# Patient Record
Sex: Male | Born: 1945
Health system: Southern US, Community
[De-identification: ages and names within clinical notes are randomized; demographics above are authoritative.]

## PROBLEM LIST (undated history)

## (undated) DIAGNOSIS — D696 Thrombocytopenia, unspecified: Secondary | ICD-10-CM

## (undated) DIAGNOSIS — G5601 Carpal tunnel syndrome, right upper limb: Secondary | ICD-10-CM

## (undated) DIAGNOSIS — I4891 Unspecified atrial fibrillation: Secondary | ICD-10-CM

## (undated) DIAGNOSIS — I9789 Other postprocedural complications and disorders of the circulatory system, not elsewhere classified: Secondary | ICD-10-CM

## (undated) DIAGNOSIS — I25110 Atherosclerotic heart disease of native coronary artery with unstable angina pectoris: Secondary | ICD-10-CM

## (undated) DIAGNOSIS — Z8679 Personal history of other diseases of the circulatory system: Secondary | ICD-10-CM

## (undated) DIAGNOSIS — R0683 Snoring: Secondary | ICD-10-CM

## (undated) DIAGNOSIS — Q231 Congenital insufficiency of aortic valve: Secondary | ICD-10-CM

## (undated) DIAGNOSIS — M1990 Unspecified osteoarthritis, unspecified site: Secondary | ICD-10-CM

## (undated) DIAGNOSIS — Z951 Presence of aortocoronary bypass graft: Secondary | ICD-10-CM

## (undated) DIAGNOSIS — K5792 Diverticulitis of intestine, part unspecified, without perforation or abscess without bleeding: Secondary | ICD-10-CM

## (undated) DIAGNOSIS — E119 Type 2 diabetes mellitus without complications: Secondary | ICD-10-CM

## (undated) DIAGNOSIS — K219 Gastro-esophageal reflux disease without esophagitis: Secondary | ICD-10-CM

## (undated) DIAGNOSIS — I10 Essential (primary) hypertension: Secondary | ICD-10-CM

## (undated) DIAGNOSIS — Z9889 Other specified postprocedural states: Secondary | ICD-10-CM

## (undated) DIAGNOSIS — N400 Enlarged prostate without lower urinary tract symptoms: Secondary | ICD-10-CM

## (undated) DIAGNOSIS — M109 Gout, unspecified: Secondary | ICD-10-CM

## (undated) DIAGNOSIS — G2581 Restless legs syndrome: Secondary | ICD-10-CM

## (undated) DIAGNOSIS — E785 Hyperlipidemia, unspecified: Secondary | ICD-10-CM

## (undated) DIAGNOSIS — I350 Nonrheumatic aortic (valve) stenosis: Secondary | ICD-10-CM

## (undated) DIAGNOSIS — Q2381 Bicuspid aortic valve: Secondary | ICD-10-CM

## (undated) DIAGNOSIS — Z953 Presence of xenogenic heart valve: Secondary | ICD-10-CM

## (undated) DIAGNOSIS — R011 Cardiac murmur, unspecified: Secondary | ICD-10-CM

## (undated) DIAGNOSIS — I712 Thoracic aortic aneurysm, without rupture: Secondary | ICD-10-CM

## (undated) HISTORY — PX: TONSILLECTOMY: SUR1361

## (undated) HISTORY — DX: Carpal tunnel syndrome, right upper limb: G56.01

## (undated) HISTORY — PX: TRIGGER FINGER RELEASE: SHX641

## (undated) HISTORY — PX: WRIST FRACTURE SURGERY: SHX121

## (undated) HISTORY — PX: COLONOSCOPY: SHX174

## (undated) HISTORY — DX: Diverticulitis of intestine, part unspecified, without perforation or abscess without bleeding: K57.92

## (undated) HISTORY — PX: FRACTURE SURGERY: SHX138

---

## 1976-11-23 HISTORY — PX: CATARACT EXTRACTION W/ INTRAOCULAR LENS  IMPLANT, BILATERAL: SHX1307

## 1996-11-23 HISTORY — PX: INGUINAL HERNIA REPAIR: SUR1180

## 2003-11-24 HISTORY — PX: TRANSURETHRAL RESECTION OF PROSTATE: SHX73

## 2004-11-23 HISTORY — PX: SHOULDER OPEN ROTATOR CUFF REPAIR: SHX2407

## 2005-11-23 HISTORY — PX: PENECTOMY: SHX741

## 2006-10-08 ENCOUNTER — Ambulatory Visit (HOSPITAL_BASED_OUTPATIENT_CLINIC_OR_DEPARTMENT_OTHER): Admission: RE | Admit: 2006-10-08 | Discharge: 2006-10-08 | Payer: Self-pay | Admitting: Urology

## 2006-11-23 HISTORY — PX: LIPOMA EXCISION: SHX5283

## 2007-11-24 HISTORY — PX: PENILE PROSTHESIS IMPLANT: SHX240

## 2008-02-22 ENCOUNTER — Ambulatory Visit (HOSPITAL_BASED_OUTPATIENT_CLINIC_OR_DEPARTMENT_OTHER): Admission: RE | Admit: 2008-02-22 | Discharge: 2008-02-22 | Payer: Self-pay | Admitting: Urology

## 2011-04-07 NOTE — Op Note (Signed)
NAME:  Scott [name], Scott [name]              ACCOUNT NO.:  [id number]   MEDICAL RECORD NO.:  [id number]          PATIENT TYPE:  AMB   LOCATION:  [facility]                         FACILITY:  [facility]   PHYSICIAN:  Mark C. Vernie Ammons, M.D.  DATE OF BIRTH:  [birth year]   DATE OF PROCEDURE:  02/22/2008  DATE OF DISCHARGE:                               OPERATIVE REPORT   PREOPERATIVE DIAGNOSES:  1. Organic erectile dysfunction.  2. Peyronie's disease.  3. Right hydrocele.   POSTOPERATIVE DIAGNOSES:  1. Organic erectile dysfunction.  2. Peyronie's disease.  3. Right hydrocele.   PROCEDURE:  1. Right hydrocelectomy.  2. Insertion of three-piece inflatable penile prosthesis.   SURGEON:  Mark C. Vernie Ammons, M.D.   ANESTHESIA:  General.   DRAINS:  A 16-French Foley catheter.   BLOOD LOSS:  Approximately 100 mL.   DEVICE:  Titan three-piece inflatable penile prosthesis with 16 cm  cylinders and 3 cm rear tip extenders on each side and a 75 mL  reservoir.   COMPLICATIONS:  None.   INDICATIONS:  The patient is a 65 year old white male who has organic  erectile dysfunction.  He has tried various nonsurgical methods without  success in achieving a satisfactory erection.  He has undergone prior  surgery for Peyronie's disease with a plaque incision and patch graft  and desires a penile prosthesis at this time.  He understands the risks,  complications and alternatives and elected to proceed.   DESCRIPTION OF OPERATION:  After informed consent, the patient was  brought to the major OR, placed on the table, administered general  anesthesia, and then the genitalia was sterilely prepped and draped.  He  received intravenous antibiotics preoperatively.  An official time-out  was then performed.   A 16-French Foley catheter was placed in the bladder and midline median  raphe scrotal incision was then made and carried down to expose the  hydrocele sac on the right-hand side.  This was dissected  circumferentially and delivered through the scrotal incision and opened.  Clear amber fluid was drained and the excess parietal tunica vaginalis  was excised.  I then fulgurated the edges and oversewed the edges with  running 3-0 chromic suture in a locking fashion.  This controlled any  bleeding.  I then replaced the right testicle in its normal anatomic  position.   I then dissected the tissue from the each side of the corpora, both on  right and left sides and placed stay sutures in the corpora.  Corporotomies were then made in the corpora and lengthened with the  curved Mayo scissors.  Sissy Hoff sounds were then used to dilate the  corpora both proximally and distally from 9 up to 14 cm without  difficulty.  This was performed on first the right and then the left  side.  Both sides dilated easily despite the fact that he has a history  of Peyronie's disease.  I then measured each corpus cavernosum and both  were found to be 11 cm proximally and 8 cm distally.  Both corpora were  then irrigated copiously with antibiotic solution.   A  75 mL reservoir was then selected and I dissected up to the area of  the external inguinal ring on the right-hand side.  The cord was  palpated and noted to be lateral to the opening of the medial aspect of  the ring which was pierced with a hemostat and developed behind the  symphysis pubis in the space of Retzius using blunt technique after  completely emptying the bladder through the Foley catheter.  I then  placed the 75 mL reservoir within this space after irrigating it with  antibiotic solution and filled the reservoir was 75 mL of sterile saline  and placed a shod stat on the tubing.   Attention was then redirected to the corporotomies and the [facility]  inserter was then used to place the suture through the distal aspect of  the penis through the corpora on each side and 3 cm rear tip extenders  were then attached to the cylinders, and these were  placed proximally on  each side and then the cylinders were directed distally using the  affixed string into the distal aspect of the penis.  I then filled the  device completely to its maximum and noted a good straight erection with  no kinking of the cylinders and both cylinders being noted to be located  in an equidistant position just beneath the glans.  I irrigated again  with antibiotic solution and closed the corporotomies with running 3-0  PDS suture.  I then attached the black tubing from the pump to the black  tubing of the reservoir in the standard fashion using the supplied  coupling device and excising the excess tubing in doing this.  I then  cycled the device a second time with the pump and found it to work  properly.  I then irrigated again with antibiotic solution.   A scrotal pocket was then developed using blunt technique in the right  hemiscrotum and the pump was placed in this location.  I then irrigated  one final time with antibiotic solution and closed the incision by first  reapproximating the deep scrotal tissue with running 3-0 chromic suture.  The superficial scrotal tissue was then reapproximated for a second  layer using running 3-0 chromic and the skin reapproximated with running  3-0 chromic as well.  The skin was sealed with Dermabond and fluffed  Kerlix and a scrotal support were applied.  The patient was awakened and  taken to recovery room in stable satisfactory condition.  He tolerated  the procedure well.  There were no intraoperative complications.   He will be given a prescription for 40 Tylox upon discharge, as well as  maintained on doxycycline 100 mg b.i.d. for 5 days.  He will be observed  overnight and discharged in the morning, and his Foley catheter will be  removed at that time.      Mark C. Vernie Ammons, M.D.  Electronically Signed     MCO/MEDQ  D:  02/22/2008  T:  02/23/2008  Job:  [id number]

## 2011-04-10 NOTE — Op Note (Signed)
NAMEDILLIAN, Scott [name]              ACCOUNT NO.:  [id number]   MEDICAL RECORD NO.:  [id number]          PATIENT TYPE:  AMB   LOCATION:  [facility]                         FACILITY:  [facility]   PHYSICIAN:  Mark C. Vernie Ammons, M.D.  DATE OF BIRTH:  Apr 18, 1946   DATE OF PROCEDURE:  10/08/2006  DATE OF DISCHARGE:                                 OPERATIVE REPORT   PREOPERATIVE DIAGNOSIS:  Penile curvature secondary to Peyronie's plaque.   POSTOPERATIVE DIAGNOSIS:  Penile curvature secondary to Peyronie's plaque.   PROCEDURE:  Peyronie's plaque incision and patch grafting.   SURGEON:  Dr. Vernie Ammons.   ASSISTANTS:  Dr. Sherron Monday and Dr. Wilson Singer   ANESTHESIA:  General with local supplement.   SPECIMENS:  None.   DRAINS:  None.   BLOOD LOSS:  Minimal.   COMPLICATIONS:  None.   INDICATIONS:  The patient is a 65 year old white male who has had dorsal  penile curvature secondary Peyronie's disease for approximately 1 year.  He  has no pain with erections.  He is still able to achieve a good erection but  requires Viagra.  He states the curvature has remained stable.  He is unable  to engage in intercourse with his wife due the degree of curvature which he  describes as nearly bending back to touch the abdomen.  He has also noted  preoperatively slight decrease in sensation in the penis which has resulted  in decreased ability achieving a climax.  The risks, complications, and  alternatives have been fully discussed with the patient.  He understands and  has elected to proceed with surgery.   DESCRIPTION OF OPERATION:  After informed consent, the patient brought to  the major OR, placed on the table, administered general anesthesia, after  which the genitalia was sterilely prepped and draped.  A 14-French Foley  catheter was placed in the urethra, and a circumcising incision was then  made circumferentially approximately 5 mm proximal to the corona circ, and  the penile shaft skin was then  dissected off of the penis back down to the  base of the penis.  I then placed a 1/2 inch Penrose drain around the base  of the penis as a penile tourniquet and performed an artificial erection by  using a 21 gauge butterfly needle, placed it in the corpora, and injected  sterile injectable saline into the penis.  In doing this, I noted the severe  curvature which was noted to result in dorsal curvature of the penis in the  midline, and the maximum point of curvature was then noted several  centimeters back from the glans.  This was marked with a pen.   I then incised the overlying fascia down to and incised the overlying Buck's  fascia down to the corpora at about the 3 and 9 o'clock positions several  centimeters back and extended this on up to where the circumcising incision  was then made.  Maintaining this tissue intact, I then dissected along the  corporal bodies and freed up the neurovascular bundle without difficulty,  maintaining this intact and cauterizing small emissary vessels as they  were  encountered.   I then performed an artificial erection in an identical fashion, as noted  previously.  In doing this, I noted again the area of greatest curvature.  This corresponded to the area where the Peyronie's plaque was easily  palpable.  This was marked with a marker.   An incision was then made in the plaque transversely at the location of its  greatest curvature.  This was extended laterally on either side of the  midline.  The total length of the transverse incision was approximately 4  cm.  An H incision was then made by making incisions parallel to the shaft  at each end of the incision approximately 2 cm in length on each side.  The  tips of the H then were identified and excised.  I then dissected between  the spongy tissue of the corporal bodies about 2 mm in on each side to free  up the incision.  In doing this, this opened up a wide 2 cm width defect  that was 2 cm  distal to proximal width.  Laterally it measured 4 cm.  I then  chose a portion of pericardium and cut a portion just a little bit larger  than 2 x 4 cm.  This was laid in the defect.  It was then secured at all 4  corners with interrupted 4-0 PDS suture.  I then used 4-0 PDS in a running  fashion to approximate the edge of the corporal bodies to the graft  material.  After this was completely sewn in, I then performed a final  artificial erection and noted the penis to be straight.   The penile tourniquet which had been in place during the operation was  removed and good blood flow return to the penis.  Bleeding points were  cauterized.  I then reapproximated Buck's fascia on each side using running  4-0 chromic suture.  A dorsal penile block was then performed using 0.25%  plain Marcaine, and the circumferential incision was then reapproximated  with running 4-0 chromic suture.  A Telfa dressing was placed  circumferentially around the incision, and Kerlix was loosely wrapped around  the penis and secured.  The patient was awakened and taken to the recovery  room in stable satisfactory condition.  He tolerated the procedure well.  There were no intraoperative complications.  He will be given a prescription  for Tylox #40 and take Cipro 500 mg b.i.d. x5 days.  He will return for  recheck to my office in 10 days.      Mark C. Vernie Ammons, M.D.  Electronically Signed     MCO/MEDQ  D:  10/08/2006  T:  10/08/2006  Job:  [id number]

## 2011-08-18 LAB — POCT I-STAT 4, (NA,K, GLUC, HGB,HCT)
Glucose, Bld: 110 — ABNORMAL HIGH
HCT: 45
Hemoglobin: 15.3
Operator id: 114531
Potassium: 4.2
Sodium: 141

## 2011-11-26 DIAGNOSIS — M6530 Trigger finger, unspecified finger: Secondary | ICD-10-CM | POA: Diagnosis not present

## 2011-12-29 DIAGNOSIS — I889 Nonspecific lymphadenitis, unspecified: Secondary | ICD-10-CM | POA: Diagnosis not present

## 2011-12-30 DIAGNOSIS — I10 Essential (primary) hypertension: Secondary | ICD-10-CM | POA: Diagnosis not present

## 2011-12-30 DIAGNOSIS — E7800 Pure hypercholesterolemia, unspecified: Secondary | ICD-10-CM | POA: Diagnosis not present

## 2011-12-30 DIAGNOSIS — E559 Vitamin D deficiency, unspecified: Secondary | ICD-10-CM | POA: Diagnosis not present

## 2011-12-30 DIAGNOSIS — E119 Type 2 diabetes mellitus without complications: Secondary | ICD-10-CM | POA: Diagnosis not present

## 2011-12-30 DIAGNOSIS — E538 Deficiency of other specified B group vitamins: Secondary | ICD-10-CM | POA: Diagnosis not present

## 2011-12-30 DIAGNOSIS — M109 Gout, unspecified: Secondary | ICD-10-CM | POA: Diagnosis not present

## 2012-01-04 DIAGNOSIS — I10 Essential (primary) hypertension: Secondary | ICD-10-CM | POA: Diagnosis not present

## 2012-01-04 DIAGNOSIS — S139XXA Sprain of joints and ligaments of unspecified parts of neck, initial encounter: Secondary | ICD-10-CM | POA: Diagnosis not present

## 2012-01-04 DIAGNOSIS — M109 Gout, unspecified: Secondary | ICD-10-CM | POA: Diagnosis not present

## 2012-01-04 DIAGNOSIS — G2581 Restless legs syndrome: Secondary | ICD-10-CM | POA: Diagnosis not present

## 2012-01-04 DIAGNOSIS — E7800 Pure hypercholesterolemia, unspecified: Secondary | ICD-10-CM | POA: Diagnosis not present

## 2012-01-04 DIAGNOSIS — G619 Inflammatory polyneuropathy, unspecified: Secondary | ICD-10-CM | POA: Diagnosis not present

## 2012-01-04 DIAGNOSIS — E119 Type 2 diabetes mellitus without complications: Secondary | ICD-10-CM | POA: Diagnosis not present

## 2012-01-04 DIAGNOSIS — G622 Polyneuropathy due to other toxic agents: Secondary | ICD-10-CM | POA: Diagnosis not present

## 2012-01-04 DIAGNOSIS — E781 Pure hyperglyceridemia: Secondary | ICD-10-CM | POA: Diagnosis not present

## 2012-07-04 DIAGNOSIS — N400 Enlarged prostate without lower urinary tract symptoms: Secondary | ICD-10-CM | POA: Diagnosis not present

## 2012-07-04 DIAGNOSIS — E7800 Pure hypercholesterolemia, unspecified: Secondary | ICD-10-CM | POA: Diagnosis not present

## 2012-07-04 DIAGNOSIS — E119 Type 2 diabetes mellitus without complications: Secondary | ICD-10-CM | POA: Diagnosis not present

## 2012-07-12 DIAGNOSIS — M109 Gout, unspecified: Secondary | ICD-10-CM | POA: Diagnosis not present

## 2012-07-12 DIAGNOSIS — G2581 Restless legs syndrome: Secondary | ICD-10-CM | POA: Diagnosis not present

## 2012-07-12 DIAGNOSIS — E781 Pure hyperglyceridemia: Secondary | ICD-10-CM | POA: Diagnosis not present

## 2012-07-12 DIAGNOSIS — E119 Type 2 diabetes mellitus without complications: Secondary | ICD-10-CM | POA: Diagnosis not present

## 2012-07-12 DIAGNOSIS — E7800 Pure hypercholesterolemia, unspecified: Secondary | ICD-10-CM | POA: Diagnosis not present

## 2012-07-12 DIAGNOSIS — I10 Essential (primary) hypertension: Secondary | ICD-10-CM | POA: Diagnosis not present

## 2012-09-06 DIAGNOSIS — Z23 Encounter for immunization: Secondary | ICD-10-CM | POA: Diagnosis not present

## 2012-09-27 DIAGNOSIS — K5732 Diverticulitis of large intestine without perforation or abscess without bleeding: Secondary | ICD-10-CM | POA: Diagnosis not present

## 2012-10-25 DIAGNOSIS — M76899 Other specified enthesopathies of unspecified lower limb, excluding foot: Secondary | ICD-10-CM | POA: Diagnosis not present

## 2012-10-25 DIAGNOSIS — S83289A Other tear of lateral meniscus, current injury, unspecified knee, initial encounter: Secondary | ICD-10-CM | POA: Diagnosis not present

## 2012-11-01 DIAGNOSIS — R197 Diarrhea, unspecified: Secondary | ICD-10-CM | POA: Diagnosis not present

## 2012-11-01 DIAGNOSIS — K5289 Other specified noninfective gastroenteritis and colitis: Secondary | ICD-10-CM | POA: Diagnosis not present

## 2012-12-06 DIAGNOSIS — M76899 Other specified enthesopathies of unspecified lower limb, excluding foot: Secondary | ICD-10-CM | POA: Diagnosis not present

## 2012-12-20 DIAGNOSIS — D235 Other benign neoplasm of skin of trunk: Secondary | ICD-10-CM | POA: Diagnosis not present

## 2012-12-20 DIAGNOSIS — L570 Actinic keratosis: Secondary | ICD-10-CM | POA: Diagnosis not present

## 2012-12-20 DIAGNOSIS — L219 Seborrheic dermatitis, unspecified: Secondary | ICD-10-CM | POA: Diagnosis not present

## 2012-12-20 DIAGNOSIS — L819 Disorder of pigmentation, unspecified: Secondary | ICD-10-CM | POA: Diagnosis not present

## 2012-12-20 DIAGNOSIS — D485 Neoplasm of uncertain behavior of skin: Secondary | ICD-10-CM | POA: Diagnosis not present

## 2012-12-28 DIAGNOSIS — E559 Vitamin D deficiency, unspecified: Secondary | ICD-10-CM | POA: Diagnosis not present

## 2012-12-28 DIAGNOSIS — I10 Essential (primary) hypertension: Secondary | ICD-10-CM | POA: Diagnosis not present

## 2012-12-28 DIAGNOSIS — E538 Deficiency of other specified B group vitamins: Secondary | ICD-10-CM | POA: Diagnosis not present

## 2012-12-28 DIAGNOSIS — E7800 Pure hypercholesterolemia, unspecified: Secondary | ICD-10-CM | POA: Diagnosis not present

## 2012-12-28 DIAGNOSIS — E119 Type 2 diabetes mellitus without complications: Secondary | ICD-10-CM | POA: Diagnosis not present

## 2013-01-03 DIAGNOSIS — M76899 Other specified enthesopathies of unspecified lower limb, excluding foot: Secondary | ICD-10-CM | POA: Diagnosis not present

## 2013-01-10 DIAGNOSIS — M109 Gout, unspecified: Secondary | ICD-10-CM | POA: Diagnosis not present

## 2013-01-10 DIAGNOSIS — N400 Enlarged prostate without lower urinary tract symptoms: Secondary | ICD-10-CM | POA: Diagnosis not present

## 2013-01-10 DIAGNOSIS — G2581 Restless legs syndrome: Secondary | ICD-10-CM | POA: Diagnosis not present

## 2013-01-10 DIAGNOSIS — I10 Essential (primary) hypertension: Secondary | ICD-10-CM | POA: Diagnosis not present

## 2013-01-10 DIAGNOSIS — E119 Type 2 diabetes mellitus without complications: Secondary | ICD-10-CM | POA: Diagnosis not present

## 2013-01-10 DIAGNOSIS — E7800 Pure hypercholesterolemia, unspecified: Secondary | ICD-10-CM | POA: Diagnosis not present

## 2013-01-10 DIAGNOSIS — E781 Pure hyperglyceridemia: Secondary | ICD-10-CM | POA: Diagnosis not present

## 2013-01-17 DIAGNOSIS — H2510 Age-related nuclear cataract, unspecified eye: Secondary | ICD-10-CM | POA: Diagnosis not present

## 2013-04-24 DIAGNOSIS — N400 Enlarged prostate without lower urinary tract symptoms: Secondary | ICD-10-CM | POA: Diagnosis not present

## 2013-04-24 DIAGNOSIS — M545 Low back pain: Secondary | ICD-10-CM | POA: Diagnosis not present

## 2013-05-02 DIAGNOSIS — N400 Enlarged prostate without lower urinary tract symptoms: Secondary | ICD-10-CM | POA: Diagnosis not present

## 2013-07-11 DIAGNOSIS — I10 Essential (primary) hypertension: Secondary | ICD-10-CM | POA: Diagnosis not present

## 2013-07-11 DIAGNOSIS — E119 Type 2 diabetes mellitus without complications: Secondary | ICD-10-CM | POA: Diagnosis not present

## 2013-07-11 DIAGNOSIS — E7800 Pure hypercholesterolemia, unspecified: Secondary | ICD-10-CM | POA: Diagnosis not present

## 2013-07-11 DIAGNOSIS — N400 Enlarged prostate without lower urinary tract symptoms: Secondary | ICD-10-CM | POA: Diagnosis not present

## 2013-07-18 DIAGNOSIS — N400 Enlarged prostate without lower urinary tract symptoms: Secondary | ICD-10-CM | POA: Diagnosis not present

## 2013-07-18 DIAGNOSIS — E119 Type 2 diabetes mellitus without complications: Secondary | ICD-10-CM | POA: Diagnosis not present

## 2013-07-18 DIAGNOSIS — E7800 Pure hypercholesterolemia, unspecified: Secondary | ICD-10-CM | POA: Diagnosis not present

## 2013-07-18 DIAGNOSIS — M109 Gout, unspecified: Secondary | ICD-10-CM | POA: Diagnosis not present

## 2013-07-18 DIAGNOSIS — I10 Essential (primary) hypertension: Secondary | ICD-10-CM | POA: Diagnosis not present

## 2013-07-18 DIAGNOSIS — E781 Pure hyperglyceridemia: Secondary | ICD-10-CM | POA: Diagnosis not present

## 2013-07-18 DIAGNOSIS — G2581 Restless legs syndrome: Secondary | ICD-10-CM | POA: Diagnosis not present

## 2013-09-01 DIAGNOSIS — Z23 Encounter for immunization: Secondary | ICD-10-CM | POA: Diagnosis not present

## 2013-09-05 DIAGNOSIS — G5600 Carpal tunnel syndrome, unspecified upper limb: Secondary | ICD-10-CM | POA: Diagnosis not present

## 2013-09-19 DIAGNOSIS — G5600 Carpal tunnel syndrome, unspecified upper limb: Secondary | ICD-10-CM | POA: Diagnosis not present

## 2013-09-26 DIAGNOSIS — G5600 Carpal tunnel syndrome, unspecified upper limb: Secondary | ICD-10-CM | POA: Diagnosis not present

## 2013-11-18 DIAGNOSIS — J111 Influenza due to unidentified influenza virus with other respiratory manifestations: Secondary | ICD-10-CM | POA: Diagnosis not present

## 2013-11-21 ENCOUNTER — Encounter (HOSPITAL_BASED_OUTPATIENT_CLINIC_OR_DEPARTMENT_OTHER): Payer: Self-pay | Admitting: *Deleted

## 2013-11-21 NOTE — Progress Notes (Signed)
11/21/13 1633  OBSTRUCTIVE SLEEP APNEA  Have you ever been diagnosed with sleep apnea through a sleep study? No  Do you snore loudly (loud enough to be heard through closed doors)?  1  Do you often feel tired, fatigued, or sleepy during the daytime? 1  Has anyone observed you stop breathing during your sleep? 1  Do you have, or are you being treated for high blood pressure? 1  BMI more than 35 kg/m2? 0  Age over 67 years old? 1  Gender: 1  Obstructive Sleep Apnea Score 6  Score 4 or greater  Results sent to PCP

## 2013-11-21 NOTE — Progress Notes (Signed)
Pt snores and may have apnea-he has never had to see cardiology but was sent for echo several yr ago for a soft murmur- Denies any resp problems Will need ekg istat

## 2013-11-28 ENCOUNTER — Encounter (HOSPITAL_BASED_OUTPATIENT_CLINIC_OR_DEPARTMENT_OTHER): Payer: Medicare Other | Admitting: Anesthesiology

## 2013-11-28 ENCOUNTER — Encounter (HOSPITAL_BASED_OUTPATIENT_CLINIC_OR_DEPARTMENT_OTHER)
Admission: RE | Disposition: A | Discharge status: Home / Self Care | Payer: Self-pay | Source: Ambulatory Visit | Attending: Orthopedic Surgery

## 2013-11-28 ENCOUNTER — Other Ambulatory Visit: Payer: Self-pay

## 2013-11-28 ENCOUNTER — Encounter (HOSPITAL_BASED_OUTPATIENT_CLINIC_OR_DEPARTMENT_OTHER): Payer: Self-pay | Admitting: Anesthesiology

## 2013-11-28 ENCOUNTER — Ambulatory Visit (HOSPITAL_BASED_OUTPATIENT_CLINIC_OR_DEPARTMENT_OTHER): Payer: Medicare Other | Admitting: Anesthesiology

## 2013-11-28 ENCOUNTER — Encounter (HOSPITAL_BASED_OUTPATIENT_CLINIC_OR_DEPARTMENT_OTHER): Payer: Self-pay | Admitting: *Deleted

## 2013-11-28 ENCOUNTER — Other Ambulatory Visit: Payer: Self-pay | Admitting: Physician Assistant

## 2013-11-28 ENCOUNTER — Encounter: Payer: Self-pay | Admitting: Physician Assistant

## 2013-11-28 ENCOUNTER — Ambulatory Visit (HOSPITAL_BASED_OUTPATIENT_CLINIC_OR_DEPARTMENT_OTHER)
Admission: RE | Admit: 2013-11-28 | Discharge: 2013-11-28 | Disposition: A | Discharge status: Home / Self Care | Payer: Medicare Other | Source: Ambulatory Visit | Attending: Orthopedic Surgery | Admitting: Orthopedic Surgery

## 2013-11-28 DIAGNOSIS — N401 Enlarged prostate with lower urinary tract symptoms: Secondary | ICD-10-CM | POA: Diagnosis not present

## 2013-11-28 DIAGNOSIS — I10 Essential (primary) hypertension: Secondary | ICD-10-CM | POA: Diagnosis not present

## 2013-11-28 DIAGNOSIS — M109 Gout, unspecified: Secondary | ICD-10-CM | POA: Insufficient documentation

## 2013-11-28 DIAGNOSIS — R351 Nocturia: Secondary | ICD-10-CM | POA: Insufficient documentation

## 2013-11-28 DIAGNOSIS — G5600 Carpal tunnel syndrome, unspecified upper limb: Secondary | ICD-10-CM | POA: Insufficient documentation

## 2013-11-28 DIAGNOSIS — M65341 Trigger finger, right ring finger: Secondary | ICD-10-CM | POA: Insufficient documentation

## 2013-11-28 DIAGNOSIS — G2581 Restless legs syndrome: Secondary | ICD-10-CM | POA: Diagnosis not present

## 2013-11-28 DIAGNOSIS — R0683 Snoring: Secondary | ICD-10-CM | POA: Insufficient documentation

## 2013-11-28 DIAGNOSIS — M6530 Trigger finger, unspecified finger: Secondary | ICD-10-CM | POA: Diagnosis not present

## 2013-11-28 DIAGNOSIS — G5601 Carpal tunnel syndrome, right upper limb: Secondary | ICD-10-CM

## 2013-11-28 DIAGNOSIS — N138 Other obstructive and reflux uropathy: Secondary | ICD-10-CM | POA: Insufficient documentation

## 2013-11-28 DIAGNOSIS — E785 Hyperlipidemia, unspecified: Secondary | ICD-10-CM | POA: Diagnosis not present

## 2013-11-28 DIAGNOSIS — M1990 Unspecified osteoarthritis, unspecified site: Secondary | ICD-10-CM | POA: Insufficient documentation

## 2013-11-28 DIAGNOSIS — Z973 Presence of spectacles and contact lenses: Secondary | ICD-10-CM | POA: Insufficient documentation

## 2013-11-28 HISTORY — PX: TRIGGER FINGER RELEASE: SHX641

## 2013-11-28 HISTORY — DX: Unspecified osteoarthritis, unspecified site: M19.90

## 2013-11-28 HISTORY — DX: Essential (primary) hypertension: I10

## 2013-11-28 HISTORY — DX: Restless legs syndrome: G25.81

## 2013-11-28 HISTORY — DX: Snoring: R06.83

## 2013-11-28 HISTORY — DX: Gout, unspecified: M10.9

## 2013-11-28 HISTORY — DX: Benign prostatic hyperplasia without lower urinary tract symptoms: N40.0

## 2013-11-28 HISTORY — DX: Hyperlipidemia, unspecified: E78.5

## 2013-11-28 HISTORY — PX: CARPAL TUNNEL RELEASE: SHX101

## 2013-11-28 LAB — POCT I-STAT, CHEM 8
BUN: 16 mg/dL (ref 6–23)
CHLORIDE: 105 meq/L (ref 96–112)
Calcium, Ion: 1.22 mmol/L (ref 1.13–1.30)
Creatinine, Ser: 0.8 mg/dL (ref 0.50–1.35)
Glucose, Bld: 121 mg/dL — ABNORMAL HIGH (ref 70–99)
HEMATOCRIT: 46 % (ref 39.0–52.0)
Hemoglobin: 15.6 g/dL (ref 13.0–17.0)
POTASSIUM: 3.7 meq/L (ref 3.7–5.3)
Sodium: 140 mEq/L (ref 137–147)
TCO2: 24 mmol/L (ref 0–100)

## 2013-11-28 SURGERY — CARPAL TUNNEL RELEASE
Anesthesia: General | Site: Wrist | Laterality: Right

## 2013-11-28 MED ORDER — CHLORHEXIDINE GLUCONATE 4 % EX LIQD: 60.0000 mL | Freq: Once | CUTANEOUS | Status: DC

## 2013-11-28 MED ORDER — MIDAZOLAM HCL 2 MG/2ML IJ SOLN
INTRAMUSCULAR | Status: AC
  Filled 2013-11-28: qty 2

## 2013-11-28 MED ORDER — FENTANYL CITRATE 0.05 MG/ML IJ SOLN: 50.0000 ug | Freq: Once | INTRAMUSCULAR | Status: DC

## 2013-11-28 MED ORDER — 0.9 % SODIUM CHLORIDE (POUR BTL) OPTIME
TOPICAL | Status: DC | PRN
  Administered 2013-11-28: 250 mL

## 2013-11-28 MED ORDER — HYDROCODONE-ACETAMINOPHEN 5-325 MG PO TABS: ORAL_TABLET | ORAL | Status: DC

## 2013-11-28 MED ORDER — PROPOFOL 10 MG/ML IV BOLUS
INTRAVENOUS | Status: AC
  Filled 2013-11-28: qty 20

## 2013-11-28 MED ORDER — OXYCODONE HCL 5 MG PO TABS: 5.0000 mg | ORAL_TABLET | Freq: Once | ORAL | Status: DC | PRN

## 2013-11-28 MED ORDER — MIDAZOLAM HCL 5 MG/5ML IJ SOLN
INTRAMUSCULAR | Status: DC | PRN
  Administered 2013-11-28: 2 mg via INTRAVENOUS

## 2013-11-28 MED ORDER — HYDROMORPHONE HCL PF 1 MG/ML IJ SOLN: 0.2500 mg | INTRAMUSCULAR | Status: DC | PRN

## 2013-11-28 MED ORDER — LIDOCAINE HCL (CARDIAC) 20 MG/ML IV SOLN
INTRAVENOUS | Status: DC | PRN
  Administered 2013-11-28: 80 mg via INTRAVENOUS

## 2013-11-28 MED ORDER — OXYCODONE HCL 5 MG/5ML PO SOLN: 5.0000 mg | Freq: Once | ORAL | Status: DC | PRN

## 2013-11-28 MED ORDER — FENTANYL CITRATE 0.05 MG/ML IJ SOLN
INTRAMUSCULAR | Status: AC
  Filled 2013-11-28: qty 4

## 2013-11-28 MED ORDER — ONDANSETRON HCL 4 MG/2ML IJ SOLN
INTRAMUSCULAR | Status: DC | PRN
  Administered 2013-11-28: 4 mg via INTRAVENOUS

## 2013-11-28 MED ORDER — LACTATED RINGERS IV SOLN: INTRAVENOUS | Status: DC

## 2013-11-28 MED ORDER — BUPIVACAINE HCL (PF) 0.25 % IJ SOLN
INTRAMUSCULAR | Status: DC | PRN
  Administered 2013-11-28: 10 mL

## 2013-11-28 MED ORDER — LACTATED RINGERS IV SOLN
INTRAVENOUS | Status: DC
  Administered 2013-11-28 (×2): via INTRAVENOUS

## 2013-11-28 MED ORDER — FENTANYL CITRATE 0.05 MG/ML IJ SOLN
INTRAMUSCULAR | Status: DC | PRN
  Administered 2013-11-28 (×2): 50 ug via INTRAVENOUS

## 2013-11-28 MED ORDER — VANCOMYCIN HCL IN DEXTROSE 1-5 GM/200ML-% IV SOLN
INTRAVENOUS | Status: AC
  Filled 2013-11-28: qty 200

## 2013-11-28 MED ORDER — PROPOFOL 10 MG/ML IV BOLUS
INTRAVENOUS | Status: DC | PRN
  Administered 2013-11-28: 150 mg via INTRAVENOUS

## 2013-11-28 MED ORDER — VANCOMYCIN HCL IN DEXTROSE 1-5 GM/200ML-% IV SOLN
1000.0000 mg | INTRAVENOUS | Status: AC
  Administered 2013-11-28: 1000 mg via INTRAVENOUS

## 2013-11-28 MED ORDER — DEXAMETHASONE SODIUM PHOSPHATE 4 MG/ML IJ SOLN
INTRAMUSCULAR | Status: DC | PRN
  Administered 2013-11-28: 10 mg via INTRAVENOUS

## 2013-11-28 MED ORDER — MIDAZOLAM HCL 2 MG/2ML IJ SOLN: 1.0000 mg | INTRAMUSCULAR | Status: DC | PRN

## 2013-11-28 MED ORDER — FENTANYL CITRATE 0.05 MG/ML IJ SOLN: 50.0000 ug | INTRAMUSCULAR | Status: DC | PRN

## 2013-11-28 MED ORDER — PROMETHAZINE HCL 25 MG/ML IJ SOLN: 6.2500 mg | INTRAMUSCULAR | Status: DC | PRN

## 2013-11-28 SURGICAL SUPPLY — 51 items
BANDAGE ELASTIC 3 VELCRO ST LF (GAUZE/BANDAGES/DRESSINGS) ×4 IMPLANT
BLADE MINI RND TIP GREEN BEAV (BLADE) IMPLANT
BLADE SURG 15 STRL LF DISP TIS (BLADE) ×4 IMPLANT
BLADE SURG 15 STRL SS (BLADE) ×8
BNDG CMPR 9X4 STRL LF SNTH (GAUZE/BANDAGES/DRESSINGS) ×2
BNDG COHESIVE 3X5 TAN STRL LF (GAUZE/BANDAGES/DRESSINGS) ×4 IMPLANT
BNDG COHESIVE 4X5 TAN STRL (GAUZE/BANDAGES/DRESSINGS) ×4 IMPLANT
BNDG ESMARK 4X9 LF (GAUZE/BANDAGES/DRESSINGS) ×4 IMPLANT
COVER TABLE BACK 60X90 (DRAPES) ×4 IMPLANT
CUFF TOURNIQUET SINGLE 18IN (TOURNIQUET CUFF) ×4 IMPLANT
DRAPE EXTREMITY T 121X128X90 (DRAPE) ×4 IMPLANT
DRAPE SURG 17X23 STRL (DRAPES) ×4 IMPLANT
DRAPE U 20/CS (DRAPES) ×4 IMPLANT
DRAPE U-SHAPE 47X51 STRL (DRAPES) ×4 IMPLANT
DRSG EMULSION OIL 3X3 NADH (GAUZE/BANDAGES/DRESSINGS) IMPLANT
DURAPREP 26ML APPLICATOR (WOUND CARE) ×4 IMPLANT
ELECT NDL TIP 2.8 STRL (NEEDLE) IMPLANT
ELECT NEEDLE TIP 2.8 STRL (NEEDLE) IMPLANT
ELECT REM PT RETURN 9FT ADLT (ELECTROSURGICAL) ×4
ELECTRODE REM PT RTRN 9FT ADLT (ELECTROSURGICAL) ×2 IMPLANT
GAUZE XEROFORM 1X8 LF (GAUZE/BANDAGES/DRESSINGS) ×4 IMPLANT
GLOVE BIO SURGEON STRL SZ7 (GLOVE) ×4 IMPLANT
GLOVE BIOGEL PI IND STRL 7.0 (GLOVE) ×2 IMPLANT
GLOVE BIOGEL PI IND STRL 7.5 (GLOVE) ×2 IMPLANT
GLOVE BIOGEL PI INDICATOR 7.0 (GLOVE) ×6
GLOVE BIOGEL PI INDICATOR 7.5 (GLOVE) ×2
GLOVE SS BIOGEL STRL SZ 7.5 (GLOVE) ×2 IMPLANT
GLOVE SUPERSENSE BIOGEL SZ 7.5 (GLOVE) ×2
GLOVE SURG SS PI 7.0 STRL IVOR (GLOVE) ×8 IMPLANT
GOWN STRL REIN XL XLG (GOWN DISPOSABLE) ×4 IMPLANT
GOWN STRL REUS W/ TWL LRG LVL3 (GOWN DISPOSABLE) ×2 IMPLANT
GOWN STRL REUS W/TWL LRG LVL3 (GOWN DISPOSABLE) ×4
NDL HYPO 25X1 1.5 SAFETY (NEEDLE) ×2 IMPLANT
NEEDLE HYPO 25X1 1.5 SAFETY (NEEDLE) ×4 IMPLANT
NS IRRIG 1000ML POUR BTL (IV SOLUTION) ×4 IMPLANT
PACK BASIN DAY SURGERY FS (CUSTOM PROCEDURE TRAY) ×4 IMPLANT
PAD CAST 3X4 CTTN HI CHSV (CAST SUPPLIES) ×2 IMPLANT
PADDING CAST ABS 4INX4YD NS (CAST SUPPLIES) ×2
PADDING CAST ABS COTTON 4X4 ST (CAST SUPPLIES) ×2 IMPLANT
PADDING CAST COTTON 3X4 STRL (CAST SUPPLIES) ×4
PENCIL BUTTON HOLSTER BLD 10FT (ELECTRODE) ×4 IMPLANT
SPONGE GAUZE 4X4 12PLY (GAUZE/BANDAGES/DRESSINGS) ×4 IMPLANT
SPONGE GAUZE 4X4 12PLY STER LF (GAUZE/BANDAGES/DRESSINGS) ×6 IMPLANT
STOCKINETTE 4X48 STRL (DRAPES) ×4 IMPLANT
SUT ETHILON 4 0 PS 2 18 (SUTURE) ×4 IMPLANT
SUT ETHILON 5 0 P 3 18 (SUTURE)
SUT NYLON ETHILON 5-0 P-3 1X18 (SUTURE) IMPLANT
SYR BULB 3OZ (MISCELLANEOUS) ×4 IMPLANT
SYR CONTROL 10ML LL (SYRINGE) ×4 IMPLANT
TOWEL OR 17X24 6PK STRL BLUE (TOWEL DISPOSABLE) ×8 IMPLANT
UNDERPAD 30X30 INCONTINENT (UNDERPADS AND DIAPERS) ×4 IMPLANT

## 2013-11-28 NOTE — H&P (View-Only) (Signed)
Scott [name] is an 68 y.o. male.   Chief Complaint: right hand pain, numbness, tingling with fourth finger catching HPI: Scott [name] is a 68 year old seen for followup evaluation from his right carpal tunnel syndrome.  He underwent EMG, PNCV's  on 09/19/2013 that revealed moderate to severe right carpal tunnel syndrome.  He continues to have pain and numbness.  He is also having triggering of his right fourth finger.  He has previously undergone trigger finger releases in his right second and third fingers a number of years ago.  Past Medical History  Diagnosis Date  . Hypertension   . BPH (benign prostatic hypertrophy)   . Nocturia   . Gout   . Arthritis   . RLS (restless legs syndrome)   . Snores   . Hyperlipemia   . Wears glasses   . Carpal tunnel syndrome of right wrist   . Trigger ring finger of right hand     Past Surgical History  Procedure Laterality Date  . Tonsillectomy    . Eye surgery  1978    both cataracts  . Hernia repair  1998    rt ing  . Transurethral resection of prostate  2005  . Shoulder arthroscopy w/ rotator cuff repair  2006    right  . Lipoma excision  2008    rt head  . Colonoscopy    . Trigger finger release      several lt and rt hands  . Penile prosthesis implant  2009  . Penectomy  2007    for pernones    Family History  Problem Relation Age of Onset  .      Social History:  reports that he has never smoked. He does not have any smokeless tobacco history on file. He reports that he drinks alcohol. He reports that he does not use illicit drugs.  Allergies:  Allergies  Allergen Reactions  . Doxycycline Hives  . Penicillins Hives  . Sulfa Antibiotics Hives   Current Facility-Administered Medications on File Prior to Visit  Medication Dose Route Frequency Provider Last Rate Last Dose  . fentaNYL (SUBLIMAZE) injection 50-100 mcg  50-100 mcg Intravenous PRN Napoleon Form, MD      . lactated ringers infusion   Intravenous Continuous  Napoleon Form, MD      . midazolam (VERSED) injection 1-2 mg  1-2 mg Intravenous PRN Napoleon Form, MD       Current Outpatient Prescriptions on File Prior to Visit  Medication Sig Dispense Refill  . allopurinol (ZYLOPRIM) 300 MG tablet Take 300 mg by mouth daily.      Marland Kitchen aspirin 81 MG tablet Take 81 mg by mouth daily.      . enalapril (VASOTEC) 20 MG tablet Take 20 mg by mouth 2 (two) times daily.      . finasteride (PROSCAR) 5 MG tablet Take 5 mg by mouth daily.      Marland Kitchen gemfibrozil (LOPID) 600 MG tablet Take 600 mg by mouth 2 (two) times daily before a meal.      . rOPINIRole (REQUIP) 2 MG tablet Take 2 mg by mouth at bedtime. Takes 2      . solifenacin (VESICARE) 10 MG tablet Take by mouth at bedtime.         (Not in a hospital admission)  No results found for this or any previous visit (from the past 48 hour(s)). No results found.  Review of Systems  Constitutional: Negative.   HENT: Negative.  Eyes: Negative.   Cardiovascular: Negative.   Gastrointestinal: Negative.   Genitourinary: Negative.   Skin: Negative.   Neurological: Negative.   Endo/Heme/Allergies: Negative.   Psychiatric/Behavioral: Negative.     Blood pressure 136/86, height 5\' 9"  (1.753 m), weight 95.255 kg (210 lb). Physical Exam  Constitutional: He is oriented to person, place, and time. He appears well-developed and well-nourished.  HENT:  Head: Normocephalic and atraumatic.  Mouth/Throat: Oropharynx is clear and moist.  Eyes: Conjunctivae are normal. Pupils are equal, round, and reactive to light.  Neck: Neck supple.  Cardiovascular: Normal rate.   Murmur heard. Respiratory: Effort normal.  GI: Soft.  Genitourinary:  Not pertinent to current symptomatology therefore not examined.  Musculoskeletal:  Examination of his right hand reveals positive Tinel's, decreased sensation in the median nerve distribution.  Examination of his right fourth finger reveals triggering with full range of motion.  He has  no triggering in his other fingers.    Neurological: He is alert and oriented to person, place, and time.  Skin: Skin is warm and dry.  Psychiatric: He has a normal mood and affect.     Assessment Patient Active Problem List   Diagnosis Date Noted  . Trigger ring finger of right hand   . Wears glasses   . Gout   . Hypertension    Plan I have talked to him about this in detail.  Would recommend with these findings and his persistent pain and disability that we proceed with right carpal tunnel release as well as right fourth trigger finger release.  Risks, complications and benefits of his surgery have been described to him in detail and he understands this completely.   Sharlett Lienemann J 11/28/2013, 10:35 AM

## 2013-11-28 NOTE — Anesthesia Postprocedure Evaluation (Signed)
  Anesthesia Post-op Note  Patient: Scott [name]  Procedure(s) Performed: Procedure(s): RIGHT WRIST CARPAL TUNNEL RELEASE (Right) RIGHT TRIGGER FINGER  RELEASE (TENDON SHEATH INCISION) (Right)  Patient Location: PACU  Anesthesia Type:General  Level of Consciousness: awake  Airway and Oxygen Therapy: Patient Spontanous Breathing  Post-op Pain: mild  Post-op Assessment: Post-op Vital signs reviewed, Patient's Cardiovascular Status Stable, Respiratory Function Stable, Patent Airway, No signs of Nausea or vomiting and Pain level controlled  Post-op Vital Signs: Reviewed and stable  Complications: No apparent anesthesia complications

## 2013-11-28 NOTE — Transfer of Care (Signed)
Immediate Anesthesia Transfer of Care Note  Patient: Scott [name]  Procedure(s) Performed: Procedure(s): RIGHT WRIST CARPAL TUNNEL RELEASE (Right) RIGHT TRIGGER FINGER  RELEASE (TENDON SHEATH INCISION) (Right)  Patient Location: PACU  Anesthesia Type:General  Level of Consciousness: awake, alert  and oriented  Airway & Oxygen Therapy: Patient Spontanous Breathing  Post-op Assessment: Report given to PACU RN and Post -op Vital signs reviewed and stable  Post vital signs: Reviewed and stable  Complications: No apparent anesthesia complications

## 2013-11-28 NOTE — Interval H&P Note (Signed)
History and Physical Interval Note:  11/28/2013 12:05 PM  Scott [name]  has presented today for surgery, with the diagnosis of Right Wrist Carpal Tunnel Syndrome, Right Trfigger Finger  The various methods of treatment have been discussed with the patient and family. After consideration of risks, benefits and other options for treatment, the patient has consented to  Procedure(s): RIGHT WRIST CARPAL TUNNEL RELEASE (Right) RIGHT TRIGGER FINGER  RELEASE (TENDON SHEATH INCISION) (Right) as a surgical intervention .  The patient's history has been reviewed, patient examined, no change in status, stable for surgery.  I have reviewed the patient's chart and labs.  Questions were answered to the patient's satisfaction.     Elsie Saas A

## 2013-11-28 NOTE — Discharge Instructions (Signed)

## 2013-11-28 NOTE — Anesthesia Preprocedure Evaluation (Addendum)
Anesthesia Evaluation  Patient identified by MRN, date of birth, ID band Patient awake    Reviewed: Allergy & Precautions, H&P , NPO status , Patient's Chart, lab work & pertinent test results  Airway Mallampati: II TM Distance: >3 FB Neck ROM: Full    Dental   Pulmonary  snores breath sounds clear to auscultation        Cardiovascular hypertension, Rhythm:Regular Rate:Normal     Neuro/Psych    GI/Hepatic   Endo/Other    Renal/GU      Musculoskeletal   Abdominal (+) + obese,   Peds  Hematology   Anesthesia Other Findings Flu last week Afebrile today w/minor residual cough  Reproductive/Obstetrics                          Anesthesia Physical Anesthesia Plan  ASA: II  Anesthesia Plan: General   Post-op Pain Management:    Induction: Intravenous  Airway Management Planned: LMA  Additional Equipment:   Intra-op Plan:   Post-operative Plan: Extubation in OR  Informed Consent: I have reviewed the patients History and Physical, chart, labs and discussed the procedure including the risks, benefits and alternatives for the proposed anesthesia with the patient or authorized representative who has indicated his/her understanding and acceptance.     Plan Discussed with: CRNA and Surgeon  Anesthesia Plan Comments:         Anesthesia Quick Evaluation

## 2013-11-28 NOTE — Anesthesia Procedure Notes (Signed)
Procedure Name: LMA Insertion Date/Time: 11/28/2013 12:15 PM Performed by: Lyndee Leo Pre-anesthesia Checklist: Patient identified, Emergency Drugs available, Suction available and Patient being monitored Patient Re-evaluated:Patient Re-evaluated prior to inductionOxygen Delivery Method: Circle System Utilized Preoxygenation: Pre-oxygenation with 100% oxygen Intubation Type: IV induction Ventilation: Mask ventilation without difficulty LMA: LMA inserted LMA Size: 4.0 Number of attempts: 1 Airway Equipment and Method: bite block Placement Confirmation: positive ETCO2 Tube secured with: Tape Dental Injury: Teeth and Oropharynx as per pre-operative assessment

## 2013-11-28 NOTE — H&P (Signed)
Scott [name] is an 68 y.o. male.   Chief Complaint: right hand pain, numbness, tingling with fourth finger catching HPI: Scott [name] is a 68 year old seen for followup evaluation from his right carpal tunnel syndrome.  Scott [name] underwent EMG, PNCV's  on 09/19/2013 that revealed moderate to severe right carpal tunnel syndrome.  Scott [name] continues to have pain and numbness.  Scott [name] is also having triggering of his right fourth finger.  Scott [name] has previously undergone trigger finger releases in his right second and third fingers a number of years ago.  Past Medical History  Diagnosis Date  . Hypertension   . BPH (benign prostatic hypertrophy)   . Nocturia   . Gout   . Arthritis   . RLS (restless legs syndrome)   . Snores   . Hyperlipemia   . Wears glasses   . Carpal tunnel syndrome of right wrist   . Trigger ring finger of right hand     Past Surgical History  Procedure Laterality Date  . Tonsillectomy    . Eye surgery  1978    both cataracts  . Hernia repair  1998    rt ing  . Transurethral resection of prostate  2005  . Shoulder arthroscopy w/ rotator cuff repair  2006    right  . Lipoma excision  2008    rt head  . Colonoscopy    . Trigger finger release      several lt and rt hands  . Penile prosthesis implant  2009  . Penectomy  2007    for pernones    Family History  Problem Relation Age of Onset  .      Social History:  reports that Scott [name] has never smoked. Scott [name] does not have any smokeless tobacco history on file. Scott [name] reports that Scott [name] drinks alcohol. Scott [name] reports that Scott [name] does not use illicit drugs.  Allergies:  Allergies  Allergen Reactions  . Doxycycline Hives  . Penicillins Hives  . Sulfa Antibiotics Hives   Current Facility-Administered Medications on File Prior to Visit  Medication Dose Route Frequency Provider Last Rate Last Dose  . fentaNYL (SUBLIMAZE) injection 50-100 mcg  50-100 mcg Intravenous PRN Napoleon Form, MD      . lactated ringers infusion   Intravenous Continuous  Napoleon Form, MD      . midazolam (VERSED) injection 1-2 mg  1-2 mg Intravenous PRN Napoleon Form, MD       Current Outpatient Prescriptions on File Prior to Visit  Medication Sig Dispense Refill  . allopurinol (ZYLOPRIM) 300 MG tablet Take 300 mg by mouth daily.      Marland Kitchen aspirin 81 MG tablet Take 81 mg by mouth daily.      . enalapril (VASOTEC) 20 MG tablet Take 20 mg by mouth 2 (two) times daily.      . finasteride (PROSCAR) 5 MG tablet Take 5 mg by mouth daily.      Marland Kitchen gemfibrozil (LOPID) 600 MG tablet Take 600 mg by mouth 2 (two) times daily before a meal.      . rOPINIRole (REQUIP) 2 MG tablet Take 2 mg by mouth at bedtime. Takes 2      . solifenacin (VESICARE) 10 MG tablet Take by mouth at bedtime.         (Not in a hospital admission)  No results found for this or any previous visit (from the past 48 hour(s)). No results found.  Review of Systems  Constitutional: Negative.   HENT: Negative.  Eyes: Negative.   Cardiovascular: Negative.   Gastrointestinal: Negative.   Genitourinary: Negative.   Skin: Negative.   Neurological: Negative.   Endo/Heme/Allergies: Negative.   Psychiatric/Behavioral: Negative.     Blood pressure 136/86, height 5' 9" (1.753 m), weight 95.255 kg (210 lb). Physical Exam  Constitutional: Scott [name] is oriented to person, place, and time. Scott [name] appears well-developed and well-nourished.  HENT:  Head: Normocephalic and atraumatic.  Mouth/Throat: Oropharynx is clear and moist.  Eyes: Conjunctivae are normal. Pupils are equal, round, and reactive to light.  Neck: Neck supple.  Cardiovascular: Normal rate.   Murmur heard. Respiratory: Effort normal.  GI: Soft.  Genitourinary:  Not pertinent to current symptomatology therefore not examined.  Musculoskeletal:  Examination of his right hand reveals positive Tinel's, decreased sensation in the median nerve distribution.  Examination of his right fourth finger reveals triggering with full range of motion.  Scott [name] has  no triggering in his other fingers.    Neurological: Scott [name] is alert and oriented to person, place, and time.  Skin: Skin is warm and dry.  Psychiatric: Scott [name] has a normal mood and affect.     Assessment Patient Active Problem List   Diagnosis Date Noted  . Trigger ring finger of right hand   . Wears glasses   . Gout   . Hypertension    Plan I have talked to him about this in detail.  Would recommend with these findings and his persistent pain and disability that we proceed with right carpal tunnel release as well as right fourth trigger finger release.  Risks, complications and benefits of his surgery have been described to him in detail and Scott [name] understands this completely.   Braylon Grenda J 11/28/2013, 10:35 AM    

## 2013-11-29 ENCOUNTER — Encounter (HOSPITAL_BASED_OUTPATIENT_CLINIC_OR_DEPARTMENT_OTHER): Payer: Self-pay | Admitting: Orthopedic Surgery

## 2013-11-29 NOTE — Op Note (Signed)
Scott [name]              ACCOUNT NO.:  [id number]  MEDICAL RECORD NO.:  [id number]  LOCATION:                               FACILITY:  [facility]  PHYSICIAN:  Ardit Danh A. Noemi Chapel, M.D. DATE OF BIRTH:  11/28/2013  DATE OF PROCEDURE:  11/28/2013 DATE OF DISCHARGE:  11/28/2013                              OPERATIVE REPORT   PREOPERATIVE DIAGNOSES: 1. Right carpal tunnel syndrome. 2. Right fourth trigger finger.  POSTOPERATIVE DIAGNOSES: 1. Right carpal tunnel syndrome. 2. Right fourth trigger finger.  PROCEDURE: 1. Right carpal tunnel release. 2. Right fourth trigger finger release.  SURGEON:  Audree Camel. Noemi Chapel, M.D.  ASSISTANT:  Matthew Saras, PA-C  ANESTHESIA:  General.  OPERATIVE TIME:  30 minutes.  COMPLICATIONS:  None.  INDICATION FOR PROCEDURE:  Scott [name] is a 68 year old gentleman who has had a painful right carpal tunnel syndrome and right fourth trigger finger that has failed multiple conservative modalities and is now to undergo right carpal tunnel release and right fourth trigger finger release.  DESCRIPTION:  Scott [name] was brought to the operating room on November 28, 2013, and placed on the operative table in a supine position.  After being placed under general anesthesia, his right hand and arm was prepped using sterile DuraPrep and draped using sterile technique.  Time- out procedure was called and the correct right hand and right fourth finger was identified.  He received antibiotics preoperatively for prophylaxis.  The right hand and arm were exsanguinated and the tourniquet elevated 250 mmHg.  Initially the carpal tunnel release was carried out through a 3-4 cm longitudinal incision based along the carpal canal.  The underlying subcutaneous tissues were incised along with skin incision.  Transverse carpal ligament was exposed to the level of the wrist flexion crease and carefully incised and then a hemostat was used to protect the median nerve,  although the entire transverse carpal ligament was released distally to the level of the superficial palmar arch carefully protecting this and released proximally approximately 3 inches proximal to the wrist flexion crease carefully protecting the palmar cutaneous branch of the median nerve.  A complete release was carried out.  There was found to be significant constriction of the median nerve, but no other pathology was noted.  At this point, the right fourth trigger finger was addressed.  A 1.5 cm longitudinal incision was made over the A1 pulley.  The underlying subcutaneous tissues were incised along with skin incision.  The palmar cutaneous nerves were carefully protected while the flexor tendon sheath was exposed and incised longitudinally.  A complete release was carried out. The underlying flexor tendon was found to be intact.  After this was done, no further triggering was noted and a complete excursion and release was achieved.  At this point, it was felt that all pathology had been satisfactorily addressed.  Wounds were irrigated and then injected with 0.25% Marcaine.  The wounds were closed with interrupted 4-0 nylon suture.  Sterile dressings were applied and a bulky hand dressing. Tourniquet was released and the patient awakened and taken to the recovery room in a stable condition.  Needle and sponge counts were correct x2 at the end of  the case.  FOLLOWUP CARE:  Scott [name] will be followed as an outpatient on Norco for pain.  Seen back in the office in a week for sutures out and followup.     Keldan Eplin A. Noemi Chapel, M.D.     RAW/MEDQ  D:  11/28/2013  T:  11/29/2013  Job:  [id number]

## 2013-12-20 DIAGNOSIS — L570 Actinic keratosis: Secondary | ICD-10-CM | POA: Diagnosis not present

## 2013-12-20 DIAGNOSIS — D485 Neoplasm of uncertain behavior of skin: Secondary | ICD-10-CM | POA: Diagnosis not present

## 2013-12-20 DIAGNOSIS — L219 Seborrheic dermatitis, unspecified: Secondary | ICD-10-CM | POA: Diagnosis not present

## 2014-01-08 DIAGNOSIS — E119 Type 2 diabetes mellitus without complications: Secondary | ICD-10-CM | POA: Diagnosis not present

## 2014-01-08 DIAGNOSIS — E7800 Pure hypercholesterolemia, unspecified: Secondary | ICD-10-CM | POA: Diagnosis not present

## 2014-01-08 DIAGNOSIS — K5732 Diverticulitis of large intestine without perforation or abscess without bleeding: Secondary | ICD-10-CM | POA: Diagnosis not present

## 2014-01-08 DIAGNOSIS — I10 Essential (primary) hypertension: Secondary | ICD-10-CM | POA: Diagnosis not present

## 2014-01-15 DIAGNOSIS — M545 Low back pain, unspecified: Secondary | ICD-10-CM | POA: Diagnosis not present

## 2014-01-15 DIAGNOSIS — E781 Pure hyperglyceridemia: Secondary | ICD-10-CM | POA: Diagnosis not present

## 2014-01-15 DIAGNOSIS — I10 Essential (primary) hypertension: Secondary | ICD-10-CM | POA: Diagnosis not present

## 2014-01-15 DIAGNOSIS — N400 Enlarged prostate without lower urinary tract symptoms: Secondary | ICD-10-CM | POA: Diagnosis not present

## 2014-01-15 DIAGNOSIS — E7800 Pure hypercholesterolemia, unspecified: Secondary | ICD-10-CM | POA: Diagnosis not present

## 2014-01-15 DIAGNOSIS — M109 Gout, unspecified: Secondary | ICD-10-CM | POA: Diagnosis not present

## 2014-01-15 DIAGNOSIS — E119 Type 2 diabetes mellitus without complications: Secondary | ICD-10-CM | POA: Diagnosis not present

## 2014-01-15 DIAGNOSIS — G2581 Restless legs syndrome: Secondary | ICD-10-CM | POA: Diagnosis not present

## 2014-02-28 DIAGNOSIS — M47817 Spondylosis without myelopathy or radiculopathy, lumbosacral region: Secondary | ICD-10-CM | POA: Diagnosis not present

## 2014-02-28 DIAGNOSIS — M5430 Sciatica, unspecified side: Secondary | ICD-10-CM | POA: Diagnosis not present

## 2014-02-28 DIAGNOSIS — M999 Biomechanical lesion, unspecified: Secondary | ICD-10-CM | POA: Diagnosis not present

## 2014-02-28 DIAGNOSIS — E119 Type 2 diabetes mellitus without complications: Secondary | ICD-10-CM | POA: Diagnosis not present

## 2014-03-02 DIAGNOSIS — M999 Biomechanical lesion, unspecified: Secondary | ICD-10-CM | POA: Diagnosis not present

## 2014-03-02 DIAGNOSIS — M5430 Sciatica, unspecified side: Secondary | ICD-10-CM | POA: Diagnosis not present

## 2014-03-02 DIAGNOSIS — M47817 Spondylosis without myelopathy or radiculopathy, lumbosacral region: Secondary | ICD-10-CM | POA: Diagnosis not present

## 2014-03-07 DIAGNOSIS — M5430 Sciatica, unspecified side: Secondary | ICD-10-CM | POA: Diagnosis not present

## 2014-03-07 DIAGNOSIS — M999 Biomechanical lesion, unspecified: Secondary | ICD-10-CM | POA: Diagnosis not present

## 2014-03-07 DIAGNOSIS — M47817 Spondylosis without myelopathy or radiculopathy, lumbosacral region: Secondary | ICD-10-CM | POA: Diagnosis not present

## 2014-03-09 DIAGNOSIS — M47817 Spondylosis without myelopathy or radiculopathy, lumbosacral region: Secondary | ICD-10-CM | POA: Diagnosis not present

## 2014-03-09 DIAGNOSIS — M999 Biomechanical lesion, unspecified: Secondary | ICD-10-CM | POA: Diagnosis not present

## 2014-03-09 DIAGNOSIS — M5430 Sciatica, unspecified side: Secondary | ICD-10-CM | POA: Diagnosis not present

## 2014-03-12 DIAGNOSIS — M5430 Sciatica, unspecified side: Secondary | ICD-10-CM | POA: Diagnosis not present

## 2014-03-12 DIAGNOSIS — M47817 Spondylosis without myelopathy or radiculopathy, lumbosacral region: Secondary | ICD-10-CM | POA: Diagnosis not present

## 2014-03-12 DIAGNOSIS — M999 Biomechanical lesion, unspecified: Secondary | ICD-10-CM | POA: Diagnosis not present

## 2014-03-15 DIAGNOSIS — M47817 Spondylosis without myelopathy or radiculopathy, lumbosacral region: Secondary | ICD-10-CM | POA: Diagnosis not present

## 2014-03-15 DIAGNOSIS — M5430 Sciatica, unspecified side: Secondary | ICD-10-CM | POA: Diagnosis not present

## 2014-03-15 DIAGNOSIS — M999 Biomechanical lesion, unspecified: Secondary | ICD-10-CM | POA: Diagnosis not present

## 2014-03-21 DIAGNOSIS — M999 Biomechanical lesion, unspecified: Secondary | ICD-10-CM | POA: Diagnosis not present

## 2014-03-21 DIAGNOSIS — M47817 Spondylosis without myelopathy or radiculopathy, lumbosacral region: Secondary | ICD-10-CM | POA: Diagnosis not present

## 2014-03-21 DIAGNOSIS — M5430 Sciatica, unspecified side: Secondary | ICD-10-CM | POA: Diagnosis not present

## 2014-03-28 DIAGNOSIS — M5430 Sciatica, unspecified side: Secondary | ICD-10-CM | POA: Diagnosis not present

## 2014-03-28 DIAGNOSIS — M999 Biomechanical lesion, unspecified: Secondary | ICD-10-CM | POA: Diagnosis not present

## 2014-03-28 DIAGNOSIS — M47817 Spondylosis without myelopathy or radiculopathy, lumbosacral region: Secondary | ICD-10-CM | POA: Diagnosis not present

## 2014-04-04 DIAGNOSIS — M47817 Spondylosis without myelopathy or radiculopathy, lumbosacral region: Secondary | ICD-10-CM | POA: Diagnosis not present

## 2014-04-04 DIAGNOSIS — M999 Biomechanical lesion, unspecified: Secondary | ICD-10-CM | POA: Diagnosis not present

## 2014-04-04 DIAGNOSIS — M5430 Sciatica, unspecified side: Secondary | ICD-10-CM | POA: Diagnosis not present

## 2014-04-11 DIAGNOSIS — M47817 Spondylosis without myelopathy or radiculopathy, lumbosacral region: Secondary | ICD-10-CM | POA: Diagnosis not present

## 2014-04-11 DIAGNOSIS — M999 Biomechanical lesion, unspecified: Secondary | ICD-10-CM | POA: Diagnosis not present

## 2014-04-11 DIAGNOSIS — M5430 Sciatica, unspecified side: Secondary | ICD-10-CM | POA: Diagnosis not present

## 2014-04-25 DIAGNOSIS — M5430 Sciatica, unspecified side: Secondary | ICD-10-CM | POA: Diagnosis not present

## 2014-04-25 DIAGNOSIS — M999 Biomechanical lesion, unspecified: Secondary | ICD-10-CM | POA: Diagnosis not present

## 2014-04-25 DIAGNOSIS — M47817 Spondylosis without myelopathy or radiculopathy, lumbosacral region: Secondary | ICD-10-CM | POA: Diagnosis not present

## 2014-05-09 DIAGNOSIS — M999 Biomechanical lesion, unspecified: Secondary | ICD-10-CM | POA: Diagnosis not present

## 2014-05-09 DIAGNOSIS — M5430 Sciatica, unspecified side: Secondary | ICD-10-CM | POA: Diagnosis not present

## 2014-05-09 DIAGNOSIS — M47817 Spondylosis without myelopathy or radiculopathy, lumbosacral region: Secondary | ICD-10-CM | POA: Diagnosis not present

## 2014-05-28 DIAGNOSIS — M47817 Spondylosis without myelopathy or radiculopathy, lumbosacral region: Secondary | ICD-10-CM | POA: Diagnosis not present

## 2014-05-28 DIAGNOSIS — M5430 Sciatica, unspecified side: Secondary | ICD-10-CM | POA: Diagnosis not present

## 2014-05-28 DIAGNOSIS — M999 Biomechanical lesion, unspecified: Secondary | ICD-10-CM | POA: Diagnosis not present

## 2014-06-11 DIAGNOSIS — M47817 Spondylosis without myelopathy or radiculopathy, lumbosacral region: Secondary | ICD-10-CM | POA: Diagnosis not present

## 2014-06-11 DIAGNOSIS — M5430 Sciatica, unspecified side: Secondary | ICD-10-CM | POA: Diagnosis not present

## 2014-06-11 DIAGNOSIS — M999 Biomechanical lesion, unspecified: Secondary | ICD-10-CM | POA: Diagnosis not present

## 2014-06-25 DIAGNOSIS — M47817 Spondylosis without myelopathy or radiculopathy, lumbosacral region: Secondary | ICD-10-CM | POA: Diagnosis not present

## 2014-06-25 DIAGNOSIS — M5430 Sciatica, unspecified side: Secondary | ICD-10-CM | POA: Diagnosis not present

## 2014-06-25 DIAGNOSIS — M999 Biomechanical lesion, unspecified: Secondary | ICD-10-CM | POA: Diagnosis not present

## 2014-07-03 DIAGNOSIS — E119 Type 2 diabetes mellitus without complications: Secondary | ICD-10-CM | POA: Diagnosis not present

## 2014-07-03 DIAGNOSIS — E7800 Pure hypercholesterolemia, unspecified: Secondary | ICD-10-CM | POA: Diagnosis not present

## 2014-07-03 DIAGNOSIS — I10 Essential (primary) hypertension: Secondary | ICD-10-CM | POA: Diagnosis not present

## 2014-07-10 DIAGNOSIS — G2581 Restless legs syndrome: Secondary | ICD-10-CM | POA: Diagnosis not present

## 2014-07-10 DIAGNOSIS — Z23 Encounter for immunization: Secondary | ICD-10-CM | POA: Diagnosis not present

## 2014-07-10 DIAGNOSIS — E781 Pure hyperglyceridemia: Secondary | ICD-10-CM | POA: Diagnosis not present

## 2014-07-10 DIAGNOSIS — E7800 Pure hypercholesterolemia, unspecified: Secondary | ICD-10-CM | POA: Diagnosis not present

## 2014-07-10 DIAGNOSIS — N400 Enlarged prostate without lower urinary tract symptoms: Secondary | ICD-10-CM | POA: Diagnosis not present

## 2014-07-10 DIAGNOSIS — I10 Essential (primary) hypertension: Secondary | ICD-10-CM | POA: Diagnosis not present

## 2014-07-10 DIAGNOSIS — E119 Type 2 diabetes mellitus without complications: Secondary | ICD-10-CM | POA: Diagnosis not present

## 2014-07-10 DIAGNOSIS — M109 Gout, unspecified: Secondary | ICD-10-CM | POA: Diagnosis not present

## 2014-08-09 DIAGNOSIS — J0100 Acute maxillary sinusitis, unspecified: Secondary | ICD-10-CM | POA: Diagnosis not present

## 2014-09-28 DIAGNOSIS — Z23 Encounter for immunization: Secondary | ICD-10-CM | POA: Diagnosis not present

## 2014-12-19 DIAGNOSIS — L570 Actinic keratosis: Secondary | ICD-10-CM | POA: Diagnosis not present

## 2014-12-19 DIAGNOSIS — D485 Neoplasm of uncertain behavior of skin: Secondary | ICD-10-CM | POA: Diagnosis not present

## 2014-12-19 DIAGNOSIS — L219 Seborrheic dermatitis, unspecified: Secondary | ICD-10-CM | POA: Diagnosis not present

## 2014-12-19 DIAGNOSIS — L905 Scar conditions and fibrosis of skin: Secondary | ICD-10-CM | POA: Diagnosis not present

## 2015-01-02 DIAGNOSIS — N400 Enlarged prostate without lower urinary tract symptoms: Secondary | ICD-10-CM | POA: Diagnosis not present

## 2015-01-02 DIAGNOSIS — I10 Essential (primary) hypertension: Secondary | ICD-10-CM | POA: Diagnosis not present

## 2015-01-02 DIAGNOSIS — E780 Pure hypercholesterolemia: Secondary | ICD-10-CM | POA: Diagnosis not present

## 2015-01-02 DIAGNOSIS — E119 Type 2 diabetes mellitus without complications: Secondary | ICD-10-CM | POA: Diagnosis not present

## 2015-01-09 DIAGNOSIS — I10 Essential (primary) hypertension: Secondary | ICD-10-CM | POA: Diagnosis not present

## 2015-01-09 DIAGNOSIS — E119 Type 2 diabetes mellitus without complications: Secondary | ICD-10-CM | POA: Diagnosis not present

## 2015-01-09 DIAGNOSIS — N401 Enlarged prostate with lower urinary tract symptoms: Secondary | ICD-10-CM | POA: Diagnosis not present

## 2015-01-09 DIAGNOSIS — E782 Mixed hyperlipidemia: Secondary | ICD-10-CM | POA: Diagnosis not present

## 2015-01-09 DIAGNOSIS — Z1389 Encounter for screening for other disorder: Secondary | ICD-10-CM | POA: Diagnosis not present

## 2015-01-09 DIAGNOSIS — E781 Pure hyperglyceridemia: Secondary | ICD-10-CM | POA: Diagnosis not present

## 2015-01-09 DIAGNOSIS — G2581 Restless legs syndrome: Secondary | ICD-10-CM | POA: Diagnosis not present

## 2015-01-29 DIAGNOSIS — M19012 Primary osteoarthritis, left shoulder: Secondary | ICD-10-CM | POA: Diagnosis not present

## 2015-02-18 DIAGNOSIS — K5732 Diverticulitis of large intestine without perforation or abscess without bleeding: Secondary | ICD-10-CM | POA: Diagnosis not present

## 2015-03-19 DIAGNOSIS — E119 Type 2 diabetes mellitus without complications: Secondary | ICD-10-CM | POA: Diagnosis not present

## 2015-03-19 DIAGNOSIS — K5732 Diverticulitis of large intestine without perforation or abscess without bleeding: Secondary | ICD-10-CM | POA: Diagnosis not present

## 2015-03-19 DIAGNOSIS — N400 Enlarged prostate without lower urinary tract symptoms: Secondary | ICD-10-CM | POA: Diagnosis not present

## 2015-04-17 DIAGNOSIS — M9903 Segmental and somatic dysfunction of lumbar region: Secondary | ICD-10-CM | POA: Diagnosis not present

## 2015-04-17 DIAGNOSIS — M5441 Lumbago with sciatica, right side: Secondary | ICD-10-CM | POA: Diagnosis not present

## 2015-04-17 DIAGNOSIS — M47816 Spondylosis without myelopathy or radiculopathy, lumbar region: Secondary | ICD-10-CM | POA: Diagnosis not present

## 2015-04-24 DIAGNOSIS — M9903 Segmental and somatic dysfunction of lumbar region: Secondary | ICD-10-CM | POA: Diagnosis not present

## 2015-04-24 DIAGNOSIS — M5441 Lumbago with sciatica, right side: Secondary | ICD-10-CM | POA: Diagnosis not present

## 2015-04-24 DIAGNOSIS — M47816 Spondylosis without myelopathy or radiculopathy, lumbar region: Secondary | ICD-10-CM | POA: Diagnosis not present

## 2015-04-26 DIAGNOSIS — M5441 Lumbago with sciatica, right side: Secondary | ICD-10-CM | POA: Diagnosis not present

## 2015-04-26 DIAGNOSIS — M9902 Segmental and somatic dysfunction of thoracic region: Secondary | ICD-10-CM | POA: Diagnosis not present

## 2015-04-26 DIAGNOSIS — M47816 Spondylosis without myelopathy or radiculopathy, lumbar region: Secondary | ICD-10-CM | POA: Diagnosis not present

## 2015-04-26 DIAGNOSIS — M546 Pain in thoracic spine: Secondary | ICD-10-CM | POA: Diagnosis not present

## 2015-04-26 DIAGNOSIS — M9901 Segmental and somatic dysfunction of cervical region: Secondary | ICD-10-CM | POA: Diagnosis not present

## 2015-04-26 DIAGNOSIS — M531 Cervicobrachial syndrome: Secondary | ICD-10-CM | POA: Diagnosis not present

## 2015-04-26 DIAGNOSIS — M9903 Segmental and somatic dysfunction of lumbar region: Secondary | ICD-10-CM | POA: Diagnosis not present

## 2015-04-29 DIAGNOSIS — M9903 Segmental and somatic dysfunction of lumbar region: Secondary | ICD-10-CM | POA: Diagnosis not present

## 2015-04-29 DIAGNOSIS — M9902 Segmental and somatic dysfunction of thoracic region: Secondary | ICD-10-CM | POA: Diagnosis not present

## 2015-04-29 DIAGNOSIS — M47816 Spondylosis without myelopathy or radiculopathy, lumbar region: Secondary | ICD-10-CM | POA: Diagnosis not present

## 2015-04-29 DIAGNOSIS — M5441 Lumbago with sciatica, right side: Secondary | ICD-10-CM | POA: Diagnosis not present

## 2015-04-29 DIAGNOSIS — M546 Pain in thoracic spine: Secondary | ICD-10-CM | POA: Diagnosis not present

## 2015-04-29 DIAGNOSIS — M9901 Segmental and somatic dysfunction of cervical region: Secondary | ICD-10-CM | POA: Diagnosis not present

## 2015-04-29 DIAGNOSIS — M531 Cervicobrachial syndrome: Secondary | ICD-10-CM | POA: Diagnosis not present

## 2015-05-01 DIAGNOSIS — M9901 Segmental and somatic dysfunction of cervical region: Secondary | ICD-10-CM | POA: Diagnosis not present

## 2015-05-01 DIAGNOSIS — M531 Cervicobrachial syndrome: Secondary | ICD-10-CM | POA: Diagnosis not present

## 2015-05-01 DIAGNOSIS — M9902 Segmental and somatic dysfunction of thoracic region: Secondary | ICD-10-CM | POA: Diagnosis not present

## 2015-05-01 DIAGNOSIS — M5441 Lumbago with sciatica, right side: Secondary | ICD-10-CM | POA: Diagnosis not present

## 2015-05-01 DIAGNOSIS — M47816 Spondylosis without myelopathy or radiculopathy, lumbar region: Secondary | ICD-10-CM | POA: Diagnosis not present

## 2015-05-01 DIAGNOSIS — M9903 Segmental and somatic dysfunction of lumbar region: Secondary | ICD-10-CM | POA: Diagnosis not present

## 2015-05-01 DIAGNOSIS — M5431 Sciatica, right side: Secondary | ICD-10-CM | POA: Diagnosis not present

## 2015-05-01 DIAGNOSIS — M546 Pain in thoracic spine: Secondary | ICD-10-CM | POA: Diagnosis not present

## 2015-05-06 DIAGNOSIS — M9902 Segmental and somatic dysfunction of thoracic region: Secondary | ICD-10-CM | POA: Diagnosis not present

## 2015-05-06 DIAGNOSIS — M546 Pain in thoracic spine: Secondary | ICD-10-CM | POA: Diagnosis not present

## 2015-05-06 DIAGNOSIS — M5441 Lumbago with sciatica, right side: Secondary | ICD-10-CM | POA: Diagnosis not present

## 2015-05-06 DIAGNOSIS — M5431 Sciatica, right side: Secondary | ICD-10-CM | POA: Diagnosis not present

## 2015-05-06 DIAGNOSIS — M9903 Segmental and somatic dysfunction of lumbar region: Secondary | ICD-10-CM | POA: Diagnosis not present

## 2015-05-06 DIAGNOSIS — M47816 Spondylosis without myelopathy or radiculopathy, lumbar region: Secondary | ICD-10-CM | POA: Diagnosis not present

## 2015-05-06 DIAGNOSIS — M9901 Segmental and somatic dysfunction of cervical region: Secondary | ICD-10-CM | POA: Diagnosis not present

## 2015-05-06 DIAGNOSIS — M531 Cervicobrachial syndrome: Secondary | ICD-10-CM | POA: Diagnosis not present

## 2015-05-09 DIAGNOSIS — M9902 Segmental and somatic dysfunction of thoracic region: Secondary | ICD-10-CM | POA: Diagnosis not present

## 2015-05-09 DIAGNOSIS — M531 Cervicobrachial syndrome: Secondary | ICD-10-CM | POA: Diagnosis not present

## 2015-05-09 DIAGNOSIS — M9901 Segmental and somatic dysfunction of cervical region: Secondary | ICD-10-CM | POA: Diagnosis not present

## 2015-05-09 DIAGNOSIS — M5431 Sciatica, right side: Secondary | ICD-10-CM | POA: Diagnosis not present

## 2015-05-09 DIAGNOSIS — M9903 Segmental and somatic dysfunction of lumbar region: Secondary | ICD-10-CM | POA: Diagnosis not present

## 2015-05-09 DIAGNOSIS — M5441 Lumbago with sciatica, right side: Secondary | ICD-10-CM | POA: Diagnosis not present

## 2015-05-09 DIAGNOSIS — M545 Low back pain: Secondary | ICD-10-CM | POA: Diagnosis not present

## 2015-05-09 DIAGNOSIS — M546 Pain in thoracic spine: Secondary | ICD-10-CM | POA: Diagnosis not present

## 2015-05-09 DIAGNOSIS — M47816 Spondylosis without myelopathy or radiculopathy, lumbar region: Secondary | ICD-10-CM | POA: Diagnosis not present

## 2015-05-21 DIAGNOSIS — E11319 Type 2 diabetes mellitus with unspecified diabetic retinopathy without macular edema: Secondary | ICD-10-CM | POA: Diagnosis not present

## 2015-05-21 DIAGNOSIS — K1121 Acute sialoadenitis: Secondary | ICD-10-CM | POA: Diagnosis not present

## 2015-06-17 DIAGNOSIS — R300 Dysuria: Secondary | ICD-10-CM | POA: Diagnosis not present

## 2015-06-25 DIAGNOSIS — R300 Dysuria: Secondary | ICD-10-CM | POA: Diagnosis not present

## 2015-06-27 DIAGNOSIS — I10 Essential (primary) hypertension: Secondary | ICD-10-CM | POA: Diagnosis not present

## 2015-06-27 DIAGNOSIS — E782 Mixed hyperlipidemia: Secondary | ICD-10-CM | POA: Diagnosis not present

## 2015-06-27 DIAGNOSIS — E119 Type 2 diabetes mellitus without complications: Secondary | ICD-10-CM | POA: Diagnosis not present

## 2015-06-27 DIAGNOSIS — E780 Pure hypercholesterolemia: Secondary | ICD-10-CM | POA: Diagnosis not present

## 2015-06-27 DIAGNOSIS — E781 Pure hyperglyceridemia: Secondary | ICD-10-CM | POA: Diagnosis not present

## 2015-07-04 DIAGNOSIS — E782 Mixed hyperlipidemia: Secondary | ICD-10-CM | POA: Diagnosis not present

## 2015-07-04 DIAGNOSIS — I10 Essential (primary) hypertension: Secondary | ICD-10-CM | POA: Diagnosis not present

## 2015-07-04 DIAGNOSIS — E119 Type 2 diabetes mellitus without complications: Secondary | ICD-10-CM | POA: Diagnosis not present

## 2015-07-04 DIAGNOSIS — N401 Enlarged prostate with lower urinary tract symptoms: Secondary | ICD-10-CM | POA: Diagnosis not present

## 2015-07-04 DIAGNOSIS — E781 Pure hyperglyceridemia: Secondary | ICD-10-CM | POA: Diagnosis not present

## 2015-07-04 DIAGNOSIS — G2581 Restless legs syndrome: Secondary | ICD-10-CM | POA: Diagnosis not present

## 2015-09-23 DIAGNOSIS — Z23 Encounter for immunization: Secondary | ICD-10-CM | POA: Diagnosis not present

## 2015-11-04 DIAGNOSIS — M1000 Idiopathic gout, unspecified site: Secondary | ICD-10-CM | POA: Diagnosis not present

## 2015-11-04 DIAGNOSIS — E119 Type 2 diabetes mellitus without complications: Secondary | ICD-10-CM | POA: Diagnosis not present

## 2015-11-04 DIAGNOSIS — N401 Enlarged prostate with lower urinary tract symptoms: Secondary | ICD-10-CM | POA: Diagnosis not present

## 2015-11-04 DIAGNOSIS — E7800 Pure hypercholesterolemia, unspecified: Secondary | ICD-10-CM | POA: Diagnosis not present

## 2015-11-04 DIAGNOSIS — I10 Essential (primary) hypertension: Secondary | ICD-10-CM | POA: Diagnosis not present

## 2015-11-11 DIAGNOSIS — I10 Essential (primary) hypertension: Secondary | ICD-10-CM | POA: Diagnosis not present

## 2015-11-11 DIAGNOSIS — E119 Type 2 diabetes mellitus without complications: Secondary | ICD-10-CM | POA: Diagnosis not present

## 2015-11-11 DIAGNOSIS — N401 Enlarged prostate with lower urinary tract symptoms: Secondary | ICD-10-CM | POA: Diagnosis not present

## 2015-11-11 DIAGNOSIS — G2581 Restless legs syndrome: Secondary | ICD-10-CM | POA: Diagnosis not present

## 2015-11-11 DIAGNOSIS — M1A9XX0 Chronic gout, unspecified, without tophus (tophi): Secondary | ICD-10-CM | POA: Diagnosis not present

## 2015-11-11 DIAGNOSIS — E782 Mixed hyperlipidemia: Secondary | ICD-10-CM | POA: Diagnosis not present

## 2015-12-18 DIAGNOSIS — L570 Actinic keratosis: Secondary | ICD-10-CM | POA: Diagnosis not present

## 2015-12-18 DIAGNOSIS — L859 Epidermal thickening, unspecified: Secondary | ICD-10-CM | POA: Diagnosis not present

## 2015-12-18 DIAGNOSIS — L219 Seborrheic dermatitis, unspecified: Secondary | ICD-10-CM | POA: Diagnosis not present

## 2015-12-18 DIAGNOSIS — D485 Neoplasm of uncertain behavior of skin: Secondary | ICD-10-CM | POA: Diagnosis not present

## 2016-02-20 DIAGNOSIS — N400 Enlarged prostate without lower urinary tract symptoms: Secondary | ICD-10-CM | POA: Diagnosis not present

## 2016-02-20 DIAGNOSIS — M1000 Idiopathic gout, unspecified site: Secondary | ICD-10-CM | POA: Diagnosis not present

## 2016-02-20 DIAGNOSIS — I10 Essential (primary) hypertension: Secondary | ICD-10-CM | POA: Diagnosis not present

## 2016-02-20 DIAGNOSIS — E119 Type 2 diabetes mellitus without complications: Secondary | ICD-10-CM | POA: Diagnosis not present

## 2016-02-20 DIAGNOSIS — E782 Mixed hyperlipidemia: Secondary | ICD-10-CM | POA: Diagnosis not present

## 2016-02-27 DIAGNOSIS — M1A9XX0 Chronic gout, unspecified, without tophus (tophi): Secondary | ICD-10-CM | POA: Diagnosis not present

## 2016-02-27 DIAGNOSIS — N401 Enlarged prostate with lower urinary tract symptoms: Secondary | ICD-10-CM | POA: Diagnosis not present

## 2016-02-27 DIAGNOSIS — E782 Mixed hyperlipidemia: Secondary | ICD-10-CM | POA: Diagnosis not present

## 2016-02-27 DIAGNOSIS — Z1389 Encounter for screening for other disorder: Secondary | ICD-10-CM | POA: Diagnosis not present

## 2016-02-27 DIAGNOSIS — E1140 Type 2 diabetes mellitus with diabetic neuropathy, unspecified: Secondary | ICD-10-CM | POA: Diagnosis not present

## 2016-02-27 DIAGNOSIS — I10 Essential (primary) hypertension: Secondary | ICD-10-CM | POA: Diagnosis not present

## 2016-02-27 DIAGNOSIS — G2581 Restless legs syndrome: Secondary | ICD-10-CM | POA: Diagnosis not present

## 2016-03-03 DIAGNOSIS — K5792 Diverticulitis of intestine, part unspecified, without perforation or abscess without bleeding: Secondary | ICD-10-CM | POA: Diagnosis not present

## 2016-05-05 DIAGNOSIS — E11319 Type 2 diabetes mellitus with unspecified diabetic retinopathy without macular edema: Secondary | ICD-10-CM | POA: Diagnosis not present

## 2016-07-10 DIAGNOSIS — E7800 Pure hypercholesterolemia, unspecified: Secondary | ICD-10-CM | POA: Diagnosis not present

## 2016-07-10 DIAGNOSIS — E781 Pure hyperglyceridemia: Secondary | ICD-10-CM | POA: Diagnosis not present

## 2016-07-10 DIAGNOSIS — I10 Essential (primary) hypertension: Secondary | ICD-10-CM | POA: Diagnosis not present

## 2016-07-10 DIAGNOSIS — E782 Mixed hyperlipidemia: Secondary | ICD-10-CM | POA: Diagnosis not present

## 2016-07-10 DIAGNOSIS — E1140 Type 2 diabetes mellitus with diabetic neuropathy, unspecified: Secondary | ICD-10-CM | POA: Diagnosis not present

## 2016-07-15 DIAGNOSIS — E782 Mixed hyperlipidemia: Secondary | ICD-10-CM | POA: Diagnosis not present

## 2016-07-15 DIAGNOSIS — G2581 Restless legs syndrome: Secondary | ICD-10-CM | POA: Diagnosis not present

## 2016-07-15 DIAGNOSIS — I10 Essential (primary) hypertension: Secondary | ICD-10-CM | POA: Diagnosis not present

## 2016-07-15 DIAGNOSIS — M1A9XX0 Chronic gout, unspecified, without tophus (tophi): Secondary | ICD-10-CM | POA: Diagnosis not present

## 2016-07-15 DIAGNOSIS — Z6833 Body mass index (BMI) 33.0-33.9, adult: Secondary | ICD-10-CM | POA: Diagnosis not present

## 2016-07-15 DIAGNOSIS — E1140 Type 2 diabetes mellitus with diabetic neuropathy, unspecified: Secondary | ICD-10-CM | POA: Diagnosis not present

## 2016-07-15 DIAGNOSIS — N401 Enlarged prostate with lower urinary tract symptoms: Secondary | ICD-10-CM | POA: Diagnosis not present

## 2016-09-10 DIAGNOSIS — Z23 Encounter for immunization: Secondary | ICD-10-CM | POA: Diagnosis not present

## 2016-10-16 DIAGNOSIS — K210 Gastro-esophageal reflux disease with esophagitis: Secondary | ICD-10-CM | POA: Diagnosis not present

## 2016-10-16 DIAGNOSIS — Z6833 Body mass index (BMI) 33.0-33.9, adult: Secondary | ICD-10-CM | POA: Diagnosis not present

## 2016-10-16 DIAGNOSIS — I200 Unstable angina: Secondary | ICD-10-CM | POA: Diagnosis not present

## 2016-10-19 ENCOUNTER — Encounter: Payer: Self-pay | Admitting: Cardiology

## 2016-10-19 ENCOUNTER — Ambulatory Visit (HOSPITAL_BASED_OUTPATIENT_CLINIC_OR_DEPARTMENT_OTHER)
Admission: RE | Admit: 2016-10-19 | Discharge: 2016-10-19 | Disposition: A | Discharge status: Home / Self Care | Payer: Medicare Other | Source: Ambulatory Visit | Attending: Cardiology | Admitting: Cardiology

## 2016-10-19 ENCOUNTER — Other Ambulatory Visit: Payer: Self-pay | Admitting: Cardiology

## 2016-10-19 ENCOUNTER — Ambulatory Visit (INDEPENDENT_AMBULATORY_CARE_PROVIDER_SITE_OTHER): Payer: Medicare Other | Admitting: Cardiology

## 2016-10-19 VITALS — BP 118/78 | HR 67 | Ht 69.0 in | Wt 217.0 lb

## 2016-10-19 DIAGNOSIS — I350 Nonrheumatic aortic (valve) stenosis: Secondary | ICD-10-CM

## 2016-10-19 DIAGNOSIS — I200 Unstable angina: Secondary | ICD-10-CM | POA: Diagnosis not present

## 2016-10-19 DIAGNOSIS — D689 Coagulation defect, unspecified: Secondary | ICD-10-CM | POA: Diagnosis not present

## 2016-10-19 DIAGNOSIS — Z01818 Encounter for other preprocedural examination: Secondary | ICD-10-CM

## 2016-10-19 DIAGNOSIS — R5383 Other fatigue: Secondary | ICD-10-CM

## 2016-10-19 DIAGNOSIS — Q231 Congenital insufficiency of aortic valve: Secondary | ICD-10-CM

## 2016-10-19 LAB — ECHOCARDIOGRAM COMPLETE
HEIGHTINCHES: 69 in
WEIGHTICAEL: 3472 [oz_av]

## 2016-10-19 LAB — CBC
HEMATOCRIT: 43.1 % (ref 38.5–50.0)
Hemoglobin: 15.1 g/dL (ref 13.2–17.1)
MCH: 30.1 pg (ref 27.0–33.0)
MCHC: 35 g/dL (ref 32.0–36.0)
MCV: 85.9 fL (ref 80.0–100.0)
MPV: 10.6 fL (ref 7.5–12.5)
Platelets: 153 10*3/uL (ref 140–400)
RBC: 5.02 MIL/uL (ref 4.20–5.80)
RDW: 14.5 % (ref 11.0–15.0)
WBC: 4.7 10*3/uL (ref 3.8–10.8)

## 2016-10-19 LAB — BASIC METABOLIC PANEL
BUN: 14 mg/dL (ref 7–25)
CALCIUM: 9.8 mg/dL (ref 8.6–10.3)
CO2: 25 mmol/L (ref 20–31)
Chloride: 105 mmol/L (ref 98–110)
Creat: 0.87 mg/dL (ref 0.70–1.18)
GLUCOSE: 120 mg/dL — AB (ref 65–99)
Potassium: 4.8 mmol/L (ref 3.5–5.3)
SODIUM: 140 mmol/L (ref 135–146)

## 2016-10-19 MED ORDER — METOPROLOL SUCCINATE ER 25 MG PO TB24: 25.0000 mg | ORAL_TABLET | Freq: Every day | ORAL | 11 refills | Status: DC

## 2016-10-19 NOTE — Patient Instructions (Addendum)
Medication Instructions:  START- Toprol XL 25 mg daily  Labwork: Pre-Op Labs Today  Testing/Procedures: Your physician has requested that you have an echocardiogram. Echocardiography is a painless test that uses sound waves to create images of your heart. It provides your doctor with information about the size and shape of your heart and how well your heart's chambers and valves are working. This procedure takes approximately one hour. There are no restrictions for this procedure.  Your physician has requested that you have a Left Heat cardiac catheterization. Cardiac catheterization is used to diagnose and/or treat various heart conditions. Doctors may recommend this procedure for a number of different reasons. The most common reason is to evaluate chest pain. Chest pain can be a symptom of coronary artery disease (CAD), and cardiac catheterization can show whether plaque is narrowing or blocking your heart's arteries. This procedure is also used to evaluate the valves, as well as measure the blood flow and oxygen levels in different parts of your heart. For further information please visit [email or web address]. Please follow instruction sheet, as given.   Follow-Up: Your physician recommends that you schedule a follow-up appointment in: After Cath   Any Other Special Instructions Will Be Listed Below (If Applicable).        Happy Holidays   If you need a refill on your cardiac medications before your next appointment, please call your pharmacy.

## 2016-10-19 NOTE — Progress Notes (Signed)
Cardiology Office Note   Date:  10/19/2016   ID:  Scott [name], DOB December 17, 1945, MRN [id number]  PCP:  Manon Hilding, MD  Cardiologist:   Minus Breeding, MD  Referring:  Manon Hilding, MD  Chief Complaint  Patient presents with  . Chest Pain     History of Present Illness: Scott [name] is a 70 y.o. male who presents for evaluation of chest pain. He was an add-on this morning. He has no prior cardiac history other than it turns out a murmur and he thinks he might have had an ultrasound years ago. He started noticing chest pain about 2 months ago. It happened first when he would eat or bend over. However, it started happening with exertion. It has progressed to happen with less and less exertion. It is a slightly more intense discomfort. It is 4 out of 10 and midsternal. It feels like an intense indigestion. He gets somewhat short of breath with it. There is no radiation to his discharge to his arms. He does not have nausea vomiting or diaphoresis. Doesn't have palpitations, presyncope or syncope. He's had no weight gain or edema.  Past Medical History:  Diagnosis Date  . Arthritis   . BPH (benign prostatic hypertrophy)   . Carpal tunnel syndrome of right wrist   . Diabetes ([place])   . Diverticulitis   . Gout   . Hyperlipemia   . Hypertension   . RLS (restless legs syndrome)   . Snores    Never been tested for sleep apnea  . Trigger ring finger of right hand     Past Surgical History:  Procedure Laterality Date  . CARPAL TUNNEL RELEASE Right 11/28/2013   Procedure: RIGHT WRIST CARPAL TUNNEL RELEASE;  Surgeon: Lorn Junes, MD;  Location: [facility];  Service: Orthopedics;  Laterality: Right;  . COLONOSCOPY    . EYE SURGERY  1978   both cataracts  . HERNIA REPAIR  1998   rt ing  . LIPOMA EXCISION  2008   rt head  . PENECTOMY  2007   for pernones  . PENILE PROSTHESIS IMPLANT  2009  . SHOULDER ARTHROSCOPY W/ ROTATOR CUFF REPAIR  2006   right  .  TONSILLECTOMY    . TRANSURETHRAL RESECTION OF PROSTATE  2005  . TRIGGER FINGER RELEASE     several lt and rt hands  . TRIGGER FINGER RELEASE Right 11/28/2013   Procedure: RIGHT TRIGGER FINGER  RELEASE (TENDON SHEATH INCISION);  Surgeon: Lorn Junes, MD;  Location: [facility];  Service: Orthopedics;  Laterality: Right;     Current Outpatient Prescriptions  Medication Sig Dispense Refill  . allopurinol (ZYLOPRIM) 300 MG tablet Take 300 mg by mouth daily.    Marland Kitchen aspirin 81 MG tablet Take 81 mg by mouth 2 (two) times daily.     . Calcium-Magnesium-Zinc (CAL-MAG-ZINC PO) Take 1-2 tablets by mouth 2 (two) times daily. 1 tablet in the AM and 2 tablets in the PM.    . Cholecalciferol (VITAMIN D3) 1000 units CAPS Take 1,000 Units by mouth at bedtime.     . enalapril (VASOTEC) 20 MG tablet Take 20 mg by mouth 2 (two) times daily.    . finasteride (PROSCAR) 5 MG tablet Take 5 mg by mouth daily.     Marland Kitchen gemfibrozil (LOPID) 600 MG tablet Take 600 mg by mouth 2 (two) times daily before a meal.    . Melatonin 10 MG CAPS Take 10 mg  by mouth at bedtime.     . metFORMIN (GLUCOPHAGE-XR) 500 MG 24 hr tablet Take 500 mg by mouth at bedtime.     . nitroGLYCERIN (NITROSTAT) 0.4 MG SL tablet Place 0.4 mg under the tongue every 5 (five) minutes as needed.     . Omega-3 Fatty Acids (FISH OIL) 1000 MG CPDR Take 2,000 mg by mouth 2 (two) times daily.     Marland Kitchen rOPINIRole (REQUIP) 2 MG tablet Take 2 mg by mouth See admin instructions. Takes 2mg  at 4:30pm and 2mg  at 9:30pm.    . Turmeric 500 MG CAPS Take 500 mg by mouth 2 (two) times daily.     . vitamin E 400 UNIT capsule Take 400 Units by mouth daily.    . metoprolol succinate (TOPROL XL) 25 MG 24 hr tablet Take 1 tablet (25 mg total) by mouth daily. 30 tablet 11   No current facility-administered medications for this visit.     Allergies:   Doxycycline; Penicillins; and Sulfa antibiotics    Social History:  The patient  reports that he has never  smoked. He does not have any smokeless tobacco history on file. He reports that he drinks alcohol. He reports that he does not use drugs.   Family History:  The patient's family history includes Cancer in his sister; Clotting disorder in his father; Heart disease (age of onset: 16) in his sister; Lung cancer in his mother.    ROS:  Please see the history of present illness.   Otherwise, review of systems are positive for none.   All other systems are reviewed and negative.    PHYSICAL EXAM: VS:  BP 118/78   Pulse 67   Ht 5\' 9"  (1.753 m)   Wt 217 lb (98.4 kg)   BMI 32.05 kg/m  , BMI Body mass index is 32.05 kg/m. GENERAL:  Well appearing HEENT:  Pupils equal round and reactive, fundi not visualized, oral mucosa unremarkable NECK:  No jugular venous distention, waveform within normal limits, carotid upstroke brisk and symmetric, no bruits, no thyromegaly LYMPHATICS:  No cervical, inguinal adenopathy LUNGS:  Clear to auscultation bilaterally BACK:  No CVA tenderness CHEST:  Unremarkable HEART:  PMI not displaced or sustained,S1 and S2 within normal limits, no S3, no S4, no clicks, no rubs, 3 out of 6 apical systolic murmur mid peaking and radiating out the aortic outflow tract, no diastolic murmurs ABD:  Flat, positive bowel sounds normal in frequency in pitch, no bruits, no rebound, no guarding, no midline pulsatile mass, no hepatomegaly, no splenomegaly EXT:  2 plus pulses throughout, no edema, no cyanosis no clubbing SKIN:  No rashes no nodules NEURO:  Cranial nerves II through XII grossly intact, motor grossly intact throughout PSYCH:  Cognitively intact, oriented to person place and time    EKG:  EKG is not ordered today. The ekg ordered 11/23/17demonstrates sinus rhythm, 76, axis within normal limits, intervals within normal limits, nonspecific inferolateral T-wave flattening, poor anterior R-wave progression   Recent Labs: No results found for requested labs within last 8760  hours.    Lipid Panel No results found for: CHOL, TRIG, HDL, CHOLHDL, VLDL, LDLCALC, LDLDIRECT    Wt Readings from Last 3 Encounters:  10/19/16 217 lb (98.4 kg)  11/28/13 220 lb (99.8 kg)  11/28/13 210 lb (95.3 kg)      Other studies Reviewed: Additional studies/ records that were reviewed today include: EKG. Review of the above records demonstrates:  Please see elsewhere in the note.  ASSESSMENT AND PLAN:  CHEST PAIN:  Consistent with new onset exertional angina. Given this cardiac cath is indicated.  The patient understands that risks included but are not limited to stroke (1 in 1000), death (1 in 3), kidney failure [usually temporary] (1 in 500), bleeding (1 in 200), allergic reaction [possibly serious] (1 in 200).  The patient understands and agrees to proceed.   The patient will be taking ASA.  I will start a low dose of beta blocker.  He will be given SLNTG.   We will plan a cardiac cath later this week.  MURMUR:  Before completing the catheterization he will need an echocardiogram as he has aortic stenosis I suspect is at least moderate and could be the culprit for his pain.  HTN:  His BP is OK.  However, I would like to add metoprolol low dose.  HYPERLIPIDEMIA:   Goals of therapy will be based on the above studies.    DM:  This is new onset last year.    Current medicines are reviewed at length with the patient today.  The patient does not have concerns regarding medicines.  The following changes have been made:  As above  Labs/ tests ordered today include:   Orders Placed This Encounter  Procedures  . CBC  . Basic Metabolic Panel (BMET)  . TSH  . INR/PT  . APTT  . ECHOCARDIOGRAM COMPLETE  . LEFT HEART CATHETERIZATION WITH CORONARY ANGIOGRAM     Disposition:   FU with me after the cath.     Signed, Minus Breeding, MD  10/19/2016 4:24 PM    [place]

## 2016-10-19 NOTE — Progress Notes (Signed)
*  PRELIMINARY RESULTS* Echocardiogram 2D Echocardiogram has been performed.  Leavy Cella 10/19/2016, 11:14 AM

## 2016-10-20 ENCOUNTER — Inpatient Hospital Stay (HOSPITAL_COMMUNITY)
Admission: RE | Admit: 2016-10-20 | Discharge: 2016-11-18 | DRG: 003 | Disposition: A | Discharge status: Other Acute Inpatient Hospital | Payer: Medicare Other | Source: Ambulatory Visit | Attending: Thoracic Surgery (Cardiothoracic Vascular Surgery) | Admitting: Thoracic Surgery (Cardiothoracic Vascular Surgery)

## 2016-10-20 ENCOUNTER — Encounter (HOSPITAL_COMMUNITY): Payer: Self-pay | Admitting: Cardiology

## 2016-10-20 ENCOUNTER — Other Ambulatory Visit: Payer: Self-pay | Admitting: *Deleted

## 2016-10-20 ENCOUNTER — Encounter (HOSPITAL_COMMUNITY)
Admission: RE | Disposition: A | Discharge status: Nursing Home (Includes ICF) | Payer: Self-pay | Source: Ambulatory Visit | Attending: Thoracic Surgery (Cardiothoracic Vascular Surgery)

## 2016-10-20 DIAGNOSIS — I350 Nonrheumatic aortic (valve) stenosis: Secondary | ICD-10-CM

## 2016-10-20 DIAGNOSIS — R601 Generalized edema: Secondary | ICD-10-CM | POA: Diagnosis not present

## 2016-10-20 DIAGNOSIS — E1152 Type 2 diabetes mellitus with diabetic peripheral angiopathy with gangrene: Secondary | ICD-10-CM | POA: Diagnosis not present

## 2016-10-20 DIAGNOSIS — D696 Thrombocytopenia, unspecified: Secondary | ICD-10-CM | POA: Diagnosis not present

## 2016-10-20 DIAGNOSIS — S21109A Unspecified open wound of unspecified front wall of thorax without penetration into thoracic cavity, initial encounter: Secondary | ICD-10-CM

## 2016-10-20 DIAGNOSIS — R188 Other ascites: Secondary | ICD-10-CM | POA: Diagnosis not present

## 2016-10-20 DIAGNOSIS — E1151 Type 2 diabetes mellitus with diabetic peripheral angiopathy without gangrene: Secondary | ICD-10-CM | POA: Diagnosis not present

## 2016-10-20 DIAGNOSIS — Z952 Presence of prosthetic heart valve: Secondary | ICD-10-CM

## 2016-10-20 DIAGNOSIS — I5043 Acute on chronic combined systolic (congestive) and diastolic (congestive) heart failure: Secondary | ICD-10-CM | POA: Diagnosis not present

## 2016-10-20 DIAGNOSIS — Q231 Congenital insufficiency of aortic valve: Secondary | ICD-10-CM | POA: Diagnosis not present

## 2016-10-20 DIAGNOSIS — E876 Hypokalemia: Secondary | ICD-10-CM | POA: Diagnosis not present

## 2016-10-20 DIAGNOSIS — R570 Cardiogenic shock: Secondary | ICD-10-CM | POA: Diagnosis not present

## 2016-10-20 DIAGNOSIS — I080 Rheumatic disorders of both mitral and aortic valves: Principal | ICD-10-CM | POA: Diagnosis present

## 2016-10-20 DIAGNOSIS — Z9889 Other specified postprocedural states: Secondary | ICD-10-CM

## 2016-10-20 DIAGNOSIS — Z0389 Encounter for observation for other suspected diseases and conditions ruled out: Secondary | ICD-10-CM | POA: Diagnosis not present

## 2016-10-20 DIAGNOSIS — E873 Alkalosis: Secondary | ICD-10-CM | POA: Diagnosis not present

## 2016-10-20 DIAGNOSIS — I511 Rupture of chordae tendineae, not elsewhere classified: Secondary | ICD-10-CM | POA: Diagnosis present

## 2016-10-20 DIAGNOSIS — Z79899 Other long term (current) drug therapy: Secondary | ICD-10-CM

## 2016-10-20 DIAGNOSIS — R0902 Hypoxemia: Secondary | ICD-10-CM

## 2016-10-20 DIAGNOSIS — K7200 Acute and subacute hepatic failure without coma: Secondary | ICD-10-CM | POA: Diagnosis not present

## 2016-10-20 DIAGNOSIS — R945 Abnormal results of liver function studies: Secondary | ICD-10-CM

## 2016-10-20 DIAGNOSIS — Z953 Presence of xenogenic heart valve: Secondary | ICD-10-CM | POA: Diagnosis not present

## 2016-10-20 DIAGNOSIS — Z7982 Long term (current) use of aspirin: Secondary | ICD-10-CM

## 2016-10-20 DIAGNOSIS — I5041 Acute combined systolic (congestive) and diastolic (congestive) heart failure: Secondary | ICD-10-CM | POA: Diagnosis not present

## 2016-10-20 DIAGNOSIS — J9801 Acute bronchospasm: Secondary | ICD-10-CM | POA: Diagnosis not present

## 2016-10-20 DIAGNOSIS — N400 Enlarged prostate without lower urinary tract symptoms: Secondary | ICD-10-CM | POA: Diagnosis present

## 2016-10-20 DIAGNOSIS — I200 Unstable angina: Secondary | ICD-10-CM | POA: Diagnosis present

## 2016-10-20 DIAGNOSIS — J9600 Acute respiratory failure, unspecified whether with hypoxia or hypercapnia: Secondary | ICD-10-CM | POA: Diagnosis not present

## 2016-10-20 DIAGNOSIS — I25110 Atherosclerotic heart disease of native coronary artery with unstable angina pectoris: Secondary | ICD-10-CM

## 2016-10-20 DIAGNOSIS — G9340 Encephalopathy, unspecified: Secondary | ICD-10-CM

## 2016-10-20 DIAGNOSIS — I2510 Atherosclerotic heart disease of native coronary artery without angina pectoris: Secondary | ICD-10-CM

## 2016-10-20 DIAGNOSIS — R079 Chest pain, unspecified: Secondary | ICD-10-CM | POA: Diagnosis not present

## 2016-10-20 DIAGNOSIS — I38 Endocarditis, valve unspecified: Secondary | ICD-10-CM | POA: Diagnosis not present

## 2016-10-20 DIAGNOSIS — Z9689 Presence of other specified functional implants: Secondary | ICD-10-CM

## 2016-10-20 DIAGNOSIS — Z89511 Acquired absence of right leg below knee: Secondary | ICD-10-CM | POA: Diagnosis not present

## 2016-10-20 DIAGNOSIS — I5033 Acute on chronic diastolic (congestive) heart failure: Secondary | ICD-10-CM | POA: Diagnosis not present

## 2016-10-20 DIAGNOSIS — I509 Heart failure, unspecified: Secondary | ICD-10-CM

## 2016-10-20 DIAGNOSIS — J9690 Respiratory failure, unspecified, unspecified whether with hypoxia or hypercapnia: Secondary | ICD-10-CM

## 2016-10-20 DIAGNOSIS — J811 Chronic pulmonary edema: Secondary | ICD-10-CM | POA: Diagnosis not present

## 2016-10-20 DIAGNOSIS — Z8679 Personal history of other diseases of the circulatory system: Secondary | ICD-10-CM

## 2016-10-20 DIAGNOSIS — M109 Gout, unspecified: Secondary | ICD-10-CM | POA: Diagnosis present

## 2016-10-20 DIAGNOSIS — T8111XA Postprocedural  cardiogenic shock, initial encounter: Secondary | ICD-10-CM | POA: Diagnosis not present

## 2016-10-20 DIAGNOSIS — J9601 Acute respiratory failure with hypoxia: Secondary | ICD-10-CM | POA: Diagnosis not present

## 2016-10-20 DIAGNOSIS — M79609 Pain in unspecified limb: Secondary | ICD-10-CM | POA: Diagnosis not present

## 2016-10-20 DIAGNOSIS — G2581 Restless legs syndrome: Secondary | ICD-10-CM | POA: Diagnosis present

## 2016-10-20 DIAGNOSIS — J984 Other disorders of lung: Secondary | ICD-10-CM | POA: Diagnosis not present

## 2016-10-20 DIAGNOSIS — M1990 Unspecified osteoarthritis, unspecified site: Secondary | ICD-10-CM | POA: Diagnosis present

## 2016-10-20 DIAGNOSIS — A0472 Enterocolitis due to Clostridium difficile, not specified as recurrent: Secondary | ICD-10-CM | POA: Diagnosis not present

## 2016-10-20 DIAGNOSIS — K219 Gastro-esophageal reflux disease without esophagitis: Secondary | ICD-10-CM | POA: Diagnosis present

## 2016-10-20 DIAGNOSIS — I70263 Atherosclerosis of native arteries of extremities with gangrene, bilateral legs: Secondary | ICD-10-CM | POA: Diagnosis not present

## 2016-10-20 DIAGNOSIS — I341 Nonrheumatic mitral (valve) prolapse: Secondary | ICD-10-CM | POA: Diagnosis not present

## 2016-10-20 DIAGNOSIS — D65 Disseminated intravascular coagulation [defibrination syndrome]: Secondary | ICD-10-CM | POA: Diagnosis not present

## 2016-10-20 DIAGNOSIS — Z951 Presence of aortocoronary bypass graft: Secondary | ICD-10-CM | POA: Diagnosis not present

## 2016-10-20 DIAGNOSIS — G9341 Metabolic encephalopathy: Secondary | ICD-10-CM | POA: Diagnosis not present

## 2016-10-20 DIAGNOSIS — N170 Acute kidney failure with tubular necrosis: Secondary | ICD-10-CM | POA: Diagnosis not present

## 2016-10-20 DIAGNOSIS — I4891 Unspecified atrial fibrillation: Secondary | ICD-10-CM | POA: Diagnosis not present

## 2016-10-20 DIAGNOSIS — I7121 Aneurysm of the ascending aorta, without rupture: Secondary | ICD-10-CM | POA: Diagnosis present

## 2016-10-20 DIAGNOSIS — E870 Hyperosmolality and hypernatremia: Secondary | ICD-10-CM | POA: Diagnosis not present

## 2016-10-20 DIAGNOSIS — J810 Acute pulmonary edema: Secondary | ICD-10-CM | POA: Diagnosis not present

## 2016-10-20 DIAGNOSIS — I96 Gangrene, not elsewhere classified: Secondary | ICD-10-CM | POA: Diagnosis not present

## 2016-10-20 DIAGNOSIS — D6959 Other secondary thrombocytopenia: Secondary | ICD-10-CM | POA: Diagnosis not present

## 2016-10-20 DIAGNOSIS — I110 Hypertensive heart disease with heart failure: Secondary | ICD-10-CM | POA: Diagnosis present

## 2016-10-20 DIAGNOSIS — E861 Hypovolemia: Secondary | ICD-10-CM | POA: Diagnosis not present

## 2016-10-20 DIAGNOSIS — Z4682 Encounter for fitting and adjustment of non-vascular catheter: Secondary | ICD-10-CM | POA: Diagnosis not present

## 2016-10-20 DIAGNOSIS — D6489 Other specified anemias: Secondary | ICD-10-CM | POA: Diagnosis not present

## 2016-10-20 DIAGNOSIS — J9811 Atelectasis: Secondary | ICD-10-CM | POA: Diagnosis not present

## 2016-10-20 DIAGNOSIS — K8020 Calculus of gallbladder without cholecystitis without obstruction: Secondary | ICD-10-CM | POA: Diagnosis not present

## 2016-10-20 DIAGNOSIS — I514 Myocarditis, unspecified: Secondary | ICD-10-CM | POA: Diagnosis not present

## 2016-10-20 DIAGNOSIS — D62 Acute posthemorrhagic anemia: Secondary | ICD-10-CM | POA: Diagnosis not present

## 2016-10-20 DIAGNOSIS — A419 Sepsis, unspecified organism: Secondary | ICD-10-CM | POA: Diagnosis not present

## 2016-10-20 DIAGNOSIS — I10 Essential (primary) hypertension: Secondary | ICD-10-CM | POA: Diagnosis not present

## 2016-10-20 DIAGNOSIS — I272 Pulmonary hypertension, unspecified: Secondary | ICD-10-CM | POA: Diagnosis present

## 2016-10-20 DIAGNOSIS — I998 Other disorder of circulatory system: Secondary | ICD-10-CM | POA: Diagnosis not present

## 2016-10-20 DIAGNOSIS — I712 Thoracic aortic aneurysm, without rupture: Secondary | ICD-10-CM | POA: Diagnosis present

## 2016-10-20 DIAGNOSIS — K761 Chronic passive congestion of liver: Secondary | ICD-10-CM | POA: Diagnosis not present

## 2016-10-20 DIAGNOSIS — I70262 Atherosclerosis of native arteries of extremities with gangrene, left leg: Secondary | ICD-10-CM | POA: Diagnosis not present

## 2016-10-20 DIAGNOSIS — T8131XS Disruption of external operation (surgical) wound, not elsewhere classified, sequela: Secondary | ICD-10-CM | POA: Diagnosis not present

## 2016-10-20 DIAGNOSIS — R0602 Shortness of breath: Secondary | ICD-10-CM | POA: Diagnosis not present

## 2016-10-20 DIAGNOSIS — Z89512 Acquired absence of left leg below knee: Secondary | ICD-10-CM | POA: Diagnosis not present

## 2016-10-20 DIAGNOSIS — I70261 Atherosclerosis of native arteries of extremities with gangrene, right leg: Secondary | ICD-10-CM

## 2016-10-20 DIAGNOSIS — K56609 Unspecified intestinal obstruction, unspecified as to partial versus complete obstruction: Secondary | ICD-10-CM

## 2016-10-20 DIAGNOSIS — Z452 Encounter for adjustment and management of vascular access device: Secondary | ICD-10-CM | POA: Diagnosis not present

## 2016-10-20 DIAGNOSIS — Z01810 Encounter for preprocedural cardiovascular examination: Secondary | ICD-10-CM | POA: Diagnosis not present

## 2016-10-20 DIAGNOSIS — R918 Other nonspecific abnormal finding of lung field: Secondary | ICD-10-CM | POA: Diagnosis not present

## 2016-10-20 DIAGNOSIS — J95821 Acute postprocedural respiratory failure: Secondary | ICD-10-CM | POA: Diagnosis not present

## 2016-10-20 DIAGNOSIS — Z87891 Personal history of nicotine dependence: Secondary | ICD-10-CM

## 2016-10-20 DIAGNOSIS — R740 Nonspecific elevation of levels of transaminase and lactic acid dehydrogenase [LDH]: Secondary | ICD-10-CM | POA: Diagnosis not present

## 2016-10-20 DIAGNOSIS — F05 Delirium due to known physiological condition: Secondary | ICD-10-CM | POA: Diagnosis not present

## 2016-10-20 DIAGNOSIS — I340 Nonrheumatic mitral (valve) insufficiency: Secondary | ICD-10-CM | POA: Diagnosis present

## 2016-10-20 DIAGNOSIS — I700 Atherosclerosis of aorta: Secondary | ICD-10-CM | POA: Diagnosis not present

## 2016-10-20 DIAGNOSIS — J90 Pleural effusion, not elsewhere classified: Secondary | ICD-10-CM | POA: Diagnosis not present

## 2016-10-20 DIAGNOSIS — Z931 Gastrostomy status: Secondary | ICD-10-CM | POA: Diagnosis not present

## 2016-10-20 DIAGNOSIS — E785 Hyperlipidemia, unspecified: Secondary | ICD-10-CM | POA: Diagnosis present

## 2016-10-20 DIAGNOSIS — R509 Fever, unspecified: Secondary | ICD-10-CM

## 2016-10-20 DIAGNOSIS — I480 Paroxysmal atrial fibrillation: Secondary | ICD-10-CM | POA: Diagnosis not present

## 2016-10-20 DIAGNOSIS — Z9289 Personal history of other medical treatment: Secondary | ICD-10-CM

## 2016-10-20 DIAGNOSIS — Z930 Tracheostomy status: Secondary | ICD-10-CM

## 2016-10-20 DIAGNOSIS — I4892 Unspecified atrial flutter: Secondary | ICD-10-CM | POA: Diagnosis not present

## 2016-10-20 DIAGNOSIS — Z7984 Long term (current) use of oral hypoglycemic drugs: Secondary | ICD-10-CM

## 2016-10-20 DIAGNOSIS — Z9911 Dependence on respirator [ventilator] status: Secondary | ICD-10-CM | POA: Diagnosis not present

## 2016-10-20 DIAGNOSIS — R7989 Other specified abnormal findings of blood chemistry: Secondary | ICD-10-CM

## 2016-10-20 DIAGNOSIS — Y838 Other surgical procedures as the cause of abnormal reaction of the patient, or of later complication, without mention of misadventure at the time of the procedure: Secondary | ICD-10-CM | POA: Diagnosis not present

## 2016-10-20 DIAGNOSIS — R5381 Other malaise: Secondary | ICD-10-CM | POA: Diagnosis not present

## 2016-10-20 DIAGNOSIS — I9789 Other postprocedural complications and disorders of the circulatory system, not elsewhere classified: Secondary | ICD-10-CM

## 2016-10-20 DIAGNOSIS — R5082 Postprocedural fever: Secondary | ICD-10-CM | POA: Diagnosis not present

## 2016-10-20 DIAGNOSIS — Z4659 Encounter for fitting and adjustment of other gastrointestinal appliance and device: Secondary | ICD-10-CM

## 2016-10-20 HISTORY — DX: Gastro-esophageal reflux disease without esophagitis: K21.9

## 2016-10-20 HISTORY — DX: Congenital insufficiency of aortic valve: Q23.1

## 2016-10-20 HISTORY — DX: Bicuspid aortic valve: Q23.81

## 2016-10-20 HISTORY — DX: Presence of xenogenic heart valve: Z95.3

## 2016-10-20 HISTORY — DX: Atherosclerotic heart disease of native coronary artery with unstable angina pectoris: I25.110

## 2016-10-20 HISTORY — DX: Presence of aortocoronary bypass graft: Z95.1

## 2016-10-20 HISTORY — DX: Thrombocytopenia, unspecified: D69.6

## 2016-10-20 HISTORY — DX: Other postprocedural complications and disorders of the circulatory system, not elsewhere classified: I97.89

## 2016-10-20 HISTORY — DX: Type 2 diabetes mellitus without complications: E11.9

## 2016-10-20 HISTORY — DX: Thoracic aortic aneurysm, without rupture: I71.2

## 2016-10-20 HISTORY — DX: Personal history of other diseases of the circulatory system: Z86.79

## 2016-10-20 HISTORY — DX: Cardiac murmur, unspecified: R01.1

## 2016-10-20 HISTORY — DX: Unspecified atrial fibrillation: I48.91

## 2016-10-20 HISTORY — DX: Other specified postprocedural states: Z98.890

## 2016-10-20 HISTORY — PX: CARDIAC CATHETERIZATION: SHX172

## 2016-10-20 HISTORY — DX: Nonrheumatic aortic (valve) stenosis: I35.0

## 2016-10-20 LAB — URINALYSIS, ROUTINE W REFLEX MICROSCOPIC
BILIRUBIN URINE: NEGATIVE
Glucose, UA: NEGATIVE mg/dL
Hgb urine dipstick: NEGATIVE
KETONES UR: NEGATIVE mg/dL
LEUKOCYTES UA: NEGATIVE
NITRITE: NEGATIVE
PH: 5 (ref 5.0–8.0)
PROTEIN: NEGATIVE mg/dL
Specific Gravity, Urine: 1.009 (ref 1.005–1.030)

## 2016-10-20 LAB — POCT I-STAT 3, ART BLOOD GAS (G3+)
Acid-base deficit: 2 mmol/L (ref 0.0–2.0)
Bicarbonate: 22.5 mmol/L (ref 20.0–28.0)
O2 Saturation: 98 %
PCO2 ART: 38.6 mmHg (ref 32.0–48.0)
PH ART: 7.373 (ref 7.350–7.450)
PO2 ART: 117 mmHg — AB (ref 83.0–108.0)
TCO2: 24 mmol/L (ref 0–100)

## 2016-10-20 LAB — GLUCOSE, CAPILLARY
GLUCOSE-CAPILLARY: 147 mg/dL — AB (ref 65–99)
Glucose-Capillary: 117 mg/dL — ABNORMAL HIGH (ref 65–99)
Glucose-Capillary: 151 mg/dL — ABNORMAL HIGH (ref 65–99)

## 2016-10-20 LAB — POCT I-STAT 3, VENOUS BLOOD GAS (G3P V)
Acid-base deficit: 1 mmol/L (ref 0.0–2.0)
Acid-base deficit: 4 mmol/L — ABNORMAL HIGH (ref 0.0–2.0)
BICARBONATE: 22.1 mmol/L (ref 20.0–28.0)
BICARBONATE: 24.6 mmol/L (ref 20.0–28.0)
O2 Saturation: 63 %
O2 Saturation: 62 %
PCO2 VEN: 42.4 mmHg — AB (ref 44.0–60.0)
PCO2 VEN: 44.5 mmHg (ref 44.0–60.0)
PH VEN: 7.326 (ref 7.250–7.430)
PH VEN: 7.35 (ref 7.250–7.430)
PO2 VEN: 34 mmHg (ref 32.0–45.0)
TCO2: 23 mmol/L (ref 0–100)
TCO2: 26 mmol/L (ref 0–100)
pO2, Ven: 35 mmHg (ref 32.0–45.0)

## 2016-10-20 LAB — PROTIME-INR
INR: 1.1
Prothrombin Time: 11.2 s (ref 9.0–11.5)

## 2016-10-20 LAB — TSH: TSH: 1.93 m[IU]/L (ref 0.40–4.50)

## 2016-10-20 LAB — APTT: aPTT: 34 s (ref 22–34)

## 2016-10-20 SURGERY — RIGHT/LEFT HEART CATH AND CORONARY ANGIOGRAPHY

## 2016-10-20 MED ORDER — ENALAPRIL MALEATE 20 MG PO TABS
20.0000 mg | ORAL_TABLET | Freq: Two times a day (BID) | ORAL | Status: DC
  Administered 2016-10-20 – 2016-10-21 (×4): 20 mg via ORAL
  Filled 2016-10-20 (×5): qty 1

## 2016-10-20 MED ORDER — SODIUM CHLORIDE 0.9% FLUSH: 3.0000 mL | INTRAVENOUS | Status: DC | PRN

## 2016-10-20 MED ORDER — HEPARIN (PORCINE) IN NACL 100-0.45 UNIT/ML-% IJ SOLN
1500.0000 [IU]/h | INTRAMUSCULAR | Status: DC
  Administered 2016-10-20: 1300 [IU]/h via INTRAVENOUS
  Administered 2016-10-21: 1500 [IU]/h via INTRAVENOUS
  Filled 2016-10-20 (×2): qty 250

## 2016-10-20 MED ORDER — SODIUM CHLORIDE 0.9 % IV SOLN: 250.0000 mL | INTRAVENOUS | Status: DC | PRN

## 2016-10-20 MED ORDER — SODIUM CHLORIDE 0.9 % WEIGHT BASED INFUSION
3.0000 mL/kg/h | INTRAVENOUS | Status: AC
Start: 2016-10-20 — End: 2016-10-20
  Administered 2016-10-20: 3 mL/kg/h via INTRAVENOUS

## 2016-10-20 MED ORDER — METOPROLOL SUCCINATE ER 25 MG PO TB24
25.0000 mg | ORAL_TABLET | Freq: Every day | ORAL | Status: DC
  Administered 2016-10-20 – 2016-10-21 (×2): 25 mg via ORAL
  Filled 2016-10-20 (×2): qty 1

## 2016-10-20 MED ORDER — ALLOPURINOL 300 MG PO TABS
300.0000 mg | ORAL_TABLET | Freq: Every day | ORAL | Status: DC
Start: 2016-10-20 — End: 2016-10-22
  Administered 2016-10-20 – 2016-10-21 (×2): 300 mg via ORAL
  Filled 2016-10-20 (×2): qty 1

## 2016-10-20 MED ORDER — ACETAMINOPHEN 325 MG PO TABS: 650.0000 mg | ORAL_TABLET | ORAL | Status: DC | PRN

## 2016-10-20 MED ORDER — HEPARIN (PORCINE) IN NACL 2-0.9 UNIT/ML-% IJ SOLN
INTRAMUSCULAR | Status: DC | PRN
  Administered 2016-10-20: 1500 mL

## 2016-10-20 MED ORDER — FENTANYL CITRATE (PF) 100 MCG/2ML IJ SOLN
INTRAMUSCULAR | Status: AC
  Filled 2016-10-20: qty 2

## 2016-10-20 MED ORDER — HEPARIN (PORCINE) IN NACL 2-0.9 UNIT/ML-% IJ SOLN
INTRAMUSCULAR | Status: AC
  Filled 2016-10-20: qty 1000

## 2016-10-20 MED ORDER — IOPAMIDOL (ISOVUE-370) INJECTION 76%
INTRAVENOUS | Status: DC | PRN
  Administered 2016-10-20: 40 mL via INTRAVENOUS

## 2016-10-20 MED ORDER — HEPARIN SODIUM (PORCINE) 1000 UNIT/ML IJ SOLN
INTRAMUSCULAR | Status: DC | PRN
  Administered 2016-10-20: 5000 [IU] via INTRAVENOUS

## 2016-10-20 MED ORDER — MIDAZOLAM HCL 2 MG/2ML IJ SOLN
INTRAMUSCULAR | Status: DC | PRN
  Administered 2016-10-20: 2 mg via INTRAVENOUS

## 2016-10-20 MED ORDER — GEMFIBROZIL 600 MG PO TABS
600.0000 mg | ORAL_TABLET | Freq: Two times a day (BID) | ORAL | Status: DC
Start: 2016-10-20 — End: 2016-10-22
  Administered 2016-10-20 – 2016-10-21 (×3): 600 mg via ORAL
  Filled 2016-10-20 (×4): qty 1

## 2016-10-20 MED ORDER — ASPIRIN EC 81 MG PO TBEC
81.0000 mg | DELAYED_RELEASE_TABLET | Freq: Every day | ORAL | Status: DC
Start: 2016-10-21 — End: 2016-10-22
  Administered 2016-10-21: 10:00:00 81 mg via ORAL
  Filled 2016-10-20: qty 1

## 2016-10-20 MED ORDER — HEPARIN SODIUM (PORCINE) 1000 UNIT/ML IJ SOLN
INTRAMUSCULAR | Status: AC
  Filled 2016-10-20: qty 1

## 2016-10-20 MED ORDER — FINASTERIDE 5 MG PO TABS
5.0000 mg | ORAL_TABLET | Freq: Every day | ORAL | Status: DC
  Administered 2016-10-20 – 2016-10-21 (×2): 5 mg via ORAL
  Filled 2016-10-20 (×2): qty 1

## 2016-10-20 MED ORDER — SODIUM CHLORIDE 0.9% FLUSH
3.0000 mL | Freq: Two times a day (BID) | INTRAVENOUS | Status: DC
  Administered 2016-10-20 – 2016-10-21 (×3): 3 mL via INTRAVENOUS

## 2016-10-20 MED ORDER — SODIUM CHLORIDE 0.9% FLUSH: 3.0000 mL | Freq: Two times a day (BID) | INTRAVENOUS | Status: DC

## 2016-10-20 MED ORDER — MELATONIN 3 MG PO TABS
9.0000 mg | ORAL_TABLET | Freq: Every day | ORAL | Status: DC
Start: 2016-10-20 — End: 2016-10-22
  Administered 2016-10-20 – 2016-10-21 (×2): 9 mg via ORAL
  Filled 2016-10-20 (×2): qty 3

## 2016-10-20 MED ORDER — LIDOCAINE HCL (PF) 1 % IJ SOLN
INTRAMUSCULAR | Status: DC | PRN
  Administered 2016-10-20: 3 mL
  Administered 2016-10-20: 1 mL via SUBCUTANEOUS

## 2016-10-20 MED ORDER — HEPARIN (PORCINE) IN NACL 2-0.9 UNIT/ML-% IJ SOLN
INTRAMUSCULAR | Status: DC | PRN
  Administered 2016-10-20: 10 mL via INTRA_ARTERIAL

## 2016-10-20 MED ORDER — SODIUM CHLORIDE 0.9 % WEIGHT BASED INFUSION: 1.0000 mL/kg/h | INTRAVENOUS | Status: DC

## 2016-10-20 MED ORDER — SODIUM CHLORIDE 0.9 % IV SOLN: INTRAVENOUS | Status: AC

## 2016-10-20 MED ORDER — IOPAMIDOL (ISOVUE-370) INJECTION 76%
INTRAVENOUS | Status: AC
  Filled 2016-10-20: qty 100

## 2016-10-20 MED ORDER — ASPIRIN EC 81 MG PO TBEC: 81.0000 mg | DELAYED_RELEASE_TABLET | Freq: Two times a day (BID) | ORAL | Status: DC

## 2016-10-20 MED ORDER — ASPIRIN 81 MG PO CHEW
CHEWABLE_TABLET | ORAL | Status: AC
  Administered 2016-10-20: 81 mg
  Filled 2016-10-20: qty 1

## 2016-10-20 MED ORDER — ROPINIROLE HCL 1 MG PO TABS
2.0000 mg | ORAL_TABLET | Freq: Two times a day (BID) | ORAL | Status: DC
  Administered 2016-10-20 – 2016-10-21 (×4): 2 mg via ORAL
  Filled 2016-10-20 (×4): qty 2

## 2016-10-20 MED ORDER — ROPINIROLE HCL 1 MG PO TABS
2.0000 mg | ORAL_TABLET | ORAL | Status: DC
  Filled 2016-10-20: qty 2

## 2016-10-20 MED ORDER — MIDAZOLAM HCL 2 MG/2ML IJ SOLN
INTRAMUSCULAR | Status: AC
  Filled 2016-10-20: qty 2

## 2016-10-20 MED ORDER — VERAPAMIL HCL 2.5 MG/ML IV SOLN
INTRAVENOUS | Status: AC
  Filled 2016-10-20: qty 2

## 2016-10-20 MED ORDER — FENTANYL CITRATE (PF) 100 MCG/2ML IJ SOLN
INTRAMUSCULAR | Status: DC | PRN
  Administered 2016-10-20: 25 ug via INTRAVENOUS

## 2016-10-20 MED ORDER — LIDOCAINE HCL (PF) 1 % IJ SOLN
INTRAMUSCULAR | Status: AC
  Filled 2016-10-20: qty 30

## 2016-10-20 MED ORDER — ONDANSETRON HCL 4 MG/2ML IJ SOLN
4.0000 mg | Freq: Four times a day (QID) | INTRAMUSCULAR | Status: DC | PRN
Start: 2016-10-20 — End: 2016-10-22

## 2016-10-20 MED ORDER — NITROGLYCERIN 0.4 MG SL SUBL
0.4000 mg | SUBLINGUAL_TABLET | SUBLINGUAL | Status: DC | PRN
  Filled 2016-10-20: qty 1

## 2016-10-20 SURGICAL SUPPLY — 13 items
CATH BALLN WEDGE 5F 110CM (CATHETERS) ×2 IMPLANT
CATH INFINITI 5FR AL1 (CATHETERS) ×2 IMPLANT
CATH OPTITORQUE TIG 4.0 5F (CATHETERS) ×2 IMPLANT
DEVICE RAD COMP TR BAND LRG (VASCULAR PRODUCTS) ×2 IMPLANT
GLIDESHEATH SLEND A-KIT 6F 22G (SHEATH) ×2 IMPLANT
GUIDEWIRE INQWIRE 1.5J.035X260 (WIRE) IMPLANT
INQWIRE 1.5J .035X260CM (WIRE) ×3
KIT HEART LEFT (KITS) ×3 IMPLANT
PACK CARDIAC CATHETERIZATION (CUSTOM PROCEDURE TRAY) ×3 IMPLANT
SHEATH FAST CATH BRACH 5F 5CM (SHEATH) ×2 IMPLANT
TRANSDUCER W/STOPCOCK (MISCELLANEOUS) ×4 IMPLANT
TUBING CIL FLEX 10 FLL-RA (TUBING) ×3 IMPLANT
WIRE EMERALD ST .035X260CM (WIRE) ×2 IMPLANT

## 2016-10-20 NOTE — Research (Signed)
CADLAD Informed Consent   Subject Name: Scott [name]  Subject met inclusion and exclusion criteria.  The informed consent form, study requirements and expectations were reviewed with the subject and questions and concerns were addressed prior to the signing of the consent form.  The subject verbalized understanding of the trail requirements.  The subject agreed to participate in the CADLAD trial and signed the informed consent.  The informed consent was obtained prior to performance of any protocol-specific procedures for the subject.  A copy of the signed informed consent was given to the subject and a copy was placed in the subject's medical record.  Mable Fill, Marissa Nestle 10/20/2016, 08:44am

## 2016-10-20 NOTE — Progress Notes (Signed)
ANTICOAGULATION CONSULT NOTE - Initial Consult  Pharmacy Consult for Heparin Indication: chest pain/ACS, awaiting CABG/AVR evaluation  Allergies  Allergen Reactions  . Doxycycline Hives  . Penicillins Hives    Has patient had a PCN reaction causing immediate rash, facial/tongue/throat swelling, SOB or lightheadedness with hypotension: No Has patient had a PCN reaction causing severe rash involving mucus membranes or skin necrosis: No Has patient had a PCN reaction that required hospitalization: No Has patient had a PCN reaction occurring within the last 10 years: Yes If all of the above answers are "NO", then may proceed with Cephalosporin use.   . Sulfa Antibiotics Hives    Patient Measurements: Height: 5\' 9"  (175.3 cm) Weight: 215 lb (97.5 kg) IBW/kg (Calculated) : 70.7  Vital Signs: Temp: 97.1 F (36.2 C) (11/28 1057) Temp Source: Axillary (11/28 1057) BP: 122/75 (11/28 1057) Pulse Rate: 62 (11/28 1057)  Labs:  Recent Labs  10/19/16 1123  HGB 15.1  HCT 43.1  PLT 153  APTT 34  LABPROT 11.2  INR 1.1  CREATININE 0.87    Estimated Creatinine Clearance: 91 mL/min (by C-G formula based on SCr of 0.87 mg/dL).   Medical History: Past Medical History:  Diagnosis Date  . Arthritis   . BPH (benign prostatic hypertrophy)   . Carpal tunnel syndrome of right wrist   . Diabetes ([place])   . Diverticulitis   . Gout   . Hyperlipemia   . Hypertension   . RLS (restless legs syndrome)   . Snores    Never been tested for sleep apnea  . Trigger ring finger of right hand     Assessment: 70 year old male s/p cath to begin heparin this evening awaiting evaluation for CABG / AVR surgery.  Goal of Therapy:  Heparin level 0.3-0.7 units/ml Monitor platelets by anticoagulation protocol: Yes   Plan:  Heparin at 1300 units / hr starting at 1830 pm Daily heparin level, CBC  Thank you Anette Guarneri, PharmD [phone]  Tad Moore 10/20/2016,11:24 AM

## 2016-10-20 NOTE — H&P (Signed)
Cardiology Office Note   Date:  10/19/2016   ID:  Scott [name], DOB [birth year], MRN [id number]  PCP:  Manon Hilding, MD             Cardiologist:   Minus Breeding, MD  Referring:  Manon Hilding, MD     Chief Complaint  Patient presents with  . Chest Pain     History of Present Illness: Scott [name] is a 70 y.o. male who presents for evaluation of chest pain. He was an add-on this morning. He has no prior cardiac history other than it turns out a murmur and he thinks he might have had an ultrasound years ago. He started noticing chest pain about 2 months ago. It happened first when he would eat or bend over. However, it started happening with exertion. It has progressed to happen with less and less exertion. It is a slightly more intense discomfort. It is 4 out of 10 and midsternal. It feels like an intense indigestion. He gets somewhat short of breath with it. There is no radiation to his discharge to his arms. He does not have nausea vomiting or diaphoresis. Doesn't have palpitations, presyncope or syncope. He's had no weight gain or edema.  Past Medical History:  Diagnosis Date  . Arthritis   . BPH (benign prostatic hypertrophy)   . Carpal tunnel syndrome of right wrist   . Diabetes ([place])   . Diverticulitis   . Gout   . Hyperlipemia   . Hypertension   . RLS (restless legs syndrome)   . Snores    Never been tested for sleep apnea  . Trigger ring finger of right hand          Past Surgical History:  Procedure Laterality Date  . CARPAL TUNNEL RELEASE Right 11/28/2013   Procedure: RIGHT WRIST CARPAL TUNNEL RELEASE;  Surgeon: Lorn Junes, MD;  Location: [facility];  Service: Orthopedics;  Laterality: Right;  . COLONOSCOPY    . EYE SURGERY  1978   both cataracts  . HERNIA REPAIR  1998   rt ing  . LIPOMA EXCISION  2008   rt head  . PENECTOMY  2007   for pernones  . PENILE PROSTHESIS IMPLANT  2009  . SHOULDER  ARTHROSCOPY W/ ROTATOR CUFF REPAIR  2006   right  . TONSILLECTOMY    . TRANSURETHRAL RESECTION OF PROSTATE  2005  . TRIGGER FINGER RELEASE     several lt and rt hands  . TRIGGER FINGER RELEASE Right 11/28/2013   Procedure: RIGHT TRIGGER FINGER  RELEASE (TENDON SHEATH INCISION);  Surgeon: Lorn Junes, MD;  Location: [facility];  Service: Orthopedics;  Laterality: Right;           Current Outpatient Prescriptions  Medication Sig Dispense Refill  . allopurinol (ZYLOPRIM) 300 MG tablet Take 300 mg by mouth daily.    Marland Kitchen aspirin 81 MG tablet Take 81 mg by mouth 2 (two) times daily.     . Calcium-Magnesium-Zinc (CAL-MAG-ZINC PO) Take 1-2 tablets by mouth 2 (two) times daily. 1 tablet in the AM and 2 tablets in the PM.    . Cholecalciferol (VITAMIN D3) 1000 units CAPS Take 1,000 Units by mouth at bedtime.     . enalapril (VASOTEC) 20 MG tablet Take 20 mg by mouth 2 (two) times daily.    . finasteride (PROSCAR) 5 MG tablet Take 5 mg by mouth daily.     Marland Kitchen gemfibrozil (LOPID) 600  MG tablet Take 600 mg by mouth 2 (two) times daily before a meal.    . Melatonin 10 MG CAPS Take 10 mg by mouth at bedtime.     . metFORMIN (GLUCOPHAGE-XR) 500 MG 24 hr tablet Take 500 mg by mouth at bedtime.     . nitroGLYCERIN (NITROSTAT) 0.4 MG SL tablet Place 0.4 mg under the tongue every 5 (five) minutes as needed.     . Omega-3 Fatty Acids (FISH OIL) 1000 MG CPDR Take 2,000 mg by mouth 2 (two) times daily.     Marland Kitchen rOPINIRole (REQUIP) 2 MG tablet Take 2 mg by mouth See admin instructions. Takes 2mg  at 4:30pm and 2mg  at 9:30pm.    . Turmeric 500 MG CAPS Take 500 mg by mouth 2 (two) times daily.     . vitamin E 400 UNIT capsule Take 400 Units by mouth daily.    . metoprolol succinate (TOPROL XL) 25 MG 24 hr tablet Take 1 tablet (25 mg total) by mouth daily. 30 tablet 11   No current facility-administered medications for this visit.     Allergies:    Doxycycline; Penicillins; and Sulfa antibiotics    Social History:  The patient  reports that he has never smoked. He does not have any smokeless tobacco history on file. He reports that he drinks alcohol. He reports that he does not use drugs.   Family History:  The patient's family history includes Cancer in his sister; Clotting disorder in his father; Heart disease (age of onset: 33) in his sister; Lung cancer in his mother.    ROS:  Please see the history of present illness.   Otherwise, review of systems are positive for none.   All other systems are reviewed and negative.    PHYSICAL EXAM: VS:  BP 118/78   Pulse 67   Ht 5\' 9"  (1.753 m)   Wt 217 lb (98.4 kg)   BMI 32.05 kg/m  , BMI Body mass index is 32.05 kg/m. GENERAL:  Well appearing HEENT:  Pupils equal round and reactive, fundi not visualized, oral mucosa unremarkable NECK:  No jugular venous distention, waveform within normal limits, carotid upstroke brisk and symmetric, no bruits, no thyromegaly LYMPHATICS:  No cervical, inguinal adenopathy LUNGS:  Clear to auscultation bilaterally BACK:  No CVA tenderness CHEST:  Unremarkable HEART:  PMI not displaced or sustained,S1 and S2 within normal limits, no S3, no S4, no clicks, no rubs, 3 out of 6 apical systolic murmur mid peaking and radiating out the aortic outflow tract, no diastolic murmurs ABD:  Flat, positive bowel sounds normal in frequency in pitch, no bruits, no rebound, no guarding, no midline pulsatile mass, no hepatomegaly, no splenomegaly EXT:  2 plus pulses throughout, no edema, no cyanosis no clubbing SKIN:  No rashes no nodules NEURO:  Cranial nerves II through XII grossly intact, motor grossly intact throughout PSYCH:  Cognitively intact, oriented to person place and time    EKG:  EKG is not ordered today. The ekg ordered 11/23/17demonstrates sinus rhythm, 76, axis within normal limits, intervals within normal limits, nonspecific inferolateral  T-wave flattening, poor anterior R-wave progression   Recent Labs: No results found for requested labs within last 8760 hours.    Lipid Panel Labs (Brief)  No results found for: CHOL, TRIG, HDL, CHOLHDL, VLDL, LDLCALC, LDLDIRECT         Wt Readings from Last 3 Encounters:  10/19/16 217 lb (98.4 kg)  11/28/13 220 lb (99.8 kg)  11/28/13 210  lb (95.3 kg)      Other studies Reviewed: Additional studies/ records that were reviewed today include: EKG. Review of the above records demonstrates:  Please see elsewhere in the note.     ASSESSMENT AND PLAN:  CHEST PAIN:  Consistent with new onset exertional angina. Given this cardiac cath is indicated.  The patient understands that risks included but are not limited to stroke (1 in 1000), death (1 in 78), kidney failure [usually temporary] (1 in 500), bleeding (1 in 200), allergic reaction [possibly serious] (1 in 200).  The patient understands and agrees to proceed.   The patient will be taking ASA.  I will start a low dose of beta blocker.  He will be given SLNTG.   We will plan a cardiac cath later this week.  MURMUR:  Before completing the catheterization he will need an echocardiogram as he has aortic stenosis I suspect is at least moderate and could be the culprit for his pain.  HTN:  His BP is OK.  However, I would like to add metoprolol low dose.  HYPERLIPIDEMIA:   Goals of therapy will be based on the above studies.    DM:  This is new onset last year.    Current medicines are reviewed at length with the patient today.  The patient does not have concerns regarding medicines.  The following changes have been made:  As above  Labs/ tests ordered today include:      Orders Placed This Encounter  Procedures  . CBC  . Basic Metabolic Panel (BMET)  . TSH  . INR/PT  . APTT  . ECHOCARDIOGRAM COMPLETE  . LEFT HEART CATHETERIZATION WITH CORONARY ANGIOGRAM     Disposition:   FU with me after the cath.       Signed, Minus Breeding, MD  10/19/2016 4:24 PM    [place]

## 2016-10-20 NOTE — Interval H&P Note (Signed)
History and Physical Interval Note:  10/20/2016 9:45 AM  Scott [name]  has presented today for surgery, with the diagnosis of arotic stenosis and possible stable angina  -- changed from LCP to RLCP. The various methods of treatment have been discussed with the patient and family. After consideration of risks, benefits and other options for treatment, the patient has consented to  Procedure(s): Right/Left Heart Cath and Coronary Angiography (N/A) with possible PCI as a surgical intervention .  The patient's history has been reviewed, patient examined, no change in status, stable for surgery.  I have reviewed the patient's chart and labs.  Questions were answered to the patient's satisfaction.    Cath Lab Visit (complete for each Cath Lab visit)  Clinical Evaluation Leading to the Procedure:   ACS: Yes.   - Unstable Angina  Non-ACS:    Anginal Classification: CCS IV  Anti-ischemic medical therapy: No Therapy  Non-Invasive Test Results: Equivocal test results -- Echo with Severe AS  Prior CABG: No previous CABG   Glenetta Hew

## 2016-10-20 NOTE — Brief Op Note (Signed)
   NAME:  Scott [name]   MRN: [id number] DOB:  1945-12-26   ADMIT DATE: 10/20/2016  Brief Cardiac Catheterization Note:  Indication: 1. Unstable Angina 2. Severe Aortic Stenosis  Procedures: 1. Right & Left Catheterization with Native Coroanry Angiography via Right Radial Artery & R Brachial Vein access  Right brachial vein access: Exchanged existing brachial 5 French short sheath over wire using Seldinger technique  5 French right heart catheter advanced under fluoroscopy into the RA, RV, PA into the PCWP position for measurement of pressures  PA sat check x 2 for FICK CO/CI  Right radial access using Seldinger technique and micropuncture Kit. - Radial cocktail administered along with thousand units of heparin  TIG 4.0 Catheter advanced over long exchange wire into the ascending aorta and used to engage the left and right coronary artery for cineangiography. Selective shots of the LAD and circumflex were performed.  Unable to cross with take 4.0 catheter, exchanged for a AL-1 catheter:  After several attempts (ensuring no more than 2 minutes with catheter and wire in position), we were able to cross the aortic valve and advance the AL-1 catheter into the LV for measurement of hemodynamics and aortic valve pullback.  Medications:  2 mg Versed IV; 25 mcg Fentanyl IV  Radial cocktail: 3 mg IV verapamil in 10 mL normal saline  Heparin 5000 units IV  Impression:  Severe bifurcation [place] 1,1,1 LAD-D3 lesion (99%pre, 90% post, 80% ostD1)  Left Dominant System with normal Cx - Large Lateral OM with LP1&2 & LPDA.  Severe Aortic Stenosis:   Peak-peak gradient 57 mmHg. Mean gradient 53 mmHg.   Estimated Aortic Valve Area  0.52 cm.  Hemodynamics:   Central AoP/MAP: 127/66/90 mmHg mmHg  LVP/EDP: 184/9/38 mmHg: Severe diastolic dysfunction from aortic stenosis  PCWP: 30/30 mmHg   PAP/mean: 45/18/29 mmHg  RVP/EDP: 49/33/9 mmHg  RAP/mean: 10/7/6 mmHg  Cardiac  output/index by Fick: 3.94, 1.5  Recommendations:  With severe bifurcation LAD disease and severe/critical aortic stenosis, best option is CABG/AVR -> will consult CT yes.  I don't feel couple discharge the patient 90-99% lesion and active unstable angina -> will admit to 6 central postprocedure unit for standard TR band removal.  Initiate IV heparin therapy tonight.  Full note to follow  Glenetta Hew, M.D., M.S. Interventional Cardiologist   Pager # [phone] Phone # [phone] [address] [place], [place] [address]  10/20/2016 10:42 AM

## 2016-10-20 NOTE — Consult Note (Addendum)
[address]       [place] [address]             [phone]          CARDIOTHORACIC SURGERY CONSULTATION REPORT  PCP is Manon Hilding, MD Referring Provider is Ellyn Hack, Leonie Green, MD Primary Cardiologist is Minus Breeding, MD  Reason for consultation:  Severe aortic stenosis and coronary artery disease  HPI:  Patient is a 70 year old male with no previous history of coronary artery disease but known history of heart murmur and risk factors notable for history of type 2 diabetes mellitus, hyperlipidemia, and remote history of tobacco use who has been referred for surgical consultation to discuss management of severe symptomatic aortic stenosis and coronary artery disease with unstable angina pectoris. The patient states he was in his usual state of health until approximately 2 months ago when he first began to experience symptoms of exertional chest pain and shortness of breath. Symptoms initially seemed to happen when he bent over at the waist, but he rapidly noticed increasing symptoms of substernal chest pain and shortness of breath that were brought on with physical exertion and relieved by rest. Symptoms progressed in frequency and severity over the last 2 months. Symptoms are described as midsternal pain which the patient initially attributed to intense indigestion. He denies any prolonged episodes of chest pain and reports that symptoms are usually relieved within a few minutes of rest. He denies any associated symptoms of shortness of breath in the absence of ongoing chest discomfort. He denies any symptoms of chest pain occurring at rest. He denies any nocturnal angina. He denies any history of palpitations, dizzy spells, or syncope. He was referred for cardiology consultation and evaluated in the office yesterday by Dr. Percival Spanish. Transthoracic echocardiogram revealed functionally bicuspid aortic valve with severe aortic stenosis. Left ventricular systolic function  remained preserved. The patient was scheduled for diagnostic cardiac catheterization performed earlier today by Dr. Ellyn Hack. The patient was found to have severe aortic stenosis with severe multivessel coronary artery disease including critical 99% stenosis of the mid left anterior descending coronary artery. The patient was admitted to the hospital and urgent or thoracic surgical consultation requested.  The patient is married and lives with his wife in [place], [place].  They have 3 grown children and numerous grandchildren. 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  The patient has remained physically active during retirement and reports no significant physical limitations up until recently.  Past Medical History:  Diagnosis Date  . Arthritis   . Bicuspid aortic valve   . BPH (benign prostatic hypertrophy)   . Carpal tunnel syndrome of right wrist   . Coronary artery disease involving native coronary artery of native heart with unstable angina pectoris ([place]) 10/20/2016  . Diverticulitis   . Gout   . Hyperlipemia   . Hypertension   . RLS (restless legs syndrome)   . Severe aortic stenosis   . Snores    Never been tested for sleep apnea  . Trigger ring finger of right hand   . Type II diabetes mellitus ([place])     Past Surgical History:  Procedure Laterality Date  . CARDIAC CATHETERIZATION N/A 10/20/2016   Procedure: Right/Left Heart Cath and Coronary Angiography;  Surgeon: Leonie Man, MD;  Location: [facility];  Service: Cardiovascular;  Laterality: N/A;  . CARPAL TUNNEL RELEASE Right  11/28/2013   Procedure: RIGHT WRIST CARPAL TUNNEL RELEASE;  Surgeon: Lorn Junes, MD;  Location: [facility];  Service: Orthopedics;  Laterality: Right;  . CATARACT EXTRACTION, BILATERAL Bilateral 1978  . COLONOSCOPY    . INGUINAL HERNIA REPAIR Right 1998  . LIPOMA EXCISION   2008   rt head  . PENECTOMY  2007   for pernones  . PENILE PROSTHESIS IMPLANT  2009  . SHOULDER ARTHROSCOPY W/ ROTATOR CUFF REPAIR  2006   right  . TONSILLECTOMY    . TRANSURETHRAL RESECTION OF PROSTATE  2005  . TRIGGER FINGER RELEASE Bilateral    several lt and rt hands  . TRIGGER FINGER RELEASE Right 11/28/2013   Procedure: RIGHT TRIGGER FINGER  RELEASE (TENDON SHEATH INCISION);  Surgeon: Lorn Junes, MD;  Location: [facility];  Service: Orthopedics;  Laterality: Right;    Family History  Problem Relation Age of Onset  . Lung cancer Mother   . Clotting disorder Father     No details  . Heart disease Sister 52    Stents  . Cancer Sister     Throat    Social History   Social History  . Marital status: Married    Spouse name: N/A  . Number of children: N/A  . Years of education: N/A   Occupational History  . [employer]     Social History Main Topics  . Smoking status: Never Smoker  . Smokeless tobacco: Not on file  . Alcohol use Yes     Comment: daily beer-3??more  . Drug use: No  . Sexual activity: Not on file   Other Topics Concern  . Not on file   Social History Narrative  . No narrative on file    Prior to Admission medications   Medication Sig Start Date End Date Taking? Authorizing Provider  allopurinol (ZYLOPRIM) 300 MG tablet Take 300 mg by mouth daily.   Yes Historical Provider, MD  aspirin 81 MG tablet Take 81 mg by mouth 2 (two) times daily.    Yes Historical Provider, MD  Calcium-Magnesium-Zinc (CAL-MAG-ZINC PO) Take 1-2 tablets by mouth 2 (two) times daily. 1 tablet in the AM and 2 tablets in the PM.   Yes Historical Provider, MD  Cholecalciferol (VITAMIN D3) 1000 units CAPS Take 1,000 Units by mouth at bedtime.    Yes Historical Provider, MD  enalapril (VASOTEC) 20 MG tablet Take 20 mg by mouth 2 (two) times daily.   Yes Historical Provider, MD  finasteride (PROSCAR) 5 MG tablet Take 5 mg by mouth daily.    Yes Historical  Provider, MD  gemfibrozil (LOPID) 600 MG tablet Take 600 mg by mouth 2 (two) times daily before a meal.   Yes Historical Provider, MD  Melatonin 10 MG CAPS Take 10 mg by mouth at bedtime.    Yes Historical Provider, MD  metFORMIN (GLUCOPHAGE-XR) 500 MG 24 hr tablet Take 500 mg by mouth at bedtime.  08/21/16  Yes Historical Provider, MD  metoprolol succinate (TOPROL XL) 25 MG 24 hr tablet Take 1 tablet (25 mg total) by mouth daily. 10/19/16  Yes Minus Breeding, MD  Omega-3 Fatty Acids (FISH OIL) 1000 MG CPDR Take 2,000 mg by mouth 2 (two) times daily.    Yes Historical Provider, MD  rOPINIRole (REQUIP) 2 MG tablet Take 2 mg by mouth See admin instructions. Takes 40m at 4:30pm and 274mat 9:30pm.   Yes Historical Provider, MD  Turmeric 500 MG CAPS Take 500  mg by mouth 2 (two) times daily.    Yes Historical Provider, MD  vitamin E 400 UNIT capsule Take 400 Units by mouth daily.   Yes Historical Provider, MD  nitroGLYCERIN (NITROSTAT) 0.4 MG SL tablet Place 0.4 mg under the tongue every 5 (five) minutes as needed.  10/16/16   Historical Provider, MD    Current Facility-Administered Medications  Medication Dose Route Frequency Provider Last Rate Last Dose  . 0.9 %  sodium chloride infusion  250 mL Intravenous PRN Leonie Man, MD      . 0.9 %  sodium chloride infusion   Intravenous Continuous Leonie Man, MD 75 mL/hr at 10/20/16 1115    . acetaminophen (TYLENOL) tablet 650 mg  650 mg Oral Q4H PRN Leonie Man, MD      . allopurinol (ZYLOPRIM) tablet 300 mg  300 mg Oral Daily Leonie Man, MD   300 mg at 10/20/16 1216  . [START ON 10/21/2016] aspirin EC tablet 81 mg  81 mg Oral Daily Leonie Man, MD      . enalapril (VASOTEC) tablet 20 mg  20 mg Oral BID Leonie Man, MD   20 mg at 10/20/16 1215  . finasteride (PROSCAR) tablet 5 mg  5 mg Oral Daily Leonie Man, MD   5 mg at 10/20/16 1216  . gemfibrozil (LOPID) tablet 600 mg  600 mg Oral BID AC Leonie Man, MD      . heparin  ADULT infusion 100 units/mL (25000 units/228m sodium chloride 0.45%)  1,300 Units/hr Intravenous Continuous JMinus Breeding MD      . Melatonin TABS 9 mg  9 mg Oral QHS DLeonie Man MD      . metoprolol succinate (TOPROL-XL) 24 hr tablet 25 mg  25 mg Oral Daily DLeonie Man MD   25 mg at 10/20/16 1216  . nitroGLYCERIN (NITROSTAT) SL tablet 0.4 mg  0.4 mg Sublingual Q5 min PRN DLeonie Man MD      . ondansetron ([facility] injection 4 mg  4 mg Intravenous Q6H PRN DLeonie Man MD      . sodium chloride flush (NS) 0.9 % injection 3 mL  3 mL Intravenous Q12H DLeonie Man MD   3 mL at 10/20/16 1217  . sodium chloride flush (NS) 0.9 % injection 3 mL  3 mL Intravenous PRN DLeonie Man MD        Allergies  Allergen Reactions  . Doxycycline Hives  . Penicillins Hives    Has patient had a PCN reaction causing immediate rash, facial/tongue/throat swelling, SOB or lightheadedness with hypotension: No Has patient had a PCN reaction causing severe rash involving mucus membranes or skin necrosis: No Has patient had a PCN reaction that required hospitalization: No Has patient had a PCN reaction occurring within the last 10 years: Yes If all of the above answers are "NO", then may proceed with Cephalosporin use.   . Sulfa Antibiotics Hives      Review of Systems:   General:  normal appetite, normal energy, no weight gain, no weight loss, no fever  Cardiac:  + chest pain with exertion, no chest pain at rest, + SOB with exertion, no resting SOB, no PND, no orthopnea, no palpitations, no arrhythmia, no atrial fibrillation, no LE edema, no dizzy spells, no syncope  Respiratory:  + shortness of breath, no home oxygen, no productive cough, no dry cough, no bronchitis, no wheezing, no hemoptysis, no asthma, no pain  with inspiration or cough, no sleep apnea, no CPAP at night  GI:   no difficulty swallowing, no reflux, no frequent heartburn, no hiatal hernia, no abdominal pain, no  constipation, no diarrhea, no hematochezia, no hematemesis, no melena  GU:   no dysuria,  + frequency, no urinary tract infection, no hematuria, + enlarged prostate, no kidney stones, no kidney disease  Vascular:  no pain suggestive of claudication, no pain in feet, + frequent nocturnal leg cramps, no varicose veins, no DVT, no non-healing foot ulcer  Neuro:   no stroke, no TIA's, no seizures, no headaches, no temporary blindness one eye,  no slurred speech, no peripheral neuropathy, no chronic pain, no instability of gait, no memory/cognitive dysfunction  Musculoskeletal: + arthritis - primarily involving the hips, no joint swelling, no myalgias, no difficulty walking, normal mobility   Skin:   no rash, no itching, no skin infections, no pressure sores or ulcerations  Psych:   no anxiety, no depression, no nervousness, no unusual recent stress  Eyes:   no blurry vision, no floaters, no recent vision changes, + wears glasses or contacts  ENT:   no hearing loss, no loose or painful teeth, no dentures, last saw dentist within 6 months  Hematologic:  no easy bruising, no abnormal bleeding, no clotting disorder, no frequent epistaxis  Endocrine:  + diabetes, does not check CBG's at home     Physical Exam:   BP 122/75 (BP Location: Left Arm)   Pulse 62   Temp 97.1 F (36.2 C) (Axillary)   Resp 15   Ht _0  (1.753 m)   Wt 215 lb (97.5 kg)   SpO2 97%   BMI 31.75 kg/m   General:  Moderately obese,  well-appearing  HEENT:  Unremarkable   Neck:   no JVD, no bruits, no adenopathy   Chest:   clear to auscultation, symmetrical breath sounds, no wheezes, no rhonchi   CV:   RRR, grade III/VI crescendo/decrescendo systolic murmur best RUSB  Abdomen:  soft, non-tender, no masses   Extremities:  warm, well-perfused, pulses palpable, no lower extremity edema  Rectal/GU  Deferred  Neuro:   Grossly non-focal and symmetrical throughout  Skin:   Clean and dry, no rashes, no breakdown  Diagnostic  Tests:  Transthoracic Echocardiography  Patient:    Scott [name], Scott [name] MR #:       [id number] Study Date: 10/19/2016 Gender:     M Age:        70 Height:     175.3 cm Weight:     98.4 kg BSA:        2.22 m^2 Pt. Status: Room:   ATTENDING    Minus Breeding, MD  ORDERING     Minus Breeding, MD  REFERRING    Minus Breeding, MD  REFERRING    Manon Hilding  SONOGRAPHER  Leavy Cella  PERFORMING   [facility], [facility] Na  cc:  ------------------------------------------------------------------- LV EF: 60% -   65%  ------------------------------------------------------------------- Indications:      Aortic stenosis 424.1.  ------------------------------------------------------------------- History:   PMH:   Angina pectoris.  Angina pectoris.  Risk factors:  Hypertension.  ------------------------------------------------------------------- Study Conclusions  - Left ventricle: Wall thickness was increased in a pattern of   severe LVH. Systolic function was normal. The estimated ejection   fraction was in the range of 60% to 65%. Wall motion was normal;   there were no regional wall motion abnormalities. Features are   consistent with a pseudonormal left  ventricular filling pattern,   with concomitant abnormal relaxation and increased filling   pressure (grade 2 diastolic dysfunction). - Aortic valve: Mildly calcified annulus. Functionally bicuspid;   moderately calcified leaflets. There was severe stenosis. There   was trivial regurgitation. Mean gradient (S): 44 mm Hg. Peak   gradient (S): 79 mm Hg. VTI ratio of LVOT to aortic valve: 0.25.   Valve area (VTI): 0.79 cm^2. - Mitral valve: Moderately calcified annulus. Mildly calcified   leaflets . There was mild regurgitation. Valve area by pressure   half-time: 1.65 cm^2. Valve area by continuity equation (using   LVOT flow): 1.44 cm^2. - Left atrium: The atrium was mildly dilated. - Right atrium: Central venous pressure  (est): 3 mm Hg. - Tricuspid valve: There was trivial regurgitation. - Pulmonary arteries: Systolic pressure could not be accurately   estimated. - Pericardium, extracardiac: There was no pericardial effusion.  Impressions:  - Severe LVH with LVEF 60-65%. Grade 2 diastolic dysfunction. Mild   left atrial enlargement. Moderately calcified mitral annulus with   calcified leaflets and mild mitral regurgitation. Aortic valve is   moderately calcified and functionally bicuspid. There is severe   aortic stenosis as outlined above. Trivial tricuspid   regurgitation.  ------------------------------------------------------------------- Study data:  No prior study was available for comparison.  Study status:  Routine.  Procedure:  Transthoracic echocardiography. Image quality was adequate.  Study completion:  There were no complications.          Transthoracic echocardiography.  M-mode, complete 2D, spectral Doppler, and color Doppler.  Birthdate: Patient birthdate: 1946/06/02.  Age:  Patient is 70 yr old.  Sex: Gender: male.    BMI: 32 kg/m^2.  Patient status:  Outpatient. Study date:  Study date: 10/19/2016. Study time: 02:32 PM. Location:  Echo laboratory.  -------------------------------------------------------------------  ------------------------------------------------------------------- Left ventricle:   Wall thickness was increased in a pattern of severe LVH.   Systolic function was normal. The estimated ejection fraction was in the range of 60% to 65%. Wall motion was normal; there were no regional wall motion abnormalities. Features are consistent with a pseudonormal left ventricular filling pattern, with concomitant abnormal relaxation and increased filling pressure (grade 2 diastolic dysfunction).  ------------------------------------------------------------------- Aortic valve:   Mildly calcified annulus. Functionally bicuspid; moderately calcified leaflets.  Doppler:    There was severe stenosis.   There was trivial regurgitation.    VTI ratio of LVOT to aortic valve: 0.25. Valve area (VTI): 0.79 cm^2. Indexed valve area (VTI): 0.36 cm^2/m^2. Peak velocity ratio of LVOT to aortic valve: 0.21. Valve area (Vmax): 0.65 cm^2. Indexed valve area (Vmax): 0.29 cm^2/m^2. Mean velocity ratio of LVOT to aortic valve: 0.22. Valve area (Vmean): 0.68 cm^2. Indexed valve area (Vmean): 0.31 cm^2/m^2.    Mean gradient (S): 44 mm Hg. Peak gradient (S): 79 mm Hg.  ------------------------------------------------------------------- Aorta:  Aortic root: The aortic root was normal in size.  ------------------------------------------------------------------- Mitral valve:  Moderately calcified annulus. Mildly calcified leaflets .  Doppler:  There was mild regurgitation.    Valve area by pressure half-time: 1.65 cm^2. Indexed valve area by pressure half-time: 0.74 cm^2/m^2. Valve area by continuity equation (using LVOT flow): 1.44 cm^2. Indexed valve area by continuity equation (using LVOT flow): 0.65 cm^2/m^2.    Mean gradient (D): 3 mm Hg. Peak gradient (D): 9 mm Hg.  ------------------------------------------------------------------- Left atrium:  The atrium was mildly dilated.  ------------------------------------------------------------------- Right ventricle:  The cavity size was normal. Systolic function was normal.  ------------------------------------------------------------------- Pulmonic valve:    The  valve appears to be grossly normal. Doppler:  There was trivial regurgitation.  ------------------------------------------------------------------- Tricuspid valve:   The valve appears to be grossly normal. Doppler:  There was trivial regurgitation.  ------------------------------------------------------------------- Pulmonary artery:    Systolic pressure could not be  accurately estimated.  ------------------------------------------------------------------- Right atrium:  The atrium was normal in size.  ------------------------------------------------------------------- Pericardium:  There was no pericardial effusion.  ------------------------------------------------------------------- Systemic veins: Inferior vena cava: The vessel was normal in size. The respirophasic diameter changes were in the normal range (>= 50%), consistent with normal central venous pressure.  ------------------------------------------------------------------- Measurements   Left ventricle                            Value          Reference  LV ID, ED, PLAX chordal           (L)     37.1  mm       43 - 52  LV ID, ES, PLAX chordal           (L)     22    mm       23 - 38  LV fx shortening, PLAX chordal            41    %        >=29  LV PW thickness, ED                       18.9  mm       ---------  IVS/LV PW ratio, ED                       0.97           <=1.3  Stroke volume, 2D                         83    ml       ---------  Stroke volume/bsa, 2D                     37    ml/m^2   ---------  LV e&', lateral                            6.92  cm/s     ---------  LV E/e&', lateral                          21.1           ---------  LV e&', medial                             6.22  cm/s     ---------  LV E/e&', medial                           23.47          ---------  LV e&', average                            6.57  cm/s     ---------  LV E/e&', average  22.22          ---------    Ventricular septum                        Value          Reference  IVS thickness, ED                         18.4  mm       ---------    LVOT                                      Value          Reference  LVOT ID, S                                20    mm       ---------  LVOT area                                 3.14  cm^2     ---------  LVOT peak velocity, S                      91.7  cm/s     ---------  LVOT mean velocity, S                     67    cm/s     ---------  LVOT VTI, S                               26.3  cm       ---------  LVOT peak gradient, S                     3     mm Hg    ---------    Aortic valve                              Value          Reference  Aortic valve peak velocity, S             444   cm/s     ---------  Aortic valve mean velocity, S             308   cm/s     ---------  Aortic valve VTI, S                       104   cm       ---------  Aortic mean gradient, S                   44    mm Hg    ---------  Aortic peak gradient, S                   79    mm Hg    ---------  VTI ratio, LVOT/AV  0.25           ---------  Aortic valve area, VTI                    0.79  cm^2     ---------  Aortic valve area/bsa, VTI                0.36  cm^2/m^2 ---------  Velocity ratio, peak, LVOT/AV             0.21           ---------  Aortic valve area, peak velocity          0.65  cm^2     ---------  Aortic valve area/bsa, peak               0.29  cm^2/m^2 ---------  velocity  Velocity ratio, mean, LVOT/AV             0.22           ---------  Aortic valve area, mean velocity          0.68  cm^2     ---------  Aortic valve area/bsa, mean               0.31  cm^2/m^2 ---------  velocity    Aorta                                     Value          Reference  Aortic root ID, ED                        33    mm       ---------    Left atrium                               Value          Reference  LA ID, A-P, ES                            55    mm       ---------  LA ID/bsa, A-P                    (H)     2.48  cm/m^2   <=2.2  LA volume, S                              82.1  ml       ---------  LA volume/bsa, S                          37    ml/m^2   ---------  LA volume, ES, 1-p A4C                    76.8  ml       ---------  LA volume/bsa, ES, 1-p A4C                34.6  ml/m^2   ---------  LA volume, ES,  1-p A2C  83.9  ml       ---------  LA volume/bsa, ES, 1-p A2C                37.8  ml/m^2   ---------    Mitral valve                              Value          Reference  Mitral E-wave peak velocity               146   cm/s     ---------  Mitral A-wave peak velocity               100   cm/s     ---------  Mitral mean velocity, D                   78.3  cm/s     ---------  Mitral deceleration time          (H)     310   ms       150 - 230  Mitral pressure half-time                 133   ms       ---------  Mitral mean gradient, D                   3     mm Hg    ---------  Mitral peak gradient, D                   9     mm Hg    ---------  Mitral E/A ratio, peak                    1.5            ---------  Mitral valve area, PHT, DP                1.65  cm^2     ---------  Mitral valve area/bsa, PHT, DP            0.74  cm^2/m^2 ---------  Mitral valve area, LVOT                   1.44  cm^2     ---------  continuity  Mitral valve area/bsa, LVOT               0.65  cm^2/m^2 ---------  continuity  Mitral annulus VTI, D                     57.5  cm       ---------    Systemic veins                            Value          Reference  Estimated CVP                             3     mm Hg    ---------    Right ventricle                           Value  Reference  TAPSE                                     21.4  mm       ---------  Legend: (L)  and  (H)  mark values outside specified reference range.  ------------------------------------------------------------------- Prepared and Electronically Authenticated by  Rozann Lesches, M.D. 2017-11-27T11:50:08   Right/Left Heart Cath and Coronary Angiography  Conclusion     LV end diastolic pressure is severely elevated.  There is severe aortic valve stenosis: Peak-peak gradient 57 mmHg. Mean gradient 53 mmHg. Estimated Aortic Valve Area 0.52 cm.  Hemodynamic findings consistent with mild pulmonary  hypertension - secondary to elevated PCWP/LVEDP  Severe bifurcation [place] 1,1,1 LAD-D3 lesion (99%pre, 90% post, 80% ostD1):  Mid LAD to Dist LAD lesion, 99 %stenosed @ D3 followed by Dist LAD lesion, 90 %stenosed after D3.  Ost 3rd Diag lesion, 80 %stenosed.  Ost 1st Diag to 1st Diag lesion, 70 %stenosed.  Ost 2nd Diag lesion, 60 %stenosed. Small caliber vessel  Prox RCA lesion, 60 %stenosed. Small caliber nondominant vessel    Patient has severe bifurcation LAD-D3 disease along with some moderate ostial D1 disease in the setting of severe, borderline critical aortic stenosis. After reviewing the images and in light of the significantly elevated LVEDP, I think the safest option for this gentleman is AVR/CABG.  I am very concerned about the severity of the LAD lesion being bifurcation in nature, and would prefer to admit the patient based on his unstable angina presentation. He will be started on IV heparin. CT surgical consulted for hopeful AVR/CABG in the near future  I reviewed the images with Dr. Percival Spanish. Will forward note to PCP.  Plan:  Admit to [facility] post procedure unit with TR band removal per protocol.  Initiate IV heparin  Continue home medications -he is on gemfibrozil, would potentially recommend switching to statin if possible. He is already on Toprol and enalapril.  He has significant Restless Leg Syndrome - continue Requip    Glenetta Hew, M.D., M.S. Interventional Cardiologist   Pager # [phone] Phone # [phone] [address], [place] [address]      Indications   Severe aortic stenosis [I35.0 (ICD-10-CM)]  Unstable angina (HCC) [I20.0 (ICD-10-CM)]  Procedural Details/Technique   Technical Details PCP: Manon Hilding MD CARDIOLOGIST: Minus Breeding, M.D.  SHADI SESSLER is a 70 y.o. male who presents for evaluation of chest pain. He was an add-on this morning. He has no prior cardiac history other than  it turns out a murmur and he thinks he might have had an ultrasound years ago. He started noticing chest pain about 2 months ago. He was noted to have a significant aortic murmur on exam. Dr. Percival Spanish was concerned for unstable angina and possible aortic stenosis. He was first sent for an echocardiogram which suggested severe aortic stenosis with mean gradient of 44 mmHg. The original plan for left heart catheterization was then converted to right and left heart catheterization as possible preop evaluation.  Time Out: Verified patient identification, verified procedure, site/side was marked, verified correct patient position, special equipment/implants available, medications/allergies/relevent history reviewed, required imaging and test results available. Performed.   Access:  RIGHT Radial Artery: 6 Fr sheath -- Seldinger technique using Angiocath Micropuncture Kit * 10 mL radial cocktail IA; 5000 Units IV Heparin Right Brachial/Antecubital Vein: The existing 18-gauge IV was exchanged over a wire  for a 5Fr short sheath  Right Heart Catheterization: 5 Fr Gordy Councilman catheter advanced under fluoroscopy with balloon inflated to the RA, RV, then PCWP-PA for hemodynamic measurement.  Simultaneous FA & PA blood gases checked for SaO2% to calculate FICK CO/CI  Catheter removed completely out of the body with balloon deflated.  Left Heart Catheterization: 5 Fr Catheters advanced or exchanged over a J-wire under direct fluoroscopic guidance into the ascending aorta; TIG 4.0 catheter advanced first.  LV Hemodynamics: AL-1 catheter Left Coronary Artery Cineangiography: TIG 4.0 Catheter  Right Coronary Artery Cineangiography: TIG 4.0 Catheter   Femoral / Brachial Sheath(s) removed in the cardiac Cath Lab with manual pressure for hemostasis.   Radial sheath removed in the Cardiac Catheterization lab with TR Band placed for hemostasis.  TR Band: 10:32 Hours; 11 mL air  MEDICATIONS * 25 g IV fentanyl, 2  mgIV Versed * SQ Lidocaine 70m * Radial Cocktail: 3 mg Verapamil in 10 mL NS * Isovue Contrast: 40 mL * Heparin: 5000 Units  Fluoro Time: 11.5 min. DAP 20284. 395 mGry   Estimated blood loss <50 mL.  During this procedure the patient was administered the following to achieve and maintain moderate conscious sedation: Versed 2 mg, Fentanyl 25 mcg, while the patient's heart rate, blood pressure, and oxygen saturation were continuously monitored. The period of conscious sedation was 45 minutes, of which I was present face-to-face 100% of this time.    Complications   Complications documented before study signed (10/20/2016 1:20 PM EST)    No complications were associated with this study.  Documented by DLeonie Man MD - 10/20/2016 1:11 PM EST    Coronary Findings   Dominance: Left  Left Main  Vessel is large.  Left Anterior Descending  Mid LAD to Dist LAD lesion, 99% stenosed. The lesion is tubular, eccentric and irregular.  Dist LAD lesion, 90% stenosed. The lesion is tubular, eccentric and irregular.  First Diagonal Branch  Vessel is moderate in size.  Ost 1st Diag to 1st Diag lesion, 70% stenosed. The lesion is discrete.  First Septal Branch  Vessel is small in size.  Second Diagonal Branch  Vessel is small in size.  Ost 2nd Diag lesion, 60% stenosed.  Second Septal Branch  Vessel is small in size.  Third Diagonal [employer]3rd Diag lesion, 80% stenosed. The lesion is located at the major branch and discrete.  Lateral Third Diagonal Branch  Vessel is moderate in size.  Third Septal Branch  Vessel is small in size.  Left Circumflex  Vessel is large. Vessel is angiographically normal.  First Obtuse Marginal Branch  Vessel is large in size. Vessel is angiographically normal.  Lateral First Obtuse Marginal Branch  Vessel is small in size.  Fourth Obtuse Marginal Branch  Vessel is moderate in size. Vessel is angiographically normal.  Left Posterior Descending  Artery  Vessel is large in size. Vessel is angiographically normal.  First Left Posterolateral Branch  Vessel is moderate in size. Vessel is angiographically normal.  Second Left Posterolateral Branch  Vessel is moderate in size. Vessel is angiographically normal.  Right Coronary Artery  Prox RCA lesion, 60% stenosed. The lesion is irregular.  Right Heart   Right Heart Pressures Hemodynamic findings consistent with mild pulmonary hypertension. Elevated LV EDP consistent with volume overload. * Central AoP/MAP: 127/66/90 mmHg mmHg * LVP/EDP: 184/9/38 mmHg: Severe diastolic dysfunction from aortic stenosis * PCWP: 30/30 mmHg  * PAP/mean: 45/18/29 mmHg * Cardiac output/index by Fick: 3.94,  1.5    Right Atrium * RAP/mean: 10/7/6 mmHg    Right Ventricle * RVP/EDP: 49/33/9 mmHg    Wall Motion   No LV Gram        Left Heart   Left Ventricle Normal by Echocardiogram LV end diastolic pressure is severely elevated. EF 60 - 65% by echo CO/CI by FICK: 3.94 / 1.85 - Moderately reduced    Aortic Valve There is severe aortic valve stenosis. The aortic valve is calcified. Peak-peak gradient 57 mmHg. Mean gradient 53 mmHg.  Estimated Aortic Valve Area 0.52 cm.    Coronary Diagrams   Diagnostic Diagram     Implants     No implant documentation for this case.  PACS Images   Show images for Cardiac catheterization   Link to Procedure Log   Procedure Log    Hemo Data   Flowsheet Row Most Recent Value  Fick Cardiac Output 3.94 L/min  Fick Cardiac Output Index 1.85 (L/min)/BSA  Aortic Mean Gradient 53.7 mmHg  Aortic Peak Gradient 57 mmHg  Aortic Valve Area 0.52  Aortic Value Area Index 0.24 cm2/BSA  RA A Wave 10 mmHg  RA V Wave 7 mmHg  RA Mean 6 mmHg  RV Systolic Pressure 49 mmHg  RV Diastolic Pressure 3 mmHg  RV EDP 9 mmHg  PA Systolic Pressure 45 mmHg  PA Diastolic Pressure 18 mmHg  PA Mean 29 mmHg  PW A Wave 30 mmHg  PW V Wave 30 mmHg  PW Mean 21 mmHg  AO Systolic  Pressure 211 mmHg  AO Diastolic Pressure 64 mmHg  AO Mean 86 mmHg  LV Systolic Pressure 155 mmHg  LV Diastolic Pressure 10 mmHg  LV EDP 37 mmHg  Arterial Occlusion Pressure Extended Systolic Pressure 208 mmHg  Arterial Occlusion Pressure Extended Diastolic Pressure 66 mmHg  Arterial Occlusion Pressure Extended Mean Pressure 90 mmHg  Left Ventricular Apex Extended Systolic Pressure 022 mmHg  Left Ventricular Apex Extended Diastolic Pressure 9 mmHg  Left Ventricular Apex Extended EDP Pressure 38 mmHg  QP/QS 1  TPVR Index 15.68 HRUI  TSVR Index 46.48 HRUI  PVR SVR Ratio 0.1  TPVR/TSVR Ratio 0.34      Impression:  I have personally reviewed the patient's transthoracic echocardiogram and diagnostic cardiac catheterization. He has a functionally bicuspid aortic valve with severe aortic stenosis and multivessel coronary artery disease with critical high-grade stenosis of the left anterior descending coronary artery.  He presents with a 2 month history of progressive symptoms of exertional chest pain consistent with unstable angina pectoris. Left ventricular systolic function remains normal. I agree he would best be treated with aortic valve replacement and coronary artery bypass grafting.  Plan:  The patient and his wife were counseled at length regarding treatment alternatives for management of severe aortic stenosis and coronary artery disease.  Options were discussed at length including a comparison between conventional surgical aortic valve replacement with coronary artery bypass grafting versus PCI and stenting with transcatheter aortic valve replacement versus continued medical therapy without intervention.  Discussion was held comparing the relative risks of mechanical valve replacement with need for lifelong anticoagulation versus use of a bioprosthetic tissue valve and the associated potential for late structural valve deterioration and failure.  This discussion was placed in the context  of the patient's particular circumstances, and as a result the patient specifically requests that their valve be replaced using a bioprosthetic tissue valve.    The patient understands and accepts all potential associated risks of surgery including  but not limited to risk of death, stroke, myocardial infarction, congestive heart failure, respiratory failure, renal failure, pneumonia, bleeding requiring blood transfusion and or reexploration, arrhythmia, heart block or bradycardia requiring permanent pacemaker, aortic dissection or other major vascular complication, pleural effusions or other delayed complications related to continued congestive heart failure, late recurrence of symptomatic ischemic heart disease, and other late complications related to valve replacement including structural valve deterioration and failure, thrombosis, endocarditis, or paravalvular leak.  We tentatively plan to proceed with surgery on Thursday, 10/22/2016.  Because of the presence of a bicuspid aortic valve and the relatively generous size of the aortic root noted on transthoracic echocardiogram, we will obtain CT angiogram of the chest prior to surgery.   I spent in excess of 120 minutes during the conduct of this hospital consultation and >50% of this time involved direct face-to-face encounter for counseling and/or coordination of the patient's care.    Valentina Gu. Roxy Manns, MD 10/20/2016 2:24 PM

## 2016-10-20 NOTE — Progress Notes (Signed)
Inpatient Diabetes Program Recommendations  AACE/ADA: New Consensus Statement on Inpatient Glycemic Control (2015)  Target Ranges:  Prepandial:   less than 140 mg/dL      Peak postprandial:   less than 180 mg/dL (1-2 hours)      Critically ill patients:  140 - 180 mg/dL   Lab Results  Component Value Date   GLUCAP 147 (H) 10/20/2016    Review of Glycemic Control  Diabetes history: DM2 Outpatient Diabetes medications: Metformin 500 mg qd Current orders for Inpatient glycemic control: None  Inpatient Diabetes Program Recommendations:  Noted patient scheduled for surgery 10/22/16.Received consult and spoke with patient @ bedside about A1C results with them and explained what an A1C is, basic pathophysiology of DM Type 2, basic home care, basic diabetes diet nutrition principles, importance of checking CBGs and maintaining good CBG control to prevent long-term and short-term complications. Reviewed signs and symptoms of hyperglycemia and hypoglycemia and how to treat hypoglycemia at home. Also reviewed blood sugar goals at home.  RNs to provide ongoing basic DM education at bedside with this patient.  Patient and wife have respond appropriately to questions asked concerning diabetes and patient desires to closely monitor weight and decrease weight as appropriate. Will follow.  Thank you, Nani Gasser. Srah Ake, RN, MSN, CDE Inpatient Glycemic Control Team Team Pager [phone] (8am-5pm) 10/20/2016 3:43 PM

## 2016-10-20 NOTE — Care Management Note (Signed)
Case Management Note  Patient Details  Name: Scott [name] MRN: [id number] Date of Birth: August 13, 1946  Subjective/Objective:   S/p heart cath, pta indep, lives with wife.  NCM will cont to follow for dc needs.                  Action/Plan:   Expected Discharge Date:                  Expected Discharge Plan:  Home/Self Care  In-House Referral:     Discharge planning Services  CM Consult  Post Acute Care Choice:    Choice offered to:     DME Arranged:    DME Agency:     HH Arranged:    HH Agency:     Status of Service:  Completed, signed off  If discussed at [employer] of Stay Meetings, dates discussed:    Additional Comments:  Zenon Mayo, RN 10/20/2016, 11:42 AM

## 2016-10-20 NOTE — Progress Notes (Signed)
TR BAND REMOVAL  LOCATION:    right radial  DEFLATED PER PROTOCOL:    Yes.    TIME BAND OFF / DRESSING APPLIED:    1300   SITE UPON ARRIVAL:    Level 0  SITE AFTER BAND REMOVAL:    Level 0  CIRCULATION SENSATION AND MOVEMENT:    Within Normal Limits   Yes.    COMMENTS:   Tolerated procedure well

## 2016-10-21 ENCOUNTER — Inpatient Hospital Stay (HOSPITAL_COMMUNITY): Payer: Medicare Other

## 2016-10-21 ENCOUNTER — Encounter (HOSPITAL_COMMUNITY): Payer: Self-pay | Admitting: Cardiology

## 2016-10-21 DIAGNOSIS — I350 Nonrheumatic aortic (valve) stenosis: Secondary | ICD-10-CM

## 2016-10-21 DIAGNOSIS — Z01810 Encounter for preprocedural cardiovascular examination: Secondary | ICD-10-CM

## 2016-10-21 DIAGNOSIS — I200 Unstable angina: Secondary | ICD-10-CM

## 2016-10-21 DIAGNOSIS — I7121 Aneurysm of the ascending aorta, without rupture: Secondary | ICD-10-CM

## 2016-10-21 DIAGNOSIS — I712 Thoracic aortic aneurysm, without rupture: Secondary | ICD-10-CM

## 2016-10-21 DIAGNOSIS — I2510 Atherosclerotic heart disease of native coronary artery without angina pectoris: Secondary | ICD-10-CM

## 2016-10-21 HISTORY — DX: Aneurysm of the ascending aorta, without rupture: I71.21

## 2016-10-21 HISTORY — DX: Thoracic aortic aneurysm, without rupture: I71.2

## 2016-10-21 LAB — PULMONARY FUNCTION TEST
DL/VA % PRED: 80 %
DL/VA: 3.56 ml/min/mmHg/L
DLCO COR: 20.73 ml/min/mmHg
DLCO UNC % PRED: 71 %
DLCO cor % pred: 73 %
DLCO unc: 20.31 ml/min/mmHg
FEF 25-75 PRE: 2.78 L/s
FEF 25-75 Post: 2.85 L/sec
FEF2575-%CHANGE-POST: 2 %
FEF2575-%PRED-POST: 130 %
FEF2575-%Pred-Pre: 126 %
FEV1-%Change-Post: 1 %
FEV1-%PRED-PRE: 100 %
FEV1-%Pred-Post: 101 %
FEV1-PRE: 2.87 L
FEV1-Post: 2.91 L
FEV1FVC-%CHANGE-POST: 5 %
FEV1FVC-%Pred-Pre: 106 %
FEV6-%Change-Post: -5 %
FEV6-%Pred-Post: 93 %
FEV6-%Pred-Pre: 98 %
FEV6-POST: 3.43 L
FEV6-Pre: 3.64 L
FEV6FVC-%Change-Post: 0 %
FEV6FVC-%PRED-PRE: 106 %
FEV6FVC-%Pred-Post: 106 %
FVC-%Change-Post: -3 %
FVC-%PRED-POST: 89 %
FVC-%PRED-PRE: 93 %
FVC-POST: 3.51 L
FVC-Pre: 3.65 L
POST FEV1/FVC RATIO: 83 %
POST FEV6/FVC RATIO: 100 %
Pre FEV1/FVC ratio: 79 %
Pre FEV6/FVC Ratio: 100 %
RV % PRED: 133 %
RV: 3.06 L
TLC % pred: 109 %
TLC: 7.04 L

## 2016-10-21 LAB — BLOOD GAS, ARTERIAL
Acid-base deficit: 1.3 mmol/L (ref 0.0–2.0)
Bicarbonate: 22.4 mmol/L (ref 20.0–28.0)
DRAWN BY: 225631
FIO2: 21
O2 Saturation: 95.9 %
PCO2 ART: 33.9 mmHg (ref 32.0–48.0)
PH ART: 7.434 (ref 7.350–7.450)
Patient temperature: 98.6
pO2, Arterial: 84 mmHg (ref 83.0–108.0)

## 2016-10-21 LAB — SURGICAL PCR SCREEN
MRSA, PCR: NEGATIVE
Staphylococcus aureus: NEGATIVE

## 2016-10-21 LAB — COMPREHENSIVE METABOLIC PANEL
ALBUMIN: 3.6 g/dL (ref 3.5–5.0)
ALK PHOS: 62 U/L (ref 38–126)
ALT: 22 U/L (ref 17–63)
ANION GAP: 8 (ref 5–15)
AST: 28 U/L (ref 15–41)
BUN: 14 mg/dL (ref 6–20)
CALCIUM: 9 mg/dL (ref 8.9–10.3)
CO2: 22 mmol/L (ref 22–32)
Chloride: 108 mmol/L (ref 101–111)
Creatinine, Ser: 0.9 mg/dL (ref 0.61–1.24)
GFR calc Af Amer: >60 mL/min (ref >60)
GFR calc non Af Amer: >60 mL/min (ref >60)
GLUCOSE: 131 mg/dL — AB (ref 65–99)
Potassium: 4.5 mmol/L (ref 3.5–5.1)
SODIUM: 138 mmol/L (ref 135–145)
Total Bilirubin: 0.6 mg/dL (ref 0.3–1.2)
Total Protein: 5.6 g/dL — ABNORMAL LOW (ref 6.5–8.1)

## 2016-10-21 LAB — GLUCOSE, CAPILLARY
GLUCOSE-CAPILLARY: 127 mg/dL — AB (ref 65–99)
GLUCOSE-CAPILLARY: 128 mg/dL — AB (ref 65–99)
GLUCOSE-CAPILLARY: 102 mg/dL — AB (ref 65–99)
GLUCOSE-CAPILLARY: 155 mg/dL — AB (ref 65–99)
Glucose-Capillary: 119 mg/dL — ABNORMAL HIGH (ref 65–99)

## 2016-10-21 LAB — CBC
HEMATOCRIT: 40.5 % (ref 39.0–52.0)
HEMOGLOBIN: 13.9 g/dL (ref 13.0–17.0)
MCH: 29.6 pg (ref 26.0–34.0)
MCHC: 34.3 g/dL (ref 30.0–36.0)
MCV: 86.2 fL (ref 78.0–100.0)
Platelets: 151 10*3/uL (ref 150–400)
RBC: 4.7 MIL/uL (ref 4.22–5.81)
RDW: 13.7 % (ref 11.5–15.5)
WBC: 5.4 10*3/uL (ref 4.0–10.5)

## 2016-10-21 LAB — ABO/RH: ABO/RH(D): O POS

## 2016-10-21 LAB — VAS US DOPPLER PRE CABG
LCCAPDIAS: 19 cm/s
LEFT ECA DIAS: -13 cm/s
LEFT VERTEBRAL DIAS: -16 cm/s
LICAPDIAS: -20 cm/s
Left CCA dist dias: -19 cm/s
Left CCA dist sys: -73 cm/s
Left CCA prox sys: 72 cm/s
Left ICA dist dias: -21 cm/s
Left ICA dist sys: -52 cm/s
Left ICA prox sys: -60 cm/s
RCCADSYS: -61 cm/s
RCCAPDIAS: -5 cm/s
RCCAPSYS: -144 cm/s
RIGHT ECA DIAS: -9 cm/s
RIGHT VERTEBRAL DIAS: -5 cm/s

## 2016-10-21 LAB — LIPID PANEL
CHOL/HDL RATIO: 4.8 ratio
Cholesterol: 134 mg/dL (ref 0–200)
HDL: 28 mg/dL — ABNORMAL LOW (ref >40)
LDL CALC: 65 mg/dL (ref 0–99)
TRIGLYCERIDES: 203 mg/dL — AB (ref <150)
VLDL: 41 mg/dL — AB (ref 0–40)

## 2016-10-21 LAB — PREALBUMIN: Prealbumin: 19.7 mg/dL (ref 18–38)

## 2016-10-21 LAB — HEPARIN LEVEL (UNFRACTIONATED): HEPARIN UNFRACTIONATED: 0.29 [IU]/mL — AB (ref 0.30–0.70)

## 2016-10-21 MED ORDER — VANCOMYCIN HCL 1000 MG IV SOLR
INTRAVENOUS | Status: AC
  Administered 2016-10-22: 08:00:00 1000 mL
  Filled 2016-10-21: qty 1000

## 2016-10-21 MED ORDER — DOPAMINE-DEXTROSE 3.2-5 MG/ML-% IV SOLN
0.0000 ug/kg/min | INTRAVENOUS | Status: DC
  Filled 2016-10-21: qty 250

## 2016-10-21 MED ORDER — SODIUM CHLORIDE 0.9 % IV SOLN
INTRAVENOUS | Status: AC
  Administered 2016-10-22: 1.4 [IU]/h via INTRAVENOUS
  Filled 2016-10-21: qty 2.5

## 2016-10-21 MED ORDER — TRANEXAMIC ACID 1000 MG/10ML IV SOLN
1.5000 mg/kg/h | INTRAVENOUS | Status: AC
  Administered 2016-10-22: 15:00:00 via INTRAVENOUS
  Administered 2016-10-22: 1.5 mg/kg/h via INTRAVENOUS
  Filled 2016-10-21: qty 25

## 2016-10-21 MED ORDER — CHLORHEXIDINE GLUCONATE 4 % EX LIQD
60.0000 mL | Freq: Once | CUTANEOUS | Status: AC
  Administered 2016-10-21: 4 via TOPICAL
  Filled 2016-10-21: qty 60

## 2016-10-21 MED ORDER — ALBUTEROL SULFATE (2.5 MG/3ML) 0.083% IN NEBU
2.5000 mg | INHALATION_SOLUTION | Freq: Once | RESPIRATORY_TRACT | Status: AC
  Administered 2016-10-21: 08:00:00 2.5 mg via RESPIRATORY_TRACT

## 2016-10-21 MED ORDER — SODIUM CHLORIDE 0.9 % IV SOLN
INTRAVENOUS | Status: DC
  Filled 2016-10-21: qty 30

## 2016-10-21 MED ORDER — POTASSIUM CHLORIDE 2 MEQ/ML IV SOLN
80.0000 meq | INTRAVENOUS | Status: DC
  Filled 2016-10-21: qty 40

## 2016-10-21 MED ORDER — MAGNESIUM SULFATE 50 % IJ SOLN
40.0000 meq | INTRAMUSCULAR | Status: DC
  Filled 2016-10-21: qty 10

## 2016-10-21 MED ORDER — BISACODYL 5 MG PO TBEC
5.0000 mg | DELAYED_RELEASE_TABLET | Freq: Once | ORAL | Status: AC
  Administered 2016-10-21: 20:00:00 5 mg via ORAL
  Filled 2016-10-21: qty 1

## 2016-10-21 MED ORDER — PAPAVERINE HCL 30 MG/ML IJ SOLN
INTRAMUSCULAR | Status: AC
  Administered 2016-10-22: 07:00:00 500 mL
  Filled 2016-10-21: qty 2.5

## 2016-10-21 MED ORDER — TEMAZEPAM 15 MG PO CAPS: 15.0000 mg | ORAL_CAPSULE | Freq: Once | ORAL | Status: DC | PRN

## 2016-10-21 MED ORDER — PHENYLEPHRINE HCL 10 MG/ML IJ SOLN
30.0000 ug/min | INTRAMUSCULAR | Status: AC
  Administered 2016-10-22: 10 ug/min via INTRAVENOUS
  Filled 2016-10-21: qty 2

## 2016-10-21 MED ORDER — CHLORHEXIDINE GLUCONATE 4 % EX LIQD
60.0000 mL | Freq: Once | CUTANEOUS | Status: AC
  Administered 2016-10-22: 4 via TOPICAL
  Filled 2016-10-21: qty 60

## 2016-10-21 MED ORDER — EPINEPHRINE PF 1 MG/ML IJ SOLN
0.0000 ug/min | INTRAVENOUS | Status: DC
  Filled 2016-10-21: qty 4

## 2016-10-21 MED ORDER — NITROGLYCERIN IN D5W 200-5 MCG/ML-% IV SOLN
2.0000 ug/min | INTRAVENOUS | Status: AC
  Administered 2016-10-22: 17 ug/min via INTRAVENOUS
  Filled 2016-10-21: qty 250

## 2016-10-21 MED ORDER — CHLORHEXIDINE GLUCONATE 0.12 % MT SOLN
15.0000 mL | Freq: Once | OROMUCOSAL | Status: AC
  Administered 2016-10-22: 15 mL via OROMUCOSAL
  Filled 2016-10-21: qty 15

## 2016-10-21 MED ORDER — LEVOFLOXACIN IN D5W 500 MG/100ML IV SOLN
500.0000 mg | INTRAVENOUS | Status: AC
  Administered 2016-10-22 (×2): 500 mg via INTRAVENOUS
  Filled 2016-10-21 (×2): qty 100

## 2016-10-21 MED ORDER — METOPROLOL TARTRATE 12.5 MG HALF TABLET
12.5000 mg | ORAL_TABLET | Freq: Once | ORAL | Status: AC
  Administered 2016-10-22: 06:00:00 12.5 mg via ORAL
  Filled 2016-10-21: qty 1

## 2016-10-21 MED ORDER — DEXMEDETOMIDINE HCL IN NACL 400 MCG/100ML IV SOLN
0.1000 ug/kg/h | INTRAVENOUS | Status: AC
  Administered 2016-10-22: .3 ug/kg/h via INTRAVENOUS
  Administered 2016-10-22: 15:00:00 via INTRAVENOUS
  Filled 2016-10-21: qty 100

## 2016-10-21 MED ORDER — TRANEXAMIC ACID (OHS) PUMP PRIME SOLUTION
2.0000 mg/kg | INTRAVENOUS | Status: DC
  Filled 2016-10-21: qty 1.96

## 2016-10-21 MED ORDER — VANCOMYCIN HCL 10 G IV SOLR
1250.0000 mg | INTRAVENOUS | Status: AC
  Administered 2016-10-22 (×2): 1250 mg via INTRAVENOUS
  Filled 2016-10-21: qty 1250

## 2016-10-21 MED ORDER — TRANEXAMIC ACID (OHS) BOLUS VIA INFUSION
15.0000 mg/kg | INTRAVENOUS | Status: AC
  Administered 2016-10-22: 1470 mg via INTRAVENOUS
  Filled 2016-10-21: qty 1470

## 2016-10-21 MED ORDER — IOPAMIDOL (ISOVUE-370) INJECTION 76%
INTRAVENOUS | Status: AC
  Administered 2016-10-21: 100 mL
  Filled 2016-10-21: qty 100

## 2016-10-21 NOTE — Progress Notes (Signed)
CARDIAC REHAB PHASE I   PRE:  Rate/Rhythm: 53 SB at rest, 80 SR up in room    BP: sitting 146/66    SaO2:   MODE:  Ambulation: 1000 ft   POST:  Rate/Rhythm: 95 SR    BP: sitting 174/79     SaO2:   Tolerated well, no c/o. Pre-op education done. Pt is motivated, likes to walk. Doing 1750 mL on IS. Already watched preop video. I gave him [facility] booklet and careguide to read.  [place], ACSM 10/21/2016 9:43 AM

## 2016-10-21 NOTE — Progress Notes (Signed)
Patient Name: Scott [name] Date of Encounter: 10/21/2016  Primary Cardiologist: Dr. [facility] Problem List     Principal Problem:   Unstable angina [facility]) Active Problems:   Severe aortic stenosis by prior echocardiography   Severe aortic stenosis   Coronary artery disease involving native coronary artery of native heart with unstable angina pectoris (HCC)   Bicuspid aortic valve     Subjective   Feeling well this morning. No complaints.   Inpatient Medications    Scheduled Meds: . allopurinol  300 mg Oral Daily  . aspirin EC  81 mg Oral Daily  . enalapril  20 mg Oral BID  . finasteride  5 mg Oral Daily  . gemfibrozil  600 mg Oral BID AC  . Melatonin  9 mg Oral QHS  . metoprolol succinate  25 mg Oral Daily  . rOPINIRole  2 mg Oral q12n4p  . sodium chloride flush  3 mL Intravenous Q12H   Continuous Infusions: . heparin 1,500 Units/hr (10/21/16 0400)   PRN Meds: sodium chloride, acetaminophen, nitroGLYCERIN, ondansetron (ZOFRAN) IV, sodium chloride flush   Vital Signs    Vitals:   10/20/16 1455 10/20/16 2000 10/20/16 2019 10/21/16 0557  BP: 127/72  125/82 130/80  Pulse: (!) 58 68 69 65  Resp: 17 (!) 28 (!) 25 (!) 21  Temp: 97 F (36.1 C)  97.9 F (36.6 C) 97.6 F (36.4 C)  TempSrc: Axillary  Oral Oral  SpO2: 100% 98% 99% 97%  Weight:    216 lb 0.8 oz (98 kg)  Height:        Intake/Output Summary (Last 24 hours) at 10/21/16 0700 Last data filed at 10/21/16 0400  Gross per 24 hour  Intake            364.5 ml  Output             2060 ml  Net          -1695.5 ml   Filed Weights   10/20/16 0754 10/21/16 0557  Weight: 215 lb (97.5 kg) 216 lb 0.8 oz (98 kg)    Physical Exam    GEN: Well nourished, well developed, in no acute distress.  HEENT: Grossly normal.  Neck: Supple, no JVD, carotid bruits, or masses. Cardiac: RRR, 3/6 systolic murmur , rubs, or gallops. No clubbing, cyanosis, edema.  Radials/DP/PT 2+ and equal bilaterally.    Respiratory:  Respirations regular and unlabored, clear to auscultation bilaterally. GI: Soft, nontender, nondistended, BS + x 4. MS: no deformity or atrophy. Skin: warm and dry, no rash. Right radial site stable without bruising or hematoma.  Neuro:  Strength and sensation are intact. Psych: AAOx3.  Normal affect.  Labs    CBC  Recent Labs  10/19/16 1123 10/21/16 0217  WBC 4.7 5.4  HGB 15.1 13.9  HCT 43.1 40.5  MCV 85.9 86.2  PLT 153 [id number]   Basic Metabolic Panel  Recent Labs  10/19/16 1123 10/21/16 0217  NA 140 138  K 4.8 4.5  CL 105 108  CO2 25 22  GLUCOSE 120* 131*  BUN 14 14  CREATININE 0.87 0.90  CALCIUM 9.8 9.0   Liver Function Tests  Recent Labs  10/21/16 0217  AST 28  ALT 22  ALKPHOS 62  BILITOT 0.6  PROT 5.6*  ALBUMIN 3.6   No results for input(s): LIPASE, AMYLASE in the last 72 hours. Cardiac Enzymes No results for input(s): CKTOTAL, CKMB, CKMBINDEX, TROPONINI in the last 72 hours. BNP  Invalid input(s): POCBNP D-Dimer No results for input(s): DDIMER in the last 72 hours. Hemoglobin A1C No results for input(s): HGBA1C in the last 72 hours. Fasting Lipid Panel  Recent Labs  10/21/16 0217  CHOL 134  HDL 28*  LDLCALC 65  TRIG 203*  CHOLHDL 4.8   Thyroid Function Tests  Recent Labs  10/19/16 1123  TSH 1.93    Telemetry    SR -Personally Reviewed  ECG    SR with 1st degree AVB - Personally Reviewed  Radiology    No results found.  Cardiac Studies   LHC: 10/20/16  Conclusion     LV end diastolic pressure is severely elevated.  There is severe aortic valve stenosis: Peak-peak gradient 57 mmHg. Mean gradient 53 mmHg. Estimated Aortic Valve Area 0.52 cm.  Hemodynamic findings consistent with mild pulmonary hypertension - secondary to elevated PCWP/LVEDP  Severe bifurcation [place] 1,1,1 LAD-D3 lesion (99%pre, 90% post, 80% ostD1):  Mid LAD to Dist LAD lesion, 99 %stenosed @ D3 followed by Dist LAD lesion, 90  %stenosed after D3.  Ost 3rd Diag lesion, 80 %stenosed.  Ost 1st Diag to 1st Diag lesion, 70 %stenosed.  Ost 2nd Diag lesion, 60 %stenosed. Small caliber vessel  Prox RCA lesion, 60 %stenosed. Small caliber nondominant vessel    Patient has severe bifurcation LAD-D3 disease along with some moderate ostial D1 disease in the setting of severe, borderline critical aortic stenosis. After reviewing the images and in light of the significantly elevated LVEDP, I think the safest option for this gentleman is AVR/CABG.  I am very concerned about the severity of the LAD lesion being bifurcation in nature, and would prefer to admit the patient based on his unstable angina presentation. He will be started on IV heparin. CT surgical consulted for hopeful AVR/CABG in the near future  I reviewed the images with Dr. Percival Spanish. Will forward note to PCP.  Plan:  Admit to [facility] post procedure unit with TR band removal per protocol.  Initiate IV heparin  Continue home medications -he is on gemfibrozil, would potentially recommend switching to statin if possible. He is already on Toprol and enalapril.  He has significant Restless Leg Syndrome - continue Requip   Diagnostic Diagram        Patient Profile     70 yo male with PMH of DM, HLD and tobacco use who presented with unstable angina to the office. He was referred by Dr. Percival Spanish for [facility], with Dr. Ellyn Hack.   Assessment & Plan    1. Unstable Angina: Had LHC yesterday showing severe bifurcation LAD-D3 disease with moderate ostial D1 disease with noted critical AS. He was referred to CVTS for consideration of CABG/AVR. Seen by Dr. Roxy Manns who agreed with plans. Planned for CT chest today, and tentatively placed for surgery tomorrow. Labs stable post cath.   -- Remains on IV heparin.  2. NIDDM: On metformin at home. Hgb A1c pending.   Signed, Reino Bellis, NP  10/21/2016, 7:00 AM   I have examined the patient and reviewed assessment  and plan and discussed with patient.  Agree with above as stated.  He is Prepared for surgery tomorrow.  Normal EF.  Feeling well.  Continue heparin until OR.   Larae Grooms

## 2016-10-21 NOTE — Progress Notes (Signed)
[address]       [place] [address]             [phone]     CARDIOTHORACIC SURGERY PROGRESS NOTE  1 Day Post-Op  S/P Procedure(s) (LRB): Right/Left Heart Cath and Coronary Angiography (N/A)  Subjective: No complaints.  No chest pain  Objective: Vital signs in last 24 hours: Temp:  [97.6 F (36.4 C)-97.9 F (36.6 C)] 97.7 F (36.5 C) (11/29 0849) Pulse Rate:  [65-69] 66 (11/29 0849) Cardiac Rhythm: Heart block (11/29 0800) Resp:  [21-28] 21 (11/29 0849) BP: (125-146)/(66-82) 146/66 (11/29 0849) SpO2:  [97 %-99 %] 97 % (11/29 0849) Weight:  [216 lb 0.8 oz (98 kg)] 216 lb 0.8 oz (98 kg) (11/29 0557)  Physical Exam:  Rhythm:   sinus  Breath sounds: clear  Heart sounds:  RRR w/ systolic murmur  Incisions:  n/a  Abdomen:  soft  Extremities:  warm   Intake/Output from previous day: 11/28 0701 - 11/29 0700 In: 409.5 [P.O.:240; I.V.:169.5] Out: 2060 [Urine:2060] Intake/Output this shift: Total I/O In: 245 [P.O.:120; I.V.:125] Out: -   Lab Results:  Recent Labs  10/19/16 1123 10/21/16 0217  WBC 4.7 5.4  HGB 15.1 13.9  HCT 43.1 40.5  PLT 153 151   BMET:  Recent Labs  10/19/16 1123 10/21/16 0217  NA 140 138  K 4.8 4.5  CL 105 108  CO2 25 22  GLUCOSE 120* 131*  BUN 14 14  CREATININE 0.87 0.90  CALCIUM 9.8 9.0    CBG (last 3)   Recent Labs  10/20/16 2140 10/21/16 0600 10/21/16 1212  GLUCAP 151* 128* 102*   PT/INR:   Recent Labs  10/19/16 1123  LABPROT 11.2  INR 1.1    CT ANGIOGRAPHY CHEST WITH CONTRAST  TECHNIQUE: Multidetector CT imaging of the chest was performed using the standard protocol during bolus administration of intravenous contrast. Multiplanar CT image reconstructions and MIPs were obtained to evaluate the vascular anatomy.  CONTRAST:  100 mL of Isovue 370 intravenously.  COMPARISON:  None.  FINDINGS: Cardiovascular: Atherosclerosis of thoracic aorta is noted without dissection. 4.2 cm  ascending thoracic aortic aneurysm is noted. Transverse aortic arch measures 2.9 cm. Descending thoracic aorta measures 2.4 cm. Pulmonary vessels are unremarkable. Great vessels are widely patent without stenosis. Coronary artery calcifications are noted. Aortic valve calcifications are noted.  Mediastinum/Nodes: No enlarged mediastinal, hilar, or axillary lymph nodes. Thyroid gland, trachea, and esophagus demonstrate no significant findings.  Lungs/Pleura: Lungs are clear. No pleural effusion or pneumothorax.  Upper Abdomen: Solitary gallstone is noted.  Musculoskeletal: No chest wall abnormality. No acute or significant osseous findings.  Review of the MIP images confirms the above findings.  IMPRESSION: Atherosclerosis of thoracic aorta without dissection.  Coronary artery calcifications are noted suggesting coronary artery disease.  Aortic valve calcifications are noted.  4.2 cm ascending thoracic aortic aneurysm. Recommend annual imaging followup by CTA or MRA. This recommendation follows 2010 ACCF/AHA/AATS/ACR/ASA/SCA/SCAI/SIR/STS/SVM Guidelines for the Diagnosis and Management of Patients with Thoracic Aortic Disease. Circulation. 2010; [id number].  Solitary gallstone is noted.   Electronically Signed   By: Marijo Conception, M.D.   On: 10/21/2016 13:41  Assessment/Plan: S/P Procedure(s) (LRB): Right/Left Heart Cath and Coronary Angiography (N/A)  I have personally reviewed the patient's CTA chest and discussed results w/ patient.  Will plan resection and grafting of ascending thoracic aortic aneurysm at the time of surgery, likely supracoronary straight graft.  I have again  reviewed the indications, risks and potential benefits of surgery with the patient.  All questions answered.  I spent in excess of 15 minutes during the conduct of this hospital encounter and >50% of this time involved direct face-to-face encounter with the patient for counseling  and/or coordination of their care.    Rexene Alberts, MD 10/21/2016 4:10 PM

## 2016-10-21 NOTE — Progress Notes (Signed)
Pre-op Cardiac Surgery  Carotid Findings:  Bilateral:  1-39% ICA stenosis.  Vertebral artery flow is antegrade.     Upper Extremity Right Left  Brachial Pressures 131 Triphasic 117 Triphasic  Radial Waveforms Triphasic Triphasic  Ulnar Waveforms Triphasic Triphasic  Palmar Arch (Allen's Test) Normal Normal   Findings:  Doppler waveforms remained normal with both radial and ulnar compressions.    Lower  Extremity Right Left  Dorsalis Pedis 133 Triphasic 166 Triphasic  Posterior Tibial >255 Triphasic 177 Triphasic  Ankle/Brachial Indices N/A 1.03    Findings:  Right ABI could not be ascertained. Non compressible vessel . Probably due to calcification. Left ABI indicates normal arterial flow at rest.

## 2016-10-21 NOTE — Progress Notes (Signed)
ANTICOAGULATION CONSULT NOTE - Follow-up Consult  Pharmacy Consult for Heparin Indication: chest pain/ACS, awaiting CABG/AVR (planned 11/30)  Allergies  Allergen Reactions  . Doxycycline Hives  . Penicillins Hives    Has patient had a PCN reaction causing immediate rash, facial/tongue/throat swelling, SOB or lightheadedness with hypotension: No Has patient had a PCN reaction causing severe rash involving mucus membranes or skin necrosis: No Has patient had a PCN reaction that required hospitalization: No Has patient had a PCN reaction occurring within the last 10 years: Yes If all of the above answers are "NO", then may proceed with Cephalosporin use.   . Sulfa Antibiotics Hives    Patient Measurements: Height: 5\' 9"  (175.3 cm) Weight: 215 lb (97.5 kg) IBW/kg (Calculated) : 70.7  Heparin dosing wt: 91 kg  Vital Signs: Temp: 97.9 F (36.6 C) (11/28 2019) Temp Source: Oral (11/28 2019) BP: 125/82 (11/28 2019) Pulse Rate: 69 (11/28 2019)  Labs:  Recent Labs  10/19/16 1123 10/21/16 0217  HGB 15.1 13.9  HCT 43.1 40.5  PLT 153 151  APTT 34  --   LABPROT 11.2  --   INR 1.1  --   HEPARINUNFRC  --  0.29*  CREATININE 0.87  --     Estimated Creatinine Clearance: 91 mL/min (by C-G formula based on SCr of 0.87 mg/dL).   Assessment: 70 year old male s/p cath which showed severe LAD disease and AS. Plan for AVR/CABG 11/30. Pt on heparin with slightly subtherapeutic level (0.29) on gtt at 1300 units/hr. Hgb down a bit, plt stable. No issues with line or bleeding reported per RN.  Goal of Therapy:  Heparin level 0.3-0.7 units/ml Monitor platelets by anticoagulation protocol: Yes   Plan:  Increase heparin to 1500 units / hr  F/u 8 hr heparin level  Sherlon Handing, PharmD, BCPS Clinical pharmacist, pager [phone] 10/21/2016,3:13 AM

## 2016-10-22 ENCOUNTER — Inpatient Hospital Stay (HOSPITAL_COMMUNITY): Payer: Medicare Other

## 2016-10-22 ENCOUNTER — Inpatient Hospital Stay (HOSPITAL_COMMUNITY): Payer: Medicare Other | Admitting: Certified Registered Nurse Anesthetist

## 2016-10-22 ENCOUNTER — Encounter (HOSPITAL_COMMUNITY)
Admission: RE | Disposition: A | Discharge status: Nursing Home (Includes ICF) | Payer: Self-pay | Source: Ambulatory Visit | Attending: Thoracic Surgery (Cardiothoracic Vascular Surgery)

## 2016-10-22 ENCOUNTER — Encounter (HOSPITAL_COMMUNITY): Payer: Self-pay | Admitting: Thoracic Surgery (Cardiothoracic Vascular Surgery)

## 2016-10-22 DIAGNOSIS — I712 Thoracic aortic aneurysm, without rupture: Secondary | ICD-10-CM

## 2016-10-22 DIAGNOSIS — I5041 Acute combined systolic (congestive) and diastolic (congestive) heart failure: Secondary | ICD-10-CM | POA: Diagnosis not present

## 2016-10-22 DIAGNOSIS — Z9889 Other specified postprocedural states: Secondary | ICD-10-CM

## 2016-10-22 DIAGNOSIS — J9600 Acute respiratory failure, unspecified whether with hypoxia or hypercapnia: Secondary | ICD-10-CM | POA: Diagnosis not present

## 2016-10-22 DIAGNOSIS — D696 Thrombocytopenia, unspecified: Secondary | ICD-10-CM | POA: Diagnosis not present

## 2016-10-22 DIAGNOSIS — I340 Nonrheumatic mitral (valve) insufficiency: Secondary | ICD-10-CM | POA: Diagnosis present

## 2016-10-22 DIAGNOSIS — Z951 Presence of aortocoronary bypass graft: Secondary | ICD-10-CM

## 2016-10-22 DIAGNOSIS — I341 Nonrheumatic mitral (valve) prolapse: Secondary | ICD-10-CM

## 2016-10-22 DIAGNOSIS — I350 Nonrheumatic aortic (valve) stenosis: Secondary | ICD-10-CM

## 2016-10-22 DIAGNOSIS — Z8679 Personal history of other diseases of the circulatory system: Secondary | ICD-10-CM

## 2016-10-22 DIAGNOSIS — Z953 Presence of xenogenic heart valve: Secondary | ICD-10-CM

## 2016-10-22 DIAGNOSIS — I25110 Atherosclerotic heart disease of native coronary artery with unstable angina pectoris: Secondary | ICD-10-CM

## 2016-10-22 HISTORY — DX: Presence of aortocoronary bypass graft: Z95.1

## 2016-10-22 HISTORY — PX: AORTIC VALVE REPLACEMENT: SHX41

## 2016-10-22 HISTORY — DX: Other specified postprocedural states: Z98.890

## 2016-10-22 HISTORY — PX: THORACIC AORTIC ANEURYSM REPAIR: SHX799

## 2016-10-22 HISTORY — PX: CORONARY ARTERY BYPASS GRAFT: SHX141

## 2016-10-22 HISTORY — PX: MITRAL VALVE REPAIR: SHX2039

## 2016-10-22 HISTORY — PX: TEE WITHOUT CARDIOVERSION: SHX5443

## 2016-10-22 HISTORY — DX: Presence of xenogenic heart valve: Z95.3

## 2016-10-22 HISTORY — DX: Thrombocytopenia, unspecified: D69.6

## 2016-10-22 HISTORY — DX: Personal history of other diseases of the circulatory system: Z86.79

## 2016-10-22 LAB — POCT I-STAT, CHEM 8
BUN: 14 mg/dL (ref 6–20)
BUN: 11 mg/dL (ref 6–20)
BUN: 14 mg/dL (ref 6–20)
BUN: 15 mg/dL (ref 6–20)
BUN: 14 mg/dL (ref 6–20)
BUN: 15 mg/dL (ref 6–20)
BUN: 12 mg/dL (ref 6–20)
BUN: 13 mg/dL (ref 6–20)
BUN: 12 mg/dL (ref 6–20)
BUN: 10 mg/dL (ref 6–20)
BUN: 10 mg/dL (ref 6–20)
CALCIUM ION: 0.88 mmol/L — AB (ref 1.15–1.40)
CALCIUM ION: 1.12 mmol/L — AB (ref 1.15–1.40)
CALCIUM ION: 1.02 mmol/L — AB (ref 1.15–1.40)
CALCIUM ION: 1 mmol/L — AB (ref 1.15–1.40)
CALCIUM ION: 0.89 mmol/L — AB (ref 1.15–1.40)
CALCIUM ION: 1.08 mmol/L — AB (ref 1.15–1.40)
CALCIUM ION: 0.92 mmol/L — AB (ref 1.15–1.40)
CALCIUM ION: 1.45 mmol/L — AB (ref 1.15–1.40)
CALCIUM ION: 1.01 mmol/L — AB (ref 1.15–1.40)
CHLORIDE: 105 mmol/L (ref 101–111)
CHLORIDE: 104 mmol/L (ref 101–111)
CHLORIDE: 97 mmol/L — AB (ref 101–111)
CHLORIDE: 104 mmol/L (ref 101–111)
CHLORIDE: 110 mmol/L (ref 101–111)
CHLORIDE: 114 mmol/L — AB (ref 101–111)
CREATININE: 0.4 mg/dL — AB (ref 0.61–1.24)
CREATININE: 0.6 mg/dL — AB (ref 0.61–1.24)
CREATININE: 0.5 mg/dL — AB (ref 0.61–1.24)
CREATININE: 0.6 mg/dL — AB (ref 0.61–1.24)
CREATININE: 0.6 mg/dL — AB (ref 0.61–1.24)
Calcium, Ion: 1.22 mmol/L (ref 1.15–1.40)
Calcium, Ion: 1.26 mmol/L (ref 1.15–1.40)
Chloride: 102 mmol/L (ref 101–111)
Chloride: 107 mmol/L (ref 101–111)
Chloride: 107 mmol/L (ref 101–111)
Chloride: 114 mmol/L — ABNORMAL HIGH (ref 101–111)
Chloride: 111 mmol/L (ref 101–111)
Creatinine, Ser: 0.6 mg/dL — ABNORMAL LOW (ref 0.61–1.24)
Creatinine, Ser: 0.6 mg/dL — ABNORMAL LOW (ref 0.61–1.24)
Creatinine, Ser: 0.4 mg/dL — ABNORMAL LOW (ref 0.61–1.24)
Creatinine, Ser: 0.5 mg/dL — ABNORMAL LOW (ref 0.61–1.24)
Creatinine, Ser: 0.6 mg/dL — ABNORMAL LOW (ref 0.61–1.24)
Creatinine, Ser: 0.6 mg/dL — ABNORMAL LOW (ref 0.61–1.24)
GLUCOSE: 128 mg/dL — AB (ref 65–99)
GLUCOSE: 102 mg/dL — AB (ref 65–99)
GLUCOSE: 129 mg/dL — AB (ref 65–99)
GLUCOSE: 167 mg/dL — AB (ref 65–99)
GLUCOSE: 189 mg/dL — AB (ref 65–99)
GLUCOSE: 127 mg/dL — AB (ref 65–99)
GLUCOSE: 115 mg/dL — AB (ref 65–99)
Glucose, Bld: 145 mg/dL — ABNORMAL HIGH (ref 65–99)
Glucose, Bld: 123 mg/dL — ABNORMAL HIGH (ref 65–99)
Glucose, Bld: 98 mg/dL (ref 65–99)
Glucose, Bld: 106 mg/dL — ABNORMAL HIGH (ref 65–99)
HCT: 27 % — ABNORMAL LOW (ref 39.0–52.0)
HCT: 31 % — ABNORMAL LOW (ref 39.0–52.0)
HCT: 27 % — ABNORMAL LOW (ref 39.0–52.0)
HCT: 27 % — ABNORMAL LOW (ref 39.0–52.0)
HCT: 26 % — ABNORMAL LOW (ref 39.0–52.0)
HCT: 27 % — ABNORMAL LOW (ref 39.0–52.0)
HCT: 30 % — ABNORMAL LOW (ref 39.0–52.0)
HEMATOCRIT: 35 % — AB (ref 39.0–52.0)
HEMATOCRIT: 36 % — AB (ref 39.0–52.0)
HEMATOCRIT: 27 % — AB (ref 39.0–52.0)
HEMATOCRIT: 31 % — AB (ref 39.0–52.0)
HEMOGLOBIN: 11.9 g/dL — AB (ref 13.0–17.0)
HEMOGLOBIN: 12.2 g/dL — AB (ref 13.0–17.0)
HEMOGLOBIN: 9.2 g/dL — AB (ref 13.0–17.0)
HEMOGLOBIN: 9.2 g/dL — AB (ref 13.0–17.0)
HEMOGLOBIN: 9.2 g/dL — AB (ref 13.0–17.0)
HEMOGLOBIN: 8.8 g/dL — AB (ref 13.0–17.0)
HEMOGLOBIN: 9.2 g/dL — AB (ref 13.0–17.0)
HEMOGLOBIN: 10.2 g/dL — AB (ref 13.0–17.0)
HEMOGLOBIN: 10.5 g/dL — AB (ref 13.0–17.0)
Hemoglobin: 10.5 g/dL — ABNORMAL LOW (ref 13.0–17.0)
Hemoglobin: 9.2 g/dL — ABNORMAL LOW (ref 13.0–17.0)
POTASSIUM: 3.9 mmol/L (ref 3.5–5.1)
POTASSIUM: 4.4 mmol/L (ref 3.5–5.1)
POTASSIUM: 4.6 mmol/L (ref 3.5–5.1)
POTASSIUM: 4.7 mmol/L (ref 3.5–5.1)
Potassium: 6 mmol/L — ABNORMAL HIGH (ref 3.5–5.1)
Potassium: 5.8 mmol/L — ABNORMAL HIGH (ref 3.5–5.1)
Potassium: 5.4 mmol/L — ABNORMAL HIGH (ref 3.5–5.1)
Potassium: 4.4 mmol/L (ref 3.5–5.1)
Potassium: 4.6 mmol/L (ref 3.5–5.1)
Potassium: 4.9 mmol/L (ref 3.5–5.1)
Potassium: 4 mmol/L (ref 3.5–5.1)
SODIUM: 140 mmol/L (ref 135–145)
SODIUM: 135 mmol/L (ref 135–145)
SODIUM: 142 mmol/L (ref 135–145)
SODIUM: 143 mmol/L (ref 135–145)
SODIUM: 143 mmol/L (ref 135–145)
SODIUM: 144 mmol/L (ref 135–145)
SODIUM: 147 mmol/L — AB (ref 135–145)
Sodium: 136 mmol/L (ref 135–145)
Sodium: 139 mmol/L (ref 135–145)
Sodium: 134 mmol/L — ABNORMAL LOW (ref 135–145)
Sodium: 137 mmol/L (ref 135–145)
TCO2: 25 mmol/L (ref 0–100)
TCO2: 23 mmol/L (ref 0–100)
TCO2: 24 mmol/L (ref 0–100)
TCO2: 27 mmol/L (ref 0–100)
TCO2: 24 mmol/L (ref 0–100)
TCO2: 23 mmol/L (ref 0–100)
TCO2: 21 mmol/L (ref 0–100)
TCO2: 22 mmol/L (ref 0–100)
TCO2: 21 mmol/L (ref 0–100)
TCO2: 19 mmol/L (ref 0–100)
TCO2: 20 mmol/L (ref 0–100)

## 2016-10-22 LAB — CBC
HCT: 41.4 % (ref 39.0–52.0)
HCT: 34.5 % — ABNORMAL LOW (ref 39.0–52.0)
HCT: 33.5 % — ABNORMAL LOW (ref 39.0–52.0)
HEMOGLOBIN: 11.9 g/dL — AB (ref 13.0–17.0)
Hemoglobin: 14.4 g/dL (ref 13.0–17.0)
Hemoglobin: 11.8 g/dL — ABNORMAL LOW (ref 13.0–17.0)
MCH: 30 pg (ref 26.0–34.0)
MCH: 29.5 pg (ref 26.0–34.0)
MCH: 29.9 pg (ref 26.0–34.0)
MCHC: 34.8 g/dL (ref 30.0–36.0)
MCHC: 34.5 g/dL (ref 30.0–36.0)
MCHC: 35.2 g/dL (ref 30.0–36.0)
MCV: 86.3 fL (ref 78.0–100.0)
MCV: 85.6 fL (ref 78.0–100.0)
MCV: 84.8 fL (ref 78.0–100.0)
PLATELETS: 147 10*3/uL — AB (ref 150–400)
PLATELETS: 84 10*3/uL — AB (ref 150–400)
PLATELETS: 70 10*3/uL — AB (ref 150–400)
RBC: 4.8 MIL/uL (ref 4.22–5.81)
RBC: 4.03 MIL/uL — ABNORMAL LOW (ref 4.22–5.81)
RBC: 3.95 MIL/uL — ABNORMAL LOW (ref 4.22–5.81)
RDW: 14 % (ref 11.5–15.5)
RDW: 13.9 % (ref 11.5–15.5)
RDW: 13.9 % (ref 11.5–15.5)
WBC: 4 10*3/uL (ref 4.0–10.5)
WBC: 13.2 10*3/uL — ABNORMAL HIGH (ref 4.0–10.5)
WBC: 11.1 10*3/uL — ABNORMAL HIGH (ref 4.0–10.5)

## 2016-10-22 LAB — PREPARE RBC (CROSSMATCH)

## 2016-10-22 LAB — PROTIME-INR
INR: 1.85
INR: 1.74
PROTHROMBIN TIME: 20.6 s — AB (ref 11.4–15.2)
Prothrombin Time: 21.6 seconds — ABNORMAL HIGH (ref 11.4–15.2)

## 2016-10-22 LAB — POCT I-STAT 3, ART BLOOD GAS (G3+)
ACID-BASE DEFICIT: 9 mmol/L — AB (ref 0.0–2.0)
Acid-base deficit: 2 mmol/L (ref 0.0–2.0)
Acid-base deficit: 6 mmol/L — ABNORMAL HIGH (ref 0.0–2.0)
Acid-base deficit: 11 mmol/L — ABNORMAL HIGH (ref 0.0–2.0)
Acid-base deficit: 6 mmol/L — ABNORMAL HIGH (ref 0.0–2.0)
Acid-base deficit: 7 mmol/L — ABNORMAL HIGH (ref 0.0–2.0)
BICARBONATE: 23.3 mmol/L (ref 20.0–28.0)
BICARBONATE: 24.9 mmol/L (ref 20.0–28.0)
BICARBONATE: 18.6 mmol/L — AB (ref 20.0–28.0)
Bicarbonate: 21.1 mmol/L (ref 20.0–28.0)
Bicarbonate: 18.1 mmol/L — ABNORMAL LOW (ref 20.0–28.0)
Bicarbonate: 18.5 mmol/L — ABNORMAL LOW (ref 20.0–28.0)
Bicarbonate: 19.6 mmol/L — ABNORMAL LOW (ref 20.0–28.0)
O2 SAT: 100 %
O2 SAT: 94 %
O2 SAT: 96 %
O2 Saturation: 100 %
O2 Saturation: 100 %
O2 Saturation: 80 %
O2 Saturation: 100 %
PCO2 ART: 38.7 mmHg (ref 32.0–48.0)
PCO2 ART: 47.2 mmHg (ref 32.0–48.0)
PCO2 ART: 59.5 mmHg — AB (ref 32.0–48.0)
PCO2 ART: 40.1 mmHg (ref 32.0–48.0)
PCO2 ART: 34 mmHg (ref 32.0–48.0)
PH ART: 7.388 (ref 7.350–7.450)
PH ART: 7.259 — AB (ref 7.350–7.450)
PH ART: 7.101 — AB (ref 7.350–7.450)
PH ART: 7.297 — AB (ref 7.350–7.450)
PH ART: 7.342 — AB (ref 7.350–7.450)
PO2 ART: 378 mmHg — AB (ref 83.0–108.0)
PO2 ART: 454 mmHg — AB (ref 83.0–108.0)
PO2 ART: 85 mmHg (ref 83.0–108.0)
PO2 ART: 92 mmHg (ref 83.0–108.0)
Patient temperature: 36.3
TCO2: 24 mmol/L (ref 0–100)
TCO2: 26 mmol/L (ref 0–100)
TCO2: 23 mmol/L (ref 0–100)
TCO2: 19 mmol/L (ref 0–100)
TCO2: 20 mmol/L (ref 0–100)
TCO2: 21 mmol/L (ref 0–100)
TCO2: 20 mmol/L (ref 0–100)
pCO2 arterial: 40.9 mmHg (ref 32.0–48.0)
pCO2 arterial: 44.2 mmHg (ref 32.0–48.0)
pH, Arterial: 7.393 (ref 7.350–7.450)
pH, Arterial: 7.22 — ABNORMAL LOW (ref 7.350–7.450)
pO2, Arterial: 345 mmHg — ABNORMAL HIGH (ref 83.0–108.0)
pO2, Arterial: 60 mmHg — ABNORMAL LOW (ref 83.0–108.0)
pO2, Arterial: 254 mmHg — ABNORMAL HIGH (ref 83.0–108.0)

## 2016-10-22 LAB — HEMOGLOBIN AND HEMATOCRIT, BLOOD
HEMATOCRIT: 29 % — AB (ref 39.0–52.0)
HEMATOCRIT: 31 % — AB (ref 39.0–52.0)
HEMOGLOBIN: 10.1 g/dL — AB (ref 13.0–17.0)
Hemoglobin: 10.8 g/dL — ABNORMAL LOW (ref 13.0–17.0)

## 2016-10-22 LAB — HEMOGLOBIN A1C
HEMOGLOBIN A1C: 6.6 % — AB (ref 4.8–5.6)
Mean Plasma Glucose: 143 mg/dL

## 2016-10-22 LAB — BASIC METABOLIC PANEL
Anion gap: 11 (ref 5–15)
BUN: 14 mg/dL (ref 6–20)
CALCIUM: 9.4 mg/dL (ref 8.9–10.3)
CO2: 19 mmol/L — ABNORMAL LOW (ref 22–32)
CREATININE: 0.79 mg/dL (ref 0.61–1.24)
Chloride: 107 mmol/L (ref 101–111)
GFR calc Af Amer: >60 mL/min (ref >60)
Glucose, Bld: 138 mg/dL — ABNORMAL HIGH (ref 65–99)
POTASSIUM: 4.1 mmol/L (ref 3.5–5.1)
SODIUM: 137 mmol/L (ref 135–145)

## 2016-10-22 LAB — POCT I-STAT 4, (NA,K, GLUC, HGB,HCT)
Glucose, Bld: 134 mg/dL — ABNORMAL HIGH (ref 65–99)
HCT: 31 % — ABNORMAL LOW (ref 39.0–52.0)
Hemoglobin: 10.5 g/dL — ABNORMAL LOW (ref 13.0–17.0)
Potassium: 4.1 mmol/L (ref 3.5–5.1)
Sodium: 146 mmol/L — ABNORMAL HIGH (ref 135–145)

## 2016-10-22 LAB — GLUCOSE, CAPILLARY: GLUCOSE-CAPILLARY: 136 mg/dL — AB (ref 65–99)

## 2016-10-22 LAB — PLATELET COUNT
PLATELETS: 63 10*3/uL — AB (ref 150–400)
Platelets: 29 10*3/uL — CL (ref 150–400)

## 2016-10-22 LAB — APTT
aPTT: 56 seconds — ABNORMAL HIGH (ref 24–36)
aPTT: 47 seconds — ABNORMAL HIGH (ref 24–36)

## 2016-10-22 LAB — FIBRINOGEN
FIBRINOGEN: 149 mg/dL — AB (ref 210–475)
Fibrinogen: 67 mg/dL — CL (ref 210–475)

## 2016-10-22 SURGERY — CORONARY ARTERY BYPASS GRAFTING (CABG)
Anesthesia: General | Site: Chest

## 2016-10-22 MED ORDER — MIDAZOLAM HCL 5 MG/5ML IJ SOLN
INTRAMUSCULAR | Status: DC | PRN
  Administered 2016-10-22: 2 mg via INTRAVENOUS
  Administered 2016-10-22: 1 mg via INTRAVENOUS
  Administered 2016-10-22: 2 mg via INTRAVENOUS
  Administered 2016-10-22: 1 mg via INTRAVENOUS

## 2016-10-22 MED ORDER — SODIUM CHLORIDE 0.9 % IV SOLN
INTRAVENOUS | Status: DC | PRN
  Administered 2016-10-22: 20:00:00 via INTRAVENOUS

## 2016-10-22 MED ORDER — ROCURONIUM BROMIDE 10 MG/ML (PF) SYRINGE
PREFILLED_SYRINGE | INTRAVENOUS | Status: AC
  Filled 2016-10-22: qty 10

## 2016-10-22 MED ORDER — DEXMEDETOMIDINE HCL IN NACL 400 MCG/100ML IV SOLN
0.1000 ug/kg/h | INTRAVENOUS | Status: DC
  Filled 2016-10-22: qty 100

## 2016-10-22 MED ORDER — BISACODYL 10 MG RE SUPP
10.0000 mg | Freq: Every day | RECTAL | Status: DC
  Administered 2016-10-24 – 2016-10-25 (×2): 10 mg via RECTAL
  Filled 2016-10-22 (×3): qty 1

## 2016-10-22 MED ORDER — ALBUMIN HUMAN 5 % IV SOLN
250.0000 mL | INTRAVENOUS | Status: AC | PRN
  Administered 2016-10-23 (×2): 250 mL via INTRAVENOUS

## 2016-10-22 MED ORDER — OXYCODONE HCL 5 MG PO TABS: 5.0000 mg | ORAL_TABLET | ORAL | Status: DC | PRN

## 2016-10-22 MED ORDER — CALCIUM CHLORIDE 10 % IV SOLN
INTRAVENOUS | Status: DC | PRN
Start: 2016-10-22 — End: 2016-10-22
  Administered 2016-10-22: 1 g via INTRAVENOUS
  Administered 2016-10-22: .3 g via INTRAVENOUS
  Administered 2016-10-22 (×5): .2 g via INTRAVENOUS
  Administered 2016-10-22: .3 g via INTRAVENOUS

## 2016-10-22 MED ORDER — MORPHINE SULFATE (PF) 2 MG/ML IV SOLN
1.0000 mg | INTRAVENOUS | Status: AC | PRN
  Administered 2016-10-23 (×3): 4 mg via INTRAVENOUS
  Filled 2016-10-22 (×3): qty 2
  Filled 2016-10-22: qty 1

## 2016-10-22 MED ORDER — ACETAMINOPHEN 160 MG/5ML PO SOLN
1000.0000 mg | Freq: Four times a day (QID) | ORAL | Status: DC
  Administered 2016-10-23 – 2016-10-26 (×13): 1000 mg
  Filled 2016-10-22 (×13): qty 40.6

## 2016-10-22 MED ORDER — SODIUM CHLORIDE 0.9 % IV SOLN: INTRAVENOUS | Status: DC

## 2016-10-22 MED ORDER — FAMOTIDINE IN NACL 20-0.9 MG/50ML-% IV SOLN
20.0000 mg | Freq: Two times a day (BID) | INTRAVENOUS | Status: DC
  Administered 2016-10-22 – 2016-10-25 (×7): 20 mg via INTRAVENOUS
  Filled 2016-10-22 (×7): qty 50

## 2016-10-22 MED ORDER — HEMOSTATIC AGENTS (NO CHARGE) OPTIME
TOPICAL | Status: DC | PRN
  Administered 2016-10-22 (×4): 1 via TOPICAL

## 2016-10-22 MED ORDER — SODIUM CHLORIDE 0.9 % IJ SOLN
OROMUCOSAL | Status: DC | PRN
  Administered 2016-10-22 (×5): 4 mL via TOPICAL

## 2016-10-22 MED ORDER — CHLORHEXIDINE GLUCONATE 0.12 % MT SOLN: 15.0000 mL | OROMUCOSAL | Status: AC

## 2016-10-22 MED ORDER — METOPROLOL TARTRATE 5 MG/5ML IV SOLN: 2.5000 mg | INTRAVENOUS | Status: DC | PRN

## 2016-10-22 MED ORDER — HEPARIN SODIUM (PORCINE) 1000 UNIT/ML IJ SOLN
INTRAMUSCULAR | Status: AC
  Filled 2016-10-22: qty 1

## 2016-10-22 MED ORDER — VANCOMYCIN HCL IN DEXTROSE 1-5 GM/200ML-% IV SOLN
1000.0000 mg | Freq: Once | INTRAVENOUS | Status: AC
  Administered 2016-10-23: 1000 mg via INTRAVENOUS
  Filled 2016-10-22: qty 200

## 2016-10-22 MED ORDER — FENTANYL CITRATE (PF) 250 MCG/5ML IJ SOLN
INTRAMUSCULAR | Status: AC
  Filled 2016-10-22: qty 25

## 2016-10-22 MED ORDER — ARTIFICIAL TEARS OP OINT
TOPICAL_OINTMENT | OPHTHALMIC | Status: DC | PRN
  Administered 2016-10-22: 1 via OPHTHALMIC

## 2016-10-22 MED ORDER — PHENYLEPHRINE HCL 10 MG/ML IJ SOLN
INTRAMUSCULAR | Status: DC | PRN
  Administered 2016-10-22 (×2): 120 ug via INTRAVENOUS
  Administered 2016-10-22: 80 ug via INTRAVENOUS

## 2016-10-22 MED ORDER — EPHEDRINE SULFATE-NACL 50-0.9 MG/10ML-% IV SOSY
PREFILLED_SYRINGE | INTRAVENOUS | Status: DC | PRN
  Administered 2016-10-22: 10 mg via INTRAVENOUS

## 2016-10-22 MED ORDER — DOCUSATE SODIUM 100 MG PO CAPS: 200.0000 mg | ORAL_CAPSULE | Freq: Every day | ORAL | Status: DC

## 2016-10-22 MED ORDER — ARTIFICIAL TEARS OP OINT
TOPICAL_OINTMENT | OPHTHALMIC | Status: AC
  Filled 2016-10-22: qty 3.5

## 2016-10-22 MED ORDER — SODIUM CHLORIDE 0.9 % IV SOLN
INTRAVENOUS | Status: DC
  Administered 2016-10-22: 23:00:00 via INTRAVENOUS
  Administered 2016-10-23: 3.6 [IU]/h via INTRAVENOUS
  Administered 2016-10-24: 2.4 [IU]/h via INTRAVENOUS
  Administered 2016-10-25: 13 [IU]/h via INTRAVENOUS
  Administered 2016-10-25: 9.1 [IU]/h via INTRAVENOUS
  Filled 2016-10-22 (×5): qty 2.5

## 2016-10-22 MED ORDER — LACTATED RINGERS IV SOLN: 500.0000 mL | Freq: Once | INTRAVENOUS | Status: DC | PRN

## 2016-10-22 MED ORDER — MIDAZOLAM HCL 2 MG/2ML IJ SOLN
2.0000 mg | INTRAMUSCULAR | Status: DC | PRN
  Administered 2016-10-23 – 2016-10-26 (×22): 2 mg via INTRAVENOUS
  Filled 2016-10-22 (×22): qty 2

## 2016-10-22 MED ORDER — NOREPINEPHRINE BITARTRATE 1 MG/ML IV SOLN
0.0000 ug/min | INTRAVENOUS | Status: DC
  Administered 2016-10-22: 12 ug/min via INTRAVENOUS
  Filled 2016-10-22 (×2): qty 4

## 2016-10-22 MED ORDER — PHENYLEPHRINE HCL 10 MG/ML IJ SOLN
INTRAVENOUS | Status: DC | PRN
  Administered 2016-10-22: 20 ug/min via INTRAVENOUS

## 2016-10-22 MED ORDER — ALBUMIN HUMAN 5 % IV SOLN
INTRAVENOUS | Status: DC | PRN
  Administered 2016-10-22 (×4): via INTRAVENOUS

## 2016-10-22 MED ORDER — ONDANSETRON HCL 4 MG/2ML IJ SOLN: 4.0000 mg | Freq: Four times a day (QID) | INTRAMUSCULAR | Status: DC | PRN

## 2016-10-22 MED ORDER — TRAMADOL HCL 50 MG PO TABS: 50.0000 mg | ORAL_TABLET | ORAL | Status: DC | PRN

## 2016-10-22 MED ORDER — MAGNESIUM SULFATE 4 GM/100ML IV SOLN
4.0000 g | Freq: Once | INTRAVENOUS | Status: AC
  Administered 2016-10-22: 4 g via INTRAVENOUS
  Filled 2016-10-22: qty 100

## 2016-10-22 MED ORDER — PHENYLEPHRINE HCL 10 MG/ML IJ SOLN
0.0000 ug/min | INTRAVENOUS | Status: DC
  Administered 2016-10-23: 50 ug/min via INTRAVENOUS
  Filled 2016-10-22 (×2): qty 2

## 2016-10-22 MED ORDER — LEVOFLOXACIN IN D5W 500 MG/100ML IV SOLN
500.0000 mg | INTRAVENOUS | Status: DC
  Filled 2016-10-22: qty 100

## 2016-10-22 MED ORDER — SODIUM CHLORIDE 0.9 % IV SOLN: INTRAVENOUS | Status: AC

## 2016-10-22 MED ORDER — PROPOFOL 10 MG/ML IV BOLUS
INTRAVENOUS | Status: DC | PRN
  Administered 2016-10-22: 60 mg via INTRAVENOUS

## 2016-10-22 MED ORDER — PROTAMINE SULFATE 10 MG/ML IV SOLN
INTRAVENOUS | Status: AC
  Filled 2016-10-22: qty 25

## 2016-10-22 MED ORDER — INSULIN REGULAR BOLUS VIA INFUSION
0.0000 [IU] | Freq: Three times a day (TID) | INTRAVENOUS | Status: DC
  Filled 2016-10-22: qty 10

## 2016-10-22 MED ORDER — LACTATED RINGERS IV SOLN
INTRAVENOUS | Status: DC
  Administered 2016-10-22: 20 mL via INTRAVENOUS

## 2016-10-22 MED ORDER — EPHEDRINE SULFATE 50 MG/ML IJ SOLN
INTRAMUSCULAR | Status: DC | PRN
  Administered 2016-10-22: 5 mg via INTRAVENOUS
  Administered 2016-10-22: 10 mg via INTRAVENOUS
  Administered 2016-10-22 (×3): 5 mg via INTRAVENOUS

## 2016-10-22 MED ORDER — SODIUM CHLORIDE 0.9 % IV SOLN: Freq: Once | INTRAVENOUS | Status: DC

## 2016-10-22 MED ORDER — SODIUM CHLORIDE 0.45 % IV SOLN
INTRAVENOUS | Status: DC | PRN
  Administered 2016-10-22: 20 mL via INTRAVENOUS

## 2016-10-22 MED ORDER — SODIUM CHLORIDE 0.9 % IV SOLN
0.0300 [IU]/min | INTRAVENOUS | Status: DC
  Administered 2016-10-23 – 2016-10-25 (×4): 0.03 [IU]/min via INTRAVENOUS
  Filled 2016-10-22 (×5): qty 2

## 2016-10-22 MED ORDER — DEXMEDETOMIDINE HCL IN NACL 200 MCG/50ML IV SOLN
INTRAVENOUS | Status: AC
  Filled 2016-10-22: qty 50

## 2016-10-22 MED ORDER — PROPOFOL 10 MG/ML IV BOLUS
INTRAVENOUS | Status: AC
  Filled 2016-10-22: qty 20

## 2016-10-22 MED ORDER — LACTATED RINGERS IV SOLN
INTRAVENOUS | Status: DC
  Administered 2016-10-22: 20 mL/h via INTRAVENOUS

## 2016-10-22 MED ORDER — ROCURONIUM BROMIDE 100 MG/10ML IV SOLN
INTRAVENOUS | Status: DC | PRN
  Administered 2016-10-22 (×4): 50 mg via INTRAVENOUS
  Administered 2016-10-22: 100 mg via INTRAVENOUS
  Administered 2016-10-22 (×2): 50 mg via INTRAVENOUS

## 2016-10-22 MED ORDER — PROTAMINE SULFATE 10 MG/ML IV SOLN
INTRAVENOUS | Status: AC
  Filled 2016-10-22: qty 5

## 2016-10-22 MED ORDER — ACETAMINOPHEN 650 MG RE SUPP
650.0000 mg | Freq: Once | RECTAL | Status: AC
  Administered 2016-10-22: 650 mg via RECTAL

## 2016-10-22 MED ORDER — HEPARIN SODIUM (PORCINE) 1000 UNIT/ML IJ SOLN
INTRAMUSCULAR | Status: DC | PRN
  Administered 2016-10-22: 27000 [IU] via INTRAVENOUS
  Administered 2016-10-22: 3000 [IU] via INTRAVENOUS

## 2016-10-22 MED ORDER — PROTAMINE SULFATE 10 MG/ML IV SOLN
INTRAVENOUS | Status: DC | PRN
  Administered 2016-10-22: 30 mg via INTRAVENOUS

## 2016-10-22 MED ORDER — DEXMEDETOMIDINE HCL IN NACL 200 MCG/50ML IV SOLN
0.0000 ug/kg/h | INTRAVENOUS | Status: DC
  Administered 2016-10-22: 0.7 ug/h via INTRAVENOUS
  Administered 2016-10-22: 0.7 ug/kg/h via INTRAVENOUS
  Filled 2016-10-22: qty 50

## 2016-10-22 MED ORDER — SODIUM CHLORIDE 0.9% FLUSH
3.0000 mL | Freq: Two times a day (BID) | INTRAVENOUS | Status: DC
  Administered 2016-10-23 – 2016-10-25 (×6): 3 mL via INTRAVENOUS

## 2016-10-22 MED ORDER — VASOPRESSIN 20 UNIT/ML IV SOLN
0.0300 [IU]/min | INTRAVENOUS | Status: DC
  Administered 2016-10-22: .03 [IU]/min via INTRAVENOUS
  Filled 2016-10-22: qty 2

## 2016-10-22 MED ORDER — VASOPRESSIN 20 UNIT/ML IV SOLN
INTRAVENOUS | Status: AC
  Filled 2016-10-22: qty 1

## 2016-10-22 MED ORDER — ALBUMIN HUMAN 5 % IV SOLN: 25.0000 g | INTRAVENOUS | Status: DC

## 2016-10-22 MED ORDER — MILRINONE LACTATE IN DEXTROSE 20-5 MG/100ML-% IV SOLN
0.3000 ug/kg/min | INTRAVENOUS | Status: DC
  Administered 2016-10-22: 0.3 ug/kg/min via INTRAVENOUS
  Administered 2016-10-22: 0.375 ug/kg/min via INTRAVENOUS
  Administered 2016-10-23: 0.3 ug/kg/min via INTRAVENOUS
  Filled 2016-10-22: qty 100

## 2016-10-22 MED ORDER — BISACODYL 5 MG PO TBEC: 10.0000 mg | DELAYED_RELEASE_TABLET | Freq: Every day | ORAL | Status: DC

## 2016-10-22 MED ORDER — SODIUM CHLORIDE 0.9% FLUSH: 3.0000 mL | INTRAVENOUS | Status: DC | PRN

## 2016-10-22 MED ORDER — LACTATED RINGERS IV SOLN
INTRAVENOUS | Status: DC | PRN
  Administered 2016-10-22 (×6): via INTRAVENOUS

## 2016-10-22 MED ORDER — SODIUM CHLORIDE 0.9 % IV SOLN
30.0000 meq | Freq: Once | INTRAVENOUS | Status: DC
  Filled 2016-10-22: qty 15

## 2016-10-22 MED ORDER — FENTANYL CITRATE (PF) 250 MCG/5ML IJ SOLN
INTRAMUSCULAR | Status: DC | PRN
  Administered 2016-10-22: 300 ug via INTRAVENOUS
  Administered 2016-10-22: 100 ug via INTRAVENOUS
  Administered 2016-10-22: 50 ug via INTRAVENOUS
  Administered 2016-10-22: 400 ug via INTRAVENOUS
  Administered 2016-10-22: 150 ug via INTRAVENOUS

## 2016-10-22 MED ORDER — ASPIRIN 81 MG PO CHEW
324.0000 mg | CHEWABLE_TABLET | Freq: Every day | ORAL | Status: DC
  Administered 2016-10-23 – 2016-10-25 (×3): 324 mg
  Filled 2016-10-22 (×4): qty 4

## 2016-10-22 MED ORDER — MIDAZOLAM HCL 10 MG/2ML IJ SOLN
INTRAMUSCULAR | Status: AC
  Filled 2016-10-22: qty 2

## 2016-10-22 MED ORDER — 0.9 % SODIUM CHLORIDE (POUR BTL) OPTIME
TOPICAL | Status: DC | PRN
  Administered 2016-10-22: 1000 mL
  Administered 2016-10-22: 6000 mL
  Administered 2016-10-22: 1000 mL

## 2016-10-22 MED ORDER — TRANEXAMIC ACID 1000 MG/10ML IV SOLN
1000.0000 mg | INTRAVENOUS | Status: DC
  Filled 2016-10-22: qty 10

## 2016-10-22 MED ORDER — SODIUM CHLORIDE 0.9 % IV SOLN: 250.0000 mL | INTRAVENOUS | Status: DC

## 2016-10-22 MED ORDER — NOREPINEPHRINE BITARTRATE 1 MG/ML IV SOLN
0.0000 ug/min | INTRAVENOUS | Status: DC
  Filled 2016-10-22: qty 4

## 2016-10-22 MED ORDER — ACETAMINOPHEN 500 MG PO TABS: 1000.0000 mg | ORAL_TABLET | Freq: Four times a day (QID) | ORAL | Status: DC

## 2016-10-22 MED ORDER — SODIUM CHLORIDE 0.9 % IJ SOLN
INTRAMUSCULAR | Status: AC
Start: 2016-10-22 — End: 2016-10-22
  Filled 2016-10-22: qty 10

## 2016-10-22 MED ORDER — MILRINONE LACTATE IN DEXTROSE 20-5 MG/100ML-% IV SOLN
0.3750 ug/kg/min | INTRAVENOUS | Status: AC
  Administered 2016-10-22: .375 mg/kg/min via INTRAVENOUS
  Filled 2016-10-22: qty 100

## 2016-10-22 MED ORDER — ACETAMINOPHEN 160 MG/5ML PO SOLN: 650.0000 mg | Freq: Once | ORAL | Status: AC

## 2016-10-22 MED ORDER — ATROPINE SULFATE 1 MG/ML IJ SOLN
INTRAMUSCULAR | Status: AC
  Filled 2016-10-22: qty 1

## 2016-10-22 MED ORDER — ASPIRIN EC 325 MG PO TBEC: 325.0000 mg | DELAYED_RELEASE_TABLET | Freq: Every day | ORAL | Status: DC

## 2016-10-22 MED ORDER — NOREPINEPHRINE BITARTRATE 1 MG/ML IV SOLN
INTRAVENOUS | Status: DC | PRN
  Administered 2016-10-22: 4 ug/min via INTRAVENOUS

## 2016-10-22 MED ORDER — LEVOFLOXACIN IN D5W 750 MG/150ML IV SOLN
750.0000 mg | INTRAVENOUS | Status: AC
  Administered 2016-10-23: 750 mg via INTRAVENOUS
  Filled 2016-10-22: qty 150

## 2016-10-22 MED ORDER — MORPHINE SULFATE (PF) 2 MG/ML IV SOLN
1.0000 mg | INTRAVENOUS | Status: DC | PRN
  Administered 2016-10-23 (×5): 2 mg via INTRAVENOUS
  Filled 2016-10-22 (×4): qty 1

## 2016-10-22 MED ORDER — VASOPRESSIN 20 UNIT/ML IV SOLN
INTRAVENOUS | Status: DC | PRN
  Administered 2016-10-22: 1 [IU] via INTRAVENOUS
  Administered 2016-10-22: 2 [IU] via INTRAVENOUS
  Administered 2016-10-22 (×8): 1 [IU] via INTRAVENOUS
  Administered 2016-10-22: 2 [IU] via INTRAVENOUS
  Administered 2016-10-22: 1 [IU] via INTRAVENOUS
  Administered 2016-10-22: 2 [IU] via INTRAVENOUS
  Administered 2016-10-22: 1 [IU] via INTRAVENOUS

## 2016-10-22 MED ORDER — SODIUM CHLORIDE 0.9 % IR SOLN
Status: DC | PRN
  Administered 2016-10-22: 3000 mL

## 2016-10-22 MED ORDER — VANCOMYCIN HCL 10 G IV SOLR
1250.0000 mg | INTRAVENOUS | Status: DC
  Filled 2016-10-22: qty 1250

## 2016-10-22 MED ORDER — NITROGLYCERIN IN D5W 200-5 MCG/ML-% IV SOLN: 0.0000 ug/min | INTRAVENOUS | Status: DC

## 2016-10-22 MED FILL — Potassium Chloride Inj 2 mEq/ML: INTRAVENOUS | Qty: 40 | Status: AC

## 2016-10-22 MED FILL — Magnesium Sulfate Inj 50%: INTRAMUSCULAR | Qty: 10 | Status: AC

## 2016-10-22 MED FILL — Heparin Sodium (Porcine) Inj 1000 Unit/ML: INTRAMUSCULAR | Qty: 30 | Status: AC

## 2016-10-22 SURGICAL SUPPLY — 177 items
ADAPTER CARDIO PERF ANTE/RETRO (ADAPTER) ×5 IMPLANT
ADH SKN CLS APL DERMABOND .7 (GAUZE/BANDAGES/DRESSINGS) ×3
ADPR PRFSN 84XANTGRD RTRGD (ADAPTER) ×6
APPLIER CLIP 9.375 SM OPEN (CLIP) ×4
APR CLP SM 9.3 20 MLT OPN (CLIP) ×3
BAG DECANTER FOR FLEXI CONT (MISCELLANEOUS) ×8 IMPLANT
BANDAGE ACE 4X5 VEL STRL LF (GAUZE/BANDAGES/DRESSINGS) ×3 IMPLANT
BANDAGE ACE 6X5 VEL STRL LF (GAUZE/BANDAGES/DRESSINGS) ×3 IMPLANT
BANDAGE ELASTIC 4 VELCRO ST LF (GAUZE/BANDAGES/DRESSINGS) ×1 IMPLANT
BANDAGE ELASTIC 6 VELCRO ST LF (GAUZE/BANDAGES/DRESSINGS) ×1 IMPLANT
BANDAGE ESMARK 6X9 LF (GAUZE/BANDAGES/DRESSINGS) IMPLANT
BASKET HEART (ORDER IN 25'S) (MISCELLANEOUS) ×1
BASKET HEART (ORDER IN 25S) (MISCELLANEOUS) ×3 IMPLANT
BLADE STERNUM SYSTEM 6 (BLADE) ×4 IMPLANT
BLADE SURG 11 STRL SS (BLADE) ×6 IMPLANT
BLADE SURG ROTATE 9660 (MISCELLANEOUS) IMPLANT
BNDG CMPR 9X6 STRL LF SNTH (GAUZE/BANDAGES/DRESSINGS) ×3
BNDG ESMARK 6X9 LF (GAUZE/BANDAGES/DRESSINGS) ×4
BNDG GAUZE ELAST 4 BULKY (GAUZE/BANDAGES/DRESSINGS) ×4 IMPLANT
CANISTER SUCTION 2500CC (MISCELLANEOUS) ×5 IMPLANT
CANISTER WOUND CARE 500ML ATS (WOUND CARE) ×1 IMPLANT
CANNULA AORTIC ROOT 9FR (CANNULA) ×2 IMPLANT
CANNULA ARTERIAL VENT 3/8 20FR (CANNULA) ×1 IMPLANT
CANNULA EZ GLIDE AORTIC 21FR (CANNULA) ×8 IMPLANT
CANNULA FEM VENOUS REMOTE 22FR (CANNULA) ×1 IMPLANT
CANNULA GUNDRY RCSP 15FR (MISCELLANEOUS) ×6 IMPLANT
CANNULA SOFTFLOW AORTIC 7M21FR (CANNULA) ×4 IMPLANT
CANNULA SUMP PERICARDIAL (CANNULA) ×1 IMPLANT
CANNULA VENNOUS METAL TIP 20FR (CANNULA) ×1 IMPLANT
CATH CPB KIT OWEN (MISCELLANEOUS) ×4 IMPLANT
CATH HEART VENT LEFT (CATHETERS) ×3 IMPLANT
CATH THORACIC 36FR (CATHETERS) ×4 IMPLANT
CATH THORACIC 36FR RT ANG (CATHETERS) ×4 IMPLANT
CAUTERY SURG HI TEMP FINE TIP (MISCELLANEOUS) ×1 IMPLANT
CHORD X CHORDAL SIZER ×1 IMPLANT
CLIP APPLIE 9.375 SM OPEN (CLIP) IMPLANT
CLIP RETRACTION 3.0MM CORONARY (MISCELLANEOUS) ×1 IMPLANT
CLIP TI MEDIUM 24 (CLIP) IMPLANT
CLIP TI WIDE RED SMALL 24 (CLIP) IMPLANT
CONN 3/8X1/2 ST GISH (MISCELLANEOUS) ×2 IMPLANT
CONN ST 1/4X3/8  BEN (MISCELLANEOUS) ×3
CONN ST 1/4X3/8 BEN (MISCELLANEOUS) IMPLANT
CONT SPEC 4OZ CLIKSEAL STRL BL (MISCELLANEOUS) ×5 IMPLANT
COUNTER NEEDLE 20 DBL MAG RED (NEEDLE) ×3 IMPLANT
COVER SURGICAL LIGHT HANDLE (MISCELLANEOUS) ×3 IMPLANT
CRADLE DONUT ADULT HEAD (MISCELLANEOUS) ×4 IMPLANT
DERMABOND ADVANCED (GAUZE/BANDAGES/DRESSINGS) ×1
DERMABOND ADVANCED .7 DNX12 (GAUZE/BANDAGES/DRESSINGS) IMPLANT
DEVICE SUT CK QUICK LOAD INDV (Prosthesis & Implant Heart) ×3 IMPLANT
DEVICE SUT CK QUICK LOAD MINI (Prosthesis & Implant Heart) ×5 IMPLANT
DRAIN CHANNEL 32F RND 10.7 FF (WOUND CARE) ×9 IMPLANT
DRAPE BILATERAL SPLIT (DRAPES) IMPLANT
DRAPE CARDIOVASCULAR INCISE (DRAPES) ×4
DRAPE CV SPLIT W-CLR ANES SCRN (DRAPES) IMPLANT
DRAPE INCISE IOBAN 66X45 STRL (DRAPES) ×8 IMPLANT
DRAPE SLUSH/WARMER DISC (DRAPES) ×4 IMPLANT
DRAPE SRG 135X102X78XABS (DRAPES) ×3 IMPLANT
DRSG COVADERM 4X14 (GAUZE/BANDAGES/DRESSINGS) ×4 IMPLANT
ELECT BLADE 4.0 EZ CLEAN MEGAD (MISCELLANEOUS) ×4
ELECT REM PT RETURN 9FT ADLT (ELECTROSURGICAL) ×8
ELECTRODE BLDE 4.0 EZ CLN MEGD (MISCELLANEOUS) ×3 IMPLANT
ELECTRODE REM PT RTRN 9FT ADLT (ELECTROSURGICAL) ×6 IMPLANT
FELT TEFLON 1X6 (MISCELLANEOUS) ×8 IMPLANT
GAUZE SPONGE 4X4 12PLY STRL (GAUZE/BANDAGES/DRESSINGS) ×6 IMPLANT
GLOVE BIO SURGEON STRL SZ 6 (GLOVE) ×1 IMPLANT
GLOVE BIO SURGEON STRL SZ 6.5 (GLOVE) ×11 IMPLANT
GLOVE BIO SURGEON STRL SZ7 (GLOVE) ×4 IMPLANT
GLOVE BIO SURGEON STRL SZ7.5 (GLOVE) IMPLANT
GLOVE BIOGEL M 6.5 STRL (GLOVE) ×3 IMPLANT
GLOVE BIOGEL M 7.0 STRL (GLOVE) ×3 IMPLANT
GLOVE BIOGEL PI IND STRL 6 (GLOVE) IMPLANT
GLOVE BIOGEL PI IND STRL 6.5 (GLOVE) IMPLANT
GLOVE BIOGEL PI INDICATOR 6 (GLOVE) ×1
GLOVE BIOGEL PI INDICATOR 6.5 (GLOVE) ×3
GLOVE ORTHO TXT STRL SZ7.5 (GLOVE) ×12 IMPLANT
GOWN STRL REUS W/ TWL LRG LVL3 (GOWN DISPOSABLE) ×12 IMPLANT
GOWN STRL REUS W/TWL LRG LVL3 (GOWN DISPOSABLE) ×60
GRAFT WOVEN D/V 28DX30L (Vascular Products) ×1 IMPLANT
HEMOSTAT POWDER SURGIFOAM 1G (HEMOSTASIS) ×14 IMPLANT
HEMOSTAT SNOW SURGICEL 2X4 (HEMOSTASIS) ×1 IMPLANT
INSERT FOGARTY XLG (MISCELLANEOUS) ×4 IMPLANT
IV NS IRRIG 3000ML ARTHROMATIC (IV SOLUTION) ×1 IMPLANT
KIT BASIN OR (CUSTOM PROCEDURE TRAY) ×4 IMPLANT
KIT DILATOR VASC 18G NDL (KITS) ×1 IMPLANT
KIT DRAINAGE VACCUM ASSIST (KITS) ×1 IMPLANT
KIT ROOM TURNOVER OR (KITS) ×4 IMPLANT
KIT SUCTION CATH 14FR (SUCTIONS) ×12 IMPLANT
KIT SUT CK MINI COMBO 4X17 (Prosthesis & Implant Heart) ×1 IMPLANT
KIT VASOVIEW HEMOPRO VH 3000 (KITS) ×4 IMPLANT
LEAD PACING MYOCARDI (MISCELLANEOUS) ×4 IMPLANT
LINE VENT (MISCELLANEOUS) ×1 IMPLANT
LOOP VESSEL SUPERMAXI WHITE (MISCELLANEOUS) ×1 IMPLANT
MARKER GRAFT CORONARY BYPASS (MISCELLANEOUS) ×12 IMPLANT
NS IRRIG 1000ML POUR BTL (IV SOLUTION) ×23 IMPLANT
PACK OPEN HEART (CUSTOM PROCEDURE TRAY) ×4 IMPLANT
PAD ARMBOARD 7.5X6 YLW CONV (MISCELLANEOUS) ×8 IMPLANT
PAD CARDIAC INSULATION (MISCELLANEOUS) ×1 IMPLANT
PAD ELECT DEFIB RADIOL ZOLL (MISCELLANEOUS) ×4 IMPLANT
PENCIL BUTTON HOLSTER BLD 10FT (ELECTRODE) ×4 IMPLANT
PUNCH AORTIC ROTATE 4.0MM (MISCELLANEOUS) IMPLANT
PUNCH AORTIC ROTATE 4.5MM 8IN (MISCELLANEOUS) IMPLANT
PUNCH AORTIC ROTATE 5MM 8IN (MISCELLANEOUS) IMPLANT
RING ANNULOFLEX SZ 30 (Ring) ×1 IMPLANT
SEALANT PATCH FIBRIN 2X4IN (MISCELLANEOUS) ×1 IMPLANT
SEALANT SURG COSEAL 8ML (VASCULAR PRODUCTS) ×1 IMPLANT
SET CARDIOPLEGIA MPS 5001102 (MISCELLANEOUS) ×1 IMPLANT
SET IRRIG TUBING LAPAROSCOPIC (IRRIGATION / IRRIGATOR) ×4 IMPLANT
SOLUTION ANTI FOG 6CC (MISCELLANEOUS) ×1 IMPLANT
SPONGE ABDOMINAL VAC ABTHERA (MISCELLANEOUS) ×1 IMPLANT
SPONGE GAUZE 4X4 12PLY STER LF (GAUZE/BANDAGES/DRESSINGS) ×2 IMPLANT
SPONGE LAP 18X18 X RAY DECT (DISPOSABLE) ×3 IMPLANT
SPONGE LAP 4X18 X RAY DECT (DISPOSABLE) ×2 IMPLANT
SURGIFLO W/THROMBIN 8M KIT (HEMOSTASIS) ×1 IMPLANT
SUT BONE WAX W31G (SUTURE) ×5 IMPLANT
SUT ETHIBON 2 0 V 52N 30 (SUTURE) ×12 IMPLANT
SUT ETHIBON EXCEL 2-0 V-5 (SUTURE) IMPLANT
SUT ETHIBOND (SUTURE) ×2 IMPLANT
SUT ETHIBOND 2 0 SH (SUTURE) ×2 IMPLANT
SUT ETHIBOND 2 0 SH 36X2 (SUTURE) IMPLANT
SUT ETHIBOND 2 0 V4 (SUTURE) IMPLANT
SUT ETHIBOND 2 0V4 GREEN (SUTURE) IMPLANT
SUT ETHIBOND 2-0 RB-1 WHT (SUTURE) ×2 IMPLANT
SUT ETHIBOND 4 0 RB 1 (SUTURE) IMPLANT
SUT ETHIBOND NAB MH 2-0 36IN (SUTURE) ×3 IMPLANT
SUT ETHIBOND V-5 VALVE (SUTURE) IMPLANT
SUT ETHIBOND X763 2 0 SH 1 (SUTURE) ×15 IMPLANT
SUT MNCRL AB 3-0 PS2 18 (SUTURE) ×8 IMPLANT
SUT MNCRL AB 4-0 PS2 18 (SUTURE) ×1 IMPLANT
SUT PDS AB 1 CTX 36 (SUTURE) ×8 IMPLANT
SUT PROLENE 2 0 MH 48 (SUTURE) ×3 IMPLANT
SUT PROLENE 2 0 SH DA (SUTURE) IMPLANT
SUT PROLENE 3 0 SH DA (SUTURE) ×13 IMPLANT
SUT PROLENE 3 0 SH1 36 (SUTURE) ×13 IMPLANT
SUT PROLENE 4 0 RB 1 (SUTURE) ×36
SUT PROLENE 4 0 SH DA (SUTURE) ×10 IMPLANT
SUT PROLENE 4-0 RB1 .5 CRCL 36 (SUTURE) ×6 IMPLANT
SUT PROLENE 5 0 C 1 36 (SUTURE) IMPLANT
SUT PROLENE 6 0 C 1 30 (SUTURE) ×6 IMPLANT
SUT PROLENE 7.0 RB 3 (SUTURE) ×12 IMPLANT
SUT PROLENE 8 0 BV175 6 (SUTURE) ×8 IMPLANT
SUT PROLENE BLUE 7 0 (SUTURE) ×4 IMPLANT
SUT PROLENE POLY MONO (SUTURE) IMPLANT
SUT PTFE CHORD X 24MM (SUTURE) ×1 IMPLANT
SUT PTFE CHORD X SYSTEM (SUTURE) ×1 IMPLANT
SUT SILK  1 MH (SUTURE) ×5
SUT SILK 1 MH (SUTURE) ×3 IMPLANT
SUT SILK 2 0 SH CR/8 (SUTURE) IMPLANT
SUT SILK 3 0 SH CR/8 (SUTURE) IMPLANT
SUT STEEL 6MS V (SUTURE) ×1 IMPLANT
SUT STEEL STERNAL CCS#1 18IN (SUTURE) IMPLANT
SUT STEEL SZ 6 DBL 3X14 BALL (SUTURE) ×2 IMPLANT
SUT VIC AB 1 CTX 36 (SUTURE)
SUT VIC AB 1 CTX36XBRD ANBCTR (SUTURE) IMPLANT
SUT VIC AB 2-0 CT1 27 (SUTURE) ×4
SUT VIC AB 2-0 CT1 TAPERPNT 27 (SUTURE) IMPLANT
SUT VIC AB 2-0 CTX 27 (SUTURE) IMPLANT
SUT VIC AB 3-0 SH 27 (SUTURE) ×4
SUT VIC AB 3-0 SH 27X BRD (SUTURE) IMPLANT
SUT VIC AB 3-0 X1 27 (SUTURE) IMPLANT
SUT VICRYL 4-0 PS2 18IN ABS (SUTURE) IMPLANT
SUTURE E-PAK OPEN HEART (SUTURE) ×4 IMPLANT
SYRINGE 10CC LL (SYRINGE) ×1 IMPLANT
SYSTEM SAHARA CHEST DRAIN ATS (WOUND CARE) ×5 IMPLANT
TAPE CLOTH SURG 4X10 WHT LF (GAUZE/BANDAGES/DRESSINGS) ×2 IMPLANT
TAPE PAPER 2X10 WHT MICROPORE (GAUZE/BANDAGES/DRESSINGS) ×1 IMPLANT
TOWEL OR 17X24 6PK STRL BLUE (TOWEL DISPOSABLE) ×8 IMPLANT
TOWEL OR 17X26 10 PK STRL BLUE (TOWEL DISPOSABLE) ×8 IMPLANT
TRAY FOLEY IC TEMP SENS 16FR (CATHETERS) ×4 IMPLANT
TUBE CONNECTING 12X1/4 (SUCTIONS) ×1 IMPLANT
TUBE CONNECTING 20X1/4 (TUBING) ×1 IMPLANT
TUBING INSUFFLATION (TUBING) ×4 IMPLANT
UNDERPAD 30X30 (UNDERPADS AND DIAPERS) ×4 IMPLANT
VALVE MAGNA EASE AORTIC 25MM (Prosthesis & Implant Heart) ×1 IMPLANT
VENT LEFT HEART 12002 (CATHETERS) ×8
WATER STERILE IRR 1000ML POUR (IV SOLUTION) ×8 IMPLANT
WIRE J 3MM .035X145CM (WIRE) ×1 IMPLANT
YANKAUER SUCT BULB TIP NO VENT (SUCTIONS) ×2 IMPLANT

## 2016-10-22 NOTE — OR Nursing (Signed)
2011 First call made to SICU.

## 2016-10-22 NOTE — Op Note (Addendum)
CARDIOTHORACIC SURGERY OPERATIVE NOTE  Date of Procedure:  10/22/2016  Preoperative Diagnosis:   Bicuspid Aortic Valve with Severe Aortic Stenosis  Multivessel Coronary Artery Disease  Unstable Angina Pectoris  Ascending Thoracic Aortic Aneurysm  Postoperative Diagnosis:   Bicuspid Aortic Valve with Severe Aortic Stenosis  Multivessel Coronary Artery Disease  Unstable Angina Pectoris  Ascending Thoracic Aortic Aneurysm  Mitral Valve Prolapse with Moderate-Severe Mitral Regurgitation  Severe left ventricular hypertrophy with asymmetric septal hypertrophy   Procedure:    Aortic Valve Replacement  Edwards Magna Ease Pericardial Tissue Valve (size 79mm, model # 3300TFX, serial # [id number])  Septal myomectomy   Mitral Valve Repair  Complex valvuloplasty including artificial Gore-tex neochord placement x6  Sorin Carbomedics Annuloflex posterior annuloplasty band placed but subsequently removed due to systolic anterior motion   Repair of Ascending Thoracic Aortic Aneurysm  Supracoronary straight graft repair using 28 mm Hemashield Platinum woven double velour vascular graft   Coronary Artery Bypass Grafting x 3  Sequential Left Internal Mammary Artery to Diagonal Branch Coronary Artery and Distal Left Anterior Descending Coronary Artery  Saphenous Vein Graft to Distal Left Anterior Descending Coronary Artery  Open Vein Harvest from Right Thigh     Surgeon: Valentina Gu. Roxy Manns, MD  Assistant: Nani Skillern, PA-C  Anesthesia: Laurie Panda, MD and Suzette Battiest, MD  Operative Findings:  Bicuspid aortic valve with severe aortic stenosis and moderate aortic insufficiency  Fibroelastic deficiency type degenerative disease of mitral valve with ruptured chordae tendineae causing flail segment of posterior leaflet  Type II mitral valve dysfunction with moderate (3+) mitral regurgitation  Fusiform aneurysm of ascending thoracic aorta  Normal LV systolic  function  Severe LV hypertrophy with diastolic dysfunction  Asymmetric septal hypertrophy  Good quality LIMA conduit for grafting  Good quality SVG conduit for grafting  Severe systolic anterior motion (SAM) of mitral valve after initial valve repair that failed to resolve despite extensive maneuvers  Resolution of SAM but moderate (3+) residual mitral regurgitation after removal of posterior annuloplasty band                      BRIEF CLINICAL NOTE AND INDICATIONS FOR SURGERY  Patient is a 70 year old male with no previous history of coronary artery disease but known history of heart murmur and risk factors notable for history of type 2 diabetes mellitus, hyperlipidemia, and remote history of tobacco use who has been referred for surgical consultation to discuss management of severe symptomatic aortic stenosis and coronary artery disease with unstable angina pectoris. The patient states he was in his usual state of health until approximately 2 months ago when he first began to experience symptoms of exertional chest pain and shortness of breath. Symptoms initially seemed to happen when he bent over at the waist, but he rapidly noticed increasing symptoms of substernal chest pain and shortness of breath that were brought on with physical exertion and relieved by rest. Symptoms progressed in frequency and severity over the last 2 months. Symptoms are described as midsternal pain which the patient initially attributed to intense indigestion. He denies any prolonged episodes of chest pain and reports that symptoms are usually relieved within a few minutes of rest. He denies any associated symptoms of shortness of breath in the absence of ongoing chest discomfort. He denies any symptoms of chest pain occurring at rest. He denies any nocturnal angina. He denies any history of palpitations, dizzy spells, or syncope. He was referred for cardiology consultation and evaluated in the  office yesterday by Dr. Percival Spanish. Transthoracic echocardiogram revealed functionally bicuspid aortic valve with severe aortic stenosis. Left ventricular systolic function remained preserved. The patient was scheduled for diagnostic cardiac catheterization performed earlier today by Dr. Ellyn Hack. The patient was found to have severe aortic stenosis with severe multivessel coronary artery disease including critical 99% stenosis of the mid left anterior descending coronary artery. The patient was admitted to the hospital and urgent or thoracic surgical consultation requested.  The patient has been seen in consultation and counseled at length regarding the indications, risks and potential benefits of surgery.  All questions have been answered, and the patient provides full informed consent for the operation as described.  Subsequent CT angiogram of the chest reveals fusiform ascending thoracic aortic aneurysm w/out evidence of aortic dissection.  The indications for repair of the thoracic aortic aneurysm at the time of surgery are discussed.    DETAILS OF THE OPERATIVE PROCEDURE  Preparation:  The patient is brought to the operating room on the above mentioned date and central monitoring was established by the anesthesia team including placement of Swan-Ganz catheter and radial arterial line. The patient has moderate pulmonary hypertension at baseline with PA systolic pressures measured in the 60s. The patient is placed in the supine position on the operating table.  Intravenous antibiotics are administered. General endotracheal anesthesia is induced uneventfully. A Foley catheter is placed.  Baseline transesophageal echocardiogram was performed.  Findings were notable for there is severe aortic stenosis with moderate aortic insufficiency. There is severe left ventricular hypertrophy with significant diastolic dysfunction. There is normal left ventricular systolic function. There appears to be a single ruptured  chordae tendineae involving middle scallop of the posterior leaflet causing moderate to severe mitral regurgitation. There is no flow reversal in the pulmonary veins.  The patient's chest, abdomen, both groins, and both lower extremities are prepared and draped in a sterile manner. A time out procedure is performed.   Surgical Approach and Conduit Harvest:  A median sternotomy incision was performed and the left internal mammary artery is dissected from the chest wall and prepared for bypass grafting. The left internal mammary artery is notably good quality conduit.  The pericardium is opened. The ascending aorta is dilated in appearance.   The entire ascending aorta is dilated, measuring approximately 4.5 cm diameter in the midportion. It tapers down towards normal at the level of the takeoff of the innominate artery.   Extracorporeal Cardiopulmonary Bypass and Myocardial Protection:  The right common femoral vein is cannulated using the Seldinger technique and a guidewire advanced into the right atrium using TEE guidance.  The patient is heparinized systemically and the right common femoral vein cannulated using a 22 Fr long femoral venous cannula.  The innominate artery is cannulated for cardiopulmonary bypass.  Adequate heparinization is verified.   A retrograde cardioplegia cannula is placed through the right atrium into the coronary sinus.   The entire pre-bypass portion of the operation was notable for stable hemodynamics.  Cardiopulmonary bypass was begun and the surface of the heart is inspected.  A second venous cannula is placed directly into the superior vena cava.   A cardioplegia cannula is placed in the ascending aorta.  A temperature probe was placed in the interventricular septum.  The operative field is continuously flooded with carbon dioxide.  The patient is cooled to 32C systemic temperature.  The aortic cross clamp is applied and cold blood cardioplegia is delivered initially  in an antegrade fashion through the aortic root.  Supplemental cardioplegia is given retrograde through the coronary sinus catheter.  Iced saline slush is applied for topical hypothermia.  The initial cardioplegic arrest is rapid with early diastolic arrest.  Repeat doses of cardioplegia are administered intermittently throughout the entire cross clamp portion of the operation through the aortic root and through the coronary sinus catheter in order to maintain completely flat electrocardiogram and septal myocardial temperature below 15C.  Myocardial protection was felt to be excellent.   Mitral Valve Repair:  A left atriotomy incision was performed through the interatrial groove and extended partially across the back wall of the left atrium after opening the oblique sinus inferiorly.  The mitral valve is exposed using a self-retaining retractor.  Attempts to expose the mitral valve are poor because of the severely enlarged, dilated, and calcified aortic valve and aortic root with surrounding severe left ventricular hypertrophy. A decision is made to proceed with excision of the native aortic valve prior to mitral valve repair in an effort to improve exposure.  A transverse aortotomy incision was performed approximately 1 cm below the aortic cross-clamp. The entire ascending thoracic aortic aneurysm is excised sharply to the level of the sinotubular junction.  The sinotubular junction measures approximately 2.8 cm.  For this reason supra-coronary straight graft replacement of the ascending thoracic aorta is chosen rather than Bentall aortic root replacement. The aortic valve was inspected and notable for congenitally bicuspid valve with fusion of the left and right cusps. There was severe aortic stenosis.  The aortic valve leaflets were excised sharply and the aortic annulus decalcified.  Decalcification was notably tedious due to the presence of extensive calcification which was very thick in the annulus  beneath the noncoronary leaflet, extending into the inter-valvular fibrosa.  Once the entire aortic root was debrided, the root was irrigated with saline solution and sized to accept a 25 mm stented bioprosthetic tissue valve.  Attention was now redirected to the mitral valve which is again exposed using a self-retaining retractor. At this point exposure is much improved due to previous excision of the aortic valve and debridement of the aortic annulus. The mitral valve was inspected and notable for fibroelastic deficiency type degenerative disease with single ruptured primary chordae tendoneae to the P2 segment of the posterior leaflet.  The single ruptured cord was from the anterior papillary muscle. The overall size of the valve appeared normal and there was only mild calcification in the posterior annulus. There was calcification near the commissures.  Interrupted 2-0 Ethibond horizontal mattress sutures were placed circumferentially around the mitral annulus.  These sutures will ultimately be utilized for ring annuloplasty, and at this juncture they are utilized to suspend the valve symmetrically.  A decision is made to use a posterior annuloplasty band rather than a complete ring because of the need for concomitant aortic valve replacement and concerns regarding the possibility of creating systolic anterior motion given the presence of severe left ventricular hypertrophy and asymmetric septal hypertrophy. Therefore, annuloplasty sutures were not placed across the base of the anterior leaflet.  Artificial neochord placement was performed using Chord-X multi-strand CV-4 Goretex pre-measured loops.  The appropriate cord length was measured from corresponding normal length primary cords from the P3 segment of the posterior leaflet. The papillary muscle suture of the Chord-X multi-strand suture was placed through the head of the anterior papillary muscle in a horizontal mattress fashion and tied over Teflon  felt pledgets. Each of the three pre-measured loops were then reimplanted into the free margin of the  P2 segment of the posterior leaflet.    The valve was tested with saline and appeared competent even without ring annuloplasty complete. The valve was sized to a 30 mm annuloplasty ring, based upon the transverse distance between the left and right commissures and the height of the anterior leaflet, corresponding to a size just slightly larger than the overall surface area of the anterior leaflet.  A Sorin Carbomedics Annuloflex annuloplasty ring (size 36mm, catalog #[id number], serial [id number]) was secured in place uneventfully. The anterior portion of the ring was excised to leave a posterior annuloplasty band. The valve was tested with saline and appeared competent.   The valve is again tested with saline and appears to be perfectly competent with a broad symmetrical line of coaptation of the anterior and posterior leaflet. There is no residual leak.  The atriotomy was closed using a 2-layer closure of running 3-0 Prolene suture after placing a sump drain across the mitral valve to serve as a left ventricular vent.     Aortic Valve Replacement:  Attention was now redirected to the aortic valve. The aortic annulus was again sized to accept a 25 mm prosthesis.  The aortic root and left ventricle were irrigated with copious cold saline solution.  Because of the severe asymmetric septal hypertrophy noted in the left ventricular outflow tract, a small septal myectomy is performed using an 11 blade knife.  Aortic valve replacement was performed using interrupted horizontal mattress 2-0 Ethibond pledgeted sutures with pledgets in the subannular position.  An [facility] Ease pericardial tissue valve (size 25 mm, model # 3300TFX, serial # [id number]) was implanted uneventfully. The valve seated appropriately with adequate space beneath the left main and right coronary artery.   Repair of Ascending Thoracic  Aortic Aneurysm:  The ascending thoracic aortic aneurysm was repaired using supra-coronary straight graft replacement of the entire ascending thoracic aorta with a 28 mm Hemashield platinum woven double-velour vascular graft. The proximal anastomosis is performed and to and to the sinotubular junction using interrupted 2-0 Ethibond horizontal mattress pledgeted sutures. The distal end of the graft is trimmed and beveled to an appropriate length and the distal anastomosis is performed using running 3-0 Prolene suture.   Coronary Artery Bypass Grafting:   The diagonal branch of the left anterior descending coronary artery was grafted using the left internal mammary artery in side-to-side fashion.  At the site of distal anastomosis the target vessel was good quality and measured approximately 2.0 mm in diameter.  The distal left anterior coronary artery was grafted with the left internal mammary artery as a sequential graft in an end-to-side fashion.  At the site of distal anastomosis the target vessel was fair quality and measured approximately 1.5 mm in diameter.  The left ventricular septal myocardial temperature rose rapidly with reperfusion of the left internal mammary artery graft. One final dose of warm retrograde "hot shot" cardioplegia was administered retrograde through the coronary sinus catheter while all air was evacuated through the aortic root.  The aortic cross clamp was removed after a cross clamp time of 223 minutes.  The aortic graft is carefully inspected for meticulous hemostasis. The left internal mammary artery graft inspected for appropriate graft orientation and hemostasis. Severe bleeding develops from large epicardial veins between the 2 distal anastomosis. This requires placement of several pledgeted sutures, and further tearing of the myocardial veins ensues. Further pledgeted sutures are placed, but concern arises regarding the likelihood to compromise the orientation of the  distal segment of the  left internal mammary artery graft to the distal left anterior descending coronary artery.  A segment of greater saphenous vein is obtained from the patient's right thigh using open vein harvest technique. The saphenous vein is notably good quality. A cardioplegia cannula is placed in the ascending aortic graft. The aortic cross clamp is replaced and cold blood cardioplegia delivered antegrade through the aortic root. A bulldog clamp was reapplied to the left internal mammary artery pedicle.  With the heart completely arrested several additional pledgeted sutures are placed to control the bleeding from the epicardial veins.    The distal left anterior descending coronary artery is grafted again further towards the apex using a reversed greater saphenous vein graft in end to side fashion. The proximal end of the vein graft is sewn directly to the ascending aortic graft.   The aortic cross clamp is removed after an additional cross-clamp time of 47 minutes.  Epicardial pacing wires are fixed to the right ventricular outflow tract and to the right atrial appendage. The patient is rewarmed to 37C temperature. The left ventricular vent is removed.  The patient is weaned and disconnected from cardiopulmonary bypass.  The patient's rhythm at separation from bypass was AV paced.  The patient was weaned from cardiopulmonary bypass on milrinone and low dose levophed infusions   Revision of Mitral Valve Repair:  Immediately after separation from bypass the patient developed elevated pulmonary artery pressures and hypotension. Transesophageal echocardiogram demonstrates hyperdynamic left ventricular function with underfilling of the left ventricle and systolic anterior motion of the mitral valve with severe mitral regurgitation. Bypass is resumed. Pacemaker heart rate is decreased to 60 bpm. Levophed infusion is stopped and Neo-Synephrine is used for afterload.  A second attempt to wean from  bypass is performed with care to try to keep the patient's left ventricle is full as possible. The patient again develops systolic anterior motion of the mitral valve associated with severe hypotension and elevated pulmonary artery pressures. The patient is rested for several minutes in a third attempt is performed. Despite utilization of all known measures including extreme volume loading, the patient develops systolic anterior motion with severe mitral regurgitation.  A retrograde cardioplegia cannula is replaced through the right atrium into the coronary sinus. An antegrade cardioplegia cannula is placed directly into the ascending aortic graft. A bulldog clamp is applied to the left internal mammary artery pedicle. The aortic cross clamp is replaced and cold blood cardioplegia is administered to the aortic root.  Supplemental cardioplegia is administered retrograde to the coronary sinus catheter.  The left atriotomy incision is reopened.  Attempts are made to expose the mitral valve using a self-retaining retractor. Because of the patient's severe left ventricular hypertrophy and aortic valve prosthesis with ascending aortic graft, exposure is nearly impossible. The valve is inspected. It is felt that because exposure so suboptimal, attempted mitral valve replacement would be fraught with extreme danger due to inability to see adequately to place sutures circumferentially. Other options are contemplated including placement of Alfieri stitch with or without removal of the posterior annuloplasty band.  A decision is made to remove the majority of the annuloplasty band. Once this is been done the valve is inspected and appears competent on saline injection because of the presence of the Gore-Tex neo-cords. A decision is made to leave the repair without annuloplasty band. A left ventricular vent is placed across the valve and the atriotomy incision closed. The aortic cross clamp is removed after a cross-clamp  time of 45  minutes.  The patient is rewarmed to 37C temperature. The left ventricular vent is removed.  The patient is weaned and disconnected from cardiopulmonary bypass.  The patient's rhythm at separation from bypass was AV paced.  The patient was weaned from cardiopulmonary bypass on milrinone and low dose levophed infusions.  Total cardiopulmonary bypass time for the operation was 477 minutes.   Procedure Completion:  Followup transesophageal echocardiogram performed after separation from bypass revealed normal left ventricular systolic function. There was no longer any residual systolic anterior motion of the mitral valve. The mitral valve was moving normally. There was moderate to severe residual mitral regurgitation with a jet directed eccentrically in anterior direction. There was no flow reversal in the pulmonary veins. The aortic valve was functioning normally. No other abnormalities were noted.  Pulmonary artery pressures were notably lower than they were preoperatively.  At this juncture a decision is made not to attempt any further repairs to the mitral valve because of the extreme long duration of the operation and inability to expose the valve satisfactorily. The patient is monitored closely for an extended period of time in the operating room and remains relatively stable from hemodynamic standpoint.  The lungs and the heart are notably severely swollen. Inhaled nitric oxide is begun.  Low-dose vasopressin infusion is added.  The innominate artery and superior vena cava cannula were removed uneventfully. Protamine was administered to reverse the anticoagulation. The femoral venous cannula was removed and manual pressure held on the groin for 30 minutes.    There is severe coagulopathy.  The patient received a total of 4 packs adult platelets, 6 units fresh frozen plasma, and 2 units cryoprecipitate due to coagulopathy and thrombocytopenia after separation from cardiopulmonary bypass  and reversal of heparin with protamine.  Immediately after separation from cardiopulmonary bypass the oxygenator on the heart lung machine was changed due to the presence of extensive clot within it.  An exhaustive search for sites of mechanical bleeding is performed. Some additional venous bleeding is noted from the anterior surface of the left ventricle. This is corrected with additional pledgeted sutures. Bleeding from the cannulation site in the innominate artery is also corrected using horizontal mattress pledgeted sutures. Ultimately hemostasis appears satisfactory.   The mediastinum and pleural space were inspected for hemostasis and irrigated with saline solution. The mediastinum and both pleural spaces were drained using 4 chest tubes placed through separate stab incisions inferiorly.   Because of severe swelling and elevated airway pressures, attempts to close the sternum were felt unwise.  The sternotomy is left open and covered using a sterile Esmark dressing sewn circumferentially around the skin edge.  A total of 2 laparotomy pads are left inside the wound as packing. A wound VAC is applied to cover the Esmark.   Disposition:  The patient was transported to the surgical intensive care unit in stable but critical condition. All sponge instrument and needle counts are verified correct at completion of the operation.    Valentina Gu. Roxy Manns MD 10/22/2016 9:24 PM

## 2016-10-22 NOTE — Anesthesia Procedure Notes (Signed)
Procedure Name: Intubation Date/Time: 10/22/2016 7:54 AM Performed by: Salli Quarry Coye Dawood Pre-anesthesia Checklist: Patient identified, Emergency Drugs available, Suction available and Patient being monitored Patient Re-evaluated:Patient Re-evaluated prior to inductionOxygen Delivery Method: Circle System Utilized Preoxygenation: Pre-oxygenation with 100% oxygen Intubation Type: IV induction Ventilation: Mask ventilation without difficulty Laryngoscope Size: Mac and 3 Grade View: Grade III Tube type: Oral Number of attempts: 1 Airway Equipment and Method: Stylet and Oral airway Placement Confirmation: ETT inserted through vocal cords under direct vision,  positive ETCO2 and breath sounds checked- equal and bilateral Secured at: 23 cm Tube secured with: Tape Dental Injury: Teeth and Oropharynx as per pre-operative assessment  Comments: Intubation performed by Lars Mage

## 2016-10-22 NOTE — Anesthesia Procedure Notes (Addendum)
Central Venous Catheter Insertion Performed by: anesthesiologist 10/22/2016 6:45 AM Preanesthetic checklist: patient identified, IV checked, risks and benefits discussed, surgical consent, monitors and equipment checked, pre-op evaluation, timeout performed and anesthesia consent Position: Trendelenburg Lidocaine 1% used for infiltration Landmarks identified and Seldinger technique used Catheter size: 8.5 Fr Central line and PA cath was placed.MAC introducer Swan type and PA catheter depth:thermodilutionProcedure performed using ultrasound guided technique. Attempts: 1 Following insertion, line sutured and dressing applied. Post procedure assessment: blood return through all ports, free fluid flow and no air. Patient tolerated the procedure well with no immediate complications.

## 2016-10-22 NOTE — Brief Op Note (Addendum)
10/20/2016 - 10/22/2016  4:37 PM  PATIENT:  Scott [name]  70 y.o. male  PRE-OPERATIVE DIAGNOSIS:  1.Coronary artery disease 2.SEVERE AS secondary to bicuspid valve  3. THORACIC AORTA ANEURYSM  POST-OPERATIVE DIAGNOSIS:  1.Coronary artery disease 2.SEVERE AS secondary to bicuspid valve  3. THORACIC AORTA ANEURYSM 4. MODERATE MR   PROCEDURE:  TRANSESOPHAGEAL ECHOCARDIOGRAM (TEE),MEDIAN STERNOTOMY for CORONARY ARTERY BYPASS GRAFTING (CABG)x 2 WITH LIMA TO DIAGONAL, SVG to LAD using OPEN  HARVESTING OF RIGHT GREATER SAPHENOUS THIGH VEIN FOR VEIN, AORTIC VALVE REPLACEMENT (AVR) using a SIZE 25 MM MAGNA EASE PERICARDIAL BIOPROSTHESIS,  MITRAL VALVE REPAIR (MVR) WITH SIZE 30 SORIN ANNULOFLEX ANNULOPLASTY RING, ASCENDING AORTIC  ANEURYSM REPAIR  WITH 28 MM HEMASHIELD PLATINUM WOVEN DOUBLE VELOUR VASCULAR GRAFT  SURGEON:    Rexene Alberts, MD  ASSISTANTS:  Nani Skillern, PA-C  ANESTHESIA:   Laurie Panda, MD and Suzette Battiest, MD  CROSSCLAMP TIME:   40'  CARDIOPULMONARY BYPASS TIME: 477'  FINDINGS:  Bicuspid aortic valve with severe aortic stenosis and moderate aortic insufficiency  Fibroelastic deficiency type degenerative disease of mitral valve with ruptured chordae tendineae causing flail segment of posterior leaflet  Type II mitral valve dysfunction with moderate (3+) mitral regurgitation  Fusiform aneurysm of ascending thoracic aorta  Normal LV systolic function  Severe LV hypertrophy with diastolic dysfunction  Asymmetric septal hypertrophy  Good quality LIMA conduit for grafting  Good quality SVG conduit for grafting  Severe systolic anterior motion (SAM) of mitral valve after initial valve repair that failed to resolve despite extensive maneuvers  Resolution of SAM but moderate-severe (3+) residual mitral regurgitation after removal of posterior annuloplasty band  COMPLICATIONS: None  BASELINE WEIGHT: 95 kg  PATIENT DISPOSITION:   TO SICU IN  STABLE CONDITION  Rexene Alberts, MD 10/22/2016 9:17 PM

## 2016-10-22 NOTE — Progress Notes (Signed)
[facility] BRIEF SICU PROGRESS NOTE  Day of Surgery  S/P Procedure(s) (LRB): CORONARY ARTERY BYPASS GRAFTING (CABG)x 2 WITH LIMA TO DIAGONAL, OPEN  HARVESTING OF RIGHT SAPHENOUS VEIN FOR VEIN GRAFT TO LAD (N/A) AORTIC VALVE REPLACEMENT (AVR) WITH SIZE 25 MM MAGNA EASE PERICARDIAL BIOPROSTHESIS - AORTIC (N/A) TRANSESOPHAGEAL ECHOCARDIOGRAM (TEE) (N/A) MITRAL VALVE REPAIR (MVR) WITH SIZE 30 SORIN ANNULOFLEX ANNULOPLASTY RING WITH SUBSEQUENT REMOVAL OF RING (N/A) ASCENDING AORTIC  ANEURYSM REPAIR (AAA) WITH 28 MM HEMASHIELD PLATINUM WOVEN DOUBLE VELOUR VASCULAR GRAFT   Sedated on vent NSR w/ stable hemodynamics, PA pressures relatively low O2 sats 100% and CXR looks clear Chest tube output low Adequate UOP Labs okay  Plan: Wean nitric oxide to 30 ppm and wean FiO2 as tolerated.  Keep sedated on vent for now.  Rexene Alberts, MD 10/22/2016 11:26 PM

## 2016-10-22 NOTE — Anesthesia Postprocedure Evaluation (Signed)
Anesthesia Post Note  Patient: Scott [name]  Procedure(s) Performed: Procedure(s) (LRB): CORONARY ARTERY BYPASS GRAFTING (CABG)x 2 WITH LIMA TO DIAGONAL, OPEN  HARVESTING OF RIGHT SAPHENOUS VEIN FOR VEIN GRAFT TO LAD (N/A) AORTIC VALVE REPLACEMENT (AVR) WITH SIZE 25 MM MAGNA EASE PERICARDIAL BIOPROSTHESIS - AORTIC (N/A) TRANSESOPHAGEAL ECHOCARDIOGRAM (TEE) (N/A) MITRAL VALVE REPAIR (MVR) WITH SIZE 30 SORIN ANNULOFLEX ANNULOPLASTY RING WITH SUBSEQUENT REMOVAL OF RING (N/A) ASCENDING AORTIC  ANEURYSM REPAIR (AAA) WITH 28 MM HEMASHIELD PLATINUM WOVEN DOUBLE VELOUR VASCULAR GRAFT  Patient location during evaluation: SICU Anesthesia Type: General Level of consciousness: sedated Pain management: pain level controlled Vital Signs Assessment: post-procedure vital signs reviewed and stable Respiratory status: patient remains intubated per anesthesia plan Cardiovascular status: stable Anesthetic complications: no    Last Vitals:  Vitals:   10/22/16 0518 10/22/16 2159  BP: 117/65   Pulse: (!) 59 77  Resp: 15 15  Temp: 36.6 C 36.5 C    Last Pain:  Vitals:   10/22/16 0518  TempSrc: Oral                 Tiajuana Amass

## 2016-10-22 NOTE — OR Nursing (Signed)
2034 Second call made to SICU.

## 2016-10-22 NOTE — Progress Notes (Signed)
  Echocardiogram 2D Echocardiogram has been performed.  Scott [name] 10/22/2016, 8:57 AM

## 2016-10-22 NOTE — OR Nursing (Signed)
Two 18 x 18 laps left in chest.

## 2016-10-22 NOTE — Transfer of Care (Signed)
Immediate Anesthesia Transfer of Care Note  Patient: Scott [name]  Procedure(s) Performed: Procedure(s): CORONARY ARTERY BYPASS GRAFTING (CABG)x 2 WITH LIMA TO DIAGONAL, OPEN  HARVESTING OF RIGHT SAPHENOUS VEIN FOR VEIN GRAFT TO LAD (N/A) AORTIC VALVE REPLACEMENT (AVR) WITH SIZE 25 MM MAGNA EASE PERICARDIAL BIOPROSTHESIS - AORTIC (N/A) TRANSESOPHAGEAL ECHOCARDIOGRAM (TEE) (N/A) MITRAL VALVE REPAIR (MVR) WITH SIZE 30 SORIN ANNULOFLEX ANNULOPLASTY RING WITH SUBSEQUENT REMOVAL OF RING (N/A) ASCENDING AORTIC  ANEURYSM REPAIR (AAA) WITH 28 MM HEMASHIELD PLATINUM WOVEN DOUBLE VELOUR VASCULAR GRAFT  Patient Location: PACU  Anesthesia Type:General  Level of Consciousness: Patient remains intubated per anesthesia plan  Airway & Oxygen Therapy: Patient remains intubated per anesthesia plan and Patient placed on Ventilator (see vital sign flow sheet for setting)  Post-op Assessment: Report given to RN and Post -op Vital signs reviewed and stable  Post vital signs: Reviewed and stable  Last Vitals:  Vitals:   10/21/16 2021 10/22/16 0518  BP: (!) 132/57 117/65  Pulse:  (!) 59  Resp: 15 15  Temp:  36.6 C    Last Pain:  Vitals:   10/22/16 0518  TempSrc: Oral         Complications: No apparent anesthesia complications

## 2016-10-22 NOTE — OR Nursing (Signed)
2125 Rolling call made to SICU, RN.

## 2016-10-22 NOTE — Anesthesia Preprocedure Evaluation (Signed)
Anesthesia Evaluation  Patient identified by MRN, date of birth, ID band Patient awake    Reviewed: Allergy & Precautions, NPO status , Patient's Chart, lab work & pertinent test results, reviewed documented beta blocker date and time   Airway Mallampati: II  TM Distance: >3 FB Neck ROM: Full    Dental  (+) Dental Advisory Given   Pulmonary neg shortness of breath, neg sleep apnea, neg COPD, neg recent URI, former smoker,    breath sounds clear to auscultation       Cardiovascular hypertension, Pt. on medications and Pt. on home beta blockers + angina + CAD and + Peripheral Vascular Disease  + Valvular Problems/Murmurs AS, AI and MR  Rhythm:Regular     Neuro/Psych  Neuromuscular disease negative psych ROS   GI/Hepatic Neg liver ROS, GERD  Medicated,  Endo/Other  diabetes, Type 2, Insulin Dependent  Renal/GU negative Renal ROS     Musculoskeletal  (+) Arthritis ,   Abdominal   Peds  Hematology negative hematology ROS (+)   Anesthesia Other Findings   Reproductive/Obstetrics                             Anesthesia Physical Anesthesia Plan  ASA: IV  Anesthesia Plan: General   Post-op Pain Management:    Induction: Intravenous  Airway Management Planned:   Additional Equipment: CVP, TEE, Arterial line, PA Cath and Ultrasound Guidance Line Placement  Intra-op Plan:   Post-operative Plan: Post-operative intubation/ventilation  Informed Consent: I have reviewed the patients History and Physical, chart, labs and discussed the procedure including the risks, benefits and alternatives for the proposed anesthesia with the patient or authorized representative who has indicated his/her understanding and acceptance.   Dental advisory given  Plan Discussed with: CRNA and Surgeon  Anesthesia Plan Comments:         Anesthesia Quick Evaluation

## 2016-10-23 ENCOUNTER — Inpatient Hospital Stay (HOSPITAL_COMMUNITY): Payer: Medicare Other

## 2016-10-23 DIAGNOSIS — J9601 Acute respiratory failure with hypoxia: Secondary | ICD-10-CM

## 2016-10-23 DIAGNOSIS — Z953 Presence of xenogenic heart valve: Secondary | ICD-10-CM

## 2016-10-23 DIAGNOSIS — J810 Acute pulmonary edema: Secondary | ICD-10-CM

## 2016-10-23 DIAGNOSIS — R570 Cardiogenic shock: Secondary | ICD-10-CM | POA: Diagnosis not present

## 2016-10-23 DIAGNOSIS — I340 Nonrheumatic mitral (valve) insufficiency: Secondary | ICD-10-CM

## 2016-10-23 LAB — PREPARE FRESH FROZEN PLASMA
UNIT DIVISION: 0
UNIT DIVISION: 0
UNIT DIVISION: 0
Unit division: 0
Unit division: 0
Unit division: 0
Unit division: 0
Unit division: 0

## 2016-10-23 LAB — POCT I-STAT 3, ART BLOOD GAS (G3+)
Acid-base deficit: 4 mmol/L — ABNORMAL HIGH (ref 0.0–2.0)
Acid-base deficit: 6 mmol/L — ABNORMAL HIGH (ref 0.0–2.0)
BICARBONATE: 18.9 mmol/L — AB (ref 20.0–28.0)
Bicarbonate: 18.8 mmol/L — ABNORMAL LOW (ref 20.0–28.0)
O2 SAT: 98 %
O2 Saturation: 100 %
PCO2 ART: 27.2 mmHg — AB (ref 32.0–48.0)
PCO2 ART: 36.3 mmHg (ref 32.0–48.0)
PH ART: 7.328 — AB (ref 7.350–7.450)
Patient temperature: 100.2
TCO2: 20 mmol/L (ref 0–100)
TCO2: 20 mmol/L (ref 0–100)
pH, Arterial: 7.453 — ABNORMAL HIGH (ref 7.350–7.450)
pO2, Arterial: 173 mmHg — ABNORMAL HIGH (ref 83.0–108.0)
pO2, Arterial: 124 mmHg — ABNORMAL HIGH (ref 83.0–108.0)

## 2016-10-23 LAB — GLUCOSE, CAPILLARY
GLUCOSE-CAPILLARY: 101 mg/dL — AB (ref 65–99)
GLUCOSE-CAPILLARY: 103 mg/dL — AB (ref 65–99)
GLUCOSE-CAPILLARY: 104 mg/dL — AB (ref 65–99)
GLUCOSE-CAPILLARY: 110 mg/dL — AB (ref 65–99)
GLUCOSE-CAPILLARY: 114 mg/dL — AB (ref 65–99)
GLUCOSE-CAPILLARY: 130 mg/dL — AB (ref 65–99)
GLUCOSE-CAPILLARY: 132 mg/dL — AB (ref 65–99)
GLUCOSE-CAPILLARY: 135 mg/dL — AB (ref 65–99)
GLUCOSE-CAPILLARY: 136 mg/dL — AB (ref 65–99)
GLUCOSE-CAPILLARY: 149 mg/dL — AB (ref 65–99)
GLUCOSE-CAPILLARY: 151 mg/dL — AB (ref 65–99)
GLUCOSE-CAPILLARY: 85 mg/dL (ref 65–99)
Glucose-Capillary: 106 mg/dL — ABNORMAL HIGH (ref 65–99)
Glucose-Capillary: 106 mg/dL — ABNORMAL HIGH (ref 65–99)
Glucose-Capillary: 113 mg/dL — ABNORMAL HIGH (ref 65–99)
Glucose-Capillary: 114 mg/dL — ABNORMAL HIGH (ref 65–99)
Glucose-Capillary: 119 mg/dL — ABNORMAL HIGH (ref 65–99)
Glucose-Capillary: 128 mg/dL — ABNORMAL HIGH (ref 65–99)
Glucose-Capillary: 129 mg/dL — ABNORMAL HIGH (ref 65–99)
Glucose-Capillary: 130 mg/dL — ABNORMAL HIGH (ref 65–99)
Glucose-Capillary: 147 mg/dL — ABNORMAL HIGH (ref 65–99)
Glucose-Capillary: 149 mg/dL — ABNORMAL HIGH (ref 65–99)
Glucose-Capillary: 90 mg/dL (ref 65–99)

## 2016-10-23 LAB — CBC
HCT: 32 % — ABNORMAL LOW (ref 39.0–52.0)
HEMATOCRIT: 29.6 % — AB (ref 39.0–52.0)
HEMOGLOBIN: 10.3 g/dL — AB (ref 13.0–17.0)
Hemoglobin: 11.2 g/dL — ABNORMAL LOW (ref 13.0–17.0)
MCH: 29.2 pg (ref 26.0–34.0)
MCH: 29.2 pg (ref 26.0–34.0)
MCHC: 35 g/dL (ref 30.0–36.0)
MCHC: 34.8 g/dL (ref 30.0–36.0)
MCV: 83.3 fL (ref 78.0–100.0)
MCV: 83.9 fL (ref 78.0–100.0)
PLATELETS: 119 10*3/uL — AB (ref 150–400)
Platelets: 87 10*3/uL — ABNORMAL LOW (ref 150–400)
RBC: 3.84 MIL/uL — ABNORMAL LOW (ref 4.22–5.81)
RBC: 3.53 MIL/uL — ABNORMAL LOW (ref 4.22–5.81)
RDW: 14.7 % (ref 11.5–15.5)
RDW: 14.7 % (ref 11.5–15.5)
WBC: 9.6 10*3/uL (ref 4.0–10.5)
WBC: 8.1 10*3/uL (ref 4.0–10.5)

## 2016-10-23 LAB — PREPARE PLATELET PHERESIS
UNIT DIVISION: 0
UNIT DIVISION: 0
Unit division: 0
Unit division: 0
Unit division: 0
Unit division: 0

## 2016-10-23 LAB — PREPARE CRYOPRECIPITATE
UNIT DIVISION: 0
Unit division: 0

## 2016-10-23 LAB — BASIC METABOLIC PANEL
Anion gap: 9 (ref 5–15)
BUN: 12 mg/dL (ref 6–20)
CALCIUM: 8.5 mg/dL — AB (ref 8.9–10.3)
CO2: 18 mmol/L — AB (ref 22–32)
CREATININE: 1.01 mg/dL (ref 0.61–1.24)
Chloride: 114 mmol/L — ABNORMAL HIGH (ref 101–111)
GFR calc Af Amer: >60 mL/min (ref >60)
GLUCOSE: 142 mg/dL — AB (ref 65–99)
Potassium: 4.2 mmol/L (ref 3.5–5.1)
Sodium: 141 mmol/L (ref 135–145)

## 2016-10-23 LAB — POCT I-STAT, CHEM 8
BUN: 14 mg/dL (ref 6–20)
CHLORIDE: 110 mmol/L (ref 101–111)
CREATININE: 0.9 mg/dL (ref 0.61–1.24)
Calcium, Ion: 1.28 mmol/L (ref 1.15–1.40)
GLUCOSE: 97 mg/dL (ref 65–99)
HEMATOCRIT: 26 % — AB (ref 39.0–52.0)
Hemoglobin: 8.8 g/dL — ABNORMAL LOW (ref 13.0–17.0)
POTASSIUM: 4.2 mmol/L (ref 3.5–5.1)
Sodium: 144 mmol/L (ref 135–145)
TCO2: 22 mmol/L (ref 0–100)

## 2016-10-23 LAB — MAGNESIUM
MAGNESIUM: 2 mg/dL (ref 1.7–2.4)
Magnesium: 2.5 mg/dL — ABNORMAL HIGH (ref 1.7–2.4)

## 2016-10-23 LAB — COOXEMETRY PANEL
Carboxyhemoglobin: 0.9 % (ref 0.5–1.5)
Methemoglobin: 1.7 % — ABNORMAL HIGH (ref 0.0–1.5)
O2 SAT: 53.3 %
TOTAL HEMOGLOBIN: 9.6 g/dL — AB (ref 12.0–16.0)

## 2016-10-23 LAB — CREATININE, SERUM
Creatinine, Ser: 1.02 mg/dL (ref 0.61–1.24)
GFR calc Af Amer: >60 mL/min (ref >60)
GFR calc non Af Amer: >60 mL/min (ref >60)

## 2016-10-23 MED ORDER — FENTANYL 2500MCG IN NS 250ML (10MCG/ML) PREMIX INFUSION
25.0000 ug/h | INTRAVENOUS | Status: DC
  Administered 2016-10-23: 100 ug/h via INTRAVENOUS
  Filled 2016-10-23: qty 250

## 2016-10-23 MED ORDER — MILRINONE LACTATE IN DEXTROSE 20-5 MG/100ML-% IV SOLN
0.3000 ug/kg/min | INTRAVENOUS | Status: DC
  Administered 2016-10-23 – 2016-10-24 (×2): 0.25 ug/kg/min via INTRAVENOUS
  Administered 2016-10-25 – 2016-10-26 (×3): 0.3 ug/kg/min via INTRAVENOUS
  Filled 2016-10-23 (×6): qty 100

## 2016-10-23 MED ORDER — INSULIN DETEMIR 100 UNIT/ML ~~LOC~~ SOLN
20.0000 [IU] | Freq: Two times a day (BID) | SUBCUTANEOUS | Status: DC
  Administered 2016-10-23 – 2016-10-25 (×5): 20 [IU] via SUBCUTANEOUS
  Filled 2016-10-23 (×7): qty 0.2

## 2016-10-23 MED ORDER — DEXTROSE 5 % IV SOLN
0.0000 ug/min | INTRAVENOUS | Status: DC
  Administered 2016-10-23: 6 ug/min via INTRAVENOUS
  Filled 2016-10-23: qty 4

## 2016-10-23 MED ORDER — ORAL CARE MOUTH RINSE
15.0000 mL | OROMUCOSAL | Status: DC
  Administered 2016-10-23 (×5): 15 mL via OROMUCOSAL

## 2016-10-23 MED ORDER — DEXMEDETOMIDINE HCL IN NACL 400 MCG/100ML IV SOLN
0.4000 ug/kg/h | INTRAVENOUS | Status: DC
  Administered 2016-10-23 – 2016-10-26 (×15): 1.2 ug/kg/h via INTRAVENOUS
  Filled 2016-10-23 (×14): qty 100
  Filled 2016-10-23: qty 200
  Filled 2016-10-23 (×3): qty 100

## 2016-10-23 MED ORDER — FUROSEMIDE 10 MG/ML IJ SOLN
4.0000 mg/h | INTRAVENOUS | Status: DC
  Administered 2016-10-23: 4 mg/h via INTRAVENOUS
  Filled 2016-10-23: qty 25

## 2016-10-23 MED ORDER — INSULIN DETEMIR 100 UNIT/ML ~~LOC~~ SOLN
20.0000 [IU] | Freq: Two times a day (BID) | SUBCUTANEOUS | Status: DC
  Administered 2016-10-23: 20 [IU] via SUBCUTANEOUS
  Filled 2016-10-23 (×2): qty 0.2

## 2016-10-23 MED ORDER — DEXMEDETOMIDINE HCL IN NACL 400 MCG/100ML IV SOLN
0.4000 ug/kg/h | INTRAVENOUS | Status: DC
  Administered 2016-10-23: 1.2 ug/kg/h via INTRAVENOUS
  Administered 2016-10-23 (×2): 0.7 ug/kg/h via INTRAVENOUS
  Filled 2016-10-23 (×3): qty 100

## 2016-10-23 MED ORDER — ORAL CARE MOUTH RINSE: 15.0000 mL | OROMUCOSAL | Status: DC

## 2016-10-23 MED ORDER — CHLORHEXIDINE GLUCONATE 0.12% ORAL RINSE (MEDLINE KIT): 15.0000 mL | Freq: Two times a day (BID) | OROMUCOSAL | Status: DC

## 2016-10-23 MED ORDER — NOREPINEPHRINE BITARTRATE 1 MG/ML IV SOLN
0.0000 ug/min | INTRAVENOUS | Status: DC
  Administered 2016-10-23: 8 ug/min via INTRAVENOUS
  Administered 2016-10-25: 6 ug/min via INTRAVENOUS
  Filled 2016-10-23 (×2): qty 16

## 2016-10-23 MED ORDER — FENTANYL BOLUS VIA INFUSION
25.0000 ug | INTRAVENOUS | Status: DC | PRN
  Filled 2016-10-23: qty 25

## 2016-10-23 MED ORDER — NOREPINEPHRINE BITARTRATE 1 MG/ML IV SOLN
0.0000 ug/min | INTRAVENOUS | Status: DC
  Administered 2016-10-23: 11 ug/min via INTRAVENOUS
  Filled 2016-10-23: qty 16

## 2016-10-23 MED ORDER — DEXMEDETOMIDINE HCL IN NACL 200 MCG/50ML IV SOLN
0.0000 ug/kg/h | INTRAVENOUS | Status: DC
  Administered 2016-10-23: 1.2 ug/kg/h via INTRAVENOUS

## 2016-10-23 MED ORDER — CALCIUM CHLORIDE 10 % IV SOLN
1.0000 g | Freq: Once | INTRAVENOUS | Status: AC
  Administered 2016-10-23: 1 g via INTRAVENOUS

## 2016-10-23 MED ORDER — DEXMEDETOMIDINE HCL IN NACL 200 MCG/50ML IV SOLN
0.4000 ug/kg/h | INTRAVENOUS | Status: DC
  Administered 2016-10-23: 1.2 ug/kg/h via INTRAVENOUS
  Filled 2016-10-23: qty 50

## 2016-10-23 MED ORDER — CHLORHEXIDINE GLUCONATE 0.12% ORAL RINSE (MEDLINE KIT)
15.0000 mL | Freq: Two times a day (BID) | OROMUCOSAL | Status: DC
  Administered 2016-10-23 – 2016-10-26 (×6): 15 mL via OROMUCOSAL

## 2016-10-23 MED ORDER — ORAL CARE MOUTH RINSE
15.0000 mL | Freq: Four times a day (QID) | OROMUCOSAL | Status: DC
  Administered 2016-10-24 – 2016-10-26 (×10): 15 mL via OROMUCOSAL

## 2016-10-23 MED ORDER — CHLORHEXIDINE GLUCONATE 0.12% ORAL RINSE (MEDLINE KIT)
15.0000 mL | Freq: Two times a day (BID) | OROMUCOSAL | Status: DC
  Administered 2016-10-23 (×2): 15 mL via OROMUCOSAL

## 2016-10-23 MED ORDER — FUROSEMIDE 10 MG/ML IJ SOLN
8.0000 mg/h | INTRAVENOUS | Status: DC
  Administered 2016-10-24: 4 mg/h via INTRAVENOUS
  Administered 2016-10-25: 8 mg/h via INTRAVENOUS
  Administered 2016-10-25: 4 mg/h via INTRAVENOUS
  Filled 2016-10-23 (×5): qty 25

## 2016-10-23 MED ORDER — FENTANYL CITRATE (PF) 100 MCG/2ML IJ SOLN: 50.0000 ug | Freq: Once | INTRAMUSCULAR | Status: DC

## 2016-10-23 MED ORDER — FENTANYL 2500MCG IN NS 250ML (10MCG/ML) PREMIX INFUSION
25.0000 ug/h | INTRAVENOUS | Status: DC
  Administered 2016-10-24: 100 ug/h via INTRAVENOUS
  Administered 2016-10-24 – 2016-10-25 (×3): 150 ug/h via INTRAVENOUS
  Filled 2016-10-23 (×4): qty 250

## 2016-10-23 MED FILL — Lidocaine HCl IV Inj 20 MG/ML: INTRAVENOUS | Qty: 15 | Status: AC

## 2016-10-23 MED FILL — Sodium Chloride IV Soln 0.9%: INTRAVENOUS | Qty: 6000 | Status: AC

## 2016-10-23 MED FILL — Sodium Chloride IV Soln 0.9%: INTRAVENOUS | Qty: 7000 | Status: AC

## 2016-10-23 MED FILL — Electrolyte-R (PH 7.4) Solution: INTRAVENOUS | Qty: 6000 | Status: AC

## 2016-10-23 MED FILL — Albumin, Human Inj 5%: INTRAVENOUS | Qty: 500 | Status: AC

## 2016-10-23 MED FILL — Sodium Bicarbonate IV Soln 8.4%: INTRAVENOUS | Qty: 50 | Status: AC

## 2016-10-23 MED FILL — Calcium Chloride Inj 10%: INTRAVENOUS | Qty: 10 | Status: AC

## 2016-10-23 MED FILL — Heparin Sodium (Porcine) Inj 1000 Unit/ML: INTRAMUSCULAR | Qty: 90 | Status: AC

## 2016-10-23 MED FILL — Mannitol IV Soln 20%: INTRAVENOUS | Qty: 500 | Status: AC

## 2016-10-23 NOTE — Consult Note (Addendum)
Advanced Heart Failure Team Consult Note  Referring Physician: Dr Roxy Manns Primary Physician: Dr Quintin Alto Primary Cardiologist:  Dr Percival Spanish   Reason for Consultation: Heart Failure   HPI:   Scott [name] is a 70 year old with a PMH of Arthritis; Bicuspid aortic valve; BPH (benign prostatic hypertrophy); Carpal tunnel syndrome of right wrist; Coronary artery disease involving native coronary artery of native heart with unstable angina pectoris ([place]) (10/20/2016); Diverticulitis; GERD (gastroesophageal reflux disease); Gout; Heart murmur; Hyperlipemia; Hypertension; RLS (restless legs syndrome); S/P aortic valve replacement with bioprosthetic valve (10/22/2016); S/P ascending aortic aneurysm repair (10/22/2016); S/P CABG x 3 (10/22/2016); S/P mitral valve repair (10/22/2016); Severe aortic stenosis; Snores; Thoracic ascending aortic aneurysm ([place]) (10/21/2016); and Type II diabetes mellitus ([place]).  Admitted with chest pain. RHC//LHC with 3 vessel disease. Taken to the OR 11/30 for cabg x 3, repair of thoracic ascending aneurysm, AVR bioprosthetic,  MVR, and open chest with VAC placement.  Remains intubated.   Currently on lasix drip at 4 mg per hour, milrinone 0.3, norepi 9 mcg, neo at 30 mcg. Also on nitric 30 ppm.     Review of Systems: [y] = yes, '[ ]'  = no - Obtained from chart. Currently intubated.   General: Weight gain '[ ]' ; Weight loss '[ ]' ; Anorexia '[ ]' ; Fatigue '[ ]' ; Fever '[ ]' ; Chills '[ ]' ; Weakness '[ ]'   Cardiac: Chest pain/pressure '[ ]' ; Resting SOB '[ ]' ; Exertional SOB '[ ]' ; Orthopnea '[ ]' ; Pedal Edema '[ ]' ; Palpitations '[ ]' ; Syncope '[ ]' ; Presyncope '[ ]' ; Paroxysmal nocturnal dyspnea'[ ]'   Pulmonary: Cough '[ ]' ; Wheezing'[ ]' ; Hemoptysis'[ ]' ; Sputum '[ ]' ; Snoring '[ ]'   GI: Vomiting'[ ]' ; Dysphagia'[ ]' ; Melena'[ ]' ; Hematochezia '[ ]' ; Heartburn'[ ]' ; Abdominal pain '[ ]' ; Constipation '[ ]' ; Diarrhea '[ ]' ; BRBPR '[ ]'   GU: Hematuria'[ ]' ; Dysuria '[ ]' ; Nocturia'[ ]'   Vascular: Pain in legs with walking '[ ]' ; Pain in feet with lying  flat '[ ]' ; Non-healing sores '[ ]' ; Stroke '[ ]' ; TIA '[ ]' ; Slurred speech '[ ]' ;  Neuro: Headaches'[ ]' ; Vertigo'[ ]' ; Seizures'[ ]' ; Paresthesias'[ ]' ;Blurred vision '[ ]' ; Diplopia '[ ]' ; Vision changes '[ ]'   Ortho/Skin: Arthritis '[ ]' ; Joint pain '[ ]' ; Muscle pain '[ ]' ; Joint swelling '[ ]' ; Back Pain '[ ]' ; Rash '[ ]'   Psych: Depression'[ ]' ; Anxiety'[ ]'   Heme: Bleeding problems '[ ]' ; Clotting disorders '[ ]' ; Anemia '[ ]'   Endocrine: Diabetes [Y ]; Thyroid dysfunction'[ ]'   Home Medications Prior to Admission medications   Medication Sig Start Date End Date Taking? Authorizing Provider  allopurinol (ZYLOPRIM) 300 MG tablet Take 300 mg by mouth daily.   Yes Historical Provider, MD  aspirin 81 MG tablet Take 81 mg by mouth 2 (two) times daily.    Yes Historical Provider, MD  Calcium-Magnesium-Zinc (CAL-MAG-ZINC PO) Take 1-2 tablets by mouth 2 (two) times daily. 1 tablet in the AM and 2 tablets in the PM.   Yes Historical Provider, MD  Cholecalciferol (VITAMIN D3) 1000 units CAPS Take 1,000 Units by mouth at bedtime.    Yes Historical Provider, MD  enalapril (VASOTEC) 20 MG tablet Take 20 mg by mouth 2 (two) times daily.   Yes Historical Provider, MD  finasteride (PROSCAR) 5 MG tablet Take 5 mg by mouth daily.    Yes Historical Provider, MD  gemfibrozil (LOPID) 600 MG tablet Take 600 mg by mouth 2 (two) times daily before a meal.   Yes Historical Provider, MD  Melatonin 10 MG CAPS Take  10 mg by mouth at bedtime.    Yes Historical Provider, MD  metFORMIN (GLUCOPHAGE-XR) 500 MG 24 hr tablet Take 500 mg by mouth at bedtime.  08/21/16  Yes Historical Provider, MD  metoprolol succinate (TOPROL XL) 25 MG 24 hr tablet Take 1 tablet (25 mg total) by mouth daily. 10/19/16  Yes Minus Breeding, MD  Omega-3 Fatty Acids (FISH OIL) 1000 MG CPDR Take 2,000 mg by mouth 2 (two) times daily.    Yes Historical Provider, MD  rOPINIRole (REQUIP) 2 MG tablet Take 2 mg by mouth See admin instructions. Takes 53m at 4:30pm and 262mat 9:30pm.   Yes  Historical Provider, MD  Turmeric 500 MG CAPS Take 500 mg by mouth 2 (two) times daily.    Yes Historical Provider, MD  vitamin E 400 UNIT capsule Take 400 Units by mouth daily.   Yes Historical Provider, MD  nitroGLYCERIN (NITROSTAT) 0.4 MG SL tablet Place 0.4 mg under the tongue every 5 (five) minutes as needed.  10/16/16   Historical Provider, MD    Past Medical History: Past Medical History:  Diagnosis Date  . Arthritis    "hips, shoulders; knees; back" (10/20/2016)  . Bicuspid aortic valve   . BPH (benign prostatic hypertrophy)   . Carpal tunnel syndrome of right wrist   . Coronary artery disease involving native coronary artery of native heart with unstable angina pectoris [place]  . Diverticulitis   . GERD (gastroesophageal reflux disease)   . Gout   . Heart murmur   . Hyperlipemia   . Hypertension   . RLS (restless legs syndrome)   . S/P aortic valve replacement with bioprosthetic valve 10/22/2016   [facility] valve  . S/P ascending aortic aneurysm repair 10/22/2016   28 mm supracoronary straight graft replacement of ascending thoracic aortic aneurysm  . S/P CABG x 3 10/22/2016   Sequential LIMA to Diag and LAD, SVG to distal LAD, open vein harvest right thigh  . S/P mitral valve repair 10/22/2016   Artificial Gore-tex neochord placement x6 - posterior annuloplasty band placed but removed due to systolic anterior motion of mitral valve  . Severe aortic stenosis   . Snores    Never been tested for sleep apnea  . Thoracic ascending aortic aneurysm ([place]/2017  . Type II diabetes mellitus ([place]    Past Surgical History: Past Surgical History:  Procedure Laterality Date  . CARDIAC CATHETERIZATION N/A 10/20/2016   Procedure: Right/Left Heart Cath and Coronary Angiography;  Surgeon: DaLeonie ManMD;  [facility];  Service: Cardiovascular;  Laterality: N/A;  . CARPAL TUNNEL RELEASE  Right 11/28/2013   Procedure: RIGHT WRIST CARPAL TUNNEL RELEASE;  Surgeon: RoLorn JunesMD;  [place] [facility] Service: Orthopedics;  Laterality: Right;  . CATARACT EXTRACTION W/ INTRAOCULAR LENS  IMPLANT, BILATERAL Bilateral 1978  . COLONOSCOPY    . FRACTURE SURGERY    . INGUINAL HERNIA REPAIR Right 1998  . LIPOMA EXCISION Right 2008   "side of my head"  . PENECTOMY  2007   Peyronie's disease   . PENILE PROSTHESIS IMPLANT  2009  . SHOULDER OPEN ROTATOR CUFF REPAIR Right 2006  . TONSILLECTOMY  ~ 1955  . TRANSURETHRAL RESECTION OF PROSTATE  2005  . TRIGGER FINGER RELEASE Bilateral    several lt and rt hands  . TRIGGER FINGER RELEASE Right 11/28/2013   Procedure: RIGHT TRIGGER FINGER  RELEASE (TENDON SHEATH  INCISION);  Surgeon: Lorn Junes, MD;  Location: [place];  Service: Orthopedics;  Laterality: Right;  . WRIST FRACTURE SURGERY Right ~ 102    Family History: Family History  Problem Relation Age of Onset  . Lung cancer Mother   . Clotting disorder Father     No details  . Heart disease Sister 88    Stents  . Cancer Sister     Throat    Social History: Social History   Social History  . Marital status: Married    Spouse name: N/A  . Number of children: N/A  . Years of education: N/A   Occupational History  . [employer]    retired   Social History Main Topics  . Smoking status: Former Smoker    Years: 3.00    Types: Pipe, Cigars    Quit date: 1984  . Smokeless tobacco: Never Used  . Alcohol use 6.0 oz/week    10 Cans of beer per week  . Drug use: No  . Sexual activity: Not Currently   Other Topics Concern  . None   Social History Narrative  . None    Allergies:  Allergies  Allergen Reactions  . Doxycycline Hives  . Penicillins Hives    Has patient had a PCN reaction causing immediate rash, facial/tongue/throat swelling, SOB or lightheadedness with hypotension: No Has patient had a PCN  reaction causing severe rash involving mucus membranes or skin necrosis: No Has patient had a PCN reaction that required hospitalization: No Has patient had a PCN reaction occurring within the last 10 years: Yes If all of the above answers are "NO", then may proceed with Cephalosporin use.   . Sulfa Antibiotics Hives    Objective:    Vital Signs:   Temp:  [97.3 F (36.3 C)-101.1 F (38.4 C)] 101.1 F (38.4 C) (12/01 0900) Pulse Rate:  [70-89] 87 (12/01 0900) Resp:  [11-17] 15 (12/01 0900) BP: (100-133)/(64-92) 121/86 (12/01 0900) SpO2:  [100 %] 100 % (12/01 0925) Arterial Line BP: (87-131)/(63-81) 122/73 (12/01 0900) FiO2 (%):  [50 %-80 %] 50 % (12/01 0925) Weight:  [250 lb (113.4 kg)] 250 lb (113.4 kg) (12/01 0500) Last BM Date: 10/20/16  Weight change: Filed Weights   10/21/16 0557 10/22/16 0518 10/23/16 0500  Weight: 216 lb 0.8 oz (98 kg) 208 lb 12.4 oz (94.7 kg) 250 lb (113.4 kg)    Intake/Output:   Intake/Output Summary (Last 24 hours) at 10/23/16 0951 Last data filed at 10/23/16 0900  Gross per 24 hour  Intake         13452.28 ml  Output            17730 ml  Net         -4277.72 ml     Physical Exam: CVP 14-16 General:  Sedated/Intubated.  HEENT: normal Neck: supple. JVP to jaw  . Carotids 2+ bilat; no bruits. No lymphadenopathy or thryomegaly appreciated. RIJ swan Cor: PMI nondisplaced. Regular rate & rhythm. No rubs, gallops or murmurs. VAC in place Lungs: decreased.  Abdomen: soft, nontender, nondistended. No hepatosplenomegaly. No bruits or masses. Good bowel sounds. Extremities: no cyanosis, clubbing, rash, edema. L radial aline Neuro: sedated/intubated.  GU: foley.  Telemetry: A sensed V paced 90  Labs: Basic Metabolic Panel:  Recent Labs Lab 10/19/16 1123  10/21/16 0217 10/22/16 0552  10/22/16 1704 10/22/16 1740 10/22/16 1900 10/22/16 1955 10/22/16 2153 10/23/16 0400  NA 140  --  138  137  < > 142 143 144 147* 146* 141  K 4.8  --  4.5  4.1  < > 4.6 4.9 4.7 4.0 4.1 4.2  CL 105  --  108 107  < > 107 110 114* 111  --  114*  CO2 25  --  22 19*  --   --   --   --   --   --  18*  GLUCOSE 120*  --  131* 138*  < > 123* 98 115* 106* 134* 142*  BUN 14  --  14 14  < > '12 12 10 10  ' --  12  CREATININE 0.87  < > 0.90 0.79  < > 0.50* 0.60* 0.60* 0.60*  --  1.01  CALCIUM 9.8  --  9.0 9.4  --   --   --   --   --   --  8.5*  MG  --   --   --   --   --   --   --   --   --   --  2.5*  < > = values in this interval not displayed.  Liver Function Tests:  Recent Labs Lab 10/21/16 0217  AST 28  ALT 22  ALKPHOS 62  BILITOT 0.6  PROT 5.6*  ALBUMIN 3.6   No results for input(s): LIPASE, AMYLASE in the last 168 hours. No results for input(s): AMMONIA in the last 168 hours.  CBC:  Recent Labs Lab 10/21/16 0217 10/22/16 0552  10/22/16 1345  10/22/16 1733  10/22/16 1951 10/22/16 1955 10/22/16 2153 10/22/16 2155 10/23/16 0400  WBC 5.4 4.0  --   --   --   --   --  13.2*  --   --  11.1* 9.6  HGB 13.9 14.4  < > 10.1*  < > 10.8*  < > 11.9* 10.5* 10.5* 11.8* 11.2*  HCT 40.5 41.4  < > 29.0*  < > 31.0*  < > 34.5* 31.0* 31.0* 33.5* 32.0*  MCV 86.2 86.3  --   --   --   --   --  85.6  --   --  84.8 83.3  PLT 151 147*  --  63*  --  29*  --  84*  --   --  70* 119*  < > = values in this interval not displayed.  Cardiac Enzymes: No results for input(s): CKTOTAL, CKMB, CKMBINDEX, TROPONINI in the last 168 hours.  BNP: BNP (last 3 results) No results for input(s): BNP in the last 8760 hours.  ProBNP (last 3 results) No results for input(s): PROBNP in the last 8760 hours.   CBG:  Recent Labs Lab 10/23/16 0408 10/23/16 0508 10/23/16 0607 10/23/16 0704 10/23/16 0750  GLUCAP 128* 149* 149* 151* 130*    Coagulation Studies:  Recent Labs  10/22/16 2104 10/22/16 2155  LABPROT 21.6* 20.6*  INR 1.85 1.74    Other results: EKG:  Imaging: Dg Chest Port 1 View  Result Date: 10/23/2016 CLINICAL DATA:  Continued surveillance  post CABG. EXAM: PORTABLE CHEST 1 VIEW COMPARISON:  10/22/2016. FINDINGS: ET tube remains 6 cm above carina. Swan-Ganz catheter, BILATERAL chest tubes, mediastinal tube also stable. BILATERAL lower lobe opacities are increased representing either pneumonia, atelectasis, or edema. No pneumothorax. IMPRESSION: Worsening aeration. Electronically Signed   By: Staci Righter M.D.   On: 10/23/2016 08:10   Dg Chest Port 1 View  Result Date: 10/22/2016 CLINICAL DATA:  Endotracheal tube and chest tube placement.  Central line placement. Postoperative radiograph. Initial encounter. EXAM: PORTABLE CHEST 1 VIEW COMPARISON:  CT of the chest performed 10/21/2016 FINDINGS: The patient's endotracheal tube is seen ending 7-8 cm above the carina. An enteric tube is noted extending below the diaphragm. A right-sided Swan-Ganz catheter is noted ending about the pulmonary outflow tract. Mediastinal drains are seen. Bilateral chest tubes are noted. Vascular congestion is noted. Mild bilateral atelectasis is seen. No pleural effusion or pneumothorax is seen. The right costophrenic angle is incompletely imaged on this study. The cardiomediastinal silhouette remains normal in size. No acute osseous abnormalities are identified. IMPRESSION: 1. Endotracheal tube seen ending 7-8 cm above the carina. Remaining tubes and lines as described above. 2. Vascular congestion noted.  Mild bilateral atelectasis seen. Electronically Signed   By: Garald Balding M.D.   On: 10/22/2016 22:17   Ct Angio Chest Aorta W/cm &/or Wo/cm  Result Date: 10/21/2016 CLINICAL DATA:  Chest pain. EXAM: CT ANGIOGRAPHY CHEST WITH CONTRAST TECHNIQUE: Multidetector CT imaging of the chest was performed using the standard protocol during bolus administration of intravenous contrast. Multiplanar CT image reconstructions and MIPs were obtained to evaluate the vascular anatomy. CONTRAST:  100 mL of Isovue 370 intravenously. COMPARISON:  None. FINDINGS: Cardiovascular:  Atherosclerosis of thoracic aorta is noted without dissection. 4.2 cm ascending thoracic aortic aneurysm is noted. Transverse aortic arch measures 2.9 cm. Descending thoracic aorta measures 2.4 cm. Pulmonary vessels are unremarkable. Great vessels are widely patent without stenosis. Coronary artery calcifications are noted. Aortic valve calcifications are noted. Mediastinum/Nodes: No enlarged mediastinal, hilar, or axillary lymph nodes. Thyroid gland, trachea, and esophagus demonstrate no significant findings. Lungs/Pleura: Lungs are clear. No pleural effusion or pneumothorax. Upper Abdomen: Solitary gallstone is noted. Musculoskeletal: No chest wall abnormality. No acute or significant osseous findings. Review of the MIP images confirms the above findings. IMPRESSION: Atherosclerosis of thoracic aorta without dissection. Coronary artery calcifications are noted suggesting coronary artery disease. Aortic valve calcifications are noted. 4.2 cm ascending thoracic aortic aneurysm. Recommend annual imaging followup by CTA or MRA. This recommendation follows 2010 ACCF/AHA/AATS/ACR/ASA/SCA/SCAI/SIR/STS/SVM Guidelines for the Diagnosis and Management of Patients with Thoracic Aortic Disease. Circulation. 2010; 121: J194-R740. Solitary gallstone is noted. Electronically Signed   By: Marijo Conception, M.D.   On: 10/21/2016 13:41      Medications:     Current Medications: . sodium chloride   Intravenous Once  . acetaminophen  1,000 mg Oral Q6H   Or  . acetaminophen (TYLENOL) oral liquid 160 mg/5 mL  1,000 mg Per Tube Q6H  . aspirin EC  325 mg Oral Daily   Or  . aspirin  324 mg Per Tube Daily  . bisacodyl  10 mg Oral Daily   Or  . bisacodyl  10 mg Rectal Daily  . chlorhexidine  15 mL Mouth/Throat NOW  . chlorhexidine gluconate (MEDLINE KIT)  15 mL Mouth Rinse BID  . docusate sodium  200 mg Oral Daily  . famotidine (PEPCID) IV  20 mg Intravenous Q12H  . insulin detemir  20 Units Subcutaneous BID  .  insulin regular  0-10 Units Intravenous TID WC  . levofloxacin (LEVAQUIN) IV  750 mg Intravenous Q24H  . mouth rinse  15 mL Mouth Rinse 10 times per day  . potassium chloride (KCL MULTIRUN) 30 mEq in 265 mL IVPB  30 mEq Intravenous Once  . sodium chloride flush  3 mL Intravenous Q12H     Infusions: . sodium chloride 20 mL/hr at 10/23/16 0900  . sodium  chloride    . sodium chloride    . dexmedetomidine 1.2 mcg/kg/hr (10/23/16 0900)  . furosemide (LASIX) infusion 4 mg/hr (10/23/16 0942)  . insulin (NOVOLIN-R) infusion 5.6 Units/hr (10/23/16 0900)  . lactated ringers 20 mL/hr at 10/23/16 0900  . lactated ringers 20 mL/hr at 10/23/16 0900  . milrinone 0.3 mcg/kg/min (10/23/16 0900)  . nitroGLYCERIN    . norepinephrine (LEVOPHED) Adult infusion 8.96 mcg/min (10/23/16 0900)  . phenylephrine (NEO-SYNEPHRINE) Adult infusion 30 mcg/min (10/23/16 0900)  . vasopressin (PITRESSIN) infusion - *FOR SHOCK* 0.03 Units/min (10/23/16 0900)      Assessment/Plan/Discussion   Scott Sidor is 70 year old admitted with CP. Had [place] with 3 vessel disease. He is S/P CABG x3, MVR, AVR, AAA and open chest with VAC.  1. CAD- 10/22/16 S/P CABG x3 with open chest. VAC in place.  CVP 14-16. Started on lasix drip earlier today 4 mg per hour. Remains on Norepi 8 mcg, Milrinone 0.375 mcg, Neo 30 mcg. Renal function ok.  2. Severe Aortic Stenosis - 11/302017 S/P AVR bioprostheic  3. Flail Posterior Leaflet - mitral valve- 10/22/2016 S/P  MVR bioprostheic 4. VDRF- CCM following for vent management.  6. Former Smoker  Length of Stay: [facility] NP-C  10/23/2016, 9:51 AM  Advanced Heart Failure Team Pager [phone] (M-F; [place])  Please contact [facility] for night-coverage after hours (4p -7a ) and weekends on [email or web address]  Patient seen with NP, agree with the above note.   Complex operation yesterday.  CABG x 3 + bioprosthetic AVR for severe bicuspid AS + ascending aorta replacement + MV repair for  flail P2.  MV repair complicated by severe SAM, ended up having to remove posterior annuloplasty band.  Chest remains open.   Of note, LV EF 60-65% with severe LVH and normal RV function on echo pre-op, LV function normal on intra-op TEE.   Currently, BP stable on norepinephrine + vasopressin.  Cardiac index remains marginal, 1.9 most recently.  Based on his physiology, norepinephrine and vasopressin should be helpful to increase SVR and decrease risk of SAM.  It is possible that we are not helping things with the inotropy and vasodilation from milrinone.   - I will ask the nurse to decrease milrinone to 0.25.  Recheck cardiac index after that, leave at lower level if stable, raise back up if CI drops.  - Would repeat echo when chest closed, etc.   He remains on NO, PA pressure is well-controlled.  Could start weaning if Dr Roxy Manns agrees.   CVP 19-20 earlier, low dose Lasix gtt started.  Good UOP.  Continue but would be careful to avoid dropping preload too much given his physiology.  Would aim to keep CVP no lower than 10.   Fever this afternoon, he is on vancomycin and levofloxacin.  Possible pulmonary source.  Send blood cultures.   Loralie Champagne 10/23/2016 2:39 PM

## 2016-10-23 NOTE — Progress Notes (Signed)
Initial Nutrition Assessment  DOCUMENTATION CODES:   Obesity unspecified  INTERVENTION:    If TF started, recommend initiating Vital High Protein at goal rate of 55 ml/h (1320 ml per day) and Prostat 30 ml BID to provide 1520 kcals, 145 gm protein, 1103 ml free water daily  NUTRITION DIAGNOSIS:   Inadequate oral intake related to inability to eat as evidenced by NPO status  GOAL:   Provide needs based on ASPEN/SCCM guidelines  MONITOR:   Vent status, Labs, Weight trends, Skin, I & O's  REASON FOR ASSESSMENT:   Ventilator  ASSESSMENT:   70 yo former smoker with known CAD and ascending aorta aneurysm, who was taken to OR 11/30 for cabg x 3, aortic root replacement, MVR replacement, thoracic cavity left open and had extended pump time in OR. On APRV ventilation post op, CI 1.6 and multiple pressors. PCCM asked to manage vent.   Pt s/p procedures 11/30: AORTIC VALVE REPLACEMENT MITRAL VALVE REPAIR REPAIR OF ASCENDING THORACIC AORTIC ANEURYSM CORONARY ARTERY BYPASS GRAFTING x 3  Patient is currently intubated on ventilator support >> OGT in place Temp (24hrs), Avg:99 F (37.2 C), Min:97.3 F (36.3 C), Max:101.3 F (38.5 C)  Pt was on a Heart Healthy/Carbohydrate Modified diet prior to surgery. PO intake was good at 100% per flowsheet records. No muscle or subcutaneous fat depletion noticed. Labs and medications reviewed. CBG's [id number].  Diet Order:  Diet NPO time specified  Skin:  Wound (see comment) (sternal wound VAC)  Last BM:  11/28  Height:   Ht Readings from Last 1 Encounters:  10/23/16 5\' 9"  (1.753 m)    Weight:   Wt Readings from Last 1 Encounters:  10/23/16 250 lb (113.4 kg)    Ideal Body Weight:  72.7 kg  BMI:  Body mass index is 36.92 kg/m.  Estimated Nutritional Needs:   Kcal:  [id number]  Protein:  145-155 gm  Fluid:  per MD  EDUCATION NEEDS:   No education needs identified at this time  Arthur Holms, RD, LDN Pager #:  [phone] After-Hours Pager #: [phone]

## 2016-10-23 NOTE — Progress Notes (Signed)
      [address]       [place] [address]             [phone]      Intubated sedated  BP 91/66   Pulse 89   Temp (!) 101.3 F (38.5 C)   Resp 16   Ht 5\' 9"  (1.753 m)   Wt 250 lb (113.4 kg)   SpO2 100%   BMI 36.92 kg/m    Intake/Output Summary (Last 24 hours) at 10/23/16 1707 Last data filed at 10/23/16 1605  Gross per 24 hour  Intake         13338.62 ml  Output            16155 ml  Net         -2816.38 ml   PA 29/20 CI= 1.9 on milrinone, norepi, vasopressin and nitric oxide  Creatinine 1.02 Hct= 29, PLT 87 K  Febrile- blood cultures sent on levaquin and vancomycin  Good UO on lasix drip  Scott Lipps C. Roxan Hockey, MD [facility] [phone]

## 2016-10-23 NOTE — Consult Note (Signed)
PULMONARY / CRITICAL CARE MEDICINE   Name: Scott [name] MRN: [id number] DOB: 1946-08-06    ADMISSION DATE:  10/20/2016 CONSULTATION DATE: 12/1  REFERRING MD: Scott [name]  CHIEF COMPLAINT:  CAD  HISTORY OF PRESENT ILLNESS:   70 yo former smoker with known CAD and ascending aorta aneurysm, who was taken to OR 11/30 for cabg x 3, aortic root replacement, MVR replacement, thoracic cavity left open and had extended pump time in OR. On APRV ventilation post op, CI 1.6 and multiple pressors. PCCM asked to manage vent.  PAST MEDICAL HISTORY :  He  has a past medical history of Arthritis; Bicuspid aortic valve; BPH (benign prostatic hypertrophy); Carpal tunnel syndrome of right wrist; Coronary artery disease involving native coronary artery of native heart with unstable angina pectoris (Scott [name]) (10/20/2016); Diverticulitis; GERD (gastroesophageal reflux disease); Gout; Heart murmur; Hyperlipemia; Hypertension; RLS (restless legs syndrome); S/P aortic valve replacement with bioprosthetic valve (10/22/2016); S/P ascending aortic aneurysm repair (10/22/2016); S/P CABG x 3 (10/22/2016); S/P mitral valve repair (10/22/2016); Severe aortic stenosis; Snores; Thoracic ascending aortic aneurysm (Scott [name]) (10/21/2016); and Type II diabetes mellitus (Scott [name]).  PAST SURGICAL HISTORY: He  has a past surgical history that includes Transurethral resection of prostate (2005); Lipoma excision (Right, 2008); Colonoscopy; Trigger finger release (Bilateral); Penile prosthesis implant (2009); Penectomy (2007); Carpal tunnel release (Right, 11/28/2013); Trigger finger release (Right, 11/28/2013); Tonsillectomy (~ 1955); Inguinal hernia repair (Right, 1998); Fracture surgery; Wrist fracture surgery (Right, ~ 1959); Shoulder open rotator cuff repair (Right, 2006); Cataract extraction w/ intraocular lens  implant, bilateral (Bilateral, 1978); and Cardiac catheterization (N/A, 10/20/2016).  Allergies  Allergen Reactions  . Doxycycline Hives   . Penicillins Hives    Has patient had a PCN reaction causing immediate rash, facial/tongue/throat swelling, SOB or lightheadedness with hypotension: No Has patient had a PCN reaction causing severe rash involving mucus membranes or skin necrosis: No Has patient had a PCN reaction that required hospitalization: No Has patient had a PCN reaction occurring within the last 10 years: Yes If all of the above answers are "NO", then may proceed with Cephalosporin use.   . Sulfa Antibiotics Hives    No current facility-administered medications on file prior to encounter.    Current Outpatient Prescriptions on File Prior to Encounter  Medication Sig  . allopurinol (ZYLOPRIM) 300 MG tablet Take 300 mg by mouth daily.  Marland Kitchen aspirin 81 MG tablet Take 81 mg by mouth 2 (two) times daily.   . Calcium-Magnesium-Zinc (CAL-MAG-ZINC PO) Take 1-2 tablets by mouth 2 (two) times daily. 1 tablet in the AM and 2 tablets in the PM.  . Cholecalciferol (VITAMIN D3) 1000 units CAPS Take 1,000 Units by mouth at bedtime.   . enalapril (VASOTEC) 20 MG tablet Take 20 mg by mouth 2 (two) times daily.  . finasteride (PROSCAR) 5 MG tablet Take 5 mg by mouth daily.   Marland Kitchen gemfibrozil (LOPID) 600 MG tablet Take 600 mg by mouth 2 (two) times daily before a meal.  . Melatonin 10 MG CAPS Take 10 mg by mouth at bedtime.   . metFORMIN (GLUCOPHAGE-XR) 500 MG 24 hr tablet Take 500 mg by mouth at bedtime.   . metoprolol succinate (TOPROL XL) 25 MG 24 hr tablet Take 1 tablet (25 mg total) by mouth daily.  . Omega-3 Fatty Acids (FISH OIL) 1000 MG CPDR Take 2,000 mg by mouth 2 (two) times daily.   Marland Kitchen rOPINIRole (REQUIP) 2 MG tablet Take 2 mg by mouth See admin instructions. Takes 2mg  at 4:30pm  and 2mg  at 9:30pm.  . Turmeric 500 MG CAPS Take 500 mg by mouth 2 (two) times daily.   . vitamin E 400 UNIT capsule Take 400 Units by mouth daily.  . nitroGLYCERIN (NITROSTAT) 0.4 MG SL tablet Place 0.4 mg under the tongue every 5 (five) minutes as  needed.     FAMILY HISTORY:  His indicated that his mother is deceased. He indicated that his father is deceased. He indicated that both of his sisters are alive.    SOCIAL HISTORY: He  reports that he quit smoking about 33 years ago. His smoking use included Pipe and Cigars. He quit after 3.00 years of use. He has never used smokeless tobacco. He reports that he drinks about 6.0 oz of alcohol per week . He reports that he does not use drugs.  REVIEW OF SYSTEMS:   na  SUBJECTIVE:  Sedated on vent  VITAL SIGNS: BP (!) 105/91 (BP Location: Left Arm)   Pulse 89   Temp (!) 101.1 F (38.4 C)   Resp 15   Ht 5\' 9"  (1.753 m)   Wt 250 lb (113.4 kg)   SpO2 100%   BMI 36.92 kg/m   HEMODYNAMICS: PAP: (34-38)/(28-32) 35/28 CO:  [2.8 L/min-3.5 L/min] 2.8 L/min CI:  [1.3 L/min/m2-1.7 L/min/m2] 1.3 L/min/m2  VENTILATOR SETTINGS: Vent Mode: Bi-Vent FiO2 (%):  [50 %-80 %] 50 % Set Rate:  [15 bmp] 15 bmp  INTAKE / OUTPUT: I/O last 3 completed shifts: In: 13466.4 [P.O.:150; I.V.:6702.4; YP:7842919; NG/GT:90; IV [id number] Out: [id number] [Urine:6680; Emesis/NG output:100; Drains:350; Blood:10500; Chest Tube:280]  PHYSICAL EXAMINATION: General:  WNWDWM, sedated on vent Neuro: Arouses and follows comands HEENT: Scleral edema , PERL 3 mm Cardiovascular:  HSR M Lungs:  Decreased bs thru out Abdomen:  Soft  Musculoskeletal:  intact Skin: cool dry  LABS:  BMET  Recent Labs Lab 10/21/16 0217 10/22/16 0552  10/22/16 1900 10/22/16 1955 10/22/16 2153 10/23/16 0400  NA 138 137  < > 144 147* 146* 141  K 4.5 4.1  < > 4.7 4.0 4.1 4.2  CL 108 107  < > 114* 111  --  114*  CO2 22 19*  --   --   --   --  18*  BUN 14 14  < > 10 10  --  12  CREATININE 0.90 0.79  < > 0.60* 0.60*  --  1.01  GLUCOSE 131* 138*  < > 115* 106* 134* 142*  < > = values in this interval not displayed.  Electrolytes  Recent Labs Lab 10/21/16 0217 10/22/16 0552 10/23/16 0400  CALCIUM 9.0 9.4 8.5*  MG   --   --  2.5*    CBC  Recent Labs Lab 10/22/16 1951  10/22/16 2153 10/22/16 2155 10/23/16 0400  WBC 13.2*  --   --  11.1* 9.6  HGB 11.9*  < > 10.5* 11.8* 11.2*  HCT 34.5*  < > 31.0* 33.5* 32.0*  PLT 84*  --   --  70* 119*  < > = values in this interval not displayed.  Coag's  Recent Labs Lab 10/19/16 1123 10/22/16 2104 10/22/16 2155  APTT 34 56* 47*  INR 1.1 1.85 1.74    Sepsis Markers No results for input(s): LATICACIDVEN, PROCALCITON, O2SATVEN in the last 168 hours.  ABG  Recent Labs Lab 10/22/16 2001 10/22/16 2153 10/23/16 0508  PHART 7.297* 7.342* 7.453*  PCO2ART 40.1 34.0 27.2*  PO2ART 92.0 254.0* 173.0*    Liver Enzymes  Recent Labs Lab  10/21/16 0217  AST 28  ALT 22  ALKPHOS 62  BILITOT 0.6  ALBUMIN 3.6    Cardiac Enzymes No results for input(s): TROPONINI, PROBNP in the last 168 hours.  Glucose  Recent Labs Lab 10/23/16 0301 10/23/16 0408 10/23/16 0508 10/23/16 0607 10/23/16 0704 10/23/16 0750  GLUCAP 132* 128* 149* 149* 151* 130*    Imaging Dg Chest Port 1 View  Result Date: 10/23/2016 CLINICAL DATA:  Continued surveillance post CABG. EXAM: PORTABLE CHEST 1 VIEW COMPARISON:  10/22/2016. FINDINGS: ET tube remains 6 cm above carina. Swan-Ganz catheter, BILATERAL chest tubes, mediastinal tube also stable. BILATERAL lower lobe opacities are increased representing either pneumonia, atelectasis, or edema. No pneumothorax. IMPRESSION: Worsening aeration. Electronically Signed   By: Staci Righter M.D.   On: 10/23/2016 08:10   Dg Chest Port 1 View  Result Date: 10/22/2016 CLINICAL DATA:  Endotracheal tube and chest tube placement. Central line placement. Postoperative radiograph. Initial encounter. EXAM: PORTABLE CHEST 1 VIEW COMPARISON:  CT of the chest performed 10/21/2016 FINDINGS: The patient's endotracheal tube is seen ending 7-8 cm above the carina. An enteric tube is noted extending below the diaphragm. A right-sided Swan-Ganz  catheter is noted ending about the pulmonary outflow tract. Mediastinal drains are seen. Bilateral chest tubes are noted. Vascular congestion is noted. Mild bilateral atelectasis is seen. No pleural effusion or pneumothorax is seen. The right costophrenic angle is incompletely imaged on this study. The cardiomediastinal silhouette remains normal in size. No acute osseous abnormalities are identified. IMPRESSION: 1. Endotracheal tube seen ending 7-8 cm above the carina. Remaining tubes and lines as described above. 2. Vascular congestion noted.  Mild bilateral atelectasis seen. Electronically Signed   By: Garald Balding M.D.   On: 10/22/2016 22:17     STUDIES:    CULTURES:   ANTIBIOTICS: 11/30 levaquin>>  SIGNIFICANT EVENTS: 11/30 OHS>>  LINES/TUBES: 11.30 OTT>> 11/30 rt I j PA cath>> 11/30 l rad aline>> 11/30 CT x 4>> 11/30 wound vac>>  DISCUSSION: 70 yo former smoker with known CAD and ascending aorta aneurysm, who was taken to OR 11/30 for cabg x 3, aortic root replacement, MVR replacement, thoracic cavity left open and had extended pump time in OR. On APRV ventilation post op, CI 1.6 and multiple pressors. PCCM asked to manage vent.  ASSESSMENT / PLAN:  PULMONARY A: VDRF secondary to extensive OHS 11/30,  prolonged pump time Former smoker P:   Transition from APRV ventilation to PRVC Vent bundle Follow c x r Keep sedated till sternum closed  CARDIOVASCULAR A:  Post cabg x 3, MVR, ascending aortic root repalcement Open chest wound Hypotension CAD CI 1.6 P:  Wean pressor as possible May need IABP if CI remains low  RENAL Lab Results  Component Value Date   CREATININE 1.01 10/23/2016   CREATININE 0.60 (L) 10/22/2016   CREATININE 0.60 (L) 10/22/2016   CREATININE 0.87 10/19/2016    A:   NO acute issue P:   Rack renal function and lytes  GASTROINTESTINAL A:   Hx of Gerd  P:   PPI  HEMATOLOGIC A:   No acute issues Mild thrombocytopenia  P:   Trend  hematology labs   INFECTIOUS A:   Open chest cavity  P:   Levaquin per cvts Day #1/x  ENDOCRINE A:   DM P:   SSI Insulin drip  NEUROLOGIC A:   Post OHS with prolonged pump time 11/30 Awake and follows commands 12/1 P:   RASS goal: -1 Sedate for tube tolerance  and with open chest cavitary.   FAMILY  - Updates: None at bedside  - Inter-disciplinary family meet or Palliative Care meeting due by: 12/8  Attending Note:  70 year old male, s/p MVR, CABG, aortic root replacement and now with an open left cavity and long pump time.  The patient is currently on APRV and nitric with an improving PaO2.  PCCM consulted for vent and hemodynamic monitoring.  On exam, coarse BS diffusely and patient is heavily sedated.  I reviewed CXR myself, ETT in good position, right sided chest tube and swan in place.  Plan to switch patient from APRV to [facility].  F/U ABG with high PEEP.  Continue nitric.  Continue precedex for sedation.  Titrate milrinone, norepi, vaso and neo for hemodynamic support.  Hold off any weaning until chest is closed for now.  Hold off TF for now as well.  PCCM will continue to follow.  The patient is critically ill with multiple organ systems failure and requires high complexity decision making for assessment and support, frequent evaluation and titration of therapies, application of advanced monitoring technologies and extensive interpretation of multiple databases.   Critical Care Time devoted to patient care services described in this note is  35  Minutes. This time reflects time of care of this signee Dr Jennet Maduro. This critical care time does not reflect procedure time, or teaching time or supervisory time of PA/NP/Med student/Med Resident etc but could involve care discussion time.  Rush Farmer, M.D. [facility] Pulmonary/Critical Care Medicine. Pager: [phone]. After hours pager: [phone].  10/23/2016, 9:11 AM

## 2016-10-23 NOTE — Care Management Important Message (Signed)
Important Message  Patient Details  Name: Scott [name] MRN: [id number] Date of Birth: 25-Aug-1946   Medicare Important Message Given:  Yes    Nathen May 10/23/2016, 12:16 PM

## 2016-10-23 NOTE — Progress Notes (Signed)
[address]       [place] [address]             [phone]        CARDIOTHORACIC SURGERY PROGRESS NOTE   R1 Day Post-Op Procedure(s) (LRB): CORONARY ARTERY BYPASS GRAFTING (CABG)x 2 WITH LIMA TO DIAGONAL, OPEN  HARVESTING OF RIGHT SAPHENOUS VEIN FOR VEIN GRAFT TO LAD (N/A) AORTIC VALVE REPLACEMENT (AVR) WITH SIZE 25 MM MAGNA EASE PERICARDIAL BIOPROSTHESIS - AORTIC (N/A) TRANSESOPHAGEAL ECHOCARDIOGRAM (TEE) (N/A) MITRAL VALVE REPAIR (MVR) WITH SIZE 30 SORIN ANNULOFLEX ANNULOPLASTY RING WITH SUBSEQUENT REMOVAL OF RING (N/A) ASCENDING AORTIC  ANEURYSM REPAIR (AAA) WITH 28 MM HEMASHIELD PLATINUM WOVEN DOUBLE VELOUR VASCULAR GRAFT  Subjective: Sedated on vent.  Has woken up and follows commands  Objective: Vital signs: BP Readings from Last 1 Encounters:  10/23/16 116/83   Pulse Readings from Last 1 Encounters:  10/23/16 89   Resp Readings from Last 1 Encounters:  10/23/16 15   Temp Readings from Last 1 Encounters:  10/23/16 (!) 100.8 F (38.2 C)    Hemodynamics: PAP: (34-38)/(28-32) 35/30 CO:  [2.9 L/min-3.5 L/min] 3.1 L/min CI:  [1.4 L/min/m2-1.7 L/min/m2] 1.5 L/min/m2  Physical Exam:  Rhythm:   AV paced  Breath sounds: Coarse but clear  Heart sounds:  RRR  Incisions:  Wound VAC intact and flat  Abdomen:  Soft, non-distended, quiet  Extremities:  Warm, well-perfused  Chest tubes:  low volume thin serosanguinous output, no air leak    Intake/Output from previous day: 11/30 0701 - 12/01 0700 In: 13161.4 [I.V.:6547.4; YP:7842919; NG/GT:90; IV [id number] Out: [id number] [Urine:6680; Emesis/NG output:100; Drains:350; Blood:10500; Chest Tube:280] Intake/Output this shift: No intake/output data recorded.  Lab Results:  CBC: Recent Labs  10/22/16 2155 10/23/16 0400  WBC 11.1* 9.6  HGB 11.8* 11.2*  HCT 33.5* 32.0*  PLT 70* 119*    BMET:  Recent Labs  10/22/16 0552  10/22/16 1955 10/22/16 2153 10/23/16 0400  NA 137  < > 147*  146* 141  K 4.1  < > 4.0 4.1 4.2  CL 107  < > 111  --  114*  CO2 19*  --   --   --  18*  GLUCOSE 138*  < > 106* 134* 142*  BUN 14  < > 10  --  12  CREATININE 0.79  < > 0.60*  --  1.01  CALCIUM 9.4  --   --   --  8.5*  < > = values in this interval not displayed.   PT/INR:   Recent Labs  10/22/16 2155  LABPROT 20.6*  INR 1.74    CBG (last 3)   Recent Labs  10/23/16 0607 10/23/16 0704 10/23/16 0750  GLUCAP 149* 151* 130*    ABG    Component Value Date/Time   PHART 7.453 (H) 10/23/2016 0508   PCO2ART 27.2 (L) 10/23/2016 0508   PO2ART 173.0 (H) 10/23/2016 0508   HCO3 18.9 (L) 10/23/2016 0508   TCO2 20 10/23/2016 0508   ACIDBASEDEF 4.0 (H) 10/23/2016 0508   O2SAT 100.0 10/23/2016 0508    CXR: PORTABLE CHEST 1 VIEW  COMPARISON:  10/22/2016.  FINDINGS: ET tube remains 6 cm above carina. Swan-Ganz catheter, BILATERAL chest tubes, mediastinal tube also stable. BILATERAL lower lobe opacities are increased representing either pneumonia, atelectasis, or edema. No pneumothorax.  IMPRESSION: Worsening aeration.   Electronically Signed   By: Staci Righter M.D.   On: 10/23/2016 08:10  Assessment/Plan: S/P Procedure(s) (LRB): CORONARY ARTERY  BYPASS GRAFTING (CABG)x 2 WITH LIMA TO DIAGONAL, OPEN  HARVESTING OF RIGHT SAPHENOUS VEIN FOR VEIN GRAFT TO LAD (N/A) AORTIC VALVE REPLACEMENT (AVR) WITH SIZE 25 MM MAGNA EASE PERICARDIAL BIOPROSTHESIS - AORTIC (N/A) TRANSESOPHAGEAL ECHOCARDIOGRAM (TEE) (N/A) MITRAL VALVE REPAIR (MVR) WITH SIZE 30 SORIN ANNULOFLEX ANNULOPLASTY RING WITH SUBSEQUENT REMOVAL OF RING (N/A) ASCENDING AORTIC  ANEURYSM REPAIR (AAA) WITH 28 MM HEMASHIELD PLATINUM WOVEN DOUBLE VELOUR VASCULAR GRAFT  Overall stable overnight Maintaining stable rhythm and hemodynamics w/ relatively low PA pressures on milrinone @ 0.375, levophed @ 9, Neo @ 30, vasopressin @ 0.3 and nitric oxide @ 30 ppm Oxygenation remarkably good w/ PaO2 174 and O2 sats 100% on 50%  FiO2 but still on APRV mode ventilation  Chest tube output has remained very low, coagulopathy resolved Expected post op acute blood loss anemia, mild, Hgb 11.2 this morning Post op thrombocytopenia, mild, platelet count 119k this morning Low grade fever w/ normal WBC - likely reactive   Will try to switch vent mode to [facility] and wean continuous airway pressures - consider return to OR for sternal washout and closure once vent status improved  Start lasix drip to stimulate diuresis, but patient has severe diastolic dysfunction and remains preload dependent - hopefully cardiac output and diuresis will improve if he remains stable w/ lower vent support  Continue nitric oxide 30 ppm for now - consider weaning to 20 ppm if he does well and PA pressures remain relatively low w/ decreased vent support  Continue milrinone  Wean levophed and neo based on MAP  Watch Hgb, platelet count, renal function and electrolytes closely  SCD boots for DVT prophylaxis   Rexene Alberts, MD 10/23/2016 8:36 AM

## 2016-10-24 ENCOUNTER — Inpatient Hospital Stay (HOSPITAL_COMMUNITY): Payer: Medicare Other

## 2016-10-24 DIAGNOSIS — I9789 Other postprocedural complications and disorders of the circulatory system, not elsewhere classified: Secondary | ICD-10-CM

## 2016-10-24 DIAGNOSIS — I4891 Unspecified atrial fibrillation: Secondary | ICD-10-CM

## 2016-10-24 HISTORY — DX: Other postprocedural complications and disorders of the circulatory system, not elsewhere classified: I48.91

## 2016-10-24 LAB — POCT I-STAT 3, ART BLOOD GAS (G3+)
Bicarbonate: 24.4 mmol/L (ref 20.0–28.0)
O2 SAT: 99 %
PCO2 ART: 39.9 mmHg (ref 32.0–48.0)
Patient temperature: 38.6
TCO2: 26 mmol/L (ref 0–100)
pH, Arterial: 7.401 (ref 7.350–7.450)
pO2, Arterial: 155 mmHg — ABNORMAL HIGH (ref 83.0–108.0)

## 2016-10-24 LAB — CBC
HEMATOCRIT: 30.2 % — AB (ref 39.0–52.0)
HEMOGLOBIN: 10.5 g/dL — AB (ref 13.0–17.0)
MCH: 29.4 pg (ref 26.0–34.0)
MCHC: 34.8 g/dL (ref 30.0–36.0)
MCV: 84.6 fL (ref 78.0–100.0)
Platelets: 89 10*3/uL — ABNORMAL LOW (ref 150–400)
RBC: 3.57 MIL/uL — ABNORMAL LOW (ref 4.22–5.81)
RDW: 14.8 % (ref 11.5–15.5)
WBC: 10.3 10*3/uL (ref 4.0–10.5)

## 2016-10-24 LAB — GLUCOSE, CAPILLARY
GLUCOSE-CAPILLARY: 107 mg/dL — AB (ref 65–99)
GLUCOSE-CAPILLARY: 112 mg/dL — AB (ref 65–99)
GLUCOSE-CAPILLARY: 115 mg/dL — AB (ref 65–99)
GLUCOSE-CAPILLARY: 120 mg/dL — AB (ref 65–99)
GLUCOSE-CAPILLARY: 111 mg/dL — AB (ref 65–99)
GLUCOSE-CAPILLARY: 154 mg/dL — AB (ref 65–99)
GLUCOSE-CAPILLARY: 169 mg/dL — AB (ref 65–99)
GLUCOSE-CAPILLARY: 154 mg/dL — AB (ref 65–99)
GLUCOSE-CAPILLARY: 139 mg/dL — AB (ref 65–99)
GLUCOSE-CAPILLARY: 122 mg/dL — AB (ref 65–99)
Glucose-Capillary: 120 mg/dL — ABNORMAL HIGH (ref 65–99)
Glucose-Capillary: 105 mg/dL — ABNORMAL HIGH (ref 65–99)
Glucose-Capillary: 114 mg/dL — ABNORMAL HIGH (ref 65–99)
Glucose-Capillary: 115 mg/dL — ABNORMAL HIGH (ref 65–99)
Glucose-Capillary: 124 mg/dL — ABNORMAL HIGH (ref 65–99)
Glucose-Capillary: 123 mg/dL — ABNORMAL HIGH (ref 65–99)
Glucose-Capillary: 124 mg/dL — ABNORMAL HIGH (ref 65–99)
Glucose-Capillary: 146 mg/dL — ABNORMAL HIGH (ref 65–99)
Glucose-Capillary: 138 mg/dL — ABNORMAL HIGH (ref 65–99)

## 2016-10-24 LAB — COMPREHENSIVE METABOLIC PANEL
ALBUMIN: 2.8 g/dL — AB (ref 3.5–5.0)
ALK PHOS: 39 U/L (ref 38–126)
ALT: 1072 U/L — ABNORMAL HIGH (ref 17–63)
AST: 1215 U/L — AB (ref 15–41)
Anion gap: 8 (ref 5–15)
BILIRUBIN TOTAL: 0.9 mg/dL (ref 0.3–1.2)
BUN: 16 mg/dL (ref 6–20)
CO2: 23 mmol/L (ref 22–32)
Calcium: 8.1 mg/dL — ABNORMAL LOW (ref 8.9–10.3)
Chloride: 110 mmol/L (ref 101–111)
Creatinine, Ser: 0.97 mg/dL (ref 0.61–1.24)
GFR calc Af Amer: >60 mL/min (ref >60)
GFR calc non Af Amer: >60 mL/min (ref >60)
GLUCOSE: 105 mg/dL — AB (ref 65–99)
POTASSIUM: 3.7 mmol/L (ref 3.5–5.1)
SODIUM: 141 mmol/L (ref 135–145)
TOTAL PROTEIN: 4.5 g/dL — AB (ref 6.5–8.1)

## 2016-10-24 LAB — CARBOXYHEMOGLOBIN - COOX: CARBOXYHEMOGLOBIN: 1.2 % (ref 0.5–1.5)

## 2016-10-24 MED ORDER — SODIUM CHLORIDE 0.9 % IV SOLN
30.0000 meq | Freq: Once | INTRAVENOUS | Status: AC
  Administered 2016-10-24: 30 meq via INTRAVENOUS
  Filled 2016-10-24: qty 15

## 2016-10-24 MED ORDER — PRO-STAT SUGAR FREE PO LIQD
30.0000 mL | Freq: Two times a day (BID) | ORAL | Status: DC
  Administered 2016-10-24 – 2016-10-25 (×4): 30 mL
  Filled 2016-10-24 (×3): qty 30

## 2016-10-24 MED ORDER — AMIODARONE HCL IN DEXTROSE 360-4.14 MG/200ML-% IV SOLN
60.0000 mg/h | INTRAVENOUS | Status: AC
  Administered 2016-10-24: 60 mg/h via INTRAVENOUS

## 2016-10-24 MED ORDER — AMIODARONE HCL IN DEXTROSE 360-4.14 MG/200ML-% IV SOLN
30.0000 mg/h | INTRAVENOUS | Status: DC
  Administered 2016-10-24 – 2016-10-26 (×4): 30 mg/h via INTRAVENOUS
  Filled 2016-10-24 (×2): qty 200

## 2016-10-24 MED ORDER — AMIODARONE HCL IN DEXTROSE 360-4.14 MG/200ML-% IV SOLN
INTRAVENOUS | Status: AC
  Filled 2016-10-24: qty 200

## 2016-10-24 MED ORDER — VITAL HIGH PROTEIN PO LIQD
1000.0000 mL | ORAL | Status: DC
  Administered 2016-10-24: 1000 mL
  Administered 2016-10-24 – 2016-10-25 (×2)
  Administered 2016-10-25: 1000 mL

## 2016-10-24 MED ORDER — ALBUMIN HUMAN 5 % IV SOLN
12.5000 g | Freq: Once | INTRAVENOUS | Status: AC
  Administered 2016-10-24: 12.5 g via INTRAVENOUS

## 2016-10-24 NOTE — Progress Notes (Signed)
Nutrition Consult/Follow Up  DOCUMENTATION CODES:   Obesity unspecified  INTERVENTION:    Initiate Vital High Protein at goal rate of 55 ml/h (1320 ml per day) and Prostat 30 ml BID to provide 1520 kcals, 145 gm protein, 1103 ml free water daily  NUTRITION DIAGNOSIS:   Inadequate oral intake related to inability to eat as evidenced by NPO status, ongoing  GOAL:    Provide needs based on ASPEN/SCCM guidelines, progressing  MONITOR:   TF tolerance, Vent status, Labs, Weight trends, Skin, I & O's  ASSESSMENT:   70 yo former smoker with known CAD and ascending aorta aneurysm, who was taken to OR 11/30 for cabg x 3, aortic root replacement, MVR replacement, thoracic cavity left open and had extended pump time in OR. On APRV ventilation post op, CI 1.6 and multiple pressors. PCCM asked to manage vent.   Pt s/p procedures 11/30: AORTIC VALVE REPLACEMENT MITRAL VALVE REPAIR REPAIR OF ASCENDING THORACIC AORTIC ANEURYSM CORONARY ARTERY BYPASS GRAFTING x 3  Patient is currently intubated on ventilator support Temp (24hrs), Avg:101.5 F (38.6 C), Min:100 F (37.8 C), Max:102 F (38.9 C)  Pt was on a Heart Healthy/Carbohydrate Modified diet prior to surgery. PO intake was good at 100% per flowsheet records. Labs and medications reviewed. CBG's [id number]. RD consulted for TF initiation & management.  Diet Order:  Diet NPO time specified  Skin:  Wound (see comment) (sternal wound VAC)  Last BM:  12/2  Height:   Ht Readings from Last 1 Encounters:  10/23/16 5\' 9"  (1.753 m)    Weight:   Wt Readings from Last 1 Encounters:  10/24/16 242 lb 15.2 oz (110.2 kg)    Ideal Body Weight:  72.7 kg  BMI:  Body mass index is 35.88 kg/m.  Estimated Nutritional Needs:   Kcal:  [id number]  Protein:  145-155 gm  Fluid:  per MD  EDUCATION NEEDS:   No education needs identified at this time  Arthur Holms, RD, LDN Pager #: [phone] After-Hours Pager #: [phone]

## 2016-10-24 NOTE — Progress Notes (Signed)
      [address]       [place] [address]             [phone]      Intubated, sedated  BP 116/76   Pulse 89   Temp 99.9 F (37.7 C)   Resp 16   Ht 5\' 9"  (1.753 m)   Wt 242 lb 15.2 oz (110.2 kg)   SpO2 99%   BMI 35.88 kg/m   PA= 36/23, CI= 1.76   Intake/Output Summary (Last 24 hours) at 10/24/16 1733 Last data filed at 10/24/16 1700  Gross per 24 hour  Intake          4040.55 ml  Output             7070 ml  Net         -3029.45 ml   Index is down a little and his ext are cool- PA pressures about the same but more respiratory variation- may be intravascularly dry despite total body volume overload.  Give albumin x 1 to increase intravascular volume Increase milrinone to 0.3  [name], MD [facility] [phone]

## 2016-10-24 NOTE — Progress Notes (Signed)
2 Days Post-Op Procedure(s) (LRB): CORONARY ARTERY BYPASS GRAFTING (CABG)x 2 WITH LIMA TO DIAGONAL, OPEN  HARVESTING OF RIGHT SAPHENOUS VEIN FOR VEIN GRAFT TO LAD (N/A) AORTIC VALVE REPLACEMENT (AVR) WITH SIZE 25 MM MAGNA EASE PERICARDIAL BIOPROSTHESIS - AORTIC (N/A) TRANSESOPHAGEAL ECHOCARDIOGRAM (TEE) (N/A) MITRAL VALVE REPAIR (MVR) WITH SIZE 30 SORIN ANNULOFLEX ANNULOPLASTY RING WITH SUBSEQUENT REMOVAL OF RING (N/A) ASCENDING AORTIC  ANEURYSM REPAIR (AAA) WITH 28 MM HEMASHIELD PLATINUM WOVEN DOUBLE VELOUR VASCULAR GRAFT Subjective: Intubated, sedated Per RN moves all 4 and follows commands when sedation lightened  Objective: Vital signs in last 24 hours: Temp:  [100 F (37.8 C)-102 F (38.9 C)] 102 F (38.9 C) (12/02 1000) Pulse Rate:  [88-91] 91 (12/02 1000) Cardiac Rhythm: Atrial paced (12/02 0800) Resp:  [9-25] 16 (12/02 1000) BP: (80-126)/(54-82) 112/71 (12/02 1000) SpO2:  [98 %-100 %] 100 % (12/02 1000) Arterial Line BP: (88-155)/(52-106) 114/59 (12/02 1000) FiO2 (%):  [50 %] 50 % (12/02 0805) Weight:  [242 lb 15.2 oz (110.2 kg)] 242 lb 15.2 oz (110.2 kg) (12/02 0500)  Hemodynamic parameters for last 24 hours: PAP: (25-49)/(16-23) 28/18 CO:  [3.4 L/min-4.4 L/min] 4.2 L/min CI:  [1.7 L/min/m2-2.1 L/min/m2] 2 L/min/m2  Intake/Output from previous day: 12/01 0701 - 12/02 0700 In: 3282 [I.V.:3162; NG/GT:120] Out: 6950 [Urine:5550; Emesis/NG output:600; Drains:70; Chest Tube:730] Intake/Output this shift: Total I/O In: 637.9 [I.V.:337.9; NG/GT:30; IV Piggyback:270] Out: 1105 [Urine:755; Emesis/NG output:200; Chest Tube:150]  General appearance: sedated Heart: irregularly irregular rhythm and under pacemaker Lungs: clear to auscultation bilaterally Abdomen: soft, minimal BS Extremities: well perfused Wound: open with VAC in place  Lab Results:  Recent Labs  10/23/16 1615 10/24/16 0510  WBC 8.1 10.3  HGB 10.3* 10.5*  HCT 29.6* 30.2*  PLT 87* 89*   BMET:   Recent Labs  10/23/16 0400 10/23/16 1558 10/23/16 1615 10/24/16 0510  NA 141 144  --  141  K 4.2 4.2  --  3.7  CL 114* 110  --  110  CO2 18*  --   --  23  GLUCOSE 142* 97  --  105*  BUN 12 14  --  16  CREATININE 1.01 0.90 1.02 0.97  CALCIUM 8.5*  --   --  8.1*    PT/INR:  Recent Labs  10/22/16 2155  LABPROT 20.6*  INR 1.74   ABG    Component Value Date/Time   PHART 7.401 10/24/2016 0514   HCO3 24.4 10/24/2016 0514   TCO2 26 10/24/2016 0514   ACIDBASEDEF 6.0 (H) 10/23/2016 1001   O2SAT 99.0 10/24/2016 0514   CBG (last 3)   Recent Labs  10/24/16 0805 10/24/16 0912 10/24/16 1005  GLUCAP 114* 112* 115*    Assessment/Plan: S/P Procedure(s) (LRB): CORONARY ARTERY BYPASS GRAFTING (CABG)x 2 WITH LIMA TO DIAGONAL, OPEN  HARVESTING OF RIGHT SAPHENOUS VEIN FOR VEIN GRAFT TO LAD (N/A) AORTIC VALVE REPLACEMENT (AVR) WITH SIZE 25 MM MAGNA EASE PERICARDIAL BIOPROSTHESIS - AORTIC (N/A) TRANSESOPHAGEAL ECHOCARDIOGRAM (TEE) (N/A) MITRAL VALVE REPAIR (MVR) WITH SIZE 30 SORIN ANNULOFLEX ANNULOPLASTY RING WITH SUBSEQUENT REMOVAL OF RING (N/A) ASCENDING AORTIC  ANEURYSM REPAIR (AAA) WITH 28 MM HEMASHIELD PLATINUM WOVEN DOUBLE VELOUR VASCULAR GRAFT -Remains critically ill but relatively stable  CV- good cardiac output with current drips, titrating levophed to BP  Filling pressures OK  Atrial fib under pacer- will start amiodarone   RESP- VDRf on PRVC, will try decreasing PEEP to 5 this AM as there is no ongoing bleeding  RENAL- continuing to diurese with low  dose lasix drip  Lytes OK  ENDO- CBG well controlled  GI transaminases markedly elevated c/w shock liver- follow  ID- febrile to 102  Blood cultures sent yesterday   LOS: 4 days    Melrose Nakayama 10/24/2016

## 2016-10-24 NOTE — Progress Notes (Signed)
PULMONARY / CRITICAL CARE MEDICINE   Name: Scott [name] MRN: [id number] DOB: 20-Jan-1946    ADMISSION DATE:  10/20/2016 CONSULTATION DATE: 12/1  REFERRING MD: Ricard Dillon  CHIEF COMPLAINT:  CAD  HISTORY OF PRESENT ILLNESS:   70 yo former smoker with known CAD and ascending aorta aneurysm, who was taken to OR 11/30 for cabg x 3, aortic root replacement, MVR replacement, thoracic cavity left open and had extended pump time in OR. On APRV ventilation post op, CI 1.6 and multiple pressors. PCCM asked to manage vent.  SUBJECTIVE:  Sedated on vent  VITAL SIGNS: BP 113/77   Pulse 89   Temp (!) 101.7 F (38.7 C)   Resp 16   Ht 5\' 9"  (1.753 m)   Wt 242 lb 15.2 oz (110.2 kg)   SpO2 100%   BMI 35.88 kg/m   HEMODYNAMICS: PAP: (25-49)/(17-28) 32/18 CO:  [2.8 L/min-4.4 L/min] 3.9 L/min CI:  [1.3 L/min/m2-2.1 L/min/m2] 1.8 L/min/m2  VENTILATOR SETTINGS: Vent Mode: PRVC FiO2 (%):  [50 %] 50 % Set Rate:  [15 bmp-16 bmp] 16 bmp Vt Set:  [570 mL] 570 mL PEEP:  [8 cmH20-10 cmH20] 8 cmH20 Plateau Pressure:  [18 cmH20-22 cmH20] 18 cmH20  INTAKE / OUTPUT: I/O last 3 completed shifts: In: 13081.6 [I.V.:7709.6; BT:8761234; NG/GT:210; IV Piggyback:1100] Out: [id number] Cyriacus.Fent; Emesis/NG output:700; Drains:420; Blood:10500; Chest Tube:1010]  PHYSICAL EXAMINATION: General:  WNWDWM, sedated on vent Neuro: Arouses and follows commands at times HEENT: Scleral edema , PERL 3 mm Cardiovascular:  HSR M, open chest wound with wound vac Lungs:  Decreased bs thru out Abdomen:  Soft  Musculoskeletal:  intact Skin: cool dry  LABS:  BMET  Recent Labs Lab 10/22/16 0552  10/23/16 0400 10/23/16 1558 10/23/16 1615 10/24/16 0510  NA 137  < > 141 144  --  141  K 4.1  < > 4.2 4.2  --  3.7  CL 107  < > 114* 110  --  110  CO2 19*  --  18*  --   --  23  BUN 14  < > 12 14  --  16  CREATININE 0.79  < > 1.01 0.90 1.02 0.97  GLUCOSE 138*  < > 142* 97  --  105*  < > = values in this interval not  displayed.  Electrolytes  Recent Labs Lab 10/22/16 0552 10/23/16 0400 10/23/16 1615 10/24/16 0510  CALCIUM 9.4 8.5*  --  8.1*  MG  --  2.5* 2.0  --     CBC  Recent Labs Lab 10/23/16 0400 10/23/16 1558 10/23/16 1615 10/24/16 0510  WBC 9.6  --  8.1 10.3  HGB 11.2* 8.8* 10.3* 10.5*  HCT 32.0* 26.0* 29.6* 30.2*  PLT 119*  --  87* 89*    Coag's  Recent Labs Lab 10/19/16 1123 10/22/16 2104 10/22/16 2155  APTT 34 56* 47*  INR 1.1 1.85 1.74    Sepsis Markers No results for input(s): LATICACIDVEN, PROCALCITON, O2SATVEN in the last 168 hours.  ABG  Recent Labs Lab 10/23/16 0508 10/23/16 1001 10/24/16 0514  PHART 7.453* 7.328* 7.401  PCO2ART 27.2* 36.3 39.9  PO2ART 173.0* 124.0* 155.0*    Liver Enzymes  Recent Labs Lab 10/21/16 0217 10/24/16 0510  AST 28 1,215*  ALT 22 1,072*  ALKPHOS 62 39  BILITOT 0.6 0.9  ALBUMIN 3.6 2.8*    Cardiac Enzymes No results for input(s): TROPONINI, PROBNP in the last 168 hours.  Glucose  Recent Labs Lab 10/23/16 2206 10/23/16 2254 10/23/16  2359 10/24/16 0154 10/24/16 0400 10/24/16 0603  GLUCAP 101* 85 106* 120* 107* 105*    Imaging No results found.   STUDIES:    CULTURES:   ANTIBIOTICS: 11/30 levaquin>>  SIGNIFICANT EVENTS: 11/30 OHS>>  LINES/TUBES: 11.30 OTT>> 11/30 rt I j PA cath>> 11/30 l rad aline>> 11/30 CT x 4>> 11/30 wound vac>>  DISCUSSION: 70 yo former smoker with known CAD and ascending aorta aneurysm, who was taken to OR 11/30 for cabg x 3, aortic root replacement, MVR replacement, thoracic cavity left open and had extended pump time in OR. On APRV ventilation post op, CI 1.6 and multiple pressors. PCCM asked to manage vent.  ASSESSMENT / PLAN:  PULMONARY A: VDRF secondary to extensive OHS 11/30,  prolonged pump time Former smoker P:   Transition from APRV ventilation to [facility] and tolerating well Vent bundle Follow c x r Keep sedated till sternum  closed  CARDIOVASCULAR A:  Post cabg x 3, MVR, ascending aortic root repalcement Open chest wound Hypotension CAD CI 1.6->2.1 P:  Wean pressor as possible   RENAL Lab Results  Component Value Date   CREATININE 0.97 10/24/2016   CREATININE 1.02 10/23/2016   CREATININE 0.90 10/23/2016   CREATININE 0.87 10/19/2016    A:   NO acute issue P:   Rack renal function and lytes  GASTROINTESTINAL A:   Hx of Gerd  P:   PPI  HEMATOLOGIC  Recent Labs  10/23/16 1615 10/24/16 0510  HGB 10.3* 10.5*    A:   No acute issues Mild thrombocytopenia  P:  Trend  hematology labs   INFECTIOUS A:   Open chest cavity  P:   Levaquin per cvts Day #2/x  ENDOCRINE CBG (last 3)   Recent Labs  10/24/16 0154 10/24/16 0400 10/24/16 0603  GLUCAP 120* 107* 105*     A:   DM P:   SSI Insulin drip  NEUROLOGIC A:   Post OHS with prolonged pump time 11/30 Awake and follows commands 12/1 P:   RASS goal: -1 Sedate for tube tolerance and with open chest cavitary.   FAMILY  - Updates: None at bedside  - Inter-disciplinary family meet or Palliative Care meeting due by: 12/8   CCT=45 min   [name] ACNP Maryanna Shape PCCM Pager [phone] till 3 pm If no answer page [phone] 10/24/2016, 7:16 AM  Attending Note:  70 year old male, s/p MVR, CABG, aortic root replacement and now with an open left cavity and long pump time.  The patient is currently on PRVC and nitric with an improving PaO2, decrease PEEP to 5.  On exam, coarse BS diffusely and patient is arousable.  I reviewed CXR myself, ETT in good position, right sided chest tube and swan in place.  F/U ABG with high PEEP.  Continue nitric.  Continue precedex for sedation.  Titrate milrinone, norepi, vaso and neo for hemodynamic support.  Hold off any weaning until chest is closed for now.  Consult nutrition for TF as per nutrition.  Lasix drip at 4 mg/hr.  AM labs ordered.  The patient is critically ill with multiple  organ systems failure and requires high complexity decision making for assessment and support, frequent evaluation and titration of therapies, application of advanced monitoring technologies and extensive interpretation of multiple databases.   Critical Care Time devoted to patient care services described in this note is  35  Minutes. This time reflects time of care of this signee Dr Jennet Maduro. This critical care  time does not reflect procedure time, or teaching time or supervisory time of PA/NP/Med student/Med Resident etc but could involve care discussion time.  Rush Farmer, M.D. [facility] Pulmonary/Critical Care Medicine. Pager: [phone]. After hours pager: [phone].

## 2016-10-24 NOTE — Progress Notes (Signed)
Amiodarone Drug - Drug Interaction Consult Note  Recommendations: Monitor for increased risk of cardiotoxicity if hypokalemia occurs on amio + lasix drip. Also noted on beta blocker, metformin outpatient (not currently ordered inpatient.)  Amiodarone is metabolized by the cytochrome P450 system and therefore has the potential to cause many drug interactions. Amiodarone has an average plasma half-life of 50 days (range 20 to 100 days).   There is potential for drug interactions to occur several weeks or months after stopping treatment and the onset of drug interactions may be slow after initiating amiodarone.   []  Statins: Increased risk of myopathy. Simvastatin- restrict dose to 20mg   daily. Other statins: counsel patients to report any muscle pain or weakness immediately.  []  Anticoagulants: Amiodarone can increase anticoagulant effect. Consider warfarin dose reduction. Patients should be monitored closely and the dose of anticoagulant altered accordingly, remembering that amiodarone levels take several weeks to stabilize.  []  Antiepileptics: Amiodarone can increase plasma concentration of phenytoin, the dose should be reduced. Note that small changes in phenytoin dose can result in large changes in levels. Monitor patient and counsel on signs of toxicity.  []  Beta blockers: increased risk of bradycardia, AV block and myocardial depression. Sotalol - avoid concomitant use.  []   Calcium channel blockers (diltiazem and verapamil): increased risk of bradycardia, AV block and myocardial depression.  []   Cyclosporine: Amiodarone increases levels of cyclosporine. Reduced dose of cyclosporine is recommended.  []  Digoxin dose should be halved when amiodarone is started.  [x]  Diuretics: increased risk of cardiotoxicity if hypokalemia occurs.  Lasix Drip   []  Oral hypoglycemic agents (glyburide, glipizide, glimepiride): increased risk of hypoglycemia. Patient's glucose levels should be monitored  closely when initiating amiodarone therapy.   []  Drugs that prolong the QT interval:  Torsades de pointes risk may be increased with concurrent use - avoid if possible.  Monitor QTc, also keep magnesium/potassium WNL if concurrent therapy can't be avoided. Marland Kitchen Antibiotics: e.g. fluoroquinolones, erythromycin. . Antiarrhythmics: e.g. quinidine, procainamide, disopyramide, sotalol. . Antipsychotics: e.g. phenothiazines, haloperidol.  . Lithium, tricyclic antidepressants, and methadone. Thank You,    Elicia Lamp, PharmD, BCPS Clinical Pharmacist 10/24/2016 10:32 AM

## 2016-10-25 ENCOUNTER — Inpatient Hospital Stay (HOSPITAL_COMMUNITY): Payer: Medicare Other

## 2016-10-25 DIAGNOSIS — E876 Hypokalemia: Secondary | ICD-10-CM

## 2016-10-25 LAB — TYPE AND SCREEN
BLOOD PRODUCT EXPIRATION DATE: 201712182359
BLOOD PRODUCT EXPIRATION DATE: 201712182359
BLOOD PRODUCT EXPIRATION DATE: 201712182359
BLOOD PRODUCT EXPIRATION DATE: 201712182359
Blood Product Expiration Date: 201712182359
Blood Product Expiration Date: 201712182359
Blood Product Expiration Date: 201712182359
Blood Product Expiration Date: 201712182359
Blood Product Expiration Date: 201712192359
Blood Product Expiration Date: 201712192359
ISSUE DATE / TIME: 201711301520
ISSUE DATE / TIME: 201711301520
ISSUE DATE / TIME: 201711301637
ISSUE DATE / TIME: 201711301637
ISSUE DATE / TIME: 201711301637
ISSUE DATE / TIME: 201711301637
ISSUE DATE / TIME: 201711302128
ISSUE DATE / TIME: 201711302128
UNIT TYPE AND RH: 5100
UNIT TYPE AND RH: 5100
UNIT TYPE AND RH: 5100
UNIT TYPE AND RH: 5100
Unit Type and Rh: 5100
Unit Type and Rh: 5100
Unit Type and Rh: 5100
Unit Type and Rh: 5100
Unit Type and Rh: 5100
Unit Type and Rh: 5100

## 2016-10-25 LAB — POCT I-STAT 3, ART BLOOD GAS (G3+)
ACID-BASE EXCESS: 3 mmol/L — AB (ref 0.0–2.0)
Bicarbonate: 28 mmol/L (ref 20.0–28.0)
O2 SAT: 98 %
PH ART: 7.412 (ref 7.350–7.450)
TCO2: 29 mmol/L (ref 0–100)
pCO2 arterial: 44.5 mmHg (ref 32.0–48.0)
pO2, Arterial: 112 mmHg — ABNORMAL HIGH (ref 83.0–108.0)

## 2016-10-25 LAB — GLUCOSE, CAPILLARY
GLUCOSE-CAPILLARY: 103 mg/dL — AB (ref 65–99)
GLUCOSE-CAPILLARY: 109 mg/dL — AB (ref 65–99)
GLUCOSE-CAPILLARY: 116 mg/dL — AB (ref 65–99)
GLUCOSE-CAPILLARY: 116 mg/dL — AB (ref 65–99)
GLUCOSE-CAPILLARY: 124 mg/dL — AB (ref 65–99)
GLUCOSE-CAPILLARY: 124 mg/dL — AB (ref 65–99)
GLUCOSE-CAPILLARY: 130 mg/dL — AB (ref 65–99)
GLUCOSE-CAPILLARY: 142 mg/dL — AB (ref 65–99)
GLUCOSE-CAPILLARY: 145 mg/dL — AB (ref 65–99)
GLUCOSE-CAPILLARY: 160 mg/dL — AB (ref 65–99)
GLUCOSE-CAPILLARY: 184 mg/dL — AB (ref 65–99)
Glucose-Capillary: 142 mg/dL — ABNORMAL HIGH (ref 65–99)
Glucose-Capillary: 98 mg/dL (ref 65–99)
Glucose-Capillary: 156 mg/dL — ABNORMAL HIGH (ref 65–99)
Glucose-Capillary: 124 mg/dL — ABNORMAL HIGH (ref 65–99)
Glucose-Capillary: 177 mg/dL — ABNORMAL HIGH (ref 65–99)
Glucose-Capillary: 192 mg/dL — ABNORMAL HIGH (ref 65–99)
Glucose-Capillary: 147 mg/dL — ABNORMAL HIGH (ref 65–99)
Glucose-Capillary: 132 mg/dL — ABNORMAL HIGH (ref 65–99)
Glucose-Capillary: 165 mg/dL — ABNORMAL HIGH (ref 65–99)
Glucose-Capillary: 116 mg/dL — ABNORMAL HIGH (ref 65–99)
Glucose-Capillary: 113 mg/dL — ABNORMAL HIGH (ref 65–99)

## 2016-10-25 LAB — CBC
HCT: 27.5 % — ABNORMAL LOW (ref 39.0–52.0)
HEMATOCRIT: 25.7 % — AB (ref 39.0–52.0)
HEMOGLOBIN: 9.5 g/dL — AB (ref 13.0–17.0)
Hemoglobin: 8.6 g/dL — ABNORMAL LOW (ref 13.0–17.0)
MCH: 29.7 pg (ref 26.0–34.0)
MCH: 29.2 pg (ref 26.0–34.0)
MCHC: 34.5 g/dL (ref 30.0–36.0)
MCHC: 33.5 g/dL (ref 30.0–36.0)
MCV: 85.9 fL (ref 78.0–100.0)
MCV: 87.1 fL (ref 78.0–100.0)
PLATELETS: 54 10*3/uL — AB (ref 150–400)
Platelets: 50 10*3/uL — ABNORMAL LOW (ref 150–400)
RBC: 3.2 MIL/uL — ABNORMAL LOW (ref 4.22–5.81)
RBC: 2.95 MIL/uL — ABNORMAL LOW (ref 4.22–5.81)
RDW: 14.6 % (ref 11.5–15.5)
RDW: 14.9 % (ref 11.5–15.5)
WBC: 5.3 10*3/uL (ref 4.0–10.5)
WBC: 4.5 10*3/uL (ref 4.0–10.5)

## 2016-10-25 LAB — PROTIME-INR
INR: 2.15
INR: 1.88
Prothrombin Time: 24.3 seconds — ABNORMAL HIGH (ref 11.4–15.2)
Prothrombin Time: 21.9 seconds — ABNORMAL HIGH (ref 11.4–15.2)

## 2016-10-25 LAB — COMPREHENSIVE METABOLIC PANEL
ALBUMIN: 2.4 g/dL — AB (ref 3.5–5.0)
ALK PHOS: 49 U/L (ref 38–126)
ALT: 913 U/L — AB (ref 17–63)
ANION GAP: 7 (ref 5–15)
AST: 643 U/L — AB (ref 15–41)
BUN: 20 mg/dL (ref 6–20)
CALCIUM: 7.8 mg/dL — AB (ref 8.9–10.3)
CO2: 27 mmol/L (ref 22–32)
Chloride: 107 mmol/L (ref 101–111)
Creatinine, Ser: 0.8 mg/dL (ref 0.61–1.24)
GFR calc Af Amer: >60 mL/min (ref >60)
GFR calc non Af Amer: >60 mL/min (ref >60)
GLUCOSE: 137 mg/dL — AB (ref 65–99)
Potassium: 3.2 mmol/L — ABNORMAL LOW (ref 3.5–5.1)
SODIUM: 141 mmol/L (ref 135–145)
Total Bilirubin: 0.9 mg/dL (ref 0.3–1.2)
Total Protein: 4.4 g/dL — ABNORMAL LOW (ref 6.5–8.1)

## 2016-10-25 LAB — PHOSPHORUS: Phosphorus: 1.9 mg/dL — ABNORMAL LOW (ref 2.5–4.6)

## 2016-10-25 LAB — CARBOXYHEMOGLOBIN - COOX: CARBOXYHEMOGLOBIN: 1.3 % (ref 0.5–1.5)

## 2016-10-25 LAB — APTT: aPTT: 43 seconds — ABNORMAL HIGH (ref 24–36)

## 2016-10-25 LAB — MAGNESIUM: Magnesium: 1.7 mg/dL (ref 1.7–2.4)

## 2016-10-25 MED ORDER — SODIUM CHLORIDE 0.9 % IV SOLN
INTRAVENOUS | Status: DC
  Filled 2016-10-25: qty 2.5

## 2016-10-25 MED ORDER — DEXTROSE 5 % IV SOLN
0.0000 ug/min | INTRAVENOUS | Status: DC
  Filled 2016-10-25: qty 4

## 2016-10-25 MED ORDER — IPRATROPIUM-ALBUTEROL 0.5-2.5 (3) MG/3ML IN SOLN: 3.0000 mL | RESPIRATORY_TRACT | Status: DC | PRN

## 2016-10-25 MED ORDER — POTASSIUM CHLORIDE 2 MEQ/ML IV SOLN
80.0000 meq | INTRAVENOUS | Status: DC
  Filled 2016-10-25: qty 40

## 2016-10-25 MED ORDER — TRANEXAMIC ACID (OHS) PUMP PRIME SOLUTION
2.0000 mg/kg | INTRAVENOUS | Status: DC
  Filled 2016-10-25: qty 2.16

## 2016-10-25 MED ORDER — POTASSIUM CHLORIDE 20 MEQ/15ML (10%) PO SOLN
ORAL | Status: AC
  Filled 2016-10-25: qty 30

## 2016-10-25 MED ORDER — DOPAMINE-DEXTROSE 3.2-5 MG/ML-% IV SOLN
0.0000 ug/kg/min | INTRAVENOUS | Status: DC
  Filled 2016-10-25: qty 250

## 2016-10-25 MED ORDER — AMIODARONE HCL IN DEXTROSE 360-4.14 MG/200ML-% IV SOLN
INTRAVENOUS | Status: AC
  Filled 2016-10-25: qty 200

## 2016-10-25 MED ORDER — CHLORHEXIDINE GLUCONATE CLOTH 2 % EX PADS
6.0000 | MEDICATED_PAD | Freq: Once | CUTANEOUS | Status: AC
  Administered 2016-10-26: 6 via TOPICAL

## 2016-10-25 MED ORDER — LEVOFLOXACIN IN D5W 500 MG/100ML IV SOLN
500.0000 mg | INTRAVENOUS | Status: AC
  Administered 2016-10-26: 500 mg via INTRAVENOUS
  Filled 2016-10-25 (×2): qty 100

## 2016-10-25 MED ORDER — DEXMEDETOMIDINE HCL IN NACL 400 MCG/100ML IV SOLN
0.1000 ug/kg/h | INTRAVENOUS | Status: DC
  Filled 2016-10-25 (×2): qty 100

## 2016-10-25 MED ORDER — MAGNESIUM SULFATE 50 % IJ SOLN
40.0000 meq | INTRAMUSCULAR | Status: DC
  Filled 2016-10-25: qty 10

## 2016-10-25 MED ORDER — CHLORHEXIDINE GLUCONATE CLOTH 2 % EX PADS
6.0000 | MEDICATED_PAD | Freq: Once | CUTANEOUS | Status: AC
  Administered 2016-10-25: 6 via TOPICAL

## 2016-10-25 MED ORDER — CHLORHEXIDINE GLUCONATE 0.12 % MT SOLN
15.0000 mL | Freq: Once | OROMUCOSAL | Status: AC
  Administered 2016-10-26: 15 mL via OROMUCOSAL

## 2016-10-25 MED ORDER — NITROGLYCERIN IN D5W 200-5 MCG/ML-% IV SOLN
2.0000 ug/min | INTRAVENOUS | Status: DC
  Filled 2016-10-25: qty 250

## 2016-10-25 MED ORDER — SODIUM CHLORIDE 0.9 % IV SOLN
INTRAVENOUS | Status: AC
  Administered 2016-10-26: 1000 mL
  Filled 2016-10-25: qty 1000

## 2016-10-25 MED ORDER — SODIUM CHLORIDE 0.9 % IV SOLN
Freq: Once | INTRAVENOUS | Status: AC
Start: 2016-10-25 — End: 2016-10-25

## 2016-10-25 MED ORDER — PHENYLEPHRINE HCL 10 MG/ML IJ SOLN
30.0000 ug/min | INTRAMUSCULAR | Status: DC
  Filled 2016-10-25: qty 2

## 2016-10-25 MED ORDER — TRANEXAMIC ACID (OHS) BOLUS VIA INFUSION
15.0000 mg/kg | INTRAVENOUS | Status: DC
  Filled 2016-10-25: qty 1623

## 2016-10-25 MED ORDER — TRANEXAMIC ACID 1000 MG/10ML IV SOLN
1.5000 mg/kg/h | INTRAVENOUS | Status: DC
  Filled 2016-10-25: qty 25

## 2016-10-25 MED ORDER — VANCOMYCIN HCL 10 G IV SOLR
1500.0000 mg | INTRAVENOUS | Status: AC
  Administered 2016-10-26: 1500 mg via INTRAVENOUS
  Filled 2016-10-25: qty 1500

## 2016-10-25 MED ORDER — SODIUM CHLORIDE 0.9 % IV SOLN
30.0000 meq | Freq: Once | INTRAVENOUS | Status: AC
  Administered 2016-10-25: 30 meq via INTRAVENOUS
  Filled 2016-10-25: qty 15

## 2016-10-25 MED ORDER — HEPARIN SODIUM (PORCINE) 1000 UNIT/ML IJ SOLN
INTRAMUSCULAR | Status: DC
  Filled 2016-10-25: qty 30

## 2016-10-25 MED ORDER — SODIUM CHLORIDE 0.9 % IV SOLN: Freq: Once | INTRAVENOUS | Status: DC

## 2016-10-25 MED ORDER — PLASMA-LYTE 148 IV SOLN
INTRAVENOUS | Status: DC
  Filled 2016-10-25: qty 2.5

## 2016-10-25 MED ORDER — POTASSIUM CHLORIDE 20 MEQ/15ML (10%) PO SOLN
40.0000 meq | Freq: Once | ORAL | Status: AC
  Administered 2016-10-25: 40 meq

## 2016-10-25 NOTE — Progress Notes (Signed)
3 Days Post-Op Procedure(s) (LRB): CORONARY ARTERY BYPASS GRAFTING (CABG)x 2 WITH LIMA TO DIAGONAL, OPEN  HARVESTING OF RIGHT SAPHENOUS VEIN FOR VEIN GRAFT TO LAD (N/A) AORTIC VALVE REPLACEMENT (AVR) WITH SIZE 25 MM MAGNA EASE PERICARDIAL BIOPROSTHESIS - AORTIC (N/A) TRANSESOPHAGEAL ECHOCARDIOGRAM (TEE) (N/A) MITRAL VALVE REPAIR (MVR) WITH SIZE 30 SORIN ANNULOFLEX ANNULOPLASTY RING WITH SUBSEQUENT REMOVAL OF RING (N/A) ASCENDING AORTIC  ANEURYSM REPAIR (AAA) WITH 28 MM HEMASHIELD PLATINUM WOVEN DOUBLE VELOUR VASCULAR GRAFT Subjective: Intubated, sedated  Objective: Vital signs in last 24 hours: Temp:  [99.7 F (37.6 C)-102 F (38.9 C)] 100.4 F (38 C) (12/03 0900) Pulse Rate:  [88-104] 89 (12/03 0900) Cardiac Rhythm: A-V Sequential paced (12/03 0800) Resp:  [14-19] 16 (12/03 0900) BP: (88-140)/(59-80) 98/66 (12/03 0900) SpO2:  [91 %-100 %] 100 % (12/03 0900) Arterial Line BP: (89-186)/(55-79) 111/56 (12/03 0900) FiO2 (%):  [40 %-50 %] 40 % (12/03 0800) Weight:  [238 lb 8.6 oz (108.2 kg)] 238 lb 8.6 oz (108.2 kg) (12/03 0500)  Hemodynamic parameters for last 24 hours: PAP: (27-49)/(17-26) 43/22 CO:  [3.4 L/min-4.7 L/min] 4.7 L/min CI:  [1.6 L/min/m2-2.2 L/min/m2] 2.2 L/min/m2  Intake/Output from previous day: 12/02 0701 - 12/03 0700 In: 5328.2 [I.V.:3383.2; NG/GT:1375; IV Piggyback:570] Out: [id number] [Urine:3710; Emesis/NG output:200; Drains:125; Chest Tube:550] Intake/Output this shift: Total I/O In: 686.3 [I.V.:281.3; NG/GT:140; IV Piggyback:265] Out: 350 [Urine:250; Chest Tube:100]  Neurologic: sedated Heart: regular rate and rhythm Lungs: clear to auscultation bilaterally and anteriroly Extremities: warmer, brisk cap refill  Lab Results:  Recent Labs  10/24/16 0510 10/25/16 0500  WBC 10.3 5.3  HGB 10.5* 9.5*  HCT 30.2* 27.5*  PLT 89* 50*   BMET:  Recent Labs  10/24/16 0510 10/25/16 0500  NA 141 141  K 3.7 3.2*  CL 110 107  CO2 23 27  GLUCOSE 105* 137*   BUN 16 20  CREATININE 0.97 0.80  CALCIUM 8.1* 7.8*    PT/INR:  Recent Labs  10/25/16 0500  LABPROT 24.3*  INR 2.15   ABG    Component Value Date/Time   PHART 7.412 10/25/2016 0508   HCO3 28.0 10/25/2016 0508   TCO2 29 10/25/2016 0508   ACIDBASEDEF 6.0 (H) 10/23/2016 1001   O2SAT 98.0 10/25/2016 0508   CBG (last 3)   Recent Labs  10/25/16 0507 10/25/16 0607 10/25/16 0815  GLUCAP 130* 116* 156*    Assessment/Plan: S/P Procedure(s) (LRB): CORONARY ARTERY BYPASS GRAFTING (CABG)x 2 WITH LIMA TO DIAGONAL, OPEN  HARVESTING OF RIGHT SAPHENOUS VEIN FOR VEIN GRAFT TO LAD (N/A) AORTIC VALVE REPLACEMENT (AVR) WITH SIZE 25 MM MAGNA EASE PERICARDIAL BIOPROSTHESIS - AORTIC (N/A) TRANSESOPHAGEAL ECHOCARDIOGRAM (TEE) (N/A) MITRAL VALVE REPAIR (MVR) WITH SIZE 30 SORIN ANNULOFLEX ANNULOPLASTY RING WITH SUBSEQUENT REMOVAL OF RING (N/A) ASCENDING AORTIC  ANEURYSM REPAIR (AAA) WITH 28 MM HEMASHIELD PLATINUM WOVEN DOUBLE VELOUR VASCULAR GRAFT -CV- no longer in atrial fib, in junctional rhythm under DDD pacing  Index better this AM, filling pressures stable   -RESP- VDRF, no attempt to wean until chest closed  RENAL- creatinine is normal. Diuresis slowed over night  Will increase lasix drip to 8 mg/hr   ENDO- CBG reasonably well controlled  Coagulopathy- PT= 24. Will give 2 U FFP this AM in anticipation of surgery tomorrow  Will also help expand intravascular volume  GI- ALT and AST down significantly  ID- fevers persist but not as high as yesterday. WBC normal. No indication of infection currently   LOS: 5 days    Scott [name]  10/25/2016  

## 2016-10-25 NOTE — Progress Notes (Signed)
PULMONARY / CRITICAL CARE MEDICINE   Name: Scott [name] MRN: [id number] DOB: 1945/12/20    ADMISSION DATE:  10/20/2016 CONSULTATION DATE: 12/1  REFERRING MD: Ricard Dillon  CHIEF COMPLAINT:  CAD  HISTORY OF PRESENT ILLNESS:   70 yo former smoker with known CAD and ascending aorta aneurysm, who was taken to OR 11/30 for cabg x 3, aortic root replacement, MVR replacement, thoracic cavity left open and had extended pump time in OR. On APRV ventilation post op, CI 1.6 and multiple pressors. PCCM asked to manage vent.  SUBJECTIVE:  Sedated on vent  VITAL SIGNS: BP 140/70   Pulse 89   Temp (!) 100.6 F (38.1 C)   Resp 15   Ht 5\' 9"  (1.753 m)   Wt 238 lb 8.6 oz (108.2 kg)   SpO2 91%   BMI 35.23 kg/m   HEMODYNAMICS: PAP: (25-49)/(17-26) 45/22 CO:  [3.4 L/min-4.7 L/min] 4.7 L/min CI:  [1.6 L/min/m2-2.2 L/min/m2] 2.2 L/min/m2  VENTILATOR SETTINGS: Vent Mode: PRVC FiO2 (%):  [40 %-50 %] 40 % Set Rate:  [16 bmp] 16 bmp Vt Set:  [570 mL] 570 mL PEEP:  [5 cmH20-8 cmH20] 5 cmH20 Plateau Pressure:  [17 cmH20-21 cmH20] 20 cmH20  INTAKE / OUTPUT: I/O last 3 completed shifts: In: 6966.2 [I.V.:4901.2; NG/GT:1495; IV Piggyback:570] Out: [id number] [Urine:6810; Emesis/NG output:800; Drains:125; Chest Tube:940]  PHYSICAL EXAMINATION: General:  WNWDWM, sedated on vent Neuro: Arouses and follows commands at times HEENT: Scleral edema , PERL 3 mm Cardiovascular:  HSR M, open chest wound with wound vac Lungs:  Decreased bs thru out Abdomen:  Soft  Musculoskeletal:  intact Skin: cool dry  LABS:  BMET  Recent Labs Lab 10/23/16 0400 10/23/16 1558 10/23/16 1615 10/24/16 0510 10/25/16 0500  NA 141 144  --  141 141  K 4.2 4.2  --  3.7 3.2*  CL 114* 110  --  110 107  CO2 18*  --   --  23 27  BUN 12 14  --  16 20  CREATININE 1.01 0.90 1.02 0.97 0.80  GLUCOSE 142* 97  --  105* 137*    Electrolytes  Recent Labs Lab 10/23/16 0400 10/23/16 1615 10/24/16 0510 10/25/16 0500   CALCIUM 8.5*  --  8.1* 7.8*  MG 2.5* 2.0  --  1.7  PHOS  --   --   --  1.9*    CBC  Recent Labs Lab 10/23/16 1615 10/24/16 0510 10/25/16 0500  WBC 8.1 10.3 5.3  HGB 10.3* 10.5* 9.5*  HCT 29.6* 30.2* 27.5*  PLT 87* 89* 50*    Coag's  Recent Labs Lab 10/22/16 2104 10/22/16 2155 10/25/16 0500  APTT 56* 47* 43*  INR 1.85 1.74 2.15    Sepsis Markers No results for input(s): LATICACIDVEN, PROCALCITON, O2SATVEN in the last 168 hours.  ABG  Recent Labs Lab 10/23/16 1001 10/24/16 0514 10/25/16 0508  PHART 7.328* 7.401 7.412  PCO2ART 36.3 39.9 44.5  PO2ART 124.0* 155.0* 112.0*    Liver Enzymes  Recent Labs Lab 10/21/16 0217 10/24/16 0510 10/25/16 0500  AST 28 1,215* 643*  ALT 22 1,072* 913*  ALKPHOS 62 39 49  BILITOT 0.6 0.9 0.9  ALBUMIN 3.6 2.8* 2.4*    Cardiac Enzymes No results for input(s): TROPONINI, PROBNP in the last 168 hours.  Glucose  Recent Labs Lab 10/25/16 0107 10/25/16 0159 10/25/16 0259 10/25/16 0408 10/25/16 0507 10/25/16 0607  GLUCAP 184* 142* 98 145* 130* 116*    Imaging No results found.   STUDIES:  CULTURES: 12-1 bc >>  ANTIBIOTICS: 11/30 levaquin>>  SIGNIFICANT EVENTS: 11/30 OHS>>  LINES/TUBES: 11.30 OTT>> 11/30 rt I j PA cath>> 11/30 l rad aline>> 11/30 CT x 4>> 11/30 wound vac>>  DISCUSSION: 70 yo former smoker with known CAD and ascending aorta aneurysm, who was taken to OR 11/30 for cabg x 3, aortic root replacement, MVR replacement, thoracic cavity left open and had extended pump time in OR. On APRV ventilation post op, CI 1.6 and multiple pressors. PCCM asked to manage vent.  ASSESSMENT / PLAN:  PULMONARY A: VDRF secondary to extensive OHS 11/30,  prolonged pump time Former smoker P:   Transition from APRV ventilation to [facility] and tolerating well Vent bundle Follow c x r Keep sedated till sternum closed  CARDIOVASCULAR A:  Post cabg x 3, MVR, ascending aortic root repalcement Open  chest wound Hypotension CAD CI 1.6->2.2 P:  Wean pressor as possible   RENAL Lab Results  Component Value Date   CREATININE 0.80 10/25/2016   CREATININE 0.97 10/24/2016   CREATININE 1.02 10/23/2016   CREATININE 0.87 10/19/2016    A:   NO acute issue P:   Track renal function and lytes  GASTROINTESTINAL A:   Hx of Gerd  P:   PPI  HEMATOLOGIC  Recent Labs  10/24/16 0510 10/25/16 0500  HGB 10.5* 9.5*    A:   No acute issues Mild thrombocytopenia 89->50 P:  Trend  hematology labs   INFECTIOUS A:   Open chest cavity  P:   Levaquin per cvts Day #3/x  ENDOCRINE CBG (last 3)   Recent Labs  10/25/16 0408 10/25/16 0507 10/25/16 0607  GLUCAP 145* 130* 116*     A:   DM P:   SSI Insulin drip  NEUROLOGIC A:   Post OHS with prolonged pump time 11/30 Awake and follows commands 12/1 P:   RASS goal: -1 Sedate for tube tolerance and with open chest cavitary.   FAMILY  - Updates: None at bedside  - Inter-disciplinary family meet or Palliative Care meeting due by: 12/8   CCT=45 min   [name] ACNP Maryanna Shape PCCM Pager [phone] till 3 pm If no answer page [phone] 10/25/2016, 8:02 AM  Attending Note:  70 year old male, s/p MVR, CABG, aortic root replacement and now with an open left cavity and long pump time. The patient is currently on PRVC and nitric with an improving PaO2, decreased PEEP to 5 and FiO2 of 40%. On exam, coarse BS diffusely and patient is arousable. I reviewed CXR myself, ETT in good position, right sided chest tube and swan in place. Continue nitric but decrease today. Continue precedex for sedation. Titrate milrinone, norepi, vaso and neo for hemodynamic support. Hold off any weaning until chest is closed for now, plan on an OR visit in AM. TF as per nutrition.  Lasix drip at 8 mg/hr.  AM labs ordered.  Replace K.  PCCM will f/u.  The patient is critically ill with multiple organ systems failure and requires high  complexity decision making for assessment and support, frequent evaluation and titration of therapies, application of advanced monitoring technologies and extensive interpretation of multiple databases.   Critical Care Time devoted to patient care services described in this note is 35 Minutes. This time reflects time of care of this signee Dr Jennet Maduro. This critical care time does not reflect procedure time, or teaching time or supervisory time of PA/NP/Med student/Med Resident etc but could involve care discussion time.  Rush Farmer, M.D. [facility] Pulmonary/Critical Care Medicine. Pager: [phone]. After hours pager: [phone].

## 2016-10-25 NOTE — Anesthesia Preprocedure Evaluation (Addendum)
Anesthesia Evaluation  Patient identified by MRN, date of birth, ID band Patient unresponsive    Reviewed: Allergy & Precautions, NPO status , Patient's Chart, lab work & pertinent test results, reviewed documented beta blocker date and time   Airway Mallampati: Intubated  TM Distance: >3 FB Neck ROM: Full    Dental  (+) Dental Advisory Given   Pulmonary neg shortness of breath, neg sleep apnea, neg COPD, neg recent URI, former smoker,    breath sounds clear to auscultation       Cardiovascular hypertension, Pt. on medications and Pt. on home beta blockers + angina + CAD and + Peripheral Vascular Disease  + Valvular Problems/Murmurs AS, AI and MR  Rhythm:Regular     Neuro/Psych  Neuromuscular disease negative psych ROS   GI/Hepatic Neg liver ROS, GERD  Medicated,  Endo/Other  diabetes, Type 2, Insulin Dependent  Renal/GU negative Renal ROS     Musculoskeletal  (+) Arthritis ,   Abdominal   Peds  Hematology negative hematology ROS (+)   Anesthesia Other Findings   Reproductive/Obstetrics                            Anesthesia Physical  Anesthesia Plan  ASA: IV  Anesthesia Plan: General   Post-op Pain Management:    Induction: Intravenous  Airway Management Planned:   Additional Equipment: TEE, Arterial line, CVP and PA Cath  Intra-op Plan:   Post-operative Plan: Post-operative intubation/ventilation  Informed Consent: I have reviewed the patients History and Physical, chart, labs and discussed the procedure including the risks, benefits and alternatives for the proposed anesthesia with the patient or authorized representative who has indicated his/her understanding and acceptance.   Dental advisory given  Plan Discussed with: CRNA, Surgeon and Anesthesiologist  Anesthesia Plan Comments:        Anesthesia Quick Evaluation

## 2016-10-26 ENCOUNTER — Inpatient Hospital Stay (HOSPITAL_COMMUNITY): Payer: Medicare Other

## 2016-10-26 ENCOUNTER — Encounter (HOSPITAL_COMMUNITY): Payer: Self-pay | Admitting: Certified Registered"

## 2016-10-26 ENCOUNTER — Encounter (HOSPITAL_COMMUNITY)
Admission: RE | Disposition: A | Discharge status: Nursing Home (Includes ICF) | Payer: Self-pay | Source: Ambulatory Visit | Attending: Thoracic Surgery (Cardiothoracic Vascular Surgery)

## 2016-10-26 ENCOUNTER — Inpatient Hospital Stay (HOSPITAL_COMMUNITY): Payer: Medicare Other | Admitting: Certified Registered"

## 2016-10-26 DIAGNOSIS — Q231 Congenital insufficiency of aortic valve: Secondary | ICD-10-CM

## 2016-10-26 DIAGNOSIS — I5041 Acute combined systolic (congestive) and diastolic (congestive) heart failure: Secondary | ICD-10-CM

## 2016-10-26 DIAGNOSIS — I350 Nonrheumatic aortic (valve) stenosis: Secondary | ICD-10-CM

## 2016-10-26 HISTORY — PX: TEE WITHOUT CARDIOVERSION: SHX5443

## 2016-10-26 HISTORY — PX: STERNAL CLOSURE: SHX6203

## 2016-10-26 LAB — BLOOD GAS, ARTERIAL
Acid-Base Excess: 5.7 mmol/L — ABNORMAL HIGH (ref 0.0–2.0)
Bicarbonate: 29.5 mmol/L — ABNORMAL HIGH (ref 20.0–28.0)
FIO2: 40
MECHVT: 570 mL
NITRIC OXIDE: 20
O2 SAT: 98.3 %
PATIENT TEMPERATURE: 98.6
PCO2 ART: 41.9 mmHg (ref 32.0–48.0)
PEEP/CPAP: 5 cmH2O
PH ART: 7.462 — AB (ref 7.350–7.450)
RATE: 16 resp/min
pO2, Arterial: 103 mmHg (ref 83.0–108.0)

## 2016-10-26 LAB — POCT I-STAT 3, ART BLOOD GAS (G3+)
ACID-BASE EXCESS: 2 mmol/L (ref 0.0–2.0)
BICARBONATE: 26.5 mmol/L (ref 20.0–28.0)
O2 Saturation: 95 %
PO2 ART: 80 mmHg — AB (ref 83.0–108.0)
Patient temperature: 38.3
TCO2: 28 mmol/L (ref 0–100)
pCO2 arterial: 44.7 mmHg (ref 32.0–48.0)
pH, Arterial: 7.387 (ref 7.350–7.450)

## 2016-10-26 LAB — COOXEMETRY PANEL
CARBOXYHEMOGLOBIN: 1.5 % (ref 0.5–1.5)
CARBOXYHEMOGLOBIN: 1.1 % (ref 0.5–1.5)
Carboxyhemoglobin: 1 % (ref 0.5–1.5)
Carboxyhemoglobin: 0.8 % (ref 0.5–1.5)
Carboxyhemoglobin: 1.3 % (ref 0.5–1.5)
METHEMOGLOBIN: 1 % (ref 0.0–1.5)
METHEMOGLOBIN: 1 % (ref 0.0–1.5)
METHEMOGLOBIN: 0.9 % (ref 0.0–1.5)
METHEMOGLOBIN: 1 % (ref 0.0–1.5)
METHEMOGLOBIN: 1.2 % (ref 0.0–1.5)
O2 SAT: 62 %
O2 SAT: 36.5 %
O2 Saturation: 36.5 %
O2 Saturation: 40.7 %
O2 Saturation: 56.3 %
TOTAL HEMOGLOBIN: 9.2 g/dL — AB (ref 12.0–16.0)
TOTAL HEMOGLOBIN: 10.7 g/dL — AB (ref 12.0–16.0)
Total hemoglobin: 9.4 g/dL — ABNORMAL LOW (ref 12.0–16.0)
Total hemoglobin: 13.7 g/dL (ref 12.0–16.0)
Total hemoglobin: 11 g/dL — ABNORMAL LOW (ref 12.0–16.0)

## 2016-10-26 LAB — CBC
HCT: 27 % — ABNORMAL LOW (ref 39.0–52.0)
HEMATOCRIT: 27.4 % — AB (ref 39.0–52.0)
HEMATOCRIT: 26.2 % — AB (ref 39.0–52.0)
HEMOGLOBIN: 9.1 g/dL — AB (ref 13.0–17.0)
HEMOGLOBIN: 9.1 g/dL — AB (ref 13.0–17.0)
Hemoglobin: 8.7 g/dL — ABNORMAL LOW (ref 13.0–17.0)
MCH: 29.6 pg (ref 26.0–34.0)
MCH: 29.4 pg (ref 26.0–34.0)
MCH: 29.5 pg (ref 26.0–34.0)
MCHC: 33.7 g/dL (ref 30.0–36.0)
MCHC: 33.2 g/dL (ref 30.0–36.0)
MCHC: 33.2 g/dL (ref 30.0–36.0)
MCV: 87.9 fL (ref 78.0–100.0)
MCV: 88.4 fL (ref 78.0–100.0)
MCV: 88.8 fL (ref 78.0–100.0)
Platelets: 84 10*3/uL — ABNORMAL LOW (ref 150–400)
Platelets: 98 10*3/uL — ABNORMAL LOW (ref 150–400)
Platelets: 118 10*3/uL — ABNORMAL LOW (ref 150–400)
RBC: 3.07 MIL/uL — AB (ref 4.22–5.81)
RBC: 3.1 MIL/uL — ABNORMAL LOW (ref 4.22–5.81)
RBC: 2.95 MIL/uL — AB (ref 4.22–5.81)
RDW: 15.1 % (ref 11.5–15.5)
RDW: 15.4 % (ref 11.5–15.5)
RDW: 15.2 % (ref 11.5–15.5)
WBC: 6.5 10*3/uL (ref 4.0–10.5)
WBC: 7.7 10*3/uL (ref 4.0–10.5)
WBC: 8.5 10*3/uL (ref 4.0–10.5)

## 2016-10-26 LAB — MAGNESIUM
MAGNESIUM: 1.8 mg/dL (ref 1.7–2.4)
Magnesium: 2.6 mg/dL — ABNORMAL HIGH (ref 1.7–2.4)

## 2016-10-26 LAB — GLUCOSE, CAPILLARY
GLUCOSE-CAPILLARY: 100 mg/dL — AB (ref 65–99)
GLUCOSE-CAPILLARY: 102 mg/dL — AB (ref 65–99)
GLUCOSE-CAPILLARY: 104 mg/dL — AB (ref 65–99)
GLUCOSE-CAPILLARY: 108 mg/dL — AB (ref 65–99)
GLUCOSE-CAPILLARY: 116 mg/dL — AB (ref 65–99)
GLUCOSE-CAPILLARY: 116 mg/dL — AB (ref 65–99)
GLUCOSE-CAPILLARY: 89 mg/dL (ref 65–99)
GLUCOSE-CAPILLARY: 95 mg/dL (ref 65–99)
GLUCOSE-CAPILLARY: 99 mg/dL (ref 65–99)
Glucose-Capillary: 92 mg/dL (ref 65–99)
Glucose-Capillary: 101 mg/dL — ABNORMAL HIGH (ref 65–99)
Glucose-Capillary: 80 mg/dL (ref 65–99)
Glucose-Capillary: 89 mg/dL (ref 65–99)
Glucose-Capillary: 90 mg/dL (ref 65–99)
Glucose-Capillary: 92 mg/dL (ref 65–99)
Glucose-Capillary: 106 mg/dL — ABNORMAL HIGH (ref 65–99)
Glucose-Capillary: 110 mg/dL — ABNORMAL HIGH (ref 65–99)
Glucose-Capillary: 144 mg/dL — ABNORMAL HIGH (ref 65–99)
Glucose-Capillary: 125 mg/dL — ABNORMAL HIGH (ref 65–99)
Glucose-Capillary: 99 mg/dL (ref 65–99)

## 2016-10-26 LAB — PROTIME-INR
INR: 1.96
INR: 1.91
Prothrombin Time: 22.6 seconds — ABNORMAL HIGH (ref 11.4–15.2)
Prothrombin Time: 22.2 seconds — ABNORMAL HIGH (ref 11.4–15.2)

## 2016-10-26 LAB — BASIC METABOLIC PANEL
ANION GAP: 10 (ref 5–15)
Anion gap: 9 (ref 5–15)
BUN: 27 mg/dL — ABNORMAL HIGH (ref 6–20)
BUN: 30 mg/dL — ABNORMAL HIGH (ref 6–20)
CALCIUM: 7.8 mg/dL — AB (ref 8.9–10.3)
CALCIUM: 7.6 mg/dL — AB (ref 8.9–10.3)
CO2: 28 mmol/L (ref 22–32)
CO2: 25 mmol/L (ref 22–32)
Chloride: 107 mmol/L (ref 101–111)
Chloride: 109 mmol/L (ref 101–111)
Creatinine, Ser: 0.86 mg/dL (ref 0.61–1.24)
Creatinine, Ser: 1.09 mg/dL (ref 0.61–1.24)
GFR calc Af Amer: >60 mL/min (ref >60)
GFR calc Af Amer: >60 mL/min (ref >60)
GFR calc non Af Amer: >60 mL/min (ref >60)
GLUCOSE: 109 mg/dL — AB (ref 65–99)
Glucose, Bld: 88 mg/dL (ref 65–99)
Potassium: 3.2 mmol/L — ABNORMAL LOW (ref 3.5–5.1)
Potassium: 3.9 mmol/L (ref 3.5–5.1)
SODIUM: 144 mmol/L (ref 135–145)
Sodium: 144 mmol/L (ref 135–145)

## 2016-10-26 LAB — PREPARE FRESH FROZEN PLASMA
BLOOD PRODUCT EXPIRATION DATE: 201712082359
Blood Product Expiration Date: 201712082359
ISSUE DATE / TIME: 201712031009
ISSUE DATE / TIME: 201712031009
UNIT TYPE AND RH: 5100
UNIT TYPE AND RH: 9500

## 2016-10-26 LAB — CARBOXYHEMOGLOBIN - COOX

## 2016-10-26 LAB — HEPATIC FUNCTION PANEL
ALBUMIN: 2.3 g/dL — AB (ref 3.5–5.0)
ALK PHOS: 62 U/L (ref 38–126)
ALT: 540 U/L — ABNORMAL HIGH (ref 17–63)
AST: 229 U/L — ABNORMAL HIGH (ref 15–41)
Bilirubin, Direct: 0.3 mg/dL (ref 0.1–0.5)
Indirect Bilirubin: 0.4 mg/dL (ref 0.3–0.9)
TOTAL PROTEIN: 4.6 g/dL — AB (ref 6.5–8.1)
Total Bilirubin: 0.7 mg/dL (ref 0.3–1.2)

## 2016-10-26 LAB — POCT I-STAT, CHEM 8
BUN: 32 mg/dL — AB (ref 6–20)
CALCIUM ION: 1.03 mmol/L — AB (ref 1.15–1.40)
CREATININE: 1 mg/dL (ref 0.61–1.24)
Chloride: 105 mmol/L (ref 101–111)
Glucose, Bld: 105 mg/dL — ABNORMAL HIGH (ref 65–99)
HCT: 24 % — ABNORMAL LOW (ref 39.0–52.0)
Hemoglobin: 8.2 g/dL — ABNORMAL LOW (ref 13.0–17.0)
Potassium: 3.8 mmol/L (ref 3.5–5.1)
Sodium: 144 mmol/L (ref 135–145)
TCO2: 25 mmol/L (ref 0–100)

## 2016-10-26 LAB — PHOSPHORUS: PHOSPHORUS: 1.7 mg/dL — AB (ref 2.5–4.6)

## 2016-10-26 LAB — APTT
APTT: 41 s — AB (ref 24–36)
APTT: 42 s — AB (ref 24–36)
aPTT: 77 seconds — ABNORMAL HIGH (ref 24–36)
aPTT: 82 seconds — ABNORMAL HIGH (ref 24–36)

## 2016-10-26 LAB — POCT I-STAT 4, (NA,K, GLUC, HGB,HCT)
GLUCOSE: 109 mg/dL — AB (ref 65–99)
HCT: 26 % — ABNORMAL LOW (ref 39.0–52.0)
Hemoglobin: 8.8 g/dL — ABNORMAL LOW (ref 13.0–17.0)
POTASSIUM: 3.7 mmol/L (ref 3.5–5.1)
Sodium: 145 mmol/L (ref 135–145)

## 2016-10-26 LAB — ECHO INTRAOPERATIVE TEE
Height: 69 in
Weight: 3943.59 oz

## 2016-10-26 SURGERY — CLOSURE, STERNUM
Anesthesia: General

## 2016-10-26 MED ORDER — PHENYLEPHRINE HCL 10 MG/ML IJ SOLN
0.0000 ug/min | INTRAVENOUS | Status: DC
  Filled 2016-10-26: qty 2

## 2016-10-26 MED ORDER — SUFENTANIL CITRATE 50 MCG/ML IV SOLN
INTRAVENOUS | Status: DC | PRN
  Administered 2016-10-26: 20 ug via INTRAVENOUS

## 2016-10-26 MED ORDER — SODIUM CHLORIDE 0.9% FLUSH
10.0000 mL | Freq: Two times a day (BID) | INTRAVENOUS | Status: DC
  Administered 2016-10-26 (×2): 10 mL

## 2016-10-26 MED ORDER — MIDAZOLAM HCL 2 MG/2ML IJ SOLN
INTRAMUSCULAR | Status: AC
  Filled 2016-10-26: qty 4

## 2016-10-26 MED ORDER — SODIUM CHLORIDE 0.9 % IV SOLN: 250.0000 mL | INTRAVENOUS | Status: DC

## 2016-10-26 MED ORDER — SODIUM CHLORIDE 0.9 % IJ SOLN
INTRAMUSCULAR | Status: DC | PRN
  Administered 2016-10-26: 4 mL via TOPICAL

## 2016-10-26 MED ORDER — CHLORHEXIDINE GLUCONATE 0.12 % MT SOLN
15.0000 mL | OROMUCOSAL | Status: AC
  Administered 2016-10-26: 15 mL via OROMUCOSAL

## 2016-10-26 MED ORDER — SODIUM CHLORIDE 0.9% FLUSH
3.0000 mL | Freq: Two times a day (BID) | INTRAVENOUS | Status: DC
  Administered 2016-10-27 – 2016-11-16 (×26): 3 mL via INTRAVENOUS
  Administered 2016-11-17: 10 mL via INTRAVENOUS

## 2016-10-26 MED ORDER — OXYCODONE HCL 5 MG PO TABS: 5.0000 mg | ORAL_TABLET | ORAL | Status: DC | PRN

## 2016-10-26 MED ORDER — MIDAZOLAM HCL 5 MG/5ML IJ SOLN
INTRAMUSCULAR | Status: DC | PRN
  Administered 2016-10-26: 2 mg via INTRAVENOUS

## 2016-10-26 MED ORDER — SODIUM CHLORIDE 0.9 % IV SOLN
30.0000 meq | Freq: Once | INTRAVENOUS | Status: AC
  Administered 2016-10-26: 30 meq via INTRAVENOUS
  Filled 2016-10-26: qty 15

## 2016-10-26 MED ORDER — VECURONIUM BROMIDE 10 MG IV SOLR
INTRAVENOUS | Status: AC
  Filled 2016-10-26: qty 10

## 2016-10-26 MED ORDER — FUROSEMIDE 10 MG/ML IJ SOLN
15.0000 mg/h | INTRAVENOUS | Status: DC
  Administered 2016-10-26: 8 mg/h via INTRAVENOUS
  Administered 2016-10-27: 12 mg/h via INTRAVENOUS
  Administered 2016-10-28 – 2016-10-30 (×2): 15 mg/h via INTRAVENOUS
  Filled 2016-10-26 (×10): qty 25

## 2016-10-26 MED ORDER — PROPOFOL 10 MG/ML IV BOLUS
INTRAVENOUS | Status: AC
  Filled 2016-10-26: qty 20

## 2016-10-26 MED ORDER — BISACODYL 5 MG PO TBEC: 10.0000 mg | DELAYED_RELEASE_TABLET | Freq: Every day | ORAL | Status: DC

## 2016-10-26 MED ORDER — HEPARIN SODIUM (PORCINE) 1000 UNIT/ML IJ SOLN
INTRAMUSCULAR | Status: AC
  Filled 2016-10-26: qty 1

## 2016-10-26 MED ORDER — SUFENTANIL CITRATE 50 MCG/ML IV SOLN
INTRAVENOUS | Status: AC
  Filled 2016-10-26: qty 1

## 2016-10-26 MED ORDER — TRAMADOL HCL 50 MG PO TABS: 50.0000 mg | ORAL_TABLET | ORAL | Status: DC | PRN

## 2016-10-26 MED ORDER — PANTOPRAZOLE SODIUM 40 MG PO TBEC: 40.0000 mg | DELAYED_RELEASE_TABLET | Freq: Every day | ORAL | Status: DC

## 2016-10-26 MED ORDER — ACETAMINOPHEN 160 MG/5ML PO SOLN
650.0000 mg | Freq: Once | ORAL | Status: AC
  Administered 2016-10-26: 650 mg
  Filled 2016-10-26: qty 20.3

## 2016-10-26 MED ORDER — DOCUSATE SODIUM 100 MG PO CAPS: 200.0000 mg | ORAL_CAPSULE | Freq: Every day | ORAL | Status: DC

## 2016-10-26 MED ORDER — LEVOFLOXACIN IN D5W 750 MG/150ML IV SOLN
750.0000 mg | INTRAVENOUS | Status: DC
  Filled 2016-10-26 (×2): qty 150

## 2016-10-26 MED ORDER — AMIODARONE HCL IN DEXTROSE 360-4.14 MG/200ML-% IV SOLN
30.0000 mg/h | INTRAVENOUS | Status: DC
  Administered 2016-10-26 – 2016-10-29 (×5): 30 mg/h via INTRAVENOUS
  Filled 2016-10-26 (×7): qty 200

## 2016-10-26 MED ORDER — CHLORHEXIDINE GLUCONATE 0.12% ORAL RINSE (MEDLINE KIT)
15.0000 mL | Freq: Two times a day (BID) | OROMUCOSAL | Status: DC
  Administered 2016-10-26 – 2016-11-18 (×44): 15 mL via OROMUCOSAL

## 2016-10-26 MED ORDER — SODIUM CHLORIDE 0.9 % IV SOLN: Freq: Once | INTRAVENOUS | Status: DC

## 2016-10-26 MED ORDER — METOPROLOL TARTRATE 5 MG/5ML IV SOLN
2.5000 mg | INTRAVENOUS | Status: DC | PRN
  Administered 2016-10-27 – 2016-11-14 (×6): 5 mg via INTRAVENOUS
  Filled 2016-10-26 (×5): qty 5

## 2016-10-26 MED ORDER — NITROGLYCERIN IN D5W 200-5 MCG/ML-% IV SOLN: 0.0000 ug/min | INTRAVENOUS | Status: DC

## 2016-10-26 MED ORDER — FENTANYL 2500MCG IN NS 250ML (10MCG/ML) PREMIX INFUSION
25.0000 ug/h | INTRAVENOUS | Status: DC
  Administered 2016-10-26: 150 ug/h via INTRAVENOUS
  Administered 2016-10-26: 200 ug/h via INTRAVENOUS
  Administered 2016-10-27 (×2): 100 ug/h via INTRAVENOUS
  Administered 2016-10-28: 25 ug/h via INTRAVENOUS
  Administered 2016-10-29: 100 ug/h via INTRAVENOUS
  Administered 2016-10-29: 200 ug/h via INTRAVENOUS
  Administered 2016-10-30: 300 ug/h via INTRAVENOUS
  Administered 2016-10-30 (×2): 200 ug/h via INTRAVENOUS
  Administered 2016-10-31: 400 ug/h via INTRAVENOUS
  Administered 2016-10-31: 300 ug/h via INTRAVENOUS
  Administered 2016-11-01 (×2): 200 ug/h via INTRAVENOUS
  Filled 2016-10-26 (×15): qty 250

## 2016-10-26 MED ORDER — LACTATED RINGERS IV SOLN: INTRAVENOUS | Status: DC

## 2016-10-26 MED ORDER — ACETAMINOPHEN 160 MG/5ML PO SOLN
1000.0000 mg | Freq: Four times a day (QID) | ORAL | Status: DC
  Administered 2016-10-27 (×2): 1000 mg
  Filled 2016-10-26 (×2): qty 40.6

## 2016-10-26 MED ORDER — 0.9 % SODIUM CHLORIDE (POUR BTL) OPTIME
TOPICAL | Status: DC | PRN
  Administered 2016-10-26: 4000 mL
  Administered 2016-10-26: 2000 mL

## 2016-10-26 MED ORDER — PROTAMINE SULFATE 10 MG/ML IV SOLN
INTRAVENOUS | Status: AC
  Filled 2016-10-26: qty 5

## 2016-10-26 MED ORDER — ACETAMINOPHEN 650 MG RE SUPP: 650.0000 mg | Freq: Once | RECTAL | Status: AC

## 2016-10-26 MED ORDER — MIDAZOLAM HCL 2 MG/2ML IJ SOLN
2.0000 mg | INTRAMUSCULAR | Status: DC | PRN
  Administered 2016-10-26 – 2016-11-14 (×39): 2 mg via INTRAVENOUS
  Filled 2016-10-26 (×40): qty 2

## 2016-10-26 MED ORDER — ASPIRIN 81 MG PO CHEW
324.0000 mg | CHEWABLE_TABLET | Freq: Every day | ORAL | Status: DC
  Administered 2016-10-26 – 2016-11-15 (×18): 324 mg
  Filled 2016-10-26 (×19): qty 4

## 2016-10-26 MED ORDER — ASPIRIN EC 325 MG PO TBEC
325.0000 mg | DELAYED_RELEASE_TABLET | Freq: Every day | ORAL | Status: DC
  Administered 2016-10-31 – 2016-11-13 (×3): 325 mg via ORAL
  Filled 2016-10-26 (×2): qty 1

## 2016-10-26 MED ORDER — ONDANSETRON HCL 4 MG/2ML IJ SOLN: 4.0000 mg | Freq: Four times a day (QID) | INTRAMUSCULAR | Status: DC | PRN

## 2016-10-26 MED ORDER — BISACODYL 10 MG RE SUPP
10.0000 mg | Freq: Every day | RECTAL | Status: DC
  Administered 2016-10-26 – 2016-10-27 (×2): 10 mg via RECTAL
  Filled 2016-10-26 (×3): qty 1

## 2016-10-26 MED ORDER — FUROSEMIDE 10 MG/ML IJ SOLN
12.0000 mg/h | INTRAVENOUS | Status: DC
  Filled 2016-10-26: qty 25

## 2016-10-26 MED ORDER — SODIUM CHLORIDE 0.9 % IV SOLN
INTRAVENOUS | Status: DC
  Administered 2016-10-26: 17:00:00 via INTRAVENOUS

## 2016-10-26 MED ORDER — MORPHINE SULFATE (PF) 2 MG/ML IV SOLN: 1.0000 mg | INTRAVENOUS | Status: AC | PRN

## 2016-10-26 MED ORDER — SODIUM CHLORIDE 0.45 % IV SOLN: INTRAVENOUS | Status: DC | PRN

## 2016-10-26 MED ORDER — SODIUM CHLORIDE 0.9% FLUSH: 10.0000 mL | INTRAVENOUS | Status: DC | PRN

## 2016-10-26 MED ORDER — ALBUMIN HUMAN 5 % IV SOLN: 250.0000 mL | INTRAVENOUS | Status: AC | PRN

## 2016-10-26 MED ORDER — SODIUM CHLORIDE 0.9% FLUSH
3.0000 mL | INTRAVENOUS | Status: DC | PRN
  Administered 2016-11-05: 3 mL via INTRAVENOUS
  Filled 2016-10-26: qty 3

## 2016-10-26 MED ORDER — SODIUM CHLORIDE 0.9 % IV SOLN
0.0800 mg/kg/h | INTRAVENOUS | Status: DC
  Administered 2016-10-26: 0.1 mg/kg/h via INTRAVENOUS
  Administered 2016-10-27 (×2): 0.08 mg/kg/h via INTRAVENOUS
  Administered 2016-10-28 – 2016-10-31 (×4): 0.07 mg/kg/h via INTRAVENOUS
  Administered 2016-11-01 – 2016-11-03 (×3): 0.08 mg/kg/h via INTRAVENOUS
  Filled 2016-10-26 (×10): qty 250

## 2016-10-26 MED ORDER — VECURONIUM BROMIDE 10 MG IV SOLR
INTRAVENOUS | Status: DC | PRN
  Administered 2016-10-26: 2 mg via INTRAVENOUS
  Administered 2016-10-26: 6 mg via INTRAVENOUS

## 2016-10-26 MED ORDER — NOREPINEPHRINE BITARTRATE 1 MG/ML IV SOLN
0.0000 ug/min | INTRAVENOUS | Status: DC
  Administered 2016-10-26: 4 ug/min via INTRAVENOUS
  Filled 2016-10-26: qty 16

## 2016-10-26 MED ORDER — ORAL CARE MOUTH RINSE
15.0000 mL | Freq: Four times a day (QID) | OROMUCOSAL | Status: DC
  Administered 2016-10-27 – 2016-11-18 (×89): 15 mL via OROMUCOSAL

## 2016-10-26 MED ORDER — SODIUM CHLORIDE 0.9 % IV SOLN
INTRAVENOUS | Status: DC
  Administered 2016-10-26: 1.5 [IU]/h via INTRAVENOUS
  Filled 2016-10-26 (×2): qty 2.5

## 2016-10-26 MED ORDER — DEXMEDETOMIDINE HCL IN NACL 400 MCG/100ML IV SOLN
0.4000 ug/kg/h | INTRAVENOUS | Status: DC
  Administered 2016-10-26 – 2016-10-27 (×4): 1.2 ug/kg/h via INTRAVENOUS
  Administered 2016-10-27: 0.9 ug/kg/h via INTRAVENOUS
  Administered 2016-10-27 – 2016-11-03 (×39): 1.2 ug/kg/h via INTRAVENOUS
  Administered 2016-11-03: 0.9 ug/kg/h via INTRAVENOUS
  Administered 2016-11-03 – 2016-11-04 (×4): 1.2 ug/kg/h via INTRAVENOUS
  Administered 2016-11-04: 0.9 ug/kg/h via INTRAVENOUS
  Administered 2016-11-04 – 2016-11-05 (×5): 1.2 ug/kg/h via INTRAVENOUS
  Administered 2016-11-05: 1 ug/kg/h via INTRAVENOUS
  Administered 2016-11-05 – 2016-11-07 (×14): 1.2 ug/kg/h via INTRAVENOUS
  Administered 2016-11-07: 1 ug/kg/h via INTRAVENOUS
  Administered 2016-11-08 – 2016-11-09 (×16): 1.2 ug/kg/h via INTRAVENOUS
  Administered 2016-11-10: 0.7 ug/kg/h via INTRAVENOUS
  Administered 2016-11-10 (×5): 1.2 ug/kg/h via INTRAVENOUS
  Administered 2016-11-11: 1 ug/kg/h via INTRAVENOUS
  Administered 2016-11-11: 1.2 ug/kg/h via INTRAVENOUS
  Administered 2016-11-11: 1 ug/kg/h via INTRAVENOUS
  Administered 2016-11-11: 0.5 ug/kg/h via INTRAVENOUS
  Administered 2016-11-11: 1 ug/kg/h via INTRAVENOUS
  Administered 2016-11-12: 0.5 ug/kg/h via INTRAVENOUS
  Administered 2016-11-12: 1 ug/kg/h via INTRAVENOUS
  Administered 2016-11-12: 0.6 ug/kg/h via INTRAVENOUS
  Administered 2016-11-12: 0.4 ug/kg/h via INTRAVENOUS
  Administered 2016-11-12: 1 ug/kg/h via INTRAVENOUS
  Administered 2016-11-13 – 2016-11-14 (×4): 0.5 ug/kg/h via INTRAVENOUS
  Filled 2016-10-26 (×16): qty 100
  Filled 2016-10-26: qty 200
  Filled 2016-10-26 (×4): qty 100
  Filled 2016-10-26: qty 50
  Filled 2016-10-26 (×11): qty 100
  Filled 2016-10-26: qty 200
  Filled 2016-10-26 (×30): qty 100
  Filled 2016-10-26: qty 200
  Filled 2016-10-26 (×38): qty 100
  Filled 2016-10-26: qty 200
  Filled 2016-10-26 (×10): qty 100

## 2016-10-26 MED ORDER — SODIUM CHLORIDE 0.9 % IV SOLN
INTRAVENOUS | Status: DC
  Administered 2016-10-26: 12:00:00 via INTRAVENOUS

## 2016-10-26 MED ORDER — LACTATED RINGERS IV SOLN
INTRAVENOUS | Status: DC | PRN
  Administered 2016-10-26: 09:00:00 via INTRAVENOUS

## 2016-10-26 MED ORDER — MORPHINE SULFATE (PF) 2 MG/ML IV SOLN
1.0000 mg | INTRAVENOUS | Status: DC | PRN
  Administered 2016-10-31 – 2016-11-14 (×3): 2 mg via INTRAVENOUS
  Filled 2016-10-26 (×4): qty 1

## 2016-10-26 MED ORDER — FAMOTIDINE IN NACL 20-0.9 MG/50ML-% IV SOLN
20.0000 mg | Freq: Two times a day (BID) | INTRAVENOUS | Status: AC
  Administered 2016-10-26 (×2): 20 mg via INTRAVENOUS
  Filled 2016-10-26: qty 50

## 2016-10-26 MED ORDER — PROTAMINE SULFATE 10 MG/ML IV SOLN
INTRAVENOUS | Status: AC
  Filled 2016-10-26: qty 25

## 2016-10-26 MED ORDER — MILRINONE LACTATE IN DEXTROSE 20-5 MG/100ML-% IV SOLN
0.1000 ug/kg/min | INTRAVENOUS | Status: DC
  Administered 2016-10-26 – 2016-10-29 (×7): 0.3 ug/kg/min via INTRAVENOUS
  Administered 2016-10-30 – 2016-11-01 (×4): 0.2 ug/kg/min via INTRAVENOUS
  Administered 2016-11-02 – 2016-11-03 (×2): 0.1 ug/kg/min via INTRAVENOUS
  Filled 2016-10-26 (×13): qty 100

## 2016-10-26 MED ORDER — SODIUM CHLORIDE 0.9 % IR SOLN
Status: DC | PRN
  Administered 2016-10-26: 500 mL

## 2016-10-26 MED ORDER — ACETAMINOPHEN 500 MG PO TABS: 1000.0000 mg | ORAL_TABLET | Freq: Four times a day (QID) | ORAL | Status: DC

## 2016-10-26 MED ORDER — MAGNESIUM SULFATE 4 GM/100ML IV SOLN
4.0000 g | Freq: Once | INTRAVENOUS | Status: AC
  Administered 2016-10-26: 4 g via INTRAVENOUS
  Filled 2016-10-26: qty 100

## 2016-10-26 MED ORDER — LACTATED RINGERS IV SOLN: 500.0000 mL | Freq: Once | INTRAVENOUS | Status: DC | PRN

## 2016-10-26 MED ORDER — POTASSIUM CHLORIDE 2 MEQ/ML IV SOLN
30.0000 meq | Freq: Once | INTRAVENOUS | Status: DC
  Filled 2016-10-26: qty 15

## 2016-10-26 MED ORDER — VANCOMYCIN HCL IN DEXTROSE 1-5 GM/200ML-% IV SOLN
1000.0000 mg | Freq: Once | INTRAVENOUS | Status: AC
  Administered 2016-10-26: 1000 mg via INTRAVENOUS
  Filled 2016-10-26: qty 200

## 2016-10-26 MED ORDER — INSULIN REGULAR BOLUS VIA INFUSION
0.0000 [IU] | Freq: Three times a day (TID) | INTRAVENOUS | Status: DC
  Filled 2016-10-26: qty 10

## 2016-10-26 MED ORDER — SODIUM CHLORIDE 0.9 % IV SOLN: INTRAVENOUS | Status: DC

## 2016-10-26 MED ORDER — SODIUM CHLORIDE 0.9 % IJ SOLN
INTRAMUSCULAR | Status: AC
  Filled 2016-10-26: qty 10

## 2016-10-26 MED ORDER — NOREPINEPHRINE BITARTRATE 1 MG/ML IV SOLN
0.0000 ug/min | INTRAVENOUS | Status: DC
  Filled 2016-10-26: qty 4

## 2016-10-26 MED ORDER — SODIUM PHOSPHATES 45 MMOLE/15ML IV SOLN
20.0000 mmol | Freq: Once | INTRAVENOUS | Status: AC
  Administered 2016-10-26: 20 mmol via INTRAVENOUS
  Filled 2016-10-26: qty 6.67

## 2016-10-26 MED FILL — Potassium Chloride Inj 2 mEq/ML: INTRAVENOUS | Qty: 40 | Status: AC

## 2016-10-26 MED FILL — Magnesium Sulfate Inj 50%: INTRAMUSCULAR | Qty: 10 | Status: AC

## 2016-10-26 MED FILL — Heparin Sodium (Porcine) Inj 1000 Unit/ML: INTRAMUSCULAR | Qty: 30 | Status: AC

## 2016-10-26 SURGICAL SUPPLY — 68 items
APL SKNCLS STERI-STRIP NONHPOA (GAUZE/BANDAGES/DRESSINGS)
ATTRACTOMAT 16X20 MAGNETIC DRP (DRAPES) ×2 IMPLANT
BAG DECANTER FOR FLEXI CONT (MISCELLANEOUS) ×2 IMPLANT
BENZOIN TINCTURE PRP APPL 2/3 (GAUZE/BANDAGES/DRESSINGS) IMPLANT
BLADE SURG 10 STRL SS (BLADE) ×2 IMPLANT
BLADE SURG 11 STRL SS (BLADE) ×1 IMPLANT
BNDG GAUZE ELAST 4 BULKY (GAUZE/BANDAGES/DRESSINGS) IMPLANT
CANISTER SUCTION 2500CC (MISCELLANEOUS) ×2 IMPLANT
CATH THORACIC 28FR RT ANG (CATHETERS) IMPLANT
CATH THORACIC 36FR (CATHETERS) IMPLANT
CATH THORACIC 36FR RT ANG (CATHETERS) IMPLANT
CLIP TI WIDE RED SMALL 24 (CLIP) IMPLANT
CONN Y 3/8X3/8X3/8  BEN (MISCELLANEOUS)
CONN Y 3/8X3/8X3/8 BEN (MISCELLANEOUS) ×1 IMPLANT
CONT SPEC 4OZ CLIKSEAL STRL BL (MISCELLANEOUS) IMPLANT
DRAPE CARDIOVASCULAR INCISE (DRAPES) ×2
DRAPE LAPAROSCOPIC ABDOMINAL (DRAPES) ×2 IMPLANT
DRAPE SRG 135X102X78XABS (DRAPES) IMPLANT
DRSG AQUACEL AG ADV 3.5X14 (GAUZE/BANDAGES/DRESSINGS) ×1 IMPLANT
DRSG PAD ABDOMINAL 8X10 ST (GAUZE/BANDAGES/DRESSINGS) ×3 IMPLANT
ELECT REM PT RETURN 9FT ADLT (ELECTROSURGICAL) ×2
ELECTRODE REM PT RTRN 9FT ADLT (ELECTROSURGICAL) ×1 IMPLANT
GAUZE SPONGE 4X4 12PLY STRL (GAUZE/BANDAGES/DRESSINGS) ×2 IMPLANT
GAUZE XEROFORM 5X9 LF (GAUZE/BANDAGES/DRESSINGS) IMPLANT
GLOVE BIO SURGEON STRL SZ 6.5 (GLOVE) ×1 IMPLANT
GLOVE BIOGEL PI IND STRL 6 (GLOVE) IMPLANT
GLOVE BIOGEL PI INDICATOR 6 (GLOVE) ×2
GLOVE ORTHO TXT STRL SZ7.5 (GLOVE) ×1 IMPLANT
GOWN STRL REUS W/ TWL LRG LVL3 (GOWN DISPOSABLE) ×4 IMPLANT
GOWN STRL REUS W/TWL LRG LVL3 (GOWN DISPOSABLE) ×8
HANDPIECE INTERPULSE COAX TIP (DISPOSABLE) ×2
HEMOSTAT POWDER SURGIFOAM 1G (HEMOSTASIS) IMPLANT
HEMOSTAT SURGICEL 2X14 (HEMOSTASIS) ×1 IMPLANT
KIT BASIN OR (CUSTOM PROCEDURE TRAY) ×2 IMPLANT
KIT ROOM TURNOVER OR (KITS) ×2 IMPLANT
KIT SUCTION CATH 14FR (SUCTIONS) ×1 IMPLANT
MARKER GRAFT CORONARY BYPASS (MISCELLANEOUS) ×1 IMPLANT
NS IRRIG 1000ML POUR BTL (IV SOLUTION) ×5 IMPLANT
PACK CHEST (CUSTOM PROCEDURE TRAY) ×2 IMPLANT
PAD ARMBOARD 7.5X6 YLW CONV (MISCELLANEOUS) ×4 IMPLANT
PIN SAFETY STERILE (MISCELLANEOUS) IMPLANT
RUBBERBAND STERILE (MISCELLANEOUS) IMPLANT
SET HNDPC FAN SPRY TIP SCT (DISPOSABLE) IMPLANT
SOLUTION BETADINE 4OZ (MISCELLANEOUS) IMPLANT
SPONGE GAUZE 4X4 12PLY STER LF (GAUZE/BANDAGES/DRESSINGS) ×1 IMPLANT
SPONGE LAP 18X18 X RAY DECT (DISPOSABLE) ×1 IMPLANT
STAPLER VISISTAT 35W (STAPLE) IMPLANT
STRAP MONTGOMERY 1.25X11-1/8 (MISCELLANEOUS) IMPLANT
SUT ETHIBOND X763 2 0 SH 1 (SUTURE) ×1 IMPLANT
SUT ETHILON 3 0 FSL (SUTURE) IMPLANT
SUT MNCRL AB 3-0 PS2 18 (SUTURE) ×2 IMPLANT
SUT MNCRL AB 4-0 PS2 18 (SUTURE) IMPLANT
SUT PDS AB 1 CTX 36 (SUTURE) ×4 IMPLANT
SUT SILK 3 0 SH CR/8 (SUTURE) ×1 IMPLANT
SUT STEEL 6MS V (SUTURE) IMPLANT
SUT STEEL STERNAL CCS#1 18IN (SUTURE) ×1 IMPLANT
SUT STEEL SZ 6 DBL 3X14 BALL (SUTURE) ×2 IMPLANT
SUT VIC AB 1 CTX 36 (SUTURE)
SUT VIC AB 1 CTX36XBRD ANBCTR (SUTURE) IMPLANT
SUT VIC AB 2-0 CTX 27 (SUTURE) ×2 IMPLANT
SUT VIC AB 3-0 X1 27 (SUTURE) IMPLANT
SWAB COLLECTION DEVICE MRSA (MISCELLANEOUS) IMPLANT
SYSTEM SAHARA CHEST DRAIN ATS (WOUND CARE) ×2 IMPLANT
TAPE CLOTH SURG 4X10 WHT LF (GAUZE/BANDAGES/DRESSINGS) ×1 IMPLANT
TOWEL OR 17X24 6PK STRL BLUE (TOWEL DISPOSABLE) ×2 IMPLANT
TOWEL OR 17X26 10 PK STRL BLUE (TOWEL DISPOSABLE) ×2 IMPLANT
TUBE ANAEROBIC SPECIMEN COL (MISCELLANEOUS) IMPLANT
WATER STERILE IRR 1000ML POUR (IV SOLUTION) ×2 IMPLANT

## 2016-10-26 NOTE — Progress Notes (Signed)
Patient taken to and from OR with Nitric and vent with 2 RT's. Stable throughout. When we returned, nitric weaned to 20 PPM. RT to monitor as needed.

## 2016-10-26 NOTE — Progress Notes (Signed)
Peripherally Inserted Central Catheter/Midline Placement  The IV Nurse has discussed with the patient and/or persons authorized to consent for the patient, the purpose of this procedure and the potential benefits and risks involved with this procedure.  The benefits include less needle sticks, lab draws from the catheter, and the patient may be discharged home with the catheter. Risks include, but not limited to, infection, bleeding, blood clot (thrombus formation), and puncture of an artery; nerve damage and irregular heartbeat and possibility to perform a PICC exchange if needed/ordered by physician.  Alternatives to this procedure were also discussed.  Bard Power PICC patient education guide, fact sheet on infection prevention and patient information card has been provided to patient /or left at bedside.    PICC/Midline Placement Documentation        Scott [name] 10/26/2016, 1:12 PM Consent obtained by Claretha Cooper, RN from family at bedside.

## 2016-10-26 NOTE — Transfer of Care (Signed)
Immediate Anesthesia Transfer of Care Note  Patient: Scott [name]  Procedure(s) Performed: Procedure(s): STERNAL WASHOUT AND DELAYED PRIMARY CLOSURE (N/A) TRANSESOPHAGEAL ECHOCARDIOGRAM (TEE) (N/A)  Patient Location: ICU  Anesthesia Type:General  Level of Consciousness: unresponsive and Patient remains intubated per anesthesia plan  Airway & Oxygen Therapy: Patient remains intubated per anesthesia plan and Patient placed on Ventilator (see vital sign flow sheet for setting)  Post-op Assessment: Report given to RN and Post -op Vital signs reviewed and stable  Post vital signs: Reviewed and stable  Last Vitals:  Vitals:   10/26/16 0800 10/26/16 0812  BP: 104/63 117/63  Pulse: 88   Resp: 19   Temp: (!) 38.4 C     Last Pain:  Vitals:   10/26/16 0400  TempSrc: Core (Comment)         Complications: No apparent anesthesia complications

## 2016-10-26 NOTE — Progress Notes (Addendum)
[place] for Bivalirudin Indication: R/O clot in R-atrium and R/O HIT  Allergies  Allergen Reactions  . Doxycycline Hives  . Penicillins Hives     Has patient had a PCN reaction causing immediate rash, facial/tongue/throat swelling, SOB or lightheadedness with hypotension: No Has patient had a PCN reaction causing severe rash involving mucus membranes or skin necrosis: No Has patient had a PCN reaction that required hospitalization: No Has patient had a PCN reaction occurring within the last 10 years: Yes If all of the above answers are "NO", then may proceed with Cephalosporin use.   . Sulfa Antibiotics Hives    Patient Measurements: Height: _0  (175.3 cm) Weight: 246 lb 7.6 oz (111.8 kg) IBW/kg (Calculated) : 70.7  Vital Signs: Temp: 99.7 F (37.6 C) (12/04 1921) Temp Source: Axillary (12/04 1921) BP: 100/55 (12/04 1953) Pulse Rate: 80 (12/04 1953)  Labs:  Recent Labs  10/25/16 1600 10/26/16 0400  10/26/16 1124 10/26/16 1635 10/26/16 1636 10/26/16 1837  HGB 8.6* 9.1*  < > 9.1* 8.7* 8.2*  --   HCT 25.7* 27.0*  < > 27.4* 26.2* 24.0*  --   PLT 54* 84*  --  98* 118*  --   --   APTT  --  41*  --  42*  --   --  77*  LABPROT 21.9* 22.6*  --  22.2*  --   --   --   INR 1.88 1.96  --  1.91  --   --   --   CREATININE  --  0.86  --   --  1.09 1.00  --   < > = values in this interval not displayed.  Estimated Creatinine Clearance: 84.7 mL/min (by C-G formula based on SCr of 1 mg/dL).    Medications:  Scheduled:  . sodium chloride   Intravenous Once  . [START ON 10/27/2016] acetaminophen  1,000 mg Oral Q6H   Or  . [START ON 10/27/2016] acetaminophen (TYLENOL) oral liquid 160 mg/5 mL  1,000 mg Per Tube Q6H  . [START ON 10/27/2016] aspirin EC  325 mg Oral Daily   Or  . [START ON 10/27/2016] aspirin  324 mg Per Tube Daily  . [START ON 10/27/2016] bisacodyl  10 mg Oral Daily   Or  . [START ON 10/27/2016] bisacodyl  10 mg Rectal Daily   . chlorhexidine gluconate (MEDLINE KIT)  15 mL Mouth Rinse BID  . [START ON 10/27/2016] docusate sodium  200 mg Oral Daily  . famotidine (PEPCID) IV  20 mg Intravenous Q12H  . insulin regular  0-10 Units Intravenous TID WC  . [START ON 10/27/2016] levofloxacin (LEVAQUIN) IV  750 mg Intravenous Q24H  . [START ON 10/27/2016] mouth rinse  15 mL Mouth Rinse QID  . [START ON 10/28/2016] pantoprazole  40 mg Oral Daily  . sodium chloride flush  10-40 mL Intracatheter Q12H  . [START ON 10/27/2016] sodium chloride flush  3 mL Intravenous Q12H    Assessment: 70yo male s/p sternal washout this AM with possible R-atrial clot and HIT, on bivalirudin. -Initial aPTT= 77 and at goal on 0.35m/kg/hr -HIT antibody pending   Goal of Therapy:  APTT 50-85sec Monitor platelets by anticoagulation protocol: Yes   Plan:  -No bivalirudin changes needed -Recheck an aPTT in 2 hours  AHildred Laser Pharm D 10/26/2016 8:25 PM  Addendum -aPTT= 82  Plan -Decrease bivalirudin slightly to 0.09 mg/kg/hr to avoid going over the goal -aPTT daily  AHildred Laser Pharm  D 10/26/2016 10:12 PM

## 2016-10-26 NOTE — OR Nursing (Signed)
0944 First call made to SICU.

## 2016-10-26 NOTE — Progress Notes (Signed)
PULMONARY / CRITICAL CARE MEDICINE   Name: Scott [name] MRN: [id number] DOB: 09-27-1946    ADMISSION DATE:  10/20/2016 CONSULTATION DATE: 12/1  REFERRING MD: Ricard Dillon  CHIEF COMPLAINT:  CAD  HISTORY OF PRESENT ILLNESS:   70 yo former smoker with known CAD and ascending aorta aneurysm, who was taken to OR 11/30 for cabg x 3, aortic root replacement, MVR replacement, thoracic cavity left open and had extended pump time in OR. On APRV ventilation post op, CI 1.6 and multiple pressors. PCCM asked to manage vent.  SUBJECTIVE:  Just back from OR Chest successfully closed On PRVC now 100%, peep 5  VITAL SIGNS: BP 117/63   Pulse 88   Temp (!) 101.1 F (38.4 C)   Resp 19   Ht 5\' 9"  (1.753 m)   Wt 111.8 kg (246 lb 7.6 oz)   SpO2 100%   BMI 36.40 kg/m   HEMODYNAMICS: PAP: (43-60)/(22-31) 45/25 CO:  [4.3 L/min-5.6 L/min] 4.3 L/min CI:  [2 L/min/m2-2.6 L/min/m2] 2 L/min/m2  VENTILATOR SETTINGS: Vent Mode: PRVC FiO2 (%):  [40 %] 40 % Set Rate:  [15 bmp-18 bmp] 16 bmp Vt Set:  [570 mL] 570 mL PEEP:  [5 cmH20] 5 cmH20 Plateau Pressure:  [18 cmH20-23 cmH20] 19 cmH20  INTAKE / OUTPUT: I/O last 3 completed shifts: In: 8405.2 [I.V.:5148.8; Blood:568.3; NG/GT:2058; IV Piggyback:630] Out: [id number] [Urine:5135; Drains:225; Chest Tube:900]  PHYSICAL EXAMINATION: General:  WNWDWM, sedated on vent Neuro: deep post op HEENT: Scleral edema , PERL 3 mm Cardiovascular: s1 s2 RRR distant, no vac, closed Lungs:  Decreased, coarse Abdomen:  Soft , BS low, no r Musculoskeletal:  intact Skin: cool dry  LABS:  BMET  Recent Labs Lab 10/24/16 0510 10/25/16 0500 10/26/16 0400  NA 141 141 144  K 3.7 3.2* 3.2*  CL 110 107 107  CO2 23 27 28   BUN 16 20 27*  CREATININE 0.97 0.80 0.86  GLUCOSE 105* 137* 88    Electrolytes  Recent Labs Lab 10/23/16 1615 10/24/16 0510 10/25/16 0500 10/26/16 0400  CALCIUM  --  8.1* 7.8* 7.8*  MG 2.0  --  1.7 1.8  PHOS  --   --  1.9* 1.7*     CBC  Recent Labs Lab 10/25/16 0500 10/25/16 1600 10/26/16 0400  WBC 5.3 4.5 6.5  HGB 9.5* 8.6* 9.1*  HCT 27.5* 25.7* 27.0*  PLT 50* 54* 84*    Coag's  Recent Labs Lab 10/22/16 2155 10/25/16 0500 10/25/16 1600 10/26/16 0400  APTT 47* 43*  --  41*  INR 1.74 2.15 1.88 1.96    Sepsis Markers No results for input(s): LATICACIDVEN, PROCALCITON, O2SATVEN in the last 168 hours.  ABG  Recent Labs Lab 10/25/16 0508 10/26/16 0420 10/26/16 0510  PHART 7.412 PENDING 7.462*  PCO2ART 44.5 PENDING 41.9  PO2ART 112.0* PENDING 103    Liver Enzymes  Recent Labs Lab 10/21/16 0217 10/24/16 0510 10/25/16 0500  AST 28 1,215* 643*  ALT 22 1,072* 913*  ALKPHOS 62 39 49  BILITOT 0.6 0.9 0.9  ALBUMIN 3.6 2.8* 2.4*    Cardiac Enzymes No results for input(s): TROPONINI, PROBNP in the last 168 hours.  Glucose  Recent Labs Lab 10/26/16 0312 10/26/16 0412 10/26/16 0456 10/26/16 0608 10/26/16 0654 10/26/16 0803  GLUCAP 80 89 90 104* 95 92    Imaging Dg Chest Port 1 View  Result Date: 10/26/2016 CLINICAL DATA:  Respiratory failure, recent cardiac surgery. EXAM: PORTABLE CHEST 1 VIEW COMPARISON:  Portable chest x-ray of October 25, 2016 FINDINGS: The lungs are reasonably well inflated. There is no pneumothorax. The cardiac silhouette is enlarged. The pulmonary vascularity is engorged. The interstitial markings remain moderately increased bilaterally. Pleural fluid is likely present at the lung bases. Bilateral chest tubes remain in place as does a mediastinal drain. The endotracheal tube tip lies approximately 6.5 cm above the carina. A Swan-Ganz catheter has its tip in the proximal main pulmonary artery trunk. A prosthetic aortic valve is visible. Presumed packing material is noted in the left paratracheal region and appears unchanged. The esophagogastric tube tip projects below the inferior margin of the image. IMPRESSION: Slight improved aeration today. Persistent  interstitial edema bilaterally with bibasilar atelectasis and probable layering of pleural fluid on the right. Correlation as to the adequacy of the positioning of the Swan-Ganz catheter is needed. The other support tubes are in reasonable position. Electronically Signed   By: David  Martinique M.D.   On: 10/26/2016 07:33     STUDIES:    CULTURES: 12-1 bc >>  ANTIBIOTICS: 11/30 levaquin>>  SIGNIFICANT EVENTS: 11/30 OHS>>  LINES/TUBES: 11.30 OTT>> 11/30 rt I j PA cath>> 11/30 l rad aline>> 11/30 CT x 4>> 11/30 wound vac>>  DISCUSSION: 70 yo former smoker with known CAD and ascending aorta aneurysm, who was taken to OR 11/30 for cabg x 3, aortic root replacement, MVR replacement, thoracic cavity left open and had extended pump time in OR. On APRV ventilation post op, CI 1.6 and multiple pressors. PCCM asked to manage vent.  ASSESSMENT / PLAN:  PULMONARY A: VDRF secondary to extensive OHS 11/30,  prolonged pump time Former smoker Chest closed 12/4 ALI P:   Stat pcxr now post op ABG now on 100% peep 5 Keep same MV on vent for now Keep plat less 30, given bilateral infiltrates Would favor even to neg balance, pending pao2 reserve on ABG ABg this am reviewed also, likely will need TV/ MV reduction likely to dc nitric in pm, pending progress with O2 needs today  CARDIOVASCULAR A:  Post cabg x 3, MVR, ascending aortic root repalcement Open chest wound Hypotension CAD CI 1.6->2.2 P:  Wean pressor as possible- levo per CVTS Pa d on low side, re evaluate lasix needs  RENAL Lab Results  Component Value Date   CREATININE 0.86 10/26/2016   CREATININE 0.80 10/25/2016   CREATININE 0.97 10/24/2016   CREATININE 0.87 10/19/2016    A:   HypoK hypophos P:   supp phos k given bemt in pm Lasix per cvts, re assess vent needs prior to this  GASTROINTESTINAL A:   Hx of Gerd  P:   PPI NPO  HEMATOLOGIC  Recent Labs  10/25/16 1600 10/26/16 0400  HGB 8.6* 9.1*     A:   No acute issues Mild thrombocytopenia 89->50 P:  Trend  hematology labs  scd  INFECTIOUS A:   Open chest cavity  P:   Levaquin per cvts, pcxr follow up  ENDOCRINE CBG (last 3)   Recent Labs  10/26/16 0608 10/26/16 0654 10/26/16 0803  GLUCAP 104* 95 92     A:   DM P:   SSI Insulin drip  NEUROLOGIC A:   Post OHS with prolonged pump time 11/30 Awake and follows commands 12/1 P:   RASS goal: -2 Sedated post op WUA , slow reduction Would favor reduction and dc precedex  FAMILY  - Updates: None at bedside  - Inter-disciplinary family meet or Palliative Care meeting due by: 12/8   CCT=35 min  Lavon Paganini. Titus Mould, MD, [place] Pgr: [facility]

## 2016-10-26 NOTE — Progress Notes (Addendum)
ANTICOAGULATION CONSULT NOTE - Initial Consult  Pharmacy Consult for Bivalirudin Indication: R/O clot in R-atrium and R/O HIT  Allergies  Allergen Reactions  . Doxycycline Hives  . Penicillins Hives     Has patient had a PCN reaction causing immediate rash, facial/tongue/throat swelling, SOB or lightheadedness with hypotension: No Has patient had a PCN reaction causing severe rash involving mucus membranes or skin necrosis: No Has patient had a PCN reaction that required hospitalization: No Has patient had a PCN reaction occurring within the last 10 years: Yes If all of the above answers are "NO", then may proceed with Cephalosporin use.   . Sulfa Antibiotics Hives    Patient Measurements: Height: 5\' 9"  (175.3 cm) Weight: 246 lb 7.6 oz (111.8 kg) IBW/kg (Calculated) : 70.7  Vital Signs: Temp: 101.1 F (38.4 C) (12/04 0800) Temp Source: Core (Comment) (12/04 0400) BP: 110/58 (12/04 1112) Pulse Rate: 88 (12/04 0800)  Labs:  Recent Labs  10/24/16 0510  10/25/16 0500 10/25/16 1600 10/26/16 0400 10/26/16 1116 10/26/16 1124  HGB 10.5*  --  9.5* 8.6* 9.1* 8.8* 9.1*  HCT 30.2*  --  27.5* 25.7* 27.0* 26.0* 27.4*  PLT 89*  --  50* 54* 84*  --  98*  APTT  --   --  43*  --  41*  --  42*  LABPROT  --   < > 24.3* 21.9* 22.6*  --  22.2*  INR  --   < > 2.15 1.88 1.96  --  1.91  CREATININE 0.97  --  0.80  --  0.86  --   --   < > = values in this interval not displayed.  Estimated Creatinine Clearance: 98.5 mL/min (by C-G formula based on SCr of 0.86 mg/dL).   Medical History: Past Medical History:  Diagnosis Date  . Arthritis    "hips, shoulders; knees; back" (10/20/2016)  . Bicuspid aortic valve   . BPH (benign prostatic hypertrophy)   . Carpal tunnel syndrome of right wrist   . Coronary artery disease involving native coronary artery of native heart with unstable angina pectoris ([place]) 10/20/2016  . Diverticulitis   . GERD (gastroesophageal reflux disease)   . Gout    . Heart murmur   . Hyperlipemia   . Hypertension   . Postoperative atrial fibrillation ([place]) 10/24/2016  . RLS (restless legs syndrome)   . S/P aortic valve replacement with bioprosthetic valve 10/22/2016   25 mm [facility] Ease bovine pericardial bioprosthetic tissue valve  . S/P ascending aortic aneurysm repair 10/22/2016   28 mm supracoronary straight graft replacement of ascending thoracic aortic aneurysm  . S/P CABG x 3 10/22/2016   Sequential LIMA to Diag and LAD, SVG to distal LAD, open vein harvest right thigh  . S/P mitral valve repair 10/22/2016   Artificial Gore-tex neochord placement x6 - posterior annuloplasty band placed but removed due to systolic anterior motion of mitral valve  . Severe aortic stenosis   . Snores    Never been tested for sleep apnea  . Thoracic ascending aortic aneurysm ([place]) 10/21/2016  . Thrombocytopenia ([place]) 10/22/2016  . Type II diabetes mellitus ([facility])     Medications:  Scheduled:  . [START ON 10/27/2016] acetaminophen  1,000 mg Oral Q6H   Or  . [START ON 10/27/2016] acetaminophen (TYLENOL) oral liquid 160 mg/5 mL  1,000 mg Per Tube Q6H  . acetaminophen (TYLENOL) oral liquid 160 mg/5 mL  650 mg Per Tube Once   Or  . acetaminophen  650 mg Rectal Once  . [START ON 10/27/2016] aspirin EC  325 mg Oral Daily   Or  . [START ON 10/27/2016] aspirin  324 mg Per Tube Daily  . [START ON 10/27/2016] bisacodyl  10 mg Oral Daily   Or  . [START ON 10/27/2016] bisacodyl  10 mg Rectal Daily  . [START ON 10/27/2016] docusate sodium  200 mg Oral Daily  . famotidine (PEPCID) IV  20 mg Intravenous Q12H  . insulin regular  0-10 Units Intravenous TID WC  . [START ON 10/27/2016] levofloxacin (LEVAQUIN) IV  750 mg Intravenous Q24H  . magnesium sulfate  4 g Intravenous Once  . [START ON 10/28/2016] pantoprazole  40 mg Oral Daily  . potassium chloride (KCL MULTIRUN) 30 mEq in 265 mL IVPB  30 mEq Intravenous Once  . [START ON 10/27/2016] sodium chloride flush  3 mL  Intravenous Q12H  . sodium phosphate  Dextrose 5% IVPB  20 mmol Intravenous Once  . vancomycin  1,000 mg Intravenous Once    Assessment: 70yo male s/p sternal washout this AM with possible R-atrial clot and HIT, to start Argatroban this evening.  4Ts score 6- possible clot, pltc nadir 50 (after initially improving s/p open heart surgery on 11/30) & > 50% drop from baseline.    Pt with shock liver this admit, and no liver enzymes this AM- added on to this AMs BMet, improving but remain elevated.  INR is essentially not changing.  Per d/w DrOwen, will change to Bivalirudin.  Due to minimal drainage, ok to start at 4PM instead of 6PM.  Goal of Therapy:  APTT 50-85sec Monitor platelets by anticoagulation protocol: Yes   Plan:  Bivalirudin 0.1mg /kg/hr Check aPTT 2hr after started, then q4h until therapeutic x 2 Daily aPTT, CBC Aim for lower end of goal PTT range per DrOwen Watch for s/s of bleeding.   Gracy Bruins, PharmD Clinical Pharmacist [facility]

## 2016-10-26 NOTE — Brief Op Note (Signed)
10/20/2016 - 10/26/2016  10:10 AM  PATIENT:  Scott [name]  70 y.o. male  PRE-OPERATIVE DIAGNOSIS:  OPEN CHEST  POST-OPERATIVE DIAGNOSIS:  OPEN CHEST  PROCEDURE:  Procedure(s): STERNAL WASHOUT AND DELAYED PRIMARY CLOSURE (N/A) TRANSESOPHAGEAL ECHOCARDIOGRAM (TEE) (N/A)  SURGEON:  Surgeon(s) and Role:    * Rexene Alberts, MD - Primary  ASSISTANTS: Dineen Kid, CRNFA  ANESTHESIA:   Rica Koyanagi, MD  EBL:  Total I/O In: 121.9 [I.V.:121.9] Out: 175 [Urine:175]  BLOOD ADMINISTERED:none  DRAINS: 4 Chest Tube(s) in the mediastinum and both pleural spaces   LOCAL MEDICATIONS USED:  NONE  SPECIMEN:  No Specimen  DISPOSITION OF SPECIMEN:  N/A  COUNTS:  YES  TOURNIQUET:  * No tourniquets in log *  DICTATION: .Note written in EPIC  PLAN OF CARE: Admit to inpatient   PATIENT DISPOSITION:  ICU - intubated and hemodynamically stable.   Rexene Alberts, MD 10/26/2016 10:12 AM

## 2016-10-26 NOTE — Progress Notes (Signed)
Patient ID: Scott [name], male   DOB: 1946/06/19, 70 y.o.   MRN: [id number] EVENING ROUNDS NOTE :     [address]       [place] [address]             [phone]                 Day of Surgery Procedure(s) (LRB): STERNAL WASHOUT AND DELAYED PRIMARY CLOSURE (N/A) TRANSESOPHAGEAL ECHOCARDIOGRAM (TEE) (N/A)  Total Length of Stay:  LOS: 6 days  BP (!) 101/52   Pulse 80   Temp 99.7 F (37.6 C) (Axillary)   Resp 14   Ht 5\' 9"  (1.753 m)   Wt 246 lb 7.6 oz (111.8 kg)   SpO2 99%   BMI 36.40 kg/m   .Intake/Output      12/04 0701 - 12/05 0700   I.V. (mL/kg) 2780.6 (24.9)   Blood 551   NG/GT    IV Piggyback 250   Total Intake(mL/kg) 3581.6 (32)   Urine (mL/kg/hr) 845 (0.5)   Emesis/NG output    Drains    Blood 50 (0)   Chest Tube 560 (0.3)   Total Output 1455   Net +2126.6         . sodium chloride 20 mL/hr at 10/26/16 2000  . amiodarone 30 mg/hr (10/26/16 2000)  . bivalirudin (ANGIOMAX) infusion 0.5 mg/mL (Non-ACS indications) 0.1 mg/kg/hr (10/26/16 2000)  . dexmedetomidine 1.2 mcg/kg/hr (10/26/16 2100)  . fentaNYL infusion INTRAVENOUS 200 mcg/hr (10/26/16 2000)  . furosemide (LASIX) infusion 8 mg/hr (10/26/16 2000)  . insulin (NOVOLIN-R) infusion 1.3 Units/hr (10/26/16 2100)  . milrinone 0.3 mcg/kg/min (10/26/16 2000)  . nitroGLYCERIN    . norepinephrine (LEVOPHED) Adult infusion 3 mcg/min (10/26/16 2000)  . phenylephrine (NEO-SYNEPHRINE) Adult infusion       Lab Results  Component Value Date   WBC 8.5 10/26/2016   HGB 8.2 (L) 10/26/2016   HCT 24.0 (L) 10/26/2016   PLT 118 (L) 10/26/2016   GLUCOSE 105 (H) 10/26/2016   CHOL 134 10/21/2016   TRIG 203 (H) 10/21/2016   HDL 28 (L) 10/21/2016   LDLCALC 65 10/21/2016   ALT 540 (H) 10/26/2016   AST 229 (H) 10/26/2016   NA 144 10/26/2016   K 3.8 10/26/2016   CL 105 10/26/2016   CREATININE 1.00 10/26/2016   BUN 32 (H) 10/26/2016   CO2 25 10/26/2016   TSH 1.93 10/19/2016   INR 1.91  10/26/2016   HGBA1C 6.6 (H) 10/21/2016   Sternum closed today, stable remains on vent with NO Remains on drips including angio max   [name] MD  Beeper [phone] Office [phone] 10/26/2016 9:44 PM

## 2016-10-26 NOTE — Progress Notes (Signed)
Advanced Heart Failure Rounding Note  Referring Physician: Dr Roxy Manns Primary Physician: Dr Quintin Alto Primary Cardiologist:  Dr Percival Spanish   Reason for Consultation: Heart Failure   Subjective:    Admitted with chest pain. RHC//LHC with 3 vessel disease. Taken to the OR 11/30 for cabg x 3, repair of thoracic ascending aneurysm, AVR bioprosthetic,  MVR, and open chest with VAC placement.    Chest successfully closed this am.   Coox 62%. Repeat pending.   Remains on amio drip 30 mg/hr, nor-epi 4 mcg and milrinone 0.3 mcg/kg/min currently.  Pt remains intubated and sedated.    Has been on lasix 8 mg/hr.  Held for surgery this am. Overall + 1.3 L in past 24 hrs. Weight remains well above pre-op weight.  CVP 22  Objective:   Weight Range: 246 lb 7.6 oz (111.8 kg) Body mass index is 36.4 kg/m.   Vital Signs:   Temp:  [100.2 F (37.9 C)-101.8 F (38.8 C)] 101.1 F (38.4 C) (12/04 0800) Pulse Rate:  [88-90] 88 (12/04 0800) Resp:  [10-22] 19 (12/04 0800) BP: (92-133)/(54-76) 117/63 (12/04 0812) SpO2:  [91 %-100 %] 100 % (12/04 0800) Arterial Line BP: (98-157)/(51-73) 107/59 (12/04 0800) FiO2 (%):  [40 %] 40 % (12/04 0812) Weight:  [246 lb 7.6 oz (111.8 kg)] 246 lb 7.6 oz (111.8 kg) (12/04 0400) Last BM Date: 10/19/16  Weight change: Filed Weights   10/24/16 0500 10/25/16 0500 10/26/16 0400  Weight: 242 lb 15.2 oz (110.2 kg) 238 lb 8.6 oz (108.2 kg) 246 lb 7.6 oz (111.8 kg)    Intake/Output:   Intake/Output Summary (Last 24 hours) at 10/26/16 1052 Last data filed at 10/26/16 1045  Gross per 24 hour  Intake          6301.75 ml  Output             4135 ml  Net          2166.75 ml     Physical Exam: CVP 17 General:  Intubated and sedated.  HEENT: Normal Neck: supple. JVP to jaw. Carotids 2+ bilat; no bruits. No thyromegaly or nodule noted.  RIJ swan Cor: PMI nondisplaced. RRR. No M/G/R noted. VAC in place and Chest tube x 4.  Pacing wires remain in place.  Lungs:  Mechanical breath sounds present. + Rhonchi Abdomen: soft, NT, ND, no HSM. No bruits or masses. +BS  Extremities: no cyanosis, clubbing, rash, edema. L radial aline Neuro: Intubated, sedated.  GU: Foley in place  Telemetry: Reviewed, A pacing in 80s  Labs: CBC  Recent Labs  10/25/16 1600 10/26/16 0400  WBC 4.5 6.5  HGB 8.6* 9.1*  HCT 25.7* 27.0*  MCV 87.1 87.9  PLT 54* 84*   Basic Metabolic Panel  Recent Labs  10/25/16 0500 10/26/16 0400  NA 141 144  K 3.2* 3.2*  CL 107 107  CO2 27 28  GLUCOSE 137* 88  BUN 20 27*  CREATININE 0.80 0.86  CALCIUM 7.8* 7.8*  MG 1.7 1.8  PHOS 1.9* 1.7*   Liver Function Tests  Recent Labs  10/24/16 0510 10/25/16 0500  AST 1,215* 643*  ALT 1,072* 913*  ALKPHOS 39 49  BILITOT 0.9 0.9  PROT 4.5* 4.4*  ALBUMIN 2.8* 2.4*   No results for input(s): LIPASE, AMYLASE in the last 72 hours. Cardiac Enzymes No results for input(s): CKTOTAL, CKMB, CKMBINDEX, TROPONINI in the last 72 hours.  BNP: BNP (last 3 results) No results for input(s): BNP in the last  8760 hours.  ProBNP (last 3 results) No results for input(s): PROBNP in the last 8760 hours.   D-Dimer No results for input(s): DDIMER in the last 72 hours. Hemoglobin A1C No results for input(s): HGBA1C in the last 72 hours. Fasting Lipid Panel No results for input(s): CHOL, HDL, LDLCALC, TRIG, CHOLHDL, LDLDIRECT in the last 72 hours. Thyroid Function Tests No results for input(s): TSH, T4TOTAL, T3FREE, THYROIDAB in the last 72 hours.  Invalid input(s): FREET3  Other results:     Imaging/Studies:  Dg Chest Port 1 View  Result Date: 10/26/2016 CLINICAL DATA:  Respiratory failure, recent cardiac surgery. EXAM: PORTABLE CHEST 1 VIEW COMPARISON:  Portable chest x-ray of October 25, 2016 FINDINGS: The lungs are reasonably well inflated. There is no pneumothorax. The cardiac silhouette is enlarged. The pulmonary vascularity is engorged. The interstitial markings remain  moderately increased bilaterally. Pleural fluid is likely present at the lung bases. Bilateral chest tubes remain in place as does a mediastinal drain. The endotracheal tube tip lies approximately 6.5 cm above the carina. A Swan-Ganz catheter has its tip in the proximal main pulmonary artery trunk. A prosthetic aortic valve is visible. Presumed packing material is noted in the left paratracheal region and appears unchanged. The esophagogastric tube tip projects below the inferior margin of the image. IMPRESSION: Slight improved aeration today. Persistent interstitial edema bilaterally with bibasilar atelectasis and probable layering of pleural fluid on the right. Correlation as to the adequacy of the positioning of the Swan-Ganz catheter is needed. The other support tubes are in reasonable position. Electronically Signed   By: David  Martinique M.D.   On: 10/26/2016 07:33   Dg Chest Port 1 View  Result Date: 10/25/2016 CLINICAL DATA:  Respiratory failure. EXAM: PORTABLE CHEST 1 VIEW COMPARISON:  10/24/2016. FINDINGS: Endotracheal tube in satisfactory position. Nasogastric tube extending into the stomach. Mediastinal and left chest tubes remain in place. Right jugular Swan-Ganz catheter tip in the proximal right main pulmonary artery. Stable curvilinear density projected at the medial aspect of the left upper lung zone and adjacent mediastinum. A prosthetic heart valve is again demonstrated. Mildly progressive enlargement of the cardiac silhouette and diffuse prominence of the pulmonary vasculature and interstitial markings. Interval mild right basilar atelectasis and stable mild left basilar atelectasis. Unremarkable bones. IMPRESSION: 1. Stable curvilinear density to the left of the upper mediastinum. This could represent packing material. Clinical correlation is necessary to exclude a retained foreign body. 2. Stable mediastinal and left chest tubes without pneumothorax. 3. Interval mild right basilar atelectasis  and stable mild left basilar atelectasis. 4. Mildly progressive cardiomegaly and mild changes of congestive heart failure. Electronically Signed   By: Claudie Revering M.D.   On: 10/25/2016 09:23     Latest Echo  Latest Cath   Medications:     Scheduled Medications: . sodium chloride   Intravenous Once  . acetaminophen  1,000 mg Oral Q6H   Or  . acetaminophen (TYLENOL) oral liquid 160 mg/5 mL  1,000 mg Per Tube Q6H  . aspirin EC  325 mg Oral Daily   Or  . aspirin  324 mg Per Tube Daily  . bisacodyl  10 mg Oral Daily   Or  . bisacodyl  10 mg Rectal Daily  . chlorhexidine gluconate (MEDLINE KIT)  15 mL Mouth Rinse BID  . docusate sodium  200 mg Oral Daily  . famotidine (PEPCID) IV  20 mg Intravenous Q12H  . feeding supplement (VITAL HIGH PROTEIN)  1,000 mL Per Tube Q24H  .  insulin regular  0-10 Units Intravenous TID WC  . mouth rinse  15 mL Mouth Rinse QID  . sodium chloride flush  3 mL Intravenous Q12H  . sodium phosphate  Dextrose 5% IVPB  20 mmol Intravenous Once     Infusions: . sodium chloride 20 mL/hr at 10/26/16 0800  . sodium chloride    . sodium chloride    . amiodarone 30 mg/hr (10/26/16 0851)  . insulin (NOVOLIN-R) infusion 1.3 Units/hr (10/26/16 0851)  . lactated ringers 20 mL/hr at 10/26/16 0800  . lactated ringers Stopped (10/23/16 2000)  . milrinone 0.3 mcg/kg/min (10/26/16 0851)  . nitroGLYCERIN    . phenylephrine (NEO-SYNEPHRINE) Adult infusion Stopped (10/23/16 1000)     PRN Medications:  sodium chloride, ipratropium-albuterol, lactated ringers, metoprolol, midazolam, morphine injection, ondansetron (ZOFRAN) IV, sodium chloride flush   Assessment/Plan   Scott [name] is 70 year old admitted with CP. Complex operation this admission.  CABG x 3 + bioprosthetic AVR for severe bicuspid AS + ascending aorta replacement + MV repair for flail P2.  MV repair complicated by severe SAM, ended up having to remove posterior annuloplasty band.  Chest closed on 12/3.   1. CAD- 10/22/16 S/P CABG x3 with chest left open.  Chest successfully closed am of 10/26/16. - LV EF 60-65% with severe LVH and normal RV function on echo pre-op, LV function normal on intra-op TEE.  - CVP 22.   - On lasix gtt at 8 mg/hr, held for OR this am, not yet resumed.  Will resume at 12 mg/hr for now.  -  Remains on Norepi 4 mcg, Milrinone 0.3 mcg. Neo off currently. (was at 11 ml this am.)   2. Severe Aortic Stenosis - 11/302017 S/P AVR bioprostheic  3. Flail Posterior Leaflet - s/p MV repair.  4. VDRF- CCM following for vent management.  5. Former Smoker 6. Atrial fibrillation: Persistent, on amiodarone.   Pt remains critically ill.  Volume overloaded with weight well above pre-op weight. Continue to diurese as tolerated.   Length of Stay: [id number]  [place], [name] 10/26/2016, 10:52 AM  Advanced Heart Failure Team Pager [phone] (M-F; 7a - 4p)  Please contact [facility] for night-coverage after hours (4p -7a ) and weekends on [email or web address]  Patient seen with PA, agree with the above note.    TEE from today reviewed: EF 40-45%, small cavity size, flattened/hypokinetic septum, s/p MV repair with mild Scott, mild to moderately decreased RV systolic function.   He remains on milrinone 0.3 + amiodarone + norepinephrine 4.  BP stable.  Good co-ox pre-op today (chest closed), post-op co-ox lower => 56% from PICC line.  Continue current meds for now.  Removing Swan given questions about accuracy.   He is in atrial fibrillation persistently.   CVP measured at bedside, was 17.  Would continue Lasix 8 mg/hr for now, good UOP with this but actually not net negative yesterday.   Loralie Champagne 10/26/2016 3:05 PM

## 2016-10-26 NOTE — OR Nursing (Signed)
1003 Second call made to SICU.

## 2016-10-26 NOTE — Anesthesia Postprocedure Evaluation (Signed)
Anesthesia Post Note  Patient: Scott [name]  Procedure(s) Performed: Procedure(s) (LRB): STERNAL WASHOUT AND DELAYED PRIMARY CLOSURE (N/A) TRANSESOPHAGEAL ECHOCARDIOGRAM (TEE) (N/A)  Patient location during evaluation: SICU Anesthesia Type: General Level of consciousness: sedated and patient remains intubated per anesthesia plan Pain management: pain level controlled Vital Signs Assessment: post-procedure vital signs reviewed and stable Respiratory status: patient remains intubated per anesthesia plan Cardiovascular status: stable Anesthetic complications: no    Last Vitals:  Vitals:   10/26/16 0812 10/26/16 1112  BP: 117/63 (!) 110/58  Pulse:    Resp:    Temp:      Last Pain:  Vitals:   10/26/16 0400  TempSrc: Core (Comment)                 Ophelia Sipe,JAMES TERRILL

## 2016-10-26 NOTE — Op Note (Addendum)
CARDIOTHORACIC SURGERY OPERATIVE NOTE  Date of Procedure:   10/26/2016  Preoperative Diagnosis:  Open chest, s/p AVR, mitral valve repair, CABG x3 and repair of ascending thoracic aortic aneurysm  Postoperative Diagnosis:  same  Procedure:    Sternal washout and delayed primary closure  Surgeon:    Valentina Gu. Roxy Manns, MD  Assistant:    Dineen Kid, CRNFA  Anesthesia:    Rica Koyanagi, MD  Operative Findings:   Low normal LV systolic function with mild hypokinesis of the anterior septum  Mild RV chamber enlargement and systolic dysfunction  Normal functioning bioprosthetic tissue valve in aortic position  Mild-moderate (1+/2+) mitral regurgitation  Mobile densities in right atrium suspicious for blood clot     DETAILS OF THE OPERATIVE PROCEDURE  The patient is brought to the operating room on the above mentioned date and placed in the supine position on the operating table.  General endotracheal anesthesia is monitored throughout the procedure under the care and direction of Dr. Orene Desanctis.  Baseline transesophageal echocardiogram is performed by Dr. Orene Desanctis.  There is low normal left ventricular systolic function with hypokinesis of the anterior interventricular septum.  There are no other wall motion abnormalities noted and overall ventricular contractility appears vigorous. There is mild right ventricular chamber enlargement and systolic dysfunction. There is a bioprosthetic tissue valve in the aortic position that is functioning normally. There is mild mitral regurgitation. There are several large mobile echogenic densities within the right atrium suspicious for blood clot versus vegetation. There are no similar findings in the left heart circulation.  The patient's anterior chest is prepared and draped in a sterile manner. The Esmarch dressing is removed. The mediastinum was explored. There is trivial clot present. Previous laparotomy pads left in place at the time of the  patient's original surgery are removed. The mediastinum is irrigated using a pulse lavage irrigation device with warm antibiotic-containing solution. The sternum was closed using double strength sternal wire. Soft tissues anterior to the sternum were closed in multiple layers and the skin is closed with a subcuticular skin closure.  The patient tolerated sternal closure uneventfully with no significant change in airway pressures, hemodynamics, or oxygenation. The patient is transported back to the surgical intensive care unit in stable condition. Estimated blood loss was trivial. All sponge, instrument and needle counts were verified correct. There were no complications.  Due to the findings of large mobile densities in right atrium seen on TEE and the associated history of profound thrombocytopenia with extensive clot formation in the oxygenator during cardiopulmonary bypass at the time of the patient's original surgery, an HIT panel will be sent and the patient will be treated using argatroban for presumed HITT.  In addition, empiric antibiotics will be ordered to cover the possibility of bacterial endocarditis due to line sepsis, although this seems much less likely.  A PICC line will be placed so that the existing central lines can be removed as soon as possible.   Valentina Gu. Roxy Manns MD 10/26/2016 10:21 AM

## 2016-10-26 NOTE — Progress Notes (Signed)
[address]       [place] [address]             [phone]        CARDIOTHORACIC SURGERY PROGRESS NOTE   R4 Days Post-Op Procedure(s) (LRB): CORONARY ARTERY BYPASS GRAFTING (CABG)x 2 WITH LIMA TO DIAGONAL, OPEN  HARVESTING OF RIGHT SAPHENOUS VEIN FOR VEIN GRAFT TO LAD (N/A) AORTIC VALVE REPLACEMENT (AVR) WITH SIZE 25 MM MAGNA EASE PERICARDIAL BIOPROSTHESIS - AORTIC (N/A) TRANSESOPHAGEAL ECHOCARDIOGRAM (TEE) (N/A) MITRAL VALVE REPAIR (MVR) WITH SIZE 30 SORIN ANNULOFLEX ANNULOPLASTY RING WITH SUBSEQUENT REMOVAL OF RING (N/A) ASCENDING AORTIC  ANEURYSM REPAIR (AAA) WITH 28 MM HEMASHIELD PLATINUM WOVEN DOUBLE VELOUR VASCULAR GRAFT  Subjective: Sedated on vent  Objective: Vital signs: BP Readings from Last 1 Encounters:  10/26/16 117/63   Pulse Readings from Last 1 Encounters:  10/26/16 88   Resp Readings from Last 1 Encounters:  10/26/16 19   Temp Readings from Last 1 Encounters:  10/26/16 (!) 101.1 F (38.4 C)    Hemodynamics: PAP: (41-60)/(21-31) 45/25 CO:  [4.3 L/min-5.6 L/min] 4.3 L/min CI:  [2 L/min/m2-2.6 L/min/m2] 2 L/min/m2  Mixed venous co-ox 62%   Physical Exam:  Rhythm:   Afib - VVI pacing  Breath sounds: Coarse but clear, minimal airway secretions  Heart sounds:  RRR  Incisions:  Wound VAC intact  Abdomen:  Soft, non-distended, non-tender  Extremities:  Warm, well-perfused  Chest tubes:  low volume thin serosanguinous output, no air leak    Intake/Output from previous day: 12/03 0701 - 12/04 0700 In: 5809.9 [I.V.:3453.5; Blood:568.3; NG/GT:1158; IV [id number] Out: [id number] [Urine:3585; Drains:150; Chest Tube:690] Intake/Output this shift: Total I/O In: 121.9 [I.V.:121.9] Out: 175 [Urine:175]  Lab Results:  CBC: Recent Labs  10/25/16 1600 10/26/16 0400  WBC 4.5 6.5  HGB 8.6* 9.1*  HCT 25.7* 27.0*  PLT 54* 84*    BMET:  Recent Labs  10/25/16 0500 10/26/16 0400  NA 141 144  K 3.2* 3.2*  CL 107 107  CO2  27 28  GLUCOSE 137* 88  BUN 20 27*  CREATININE 0.80 0.86  CALCIUM 7.8* 7.8*     PT/INR:   Recent Labs  10/26/16 0400  LABPROT 22.6*  INR 1.96    CBG (last 3)   Recent Labs  10/26/16 0456 10/26/16 0608 10/26/16 0654  GLUCAP 90 104* 95    ABG    Component Value Date/Time   PHART 7.462 (H) 10/26/2016 0510   PCO2ART 41.9 10/26/2016 0510   PO2ART 103 10/26/2016 0510   HCO3 29.5 (H) 10/26/2016 0510   TCO2 29 10/25/2016 0508   ACIDBASEDEF PENDING 10/26/2016 0420   O2SAT 98.3 10/26/2016 0510    CXR: PORTABLE CHEST 1 VIEW  COMPARISON:  Portable chest x-ray of October 25, 2016  FINDINGS: The lungs are reasonably well inflated. There is no pneumothorax. The cardiac silhouette is enlarged. The pulmonary vascularity is engorged. The interstitial markings remain moderately increased bilaterally. Pleural fluid is likely present at the lung bases. Bilateral chest tubes remain in place as does a mediastinal drain. The endotracheal tube tip lies approximately 6.5 cm above the carina. A Swan-Ganz catheter has its tip in the proximal main pulmonary artery trunk. A prosthetic aortic valve is visible. Presumed packing material is noted in the left paratracheal region and appears unchanged. The esophagogastric tube tip projects below the inferior margin of the image.  IMPRESSION: Slight improved aeration today. Persistent interstitial edema bilaterally with bibasilar atelectasis and probable layering  of pleural fluid on the right. Correlation as to the adequacy of the positioning of the Swan-Ganz catheter is needed. The other support tubes are in reasonable position.   Electronically Signed   By: David  Martinique M.D.   On: 10/26/2016 07:33  Assessment/Plan: S/P Procedure(s) (LRB): CORONARY ARTERY BYPASS GRAFTING (CABG)x 2 WITH LIMA TO DIAGONAL, OPEN  HARVESTING OF RIGHT SAPHENOUS VEIN FOR VEIN GRAFT TO LAD (N/A) AORTIC VALVE REPLACEMENT (AVR) WITH SIZE 25 MM MAGNA  EASE PERICARDIAL BIOPROSTHESIS - AORTIC (N/A) TRANSESOPHAGEAL ECHOCARDIOGRAM (TEE) (N/A) MITRAL VALVE REPAIR (MVR) WITH SIZE 30 SORIN ANNULOFLEX ANNULOPLASTY RING WITH SUBSEQUENT REMOVAL OF RING (N/A) ASCENDING AORTIC  ANEURYSM REPAIR (AAA) WITH 28 MM HEMASHIELD PLATINUM WOVEN DOUBLE VELOUR VASCULAR GRAFT  Clinically stable Post-cardiotomy shock - resolved - currently in Afib under pacer w/ stable hemodynamics, PA pressures relatively low, stable cardiac index and co-ox 62% VDFR - much improved oxygenation w/ O2 sats 100% on 40% FiO2 and PEEP=5, CXR looks good Acute combined systolic and diastolic CHF with expected post-op volume excess, I/O's positive yesterday but down 8 liters since original surgery, weight still > preop Post op atrial fibrillation on IV amiodarone Expected post op acute blood loss anemia, Hgb up 9.1 Post op thrombocytopenia, platelet count up 84k Elevated transaminases and prothrombin time w/ shock liver - resolving Low grade fevers w/out leukocytosis, likely reactive -  No signs of ongoing infection   Return to OR today for sternal washout and possible closing  Wean nitric oxide and vent once sternum closed    Rexene Alberts, MD 10/26/2016 8:35 AM

## 2016-10-26 NOTE — Progress Notes (Signed)
  Echocardiogram Echocardiogram Transesophageal has been performed.  Scott [name] 10/26/2016, 9:38 AM

## 2016-10-27 ENCOUNTER — Encounter (HOSPITAL_COMMUNITY): Payer: Self-pay | Admitting: Thoracic Surgery (Cardiothoracic Vascular Surgery)

## 2016-10-27 ENCOUNTER — Inpatient Hospital Stay (HOSPITAL_COMMUNITY): Payer: Medicare Other

## 2016-10-27 DIAGNOSIS — T8111XA Postprocedural  cardiogenic shock, initial encounter: Secondary | ICD-10-CM

## 2016-10-27 DIAGNOSIS — R740 Nonspecific elevation of levels of transaminase and lactic acid dehydrogenase [LDH]: Secondary | ICD-10-CM

## 2016-10-27 DIAGNOSIS — I70263 Atherosclerosis of native arteries of extremities with gangrene, bilateral legs: Secondary | ICD-10-CM

## 2016-10-27 DIAGNOSIS — Y838 Other surgical procedures as the cause of abnormal reaction of the patient, or of later complication, without mention of misadventure at the time of the procedure: Secondary | ICD-10-CM

## 2016-10-27 DIAGNOSIS — Q2543 Congenital aneurysm of aorta: Secondary | ICD-10-CM

## 2016-10-27 DIAGNOSIS — D62 Acute posthemorrhagic anemia: Secondary | ICD-10-CM

## 2016-10-27 DIAGNOSIS — Z882 Allergy status to sulfonamides status: Secondary | ICD-10-CM

## 2016-10-27 LAB — CBC
HCT: 25.4 % — ABNORMAL LOW (ref 39.0–52.0)
HEMATOCRIT: 26.7 % — AB (ref 39.0–52.0)
HEMOGLOBIN: 8.7 g/dL — AB (ref 13.0–17.0)
Hemoglobin: 8.3 g/dL — ABNORMAL LOW (ref 13.0–17.0)
MCH: 29.1 pg (ref 26.0–34.0)
MCH: 29.3 pg (ref 26.0–34.0)
MCHC: 32.7 g/dL (ref 30.0–36.0)
MCHC: 32.6 g/dL (ref 30.0–36.0)
MCV: 89.1 fL (ref 78.0–100.0)
MCV: 89.9 fL (ref 78.0–100.0)
Platelets: 128 10*3/uL — ABNORMAL LOW (ref 150–400)
Platelets: 172 10*3/uL (ref 150–400)
RBC: 2.85 MIL/uL — ABNORMAL LOW (ref 4.22–5.81)
RBC: 2.97 MIL/uL — ABNORMAL LOW (ref 4.22–5.81)
RDW: 15.2 % (ref 11.5–15.5)
RDW: 15.4 % (ref 11.5–15.5)
WBC: 8.6 10*3/uL (ref 4.0–10.5)
WBC: 11.6 10*3/uL — ABNORMAL HIGH (ref 4.0–10.5)

## 2016-10-27 LAB — PREPARE FRESH FROZEN PLASMA
UNIT DIVISION: 0
Unit division: 0

## 2016-10-27 LAB — GLUCOSE, CAPILLARY
GLUCOSE-CAPILLARY: 108 mg/dL — AB (ref 65–99)
GLUCOSE-CAPILLARY: 109 mg/dL — AB (ref 65–99)
GLUCOSE-CAPILLARY: 110 mg/dL — AB (ref 65–99)
GLUCOSE-CAPILLARY: 114 mg/dL — AB (ref 65–99)
GLUCOSE-CAPILLARY: 123 mg/dL — AB (ref 65–99)
GLUCOSE-CAPILLARY: 124 mg/dL — AB (ref 65–99)
GLUCOSE-CAPILLARY: 137 mg/dL — AB (ref 65–99)
GLUCOSE-CAPILLARY: 138 mg/dL — AB (ref 65–99)
Glucose-Capillary: 106 mg/dL — ABNORMAL HIGH (ref 65–99)
Glucose-Capillary: 130 mg/dL — ABNORMAL HIGH (ref 65–99)
Glucose-Capillary: 16 mg/dL — CL (ref 65–99)
Glucose-Capillary: 96 mg/dL (ref 65–99)

## 2016-10-27 LAB — POCT I-STAT 3, ART BLOOD GAS (G3+)
Acid-Base Excess: 3 mmol/L — ABNORMAL HIGH (ref 0.0–2.0)
Acid-base deficit: 1 mmol/L (ref 0.0–2.0)
Bicarbonate: 27.1 mmol/L (ref 20.0–28.0)
Bicarbonate: 23.5 mmol/L (ref 20.0–28.0)
O2 SAT: 93 %
O2 SAT: 89 %
PCO2 ART: 41.9 mmHg (ref 32.0–48.0)
PCO2 ART: 37.8 mmHg (ref 32.0–48.0)
PH ART: 7.424 (ref 7.350–7.450)
PH ART: 7.406 (ref 7.350–7.450)
TCO2: 28 mmol/L (ref 0–100)
TCO2: 25 mmol/L (ref 0–100)
pO2, Arterial: 71 mmHg — ABNORMAL LOW (ref 83.0–108.0)
pO2, Arterial: 60 mmHg — ABNORMAL LOW (ref 83.0–108.0)

## 2016-10-27 LAB — BASIC METABOLIC PANEL
ANION GAP: 10 (ref 5–15)
Anion gap: 14 (ref 5–15)
BUN: 38 mg/dL — ABNORMAL HIGH (ref 6–20)
BUN: 52 mg/dL — ABNORMAL HIGH (ref 6–20)
CALCIUM: 7.5 mg/dL — AB (ref 8.9–10.3)
CHLORIDE: 111 mmol/L (ref 101–111)
CO2: 26 mmol/L (ref 22–32)
CO2: 21 mmol/L — ABNORMAL LOW (ref 22–32)
Calcium: 7.2 mg/dL — ABNORMAL LOW (ref 8.9–10.3)
Chloride: 106 mmol/L (ref 101–111)
Creatinine, Ser: 1.22 mg/dL (ref 0.61–1.24)
Creatinine, Ser: 1.58 mg/dL — ABNORMAL HIGH (ref 0.61–1.24)
GFR, EST AFRICAN AMERICAN: 49 mL/min — AB (ref >60)
GFR, EST NON AFRICAN AMERICAN: 58 mL/min — AB (ref >60)
GFR, EST NON AFRICAN AMERICAN: 43 mL/min — AB (ref >60)
Glucose, Bld: 119 mg/dL — ABNORMAL HIGH (ref 65–99)
Glucose, Bld: 133 mg/dL — ABNORMAL HIGH (ref 65–99)
POTASSIUM: 3.9 mmol/L (ref 3.5–5.1)
Potassium: 3.9 mmol/L (ref 3.5–5.1)
SODIUM: 142 mmol/L (ref 135–145)
SODIUM: 146 mmol/L — AB (ref 135–145)

## 2016-10-27 LAB — POCT I-STAT, CHEM 8
BUN: 49 mg/dL — ABNORMAL HIGH (ref 6–20)
CREATININE: 1.7 mg/dL — AB (ref 0.61–1.24)
Calcium, Ion: 1 mmol/L — ABNORMAL LOW (ref 1.15–1.40)
Chloride: 108 mmol/L (ref 101–111)
Glucose, Bld: 124 mg/dL — ABNORMAL HIGH (ref 65–99)
HEMATOCRIT: 22 % — AB (ref 39.0–52.0)
HEMOGLOBIN: 7.5 g/dL — AB (ref 13.0–17.0)
POTASSIUM: 4.1 mmol/L (ref 3.5–5.1)
SODIUM: 145 mmol/L (ref 135–145)
TCO2: 24 mmol/L (ref 0–100)

## 2016-10-27 LAB — CREATININE, SERUM
Creatinine, Ser: 1.61 mg/dL — ABNORMAL HIGH (ref 0.61–1.24)
GFR calc Af Amer: 48 mL/min — ABNORMAL LOW (ref >60)
GFR, EST NON AFRICAN AMERICAN: 42 mL/min — AB (ref >60)

## 2016-10-27 LAB — MAGNESIUM
MAGNESIUM: 2.5 mg/dL — AB (ref 1.7–2.4)
MAGNESIUM: 2.6 mg/dL — AB (ref 1.7–2.4)

## 2016-10-27 LAB — HEPARIN INDUCED PLATELET AB (HIT ANTIBODY): HEPARIN INDUCED PLT AB: 0.157 {OD_unit} (ref 0.000–0.400)

## 2016-10-27 LAB — COOXEMETRY PANEL
CARBOXYHEMOGLOBIN: 1.3 % (ref 0.5–1.5)
Methemoglobin: 1 % (ref 0.0–1.5)
O2 Saturation: 59 %
Total hemoglobin: 10.1 g/dL — ABNORMAL LOW (ref 12.0–16.0)

## 2016-10-27 LAB — HEPATIC FUNCTION PANEL
ALBUMIN: 2.1 g/dL — AB (ref 3.5–5.0)
ALK PHOS: 155 U/L — AB (ref 38–126)
ALT: 451 U/L — AB (ref 17–63)
AST: 319 U/L — AB (ref 15–41)
BILIRUBIN DIRECT: 0.7 mg/dL — AB (ref 0.1–0.5)
BILIRUBIN TOTAL: 1.6 mg/dL — AB (ref 0.3–1.2)
Indirect Bilirubin: 0.9 mg/dL (ref 0.3–0.9)
Total Protein: 4.8 g/dL — ABNORMAL LOW (ref 6.5–8.1)

## 2016-10-27 LAB — APTT: aPTT: 86 seconds — ABNORMAL HIGH (ref 24–36)

## 2016-10-27 LAB — PROCALCITONIN: PROCALCITONIN: 4.56 ng/mL

## 2016-10-27 MED ORDER — CHLORHEXIDINE GLUCONATE 0.12 % MT SOLN
OROMUCOSAL | Status: AC
  Administered 2016-10-27: 09:00:00
  Filled 2016-10-27: qty 15

## 2016-10-27 MED ORDER — VANCOMYCIN HCL 10 G IV SOLR
2000.0000 mg | Freq: Once | INTRAVENOUS | Status: AC
  Administered 2016-10-27: 2000 mg via INTRAVENOUS
  Filled 2016-10-27: qty 2000

## 2016-10-27 MED ORDER — SODIUM CHLORIDE 0.9 % IV SOLN
300.0000 mg | Freq: Three times a day (TID) | INTRAVENOUS | Status: DC
  Filled 2016-10-27 (×3): qty 300

## 2016-10-27 MED ORDER — INSULIN ASPART 100 UNIT/ML ~~LOC~~ SOLN
0.0000 [IU] | SUBCUTANEOUS | Status: DC
  Administered 2016-10-27: 2 [IU] via SUBCUTANEOUS
  Administered 2016-10-28 (×2): 4 [IU] via SUBCUTANEOUS
  Administered 2016-10-28 (×4): 2 [IU] via SUBCUTANEOUS
  Administered 2016-10-28: 4 [IU] via SUBCUTANEOUS
  Administered 2016-10-29: 2 [IU] via SUBCUTANEOUS
  Administered 2016-10-29: 8 [IU] via SUBCUTANEOUS
  Administered 2016-10-29: 4 [IU] via SUBCUTANEOUS
  Administered 2016-10-29: 8 [IU] via SUBCUTANEOUS
  Administered 2016-10-29 – 2016-10-30 (×2): 2 [IU] via SUBCUTANEOUS
  Administered 2016-10-30 (×4): 4 [IU] via SUBCUTANEOUS
  Administered 2016-10-30: 12 [IU] via SUBCUTANEOUS
  Administered 2016-10-31: 4 [IU] via SUBCUTANEOUS
  Administered 2016-10-31 (×2): 2 [IU] via SUBCUTANEOUS
  Administered 2016-10-31: 4 [IU] via SUBCUTANEOUS
  Administered 2016-10-31 (×2): 12 [IU] via SUBCUTANEOUS
  Administered 2016-11-01: 2 [IU] via SUBCUTANEOUS
  Administered 2016-11-01 (×3): 4 [IU] via SUBCUTANEOUS
  Administered 2016-11-01 (×2): 8 [IU] via SUBCUTANEOUS
  Administered 2016-11-02 (×3): 4 [IU] via SUBCUTANEOUS
  Administered 2016-11-02: 2 [IU] via SUBCUTANEOUS
  Administered 2016-11-02: 4 [IU] via SUBCUTANEOUS
  Administered 2016-11-02: 8 [IU] via SUBCUTANEOUS
  Administered 2016-11-03 (×2): 4 [IU] via SUBCUTANEOUS
  Administered 2016-11-03: 2 [IU] via SUBCUTANEOUS
  Administered 2016-11-03 – 2016-11-04 (×3): 4 [IU] via SUBCUTANEOUS
  Administered 2016-11-04: 2 [IU] via SUBCUTANEOUS
  Administered 2016-11-04: 4 [IU] via SUBCUTANEOUS
  Administered 2016-11-04: 2 [IU] via SUBCUTANEOUS
  Administered 2016-11-04: 12 [IU] via SUBCUTANEOUS
  Administered 2016-11-05: 4 [IU] via SUBCUTANEOUS
  Administered 2016-11-05 (×4): 2 [IU] via SUBCUTANEOUS
  Administered 2016-11-05: 4 [IU] via SUBCUTANEOUS
  Administered 2016-11-05 – 2016-11-07 (×6): 2 [IU] via SUBCUTANEOUS
  Administered 2016-11-08 (×2): 4 [IU] via SUBCUTANEOUS
  Administered 2016-11-08: 2 [IU] via SUBCUTANEOUS
  Administered 2016-11-08: 4 [IU] via SUBCUTANEOUS
  Administered 2016-11-08 – 2016-11-09 (×2): 2 [IU] via SUBCUTANEOUS
  Administered 2016-11-09 – 2016-11-10 (×2): 4 [IU] via SUBCUTANEOUS
  Administered 2016-11-10 (×2): 2 [IU] via SUBCUTANEOUS
  Administered 2016-11-10 (×2): 4 [IU] via SUBCUTANEOUS
  Administered 2016-11-11: 8 [IU] via SUBCUTANEOUS
  Administered 2016-11-11: 4 [IU] via SUBCUTANEOUS
  Administered 2016-11-11: 2 [IU] via SUBCUTANEOUS
  Administered 2016-11-11: 4 [IU] via SUBCUTANEOUS
  Administered 2016-11-11: 8 [IU] via SUBCUTANEOUS
  Administered 2016-11-11: 4 [IU] via SUBCUTANEOUS
  Administered 2016-11-12 (×3): 2 [IU] via SUBCUTANEOUS
  Administered 2016-11-12: 4 [IU] via SUBCUTANEOUS
  Administered 2016-11-12: 2 [IU] via SUBCUTANEOUS
  Administered 2016-11-13: 20 [IU] via SUBCUTANEOUS
  Administered 2016-11-13: 2 [IU] via SUBCUTANEOUS
  Administered 2016-11-13: 8 [IU] via SUBCUTANEOUS

## 2016-10-27 MED ORDER — FAMOTIDINE IN NACL 20-0.9 MG/50ML-% IV SOLN
20.0000 mg | Freq: Two times a day (BID) | INTRAVENOUS | Status: DC
  Administered 2016-10-27 – 2016-11-01 (×12): 20 mg via INTRAVENOUS
  Filled 2016-10-27 (×12): qty 50

## 2016-10-27 MED ORDER — VANCOMYCIN HCL IN DEXTROSE 750-5 MG/150ML-% IV SOLN
750.0000 mg | Freq: Two times a day (BID) | INTRAVENOUS | Status: DC
  Administered 2016-10-28: 750 mg via INTRAVENOUS
  Filled 2016-10-27 (×2): qty 150

## 2016-10-27 MED ORDER — DEXTROSE 5 % IV SOLN
2.0000 g | INTRAVENOUS | Status: DC
  Administered 2016-10-27 – 2016-10-28 (×2): 2 g via INTRAVENOUS
  Filled 2016-10-27 (×3): qty 2

## 2016-10-27 NOTE — Care Management Important Message (Signed)
Important Message  Patient Details  Name: Scott [name] MRN: [id number] Date of Birth: 1946/08/22   Medicare Important Message Given:  Yes    Nathen May 10/27/2016, 9:37 AM

## 2016-10-27 NOTE — Progress Notes (Signed)
[place] for Bivalirudin Indication: R/O clot in R-atrium and R/O HIT  Allergies  Allergen Reactions  . Doxycycline Hives  . Penicillins Hives     Has patient had a PCN reaction causing immediate rash, facial/tongue/throat swelling, SOB or lightheadedness with hypotension: No Has patient had a PCN reaction causing severe rash involving mucus membranes or skin necrosis: No Has patient had a PCN reaction that required hospitalization: No Has patient had a PCN reaction occurring within the last 10 years: Yes If all of the above answers are "NO", then may proceed with Cephalosporin use.   . Sulfa Antibiotics Hives    Patient Measurements: Height: 5\' 9"  (175.3 cm) Weight: 249 lb 9 oz (113.2 kg) IBW/kg (Calculated) : 70.7  Vital Signs: Temp: 101.3 F (38.5 C) (12/05 1100) Temp Source: Axillary (12/05 1100) BP: 133/66 (12/05 1100) Pulse Rate: 81 (12/05 1100)  Labs:  Recent Labs  10/25/16 1600 10/26/16 0400  10/26/16 1124 10/26/16 1635 10/26/16 1636 10/26/16 1837 10/26/16 2100 10/27/16 0500  HGB 8.6* 9.1*  < > 9.1* 8.7* 8.2*  --   --  8.3*  HCT 25.7* 27.0*  < > 27.4* 26.2* 24.0*  --   --  25.4*  PLT 54* 84*  --  98* 118*  --   --   --  128*  APTT  --  41*  --  42*  --   --  77* 82* 86*  LABPROT 21.9* 22.6*  --  22.2*  --   --   --   --   --   INR 1.88 1.96  --  1.91  --   --   --   --   --   CREATININE  --  0.86  --   --  1.09 1.00  --   --  1.22  < > = values in this interval not displayed.  Estimated Creatinine Clearance: 69.9 mL/min (by C-G formula based on SCr of 1.22 mg/dL).    Assessment: 70yo male s/p sternal washout POD#1 with possible R-atrial clot HIT, on bivalirudin. -aPTT= 86, slightly above goal on 0.09 mg/kg/hr, Hg 8.3, pltc improving at 128. No active bleeding reported. -HIT antibody pending  Goal of Therapy:  APTT 50-85sec Monitor platelets by anticoagulation protocol: Yes    Plan -Decrease bivalirudin  slightly to 0.08 mg/kg/hr to avoid going over the goal -aPTT daily  Eudelia Bunch, Pharm.D. BP:[id number] 10/27/2016 12:31 PM

## 2016-10-27 NOTE — Progress Notes (Signed)
[address]       [place] [address]             [phone]        CARDIOTHORACIC SURGERY PROGRESS NOTE   R1 Day Post-Op Procedure(s) (LRB): STERNAL WASHOUT AND DELAYED PRIMARY CLOSURE (N/A) TRANSESOPHAGEAL ECHOCARDIOGRAM (TEE) (N/A)  Subjective: Heavily sedated on vent  Objective: Vital signs: BP Readings from Last 1 Encounters:  10/27/16 104/63   Pulse Readings from Last 1 Encounters:  10/27/16 80   Resp Readings from Last 1 Encounters:  10/27/16 16   Temp Readings from Last 1 Encounters:  10/27/16 (!) 100.5 F (38.1 C) (Oral)    Hemodynamics: PAP: (41-54)/(17-29) 53/21 CVP:  [14 mmHg-20 mmHg] 17 mmHg CO:  [4.3 L/min] 4.3 L/min CI:  [2 L/min/m2] 2 L/min/m2  Mixed venous co-ox 59%  Physical Exam:  Rhythm:   sinus  Breath sounds: Coarse but symmetrical, minimal airway secretions  Heart sounds:  RRR  Incisions:  Dressing dry, intact  Abdomen:  Soft, non-distended, quiet  Extremities:  Warm, adequately-perfused, dusky fingers both hands and sole of right foot  Chest tubes:  low volume thin serosanguinous output, no air leak    Intake/Output from previous day: 12/04 0701 - 12/05 0700 In: 5097.5 [I.V.:4246.5; Blood:551; IV [id number] Out: 2720 [Urine:1720; Blood:50; Chest Tube:950] Intake/Output this shift: No intake/output data recorded.  Lab Results:  CBC: Recent Labs  10/26/16 1635 10/26/16 1636 10/27/16 0500  WBC 8.5  --  8.6  HGB 8.7* 8.2* 8.3*  HCT 26.2* 24.0* 25.4*  PLT 118*  --  128*    BMET:  Recent Labs  10/26/16 1635 10/26/16 1636 10/27/16 0500  NA 144 144 142  K 3.9 3.8 3.9  CL 109 105 106  CO2 25  --  26  GLUCOSE 109* 105* 119*  BUN 30* 32* 38*  CREATININE 1.09 1.00 1.22  CALCIUM 7.6*  --  7.5*     PT/INR:   Recent Labs  10/26/16 1124  LABPROT 22.2*  INR 1.91    CBG (last 3)   Recent Labs  10/27/16 0404 10/27/16 0508 10/27/16 0653  GLUCAP 110* 109* 130*    ABG    Component  Value Date/Time   PHART 7.387 10/26/2016 1121   PCO2ART 44.7 10/26/2016 1121   PO2ART 80.0 (L) 10/26/2016 1121   HCO3 26.5 10/26/2016 1121   TCO2 25 10/26/2016 1636   ACIDBASEDEF TEST WILL BE CREDITED 10/26/2016 0420   O2SAT 59.0 10/27/2016 0510    CXR: Stable, mild diffuse airspace opacity c/w pulmonary edema  Assessment/Plan: S/P Procedure(s) (LRB): STERNAL WASHOUT AND DELAYED PRIMARY CLOSURE (N/A) TRANSESOPHAGEAL ECHOCARDIOGRAM (TEE) (N/A)  Clinically stable since return to OR for sternal washout and delayed primary closure Maintaining NSR - AAI paced rhythm w/ stable hemodynamics on milrinone 0.3 and low dose levophed, co-ox 59% and CVP 16, nitric oxide barely on at 2ppm O2 sats 100% on 40% FiO2 and PEEP=5, CXR looks reasonably good Platelet count increasing, peripheral stigmata c/w possible HITT, now on bivalirudin drip Multiple large mobile masses seen in RA on TEE most c/w clot - vegetations much less likely Low grade fevers w/out leukocytosis, likely reactive - all old central lines have been removed and PICC line placed 12/4 Acute combined systolic and diastolic CHF with expected post-op volume excess, I/O's positive yesterday  Post op atrial fibrillation on IV amiodarone Expected post op acute blood loss anemia, Hgb down to 8.3 Elevated transaminases and prothrombin time likely  due to shock liver - resolving   Continue bivalirudin for presumed HITT - await serologic confirmation  Consult Infectious Disease team for recommendations to possibly cover patient for endocarditis, although this seems much less likely  Consider acute vent wean and possible extubation once off nitric oxide - hold fentanyl drip for now  Restart tube feeds later today or tomorrow if unable to wean vent soon  Watch anemia for now  Continue lasix drip  Continue milrinone for now  Wean levophed as tolerated   Rexene Alberts, MD 10/27/2016 7:45 AM

## 2016-10-27 NOTE — Consult Note (Signed)
  [facility]  Date of Admission:  10/20/2016  Date of Consult:  10/27/2016  Reason for Consult: fever, ?endocarditis Referring Physician: Dr. Owen  Impression/Recommendation Post cardiotomy shock - remains on pressors  Fever post cardiac surgery  Questionable vegetation vs clot on RA  Thrombocytopenia most likely post op, less likely HIT, HIT panel pending. On angiomax.   Anemia post op, stable.   Elevated LFT likely 2/2 to post cardiotomy shock, improving  VDRF  Cultures NGTD. PCT 4.56. DVT study neg for DVT but shows small segment non occlusive superficial vein thrombosis in Right prox GSV above harvest site.   Fever is multifactorial, endocarditis, could be post op due to cytokine release and stress, could be 2/2 to atelectasis, could be due to peripheral ischemia. Will treat for possible prosthetic valve related endocarditis for now with vanc + ceftriaxone, hold rifampin due to drug interactions and his elevated LFTs. Reconsider as his LFTs improve. Will ask pharmacy to assist us.  HIs Cx will be the most important determinant in long term anbx for him.   Thank you so much for this interesting consult,   [name], [name] (pager) [phone] [email or web address]  Scott [name] is an 70 y.o. male.  HPI: with extensive PMH including DM II, HTN, HLD, who presented on 11/28 for on going chest pain for 2 months that was progressively getting worse. Had LHC on 11/28, he was found to have multivessel CAD, severe AS, severe MR, and aortic root aneurysm. Went to OR with Dr. Owen CT surgery on 11/30 and had open AV replacement, MV repair, ascending throaic aortic aneurysm repaid, and CABGx3. thoraic cavity was left open and had extended pump time in OR. Had clot formation in the oxygenation during the surgery.   Has remained on vent since the surgery. Had post cardiotomy shock requiring pressors with resolved. Had post op Afib. Had low grade  fevers. Went back to OR  Yesterday for sternal washout and delayed primary closure. Intraoperative TEE showed several large mobile echogenic densities within the right atrium suspicious for blood clot vs vegetations.  His highest WBC was 13.2 on 11/30, has been normal since then.    His plt count has been low, as low as 50 on 12/3 post operatively, and today 128. Heparin as held and HIT panel was ordered 12/4, patient was started on argatroban for presumed HIT.   Patient has low grade fevers since 11/30, tmax 102 on 12/02.  bcx from 12/1 and 12/4 NGTD.   Past Medical History:  Diagnosis Date  . Arthritis    "hips, shoulders; knees; back" (10/20/2016)  . Bicuspid aortic valve   . BPH (benign prostatic hypertrophy)   . Carpal tunnel syndrome of right wrist   . Coronary artery disease involving native coronary artery of native heart with unstable angina pectoris (HCC) 10/20/2016  . Diverticulitis   . GERD (gastroesophageal reflux disease)   . Gout   . Heart murmur   . Hyperlipemia   . Hypertension   . Postoperative atrial fibrillation (HCC) 10/24/2016  . RLS (restless legs syndrome)   . S/P aortic valve replacement with bioprosthetic valve 10/22/2016   25 mm Edwards Magna Ease bovine pericardial bioprosthetic tissue valve  . S/P ascending aortic aneurysm repair 10/22/2016   28 mm supracoronary straight graft replacement of ascending thoracic aortic aneurysm  . S/P CABG x 3 10/22/2016   Sequential LIMA to Diag and LAD, SVG to distal LAD, open vein harvest right thigh  .   S/P mitral valve repair 10/22/2016   Artificial Gore-tex neochord placement x6 - posterior annuloplasty band placed but removed due to systolic anterior motion of mitral valve  . Severe aortic stenosis   . Snores    Never been tested for sleep apnea  . Thoracic ascending aortic aneurysm (HCC) 10/21/2016  . Thrombocytopenia (HCC) 10/22/2016  . Type II diabetes mellitus (HCC)     Past Surgical History:  Procedure  Laterality Date  . CARDIAC CATHETERIZATION N/A 10/20/2016   Procedure: Right/Left Heart Cath and Coronary Angiography;  Surgeon: David W Harding, MD;  Location: [facility];  Service: Cardiovascular;  Laterality: N/A;  . CARPAL TUNNEL RELEASE Right 11/28/2013   Procedure: RIGHT WRIST CARPAL TUNNEL RELEASE;  Surgeon: Robert A Wainer, MD;  Location: [facility];  Service: Orthopedics;  Laterality: Right;  . CATARACT EXTRACTION W/ INTRAOCULAR LENS  IMPLANT, BILATERAL Bilateral 1978  . COLONOSCOPY    . FRACTURE SURGERY    . INGUINAL HERNIA REPAIR Right 1998  . LIPOMA EXCISION Right 2008   "side of my head"  . PENECTOMY  2007   Peyronie's disease   . PENILE PROSTHESIS IMPLANT  2009  . SHOULDER OPEN ROTATOR CUFF REPAIR Right 2006  . TONSILLECTOMY  ~ 1955  . TRANSURETHRAL RESECTION OF PROSTATE  2005  . TRIGGER FINGER RELEASE Bilateral    several lt and rt hands  . TRIGGER FINGER RELEASE Right 11/28/2013   Procedure: RIGHT TRIGGER FINGER  RELEASE (TENDON SHEATH INCISION);  Surgeon: Robert A Wainer, MD;  Location: [facility];  Service: Orthopedics;  Laterality: Right;  . WRIST FRACTURE SURGERY Right ~ 1959     Allergies  Allergen Reactions  . Doxycycline Hives  . Penicillins Hives     Has patient had a PCN reaction causing immediate rash, facial/tongue/throat swelling, SOB or lightheadedness with hypotension: No Has patient had a PCN reaction causing severe rash involving mucus membranes or skin necrosis: No Has patient had a PCN reaction that required hospitalization: No Has patient had a PCN reaction occurring within the last 10 years: Yes If all of the above answers are "NO", then may proceed with Cephalosporin use.   . Sulfa Antibiotics Hives    Medications: I have reviewed the patient's current medications.  Abtx:  Anti-infectives    Start     Dose/Rate Route Frequency Ordered Stop   10/27/16 1000  levofloxacin (LEVAQUIN) IVPB 750 mg     750  mg 100 mL/hr over 90 Minutes Intravenous Every 24 hours 10/26/16 1054 10/28/16 0959   10/26/16 1815  vancomycin (VANCOCIN) IVPB 1000 mg/200 mL premix     1,000 mg 200 mL/hr over 60 Minutes Intravenous  Once 10/26/16 1054 10/26/16 1752   10/26/16 0954  polymyxin B 500,000 Units, bacitracin 50,000 Units in sodium chloride irrigation 0.9 % 500 mL irrigation  Status:  Discontinued       As needed 10/26/16 0954 10/26/16 1030   10/26/16 0400  vancomycin (VANCOCIN) 1,500 mg in sodium chloride 0.9 % 250 mL IVPB     1,500 mg 125 mL/hr over 120 Minutes Intravenous To Surgery 10/25/16 1145 10/26/16 0925   10/26/16 0400  vancomycin (VANCOCIN) 1,000 mg in sodium chloride 0.9 % 1,000 mL irrigation      Irrigation To Surgery 10/25/16 1145 10/26/16 0953   10/26/16 0400  levofloxacin (LEVAQUIN) IVPB 500 mg     500 mg 100 mL/hr over 60 Minutes Intravenous To Surgery 10/25/16 1145 10/26/16 0925   10/23/16 1000    levofloxacin (LEVAQUIN) IVPB 750 mg     750 mg 100 mL/hr over 90 Minutes Intravenous Every 24 hours 10/22/16 2130 10/23/16 1047   10/23/16 0515  vancomycin (VANCOCIN) IVPB 1000 mg/200 mL premix     1,000 mg 200 mL/hr over 60 Minutes Intravenous  Once 10/22/16 2130 10/23/16 0600   10/22/16 2045  vancomycin (VANCOCIN) 1,250 mg in sodium chloride 0.9 % 250 mL IVPB  Status:  Discontinued     1,250 mg 166.7 mL/hr over 90 Minutes Intravenous To Surgery 10/22/16 2043 10/22/16 2130   10/22/16 2045  levofloxacin (LEVAQUIN) IVPB 500 mg  Status:  Discontinued     500 mg 100 mL/hr over 60 Minutes Intravenous To Surgery 10/22/16 2043 10/22/16 2130   10/22/16 0400  vancomycin (VANCOCIN) 1,250 mg in sodium chloride 0.9 % 250 mL IVPB     1,250 mg 166.7 mL/hr over 90 Minutes Intravenous To Surgery 10/21/16 1305 10/22/16 2247   10/22/16 0400  vancomycin (VANCOCIN) 1,000 mg in sodium chloride 0.9 % 1,000 mL irrigation      Irrigation To Surgery 10/21/16 1305 10/22/16 0819   10/22/16 0400  levofloxacin (LEVAQUIN)  IVPB 500 mg     500 mg 100 mL/hr over 60 Minutes Intravenous To Surgery 10/21/16 1305 10/22/16 2114      Total days of antibiotics: Levaquin 2 days total.          Social History:  reports that he quit smoking about 33 years ago. His smoking use included Pipe and Cigars. He quit after 3.00 years of use. He has never used smokeless tobacco. He reports that he drinks about 6.0 oz of alcohol per week . He reports that he does not use drugs.  Family History  Problem Relation Age of Onset  . Lung cancer Mother   . Clotting disorder Father     No details  . Heart disease Sister 24    Stents  . Cancer Sister     Throat    ROS unable to obtain due to patient's mental status.   Blood pressure (!) 112/57, pulse 79, temperature (!) 100.7 F (38.2 C), temperature source Oral, resp. rate 20, height 5' 9" (1.753 m), weight 249 lb 9 oz (113.2 kg), SpO2 95 %.  Physical Exam  Constitutional: He appears well-developed and well-nourished.  Intubated, sedated.   HENT:  Head: Normocephalic and atraumatic.  Eyes:  Unable to assess EOM.   Cardiovascular: Normal rate and regular rhythm.   RRR, murmurs present.   Respiratory:  Course breathe sounds. Intubated.  Sternotomy site covered with dressing. Has chest tubes.   GI:  mildy distended.   Musculoskeletal:  Cyanotic fingers on both hands. All exts are cold to touch.  Neurological:  Sedated. Does not follow any commands. Does not open eyes or move any exts.       Results for orders placed or performed during the hospital encounter of 10/20/16 (from the past 48 hour(s))  Glucose, capillary     Status: Abnormal   Collection Time: 10/25/16 11:08 AM  Result Value Ref Range   Glucose-Capillary 192 (H) 65 - 99 mg/dL  Glucose, capillary     Status: Abnormal   Collection Time: 10/25/16 12:38 PM  Result Value Ref Range   Glucose-Capillary 124 (H) 65 - 99 mg/dL  Glucose, capillary     Status: Abnormal   Collection Time: 10/25/16  1:03 PM   Result Value Ref Range   Glucose-Capillary 103 (H) 65 - 99 mg/dL  Glucose, capillary  Status: Abnormal   Collection Time: 10/25/16  2:17 PM  Result Value Ref Range   Glucose-Capillary 147 (H) 65 - 99 mg/dL  Glucose, capillary     Status: Abnormal   Collection Time: 10/25/16  3:10 PM  Result Value Ref Range   Glucose-Capillary 132 (H) 65 - 99 mg/dL  Glucose, capillary     Status: Abnormal   Collection Time: 10/25/16  3:59 PM  Result Value Ref Range   Glucose-Capillary 116 (H) 65 - 99 mg/dL  Protime-INR     Status: Abnormal   Collection Time: 10/25/16  4:00 PM  Result Value Ref Range   Prothrombin Time 21.9 (H) 11.4 - 15.2 seconds   INR 1.88   CBC     Status: Abnormal   Collection Time: 10/25/16  4:00 PM  Result Value Ref Range   WBC 4.5 4.0 - 10.5 K/uL   RBC 2.95 (L) 4.22 - 5.81 MIL/uL   Hemoglobin 8.6 (L) 13.0 - 17.0 g/dL   HCT 25.7 (L) 39.0 - 52.0 %   MCV 87.1 78.0 - 100.0 fL   MCH 29.2 26.0 - 34.0 pg   MCHC 33.5 30.0 - 36.0 g/dL   RDW 14.9 11.5 - 15.5 %   Platelets 54 (L) 150 - 400 K/uL    Comment: CONSISTENT WITH PREVIOUS RESULT  Type and screen [facility]     Status: None   Collection Time: 10/25/16  5:00 PM  Result Value Ref Range   ABO/RH(D) O POS    Antibody Screen NEG    Sample Expiration 10/28/2016   Glucose, capillary     Status: Abnormal   Collection Time: 10/25/16  5:13 PM  Result Value Ref Range   Glucose-Capillary 165 (H) 65 - 99 mg/dL  Glucose, capillary     Status: Abnormal   Collection Time: 10/25/16  6:43 PM  Result Value Ref Range   Glucose-Capillary 116 (H) 65 - 99 mg/dL   Comment 1 Arterial Specimen   Glucose, capillary     Status: Abnormal   Collection Time: 10/25/16  7:42 PM  Result Value Ref Range   Glucose-Capillary 109 (H) 65 - 99 mg/dL  Glucose, capillary     Status: Abnormal   Collection Time: 10/25/16  9:06 PM  Result Value Ref Range   Glucose-Capillary 124 (H) 65 - 99 mg/dL  Glucose, capillary     Status:  Abnormal   Collection Time: 10/25/16 10:00 PM  Result Value Ref Range   Glucose-Capillary 113 (H) 65 - 99 mg/dL   Comment 1 Arterial Specimen   Glucose, capillary     Status: Abnormal   Collection Time: 10/25/16 10:59 PM  Result Value Ref Range   Glucose-Capillary 142 (H) 65 - 99 mg/dL   Comment 1 Arterial Specimen   Glucose, capillary     Status: Abnormal   Collection Time: 10/25/16 11:47 PM  Result Value Ref Range   Glucose-Capillary 116 (H) 65 - 99 mg/dL  Glucose, capillary     Status: None   Collection Time: 10/26/16  1:10 AM  Result Value Ref Range   Glucose-Capillary 92 65 - 99 mg/dL  Glucose, capillary     Status: Abnormal   Collection Time: 10/26/16  2:07 AM  Result Value Ref Range   Glucose-Capillary 101 (H) 65 - 99 mg/dL   Comment 1 Arterial Specimen   Glucose, capillary     Status: None   Collection Time: 10/26/16  3:12 AM  Result Value Ref Range     Glucose-Capillary 80 65 - 99 mg/dL   Comment 1 Arterial Specimen   Basic metabolic panel     Status: Abnormal   Collection Time: 10/26/16  4:00 AM  Result Value Ref Range   Sodium 144 135 - 145 mmol/L   Potassium 3.2 (L) 3.5 - 5.1 mmol/L   Chloride 107 101 - 111 mmol/L   CO2 28 22 - 32 mmol/L   Glucose, Bld 88 65 - 99 mg/dL   BUN 27 (H) 6 - 20 mg/dL   Creatinine, Ser 0.86 0.61 - 1.24 mg/dL   Calcium 7.8 (L) 8.9 - 10.3 mg/dL   GFR calc non Af Amer >60 >60 mL/min   GFR calc Af Amer >60 >60 mL/min    Comment: (NOTE) The eGFR has been calculated using the CKD EPI equation. This calculation has not been validated in all clinical situations. eGFR's persistently <60 mL/min signify possible Chronic Kidney Disease.    Anion gap 9 5 - 15  CBC     Status: Abnormal   Collection Time: 10/26/16  4:00 AM  Result Value Ref Range   WBC 6.5 4.0 - 10.5 K/uL   RBC 3.07 (L) 4.22 - 5.81 MIL/uL   Hemoglobin 9.1 (L) 13.0 - 17.0 g/dL   HCT 27.0 (L) 39.0 - 52.0 %   MCV 87.9 78.0 - 100.0 fL   MCH 29.6 26.0 - 34.0 pg   MCHC 33.7  30.0 - 36.0 g/dL   RDW 15.1 11.5 - 15.5 %   Platelets 84 (L) 150 - 400 K/uL    Comment: CONSISTENT WITH PREVIOUS RESULT  Phosphorus     Status: Abnormal   Collection Time: 10/26/16  4:00 AM  Result Value Ref Range   Phosphorus 1.7 (L) 2.5 - 4.6 mg/dL  Magnesium     Status: None   Collection Time: 10/26/16  4:00 AM  Result Value Ref Range   Magnesium 1.8 1.7 - 2.4 mg/dL  Protime-INR     Status: Abnormal   Collection Time: 10/26/16  4:00 AM  Result Value Ref Range   Prothrombin Time 22.6 (H) 11.4 - 15.2 seconds   INR 1.96   APTT     Status: Abnormal   Collection Time: 10/26/16  4:00 AM  Result Value Ref Range   aPTT 41 (H) 24 - 36 seconds    Comment:        IF BASELINE aPTT IS ELEVATED, SUGGEST PATIENT RISK ASSESSMENT BE USED TO DETERMINE APPROPRIATE ANTICOAGULANT THERAPY.   Glucose, capillary     Status: None   Collection Time: 10/26/16  4:12 AM  Result Value Ref Range   Glucose-Capillary 89 65 - 99 mg/dL   Comment 1 Arterial Specimen   Carboxyhemoglobin (single result)     Status: None   Collection Time: 10/26/16  4:20 AM  Result Value Ref Range   Carboxyhemoglobin TEST WILL BE CREDITED 0.5 - 1.5 %    Comment: Performed at [place] 12/04 AT 1422: PREVIOUSLY REPORTED AS 1.5   Blood gas, arterial     Status: Abnormal   Collection Time: 10/26/16  4:20 AM  Result Value Ref Range   FIO2 TEST WILL BE CREDITED    O2 Content TEST WILL BE CREDITED L/min   Delivery systems TEST WILL BE CREDITED    Mode TEST WILL BE CREDITED    VT TEST WILL BE CREDITED mL   LHR TEST WILL BE CREDITED resp/min   Hi Frequency JET Vent Rate TEST WILL BE  CREDITED    Peep/cpap TEST WILL BE CREDITED cm H20   PIP TEST WILL BE CREDITED cm H2O   Hi Frequency JET Vent PIP TEST WILL BE CREDITED    Inspiratory PAP TEST WILL BE CREDITED    Expiratory PAP TEST WILL BE CREDITED    Pressure support TEST WILL BE CREDITED cm H20   Pressure control TEST WILL BE CREDITED cm  H20   pH, Arterial TEST WILL BE CREDITED 7.350 - 7.450   pCO2 arterial TEST WILL BE CREDITED 32.0 - 48.0 mmHg   pO2, Arterial TEST WILL BE CREDITED 83.0 - 108.0 mmHg   Bicarbonate TEST WILL BE CREDITED 20.0 - 28.0 mmol/L   Acid-Base Excess TEST WILL BE CREDITED 0.0 - 2.0 mmol/L   Acid-base deficit TEST WILL BE CREDITED 0.0 - 2.0 mmol/L   O2 Saturation TEST WILL BE CREDITED %    Comment: CORRECTED ON 12/04 AT 1422: PREVIOUSLY REPORTED AS 62.0   Patient temperature TEST WILL BE CREDITED    Amplitude TEST WILL BE CREDITED    Map TEST WILL BE CREDITED cmH20   Hertz TEST WILL BE CREDITED    Oxygen index TEST WILL BE CREDITED    Nitric Oxide TEST WILL BE CREDITED    Collection site TEST WILL BE CREDITED    Drawn by TEST WILL BE CREDITED    Sample type TEST WILL BE CREDITED    Allens test (pass/fail) TEST WILL BE CREDITED (A) PASS   Mechanical Rate TEST WILL BE CREDITED     Comment: Performed at  [facility]  .Cooxemetry Panel (carboxy, met, total hgb, O2 sat)     Status: Abnormal   Collection Time: 10/26/16  4:20 AM  Result Value Ref Range   Total hemoglobin 9.2 (L) 12.0 - 16.0 g/dL   O2 Saturation 62.0 %   Carboxyhemoglobin 1.5 0.5 - 1.5 %   Methemoglobin 1.0 0.0 - 1.5 %  Glucose, capillary     Status: None   Collection Time: 10/26/16  4:56 AM  Result Value Ref Range   Glucose-Capillary 90 65 - 99 mg/dL   Comment 1 Arterial Specimen   Blood gas, arterial     Status: Abnormal   Collection Time: 10/26/16  5:10 AM  Result Value Ref Range   FIO2 40.00    Delivery systems VENTILATOR    Mode PRESSURE REGULATED VOLUME CONTROL    VT 570 mL   LHR 16 resp/min   Peep/cpap 5.0 cm H20   pH, Arterial 7.462 (H) 7.350 - 7.450   pCO2 arterial 41.9 32.0 - 48.0 mmHg   pO2, Arterial 103 83.0 - 108.0 mmHg   Bicarbonate 29.5 (H) 20.0 - 28.0 mmol/L   Acid-Base Excess 5.7 (H) 0.0 - 2.0 mmol/L   O2 Saturation 98.3 %   Patient temperature 98.6    Nitric Oxide 20    Collection  site A-LINE    Drawn by COLLECTED BY NURSE    Sample type ARTERIAL DRAW    Allens test (pass/fail) PASS PASS  Glucose, capillary     Status: Abnormal   Collection Time: 10/26/16  6:08 AM  Result Value Ref Range   Glucose-Capillary 104 (H) 65 - 99 mg/dL  Glucose, capillary     Status: None   Collection Time: 10/26/16  6:54 AM  Result Value Ref Range   Glucose-Capillary 95 65 - 99 mg/dL   Comment 1 Arterial Specimen   Glucose, capillary     Status: None   Collection Time:   10/26/16  8:03 AM  Result Value Ref Range   Glucose-Capillary 92 65 - 99 mg/dL  .Cooxemetry Panel (carboxy, met, total hgb, O2 sat)     Status: Abnormal   Collection Time: 10/26/16 11:15 AM  Result Value Ref Range   Total hemoglobin 9.4 (L) 12.0 - 16.0 g/dL   O2 Saturation 36.5 %   Carboxyhemoglobin 1.1 0.5 - 1.5 %   Methemoglobin 1.0 0.0 - 1.5 %  I-STAT 4, (NA,K, GLUC, HGB,HCT)     Status: Abnormal   Collection Time: 10/26/16 11:16 AM  Result Value Ref Range   Sodium 145 135 - 145 mmol/L   Potassium 3.7 3.5 - 5.1 mmol/L   Glucose, Bld 109 (H) 65 - 99 mg/dL   HCT 26.0 (L) 39.0 - 52.0 %   Hemoglobin 8.8 (L) 13.0 - 17.0 g/dL  I-STAT 3, arterial blood gas (G3+)     Status: Abnormal   Collection Time: 10/26/16 11:21 AM  Result Value Ref Range   pH, Arterial 7.387 7.350 - 7.450   pCO2 arterial 44.7 32.0 - 48.0 mmHg   pO2, Arterial 80.0 (L) 83.0 - 108.0 mmHg   Bicarbonate 26.5 20.0 - 28.0 mmol/L   TCO2 28 0 - 100 mmol/L   O2 Saturation 95.0 %   Acid-Base Excess 2.0 0.0 - 2.0 mmol/L   Patient temperature 38.3 C    Collection site ARTERIAL LINE    Drawn by Operator    Sample type ARTERIAL   Glucose, capillary     Status: Abnormal   Collection Time: 10/26/16 11:21 AM  Result Value Ref Range   Glucose-Capillary 116 (H) 65 - 99 mg/dL  CBC     Status: Abnormal   Collection Time: 10/26/16 11:24 AM  Result Value Ref Range   WBC 7.7 4.0 - 10.5 K/uL   RBC 3.10 (L) 4.22 - 5.81 MIL/uL   Hemoglobin 9.1 (L) 13.0 -  17.0 g/dL   HCT 27.4 (L) 39.0 - 52.0 %   MCV 88.4 78.0 - 100.0 fL   MCH 29.4 26.0 - 34.0 pg   MCHC 33.2 30.0 - 36.0 g/dL   RDW 15.4 11.5 - 15.5 %   Platelets 98 (L) 150 - 400 K/uL    Comment: CONSISTENT WITH PREVIOUS RESULT  Protime-INR     Status: Abnormal   Collection Time: 10/26/16 11:24 AM  Result Value Ref Range   Prothrombin Time 22.2 (H) 11.4 - 15.2 seconds   INR 1.91   APTT     Status: Abnormal   Collection Time: 10/26/16 11:24 AM  Result Value Ref Range   aPTT 42 (H) 24 - 36 seconds    Comment:        IF BASELINE aPTT IS ELEVATED, SUGGEST PATIENT RISK ASSESSMENT BE USED TO DETERMINE APPROPRIATE ANTICOAGULANT THERAPY.   Hepatic function panel     Status: Abnormal   Collection Time: 10/26/16 12:14 PM  Result Value Ref Range   Total Protein 4.6 (L) 6.5 - 8.1 g/dL   Albumin 2.3 (L) 3.5 - 5.0 g/dL   AST 229 (H) 15 - 41 U/L   ALT 540 (H) 17 - 63 U/L   Alkaline Phosphatase 62 38 - 126 U/L   Total Bilirubin 0.7 0.3 - 1.2 mg/dL   Bilirubin, Direct 0.3 0.1 - 0.5 mg/dL   Indirect Bilirubin 0.4 0.3 - 0.9 mg/dL  Glucose, capillary     Status: Abnormal   Collection Time: 10/26/16 12:27 PM  Result Value Ref Range   Glucose-Capillary 108 (  H) 65 - 99 mg/dL  Glucose, capillary     Status: None   Collection Time: 10/26/16 12:53 PM  Result Value Ref Range   Glucose-Capillary 99 65 - 99 mg/dL   Comment 1 Arterial Specimen   .Cooxemetry Panel (carboxy, met, total hgb, O2 sat)     Status: None   Collection Time: 10/26/16  1:00 PM  Result Value Ref Range   Total hemoglobin 13.7 12.0 - 16.0 g/dL   O2 Saturation 36.5 %   Carboxyhemoglobin 1.0 0.5 - 1.5 %   Methemoglobin 0.9 0.0 - 1.5 %  .Cooxemetry Panel (carboxy, met, total hgb, O2 sat)     Status: Abnormal   Collection Time: 10/26/16  1:55 PM  Result Value Ref Range   Total hemoglobin 10.7 (L) 12.0 - 16.0 g/dL   O2 Saturation 56.3 %   Carboxyhemoglobin 1.3 0.5 - 1.5 %   Methemoglobin 1.0 0.0 - 1.5 %  .Cooxemetry Panel  (carboxy, met, total hgb, O2 sat)     Status: Abnormal   Collection Time: 10/26/16  1:55 PM  Result Value Ref Range   Total hemoglobin 11.0 (L) 12.0 - 16.0 g/dL   O2 Saturation 40.7 %   Carboxyhemoglobin 0.8 0.5 - 1.5 %   Methemoglobin 1.2 0.0 - 1.5 %  Glucose, capillary     Status: Abnormal   Collection Time: 10/26/16  1:58 PM  Result Value Ref Range   Glucose-Capillary 106 (H) 65 - 99 mg/dL  Prepare fresh frozen plasma     Status: None   Collection Time: 10/26/16  2:28 PM  Result Value Ref Range   Unit Number [id number]    Blood Component Type THAWED PLASMA    Unit division 00    Status of Unit ISSUED,FINAL    Transfusion Status OK TO TRANSFUSE    Unit Number [id number]    Blood Component Type THAWED PLASMA    Unit division 00    Status of Unit ISSUED,FINAL    Transfusion Status OK TO TRANSFUSE   Glucose, capillary     Status: Abnormal   Collection Time: 10/26/16  3:03 PM  Result Value Ref Range   Glucose-Capillary 102 (H) 65 - 99 mg/dL  Glucose, capillary     Status: None   Collection Time: 10/26/16  3:53 PM  Result Value Ref Range   Glucose-Capillary 89 65 - 99 mg/dL  Basic metabolic panel     Status: Abnormal   Collection Time: 10/26/16  4:35 PM  Result Value Ref Range   Sodium 144 135 - 145 mmol/L   Potassium 3.9 3.5 - 5.1 mmol/L   Chloride 109 101 - 111 mmol/L   CO2 25 22 - 32 mmol/L   Glucose, Bld 109 (H) 65 - 99 mg/dL   BUN 30 (H) 6 - 20 mg/dL   Creatinine, Ser 1.09 0.61 - 1.24 mg/dL   Calcium 7.6 (L) 8.9 - 10.3 mg/dL   GFR calc non Af Amer >60 >60 mL/min   GFR calc Af Amer >60 >60 mL/min    Comment: (NOTE) The eGFR has been calculated using the CKD EPI equation. This calculation has not been validated in all clinical situations. eGFR's persistently <60 mL/min signify possible Chronic Kidney Disease.    Anion gap 10 5 - 15  CBC     Status: Abnormal   Collection Time: 10/26/16  4:35 PM  Result Value Ref Range   WBC 8.5 4.0 - 10.5 K/uL   RBC 2.95  (L) 4.22 - 5.81 MIL/uL     Hemoglobin 8.7 (L) 13.0 - 17.0 g/dL   HCT 26.2 (L) 39.0 - 52.0 %   MCV 88.8 78.0 - 100.0 fL   MCH 29.5 26.0 - 34.0 pg   MCHC 33.2 30.0 - 36.0 g/dL   RDW 15.2 11.5 - 15.5 %   Platelets 118 (L) 150 - 400 K/uL    Comment: REPEATED TO VERIFY SPECIMEN CHECKED FOR CLOTS CONSISTENT WITH PREVIOUS RESULT   Magnesium     Status: Abnormal   Collection Time: 10/26/16  4:35 PM  Result Value Ref Range   Magnesium 2.6 (H) 1.7 - 2.4 mg/dL  I-STAT, chem 8     Status: Abnormal   Collection Time: 10/26/16  4:36 PM  Result Value Ref Range   Sodium 144 135 - 145 mmol/L   Potassium 3.8 3.5 - 5.1 mmol/L   Chloride 105 101 - 111 mmol/L   BUN 32 (H) 6 - 20 mg/dL   Creatinine, Ser 1.00 0.61 - 1.24 mg/dL   Glucose, Bld 105 (H) 65 - 99 mg/dL   Calcium, Ion 1.03 (L) 1.15 - 1.40 mmol/L   TCO2 25 0 - 100 mmol/L   Hemoglobin 8.2 (L) 13.0 - 17.0 g/dL   HCT 24.0 (L) 39.0 - 52.0 %  Glucose, capillary     Status: Abnormal   Collection Time: 10/26/16  5:01 PM  Result Value Ref Range   Glucose-Capillary 110 (H) 65 - 99 mg/dL  Glucose, capillary     Status: Abnormal   Collection Time: 10/26/16  6:11 PM  Result Value Ref Range   Glucose-Capillary 144 (H) 65 - 99 mg/dL  APTT     Status: Abnormal   Collection Time: 10/26/16  6:37 PM  Result Value Ref Range   aPTT 77 (H) 24 - 36 seconds    Comment:        IF BASELINE aPTT IS ELEVATED, SUGGEST PATIENT RISK ASSESSMENT BE USED TO DETERMINE APPROPRIATE ANTICOAGULANT THERAPY.   Glucose, capillary     Status: Abnormal   Collection Time: 10/26/16  7:01 PM  Result Value Ref Range   Glucose-Capillary 125 (H) 65 - 99 mg/dL  APTT     Status: Abnormal   Collection Time: 10/26/16  9:00 PM  Result Value Ref Range   aPTT 82 (H) 24 - 36 seconds    Comment:        IF BASELINE aPTT IS ELEVATED, SUGGEST PATIENT RISK ASSESSMENT BE USED TO DETERMINE APPROPRIATE ANTICOAGULANT THERAPY.   Glucose, capillary     Status: Abnormal   Collection Time:  10/26/16  9:03 PM  Result Value Ref Range   Glucose-Capillary 100 (H) 65 - 99 mg/dL   Comment 1 Arterial Specimen   Glucose, capillary     Status: None   Collection Time: 10/26/16 10:11 PM  Result Value Ref Range   Glucose-Capillary 99 65 - 99 mg/dL   Comment 1 Arterial Specimen   Glucose, capillary     Status: None   Collection Time: 10/27/16 12:09 AM  Result Value Ref Range   Glucose-Capillary 96 65 - 99 mg/dL   Comment 1 Arterial Specimen   Glucose, capillary     Status: Abnormal   Collection Time: 10/27/16  1:58 AM  Result Value Ref Range   Glucose-Capillary 124 (H) 65 - 99 mg/dL   Comment 1 Arterial Specimen   Glucose, capillary     Status: Abnormal   Collection Time: 10/27/16  3:01 AM  Result Value Ref Range   Glucose-Capillary 106 (H) 65 -  99 mg/dL   Comment 1 Arterial Specimen   Glucose, capillary     Status: Abnormal   Collection Time: 10/27/16  4:04 AM  Result Value Ref Range   Glucose-Capillary 110 (H) 65 - 99 mg/dL   Comment 1 Arterial Specimen   CBC     Status: Abnormal   Collection Time: 10/27/16  5:00 AM  Result Value Ref Range   WBC 8.6 4.0 - 10.5 K/uL   RBC 2.85 (L) 4.22 - 5.81 MIL/uL   Hemoglobin 8.3 (L) 13.0 - 17.0 g/dL   HCT 25.4 (L) 39.0 - 52.0 %   MCV 89.1 78.0 - 100.0 fL   MCH 29.1 26.0 - 34.0 pg   MCHC 32.7 30.0 - 36.0 g/dL   RDW 15.2 11.5 - 15.5 %   Platelets 128 (L) 150 - 400 K/uL  Basic metabolic panel     Status: Abnormal   Collection Time: 10/27/16  5:00 AM  Result Value Ref Range   Sodium 142 135 - 145 mmol/L   Potassium 3.9 3.5 - 5.1 mmol/L   Chloride 106 101 - 111 mmol/L   CO2 26 22 - 32 mmol/L   Glucose, Bld 119 (H) 65 - 99 mg/dL   BUN 38 (H) 6 - 20 mg/dL   Creatinine, Ser 1.22 0.61 - 1.24 mg/dL   Calcium 7.5 (L) 8.9 - 10.3 mg/dL   GFR calc non Af Amer 58 (L) >60 mL/min   GFR calc Af Amer >60 >60 mL/min    Comment: (NOTE) The eGFR has been calculated using the CKD EPI equation. This calculation has not been validated in all  clinical situations. eGFR's persistently <60 mL/min signify possible Chronic Kidney Disease.    Anion gap 10 5 - 15  Magnesium     Status: Abnormal   Collection Time: 10/27/16  5:00 AM  Result Value Ref Range   Magnesium 2.5 (H) 1.7 - 2.4 mg/dL  Hepatic function panel     Status: Abnormal   Collection Time: 10/27/16  5:00 AM  Result Value Ref Range   Total Protein 4.8 (L) 6.5 - 8.1 g/dL   Albumin 2.1 (L) 3.5 - 5.0 g/dL   AST 319 (H) 15 - 41 U/L   ALT 451 (H) 17 - 63 U/L   Alkaline Phosphatase 155 (H) 38 - 126 U/L   Total Bilirubin 1.6 (H) 0.3 - 1.2 mg/dL   Bilirubin, Direct 0.7 (H) 0.1 - 0.5 mg/dL   Indirect Bilirubin 0.9 0.3 - 0.9 mg/dL  APTT     Status: Abnormal   Collection Time: 10/27/16  5:00 AM  Result Value Ref Range   aPTT 86 (H) 24 - 36 seconds    Comment:        IF BASELINE aPTT IS ELEVATED, SUGGEST PATIENT RISK ASSESSMENT BE USED TO DETERMINE APPROPRIATE ANTICOAGULANT THERAPY.   Glucose, capillary     Status: Abnormal   Collection Time: 10/27/16  5:08 AM  Result Value Ref Range   Glucose-Capillary 109 (H) 65 - 99 mg/dL   Comment 1 Arterial Specimen   .Cooxemetry Panel (carboxy, met, total hgb, O2 sat)     Status: Abnormal   Collection Time: 10/27/16  5:10 AM  Result Value Ref Range   Total hemoglobin 10.1 (L) 12.0 - 16.0 g/dL   O2 Saturation 59.0 %   Carboxyhemoglobin 1.3 0.5 - 1.5 %   Methemoglobin 1.0 0.0 - 1.5 %  Glucose, capillary     Status: Abnormal   Collection Time: 10/27/16    6:53 AM  Result Value Ref Range   Glucose-Capillary 130 (H) 65 - 99 mg/dL   Comment 1 Arterial Specimen   Glucose, capillary     Status: Abnormal   Collection Time: 10/27/16  7:49 AM  Result Value Ref Range   Glucose-Capillary 16 (LL) 65 - 99 mg/dL   Comment 1 Notify RN   Glucose, capillary     Status: Abnormal   Collection Time: 10/27/16  7:53 AM  Result Value Ref Range   Glucose-Capillary 138 (H) 65 - 99 mg/dL  I-STAT 3, arterial blood gas (G3+)     Status: Abnormal    Collection Time: 10/27/16  8:46 AM  Result Value Ref Range   pH, Arterial 7.424 7.350 - 7.450   pCO2 arterial 41.9 32.0 - 48.0 mmHg   pO2, Arterial 71.0 (L) 83.0 - 108.0 mmHg   Bicarbonate 27.1 20.0 - 28.0 mmol/L   TCO2 28 0 - 100 mmol/L   O2 Saturation 93.0 %   Acid-Base Excess 3.0 (H) 0.0 - 2.0 mmol/L   Patient temperature 100.7 F    Collection site ARTERIAL LINE    Drawn by Nurse    Sample type ARTERIAL       Component Value Date/Time   SDES BLOOD RIGHT ANTECUBITAL 10/23/2016 1523   SPECREQUEST IN PEDIATRIC BOTTLE 3CC 10/23/2016 1523   CULT NO GROWTH 3 DAYS 10/23/2016 1523   REPTSTATUS PENDING 10/23/2016 1523   Dg Chest Port 1 View  Result Date: 10/27/2016 CLINICAL DATA:  Ventilator, chest tube EXAM: PORTABLE CHEST 1 VIEW COMPARISON:  10/26/2016 FINDINGS: Support devices including endotracheal tube and left chest tube remain in place, unchanged. No pneumothorax. Prior CABG. Cardiomegaly with vascular congestion and bilateral perihilar and lower lobe opacities, likely edema and atelectasis. Slight improved aeration since prior study. Right basilar chest tube also remains in place without pneumothorax. IMPRESSION: Continued bilateral perihilar and lower lobe opacities, likely a combination of edema and atelectasis, slightly improved since prior study. Bilateral chest tubes remain in place.  No visible pneumothorax. Electronically Signed   By: Kevin  Dover M.D.   On: 10/27/2016 08:20   Dg Chest Port 1 View  Result Date: 10/26/2016 CLINICAL DATA:  Confirm placement, post insertion. EXAM: PORTABLE CHEST 1 VIEW COMPARISON:  10/26/2016 FINDINGS: Postsurgical changes from CABG are stable. Stable appearance of the supporting lines and tubes, including endotracheal line terminating at the level of the clavicular heads, Swan-Ganz catheter terminating at the level of the expected main pulmonary artery, bilateral chest tubes, mediastinal drains, enteric catheter. Cardiomediastinal silhouette is  enlarged. There is no evidence of pneumothorax. There is persistent mixed pattern pulmonary edema with probably bilateral pleural effusions. Osseous structures are without acute abnormality. Soft tissues are grossly normal. IMPRESSION: Support lines and tubes as described. Pulmonary edema with bilateral pleural effusions. Electronically Signed   By: Dobrinka  Dimitrova M.D.   On: 10/26/2016 14:03   Dg Chest Port 1 View  Result Date: 10/26/2016 CLINICAL DATA:  Chest surgery.  Postoperative exam . EXAM: PORTABLE CHEST 1 VIEW COMPARISON:  10/26/2016 . FINDINGS: Endotracheal tube, NG tube, mediastinal drainage catheter, Swan-Ganz catheter, bilateral chest tubes in stable position. Interim placement of sternal wires. No pneumothorax. Stable cardiomegaly. Low lung volumes. IMPRESSION: 1. Lines and tubes including bilateral chest tubes in stable position. Interim placement of sternal wires. No pneumothorax. 2. Prior CABG and cardiac valve replacement. Stable cardiomegaly. Left stable low lung volumes. Electronically Signed   By: Thomas  Register   On: 10/26/2016 12:12   Dg   Chest Port 1 View  Result Date: 10/26/2016 CLINICAL DATA:  Respiratory failure, recent cardiac surgery. EXAM: PORTABLE CHEST 1 VIEW COMPARISON:  Portable chest x-ray of October 25, 2016 FINDINGS: The lungs are reasonably well inflated. There is no pneumothorax. The cardiac silhouette is enlarged. The pulmonary vascularity is engorged. The interstitial markings remain moderately increased bilaterally. Pleural fluid is likely present at the lung bases. Bilateral chest tubes remain in place as does a mediastinal drain. The endotracheal tube tip lies approximately 6.5 cm above the carina. A Swan-Ganz catheter has its tip in the proximal main pulmonary artery trunk. A prosthetic aortic valve is visible. Presumed packing material is noted in the left paratracheal region and appears unchanged. The esophagogastric tube tip projects below the inferior  margin of the image. IMPRESSION: Slight improved aeration today. Persistent interstitial edema bilaterally with bibasilar atelectasis and probable layering of pleural fluid on the right. Correlation as to the adequacy of the positioning of the Swan-Ganz catheter is needed. The other support tubes are in reasonable position. Electronically Signed   By: David  Martinique M.D.   On: 10/26/2016 07:33   Recent Results (from the past 240 hour(s))  Surgical pcr screen     Status: None   Collection Time: 10/21/16  4:18 AM  Result Value Ref Range Status   MRSA, PCR NEGATIVE NEGATIVE Final   Staphylococcus aureus NEGATIVE NEGATIVE Final    Comment:        The Xpert SA Assay (FDA approved for NASAL specimens in patients over 26 years of age), is one component of a comprehensive surveillance program.  Test performance has been validated by [facility] for patients greater than or equal to 63 year old. It is not intended to diagnose infection nor to guide or monitor treatment.   Culture, blood (Routine X 2) w Reflex to ID Panel     Status: None (Preliminary result)   Collection Time: 10/23/16  3:18 PM  Result Value Ref Range Status   Specimen Description BLOOD RIGHT HAND  Final   Special Requests IN PEDIATRIC BOTTLE 1CC  Final   Culture NO GROWTH 3 DAYS  Final   Report Status PENDING  Incomplete  Culture, blood (Routine X 2) w Reflex to ID Panel     Status: None (Preliminary result)   Collection Time: 10/23/16  3:23 PM  Result Value Ref Range Status   Specimen Description BLOOD RIGHT ANTECUBITAL  Final   Special Requests IN PEDIATRIC BOTTLE 3CC  Final   Culture NO GROWTH 3 DAYS  Final   Report Status PENDING  Incomplete      10/27/2016, 11:04 AM     LOS: 7 days    Records and images were personally reviewed where available.

## 2016-10-27 NOTE — Progress Notes (Signed)
*  PRELIMINARY RESULTS* Vascular Ultrasound Bilateral lower extremity venous duplex has been completed.  Preliminary findings: Difficult exam due to body habitus and edema. The visualized veins of the lower extremities are negative for deep vein thrombosis. There appears to be a small segment of non occlusive superficial vein thrombosis in the right proximal GSV above the site of harvest. Calf veins are difficult to visualize bilaterally. Negative for bakers cysts bilaterally.   Scott [name] 10/27/2016, 10:54 AM

## 2016-10-27 NOTE — Progress Notes (Signed)
CT surgery p.m. Rounds  Patient had pressure support weaning performed during the day which he tolerated Still with volume overload and coarse breath sounds-back on rest mode this p.m. Diuresing well greater than 100 cc/h P.m. labs reviewed and are satisfactory, creatinine 1.6, hct  27

## 2016-10-27 NOTE — Progress Notes (Signed)
PULMONARY / CRITICAL CARE MEDICINE   Name: Scott [name] MRN: [id number] DOB: Jul 08, 1946    ADMISSION DATE:  10/20/2016 CONSULTATION DATE: 12/1  REFERRING MD: Ricard Dillon  CHIEF COMPLAINT:  CAD  HISTORY OF PRESENT ILLNESS:   71 yo former smoker with known CAD and ascending aorta aneurysm, who was taken to OR 11/30 for cabg x 3, aortic root replacement, MVR replacement, thoracic cavity left open and had extended pump time in OR. On APRV ventilation post op, CI 1.6 and multiple pressors. PCCM asked to manage vent.  SUBJECTIVE:  Min O2 needs on vent Nitric at 2 PPM Swan out HITT sent Argatroban started Lasix drip this am   VITAL SIGNS: BP (!) 148/65 (BP Location: Right Arm)   Pulse 79   Temp (!) 100.7 F (38.2 C) (Oral)   Resp 18   Ht 5\' 9"  (1.753 m)   Wt 113.2 kg (249 lb 9 oz)   SpO2 100%   BMI 36.85 kg/m   HEMODYNAMICS: PAP: (41-54)/(17-29) 53/21 CVP:  [14 mmHg-20 mmHg] 16 mmHg CO:  [4.3 L/min] 4.3 L/min CI:  [2 L/min/m2] 2 L/min/m2  VENTILATOR SETTINGS: Vent Mode: PRVC FiO2 (%):  [40 %] 40 % Set Rate:  [16 bmp] 16 bmp Vt Set:  [570 mL] 570 mL PEEP:  [5 cmH20] 5 cmH20 Plateau Pressure:  [14 cmH20-17 cmH20] 15 cmH20  INTAKE / OUTPUT: I/O last 3 completed shifts: In: 7431.1 [I.V.:5957.1; Blood:551; NG/GT:308; IV [id number] Out: [id number] [Urine:3370; Drains:100; Blood:50; Chest Tube:1260]  PHYSICAL EXAMINATION: General: sedated on vent Neuro: rass -2, no FC, moves ect equally HEENT: Scleral edema  Unchanged, PERL  Cardiovascular: s1 s2 RRR distant, a paced Lungs:  Mild ronchi bilateral Abdomen:  Soft , BS low, no r Musculoskeletal:  intact Skin: cool dry  LABS:  BMET  Recent Labs Lab 10/26/16 0400  10/26/16 1635 10/26/16 1636 10/27/16 0500  NA 144  < > 144 144 142  K 3.2*  < > 3.9 3.8 3.9  CL 107  --  109 105 106  CO2 28  --  25  --  26  BUN 27*  --  30* 32* 38*  CREATININE 0.86  --  1.09 1.00 1.22  GLUCOSE 88  < > 109* 105* 119*  < > = values  in this interval not displayed.  Electrolytes  Recent Labs Lab 10/25/16 0500 10/26/16 0400 10/26/16 1635 10/27/16 0500  CALCIUM 7.8* 7.8* 7.6* 7.5*  MG 1.7 1.8 2.6* 2.5*  PHOS 1.9* 1.7*  --   --     CBC  Recent Labs Lab 10/26/16 1124 10/26/16 1635 10/26/16 1636 10/27/16 0500  WBC 7.7 8.5  --  8.6  HGB 9.1* 8.7* 8.2* 8.3*  HCT 27.4* 26.2* 24.0* 25.4*  PLT 98* 118*  --  128*    Coag's  Recent Labs Lab 10/25/16 1600 10/26/16 0400 10/26/16 1124 10/26/16 1837 10/26/16 2100 10/27/16 0500  APTT  --  41* 42* 77* 82* 86*  INR 1.88 1.96 1.91  --   --   --     Sepsis Markers No results for input(s): LATICACIDVEN, PROCALCITON, O2SATVEN in the last 168 hours.  ABG  Recent Labs Lab 10/26/16 0420 10/26/16 0510 10/26/16 1121  PHART TEST WILL BE CREDITED 7.462* 7.387  PCO2ART TEST WILL BE CREDITED 41.9 44.7  PO2ART TEST WILL BE CREDITED 103 80.0*    Liver Enzymes  Recent Labs Lab 10/25/16 0500 10/26/16 1214 10/27/16 0500  AST 643* 229* 319*  ALT 913* 540* 451*  ALKPHOS 49 62 155*  BILITOT 0.9 0.7 1.6*  ALBUMIN 2.4* 2.3* 2.1*    Cardiac Enzymes No results for input(s): TROPONINI, PROBNP in the last 168 hours.  Glucose  Recent Labs Lab 10/27/16 0301 10/27/16 0404 10/27/16 0508 10/27/16 0653 10/27/16 0749 10/27/16 0753  GLUCAP 106* 110* 109* 130* 16* 138*    Imaging Dg Chest Port 1 View  Result Date: 10/27/2016 CLINICAL DATA:  Ventilator, chest tube EXAM: PORTABLE CHEST 1 VIEW COMPARISON:  10/26/2016 FINDINGS: Support devices including endotracheal tube and left chest tube remain in place, unchanged. No pneumothorax. Prior CABG. Cardiomegaly with vascular congestion and bilateral perihilar and lower lobe opacities, likely edema and atelectasis. Slight improved aeration since prior study. Right basilar chest tube also remains in place without pneumothorax. IMPRESSION: Continued bilateral perihilar and lower lobe opacities, likely a combination of  edema and atelectasis, slightly improved since prior study. Bilateral chest tubes remain in place.  No visible pneumothorax. Electronically Signed   By: Rolm Baptise M.D.   On: 10/27/2016 08:20   Dg Chest Port 1 View  Result Date: 10/26/2016 CLINICAL DATA:  Confirm placement, post insertion. EXAM: PORTABLE CHEST 1 VIEW COMPARISON:  10/26/2016 FINDINGS: Postsurgical changes from CABG are stable. Stable appearance of the supporting lines and tubes, including endotracheal line terminating at the level of the clavicular heads, Swan-Ganz catheter terminating at the level of the expected main pulmonary artery, bilateral chest tubes, mediastinal drains, enteric catheter. Cardiomediastinal silhouette is enlarged. There is no evidence of pneumothorax. There is persistent mixed pattern pulmonary edema with probably bilateral pleural effusions. Osseous structures are without acute abnormality. Soft tissues are grossly normal. IMPRESSION: Support lines and tubes as described. Pulmonary edema with bilateral pleural effusions. Electronically Signed   By: Fidela Salisbury M.D.   On: 10/26/2016 14:03   Dg Chest Port 1 View  Result Date: 10/26/2016 CLINICAL DATA:  Chest surgery.  Postoperative exam . EXAM: PORTABLE CHEST 1 VIEW COMPARISON:  10/26/2016 . FINDINGS: Endotracheal tube, NG tube, mediastinal drainage catheter, Swan-Ganz catheter, bilateral chest tubes in stable position. Interim placement of sternal wires. No pneumothorax. Stable cardiomegaly. Low lung volumes. IMPRESSION: 1. Lines and tubes including bilateral chest tubes in stable position. Interim placement of sternal wires. No pneumothorax. 2. Prior CABG and cardiac valve replacement. Stable cardiomegaly. Left stable low lung volumes. Electronically Signed   By: Marcello Moores  Register   On: 10/26/2016 12:12     STUDIES:    CULTURES: 12-1 bc >>> 12/4 BC x 4>>>  ANTIBIOTICS: 11/30 levaquin>>  SIGNIFICANT EVENTS: 11/30 OHS>>  LINES/TUBES: 11.30  OTT>> 11/30 rt I j PA cath>>12/4 picc left 12/4>>> 11/30 l rad aline>> 11/30 CT x 4>> 11/30 wound vac>>12/4  DISCUSSION: 70 yo former smoker with known CAD and ascending aorta aneurysm, who was taken to OR 11/30 for cabg x 3, aortic root replacement, MVR replacement, thoracic cavity left open and had extended pump time in OR. On APRV ventilation post op, CI 1.6 and multiple pressors. PCCM asked to manage vent.  ASSESSMENT / PLAN:  PULMONARY A: Acute hypoxic resp failure ALI? OHS prolonged pump time Former smoker Chest closed 12/4 Presumed PE ( Rt atrial clot) ALI P:   Was pos 2.4 liters, agree lasix restart important and concentrate volume in  pcxr in am  His peaks are low, unlikely sig ALI / ARDS Nitric to off this am then ABG Last abg reviewed, keep same MV After ABG likley will wean cpap 5 ps5, with WUA- goal 2 hours assess RSBI,  ABG then Goal net neg 1 liter, may need bolus lasix Anticoagulation  CARDIOVASCULAR A:  Post cabg x 3, MVR, ascending aortic root repalcement Hypotension CAD CI 1.6->2.2, Svo2 rising P:  Wean levophed to off as goal May need to abort precedex if remains in on pressors Lasix May need to re assess RV fxn Nitric to off today in am   RENAL Lab Results  Component Value Date   CREATININE 1.22 10/27/2016   CREATININE 1.00 10/26/2016   CREATININE 1.09 10/26/2016   CREATININE 0.87 10/19/2016    A:   HypoK resolved P:   Chem in am Lasix drip cvp 16, may have PE and some contribution to baseline cvp Would favor bmet in pm on lasix drip  GASTROINTESTINAL A:   Hx of Gerd Shock liver resolving P:   PPI Feed in evening, pending if extubation takes place  HEMATOLOGIC  Recent Labs  10/26/16 1636 10/27/16 0500  HGB 8.2* 8.3*    A:   Thrombocytopenia (looking at course of plat and timing , unlikely HITT) RA clot P:  Amgiomax, per pharmacy HITT pending, SRA to follow,likey to have pos ELISA scd Duplex  legs  INFECTIOUS A:   Open chest cavity  P:   Levaquin per cvts Consider PCT assessments to limit exposure ABX, may have some slight plat flat rise with open chest prior  ENDOCRINE CBG (last 3)   Recent Labs  10/27/16 0653 10/27/16 0749 10/27/16 0753  GLUCAP 130* 16* 138*     A:   DM-controlled P:   SSI Insulin   NEUROLOGIC A:   Post OHS with prolonged pump time 11/30 Awake and follows commands 12/1 enceph P:   RASS goal: -1 WUA fent Precedex With prior lFT, may need lactulose, follow clinical status  FAMILY  - Updates: None at bedside  - Inter-disciplinary family meet or Palliative Care meeting due by: 12/8   Critical care time 35 min  Lavon Paganini. Titus Mould, MD, [place] Pgr: [facility]

## 2016-10-27 NOTE — Progress Notes (Signed)
Advanced Heart Failure Rounding Note  Referring Physician: Dr Roxy Manns Primary Physician: Dr Quintin Alto Primary Cardiologist:  Dr Percival Spanish   Reason for Consultation: Heart Failure   Subjective:    Admitted with chest pain. RHC//LHC with 3 vessel disease. Taken to the OR 11/30 for cabg x 3, repair of thoracic ascending aneurysm, AVR bioprosthetic,  MVR, and open chest with VAC placement.    Chest successfully closed 10/26/16.    Coox 59.0% this am with stable MAP.    Remains on amio drip 30 mg/hr, nor-epi 4 mcg and milrinone 0.3 mcg/kg/min currently.  Suspected HITT, he is on bivalirudin gtt.   Remains on lasix 8 mg/hr.  Positive > 2 L yesterday with chest closure. Weight up 3 more lbs. CVP 17  Pt remains intubated and sedated.  Low grade fevers, currently on levofloxacin. CXR with edema, no definite PNA.   TEE 10/26/16 EF 40-45%, small cavity size, flattened/hypokinetic septum, s/p MV repair with mild MR, mild to moderately decreased RV systolic function.   Objective:   Weight Range: 249 lb 9 oz (113.2 kg) Body mass index is 36.85 kg/m.   Vital Signs:   Temp:  [99.7 F (37.6 C)-101.3 F (38.5 C)] 100.7 F (38.2 C) (12/05 0700) Pulse Rate:  [79-89] 80 (12/05 0826) Resp:  [12-24] 16 (12/05 0826) BP: (90-168)/(51-67) 148/65 (12/05 0826) SpO2:  [91 %-100 %] 100 % (12/05 0826) Arterial Line BP: (89-178)/(36-71) 165/68 (12/05 0800) FiO2 (%):  [40 %] 40 % (12/05 0826) Weight:  [249 lb 9 oz (113.2 kg)] 249 lb 9 oz (113.2 kg) (12/05 0500) Last BM Date: 10/19/16  Weight change: Filed Weights   10/25/16 0500 10/26/16 0400 10/27/16 0500  Weight: 238 lb 8.6 oz (108.2 kg) 246 lb 7.6 oz (111.8 kg) 249 lb 9 oz (113.2 kg)    Intake/Output:   Intake/Output Summary (Last 24 hours) at 10/27/16 1021 Last data filed at 10/27/16 0900  Gross per 24 hour  Intake          5135.88 ml  Output             2745 ml  Net          2390.88 ml     Physical Exam: CVP 17-18 General:   Remains intubated and sedated.  HEENT: ETT in place. Neck: supple. JVP elevated to jaw. Carotids 2+ bilat; no bruits. No thyromegaly or nodule noted.  RIJ swan Cor: PMI nondisplaced. RRR. No M/G/R noted. Chest tubes in place. Pacing wires remain in place as well. Lungs: Mechanical breath sounds.  Rhonchorous.  Abdomen: soft, ND, no HSM. No bruits or masses. +BS  Extremities: no cyanosis, clubbing, rash, edema. L radial aline Neuro: Intubated and sedated. GU: Foley in place  Telemetry: Reviewed, A pacing in 80s  Labs: CBC  Recent Labs  10/26/16 1635 10/26/16 1636 10/27/16 0500  WBC 8.5  --  8.6  HGB 8.7* 8.2* 8.3*  HCT 26.2* 24.0* 25.4*  MCV 88.8  --  89.1  PLT 118*  --  400*   Basic Metabolic Panel  Recent Labs  10/25/16 0500 10/26/16 0400  10/26/16 1635 10/26/16 1636 10/27/16 0500  NA 141 144  < > 144 144 142  K 3.2* 3.2*  < > 3.9 3.8 3.9  CL 107 107  --  109 105 106  CO2 27 28  --  25  --  26  GLUCOSE 137* 88  < > 109* 105* 119*  BUN 20 27*  --  30* 32* 38*  CREATININE 0.80 0.86  --  1.09 1.00 1.22  CALCIUM 7.8* 7.8*  --  7.6*  --  7.5*  MG 1.7 1.8  --  2.6*  --  2.5*  PHOS 1.9* 1.7*  --   --   --   --   < > = values in this interval not displayed. Liver Function Tests  Recent Labs  10/26/16 1214 10/27/16 0500  AST 229* 319*  ALT 540* 451*  ALKPHOS 62 155*  BILITOT 0.7 1.6*  PROT 4.6* 4.8*  ALBUMIN 2.3* 2.1*   No results for input(s): LIPASE, AMYLASE in the last 72 hours. Cardiac Enzymes No results for input(s): CKTOTAL, CKMB, CKMBINDEX, TROPONINI in the last 72 hours.  BNP: BNP (last 3 results) No results for input(s): BNP in the last 8760 hours.  ProBNP (last 3 results) No results for input(s): PROBNP in the last 8760 hours.   D-Dimer No results for input(s): DDIMER in the last 72 hours. Hemoglobin A1C No results for input(s): HGBA1C in the last 72 hours. Fasting Lipid Panel No results for input(s): CHOL, HDL, LDLCALC, TRIG, CHOLHDL,  LDLDIRECT in the last 72 hours. Thyroid Function Tests No results for input(s): TSH, T4TOTAL, T3FREE, THYROIDAB in the last 72 hours.  Invalid input(s): FREET3  Other results:     Imaging/Studies:  Dg Chest Port 1 View  Result Date: 10/27/2016 CLINICAL DATA:  Ventilator, chest tube EXAM: PORTABLE CHEST 1 VIEW COMPARISON:  10/26/2016 FINDINGS: Support devices including endotracheal tube and left chest tube remain in place, unchanged. No pneumothorax. Prior CABG. Cardiomegaly with vascular congestion and bilateral perihilar and lower lobe opacities, likely edema and atelectasis. Slight improved aeration since prior study. Right basilar chest tube also remains in place without pneumothorax. IMPRESSION: Continued bilateral perihilar and lower lobe opacities, likely a combination of edema and atelectasis, slightly improved since prior study. Bilateral chest tubes remain in place.  No visible pneumothorax. Electronically Signed   By: Rolm Baptise M.D.   On: 10/27/2016 08:20   Dg Chest Port 1 View  Result Date: 10/26/2016 CLINICAL DATA:  Confirm placement, post insertion. EXAM: PORTABLE CHEST 1 VIEW COMPARISON:  10/26/2016 FINDINGS: Postsurgical changes from CABG are stable. Stable appearance of the supporting lines and tubes, including endotracheal line terminating at the level of the clavicular heads, Swan-Ganz catheter terminating at the level of the expected main pulmonary artery, bilateral chest tubes, mediastinal drains, enteric catheter. Cardiomediastinal silhouette is enlarged. There is no evidence of pneumothorax. There is persistent mixed pattern pulmonary edema with probably bilateral pleural effusions. Osseous structures are without acute abnormality. Soft tissues are grossly normal. IMPRESSION: Support lines and tubes as described. Pulmonary edema with bilateral pleural effusions. Electronically Signed   By: Fidela Salisbury M.D.   On: 10/26/2016 14:03   Dg Chest Port 1 View  Result  Date: 10/26/2016 CLINICAL DATA:  Chest surgery.  Postoperative exam . EXAM: PORTABLE CHEST 1 VIEW COMPARISON:  10/26/2016 . FINDINGS: Endotracheal tube, NG tube, mediastinal drainage catheter, Swan-Ganz catheter, bilateral chest tubes in stable position. Interim placement of sternal wires. No pneumothorax. Stable cardiomegaly. Low lung volumes. IMPRESSION: 1. Lines and tubes including bilateral chest tubes in stable position. Interim placement of sternal wires. No pneumothorax. 2. Prior CABG and cardiac valve replacement. Stable cardiomegaly. Left stable low lung volumes. Electronically Signed   By: Marcello Moores  Register   On: 10/26/2016 12:12   Dg Chest Port 1 View  Result Date: 10/26/2016 CLINICAL DATA:  Respiratory failure, recent cardiac surgery.  EXAM: PORTABLE CHEST 1 VIEW COMPARISON:  Portable chest x-ray of October 25, 2016 FINDINGS: The lungs are reasonably well inflated. There is no pneumothorax. The cardiac silhouette is enlarged. The pulmonary vascularity is engorged. The interstitial markings remain moderately increased bilaterally. Pleural fluid is likely present at the lung bases. Bilateral chest tubes remain in place as does a mediastinal drain. The endotracheal tube tip lies approximately 6.5 cm above the carina. A Swan-Ganz catheter has its tip in the proximal main pulmonary artery trunk. A prosthetic aortic valve is visible. Presumed packing material is noted in the left paratracheal region and appears unchanged. The esophagogastric tube tip projects below the inferior margin of the image. IMPRESSION: Slight improved aeration today. Persistent interstitial edema bilaterally with bibasilar atelectasis and probable layering of pleural fluid on the right. Correlation as to the adequacy of the positioning of the Swan-Ganz catheter is needed. The other support tubes are in reasonable position. Electronically Signed   By: David  Martinique M.D.   On: 10/26/2016 07:33    Latest Echo  Latest  Cath   Medications:     Scheduled Medications: . sodium chloride   Intravenous Once  . aspirin EC  325 mg Oral Daily   Or  . aspirin  324 mg Per Tube Daily  . bisacodyl  10 mg Oral Daily   Or  . bisacodyl  10 mg Rectal Daily  . chlorhexidine gluconate (MEDLINE KIT)  15 mL Mouth Rinse BID  . docusate sodium  200 mg Oral Daily  . famotidine (PEPCID) IV  20 mg Intravenous Q12H  . insulin aspart  0-24 Units Subcutaneous Q4H  . levofloxacin (LEVAQUIN) IV  750 mg Intravenous Q24H  . mouth rinse  15 mL Mouth Rinse QID  . sodium chloride flush  3 mL Intravenous Q12H    Infusions: . sodium chloride 20 mL/hr at 10/27/16 0400  . amiodarone 30 mg/hr (10/27/16 0800)  . bivalirudin (ANGIOMAX) infusion 0.5 mg/mL (Non-ACS indications) 0.09 mg/kg/hr (10/27/16 0800)  . dexmedetomidine 1.2 mcg/kg/hr (10/27/16 7322)  . fentaNYL infusion INTRAVENOUS 100 mcg/hr (10/27/16 0839)  . furosemide (LASIX) infusion 8 mg/hr (10/27/16 0800)  . milrinone 0.3 mcg/kg/min (10/27/16 0800)  . norepinephrine (LEVOPHED) Adult infusion 2 mcg/min (10/27/16 0800)    PRN Medications: albumin human, ipratropium-albuterol, lactated ringers, metoprolol, midazolam, morphine injection, ondansetron (ZOFRAN) IV, oxyCODONE, sodium chloride flush, traMADol   Assessment/Plan   Mr Dunshee is 70 year old admitted with CP. Complex operation this admission.  CABG x 3 + bioprosthetic AVR for severe bicuspid AS + ascending aorta replacement + MV repair for flail P2.  MV repair complicated by severe SAM, ended up having to remove posterior annuloplasty band.  Chest closed on 12/3.  1. CAD:  10/22/16 S/P CABG x3 with chest left open.  Chest successfully closed am of 10/26/16. - Continue ASA, start statin when LFTs come down.  2. Severe Aortic Stenosis: 11/302017 S/P AVR bioprostheic  3. Mitral regurgitation: Partial flail leaflet with MV repair.  Developed SAM initially after repair and posterior annuloplasty ring had to be removed.   SAM resolved. 4. Acute on chronic systolic CHF:  TEE at time of chest closure showed EF 40-45%, small cavity size, flattened/hypokinetic septum, s/p MV repair with mild MR, mild to moderately decreased RV systolic function, thrombus in RA.  He is volume overloaded with wet CXR and CVP 17.  Adequate co-ox today, 59%. - Increase diuresis, Lasix to 12 mg/hr.  - Continue current milrinone.  - Wean norepinephrine carefully, now  down to 2.  5. ID: Low grade fever, Tm 100.7.  ?Lung source.  Doubt endocarditis.  - Cultures sent 12/4.   - On levofloxacin, would broaden => note plan for ID to see.  6. Elevated LFTs: Follow, likely component of shock liver as well as CHF.  - No statin until LFTs come down.  7. HITT: Suspected HITT with thrombus in right atrium on last TEE.   - HIT Ab sent, pending.  - Now on bivalirudin.  Platelets increasing.  - Will need transition to warfarin eventually.  8. VDRF: CCM following for vent management. Pulmonary edema present, ?PNA.  9. Atrial fibrillation: Persistent post-op.  He is currently v-paced (still has pacing wires) with underlying atrial fibrillation.  - Continue anticoagulation.   - He is on amiodarone gtt.  - Eventually, would like cardioversion if he remains in atrial fibrillation but concerned about clotting risk in heart (possible HITT, known RA thrombus).   35 minutes critical care time.   Length of Stay: 7  Loralie Champagne 10/27/2016 11:31 AM  Advanced Heart Failure Team Pager [phone] (M-F; 7a - 4p)  Please contact [facility] for night-coverage after hours (4p -7a ) and weekends on [email or web address]

## 2016-10-27 NOTE — Progress Notes (Signed)
Pharmacy Antibiotic Note  Scott [name] is a 70 y.o. male admitted on 10/20/2016.  Pharmacy has been consulted for vancomycin and rifampin dosing for endocarditis.  Wt 113 kg, creat 1>1.22 corrected creat cl 57 ml/min. WBC WNL, temp 101.3.   Plan: vancomycin 2 gm loading dose per obesity dosing nomogram, then  Vancomycin 750 IV every 12 hours.  Goal trough 15-20 mcg/mL. Rifampin 300 mg IV q8h for endocarditis Ceftriaxone 2 gm IV q24 -rifampin may decrease plasma conc of amio  Height: 5\' 9"  (175.3 cm) Weight: 249 lb 9 oz (113.2 kg) IBW/kg (Calculated) : 70.7  Temp (24hrs), Avg:100.6 F (38.1 C), Min:99.7 F (37.6 C), Max:101.3 F (38.5 C)   Recent Labs Lab 10/25/16 0500 10/25/16 1600 10/26/16 0400 10/26/16 1124 10/26/16 1635 10/26/16 1636 10/27/16 0500  WBC 5.3 4.5 6.5 7.7 8.5  --  8.6  CREATININE 0.80  --  0.86  --  1.09 1.00 1.22    Estimated Creatinine Clearance: 69.9 mL/min (by C-G formula based on SCr of 1.22 mg/dL).    Allergies  Allergen Reactions  . Doxycycline Hives  . Penicillins Hives     Has patient had a PCN reaction causing immediate rash, facial/tongue/throat swelling, SOB or lightheadedness with hypotension: No Has patient had a PCN reaction causing severe rash involving mucus membranes or skin necrosis: No Has patient had a PCN reaction that required hospitalization: No Has patient had a PCN reaction occurring within the last 10 years: Yes If all of the above answers are "NO", then may proceed with Cephalosporin use.   . Sulfa Antibiotics Hives   Antimicrobials this admission:  Vanc 12/5>> Rifampin 12/5>> Lvq 11/30>>12/4 Dose adjustments this admission:    Microbiology results:  12/1 BCx: ngtd 11/29 MRSA PCR: neg 12/4 BC x2 perip 12/4 BC x2   Eudelia Bunch, Pharm.D. [id number] 10/27/2016 12:43 PM

## 2016-10-28 ENCOUNTER — Inpatient Hospital Stay (HOSPITAL_COMMUNITY): Payer: Medicare Other

## 2016-10-28 ENCOUNTER — Ambulatory Visit (HOSPITAL_COMMUNITY): Payer: Medicare Other

## 2016-10-28 DIAGNOSIS — I2510 Atherosclerotic heart disease of native coronary artery without angina pectoris: Secondary | ICD-10-CM

## 2016-10-28 DIAGNOSIS — R4182 Altered mental status, unspecified: Secondary | ICD-10-CM

## 2016-10-28 DIAGNOSIS — Z960 Presence of urogenital implants: Secondary | ICD-10-CM

## 2016-10-28 DIAGNOSIS — D65 Disseminated intravascular coagulation [defibrination syndrome]: Secondary | ICD-10-CM | POA: Diagnosis not present

## 2016-10-28 DIAGNOSIS — Z95828 Presence of other vascular implants and grafts: Secondary | ICD-10-CM

## 2016-10-28 DIAGNOSIS — I712 Thoracic aortic aneurysm, without rupture: Secondary | ICD-10-CM

## 2016-10-28 DIAGNOSIS — Z951 Presence of aortocoronary bypass graft: Secondary | ICD-10-CM

## 2016-10-28 DIAGNOSIS — I96 Gangrene, not elsewhere classified: Secondary | ICD-10-CM

## 2016-10-28 LAB — POCT I-STAT 3, ART BLOOD GAS (G3+)
ACID-BASE EXCESS: 4 mmol/L — AB (ref 0.0–2.0)
Acid-Base Excess: 4 mmol/L — ABNORMAL HIGH (ref 0.0–2.0)
BICARBONATE: 26.6 mmol/L (ref 20.0–28.0)
Bicarbonate: 28 mmol/L (ref 20.0–28.0)
O2 SAT: 97 %
O2 Saturation: 98 %
PCO2 ART: 39 mmHg (ref 32.0–48.0)
PO2 ART: 97 mmHg (ref 83.0–108.0)
PO2 ART: 90 mmHg (ref 83.0–108.0)
Patient temperature: 100.9
Patient temperature: 101.6
TCO2: 29 mmol/L (ref 0–100)
TCO2: 27 mmol/L (ref 0–100)
pCO2 arterial: 33.4 mmHg (ref 32.0–48.0)
pH, Arterial: 7.468 — ABNORMAL HIGH (ref 7.350–7.450)
pH, Arterial: 7.514 — ABNORMAL HIGH (ref 7.350–7.450)

## 2016-10-28 LAB — CBC
HEMATOCRIT: 25.1 % — AB (ref 39.0–52.0)
HEMOGLOBIN: 8.1 g/dL — AB (ref 13.0–17.0)
MCH: 29.6 pg (ref 26.0–34.0)
MCHC: 32.3 g/dL (ref 30.0–36.0)
MCV: 91.6 fL (ref 78.0–100.0)
Platelets: 155 10*3/uL (ref 150–400)
RBC: 2.74 MIL/uL — AB (ref 4.22–5.81)
RDW: 15.6 % — ABNORMAL HIGH (ref 11.5–15.5)
WBC: 8.3 10*3/uL (ref 4.0–10.5)

## 2016-10-28 LAB — CBC WITH DIFFERENTIAL/PLATELET
BASOS ABS: 0 10*3/uL (ref 0.0–0.1)
BLASTS: 0 %
Band Neutrophils: 0 %
Basophils Relative: 0 %
EOS PCT: 0 %
Eosinophils Absolute: 0 10*3/uL (ref 0.0–0.7)
HEMATOCRIT: 28 % — AB (ref 39.0–52.0)
HEMOGLOBIN: 9.2 g/dL — AB (ref 13.0–17.0)
LYMPHS PCT: 10 %
Lymphs Abs: 0.8 10*3/uL (ref 0.7–4.0)
MCH: 29.5 pg (ref 26.0–34.0)
MCHC: 32.9 g/dL (ref 30.0–36.0)
MCV: 89.7 fL (ref 78.0–100.0)
MONOS PCT: 6 %
Metamyelocytes Relative: 0 %
Monocytes Absolute: 0.5 10*3/uL (ref 0.1–1.0)
Myelocytes: 0 %
NEUTROS ABS: 6.3 10*3/uL (ref 1.7–7.7)
NEUTROS PCT: 84 %
NRBC: 0 /100{WBCs}
OTHER: 0 %
Platelets: 155 10*3/uL (ref 150–400)
Promyelocytes Absolute: 0 %
RBC: 3.12 MIL/uL — AB (ref 4.22–5.81)
RDW: 15.3 % (ref 11.5–15.5)
WBC: 7.6 10*3/uL (ref 4.0–10.5)

## 2016-10-28 LAB — GLUCOSE, CAPILLARY
GLUCOSE-CAPILLARY: 148 mg/dL — AB (ref 65–99)
GLUCOSE-CAPILLARY: 180 mg/dL — AB (ref 65–99)
GLUCOSE-CAPILLARY: 180 mg/dL — AB (ref 65–99)
GLUCOSE-CAPILLARY: 197 mg/dL — AB (ref 65–99)
Glucose-Capillary: 138 mg/dL — ABNORMAL HIGH (ref 65–99)
Glucose-Capillary: 148 mg/dL — ABNORMAL HIGH (ref 65–99)

## 2016-10-28 LAB — COOXEMETRY PANEL
Carboxyhemoglobin: 0.9 % (ref 0.5–1.5)
Methemoglobin: 1.1 % (ref 0.0–1.5)
O2 Saturation: 56.3 %
TOTAL HEMOGLOBIN: 7.9 g/dL — AB (ref 12.0–16.0)

## 2016-10-28 LAB — COMPREHENSIVE METABOLIC PANEL
ALBUMIN: 2 g/dL — AB (ref 3.5–5.0)
ALK PHOS: 163 U/L — AB (ref 38–126)
ALT: 528 U/L — ABNORMAL HIGH (ref 17–63)
AST: 599 U/L — AB (ref 15–41)
Anion gap: 8 (ref 5–15)
BILIRUBIN TOTAL: 1.3 mg/dL — AB (ref 0.3–1.2)
BUN: 55 mg/dL — AB (ref 6–20)
CALCIUM: 7.1 mg/dL — AB (ref 8.9–10.3)
CO2: 28 mmol/L (ref 22–32)
Chloride: 111 mmol/L (ref 101–111)
Creatinine, Ser: 1.42 mg/dL — ABNORMAL HIGH (ref 0.61–1.24)
GFR calc Af Amer: 56 mL/min — ABNORMAL LOW (ref >60)
GFR calc non Af Amer: 49 mL/min — ABNORMAL LOW (ref >60)
GLUCOSE: 149 mg/dL — AB (ref 65–99)
Potassium: 3.5 mmol/L (ref 3.5–5.1)
Sodium: 147 mmol/L — ABNORMAL HIGH (ref 135–145)
TOTAL PROTEIN: 4.6 g/dL — AB (ref 6.5–8.1)

## 2016-10-28 LAB — APTT: aPTT: 80 seconds — ABNORMAL HIGH (ref 24–36)

## 2016-10-28 LAB — PROCALCITONIN: Procalcitonin: 4.33 ng/mL

## 2016-10-28 LAB — POTASSIUM: Potassium: 3.2 mmol/L — ABNORMAL LOW (ref 3.5–5.1)

## 2016-10-28 LAB — PROTIME-INR
INR: 2.73
Prothrombin Time: 29.5 seconds — ABNORMAL HIGH (ref 11.4–15.2)

## 2016-10-28 LAB — SAVE SMEAR

## 2016-10-28 LAB — PREPARE RBC (CROSSMATCH)

## 2016-10-28 MED ORDER — VITAL HIGH PROTEIN PO LIQD
1000.0000 mL | ORAL | Status: DC
  Administered 2016-10-28: 1000 mL

## 2016-10-28 MED ORDER — SODIUM CHLORIDE 0.9 % IV SOLN
Freq: Once | INTRAVENOUS | Status: AC
  Administered 2016-10-28: 11:00:00 via INTRAVENOUS

## 2016-10-28 MED ORDER — SODIUM CHLORIDE 0.9 % IV SOLN
30.0000 meq | Freq: Once | INTRAVENOUS | Status: AC
  Administered 2016-10-28: 30 meq via INTRAVENOUS
  Filled 2016-10-28: qty 15

## 2016-10-28 MED ORDER — PRO-STAT SUGAR FREE PO LIQD
30.0000 mL | Freq: Two times a day (BID) | ORAL | Status: DC
  Administered 2016-10-28 – 2016-11-18 (×42): 30 mL
  Filled 2016-10-28 (×42): qty 30

## 2016-10-28 MED ORDER — DEXTROSE 5 % IV SOLN
INTRAVENOUS | Status: DC
  Administered 2016-10-28 – 2016-11-01 (×5): via INTRAVENOUS

## 2016-10-28 MED ORDER — VITAL HIGH PROTEIN PO LIQD
1000.0000 mL | ORAL | Status: AC
  Administered 2016-10-28 – 2016-11-08 (×10): 1000 mL

## 2016-10-28 MED ORDER — VANCOMYCIN HCL 10 G IV SOLR
1250.0000 mg | INTRAVENOUS | Status: DC
  Administered 2016-10-29 – 2016-11-02 (×5): 1250 mg via INTRAVENOUS
  Filled 2016-10-28 (×5): qty 1250

## 2016-10-28 NOTE — Progress Notes (Signed)
[facility] BRIEF SICU PROGRESS NOTE  2 Days Post-Op  S/P Procedure(s) (LRB): STERNAL WASHOUT AND DELAYED PRIMARY CLOSURE (N/A) TRANSESOPHAGEAL ECHOCARDIOGRAM (TEE) (N/A)   Stable day but remains encephalopathic.  Reportedly didn't do well w/ SBT - developed hypertension, agitation In and out of Afib - currently NSR BP stable, CVP 19 O2 sats 96-97% on 40% FiO2 Tube feeds started Excellent UOP  Plan: Continue current plan.  Recheck serum K+ and supplement as needed  Rexene Alberts, MD 10/28/2016 6:59 PM

## 2016-10-28 NOTE — Progress Notes (Addendum)
Advanced Heart Failure Rounding Note  Referring Physician: Dr Roxy Manns Primary Physician: Dr Quintin Alto Primary Cardiologist:  Dr Percival Spanish   Reason for Consultation: Heart Failure   Subjective:    Admitted with chest pain. RHC//LHC with 3 vessel disease. Taken to the OR 11/30 for cabg x 3, repair of thoracic ascending aneurysm, AVR bioprosthetic,  MVR, and open chest with VAC placement.    Chest successfully closed 10/26/16.    Coox 56% this am with stable MAP.    Remains on amio drip 30 mg/hr and milrinone 0.3 mcg/kg/min currently. Nor-epi off.  Suspected HITT, he is on bivalirudin gtt => HIT ab returned negative.  Seen by hematology, suspect he had DIC.   Tmax 102.9. ID following. Now getting vanco and rocephin => covering for prosthetic valve endocarditis.   Pt remains intubated. So far not waking up from sedation. Extremities cold and becoming cyanotic.   TEE 10/26/16 EF 40-45%, small cavity size, flattened/hypokinetic septum, s/p MV repair with mild MR, mild to moderately decreased RV systolic function. + RA thrombus vs vegetation  Objective:   Weight Range: 248 lb 3.8 oz (112.6 kg) Body mass index is 36.66 kg/m.   Vital Signs:   Temp:  [99.2 F (37.3 C)-102.9 F (39.4 C)] 99.2 F (37.3 C) (12/06 0755) Pulse Rate:  [25-124] 93 (12/06 0930) Resp:  [19-34] 31 (12/06 0930) BP: (89-180)/(53-87) 132/85 (12/06 0900) SpO2:  [83 %-100 %] 96 % (12/06 0930) Arterial Line BP: (85-225)/(45-84) 200/84 (12/06 0930) FiO2 (%):  [40 %-50 %] 40 % (12/06 0400) Weight:  [248 lb 3.8 oz (112.6 kg)] 248 lb 3.8 oz (112.6 kg) (12/06 0458) Last BM Date: 10/19/16  Weight change: Filed Weights   10/26/16 0400 10/27/16 0500 10/28/16 0458  Weight: 246 lb 7.6 oz (111.8 kg) 249 lb 9 oz (113.2 kg) 248 lb 3.8 oz (112.6 kg)    Intake/Output:   Intake/Output Summary (Last 24 hours) at 10/28/16 1017 Last data filed at 10/28/16 0900  Gross per 24 hour  Intake          3424.44 ml  Output              4090 ml  Net          -665.56 ml     Physical Exam: CVP 22 General: Remains intubated. Off sedation but remains unresponsive.  HEENT: ETT in place. Neck: supple. JVP elevated to jaw. Carotids 2+ bilat; no bruits. No thyromegaly or nodule noted.  RIJ swan Cor: PMI nondisplaced. RRR. No M/G/R noted. Chest tubes in place. Pacing wires remain in place as well. Lungs: Mechanical breath sounds.  Rhonchorous.  Abdomen: soft, ND, no HSM. No bruits or masses. +BS  Extremities: no clubbing, rash. L radial aline. 2+ edema. Hands and Feet COLD with cyanotic changes. Fingers cyanotic. Feet mottled.  Neuro: Intubated. Off sedation but un-responsive so far.  GU: Foley in place  Telemetry: Reviewed, Sinus Tach 100-110s  Labs: CBC  Recent Labs  10/27/16 1701 10/28/16 0500  WBC 11.6* 8.3  HGB 8.7* 8.1*  HCT 26.7* 25.1*  MCV 89.9 91.6  PLT 172 502   Basic Metabolic Panel  Recent Labs  10/26/16 0400  10/27/16 0500  10/27/16 1701 10/27/16 1940 10/28/16 0500  NA 144  < > 142  < >  --  146* 147*  K 3.2*  < > 3.9  < >  --  3.9 3.5  CL 107  < > 106  < >  --  111  111  CO2 28  < > 26  --   --  21* 28  GLUCOSE 88  < > 119*  < >  --  133* 149*  BUN 27*  < > 38*  < >  --  52* 55*  CREATININE 0.86  < > 1.22  < > 1.61* 1.58* 1.42*  CALCIUM 7.8*  < > 7.5*  --   --  7.2* 7.1*  MG 1.8  < > 2.5*  --  2.6*  --   --   PHOS 1.7*  --   --   --   --   --   --   < > = values in this interval not displayed. Liver Function Tests  Recent Labs  10/27/16 0500 10/28/16 0500  AST 319* 599*  ALT 451* 528*  ALKPHOS 155* 163*  BILITOT 1.6* 1.3*  PROT 4.8* 4.6*  ALBUMIN 2.1* 2.0*   No results for input(s): LIPASE, AMYLASE in the last 72 hours. Cardiac Enzymes No results for input(s): CKTOTAL, CKMB, CKMBINDEX, TROPONINI in the last 72 hours.  BNP: BNP (last 3 results) No results for input(s): BNP in the last 8760 hours.  ProBNP (last 3 results) No results for input(s): PROBNP in the last 8760  hours.   D-Dimer No results for input(s): DDIMER in the last 72 hours. Hemoglobin A1C No results for input(s): HGBA1C in the last 72 hours. Fasting Lipid Panel No results for input(s): CHOL, HDL, LDLCALC, TRIG, CHOLHDL, LDLDIRECT in the last 72 hours. Thyroid Function Tests No results for input(s): TSH, T4TOTAL, T3FREE, THYROIDAB in the last 72 hours.  Invalid input(s): FREET3  Other results:  Imaging/Studies:  Dg Chest Port 1 View  Result Date: 10/28/2016 CLINICAL DATA:  Intubated EXAM: PORTABLE CHEST 1 VIEW COMPARISON:  10/27/2016 FINDINGS: Cardiomediastinal silhouette is stable. Central vascular congestion and mild perihilar interstitial edema again noted. Persistent streaky bilateral basilar atelectasis or infiltrate. Endotracheal tube and NG tube is unchanged in position. Status post CABG. Bilateral chest tubes are again noted. Left subclavian PICC line is unchanged in position. There is no pneumothorax. IMPRESSION: Status post CABG. Stable support apparatus. Persistent mild congestion/pulmonary edema. Streaky bilateral basilar atelectasis or infiltrate. No pneumothorax. Electronically Signed   By: Lahoma Crocker M.D.   On: 10/28/2016 08:28   Dg Chest Port 1 View  Result Date: 10/27/2016 CLINICAL DATA:  Ventilator, chest tube EXAM: PORTABLE CHEST 1 VIEW COMPARISON:  10/26/2016 FINDINGS: Support devices including endotracheal tube and left chest tube remain in place, unchanged. No pneumothorax. Prior CABG. Cardiomegaly with vascular congestion and bilateral perihilar and lower lobe opacities, likely edema and atelectasis. Slight improved aeration since prior study. Right basilar chest tube also remains in place without pneumothorax. IMPRESSION: Continued bilateral perihilar and lower lobe opacities, likely a combination of edema and atelectasis, slightly improved since prior study. Bilateral chest tubes remain in place.  No visible pneumothorax. Electronically Signed   By: Rolm Baptise M.D.    On: 10/27/2016 08:20   Dg Chest Port 1 View  Result Date: 10/26/2016 CLINICAL DATA:  Confirm placement, post insertion. EXAM: PORTABLE CHEST 1 VIEW COMPARISON:  10/26/2016 FINDINGS: Postsurgical changes from CABG are stable. Stable appearance of the supporting lines and tubes, including endotracheal line terminating at the level of the clavicular heads, Swan-Ganz catheter terminating at the level of the expected main pulmonary artery, bilateral chest tubes, mediastinal drains, enteric catheter. Cardiomediastinal silhouette is enlarged. There is no evidence of pneumothorax. There is persistent mixed pattern pulmonary edema with  probably bilateral pleural effusions. Osseous structures are without acute abnormality. Soft tissues are grossly normal. IMPRESSION: Support lines and tubes as described. Pulmonary edema with bilateral pleural effusions. Electronically Signed   By: Fidela Salisbury M.D.   On: 10/26/2016 14:03   Dg Chest Port 1 View  Result Date: 10/26/2016 CLINICAL DATA:  Chest surgery.  Postoperative exam . EXAM: PORTABLE CHEST 1 VIEW COMPARISON:  10/26/2016 . FINDINGS: Endotracheal tube, NG tube, mediastinal drainage catheter, Swan-Ganz catheter, bilateral chest tubes in stable position. Interim placement of sternal wires. No pneumothorax. Stable cardiomegaly. Low lung volumes. IMPRESSION: 1. Lines and tubes including bilateral chest tubes in stable position. Interim placement of sternal wires. No pneumothorax. 2. Prior CABG and cardiac valve replacement. Stable cardiomegaly. Left stable low lung volumes. Electronically Signed   By: [place]   On: 10/26/2016 12:12    Medications:     Scheduled Medications: . sodium chloride   Intravenous Once  . aspirin EC  325 mg Oral Daily   Or  . aspirin  324 mg Per Tube Daily  . bisacodyl  10 mg Oral Daily   Or  . bisacodyl  10 mg Rectal Daily  . cefTRIAXone (ROCEPHIN)  IV  2 g Intravenous Q24H  . chlorhexidine gluconate (MEDLINE KIT)   15 mL Mouth Rinse BID  . docusate sodium  200 mg Oral Daily  . famotidine (PEPCID) IV  20 mg Intravenous Q12H  . insulin aspart  0-24 Units Subcutaneous Q4H  . mouth rinse  15 mL Mouth Rinse QID  . sodium chloride flush  3 mL Intravenous Q12H  . [START ON 10/29/2016] vancomycin  1,250 mg Intravenous Q24H    Infusions: . sodium chloride 20 mL/hr at 10/28/16 0400  . amiodarone 30 mg/hr (10/28/16 0400)  . bivalirudin (ANGIOMAX) infusion 0.5 mg/mL (Non-ACS indications) 0.07 mg/kg/hr (10/28/16 0843)  . dexmedetomidine 1.2 mcg/kg/hr (10/28/16 0500)  . dextrose 40 mL/hr at 10/28/16 1009  . fentaNYL infusion INTRAVENOUS 25 mcg/hr (10/28/16 0828)  . furosemide (LASIX) infusion 15 mg/hr (10/28/16 0839)  . milrinone 0.3 mcg/kg/min (10/28/16 0400)  . norepinephrine (LEVOPHED) Adult infusion Stopped (10/28/16 0800)    PRN Medications: ipratropium-albuterol, lactated ringers, metoprolol, midazolam, morphine injection, ondansetron (ZOFRAN) IV, oxyCODONE, sodium chloride flush, traMADol   Assessment/Plan   Mr Benally is 70 year old admitted with CP. Complex operation this admission.  CABG x 3 + bioprosthetic AVR for severe bicuspid AS + ascending aorta replacement + MV repair for flail P2.  MV repair complicated by severe SAM, ended up having to remove posterior annuloplasty band.  Chest closed on 12/3.   1. CAD:  10/22/16 S/P CABG x3 with chest left open.  Chest successfully closed am of 10/26/16. - Continue ASA, start statin when LFTs come down.  2. Severe Aortic Stenosis: 11/302017 S/P AVR bioprostheic  3. Mitral regurgitation: Partial flail leaflet with MV repair.  Developed SAM initially after repair and posterior annuloplasty ring had to be removed.  SAM resolved. 4. Acute on chronic systolic CHF:  TEE at time of chest closure showed EF 40-45%, small cavity size, flattened/hypokinetic septum, s/p MV repair with mild MR, mild to moderately decreased RV systolic function, thrombus in RA.  - Coox  56.3% this am. CVP up to 22 - Agree with increase lasix to 15 mg/hr. Can consider intermittent metolazone if poor diuresis.  - Continue milrinone 0.3 mcg/kg/min.  - Off norepi.  Pressures stable currently.  5. ID:  - Tmax 102.9 .  - ? Source.   ?  Lung source.  Doubt endocarditis (but covering for possible prosthetic valve endocarditis).  - Cultures sent 12/4.  NGTD - Now on ceftriaxone and Vanc. ID following.  6. Elevated LFTs:  - LFTs remains significantly elevated. Likely component of shock liver as well as CHF.  - No statin until LFTs come down.  7. HITT: Suspected HITT with thrombus in right atrium on last TEE.   - HIT Ab sent, negative.  Hematology saw, suspect DIC rather than HIT.  Remains on bivalirudin.  - Will need transition to warfarin eventually.  8. VDRF: CCM following for vent management. Pulmonary edema present, ?PNA.  9. Atrial fibrillation: Paroxysmal post-op. He is in and out of atrial fibrillation today on amiodarone.  - Continue anticoagulation => right now on bivalirudin.    - Eventually, would like cardioversion if he remains in atrial fibrillation but concerned about clotting risk in heart (possible HITT, known RA thrombus).   Prognosis guarded at this time.   Length of Stay: [place], [place] 10/28/2016 10:17 AM  Advanced Heart Failure Team Pager [phone] (M-F; 7a - 4p)  Please contact [facility] for night-coverage after hours (4p -7a ) and weekends on [email or web address]  Patient seen with PA, agree with the above note.  Off norepinephrine, remains on milrinone.  Very volume overloaded, increased Lasix to 15 mg/hr.  Would give metolazone in am if not significantly negative.   Covering with vanco/ceftriaxone, no definite source.   In and out of atrial fibrillation on amiodarone.    Suspect he had DIC rather than HIT.  Hematology following.  Still on bivalirudin, will eventually need transition to coumadin.   Loralie Champagne 10/28/2016 2:45  PM

## 2016-10-28 NOTE — Progress Notes (Signed)
ANTICOAGULATION and ANTIBIOTIC CONSULT NOTE  Pharmacy Consult for Bivalirudin and Vancomycin Indication: R/O clot in R-atrium and R/O HIT, endocarditis  Allergies  Allergen Reactions  . Doxycycline Hives  . Penicillins Hives     Has patient had a PCN reaction causing immediate rash, facial/tongue/throat swelling, SOB or lightheadedness with hypotension: No Has patient had a PCN reaction causing severe rash involving mucus membranes or skin necrosis: No Has patient had a PCN reaction that required hospitalization: No Has patient had a PCN reaction occurring within the last 10 years: Yes If all of the above answers are "NO", then may proceed with Cephalosporin use.   . Sulfa Antibiotics Hives    Patient Measurements: Height: 5\' 9"  (175.3 cm) Weight: 248 lb 3.8 oz (112.6 kg) IBW/kg (Calculated) : 70.7  Vital Signs: Temp: 99.2 F (37.3 C) (12/06 0755) Temp Source: Axillary (12/06 0755) BP: 114/68 (12/06 0800) Pulse Rate: 25 (12/06 0755)  Labs:  Recent Labs  10/26/16 0400  10/26/16 1124  10/26/16 2100 10/27/16 0500 10/27/16 1656 10/27/16 1701 10/27/16 1940 10/28/16 0500  HGB 9.1*  < > 9.1*  < >  --  8.3* 7.5* 8.7*  --  8.1*  HCT 27.0*  < > 27.4*  < >  --  25.4* 22.0* 26.7*  --  25.1*  PLT 84*  --  98*  < >  --  128*  --  172  --  155  APTT 41*  --  42*  < > 82* 86*  --   --   --  80*  LABPROT 22.6*  --  22.2*  --   --   --   --   --   --  29.5*  INR 1.96  --  1.91  --   --   --   --   --   --  2.73  CREATININE 0.86  --   --   < >  --  1.22 1.70* 1.61* 1.58* 1.42*  < > = values in this interval not displayed.  Estimated Creatinine Clearance: 59.9 mL/min (by C-G formula based on SCr of 1.42 mg/dL (H)).    Assessment: 70yo male s/p sternal washout POD#2 with possible R-atrial clot HIT, on bivalirudin. -aPTT= 80. within goal on 0.08 mg/kg/hr, Hg 8.1, pltc improving at 155 (172 last PM). No active bleeding reported. HIT antibody 0.157- negative.  ID: vanc/CTX  for  endocarditis; Wt 113 kg, creat 1>1.22 > 1.7>1.61>1.58>1.42 norm creat cl  44-49 ml/min. UOP 1.1 ml/kg/hr yest, 0.8 over night WBC WNL, temp 102.9, PCT 4.33 Antimicrobials this admission:  Vanc 12/5>> Rifampin 12/5 x 1 dose - [place] dc'd 2nd incr LFTs Lvq 11/30>>12/4 CTX 12/4>> Dose adjustments this admission:  12/6 Vanc dose changed 12/6 due to renal fxn  Microbiology results:  12/1 BCx: ngtd 11/29 MRSA PCR: neg 12/4 BC x2 perip 12/4 BC x2   Goal of Therapy:  APTT 50-85sec Monitor platelets by anticoagulation protocol: Yes    Plan -Decrease bivalirudin slightly to 0.07 mg/kg/hr to avoid going over the goal -aPTT and CBC daily - change vancomycin from 750 mg IV q12 to 1250 mg IV q24  Eudelia Bunch, Pharm.D. [id number] 10/28/2016 8:32 AM

## 2016-10-28 NOTE — Progress Notes (Addendum)
INFECTIOUS DISEASE PROGRESS NOTE  ID: Scott [name] is a 70 y.o. male with  Principal Problem:   S/P aortic valve replacement + mitral valve repair + CABG x3 + repair of ascending thoracic aortic aneurysm Active Problems:   Severe aortic stenosis by prior echocardiography   Unstable angina (HCC)   Aortic valve stenosis   Coronary artery disease involving native coronary artery of native heart with unstable angina pectoris (HCC)   Bicuspid aortic valve   Thoracic ascending aortic aneurysm (HCC)   Mitral regurgitation due to cusp prolapse   S/P ascending aortic aneurysm repair   S/P mitral valve repair   S/P CABG x 3   Cardiogenic shock (HCC)   Postoperative atrial fibrillation (HCC)   Thrombocytopenia (HCC)   Acute combined systolic and diastolic congestive heart failure ([place])   Acute respiratory failure (HCC)  Subjective: Sedated, does not wake up to answer question.  Abtx:  Anti-infectives    Start     Dose/Rate Route Frequency Ordered Stop   10/29/16 0600  vancomycin (VANCOCIN) 1,250 mg in sodium chloride 0.9 % 250 mL IVPB     1,250 mg 166.7 mL/hr over 90 Minutes Intravenous Every 24 hours 10/28/16 0900     10/28/16 0600  vancomycin (VANCOCIN) IVPB 750 mg/150 ml premix  Status:  Discontinued     750 mg 150 mL/hr over 60 Minutes Intravenous Every 12 hours 10/27/16 1226 10/28/16 0900   10/27/16 1400  cefTRIAXone (ROCEPHIN) 2 g in dextrose 5 % 50 mL IVPB     2 g 100 mL/hr over 30 Minutes Intravenous Every 24 hours 10/27/16 1203     10/27/16 1400  rifampin (RIFADIN) 300 mg in sodium chloride 0.9 % 100 mL IVPB  Status:  Discontinued     300 mg 200 mL/hr over 30 Minutes Intravenous Every 8 hours 10/27/16 1226 10/27/16 1426   10/27/16 1330  vancomycin (VANCOCIN) 2,000 mg in sodium chloride 0.9 % 500 mL IVPB     2,000 mg 250 mL/hr over 120 Minutes Intravenous  Once 10/27/16 1226 10/27/16 1451   10/27/16 1000  levofloxacin (LEVAQUIN) IVPB 750 mg  Status:  Discontinued     750 mg 100 mL/hr over 90 Minutes Intravenous Every 24 hours 10/26/16 1054 10/27/16 1203   10/26/16 1815  vancomycin (VANCOCIN) IVPB 1000 mg/200 mL premix     1,000 mg 200 mL/hr over 60 Minutes Intravenous  Once 10/26/16 1054 10/26/16 1752   10/26/16 0954  polymyxin B 500,000 Units, bacitracin 50,000 Units in sodium chloride irrigation 0.9 % 500 mL irrigation  Status:  Discontinued       As needed 10/26/16 0954 10/26/16 1030   10/26/16 0400  vancomycin (VANCOCIN) 1,500 mg in sodium chloride 0.9 % 250 mL IVPB     1,500 mg 125 mL/hr over 120 Minutes Intravenous To Surgery 10/25/16 1145 10/26/16 0925   10/26/16 0400  vancomycin (VANCOCIN) 1,000 mg in sodium chloride 0.9 % 1,000 mL irrigation      Irrigation To Surgery 10/25/16 1145 10/26/16 0953   10/26/16 0400  levofloxacin (LEVAQUIN) IVPB 500 mg     500 mg 100 mL/hr over 60 Minutes Intravenous To Surgery 10/25/16 1145 10/26/16 0925   10/23/16 1000  levofloxacin (LEVAQUIN) IVPB 750 mg     750 mg 100 mL/hr over 90 Minutes Intravenous Every 24 hours 10/22/16 2130 10/23/16 1047   10/23/16 0515  vancomycin (VANCOCIN) IVPB 1000 mg/200 mL premix     1,000 mg 200 mL/hr over 60 Minutes Intravenous  Once 10/22/16 2130 10/23/16 0600   10/22/16 2045  vancomycin (VANCOCIN) 1,250 mg in sodium chloride 0.9 % 250 mL IVPB  Status:  Discontinued     1,250 mg 166.7 mL/hr over 90 Minutes Intravenous To Surgery 10/22/16 2043 10/22/16 2130   10/22/16 2045  levofloxacin (LEVAQUIN) IVPB 500 mg  Status:  Discontinued     500 mg 100 mL/hr over 60 Minutes Intravenous To Surgery 10/22/16 2043 10/22/16 2130   10/22/16 0400  vancomycin (VANCOCIN) 1,250 mg in sodium chloride 0.9 % 250 mL IVPB     1,250 mg 166.7 mL/hr over 90 Minutes Intravenous To Surgery 10/21/16 1305 10/22/16 2247   10/22/16 0400  vancomycin (VANCOCIN) 1,000 mg in sodium chloride 0.9 % 1,000 mL irrigation      Irrigation To Surgery 10/21/16 1305 10/22/16 0819   10/22/16 0400  levofloxacin  (LEVAQUIN) IVPB 500 mg     500 mg 100 mL/hr over 60 Minutes Intravenous To Surgery 10/21/16 1305 10/22/16 2114      Medications:  I have reviewed the patient's current medications. Prior to Admission:  Prescriptions Prior to Admission  Medication Sig Dispense Refill Last Dose  . allopurinol (ZYLOPRIM) 300 MG tablet Take 300 mg by mouth daily.   10/19/2016 at Unknown time  . aspirin 81 MG tablet Take 81 mg by mouth 2 (two) times daily.    10/19/2016 at 2130  . Calcium-Magnesium-Zinc (CAL-MAG-ZINC PO) Take 1-2 tablets by mouth 2 (two) times daily. 1 tablet in the AM and 2 tablets in the PM.   10/19/2016 at Unknown time  . Cholecalciferol (VITAMIN D3) 1000 units CAPS Take 1,000 Units by mouth at bedtime.    10/19/2016 at Unknown time  . enalapril (VASOTEC) 20 MG tablet Take 20 mg by mouth 2 (two) times daily.   10/19/2016 at Unknown time  . finasteride (PROSCAR) 5 MG tablet Take 5 mg by mouth daily.    10/19/2016 at Unknown time  . gemfibrozil (LOPID) 600 MG tablet Take 600 mg by mouth 2 (two) times daily before a meal.   10/19/2016 at Unknown time  . Melatonin 10 MG CAPS Take 10 mg by mouth at bedtime.    10/19/2016 at Unknown time  . metFORMIN (GLUCOPHAGE-XR) 500 MG 24 hr tablet Take 500 mg by mouth at bedtime.    Past Week at Unknown time  . metoprolol succinate (TOPROL XL) 25 MG 24 hr tablet Take 1 tablet (25 mg total) by mouth daily. 30 tablet 11 10/19/2016 at Unknown time  . Omega-3 Fatty Acids (FISH OIL) 1000 MG CPDR Take 2,000 mg by mouth 2 (two) times daily.    10/19/2016 at Unknown time  . rOPINIRole (REQUIP) 2 MG tablet Take 2 mg by mouth See admin instructions. Takes 2mg  at 4:30pm and 2mg  at 9:30pm.   10/19/2016 at Unknown time  . Turmeric 500 MG CAPS Take 500 mg by mouth 2 (two) times daily.    10/19/2016 at Unknown time  . vitamin E 400 UNIT capsule Take 400 Units by mouth daily.   10/19/2016 at Unknown time  . nitroGLYCERIN (NITROSTAT) 0.4 MG SL tablet Place 0.4 mg under the  tongue every 5 (five) minutes as needed.    Unknown at Unknown time    Objective: Vital signs in last 24 hours: Temp:  [99.2 F (37.3 C)-102.9 F (39.4 C)] 99.2 F (37.3 C) (12/06 0755) Pulse Rate:  [25-99] 25 (12/06 0755) Resp:  [19-34] 21 (12/06 0800) BP: (89-180)/(53-87) 114/68 (12/06 0800) SpO2:  [91 %-100 %]  100 % (12/06 0800) Arterial Line BP: (85-225)/(45-77) 148/70 (12/06 0755) FiO2 (%):  [40 %-50 %] 40 % (12/06 0400) Weight:  [248 lb 3.8 oz (112.6 kg)] 248 lb 3.8 oz (112.6 kg) (12/06 0458)   Physical Exam  Constitutional: He appears well-developed and well-nourished.  Intubated, sedated.   HENT:  Head: Normocephalic and atraumatic.  Eyes:  Unable to assess EOM.   Cardiovascular: Normal rate and regular rhythm.   RRR, murmurs present.   Respiratory:  Course breathe sounds. Intubated.  Sternotomy site covered with dressing. Has chest tubes.   GI:  mildy distended.   Musculoskeletal:  Cyanotic fingers on both hands. All exts are cold to touch.  Neurological:  Sedated. Does not follow any commands. Does not open eyes or move any exts.    Lab Results  Recent Labs  10/27/16 1701 10/27/16 1940 10/28/16 0500  WBC 11.6*  --  8.3  HGB 8.7*  --  8.1*  HCT 26.7*  --  25.1*  NA  --  146* 147*  K  --  3.9 3.5  CL  --  111 111  CO2  --  21* 28  BUN  --  52* 55*  CREATININE 1.61* 1.58* 1.42*   Liver Panel  Recent Labs  10/26/16 1214 10/27/16 0500 10/28/16 0500  PROT 4.6* 4.8* 4.6*  ALBUMIN 2.3* 2.1* 2.0*  AST 229* 319* 599*  ALT 540* 451* 528*  ALKPHOS 62 155* 163*  BILITOT 0.7 1.6* 1.3*  BILIDIR 0.3 0.7*  --   IBILI 0.4 0.9  --    Sedimentation Rate No results for input(s): ESRSEDRATE in the last 72 hours. C-Reactive Protein No results for input(s): CRP in the last 72 hours.  Microbiology: Recent Results (from the past 240 hour(s))  Surgical pcr screen     Status: None   Collection Time: 10/21/16  4:18 AM  Result Value Ref Range Status   MRSA,  PCR NEGATIVE NEGATIVE Final   Staphylococcus aureus NEGATIVE NEGATIVE Final    Comment:        The Xpert SA Assay (FDA approved for NASAL specimens in patients over 68 years of age), is one component of a comprehensive surveillance program.  Test performance has been validated by [facility] for patients greater than or equal to 67 year old. It is not intended to diagnose infection nor to guide or monitor treatment.   Culture, blood (Routine X 2) w Reflex to ID Panel     Status: None (Preliminary result)   Collection Time: 10/23/16  3:18 PM  Result Value Ref Range Status   Specimen Description BLOOD RIGHT HAND  Final   Special Requests IN PEDIATRIC BOTTLE 1CC  Final   Culture NO GROWTH 4 DAYS  Final   Report Status PENDING  Incomplete  Culture, blood (Routine X 2) w Reflex to ID Panel     Status: None (Preliminary result)   Collection Time: 10/23/16  3:23 PM  Result Value Ref Range Status   Specimen Description BLOOD RIGHT ANTECUBITAL  Final   Special Requests IN PEDIATRIC BOTTLE 3CC  Final   Culture NO GROWTH 4 DAYS  Final   Report Status PENDING  Incomplete  Culture, blood (routine x 2)     Status: None (Preliminary result)   Collection Time: 10/26/16 12:45 PM  Result Value Ref Range Status   Specimen Description BLOOD RIGHT ANTECUBITAL  Final   Special Requests IN PEDIATRIC BOTTLE 1.5CC  Final   Culture NO GROWTH 1 DAY  Final   Report Status PENDING  Incomplete  Culture, blood (routine x 2)     Status: None (Preliminary result)   Collection Time: 10/26/16 12:50 PM  Result Value Ref Range Status   Specimen Description BLOOD RIGHT HAND  Final   Special Requests IN PEDIATRIC BOTTLE 3CC  Final   Culture NO GROWTH < 24 HOURS  Final   Report Status PENDING  Incomplete    Studies/Results: Dg Chest Port 1 View  Result Date: 10/28/2016 CLINICAL DATA:  Intubated EXAM: PORTABLE CHEST 1 VIEW COMPARISON:  10/27/2016 FINDINGS: Cardiomediastinal silhouette is stable. Central  vascular congestion and mild perihilar interstitial edema again noted. Persistent streaky bilateral basilar atelectasis or infiltrate. Endotracheal tube and NG tube is unchanged in position. Status post CABG. Bilateral chest tubes are again noted. Left subclavian PICC line is unchanged in position. There is no pneumothorax. IMPRESSION: Status post CABG. Stable support apparatus. Persistent mild congestion/pulmonary edema. Streaky bilateral basilar atelectasis or infiltrate. No pneumothorax. Electronically Signed   By: Lahoma Crocker M.D.   On: 10/28/2016 08:28   Dg Chest Port 1 View  Result Date: 10/27/2016 CLINICAL DATA:  Ventilator, chest tube EXAM: PORTABLE CHEST 1 VIEW COMPARISON:  10/26/2016 FINDINGS: Support devices including endotracheal tube and left chest tube remain in place, unchanged. No pneumothorax. Prior CABG. Cardiomegaly with vascular congestion and bilateral perihilar and lower lobe opacities, likely edema and atelectasis. Slight improved aeration since prior study. Right basilar chest tube also remains in place without pneumothorax. IMPRESSION: Continued bilateral perihilar and lower lobe opacities, likely a combination of edema and atelectasis, slightly improved since prior study. Bilateral chest tubes remain in place.  No visible pneumothorax. Electronically Signed   By: Rolm Baptise M.D.   On: 10/27/2016 08:20   Dg Chest Port 1 View  Result Date: 10/26/2016 CLINICAL DATA:  Confirm placement, post insertion. EXAM: PORTABLE CHEST 1 VIEW COMPARISON:  10/26/2016 FINDINGS: Postsurgical changes from CABG are stable. Stable appearance of the supporting lines and tubes, including endotracheal line terminating at the level of the clavicular heads, Swan-Ganz catheter terminating at the level of the expected main pulmonary artery, bilateral chest tubes, mediastinal drains, enteric catheter. Cardiomediastinal silhouette is enlarged. There is no evidence of pneumothorax. There is persistent mixed  pattern pulmonary edema with probably bilateral pleural effusions. Osseous structures are without acute abnormality. Soft tissues are grossly normal. IMPRESSION: Support lines and tubes as described. Pulmonary edema with bilateral pleural effusions. Electronically Signed   By: Fidela Salisbury M.D.   On: 10/26/2016 14:03   Dg Chest Port 1 View  Result Date: 10/26/2016 CLINICAL DATA:  Chest surgery.  Postoperative exam . EXAM: PORTABLE CHEST 1 VIEW COMPARISON:  10/26/2016 . FINDINGS: Endotracheal tube, NG tube, mediastinal drainage catheter, Swan-Ganz catheter, bilateral chest tubes in stable position. Interim placement of sternal wires. No pneumothorax. Stable cardiomegaly. Low lung volumes. IMPRESSION: 1. Lines and tubes including bilateral chest tubes in stable position. Interim placement of sternal wires. No pneumothorax. 2. Prior CABG and cardiac valve replacement. Stable cardiomegaly. Left stable low lung volumes. Electronically Signed   By: Marcello Moores  Register   On: 10/26/2016 12:12     Assessment/Plan:  Post cardiotomy shock -improving, off pressors today  Fever post cardiac surgery  Questionable vegetation vs clot on RA  Thrombocytopenia resolved - was most likely post op, less likely HIT, HIT panel pending. On angiomax.   Anemia post op, stable.   Persistently elevated LFT likely 2/2 to post cardiotomy shock  VDRF, CXR showing b/l atelectasis  and interstitial edema.   AMS - not waking up but was getting sedatives until yesterday. If continues to be sedated despite sedative meds being off could consider treating for hepatic encephalopathy with lactulose.  C   Fever is multifactorial, endocarditis, could be post op due to cytokine release and stress, could be 2/2 to atelectasis vs PNA, could be due to peripheral ischemia. Will treat for possible prosthetic valve related endocarditis for now with vanc + ceftriaxone, hold rifampin due to drug interactions and his elevated LFTs.  Reconsider as his LFTs improve. Will broaden coverage if further fevers- ceftaz to cover more gnr/pseudo His BCx will be the most important determinant in long term anbx for him.  levaquin 11/30, 12/01, 12/04 Vanc 12/6 >> Ceftriaxone 12/6>>         [name], [name] Infectious Diseases (pager) [phone] [email or web address] 10/28/2016, 9:14 AM  LOS: 8 days

## 2016-10-28 NOTE — Progress Notes (Signed)
*  PRELIMINARY RESULTS* Vascular Ultrasound Bilateral upper extremity venous duplex has been completed.  Preliminary findings: No evidence of deep or superficial vein thrombosis involving the visualized veins of the upper extremities.  Limited compression images due to poor patient position and immobility, also limited due to numerous bandages and dressings.  Scott [name] 10/28/2016, 3:35 PM

## 2016-10-28 NOTE — Progress Notes (Addendum)
PULMONARY / CRITICAL CARE MEDICINE   Name: KAPIL SARRA MRN: [id number] DOB: Nov 20, 1946    ADMISSION DATE:  10/20/2016 CONSULTATION DATE: 12/1  REFERRING MD: Ricard Dillon  CHIEF COMPLAINT:  CAD  HISTORY OF PRESENT ILLNESS:   70 yo former smoker with known CAD and ascending aorta aneurysm, who was taken to OR 11/30 for cabg x 3, aortic root replacement, MVR replacement, thoracic cavity left open and had extended pump time in OR. On APRV ventilation post op, CI 1.6 and multiple pressors. PCCM asked to manage vent.  SUBJECTIVE:  Sedated but on wean, looks good, good volumes, rate Finger tips ischemic Levophed off Neg 300 cc Fever, ID consult, ceftiaxone  VITAL SIGNS: BP 114/68   Pulse (!) 25   Temp 99.2 F (37.3 C) (Axillary)   Resp (!) 21   Ht 5\' 9"  (1.753 m)   Wt 112.6 kg (248 lb 3.8 oz)   SpO2 100%   BMI 36.66 kg/m   HEMODYNAMICS: CVP:  [14 mmHg-22 mmHg] 22 mmHg  VENTILATOR SETTINGS: Vent Mode: PRVC FiO2 (%):  [40 %-50 %] 40 % Set Rate:  [12 bmp] 12 bmp Vt Set:  [570 mL] 570 mL PEEP:  [5 cmH20] 5 cmH20 Plateau Pressure:  [12 cmH20] 12 cmH20  INTAKE / OUTPUT: I/O last 3 completed shifts: In: 5542.5 [I.V.:4587.5; NG/GT:90; IV Piggyback:865] Out: [id number] [Urine:4175; Emesis/NG output:300; Chest Tube:1060]  PHYSICAL EXAMINATION: General: sedated on vent, calm Neuro: rass -3 deep this am  HEENT: jvd remains, ett  Cardiovascular: s1 s2 irt mild Lungs:  Mild ronchi bilateral, about unchanged Abdomen:  Soft , BS low, no r Musculoskeletal:  intact Skin: cool dry  LABS:  BMET  Recent Labs Lab 10/27/16 0500 10/27/16 1656 10/27/16 1701 10/27/16 1940 10/28/16 0500  NA 142 145  --  146* 147*  K 3.9 4.1  --  3.9 3.5  CL 106 108  --  111 111  CO2 26  --   --  21* 28  BUN 38* 49*  --  52* 55*  CREATININE 1.22 1.70* 1.61* 1.58* 1.42*  GLUCOSE 119* 124*  --  133* 149*    Electrolytes  Recent Labs Lab 10/25/16 0500 10/26/16 0400 10/26/16 1635 10/27/16 0500  10/27/16 1701 10/27/16 1940 10/28/16 0500  CALCIUM 7.8* 7.8* 7.6* 7.5*  --  7.2* 7.1*  MG 1.7 1.8 2.6* 2.5* 2.6*  --   --   PHOS 1.9* 1.7*  --   --   --   --   --     CBC  Recent Labs Lab 10/27/16 0500 10/27/16 1656 10/27/16 1701 10/28/16 0500  WBC 8.6  --  11.6* 8.3  HGB 8.3* 7.5* 8.7* 8.1*  HCT 25.4* 22.0* 26.7* 25.1*  PLT 128*  --  172 155    Coag's  Recent Labs Lab 10/26/16 0400 10/26/16 1124  10/26/16 2100 10/27/16 0500 10/28/16 0500  APTT 41* 42*  < > 82* 86* 80*  INR 1.96 1.91  --   --   --  2.73  < > = values in this interval not displayed.  Sepsis Markers  Recent Labs Lab 10/27/16 0900 10/28/16 0500  PROCALCITON 4.56 4.33    ABG  Recent Labs Lab 10/27/16 0846 10/27/16 1125 10/28/16 0500  PHART 7.424 7.406 7.468*  PCO2ART 41.9 37.8 39.0  PO2ART 71.0* 60.0* 97.0    Liver Enzymes  Recent Labs Lab 10/26/16 1214 10/27/16 0500 10/28/16 0500  AST 229* 319* 599*  ALT 540* 451* 528*  ALKPHOS 62  155* 163*  BILITOT 0.7 1.6* 1.3*  ALBUMIN 2.3* 2.1* 2.0*    Cardiac Enzymes No results for input(s): TROPONINI, PROBNP in the last 168 hours.  Glucose  Recent Labs Lab 10/27/16 1128 10/27/16 1520 10/27/16 1947 10/27/16 2343 10/28/16 0402 10/28/16 0755  GLUCAP 108* 114* 123* 137* 138* 148*    Imaging No results found.   STUDIES:    CULTURES: 12-1 bc >>> 12/4 BC x 4>>>  ANTIBIOTICS: 11/30 levaquin>>  SIGNIFICANT EVENTS: 11/30 surgery, hypoxia 12/4 closed chest  LINES/TUBES: 11.30 OTT>> 11/30 rt I j PA cath>>12/4 picc left 12/4>>> 11/30 l rad aline>> 11/30 CT x 4>> 11/30 wound vac>>12/4   ASSESSMENT / PLAN:  PULMONARY A: Acute hypoxic resp failure ALI? OHS Former smoker Chest closed 12/4 Presumed PE ( Rt atrial clot) ALI Pulm edema P:   pcxr improved to some degree, still remains with pulm edema, cvp 22- escalate lasix given only neg 300 cc last 24 hours Anticoagulation ABG reviewed on rest mode-  reduce MV further Weaning now cpap 5 ps 5, goal 1 hr, assess ABG, rsbi, need total WUA, all sedation off  CARDIOVASCULAR A:  Post cabg x 3, MVR, ascending aortic root repalcement Hypotension resolved Fib CAD Rt atrial clot P:  Amio, may need bolus Anticoagulation Lasix, consider increase with free water for na  RENAL Lab Results  Component Value Date   CREATININE 1.42 (H) 10/28/2016   CREATININE 1.58 (H) 10/27/2016   CREATININE 1.61 (H) 10/27/2016   CREATININE 0.87 10/19/2016    A:   HypoK mild Hypernatremia P:   Chem in am Lasix drip, consider change to intermittent, would increase for now to 15 Consider add metalazone  GASTROINTESTINAL A:   Hx of Gerd Shock liver, some rise in LFt today NPO status P:   PPI Feed if not extubated by afternoon lft with bili in am, bili re assuring, LFt also likely from congestive hepatopathy, lasix  HEMATOLOGIC  Recent Labs  10/27/16 1701 10/28/16 0500  HGB 8.7* 8.1*    A:   Thrombocytopenia (looking at course of plat and timing , unlikely HITT) ELISA neg 12/4 RA clot P:  Amgiomax, per pharmacy- elisa neg, await SRA, also plat rise not indicative of HITT, could await sra and likely also neg and we can change to heparin  HITT pending, SRA to follow scd Duplex legs done with RA clot , doppler arms   INFECTIOUS A:   R/o endocarditis P:   Ceftriaxone, low threshold to change to nosocomial ceftaz PCT flat overall in setting open chest, NOT helpful   ENDOCRINE CBG (last 3)   Recent Labs  10/27/16 2343 10/28/16 0402 10/28/16 0755  GLUCAP 137* 138* 148*     A:   DM-controlled P:   SSI Insulin drip off, to SSI  NEUROLOGIC A:   Post OHS with prolonged pump time 11/30 Awake and follows commands 12/1 Encephalopathy  / oversedation P:   RASS goal: 0 WUA fent to off Precedex- to off With prior lFT, may need lactulose, bili currently wnl, re assess in am   FAMILY  - Updates: I updated daughter at  bedside 12/5  - Inter-disciplinary family meet or Palliative Care meeting due by: 12/8   Critical care time 35 min  Lavon Paganini. Titus Mould, MD, [place] Pgr: [facility]

## 2016-10-28 NOTE — Progress Notes (Addendum)
Nutrition Consult/Follow Up  DOCUMENTATION CODES:   Obesity unspecified  INTERVENTION:    Initiate Vital High Protein at goal rate of 55 ml/h (1320 ml per day) and Prostat 30 ml BID to provide 1520 kcals, 145 gm protein, 1103 ml free water daily  NUTRITION DIAGNOSIS:   Inadequate oral intake related to inability to eat as evidenced by NPO status, ongoing  GOAL:    Provide needs based on ASPEN/SCCM guidelines, progressing   MONITOR:   TF tolerance, Vent status, Labs, Weight trends, Skin, I & O's  ASSESSMENT:   70 yo former smoker with known CAD and ascending aorta aneurysm, who was taken to OR 11/30 for cabg x 3, aortic root replacement, MVR replacement, thoracic cavity left open and had extended pump time in OR. On APRV ventilation post op, CI 1.6 and multiple pressors. PCCM asked to manage vent.   Pt s/p procedures 11/30: AORTIC VALVE REPLACEMENT MITRAL VALVE REPAIR REPAIR OF ASCENDING THORACIC AORTIC ANEURYSM CORONARY ARTERY BYPASS GRAFTING x 3  Patient is currently intubated on ventilator support Temp (24hrs), Avg:100 F (37.8 C), Min:98.9 F (37.2 C), Max:102.9 F (39.4 C)  Vital High Protein formula initiated 12/2 >> D/C'd 12/3 PM. Pt went back to OR 12/4 for sternal washout and delayed primary closure. Per RN, plan is to try and extubate pt today. If unsuccessful to restart TF via OGT. CBG's [phone].  RD consulted for TF re-initiation & management today 1518.  Diet Order:  Diet NPO time specified  Skin:  Wound (see comment) (sternal wound VAC)  Last BM:  12/2  Height:   Ht Readings from Last 1 Encounters:  10/23/16 5\' 9"  (1.753 m)    Weight:   Wt Readings from Last 1 Encounters:  10/28/16 248 lb 3.8 oz (112.6 kg)    Ideal Body Weight:  72.7 kg  BMI:  Body mass index is 36.66 kg/m.  Estimated Nutritional Needs:   Kcal:  [id number]  Protein:  145-155 gm  Fluid:  per MD  EDUCATION NEEDS:   No education needs identified at this  time  Arthur Holms, RD, LDN Pager #: [phone] After-Hours Pager #: [phone]

## 2016-10-28 NOTE — Progress Notes (Addendum)
Pharmacy Heparin Induced Thrombocytopenia (HIT) Note:  Scott [name] is a 70 y.o. male  S/p AVR and CABG x3 on 11/30 (also had a sternal washout on 12/4) and being evaluated for HIT. Heparin started 11/28 and baseline platelets were 153 on 11/27. Platelets dropped to 63 on the day of surgery (bypass time was 477 minutes). No heparin has been documented since the surgery. Bivalirudin was started on 12/4 due to the suspicion of HIT (patient noted with a R atrial clot). Platelet rebound noted from 12/4 (118>>128>>172>>155). The heparin antibody is a low and negative.   Heparin Induced Plt Ab  Date/Time Value Ref Range Status  10/26/2016 11:24 AM 0.157 0.000 - 0.400 OD Final    Comment:    (NOTE) Performed At: [facility] [place], [place] [id number] Lindon Romp MD [id number]      Score = 0 Score = 1 Score = 2 Calculated Score  Thrombocytopenia -Platelet fall < 30% -Any platelet fall with nadir < 10 -Platelet fall >50% BUT surgery within 3 days  -Any combination of platelet fall/nadir that does not fit score 0 or 2 -Platelet fall >50% AND nadir ?20 AND no surgery within 3 days 1  Timing -Platelets fall on days ? 4  AND no prior heparin exposure in previous 100 days -Consistent w/ platelet fall on days 5-10 but missing counts -Platelet fall within 1 day of start AND prior exposure to heparin within previous 31-100 days -Platelet fall after day 10 -Platelet fall 5-10 days after starting heparin -Platelet fall within 1 day of heparin start AND prior exposure within previous 5-30 days 0  Thrombosis -Thrombosis suspected -Recurrent thrombosis in patient receiving therapeutic anticoagulants -Suspected thrombosis (awaiting confirmation via imaging) -Erythematous skin lesions at heparin injection sites -Confirmed new thrombosis -Skin necrosis at injection site -Anaphylactoid reaction to heparin IV bolus -Adrenal hemorrhage 2  oTher Causes -Probable other cause (see  protocol for full list) -Possible other cause evident (see protocol for full list) -No alternative explanation 1  Total Calculated 4T Score: 4   Name of MD Contacted: Dr. Roxy Manns  Recommendation: with a low heparin antibody and 4Ts score of 4. Could consider HIT ruled out. Discussed with Dr. Roxy Manns and plan is to continue bivalirudin and to confirm testing with an SRA  Allergy Documentation: possible HIT  Plan -Continue bivalirudin -SRA ordered on 12/6 -Will update allergies based on SRA results  Hildred Laser, Pharm D 10/28/2016 1:52 PM

## 2016-10-28 NOTE — Progress Notes (Signed)
[address]       [place] [address]             [phone]        CARDIOTHORACIC SURGERY PROGRESS NOTE   R2 Days Post-Op Procedure(s) (LRB): STERNAL WASHOUT AND DELAYED PRIMARY CLOSURE (N/A) TRANSESOPHAGEAL ECHOCARDIOGRAM (TEE) (N/A)  Subjective: Sedated on vent  Objective: Vital signs: BP Readings from Last 1 Encounters:  10/28/16 114/68   Pulse Readings from Last 1 Encounters:  10/28/16 (!) 25   Resp Readings from Last 1 Encounters:  10/28/16 (!) 21   Temp Readings from Last 1 Encounters:  10/28/16 99.2 F (37.3 C) (Axillary)    Hemodynamics: CVP:  [14 mmHg-22 mmHg] 22 mmHg  Mixed venous co-ox 56.3%   Physical Exam:  Rhythm:   Afib  Breath sounds: Coarse, no wheezing  Heart sounds:  irregular  Incisions:  Dressing dry, intact  Abdomen:  Soft, non-distended, scant BS's  Extremities:  Warm, adequately-perfused, dusky fingers both hands and soles of feet, unchanged  Chest tubes:  low volume thin serosanguinous output, no air leak    Intake/Output from previous day: 12/05 0701 - 12/06 0700 In: 3755.2 [I.V.:2850.2; NG/GT:90; IV Piggyback:815] Out: 3985 [Urine:3075; Emesis/NG output:300; Chest Tube:610] Intake/Output this shift: Total I/O In: 89.1 [I.V.:89.1] Out: 80 [Urine:60; Chest Tube:20]  Lab Results:  CBC: Recent Labs  10/27/16 1701 10/28/16 0500  WBC 11.6* 8.3  HGB 8.7* 8.1*  HCT 26.7* 25.1*  PLT 172 155    BMET:  Recent Labs  10/27/16 1940 10/28/16 0500  NA 146* 147*  K 3.9 3.5  CL 111 111  CO2 21* 28  GLUCOSE 133* 149*  BUN 52* 55*  CREATININE 1.58* 1.42*  CALCIUM 7.2* 7.1*     PT/INR:   Recent Labs  10/28/16 0500  LABPROT 29.5*  INR 2.73    CBG (last 3)   Recent Labs  10/27/16 2343 10/28/16 0402 10/28/16 0755  GLUCAP 137* 138* 148*    ABG    Component Value Date/Time   PHART 7.468 (H) 10/28/2016 0500   PCO2ART 39.0 10/28/2016 0500   PO2ART 97.0 10/28/2016 0500   HCO3 28.0  10/28/2016 0500   TCO2 29 10/28/2016 0500   ACIDBASEDEF 1.0 10/27/2016 1125   O2SAT 56.3 10/28/2016 0500   O2SAT 98.0 10/28/2016 0500    CXR: PORTABLE CHEST 1 VIEW  COMPARISON:  10/27/2016  FINDINGS: Cardiomediastinal silhouette is stable. Central vascular congestion and mild perihilar interstitial edema again noted. Persistent streaky bilateral basilar atelectasis or infiltrate. Endotracheal tube and NG tube is unchanged in position. Status post CABG. Bilateral chest tubes are again noted. Left subclavian PICC line is unchanged in position. There is no pneumothorax.  IMPRESSION: Status post CABG. Stable support apparatus. Persistent mild congestion/pulmonary edema. Streaky bilateral basilar atelectasis or infiltrate. No pneumothorax.   Electronically Signed   By: Lahoma Crocker M.D.   On: 10/28/2016 08:28   Assessment/Plan: S/P Procedure(s) (LRB): STERNAL WASHOUT AND DELAYED PRIMARY CLOSURE (N/A) TRANSESOPHAGEAL ECHOCARDIOGRAM (TEE) (N/A)  Clinically stable  Currently in Afib w/ stable hemodynamics on milrinone 0.3 and off levophed, co-ox 56% and CVP up 22 O2 sats 100% on 40% FiO2 and PEEP=5, CXR looks reasonably good Platelet count increasing, peripheral stigmata c/w possible HITT but HIT Ab negative, now on bivalirudin drip Multiple large mobile masses seen in RA on TEE most c/w clot - vegetations much less likely Low grade fevers w/out leukocytosis, likely reactive - all old central lines have been  removed and PICC line placed 12/4 Acutecombined systolic and diastolic CHF with expected post-op volume excess, I/O's even - slightly negative yesterday  Post op atrial fibrillation on IV amiodarone Expected post op acute blood loss anemia, Hgb down to 8.1 Elevated transaminases and prothrombin time likely due to shock liver - resolving   Rebolus amiodarone and try to get out of Afib   Consider repeat ECHO to f/u RV function and watch CVP closely  Continue  bivalirudin for presumed HITT - await serologic confirmation - will ultimately start warfarin  Continue vanc + Rocephin per I.D. team  Consider acute vent wean - hold fentanyl drip for now  Restart tube feeds later today or tomorrow if unable to wean vent soon  Transfuse 2 units PRBC's for increased O2 carrying capacity  Increase lasix drip  Continue milrinone for now  Rexene Alberts, MD 10/28/2016 9:08 AM

## 2016-10-28 NOTE — Consult Note (Signed)
Referring MD: Dr. Darylene Price  PCP:  Manon Hilding, MD   Reason for Referral:  Complex coagulopathy suspected DIC related to complicated, prolonged, vascular surgery with associated consumptive coagulopathy and thrombocytopenia      HPI:  70 year old man with no cardiac history prior to recent evaluation. He had treated hypertension, hyperlipidemia, type 2 diabetes on oral agent, and gout. He presented to cardiology on November 28 with a two-month history of progressive ischemic quality chest pain initially with exertion then at rest. Physical exam remarkable for a 3/6 apical systolic murmur. Echocardiogram showed severe aortic stenosis. Cardiac catheterization done November 28 confirmed severe aortic stenosis, advanced coronary disease with severe stenosis of the LAD at the bifurcation and a 60% right coronary stenosis. CT scan of the chest  showed a 4.2 cm aneurysm of the ascending aorta.  He was taken to the operating room initially on November 30. He underwent a bio prosthetic aortic valve replacement, mitral valve annuloplasty, (he was found to have severe mitral regurgitation and a flail mitral leaflet not appreciated on the echocardiogram preop), and resection of the ascending aortic aneurysm with placement of a vascular graft. Preoperatively platelet count 153,000. Prothrombin time 11.2 seconds. No prior history of any bleeding or clotting disorder. Intraoperatively, he developed a coagulopathy with diffuse oozing and subsequent clotting on the membrane oxygenator despite full dose heparin anticoagulation. Platelet count fell to 29,000. Fibrinogen fell to 67. He required poly-transfusion with 4 units of platelets, 6 units of FFP, 2 units of cryoprecipitate. He developed signs of diffuse microvascular ischemia, shock liver, and shock kidney. Baseline creatinine 0.8, rose to 1.7. Baseline transaminases normal rose to peak values of 1115 SGOT, and 1072, SGPT. Bilirubin rose from 0.92 peak  value of 1.6. Platelet count stabilized perioperatively and was 119,000 on December 1 then drifted down to 50,000 by December 3 which may represent the expected fall following platelet transfusions given intraoperatively. Subsequent steady rise up to 172,000 today. He was taken back to surgery for a sternal washout and primary wound closure on December 4. Intraoperative TEE suggestive of clots in the right atrium. Heparin antibody panel was sent on December 4. ELISA result not suggestive of heparin-induced thrombocytopenia with optical density 0.157. Confirmatory tests with serotonin release assay is pending.  At the present time, platelet count has normalized, liver and renal functions are improving. He has persistent, significant, ischemic changes of his digits upper and lower extremities.  Per his wife's history given to me today at the bedside, the patient and his family have no prior history of any bleeding or clotting disorders. Initial cardiology note says that the patient's father had a clotting problem. He has a sister age 40 currently being treated for various lung cancer. Another younger sister with no health problems. He has 3 healthy children.    Past Medical History:  Diagnosis Date  . Arthritis    "hips, shoulders; knees; back" (10/20/2016)  . Bicuspid aortic valve   . BPH (benign prostatic hypertrophy)   . Carpal tunnel syndrome of right wrist   . Coronary artery disease involving native coronary artery of native heart with unstable angina pectoris ([place]) 10/20/2016  . Diverticulitis   . GERD (gastroesophageal reflux disease)   . Gout   . Heart murmur   . Hyperlipemia   . Hypertension   . Postoperative atrial fibrillation ([place]) 10/24/2016  . RLS (restless legs syndrome)   . S/P aortic valve replacement with bioprosthetic valve 10/22/2016   25 mm [facility] Ease bovine  pericardial bioprosthetic tissue valve  . S/P ascending aortic aneurysm repair 10/22/2016   28 mm  supracoronary straight graft replacement of ascending thoracic aortic aneurysm  . S/P CABG x 3 10/22/2016   Sequential LIMA to Diag and LAD, SVG to distal LAD, open vein harvest right thigh  . S/P mitral valve repair 10/22/2016   Artificial Gore-tex neochord placement x6 - posterior annuloplasty band placed but removed due to systolic anterior motion of mitral valve  . Severe aortic stenosis   . Snores    Never been tested for sleep apnea  . Thoracic ascending aortic aneurysm (HCC) 10/21/2016  . Thrombocytopenia (HCC) 10/22/2016  . Type II diabetes mellitus (HCC)   :  Past Surgical History:  Procedure Laterality Date  . AORTIC VALVE REPLACEMENT N/A 10/22/2016   Procedure: AORTIC VALVE REPLACEMENT (AVR) WITH SIZE 25 MM MAGNA EASE PERICARDIAL BIOPROSTHESIS - AORTIC;  Surgeon: Clarence H Owen, MD;  Location: [facility] OR;  Service: Open Heart Surgery;  Laterality: N/A;  . CARDIAC CATHETERIZATION N/A 10/20/2016   Procedure: Right/Left Heart Cath and Coronary Angiography;  Surgeon: David W Harding, MD;  Location: [facility] INVASIVE CV LAB;  Service: Cardiovascular;  Laterality: N/A;  . CARPAL TUNNEL RELEASE Right 11/28/2013   Procedure: RIGHT WRIST CARPAL TUNNEL RELEASE;  Surgeon: Robert A Wainer, MD;  Location: [facility];  Service: Orthopedics;  Laterality: Right;  . CATARACT EXTRACTION W/ INTRAOCULAR LENS  IMPLANT, BILATERAL Bilateral 1978  . COLONOSCOPY    . CORONARY ARTERY BYPASS GRAFT N/A 10/22/2016   Procedure: CORONARY ARTERY BYPASS GRAFTING (CABG)x 2 WITH LIMA TO DIAGONAL, OPEN  HARVESTING OF RIGHT SAPHENOUS VEIN FOR VEIN GRAFT TO LAD;  Surgeon: Clarence H Owen, MD;  Location: [facility] OR;  Service: Open Heart Surgery;  Laterality: N/A;  . FRACTURE SURGERY    . INGUINAL HERNIA REPAIR Right 1998  . LIPOMA EXCISION Right 2008   "side of my head"  . MITRAL VALVE REPAIR N/A 10/22/2016   Procedure: MITRAL VALVE REPAIR (MVR) WITH SIZE 30 SORIN ANNULOFLEX ANNULOPLASTY RING WITH SUBSEQUENT REMOVAL  OF RING;  Surgeon: Clarence H Owen, MD;  Location: [facility] OR;  Service: Open Heart Surgery;  Laterality: N/A;  . PENECTOMY  2007   Peyronie's disease   . PENILE PROSTHESIS IMPLANT  2009  . SHOULDER OPEN ROTATOR CUFF REPAIR Right 2006  . STERNAL CLOSURE N/A 10/26/2016   Procedure: STERNAL WASHOUT AND DELAYED PRIMARY CLOSURE;  Surgeon: Clarence H Owen, MD;  Location: [facility] OR;  Service: Thoracic;  Laterality: N/A;  . TEE WITHOUT CARDIOVERSION N/A 10/26/2016   Procedure: TRANSESOPHAGEAL ECHOCARDIOGRAM (TEE);  Surgeon: Clarence H Owen, MD;  Location: [facility];  Service: Thoracic;  Laterality: N/A;  . TEE WITHOUT CARDIOVERSION N/A 10/22/2016   Procedure: TRANSESOPHAGEAL ECHOCARDIOGRAM (TEE);  Surgeon: Clarence H Owen, MD;  Location: [facility];  Service: Open Heart Surgery;  Laterality: N/A;  . THORACIC AORTIC ANEURYSM REPAIR  10/22/2016   Procedure: ASCENDING AORTIC  ANEURYSM REPAIR (AAA) WITH 28 MM HEMASHIELD PLATINUM WOVEN DOUBLE VELOUR VASCULAR GRAFT;  Surgeon: Clarence H Owen, MD;  Location: [facility];  Service: Open Heart Surgery;;  . TONSILLECTOMY  ~ 1955  . TRANSURETHRAL RESECTION OF PROSTATE  2005  . TRIGGER FINGER RELEASE Bilateral    several lt and rt hands  . TRIGGER FINGER RELEASE Right 11/28/2013   Procedure: RIGHT TRIGGER FINGER  RELEASE (TENDON SHEATH INCISION);  Surgeon: Robert A Wainer, MD;  Location: [facility];  Service: Orthopedics;  Laterality: Right;  . WRIST   FRACTURE SURGERY Right ~ 1959  :  . sodium chloride   Intravenous Once  . aspirin EC  325 mg Oral Daily   Or  . aspirin  324 mg Per Tube Daily  . bisacodyl  10 mg Oral Daily   Or  . bisacodyl  10 mg Rectal Daily  . cefTRIAXone (ROCEPHIN)  IV  2 g Intravenous Q24H  . chlorhexidine gluconate (MEDLINE KIT)  15 mL Mouth Rinse BID  . docusate sodium  200 mg Oral Daily  . famotidine (PEPCID) IV  20 mg Intravenous Q12H  . insulin aspart  0-24 Units Subcutaneous Q4H  . mouth rinse  15 mL Mouth Rinse QID  . sodium chloride  flush  3 mL Intravenous Q12H  . [START ON 10/29/2016] vancomycin  1,250 mg Intravenous Q24H  :  Allergies  Allergen Reactions  . Doxycycline Hives  . Penicillins Hives     Has patient had a PCN reaction causing immediate rash, facial/tongue/throat swelling, SOB or lightheadedness with hypotension: No Has patient had a PCN reaction causing severe rash involving mucus membranes or skin necrosis: No Has patient had a PCN reaction that required hospitalization: No Has patient had a PCN reaction occurring within the last 10 years: Yes If all of the above answers are "NO", then may proceed with Cephalosporin use.   . Sulfa Antibiotics Hives  :  Family History  Problem Relation Age of Onset  . Lung cancer Mother   . Clotting disorder Father     No details  . Heart disease Sister 50    Stents  . Cancer Sister     Throat  :  Social History   Social History  . Marital status: Married    Spouse name: N/A  . Number of children: N/A  . Years of education: N/A   Occupational History  . [employer]    retired   Social History Main Topics  . Smoking status: Former Smoker    Years: 3.00    Types: Pipe, Cigars    Quit date: 1984  . Smokeless tobacco: Never Used  . Alcohol use 6.0 oz/week    10 Cans of beer per week  . Drug use: No  . Sexual activity: Not Currently   Other Topics Concern  . Not on file   Social History Narrative  . No narrative on file  :  ROS: As obtained from his wife, unremarkable except as noted in the history of present illness.   Vitals: Vitals:   10/28/16 1245 10/28/16 1300  BP:  137/87  Pulse: 76 (!) 109  Resp: (!) 30 (!) 28  Temp:  99.6 F (37.6 C)    PHYSICAL EXAM: General appearance:Caucasian man who is heavily sedated and intubated. He will arouse to stimulation but cannot communicate verbally.  Multiple intravenous lines: PICC catheter in proximal left arm, peripheral IV mid left arm, arterial line left wrist,  previous puncture site in the right neck from IJ line currently removed, There is a wound medial right thigh from saphenous vein harvest, midsternal wound from bypass and valve surgery with overlying dressing which is clean and dry, Foley catheter draining clear urine. HEENT: Intubated. pupils equal round reactive to light.  Lymph Nodes: Resp: Coarse rhonchi over the anterior lungs.  Cardio: Regular rhythm. Probable pericardial friction rub. Sounds distorted by lung sounds.  Vascular:Unable to palpate the right radial artery. There is a left arterial line. No palpable right dorsalis pedis pulse. 3+ palpable left   dorsalis pedis pulse. No palpable posterior tibial pulse.  Breasts: GI: Abdomen is soft, nontender, no gross mass or organomegaly  GU:Foley catheter in place. Scrotal and penile edema.  Extremities: Advanced cyanotic changes of the fingers and toes both hands and both feet. Neurologic: Cannot assess due to heavy sedation. He is arousable. Pupils are equal round and reactive.  Skin:There is no oozing  blood from any of his wound sites or vascular catheter sites . no ecchymoses. No petechiae.   Labs:   Recent Labs  10/27/16 1701 10/28/16 0500  WBC 11.6* 8.3  HGB 8.7* 8.1*  HCT 26.7* 25.1*  PLT 172 155    Recent Labs  10/27/16 1940 10/28/16 0500  NA 146* 147*  K 3.9 3.5  CL 111 111  CO2 21* 28  GLUCOSE 133* 149*  BUN 52* 55*  CREATININE 1.58* 1.42*  CALCIUM 7.2* 7.1*    Blood smear review: Pending  Images Studies/Results:  Dg Chest Port 1 View  Result Date: 10/28/2016 CLINICAL DATA:  Intubated EXAM: PORTABLE CHEST 1 VIEW COMPARISON:  10/27/2016 FINDINGS: Cardiomediastinal silhouette is stable. Central vascular congestion and mild perihilar interstitial edema again noted. Persistent streaky bilateral basilar atelectasis or infiltrate. Endotracheal tube and NG tube is unchanged in position. Status post CABG. Bilateral chest tubes are again noted. Left subclavian PICC  line is unchanged in position. There is no pneumothorax. IMPRESSION: Status post CABG. Stable support apparatus. Persistent mild congestion/pulmonary edema. Streaky bilateral basilar atelectasis or infiltrate. No pneumothorax. Electronically Signed   By: Liviu  Pop M.D.   On: 10/28/2016 08:28   Dg Chest Port 1 View  Result Date: 10/27/2016 CLINICAL DATA:  Ventilator, chest tube EXAM: PORTABLE CHEST 1 VIEW COMPARISON:  10/26/2016 FINDINGS: Support devices including endotracheal tube and left chest tube remain in place, unchanged. No pneumothorax. Prior CABG. Cardiomegaly with vascular congestion and bilateral perihilar and lower lobe opacities, likely edema and atelectasis. Slight improved aeration since prior study. Right basilar chest tube also remains in place without pneumothorax. IMPRESSION: Continued bilateral perihilar and lower lobe opacities, likely a combination of edema and atelectasis, slightly improved since prior study. Bilateral chest tubes remain in place.  No visible pneumothorax. Electronically Signed   By: Kevin  Dover M.D.   On: 10/27/2016 08:20     Assessment: Principal Problem:   S/P aortic valve replacement + mitral valve repair + CABG x3 + repair of ascending thoracic aortic aneurysm Active Problems:   Severe aortic stenosis by prior echocardiography   Unstable angina (HCC)   Aortic valve stenosis   Coronary artery disease involving native coronary artery of native heart with unstable angina pectoris (HCC)   Bicuspid aortic valve   Thoracic ascending aortic aneurysm (HCC)   Mitral regurgitation due to cusp prolapse   S/P ascending aortic aneurysm repair   S/P mitral valve repair   S/P CABG x 3   Cardiogenic shock (HCC)   Postoperative atrial fibrillation (HCC)   Thrombocytopenia (HCC)   Acute combined systolic and diastolic congestive heart failure (HCC)   Acute respiratory failure (HCC)  Impression: I believe that the microvascular ischemic changes, renal and  hepatic dysfunction, distal cyanosis, thrombocytopenia, and acute clotting are the result of DIC, consumptive coagulopathy, which occurred intraoperatively. Time course and diffuse clotting and microvascular ischemia would not be typical of heparin-induced thrombocytopenia with thrombosis. Fortunately, organ function appears to be stabilizing with normalization of his platelets, and improvement of both renal and liver function.  Recommendation: He is in need of ongoing full dose   anticoagulation. I would continue the bivalirudin until he is more stable, extubated, and able to take by mouth. Fondaparinux (Arixtra) daily subcutaneous injection would be an alternative until further improvement in liver function before starting warfarin. This drug is cleared by the kidney but can be dose adjusted for moderate renal dysfunction. The molecule is too small to be able to induce platelet factor 4-heparin antibody complexes and therefore safe to use in patients with presumptive HIT.  I doubt he has any underlying coagulopathy other than that related to the acute events. I will screen him for antiphospholipid antibodies.   Murriel Hopper, MD, [place]  Hematology-Oncology/Internal Medicine  10/28/2016, 1:15 PM

## 2016-10-29 ENCOUNTER — Inpatient Hospital Stay (HOSPITAL_COMMUNITY): Payer: Medicare Other

## 2016-10-29 ENCOUNTER — Ambulatory Visit (HOSPITAL_COMMUNITY): Payer: Medicare Other

## 2016-10-29 DIAGNOSIS — A419 Sepsis, unspecified organism: Secondary | ICD-10-CM

## 2016-10-29 DIAGNOSIS — Z952 Presence of prosthetic heart valve: Secondary | ICD-10-CM

## 2016-10-29 DIAGNOSIS — J9600 Acute respiratory failure, unspecified whether with hypoxia or hypercapnia: Secondary | ICD-10-CM

## 2016-10-29 DIAGNOSIS — R945 Abnormal results of liver function studies: Secondary | ICD-10-CM

## 2016-10-29 DIAGNOSIS — R7989 Other specified abnormal findings of blood chemistry: Secondary | ICD-10-CM

## 2016-10-29 LAB — CBC WITH DIFFERENTIAL/PLATELET
BASOS ABS: 0.3 10*3/uL — AB (ref 0.0–0.1)
BASOS PCT: 3 %
EOS ABS: 0.1 10*3/uL (ref 0.0–0.7)
Eosinophils Relative: 1 %
HCT: 32 % — ABNORMAL LOW (ref 39.0–52.0)
Hemoglobin: 10.6 g/dL — ABNORMAL LOW (ref 13.0–17.0)
LYMPHS PCT: 18 %
Lymphs Abs: 1.8 10*3/uL (ref 0.7–4.0)
MCH: 30.3 pg (ref 26.0–34.0)
MCHC: 33.1 g/dL (ref 30.0–36.0)
MCV: 91.4 fL (ref 78.0–100.0)
Monocytes Absolute: 1.2 10*3/uL — ABNORMAL HIGH (ref 0.1–1.0)
Monocytes Relative: 12 %
NEUTROS PCT: 66 %
Neutro Abs: 6.6 10*3/uL (ref 1.7–7.7)
PLATELETS: 180 10*3/uL (ref 150–400)
RBC: 3.5 MIL/uL — ABNORMAL LOW (ref 4.22–5.81)
RDW: 16.3 % — ABNORMAL HIGH (ref 11.5–15.5)
WBC: 10 10*3/uL (ref 4.0–10.5)

## 2016-10-29 LAB — TYPE AND SCREEN
BLOOD PRODUCT EXPIRATION DATE: 201712202359
Blood Product Expiration Date: 201712202359
ISSUE DATE / TIME: 201712061411
ISSUE DATE / TIME: 201712061100
UNIT TYPE AND RH: 5100
Unit Type and Rh: 5100

## 2016-10-29 LAB — URINALYSIS, ROUTINE W REFLEX MICROSCOPIC
BILIRUBIN URINE: NEGATIVE
GLUCOSE, UA: NEGATIVE mg/dL
HGB URINE DIPSTICK: NEGATIVE
KETONES UR: NEGATIVE mg/dL
Leukocytes, UA: NEGATIVE
NITRITE: NEGATIVE
PH: 5 (ref 5.0–8.0)
Protein, ur: NEGATIVE mg/dL
SPECIFIC GRAVITY, URINE: 1.01 (ref 1.005–1.030)

## 2016-10-29 LAB — COMPREHENSIVE METABOLIC PANEL
ALK PHOS: 160 U/L — AB (ref 38–126)
ALT: 582 U/L — ABNORMAL HIGH (ref 17–63)
ANION GAP: 8 (ref 5–15)
AST: 875 U/L — ABNORMAL HIGH (ref 15–41)
Albumin: 2.1 g/dL — ABNORMAL LOW (ref 3.5–5.0)
BUN: 59 mg/dL — ABNORMAL HIGH (ref 6–20)
CALCIUM: 6.9 mg/dL — AB (ref 8.9–10.3)
CO2: 26 mmol/L (ref 22–32)
Chloride: 115 mmol/L — ABNORMAL HIGH (ref 101–111)
Creatinine, Ser: 1.34 mg/dL — ABNORMAL HIGH (ref 0.61–1.24)
GFR calc non Af Amer: 52 mL/min — ABNORMAL LOW (ref >60)
Glucose, Bld: 192 mg/dL — ABNORMAL HIGH (ref 65–99)
Potassium: 3.1 mmol/L — ABNORMAL LOW (ref 3.5–5.1)
SODIUM: 149 mmol/L — AB (ref 135–145)
Total Bilirubin: 1.6 mg/dL — ABNORMAL HIGH (ref 0.3–1.2)
Total Protein: 4.9 g/dL — ABNORMAL LOW (ref 6.5–8.1)

## 2016-10-29 LAB — COOXEMETRY PANEL
Carboxyhemoglobin: 1.4 % (ref 0.5–1.5)
METHEMOGLOBIN: 0.9 % (ref 0.0–1.5)
O2 Saturation: 56.4 %
TOTAL HEMOGLOBIN: 10.8 g/dL — AB (ref 12.0–16.0)

## 2016-10-29 LAB — GLUCOSE, CAPILLARY
GLUCOSE-CAPILLARY: 175 mg/dL — AB (ref 65–99)
GLUCOSE-CAPILLARY: 214 mg/dL — AB (ref 65–99)
GLUCOSE-CAPILLARY: 156 mg/dL — AB (ref 65–99)
Glucose-Capillary: 206 mg/dL — ABNORMAL HIGH (ref 65–99)
Glucose-Capillary: 150 mg/dL — ABNORMAL HIGH (ref 65–99)

## 2016-10-29 LAB — POCT I-STAT 3, ART BLOOD GAS (G3+)
ACID-BASE EXCESS: 3 mmol/L — AB (ref 0.0–2.0)
BICARBONATE: 26.6 mmol/L (ref 20.0–28.0)
O2 SAT: 97 %
PO2 ART: 90 mmHg (ref 83.0–108.0)
TCO2: 28 mmol/L (ref 0–100)
pCO2 arterial: 36.8 mmHg (ref 32.0–48.0)
pH, Arterial: 7.475 — ABNORMAL HIGH (ref 7.350–7.450)

## 2016-10-29 LAB — CULTURE, BLOOD (ROUTINE X 2)
CULTURE: NO GROWTH
CULTURE: NO GROWTH

## 2016-10-29 LAB — LACTATE DEHYDROGENASE: LDH: 1197 U/L — ABNORMAL HIGH (ref 98–192)

## 2016-10-29 LAB — PROTIME-INR
INR: 2.63
PROTHROMBIN TIME: 28.6 s — AB (ref 11.4–15.2)

## 2016-10-29 LAB — BASIC METABOLIC PANEL
ANION GAP: 7 (ref 5–15)
BUN: 55 mg/dL — ABNORMAL HIGH (ref 6–20)
CALCIUM: 6.4 mg/dL — AB (ref 8.9–10.3)
CHLORIDE: 113 mmol/L — AB (ref 101–111)
CO2: 28 mmol/L (ref 22–32)
Creatinine, Ser: 1.01 mg/dL (ref 0.61–1.24)
GFR calc Af Amer: >60 mL/min (ref >60)
GFR calc non Af Amer: >60 mL/min (ref >60)
GLUCOSE: 165 mg/dL — AB (ref 65–99)
POTASSIUM: 2.9 mmol/L — AB (ref 3.5–5.1)
Sodium: 148 mmol/L — ABNORMAL HIGH (ref 135–145)

## 2016-10-29 LAB — ECHOCARDIOGRAM LIMITED
HEIGHTINCHES: 69 in
Weight: 4127.01 oz

## 2016-10-29 LAB — CK: Total CK: 212 U/L (ref 49–397)

## 2016-10-29 LAB — APTT: APTT: 65 s — AB (ref 24–36)

## 2016-10-29 LAB — LACTIC ACID, PLASMA
LACTIC ACID, VENOUS: 2 mmol/L — AB (ref 0.5–1.9)
Lactic Acid, Venous: 2 mmol/L (ref 0.5–1.9)

## 2016-10-29 MED ORDER — SODIUM CHLORIDE 0.9 % IV SOLN
1.0000 g | Freq: Three times a day (TID) | INTRAVENOUS | Status: DC
  Administered 2016-10-29 – 2016-10-31 (×6): 1 g via INTRAVENOUS
  Filled 2016-10-29 (×9): qty 1

## 2016-10-29 MED ORDER — SODIUM CHLORIDE 0.9 % IV SOLN
30.0000 meq | Freq: Once | INTRAVENOUS | Status: AC
  Administered 2016-10-29: 30 meq via INTRAVENOUS
  Filled 2016-10-29: qty 15

## 2016-10-29 MED ORDER — METOLAZONE 5 MG PO TABS
2.5000 mg | ORAL_TABLET | Freq: Once | ORAL | Status: AC
  Administered 2016-10-29: 2.5 mg via ORAL
  Filled 2016-10-29: qty 1

## 2016-10-29 MED ORDER — POTASSIUM CHLORIDE 20 MEQ PO PACK
40.0000 meq | PACK | Freq: Three times a day (TID) | ORAL | Status: DC
  Administered 2016-10-29: 40 meq via ORAL
  Filled 2016-10-29 (×2): qty 2

## 2016-10-29 MED ORDER — POTASSIUM CHLORIDE 20 MEQ PO PACK
40.0000 meq | PACK | Freq: Once | ORAL | Status: AC
  Administered 2016-10-29: 40 meq
  Filled 2016-10-29: qty 2

## 2016-10-29 MED ORDER — SODIUM CHLORIDE 0.9 % IV SOLN
200.0000 mg | Freq: Once | INTRAVENOUS | Status: AC
  Administered 2016-10-29: 200 mg via INTRAVENOUS
  Filled 2016-10-29: qty 200

## 2016-10-29 MED ORDER — SODIUM CHLORIDE 0.9 % IV SOLN
100.0000 mg | INTRAVENOUS | Status: DC
  Administered 2016-10-30 – 2016-10-31 (×2): 100 mg via INTRAVENOUS
  Filled 2016-10-29 (×2): qty 100

## 2016-10-29 MED ORDER — DEXTROSE 5 % IV SOLN
0.0000 ug/min | INTRAVENOUS | Status: DC
  Administered 2016-10-30: 40 ug/min via INTRAVENOUS
  Administered 2016-10-30: 25 ug/min via INTRAVENOUS
  Administered 2016-10-30: 20 ug/min via INTRAVENOUS
  Administered 2016-10-31: 45 ug/min via INTRAVENOUS
  Administered 2016-11-01: 30 ug/min via INTRAVENOUS
  Administered 2016-11-02: 2 ug/min via INTRAVENOUS
  Filled 2016-10-29 (×10): qty 2

## 2016-10-29 NOTE — Progress Notes (Addendum)
CRITICAL VALUE ALERT  Critical value received:  Calcium 6.4, Lactic Acid 2.0  Date of notification:  10/29/16  Time of notification:  [id number]  Critical value read back:Yes.    Nurse who received alert:  Jake Bathe, RN  MD notified (1st page): [phone] called the black box  Time of first page:     MD notified (2nd page):  Time of second page:  Responding MD:  Melvyn Novas, CCM MD  Time MD responded:  1730, no new orders.  Rowe Pavy, RN

## 2016-10-29 NOTE — Progress Notes (Signed)
  Echocardiogram 2D Echocardiogram has been performed.  Donata Clay 10/29/2016, 11:59 AM

## 2016-10-29 NOTE — Progress Notes (Signed)
[facility] Progress Note Patient Name: Scott [name] DOB: 1946-07-21 MRN: [id number]   Date of Service  10/29/2016  HPI/Events of Note  Multiple issues: 1. Fever to 103.0 F - LFT's elevated and on Lasix IV infusion for renal insufficiency. Will avoid both Tylenol and NSAIDs and 2. Increased cyanosis for feet and hands. Patient has intact DP/PT and Radial and Ulnar pulses according to nurse. Already on Angiomax d/t prosthetic heart valves. I have little to offer from a medical point of view. Question if increased cyanosis is related to fever.  Defer further w/u and management of cyanosis to surgery. However, I am not sure that they will have much to add to management at present.   eICU Interventions  Will order: 1. Cooling blanket.      Intervention Category Intermediate Interventions: Infection - evaluation and management  Vear Staton Eugene 10/29/2016, 12:28 AM

## 2016-10-29 NOTE — Progress Notes (Signed)
Patient ID: DESHONE LYSSY, male   DOB: Nov 26, 1945, 70 y.o.   MRN: [id number]     Advanced Heart Failure Rounding Note  Referring Physician: Dr Roxy Manns Primary Physician: Dr Quintin Alto Primary Cardiologist:  Dr Percival Spanish   Reason for Consultation: Heart Failure   Subjective:    Admitted with chest pain. RHC//LHC with 3 vessel disease. Taken to the OR 11/30 for cabg x 3, repair of thoracic ascending aneurysm, AVR bioprosthetic,  MVR, and open chest with VAC placement.    Chest successfully closed 10/26/16.    Coox 56% again this am with stable MAP.    Remains on amio drip 30 mg/hr and milrinone 0.3 mcg/kg/min currently. Norepinephrine off.  He is in rate-controlled atrial fibrillation this morning.   CVP 18.  Good UOP with Lasix gtt but I still = O.   Suspect DIC and not HIT.  He remains on bivalirudin with RA clot and atrial fibrillation.   Tmax 104. ID following.  Coverage changed to vancomycin/meroponem.   LFTs rising.    Unable to doppler pedal pulses this morning, feet cold.   TEE 10/26/16 EF 40-45%, small cavity size, flattened/hypokinetic septum, s/p MV repair with mild MR, mild to moderately decreased RV systolic function. + RA thrombus.   Upper and lower extremity doppler studies: No DVTs  Objective:   Weight Range: 257 lb 15 oz (117 kg) Body mass index is 38.09 kg/m.   Vital Signs:   Temp:  [97.9 F (36.6 C)-104 F (40 C)] 97.9 F (36.6 C) (12/07 0915) Pulse Rate:  [33-132] 93 (12/07 0915) Resp:  [15-40] 23 (12/07 0915) BP: (96-175)/(60-108) 113/91 (12/07 0915) SpO2:  [81 %-100 %] 94 % (12/07 0915) Arterial Line BP: (80-240)/(46-89) 134/69 (12/07 0915) FiO2 (%):  [40 %] 40 % (12/07 0858) Weight:  [257 lb 15 oz (117 kg)] 257 lb 15 oz (117 kg) (12/07 0422) Last BM Date: 10/19/16  Weight change: Filed Weights   10/27/16 0500 10/28/16 0458 10/29/16 0422  Weight: 249 lb 9 oz (113.2 kg) 248 lb 3.8 oz (112.6 kg) 257 lb 15 oz (117 kg)     Intake/Output:   Intake/Output Summary (Last 24 hours) at 10/29/16 1000 Last data filed at 10/29/16 0918  Gross per 24 hour  Intake          6031.57 ml  Output             5405 ml  Net           626.57 ml     Physical Exam: CVP 18 General: Remains intubated. Off sedation but remains unresponsive.  HEENT: ETT in place. Neck: supple. JVP elevated to jaw. Carotids 2+ bilat; no bruits. No thyromegaly or nodule noted.  RIJ swan Cor: PMI nondisplaced. Irregular rhythm.  No M/G/R noted. Chest tubes in place.  Lungs: Mechanical breath sounds.  Rhonchorous.  Abdomen: soft, ND, no HSM. No bruits or masses. +BS  Extremities: no clubbing, rash. L radial aline. 2+ edema. Hands and Feet COLD with cyanotic changes. Fingers cyanotic. Feet mottled. Unable to doppler pedal pulses.  Neuro: Intubated. Off sedation but un-responsive so far.  GU: Foley in place  Telemetry: Reviewed, atrial fibrillation in 80s  Labs: CBC  Recent Labs  10/28/16 1458 10/29/16 0300  WBC 7.6 10.0  NEUTROABS 6.3 6.6  HGB 9.2* 10.6*  HCT 28.0* 32.0*  MCV 89.7 91.4  PLT 155 194   Basic Metabolic Panel  Recent Labs  10/27/16 0500  10/27/16 1701  10/28/16 0500 10/28/16  1920 10/29/16 0300  NA 142  < >  --   < > 147*  --  149*  K 3.9  < >  --   < > 3.5 3.2* 3.1*  CL 106  < >  --   < > 111  --  115*  CO2 26  --   --   < > 28  --  26  GLUCOSE 119*  < >  --   < > 149*  --  192*  BUN 38*  < >  --   < > 55*  --  59*  CREATININE 1.22  < > 1.61*  < > 1.42*  --  1.34*  CALCIUM 7.5*  --   --   < > 7.1*  --  6.9*  MG 2.5*  --  2.6*  --   --   --   --   < > = values in this interval not displayed. Liver Function Tests  Recent Labs  10/28/16 0500 10/29/16 0300  AST 599* 875*  ALT 528* 582*  ALKPHOS 163* 160*  BILITOT 1.3* 1.6*  PROT 4.6* 4.9*  ALBUMIN 2.0* 2.1*   No results for input(s): LIPASE, AMYLASE in the last 72 hours. Cardiac Enzymes No results for input(s): CKTOTAL, CKMB, CKMBINDEX, TROPONINI  in the last 72 hours.  BNP: BNP (last 3 results) No results for input(s): BNP in the last 8760 hours.  ProBNP (last 3 results) No results for input(s): PROBNP in the last 8760 hours.   D-Dimer No results for input(s): DDIMER in the last 72 hours. Hemoglobin A1C No results for input(s): HGBA1C in the last 72 hours. Fasting Lipid Panel No results for input(s): CHOL, HDL, LDLCALC, TRIG, CHOLHDL, LDLDIRECT in the last 72 hours. Thyroid Function Tests No results for input(s): TSH, T4TOTAL, T3FREE, THYROIDAB in the last 72 hours.  Invalid input(s): FREET3  Other results:  Imaging/Studies:  Dg Chest Port 1 View  Result Date: 10/29/2016 CLINICAL DATA:  Ileus.  Acute respiratory failure EXAM: PORTABLE CHEST 1 VIEW COMPARISON:  10/28/2016 FINDINGS: Support devices including bilateral chest tubes remain in place, unchanged. No pneumothorax. Cardiomegaly. Improving aeration and bibasilar atelectasis. IMPRESSION: Stable support devices.  No pneumothorax. Some improvement in aeration and decreasing bibasilar atelectasis. Electronically Signed   By: Rolm Baptise M.D.   On: 10/29/2016 08:01   Dg Chest Port 1 View  Result Date: 10/28/2016 CLINICAL DATA:  Intubated EXAM: PORTABLE CHEST 1 VIEW COMPARISON:  10/27/2016 FINDINGS: Cardiomediastinal silhouette is stable. Central vascular congestion and mild perihilar interstitial edema again noted. Persistent streaky bilateral basilar atelectasis or infiltrate. Endotracheal tube and NG tube is unchanged in position. Status post CABG. Bilateral chest tubes are again noted. Left subclavian PICC line is unchanged in position. There is no pneumothorax. IMPRESSION: Status post CABG. Stable support apparatus. Persistent mild congestion/pulmonary edema. Streaky bilateral basilar atelectasis or infiltrate. No pneumothorax. Electronically Signed   By: Lahoma Crocker M.D.   On: 10/28/2016 08:28   Dg Abd Portable 1v  Result Date: 10/29/2016 CLINICAL DATA:  Possible  ileus EXAM: PORTABLE ABDOMEN - 1 VIEW COMPARISON:  None. FINDINGS: Scattered large and small bowel gas is noted. No obstructive changes are seen. No free air is noted. No abnormal mass or abnormal calcifications are noted. No bony abnormality is seen. IMPRESSION: No acute abnormality noted. Electronically Signed   By: Inez Catalina M.D.   On: 10/29/2016 08:02    Medications:     Scheduled Medications: . sodium chloride  Intravenous Once  . anidulafungin  200 mg Intravenous Once   Followed by  . [START ON 10/30/2016] anidulafungin  100 mg Intravenous Q24H  . aspirin EC  325 mg Oral Daily   Or  . aspirin  324 mg Per Tube Daily  . bisacodyl  10 mg Oral Daily   Or  . bisacodyl  10 mg Rectal Daily  . chlorhexidine gluconate (MEDLINE KIT)  15 mL Mouth Rinse BID  . famotidine (PEPCID) IV  20 mg Intravenous Q12H  . feeding supplement (PRO-STAT SUGAR FREE 64)  30 mL Per Tube BID  . insulin aspart  0-24 Units Subcutaneous Q4H  . mouth rinse  15 mL Mouth Rinse QID  . meropenem (MERREM) IV  1 g Intravenous Q8H  . metolazone  2.5 mg Oral Once  . potassium chloride  40 mEq Per Tube Once  . potassium chloride (KCL MULTIRUN) 30 mEq in 265 mL IVPB  30 mEq Intravenous Once  . sodium chloride flush  3 mL Intravenous Q12H  . vancomycin  1,250 mg Intravenous Q24H    Infusions: . sodium chloride 20 mL/hr at 10/28/16 0400  . bivalirudin (ANGIOMAX) infusion 0.5 mg/mL (Non-ACS indications) 0.07 mg/kg/hr (10/29/16 0900)  . dexmedetomidine 0.801 mcg/kg/hr (10/29/16 0900)  . dextrose 75 mL/hr at 10/29/16 0919  . feeding supplement (VITAL HIGH PROTEIN) 1,000 mL (10/29/16 0900)  . fentaNYL infusion INTRAVENOUS 250 mcg/hr (10/29/16 0900)  . furosemide (LASIX) infusion 15 mg/hr (10/29/16 0900)  . milrinone 0.3 mcg/kg/min (10/29/16 0900)    PRN Medications: ipratropium-albuterol, metoprolol, midazolam, morphine injection, ondansetron (ZOFRAN) IV, sodium chloride flush   Assessment/Plan   Mr Karbowski is 69  year old admitted with CP. Complex operation this admission.  CABG x 3 + bioprosthetic AVR for severe bicuspid AS + ascending aorta replacement + MV repair for flail P2.  MV repair complicated by severe SAM, ended up having to remove posterior annuloplasty band.  Chest closed on 12/3.   1. CAD:  10/22/16 S/P CABG x3 with chest left open.  Chest successfully closed am of 10/26/16. - Continue ASA, start statin when LFTs come down.  2. Severe Aortic Stenosis: 11/302017 S/P AVR bioprostheic  3. Mitral regurgitation: Partial flail leaflet with MV repair.  Developed SAM initially after repair and posterior annuloplasty ring had to be removed.  SAM resolved. 4. Acute on chronic systolic CHF:  TEE at time of chest closure showed EF 40-45%, small cavity size, flattened/hypokinetic septum, s/p MV repair with mild MR, mild to moderately decreased RV systolic function, thrombus in RA. Co-ox 56% today with CVP 18.  Good UOP but still not net negative significantly due to amount of fluid in.  - Continue Lasix 15 mg/hr, will give dose of metolazone 2.5 x 1.  - Replace K, recheck BMET in pm.   - Continue milrinone 0.3 mcg/kg/min for now.  - Off norepinephrine.  Pressures stable currently.  5. ID: ?Source. Tm 104, cultures negative so far.  WBCs 10 today.   - Broad spectrum coverage with meropenem and vancomycin, ?add anti-fungal.  - ID following.  6. Elevated LFTs: Most likely shock liver.  LFTs rising.   - I will stop amiodarone for now, keep off as long as HR does not get out of control.   - No statin until LFTs come down.  - Abdominal US today, would do doppler study to look for portal vein thrombosis.  7. Heme: HIT negative, suspect DIC.  This has recovered.  - On bivalirudin for RA  thrombus and atrial fibrillation.  - Will need transition to warfarin eventually.  8. VDRF: CCM following for vent management. Pulmonary edema present, ?PNA.  9. Atrial fibrillation: Paroxysmal post-op. He is in and out of  atrial fibrillation.  Rate is controlled.   - Continue anticoagulation => right now on bivalirudin.    - As above, stopping amiodarone for now with continued rise in LFTs.  10. PAD: Unable to doppler pulses today, will get peripheral arterial doppler evaluation.   35 minutes critical care time.   Loralie Champagne 10/29/2016 10:00 AM

## 2016-10-29 NOTE — Progress Notes (Signed)
Hematology: Counts holding. Blood film review: no schistocytes so I am confident DIC process has resolved but will still need to watch closely re release of thromboplastic material from ischemic digits. Very toxic appearing smear from 12/6 with signs of intense marrow stress: toxic granulations in neutrophils, polychromasia (increased reticulocytes) & numerous nucleated red cells: findings compatible with sepsis. Now on Vanco & Meropenem.  Impression: #1. Transient DIC - resolved #2. Probable Sepsis- being appropriately covered with broad spectrum antibiotics   Murriel Hopper, MD, [place]  Hematology-Oncology/Internal Medicine [phone]

## 2016-10-29 NOTE — Progress Notes (Addendum)
INFECTIOUS DISEASE PROGRESS NOTE  ID: Scott [name] is a 70 y.o. male with  Principal Problem:   S/P aortic valve replacement + mitral valve repair + CABG x3 + repair of ascending thoracic aortic aneurysm Active Problems:   Severe aortic stenosis by prior echocardiography   Unstable angina (HCC)   Severe aortic stenosis   Coronary artery disease involving native coronary artery of native heart with unstable angina pectoris ([place])   Bicuspid aortic valve   Thoracic ascending aortic aneurysm (HCC)   Mitral regurgitation due to cusp prolapse   S/P ascending aortic aneurysm repair   S/P mitral valve repair   S/P CABG x 3   Cardiogenic shock (HCC)   Postoperative atrial fibrillation (HCC)   Thrombocytopenia ([place])   Acute combined systolic and diastolic congestive heart failure ([place])   Acute respiratory failure ([place])   DIC (disseminated intravascular coagulation) ([place])  Subjective: No response  Abtx:  Anti-infectives    Start     Dose/Rate Route Frequency Ordered Stop   10/30/16 0927  anidulafungin (ERAXIS) 100 mg in sodium chloride 0.9 % 100 mL IVPB     100 mg over 90 Minutes Intravenous Every 24 hours 10/29/16 0927     10/29/16 1000  meropenem (MERREM) 1 g in sodium chloride 0.9 % 100 mL IVPB     1 g 200 mL/hr over 30 Minutes Intravenous Every 8 hours 10/29/16 0931     10/29/16 0930  anidulafungin (ERAXIS) 200 mg in sodium chloride 0.9 % 200 mL IVPB     200 mg over 180 Minutes Intravenous  Once 10/29/16 0927     10/29/16 0600  vancomycin (VANCOCIN) 1,250 mg in sodium chloride 0.9 % 250 mL IVPB     1,250 mg 166.7 mL/hr over 90 Minutes Intravenous Every 24 hours 10/28/16 0900     10/28/16 0600  vancomycin (VANCOCIN) IVPB 750 mg/150 ml premix  Status:  Discontinued     750 mg 150 mL/hr over 60 Minutes Intravenous Every 12 hours 10/27/16 1226 10/28/16 0900   10/27/16 1400  cefTRIAXone (ROCEPHIN) 2 g in dextrose 5 % 50 mL IVPB  Status:  Discontinued     2 g 100 mL/hr over  30 Minutes Intravenous Every 24 hours 10/27/16 1203 10/29/16 0907   10/27/16 1400  rifampin (RIFADIN) 300 mg in sodium chloride 0.9 % 100 mL IVPB  Status:  Discontinued     300 mg 200 mL/hr over 30 Minutes Intravenous Every 8 hours 10/27/16 1226 10/27/16 1426   10/27/16 1330  vancomycin (VANCOCIN) 2,000 mg in sodium chloride 0.9 % 500 mL IVPB     2,000 mg 250 mL/hr over 120 Minutes Intravenous  Once 10/27/16 1226 10/27/16 1451   10/27/16 1000  levofloxacin (LEVAQUIN) IVPB 750 mg  Status:  Discontinued     750 mg 100 mL/hr over 90 Minutes Intravenous Every 24 hours 10/26/16 1054 10/27/16 1203   10/26/16 1815  vancomycin (VANCOCIN) IVPB 1000 mg/200 mL premix     1,000 mg 200 mL/hr over 60 Minutes Intravenous  Once 10/26/16 1054 10/26/16 1752   10/26/16 0954  polymyxin B 500,000 Units, bacitracin 50,000 Units in sodium chloride irrigation 0.9 % 500 mL irrigation  Status:  Discontinued       As needed 10/26/16 0954 10/26/16 1030   10/26/16 0400  vancomycin (VANCOCIN) 1,500 mg in sodium chloride 0.9 % 250 mL IVPB     1,500 mg 125 mL/hr over 120 Minutes Intravenous To Surgery 10/25/16 1145 10/26/16 0925  10/26/16 0400  vancomycin (VANCOCIN) 1,000 mg in sodium chloride 0.9 % 1,000 mL irrigation      Irrigation To Surgery 10/25/16 1145 10/26/16 0953   10/26/16 0400  levofloxacin (LEVAQUIN) IVPB 500 mg     500 mg 100 mL/hr over 60 Minutes Intravenous To Surgery 10/25/16 1145 10/26/16 0925   10/23/16 1000  levofloxacin (LEVAQUIN) IVPB 750 mg     750 mg 100 mL/hr over 90 Minutes Intravenous Every 24 hours 10/22/16 2130 10/23/16 1047   10/23/16 0515  vancomycin (VANCOCIN) IVPB 1000 mg/200 mL premix     1,000 mg 200 mL/hr over 60 Minutes Intravenous  Once 10/22/16 2130 10/23/16 0600   10/22/16 2045  vancomycin (VANCOCIN) 1,250 mg in sodium chloride 0.9 % 250 mL IVPB  Status:  Discontinued     1,250 mg 166.7 mL/hr over 90 Minutes Intravenous To Surgery 10/22/16 2043 10/22/16 2130   10/22/16 2045   levofloxacin (LEVAQUIN) IVPB 500 mg  Status:  Discontinued     500 mg 100 mL/hr over 60 Minutes Intravenous To Surgery 10/22/16 2043 10/22/16 2130   10/22/16 0400  vancomycin (VANCOCIN) 1,250 mg in sodium chloride 0.9 % 250 mL IVPB     1,250 mg 166.7 mL/hr over 90 Minutes Intravenous To Surgery 10/21/16 1305 10/22/16 2247   10/22/16 0400  vancomycin (VANCOCIN) 1,000 mg in sodium chloride 0.9 % 1,000 mL irrigation      Irrigation To Surgery 10/21/16 1305 10/22/16 0819   10/22/16 0400  levofloxacin (LEVAQUIN) IVPB 500 mg     500 mg 100 mL/hr over 60 Minutes Intravenous To Surgery 10/21/16 1305 10/22/16 2114      Medications:  Scheduled: . sodium chloride   Intravenous Once  . [START ON 10/30/2016] anidulafungin  100 mg Intravenous Q24H  . aspirin EC  325 mg Oral Daily   Or  . aspirin  324 mg Per Tube Daily  . bisacodyl  10 mg Oral Daily   Or  . bisacodyl  10 mg Rectal Daily  . chlorhexidine gluconate (MEDLINE KIT)  15 mL Mouth Rinse BID  . famotidine (PEPCID) IV  20 mg Intravenous Q12H  . feeding supplement (PRO-STAT SUGAR FREE 64)  30 mL Per Tube BID  . insulin aspart  0-24 Units Subcutaneous Q4H  . mouth rinse  15 mL Mouth Rinse QID  . meropenem (MERREM) IV  1 g Intravenous Q8H  . sodium chloride flush  3 mL Intravenous Q12H  . vancomycin  1,250 mg Intravenous Q24H    Objective: Vital signs in last 24 hours: Temp:  [97.5 F (36.4 C)-104 F (40 C)] 98.1 F (36.7 C) (12/07 1045) Pulse Rate:  [34-112] 90 (12/07 1045) Resp:  [15-40] 20 (12/07 1045) BP: (88-159)/(59-108) 88/59 (12/07 1045) SpO2:  [81 %-100 %] 99 % (12/07 1045) Arterial Line BP: (80-240)/(46-90) 168/86 (12/07 1015) FiO2 (%):  [40 %] 40 % (12/07 0858) Weight:  [117 kg (257 lb 15 oz)] 117 kg (257 lb 15 oz) (12/07 0422)   General appearance: no distress Resp: rhonchi anterior - bilateral Chest wall: visible sternotomy wound clean.  Cardio: regular rate and rhythm GI: abnormal findings:  distended and  hypoactive bowel sounds Extremities: toes blue. LUE PIC is clean.   Lab Results  Recent Labs  10/28/16 0500 10/28/16 1458 10/28/16 1920 10/29/16 0300  WBC 8.3 7.6  --  10.0  HGB 8.1* 9.2*  --  10.6*  HCT 25.1* 28.0*  --  32.0*  NA 147*  --   --  149*  K 3.5  --  3.2* 3.1*  CL 111  --   --  115*  CO2 28  --   --  26  BUN 55*  --   --  59*  CREATININE 1.42*  --   --  1.34*   Liver Panel  Recent Labs  10/26/16 1214 10/27/16 0500 10/28/16 0500 10/29/16 0300  PROT 4.6* 4.8* 4.6* 4.9*  ALBUMIN 2.3* 2.1* 2.0* 2.1*  AST 229* 319* 599* 875*  ALT 540* 451* 528* 582*  ALKPHOS 62 155* 163* 160*  BILITOT 0.7 1.6* 1.3* 1.6*  BILIDIR 0.3 0.7*  --   --   IBILI 0.4 0.9  --   --    Sedimentation Rate No results for input(s): ESRSEDRATE in the last 72 hours. C-Reactive Protein No results for input(s): CRP in the last 72 hours.  Microbiology: Recent Results (from the past 240 hour(s))  Surgical pcr screen     Status: None   Collection Time: 10/21/16  4:18 AM  Result Value Ref Range Status   MRSA, PCR NEGATIVE NEGATIVE Final   Staphylococcus aureus NEGATIVE NEGATIVE Final    Comment:        The Xpert SA Assay (FDA approved for NASAL specimens in patients over 49 years of age), is one component of a comprehensive surveillance program.  Test performance has been validated by [facility] for patients greater than or equal to 91 year old. It is not intended to diagnose infection nor to guide or monitor treatment.   Culture, blood (Routine X 2) w Reflex to ID Panel     Status: None   Collection Time: 10/23/16  3:18 PM  Result Value Ref Range Status   Specimen Description BLOOD RIGHT HAND  Final   Special Requests IN PEDIATRIC BOTTLE 1CC  Final   Culture NO GROWTH 6 DAYS  Final   Report Status 10/29/2016 FINAL  Final  Culture, blood (Routine X 2) w Reflex to ID Panel     Status: None   Collection Time: 10/23/16  3:23 PM  Result Value Ref Range Status   Specimen  Description BLOOD RIGHT ANTECUBITAL  Final   Special Requests IN PEDIATRIC BOTTLE 3CC  Final   Culture NO GROWTH 6 DAYS  Final   Report Status 10/29/2016 FINAL  Final  Culture, blood (routine x 2)     Status: None (Preliminary result)   Collection Time: 10/26/16 12:45 PM  Result Value Ref Range Status   Specimen Description BLOOD RIGHT ANTECUBITAL  Final   Special Requests IN PEDIATRIC BOTTLE 1.5CC  Final   Culture NO GROWTH 3 DAYS  Final   Report Status PENDING  Incomplete  Culture, blood (routine x 2)     Status: None (Preliminary result)   Collection Time: 10/26/16 12:50 PM  Result Value Ref Range Status   Specimen Description BLOOD RIGHT HAND  Final   Special Requests IN PEDIATRIC BOTTLE 3CC  Final   Culture NO GROWTH 3 DAYS  Final   Report Status PENDING  Incomplete  Culture, blood (Routine X 2) w Reflex to ID Panel     Status: None (Preliminary result)   Collection Time: 10/29/16  5:51 AM  Result Value Ref Range Status   Specimen Description BLOOD RIGHT ARM  Final   Special Requests BOTTLES DRAWN AEROBIC AND ANAEROBIC 5CC EA  Final   Culture PENDING  Incomplete   Report Status PENDING  Incomplete    Studies/Results: Dg Chest Port 1 View  Result  Date: 10/29/2016 CLINICAL DATA:  Ileus.  Acute respiratory failure EXAM: PORTABLE CHEST 1 VIEW COMPARISON:  10/28/2016 FINDINGS: Support devices including bilateral chest tubes remain in place, unchanged. No pneumothorax. Cardiomegaly. Improving aeration and bibasilar atelectasis. IMPRESSION: Stable support devices.  No pneumothorax. Some improvement in aeration and decreasing bibasilar atelectasis. Electronically Signed   By: Rolm Baptise M.D.   On: 10/29/2016 08:01   Dg Chest Port 1 View  Result Date: 10/28/2016 CLINICAL DATA:  Intubated EXAM: PORTABLE CHEST 1 VIEW COMPARISON:  10/27/2016 FINDINGS: Cardiomediastinal silhouette is stable. Central vascular congestion and mild perihilar interstitial edema again noted. Persistent streaky  bilateral basilar atelectasis or infiltrate. Endotracheal tube and NG tube is unchanged in position. Status post CABG. Bilateral chest tubes are again noted. Left subclavian PICC line is unchanged in position. There is no pneumothorax. IMPRESSION: Status post CABG. Stable support apparatus. Persistent mild congestion/pulmonary edema. Streaky bilateral basilar atelectasis or infiltrate. No pneumothorax. Electronically Signed   By: Lahoma Crocker M.D.   On: 10/28/2016 08:28   Dg Abd Portable 1v  Result Date: 10/29/2016 CLINICAL DATA:  Possible ileus EXAM: PORTABLE ABDOMEN - 1 VIEW COMPARISON:  None. FINDINGS: Scattered large and small bowel gas is noted. No obstructive changes are seen. No free air is noted. No abnormal mass or abnormal calcifications are noted. No bony abnormality is seen. IMPRESSION: No acute abnormality noted. Electronically Signed   By: Inez Catalina M.D.   On: 10/29/2016 08:02     Assessment/Plan: Post cardiotomy shock -improving, off pressors today  CABG, MVR, AVR, aneurysm repair  Fever post cardiac surgery Vegetation vs Clot in RA Thrombocytopenia- resolved. DIC Anemia post op, stable.   Persistently elevated LFT- worse today Continued fever overnight.  Will change ceftriaxone to merrem Will add capso.  CXR better, abd film no ileus, WBC stable/normal. No DVT on doppler in UE.  Suspect ischemic extremities behind fever  Consider imaging liver amio d/c by CV  Total days of antibiotics: 2 vanco/ceftriaxone         Bobby Rumpf Infectious Diseases (pager) [phone] [email or web address] 10/29/2016, 11:11 AM  LOS: 9 days

## 2016-10-29 NOTE — Progress Notes (Signed)
Spoke with Dr. Roxy Manns regarding K of 2.9, gave verbal order for two bags of K-total 60 mEqs and 40 mEqs every 8 hours down tube.  Rowe Pavy, RN

## 2016-10-29 NOTE — Progress Notes (Signed)
[place] for Bivalirudin Indication: R/O clot in R-atrium and R/O HIT  Allergies  Allergen Reactions  . Doxycycline Hives  . Penicillins Hives     Has patient had a PCN reaction causing immediate rash, facial/tongue/throat swelling, SOB or lightheadedness with hypotension: No Has patient had a PCN reaction causing severe rash involving mucus membranes or skin necrosis: No Has patient had a PCN reaction that required hospitalization: No Has patient had a PCN reaction occurring within the last 10 years: Yes If all of the above answers are "NO", then may proceed with Cephalosporin use.   . Sulfa Antibiotics Hives  . Heparin Other (See Comments)    Possible HIT    Patient Measurements: Height: 5\' 9"  (175.3 cm) Weight: 257 lb 15 oz (117 kg) (cooling blanket now on bed) IBW/kg (Calculated) : 70.7  Vital Signs: Temp: 99 F (37.2 C) (12/07 0745) Temp Source: Oral (12/07 0700) BP: 111/70 (12/07 0700) Pulse Rate: 48 (12/07 0745)  Labs:  Recent Labs  10/26/16 1124  10/27/16 0500  10/27/16 1940 10/28/16 0500 10/28/16 1458 10/29/16 0300  HGB 9.1*  < > 8.3*  < >  --  8.1* 9.2* 10.6*  HCT 27.4*  < > 25.4*  < >  --  25.1* 28.0* 32.0*  PLT 98*  < > 128*  < >  --  155 155 180  APTT 42*  < > 86*  --   --  80*  --  65*  LABPROT 22.2*  --   --   --   --  29.5*  --  28.6*  INR 1.91  --   --   --   --  2.73  --  2.63  CREATININE  --   < > 1.22  < > 1.58* 1.42*  --  1.34*  < > = values in this interval not displayed.  Estimated Creatinine Clearance: 64.7 mL/min (by C-G formula based on SCr of 1.34 mg/dL (H)).    Assessment: 70yo male s/p sternal washout POD#3 with possible R-atrial clot HIT, on bivalirudin. -aPTT= 65 = within goal on 0.07 mg/kg/hr of bivalirudin , Hg 10.6 after 2 U blood yesterday, pltc back to WNL at 180. Marland Kitchen No active bleeding reported.   With a low heparin antibody and 4Ts score of 4. Could consider HIT ruled out. Discussed  with Dr. Roxy Manns on 12/6 and plan is to continue bivalirudin and to confirm testing with an SRA  SRA drawn 12/6 is in process.  Goal of Therapy:  APTT 50-85sec Monitor platelets by anticoagulation protocol: Yes    Plan - continue bivalirudin at  0.07 mg/kg/hr -aPTT and CBC daily - f/u pending [place], Pharm.D. BP:[id number] 10/29/2016 8:32 AM

## 2016-10-29 NOTE — Progress Notes (Addendum)
CRITICAL VALUE ALERT  Critical value received:  Lactic of 2.0  Date of notification:  10/29/16  Time of notification:  1110  Critical value read back:Yes.    Nurse who received alert:  Jake Bathe, RN  MD notified (1st page):  Titus Mould  Time of first page:  1110  MD notified (2nd page):  Time of second page:  Responding MD:  Titus Mould  Time MD responded:  [id number], no new orders.

## 2016-10-29 NOTE — Progress Notes (Addendum)
Pharmacy Antibiotic Note  Scott [name] is a 70 y.o. male admitted on 10/20/2016.  Pharmacy  consulted for vancomycin/rifampin/ceftriaxone dosing for endocarditis on 12/5. Rifampin dc'd after 1 dose on 12/5 due to increased LFT. Now to stop Ceftriaxone and start meropenem and anidulafungin for fevers. Fever 104, extremities cyanotic, CXR better per CCM unlikely HCAP.  Creat 1.34 - normalized creat cl 52 ml/min. WBC 10.    Plan:  Vancomycin 1250 mg IV q24 Meropenem 1 gm IV q8h anidulafungin 200 mg IV x 1 then 100 mg IV q24 F/u renal fxn, wbc, temp, culture data Vancomycin level as needed  Height: 5\' 9"  (175.3 cm) Weight: 257 lb 15 oz (117 kg) (cooling blanket now on bed) IBW/kg (Calculated) : 70.7  Temp (24hrs), Avg:101 F (38.3 C), Min:97.9 F (36.6 C), Max:104 F (40 C)   Recent Labs Lab 10/27/16 0500 10/27/16 1656 10/27/16 1701 10/27/16 1940 10/28/16 0500 10/28/16 1458 10/29/16 0300  WBC 8.6  --  11.6*  --  8.3 7.6 10.0  CREATININE 1.22 1.70* 1.61* 1.58* 1.42*  --  1.34*    Estimated Creatinine Clearance: 64.7 mL/min (by C-G formula based on SCr of 1.34 mg/dL (H)).    Allergies  Allergen Reactions  . Doxycycline Hives  . Penicillins Hives     Has patient had a PCN reaction causing immediate rash, facial/tongue/throat swelling, SOB or lightheadedness with hypotension: No Has patient had a PCN reaction causing severe rash involving mucus membranes or skin necrosis: No Has patient had a PCN reaction that required hospitalization: No Has patient had a PCN reaction occurring within the last 10 years: Yes If all of the above answers are "NO", then may proceed with Cephalosporin use.   . Sulfa Antibiotics Hives  . Heparin Other (See Comments)    Possible HIT  Antimicrobials this admission:  Vanc 12/5>> Rifampin 12/5 x 1 dose - [place] dc'd 2nd incr LFTs Lvq 11/30>>12/4 CTX 12/4>>12/7 Merrem 12/7 >> Eraxis 127>>  Dose adjustments this admission:  12/6 Vanc dose  changed 12/6 due to renal fxn  Microbiology results:  12/1 BCx: ngtd 11/29 MRSA PCR: neg 12/4 BC x2 perip 12/4 BC x2 ngtd 12/7 BCx2  Eudelia Bunch, Pharm.D. QP:[id number] 10/29/2016 9:37 AM

## 2016-10-29 NOTE — Progress Notes (Addendum)
PULMONARY / CRITICAL CARE MEDICINE   Name: Scott [name] MRN: [id number] DOB: 12-03-1945    ADMISSION DATE:  10/20/2016 CONSULTATION DATE: 12/1  REFERRING MD: Ricard Dillon  CHIEF COMPLAINT:  CAD  HISTORY OF PRESENT ILLNESS:   70 yo former smoker with known CAD and ascending aorta aneurysm, who was taken to OR 11/30 for cabg x 3, aortic root replacement, MVR replacement, thoracic cavity left open and had extended pump time in OR. On APRV ventilation post op, CI 1.6 and multiple pressors. PCCM asked to manage vent.  SUBJECTIVE:  Fever 104 Weaning this am  Neg 144 cc  VITAL SIGNS: BP (!) 157/76   Pulse (!) 105   Temp 98.2 F (36.8 C)   Resp 15   Ht '5\' 9"'  (1.753 m)   Wt 117 kg (257 lb 15 oz) Comment: cooling blanket now on bed  SpO2 97%   BMI 38.09 kg/m   HEMODYNAMICS: CVP:  [12 mmHg-39 mmHg] 18 mmHg  VENTILATOR SETTINGS: Vent Mode: PRVC FiO2 (%):  [40 %] 40 % Set Rate:  [12 bmp] 12 bmp Vt Set:  [570 mL] 570 mL PEEP:  [5 cmH20] 5 cmH20 Plateau Pressure:  [15 cmH20-20 cmH20] 15 cmH20  INTAKE / OUTPUT: I/O last 3 completed shifts: In: 7133.9 [I.V.:4433; Blood:648; NG/GT:907.9; IV Piggyback:1145] Out: 0867 [YPPJK:9326; Emesis/NG output:200; Chest Tube:1040]  PHYSICAL EXAMINATION: General: sedated on vent, fc Neuro: rass -1 HEENT: jvd remains, ett wnl Cardiovascular: s1 s2 irr, more controlled Lungs:  Mild ronchi bilateral Abdomen:  Soft , BS low, no r Musculoskeletal:  Intact, equal cold mid foot, increased livido anterior rt I did possibly get a signal rt PT Skin: cool dry  LABS:  BMET  Recent Labs Lab 10/27/16 1940 10/28/16 0500 10/28/16 1920 10/29/16 0300  NA 146* 147*  --  149*  K 3.9 3.5 3.2* 3.1*  CL 111 111  --  115*  CO2 21* 28  --  26  BUN 52* 55*  --  59*  CREATININE 1.58* 1.42*  --  1.34*  GLUCOSE 133* 149*  --  192*    Electrolytes  Recent Labs Lab 10/25/16 0500 10/26/16 0400 10/26/16 1635 10/27/16 0500 10/27/16 1701  10/27/16 1940 10/28/16 0500 10/29/16 0300  CALCIUM 7.8* 7.8* 7.6* 7.5*  --  7.2* 7.1* 6.9*  MG 1.7 1.8 2.6* 2.5* 2.6*  --   --   --   PHOS 1.9* 1.7*  --   --   --   --   --   --     CBC  Recent Labs Lab 10/28/16 0500 10/28/16 1458 10/29/16 0300  WBC 8.3 7.6 10.0  HGB 8.1* 9.2* 10.6*  HCT 25.1* 28.0* 32.0*  PLT 155 155 180    Coag's  Recent Labs Lab 10/26/16 1124  10/27/16 0500 10/28/16 0500 10/29/16 0300  APTT 42*  < > 86* 80* 65*  INR 1.91  --   --  2.73 2.63  < > = values in this interval not displayed.  Sepsis Markers  Recent Labs Lab 10/27/16 0900 10/28/16 0500  PROCALCITON 4.56 4.33    ABG  Recent Labs Lab 10/28/16 0500 10/28/16 2305 10/29/16 0417  PHART 7.468* 7.514* 7.475*  PCO2ART 39.0 33.4 36.8  PO2ART 97.0 90.0 90.0    Liver Enzymes  Recent Labs Lab 10/27/16 0500 10/28/16 0500 10/29/16 0300  AST 319* 599* 875*  ALT 451* 528* 582*  ALKPHOS 155* 163* 160*  BILITOT 1.6* 1.3* 1.6*  ALBUMIN 2.1* 2.0* 2.1*  Cardiac Enzymes No results for input(s): TROPONINI, PROBNP in the last 168 hours.  Glucose  Recent Labs Lab 10/28/16 0755 10/28/16 1129 10/28/16 1533 10/28/16 1938 10/28/16 2305 10/29/16 0413  GLUCAP 148* 148* 180* 180* 197* 175*    Imaging Dg Chest Port 1 View  Result Date: 10/29/2016 CLINICAL DATA:  Ileus.  Acute respiratory failure EXAM: PORTABLE CHEST 1 VIEW COMPARISON:  10/28/2016 FINDINGS: Support devices including bilateral chest tubes remain in place, unchanged. No pneumothorax. Cardiomegaly. Improving aeration and bibasilar atelectasis. IMPRESSION: Stable support devices.  No pneumothorax. Some improvement in aeration and decreasing bibasilar atelectasis. Electronically Signed   By: Rolm Baptise M.D.   On: 10/29/2016 08:01   Dg Abd Portable 1v  Result Date: 10/29/2016 CLINICAL DATA:  Possible ileus EXAM: PORTABLE ABDOMEN - 1 VIEW COMPARISON:  None. FINDINGS: Scattered large and small bowel gas is noted. No  obstructive changes are seen. No free air is noted. No abnormal mass or abnormal calcifications are noted. No bony abnormality is seen. IMPRESSION: No acute abnormality noted. Electronically Signed   By: Inez Catalina M.D.   On: 10/29/2016 08:02     STUDIES:    CULTURES: 12-1 bc >>> 12/4 BC x 4>>> 12/6 BC>>>  ANTIBIOTICS: 11/30 levaquin>> 12/3 ceftriaxone>>>12/27 12/3 vanc>>> 12/7 meropenem>>>  SIGNIFICANT EVENTS: 11/30 surgery, hypoxia 12/4 closed chest 12/7-high fevers  LINES/TUBES: 11.30 OTT>> 11/30 rt I j PA cath>>12/4 picc left 12/4>>> 11/30 l rad aline>>12/7 11/30 CT x 4>> 11/30 wound vac>>12/4   ASSESSMENT / PLAN:  PULMONARY A: Acute hypoxic resp failure ALI? OHS Former smoker Chest closed 12/4 Presumed PE ( Rt atrial clot) ALI Pulm edema P:   pcxr improved Weaning cpap 5 ps 5, goal 2 hours, unlikely to extubate with fever 104 and lower ext ischemia concerns pcxr in am  Neg balance goals remain to even abg noted, alk from rate on own, no changes to TV   CARDIOVASCULAR A:  Post cabg x 3, MVR, ascending aortic root repalcement Hypotension resolved Fib CAD Rt atrial clot Rt lower ext ischemia likely ( exam equal however, I think I got a signal doppler myself) P:  Amio, may need to limit if LFt rise - cosnider dc, will d/w cards Anticoagulation maintain Dc aline with fever 104 Echo for RV Get art survey  RENAL Lab Results  Component Value Date   CREATININE 1.34 (H) 10/29/2016   CREATININE 1.42 (H) 10/28/2016   CREATININE 1.58 (H) 10/27/2016   CREATININE 0.87 10/19/2016    A:   HypoK mild Hypernatremia P:   Chem in am Lasix drip, may need metal ozone K supp Increase d5w  GASTROINTESTINAL A:   Hx of Gerd Shock liver R/o contribution LFt from lower ext ischemia LFT rise, r/o portal vein thrombosis P:   PPI LFT in am  Cpk, lactic, ldh for ext ischemia concerns Tolerated TF but would NOT increase  Keep low peptid  formulation likely if portal vein was thrombosed would be higher LFT, will assess [place] abdo with duplex  HEMATOLOGIC  Recent Labs  10/28/16 1458 10/29/16 0300  HGB 9.2* 10.6*    A:   Thrombocytopenia - agree with heme, DIC likely was cause ELISA neg 12/4 RA clot Digital ischemia P:  Amgiomax, per pharmacy- elisa neg, await SRA HITT pending, SRA to follow scd Duplex legs done with RA clot - neg Deep , doppler arms 12/6-  Neg Follow finger and toes for demargination  INFECTIOUS A:   R/o endocarditis Fever 104 12/6 (lower  ext ischemia?) At risk endovascular infection Weaning well, pcxr better- unlikely HCAP P:   Ceftriaxone dc Add meropenem continued vanc Will d/w ID any role antifungal empiric- start eraxis ( no sig dose adjust for lft) BC sent Sputum sent Echo repeat Dc aline ua  ENDOCRINE CBG (last 3)   Recent Labs  10/28/16 1938 10/28/16 2305 10/29/16 0413  GLUCAP 180* 197* 175*     A:   DM-controlled P:   SSI  NEUROLOGIC A:   Post OHS with prolonged pump time 11/30 Awake and follows commands 12/1 Encephalopathy  / oversedation- improved P:   RASS goal: 0 WUA fent to off  FAMILY  - Updates: I updated daughter at bedside 12/5, 12/6, 12/7  - Inter-disciplinary family meet or Palliative Care meeting due by: 12/8   Critical care time 35 min  Lavon Paganini. Titus Mould, MD, [place] Pgr: [facility]

## 2016-10-29 NOTE — Progress Notes (Signed)
Pt placed back on full vent support due to increased WOB, RR, and decreased sat - mid 70's.  Pt's sat recovered to 94%.  RN aware.

## 2016-10-29 NOTE — Progress Notes (Signed)
Patient ID: Scott [name], male   DOB: 1946/07/26, 70 y.o.   MRN: [id number]  SICU Evening Rounds:  BP has trended down today off levophed. Remains on milrinone.   Rhythm is atrial fib 110-130's this evening but having US done now which may be agitating him. Amio stopped due to liver dysfunction.   Diuresing well on lasix drip. Repleting K+.  Chest tube output low.  Temp 101.8 this pm.  BMET    Component Value Date/Time   NA 148 (H) 10/29/2016 1600   K 2.9 (L) 10/29/2016 1600   CL 113 (H) 10/29/2016 1600   CO2 28 10/29/2016 1600   GLUCOSE 165 (H) 10/29/2016 1600   BUN 55 (H) 10/29/2016 1600   CREATININE 1.01 10/29/2016 1600   CREATININE 0.87 10/19/2016 1123   CALCIUM 6.4 (LL) 10/29/2016 1600   GFRNONAA >60 10/29/2016 1600   GFRAA >60 10/29/2016 1600

## 2016-10-29 NOTE — Care Management Note (Signed)
Case Management Note  Patient Details  Name: Scott [name] MRN: [id number] Date of Birth: [birth year]  Subjective/Objective:   S/p heart cath, pta indep, lives with wife.  NCM will cont to follow for dc needs.                  Action/Plan:   Expected Discharge Date:                  Expected Discharge Plan:  Home/Self Care  In-House Referral:     Discharge planning Services  CM Consult  Post Acute Care Choice:    Choice offered to:     DME Arranged:    DME Agency:     HH Arranged:    HH Agency:     Status of Service:  Completed, signed off  If discussed at [employer] of Stay Meetings, dates discussed:    Additional Comments: 10/29/2016 Discussed in LOS 10/29/16:  Pt remains appropriate for continued stay.  3 days s/p sternal washout and closure.  Remains on milrinone, lasix drip Maryclare Labrador, RN 10/29/2016, 11:03 AM

## 2016-10-29 NOTE — Progress Notes (Addendum)
[address]       [place] [address]             [phone]        CARDIOTHORACIC SURGERY PROGRESS NOTE   R3 Days Post-Op Procedure(s) (LRB): STERNAL WASHOUT AND DELAYED PRIMARY CLOSURE (N/A) TRANSESOPHAGEAL ECHOCARDIOGRAM (TEE) (N/A)  Subjective: Sedated on vent.  Has followed some simple commands  Objective: Vital signs: BP Readings from Last 1 Encounters:  10/29/16 (!) 129/104   Pulse Readings from Last 1 Encounters:  10/29/16 82   Resp Readings from Last 1 Encounters:  10/29/16 (!) 22   Temp Readings from Last 1 Encounters:  10/29/16 99.7 F (37.6 C)    Hemodynamics: CVP:  [12 mmHg-39 mmHg] 12 mmHg  Mixed venous co-ox 56.4%   Physical Exam:  Rhythm:   In and out of rate-controlled Afib, currently sinus  Breath sounds: Coarse but clear  Heart sounds:  RRR  Incisions:  Dressings dry, intact  Abdomen:  Soft, moderately-distended, + BS's, haven't been checking G-tube residuals  Extremities:  Dusky fingers and feet, increased mottling right foot, DP pulse difficult to Doppler this morning   Intake/Output from previous day: 12/06 0701 - 12/07 0700 In: 5120.7 [I.V.:2974.8; GM:[id number]; NG/GT:817.9; IV Piggyback:680] Out: BN:[id number]; Emesis/NG output:50; Chest Tube:810] Intake/Output this shift: No intake/output data recorded.  Lab Results:  CBC: Recent Labs  10/28/16 1458 10/29/16 0300  WBC 7.6 10.0  HGB 9.2* 10.6*  HCT 28.0* 32.0*  PLT 155 180    BMET:  Recent Labs  10/28/16 0500 10/28/16 1920 10/29/16 0300  NA 147*  --  149*  K 3.5 3.2* 3.1*  CL 111  --  115*  CO2 28  --  26  GLUCOSE 149*  --  192*  BUN 55*  --  59*  CREATININE 1.42*  --  1.34*  CALCIUM 7.1*  --  6.9*     PT/INR:   Recent Labs  10/29/16 0300  LABPROT 28.6*  INR 2.63    CBG (last 3)   Recent Labs  10/28/16 1938 10/28/16 2305 10/29/16 0413  GLUCAP 180* 197* 175*    ABG    Component Value Date/Time   PHART 7.475 (H)  10/29/2016 0417   PCO2ART 36.8 10/29/2016 0417   PO2ART 90.0 10/29/2016 0417   HCO3 26.6 10/29/2016 0417   TCO2 28 10/29/2016 0417   ACIDBASEDEF 1.0 10/27/2016 1125   O2SAT 97.0 10/29/2016 0417    CXR: Looks remarkably clear  KUB:   benign appearing bowel gas pattern  Assessment/Plan: S/P Procedure(s) (LRB): STERNAL WASHOUT AND DELAYED PRIMARY CLOSURE (N/A) TRANSESOPHAGEAL ECHOCARDIOGRAM (TEE) (N/A)  Remains stable although fever elevated overnight In and out of rate-controlled Afib w/ stable BP off all pressors CVP decreased somewhat and mixed venous co-ox stable 56% on milrinone @ 0.3 Oxygenation good w/ O2 sats 100% on 40% FiO2, CXR looks remarkably clear Acute combined systolic, diastolic and right sided CHF diuresing reasonably well on lasix drip but I/O's only slightly negative last 48 hours - weight reportedly up 9 lbs last 24 hours - accurate? Acute kidney injury likely secondary to ATN +/- prerenal azotemia - BUN/Cr essentially stable last 2-3 days Increased fevers w/out leukocytosis, all cultures negative to date  Thrombocytopenia has resolved, HIT Ab negative, SRA pending - suspect likely DIC which has resolved Encephalopathy persists but has followed some simple commands this morning, likely toxic/metabolic Elevated liver enzymes, likely due to shock-liver, passive congestion - enzymes up further today  Hypokalemia, induced by loop diuretics Hypernatremia, stable   Consider expanding antibiotic coverage - will defer to ID and Pulm/CCM teams  Check ECHO f/u RV function  Continue lasix drip  Consider increasing free water administration  Supplement potassium more aggressively  Check tube feed residuals and watch abdominal exam  Continue amiodarone  Watch liver enzymes   Continue bivalirudin for now  Rexene Alberts, MD 10/29/2016 7:39 AM

## 2016-10-30 ENCOUNTER — Encounter (HOSPITAL_COMMUNITY): Payer: Medicare Other

## 2016-10-30 ENCOUNTER — Inpatient Hospital Stay (HOSPITAL_COMMUNITY): Payer: Medicare Other

## 2016-10-30 DIAGNOSIS — M79609 Pain in unspecified limb: Secondary | ICD-10-CM

## 2016-10-30 DIAGNOSIS — K759 Inflammatory liver disease, unspecified: Secondary | ICD-10-CM

## 2016-10-30 LAB — POTASSIUM: Potassium: 3.9 mmol/L (ref 3.5–5.1)

## 2016-10-30 LAB — BASIC METABOLIC PANEL
Anion gap: 10 (ref 5–15)
Anion gap: 9 (ref 5–15)
BUN: 43 mg/dL — AB (ref 6–20)
BUN: 57 mg/dL — AB (ref 6–20)
CALCIUM: 7.4 mg/dL — AB (ref 8.9–10.3)
CHLORIDE: 110 mmol/L (ref 101–111)
CO2: 27 mmol/L (ref 22–32)
CO2: 27 mmol/L (ref 22–32)
CREATININE: 0.81 mg/dL (ref 0.61–1.24)
Calcium: 7 mg/dL — ABNORMAL LOW (ref 8.9–10.3)
Chloride: 110 mmol/L (ref 101–111)
Creatinine, Ser: 1.1 mg/dL (ref 0.61–1.24)
GFR calc Af Amer: >60 mL/min (ref >60)
GFR calc non Af Amer: >60 mL/min (ref >60)
GFR calc non Af Amer: >60 mL/min (ref >60)
GLUCOSE: 183 mg/dL — AB (ref 65–99)
Glucose, Bld: 218 mg/dL — ABNORMAL HIGH (ref 65–99)
POTASSIUM: 3.9 mmol/L (ref 3.5–5.1)
Potassium: 4.6 mmol/L (ref 3.5–5.1)
SODIUM: 146 mmol/L — AB (ref 135–145)
SODIUM: 147 mmol/L — AB (ref 135–145)

## 2016-10-30 LAB — GLUCOSE, CAPILLARY
GLUCOSE-CAPILLARY: 159 mg/dL — AB (ref 65–99)
GLUCOSE-CAPILLARY: 182 mg/dL — AB (ref 65–99)
GLUCOSE-CAPILLARY: 267 mg/dL — AB (ref 65–99)
Glucose-Capillary: 147 mg/dL — ABNORMAL HIGH (ref 65–99)
Glucose-Capillary: 162 mg/dL — ABNORMAL HIGH (ref 65–99)
Glucose-Capillary: 175 mg/dL — ABNORMAL HIGH (ref 65–99)
Glucose-Capillary: 178 mg/dL — ABNORMAL HIGH (ref 65–99)

## 2016-10-30 LAB — COOXEMETRY PANEL
CARBOXYHEMOGLOBIN: 1.1 % (ref 0.5–1.5)
METHEMOGLOBIN: 1.1 % (ref 0.0–1.5)
O2 SAT: 51.3 %
Total hemoglobin: 11.3 g/dL — ABNORMAL LOW (ref 12.0–16.0)

## 2016-10-30 LAB — SEROTONIN RELEASE ASSAY (SRA)
SRA 100IU/mL UFH Ser-aCnc: 6 % (ref 0–20)
SRA, LOW DOSE HEPARIN: 8 % (ref 0–20)

## 2016-10-30 LAB — CBC
HCT: 33.8 % — ABNORMAL LOW (ref 39.0–52.0)
HEMOGLOBIN: 11.2 g/dL — AB (ref 13.0–17.0)
MCH: 30.9 pg (ref 26.0–34.0)
MCHC: 33.1 g/dL (ref 30.0–36.0)
MCV: 93.1 fL (ref 78.0–100.0)
PLATELETS: 238 10*3/uL (ref 150–400)
RBC: 3.63 MIL/uL — AB (ref 4.22–5.81)
RDW: 16.7 % — ABNORMAL HIGH (ref 11.5–15.5)
WBC: 17.1 10*3/uL — AB (ref 4.0–10.5)

## 2016-10-30 LAB — COMPREHENSIVE METABOLIC PANEL
ALBUMIN: 2.1 g/dL — AB (ref 3.5–5.0)
ALK PHOS: 141 U/L — AB (ref 38–126)
ALT: 478 U/L — AB (ref 17–63)
ANION GAP: 11 (ref 5–15)
AST: 386 U/L — ABNORMAL HIGH (ref 15–41)
BUN: 57 mg/dL — ABNORMAL HIGH (ref 6–20)
CHLORIDE: 106 mmol/L (ref 101–111)
CO2: 31 mmol/L (ref 22–32)
Calcium: 7.5 mg/dL — ABNORMAL LOW (ref 8.9–10.3)
Creatinine, Ser: 1.04 mg/dL (ref 0.61–1.24)
GFR calc non Af Amer: >60 mL/min (ref >60)
GLUCOSE: 176 mg/dL — AB (ref 65–99)
Potassium: 3.1 mmol/L — ABNORMAL LOW (ref 3.5–5.1)
SODIUM: 148 mmol/L — AB (ref 135–145)
Total Bilirubin: 1.5 mg/dL — ABNORMAL HIGH (ref 0.3–1.2)
Total Protein: 5 g/dL — ABNORMAL LOW (ref 6.5–8.1)

## 2016-10-30 LAB — BETA-2-GLYCOPROTEIN I ABS, IGG/M/A
Beta-2-Glycoprotein I IgA: <9 GPI IgA units (ref 0–25)
Beta-2-Glycoprotein I IgM: 9 GPI IgM units (ref 0–32)

## 2016-10-30 LAB — PATHOLOGIST SMEAR REVIEW

## 2016-10-30 LAB — CARDIOLIPIN ANTIBODIES, IGG, IGM, IGA: Anticardiolipin IgM: 41 MPL U/mL — ABNORMAL HIGH (ref 0–12)

## 2016-10-30 LAB — PROTIME-INR
INR: 2.29
Prothrombin Time: 25.6 seconds — ABNORMAL HIGH (ref 11.4–15.2)

## 2016-10-30 LAB — APTT: APTT: 61 s — AB (ref 24–36)

## 2016-10-30 MED ORDER — SODIUM CHLORIDE 0.9 % IV SOLN
30.0000 meq | Freq: Once | INTRAVENOUS | Status: AC
  Administered 2016-10-30: 30 meq via INTRAVENOUS
  Filled 2016-10-30: qty 15

## 2016-10-30 MED ORDER — POTASSIUM CHLORIDE 20 MEQ/15ML (10%) PO SOLN
40.0000 meq | Freq: Four times a day (QID) | ORAL | Status: DC
  Administered 2016-10-30: 40 meq
  Filled 2016-10-30: qty 30

## 2016-10-30 MED ORDER — POTASSIUM CHLORIDE 20 MEQ/15ML (10%) PO SOLN
40.0000 meq | Freq: Three times a day (TID) | ORAL | Status: DC
  Administered 2016-10-30: 40 meq via ORAL
  Filled 2016-10-30: qty 30

## 2016-10-30 MED ORDER — INSULIN GLARGINE 100 UNIT/ML ~~LOC~~ SOLN
5.0000 [IU] | Freq: Every day | SUBCUTANEOUS | Status: DC
  Administered 2016-10-30 – 2016-10-31 (×2): 5 [IU] via SUBCUTANEOUS
  Filled 2016-10-30 (×2): qty 0.05

## 2016-10-30 MED ORDER — POTASSIUM CHLORIDE 20 MEQ/15ML (10%) PO SOLN
40.0000 meq | Freq: Two times a day (BID) | ORAL | Status: DC
  Administered 2016-10-30 – 2016-11-06 (×14): 40 meq
  Filled 2016-10-30 (×14): qty 30

## 2016-10-30 NOTE — Progress Notes (Signed)
Patient ID: Scott [name], male   DOB: 1945-12-02, 70 y.o.   MRN: [id number]     Advanced Heart Failure Rounding Note  Referring Physician: Dr Roxy Manns Primary Physician: Dr Quintin Alto Primary Cardiologist:  Dr Percival Spanish   Reason for Consultation: Heart Failure   Subjective:    Admitted with chest pain. RHC//LHC with 3 vessel disease. Taken to the OR 11/30 for cabg x 3, repair of thoracic ascending aneurysm, AVR bioprosthetic,  MVR, and open chest with VAC placement.    Chest successfully closed 10/26/16.    Coox 51.3% this am on milrinone 0.2 mcg/kg/min.     CVP 12.  Great UO yesterday.  Out 2L net. Weights inaccurate.  Remains intubated and sedated. (On fentanyl and precedex.).  Had to start on phenylephrine due to soft BP.   Suspect DIC and not HIT.  He remains on bivalirudin with RA clot and atrial fibrillation.   Febrile to 102. ID following.  Coverage changed to vancomycin/meroponem, antifungal coverage also added. LFTs now trended down.  Feet remains cold.    TEE 10/26/16 EF 40-45%, small cavity size, flattened/hypokinetic septum, s/p MV repair with mild MR, mild to moderately decreased RV systolic function. + RA thrombus.   Limited echo 10/29/16 LVEF 60-65%, unable to comment on AVR structure or function with poor windows.   Upper and lower extremity doppler studies: No DVTs  Objective:   Weight Range: 257 lb 15 oz (117 kg) Body mass index is 38.09 kg/m.   Vital Signs:   Temp:  [97.3 F (36.3 C)-102.9 F (39.4 C)] 97.3 F (36.3 C) (12/08 1000) Pulse Rate:  [70-131] 97 (12/08 1000) Resp:  [0-38] 14 (12/08 1000) BP: (66-153)/(50-126) 91/59 (12/08 1000) SpO2:  [92 %-100 %] 95 % (12/08 1000) FiO2 (%):  [40 %] 40 % (12/08 0600) Last BM Date: 10/29/16  Weight change: Filed Weights   10/27/16 0500 10/28/16 0458 10/29/16 0422  Weight: 249 lb 9 oz (113.2 kg) 248 lb 3.8 oz (112.6 kg) 257 lb 15 oz (117 kg)    Intake/Output:   Intake/Output Summary (Last 24 hours) at  10/30/16 1105 Last data filed at 10/30/16 1000  Gross per 24 hour  Intake          6849.87 ml  Output             9170 ml  Net         -2320.13 ml     Physical Exam: CVP 12 General: Intubated and sedated. HEENT: ETT in place. Neck: supple. JVP elevated to jaw. Carotids 2+ bilat; no bruits. No thyromegaly or nodule noted.  RIJ swan Cor: PMI nondisplaced. Irregularly irregular. No M/G/R noted. Chest tubes in place.  Lungs: Mechanical breathing sounds. +Rhonchi  Abdomen: soft, ND, no HSM. No bruits or masses. +BS  Extremities: no clubbing, rash. L radial aline. 2+ edema remains. Fingers cyanotic. Feet remain mottled. COLD. Unable to doppler pedal pulses.  Neuro: Intubated and sedated.  GU: Foley in place  Telemetry: Reviewed, showing NSR 90s this am. Further PAF overnight.   Labs: CBC  Recent Labs  10/28/16 1458 10/29/16 0300 10/30/16 0410  WBC 7.6 10.0 17.1*  NEUTROABS 6.3 6.6  --   HGB 9.2* 10.6* 11.2*  HCT 28.0* 32.0* 33.8*  MCV 89.7 91.4 93.1  PLT 155 180 481   Basic Metabolic Panel  Recent Labs  10/27/16 1701  10/30/16 0030 10/30/16 0410  NA  --   < > 147* 148*  K  --   < >  3.9 3.1*  CL  --   < > 110 106  CO2  --   < > 27 31  GLUCOSE  --   < > 183* 176*  BUN  --   < > 57* 57*  CREATININE 1.61*  < > 1.10 1.04  CALCIUM  --   < > 7.0* 7.5*  MG 2.6*  --   --   --   < > = values in this interval not displayed. Liver Function Tests  Recent Labs  10/29/16 0300 10/30/16 0410  AST 875* 386*  ALT 582* 478*  ALKPHOS 160* 141*  BILITOT 1.6* 1.5*  PROT 4.9* 5.0*  ALBUMIN 2.1* 2.1*   No results for input(s): LIPASE, AMYLASE in the last 72 hours. Cardiac Enzymes  Recent Labs  10/29/16 0930  CKTOTAL 212    BNP: BNP (last 3 results) No results for input(s): BNP in the last 8760 hours.  ProBNP (last 3 results) No results for input(s): PROBNP in the last 8760 hours.   D-Dimer No results for input(s): DDIMER in the last 72 hours. Hemoglobin A1C No  results for input(s): HGBA1C in the last 72 hours. Fasting Lipid Panel No results for input(s): CHOL, HDL, LDLCALC, TRIG, CHOLHDL, LDLDIRECT in the last 72 hours. Thyroid Function Tests No results for input(s): TSH, T4TOTAL, T3FREE, THYROIDAB in the last 72 hours.  Invalid input(s): FREET3  Other results:  Imaging/Studies:  US Abdomen Complete  Result Date: 10/29/2016 CLINICAL DATA:  Initial evaluation for elevated LFTs.  Fever. EXAM: ABDOMEN ULTRASOUND COMPLETE COMPARISON:  None available. FINDINGS: Gallbladder: 1.8 cm echogenic stone seen layering within the gallbladder lumen. Possible superimposed sludge. Gallbladder wall measured within normal limits at 2.6 mm. No free pericholecystic fluid. No sonographic Murphy sign elicited on exam. Common bile duct: Diameter: 6 mm. Liver: No focal lesion identified.  Increased hepatic echogenicity. IVC: No abnormality visualized. Pancreas: Not visualized on this exam. Spleen: Size and appearance within normal limits. Right Kidney: Length: 12.2 cm. Echogenicity within normal limits. No mass or hydronephrosis visualized. Left Kidney: Length: 12.1 cm. Echogenicity within normal limits. No mass or hydronephrosis visualized. Abdominal aorta: No aneurysm visualized. Other findings: Ascites was present. IMPRESSION: 1. Cholelithiasis without sonographic evidence for acute cholecystitis. No biliary dilatation. 2. Increased hepatic echogenicity, suggestive of steatosis. 3. Ascites. Electronically Signed   By: Jeannine Boga M.D.   On: 10/29/2016 21:45   [place] Art/ven Flow Abd Pelv Doppler  Result Date: 10/30/2016 CLINICAL DATA:  Evaluation for elevated LFTs.  Ventilated patient. EXAM: DUPLEX ULTRASOUND OF LIVER TECHNIQUE: Color and duplex Doppler ultrasound was performed to evaluate the hepatic in-flow and out-flow vessels. COMPARISON:  Abdominal ultrasound 10/29/2016 FINDINGS: Portal Vein Velocities Main:  22 cm/sec Right:  29 cm/sec Left:  24 cm/sec Hepatic Vein  Velocities Right:  30 cm/sec Middle:  46 cm/sec Left:  41 cm/sec Hepatic Artery Velocity:  25 cm/sec Splenic Vein Velocity:  15 cm/sec Varices: Absent Ascites: Present Normal hepatopetal flow in the portal venous system. Portal vein roughly measures 1.2 cm in diameter. Normal phasicity in IVC. There is to and fro flow at the hepatic veins and limited evaluation. Limited evaluation of hepatic veins is probably related to the patient being on the ventilator and unable to suspend the breathing. There appears to be some ascites in the right lower quadrant. IMPRESSION: Portal venous system is patent with normal direction of flow. Small amount of ascites in the right lower quadrant. Hepatic veins are patent but limited evaluation. Electronically Signed  By: Markus Daft M.D.   On: 10/30/2016 09:59   Dg Chest Port 1 View  Result Date: 10/29/2016 CLINICAL DATA:  Ileus.  Acute respiratory failure EXAM: PORTABLE CHEST 1 VIEW COMPARISON:  10/28/2016 FINDINGS: Support devices including bilateral chest tubes remain in place, unchanged. No pneumothorax. Cardiomegaly. Improving aeration and bibasilar atelectasis. IMPRESSION: Stable support devices.  No pneumothorax. Some improvement in aeration and decreasing bibasilar atelectasis. Electronically Signed   By: Rolm Baptise M.D.   On: 10/29/2016 08:01   Dg Abd Portable 1v  Result Date: 10/29/2016 CLINICAL DATA:  Possible ileus EXAM: PORTABLE ABDOMEN - 1 VIEW COMPARISON:  None. FINDINGS: Scattered large and small bowel gas is noted. No obstructive changes are seen. No free air is noted. No abnormal mass or abnormal calcifications are noted. No bony abnormality is seen. IMPRESSION: No acute abnormality noted. Electronically Signed   By: Inez Catalina M.D.   On: 10/29/2016 08:02    Medications:     Scheduled Medications: . sodium chloride   Intravenous Once  . anidulafungin  100 mg Intravenous Q24H  . aspirin EC  325 mg Oral Daily   Or  . aspirin  324 mg Per Tube Daily   . chlorhexidine gluconate (MEDLINE KIT)  15 mL Mouth Rinse BID  . famotidine (PEPCID) IV  20 mg Intravenous Q12H  . feeding supplement (PRO-STAT SUGAR FREE 64)  30 mL Per Tube BID  . insulin aspart  0-24 Units Subcutaneous Q4H  . insulin glargine  5 Units Subcutaneous Daily  . mouth rinse  15 mL Mouth Rinse QID  . meropenem (MERREM) IV  1 g Intravenous Q8H  . potassium chloride  40 mEq Per Tube Q6H  . potassium chloride (KCL MULTIRUN) 30 mEq in 265 mL IVPB  30 mEq Intravenous Once  . sodium chloride flush  3 mL Intravenous Q12H  . vancomycin  1,250 mg Intravenous Q24H    Infusions: . sodium chloride 20 mL/hr at 10/28/16 0400  . bivalirudin (ANGIOMAX) infusion 0.5 mg/mL (Non-ACS indications) 0.07 mg/kg/hr (10/30/16 1000)  . dexmedetomidine 1.2 mcg/kg/hr (10/30/16 1054)  . dextrose 100 mL/hr at 10/30/16 1000  . feeding supplement (VITAL HIGH PROTEIN) 1,000 mL (10/30/16 1000)  . fentaNYL infusion INTRAVENOUS 200 mcg/hr (10/30/16 1000)  . milrinone 0.2 mcg/kg/min (10/30/16 1000)  . phenylephrine (NEO-SYNEPHRINE) Adult infusion 25 mcg/min (10/30/16 1021)    PRN Medications: ipratropium-albuterol, metoprolol, midazolam, morphine injection, ondansetron (ZOFRAN) IV, sodium chloride flush   Assessment/Plan   Mr Kupper is 70 year old admitted with CP. Complex operation this admission.  CABG x 3 + bioprosthetic AVR for severe bicuspid AS + ascending aorta replacement + MV repair for flail P2.  MV repair complicated by severe SAM, ended up having to remove posterior annuloplasty band.  Chest closed on 12/3.   1. CAD:  10/22/16 S/P CABG x3 with chest left open.  Chest successfully closed am of 10/26/16. - Continue ASA, start statin when LFTs come down.  - Off sedation. Remains unresponsive.  2. Severe Aortic Stenosis: 11/302017 S/P AVR bioprostheic  3. Mitral regurgitation: Partial flail leaflet with MV repair.  Developed SAM initially after repair and posterior annuloplasty ring had to be  removed.  SAM resolved. 4. Acute on chronic systolic CHF:  TEE at time of chest closure showed EF 40-45%, small cavity size, flattened/hypokinetic septum, s/p MV repair with mild MR, mild to moderately decreased RV systolic function, thrombus in RA. Co-ox 51% today with CVP 12.  Very good UOP yesterday but BP  dropped overnight and BUN higher. Phenylephrine started.  - Holding lasix this am to allow equilibration.  Titrate down on phenylephrine as able.  - Replace K, recheck BMET in pm.   - Continue milrinone 0.2 mcg/kg/min for now.  5. ID: ?Source. Tm 102. cultures negative so far.  WBCs 10 -> 17.  - Broad spectrum coverage with meropenem and vancomycin + antifungal.  - ID following.  6. Elevated LFTs: Most likely shock liver.  LFTs now trended down.   - Keep off amio for now as long as rate controlled, can restart if needed.    - No statin until LFTs come down.  - Abdominal US 10/29/16 with Cholelithiasis without sonographic evidence for acute cholecystitis. No biliary dilatation. Increased hepatic echogenicity, suggestive of steatosis. and Ascites.   7. Heme: HIT negative, suspect DIC. Platelet count has recovered.  - On bivalirudin for RA thrombus and atrial fibrillation.  - Will need transition to warfarin eventually.  8. VDRF: CCM following for vent management. Pulmonary edema present, ?PNA.  9. Atrial fibrillation: Paroxysmal post-op. He is in and out of atrial fibrillation.  Rate is controlled.   - Continue anticoagulation => right now on bivalirudin.    - Off amio with LFT elevation. LFTs trending down, may restart if needed for rate control, tolerate HR < 120.  10. PAD: Unable to doppler pulses today, will get peripheral arterial doppler evaluation.   Holding lasix today. He remains critically ill. Sedated on fentanyl and precedex.   Loralie Champagne 10/30/2016 1:04 PM  Advanced Heart Failure Team Pager [phone] (M-F; 7a - 4p)  Please contact [facility] for night-coverage after  hours (4p -7a ) and weekends on [email or web address]

## 2016-10-30 NOTE — Progress Notes (Signed)
PULMONARY / CRITICAL CARE MEDICINE   Name: Scott [name] MRN: [id number] DOB: [birth year]    ADMISSION DATE:  10/20/2016 CONSULTATION DATE: 12/1  REFERRING MD: Ricard Dillon  CHIEF COMPLAINT:  CAD  HISTORY OF PRESENT ILLNESS:   70 yo former smoker with known CAD and ascending aorta aneurysm, who was taken to OR 11/30 for cabg x 3, aortic root replacement, MVR replacement, thoracic cavity left open and had extended pump time in OR. On APRV ventilation post op, CI 1.6 and multiple pressors. PCCM asked to manage vent.  SUBJECTIVE:  Neg 2.2 liters, drop in BP, neo started LFT down  VITAL SIGNS: BP 91/65   Pulse (!) 113   Temp 97.9 F (36.6 C)   Resp 17   Ht 5\' 9"  (1.753 m)   Wt 117 kg (257 lb 15 oz) Comment: cooling blanket now on bed  SpO2 98%   BMI 38.09 kg/m   HEMODYNAMICS: CVP:  [8 mmHg-18 mmHg] 12 mmHg  VENTILATOR SETTINGS: Vent Mode: PRVC FiO2 (%):  [40 %] 40 % Set Rate:  [12 bmp] 12 bmp Vt Set:  [570 mL] 570 mL PEEP:  [5 cmH20] 5 cmH20 Pressure Support:  [5 cmH20] 5 cmH20 Plateau Pressure:  [15 cmH20-18 cmH20] 15 cmH20  INTAKE / OUTPUT: I/O last 3 completed shifts: In: 10307.3 [I.V.:5731; NG/GT:1776.3; IV Piggyback:2800] Out: [id number] [Urine:11500; Chest Tube:1070]  PHYSICAL EXAMINATION: General: sedated on vent, rass deeper -3 Neuro: rass -3 HEENT: jvd remains, ett wnl Cardiovascular: s1 s2 rrr Lungs:  Coarse improved Abdomen:  Soft , BS low, no r Musculoskeletal:  Intact, equal cold mid foot, increased livido anterior rt unchanged Skin: livido unchaged ant foot  LABS:  BMET  Recent Labs Lab 10/29/16 1600 10/30/16 0030 10/30/16 0410  NA 148* 147* 148*  K 2.9* 3.9 3.1*  CL 113* 110 106  CO2 28 27 31   BUN 55* 57* 57*  CREATININE 1.01 1.10 1.04  GLUCOSE 165* 183* 176*    Electrolytes  Recent Labs Lab 10/25/16 0500 10/26/16 0400 10/26/16 1635 10/27/16 0500 10/27/16 1701  10/29/16 1600 10/30/16 0030 10/30/16 0410  CALCIUM 7.8* 7.8* 7.6*  7.5*  --   < > 6.4* 7.0* 7.5*  MG 1.7 1.8 2.6* 2.5* 2.6*  --   --   --   --   PHOS 1.9* 1.7*  --   --   --   --   --   --   --   < > = values in this interval not displayed.  CBC  Recent Labs Lab 10/28/16 1458 10/29/16 0300 10/30/16 0410  WBC 7.6 10.0 17.1*  HGB 9.2* 10.6* 11.2*  HCT 28.0* 32.0* 33.8*  PLT 155 180 238    Coag's  Recent Labs Lab 10/28/16 0500 10/29/16 0300 10/30/16 0410  APTT 80* 65* 61*  INR 2.73 2.63 2.29    Sepsis Markers  Recent Labs Lab 10/27/16 0900 10/28/16 0500 10/29/16 0930 10/29/16 1628  LATICACIDVEN  --   --  2.0* 2.0*  PROCALCITON 4.56 4.33  --   --     ABG  Recent Labs Lab 10/28/16 0500 10/28/16 2305 10/29/16 0417  PHART 7.468* 7.514* 7.475*  PCO2ART 39.0 33.4 36.8  PO2ART 97.0 90.0 90.0    Liver Enzymes  Recent Labs Lab 10/28/16 0500 10/29/16 0300 10/30/16 0410  AST 599* 875* 386*  ALT 528* 582* 478*  ALKPHOS 163* 160* 141*  BILITOT 1.3* 1.6* 1.5*  ALBUMIN 2.0* 2.1* 2.1*    Cardiac Enzymes No results for  input(s): TROPONINI, PROBNP in the last 168 hours.  Glucose  Recent Labs Lab 10/29/16 0744 10/29/16 1305 10/29/16 1559 10/29/16 1942 10/30/16 0031 10/30/16 0410  GLUCAP 206* 214* 156* 150* 178* 182*    Imaging US Abdomen Complete  Result Date: 10/29/2016 CLINICAL DATA:  Initial evaluation for elevated LFTs.  Fever. EXAM: ABDOMEN ULTRASOUND COMPLETE COMPARISON:  None available. FINDINGS: Gallbladder: 1.8 cm echogenic stone seen layering within the gallbladder lumen. Possible superimposed sludge. Gallbladder wall measured within normal limits at 2.6 mm. No free pericholecystic fluid. No sonographic Murphy sign elicited on exam. Common bile duct: Diameter: 6 mm. Liver: No focal lesion identified.  Increased hepatic echogenicity. IVC: No abnormality visualized. Pancreas: Not visualized on this exam. Spleen: Size and appearance within normal limits. Right Kidney: Length: 12.2 cm. Echogenicity within normal  limits. No mass or hydronephrosis visualized. Left Kidney: Length: 12.1 cm. Echogenicity within normal limits. No mass or hydronephrosis visualized. Abdominal aorta: No aneurysm visualized. Other findings: Ascites was present. IMPRESSION: 1. Cholelithiasis without sonographic evidence for acute cholecystitis. No biliary dilatation. 2. Increased hepatic echogenicity, suggestive of steatosis. 3. Ascites. Electronically Signed   By: Jeannine Boga M.D.   On: 10/29/2016 21:45    STUDIES:  12.5 dopplers lowers>>>neg 12/6 dopplers arms >>>neg 12/7 abdo us>>>Cholelithiasis without sonographic evidence for acute cholecystitis. No biliary dilatation.Increased hepatic echogenicity, suggestive of steatosis. Ascites. 12/7 echo>>>60-65%, rv poor images but appeared wnl  CULTURES: 12-1 bc >>> 12/4 BC x 4>>> 12/6 BC>>>  ANTIBIOTICS: 11/30 levaquin>>off 12/3 12/3 ceftriaxone>>>12/7 12/3 vanc>>> 12/7 meropenem>>> 12/7 eraxis>>>  SIGNIFICANT EVENTS: 11/30 surgery, hypoxia 12/4 closed chest 12/7-high fevers, LFt up  LINES/TUBES: 11.30 OTT>> 11/30 rt I j PA cath>>12/4 picc left 12/4>>> 11/30 l rad aline>>12/7 11/30 CT x 4>> 11/30 wound vac>>12/4   ASSESSMENT / PLAN:  PULMONARY A: Acute hypoxic resp failure ALI? OHS Former smoker Chest closed 12/4 Presumed PE ( Rt atrial clot) ALI Pulm edema P:   Neg balance obtained, no longer needed Weaning cpap 5 ps 5, goal 2 hours, assess rsbi, unlikely to extubate until neurostatus improves and resolution Temp curve and etiology pcxr in am   CARDIOVASCULAR A:  Post cabg x 3, MVR, ascending aortic root repalcement Hypotension resolved Fib CAD Rt atrial clot Rt lower ext ischemia likely ( exam equal however, I think I got a signal doppler myself) Hypotension, hypovolemia P:  Amio off, may be able to restart in future with resolving LFT Anticoagulation maintain Echo for RV- reassuring Get art survey-pending, give volume  further  RENAL Lab Results  Component Value Date   CREATININE 1.04 10/30/2016   CREATININE 1.10 10/30/2016   CREATININE 1.01 10/29/2016   CREATININE 0.87 10/19/2016    A:   HypoK mild Hypernatremia P:   Chem in am Lasix to off, agree, appears dry now K supp D5w, consider increase in d5w rate, chem in pm   GASTROINTESTINAL A:   Hx of Gerd Shock liver R/o contribution LFt from lower ext ischemia LFT rise, r/o portal vein thrombosis P:   PPI LFT in am , downward trend noted Cpk neg ldh noted, repeat in am  Keep low peptid formulation, tolerating full rate  US done-   HEMATOLOGIC  Recent Labs  10/29/16 0300 10/30/16 0410  HGB 10.6* 11.2*    A:   Thrombocytopenia - agree with heme, DIC likely was cause ELISA neg 12/4, SRA neg hitt RA clot Digital ischemia hemoconcentration 12/8 wbc P:  Amgiomax, per pharmacy- elisa neg, await SRA HITT pending,  SRA neg scd Follow finger and toes for de margination volume then repeat wbc in am   INFECTIOUS A:   R/o endocarditis Fever 104 12/6 (lower ext ischemia?) At risk endovascular infection Weaning well, pcxr better- unlikely HCAP P:   Would mainatin current regimen, follow fever curve further Echo repeat- no veg apparent, only tte ua - neg 12/7  ENDOCRINE CBG (last 3)   Recent Labs  10/29/16 1942 10/30/16 0031 10/30/16 0410  GLUCAP 150* 178* 182*     A:   DM On d5 Slight rise P:   SSI Add lantus low dose  NEUROLOGIC A:   Post OHS with prolonged pump time 11/30 Awake and follows commands 12/1 Encephalopathy  / oversedation- improved P:   RASS goal: 0 WUA fent Interrupt and dc precedex likely  FAMILY  - Updates: I updated daughter at bedside 12/5, 12/6, 12/7 and wife  - Inter-disciplinary family meet or Palliative Care meeting due by: 12/8   Critical care time 35 min  Lavon Paganini. Titus Mould, MD, [place] Pgr: [facility]

## 2016-10-30 NOTE — Progress Notes (Signed)
CT surgery p.m. Rounds  Patient comfortable sedated on ventilator Urine output and hemodynamic satisfactory Remains on 0.07 mg/kg of bivalirudin for possible HIV T ABIs performed showing values greater than 1 bilaterally Feet appear satisfactory Low-grade fever persists on antibiotics as recommended by ID

## 2016-10-30 NOTE — Progress Notes (Signed)
INFECTIOUS DISEASE PROGRESS NOTE  ID: Scott [name] is a 69 y.o. male with  Principal Problem:   S/P aortic valve replacement + mitral valve repair + CABG x3 + repair of ascending thoracic aortic aneurysm Active Problems:   Severe aortic stenosis by prior echocardiography   Unstable angina (HCC)   Severe aortic stenosis   Coronary artery disease involving native coronary artery of native heart with unstable angina pectoris (HCC)   Bicuspid aortic valve   Thoracic ascending aortic aneurysm (HCC)   Mitral regurgitation due to cusp prolapse   S/P ascending aortic aneurysm repair   S/P mitral valve repair   S/P CABG x 3   Cardiogenic shock (HCC)   Postoperative atrial fibrillation (HCC)   Thrombocytopenia (HCC)   Acute combined systolic and diastolic congestive heart failure ([place])   Acute respiratory failure ([place])   DIC (disseminated intravascular coagulation) ([place])   Elevated liver function tests  Subjective: No response  Abtx:  Anti-infectives    Start     Dose/Rate Route Frequency Ordered Stop   10/30/16 0927  anidulafungin (ERAXIS) 100 mg in sodium chloride 0.9 % 100 mL IVPB     100 mg over 90 Minutes Intravenous Every 24 hours 10/29/16 0927     10/29/16 1000  meropenem (MERREM) 1 g in sodium chloride 0.9 % 100 mL IVPB     1 g 200 mL/hr over 30 Minutes Intravenous Every 8 hours 10/29/16 0931     10/29/16 0930  anidulafungin (ERAXIS) 200 mg in sodium chloride 0.9 % 200 mL IVPB     200 mg over 180 Minutes Intravenous  Once 10/29/16 0927 10/29/16 1349   10/29/16 0600  vancomycin (VANCOCIN) 1,250 mg in sodium chloride 0.9 % 250 mL IVPB     1,250 mg 166.7 mL/hr over 90 Minutes Intravenous Every 24 hours 10/28/16 0900     10/28/16 0600  vancomycin (VANCOCIN) IVPB 750 mg/150 ml premix  Status:  Discontinued     750 mg 150 mL/hr over 60 Minutes Intravenous Every 12 hours 10/27/16 1226 10/28/16 0900   10/27/16 1400  cefTRIAXone (ROCEPHIN) 2 g in dextrose 5 % 50 mL IVPB   Status:  Discontinued     2 g 100 mL/hr over 30 Minutes Intravenous Every 24 hours 10/27/16 1203 10/29/16 0907   10/27/16 1400  rifampin (RIFADIN) 300 mg in sodium chloride 0.9 % 100 mL IVPB  Status:  Discontinued     300 mg 200 mL/hr over 30 Minutes Intravenous Every 8 hours 10/27/16 1226 10/27/16 1426   10/27/16 1330  vancomycin (VANCOCIN) 2,000 mg in sodium chloride 0.9 % 500 mL IVPB     2,000 mg 250 mL/hr over 120 Minutes Intravenous  Once 10/27/16 1226 10/27/16 1451   10/27/16 1000  levofloxacin (LEVAQUIN) IVPB 750 mg  Status:  Discontinued     750 mg 100 mL/hr over 90 Minutes Intravenous Every 24 hours 10/26/16 1054 10/27/16 1203   10/26/16 1815  vancomycin (VANCOCIN) IVPB 1000 mg/200 mL premix     1,000 mg 200 mL/hr over 60 Minutes Intravenous  Once 10/26/16 1054 10/26/16 1752   10/26/16 0954  polymyxin B 500,000 Units, bacitracin 50,000 Units in sodium chloride irrigation 0.9 % 500 mL irrigation  Status:  Discontinued       As needed 10/26/16 0954 10/26/16 1030   10/26/16 0400  vancomycin (VANCOCIN) 1,500 mg in sodium chloride 0.9 % 250 mL IVPB     1,500 mg 125 mL/hr over 120 Minutes Intravenous To  Surgery 10/25/16 1145 10/26/16 0925   10/26/16 0400  vancomycin (VANCOCIN) 1,000 mg in sodium chloride 0.9 % 1,000 mL irrigation      Irrigation To Surgery 10/25/16 1145 10/26/16 0953   10/26/16 0400  levofloxacin (LEVAQUIN) IVPB 500 mg     500 mg 100 mL/hr over 60 Minutes Intravenous To Surgery 10/25/16 1145 10/26/16 0925   10/23/16 1000  levofloxacin (LEVAQUIN) IVPB 750 mg     750 mg 100 mL/hr over 90 Minutes Intravenous Every 24 hours 10/22/16 2130 10/23/16 1047   10/23/16 0515  vancomycin (VANCOCIN) IVPB 1000 mg/200 mL premix     1,000 mg 200 mL/hr over 60 Minutes Intravenous  Once 10/22/16 2130 10/23/16 0600   10/22/16 2045  vancomycin (VANCOCIN) 1,250 mg in sodium chloride 0.9 % 250 mL IVPB  Status:  Discontinued     1,250 mg 166.7 mL/hr over 90 Minutes Intravenous To Surgery  10/22/16 2043 10/22/16 2130   10/22/16 2045  levofloxacin (LEVAQUIN) IVPB 500 mg  Status:  Discontinued     500 mg 100 mL/hr over 60 Minutes Intravenous To Surgery 10/22/16 2043 10/22/16 2130   10/22/16 0400  vancomycin (VANCOCIN) 1,250 mg in sodium chloride 0.9 % 250 mL IVPB     1,250 mg 166.7 mL/hr over 90 Minutes Intravenous To Surgery 10/21/16 1305 10/22/16 2247   10/22/16 0400  vancomycin (VANCOCIN) 1,000 mg in sodium chloride 0.9 % 1,000 mL irrigation      Irrigation To Surgery 10/21/16 1305 10/22/16 0819   10/22/16 0400  levofloxacin (LEVAQUIN) IVPB 500 mg     500 mg 100 mL/hr over 60 Minutes Intravenous To Surgery 10/21/16 1305 10/22/16 2114      Medications:  Scheduled: . sodium chloride   Intravenous Once  . anidulafungin  100 mg Intravenous Q24H  . aspirin EC  325 mg Oral Daily   Or  . aspirin  324 mg Per Tube Daily  . chlorhexidine gluconate (MEDLINE KIT)  15 mL Mouth Rinse BID  . famotidine (PEPCID) IV  20 mg Intravenous Q12H  . feeding supplement (PRO-STAT SUGAR FREE 64)  30 mL Per Tube BID  . insulin aspart  0-24 Units Subcutaneous Q4H  . insulin glargine  5 Units Subcutaneous Daily  . mouth rinse  15 mL Mouth Rinse QID  . meropenem (MERREM) IV  1 g Intravenous Q8H  . potassium chloride  40 mEq Per Tube Q6H  . potassium chloride (KCL MULTIRUN) 30 mEq in 265 mL IVPB  30 mEq Intravenous Once  . sodium chloride flush  3 mL Intravenous Q12H  . vancomycin  1,250 mg Intravenous Q24H    Objective: Vital signs in last 24 hours: Temp:  [97.5 F (36.4 C)-102.9 F (39.4 C)] 97.5 F (36.4 C) (12/08 0900) Pulse Rate:  [70-131] 113 (12/08 0700) Resp:  [0-38] 15 (12/08 0900) BP: (66-158)/(50-126) 101/69 (12/08 0900) SpO2:  [80 %-100 %] 100 % (12/08 0900) Arterial Line BP: (128-175)/(68-90) 168/86 (12/07 1015) FiO2 (%):  [40 %] 40 % (12/08 0600)   General appearance: no distress Resp: good air mvmt. no rhonchi Chest wall: midline wound clean.  Cardio: regular rate  and rhythm GI: normal findings: bowel sounds normal and soft, non-tender Extremities: livido, cool extrem  Lab Results  Recent Labs  10/29/16 0300  10/30/16 0030 10/30/16 0410  WBC 10.0  --   --  17.1*  HGB 10.6*  --   --  11.2*  HCT 32.0*  --   --  33.8*  NA 149*  < >  147* 148*  K 3.1*  < > 3.9 3.1*  CL 115*  < > 110 106  CO2 26  < > 27 31  BUN 59*  < > 57* 57*  CREATININE 1.34*  < > 1.10 1.04  < > = values in this interval not displayed. Liver Panel  Recent Labs  10/29/16 0300 10/30/16 0410  PROT 4.9* 5.0*  ALBUMIN 2.1* 2.1*  AST 875* 386*  ALT 582* 478*  ALKPHOS 160* 141*  BILITOT 1.6* 1.5*   Sedimentation Rate No results for input(s): ESRSEDRATE in the last 72 hours. C-Reactive Protein No results for input(s): CRP in the last 72 hours.  Microbiology: Recent Results (from the past 240 hour(s))  Surgical pcr screen     Status: None   Collection Time: 10/21/16  4:18 AM  Result Value Ref Range Status   MRSA, PCR NEGATIVE NEGATIVE Final   Staphylococcus aureus NEGATIVE NEGATIVE Final    Comment:        The Xpert SA Assay (FDA approved for NASAL specimens in patients over 54 years of age), is one component of a comprehensive surveillance program.  Test performance has been validated by [facility] for patients greater than or equal to 57 year old. It is not intended to diagnose infection nor to guide or monitor treatment.   Culture, blood (Routine X 2) w Reflex to ID Panel     Status: None   Collection Time: 10/23/16  3:18 PM  Result Value Ref Range Status   Specimen Description BLOOD RIGHT HAND  Final   Special Requests IN PEDIATRIC BOTTLE 1CC  Final   Culture NO GROWTH 6 DAYS  Final   Report Status 10/29/2016 FINAL  Final  Culture, blood (Routine X 2) w Reflex to ID Panel     Status: None   Collection Time: 10/23/16  3:23 PM  Result Value Ref Range Status   Specimen Description BLOOD RIGHT ANTECUBITAL  Final   Special Requests IN PEDIATRIC BOTTLE  3CC  Final   Culture NO GROWTH 6 DAYS  Final   Report Status 10/29/2016 FINAL  Final  Culture, blood (routine x 2)     Status: None (Preliminary result)   Collection Time: 10/26/16 12:45 PM  Result Value Ref Range Status   Specimen Description BLOOD RIGHT ANTECUBITAL  Final   Special Requests IN PEDIATRIC BOTTLE 1.5CC  Final   Culture NO GROWTH 3 DAYS  Final   Report Status PENDING  Incomplete  Culture, blood (routine x 2)     Status: None (Preliminary result)   Collection Time: 10/26/16 12:50 PM  Result Value Ref Range Status   Specimen Description BLOOD RIGHT HAND  Final   Special Requests IN PEDIATRIC BOTTLE 3CC  Final   Culture NO GROWTH 3 DAYS  Final   Report Status PENDING  Incomplete  Culture, blood (Routine X 2) w Reflex to ID Panel     Status: None (Preliminary result)   Collection Time: 10/29/16  5:51 AM  Result Value Ref Range Status   Specimen Description BLOOD RIGHT ARM  Final   Special Requests BOTTLES DRAWN AEROBIC AND ANAEROBIC 5CC EA  Final   Culture PENDING  Incomplete   Report Status PENDING  Incomplete  Culture, respiratory (NON-Expectorated)     Status: None (Preliminary result)   Collection Time: 10/29/16 10:21 AM  Result Value Ref Range Status   Specimen Description TRACHEAL ASPIRATE  Final   Special Requests NONE  Final   Gram Stain  Final    FEW WBC PRESENT, PREDOMINANTLY PMN MODERATE GRAM NEGATIVE RODS RARE GRAM POSITIVE COCCI    Culture PENDING  Incomplete   Report Status PENDING  Incomplete    Studies/Results: US Abdomen Complete  Result Date: 10/29/2016 CLINICAL DATA:  Initial evaluation for elevated LFTs.  Fever. EXAM: ABDOMEN ULTRASOUND COMPLETE COMPARISON:  None available. FINDINGS: Gallbladder: 1.8 cm echogenic stone seen layering within the gallbladder lumen. Possible superimposed sludge. Gallbladder wall measured within normal limits at 2.6 mm. No free pericholecystic fluid. No sonographic Murphy sign elicited on exam. Common bile duct:  Diameter: 6 mm. Liver: No focal lesion identified.  Increased hepatic echogenicity. IVC: No abnormality visualized. Pancreas: Not visualized on this exam. Spleen: Size and appearance within normal limits. Right Kidney: Length: 12.2 cm. Echogenicity within normal limits. No mass or hydronephrosis visualized. Left Kidney: Length: 12.1 cm. Echogenicity within normal limits. No mass or hydronephrosis visualized. Abdominal aorta: No aneurysm visualized. Other findings: Ascites was present. IMPRESSION: 1. Cholelithiasis without sonographic evidence for acute cholecystitis. No biliary dilatation. 2. Increased hepatic echogenicity, suggestive of steatosis. 3. Ascites. Electronically Signed   By: Jeannine Boga M.D.   On: 10/29/2016 21:45   Dg Chest Port 1 View  Result Date: 10/29/2016 CLINICAL DATA:  Ileus.  Acute respiratory failure EXAM: PORTABLE CHEST 1 VIEW COMPARISON:  10/28/2016 FINDINGS: Support devices including bilateral chest tubes remain in place, unchanged. No pneumothorax. Cardiomegaly. Improving aeration and bibasilar atelectasis. IMPRESSION: Stable support devices.  No pneumothorax. Some improvement in aeration and decreasing bibasilar atelectasis. Electronically Signed   By: Rolm Baptise M.D.   On: 10/29/2016 08:01   Dg Abd Portable 1v  Result Date: 10/29/2016 CLINICAL DATA:  Possible ileus EXAM: PORTABLE ABDOMEN - 1 VIEW COMPARISON:  None. FINDINGS: Scattered large and small bowel gas is noted. No obstructive changes are seen. No free air is noted. No abnormal mass or abnormal calcifications are noted. No bony abnormality is seen. IMPRESSION: No acute abnormality noted. Electronically Signed   By: Inez Catalina M.D.   On: 10/29/2016 08:02     Assessment/Plan: Post cardiotomy shock             CABG, MVR, AVR, aneurysm repair  Fever post cardiac surgery Vegetation vs Clot in RA Hepatitis  Total days of antibiotics: 3 (vanco/merrem/anidulafungin)  Tmax 102.9 TTE yesterday no comment  on vegetations  LFTs better. U/s does not show cholecystitis.  Suspect "shock liver" If fever persist, could consider CT chest/abd Hopefully taper anbx soon discussed with family          Bobby Rumpf Infectious Diseases (pager) [phone] [email or web address] 10/30/2016, 9:27 AM  LOS: 10 days

## 2016-10-30 NOTE — Progress Notes (Signed)
Pt placed back on full vent support due to increased WOB & RR.

## 2016-10-30 NOTE — Progress Notes (Signed)
VASCULAR LAB PRELIMINARY  ARTERIAL  ABI completed: Sudden declination in ABI's of unknown etiology since the patient's last ABI taken on 10/21/16. Unable to detect bilateral posterior tibial artery waveforms. Unable to obtain bilateral TBI's due to lack of waveforms in the great toes. Right ABI of 1.91 and left ABI of 1.69 are suggestive of calcified arteries.    RIGHT    LEFT    PRESSURE WAVEFORM  PRESSURE WAVEFORM  BRACHIAL 86 Biphasic BRACHIAL IV Biphasic  DP 164 Monophasic DP 145 Monophasic    RIGHT LEFT  ABI 1.91 1.69     Legrand Como, RVT 10/30/2016, 4:39 PM

## 2016-10-30 NOTE — Progress Notes (Signed)
ANTICOAGULATION CONSULT NOTE - Follow Up Consult  Pharmacy Consult for Bivalirudin Indication: possible clot in RA and afib  Allergies  Allergen Reactions  . Doxycycline Hives  . Penicillins Hives     Has patient had a PCN reaction causing immediate rash, facial/tongue/throat swelling, SOB or lightheadedness with hypotension: No Has patient had a PCN reaction causing severe rash involving mucus membranes or skin necrosis: No Has patient had a PCN reaction that required hospitalization: No Has patient had a PCN reaction occurring within the last 10 years: Yes If all of the above answers are "NO", then may proceed with Cephalosporin use.   . Sulfa Antibiotics Hives    Patient Measurements: Height: 5\' 9"  (175.3 cm) Weight: 257 lb 15 oz (117 kg) (cooling blanket now on bed) IBW/kg (Calculated) : 70.7  Vital Signs: Temp: 97.3 F (36.3 C) (12/08 1000) Temp Source: Core (Comment) (12/08 0400) BP: 91/59 (12/08 1000) Pulse Rate: 97 (12/08 1000)  Labs:  Recent Labs  10/28/16 0500 10/28/16 1458 10/29/16 0300 10/29/16 0930 10/29/16 1600 10/30/16 0030 10/30/16 0410  HGB 8.1* 9.2* 10.6*  --   --   --  11.2*  HCT 25.1* 28.0* 32.0*  --   --   --  33.8*  PLT 155 155 180  --   --   --  238  APTT 80*  --  65*  --   --   --  61*  LABPROT 29.5*  --  28.6*  --   --   --  25.6*  INR 2.73  --  2.63  --   --   --  2.29  CREATININE 1.42*  --  1.34*  --  1.01 1.10 1.04  CKTOTAL  --   --   --  212  --   --   --     Estimated Creatinine Clearance: 83.4 mL/min (by C-G formula based on SCr of 1.04 mg/dL).   Medications:  Bivalirudin @ 0.07mg /kg/hr  Assessment: Scott [name] continues on bivalirudin for possible right atrial clot and afib. APTT is therapeutic at 61 seconds. HIT ruled out (low heparin antibody and SRA negative). Heparin allergy removed. Platelets improved to 238.   Goal of Therapy:  aPTT 50-85 seconds Monitor platelets by anticoagulation protocol: Yes   Plan:  1) Continue  bivalirudin @ 0.07mg /kg/hr 2) Daily APTT 3) Follow up oral anticoagulation  Scott [name] 10/30/2016,11:18 AM

## 2016-10-30 NOTE — Progress Notes (Addendum)
[address]       [place] [address]             [phone]        CARDIOTHORACIC SURGERY PROGRESS NOTE   R4 Days Post-Op Procedure(s) (LRB): STERNAL WASHOUT AND DELAYED PRIMARY CLOSURE (N/A) TRANSESOPHAGEAL ECHOCARDIOGRAM (TEE) (N/A)  Subjective: Heavily sedated on vent at present.  Objective: Vital signs: BP Readings from Last 1 Encounters:  10/30/16 91/65   Pulse Readings from Last 1 Encounters:  10/30/16 (!) 113   Resp Readings from Last 1 Encounters:  10/30/16 17   Temp Readings from Last 1 Encounters:  10/30/16 97.9 F (36.6 C)    Hemodynamics: CVP:  [8 mmHg-18 mmHg] 12 mmHg  Mixed venous co-ox 51.3%  Physical Exam:  Rhythm:   Sinus tach 100-110  Breath sounds: clear  Heart sounds:  RRR  Incisions:  Clean and dry  Abdomen:  Soft, non-distended, non-tender  Extremities:  Warm, well-perfused  Chest tubes:  decreasing volume thin serosanguinous output, no air leak   Intake/Output from previous day: 12/07 0701 - 12/08 0700 In: 7582 [I.V.:4190.7; NG/GT:1171.3; IV Piggyback:2220] Out: 9620 [Urine:8950; Chest Tube:670] Intake/Output this shift: Total I/O In: 246.8 [I.V.:191.8; NG/GT:55] Out: 600 [Urine:600]  Lab Results:  CBC: Recent Labs  10/29/16 0300 10/30/16 0410  WBC 10.0 17.1*  HGB 10.6* 11.2*  HCT 32.0* 33.8*  PLT 180 238    BMET:  Recent Labs  10/30/16 0030 10/30/16 0410  NA 147* 148*  K 3.9 3.1*  CL 110 106  CO2 27 31  GLUCOSE 183* 176*  BUN 57* 57*  CREATININE 1.10 1.04  CALCIUM 7.0* 7.5*     PT/INR:   Recent Labs  10/30/16 0410  LABPROT 25.6*  INR 2.29    CBG (last 3)   Recent Labs  10/30/16 0031 10/30/16 0410 10/30/16 0840  GLUCAP 178* 182* 267*    ABG    Component Value Date/Time   PHART 7.475 (H) 10/29/2016 0417   PCO2ART 36.8 10/29/2016 0417   PO2ART 90.0 10/29/2016 0417   HCO3 26.6 10/29/2016 0417   TCO2 28 10/29/2016 0417   ACIDBASEDEF 1.0 10/27/2016 1125   O2SAT 51.3  10/30/2016 0409    CXR: n/a  Assessment/Plan: S/P Procedure(s) (LRB): STERNAL WASHOUT AND DELAYED PRIMARY CLOSURE (N/A) TRANSESOPHAGEAL ECHOCARDIOGRAM (TEE) (N/A)  CV:  Currently looks intravascularly volume-depleted with decreased CVP increased HR and decreased co-ox.  Neo drip started overnight for hypotension.  Follow up ECHO yesterday w/ normal/hyperdynamic LV function and normal RV size and function, normal functioning aortic valve prosthesis and only mild MR.  Will temporarily stop lasix drip and begin weaning milrinone off.  RESP:  Remarkably stable and potentially ready for acute vent wean and extubation soon once fevers and encephalopathy improved.  Oxygenation quite good and CXR done yesterday clear.  Bilateral pleural tubes draining decreasing volume thin serosanguinous fluid - leave in place until output decreases further.  ID:  Continues to spike fevers although improved over last 24 hours.  Now on Vanc, meropenem and anidulafungin.  WBC increased 17k.  Surgical incisions look good.  Extremities w/ improved appearance.  NEURO: Currently heavily sedated but encephalopathy resolving as yesterday patient was following commands.   RENAL: BUN remains elevated, creatinine down to 1.0 today.  Making excellent UOP 2 liters negative yesterday on lasix drip.  Currently looks a bit intravascularly dry although still overall severely volume overloaded  GI:  Tolerating full support tube feeds.  LFT's down  somewhat today.  Abdominal U/S pending  HEME: Hgb/Hct increased.  Platelet count normal. Continue bivalirudin for now.  Ultimately will plan to start coumadin.  Rexene Alberts, MD 10/30/2016 8:46 AM

## 2016-10-30 NOTE — Care Management Important Message (Signed)
Important Message  Patient Details  Name: Scott [name] MRN: [id number] Date of Birth: 06-23-1946   Medicare Important Message Given:  Yes    Richie Bonanno Abena 10/30/2016, 12:12 PM

## 2016-10-31 ENCOUNTER — Inpatient Hospital Stay (HOSPITAL_COMMUNITY): Payer: Medicare Other

## 2016-10-31 DIAGNOSIS — R5082 Postprocedural fever: Secondary | ICD-10-CM

## 2016-10-31 DIAGNOSIS — Z881 Allergy status to other antibiotic agents status: Secondary | ICD-10-CM

## 2016-10-31 DIAGNOSIS — G9340 Encephalopathy, unspecified: Secondary | ICD-10-CM

## 2016-10-31 DIAGNOSIS — Z8249 Family history of ischemic heart disease and other diseases of the circulatory system: Secondary | ICD-10-CM

## 2016-10-31 DIAGNOSIS — Z800 Family history of malignant neoplasm of digestive organs: Secondary | ICD-10-CM

## 2016-10-31 DIAGNOSIS — Z9689 Presence of other specified functional implants: Secondary | ICD-10-CM

## 2016-10-31 DIAGNOSIS — Z9911 Dependence on respirator [ventilator] status: Secondary | ICD-10-CM

## 2016-10-31 DIAGNOSIS — Z87891 Personal history of nicotine dependence: Secondary | ICD-10-CM

## 2016-10-31 DIAGNOSIS — Z9889 Other specified postprocedural states: Secondary | ICD-10-CM

## 2016-10-31 DIAGNOSIS — Z880 Allergy status to penicillin: Secondary | ICD-10-CM

## 2016-10-31 DIAGNOSIS — Z952 Presence of prosthetic heart valve: Secondary | ICD-10-CM

## 2016-10-31 DIAGNOSIS — Z832 Family history of diseases of the blood and blood-forming organs and certain disorders involving the immune mechanism: Secondary | ICD-10-CM

## 2016-10-31 DIAGNOSIS — Z801 Family history of malignant neoplasm of trachea, bronchus and lung: Secondary | ICD-10-CM

## 2016-10-31 LAB — GLUCOSE, CAPILLARY
GLUCOSE-CAPILLARY: 199 mg/dL — AB (ref 65–99)
GLUCOSE-CAPILLARY: 298 mg/dL — AB (ref 65–99)
GLUCOSE-CAPILLARY: 167 mg/dL — AB (ref 65–99)
Glucose-Capillary: 286 mg/dL — ABNORMAL HIGH (ref 65–99)
Glucose-Capillary: 195 mg/dL — ABNORMAL HIGH (ref 65–99)

## 2016-10-31 LAB — CULTURE, RESPIRATORY: CULTURE: NORMAL

## 2016-10-31 LAB — COMPREHENSIVE METABOLIC PANEL
ALT: 294 U/L — ABNORMAL HIGH (ref 17–63)
ANION GAP: 9 (ref 5–15)
AST: 122 U/L — AB (ref 15–41)
Albumin: 1.7 g/dL — ABNORMAL LOW (ref 3.5–5.0)
Alkaline Phosphatase: 127 U/L — ABNORMAL HIGH (ref 38–126)
BUN: 43 mg/dL — AB (ref 6–20)
CHLORIDE: 108 mmol/L (ref 101–111)
CO2: 28 mmol/L (ref 22–32)
Calcium: 7.3 mg/dL — ABNORMAL LOW (ref 8.9–10.3)
Creatinine, Ser: 0.77 mg/dL (ref 0.61–1.24)
Glucose, Bld: 299 mg/dL — ABNORMAL HIGH (ref 65–99)
POTASSIUM: 3.9 mmol/L (ref 3.5–5.1)
Sodium: 145 mmol/L (ref 135–145)
Total Bilirubin: 1.4 mg/dL — ABNORMAL HIGH (ref 0.3–1.2)
Total Protein: 4.8 g/dL — ABNORMAL LOW (ref 6.5–8.1)

## 2016-10-31 LAB — CULTURE, BLOOD (ROUTINE X 2)
CULTURE: NO GROWTH
CULTURE: NO GROWTH

## 2016-10-31 LAB — LACTATE DEHYDROGENASE: LDH: 498 U/L — AB (ref 98–192)

## 2016-10-31 LAB — COOXEMETRY PANEL
Carboxyhemoglobin: 1.7 % — ABNORMAL HIGH (ref 0.5–1.5)
METHEMOGLOBIN: 0.9 % (ref 0.0–1.5)
O2 Saturation: 61.1 %
TOTAL HEMOGLOBIN: 11 g/dL — AB (ref 12.0–16.0)

## 2016-10-31 LAB — PROTIME-INR
INR: 4.34 — AB
PROTHROMBIN TIME: 42.7 s — AB (ref 11.4–15.2)

## 2016-10-31 LAB — CBC
HEMATOCRIT: 34.8 % — AB (ref 39.0–52.0)
Hemoglobin: 10.9 g/dL — ABNORMAL LOW (ref 13.0–17.0)
MCH: 29.8 pg (ref 26.0–34.0)
MCHC: 31.3 g/dL (ref 30.0–36.0)
MCV: 95.1 fL (ref 78.0–100.0)
Platelets: 226 10*3/uL (ref 150–400)
RBC: 3.66 MIL/uL — ABNORMAL LOW (ref 4.22–5.81)
RDW: 17.1 % — AB (ref 11.5–15.5)
WBC: 14.3 10*3/uL — AB (ref 4.0–10.5)

## 2016-10-31 LAB — CULTURE, RESPIRATORY W GRAM STAIN

## 2016-10-31 LAB — APTT: APTT: 85 s — AB (ref 24–36)

## 2016-10-31 MED ORDER — FUROSEMIDE 10 MG/ML IJ SOLN
INTRAMUSCULAR | Status: AC
  Filled 2016-10-31: qty 4

## 2016-10-31 MED ORDER — ACETAMINOPHEN 160 MG/5ML PO SOLN
650.0000 mg | Freq: Four times a day (QID) | ORAL | Status: AC | PRN
  Administered 2016-10-31 – 2016-11-08 (×10): 650 mg
  Filled 2016-10-31 (×10): qty 20.3

## 2016-10-31 MED ORDER — INSULIN GLARGINE 100 UNIT/ML ~~LOC~~ SOLN
10.0000 [IU] | Freq: Every day | SUBCUTANEOUS | Status: DC
  Administered 2016-11-01: 10 [IU] via SUBCUTANEOUS
  Filled 2016-10-31: qty 0.1

## 2016-10-31 MED ORDER — ALBUMIN HUMAN 25 % IV SOLN
12.5000 g | Freq: Four times a day (QID) | INTRAVENOUS | Status: DC
  Administered 2016-10-31 – 2016-11-02 (×6): 12.5 g via INTRAVENOUS
  Filled 2016-10-31: qty 100
  Filled 2016-10-31 (×5): qty 50

## 2016-10-31 MED ORDER — FUROSEMIDE 10 MG/ML IJ SOLN
40.0000 mg | Freq: Every day | INTRAMUSCULAR | Status: DC
  Administered 2016-10-31 – 2016-11-01 (×2): 40 mg via INTRAVENOUS
  Filled 2016-10-31: qty 4

## 2016-10-31 NOTE — Progress Notes (Signed)
[facility] for Infectious Disease  Date of Admission:  10/20/2016   Total days of antibiotics 6        Day 3 vancomycin        Day 3 meropenem        Day 3 anidulafungin  Principal Problem:   S/P aortic valve replacement + mitral valve repair + CABG x3 + repair of ascending thoracic aortic aneurysm Active Problems:   Severe aortic stenosis by prior echocardiography   Unstable angina (HCC)   Severe aortic stenosis   Coronary artery disease involving native coronary artery of native heart with unstable angina pectoris (HCC)   Bicuspid aortic valve   Thoracic ascending aortic aneurysm (HCC)   Mitral regurgitation due to cusp prolapse   S/P ascending aortic aneurysm repair   S/P mitral valve repair   S/P CABG x 3   Cardiogenic shock (HCC)   Postoperative atrial fibrillation (HCC)   Thrombocytopenia (HCC)   Acute combined systolic and diastolic congestive heart failure (HCC)   Acute respiratory failure ([place])   DIC (disseminated intravascular coagulation) (HCC)   Elevated liver function tests   . sodium chloride   Intravenous Once  . anidulafungin  100 mg Intravenous Q24H  . aspirin EC  325 mg Oral Daily   Or  . aspirin  324 mg Per Tube Daily  . chlorhexidine gluconate (MEDLINE KIT)  15 mL Mouth Rinse BID  . famotidine (PEPCID) IV  20 mg Intravenous Q12H  . feeding supplement (PRO-STAT SUGAR FREE 64)  30 mL Per Tube BID  . furosemide  40 mg Intravenous Daily  . insulin aspart  0-24 Units Subcutaneous Q4H  . [START ON 11/01/2016] insulin glargine  10 Units Subcutaneous Daily  . mouth rinse  15 mL Mouth Rinse QID  . meropenem (MERREM) IV  1 g Intravenous Q8H  . potassium chloride  40 mEq Per Tube Q12H  . sodium chloride flush  3 mL Intravenous Q12H  . vancomycin  1,250 mg Intravenous Q24H   Review of Systems: Review of Systems  Unable to perform ROS: Intubated    Past Medical History:  Diagnosis Date  . Arthritis    "hips, shoulders; knees; back"  (10/20/2016)  . Bicuspid aortic valve   . BPH (benign prostatic hypertrophy)   . Carpal tunnel syndrome of right wrist   . Coronary artery disease involving native coronary artery of native heart with unstable angina pectoris ([place]) 10/20/2016  . Diverticulitis   . GERD (gastroesophageal reflux disease)   . Gout   . Heart murmur   . Hyperlipemia   . Hypertension   . Postoperative atrial fibrillation ([place]) 10/24/2016  . RLS (restless legs syndrome)   . S/P aortic valve replacement with bioprosthetic valve 10/22/2016   25 mm [facility] Ease bovine pericardial bioprosthetic tissue valve  . S/P ascending aortic aneurysm repair 10/22/2016   28 mm supracoronary straight graft replacement of ascending thoracic aortic aneurysm  . S/P CABG x 3 10/22/2016   Sequential LIMA to Diag and LAD, SVG to distal LAD, open vein harvest right thigh  . S/P mitral valve repair 10/22/2016   Artificial Gore-tex neochord placement x6 - posterior annuloplasty band placed but removed due to systolic anterior motion of mitral valve  . Severe aortic stenosis   . Snores    Never been tested for sleep apnea  . Thoracic ascending aortic aneurysm ([place]) 10/21/2016  . Thrombocytopenia ([place]) 10/22/2016  . Type II diabetes mellitus (  [facility])     Social History  Substance Use Topics  . Smoking status: Former Smoker    Years: 3.00    Types: Pipe, Cigars    Quit date: 1984  . Smokeless tobacco: Never Used  . Alcohol use 6.0 oz/week    10 Cans of beer per week    Family History  Problem Relation Age of Onset  . Lung cancer Mother   . Clotting disorder Father     No details  . Heart disease Sister 41    Stents  . Cancer Sister     Throat   Allergies  Allergen Reactions  . Doxycycline Hives  . Penicillins Hives     Has patient had a PCN reaction causing immediate rash, facial/tongue/throat swelling, SOB or lightheadedness with hypotension: No Has patient had a PCN reaction causing severe rash involving  mucus membranes or skin necrosis: No Has patient had a PCN reaction that required hospitalization: No Has patient had a PCN reaction occurring within the last 10 years: Yes If all of the above answers are "NO", then may proceed with Cephalosporin use.   . Sulfa Antibiotics Hives    OBJECTIVE: Vitals:   10/31/16 1130 10/31/16 1226 10/31/16 1230 10/31/16 1300  BP: 97/66 (!) 88/71 (!) 137/91 (!) 82/57  Pulse: (!) 118 (!) 129 (!) 131 97  Resp: 11 (!) _0 Temp: (!) 100.4 F (38 C)  99.7 F (37.6 C) 99.3 F (37.4 C)  TempSrc:      SpO2: 97% 96% 97% 96%  Weight:      Height:       Body mass index is 38.35 kg/m.  Physical Exam  Constitutional:  Unresponsive on the ventilator.  Cardiovascular: Normal rate and regular rhythm.   No murmur heard. Distant heart sounds.  Pulmonary/Chest: Breath sounds normal. He has no wheezes. He has no rales.  Bilateral chest tubes. Sternal incision looks good.  Abdominal: Soft. Bowel sounds are normal. He exhibits distension.  Skin: No rash noted.    Lab Results Lab Results  Component Value Date   WBC 14.3 (H) 10/31/2016   HGB 10.9 (L) 10/31/2016   HCT 34.8 (L) 10/31/2016   MCV 95.1 10/31/2016   PLT 226 10/31/2016    Lab Results  Component Value Date   CREATININE 0.77 10/31/2016   BUN 43 (H) 10/31/2016   NA 145 10/31/2016   K 3.9 10/31/2016   CL 108 10/31/2016   CO2 28 10/31/2016    Lab Results  Component Value Date   ALT 294 (H) 10/31/2016   AST 122 (H) 10/31/2016   ALKPHOS 127 (H) 10/31/2016   BILITOT 1.4 (H) 10/31/2016     Microbiology: Recent Results (from the past 240 hour(s))  Culture, blood (Routine X 2) w Reflex to ID Panel     Status: None   Collection Time: 10/23/16  3:18 PM  Result Value Ref Range Status   Specimen Description BLOOD RIGHT HAND  Final   Special Requests IN PEDIATRIC BOTTLE 1CC  Final   Culture NO GROWTH 6 DAYS  Final   Report Status 10/29/2016 FINAL  Final  Culture, blood (Routine X 2) w  Reflex to ID Panel     Status: None   Collection Time: 10/23/16  3:23 PM  Result Value Ref Range Status   Specimen Description BLOOD RIGHT ANTECUBITAL  Final   Special Requests IN PEDIATRIC BOTTLE 3CC  Final   Culture NO GROWTH 6 DAYS  Final   Report  Status 10/29/2016 FINAL  Final  Culture, blood (routine x 2)     Status: None (Preliminary result)   Collection Time: 10/26/16 12:45 PM  Result Value Ref Range Status   Specimen Description BLOOD RIGHT ANTECUBITAL  Final   Special Requests IN PEDIATRIC BOTTLE 1.5CC  Final   Culture NO GROWTH 4 DAYS  Final   Report Status PENDING  Incomplete  Culture, blood (routine x 2)     Status: None (Preliminary result)   Collection Time: 10/26/16 12:50 PM  Result Value Ref Range Status   Specimen Description BLOOD RIGHT HAND  Final   Special Requests IN PEDIATRIC BOTTLE 3CC  Final   Culture NO GROWTH 4 DAYS  Final   Report Status PENDING  Incomplete  Culture, blood (Routine X 2) w Reflex to ID Panel     Status: None (Preliminary result)   Collection Time: 10/29/16  2:47 AM  Result Value Ref Range Status   Specimen Description BLOOD RIGHT HAND  Final   Special Requests IN PEDIATRIC BOTTLE 1CC  Final   Culture NO GROWTH 1 DAY  Final   Report Status PENDING  Incomplete  Culture, blood (Routine X 2) w Reflex to ID Panel     Status: None (Preliminary result)   Collection Time: 10/29/16  5:51 AM  Result Value Ref Range Status   Specimen Description BLOOD RIGHT ARM  Final   Special Requests BOTTLES DRAWN AEROBIC AND ANAEROBIC 5CC   Final   Culture NO GROWTH 1 DAY  Final   Report Status PENDING  Incomplete  Culture, respiratory (NON-Expectorated)     Status: None   Collection Time: 10/29/16 10:21 AM  Result Value Ref Range Status   Specimen Description TRACHEAL ASPIRATE  Final   Special Requests NONE  Final   Gram Stain   Final    FEW WBC PRESENT, PREDOMINANTLY PMN MODERATE GRAM NEGATIVE RODS RARE GRAM POSITIVE COCCI    Culture Consistent with  normal respiratory flora.  Final   Report Status 10/31/2016 FINAL  Final     ASSESSMENT: He began having fever immediately after his open heart surgery on 10/22/2016. Small right atrial clots were noted at the time of his sternal wound closure on 10/26/2016. All blood cultures have been negative. The source for his fever remains unclear. I suspect that his fevers may be noninfectious. His fevers are not responding to broad empiric antimicrobial therapy. I do not feel like he needs continued meropenem and anidulafungin. I will continue vancomycin alone and would consider stopping all antibiotics soon if there is no clear evidence of infection.  PLAN: 1. Continue vancomycin 2. Discontinue meropenem and anidulafungin  Michel Bickers, MD [facility] [phone] pager   [phone] cell 10/31/2016, 2:19 PM

## 2016-10-31 NOTE — Progress Notes (Signed)
Patient placed back on full support at 1328.Marland Kitchen

## 2016-10-31 NOTE — Progress Notes (Signed)
Dr. Darcey Nora updated via telephone that this RN is unable to doppler a pulse in LLE. Pt is currently cold (warming blanket in place) and on 20 mcg/min of phenylephrine. Orders received to put warm packs on foot and re-assess. Pt dose have palpable femoral pulses and doppler right DP. Will continue to assess for return of doppler pulse.   Pt also found to have high tube feed residual of 400cc at 2000. Orders received from Dr. Darcey Nora to hold TF for 4 hours and re-start @ 47mL/hr.

## 2016-10-31 NOTE — Progress Notes (Signed)
PULMONARY / CRITICAL CARE MEDICINE   Name: AMADO RICHCREEK MRN: [id number] DOB: 09-28-1946    ADMISSION DATE:  10/20/2016 CONSULTATION DATE: 12/1  REFERRING MD: Ricard Dillon  CHIEF COMPLAINT:  CAD  HISTORY OF PRESENT ILLNESS:   70 yo former smoker with known CAD and ascending aorta aneurysm, who was taken to OR 11/30 for cabg x 3, aortic root replacement, MVR replacement, thoracic cavity left open and had extended pump time in OR. On APRV ventilation post op, CI 1.6 and multiple pressors. PCCM asked to manage vent.  SUBJECTIVE:  Fevers persist  Pos bal 2L , lasix restarted this am per CTS  Weaned some this am   VITAL SIGNS: BP (!) 82/57   Pulse 97   Temp 99.3 F (37.4 C)   Resp 14   Ht 5\' 9"  (1.753 m)   Wt 117.8 kg (259 lb 11.2 oz)   SpO2 96%   BMI 38.35 kg/m   HEMODYNAMICS: CVP:  [12 mmHg-22 mmHg] 20 mmHg  VENTILATOR SETTINGS: Vent Mode: CPAP;PSV FiO2 (%):  [40 %] 40 % Set Rate:  [12 bmp] 12 bmp Vt Set:  [570 mL] 570 mL PEEP:  [5 cmH20] 5 cmH20 Pressure Support:  [5 cmH20-8 cmH20] 8 cmH20 Plateau Pressure:  [16 cmH20-18 cmH20] 16 cmH20  INTAKE / OUTPUT: I/O last 3 completed shifts: In: 10597.3 [I.V.:7182.3; NG/GT:2120; IV Piggyback:1295] Out: 9760 [Urine:8960; Chest Tube:800]  PHYSICAL EXAMINATION: General: sedated on vent, rass deeper -3 Neuro: rass -3 HEENT: jvd remains, ett wnl Cardiovascular: s1 s2 rrr Lungs:  Coarse improved Abdomen:  Soft , BS low, no r Musculoskeletal:  Intact, equal cold mid foot  Skin: livido unchaged ant foot  LABS:  BMET  Recent Labs Lab 10/30/16 0410 10/30/16 1531 10/30/16 2000 10/31/16 0454  NA 148*  --  146* 145  K 3.1* 3.9 4.6 3.9  CL 106  --  110 108  CO2 31  --  27 28  BUN 57*  --  43* 43*  CREATININE 1.04  --  0.81 0.77  GLUCOSE 176*  --  218* 299*    Electrolytes  Recent Labs Lab 10/25/16 0500 10/26/16 0400 10/26/16 1635 10/27/16 0500 10/27/16 1701  10/30/16 0410 10/30/16 2000 10/31/16 0454   CALCIUM 7.8* 7.8* 7.6* 7.5*  --   < > 7.5* 7.4* 7.3*  MG 1.7 1.8 2.6* 2.5* 2.6*  --   --   --   --   PHOS 1.9* 1.7*  --   --   --   --   --   --   --   < > = values in this interval not displayed.  CBC  Recent Labs Lab 10/29/16 0300 10/30/16 0410 10/31/16 0454  WBC 10.0 17.1* 14.3*  HGB 10.6* 11.2* 10.9*  HCT 32.0* 33.8* 34.8*  PLT 180 238 226    Coag's  Recent Labs Lab 10/29/16 0300 10/30/16 0410 10/31/16 0454  APTT 65* 61* 85*  INR 2.63 2.29 4.34*    Sepsis Markers  Recent Labs Lab 10/27/16 0900 10/28/16 0500 10/29/16 0930 10/29/16 1628  LATICACIDVEN  --   --  2.0* 2.0*  PROCALCITON 4.56 4.33  --   --     ABG  Recent Labs Lab 10/28/16 0500 10/28/16 2305 10/29/16 0417  PHART 7.468* 7.514* 7.475*  PCO2ART 39.0 33.4 36.8  PO2ART 97.0 90.0 90.0    Liver Enzymes  Recent Labs Lab 10/29/16 0300 10/30/16 0410 10/31/16 0454  AST 875* 386* 122*  ALT 582* 478* 294*  ALKPHOS 160* 141* 127*  BILITOT 1.6* 1.5* 1.4*  ALBUMIN 2.1* 2.1* 1.7*    Cardiac Enzymes No results for input(s): TROPONINI, PROBNP in the last 168 hours.  Glucose  Recent Labs Lab 10/30/16 1531 10/30/16 1944 10/30/16 2349 10/31/16 0343 10/31/16 0741 10/31/16 1139  GLUCAP 162* 147* 159* 199* 298* 286*    Imaging Dg Chest Port 1 View  Result Date: 10/31/2016 CLINICAL DATA:  Atelectasis. EXAM: PORTABLE CHEST 1 VIEW COMPARISON:  10/29/2016 FINDINGS: Median sternotomy wires unchanged. Endotracheal tube with tip 5.4 cm above the carina. Nasogastric tube courses into the stomach and off the inferior portion of the film. Left-sided PICC line has tip obliquely oriented over the SVC. Bilateral chest tubes unchanged. Lungs are adequately inflated with minimal stable perihilar bibasilar opacification likely atelectasis. No significant effusion. No pneumothorax. Stable cardiomegaly. Remainder of the exam is unchanged. IMPRESSION: Mild stable opacification in the perihilar bibasilar  regions likely atelectasis. Stable cardiomegaly. Tubes and lines as described. Electronically Signed   By: Marin Olp M.D.   On: 10/31/2016 08:18    STUDIES:  12.5 dopplers lowers>>>neg 12/6 dopplers arms >>>neg 12/7 abdo us>>>Cholelithiasis without sonographic evidence for acute cholecystitis. No biliary dilatation.Increased hepatic echogenicity, suggestive of steatosis. Ascites. 12/7 echo>>>60-65%, rv poor images but appeared wnl  CULTURES: 12-1 bc >>> 12/4 BC x 4>>> 12/6 BC>>>  ANTIBIOTICS: 11/30 levaquin>>off 12/3 12/3 ceftriaxone>>>12/7 12/3 vanc>>> 12/7 meropenem>>> 12/7 eraxis>>>  SIGNIFICANT EVENTS: 11/30 surgery, hypoxia 12/4 closed chest 12/7-high fevers, LFt up  LINES/TUBES: 11.30 OTT>> 11/30 rt I j PA cath>>12/4 picc left 12/4>>> 11/30 l rad aline>>12/7 11/30 CT x 4>> 11/30 wound vac>>12/4   ASSESSMENT / PLAN:  PULMONARY A: Acute hypoxic resp failure ALI? OHS Former smoker Chest closed 12/4 Presumed PE ( Rt atrial clot) ALI Pulm edema P:   Neg balance obtained, no longer needed Weaning cpap 5 ps 5, goal 2 hours, assess rsbi, unlikely to extubate until neurostatus improves and resolution Temp curve and etiology pcxr in am   CARDIOVASCULAR A:  Post cabg x 3, MVR, ascending aortic root repalcement Hypotension resolved Fib CAD Rt atrial clot Rt lower ext ischemia likely ( exam equal however, I think I got a signal doppler myself) Hypotension, hypovolemia P:  Amio off, may be able to restart in future with resolving LFT Anticoagulation maintain Echo for RV- reassuring Get art survey-pending, give volume further  RENAL Lab Results  Component Value Date   CREATININE 0.77 10/31/2016   CREATININE 0.81 10/30/2016   CREATININE 1.04 10/30/2016   CREATININE 0.87 10/19/2016    A:   HypoK mild Hypernatremia-improving  P:   Chem in am Lasix to off, agree, appears dry now K supp Decrease D5w 75cc   GASTROINTESTINAL A:   Hx of  Gerd Shock liver R/o contribution LFt from lower ext ischemia LFT rise, r/o portal vein thrombosis P:   PPI LFT in am , downward trend noted Cpk neg ldh tr down  Keep low peptid formulation, tolerating full rate  US done- no acute chole, prob steatosis , ascites   HEMATOLOGIC  Recent Labs  10/30/16 0410 10/31/16 0454  HGB 11.2* 10.9*    A:   Thrombocytopenia - agree with heme, DIC likely was cause ELISA neg 12/4, SRA neg hitt RA clot Digital ischemia hemoconcentration 12/8 wbc P:  Amgiomax, per pharmacy- elisa neg,  HITT pending, SRA neg scd Follow finger and toes for de margination   INFECTIOUS A:   R/o endocarditis Fever 104 12/6 (lower ext ischemia?)  At risk endovascular infection Weaning some , CXR stable - unlikely HCAP P:   Would mainatin current regimen, follow fever curve further Echo repeat- no veg apparent, only tte ua - neg 12/7  ENDOCRINE CBG (last 3)   Recent Labs  10/31/16 0343 10/31/16 0741 10/31/16 1139  GLUCAP 199* 298* 286*     A:   DM On d5 Slight rise P:   SSI Increase lantus 10   NEUROLOGIC A:   Post OHS with prolonged pump time 11/30 Awake and follows commands 12/1 Encephalopathy  / oversedation- improved P:   RASS goal: 0 WUA fent precedex   FAMILY  - Updates: I updated daughter at bedside 12/5, 12/6, 12/7 and wife  - Inter-disciplinary family meet or Palliative Care meeting due by: 12/8  Arris Meyn NP-C  [facility]  10/31/2016

## 2016-10-31 NOTE — Progress Notes (Signed)
Pt placed back on full vent support due to decreased RR, VT, and MVe

## 2016-10-31 NOTE — Progress Notes (Signed)
ANTICOAGULATION CONSULT NOTE - Follow Up Consult  Pharmacy Consult for Bivalirudin Indication: possible clot in RA and afib  Allergies  Allergen Reactions  . Doxycycline Hives  . Penicillins Hives     Has patient had a PCN reaction causing immediate rash, facial/tongue/throat swelling, SOB or lightheadedness with hypotension: No Has patient had a PCN reaction causing severe rash involving mucus membranes or skin necrosis: No Has patient had a PCN reaction that required hospitalization: No Has patient had a PCN reaction occurring within the last 10 years: Yes If all of the above answers are "NO", then may proceed with Cephalosporin use.   . Sulfa Antibiotics Hives    Patient Measurements: Height: 5\' 9"  (175.3 cm) Weight: 259 lb 11.2 oz (117.8 kg) IBW/kg (Calculated) : 70.7  Vital Signs: Temp: 100.4 F (38 C) (12/09 1130) Temp Source: Core (Comment) (12/09 0340) BP: 88/71 (12/09 1226) Pulse Rate: 129 (12/09 1226)  Labs:  Recent Labs  10/29/16 0300 10/29/16 0930  10/30/16 0410 10/30/16 2000 10/31/16 0454  HGB 10.6*  --   --  11.2*  --  10.9*  HCT 32.0*  --   --  33.8*  --  34.8*  PLT 180  --   --  238  --  226  APTT 65*  --   --  61*  --  85*  LABPROT 28.6*  --   --  25.6*  --  42.7*  INR 2.63  --   --  2.29  --  4.34*  CREATININE 1.34*  --   < > 1.04 0.81 0.77  CKTOTAL  --  212  --   --   --   --   < > = values in this interval not displayed.  Estimated Creatinine Clearance: 108.8 mL/min (by C-G formula based on SCr of 0.77 mg/dL).   Medications:  Bivalirudin @ 0.07mg /kg/hr  Assessment: 70yom continues on bivalirudin for possible right atrial clot and afib. APTT is therapeutic at 85 seconds. HIT ruled out (low heparin antibody and SRA negative). Heparin allergy removed. Platelets stable 226. INR 4.34 but likely falsely elevated from effects of bivalirudin.  Goal of Therapy:  aPTT 50-85 seconds Monitor platelets by anticoagulation protocol: Yes   Plan:  1)  Continue bivalirudin @ 0.07mg /kg/hr 2) Daily APTT 3) Follow up oral anticoagulation  [name] 10/31/2016,12:46 PM

## 2016-10-31 NOTE — Progress Notes (Signed)
5 Days Post-Op Procedure(s) (LRB): STERNAL WASHOUT AND DELAYED PRIMARY CLOSURE (N/A) TRANSESOPHAGEAL ECHOCARDIOGRAM (TEE) (N/A) Subjective: Febrile 102- on CPAP/PS with tachypnea cvp high after lasix stropped - will start 40/day Objective: Vital signs in last 24 hours: Temp:  [97.5 F (36.4 C)-102 F (38.9 C)] 101.8 F (38.8 C) (12/09 0930) Pulse Rate:  [68-140] 115 (12/09 0930) Cardiac Rhythm: Atrial fibrillation (12/09 0756) Resp:  [12-24] 22 (12/09 0930) BP: (81-148)/(52-117) 89/65 (12/09 0930) SpO2:  [92 %-100 %] 96 % (12/09 0930) FiO2 (%):  [40 %] 40 % (12/09 0811) Weight:  [259 lb 11.2 oz (117.8 kg)] 259 lb 11.2 oz (117.8 kg) (12/09 0354)  Hemodynamic parameters for last 24 hours: CVP:  [12 mmHg-22 mmHg] 20 mmHg  Intake/Output from previous day: 12/08 0701 - 12/09 0700 In: 6616.3 [I.V.:5031.3; NG/GT:1320; IV Piggyback:265] Out: [id number] [Urine:4310; Chest Tube:420] Intake/Output this shift: Total I/O In: 530.6 [I.V.:420.6; NG/GT:110] Out: 450 [Urine:250; Chest Tube:200]  Hands mottled Coarse breath sounds abd non tender Lab Results:  Recent Labs  10/30/16 0410 10/31/16 0454  WBC 17.1* 14.3*  HGB 11.2* 10.9*  HCT 33.8* 34.8*  PLT 238 226   BMET:  Recent Labs  10/30/16 2000 10/31/16 0454  NA 146* 145  K 4.6 3.9  CL 110 108  CO2 27 28  GLUCOSE 218* 299*  BUN 43* 43*  CREATININE 0.81 0.77  CALCIUM 7.4* 7.3*    PT/INR:  Recent Labs  10/31/16 0454  LABPROT 42.7*  INR 4.34*   ABG    Component Value Date/Time   PHART 7.475 (H) 10/29/2016 0417   HCO3 26.6 10/29/2016 0417   TCO2 28 10/29/2016 0417   ACIDBASEDEF 1.0 10/27/2016 1125   O2SAT 61.1 10/31/2016 0450   CBG (last 3)   Recent Labs  10/30/16 2349 10/31/16 0343 10/31/16 0741  GLUCAP 159* 199* 298*    Assessment/Plan: S/P Procedure(s) (LRB): STERNAL WASHOUT AND DELAYED PRIMARY CLOSURE (N/A) TRANSESOPHAGEAL ECHOCARDIOGRAM (TEE) (N/A) Not ready for extubation Re-culture INR > 4 -  will repeat in am and cont bival at .07, PTT therapeutic Chest tubes draining - leave in place  LOS: 11 days    Scott [name] 10/31/2016

## 2016-10-31 NOTE — Progress Notes (Signed)
CRITICAL VALUE ALERT  Critical value received:  INR 4.34  Date of notification:  10/31/16  Time of notification: [id number]  Critical value read back:Yes.    Nurse who received alert:  Governor Rooks  MD notified (1st page)E. Deterding  Time of first page: 0540  MD notified (2nd page):  Time of second page:  Responding MD: E. Deterding  Time MD responded: [id number]  No new orders at this time. Will continue to monitor. Lianne Bushy RN BSN

## 2016-11-01 ENCOUNTER — Inpatient Hospital Stay (HOSPITAL_COMMUNITY): Payer: Medicare Other

## 2016-11-01 DIAGNOSIS — D696 Thrombocytopenia, unspecified: Secondary | ICD-10-CM

## 2016-11-01 DIAGNOSIS — R509 Fever, unspecified: Secondary | ICD-10-CM

## 2016-11-01 LAB — COMPREHENSIVE METABOLIC PANEL
ALBUMIN: 2.1 g/dL — AB (ref 3.5–5.0)
ALK PHOS: 119 U/L (ref 38–126)
ALT: 184 U/L — ABNORMAL HIGH (ref 17–63)
ANION GAP: 8 (ref 5–15)
AST: 73 U/L — ABNORMAL HIGH (ref 15–41)
BUN: 38 mg/dL — ABNORMAL HIGH (ref 6–20)
CALCIUM: 7.8 mg/dL — AB (ref 8.9–10.3)
CO2: 29 mmol/L (ref 22–32)
Chloride: 107 mmol/L (ref 101–111)
Creatinine, Ser: 0.79 mg/dL (ref 0.61–1.24)
GFR calc Af Amer: >60 mL/min (ref >60)
GFR calc non Af Amer: >60 mL/min (ref >60)
GLUCOSE: 159 mg/dL — AB (ref 65–99)
Potassium: 4.3 mmol/L (ref 3.5–5.1)
Sodium: 144 mmol/L (ref 135–145)
TOTAL PROTEIN: 5.2 g/dL — AB (ref 6.5–8.1)
Total Bilirubin: 1.5 mg/dL — ABNORMAL HIGH (ref 0.3–1.2)

## 2016-11-01 LAB — GLUCOSE, CAPILLARY
GLUCOSE-CAPILLARY: 232 mg/dL — AB (ref 65–99)
GLUCOSE-CAPILLARY: 232 mg/dL — AB (ref 65–99)
Glucose-Capillary: 151 mg/dL — ABNORMAL HIGH (ref 65–99)
Glucose-Capillary: 171 mg/dL — ABNORMAL HIGH (ref 65–99)
Glucose-Capillary: 176 mg/dL — ABNORMAL HIGH (ref 65–99)
Glucose-Capillary: 188 mg/dL — ABNORMAL HIGH (ref 65–99)

## 2016-11-01 LAB — PROTIME-INR
INR: 2.4
Prothrombin Time: 26.6 seconds — ABNORMAL HIGH (ref 11.4–15.2)

## 2016-11-01 LAB — COOXEMETRY PANEL
CARBOXYHEMOGLOBIN: 1.6 % — AB (ref 0.5–1.5)
Methemoglobin: 0.9 % (ref 0.0–1.5)
O2 SAT: 66.6 %
TOTAL HEMOGLOBIN: 10.2 g/dL — AB (ref 12.0–16.0)

## 2016-11-01 LAB — PHOSPHORUS: PHOSPHORUS: 3.1 mg/dL (ref 2.5–4.6)

## 2016-11-01 LAB — CBC
HCT: 33.4 % — ABNORMAL LOW (ref 39.0–52.0)
HEMOGLOBIN: 10.4 g/dL — AB (ref 13.0–17.0)
MCH: 29.7 pg (ref 26.0–34.0)
MCHC: 31.1 g/dL (ref 30.0–36.0)
MCV: 95.4 fL (ref 78.0–100.0)
PLATELETS: 236 10*3/uL (ref 150–400)
RBC: 3.5 MIL/uL — ABNORMAL LOW (ref 4.22–5.81)
RDW: 17 % — ABNORMAL HIGH (ref 11.5–15.5)
WBC: 13.9 10*3/uL — ABNORMAL HIGH (ref 4.0–10.5)

## 2016-11-01 LAB — CORTISOL-AM, BLOOD: Cortisol - AM: 17.9 ug/dL (ref 6.7–22.6)

## 2016-11-01 LAB — MAGNESIUM: Magnesium: 2.2 mg/dL (ref 1.7–2.4)

## 2016-11-01 LAB — APTT: APTT: 65 s — AB (ref 24–36)

## 2016-11-01 MED ORDER — ALBUMIN HUMAN 5 % IV SOLN
12.5000 g | Freq: Once | INTRAVENOUS | Status: AC
  Administered 2016-11-01: 12.5 g via INTRAVENOUS
  Filled 2016-11-01: qty 250

## 2016-11-01 MED ORDER — ACYCLOVIR 200 MG/5ML PO SUSP
200.0000 mg | Freq: Four times a day (QID) | ORAL | Status: DC
  Administered 2016-11-01 – 2016-11-14 (×53): 200 mg via ORAL
  Filled 2016-11-01 (×55): qty 5

## 2016-11-01 MED ORDER — SODIUM CHLORIDE 0.9 % IV SOLN
25.0000 ug/h | INTRAVENOUS | Status: DC
  Administered 2016-11-02 – 2016-11-03 (×7): 400 ug/h via INTRAVENOUS
  Administered 2016-11-04: 200 ug/h via INTRAVENOUS
  Administered 2016-11-04: 125 ug/h via INTRAVENOUS
  Administered 2016-11-04 – 2016-11-05 (×3): 200 ug/h via INTRAVENOUS
  Administered 2016-11-05: 150 ug/h via INTRAVENOUS
  Administered 2016-11-06: 275 ug/h via INTRAVENOUS
  Administered 2016-11-06: 300 ug/h via INTRAVENOUS
  Administered 2016-11-06 – 2016-11-07 (×2): 200 ug/h via INTRAVENOUS
  Administered 2016-11-07 – 2016-11-08 (×3): 300 ug/h via INTRAVENOUS
  Administered 2016-11-09: 250 ug/h via INTRAVENOUS
  Administered 2016-11-09 – 2016-11-10 (×4): 300 ug/h via INTRAVENOUS
  Administered 2016-11-11: 250 ug/h via INTRAVENOUS
  Administered 2016-11-11: 275 ug/h via INTRAVENOUS
  Administered 2016-11-12: 175 ug/h via INTRAVENOUS
  Filled 2016-11-01 (×31): qty 50

## 2016-11-01 MED ORDER — CALCIUM CHLORIDE 10 % IV SOLN
1.0000 g | Freq: Once | INTRAVENOUS | Status: AC
Start: 2016-11-01 — End: 2016-11-01
  Administered 2016-11-01: 1 g via INTRAVENOUS
  Filled 2016-11-01: qty 10

## 2016-11-01 MED ORDER — SODIUM CHLORIDE 0.9 % IV SOLN
25.0000 ug/h | INTRAVENOUS | Status: DC
  Filled 2016-11-01 (×3): qty 50

## 2016-11-01 MED ORDER — INSULIN GLARGINE 100 UNIT/ML ~~LOC~~ SOLN
15.0000 [IU] | Freq: Two times a day (BID) | SUBCUTANEOUS | Status: DC
  Administered 2016-11-01 – 2016-11-10 (×19): 15 [IU] via SUBCUTANEOUS
  Filled 2016-11-01 (×20): qty 0.15

## 2016-11-01 NOTE — Progress Notes (Signed)
6 Days Post-Op Procedure(s) (LRB): STERNAL WASHOUT AND DELAYED PRIMARY CLOSURE (N/A) TRANSESOPHAGEAL ECHOCARDIOGRAM (TEE) (N/A) Subjective: Received sedation for tachypnea during PS vent wean LFTs cont yo improve of amio ID rec stopping vanc soon with improving temp curve , no evidence endocarditis CCM says no extubation due to neuro status LLE now w/o doppler pulse, + fem pulse -cont bival, asa  Objective: Vital signs in last 24 hours: Temp:  [95 F (35 C)-100.8 F (38.2 C)] 99.3 F (37.4 C) (12/10 1107) Pulse Rate:  [71-135] 108 (12/10 0930) Cardiac Rhythm: Atrial fibrillation (12/10 0800) Resp:  [0-30] 0 (12/10 0930) BP: (79-154)/(55-110) 101/66 (12/10 0930) SpO2:  [86 %-100 %] 99 % (12/10 0930) FiO2 (%):  [40 %] 40 % (12/10 0829)  Hemodynamic parameters for last 24 hours: CVP:  [14 mmHg-19 mmHg] 19 mmHg  Intake/Output from previous day: 12/09 0701 - 12/10 0700 In: 6209.7 [I.V.:4344.7; NG/GT:1015; IV Piggyback:850] Out: [id number] [Urine:2900; Chest Tube:990] Intake/Output this shift: Total I/O In: 408.3 [I.V.:308.3; NG/GT:100] Out: -   Sedated- was previously alert Perineal edema Cyanosis of extremities slowly progressing  Lab Results:  Recent Labs  10/31/16 0454 11/01/16 0415  WBC 14.3* 13.9*  HGB 10.9* 10.4*  HCT 34.8* 33.4*  PLT 226 236   BMET:  Recent Labs  10/31/16 0454 11/01/16 0415  NA 145 144  K 3.9 4.3  CL 108 107  CO2 28 29  GLUCOSE 299* 159*  BUN 43* 38*  CREATININE 0.77 0.79  CALCIUM 7.3* 7.8*    PT/INR:  Recent Labs  11/01/16 0415  LABPROT 26.6*  INR 2.40   ABG    Component Value Date/Time   PHART 7.475 (H) 10/29/2016 0417   HCO3 26.6 10/29/2016 0417   TCO2 28 10/29/2016 0417   ACIDBASEDEF 1.0 10/27/2016 1125   O2SAT 66.6 11/01/2016 0412   CBG (last 3)   Recent Labs  11/01/16 0412 11/01/16 0811 11/01/16 1109  GLUCAP 151* 232* 232*    Assessment/Plan: S/P Procedure(s) (LRB): STERNAL WASHOUT AND DELAYED PRIMARY  CLOSURE (N/A) TRANSESOPHAGEAL ECHOCARDIOGRAM (TEE) (N/A) Cont tube feed, bival. INR 2.4 Chest tube drainage decreasing cortisol level wnl   LOS: 12 days    Tharon Aquas Trigt III 11/01/2016

## 2016-11-01 NOTE — Progress Notes (Signed)
Pt placed on full vent support due to increased RR & WOB.

## 2016-11-01 NOTE — Progress Notes (Signed)
PULMONARY / CRITICAL CARE MEDICINE   Name: Scott [name] MRN: [id number] DOB: 10-Oct-1946    ADMISSION DATE:  10/20/2016 CONSULTATION DATE: 12/1  REFERRING MD: Ricard Dillon  CHIEF COMPLAINT:  CAD  HISTORY OF PRESENT ILLNESS:   70 yo former smoker with known CAD and ascending aorta aneurysm, who was taken to OR 11/30 for cabg x 3, aortic root replacement, MVR replacement, thoracic cavity left open and had extended pump time in OR. On APRV ventilation post op, CI 1.6 and multiple pressors. PCCM asked to manage vent.  SUBJECTIVE:  Tachycardic, quickly tachypneic on SBT this am  Unable to doppler pulses LLE Weaning pressors   VITAL SIGNS: BP 101/66   Pulse (!) 108   Temp 99.3 F (37.4 C)   Resp (!) 0   Ht 5\' 9"  (1.753 m)   Wt 117.8 kg (259 lb 11.2 oz)   SpO2 99%   BMI 38.35 kg/m   HEMODYNAMICS: CVP:  [14 mmHg-20 mmHg] 19 mmHg  VENTILATOR SETTINGS: Vent Mode: CPAP;PSV FiO2 (%):  [40 %] 40 % Set Rate:  [12 bmp] 12 bmp Vt Set:  [570 mL] 570 mL PEEP:  [5 cmH20] 5 cmH20 Pressure Support:  [5 cmH20-8 cmH20] 5 cmH20 Plateau Pressure:  [16 cmH20-19 cmH20] 19 cmH20  INTAKE / OUTPUT: I/O last 3 completed shifts: In: 9498.6 [I.V.:6973.6; NG/GT:1675; IV [id number] Out: [id number] [Urine:5260; Chest Tube:1210]  PHYSICAL EXAMINATION: General: ill appearing, NAD  Neuro: rass -1, follows some commands, intermittent agitation  HEENT:  ett wnl Cardiovascular: s1 s2 rrr Lungs:  resps even, tachypneic on PS 5/5, few scattered rhonchi  Abdomen:  Soft , BS low, no r Musculoskeletal:  Intact, feet cool, dusky    LABS:  BMET  Recent Labs Lab 10/30/16 2000 10/31/16 0454 11/01/16 0415  NA 146* 145 144  K 4.6 3.9 4.3  CL 110 108 107  CO2 27 28 29   BUN 43* 43* 38*  CREATININE 0.81 0.77 0.79  GLUCOSE 218* 299* 159*    Electrolytes  Recent Labs Lab 10/26/16 0400  10/27/16 0500 10/27/16 1701  10/30/16 2000 10/31/16 0454 11/01/16 0415  CALCIUM 7.8*  < > 7.5*  --   < > 7.4*  7.3* 7.8*  MG 1.8  < > 2.5* 2.6*  --   --   --  2.2  PHOS 1.7*  --   --   --   --   --   --  3.1  < > = values in this interval not displayed.  CBC  Recent Labs Lab 10/30/16 0410 10/31/16 0454 11/01/16 0415  WBC 17.1* 14.3* 13.9*  HGB 11.2* 10.9* 10.4*  HCT 33.8* 34.8* 33.4*  PLT 238 226 236    Coag's  Recent Labs Lab 10/30/16 0410 10/31/16 0454 11/01/16 0415  APTT 61* 85* 65*  INR 2.29 4.34* 2.40    Sepsis Markers  Recent Labs Lab 10/27/16 0900 10/28/16 0500 10/29/16 0930 10/29/16 1628  LATICACIDVEN  --   --  2.0* 2.0*  PROCALCITON 4.56 4.33  --   --     ABG  Recent Labs Lab 10/28/16 0500 10/28/16 2305 10/29/16 0417  PHART 7.468* 7.514* 7.475*  PCO2ART 39.0 33.4 36.8  PO2ART 97.0 90.0 90.0    Liver Enzymes  Recent Labs Lab 10/30/16 0410 10/31/16 0454 11/01/16 0415  AST 386* 122* 73*  ALT 478* 294* 184*  ALKPHOS 141* 127* 119  BILITOT 1.5* 1.4* 1.5*  ALBUMIN 2.1* 1.7* 2.1*    Cardiac Enzymes No results for input(s):  TROPONINI, PROBNP in the last 168 hours.  Glucose  Recent Labs Lab 10/31/16 1139 10/31/16 1602 10/31/16 2008 11/01/16 0013 11/01/16 0412 11/01/16 0811  GLUCAP 286* 167* 195* 171* 151* 232*    Imaging Dg Chest Port 1 View  Result Date: 11/01/2016 CLINICAL DATA:  Fever EXAM: PORTABLE CHEST 1 VIEW COMPARISON:  10/31/2016 FINDINGS: Endotracheal tube in good position. Bilateral chest tubes in place. No pneumothorax. Central venous catheter tip in the proximal SVC unchanged. Bibasilar atelectasis unchanged.  No effusion.  Negative for edema. IMPRESSION: Bibasilar airspace disease unchanged. Support lines in good position. No pneumothorax Electronically Signed   By: Franchot Gallo M.D.   On: 11/01/2016 07:31    STUDIES:  12.5 dopplers lowers>>>neg 12/6 dopplers arms >>>neg 12/7 abdo us>>>Cholelithiasis without sonographic evidence for acute cholecystitis. No biliary dilatation.Increased hepatic echogenicity, suggestive  of steatosis. Ascites. 12/7 echo>>>60-65%, rv poor images but appeared wnl  CULTURES: 12-1 bc >>>neg  12/7 sputum>>> normal flora  12/4 BC x 4>>>neg 12/7 BC>>>    ANTIBIOTICS: 11/30 levaquin>>off 12/3 12/3 ceftriaxone>>>12/7 12/3 vanc>>> 12/7 meropenem>>>off 12/7 eraxis>>>off  SIGNIFICANT EVENTS: 11/30 surgery, hypoxia 12/4 closed chest 12/7-high fevers, LFt up  LINES/TUBES: 11.30 OTT>> 11/30 rt I j PA cath>>12/4 picc left 12/4>>> 11/30 l rad aline>>12/7 11/30 CT x 4>> 11/30 wound vac>>12/4   ASSESSMENT / PLAN:  PULMONARY A: Acute hypoxic resp failure ALI? OHS Former smoker Chest closed 12/4 Presumed PE ( Rt atrial clot) ALI Pulm edema P:   Vent support - 8cc/kg  F/u CXR  F/u ABG Daily SBT  unlikely to extubate until neurostatus improves Diuresis as tol - monitor pressor needs   CARDIOVASCULAR A:  Post cabg x 3, MVR, ascending aortic root repalcement Hypotension resolved Fib CAD Rt atrial clot Rt lower ext ischemia likely ( exam equal however, I think I got a signal doppler myself) Hypotension, hypovolemia P:  Per CVTS  Amio off, may be able to restart in future with resolving LFT Anticoagulation - angiomax    RENAL A:   HypoK mild Hypernatremia-improving  P:   Chem in am Monitor I/o with diuresis  K supp Decrease D5w 38ml/hr  GASTROINTESTINAL A:   Hx of Gerd Shock liver - RUQ without biliary obstruction or lesion. R/o contribution LFt from lower ext ischemia P:   PPI Monitor LFT's  TF   HEMATOLOGIC A:   Thrombocytopenia - agree with heme, DIC likely was cause ELISA neg 12/4, SRA neg hitt RA clot Digital ischemia hemoconcentration 12/8 wbc P:  Amgiomax, per pharmacy- elisa neg,  HITT pending, SRA neg scd F/u CBC  Follow finger and toes for de margination   INFECTIOUS A:   R/o endocarditis Fever 104 12/6 (lower ext ischemia?) At risk endovascular infection Weaning some , CXR stable - unlikely HCAP P:   ID  following - meropenem and anidulafungin stopped, remains on vanc  Trend fever, wbc curve   ENDOCRINE   DM On d5 Slight rise P:   SSI lantus 10   NEUROLOGIC A:   Post OHS with prolonged pump time 11/30 Awake and follows commands 12/1 Encephalopathy  / oversedation- improved P:   RASS goal: 0 WUA fent precedex   FAMILY  - Updates: no family available 12/10  - Inter-disciplinary family meet or Palliative Care meeting due by: 12/8   Nickolas Madrid, NP 11/01/2016  10:11 AM Pager: [phone] or [phone]

## 2016-11-01 NOTE — Progress Notes (Signed)
ANTICOAGULATION CONSULT NOTE - Follow Up Consult ANTIBIOTIC CONSULT NOTE - Follow Up Consult  Pharmacy Consult for Bivalirudin and Vancomycin Indication: possible clot in RA and afib and fever  Allergies  Allergen Reactions  . Doxycycline Hives  . Penicillins Hives     Has patient had a PCN reaction causing immediate rash, facial/tongue/throat swelling, SOB or lightheadedness with hypotension: No Has patient had a PCN reaction causing severe rash involving mucus membranes or skin necrosis: No Has patient had a PCN reaction that required hospitalization: No Has patient had a PCN reaction occurring within the last 10 years: Yes If all of the above answers are "NO", then may proceed with Cephalosporin use.   . Sulfa Antibiotics Hives    Patient Measurements: Height: 5\' 9"  (175.3 cm) Weight: 259 lb 11.2 oz (117.8 kg) IBW/kg (Calculated) : 70.7  Vital Signs: Temp: 99.3 F (37.4 C) (12/10 0930) Temp Source: Core (Comment) (12/10 0400) BP: 101/66 (12/10 0930) Pulse Rate: 108 (12/10 0930)  Labs:  Recent Labs  10/30/16 0410 10/30/16 2000 10/31/16 0454 11/01/16 0415  HGB 11.2*  --  10.9* 10.4*  HCT 33.8*  --  34.8* 33.4*  PLT 238  --  226 236  APTT 61*  --  85* 65*  LABPROT 25.6*  --  42.7* 26.6*  INR 2.29  --  4.34* 2.40  CREATININE 1.04 0.81 0.77 0.79    Estimated Creatinine Clearance: 108.8 mL/min (by C-G formula based on SCr of 0.79 mg/dL).   Medications:  Bivalirudin @ 0.07mg /kg/hr  Assessment: 70yom continues on bivalirudin for possible right atrial clot and afib. APTT is therapeutic at 65 seconds. HIT ruled out (low heparin antibody and SRA negative). Heparin allergy removed. Platelets stable 236. INR back down to 2.4 from 4.34 - likely falsely elevated from effects of bivalirudin.  He also continues on day # 4 vancomycin for fever of unknown origin. ID following and discontinued meropenem and anidulafungin. Renal function has remained stable.   Antimicrobials  this admission:  Vancomycin 12/1 x 1 dose, 12/4>> Rifampin 12/5 x 1 dose - [facility] dc'd 2nd incr LFTs Levaquin 11/30>>12/4 CTX 12/4>>12/7 Merrem 12/7 >>12/9 Anidulafungin 12/7>> 12/9  Dose adjustments this admission:  12/6 Vancomycin dose changed 12/6 due to renal fxn  Microbiology results:  12/1 BCx>>negative final 11/29 MRSA PCR: negative 12/4 BC x2>>negative final 12/7 BCx2>>ngtd 12/7 Resp>>normal flora 12/9 Resp>> rare gram negative coccobacilli 12/9 BCx1 >>  Goal of Therapy:  Vancomycin goal trough 15-20 aPTT 50-85 seconds Monitor platelets by anticoagulation protocol: Yes   Plan:  1) Continue bivalirudin @ 0.07mg /kg/hr 2) Daily APTT 3) Follow up oral anticoagulation 4) Continue vancomycin 1250mg  IV q24 - hold off on trough as likely to be d/c'ed soon  [name] 11/01/2016,10:49 AM

## 2016-11-01 NOTE — Progress Notes (Signed)
CT surgery p.m. Rounds  Patient resting on ventilator, controlled atrial fibrillation stable blood pressure Temperature elevated 38.1 Adequate urine output Neo-Synephrine weaned to less than 10 g Cyanosis-gangrene of hands slowly progressing despite therapeutic bivalirudin, aspirin, INR 2.5

## 2016-11-01 NOTE — Progress Notes (Signed)
Patient ID: Scott [name], male   DOB: 04/06/1946, 70 y.o.   MRN: [id number]          [facility] for Infectious Disease    Date of Admission:  10/20/2016   Day 7 vancomycin         All blood and sputum cultures have been negative. I doubt that he has endocarditis complicating his recent valve replacement surgery. Because of his postoperative fevers is unclear but I suspect that is noninfectious. His temperature curve is trending down. I would consider stopping vancomycin soon.       Michel Bickers, MD [facility] [phone] pager   [phone] cell 11/26/2015, 1:32 PM

## 2016-11-02 ENCOUNTER — Inpatient Hospital Stay (HOSPITAL_COMMUNITY): Payer: Medicare Other

## 2016-11-02 DIAGNOSIS — I513 Intracardiac thrombosis, not elsewhere classified: Secondary | ICD-10-CM

## 2016-11-02 DIAGNOSIS — R509 Fever, unspecified: Secondary | ICD-10-CM

## 2016-11-02 DIAGNOSIS — I96 Gangrene, not elsewhere classified: Secondary | ICD-10-CM

## 2016-11-02 LAB — COMPREHENSIVE METABOLIC PANEL
ALK PHOS: 115 U/L (ref 38–126)
ALT: 114 U/L — AB (ref 17–63)
AST: 51 U/L — AB (ref 15–41)
Albumin: 2.3 g/dL — ABNORMAL LOW (ref 3.5–5.0)
Anion gap: 6 (ref 5–15)
BILIRUBIN TOTAL: 1.5 mg/dL — AB (ref 0.3–1.2)
BUN: 37 mg/dL — AB (ref 6–20)
CALCIUM: 8.1 mg/dL — AB (ref 8.9–10.3)
CO2: 30 mmol/L (ref 22–32)
CREATININE: 0.69 mg/dL (ref 0.61–1.24)
Chloride: 107 mmol/L (ref 101–111)
GFR calc Af Amer: >60 mL/min (ref >60)
Glucose, Bld: 179 mg/dL — ABNORMAL HIGH (ref 65–99)
Potassium: 4.5 mmol/L (ref 3.5–5.1)
Sodium: 143 mmol/L (ref 135–145)
TOTAL PROTEIN: 5 g/dL — AB (ref 6.5–8.1)

## 2016-11-02 LAB — CBC
HEMATOCRIT: 31.2 % — AB (ref 39.0–52.0)
Hemoglobin: 9.5 g/dL — ABNORMAL LOW (ref 13.0–17.0)
MCH: 29.2 pg (ref 26.0–34.0)
MCHC: 30.4 g/dL (ref 30.0–36.0)
MCV: 96 fL (ref 78.0–100.0)
PLATELETS: 230 10*3/uL (ref 150–400)
RBC: 3.25 MIL/uL — ABNORMAL LOW (ref 4.22–5.81)
RDW: 17.1 % — AB (ref 11.5–15.5)
WBC: 11.7 10*3/uL — ABNORMAL HIGH (ref 4.0–10.5)

## 2016-11-02 LAB — GLUCOSE, CAPILLARY
GLUCOSE-CAPILLARY: 174 mg/dL — AB (ref 65–99)
GLUCOSE-CAPILLARY: 158 mg/dL — AB (ref 65–99)
GLUCOSE-CAPILLARY: 185 mg/dL — AB (ref 65–99)
Glucose-Capillary: 164 mg/dL — ABNORMAL HIGH (ref 65–99)
Glucose-Capillary: 177 mg/dL — ABNORMAL HIGH (ref 65–99)
Glucose-Capillary: 213 mg/dL — ABNORMAL HIGH (ref 65–99)

## 2016-11-02 LAB — MAGNESIUM: MAGNESIUM: 2.2 mg/dL (ref 1.7–2.4)

## 2016-11-02 LAB — COOXEMETRY PANEL
Carboxyhemoglobin: 1.2 % (ref 0.5–1.5)
Methemoglobin: 1.1 % (ref 0.0–1.5)
O2 SAT: 63.1 %
TOTAL HEMOGLOBIN: 9.3 g/dL — AB (ref 12.0–16.0)

## 2016-11-02 LAB — APTT: aPTT: 78 seconds — ABNORMAL HIGH (ref 24–36)

## 2016-11-02 LAB — PHOSPHORUS: Phosphorus: 3.4 mg/dL (ref 2.5–4.6)

## 2016-11-02 LAB — PROTIME-INR
INR: 2.61
Prothrombin Time: 28.4 seconds — ABNORMAL HIGH (ref 11.4–15.2)

## 2016-11-02 MED ORDER — SODIUM CHLORIDE 0.9 % IV SOLN
INTRAVENOUS | Status: DC
  Administered 2016-11-02 – 2016-11-15 (×3): via INTRAVENOUS

## 2016-11-02 MED ORDER — GERHARDT'S BUTT CREAM
TOPICAL_CREAM | CUTANEOUS | Status: DC | PRN
  Administered 2016-11-02 – 2016-11-03 (×2): via TOPICAL
  Filled 2016-11-02: qty 1

## 2016-11-02 MED ORDER — PANTOPRAZOLE SODIUM 40 MG PO PACK
40.0000 mg | PACK | ORAL | Status: DC
  Administered 2016-11-02 – 2016-11-06 (×5): 40 mg
  Filled 2016-11-02 (×4): qty 20

## 2016-11-02 MED ORDER — FUROSEMIDE 10 MG/ML IJ SOLN
4.0000 mg/h | INTRAVENOUS | Status: DC
  Administered 2016-11-02 – 2016-11-07 (×6): 10 mg/h via INTRAVENOUS
  Filled 2016-11-02 (×14): qty 25

## 2016-11-02 MED ORDER — FUROSEMIDE 10 MG/ML IJ SOLN
40.0000 mg | Freq: Once | INTRAMUSCULAR | Status: AC
  Administered 2016-11-02: 40 mg via INTRAVENOUS
  Filled 2016-11-02: qty 4

## 2016-11-02 NOTE — Progress Notes (Signed)
PULMONARY / CRITICAL CARE MEDICINE   Name: LLIAM [name] MRN: [id number] DOB: 1946-04-29    ADMISSION DATE:  10/20/2016 CONSULTATION DATE: 12/1  REFERRING MD: Ricard Dillon  CHIEF COMPLAINT:  CAD  HISTORY OF PRESENT ILLNESS:   70 yo former smoker with known CAD and ascending aorta aneurysm, who was taken to OR 11/30 for cabg x 3, aortic root replacement, MVR replacement, thoracic cavity left open and had extended pump time in OR.  SUBJECTIVE:  Tolerating some pressure support.  Remains on pressors.  Still requiring high dose sedation.  VITAL SIGNS: BP (!) 87/64   Pulse 99   Temp (!) 96.8 F (36 C)   Resp 12   Ht 5\' 9"  (1.753 m)   Wt 260 lb 5.8 oz (118.1 kg)   SpO2 100%   BMI 38.45 kg/m   HEMODYNAMICS: CVP:  [18 mmHg-21 mmHg] 18 mmHg  VENTILATOR SETTINGS: Vent Mode: PRVC FiO2 (%):  [40 %] 40 % Set Rate:  [12 bmp] 12 bmp Vt Set:  [570 mL] 570 mL PEEP:  [5 cmH20] 5 cmH20 Pressure Support:  [5 cmH20] 5 cmH20 Plateau Pressure:  [13 cmH20-19 cmH20] 13 cmH20  INTAKE / OUTPUT: I/O last 3 completed shifts: In: 7287.2 [I.V.:4927.2; Other:60; NG/GT:1025; IV Piggyback:1275] Out: [id number] [Urine:3740; Stool:100; Chest Tube:1410]  PHYSICAL EXAMINATION: General: sedated Neuro: RASS -3 HEENT:  ETT in place Cardiovascular: irregular Lungs:  No wheeze Abdomen:  Soft, non tender Ext: 1+ edema, extremities cool Skin: Ischemic changes in fingers b/l   LABS:  BMET  Recent Labs Lab 10/31/16 0454 11/01/16 0415 11/02/16 0400  NA 145 144 143  K 3.9 4.3 4.5  CL 108 107 107  CO2 28 29 30   BUN 43* 38* 37*  CREATININE 0.77 0.79 0.69  GLUCOSE 299* 159* 179*    Electrolytes  Recent Labs Lab 10/27/16 1701  10/31/16 0454 11/01/16 0415 11/02/16 0400  CALCIUM  --   < > 7.3* 7.8* 8.1*  MG 2.6*  --   --  2.2 2.2  PHOS  --   --   --  3.1 3.4  < > = values in this interval not displayed.  CBC  Recent Labs Lab 10/31/16 0454 11/01/16 0415 11/02/16 0400  WBC 14.3* 13.9*  11.7*  HGB 10.9* 10.4* 9.5*  HCT 34.8* 33.4* 31.2*  PLT 226 236 230    Coag's  Recent Labs Lab 10/31/16 0454 11/01/16 0415 11/02/16 0400  APTT 85* 65* 78*  INR 4.34* 2.40 2.61    Sepsis Markers  Recent Labs Lab 10/27/16 0900 10/28/16 0500 10/29/16 0930 10/29/16 1628  LATICACIDVEN  --   --  2.0* 2.0*  PROCALCITON 4.56 4.33  --   --     ABG  Recent Labs Lab 10/28/16 0500 10/28/16 2305 10/29/16 0417  PHART 7.468* 7.514* 7.475*  PCO2ART 39.0 33.4 36.8  PO2ART 97.0 90.0 90.0    Liver Enzymes  Recent Labs Lab 10/31/16 0454 11/01/16 0415 11/02/16 0400  AST 122* 73* 51*  ALT 294* 184* 114*  ALKPHOS 127* 119 115  BILITOT 1.4* 1.5* 1.5*  ALBUMIN 1.7* 2.1* 2.3*    Cardiac Enzymes No results for input(s): TROPONINI, PROBNP in the last 168 hours.  Glucose  Recent Labs Lab 11/01/16 0811 11/01/16 1109 11/01/16 1606 11/01/16 2112 11/02/16 0001 11/02/16 0401  GLUCAP 232* 232* 176* 188* 213* 164*    Imaging Dg Chest Port 1 View  Result Date: 11/02/2016 CLINICAL DATA:  Status post CABG, aortic valve replacement and mitral valve repair,  and ascending thoracic aortic aneurysm repair on October 22, 2016. EXAM: PORTABLE CHEST 1 VIEW COMPARISON:  Portable chest x-ray of November 01, 2016 FINDINGS: The lungs are mildly hypoinflated. The interstitial markings are more conspicuous today. No pneumothorax is observed. There is no large pleural effusion. The bilateral chest tubes are in stable position. The endotracheal tube tip projects approximately 4.3 cm above the carina. The esophagogastric tube tip projects below the inferior margin of the image. The sternal wires appear intact. The cardiac silhouette is enlarged. The pulmonary vascularity is engorged and indistinct. IMPRESSION: Slight interval deterioration in the appearance of the pulmonary interstitium bilaterally compatible with increased interstitial edema though certainly interstitial pneumonia is not  excluded. There is no large pleural effusion and no pneumothorax is observed. Cardiomegaly with mild central pulmonary vascular congestion. The support tubes are in reasonable position. Electronically Signed   By: David  Martinique M.D.   On: 11/02/2016 07:31    STUDIES:  12.5 dopplers lowers>>>neg 12/6 dopplers arms >>>neg 12/7 abdo us>>>Cholelithiasis without sonographic evidence for acute cholecystitis. No biliary dilatation.Increased hepatic echogenicity, suggestive of steatosis. Ascites. 12/7 echo>>>60-65%, rv poor images but appeared wnl  CULTURES: 12/1 bc >>>neg  12/7 sputum>>> normal flora  12/4 BC x 4>>>neg 12/7 BC>>>    ANTIBIOTICS: 11/30 levaquin>>off 12/3 12/3 ceftriaxone>>>12/7 12/3 vanc>>> 12/7 meropenem>>>off 12/7 eraxis>>>off  SIGNIFICANT EVENTS: 11/30 surgery, hypoxia 12/4 closed chest 12/7 high fevers, LFt up  LINES/TUBES: 11/30 OTT>> 11/30 rt I j PA cath>>12/4 12/4 picc left >>> 11/30 l rad aline>>12/7 11/30 CT x 4>> 11/30 wound vac>>12/4   ASSESSMENT / PLAN:  PULMONARY A: Acute hypoxic respiratory failure after CABG, MVR. P:   Pressure support wean - not stable for extubation trial Might need trach to assist with vent weaning F/u CXR  CARDIOVASCULAR A:  S/p CABG, MVR, ascending aortic root replacement. Hypotension. A fib. Rt atrial clot. B/l hand ischemia. P:  Wean pressors to keep MAP > 65 Angiomax Lasix, primacor gtt per cardiology  RENAL A:   Hypervolemia. P:   Lasix gtt to restart 12/11  GASTROINTESTINAL A:   Nutrition. P:   Tube feeds while on vent Protonix for SUP  HEMATOLOGIC A:   DIC - resolved. Anemia of critical illness. P:  F/u CBC   INFECTIOUS A:   Fever 12/06 >> Tm 104F >> cultures negative to date P:   Abx per ID  ENDOCRINE A: DM. P:   SSI with lantus  NEUROLOGIC A:   Acute metabolic encephalopathy. P:   RASS goal 0  CC time 32 minutes.  Chesley Mires, MD [facility] Pulmonary/Critical  Care 11/02/2016, 8:20 AM Pager:  [phone] After 3pm call: [phone]

## 2016-11-02 NOTE — Progress Notes (Signed)
INFECTIOUS DISEASE PROGRESS NOTE  ID: Scott [name] is a 70 y.o. male with  Principal Problem:   S/P aortic valve replacement + mitral valve repair + CABG x3 + repair of ascending thoracic aortic aneurysm Active Problems:   Severe aortic stenosis by prior echocardiography   Unstable angina (HCC)   Severe aortic stenosis   Coronary artery disease involving native coronary artery of native heart with unstable angina pectoris (HCC)   Bicuspid aortic valve   Thoracic ascending aortic aneurysm (HCC)   Mitral regurgitation due to cusp prolapse   S/P ascending aortic aneurysm repair   S/P mitral valve repair   S/P CABG x 3   Cardiogenic shock (HCC)   Postoperative atrial fibrillation (HCC)   Thrombocytopenia (HCC)   Acute combined systolic and diastolic congestive heart failure (HCC)   Acute respiratory failure (HCC)   DIC (disseminated intravascular coagulation) (HCC)   Elevated liver function tests   Acute encephalopathy  Subjective: No response  Abtx:  Anti-infectives    Start     Dose/Rate Route Frequency Ordered Stop   11/01/16 1400  acyclovir (ZOVIRAX) 200 MG/5ML suspension SUSP 200 mg     200 mg Oral 4 times daily 11/01/16 1200     10/30/16 0927  anidulafungin (ERAXIS) 100 mg in sodium chloride 0.9 % 100 mL IVPB  Status:  Discontinued     100 mg over 90 Minutes Intravenous Every 24 hours 10/29/16 0927 10/31/16 1430   10/29/16 1000  meropenem (MERREM) 1 g in sodium chloride 0.9 % 100 mL IVPB  Status:  Discontinued     1 g 200 mL/hr over 30 Minutes Intravenous Every 8 hours 10/29/16 0931 10/31/16 1430   10/29/16 0930  anidulafungin (ERAXIS) 200 mg in sodium chloride 0.9 % 200 mL IVPB     200 mg over 180 Minutes Intravenous  Once 10/29/16 0927 10/29/16 1349   10/29/16 0600  vancomycin (VANCOCIN) 1,250 mg in sodium chloride 0.9 % 250 mL IVPB     1,250 mg 166.7 mL/hr over 90 Minutes Intravenous Every 24 hours 10/28/16 0900     10/28/16 0600  vancomycin (VANCOCIN) IVPB  750 mg/150 ml premix  Status:  Discontinued     750 mg 150 mL/hr over 60 Minutes Intravenous Every 12 hours 10/27/16 1226 10/28/16 0900   10/27/16 1400  cefTRIAXone (ROCEPHIN) 2 g in dextrose 5 % 50 mL IVPB  Status:  Discontinued     2 g 100 mL/hr over 30 Minutes Intravenous Every 24 hours 10/27/16 1203 10/29/16 0907   10/27/16 1400  rifampin (RIFADIN) 300 mg in sodium chloride 0.9 % 100 mL IVPB  Status:  Discontinued     300 mg 200 mL/hr over 30 Minutes Intravenous Every 8 hours 10/27/16 1226 10/27/16 1426   10/27/16 1330  vancomycin (VANCOCIN) 2,000 mg in sodium chloride 0.9 % 500 mL IVPB     2,000 mg 250 mL/hr over 120 Minutes Intravenous  Once 10/27/16 1226 10/27/16 1451   10/27/16 1000  levofloxacin (LEVAQUIN) IVPB 750 mg  Status:  Discontinued     750 mg 100 mL/hr over 90 Minutes Intravenous Every 24 hours 10/26/16 1054 10/27/16 1203   10/26/16 1815  vancomycin (VANCOCIN) IVPB 1000 mg/200 mL premix     1,000 mg 200 mL/hr over 60 Minutes Intravenous  Once 10/26/16 1054 10/26/16 1752   10/26/16 0954  polymyxin B 500,000 Units, bacitracin 50,000 Units in sodium chloride irrigation 0.9 % 500 mL irrigation  Status:  Discontinued  As needed 10/26/16 0954 10/26/16 1030   10/26/16 0400  vancomycin (VANCOCIN) 1,500 mg in sodium chloride 0.9 % 250 mL IVPB     1,500 mg 125 mL/hr over 120 Minutes Intravenous To Surgery 10/25/16 1145 10/26/16 0925   10/26/16 0400  vancomycin (VANCOCIN) 1,000 mg in sodium chloride 0.9 % 1,000 mL irrigation      Irrigation To Surgery 10/25/16 1145 10/26/16 0953   10/26/16 0400  levofloxacin (LEVAQUIN) IVPB 500 mg     500 mg 100 mL/hr over 60 Minutes Intravenous To Surgery 10/25/16 1145 10/26/16 0925   10/23/16 1000  levofloxacin (LEVAQUIN) IVPB 750 mg     750 mg 100 mL/hr over 90 Minutes Intravenous Every 24 hours 10/22/16 2130 10/23/16 1047   10/23/16 0515  vancomycin (VANCOCIN) IVPB 1000 mg/200 mL premix     1,000 mg 200 mL/hr over 60 Minutes  Intravenous  Once 10/22/16 2130 10/23/16 0600   10/22/16 2045  vancomycin (VANCOCIN) 1,250 mg in sodium chloride 0.9 % 250 mL IVPB  Status:  Discontinued     1,250 mg 166.7 mL/hr over 90 Minutes Intravenous To Surgery 10/22/16 2043 10/22/16 2130   10/22/16 2045  levofloxacin (LEVAQUIN) IVPB 500 mg  Status:  Discontinued     500 mg 100 mL/hr over 60 Minutes Intravenous To Surgery 10/22/16 2043 10/22/16 2130   10/22/16 0400  vancomycin (VANCOCIN) 1,250 mg in sodium chloride 0.9 % 250 mL IVPB     1,250 mg 166.7 mL/hr over 90 Minutes Intravenous To Surgery 10/21/16 1305 10/22/16 2247   10/22/16 0400  vancomycin (VANCOCIN) 1,000 mg in sodium chloride 0.9 % 1,000 mL irrigation      Irrigation To Surgery 10/21/16 1305 10/22/16 0819   10/22/16 0400  levofloxacin (LEVAQUIN) IVPB 500 mg     500 mg 100 mL/hr over 60 Minutes Intravenous To Surgery 10/21/16 1305 10/22/16 2114      Medications:  Scheduled: . acyclovir  200 mg Oral QID  . aspirin EC  325 mg Oral Daily   Or  . aspirin  324 mg Per Tube Daily  . chlorhexidine gluconate (MEDLINE KIT)  15 mL Mouth Rinse BID  . feeding supplement (PRO-STAT SUGAR FREE 64)  30 mL Per Tube BID  . insulin aspart  0-24 Units Subcutaneous Q4H  . insulin glargine  15 Units Subcutaneous BID  . mouth rinse  15 mL Mouth Rinse QID  . pantoprazole sodium  40 mg Per Tube Q24H  . potassium chloride  40 mEq Per Tube Q12H  . sodium chloride flush  3 mL Intravenous Q12H  . vancomycin  1,250 mg Intravenous Q24H    Objective: Vital signs in last 24 hours: Temp:  [96.8 F (36 C)-100.6 F (38.1 C)] 96.8 F (36 C) (12/11 0700) Pulse Rate:  [85-138] 92 (12/11 0725) Resp:  [0-27] 11 (12/11 0725) BP: (72-181)/(57-111) 87/64 (12/11 0700) SpO2:  [98 %-100 %] 100 % (12/11 0725) FiO2 (%):  [40 %] 40 % (12/11 0725) Weight:  [260 lb 5.8 oz (118.1 kg)] 260 lb 5.8 oz (118.1 kg) (12/11 0500)  Erythema or upper lip General appearance: no distress and sedated,  intubated Resp: good air mvmt. no rhonchi Chest wall: midline wound clean.  Cardio: regular rate and rhythm GI: normal findings: bowel sounds normal and soft, non-tender Extremities: livido, several fingers with dry gangrenous changes. cold feet.   Lab Results  Recent Labs  11/01/16 0415 11/02/16 0400  WBC 13.9* 11.7*  HGB 10.4* 9.5*  HCT 33.4* 31.2*  NA 144 143  K 4.3 4.5  CL 107 107  CO2 29 30  BUN 38* 37*  CREATININE 0.79 0.69   Liver Panel  Recent Labs  11/01/16 0415 11/02/16 0400  PROT 5.2* 5.0*  ALBUMIN 2.1* 2.3*  AST 73* 51*  ALT 184* 114*  ALKPHOS 119 115  BILITOT 1.5* 1.5*   Sedimentation Rate No results for input(s): ESRSEDRATE in the last 72 hours. C-Reactive Protein No results for input(s): CRP in the last 72 hours.  Microbiology: Recent Results (from the past 240 hour(s))  Culture, blood (Routine X 2) w Reflex to ID Panel     Status: None   Collection Time: 10/23/16  3:18 PM  Result Value Ref Range Status   Specimen Description BLOOD RIGHT HAND  Final   Special Requests IN PEDIATRIC BOTTLE [place]  Final   Culture NO GROWTH 6 DAYS  Final   Report Status 10/29/2016 FINAL  Final  Culture, blood (Routine X 2) w Reflex to ID Panel     Status: None   Collection Time: 10/23/16  3:23 PM  Result Value Ref Range Status   Specimen Description BLOOD RIGHT ANTECUBITAL  Final   Special Requests IN PEDIATRIC BOTTLE 3CC  Final   Culture NO GROWTH 6 DAYS  Final   Report Status 10/29/2016 FINAL  Final  Culture, blood (routine x 2)     Status: None   Collection Time: 10/26/16 12:45 PM  Result Value Ref Range Status   Specimen Description BLOOD RIGHT ANTECUBITAL  Final   Special Requests IN PEDIATRIC BOTTLE 1.5CC  Final   Culture NO GROWTH 5 DAYS  Final   Report Status 10/31/2016 FINAL  Final  Culture, blood (routine x 2)     Status: None   Collection Time: 10/26/16 12:50 PM  Result Value Ref Range Status   Specimen Description BLOOD RIGHT HAND  Final    Special Requests IN PEDIATRIC BOTTLE 3CC  Final   Culture NO GROWTH 5 DAYS  Final   Report Status 10/31/2016 FINAL  Final  Culture, blood (Routine X 2) w Reflex to ID Panel     Status: None (Preliminary result)   Collection Time: 10/29/16  2:47 AM  Result Value Ref Range Status   Specimen Description BLOOD RIGHT HAND  Final   Special Requests IN PEDIATRIC BOTTLE 1CC  Final   Culture NO GROWTH 3 DAYS  Final   Report Status PENDING  Incomplete  Culture, blood (Routine X 2) w Reflex to ID Panel     Status: None (Preliminary result)   Collection Time: 10/29/16  5:51 AM  Result Value Ref Range Status   Specimen Description BLOOD RIGHT ARM  Final   Special Requests BOTTLES DRAWN AEROBIC AND ANAEROBIC 5CC   Final   Culture NO GROWTH 3 DAYS  Final   Report Status PENDING  Incomplete  Culture, respiratory (NON-Expectorated)     Status: None   Collection Time: 10/29/16 10:21 AM  Result Value Ref Range Status   Specimen Description TRACHEAL ASPIRATE  Final   Special Requests NONE  Final   Gram Stain   Final    FEW WBC PRESENT, PREDOMINANTLY PMN MODERATE GRAM NEGATIVE RODS RARE GRAM POSITIVE COCCI    Culture Consistent with normal respiratory flora.  Final   Report Status 10/31/2016 FINAL  Final  Culture, respiratory (NON-Expectorated)     Status: None (Preliminary result)   Collection Time: 10/31/16 12:37 PM  Result Value Ref Range Status   Specimen Description TRACHEAL  ASPIRATE  Final   Special Requests Normal  Final   Gram Stain   Final    RARE WBC PRESENT, PREDOMINANTLY MONONUCLEAR RARE GRAM NEGATIVE COCCOBACILLI    Culture NO GROWTH < 24 HOURS  Final   Report Status PENDING  Incomplete  Culture, blood (single) w Reflex to ID Panel     Status: None (Preliminary result)   Collection Time: 10/31/16  2:08 PM  Result Value Ref Range Status   Specimen Description BLOOD RIGHT ANTECUBITAL  Final   Special Requests IN PEDIATRIC BOTTLE 3CC  Final   Culture NO GROWTH < 24 HOURS  Final    Report Status PENDING  Incomplete    Studies/Results: Dg Chest Port 1 View  Result Date: 11/02/2016 CLINICAL DATA:  Status post CABG, aortic valve replacement and mitral valve repair, and ascending thoracic aortic aneurysm repair on October 22, 2016. EXAM: PORTABLE CHEST 1 VIEW COMPARISON:  Portable chest x-ray of November 01, 2016 FINDINGS: The lungs are mildly hypoinflated. The interstitial markings are more conspicuous today. No pneumothorax is observed. There is no large pleural effusion. The bilateral chest tubes are in stable position. The endotracheal tube tip projects approximately 4.3 cm above the carina. The esophagogastric tube tip projects below the inferior margin of the image. The sternal wires appear intact. The cardiac silhouette is enlarged. The pulmonary vascularity is engorged and indistinct. IMPRESSION: Slight interval deterioration in the appearance of the pulmonary interstitium bilaterally compatible with increased interstitial edema though certainly interstitial pneumonia is not excluded. There is no large pleural effusion and no pneumothorax is observed. Cardiomegaly with mild central pulmonary vascular congestion. The support tubes are in reasonable position. Electronically Signed   By: David  Martinique M.D.   On: 11/02/2016 07:31   Dg Chest Port 1 View  Result Date: 11/01/2016 CLINICAL DATA:  Fever EXAM: PORTABLE CHEST 1 VIEW COMPARISON:  10/31/2016 FINDINGS: Endotracheal tube in good position. Bilateral chest tubes in place. No pneumothorax. Central venous catheter tip in the proximal SVC unchanged. Bibasilar atelectasis unchanged.  No effusion.  Negative for edema. IMPRESSION: Bibasilar airspace disease unchanged. Support lines in good position. No pneumothorax Electronically Signed   By: Franchot Gallo M.D.   On: 11/01/2016 07:31     Assessment/Plan: Post cardiotomy shock             CABG, MVR, AVR, aneurysm repair  -remains on pressors. On lasix gtt. Fever post cardiac  surgery - likely noninfectious. Cultures negative.  Vegetation vs Clot in RA Hepatitis -  Likely from shock, improving.   Total days of antibiotics: 7 days of vanc. (Off merrem/anidulafungin)  Tmax coming down, 100.6 yesterday. Cultures remain negative. plt count has normalized. INR is high. LFT has improved significantly. His source of fever is likely noninfectious, could be from the clots/peripheral ischemia (RA clot, has dry gangrene on hands now), hepatitis. Will d/c all abx and watch his course.           [name], [name] Infectious Diseases (pager) [phone] [email or web address] 11/02/2016, 9:21 AM  LOS: 13 days

## 2016-11-02 NOTE — Progress Notes (Signed)
Patient ID: Scott [name], male   DOB: 05-Mar-1946, 70 y.o.   MRN: [id number] EVENING ROUNDS NOTE :     [address]       [place] [address]             [phone]                 7 Days Post-Op Procedure(s) (LRB): STERNAL WASHOUT AND DELAYED PRIMARY CLOSURE (N/A) TRANSESOPHAGEAL ECHOCARDIOGRAM (TEE) (N/A)  11 days postop  CORONARY ARTERY BYPASS GRAFTING (CABG)x 2 WITH LIMA TO DIAGONAL, OPEN  HARVESTING OF RIGHT SAPHENOUS VEIN FOR VEIN GRAFT TO LAD (N/A) AORTIC VALVE REPLACEMENT (AVR) WITH SIZE 25 MM MAGNA EASE PERICARDIAL BIOPROSTHESIS - AORTIC (N/A) TRANSESOPHAGEAL ECHOCARDIOGRAM (TEE) (N/A) MITRAL VALVE REPAIR (MVR) WITH SIZE 30 SORIN ANNULOFLEX ANNULOPLASTY RING WITH SUBSEQUENT REMOVAL OF RING (N/A) ASCENDING AORTIC  ANEURYSM REPAIR (AAA) WITH 28 MM HEMASHIELD PLATINUM WOVEN DOUBLE VELOUR VASCULAR GRAFT    Total Length of Stay:  LOS: 13 days  BP (!) 87/74   Pulse (!) 105   Temp 99.1 F (37.3 C)   Resp 12   Ht 5\' 9"  (1.753 m)   Wt 260 lb 5.8 oz (118.1 kg)   SpO2 100%   BMI 38.45 kg/m   .Intake/Output      12/10 0701 - 12/11 0700 12/11 0701 - 12/12 0700   I.V. (mL/kg) 2942.1 (24.9) 1238.9 (10.5)   Other 60    NG/GT 670 1181.7   IV Piggyback 425    Total Intake(mL/kg) 4097.1 (34.7) 2420.6 (20.5)   Urine (mL/kg/hr) 2640 (0.9) 2475 (2)   Emesis/NG output     Stool 100 (0)    Chest Tube 870 (0.3) 220 (0.2)   Total Output 3610 2695   Net +487.1 -274.4          . sodium chloride 20 mL/hr at 10/28/16 0400  . sodium chloride 30 mL/hr at 11/02/16 1700  . bivalirudin (ANGIOMAX) infusion 0.5 mg/mL (Non-ACS indications) 0.08 mg/kg/hr (11/02/16 1700)  . dexmedetomidine 1.199 mcg/kg/hr (11/02/16 1700)  . feeding supplement (VITAL HIGH PROTEIN) 1,000 mL (11/02/16 1700)  . fentaNYL infusion INTRAVENOUS 400 mcg/hr (11/02/16 1700)  . furosemide (LASIX) infusion 10 mg/hr (11/02/16 1700)  . milrinone 0.1 mcg/kg/min (11/02/16 1700)  . phenylephrine  (NEO-SYNEPHRINE) Adult infusion 1 mcg/min (11/02/16 1700)     Lab Results  Component Value Date   WBC 11.7 (H) 11/02/2016   HGB 9.5 (L) 11/02/2016   HCT 31.2 (L) 11/02/2016   PLT 230 11/02/2016   GLUCOSE 179 (H) 11/02/2016   CHOL 134 10/21/2016   TRIG 203 (H) 10/21/2016   HDL 28 (L) 10/21/2016   LDLCALC 65 10/21/2016   ALT 114 (H) 11/02/2016   AST 51 (H) 11/02/2016   NA 143 11/02/2016   K 4.5 11/02/2016   CL 107 11/02/2016   CREATININE 0.69 11/02/2016   BUN 37 (H) 11/02/2016   CO2 30 11/02/2016   TSH 1.93 10/19/2016   INR 2.61 11/02/2016   HGBA1C 6.6 (H) 10/21/2016   Sedated on vent  On angiomax Neo off Now off antibiotics  [name] MD  Beeper [phone] Office [phone] 11/02/2016 5:40 PM

## 2016-11-02 NOTE — Progress Notes (Signed)
ANTICOAGULATION CONSULT NOTE - Follow Up Consult  Pharmacy Consult for Bivalirudin  Indication: possible clot in RA and afib   Allergies  Allergen Reactions  . Doxycycline Hives  . Penicillins Hives     Has patient had a PCN reaction causing immediate rash, facial/tongue/throat swelling, SOB or lightheadedness with hypotension: No Has patient had a PCN reaction causing severe rash involving mucus membranes or skin necrosis: No Has patient had a PCN reaction that required hospitalization: No Has patient had a PCN reaction occurring within the last 10 years: Yes If all of the above answers are "NO", then may proceed with Cephalosporin use.   . Sulfa Antibiotics Hives    Patient Measurements: Height: 5\' 9"  (175.3 cm) Weight: 260 lb 5.8 oz (118.1 kg) IBW/kg (Calculated) : 70.7  Vital Signs: Temp: 98.8 F (37.1 C) (12/11 1200) BP: 124/88 (12/11 1200) Pulse Rate: 129 (12/11 1200)  Labs:  Recent Labs  10/31/16 0454 11/01/16 0415 11/02/16 0400  HGB 10.9* 10.4* 9.5*  HCT 34.8* 33.4* 31.2*  PLT 226 236 230  APTT 85* 65* 78*  LABPROT 42.7* 26.6* 28.4*  INR 4.34* 2.40 2.61  CREATININE 0.77 0.79 0.69    Estimated Creatinine Clearance: 109 mL/min (by C-G formula based on SCr of 0.69 mg/dL).   Medications:  Bivalirudin @ 0.07mg /kg/hr  Assessment: 70yom continues on bivalirudin for possible right atrial clot and afib. APTT is therapeutic at 65 seconds. HIT ruled out (low heparin antibody and SRA negative). Heparin allergy removed. Platelets stable 236. INR back down to 2.6 from 4.34 - likely falsely elevated from effects of bivalirudin.  Goal of Therapy:  aPTT 50-85 seconds Monitor platelets by anticoagulation protocol: Yes   Plan:  1) Continue bivalirudin @ 0.07mg /kg/hr 2) Daily APTT 3) Follow up oral anticoagulation  Erin Hearing PharmD., BCPS Clinical Pharmacist Pager [phone] 11/02/2016 1:20 PM

## 2016-11-02 NOTE — Progress Notes (Addendum)
Scott [name]       [place] [address]             [phone]        CARDIOTHORACIC SURGERY PROGRESS NOTE   R7 Days Post-Op Procedure(s) (LRB): STERNAL WASHOUT AND DELAYED PRIMARY CLOSURE (N/A) TRANSESOPHAGEAL ECHOCARDIOGRAM (TEE) (N/A)  Subjective: Sedated on vent.  Has intermittently been following commands when sedation decreased.  Reportedly did not do very well w/ vent weaning over weekend due to agitation and hypertension when vent support on weaning mode.    Objective: Vital signs: BP Readings from Last 1 Encounters:  11/02/16 (!) 87/64   Pulse Readings from Last 1 Encounters:  11/02/16 99   Resp Readings from Last 1 Encounters:  11/02/16 12   Temp Readings from Last 1 Encounters:  11/02/16 (!) 96.8 F (36 C)    Hemodynamics: CVP:  [18 mmHg-21 mmHg] 18 mmHg  Mixed venous co-ox 63.1%   Physical Exam:  Rhythm:   Afib w/ controlled rate  Breath sounds: Coarse but fairly clear  Heart sounds:  RRR  Incisions:  Clean and dry  Abdomen:  Soft, non-distended, + very loose stool, tolerating tube feeds  Extremities:  Severe gangrenous changes fingers both hands - much worse than they looked last week.  Appearance of both feet essentially stable   Intake/Output from previous day: 12/10 0701 - 12/11 0700 In: 4097.1 [I.V.:2942.1; NG/GT:670; IV Piggyback:425] Out: [id number] [Urine:2640; Stool:100; Chest Tube:870] Intake/Output this shift: No intake/output data recorded.  Lab Results:  CBC: Recent Labs  11/01/16 0415 11/02/16 0400  WBC 13.9* 11.7*  HGB 10.4* 9.5*  HCT 33.4* 31.2*  PLT 236 230    BMET:  Recent Labs  11/01/16 0415 11/02/16 0400  NA 144 143  K 4.3 4.5  CL 107 107  CO2 29 30  GLUCOSE 159* 179*  BUN 38* 37*  CREATININE 0.79 0.69  CALCIUM 7.8* 8.1*     PT/INR:   Recent Labs  11/02/16 0400  LABPROT 28.4*  INR 2.61    CBG (last 3)   Recent Labs  11/02/16 0001 11/02/16 0401 11/02/16 0845  GLUCAP 213* 164*  174*    ABG    Component Value Date/Time   PHART 7.475 (H) 10/29/2016 0417   PCO2ART 36.8 10/29/2016 0417   PO2ART 90.0 10/29/2016 0417   HCO3 26.6 10/29/2016 0417   TCO2 28 10/29/2016 0417   ACIDBASEDEF 1.0 10/27/2016 1125   O2SAT 63.1 11/02/2016 0400    CXR: PORTABLE CHEST 1 VIEW  COMPARISON:  Portable chest x-ray of November 01, 2016  FINDINGS: The lungs are mildly hypoinflated. The interstitial markings are more conspicuous today. No pneumothorax is observed. There is no large pleural effusion. The bilateral chest tubes are in stable position. The endotracheal tube tip projects approximately 4.3 cm above the carina. The esophagogastric tube tip projects below the inferior margin of the image. The sternal wires appear intact. The cardiac silhouette is enlarged. The pulmonary vascularity is engorged and indistinct.  IMPRESSION: Slight interval deterioration in the appearance of the pulmonary interstitium bilaterally compatible with increased interstitial edema though certainly interstitial pneumonia is not excluded. There is no large pleural effusion and no pneumothorax is observed. Cardiomegaly with mild central pulmonary vascular congestion.  The support tubes are in reasonable position.   Electronically Signed   By: David  Martinique M.D.   On: 11/02/2016 07:31  Assessment/Plan: S/P Procedure(s) (LRB): STERNAL WASHOUT AND DELAYED PRIMARY CLOSURE (N/A) TRANSESOPHAGEAL ECHOCARDIOGRAM (TEE) (N/A)  CV:                 Stable in rate-controlled Afib.  Still on very low dose Neo drip.  CVP 18 and co-ox 63%  Follow up ECHO 12/7 w/ normal/hyperdynamic LV function and normal RV size and function, normal functioning aortic valve prosthesis and only mild MR.  Continue weaning milrinone off.  Restart lasix.  RESP:             Oxygenation quite good and CXR remains clear but hasn't done well w/ weaning trials.  Bilateral pleural tubes draining decreasing volume thin  serosanguinous fluid - leave in place until output decreases further.  Vent weaning and sedation per Pulm/CCM team.  ID:                   Afebrile and WBC down to 11,700.  All cultures negative.  Day 4  Vanc, meropenem and anidulafungin.  Surgical incisions look good.  Extremities look worse, especially both hands.  NEURO:          Currently heavily sedated but encephalopathy resolving as yesterday patient was following commands.   RENAL:          I/O's positive last 48 hours and weight trending up.  Hypernatremia resolved.  BUN/Cr normal.  Probably needs diuresis resumed.  GI:                   Tolerating full support tube feeds.  + liquid stool  LFT's down almost normal  HEME:  Hgb/Hct essentially stable.  Platelet count normal.  Could consider switching bivalirudin back to heparin since HIT Ab and SRA both negative.  Ultimately will plan to start coumadin but may need other procedures  Rexene Alberts, MD 11/02/2016 8:59 AM

## 2016-11-02 NOTE — Progress Notes (Signed)
Patient ID: Scott [name], male   DOB: Mar 04, 1946, 70 y.o.   MRN: [id number]     Advanced Heart Failure Rounding Note  Referring Physician: Dr Roxy Manns Primary Physician: Dr Quintin Alto Primary Cardiologist:  Dr Percival Spanish   Reason for Consultation: Heart Failure   Subjective:    Admitted with chest pain. RHC//LHC with 3 vessel disease. Taken to the OR 11/30 for cabg x 3, repair of thoracic ascending aneurysm, AVR bioprosthetic,  MVR, and open chest with VAC placement.    Chest successfully closed 10/26/16.    Coox 63% this am on milrinone 0.2 mcg/kg/min.     CVP 20, he is off Lasix at this point.  Remains intubated and sedated. (On fentanyl and precedex.).  Had to start on phenylephrine due to soft BP, remains on 5.   Suspect DIC and not HIT.  He remains on bivalirudin with RA clot and atrial fibrillation. HIT Ab and SRA negative.  Fever of uncertain etiology.  Tm 100.6 (yesterday pm).  ID following, suspect not infectious (may be from gangrenous digits). Now off anidulafungin and meropenem, vanco continues.     TEE 10/26/16 EF 40-45%, small cavity size, flattened/hypokinetic septum, s/p MV repair with mild MR, mild to moderately decreased RV systolic function. + RA thrombus.   Limited echo 10/29/16 LVEF 60-65%, unable to comment on AVR structure or function with poor windows.   Upper and lower extremity doppler studies: No DVTs  ABIs (12/8): Unable to doppler PTs, or get TBIs.   Objective:   Weight Range: 260 lb 5.8 oz (118.1 kg) Body mass index is 38.45 kg/m.   Vital Signs:   Temp:  [96.8 F (36 C)-100.6 F (38.1 C)] 96.8 F (36 C) (12/11 0700) Pulse Rate:  [85-138] 99 (12/11 0700) Resp:  [0-30] 12 (12/11 0700) BP: (72-181)/(57-111) 87/64 (12/11 0700) SpO2:  [98 %-100 %] 100 % (12/11 0700) FiO2 (%):  [40 %] 40 % (12/11 0327) Weight:  [260 lb 5.8 oz (118.1 kg)] 260 lb 5.8 oz (118.1 kg) (12/11 0500) Last BM Date: 10/29/16  Weight change: Filed Weights   10/29/16 0422  10/31/16 0354 11/02/16 0500  Weight: 257 lb 15 oz (117 kg) 259 lb 11.2 oz (117.8 kg) 260 lb 5.8 oz (118.1 kg)    Intake/Output:   Intake/Output Summary (Last 24 hours) at 11/02/16 0756 Last data filed at 11/02/16 0700  Gross per 24 hour  Intake          4097.08 ml  Output             3610 ml  Net           487.08 ml     Physical Exam: CVP 20 General: Intubated and sedated. HEENT: ETT in place. Neck: supple. JVP elevated to jaw. Carotids 2+ bilat; no bruits. No thyromegaly or nodule noted.  RIJ swan Cor: PMI nondisplaced. Irregularly irregular. No M/G/R noted. Chest tubes in place.  Lungs: Mechanical breathing sounds. +Rhonchi  Abdomen: soft, ND, no HSM. No bruits or masses. +BS  Extremities: no clubbing, rash. L radial aline. 2+ edema remains. Digital gangrene. Toes remain mottled, some improvement. Unable to doppler pedal pulses.  Neuro: Intubated and sedated.  GU: Foley in place  Telemetry: Reviewed, showing atrial flutter in 80s.   Labs: CBC  Recent Labs  11/01/16 0415 11/02/16 0400  WBC 13.9* 11.7*  HGB 10.4* 9.5*  HCT 33.4* 31.2*  MCV 95.4 96.0  PLT 236 615   Basic Metabolic Panel  Recent Labs  11/01/16 0415 11/02/16 0400  NA 144 143  K 4.3 4.5  CL 107 107  CO2 29 30  GLUCOSE 159* 179*  BUN 38* 37*  CREATININE 0.79 0.69  CALCIUM 7.8* 8.1*  MG 2.2 2.2  PHOS 3.1 3.4   Liver Function Tests  Recent Labs  11/01/16 0415 11/02/16 0400  AST 73* 51*  ALT 184* 114*  ALKPHOS 119 115  BILITOT 1.5* 1.5*  PROT 5.2* 5.0*  ALBUMIN 2.1* 2.3*   No results for input(s): LIPASE, AMYLASE in the last 72 hours. Cardiac Enzymes No results for input(s): CKTOTAL, CKMB, CKMBINDEX, TROPONINI in the last 72 hours.  BNP: BNP (last 3 results) No results for input(s): BNP in the last 8760 hours.  ProBNP (last 3 results) No results for input(s): PROBNP in the last 8760 hours.   D-Dimer No results for input(s): DDIMER in the last 72 hours. Hemoglobin A1C No  results for input(s): HGBA1C in the last 72 hours. Fasting Lipid Panel No results for input(s): CHOL, HDL, LDLCALC, TRIG, CHOLHDL, LDLDIRECT in the last 72 hours. Thyroid Function Tests No results for input(s): TSH, T4TOTAL, T3FREE, THYROIDAB in the last 72 hours.  Invalid input(s): FREET3  Other results:  Imaging/Studies:  Dg Chest Port 1 View  Result Date: 11/02/2016 CLINICAL DATA:  Status post CABG, aortic valve replacement and mitral valve repair, and ascending thoracic aortic aneurysm repair on October 22, 2016. EXAM: PORTABLE CHEST 1 VIEW COMPARISON:  Portable chest x-ray of November 01, 2016 FINDINGS: The lungs are mildly hypoinflated. The interstitial markings are more conspicuous today. No pneumothorax is observed. There is no large pleural effusion. The bilateral chest tubes are in stable position. The endotracheal tube tip projects approximately 4.3 cm above the carina. The esophagogastric tube tip projects below the inferior margin of the image. The sternal wires appear intact. The cardiac silhouette is enlarged. The pulmonary vascularity is engorged and indistinct. IMPRESSION: Slight interval deterioration in the appearance of the pulmonary interstitium bilaterally compatible with increased interstitial edema though certainly interstitial pneumonia is not excluded. There is no large pleural effusion and no pneumothorax is observed. Cardiomegaly with mild central pulmonary vascular congestion. The support tubes are in reasonable position. Electronically Signed   By: David  Martinique M.D.   On: 11/02/2016 07:31   Dg Chest Port 1 View  Result Date: 11/01/2016 CLINICAL DATA:  Fever EXAM: PORTABLE CHEST 1 VIEW COMPARISON:  10/31/2016 FINDINGS: Endotracheal tube in good position. Bilateral chest tubes in place. No pneumothorax. Central venous catheter tip in the proximal SVC unchanged. Bibasilar atelectasis unchanged.  No effusion.  Negative for edema. IMPRESSION: Bibasilar airspace disease  unchanged. Support lines in good position. No pneumothorax Electronically Signed   By: Franchot Gallo M.D.   On: 11/01/2016 07:31   Dg Chest Port 1 View  Result Date: 10/31/2016 CLINICAL DATA:  Atelectasis. EXAM: PORTABLE CHEST 1 VIEW COMPARISON:  10/29/2016 FINDINGS: Median sternotomy wires unchanged. Endotracheal tube with tip 5.4 cm above the carina. Nasogastric tube courses into the stomach and off the inferior portion of the film. Left-sided PICC line has tip obliquely oriented over the SVC. Bilateral chest tubes unchanged. Lungs are adequately inflated with minimal stable perihilar bibasilar opacification likely atelectasis. No significant effusion. No pneumothorax. Stable cardiomegaly. Remainder of the exam is unchanged. IMPRESSION: Mild stable opacification in the perihilar bibasilar regions likely atelectasis. Stable cardiomegaly. Tubes and lines as described. Electronically Signed   By: Marin Olp M.D.   On: 10/31/2016 08:18    Medications:  Scheduled Medications: . sodium chloride   Intravenous Once  . acyclovir  200 mg Oral QID  . albumin human  12.5 g Intravenous Q6H  . aspirin EC  325 mg Oral Daily   Or  . aspirin  324 mg Per Tube Daily  . chlorhexidine gluconate (MEDLINE KIT)  15 mL Mouth Rinse BID  . famotidine (PEPCID) IV  20 mg Intravenous Q12H  . feeding supplement (PRO-STAT SUGAR FREE 64)  30 mL Per Tube BID  . furosemide  40 mg Intravenous Once  . insulin aspart  0-24 Units Subcutaneous Q4H  . insulin glargine  15 Units Subcutaneous BID  . mouth rinse  15 mL Mouth Rinse QID  . potassium chloride  40 mEq Per Tube Q12H  . sodium chloride flush  3 mL Intravenous Q12H  . vancomycin  1,250 mg Intravenous Q24H    Infusions: . sodium chloride 20 mL/hr at 10/28/16 0400  . bivalirudin (ANGIOMAX) infusion 0.5 mg/mL (Non-ACS indications) 0.08 mg/kg/hr (11/01/16 2152)  . dexmedetomidine 1.2 mcg/kg/hr (11/02/16 1245)  . dextrose 50 mL/hr at 11/01/16 1800  . feeding  supplement (VITAL HIGH PROTEIN) 1,000 mL (11/01/16 1800)  . fentaNYL infusion INTRAVENOUS 400 mcg/hr (11/02/16 0659)  . furosemide (LASIX) infusion    . milrinone 0.2 mcg/kg/min (11/01/16 2000)  . phenylephrine (NEO-SYNEPHRINE) Adult infusion 5 mcg/min (11/02/16 0630)    PRN Medications: acetaminophen (TYLENOL) oral liquid 160 mg/5 mL, ipratropium-albuterol, metoprolol, midazolam, morphine injection, ondansetron (ZOFRAN) IV, sodium chloride flush   Assessment/Plan   Mr Dasch is 70 year old admitted with CP. Complex operation this admission.  CABG x 3 + bioprosthetic AVR for severe bicuspid AS + ascending aorta replacement + MV repair for flail P2.  MV repair complicated by severe SAM, ended up having to remove posterior annuloplasty band.  Chest closed on 12/3.   1. CAD:  10/22/16 S/P CABG x3 with chest left open.  Chest successfully closed am of 10/26/16. - Continue ASA, start statin when LFTs come down.  2. Severe Aortic Stenosis: 11/302017 S/P AVR bioprostheic  3. Mitral regurgitation: Partial flail leaflet with MV repair.  Developed SAM initially after repair and posterior annuloplasty ring had to be removed.  SAM resolved. 4. Acute on chronic systolic CHF:  TEE at time of chest closure showed EF 40-45%, small cavity size, flattened/hypokinetic septum, s/p MV repair with mild MR, mild to moderately decreased RV systolic function, thrombus in RA. Repeat echo 12/7 with EF 60-65%.  Co-ox 63% today with CVP 20.  Remains on phenylephrine 5.  - Needs to restart Lasix, give 40 mg IV x 1 then 10 mg/hr.  - Wean phenylephrine slowly.  - Wean milrinone to 0.1, may help with hypotension (EF was normal on most recent echo).  5. ID: ?Source => ID following, do not think endocarditis.  Concerned digital gangrene is driving this. Still with low grade fever as of yesterday evening. cultures negative so far.    - Now off meropenem and anidulafungin, ID suggests stopping vancomycin.   6. Elevated LFTs:  Most likely shock liver.  LFTs now trended down.   - Keep off amio for now as long as rate controlled, can restart if needed.    - No statin until LFTs come down.  - Abdominal US 10/29/16 with Cholelithiasis without sonographic evidence for acute cholecystitis. No biliary dilatation. Increased hepatic echogenicity, suggestive of steatosis. and Ascites.   7. Heme: HIT negative, suspect DIC. Platelet count has recovered.  - On bivalirudin for RA thrombus  and atrial fibrillation.  - Will need transition to warfarin eventually.  8. VDRF: CCM following for vent management. Pulmonary edema present, ?PNA.  9. Atrial fibrillation: Paroxysmal post-op. He is in and out of atrial fibrillation.  Rate is controlled.   - Continue anticoagulation => right now on bivalirudin.    - Off amio with LFT elevation. LFTs trending down, may restart if needed for rate control, tolerate HR < 120.  10. PAD: Finger gangrene, mottled toes.  Toes may be looking a bit better.  Unable to doppler PT pulses or get TBIs.  Continues on bivalirudin. Loralie Champagne 11/02/2016 7:56 AM  Advanced Heart Failure Team Pager [phone] (M-F; 7a - 4p)  Please contact [facility] for night-coverage after hours (4p -7a ) and weekends on [email or web address]

## 2016-11-03 ENCOUNTER — Inpatient Hospital Stay (HOSPITAL_COMMUNITY): Payer: Medicare Other

## 2016-11-03 LAB — CULTURE, RESPIRATORY W GRAM STAIN: Special Requests: NORMAL

## 2016-11-03 LAB — GLUCOSE, CAPILLARY
GLUCOSE-CAPILLARY: 127 mg/dL — AB (ref 65–99)
GLUCOSE-CAPILLARY: 107 mg/dL — AB (ref 65–99)
Glucose-Capillary: 149 mg/dL — ABNORMAL HIGH (ref 65–99)
Glucose-Capillary: 154 mg/dL — ABNORMAL HIGH (ref 65–99)
Glucose-Capillary: 175 mg/dL — ABNORMAL HIGH (ref 65–99)

## 2016-11-03 LAB — COMPREHENSIVE METABOLIC PANEL
ALT: 84 U/L — ABNORMAL HIGH (ref 17–63)
AST: 46 U/L — ABNORMAL HIGH (ref 15–41)
Albumin: 2.1 g/dL — ABNORMAL LOW (ref 3.5–5.0)
Alkaline Phosphatase: 129 U/L — ABNORMAL HIGH (ref 38–126)
Anion gap: 6 (ref 5–15)
BUN: 35 mg/dL — ABNORMAL HIGH (ref 6–20)
CHLORIDE: 105 mmol/L (ref 101–111)
CO2: 34 mmol/L — AB (ref 22–32)
Calcium: 7.9 mg/dL — ABNORMAL LOW (ref 8.9–10.3)
Creatinine, Ser: 0.76 mg/dL (ref 0.61–1.24)
Glucose, Bld: 120 mg/dL — ABNORMAL HIGH (ref 65–99)
POTASSIUM: 4.1 mmol/L (ref 3.5–5.1)
SODIUM: 145 mmol/L (ref 135–145)
Total Bilirubin: 1.4 mg/dL — ABNORMAL HIGH (ref 0.3–1.2)
Total Protein: 4.9 g/dL — ABNORMAL LOW (ref 6.5–8.1)

## 2016-11-03 LAB — CBC
HEMATOCRIT: 33.8 % — AB (ref 39.0–52.0)
HEMOGLOBIN: 10.1 g/dL — AB (ref 13.0–17.0)
MCH: 29 pg (ref 26.0–34.0)
MCHC: 29.9 g/dL — ABNORMAL LOW (ref 30.0–36.0)
MCV: 97.1 fL (ref 78.0–100.0)
Platelets: 244 10*3/uL (ref 150–400)
RBC: 3.48 MIL/uL — AB (ref 4.22–5.81)
RDW: 17.6 % — ABNORMAL HIGH (ref 11.5–15.5)
WBC: 18.1 10*3/uL — AB (ref 4.0–10.5)

## 2016-11-03 LAB — CULTURE, BLOOD (ROUTINE X 2)
CULTURE: NO GROWTH
Culture: NO GROWTH

## 2016-11-03 LAB — C DIFFICILE QUICK SCREEN W PCR REFLEX
C DIFFICILE (CDIFF) INTERP: DETECTED
C DIFFICILE (CDIFF) TOXIN: POSITIVE — AB
C DIFFICLE (CDIFF) ANTIGEN: POSITIVE — AB

## 2016-11-03 LAB — PROTIME-INR
INR: 2.52
PROTHROMBIN TIME: 27.7 s — AB (ref 11.4–15.2)

## 2016-11-03 LAB — COOXEMETRY PANEL
CARBOXYHEMOGLOBIN: 1.8 % — AB (ref 0.5–1.5)
Methemoglobin: 1 % (ref 0.0–1.5)
O2 SAT: 65.3 %
Total hemoglobin: 9.4 g/dL — ABNORMAL LOW (ref 12.0–16.0)

## 2016-11-03 LAB — APTT: aPTT: 75 seconds — ABNORMAL HIGH (ref 24–36)

## 2016-11-03 LAB — MAGNESIUM: MAGNESIUM: 1.9 mg/dL (ref 1.7–2.4)

## 2016-11-03 MED ORDER — CALCIUM CHLORIDE 10 % IV SOLN
1.0000 g | Freq: Once | INTRAVENOUS | Status: AC
  Administered 2016-11-03: 1 g via INTRAVENOUS

## 2016-11-03 MED ORDER — FREE WATER
200.0000 mL | Freq: Three times a day (TID) | Status: DC
  Administered 2016-11-03 – 2016-11-05 (×5): 200 mL

## 2016-11-03 MED ORDER — ALBUMIN HUMAN 5 % IV SOLN
12.5000 g | Freq: Once | INTRAVENOUS | Status: AC
  Administered 2016-11-03: 12.5 g via INTRAVENOUS

## 2016-11-03 MED ORDER — AMIODARONE HCL IN DEXTROSE 360-4.14 MG/200ML-% IV SOLN
30.0000 mg/h | INTRAVENOUS | Status: DC
  Administered 2016-11-03 – 2016-11-05 (×4): 30 mg/h via INTRAVENOUS
  Filled 2016-11-03 (×4): qty 200

## 2016-11-03 MED ORDER — AMIODARONE LOAD VIA INFUSION
150.0000 mg | Freq: Once | INTRAVENOUS | Status: AC
  Administered 2016-11-03: 150 mg via INTRAVENOUS
  Filled 2016-11-03: qty 83.34

## 2016-11-03 MED ORDER — VANCOMYCIN 50 MG/ML ORAL SOLUTION
500.0000 mg | Freq: Four times a day (QID) | ORAL | Status: AC
  Administered 2016-11-03 – 2016-11-17 (×56): 500 mg
  Filled 2016-11-03 (×56): qty 10

## 2016-11-03 MED ORDER — AMIODARONE HCL IN DEXTROSE 360-4.14 MG/200ML-% IV SOLN
60.0000 mg/h | INTRAVENOUS | Status: AC
  Administered 2016-11-03 (×2): 60 mg/h via INTRAVENOUS
  Filled 2016-11-03 (×2): qty 200

## 2016-11-03 NOTE — Progress Notes (Signed)
CRITICAL VALUE ALERT  Critical value received:  C-diff positive  Date of notification:  11/03/16  Time of notification:  2325  Critical value read back: yes  Nurse who received alert:  A. Haggard RN  PO Vancomycin already ordered. Will pass value on to day shift RN to inform Dr. Roxy Manns. Richarda Blade RN

## 2016-11-03 NOTE — Progress Notes (Signed)
Patient ID: Scott [name], male   DOB: 1946-10-07, 70 y.o.   MRN: [id number]     Advanced Heart Failure Rounding Note  Referring Physician: Dr Roxy Manns Primary Physician: Dr Quintin Alto Primary Cardiologist:  Dr Percival Spanish   Reason for Consultation: Heart Failure   Subjective:    Admitted with chest pain. RHC//LHC with 3 vessel disease. Taken to the OR 11/30 for cabg x 3, repair of thoracic ascending aneurysm, AVR bioprosthetic,  MVR, and open chest with VAC placement.    Chest successfully closed 10/26/16.    Coox 65.3% this am on milrinone 0.1 mcg/kg/min.      CVP 15. Currently on lasix 10 mg/hr.  Remains intubated and sedated. Occasionally awakes and responds to command. Had to start on phenylephrine due to soft BP, remains on 2.5  Started back on amio drip at 30 mg/hr with rates up to 150/160s overnight.  Improved to 90-100s currently.   Suspect DIC and not HIT.  He remains on bivalirudin with RA clot and atrial fibrillation. HIT Ab and SRA negative.  Fever of unclear source. Tm back up 102. ID following.  May be from gangrenous digits. Now off anidulafungin and meropenem. Vanco stopped yesterday.      TEE 10/26/16 EF 40-45%, small cavity size, flattened/hypokinetic septum, s/p MV repair with mild MR, mild to moderately decreased RV systolic function. + RA thrombus.   Limited echo 10/29/16 LVEF 60-65%, unable to comment on AVR structure or function with poor windows.   Upper and lower extremity doppler studies: No DVTs  ABIs (12/8): Unable to doppler PTs, or get TBIs.   Objective:   Weight Range: 260 lb 5.8 oz (118.1 kg) Body mass index is 38.45 kg/m.   Vital Signs:   Temp:  [97.3 F (36.3 C)-102 F (38.9 C)] 101.8 F (38.8 C) (12/12 0730) Pulse Rate:  [78-146] 111 (12/12 0730) Resp:  [0-31] 27 (12/12 0730) BP: (81-173)/(52-121) 91/64 (12/12 0730) SpO2:  [97 %-100 %] 98 % (12/12 0800) FiO2 (%):  [40 %] 40 % (12/12 0800) Last BM Date: 11/02/16  Weight change: Filed  Weights   10/29/16 0422 10/31/16 0354 11/02/16 0500  Weight: 257 lb 15 oz (117 kg) 259 lb 11.2 oz (117.8 kg) 260 lb 5.8 oz (118.1 kg)    Intake/Output:   Intake/Output Summary (Last 24 hours) at 11/03/16 0921 Last data filed at 11/03/16 0700  Gross per 24 hour  Intake          4174.97 ml  Output             6060 ml  Net         -1885.03 ml     Physical Exam: CVP 15 General: Remains intubated and sedated.  HEENT: + ETT.  Neck: supple. JVP at least 14-15. Carotids 2+ bilat; no bruits. No thyromegaly or nodule noted.  RIJ swan Cor: PMI nondisplaced. Irregularly, irregular.  No M/G/R noted. Chest tubes in place.  Lungs: Scattered rhonchi, Mechanical breathing sounds present.   Abdomen: soft, ND, no HSM. No bruits or masses. +BS  Extremities: no clubbing, rash. L radial aline. 1-2+ edema persists. Digital gangrene. Toes remain mottled, some improvement. Unable to doppler pedal pulses.  Neuro: Intubated and sedated.  GU: Foley in place  Telemetry: Reviewed, Currently afib flutter 100s  Labs: CBC  Recent Labs  11/02/16 0400 11/03/16 0449  WBC 11.7* 18.1*  HGB 9.5* 10.1*  HCT 31.2* 33.8*  MCV 96.0 97.1  PLT 230 440   Basic Metabolic Panel  Recent Labs  11/01/16 0415 11/02/16 0400 11/03/16 0449  NA 144 143 145  K 4.3 4.5 4.1  CL 107 107 105  CO2 29 30 34*  GLUCOSE 159* 179* 120*  BUN 38* 37* 35*  CREATININE 0.79 0.69 0.76  CALCIUM 7.8* 8.1* 7.9*  MG 2.2 2.2 1.9  PHOS 3.1 3.4  --    Liver Function Tests  Recent Labs  11/02/16 0400 11/03/16 0449  AST 51* 46*  ALT 114* 84*  ALKPHOS 115 129*  BILITOT 1.5* 1.4*  PROT 5.0* 4.9*  ALBUMIN 2.3* 2.1*   No results for input(s): LIPASE, AMYLASE in the last 72 hours. Cardiac Enzymes No results for input(s): CKTOTAL, CKMB, CKMBINDEX, TROPONINI in the last 72 hours.  BNP: BNP (last 3 results) No results for input(s): BNP in the last 8760 hours.  ProBNP (last 3 results) No results for input(s): PROBNP in the  last 8760 hours.   D-Dimer No results for input(s): DDIMER in the last 72 hours. Hemoglobin A1C No results for input(s): HGBA1C in the last 72 hours. Fasting Lipid Panel No results for input(s): CHOL, HDL, LDLCALC, TRIG, CHOLHDL, LDLDIRECT in the last 72 hours. Thyroid Function Tests No results for input(s): TSH, T4TOTAL, T3FREE, THYROIDAB in the last 72 hours.  Invalid input(s): FREET3  Other results:  Imaging/Studies:  Dg Chest Port 1 View  Result Date: 11/03/2016 CLINICAL DATA:  Respiratory failure ; status post CABG, aortic valve replacement, mitral valve repair, ascending thoracic aortic aneurysm repair ; sternal closure on October 26, 2016 EXAM: PORTABLE CHEST 1 VIEW COMPARISON:  Portable chest x-ray of November 02, 2016 FINDINGS: The lungs are adequately inflated. Bilateral chest tubes are present. There is no pneumothorax nor significant pleural effusion. The interstitial markings remain mildly prominent. The cardiac silhouette remains enlarged. The pulmonary vascularity is prominent centrally. The endotracheal tube tip lies approximately 6.1 cm above the carina. The esophagogastric tube tip projects below the inferior margin of the image. The PICC line tip projects over the proximal SVC. There is calcification in the wall of the aortic arch. IMPRESSION: Stable appearance of the chest since yesterday's study. The support tubes are in stable position. Electronically Signed   By: David  Martinique M.D.   On: 11/03/2016 08:31   Dg Chest Port 1 View  Result Date: 11/02/2016 CLINICAL DATA:  Status post CABG, aortic valve replacement and mitral valve repair, and ascending thoracic aortic aneurysm repair on October 22, 2016. EXAM: PORTABLE CHEST 1 VIEW COMPARISON:  Portable chest x-ray of November 01, 2016 FINDINGS: The lungs are mildly hypoinflated. The interstitial markings are more conspicuous today. No pneumothorax is observed. There is no large pleural effusion. The bilateral chest tubes  are in stable position. The endotracheal tube tip projects approximately 4.3 cm above the carina. The esophagogastric tube tip projects below the inferior margin of the image. The sternal wires appear intact. The cardiac silhouette is enlarged. The pulmonary vascularity is engorged and indistinct. IMPRESSION: Slight interval deterioration in the appearance of the pulmonary interstitium bilaterally compatible with increased interstitial edema though certainly interstitial pneumonia is not excluded. There is no large pleural effusion and no pneumothorax is observed. Cardiomegaly with mild central pulmonary vascular congestion. The support tubes are in reasonable position. Electronically Signed   By: David  Martinique M.D.   On: 11/02/2016 07:31    Medications:     Scheduled Medications: . acyclovir  200 mg Oral QID  . aspirin EC  325 mg Oral Daily   Or  . aspirin  324 mg Per Tube Daily  . chlorhexidine gluconate (MEDLINE KIT)  15 mL Mouth Rinse BID  . feeding supplement (PRO-STAT SUGAR FREE 64)  30 mL Per Tube BID  . insulin aspart  0-24 Units Subcutaneous Q4H  . insulin glargine  15 Units Subcutaneous BID  . mouth rinse  15 mL Mouth Rinse QID  . pantoprazole sodium  40 mg Per Tube Q24H  . potassium chloride  40 mEq Per Tube Q12H  . sodium chloride flush  3 mL Intravenous Q12H    Infusions: . sodium chloride 20 mL/hr at 10/28/16 0400  . sodium chloride 30 mL/hr at 11/02/16 1900  . amiodarone 60 mg/hr (11/03/16 0824)   Followed by  . amiodarone    . bivalirudin (ANGIOMAX) infusion 0.5 mg/mL (Non-ACS indications) 0.08 mg/kg/hr (11/03/16 0700)  . dexmedetomidine 1.2 mcg/kg/hr (11/03/16 0700)  . feeding supplement (VITAL HIGH PROTEIN) 1,000 mL (11/03/16 0556)  . fentaNYL infusion INTRAVENOUS 400 mcg/hr (11/03/16 0700)  . furosemide (LASIX) infusion 10 mg/hr (11/03/16 0700)  . milrinone 0.1 mcg/kg/min (11/03/16 0700)  . phenylephrine (NEO-SYNEPHRINE) Adult infusion 2 mcg/min (11/03/16 0700)     PRN Medications: acetaminophen (TYLENOL) oral liquid 160 mg/5 mL, Gerhardt's butt cream, ipratropium-albuterol, metoprolol, midazolam, morphine injection, ondansetron (ZOFRAN) IV, sodium chloride flush   Assessment/Plan   Mr Amsden is 70 year old admitted with CP. Complex operation this admission.  CABG x 3 + bioprosthetic AVR for severe bicuspid AS + ascending aorta replacement + MV repair for flail P2.  MV repair complicated by severe SAM, ended up having to remove posterior annuloplasty band.  Chest closed on 12/3.   1. CAD:  10/22/16 S/P CABG x3 with chest left open.  Chest successfully closed am of 10/26/16. - Continue ASA, start statin when LFTs come down.  2. Severe Aortic Stenosis: 11/302017 S/P AVR bioprostheic  3. Mitral regurgitation: Partial flail leaflet with MV repair.  Developed SAM initially after repair and posterior annuloplasty ring had to be removed.  SAM resolved. 4. Acute on chronic systolic CHF:  TEE at time of chest closure showed EF 40-45%, small cavity size, flattened/hypokinetic septum, s/p MV repair with mild MR, mild to moderately decreased RV systolic function, thrombus in RA. Repeat echo 12/7 with EF 60-65%.  Co-ox 65.3% with CVP 15. Remains on phenylephrine 2.5 .  - Continue lasix gtt 10 mg/hr.  - Wean phenylephrine slowly.  - Continue milrinone 0.1 for now, may help with hypotension (EF was normal on most recent echo).  5. ID: ?Source => ID following, do not think endocarditis.   - Concerned digital gangrene is driving this. Fever spiked again yesterday up to 102.  - Now off meropenem and anidulafungin. Vancomycin stopped 11/02/16 - ABX per ID if think warranted.  6. Elevated LFTs: Most likely shock liver.  LFTs now trended down.   - Agree with amiodarone 30 mg/hr.  Transaminitis likely due to shock liver.  - No statin until LFTs come down.  - Abdominal US 10/29/16 with Cholelithiasis without sonographic evidence for acute cholecystitis. No biliary  dilatation. Increased hepatic echogenicity, suggestive of steatosis. and Ascites.   7. Heme: HIT negative, suspect DIC. Platelet count has recovered.  - On bivalirudin for RA thrombus and atrial fibrillation.  - Will need transition to warfarin eventually.  8. VDRF: CCM following for vent management. Pulmonary edema present, ?PNA.  9. Atrial fibrillation: Paroxysmal post-op. He is in and out of atrial fibrillation.  Rate is controlled.   - Continue anticoagulation => right now  on bivalirudin.    - Off amio with LFT elevation. LFTs trending down, may restart if needed for rate control, tolerate HR < 120.  10. PAD: Finger gangrene, mottled toes.  Toes may be looking a bit better.  Unable to doppler PT pulses or get TBIs.  Continues on bivalirudin. .  - May be source of fever  Scott [name], Scott [name] 11/03/2016 9:21 AM  Advanced Heart Failure Team Pager [phone] (M-F; 7a - 4p)  Please contact [facility] for night-coverage after hours (4p -7a ) and weekends on [email or web address]  Patient seen with PA, agree with the above note.  Good co-ox today.  Now off phenylephrine, SBP 90s-100s.  Stop milrinone today.    Still with fevers, ID following think non-infectious.   CVP remains elevated.  Creatinine stable and has been negative on current diuretic regimen, continue.   Agree with restarting amiodarone infusion for rate control, LFTs back down again, rise was likely from shock liver.   Scott [name] 11/03/2016 1:46 PM

## 2016-11-03 NOTE — Progress Notes (Signed)
PULMONARY / CRITICAL CARE MEDICINE   Name: Scott [name] MRN: [id number] DOB: 06-12-1946    ADMISSION DATE:  10/20/2016 CONSULTATION DATE: 12/1  REFERRING MD: Ricard Dillon  CHIEF COMPLAINT:  CAD  HISTORY OF PRESENT ILLNESS:   70 yo former smoker with known CAD and ascending aorta aneurysm, who was taken to OR 11/30 for cabg x 3, aortic root replacement, MVR replacement, thoracic cavity left open and had extended pump time in OR.  SUBJECTIVE:  Tolerating pressure support.  Remains on pressors.  Still requiring high dose sedation.  Still having fever.  VITAL SIGNS: BP (!) 80/66   Pulse 90   Temp 99.7 F (37.6 C)   Resp 20   Ht 5\' 9"  (1.753 m)   Wt 260 lb 5.8 oz (118.1 kg)   SpO2 99%   BMI 38.45 kg/m   HEMODYNAMICS: CVP:  [11 mmHg-18 mmHg] 13 mmHg  VENTILATOR SETTINGS: Vent Mode: CPAP;PSV FiO2 (%):  [40 %] 40 % Set Rate:  [12 bmp] 12 bmp Vt Set:  [570 mL] 570 mL PEEP:  [5 cmH20] 5 cmH20 Pressure Support:  [5 cmH20] 5 cmH20 Plateau Pressure:  [20 [id number] cmH20] 21 cmH20  INTAKE / OUTPUT: I/O last 3 completed shifts: In: 6948.6 [I.V.:4386.9; Other:60; NG/GT:2101.7; IV Piggyback:400] Out: [id number]; Stool:250; Chest Tube:1260]  PHYSICAL EXAMINATION: General: sedated Neuro: RASS -3 HEENT:  ETT in place Cardiovascular: irregular Lungs:  No wheeze Abdomen:  Soft, non tender Ext: 1+ edema, extremities cool Skin: Ischemic changes in fingers b/l   LABS:  BMET  Recent Labs Lab 11/01/16 0415 11/02/16 0400 11/03/16 0449  NA 144 143 145  K 4.3 4.5 4.1  CL 107 107 105  CO2 29 30 34*  BUN 38* 37* 35*  CREATININE 0.79 0.69 0.76  GLUCOSE 159* 179* 120*    Electrolytes  Recent Labs Lab 11/01/16 0415 11/02/16 0400 11/03/16 0449  CALCIUM 7.8* 8.1* 7.9*  MG 2.2 2.2 1.9  PHOS 3.1 3.4  --     CBC  Recent Labs Lab 11/01/16 0415 11/02/16 0400 11/03/16 0449  WBC 13.9* 11.7* 18.1*  HGB 10.4* 9.5* 10.1*  HCT 33.4* 31.2* 33.8*  PLT 236 230 244     Coag's  Recent Labs Lab 11/01/16 0415 11/02/16 0400 11/03/16 0449  APTT 65* 78* 75*  INR 2.40 2.61 2.52    Sepsis Markers  Recent Labs Lab 10/28/16 0500 10/29/16 0930 10/29/16 1628  LATICACIDVEN  --  2.0* 2.0*  PROCALCITON 4.33  --   --     ABG  Recent Labs Lab 10/28/16 0500 10/28/16 2305 10/29/16 0417  PHART 7.468* 7.514* 7.475*  PCO2ART 39.0 33.4 36.8  PO2ART 97.0 90.0 90.0    Liver Enzymes  Recent Labs Lab 11/01/16 0415 11/02/16 0400 11/03/16 0449  AST 73* 51* 46*  ALT 184* 114* 84*  ALKPHOS 119 115 129*  BILITOT 1.5* 1.5* 1.4*  ALBUMIN 2.1* 2.3* 2.1*    Cardiac Enzymes No results for input(s): TROPONINI, PROBNP in the last 168 hours.  Glucose  Recent Labs Lab 11/02/16 1106 11/02/16 1627 11/02/16 1936 11/03/16 0034 11/03/16 0422 11/03/16 0719  GLUCAP 158* 185* 177* 154* 127* 175*    Imaging Dg Chest Port 1 View  Result Date: 11/03/2016 CLINICAL DATA:  Respiratory failure ; status post CABG, aortic valve replacement, mitral valve repair, ascending thoracic aortic aneurysm repair ; sternal closure on October 26, 2016 EXAM: PORTABLE CHEST 1 VIEW COMPARISON:  Portable chest x-ray of November 02, 2016 FINDINGS: The lungs are adequately  inflated. Bilateral chest tubes are present. There is no pneumothorax nor significant pleural effusion. The interstitial markings remain mildly prominent. The cardiac silhouette remains enlarged. The pulmonary vascularity is prominent centrally. The endotracheal tube tip lies approximately 6.1 cm above the carina. The esophagogastric tube tip projects below the inferior margin of the image. The PICC line tip projects over the proximal SVC. There is calcification in the wall of the aortic arch. IMPRESSION: Stable appearance of the chest since yesterday's study. The support tubes are in stable position. Electronically Signed   By: David  Martinique M.D.   On: 11/03/2016 08:31    STUDIES:  12.5 dopplers  lowers>>>neg 12/6 dopplers arms >>>neg 12/7 abdo us>>>Cholelithiasis without sonographic evidence for acute cholecystitis. No biliary dilatation.Increased hepatic echogenicity, suggestive of steatosis. Ascites. 12/7 echo>>>60-65%, rv poor images but appeared wnl  CULTURES: 12/1 bc >>>neg  12/7 sputum>>> normal flora  12/4 BC x 4>>>neg 12/7 BC>>>   ANTIBIOTICS: 11/30 levaquin>>off 12/3 12/3 ceftriaxone>>>12/7 12/3 vanc>>> off 12/7 meropenem>>>off 12/7 eraxis>>>off  SIGNIFICANT EVENTS: 11/30 surgery, hypoxia 12/4 closed chest 12/7 high fevers, LFt up  LINES/TUBES: 11/30 OTT>> 11/30 rt I j PA cath>>12/4 12/4 picc left >>> 11/30 l rad aline>>12/7 11/30 CT x 4>> 11/30 wound vac>>12/4  ASSESSMENT / PLAN:  PULMONARY A: Acute hypoxic respiratory failure after CABG, MVR. P:   Pressure support wean - not stable for extubation trial Likely will need trach to assist with vent weaning F/u CXR  CARDIOVASCULAR A:  S/p CABG, MVR, ascending aortic root replacement. Hypotension. A fib. Rt atrial clot. B/l hand ischemia. P:  Wean pressors to keep MAP > 65 Angiomax Lasix, primacor gtt per cardiology  RENAL A:   Hypervolemia. P:   Lasix gtt to restart 12/11  GASTROINTESTINAL A:   Nutrition. P:   Tube feeds while on vent Protonix for SUP  HEMATOLOGIC A:   DIC - resolved. Anemia of critical illness. P:  F/u CBC   INFECTIOUS A:   Fever 12/06 >> Tm 104F >> cultures negative to date P:   Monitor off Abx per ID  ENDOCRINE A: DM. P:   SSI with lantus  NEUROLOGIC A:   Acute metabolic encephalopathy. P:   RASS goal 0  CC time 32 minutes.  Chesley Mires, MD [facility] Pulmonary/Critical Care 11/03/2016, 11:11 AM Pager:  [phone] After 3pm call: [phone]

## 2016-11-03 NOTE — Progress Notes (Signed)
[facility] BRIEF SICU PROGRESS NOTE  8 Days Post-Op  S/P Procedure(s) (LRB): STERNAL WASHOUT AND DELAYED PRIMARY CLOSURE (N/A) TRANSESOPHAGEAL ECHOCARDIOGRAM (TEE) (N/A)   This evening patient's core temperature and BP dropped transiently and he had a large, foul-smelling liquid stool BP now stable after 1 bolus albumin.  Abdomen soft. UOP remains brisk  Plan: Will check stool for C.diff and start empiric Vanc per tube  I have discussed the patient's current condition and prognosis at length with the patient's wife and son at bedside.  All questions answered.  Rexene Alberts, MD 11/03/2016 6:31 PM

## 2016-11-03 NOTE — Progress Notes (Signed)
ANTICOAGULATION CONSULT NOTE - Follow Up Consult  Pharmacy Consult for Bivalirudin  Indication: possible clot in RA and afib   Allergies  Allergen Reactions  . Doxycycline Hives  . Penicillins Hives     Has patient had a PCN reaction causing immediate rash, facial/tongue/throat swelling, SOB or lightheadedness with hypotension: No Has patient had a PCN reaction causing severe rash involving mucus membranes or skin necrosis: No Has patient had a PCN reaction that required hospitalization: No Has patient had a PCN reaction occurring within the last 10 years: Yes If all of the above answers are "NO", then may proceed with Cephalosporin use.   . Sulfa Antibiotics Hives    Patient Measurements: Height: 5\' 9"  (175.3 cm) Weight: 266 lb 12.1 oz (121 kg) IBW/kg (Calculated) : 70.7  Vital Signs: Temp: 97 F (36.1 C) (12/12 1300) Temp Source: Core (Comment) (12/12 0800) BP: 77/63 (12/12 1440) Pulse Rate: 77 (12/12 1440)  Labs:  Recent Labs  11/01/16 0415 11/02/16 0400 11/03/16 0449  HGB 10.4* 9.5* 10.1*  HCT 33.4* 31.2* 33.8*  PLT 236 230 244  APTT 65* 78* 75*  LABPROT 26.6* 28.4* 27.7*  INR 2.40 2.61 2.52  CREATININE 0.79 0.69 0.76    Estimated Creatinine Clearance: 110.3 mL/min (by C-G formula based on SCr of 0.76 mg/dL).   Medications:  Bivalirudin @ 0.08mg /kg/hr  Assessment: 70yom continues on bivalirudin for possible right atrial clot and afib. APTT is therapeutic at 65 seconds. HIT ruled out (low heparin antibody and SRA negative). Heparin allergy removed.   INR stable 2.5 Aptt at goal 75s, no rate changes needed today  Goal of Therapy:  aPTT 50-85 seconds Monitor platelets by anticoagulation protocol: Yes   Plan:  1) Continue bivalirudin @ 0.08mg /kg/hr 2) Daily APTT 3) Follow up oral anticoagulation  Erin Hearing PharmD., BCPS Clinical Pharmacist Pager [phone] 11/03/2016 2:48 PM

## 2016-11-03 NOTE — Progress Notes (Signed)
RN called d/t pt w/ low minute volume= 4.0 lpm.  Pt placed back on full vent support.

## 2016-11-03 NOTE — Care Management Important Message (Signed)
Important Message  Patient Details  Name: Scott [name] MRN: [id number] Date of Birth: Sep 09, 1946   Medicare Important Message Given:  Yes    Nathen May 11/03/2016, 10:07 AM

## 2016-11-03 NOTE — Progress Notes (Signed)
Patient placed back on full vent support due to increase HR and increase agitation. RT will continue to monitor.

## 2016-11-03 NOTE — Progress Notes (Addendum)
[facility] DAILY ICU PROGRESS NOTE                   [address]            [place] [address]          [phone]   8 Days Post-Op Procedure(s) (LRB): STERNAL WASHOUT AND DELAYED PRIMARY CLOSURE (N/A) TRANSESOPHAGEAL ECHOCARDIOGRAM (TEE) (N/A)  Total Length of Stay:  LOS: 14 days   Subjective: Moving extremities to command at times , but heavily sedated.  Objective: Vital signs in last 24 hours: Temp:  [97.2 F (36.2 C)-102 F (38.9 C)] 102 F (38.9 C) (12/12 0700) Pulse Rate:  [78-146] 146 (12/12 0700) Cardiac Rhythm: Atrial fibrillation (12/12 0700) Resp:  [0-31] 17 (12/12 0700) BP: (81-173)/(52-121) 104/64 (12/12 0700) SpO2:  [97 %-100 %] 98 % (12/12 0700) FiO2 (%):  [40 %] 40 % (12/12 0700)  Filed Weights   10/29/16 0422 10/31/16 0354 11/02/16 0500  Weight: 257 lb 15 oz (117 kg) 259 lb 11.2 oz (117.8 kg) 260 lb 5.8 oz (118.1 kg)    Weight change:    Vent Mode: PRVC FiO2 (%):  [40 %] 40 % Set Rate:  [12 bmp] 12 bmp Vt Set:  [570 mL] 570 mL PEEP:  [5 cmH20] 5 cmH20 Pressure Support:  [5 cmH20] 5 cmH20 Plateau Pressure:  [20 cmH20-21 cmH20] 21 cmH20   Hemodynamic parameters for last 24 hours: CVP:  [11 mmHg-20 mmHg] 12 mmHg  Intake/Output from previous day: 12/11 0701 - 12/12 0700 In: 5127.3 [I.V.:3145.6; NG/GT:1981.7] Out: 6285 [Urine:5545; Chest Tube:740]  Intake/Output this shift: No intake/output data recorded.  Current Meds: Scheduled Meds: . acyclovir  200 mg Oral QID  . aspirin EC  325 mg Oral Daily   Or  . aspirin  324 mg Per Tube Daily  . chlorhexidine gluconate (MEDLINE KIT)  15 mL Mouth Rinse BID  . feeding supplement (PRO-STAT SUGAR FREE 64)  30 mL Per Tube BID  . insulin aspart  0-24 Units Subcutaneous Q4H  . insulin glargine  15 Units Subcutaneous BID  . mouth rinse  15 mL Mouth Rinse QID  . pantoprazole sodium  40 mg Per Tube Q24H  . potassium chloride  40 mEq Per Tube Q12H  . sodium chloride flush  3 mL Intravenous  Q12H   Continuous Infusions: . sodium chloride 20 mL/hr at 10/28/16 0400  . sodium chloride 30 mL/hr at 11/02/16 1900  . bivalirudin (ANGIOMAX) infusion 0.5 mg/mL (Non-ACS indications) 0.08 mg/kg/hr (11/03/16 0700)  . dexmedetomidine 1.2 mcg/kg/hr (11/03/16 0700)  . feeding supplement (VITAL HIGH PROTEIN) 1,000 mL (11/03/16 0556)  . fentaNYL infusion INTRAVENOUS 400 mcg/hr (11/03/16 0700)  . furosemide (LASIX) infusion 10 mg/hr (11/03/16 0700)  . milrinone 0.1 mcg/kg/min (11/03/16 0700)  . phenylephrine (NEO-SYNEPHRINE) Adult infusion 2 mcg/min (11/03/16 0700)   PRN Meds:.acetaminophen (TYLENOL) oral liquid 160 mg/5 mL, Gerhardt's butt cream, ipratropium-albuterol, metoprolol, midazolam, morphine injection, ondansetron (ZOFRAN) IV, sodium chloride flush  General appearance: no distress and heavily sedated Neurologic: grossly intact, sedated Heart: irregularly irregular rhythm and tachy Lungs: some upper airway congestion, mostly clear anteriorly Abdomen: some distension, no guarding or rebound Extremities: hands and feet with ischemic changes bilat Wound: right leg incis with serosang drainage and blistering of wound  Lab Results: CBC: Recent Labs  11/02/16 0400 11/03/16 0449  WBC 11.7* 18.1*  HGB 9.5* 10.1*  HCT 31.2* 33.8*  PLT 230 244   BMET:  Recent Labs  11/02/16 0400 11/03/16  0449  NA 143 145  K 4.5 4.1  CL 107 105  CO2 30 34*  GLUCOSE 179* 120*  BUN 37* 35*  CREATININE 0.69 0.76  CALCIUM 8.1* 7.9*    CMET: Lab Results  Component Value Date   WBC 18.1 (H) 11/03/2016   HGB 10.1 (L) 11/03/2016   HCT 33.8 (L) 11/03/2016   PLT 244 11/03/2016   GLUCOSE 120 (H) 11/03/2016   CHOL 134 10/21/2016   TRIG 203 (H) 10/21/2016   HDL 28 (L) 10/21/2016   LDLCALC 65 10/21/2016   ALT 84 (H) 11/03/2016   AST 46 (H) 11/03/2016   NA 145 11/03/2016   K 4.1 11/03/2016   CL 105 11/03/2016   CREATININE 0.76 11/03/2016   BUN 35 (H) 11/03/2016   CO2 34 (H) 11/03/2016    TSH 1.93 10/19/2016   INR 2.52 11/03/2016   HGBA1C 6.6 (H) 10/21/2016    PT/INR:  Recent Labs  11/03/16 0449  LABPROT 27.7*  INR 2.52   ABG    Component Value Date/Time   PHART 7.475 (H) 10/29/2016 0417   PCO2ART 36.8 10/29/2016 0417   PO2ART 90.0 10/29/2016 0417   HCO3 26.6 10/29/2016 0417   TCO2 28 10/29/2016 0417   ACIDBASEDEF 1.0 10/27/2016 1125   O2SAT 65.3 11/03/2016 0436    Radiology: No results found.   Assessment/Plan: S/P Procedure(s) (LRB): STERNAL WASHOUT AND DELAYED PRIMARY CLOSURE (N/A) TRANSESOPHAGEAL ECHOCARDIOGRAM (TEE) (N/A)   1 atrial fib with RVR, could possibly benefit form Ca++channel blocker, amio not a good option with hepatic dysfunction. Could consider other type 1 antidysrhythmics but not ideal- INR is thearapeudic.  BP variable range , Co-Ox is 65 on low dose neo and 0.1 milrinone- wean as able 2 large tube feed residuals - d/c for now- multifactorial, may need to consider TNA . Protein malnutrition is an issue. Hepatic congestion/shock liver is improved but some consideration of of GB w/u with elevated alk phos/T. Bili and WBC could indicate sludge. Doubt obstructive process.  3 fevers and leukocytosis- multifactorial , ischemic limbs are worrisome but no definite findings of cellulitis or purulence from wounds. ID following- abx have been D/C'd 4 volume overload responding to lasix gtt- good uo, BUN/Creat 5 VDRF- cont to wean per CCM 6 sugars adeq controlled 7 anemia is stable 8 neuro status generally improved encephalopathy   GOLD,WAYNE E 11/03/2016 7:15 AM   I have seen and examined the patient and agree with the assessment and plan as outlined.  Reportedly did well with vent weaning all day yesterday but developed agitation overnight - sedation increased and back on rest mode.  Will need to consider trial extubation versus proceeding directly to tracheostomy soon.  Still in Afib w/ elevated HR at times, especially when sedation decreased.   Discussed w/ Dr. Aundra Dubin - will restart amiodarone.  Doubt that amiodarone had anything to do with elevated transaminases which was likely due to shock liver and has now resolved.  WBC rising rapidly and fevers up this morning since all antibiotics stopped yesterday by ID team.  May need to resume empiric antibiotics but watch for now and reculture.  Dry gangrenous changes fingers and toes all 4 extremities remain w/out signs of transformation to wet gangrene - no reason to consider amputation at this time.  Right thigh incision w/ skin edge necrosis, serous drainage and mild erythema.  Doesn't appear infected but will culture drainage.  Sternal incision looks good.  CXR remarkably clear.  Rexene Alberts, MD 11/03/2016  8:15 AM

## 2016-11-03 NOTE — Care Management Note (Signed)
Case Management Note  Patient Details  Name: Scott [name] MRN: [id number] Date of Birth: 1945-12-16  Subjective/Objective:   S/p heart cath, pta indep, lives with wife.  NCM will cont to follow for dc needs.                  Action/Plan:   Expected Discharge Date:                  Expected Discharge Plan:  Home/Self Care  In-House Referral:     Discharge planning Services  CM Consult  Post Acute Care Choice:    Choice offered to:     DME Arranged:    DME Agency:     HH Arranged:    HH Agency:     Status of Service:  Completed, signed off  If discussed at [employer] of Stay Meetings, dates discussed:    Additional Comments: 11/03/2016  Discussed in LOS 11/03/16:  Remains appropriate for continued stay.  Pt remains on milrinone, IV lasix, aggristat and IV antibiotics  10/29/16 Discussed in LOS 10/29/16:  Pt remains appropriate for continued stay.  3 days s/p sternal washout and closure.  Remains on milrinone, lasix drip Maryclare Labrador, RN 11/03/2016, 9:49 AM

## 2016-11-03 NOTE — Progress Notes (Signed)
INFECTIOUS DISEASE PROGRESS NOTE  ID: Scott [name] is a 70 y.o. male with  Principal Problem:   S/P aortic valve replacement + mitral valve repair + CABG x3 + repair of ascending thoracic aortic aneurysm Active Problems:   Severe aortic stenosis by prior echocardiography   Unstable angina (HCC)   Severe aortic stenosis   Coronary artery disease involving native coronary artery of native heart with unstable angina pectoris (HCC)   Bicuspid aortic valve   Thoracic ascending aortic aneurysm (HCC)   Mitral regurgitation due to cusp prolapse   S/P ascending aortic aneurysm repair   S/P mitral valve repair   Hx of CABG   Cardiogenic shock (HCC)   Postoperative atrial fibrillation (HCC)   Thrombocytopenia (HCC)   Acute combined systolic and diastolic congestive heart failure (HCC)   Acute respiratory failure (HCC)   DIC (disseminated intravascular coagulation) (HCC)   Elevated LFTs   Acute encephalopathy   Fever  Subjective: No response. Per nursing he follows simple commands (wiggles toes)  Abtx:  Anti-infectives    Start     Dose/Rate Route Frequency Ordered Stop   11/01/16 1400  acyclovir (ZOVIRAX) 200 MG/5ML suspension SUSP 200 mg     200 mg Oral 4 times daily 11/01/16 1200     10/30/16 0927  anidulafungin (ERAXIS) 100 mg in sodium chloride 0.9 % 100 mL IVPB  Status:  Discontinued     100 mg over 90 Minutes Intravenous Every 24 hours 10/29/16 0927 10/31/16 1430   10/29/16 1000  meropenem (MERREM) 1 g in sodium chloride 0.9 % 100 mL IVPB  Status:  Discontinued     1 g 200 mL/hr over 30 Minutes Intravenous Every 8 hours 10/29/16 0931 10/31/16 1430   10/29/16 0930  anidulafungin (ERAXIS) 200 mg in sodium chloride 0.9 % 200 mL IVPB     200 mg over 180 Minutes Intravenous  Once 10/29/16 0927 10/29/16 1349   10/29/16 0600  vancomycin (VANCOCIN) 1,250 mg in sodium chloride 0.9 % 250 mL IVPB  Status:  Discontinued     1,250 mg 166.7 mL/hr over 90 Minutes Intravenous Every  24 hours 10/28/16 0900 11/02/16 0945   10/28/16 0600  vancomycin (VANCOCIN) IVPB 750 mg/150 ml premix  Status:  Discontinued     750 mg 150 mL/hr over 60 Minutes Intravenous Every 12 hours 10/27/16 1226 10/28/16 0900   10/27/16 1400  cefTRIAXone (ROCEPHIN) 2 g in dextrose 5 % 50 mL IVPB  Status:  Discontinued     2 g 100 mL/hr over 30 Minutes Intravenous Every 24 hours 10/27/16 1203 10/29/16 0907   10/27/16 1400  rifampin (RIFADIN) 300 mg in sodium chloride 0.9 % 100 mL IVPB  Status:  Discontinued     300 mg 200 mL/hr over 30 Minutes Intravenous Every 8 hours 10/27/16 1226 10/27/16 1426   10/27/16 1330  vancomycin (VANCOCIN) 2,000 mg in sodium chloride 0.9 % 500 mL IVPB     2,000 mg 250 mL/hr over 120 Minutes Intravenous  Once 10/27/16 1226 10/27/16 1451   10/27/16 1000  levofloxacin (LEVAQUIN) IVPB 750 mg  Status:  Discontinued     750 mg 100 mL/hr over 90 Minutes Intravenous Every 24 hours 10/26/16 1054 10/27/16 1203   10/26/16 1815  vancomycin (VANCOCIN) IVPB 1000 mg/200 mL premix     1,000 mg 200 mL/hr over 60 Minutes Intravenous  Once 10/26/16 1054 10/26/16 1752   10/26/16 0954  polymyxin B 500,000 Units, bacitracin 50,000 Units in sodium chloride irrigation  0.9 % 500 mL irrigation  Status:  Discontinued       As needed 10/26/16 0954 10/26/16 1030   10/26/16 0400  vancomycin (VANCOCIN) 1,500 mg in sodium chloride 0.9 % 250 mL IVPB     1,500 mg 125 mL/hr over 120 Minutes Intravenous To Surgery 10/25/16 1145 10/26/16 0925   10/26/16 0400  vancomycin (VANCOCIN) 1,000 mg in sodium chloride 0.9 % 1,000 mL irrigation      Irrigation To Surgery 10/25/16 1145 10/26/16 0953   10/26/16 0400  levofloxacin (LEVAQUIN) IVPB 500 mg     500 mg 100 mL/hr over 60 Minutes Intravenous To Surgery 10/25/16 1145 10/26/16 0925   10/23/16 1000  levofloxacin (LEVAQUIN) IVPB 750 mg     750 mg 100 mL/hr over 90 Minutes Intravenous Every 24 hours 10/22/16 2130 10/23/16 1047   10/23/16 0515  vancomycin  (VANCOCIN) IVPB 1000 mg/200 mL premix     1,000 mg 200 mL/hr over 60 Minutes Intravenous  Once 10/22/16 2130 10/23/16 0600   10/22/16 2045  vancomycin (VANCOCIN) 1,250 mg in sodium chloride 0.9 % 250 mL IVPB  Status:  Discontinued     1,250 mg 166.7 mL/hr over 90 Minutes Intravenous To Surgery 10/22/16 2043 10/22/16 2130   10/22/16 2045  levofloxacin (LEVAQUIN) IVPB 500 mg  Status:  Discontinued     500 mg 100 mL/hr over 60 Minutes Intravenous To Surgery 10/22/16 2043 10/22/16 2130   10/22/16 0400  vancomycin (VANCOCIN) 1,250 mg in sodium chloride 0.9 % 250 mL IVPB     1,250 mg 166.7 mL/hr over 90 Minutes Intravenous To Surgery 10/21/16 1305 10/22/16 2247   10/22/16 0400  vancomycin (VANCOCIN) 1,000 mg in sodium chloride 0.9 % 1,000 mL irrigation      Irrigation To Surgery 10/21/16 1305 10/22/16 0819   10/22/16 0400  levofloxacin (LEVAQUIN) IVPB 500 mg     500 mg 100 mL/hr over 60 Minutes Intravenous To Surgery 10/21/16 1305 10/22/16 2114      Medications:  Scheduled: . acyclovir  200 mg Oral QID  . aspirin EC  325 mg Oral Daily   Or  . aspirin  324 mg Per Tube Daily  . chlorhexidine gluconate (MEDLINE KIT)  15 mL Mouth Rinse BID  . feeding supplement (PRO-STAT SUGAR FREE 64)  30 mL Per Tube BID  . insulin aspart  0-24 Units Subcutaneous Q4H  . insulin glargine  15 Units Subcutaneous BID  . mouth rinse  15 mL Mouth Rinse QID  . pantoprazole sodium  40 mg Per Tube Q24H  . potassium chloride  40 mEq Per Tube Q12H  . sodium chloride flush  3 mL Intravenous Q12H    Objective: Vital signs in last 24 hours: Temp:  [97.3 F (36.3 C)-102 F (38.9 C)] 101.8 F (38.8 C) (12/12 0730) Pulse Rate:  [78-146] 111 (12/12 0730) Resp:  [0-31] 27 (12/12 0730) BP: (81-173)/(52-121) 91/64 (12/12 0730) SpO2:  [97 %-100 %] 98 % (12/12 0800) FiO2 (%):  [40 %] 40 % (12/12 0800)  Erythema on upper lip General appearance: no distress and sedated, intubated Resp: good air mvmt. no  rhonchi Chest wall: midline wound clean.  Cardio: regular rate and rhythm GI: normal findings: bowel sounds normal and soft, non-tender Extremities: right thigh incision site had some drainage but now appears intact and dry. livido, several fingers with dry gangrenous changes. cold feet.   Lab Results  Recent Labs  11/02/16 0400 11/03/16 0449  WBC 11.7* 18.1*  HGB 9.5* 10.1*  HCT 31.2* 33.8*  NA 143 145  K 4.5 4.1  CL 107 105  CO2 30 34*  BUN 37* 35*  CREATININE 0.69 0.76   Liver Panel  Recent Labs  11/02/16 0400 11/03/16 0449  PROT 5.0* 4.9*  ALBUMIN 2.3* 2.1*  AST 51* 46*  ALT 114* 84*  ALKPHOS 115 129*  BILITOT 1.5* 1.4*   Sedimentation Rate No results for input(s): ESRSEDRATE in the last 72 hours. C-Reactive Protein No results for input(s): CRP in the last 72 hours.  Microbiology: Recent Results (from the past 240 hour(s))  Culture, blood (routine x 2)     Status: None   Collection Time: 10/26/16 12:45 PM  Result Value Ref Range Status   Specimen Description BLOOD RIGHT ANTECUBITAL  Final   Special Requests IN PEDIATRIC BOTTLE 1.5CC  Final   Culture NO GROWTH 5 DAYS  Final   Report Status 10/31/2016 FINAL  Final  Culture, blood (routine x 2)     Status: None   Collection Time: 10/26/16 12:50 PM  Result Value Ref Range Status   Specimen Description BLOOD RIGHT HAND  Final   Special Requests IN PEDIATRIC BOTTLE 3CC  Final   Culture NO GROWTH 5 DAYS  Final   Report Status 10/31/2016 FINAL  Final  Culture, blood (Routine X 2) w Reflex to ID Panel     Status: None (Preliminary result)   Collection Time: 10/29/16  2:47 AM  Result Value Ref Range Status   Specimen Description BLOOD RIGHT HAND  Final   Special Requests IN PEDIATRIC BOTTLE 1CC  Final   Culture NO GROWTH 4 DAYS  Final   Report Status PENDING  Incomplete  Culture, blood (Routine X 2) w Reflex to ID Panel     Status: None (Preliminary result)   Collection Time: 10/29/16  5:51 AM  Result Value  Ref Range Status   Specimen Description BLOOD RIGHT ARM  Final   Special Requests BOTTLES DRAWN AEROBIC AND ANAEROBIC 5CC   Final   Culture NO GROWTH 4 DAYS  Final   Report Status PENDING  Incomplete  Culture, respiratory (NON-Expectorated)     Status: None   Collection Time: 10/29/16 10:21 AM  Result Value Ref Range Status   Specimen Description TRACHEAL ASPIRATE  Final   Special Requests NONE  Final   Gram Stain   Final    FEW WBC PRESENT, PREDOMINANTLY PMN MODERATE GRAM NEGATIVE RODS RARE GRAM POSITIVE COCCI    Culture Consistent with normal respiratory flora.  Final   Report Status 10/31/2016 FINAL  Final  Culture, respiratory (NON-Expectorated)     Status: None (Preliminary result)   Collection Time: 10/31/16 12:37 PM  Result Value Ref Range Status   Specimen Description TRACHEAL ASPIRATE  Final   Special Requests Normal  Final   Gram Stain   Final    RARE WBC PRESENT, PREDOMINANTLY MONONUCLEAR RARE GRAM NEGATIVE COCCOBACILLI    Culture CULTURE REINCUBATED FOR BETTER GROWTH  Final   Report Status PENDING  Incomplete  Culture, blood (single) w Reflex to ID Panel     Status: None (Preliminary result)   Collection Time: 10/31/16  2:08 PM  Result Value Ref Range Status   Specimen Description BLOOD RIGHT ANTECUBITAL  Final   Special Requests IN PEDIATRIC BOTTLE 3CC  Final   Culture NO GROWTH 2 DAYS  Final   Report Status PENDING  Incomplete    Studies/Results: Dg Chest Port 1 View  Result Date: 11/03/2016 CLINICAL DATA:  Respiratory  failure ; status post CABG, aortic valve replacement, mitral valve repair, ascending thoracic aortic aneurysm repair ; sternal closure on October 26, 2016 EXAM: PORTABLE CHEST 1 VIEW COMPARISON:  Portable chest x-ray of November 02, 2016 FINDINGS: The lungs are adequately inflated. Bilateral chest tubes are present. There is no pneumothorax nor significant pleural effusion. The interstitial markings remain mildly prominent. The cardiac silhouette  remains enlarged. The pulmonary vascularity is prominent centrally. The endotracheal tube tip lies approximately 6.1 cm above the carina. The esophagogastric tube tip projects below the inferior margin of the image. The PICC line tip projects over the proximal SVC. There is calcification in the wall of the aortic arch. IMPRESSION: Stable appearance of the chest since yesterday's study. The support tubes are in stable position. Electronically Signed   By: David  Martinique M.D.   On: 11/03/2016 08:31   Dg Chest Port 1 View  Result Date: 11/02/2016 CLINICAL DATA:  Status post CABG, aortic valve replacement and mitral valve repair, and ascending thoracic aortic aneurysm repair on October 22, 2016. EXAM: PORTABLE CHEST 1 VIEW COMPARISON:  Portable chest x-ray of November 01, 2016 FINDINGS: The lungs are mildly hypoinflated. The interstitial markings are more conspicuous today. No pneumothorax is observed. There is no large pleural effusion. The bilateral chest tubes are in stable position. The endotracheal tube tip projects approximately 4.3 cm above the carina. The esophagogastric tube tip projects below the inferior margin of the image. The sternal wires appear intact. The cardiac silhouette is enlarged. The pulmonary vascularity is engorged and indistinct. IMPRESSION: Slight interval deterioration in the appearance of the pulmonary interstitium bilaterally compatible with increased interstitial edema though certainly interstitial pneumonia is not excluded. There is no large pleural effusion and no pneumothorax is observed. Cardiomegaly with mild central pulmonary vascular congestion. The support tubes are in reasonable position. Electronically Signed   By: David  Martinique M.D.   On: 11/02/2016 07:31     Assessment/Plan: Post cardiotomy shock             CABG, MVR, AVR, aneurysm repair   -weaned off levo this AM. On lasix gtt. Fever post cardiac surgery - likely noninfectious. Cultures negative.  Vegetation  vs Clot in RA Hepatitis -  Likely from shock, improving.   Total days of antibiotics: 7 days of vanc off 12/11. (Off merrem/anidulafungin)  Tmax remains elevated 102. Cultures remain negative. His source of fever is likely noninfectious, could be from the clots/peripheral ischemia (RA clot, has dry gangrene on hands now), hepatitis. Less likely from his thigh incision site. Continue to hold abx for now.   Consider imaging for sternotomy infection.  Consider imaging for ileus          [name], [name] Infectious Diseases (pager) [phone] [email or web address] 11/03/2016, 9:17 AM  LOS: 14 days

## 2016-11-03 NOTE — Progress Notes (Signed)
Nutrition Follow Up  DOCUMENTATION CODES:   Obesity unspecified  INTERVENTION:    Continue Vital High Protein at goal rate of 55 ml/h and Prostat 30 ml BID to provide 1520 kcals, 145 gm protein, 1103 ml free water daily  NUTRITION DIAGNOSIS:   Inadequate oral intake related to inability to eat as evidenced by NPO status, ongoing  GOAL:    Provide needs based on ASPEN/SCCM guidelines, met   MONITOR:   TF tolerance, Vent status, Labs, Weight trends, Skin, I & O's  ASSESSMENT:   70 yo former smoker with known CAD and ascending aorta aneurysm, who was taken to OR 11/30 for cabg x 3, aortic root replacement, MVR replacement, thoracic cavity left open and had extended pump time in OR. On APRV ventilation post op, CI 1.6 and multiple pressors. PCCM asked to manage vent.   Pt s/p procedures 11/30: AORTIC VALVE REPLACEMENT MITRAL VALVE REPAIR REPAIR OF ASCENDING THORACIC AORTIC ANEURYSM CORONARY ARTERY BYPASS GRAFTING x 3  Patient is currently intubated on ventilator support Temp (24hrs), Avg:99.5 F (37.5 C), Min:97.3 F (36.3 C), Max:102 F (38.9 C)  Pt went back to OR 12/4 for sternal washout and delayed primary closure. Vital High Protein re-started 12/6 >> infusing at goal rate of 55 ml/hr. Tolerating pressure support >> remains on pressors. Will likely need trach to assist with vent weaning. CBG's [id number].  Diet Order:  Diet NPO time specified  Skin:  Wound (see comment) (sternal wound VAC)  Last BM:  12/11  Height:   Ht Readings from Last 1 Encounters:  10/23/16 '5\' 9"'  (1.753 m)    Weight:   Wt Readings from Last 1 Encounters:  11/02/16 260 lb 5.8 oz (118.1 kg)    Ideal Body Weight:  72.7 kg  BMI:  Body mass index is 38.45 kg/m.  Estimated Nutritional Needs:   Kcal:  8882-8003  Protein:  145-155 gm  Fluid:  per MD  EDUCATION NEEDS:   No education needs identified at this time  Arthur Holms, RD, LDN Pager #: [phone] After-Hours  Pager #: [phone]

## 2016-11-04 ENCOUNTER — Inpatient Hospital Stay (HOSPITAL_COMMUNITY): Payer: Medicare Other

## 2016-11-04 DIAGNOSIS — A0472 Enterocolitis due to Clostridium difficile, not specified as recurrent: Secondary | ICD-10-CM

## 2016-11-04 DIAGNOSIS — R601 Generalized edema: Secondary | ICD-10-CM

## 2016-11-04 LAB — GLUCOSE, CAPILLARY
GLUCOSE-CAPILLARY: 117 mg/dL — AB (ref 65–99)
GLUCOSE-CAPILLARY: 130 mg/dL — AB (ref 65–99)
GLUCOSE-CAPILLARY: 182 mg/dL — AB (ref 65–99)
GLUCOSE-CAPILLARY: 183 mg/dL — AB (ref 65–99)
GLUCOSE-CAPILLARY: 291 mg/dL — AB (ref 65–99)
Glucose-Capillary: 152 mg/dL — ABNORMAL HIGH (ref 65–99)
Glucose-Capillary: 165 mg/dL — ABNORMAL HIGH (ref 65–99)

## 2016-11-04 LAB — URINALYSIS, ROUTINE W REFLEX MICROSCOPIC
Bilirubin Urine: NEGATIVE
Glucose, UA: NEGATIVE mg/dL
Hgb urine dipstick: NEGATIVE
Ketones, ur: NEGATIVE mg/dL
LEUKOCYTES UA: NEGATIVE
NITRITE: NEGATIVE
PH: 5 (ref 5.0–8.0)
Protein, ur: NEGATIVE mg/dL
SPECIFIC GRAVITY, URINE: 1.01 (ref 1.005–1.030)

## 2016-11-04 LAB — POCT I-STAT 3, ART BLOOD GAS (G3+)
ACID-BASE EXCESS: 11 mmol/L — AB (ref 0.0–2.0)
Bicarbonate: 34.4 mmol/L — ABNORMAL HIGH (ref 20.0–28.0)
O2 SAT: 100 %
PH ART: 7.511 — AB (ref 7.350–7.450)
Patient temperature: 100.9
TCO2: 36 mmol/L (ref 0–100)
pCO2 arterial: 43.5 mmHg (ref 32.0–48.0)
pO2, Arterial: 274 mmHg — ABNORMAL HIGH (ref 83.0–108.0)

## 2016-11-04 LAB — COOXEMETRY PANEL
Carboxyhemoglobin: 1 % (ref 0.5–1.5)
Methemoglobin: 1.2 % (ref 0.0–1.5)
O2 Saturation: 57.4 %
Total hemoglobin: 9.6 g/dL — ABNORMAL LOW (ref 12.0–16.0)

## 2016-11-04 LAB — BASIC METABOLIC PANEL
ANION GAP: 7 (ref 5–15)
BUN: 39 mg/dL — AB (ref 6–20)
CHLORIDE: 109 mmol/L (ref 101–111)
CO2: 31 mmol/L (ref 22–32)
Calcium: 8.1 mg/dL — ABNORMAL LOW (ref 8.9–10.3)
Creatinine, Ser: 0.78 mg/dL (ref 0.61–1.24)
GFR calc Af Amer: >60 mL/min (ref >60)
GFR calc non Af Amer: >60 mL/min (ref >60)
GLUCOSE: 160 mg/dL — AB (ref 65–99)
POTASSIUM: 3.7 mmol/L (ref 3.5–5.1)
Sodium: 147 mmol/L — ABNORMAL HIGH (ref 135–145)

## 2016-11-04 LAB — APTT: aPTT: 73 seconds — ABNORMAL HIGH (ref 24–36)

## 2016-11-04 LAB — CBC
HEMATOCRIT: 33.5 % — AB (ref 39.0–52.0)
HEMOGLOBIN: 10.1 g/dL — AB (ref 13.0–17.0)
MCH: 28.5 pg (ref 26.0–34.0)
MCHC: 30.1 g/dL (ref 30.0–36.0)
MCV: 94.6 fL (ref 78.0–100.0)
Platelets: 279 10*3/uL (ref 150–400)
RBC: 3.54 MIL/uL — ABNORMAL LOW (ref 4.22–5.81)
RDW: 17.6 % — AB (ref 11.5–15.5)
WBC: 12.5 10*3/uL — AB (ref 4.0–10.5)

## 2016-11-04 LAB — PROTIME-INR
INR: 1.88
PROTHROMBIN TIME: 21.8 s — AB (ref 11.4–15.2)

## 2016-11-04 LAB — HEPARIN LEVEL (UNFRACTIONATED): Heparin Unfractionated: <0.1 IU/mL — ABNORMAL LOW (ref 0.30–0.70)

## 2016-11-04 MED ORDER — SODIUM CHLORIDE 0.9 % IV SOLN
30.0000 meq | Freq: Once | INTRAVENOUS | Status: AC
  Administered 2016-11-04: 30 meq via INTRAVENOUS
  Filled 2016-11-04: qty 15

## 2016-11-04 MED ORDER — HEPARIN (PORCINE) IN NACL 100-0.45 UNIT/ML-% IJ SOLN
1950.0000 [IU]/h | INTRAMUSCULAR | Status: DC
  Administered 2016-11-04 (×2): 1500 [IU]/h via INTRAVENOUS
  Administered 2016-11-05: 1950 [IU]/h via INTRAVENOUS
  Administered 2016-11-05: 1750 [IU]/h via INTRAVENOUS
  Filled 2016-11-04 (×3): qty 250

## 2016-11-04 MED ORDER — METRONIDAZOLE 50 MG/ML ORAL SUSPENSION
500.0000 mg | Freq: Three times a day (TID) | ORAL | Status: DC
  Administered 2016-11-04 – 2016-11-10 (×19): 500 mg
  Filled 2016-11-04 (×22): qty 10

## 2016-11-04 NOTE — Progress Notes (Signed)
INFECTIOUS DISEASE PROGRESS NOTE  ID: Scott [name] is a 70 y.o. male with  Principal Problem:   S/P aortic valve replacement + mitral valve repair + CABG x3 + repair of ascending thoracic aortic aneurysm Active Problems:   Severe aortic stenosis by prior echocardiography   Unstable angina (HCC)   Severe aortic stenosis   Coronary artery disease involving native coronary artery of native heart with unstable angina pectoris (HCC)   Bicuspid aortic valve   Thoracic ascending aortic aneurysm (HCC)   Mitral regurgitation due to cusp prolapse   S/P ascending aortic aneurysm repair   S/P mitral valve repair   Hx of CABG   Cardiogenic shock (HCC)   Postoperative atrial fibrillation (HCC)   Thrombocytopenia (HCC)   Acute combined systolic and diastolic congestive heart failure (HCC)   Acute respiratory failure (HCC)   DIC (disseminated intravascular coagulation) (HCC)   Elevated LFTs   Acute encephalopathy   Fever  Subjective: Hypothermic, hypotensive overnight. Responds intermittently to the RN per report. Unresponsive to me. cdiff +, started on PO vanc yesterday evening.   Abtx:  Anti-infectives    Start     Dose/Rate Route Frequency Ordered Stop   11/04/16 0845  metroNIDAZOLE (FLAGYL) 50 mg/ml oral suspension 500 mg     500 mg Per Tube 3 times daily 11/04/16 0831     11/03/16 2000  vancomycin (VANCOCIN) 50 mg/mL oral solution 500 mg     500 mg Per Tube Every 6 hours 11/03/16 1812 11/17/16 1759   11/01/16 1400  acyclovir (ZOVIRAX) 200 MG/5ML suspension SUSP 200 mg     200 mg Oral 4 times daily 11/01/16 1200     10/30/16 0927  anidulafungin (ERAXIS) 100 mg in sodium chloride 0.9 % 100 mL IVPB  Status:  Discontinued     100 mg over 90 Minutes Intravenous Every 24 hours 10/29/16 0927 10/31/16 1430   10/29/16 1000  meropenem (MERREM) 1 g in sodium chloride 0.9 % 100 mL IVPB  Status:  Discontinued     1 g 200 mL/hr over 30 Minutes Intravenous Every 8 hours 10/29/16 0931  10/31/16 1430   10/29/16 0930  anidulafungin (ERAXIS) 200 mg in sodium chloride 0.9 % 200 mL IVPB     200 mg over 180 Minutes Intravenous  Once 10/29/16 0927 10/29/16 1349   10/29/16 0600  vancomycin (VANCOCIN) 1,250 mg in sodium chloride 0.9 % 250 mL IVPB  Status:  Discontinued     1,250 mg 166.7 mL/hr over 90 Minutes Intravenous Every 24 hours 10/28/16 0900 11/02/16 0945   10/28/16 0600  vancomycin (VANCOCIN) IVPB 750 mg/150 ml premix  Status:  Discontinued     750 mg 150 mL/hr over 60 Minutes Intravenous Every 12 hours 10/27/16 1226 10/28/16 0900   10/27/16 1400  cefTRIAXone (ROCEPHIN) 2 g in dextrose 5 % 50 mL IVPB  Status:  Discontinued     2 g 100 mL/hr over 30 Minutes Intravenous Every 24 hours 10/27/16 1203 10/29/16 0907   10/27/16 1400  rifampin (RIFADIN) 300 mg in sodium chloride 0.9 % 100 mL IVPB  Status:  Discontinued     300 mg 200 mL/hr over 30 Minutes Intravenous Every 8 hours 10/27/16 1226 10/27/16 1426   10/27/16 1330  vancomycin (VANCOCIN) 2,000 mg in sodium chloride 0.9 % 500 mL IVPB     2,000 mg 250 mL/hr over 120 Minutes Intravenous  Once 10/27/16 1226 10/27/16 1451   10/27/16 1000  levofloxacin (LEVAQUIN) IVPB 750 mg  Status:  Discontinued     750 mg 100 mL/hr over 90 Minutes Intravenous Every 24 hours 10/26/16 1054 10/27/16 1203   10/26/16 1815  vancomycin (VANCOCIN) IVPB 1000 mg/200 mL premix     1,000 mg 200 mL/hr over 60 Minutes Intravenous  Once 10/26/16 1054 10/26/16 1752   10/26/16 0954  polymyxin B 500,000 Units, bacitracin 50,000 Units in sodium chloride irrigation 0.9 % 500 mL irrigation  Status:  Discontinued       As needed 10/26/16 0954 10/26/16 1030   10/26/16 0400  vancomycin (VANCOCIN) 1,500 mg in sodium chloride 0.9 % 250 mL IVPB     1,500 mg 125 mL/hr over 120 Minutes Intravenous To Surgery 10/25/16 1145 10/26/16 0925   10/26/16 0400  vancomycin (VANCOCIN) 1,000 mg in sodium chloride 0.9 % 1,000 mL irrigation      Irrigation To Surgery 10/25/16  1145 10/26/16 0953   10/26/16 0400  levofloxacin (LEVAQUIN) IVPB 500 mg     500 mg 100 mL/hr over 60 Minutes Intravenous To Surgery 10/25/16 1145 10/26/16 0925   10/23/16 1000  levofloxacin (LEVAQUIN) IVPB 750 mg     750 mg 100 mL/hr over 90 Minutes Intravenous Every 24 hours 10/22/16 2130 10/23/16 1047   10/23/16 0515  vancomycin (VANCOCIN) IVPB 1000 mg/200 mL premix     1,000 mg 200 mL/hr over 60 Minutes Intravenous  Once 10/22/16 2130 10/23/16 0600   10/22/16 2045  vancomycin (VANCOCIN) 1,250 mg in sodium chloride 0.9 % 250 mL IVPB  Status:  Discontinued     1,250 mg 166.7 mL/hr over 90 Minutes Intravenous To Surgery 10/22/16 2043 10/22/16 2130   10/22/16 2045  levofloxacin (LEVAQUIN) IVPB 500 mg  Status:  Discontinued     500 mg 100 mL/hr over 60 Minutes Intravenous To Surgery 10/22/16 2043 10/22/16 2130   10/22/16 0400  vancomycin (VANCOCIN) 1,250 mg in sodium chloride 0.9 % 250 mL IVPB     1,250 mg 166.7 mL/hr over 90 Minutes Intravenous To Surgery 10/21/16 1305 10/22/16 2247   10/22/16 0400  vancomycin (VANCOCIN) 1,000 mg in sodium chloride 0.9 % 1,000 mL irrigation      Irrigation To Surgery 10/21/16 1305 10/22/16 0819   10/22/16 0400  levofloxacin (LEVAQUIN) IVPB 500 mg     500 mg 100 mL/hr over 60 Minutes Intravenous To Surgery 10/21/16 1305 10/22/16 2114      Medications:  Scheduled: . acyclovir  200 mg Oral QID  . aspirin EC  325 mg Oral Daily   Or  . aspirin  324 mg Per Tube Daily  . chlorhexidine gluconate (MEDLINE KIT)  15 mL Mouth Rinse BID  . feeding supplement (PRO-STAT SUGAR FREE 64)  30 mL Per Tube BID  . free water  200 mL Per Tube Q8H  . insulin aspart  0-24 Units Subcutaneous Q4H  . insulin glargine  15 Units Subcutaneous BID  . mouth rinse  15 mL Mouth Rinse QID  . metroNIDAZOLE  500 mg Per Tube TID  . pantoprazole sodium  40 mg Per Tube Q24H  . potassium chloride  40 mEq Per Tube Q12H  . potassium chloride (KCL MULTIRUN) 30 mEq in 265 mL IVPB  30 mEq  Intravenous Once  . sodium chloride flush  3 mL Intravenous Q12H  . vancomycin  500 mg Per Tube Q6H    Objective: Vital signs in last 24 hours: Temp:  [95.4 F (35.2 C)-101.1 F (38.4 C)] 97.9 F (36.6 C) (12/13 0715) Pulse Rate:  [69-124] 86 (  12/13 0715) Resp:  [10-32] 18 (12/13 0715) BP: (58-133)/(39-114) 108/81 (12/13 0715) SpO2:  [99 %-100 %] 100 % (12/13 0715) FiO2 (%):  [40 %] 40 % (12/13 0400) Weight:  [255 lb 4.7 oz (115.8 kg)-266 lb 12.1 oz (121 kg)] 255 lb 4.7 oz (115.8 kg) (12/13 0315)  Erythema on upper lip General appearance: no distress and sedated, intubated Resp: good air mvmt. no rhonchi Chest wall: midline wound clean.  Cardio: regular rate and rhythm GI: normal BS. mild distension. soft.  Extremities: right thigh incision site had some drainage but now appears intact and dry. livido, several fingers with dry gangrenous changes. cold feet.   Lab Results  Recent Labs  11/03/16 0449 11/04/16 0400  WBC 18.1* 12.5*  HGB 10.1* 10.1*  HCT 33.8* 33.5*  NA 145 147*  K 4.1 3.7  CL 105 109  CO2 34* 31  BUN 35* 39*  CREATININE 0.76 0.78   Liver Panel  Recent Labs  11/02/16 0400 11/03/16 0449  PROT 5.0* 4.9*  ALBUMIN 2.3* 2.1*  AST 51* 46*  ALT 114* 84*  ALKPHOS 115 129*  BILITOT 1.5* 1.4*   Sedimentation Rate No results for input(s): ESRSEDRATE in the last 72 hours. C-Reactive Protein No results for input(s): CRP in the last 72 hours.  Microbiology: Recent Results (from the past 240 hour(s))  Culture, blood (routine x 2)     Status: None   Collection Time: 10/26/16 12:45 PM  Result Value Ref Range Status   Specimen Description BLOOD RIGHT ANTECUBITAL  Final   Special Requests IN PEDIATRIC BOTTLE 1.5CC  Final   Culture NO GROWTH 5 DAYS  Final   Report Status 10/31/2016 FINAL  Final  Culture, blood (routine x 2)     Status: None   Collection Time: 10/26/16 12:50 PM  Result Value Ref Range Status   Specimen Description BLOOD RIGHT HAND   Final   Special Requests IN PEDIATRIC BOTTLE 3CC  Final   Culture NO GROWTH 5 DAYS  Final   Report Status 10/31/2016 FINAL  Final  Culture, blood (Routine X 2) w Reflex to ID Panel     Status: None   Collection Time: 10/29/16  2:47 AM  Result Value Ref Range Status   Specimen Description BLOOD RIGHT HAND  Final   Special Requests IN PEDIATRIC BOTTLE 1CC  Final   Culture NO GROWTH 5 DAYS  Final   Report Status 11/03/2016 FINAL  Final  Culture, blood (Routine X 2) w Reflex to ID Panel     Status: None   Collection Time: 10/29/16  5:51 AM  Result Value Ref Range Status   Specimen Description BLOOD RIGHT ARM  Final   Special Requests BOTTLES DRAWN AEROBIC AND ANAEROBIC 5CC   Final   Culture NO GROWTH 5 DAYS  Final   Report Status 11/03/2016 FINAL  Final  Culture, respiratory (NON-Expectorated)     Status: None   Collection Time: 10/29/16 10:21 AM  Result Value Ref Range Status   Specimen Description TRACHEAL ASPIRATE  Final   Special Requests NONE  Final   Gram Stain   Final    FEW WBC PRESENT, PREDOMINANTLY PMN MODERATE GRAM NEGATIVE RODS RARE GRAM POSITIVE COCCI    Culture Consistent with normal respiratory flora.  Final   Report Status 10/31/2016 FINAL  Final  Culture, respiratory (NON-Expectorated)     Status: None   Collection Time: 10/31/16 12:37 PM  Result Value Ref Range Status   Specimen Description TRACHEAL ASPIRATE  Final   Special Requests Normal  Final   Gram Stain   Final    RARE WBC PRESENT, PREDOMINANTLY MONONUCLEAR RARE GRAM NEGATIVE COCCOBACILLI    Culture RARE CANDIDA ALBICANS  Final   Report Status 11/03/2016 FINAL  Final  Culture, blood (single) w Reflex to ID Panel     Status: None (Preliminary result)   Collection Time: 10/31/16  2:08 PM  Result Value Ref Range Status   Specimen Description BLOOD RIGHT ANTECUBITAL  Final   Special Requests IN PEDIATRIC BOTTLE 3CC  Final   Culture NO GROWTH 3 DAYS  Final   Report Status PENDING  Incomplete  Aerobic  Culture (superficial specimen)     Status: None (Preliminary result)   Collection Time: 11/03/16  8:46 AM  Result Value Ref Range Status   Specimen Description WOUND RIGHT THIGH  Final   Special Requests NONE  Final   Gram Stain   Final    RARE WBC PRESENT, PREDOMINANTLY PMN NO ORGANISMS SEEN    Culture PENDING  Incomplete   Report Status PENDING  Incomplete  C difficile quick scan w PCR reflex     Status: Abnormal   Collection Time: 11/03/16  6:21 PM  Result Value Ref Range Status   C Diff antigen POSITIVE (A) NEGATIVE Final   C Diff toxin POSITIVE (A) NEGATIVE Final   C Diff interpretation Toxin producing C. difficile detected.  Final    Comment: CRITICAL RESULT CALLED TO, READ BACK BY AND VERIFIED WITH: A.HARGETT,RN AT 2322 BY L.PITT 11/03/16     Studies/Results: Dg Chest Port 1 View  Result Date: 11/04/2016 CLINICAL DATA:  Intubation. EXAM: PORTABLE CHEST 1 VIEW COMPARISON:  11/03/2006. FINDINGS: Endotracheal tube and NG tube noted in good anatomic position. Prior CABG. Stable cardiomegaly. Developing left lower lobe pulmonary infiltrate. Low lung volumes with bibasilar atelectasis. Infiltrates/edema. IMPRESSION: 1. Lines and tubes including left chest tube in stable position. No pneumothorax. 2. Prior CABG.  Stable cardiomegaly. 3.  Developing left lower lobe infiltrate.  Low lung volumes. Electronically Signed   By: Marcello Moores  Register   On: 11/04/2016 08:26   Dg Chest Port 1 View  Result Date: 11/03/2016 CLINICAL DATA:  Respiratory failure ; status post CABG, aortic valve replacement, mitral valve repair, ascending thoracic aortic aneurysm repair ; sternal closure on October 26, 2016 EXAM: PORTABLE CHEST 1 VIEW COMPARISON:  Portable chest x-ray of November 02, 2016 FINDINGS: The lungs are adequately inflated. Bilateral chest tubes are present. There is no pneumothorax nor significant pleural effusion. The interstitial markings remain mildly prominent. The cardiac silhouette  remains enlarged. The pulmonary vascularity is prominent centrally. The endotracheal tube tip lies approximately 6.1 cm above the carina. The esophagogastric tube tip projects below the inferior margin of the image. The PICC line tip projects over the proximal SVC. There is calcification in the wall of the aortic arch. IMPRESSION: Stable appearance of the chest since yesterday's study. The support tubes are in stable position. Electronically Signed   By: David  Martinique M.D.   On: 11/03/2016 08:31     Assessment/Plan: Post cardiotomy shock             CABG, MVR, AVR, aneurysm repair . On lasix gtt. Off pressors again today.   Fever post cardiac surgery - likely multifactorial. Fevers started closely after the surgery, could be from clots vs ischemic digits, but now also has cdiff. Other Cultures negative. -cont PO vanc and po flagyl x14 days. Hold any other abx for now.  Monitor fever curve.  -Consider imaging for sternotomy infection and cdiff complications if he worsens.   Transaminitis-  Likely from shock, improving.   VDRF - no plan for extubation yet. PCCM on board.   Total days of antibiotics: day 2 of PO vanc. 7 days of IV vanc off 12/11. (Off merrem/anidulafungin)          [name], [name] Infectious Diseases (pager) [phone] [email or web address] 11/04/2016, 8:43 AM  LOS: 15 days

## 2016-11-04 NOTE — Progress Notes (Signed)
CT surgery p.m. Rounds Patient atrial flutter now on IV heparin 1500 units Patient being treated for recently diagnosed C. difficile colitis Temperature reached 100.8 this evening with stable blood pressure Tolerated pressure support weaning for approximately 5 hours today Patient has rising oxygenation requirement, breath sounds are coarse urine output has been good Check x-ray, ABG this p.m.

## 2016-11-04 NOTE — Progress Notes (Addendum)
[facility] DAILY ICU PROGRESS NOTE                   [address]            [place] [address]          [phone]   9 Days Post-Op Procedure(s) (LRB): STERNAL WASHOUT AND DELAYED PRIMARY CLOSURE (N/A) TRANSESOPHAGEAL ECHOCARDIOGRAM (TEE) (N/A)  Total Length of Stay:  LOS: 15 days   Subjective: cdiff is positive- on vanco Tolerating TF's currently  Objective: Vital signs in last 24 hours: Temp:  [95.4 F (35.2 C)-101.8 F (38.8 C)] 97.9 F (36.6 C) (12/13 0715) Pulse Rate:  [69-129] 86 (12/13 0715) Cardiac Rhythm: Atrial fibrillation (12/13 0700) Resp:  [10-32] 18 (12/13 0715) BP: (58-133)/(39-114) 108/81 (12/13 0715) SpO2:  [98 %-100 %] 100 % (12/13 0715) FiO2 (%):  [40 %] 40 % (12/13 0400) Weight:  [255 lb 4.7 oz (115.8 kg)-266 lb 12.1 oz (121 kg)] 255 lb 4.7 oz (115.8 kg) (12/13 0315)  Filed Weights   11/02/16 0500 11/03/16 1300 11/04/16 0315  Weight: 260 lb 5.8 oz (118.1 kg) 266 lb 12.1 oz (121 kg) 255 lb 4.7 oz (115.8 kg)   Vent Mode: PRVC FiO2 (%):  [40 %] 40 % Set Rate:  [12 bmp] 12 bmp Vt Set:  [570 mL] 570 mL PEEP:  [5 cmH20] 5 cmH20 Pressure Support:  [5 cmH20] 5 cmH20 Plateau Pressure:  [15 cmH20-25 cmH20] 15 cmH20 Weight change:    Hemodynamic parameters for last 24 hours: CVP:  [11 mmHg-13 mmHg] 12 mmHg  Intake/Output from previous day: 12/12 0701 - 12/13 0700 In: 4404.6 [I.V.:3049.6; NG/GT:1355] Out: 5610 [Urine:4585; Stool:475; Chest Tube:550]  Intake/Output this shift: No intake/output data recorded.  Current Meds: Scheduled Meds: . acyclovir  200 mg Oral QID  . aspirin EC  325 mg Oral Daily   Or  . aspirin  324 mg Per Tube Daily  . chlorhexidine gluconate (MEDLINE KIT)  15 mL Mouth Rinse BID  . feeding supplement (PRO-STAT SUGAR FREE 64)  30 mL Per Tube BID  . free water  200 mL Per Tube Q8H  . insulin aspart  0-24 Units Subcutaneous Q4H  . insulin glargine  15 Units Subcutaneous BID  . mouth rinse  15 mL Mouth Rinse QID   . pantoprazole sodium  40 mg Per Tube Q24H  . potassium chloride  40 mEq Per Tube Q12H  . potassium chloride (KCL MULTIRUN) 30 mEq in 265 mL IVPB  30 mEq Intravenous Once  . sodium chloride flush  3 mL Intravenous Q12H  . vancomycin  500 mg Per Tube Q6H   Continuous Infusions: . sodium chloride 20 mL/hr at 10/28/16 0400  . sodium chloride 30 mL/hr at 11/04/16 0000  . amiodarone 30 mg/hr (11/04/16 9518)  . bivalirudin (ANGIOMAX) infusion 0.5 mg/mL (Non-ACS indications) 0.08 mg/kg/hr (11/04/16 0600)  . dexmedetomidine 1.2 mcg/kg/hr (11/04/16 0600)  . feeding supplement (VITAL HIGH PROTEIN) 1,000 mL (11/04/16 0304)  . fentaNYL infusion INTRAVENOUS 200 mcg/hr (11/04/16 0600)  . furosemide (LASIX) infusion 10 mg/hr (11/04/16 0600)  . phenylephrine (NEO-SYNEPHRINE) Adult infusion Stopped (11/03/16 1800)   PRN Meds:.acetaminophen (TYLENOL) oral liquid 160 mg/5 mL, Gerhardt's butt cream, ipratropium-albuterol, metoprolol, midazolam, morphine injection, ondansetron (ZOFRAN) IV, sodium chloride flush  General appearance: delirious, no distress and sedated Heart: irregularly irregular rhythm Lungs: clear anteriorly Abdomen: soft, + BS, no guarding or rebound, mild distension Extremities: + edema, feet and hand changes relatively stable in appearance, leg  incis draining serosang Wound: as above  Lab Results: CBC: Recent Labs  11/03/16 0449 11/04/16 0400  WBC 18.1* 12.5*  HGB 10.1* 10.1*  HCT 33.8* 33.5*  PLT 244 279   BMET:  Recent Labs  11/03/16 0449 11/04/16 0400  NA 145 147*  K 4.1 3.7  CL 105 109  CO2 34* 31  GLUCOSE 120* 160*  BUN 35* 39*  CREATININE 0.76 0.78  CALCIUM 7.9* 8.1*    CMET: Lab Results  Component Value Date   WBC 12.5 (H) 11/04/2016   HGB 10.1 (L) 11/04/2016   HCT 33.5 (L) 11/04/2016   PLT 279 11/04/2016   GLUCOSE 160 (H) 11/04/2016   CHOL 134 10/21/2016   TRIG 203 (H) 10/21/2016   HDL 28 (L) 10/21/2016   LDLCALC 65 10/21/2016   ALT 84 (H)  11/03/2016   AST 46 (H) 11/03/2016   NA 147 (H) 11/04/2016   K 3.7 11/04/2016   CL 109 11/04/2016   CREATININE 0.78 11/04/2016   BUN 39 (H) 11/04/2016   CO2 31 11/04/2016   TSH 1.93 10/19/2016   INR 2.52 11/03/2016   HGBA1C 6.6 (H) 10/21/2016    PT/INR:  Recent Labs  11/03/16 0449  LABPROT 27.7*  INR 2.52   Radiology: No results found.   Assessment/Plan: S/P Procedure(s) (LRB): STERNAL WASHOUT AND DELAYED PRIMARY CLOSURE (N/A) TRANSESOPHAGEAL ECHOCARDIOGRAM (TEE) (N/A)  1 hemodyn stable in rate controlled afib, off neo and milrinone. Amio gtt at 30 ml /hr. CO-OX 57  2 on vanco for diff 3 improved leukocytosis 4 excellent UO on lasix gtt, BUN rising so may need to back off a little(  Diarrhea is contributing to fluid loss)  , creat normal, getting K+ supplementation 5 anemia is stable, no INR today - remains on angiomax- consider stop, with change to heparin.  DIC resolved, platelets normal 6 VDRF - wean per CCM 7 Limb ischemia- stable findings, monitor closely 8 sugars reasonably controlled 9 F/U LFT's in am    GOLD,WAYNE E 11/04/2016 7:22 AM   I have seen and examined the patient and agree with the assessment and plan as outlined.  Much more stable overnight.  Stool + for C diff on Vanc per tube.  Will stop bivalirudin and start heparin.  I suspect patient will need tracheostomy.  Rexene Alberts, MD 11/04/2016 9:00 AM

## 2016-11-04 NOTE — Progress Notes (Signed)
Pt placed back on full vent support d/t agitation and increased RR even w/ increasing PS.    Also, vent circuit changed out d/t s/p 14 days on vent.  Pt tol change out well, VSS.

## 2016-11-04 NOTE — Progress Notes (Signed)
PULMONARY / CRITICAL CARE MEDICINE   Name: Scott [name] MRN: [id number] DOB: 1946/06/27    ADMISSION DATE:  10/20/2016 CONSULTATION DATE: 12/1  REFERRING MD: Ricard Dillon  CHIEF COMPLAINT:  CAD  HISTORY OF PRESENT ILLNESS:   70 yo former smoker with known CAD and ascending aorta aneurysm, who was taken to OR 11/30 for cabg x 3, aortic root replacement, MVR replacement, thoracic cavity left open and had extended pump time in OR.  SUBJECTIVE:  Tolerating pressure support.  Remains on pressors.  Still requiring high dose sedation.  Still having fever. Not extubatable   VITAL SIGNS: BP 108/81   Pulse 86   Temp 97.9 F (36.6 C)   Resp 18   Ht 5\' 9"  (1.753 m)   Wt 255 lb 4.7 oz (115.8 kg)   SpO2 100%   BMI 37.70 kg/m   HEMODYNAMICS: CVP:  [11 mmHg-12 mmHg] 12 mmHg  VENTILATOR SETTINGS: Vent Mode: PRVC FiO2 (%):  [40 %] 40 % Set Rate:  [12 bmp] 12 bmp Vt Set:  [570 mL] 570 mL PEEP:  [5 cmH20] 5 cmH20 Pressure Support:  [5 cmH20] 5 cmH20 Plateau Pressure:  [15 cmH20-25 cmH20] 15 cmH20  INTAKE / OUTPUT: I/O last 3 completed shifts: In: 7079.8 [I.V.:4779.8; NG/GT:2300] Out: [id number] [Urine:7305; Stool:625; Chest Tube:900]  PHYSICAL EXAMINATION: General: sedated but arouses Neuro: RASS -1 HEENT:  ETT in place Cardiovascular: irregular, chest closed Lungs:  No wheeze Abdomen:  Soft, non tender Ext: 1+ edema, extremities cool Skin: Ischemic changes in fingers b/l   LABS:  BMET  Recent Labs Lab 11/02/16 0400 11/03/16 0449 11/04/16 0400  NA 143 145 147*  K 4.5 4.1 3.7  CL 107 105 109  CO2 30 34* 31  BUN 37* 35* 39*  CREATININE 0.69 0.76 0.78  GLUCOSE 179* 120* 160*    Electrolytes  Recent Labs Lab 11/01/16 0415 11/02/16 0400 11/03/16 0449 11/04/16 0400  CALCIUM 7.8* 8.1* 7.9* 8.1*  MG 2.2 2.2 1.9  --   PHOS 3.1 3.4  --   --     CBC  Recent Labs Lab 11/02/16 0400 11/03/16 0449 11/04/16 0400  WBC 11.7* 18.1* 12.5*  HGB 9.5* 10.1* 10.1*  HCT  31.2* 33.8* 33.5*  PLT 230 244 279    Coag's  Recent Labs Lab 11/01/16 0415 11/02/16 0400 11/03/16 0449 11/04/16 0400  APTT 65* 78* 75* 73*  INR 2.40 2.61 2.52  --     Sepsis Markers  Recent Labs Lab 10/29/16 0930 10/29/16 1628  LATICACIDVEN 2.0* 2.0*    ABG  Recent Labs Lab 10/28/16 2305 10/29/16 0417  PHART 7.514* 7.475*  PCO2ART 33.4 36.8  PO2ART 90.0 90.0    Liver Enzymes  Recent Labs Lab 11/01/16 0415 11/02/16 0400 11/03/16 0449  AST 73* 51* 46*  ALT 184* 114* 84*  ALKPHOS 119 115 129*  BILITOT 1.5* 1.5* 1.4*  ALBUMIN 2.1* 2.3* 2.1*    Cardiac Enzymes No results for input(s): TROPONINI, PROBNP in the last 168 hours.  Glucose  Recent Labs Lab 11/03/16 1136 11/03/16 1543 11/03/16 2040 11/04/16 0014 11/04/16 0359 11/04/16 0810  GLUCAP 107* 149* 183* 182* 152* 165*    Imaging Dg Chest Port 1 View  Result Date: 11/04/2016 CLINICAL DATA:  Intubation. EXAM: PORTABLE CHEST 1 VIEW COMPARISON:  11/03/2006. FINDINGS: Endotracheal tube and NG tube noted in good anatomic position. Prior CABG. Stable cardiomegaly. Developing left lower lobe pulmonary infiltrate. Low lung volumes with bibasilar atelectasis. Infiltrates/edema. IMPRESSION: 1. Lines and tubes including left  chest tube in stable position. No pneumothorax. 2. Prior CABG.  Stable cardiomegaly. 3.  Developing left lower lobe infiltrate.  Low lung volumes. Electronically Signed   By: Marcello Moores  Register   On: 11/04/2016 08:26    STUDIES:  12.5 dopplers lowers>>>neg 12/6 dopplers arms >>>neg 12/7 abdo us>>>Cholelithiasis without sonographic evidence for acute cholecystitis. No biliary dilatation.Increased hepatic echogenicity, suggestive of steatosis. Ascites. 12/7 echo>>>60-65%, rv poor images but appeared wnl  CULTURES: 12/1 bc >>>neg  12/7 sputum>>> normal flora  12/4 BC x 4>>>neg 12/7 BC>>>  12/12 c dif>>++ 12/12 bc>> 12/12 wound>>  ANTIBIOTICS: Per ID 11/30 levaquin>>off  12/3 12/3 ceftriaxone>>>12/7 12/3 vanc>>> off 12/7 meropenem>>>off 12/7 eraxis>>>off Acyclovir>>12/10>> Flagyl 12/13>> Vanco po 12/12>>  SIGNIFICANT EVENTS: 11/30 surgery, hypoxia 12/4 closed chest 12/7 high fevers, LFt up  LINES/TUBES: 11/30 OTT>> 11/30 rt I j PA cath>>12/4 12/4 picc left >>> 11/30 l rad aline>>12/7 11/30 CT x 4>> 11/30 wound vac>>12/4  ASSESSMENT / PLAN:  PULMONARY A: Acute hypoxic respiratory failure after CABG, MVR. P:   Pressure support wean - not stable for extubation trial Likely will need trach to assist with vent weaning F/u CXR  CARDIOVASCULAR A:  S/p CABG, MVR, ascending aortic root replacement. Hypotension. A fib. Rt atrial clot. B/l hand ischemia. P:  Wean pressors to keep MAP > 65 Angiomax Lasix, primacor gtt per cardiology  RENAL A:   Hypervolemia. P:   Lasix gtt to restart 12/11  GASTROINTESTINAL A:   Nutrition. P:   Tube feeds while on vent Protonix for SUP  HEMATOLOGIC A:   DIC - resolved. Anemia of critical illness. P:  F/u CBC   INFECTIOUS A:   Fever  + cdif P:   Abx per ID  ENDOCRINE CBG (last 3)   Recent Labs  11/04/16 0014 11/04/16 0359 11/04/16 0810  GLUCAP 182* 152* 165*     A: DM. P:   SSI with lantus  NEUROLOGIC A:   Acute metabolic encephalopathy. sedated P:   RASS goal 0  CC time 45 minutes.  Scott [name] ACNP Scott [name] PCCM Pager [phone] till 3 pm If no answer page [phone] 11/04/2016, 9:01 AM  C diff positive.   Tolerating pressure support.  Anasarca.  Opens eyes.  B/l crackles.  Abd soft.  HR regular.  WBC 12.5, Hb 10.1, Creatinine 0.78  CXR - LLL ASD  Assessment/plan:  Fever, C diff. - flagyl, enteral vancomycin  Acute respiratory failure. - overall condition likely prohibitive for extubation trial at this time - likely would need trach to assist with vent wean >> timing per TCTS in setting of sternal wound  Hypervolemia. - lasix per TCTS  Updated  pt's daughter on 12/12.  CC time by me independent of APP time 36 minutes.  Chesley Mires, MD [facility] Pulmonary/Critical Care 11/04/2016, 11:01 AM Pager:  [phone] After 3pm call: [phone]

## 2016-11-04 NOTE — Progress Notes (Signed)
ANTICOAGULATION CONSULT NOTE - Follow Up Consult  Pharmacy Consult for Bivalirudin>>heparin Indication: possible clot in RA and afib   Allergies  Allergen Reactions  . Doxycycline Hives  . Penicillins Hives     Has patient had a PCN reaction causing immediate rash, facial/tongue/throat swelling, SOB or lightheadedness with hypotension: No Has patient had a PCN reaction causing severe rash involving mucus membranes or skin necrosis: No Has patient had a PCN reaction that required hospitalization: No Has patient had a PCN reaction occurring within the last 10 years: Yes If all of the above answers are "NO", then may proceed with Cephalosporin use.   . Sulfa Antibiotics Hives    Patient Measurements: Height: 5\' 9"  (175.3 cm) Weight: 255 lb 4.7 oz (115.8 kg) IBW/kg (Calculated) : 70.7  Vital Signs: Temp: 97 F (36.1 C) (12/13 0900) BP: 104/71 (12/13 0900) Pulse Rate: 82 (12/13 0900)  Labs:  Recent Labs  11/02/16 0400 11/03/16 0449 11/04/16 0400  HGB 9.5* 10.1* 10.1*  HCT 31.2* 33.8* 33.5*  PLT 230 244 279  APTT 78* 75* 73*  LABPROT 28.4* 27.7*  --   INR 2.61 2.52  --   CREATININE 0.69 0.76 0.78    Estimated Creatinine Clearance: 107.8 mL/min (by C-G formula based on SCr of 0.78 mg/dL).   Medications:  Bivalirudin @ 0.08mg /kg/hr  Assessment: 70yom continues on bivalirudin for possible right atrial clot and afib. APTT is therapeutic at 73 seconds. HIT ruled out (low heparin antibody and SRA negative).   INR stable 2.5 yesterday   New orders to transition to IV heparin since HIT panel was negative.   Goal of Therapy:  Heparin level goal 0.3-0.7 aPTT 50-85 seconds Monitor platelets by anticoagulation protocol: Yes   Plan:  1) stop bival this am 2) Start heparin at 1500 unit/hr 3) Follow up oral anticoagulation 4) Will check INR tonight with heparin level to evaluate off bival  Erin Hearing PharmD., BCPS Clinical Pharmacist Pager [phone] 11/04/2016  12:18 PM

## 2016-11-04 NOTE — Progress Notes (Signed)
Pt spo2 decreased to 80% with good wave form. Fio2 increased to 100%. RN at bedside. RT will continue to monitor.

## 2016-11-04 NOTE — Progress Notes (Signed)
ANTICOAGULATION CONSULT NOTE - Follow Up Consult  Pharmacy Consult for Bivalirudin>>heparin Indication: possible clot in RA and afib   Allergies  Allergen Reactions  . Doxycycline Hives  . Penicillins Hives     Has patient had a PCN reaction causing immediate rash, facial/tongue/throat swelling, SOB or lightheadedness with hypotension: No Has patient had a PCN reaction causing severe rash involving mucus membranes or skin necrosis: No Has patient had a PCN reaction that required hospitalization: No Has patient had a PCN reaction occurring within the last 10 years: Yes If all of the above answers are "NO", then may proceed with Cephalosporin use.   . Sulfa Antibiotics Hives    Patient Measurements: Height: 5\' 9"  (175.3 cm) Weight: 255 lb 4.7 oz (115.8 kg) IBW/kg (Calculated) : 70.7  Vital Signs: Temp: 100.9 F (38.3 C) (12/13 1938) Temp Source: Core (Comment) (12/13 1200) BP: 130/93 (12/13 1938) Pulse Rate: 117 (12/13 1938)  Labs:  Recent Labs  11/02/16 0400 11/03/16 0449 11/04/16 0400 11/04/16 1955  HGB 9.5* 10.1* 10.1*  --   HCT 31.2* 33.8* 33.5*  --   PLT 230 244 279  --   APTT 78* 75* 73*  --   LABPROT 28.4* 27.7*  --  21.8*  INR 2.61 2.52  --  1.88  HEPARINUNFRC  --   --   --  <0.10*  CREATININE 0.69 0.76 0.78  --     Estimated Creatinine Clearance: 107.8 mL/min (by C-G formula based on SCr of 0.78 mg/dL).   Medications:  Bivalirudin @ 0.08mg /kg/hr  Assessment: 70yom continues on bivalirudin for possible right atrial clot and afib. APTT is therapeutic at 73 seconds. HIT ruled out (low heparin antibody and SRA negative).   INR stable 2.5 yesterday, INR = 1.88 this PM  New orders to transition to IV heparin since HIT panel was negative.   Heparin level this evening < 0.10  Goal of Therapy:  Heparin level goal 0.3-0.7 aPTT 50-85 seconds Monitor platelets by anticoagulation protocol: Yes   Plan:  Increase heparin to 1750 units / hr Follow up AM  heparin level, CBC  Thank you Anette Guarneri, PharmD [phone]  11/04/2016 9:21 PM

## 2016-11-04 NOTE — Progress Notes (Signed)
Patient ID: Scott [name], male   DOB: 29-May-1946, 70 y.o.   MRN: [id number]     Advanced Heart Failure Rounding Note  Referring Physician: Dr Roxy Manns Primary Physician: Dr Quintin Alto Primary Cardiologist:  Dr Percival Spanish   Reason for Consultation: Heart Failure   Subjective:    Admitted with chest pain. RHC//LHC with 3 vessel disease. Taken to the OR 11/30 for cabg x 3, repair of thoracic ascending aneurysm, AVR bioprosthetic,  MVR, and open chest with VAC placement.    Chest successfully closed 10/26/16.    Coox 57% this am, off milrinone and phenylephrine now.      CVP 14. Currently on lasix 10 mg/hr, good UOP, mildly negative.  Remains intubated and sedated. Occasionally awakes and responds to command.   HR in 100s on amiodarone gtt.  Suspect current rhythm is atrial flutter.   Suspect DIC and not HIT.  He remains on bivalirudin with RA clot and atrial fibrillation. HIT Ab and SRA negative.  Fever of unclear source. Tm back up 102. ID following.  May be from gangrenous digits. Now off IV vancomycin, anidulafungin and meropenem. C difficile came back positive today.  This likely plays a role but fever pre-dates.  He is now on po vancomycin and Flagyl.   TEE 10/26/16 EF 40-45%, small cavity size, flattened/hypokinetic septum, s/p MV repair with mild MR, mild to moderately decreased RV systolic function. + RA thrombus.   Limited echo 10/29/16 LVEF 60-65%, unable to comment on AVR structure or function with poor windows.   Upper and lower extremity doppler studies: No DVTs  ABIs (12/8): Unable to doppler PTs, or get TBIs.   Objective:   Weight Range: 255 lb 4.7 oz (115.8 kg) Body mass index is 37.7 kg/m.   Vital Signs:   Temp:  [95.4 F (35.2 C)-101.1 F (38.4 C)] 98.2 F (36.8 C) (12/13 1300) Pulse Rate:  [69-124] 108 (12/13 1300) Resp:  [10-32] 26 (12/13 1300) BP: (58-133)/(39-114) 118/85 (12/13 1300) SpO2:  [98 %-100 %] 98 % (12/13 1300) FiO2 (%):  [40 %] 40 % (12/13  1237) Weight:  [255 lb 4.7 oz (115.8 kg)] 255 lb 4.7 oz (115.8 kg) (12/13 0315) Last BM Date: 11/04/16  Weight change: Filed Weights   11/02/16 0500 11/03/16 1300 11/04/16 0315  Weight: 260 lb 5.8 oz (118.1 kg) 266 lb 12.1 oz (121 kg) 255 lb 4.7 oz (115.8 kg)    Intake/Output:   Intake/Output Summary (Last 24 hours) at 11/04/16 1359 Last data filed at 11/04/16 1300  Gross per 24 hour  Intake          4629.86 ml  Output             5745 ml  Net         -1115.14 ml     Physical Exam: CVP 14 General: Remains intubated and sedated.  HEENT: + ETT.  Neck: supple. JVP 12. Carotids 2+ bilat; no bruits. No thyromegaly or nodule noted.   Cor: PMI nondisplaced. Mildly tachy, regular.  No M/G/R noted. Chest tubes in place.  Lungs: Scattered rhonchi, Mechanical breathing sounds present.   Abdomen: soft, ND, no HSM. No bruits or masses. +BS  Extremities: no clubbing, rash. 1+ ankle edema. Digital gangrene. Toes remain mottled, some improvement. Unable to doppler pedal pulses.  Neuro: Intubated and sedated.  GU: Foley in place  Telemetry: Reviewed, Currently aflutter 100s  Labs: CBC  Recent Labs  11/03/16 0449 11/04/16 0400  WBC 18.1* 12.5*  HGB 10.1*  10.1*  HCT 33.8* 33.5*  MCV 97.1 94.6  PLT 244 177   Basic Metabolic Panel  Recent Labs  11/02/16 0400 11/03/16 0449 11/04/16 0400  NA 143 145 147*  K 4.5 4.1 3.7  CL 107 105 109  CO2 30 34* 31  GLUCOSE 179* 120* 160*  BUN 37* 35* 39*  CREATININE 0.69 0.76 0.78  CALCIUM 8.1* 7.9* 8.1*  MG 2.2 1.9  --   PHOS 3.4  --   --    Liver Function Tests  Recent Labs  11/02/16 0400 11/03/16 0449  AST 51* 46*  ALT 114* 84*  ALKPHOS 115 129*  BILITOT 1.5* 1.4*  PROT 5.0* 4.9*  ALBUMIN 2.3* 2.1*   No results for input(s): LIPASE, AMYLASE in the last 72 hours. Cardiac Enzymes No results for input(s): CKTOTAL, CKMB, CKMBINDEX, TROPONINI in the last 72 hours.  BNP: BNP (last 3 results) No results for input(s): BNP in  the last 8760 hours.  ProBNP (last 3 results) No results for input(s): PROBNP in the last 8760 hours.   D-Dimer No results for input(s): DDIMER in the last 72 hours. Hemoglobin A1C No results for input(s): HGBA1C in the last 72 hours. Fasting Lipid Panel No results for input(s): CHOL, HDL, LDLCALC, TRIG, CHOLHDL, LDLDIRECT in the last 72 hours. Thyroid Function Tests No results for input(s): TSH, T4TOTAL, T3FREE, THYROIDAB in the last 72 hours.  Invalid input(s): FREET3  Other results:  Imaging/Studies:  Dg Chest Port 1 View  Result Date: 11/04/2016 CLINICAL DATA:  Intubation. EXAM: PORTABLE CHEST 1 VIEW COMPARISON:  11/03/2006. FINDINGS: Endotracheal tube and NG tube noted in good anatomic position. Prior CABG. Stable cardiomegaly. Developing left lower lobe pulmonary infiltrate. Low lung volumes with bibasilar atelectasis. Infiltrates/edema. IMPRESSION: 1. Lines and tubes including left chest tube in stable position. No pneumothorax. 2. Prior CABG.  Stable cardiomegaly. 3.  Developing left lower lobe infiltrate.  Low lung volumes. Electronically Signed   By: Marcello Moores  Register   On: 11/04/2016 08:26   Dg Chest Port 1 View  Result Date: 11/03/2016 CLINICAL DATA:  Respiratory failure ; status post CABG, aortic valve replacement, mitral valve repair, ascending thoracic aortic aneurysm repair ; sternal closure on October 26, 2016 EXAM: PORTABLE CHEST 1 VIEW COMPARISON:  Portable chest x-ray of November 02, 2016 FINDINGS: The lungs are adequately inflated. Bilateral chest tubes are present. There is no pneumothorax nor significant pleural effusion. The interstitial markings remain mildly prominent. The cardiac silhouette remains enlarged. The pulmonary vascularity is prominent centrally. The endotracheal tube tip lies approximately 6.1 cm above the carina. The esophagogastric tube tip projects below the inferior margin of the image. The PICC line tip projects over the proximal SVC. There is  calcification in the wall of the aortic arch. IMPRESSION: Stable appearance of the chest since yesterday's study. The support tubes are in stable position. Electronically Signed   By: David  Martinique M.D.   On: 11/03/2016 08:31    Medications:     Scheduled Medications: . acyclovir  200 mg Oral QID  . aspirin EC  325 mg Oral Daily   Or  . aspirin  324 mg Per Tube Daily  . chlorhexidine gluconate (MEDLINE KIT)  15 mL Mouth Rinse BID  . feeding supplement (PRO-STAT SUGAR FREE 64)  30 mL Per Tube BID  . free water  200 mL Per Tube Q8H  . insulin aspart  0-24 Units Subcutaneous Q4H  . insulin glargine  15 Units Subcutaneous BID  . mouth rinse  15 mL Mouth Rinse QID  . metroNIDAZOLE  500 mg Per Tube TID  . pantoprazole sodium  40 mg Per Tube Q24H  . potassium chloride  40 mEq Per Tube Q12H  . sodium chloride flush  3 mL Intravenous Q12H  . vancomycin  500 mg Per Tube Q6H    Infusions: . sodium chloride 20 mL/hr at 10/28/16 0400  . sodium chloride 30 mL/hr at 11/04/16 1300  . amiodarone 30 mg/hr (11/04/16 1300)  . dexmedetomidine 1 mcg/kg/hr (11/04/16 1300)  . feeding supplement (VITAL HIGH PROTEIN) 1,000 mL (11/04/16 1300)  . fentaNYL infusion INTRAVENOUS 150 mcg/hr (11/04/16 1355)  . furosemide (LASIX) infusion 10 mg/hr (11/04/16 1300)  . heparin 1,500 Units/hr (11/04/16 1300)  . phenylephrine (NEO-SYNEPHRINE) Adult infusion Stopped (11/03/16 1800)    PRN Medications: acetaminophen (TYLENOL) oral liquid 160 mg/5 mL, Gerhardt's butt cream, ipratropium-albuterol, metoprolol, midazolam, morphine injection, ondansetron (ZOFRAN) IV, sodium chloride flush   Assessment/Plan   Mr Mcclimans is 70 year old admitted with CP. Complex operation this admission.  CABG x 3 + bioprosthetic AVR for severe bicuspid AS + ascending aorta replacement + MV repair for flail P2.  MV repair complicated by severe SAM, ended up having to remove posterior annuloplasty band.  Chest closed on 12/3.   1. CAD:   10/22/16 S/P CABG x3 with chest left open.  Chest successfully closed am of 10/26/16. - Continue ASA, start statin eventually now that LFTs down.  2. Severe Aortic Stenosis: 11/302017 S/P AVR bioprostheic  3. Mitral regurgitation: Partial flail leaflet with MV repair.  Developed SAM initially after repair and posterior annuloplasty ring had to be removed.  SAM resolved. 4. Acute on chronic systolic CHF:  TEE at time of chest closure showed EF 40-45%, small cavity size, flattened/hypokinetic septum, s/p MV repair with mild MR, mild to moderately decreased RV systolic function, thrombus in RA. Repeat echo 12/7 with EF 60-65%.  Co-ox 57% with CVP 14. He is now off milrinone and phenylephrine.  - Continue lasix gtt 10 mg/hr, aim to keep him gently negative, also has losses from diarrhea with C difficile.  5. ID: ?Source of fever => ID following, do not think endocarditis.  Concerned digital gangrene is a contributor.  Now off meropenem, IV vancomycin, and anidulafungin. C difficile has been identified and is playing a role.  6. Elevated LFTs: Most likely shock liver.  LFTs now trended down.   - Agree with amiodarone 30 mg/hr.  Transaminitis likely due to shock liver.  - No statin until LFTs come down.  - Abdominal US 10/29/16 with Cholelithiasis without sonographic evidence for acute cholecystitis. No biliary dilatation. Increased hepatic echogenicity, suggestive of steatosis. and Ascites.   7. Heme: HIT negative, suspect DIC. Platelet count has recovered.  - On bivalirudin for RA thrombus and atrial fibrillation => transition to heparin gtt.  - Will need transition to warfarin eventually.  8. VDRF: CCM following for vent management. Pulmonary edema present, ?PNA. He will likely need tracheostomy.  9. Atrial fibrillation/flutter: Appears to be in flutter today.  Continue amiodarone gtt for rate control, transition to heparin gtt.  Eventually will go on warfarin.  10. PAD: Finger gangrene, mottled toes.   Toes may be looking a bit better.  Unable to doppler PT pulses or get TBIs.  Continues on bivalirudin. .  - May be source of fever  Loralie Champagne, MD 11/04/2016 1:59 PM  Advanced Heart Failure Team Pager [phone] (M-F; 7a - 4p)  Please contact [facility] for  night-coverage after hours (4p -7a ) and weekends on [email or web address]

## 2016-11-05 ENCOUNTER — Inpatient Hospital Stay (HOSPITAL_COMMUNITY): Payer: Medicare Other

## 2016-11-05 DIAGNOSIS — E872 Acidosis: Secondary | ICD-10-CM

## 2016-11-05 DIAGNOSIS — D65 Disseminated intravascular coagulation [defibrination syndrome]: Secondary | ICD-10-CM

## 2016-11-05 DIAGNOSIS — I70261 Atherosclerosis of native arteries of extremities with gangrene, right leg: Secondary | ICD-10-CM

## 2016-11-05 DIAGNOSIS — I4891 Unspecified atrial fibrillation: Secondary | ICD-10-CM

## 2016-11-05 DIAGNOSIS — I5033 Acute on chronic diastolic (congestive) heart failure: Secondary | ICD-10-CM

## 2016-11-05 DIAGNOSIS — I70262 Atherosclerosis of native arteries of extremities with gangrene, left leg: Secondary | ICD-10-CM

## 2016-11-05 LAB — HEPATIC FUNCTION PANEL
ALT: 55 U/L (ref 17–63)
AST: 46 U/L — AB (ref 15–41)
Albumin: 1.9 g/dL — ABNORMAL LOW (ref 3.5–5.0)
Alkaline Phosphatase: 111 U/L (ref 38–126)
BILIRUBIN DIRECT: 0.4 mg/dL (ref 0.1–0.5)
Indirect Bilirubin: 0.6 mg/dL (ref 0.3–0.9)
TOTAL PROTEIN: 5 g/dL — AB (ref 6.5–8.1)
Total Bilirubin: 1 mg/dL (ref 0.3–1.2)

## 2016-11-05 LAB — CBC
HEMATOCRIT: 32.5 % — AB (ref 39.0–52.0)
HEMOGLOBIN: 9.8 g/dL — AB (ref 13.0–17.0)
MCH: 28.7 pg (ref 26.0–34.0)
MCHC: 30.2 g/dL (ref 30.0–36.0)
MCV: 95.3 fL (ref 78.0–100.0)
Platelets: 321 10*3/uL (ref 150–400)
RBC: 3.41 MIL/uL — ABNORMAL LOW (ref 4.22–5.81)
RDW: 17.9 % — ABNORMAL HIGH (ref 11.5–15.5)
WBC: 9.9 10*3/uL (ref 4.0–10.5)

## 2016-11-05 LAB — GLUCOSE, CAPILLARY
GLUCOSE-CAPILLARY: 154 mg/dL — AB (ref 65–99)
Glucose-Capillary: 126 mg/dL — ABNORMAL HIGH (ref 65–99)
Glucose-Capillary: 145 mg/dL — ABNORMAL HIGH (ref 65–99)
Glucose-Capillary: 153 mg/dL — ABNORMAL HIGH (ref 65–99)
Glucose-Capillary: 153 mg/dL — ABNORMAL HIGH (ref 65–99)
Glucose-Capillary: 161 mg/dL — ABNORMAL HIGH (ref 65–99)
Glucose-Capillary: 162 mg/dL — ABNORMAL HIGH (ref 65–99)

## 2016-11-05 LAB — HEPARIN LEVEL (UNFRACTIONATED)
HEPARIN UNFRACTIONATED: 0.24 [IU]/mL — AB (ref 0.30–0.70)
Heparin Unfractionated: 0.21 IU/mL — ABNORMAL LOW (ref 0.30–0.70)

## 2016-11-05 LAB — COOXEMETRY PANEL
Carboxyhemoglobin: 0.9 % (ref 0.5–1.5)
METHEMOGLOBIN: 1 % (ref 0.0–1.5)
O2 Saturation: 65.4 %
Total hemoglobin: 9 g/dL — ABNORMAL LOW (ref 12.0–16.0)

## 2016-11-05 LAB — BASIC METABOLIC PANEL
ANION GAP: 7 (ref 5–15)
BUN: 44 mg/dL — ABNORMAL HIGH (ref 6–20)
CHLORIDE: 107 mmol/L (ref 101–111)
CO2: 33 mmol/L — AB (ref 22–32)
Calcium: 7.8 mg/dL — ABNORMAL LOW (ref 8.9–10.3)
Creatinine, Ser: 1 mg/dL (ref 0.61–1.24)
GFR calc Af Amer: >60 mL/min (ref >60)
GFR calc non Af Amer: >60 mL/min (ref >60)
GLUCOSE: 150 mg/dL — AB (ref 65–99)
POTASSIUM: 3.5 mmol/L (ref 3.5–5.1)
Sodium: 147 mmol/L — ABNORMAL HIGH (ref 135–145)

## 2016-11-05 LAB — CULTURE, BLOOD (SINGLE): Culture: NO GROWTH

## 2016-11-05 MED ORDER — HEPARIN (PORCINE) IN NACL 100-0.45 UNIT/ML-% IJ SOLN
2100.0000 [IU]/h | INTRAMUSCULAR | Status: DC
  Administered 2016-11-06: 2100 [IU]/h via INTRAVENOUS
  Filled 2016-11-05: qty 250

## 2016-11-05 MED ORDER — FREE WATER
200.0000 mL | Freq: Four times a day (QID) | Status: DC
  Administered 2016-11-05 – 2016-11-06 (×4): 200 mL

## 2016-11-05 MED ORDER — AMIODARONE PEDIATRIC ORAL SUSPENSION 5 MG/ML: 400.0000 mg | Freq: Three times a day (TID) | ORAL | Status: DC

## 2016-11-05 MED ORDER — AMIODARONE HCL 200 MG PO TABS
400.0000 mg | ORAL_TABLET | Freq: Two times a day (BID) | ORAL | Status: DC
  Administered 2016-11-05 – 2016-11-11 (×13): 400 mg
  Filled 2016-11-05 (×13): qty 2

## 2016-11-05 MED ORDER — AMIODARONE HCL 200 MG PO TABS
400.0000 mg | ORAL_TABLET | Freq: Three times a day (TID) | ORAL | Status: DC
  Administered 2016-11-05: 400 mg
  Filled 2016-11-05: qty 2

## 2016-11-05 MED ORDER — POTASSIUM CHLORIDE 2 MEQ/ML IV SOLN
30.0000 meq | Freq: Once | INTRAVENOUS | Status: AC
  Administered 2016-11-05: 30 meq via INTRAVENOUS
  Filled 2016-11-05: qty 15

## 2016-11-05 NOTE — Progress Notes (Signed)
INFECTIOUS DISEASE PROGRESS NOTE  ID: Scott [name] is a 70 y.o. male with  Principal Problem:   S/P aortic valve replacement + mitral valve repair + CABG x3 + repair of ascending thoracic aortic aneurysm Active Problems:   Severe aortic stenosis by prior echocardiography   Unstable angina (HCC)   Severe aortic stenosis   Coronary artery disease involving native coronary artery of native heart with unstable angina pectoris (HCC)   Bicuspid aortic valve   Thoracic ascending aortic aneurysm (HCC)   Mitral regurgitation due to cusp prolapse   S/P ascending aortic aneurysm repair   S/P mitral valve repair   Hx of CABG   Cardiogenic shock (HCC)   Postoperative atrial fibrillation (HCC)   Thrombocytopenia (HCC)   Acute combined systolic and diastolic congestive heart failure (HCC)   Acute respiratory failure (HCC)   DIC (disseminated intravascular coagulation) (HCC)   Elevated LFTs   Acute encephalopathy   Fever   Enteritis due to Clostridium difficile   Anasarca   Atherosclerosis of native arteries of extremities with gangrene, left leg (HCC)   Atherosclerosis of native arteries of extremities with gangrene, right leg (HCC)  Subjective: Continues to have diarrhea.  Continues to have fevers Tmax 101.8 throughout the night, BP remains soft.   Abtx:  Anti-infectives    Start     Dose/Rate Route Frequency Ordered Stop   11/04/16 0845  metroNIDAZOLE (FLAGYL) 50 mg/ml oral suspension 500 mg     500 mg Per Tube 3 times daily 11/04/16 0831     11/03/16 2000  vancomycin (VANCOCIN) 50 mg/mL oral solution 500 mg     500 mg Per Tube Every 6 hours 11/03/16 1812 11/17/16 1759   11/01/16 1400  acyclovir (ZOVIRAX) 200 MG/5ML suspension SUSP 200 mg     200 mg Oral 4 times daily 11/01/16 1200     10/30/16 0927  anidulafungin (ERAXIS) 100 mg in sodium chloride 0.9 % 100 mL IVPB  Status:  Discontinued     100 mg over 90 Minutes Intravenous Every 24 hours 10/29/16 0927 10/31/16 1430   10/29/16 1000  meropenem (MERREM) 1 g in sodium chloride 0.9 % 100 mL IVPB  Status:  Discontinued     1 g 200 mL/hr over 30 Minutes Intravenous Every 8 hours 10/29/16 0931 10/31/16 1430   10/29/16 0930  anidulafungin (ERAXIS) 200 mg in sodium chloride 0.9 % 200 mL IVPB     200 mg over 180 Minutes Intravenous  Once 10/29/16 0927 10/29/16 1349   10/29/16 0600  vancomycin (VANCOCIN) 1,250 mg in sodium chloride 0.9 % 250 mL IVPB  Status:  Discontinued     1,250 mg 166.7 mL/hr over 90 Minutes Intravenous Every 24 hours 10/28/16 0900 11/02/16 0945   10/28/16 0600  vancomycin (VANCOCIN) IVPB 750 mg/150 ml premix  Status:  Discontinued     750 mg 150 mL/hr over 60 Minutes Intravenous Every 12 hours 10/27/16 1226 10/28/16 0900   10/27/16 1400  cefTRIAXone (ROCEPHIN) 2 g in dextrose 5 % 50 mL IVPB  Status:  Discontinued     2 g 100 mL/hr over 30 Minutes Intravenous Every 24 hours 10/27/16 1203 10/29/16 0907   10/27/16 1400  rifampin (RIFADIN) 300 mg in sodium chloride 0.9 % 100 mL IVPB  Status:  Discontinued     300 mg 200 mL/hr over 30 Minutes Intravenous Every 8 hours 10/27/16 1226 10/27/16 1426   10/27/16 1330  vancomycin (VANCOCIN) 2,000 mg in sodium chloride 0.9 % 500  mL IVPB     2,000 mg 250 mL/hr over 120 Minutes Intravenous  Once 10/27/16 1226 10/27/16 1451   10/27/16 1000  levofloxacin (LEVAQUIN) IVPB 750 mg  Status:  Discontinued     750 mg 100 mL/hr over 90 Minutes Intravenous Every 24 hours 10/26/16 1054 10/27/16 1203   10/26/16 1815  vancomycin (VANCOCIN) IVPB 1000 mg/200 mL premix     1,000 mg 200 mL/hr over 60 Minutes Intravenous  Once 10/26/16 1054 10/26/16 1752   10/26/16 0954  polymyxin B 500,000 Units, bacitracin 50,000 Units in sodium chloride irrigation 0.9 % 500 mL irrigation  Status:  Discontinued       As needed 10/26/16 0954 10/26/16 1030   10/26/16 0400  vancomycin (VANCOCIN) 1,500 mg in sodium chloride 0.9 % 250 mL IVPB     1,500 mg 125 mL/hr over 120 Minutes  Intravenous To Surgery 10/25/16 1145 10/26/16 0925   10/26/16 0400  vancomycin (VANCOCIN) 1,000 mg in sodium chloride 0.9 % 1,000 mL irrigation      Irrigation To Surgery 10/25/16 1145 10/26/16 0953   10/26/16 0400  levofloxacin (LEVAQUIN) IVPB 500 mg     500 mg 100 mL/hr over 60 Minutes Intravenous To Surgery 10/25/16 1145 10/26/16 0925   10/23/16 1000  levofloxacin (LEVAQUIN) IVPB 750 mg     750 mg 100 mL/hr over 90 Minutes Intravenous Every 24 hours 10/22/16 2130 10/23/16 1047   10/23/16 0515  vancomycin (VANCOCIN) IVPB 1000 mg/200 mL premix     1,000 mg 200 mL/hr over 60 Minutes Intravenous  Once 10/22/16 2130 10/23/16 0600   10/22/16 2045  vancomycin (VANCOCIN) 1,250 mg in sodium chloride 0.9 % 250 mL IVPB  Status:  Discontinued     1,250 mg 166.7 mL/hr over 90 Minutes Intravenous To Surgery 10/22/16 2043 10/22/16 2130   10/22/16 2045  levofloxacin (LEVAQUIN) IVPB 500 mg  Status:  Discontinued     500 mg 100 mL/hr over 60 Minutes Intravenous To Surgery 10/22/16 2043 10/22/16 2130   10/22/16 0400  vancomycin (VANCOCIN) 1,250 mg in sodium chloride 0.9 % 250 mL IVPB     1,250 mg 166.7 mL/hr over 90 Minutes Intravenous To Surgery 10/21/16 1305 10/22/16 2247   10/22/16 0400  vancomycin (VANCOCIN) 1,000 mg in sodium chloride 0.9 % 1,000 mL irrigation      Irrigation To Surgery 10/21/16 1305 10/22/16 0819   10/22/16 0400  levofloxacin (LEVAQUIN) IVPB 500 mg     500 mg 100 mL/hr over 60 Minutes Intravenous To Surgery 10/21/16 1305 10/22/16 2114      Medications:  Scheduled: . acyclovir  200 mg Oral QID  . amiodarone  400 mg Per Tube Q8H  . aspirin EC  325 mg Oral Daily   Or  . aspirin  324 mg Per Tube Daily  . chlorhexidine gluconate (MEDLINE KIT)  15 mL Mouth Rinse BID  . feeding supplement (PRO-STAT SUGAR FREE 64)  30 mL Per Tube BID  . free water  200 mL Per Tube Q6H  . insulin aspart  0-24 Units Subcutaneous Q4H  . insulin glargine  15 Units Subcutaneous BID  . mouth rinse   15 mL Mouth Rinse QID  . metroNIDAZOLE  500 mg Per Tube TID  . pantoprazole sodium  40 mg Per Tube Q24H  . potassium chloride  40 mEq Per Tube Q12H  . potassium chloride (KCL MULTIRUN) 30 mEq in 265 mL IVPB  30 mEq Intravenous Once  . sodium chloride flush  3 mL Intravenous  Q12H  . vancomycin  500 mg Per Tube Q6H    Objective: Vital signs in last 24 hours: Temp:  [97 F (36.1 C)-102 F (38.9 C)] 101.8 F (38.8 C) (12/14 0700) Pulse Rate:  [82-128] 92 (12/14 0700) Resp:  [14-31] 18 (12/14 0700) BP: (76-161)/(53-108) 97/79 (12/14 0700) SpO2:  [97 %-100 %] 100 % (12/14 0700) FiO2 (%):  [40 %-100 %] 50 % (12/14 0600) Weight:  [255 lb 15.3 oz (116.1 kg)] 255 lb 15.3 oz (116.1 kg) (12/14 0500)  Vitals reviewed. General: resting in bed, intubated, wakes up to answer to simple questions.  HEENT: PERRL, EOMI, no scleral icterus Cardiac: irregular, systolic murmur present  Pulm: coarse breath sounds to anterior field. No crackles.  Abd: soft, mild distension, no tenderness to touch. Chest: sternotomy site looks C/D/I. Has chest tubes. Ext: cold exts, dry gangrenous changes on distal portions of several fingers on both hands. Feet cold to touch. Has some bullous on the shins.  Neuro: wakes up briefly, on sedatives. Moves exts equally when asked.   Lab Results  Recent Labs  11/04/16 0400 11/05/16 0500  WBC 12.5* 9.9  HGB 10.1* 9.8*  HCT 33.5* 32.5*  NA 147* 147*  K 3.7 3.5  CL 109 107  CO2 31 33*  BUN 39* 44*  CREATININE 0.78 1.00   Liver Panel  Recent Labs  11/03/16 0449 11/05/16 0500  PROT 4.9* 5.0*  ALBUMIN 2.1* 1.9*  AST 46* 46*  ALT 84* 55  ALKPHOS 129* 111  BILITOT 1.4* 1.0  BILIDIR  --  0.4  IBILI  --  0.6   Sedimentation Rate No results for input(s): ESRSEDRATE in the last 72 hours. C-Reactive Protein No results for input(s): CRP in the last 72 hours.  Microbiology: Recent Results (from the past 240 hour(s))  Culture, blood (routine x 2)     Status:  None   Collection Time: 10/26/16 12:45 PM  Result Value Ref Range Status   Specimen Description BLOOD RIGHT ANTECUBITAL  Final   Special Requests IN PEDIATRIC BOTTLE 1.5CC  Final   Culture NO GROWTH 5 DAYS  Final   Report Status 10/31/2016 FINAL  Final  Culture, blood (routine x 2)     Status: None   Collection Time: 10/26/16 12:50 PM  Result Value Ref Range Status   Specimen Description BLOOD RIGHT HAND  Final   Special Requests IN PEDIATRIC BOTTLE 3CC  Final   Culture NO GROWTH 5 DAYS  Final   Report Status 10/31/2016 FINAL  Final  Culture, blood (Routine X 2) w Reflex to ID Panel     Status: None   Collection Time: 10/29/16  2:47 AM  Result Value Ref Range Status   Specimen Description BLOOD RIGHT HAND  Final   Special Requests IN PEDIATRIC BOTTLE 1CC  Final   Culture NO GROWTH 5 DAYS  Final   Report Status 11/03/2016 FINAL  Final  Culture, blood (Routine X 2) w Reflex to ID Panel     Status: None   Collection Time: 10/29/16  5:51 AM  Result Value Ref Range Status   Specimen Description BLOOD RIGHT ARM  Final   Special Requests BOTTLES DRAWN AEROBIC AND ANAEROBIC 5CC   Final   Culture NO GROWTH 5 DAYS  Final   Report Status 11/03/2016 FINAL  Final  Culture, respiratory (NON-Expectorated)     Status: None   Collection Time: 10/29/16 10:21 AM  Result Value Ref Range Status   Specimen Description TRACHEAL ASPIRATE  Final   Special Requests NONE  Final   Gram Stain   Final    FEW WBC PRESENT, PREDOMINANTLY PMN MODERATE GRAM NEGATIVE RODS RARE GRAM POSITIVE COCCI    Culture Consistent with normal respiratory flora.  Final   Report Status 10/31/2016 FINAL  Final  Culture, respiratory (NON-Expectorated)     Status: None   Collection Time: 10/31/16 12:37 PM  Result Value Ref Range Status   Specimen Description TRACHEAL ASPIRATE  Final   Special Requests Normal  Final   Gram Stain   Final    RARE WBC PRESENT, PREDOMINANTLY MONONUCLEAR RARE GRAM NEGATIVE COCCOBACILLI     Culture RARE CANDIDA ALBICANS  Final   Report Status 11/03/2016 FINAL  Final  Culture, blood (single) w Reflex to ID Panel     Status: None (Preliminary result)   Collection Time: 10/31/16  2:08 PM  Result Value Ref Range Status   Specimen Description BLOOD RIGHT ANTECUBITAL  Final   Special Requests IN PEDIATRIC BOTTLE 3CC  Final   Culture NO GROWTH 4 DAYS  Final   Report Status PENDING  Incomplete  Aerobic Culture (superficial specimen)     Status: None (Preliminary result)   Collection Time: 11/03/16  8:46 AM  Result Value Ref Range Status   Specimen Description WOUND RIGHT THIGH  Final   Special Requests NONE  Final   Gram Stain   Final    RARE WBC PRESENT, PREDOMINANTLY PMN NO ORGANISMS SEEN    Culture NO GROWTH 1 DAY  Final   Report Status PENDING  Incomplete  Culture, blood (routine x 2)     Status: None (Preliminary result)   Collection Time: 11/03/16  5:50 PM  Result Value Ref Range Status   Specimen Description BLOOD RIGHT ANTECUBITAL  Final   Special Requests BOTTLES DRAWN AEROBIC AND ANAEROBIC 10ML EACH  Final   Culture NO GROWTH < 24 HOURS  Final   Report Status PENDING  Incomplete  Culture, blood (routine x 2)     Status: None (Preliminary result)   Collection Time: 11/03/16  5:58 PM  Result Value Ref Range Status   Specimen Description BLOOD RIGHT ANTECUBITAL  Final   Special Requests BOTTLES DRAWN AEROBIC AND ANAEROBIC 10CC EACH  Final   Culture NO GROWTH < 24 HOURS  Final   Report Status PENDING  Incomplete  C difficile quick scan w PCR reflex     Status: Abnormal   Collection Time: 11/03/16  6:21 PM  Result Value Ref Range Status   C Diff antigen POSITIVE (A) NEGATIVE Final   C Diff toxin POSITIVE (A) NEGATIVE Final   C Diff interpretation Toxin producing C. difficile detected.  Final    Comment: CRITICAL RESULT CALLED TO, READ BACK BY AND VERIFIED WITH: A.HARGETT,RN AT 2322 BY L.PITT 11/03/16     Studies/Results: Dg Chest Port 1 View  Result Date:  11/05/2016 CLINICAL DATA:  Respiratory failure. EXAM: PORTABLE CHEST 1 VIEW COMPARISON:  Radiograph of November 04, 2016. FINDINGS: Stable cardiomegaly. Bilateral chest tubes are unchanged in position without evidence of pneumothorax. Endotracheal and nasogastric tubes are unchanged in position. Stable position of left subclavian catheter with distal tip in expected position of SVC. Status post coronary bypass graft. Stable bibasilar opacities, left greater than right consistent with atelectasis or possibly inflammation. Bony thorax is unremarkable. IMPRESSION: Stable support apparatus. Bilateral chest tubes are again noted without pneumothorax. Stable bibasilar opacities, left greater than right, as described above. Electronically Signed   By: Jeneen Rinks  Murlean Caller, M.D.   On: 11/05/2016 07:44   Dg Chest Port 1 View  Result Date: 11/04/2016 CLINICAL DATA:  70 year old male status post cardiac surgery, with repair of ascending thoracic aneurysm, aortic valve replacement for bicuspid valve, mitral valve annuloplasty, and coronary artery bypass grafting 10/22/2016. Delayed primary closure 10/26/2016 EXAM: PORTABLE CHEST 1 VIEW COMPARISON:  11/04/2016 FINDINGS: Cardiomediastinal silhouette unchanged. Surgical changes of median sternotomy, aortic valve replacement, mitral valve annuloplasty, and CABG. Unchanged position of endotracheal tube, terminating 3.8 cm above the carina. Left upper extremity PICC unchanged, terminating superior vena cava. Unchanged position of left and right large bore thoracostomy tubes. Additional mediastinal/ pleural drains and epicardial pacing leads not visualized, although this may be related to technique. Similar appearance of basilar opacities at the lung bases obscuring the hemidiaphragm and the right heart border. No pneumothorax. Similar appearance of mixed interstitial opacities of the bilateral lungs. No interlobular septal thickening. IMPRESSION: Similar appearance of the lungs,  with persisting basilar opacities, potentially combination of atelectasis/ consolidation. No evidence of significant edema or pneumothorax. Unchanged endotracheal tube, bilateral thoracostomy tubes, and left upper extremity PICC. Surgical changes of median sternotomy, aortic valve repair/ ascending aortic replacement, mitral valve annuloplasty, and CABG. Signed, Dulcy Fanny. Earleen Newport, DO [facility] Radiology Electronically Signed   By: Corrie Mckusick D.O.   On: 11/04/2016 20:36   Dg Chest Port 1 View  Result Date: 11/04/2016 CLINICAL DATA:  Intubation. EXAM: PORTABLE CHEST 1 VIEW COMPARISON:  11/03/2006. FINDINGS: Endotracheal tube and NG tube noted in good anatomic position. Prior CABG. Stable cardiomegaly. Developing left lower lobe pulmonary infiltrate. Low lung volumes with bibasilar atelectasis. Infiltrates/edema. IMPRESSION: 1. Lines and tubes including left chest tube in stable position. No pneumothorax. 2. Prior CABG.  Stable cardiomegaly. 3.  Developing left lower lobe infiltrate.  Low lung volumes. Electronically Signed   By: Marcello Moores  Register   On: 11/04/2016 08:26     Assessment/Plan: Post cardiotomy shock             CABG, MVR, AVR, aneurysm repair . On lasix gtt. Pressure remains soft, off pressors currently.   Fever post cardiac surgery - likely multifactorial. Fevers started closely after the surgery, could be from clots vs ischemic digits, but now also has cdiff. Other Cultures negative. WBC trending down, remains febrile.  -cont PO vanc and po flagyl x14 days. Hold any other abx for now. Monitor fever curve.  - he has a right sided effusion on CXR which is not improving with lasix or chest tube. Consider repositioning? Per Nursing he has erythema around sternal wound. I was not able to express d/c, did not note significant erythema. If CVTS feels he has wound infection, will start vanco and cefepime (may make his C diff worse) IV  Cdiff  colitis - on po vanc as above.  Afib - on amio.  Gangrenous digits on both hands - 2/2 to DIC. ortho on board. Recommended amputation when patient is stable.   Transaminitis-  Likely from shock, improved significantly  VDRF - planning for trach soon. Management per PCCM.  Hypernatremia - likely from free water loss.   Met Alkolosis - likely from contraction alkalosis. May need break from lasix and some IVF.    Total days of antibiotics: day 3 of PO vanc. 7 days of IV vanc off 12/11. (Off merrem/anidulafungin)          [name], [name] Infectious Diseases (pager) [phone] [email or web address] 11/05/2016, 8:43 AM  LOS: 16 days

## 2016-11-05 NOTE — Progress Notes (Signed)
PULMONARY / CRITICAL CARE MEDICINE   Name: Scott [name] MRN: [id number] DOB: 08-Sep-1946    ADMISSION DATE:  10/20/2016 CONSULTATION DATE: 12/1  REFERRING MD: Ricard Dillon  CHIEF COMPLAINT:  CAD  HISTORY OF PRESENT ILLNESS:   70 yo former smoker with known CAD and ascending aorta aneurysm, who was taken to OR 11/30 for cabg x 3, aortic root replacement, MVR replacement, thoracic cavity left open and had extended pump time in OR.  SUBJECTIVE:  Tolerating pressure support.  Off pressors but hypotensive.  Still requiring high dose sedation.  Still having fever. Not extubatable   VITAL SIGNS: BP 97/79   Pulse 92   Temp (!) 101.8 F (38.8 C)   Resp 18   Ht 5\' 9"  (1.753 m)   Wt 255 lb 15.3 oz (116.1 kg)   SpO2 100%   BMI 37.80 kg/m   HEMODYNAMICS: CVP:  [0 mmHg-17 mmHg] 17 mmHg  VENTILATOR SETTINGS: Vent Mode: PRVC FiO2 (%):  [40 %-100 %] 50 % Set Rate:  [12 bmp] 12 bmp Vt Set:  [570 mL] 570 mL PEEP:  [5 cmH20] 5 cmH20 Plateau Pressure:  [16 cmH20-22 cmH20] 16 cmH20  INTAKE / OUTPUT: I/O last 3 completed shifts: In: 7062.1 [I.V.:4267.1; NG/GT:2530; IV Piggyback:265] Out: [id number]; Stool:725; Chest Tube:650]  PHYSICAL EXAMINATION: General: sedated but arouses Neuro: RASS -1 HEENT:  ETT in place Cardiovascular: irregular, chest closed Lungs:  No wheeze Abdomen:  Soft, non tender Ext: 1+ edema, extremities cool Skin: Ischemic changes in fingers b/l   LABS:  BMET  Recent Labs Lab 11/03/16 0449 11/04/16 0400 11/05/16 0500  NA 145 147* 147*  K 4.1 3.7 3.5  CL 105 109 107  CO2 34* 31 33*  BUN 35* 39* 44*  CREATININE 0.76 0.78 1.00  GLUCOSE 120* 160* 150*    Electrolytes  Recent Labs Lab 11/01/16 0415 11/02/16 0400 11/03/16 0449 11/04/16 0400 11/05/16 0500  CALCIUM 7.8* 8.1* 7.9* 8.1* 7.8*  MG 2.2 2.2 1.9  --   --   PHOS 3.1 3.4  --   --   --     CBC  Recent Labs Lab 11/03/16 0449 11/04/16 0400 11/05/16 0500  WBC 18.1* 12.5* 9.9   HGB 10.1* 10.1* 9.8*  HCT 33.8* 33.5* 32.5*  PLT 244 279 321    Coag's  Recent Labs Lab 11/02/16 0400 11/03/16 0449 11/04/16 0400 11/04/16 1955  APTT 78* 75* 73*  --   INR 2.61 2.52  --  1.88    Sepsis Markers  Recent Labs Lab 10/29/16 0930 10/29/16 1628  LATICACIDVEN 2.0* 2.0*    ABG  Recent Labs Lab 11/04/16 1950  PHART 7.511*  PCO2ART 43.5  PO2ART 274.0*    Liver Enzymes  Recent Labs Lab 11/02/16 0400 11/03/16 0449 11/05/16 0500  AST 51* 46* 46*  ALT 114* 84* 55  ALKPHOS 115 129* 111  BILITOT 1.5* 1.4* 1.0  ALBUMIN 2.3* 2.1* 1.9*    Cardiac Enzymes No results for input(s): TROPONINI, PROBNP in the last 168 hours.  Glucose  Recent Labs Lab 11/04/16 1158 11/04/16 1614 11/04/16 1937 11/05/16 0007 11/05/16 0424 11/05/16 0829  GLUCAP 291* 130* 117* 161* 145* 154*    Imaging Dg Chest Port 1 View  Result Date: 11/05/2016 CLINICAL DATA:  Respiratory failure. EXAM: PORTABLE CHEST 1 VIEW COMPARISON:  Radiograph of November 04, 2016. FINDINGS: Stable cardiomegaly. Bilateral chest tubes are unchanged in position without evidence of pneumothorax. Endotracheal and nasogastric tubes are unchanged in position. Stable position of left  subclavian catheter with distal tip in expected position of SVC. Status post coronary bypass graft. Stable bibasilar opacities, left greater than right consistent with atelectasis or possibly inflammation. Bony thorax is unremarkable. IMPRESSION: Stable support apparatus. Bilateral chest tubes are again noted without pneumothorax. Stable bibasilar opacities, left greater than right, as described above. Electronically Signed   By: Marijo Conception, M.D.   On: 11/05/2016 07:44   Dg Chest Port 1 View  Result Date: 11/04/2016 CLINICAL DATA:  70 year old male status post cardiac surgery, with repair of ascending thoracic aneurysm, aortic valve replacement for bicuspid valve, mitral valve annuloplasty, and coronary artery bypass  grafting 10/22/2016. Delayed primary closure 10/26/2016 EXAM: PORTABLE CHEST 1 VIEW COMPARISON:  11/04/2016 FINDINGS: Cardiomediastinal silhouette unchanged. Surgical changes of median sternotomy, aortic valve replacement, mitral valve annuloplasty, and CABG. Unchanged position of endotracheal tube, terminating 3.8 cm above the carina. Left upper extremity PICC unchanged, terminating superior vena cava. Unchanged position of left and right large bore thoracostomy tubes. Additional mediastinal/ pleural drains and epicardial pacing leads not visualized, although this may be related to technique. Similar appearance of basilar opacities at the lung bases obscuring the hemidiaphragm and the right heart border. No pneumothorax. Similar appearance of mixed interstitial opacities of the bilateral lungs. No interlobular septal thickening. IMPRESSION: Similar appearance of the lungs, with persisting basilar opacities, potentially combination of atelectasis/ consolidation. No evidence of significant edema or pneumothorax. Unchanged endotracheal tube, bilateral thoracostomy tubes, and left upper extremity PICC. Surgical changes of median sternotomy, aortic valve repair/ ascending aortic replacement, mitral valve annuloplasty, and CABG. Signed, Dulcy Fanny. Earleen Newport, DO [facility] Radiology Electronically Signed   By: Corrie Mckusick D.O.   On: 11/04/2016 20:36    STUDIES:  12.5 dopplers lowers>>>neg 12/6 dopplers arms >>>neg 12/7 abdo us>>>Cholelithiasis without sonographic evidence for acute cholecystitis. No biliary dilatation.Increased hepatic echogenicity, suggestive of steatosis. Ascites. 12/7 echo>>>60-65%, rv poor images but appeared wnl  CULTURES: 12/1 bc >>>neg  12/7 sputum>>> normal flora  12/4 BC x 4>>>neg 12/7 BC>>>  12/12 c dif>>++ 12/12 bc>> 12/12 wound>>  ANTIBIOTICS: Per ID 11/30 levaquin>>off 12/3 12/3 ceftriaxone>>>12/7 12/3 vanc>>> off 12/7  meropenem>>>off 12/7 eraxis>>>off Acyclovir>>12/10>> Flagyl 12/13>> Vanco po 12/12>>  SIGNIFICANT EVENTS: 11/30 surgery, hypoxia 12/4 closed chest 12/7 high fevers, LFt up  LINES/TUBES: 11/30 OTT>> 11/30 rt I j PA cath>>12/4 12/4 picc left >>> 11/30 l rad aline>>12/7 11/30 CT x 4>> 11/30 wound vac>>12/4  ASSESSMENT / PLAN:  PULMONARY A: Acute hypoxic respiratory failure after CABG, MVR. P:   Pressure support wean - not stable for extubation trial Likely will need trach to assist with vent weaning F/u CXR He will need trach  CARDIOVASCULAR A:  S/p CABG, MVR, ascending aortic root replacement. Hypotension. A fib. Rt atrial clot. B/l hand ischemia. P:  Wean pressors to keep MAP > 65 Angiomax Lasix, primacor gtt per cardiology, note neg 4 litre  RENAL A:   Hypervolemia. P:   Lasix gtt to restart 12/11  GASTROINTESTINAL A:   Nutrition. P:   Tube feeds while on vent Protonix for SUP  HEMATOLOGIC A:   DIC - resolved. Anemia of critical illness. P:  F/u CBC   INFECTIOUS A:   Fever  + cdif P:   Abx per ID  ENDOCRINE CBG (last 3)   Recent Labs  11/05/16 0007 11/05/16 0424 11/05/16 0829  GLUCAP 161* 145* 154*     A: DM. P:   SSI with lantus  NEUROLOGIC A:  Acute metabolic encephalopathy. sedated P:   RASS goal 0  CC time 30 minutes.  [name] ACNP Maryanna Shape PCCM Pager [phone] till 3 pm If no answer page [phone] 11/05/2016, 9:03 AM  Remains on full vent support.  Anasarca.  Opens eyes.  Redness at upper end of chest wound.  B/l crackles.  Abd soft.  HR regular.  WBC 9.9, Hb 9.8, Creatinine 1  CXR - LLL ASD  Assessment/plan:  Fever, C diff. - flagyl, enteral vancomycin  Chest wall redness. - per TCTS  Acute respiratory failure. - overall condition likely prohibitive for extubation trial at this time - likely would need trach to assist with vent wean >> timing per TCTS in setting of sternal  wound  Hypervolemia. - lasix per TCTS  CC time by me independent of APP time 31 minutes.  Chesley Mires, MD [facility] Pulmonary/Critical Care 11/05/2016, 11:47 AM Pager:  [phone] After 3pm call: [phone]

## 2016-11-05 NOTE — Consult Note (Signed)
ORTHOPAEDIC CONSULTATION  REQUESTING PHYSICIAN: Rexene Alberts, MD  Chief Complaint: Gangrene bilateral upper extremities and bilateral lower extremities secondary to DIC  HPI: Scott [name] is a 70 y.o. male who presents with cardiac disease and underwent extensive cardiac surgery. Postoperatively patient developed DIC and developed gangrenous changes to all of his fingers excluding the right thumb and both feet.  Past Medical History:  Diagnosis Date  . Arthritis    "hips, shoulders; knees; back" (10/20/2016)  . Bicuspid aortic valve   . BPH (benign prostatic hypertrophy)   . Carpal tunnel syndrome of right wrist   . Coronary artery disease involving native coronary artery of native heart with unstable angina pectoris ([place]) 10/20/2016  . Diverticulitis   . GERD (gastroesophageal reflux disease)   . Gout   . Heart murmur   . Hyperlipemia   . Hypertension   . Postoperative atrial fibrillation ([place]) 10/24/2016  . RLS (restless legs syndrome)   . S/P aortic valve replacement with bioprosthetic valve 10/22/2016   25 mm [facility] Ease bovine pericardial bioprosthetic tissue valve  . S/P ascending aortic aneurysm repair 10/22/2016   28 mm supracoronary straight graft replacement of ascending thoracic aortic aneurysm  . S/P CABG x 3 10/22/2016   Sequential LIMA to Diag and LAD, SVG to distal LAD, open vein harvest right thigh  . S/P mitral valve repair 10/22/2016   Artificial Gore-tex neochord placement x6 - posterior annuloplasty band placed but removed due to systolic anterior motion of mitral valve  . Severe aortic stenosis   . Snores    Never been tested for sleep apnea  . Thoracic ascending aortic aneurysm ([place]) 10/21/2016  . Thrombocytopenia ([place]) 10/22/2016  . Type II diabetes mellitus ([place])    Past Surgical History:  Procedure Laterality Date  . AORTIC VALVE REPLACEMENT N/A 10/22/2016   Procedure: AORTIC VALVE REPLACEMENT (AVR) WITH SIZE 25 MM MAGNA EASE  PERICARDIAL BIOPROSTHESIS - AORTIC;  Surgeon: Rexene Alberts, MD;  Location: [place];  Service: Open Heart Surgery;  Laterality: N/A;  . CARDIAC CATHETERIZATION N/A 10/20/2016   Procedure: Right/Left Heart Cath and Coronary Angiography;  Surgeon: Leonie Man, MD;  Location: [place] [facility];  Service: Cardiovascular;  Laterality: N/A;  . CARPAL TUNNEL RELEASE Right 11/28/2013   Procedure: RIGHT WRIST CARPAL TUNNEL RELEASE;  Surgeon: Lorn Junes, MD;  Location: [place];  Service: Orthopedics;  Laterality: Right;  . CATARACT EXTRACTION W/ INTRAOCULAR LENS  IMPLANT, BILATERAL Bilateral 1978  . COLONOSCOPY    . CORONARY ARTERY BYPASS GRAFT N/A 10/22/2016   Procedure: CORONARY ARTERY BYPASS GRAFTING (CABG)x 2 WITH LIMA TO DIAGONAL, OPEN  HARVESTING OF RIGHT SAPHENOUS VEIN FOR VEIN GRAFT TO LAD;  Surgeon: Rexene Alberts, MD;  Location: [place];  Service: Open Heart Surgery;  Laterality: N/A;  . FRACTURE SURGERY    . INGUINAL HERNIA REPAIR Right 1998  . LIPOMA EXCISION Right 2008   "side of my head"  . MITRAL VALVE REPAIR N/A 10/22/2016   Procedure: MITRAL VALVE REPAIR (MVR) WITH SIZE 30 SORIN ANNULOFLEX ANNULOPLASTY RING WITH SUBSEQUENT REMOVAL OF RING;  Surgeon: Rexene Alberts, MD;  Location: [place];  Service: Open Heart Surgery;  Laterality: N/A;  . PENECTOMY  2007   Peyronie's disease   . PENILE PROSTHESIS IMPLANT  2009  . SHOULDER OPEN ROTATOR CUFF REPAIR Right 2006  . STERNAL CLOSURE N/A 10/26/2016   Procedure: STERNAL WASHOUT AND DELAYED PRIMARY CLOSURE;  Surgeon: Rexene Alberts,  MD;  Location: [place];  Service: Thoracic;  Laterality: N/A;  . TEE WITHOUT CARDIOVERSION N/A 10/26/2016   Procedure: TRANSESOPHAGEAL ECHOCARDIOGRAM (TEE);  Surgeon: Rexene Alberts, MD;  Location: [place];  Service: Thoracic;  Laterality: N/A;  . TEE WITHOUT CARDIOVERSION N/A 10/22/2016   Procedure: TRANSESOPHAGEAL ECHOCARDIOGRAM (TEE);  Surgeon: Rexene Alberts, MD;  Location: [place];  Service:  Open Heart Surgery;  Laterality: N/A;  . THORACIC AORTIC ANEURYSM REPAIR  10/22/2016   Procedure: ASCENDING AORTIC  ANEURYSM REPAIR (AAA) WITH 28 MM HEMASHIELD PLATINUM WOVEN DOUBLE VELOUR VASCULAR GRAFT;  Surgeon: Rexene Alberts, MD;  Location: [place];  Service: Open Heart Surgery;;  . TONSILLECTOMY  ~ 1955  . TRANSURETHRAL RESECTION OF PROSTATE  2005  . TRIGGER FINGER RELEASE Bilateral    several lt and rt hands  . TRIGGER FINGER RELEASE Right 11/28/2013   Procedure: RIGHT TRIGGER FINGER  RELEASE (TENDON SHEATH INCISION);  Surgeon: Lorn Junes, MD;  Location: [place];  Service: Orthopedics;  Laterality: Right;  . WRIST FRACTURE SURGERY Right ~ 9   Social History   Social History  . Marital status: Married    Spouse name: N/A  . Number of children: N/A  . Years of education: N/A   Occupational History  . [employer]    retired   Social History Main Topics  . Smoking status: Former Smoker    Years: 3.00    Types: Pipe, Cigars    Quit date: 1984  . Smokeless tobacco: Never Used  . Alcohol use 6.0 oz/week    10 Cans of beer per week  . Drug use: No  . Sexual activity: Not Currently   Other Topics Concern  . None   Social History Narrative  . None   Family History  Problem Relation Age of Onset  . Lung cancer Mother   . Clotting disorder Father     No details  . Heart disease Sister 64    Stents  . Cancer Sister     Throat   - negative except otherwise stated in the family history section Allergies  Allergen Reactions  . Doxycycline Hives  . Penicillins Hives     Has patient had a PCN reaction causing immediate rash, facial/tongue/throat swelling, SOB or lightheadedness with hypotension: No Has patient had a PCN reaction causing severe rash involving mucus membranes or skin necrosis: No Has patient had a PCN reaction that required hospitalization: No Has patient had a PCN reaction occurring within the last 10 years:  Yes If all of the above answers are "NO", then may proceed with Cephalosporin use.   . Sulfa Antibiotics Hives   Prior to Admission medications   Medication Sig Start Date End Date Taking? Authorizing Provider  allopurinol (ZYLOPRIM) 300 MG tablet Take 300 mg by mouth daily.   Yes Historical Provider, MD  aspirin 81 MG tablet Take 81 mg by mouth 2 (two) times daily.    Yes Historical Provider, MD  Calcium-Magnesium-Zinc (CAL-MAG-ZINC PO) Take 1-2 tablets by mouth 2 (two) times daily. 1 tablet in the AM and 2 tablets in the PM.   Yes Historical Provider, MD  Cholecalciferol (VITAMIN D3) 1000 units CAPS Take 1,000 Units by mouth at bedtime.    Yes Historical Provider, MD  enalapril (VASOTEC) 20 MG tablet Take 20 mg by mouth 2 (two) times daily.   Yes Historical Provider, MD  finasteride (PROSCAR) 5 MG tablet Take 5 mg by mouth daily.  Yes Historical Provider, MD  gemfibrozil (LOPID) 600 MG tablet Take 600 mg by mouth 2 (two) times daily before a meal.   Yes Historical Provider, MD  Melatonin 10 MG CAPS Take 10 mg by mouth at bedtime.    Yes Historical Provider, MD  metFORMIN (GLUCOPHAGE-XR) 500 MG 24 hr tablet Take 500 mg by mouth at bedtime.  08/21/16  Yes Historical Provider, MD  metoprolol succinate (TOPROL XL) 25 MG 24 hr tablet Take 1 tablet (25 mg total) by mouth daily. 10/19/16  Yes Minus Breeding, MD  Omega-3 Fatty Acids (FISH OIL) 1000 MG CPDR Take 2,000 mg by mouth 2 (two) times daily.    Yes Historical Provider, MD  rOPINIRole (REQUIP) 2 MG tablet Take 2 mg by mouth See admin instructions. Takes 2mg  at 4:30pm and 2mg  at 9:30pm.   Yes Historical Provider, MD  Turmeric 500 MG CAPS Take 500 mg by mouth 2 (two) times daily.    Yes Historical Provider, MD  vitamin E 400 UNIT capsule Take 400 Units by mouth daily.   Yes Historical Provider, MD  nitroGLYCERIN (NITROSTAT) 0.4 MG SL tablet Place 0.4 mg under the tongue every 5 (five) minutes as needed.  10/16/16   Historical Provider, MD   Dg  Chest Port 1 View  Result Date: 11/04/2016 CLINICAL DATA:  70 year old male status post cardiac surgery, with repair of ascending thoracic aneurysm, aortic valve replacement for bicuspid valve, mitral valve annuloplasty, and coronary artery bypass grafting 10/22/2016. Delayed primary closure 10/26/2016 EXAM: PORTABLE CHEST 1 VIEW COMPARISON:  11/04/2016 FINDINGS: Cardiomediastinal silhouette unchanged. Surgical changes of median sternotomy, aortic valve replacement, mitral valve annuloplasty, and CABG. Unchanged position of endotracheal tube, terminating 3.8 cm above the carina. Left upper extremity PICC unchanged, terminating superior vena cava. Unchanged position of left and right large bore thoracostomy tubes. Additional mediastinal/ pleural drains and epicardial pacing leads not visualized, although this may be related to technique. Similar appearance of basilar opacities at the lung bases obscuring the hemidiaphragm and the right heart border. No pneumothorax. Similar appearance of mixed interstitial opacities of the bilateral lungs. No interlobular septal thickening. IMPRESSION: Similar appearance of the lungs, with persisting basilar opacities, potentially combination of atelectasis/ consolidation. No evidence of significant edema or pneumothorax. Unchanged endotracheal tube, bilateral thoracostomy tubes, and left upper extremity PICC. Surgical changes of median sternotomy, aortic valve repair/ ascending aortic replacement, mitral valve annuloplasty, and CABG. Signed, Dulcy Fanny. Earleen [place], DO [facility] Electronically Signed   By: Corrie Mckusick D.O.   On: 11/04/2016 20:36   Dg Chest Port 1 View  Result Date: 11/04/2016 CLINICAL DATA:  Intubation. EXAM: PORTABLE CHEST 1 VIEW COMPARISON:  11/03/2006. FINDINGS: Endotracheal tube and NG tube noted in good anatomic position. Prior CABG. Stable cardiomegaly. Developing left lower lobe pulmonary  infiltrate. Low lung volumes with bibasilar atelectasis. Infiltrates/edema. IMPRESSION: 1. Lines and tubes including left chest tube in stable position. No pneumothorax. 2. Prior CABG.  Stable cardiomegaly. 3.  Developing left lower lobe infiltrate.  Low lung volumes. Electronically Signed   By: Marcello Moores  Register   On: 11/04/2016 08:26   - pertinent xrays, CT, MRI studies were reviewed and independently interpreted  Positive ROS: All other systems have been reviewed and were otherwise negative with the exception of those mentioned in the HPI and as above.  Physical Exam: General: Patient is intubated but does respond to questions. Psychiatric: Patient is competent for consent with normal mood and affect Lymphatic: Patient has lymphedema in  both lower extremities. Cardiovascular: Edema with edema blisters bilateral lower extremities Respiratory: Patient is currently intubated and ventilated GI: No organomegaly, abdomen is soft and non-tender  Skin: Examination patient has dry gangrenous changes of all digits on both hands excluding the right thumb. There is no ascending cellulitis no signs of abscess. There are blisters involving the fingers on the right hand with more dry gangrenous changes on the left. Examination of both feet patient has swelling the feet or cold with superficial blisters without definitive gangrenous changes   Neurologic: Patient does not have protective sensation bilateral lower extremities.   MUSCULOSKELETAL:  Patient's both feet are cold there is dopplerable pulses and no definite dry gangrenous changes. Dry gangrene of all digits of the left hand distal to the PIP joint and ischemic changes up to the PIP joint on the right hand.  Assessment: Assessment: Dry gangrene of all digits of both hands excluding the right thumb up to the PIP joint, with ischemic cold changes of both feet without gangrenous changes.  Plan: Plan: Patient will need amputation of the fingers of  the hands at the level of the PIP joint excluding the right thumb and viability of the feet still to be determined. I will follow the patient once he is stable we will discuss operative treatment at this time the feet should declared themselves.  Thank you for the consult and the opportunity to see Mr. Arthur Mickley, [place] [phone] 7:21 AM

## 2016-11-05 NOTE — Progress Notes (Signed)
      [address]       [place] [address]             [phone]    Intubated  BP (!) 83/65   Pulse 89   Temp (!) 101.5 F (38.6 C)   Resp 16   Ht 5\' 9"  (1.753 m)   Wt 255 lb 15.3 oz (116.1 kg)   SpO2 97%   BMI 37.80 kg/m    Intake/Output Summary (Last 24 hours) at 11/05/16 1733 Last data filed at 11/05/16 1700  Gross per 24 hour  Intake          5457.95 ml  Output             4720 ml  Net           737.95 ml   CBG 150-160  No PM labs  Continue current care  Coreena Rubalcava C. Roxan Hockey, MD [facility] [phone]

## 2016-11-05 NOTE — Progress Notes (Addendum)
[facility] DAILY ICU PROGRESS NOTE                   [address]            [place] [address]          [phone]   10 Days Post-Op Procedure(s) (LRB): STERNAL WASHOUT AND DELAYED PRIMARY CLOSURE (N/A) TRANSESOPHAGEAL ECHOCARDIOGRAM (TEE) (N/A)  Total Length of Stay:  LOS: 16 days   Subjective: Short episode of bradycardia last pm , resolved quickly  Objective: Vital signs in last 24 hours: Temp:  [97 F (36.1 C)-102 F (38.9 C)] 101.8 F (38.8 C) (12/14 0700) Pulse Rate:  [82-128] 92 (12/14 0700) Cardiac Rhythm: Atrial fibrillation (12/14 0600) Resp:  [14-31] 18 (12/14 0700) BP: (76-161)/(53-108) 97/79 (12/14 0700) SpO2:  [97 %-100 %] 100 % (12/14 0700) FiO2 (%):  [40 %-100 %] 50 % (12/14 0600) Weight:  [255 lb 15.3 oz (116.1 kg)] 255 lb 15.3 oz (116.1 kg) (12/14 0500)  Filed Weights   11/03/16 1300 11/04/16 0315 11/05/16 0500  Weight: 266 lb 12.1 oz (121 kg) 255 lb 4.7 oz (115.8 kg) 255 lb 15.3 oz (116.1 kg)    Weight change: -10 lb 12.9 oz (-4.9 kg)    Vent Mode: PRVC FiO2 (%):  [40 %-100 %] 50 % Set Rate:  [12 bmp] 12 bmp Vt Set:  [570 mL] 570 mL PEEP:  [5 cmH20] 5 cmH20 Pressure Support:  [5 cmH20] 5 cmH20 Plateau Pressure:  [16 cmH20-22 cmH20] 16 cmH20    Hemodynamic parameters for last 24 hours: CVP:  [0 mmHg-16 mmHg] 12 mmHg  Intake/Output from previous day: 12/13 0701 - 12/14 0700 In: 4425.2 [I.V.:2690.2; NG/GT:1470; IV Piggyback:265] Out: 4174 [Urine:3735; Stool:250; Chest Tube:380]  Intake/Output this shift: No intake/output data recorded.  Current Meds: Scheduled Meds: . acyclovir  200 mg Oral QID  . aspirin EC  325 mg Oral Daily   Or  . aspirin  324 mg Per Tube Daily  . chlorhexidine gluconate (MEDLINE KIT)  15 mL Mouth Rinse BID  . feeding supplement (PRO-STAT SUGAR FREE 64)  30 mL Per Tube BID  . free water  200 mL Per Tube Q8H  . insulin aspart  0-24 Units Subcutaneous Q4H  . insulin glargine  15 Units Subcutaneous BID   . mouth rinse  15 mL Mouth Rinse QID  . metroNIDAZOLE  500 mg Per Tube TID  . pantoprazole sodium  40 mg Per Tube Q24H  . potassium chloride  40 mEq Per Tube Q12H  . potassium chloride (KCL MULTIRUN) 30 mEq in 265 mL IVPB  30 mEq Intravenous Once  . sodium chloride flush  3 mL Intravenous Q12H  . vancomycin  500 mg Per Tube Q6H   Continuous Infusions: . sodium chloride 20 mL/hr at 10/28/16 0400  . sodium chloride 30 mL/hr at 11/04/16 1300  . amiodarone 30 mg/hr (11/05/16 0700)  . dexmedetomidine 1.2 mcg/kg/hr (11/05/16 0700)  . feeding supplement (VITAL HIGH PROTEIN) 1,000 mL (11/05/16 0014)  . fentaNYL infusion INTRAVENOUS 175 mcg/hr (11/05/16 0700)  . furosemide (LASIX) infusion 10 mg/hr (11/05/16 0700)  . heparin 1,950 Units/hr (11/05/16 0700)  . phenylephrine (NEO-SYNEPHRINE) Adult infusion Stopped (11/03/16 1800)   PRN Meds:.acetaminophen (TYLENOL) oral liquid 160 mg/5 mL, Gerhardt's butt cream, ipratropium-albuterol, metoprolol, midazolam, morphine injection, ondansetron (ZOFRAN) IV, sodium chloride flush  General appearance: delirious and no distress Heart: irregularly irregular rhythm Lungs: clear anteriorly Abdomen: mod dist., + BS, minor ttp without guarding or  rebound Extremities: + edema, necrotic changes to fingers, appears to be demarkating Wound: right leg incis with blistering and drainage- same  Lab Results: CBC: Recent Labs  11/04/16 0400 11/05/16 0500  WBC 12.5* 9.9  HGB 10.1* 9.8*  HCT 33.5* 32.5*  PLT 279 321   BMET:  Recent Labs  11/04/16 0400 11/05/16 0500  NA 147* 147*  K 3.7 3.5  CL 109 107  CO2 31 33*  GLUCOSE 160* 150*  BUN 39* 44*  CREATININE 0.78 1.00  CALCIUM 8.1* 7.8*    CMET: Lab Results  Component Value Date   WBC 9.9 11/05/2016   HGB 9.8 (L) 11/05/2016   HCT 32.5 (L) 11/05/2016   PLT 321 11/05/2016   GLUCOSE 150 (H) 11/05/2016   CHOL 134 10/21/2016   TRIG 203 (H) 10/21/2016   HDL 28 (L) 10/21/2016   LDLCALC 65  10/21/2016   ALT 55 11/05/2016   AST 46 (H) 11/05/2016   NA 147 (H) 11/05/2016   K 3.5 11/05/2016   CL 107 11/05/2016   CREATININE 1.00 11/05/2016   BUN 44 (H) 11/05/2016   CO2 33 (H) 11/05/2016   TSH 1.93 10/19/2016   INR 1.88 11/04/2016   HGBA1C 6.6 (H) 10/21/2016   Results for orders placed or performed during the hospital encounter of 10/20/16  Surgical pcr screen     Status: None   Collection Time: 10/21/16  4:18 AM  Result Value Ref Range Status   MRSA, PCR NEGATIVE NEGATIVE Final   Staphylococcus aureus NEGATIVE NEGATIVE Final    Comment:        The Xpert SA Assay (FDA approved for NASAL specimens in patients over 66 years of age), is one component of a comprehensive surveillance program.  Test performance has been validated by [facility] for patients greater than or equal to 99 year old. It is not intended to diagnose infection nor to guide or monitor treatment.   Culture, blood (Routine X 2) w Reflex to ID Panel     Status: None   Collection Time: 10/23/16  3:18 PM  Result Value Ref Range Status   Specimen Description BLOOD RIGHT HAND  Final   Special Requests IN PEDIATRIC BOTTLE 1CC  Final   Culture NO GROWTH 6 DAYS  Final   Report Status 10/29/2016 FINAL  Final  Culture, blood (Routine X 2) w Reflex to ID Panel     Status: None   Collection Time: 10/23/16  3:23 PM  Result Value Ref Range Status   Specimen Description BLOOD RIGHT ANTECUBITAL  Final   Special Requests IN PEDIATRIC BOTTLE 3CC  Final   Culture NO GROWTH 6 DAYS  Final   Report Status 10/29/2016 FINAL  Final  Culture, blood (routine x 2)     Status: None   Collection Time: 10/26/16 12:45 PM  Result Value Ref Range Status   Specimen Description BLOOD RIGHT ANTECUBITAL  Final   Special Requests IN PEDIATRIC BOTTLE 1.5CC  Final   Culture NO GROWTH 5 DAYS  Final   Report Status 10/31/2016 FINAL  Final  Culture, blood (routine x 2)     Status: None   Collection Time: 10/26/16 12:50 PM  Result  Value Ref Range Status   Specimen Description BLOOD RIGHT HAND  Final   Special Requests IN PEDIATRIC BOTTLE 3CC  Final   Culture NO GROWTH 5 DAYS  Final   Report Status 10/31/2016 FINAL  Final  Culture, blood (Routine X 2) w Reflex to ID Panel  Status: None   Collection Time: 10/29/16  2:47 AM  Result Value Ref Range Status   Specimen Description BLOOD RIGHT HAND  Final   Special Requests IN PEDIATRIC BOTTLE 1CC  Final   Culture NO GROWTH 5 DAYS  Final   Report Status 11/03/2016 FINAL  Final  Culture, blood (Routine X 2) w Reflex to ID Panel     Status: None   Collection Time: 10/29/16  5:51 AM  Result Value Ref Range Status   Specimen Description BLOOD RIGHT ARM  Final   Special Requests BOTTLES DRAWN AEROBIC AND ANAEROBIC 5CC   Final   Culture NO GROWTH 5 DAYS  Final   Report Status 11/03/2016 FINAL  Final  Culture, respiratory (NON-Expectorated)     Status: None   Collection Time: 10/29/16 10:21 AM  Result Value Ref Range Status   Specimen Description TRACHEAL ASPIRATE  Final   Special Requests NONE  Final   Gram Stain   Final    FEW WBC PRESENT, PREDOMINANTLY PMN MODERATE GRAM NEGATIVE RODS RARE GRAM POSITIVE COCCI    Culture Consistent with normal respiratory flora.  Final   Report Status 10/31/2016 FINAL  Final  Culture, respiratory (NON-Expectorated)     Status: None   Collection Time: 10/31/16 12:37 PM  Result Value Ref Range Status   Specimen Description TRACHEAL ASPIRATE  Final   Special Requests Normal  Final   Gram Stain   Final    RARE WBC PRESENT, PREDOMINANTLY MONONUCLEAR RARE GRAM NEGATIVE COCCOBACILLI    Culture RARE CANDIDA ALBICANS  Final   Report Status 11/03/2016 FINAL  Final  Culture, blood (single) w Reflex to ID Panel     Status: None (Preliminary result)   Collection Time: 10/31/16  2:08 PM  Result Value Ref Range Status   Specimen Description BLOOD RIGHT ANTECUBITAL  Final   Special Requests IN PEDIATRIC BOTTLE 3CC  Final   Culture NO  GROWTH 4 DAYS  Final   Report Status PENDING  Incomplete  Aerobic Culture (superficial specimen)     Status: None (Preliminary result)   Collection Time: 11/03/16  8:46 AM  Result Value Ref Range Status   Specimen Description WOUND RIGHT THIGH  Final   Special Requests NONE  Final   Gram Stain   Final    RARE WBC PRESENT, PREDOMINANTLY PMN NO ORGANISMS SEEN    Culture NO GROWTH 1 DAY  Final   Report Status PENDING  Incomplete  Culture, blood (routine x 2)     Status: None (Preliminary result)   Collection Time: 11/03/16  5:50 PM  Result Value Ref Range Status   Specimen Description BLOOD RIGHT ANTECUBITAL  Final   Special Requests BOTTLES DRAWN AEROBIC AND ANAEROBIC 10ML EACH  Final   Culture NO GROWTH < 24 HOURS  Final   Report Status PENDING  Incomplete  Culture, blood (routine x 2)     Status: None (Preliminary result)   Collection Time: 11/03/16  5:58 PM  Result Value Ref Range Status   Specimen Description BLOOD RIGHT ANTECUBITAL  Final   Special Requests BOTTLES DRAWN AEROBIC AND ANAEROBIC 10CC EACH  Final   Culture NO GROWTH < 24 HOURS  Final   Report Status PENDING  Incomplete  C difficile quick scan w PCR reflex     Status: Abnormal   Collection Time: 11/03/16  6:21 PM  Result Value Ref Range Status   C Diff antigen POSITIVE (A) NEGATIVE Final   C Diff toxin POSITIVE (A) NEGATIVE  Final   C Diff interpretation Toxin producing C. difficile detected.  Final    Comment: CRITICAL RESULT CALLED TO, READ BACK BY AND VERIFIED WITH: A.HARGETT,RN AT 2322 BY L.PITT 11/03/16    PT/INR:  Recent Labs  11/04/16 1955  LABPROT 21.8*  INR 1.88   Radiology: Dg Chest Port 1 View  Result Date: 11/04/2016 CLINICAL DATA:  70 year old male status post cardiac surgery, with repair of ascending thoracic aneurysm, aortic valve replacement for bicuspid valve, mitral valve annuloplasty, and coronary artery bypass grafting 10/22/2016. Delayed primary closure 10/26/2016 EXAM: PORTABLE CHEST  1 VIEW COMPARISON:  11/04/2016 FINDINGS: Cardiomediastinal silhouette unchanged. Surgical changes of median sternotomy, aortic valve replacement, mitral valve annuloplasty, and CABG. Unchanged position of endotracheal tube, terminating 3.8 cm above the carina. Left upper extremity PICC unchanged, terminating superior vena cava. Unchanged position of left and right large bore thoracostomy tubes. Additional mediastinal/ pleural drains and epicardial pacing leads not visualized, although this may be related to technique. Similar appearance of basilar opacities at the lung bases obscuring the hemidiaphragm and the right heart border. No pneumothorax. Similar appearance of mixed interstitial opacities of the bilateral lungs. No interlobular septal thickening. IMPRESSION: Similar appearance of the lungs, with persisting basilar opacities, potentially combination of atelectasis/ consolidation. No evidence of significant edema or pneumothorax. Unchanged endotracheal tube, bilateral thoracostomy tubes, and left upper extremity PICC. Surgical changes of median sternotomy, aortic valve repair/ ascending aortic replacement, mitral valve annuloplasty, and CABG. Signed, Dulcy Fanny. Earleen Newport, DO [facility] Radiology Electronically Signed   By: Corrie Mckusick D.O.   On: 11/04/2016 20:36   Dg Chest Port 1 View  Result Date: 11/04/2016 CLINICAL DATA:  Intubation. EXAM: PORTABLE CHEST 1 VIEW COMPARISON:  11/03/2006. FINDINGS: Endotracheal tube and NG tube noted in good anatomic position. Prior CABG. Stable cardiomegaly. Developing left lower lobe pulmonary infiltrate. Low lung volumes with bibasilar atelectasis. Infiltrates/edema. IMPRESSION: 1. Lines and tubes including left chest tube in stable position. No pneumothorax. 2. Prior CABG.  Stable cardiomegaly. 3.  Developing left lower lobe infiltrate.  Low lung volumes. Electronically Signed   By: Marcello Moores  Register   On: 11/04/2016 08:26      Assessment/Plan: S/P Procedure(s) (LRB): STERNAL WASHOUT AND DELAYED PRIMARY CLOSURE (N/A) TRANSESOPHAGEAL ECHOCARDIOGRAM (TEE) (N/A)   1 remains critically ill but improving  2 VDRFplan for tracheostomy in near future 3 ortho seeing for necrotic/ischemic limbs- no current urgency for amputation 4 will change to amio per tube- monitor for bradycardia, rate controlled afib. CO-OX is 65 today on no inotropic support 5 check ekg for QTc 6 LFT's ok on amio  7 cont RX for CDiff- leukocytosis resolved 8 renal fxn remains stable with excellent diuresis 9 off angiomax- on heparin gtt 10 anemia stable 12 sugars reasonably controlled  GOLD,WAYNE E 11/05/2016 7:23 AM   I have seen and examined the patient and agree with the assessment and plan as outlined.  Slowly improving.  Input from Dr. Sharol Given appreciated.  Mild hypernatremia persists - will increase frequency of free H2O and discuss timing of tracheostomy w/ family.  Could consider stopping co-ox measurements and lasix drip and starting intermittent bolus lasix - will defer to advanced heart failure team.     Rexene Alberts, MD 11/05/2016 8:40 AM

## 2016-11-05 NOTE — Progress Notes (Signed)
ANTICOAGULATION CONSULT NOTE - Follow Up Consult  Pharmacy Consult for Heparin Indication: possible clot in RA and afib   Allergies  Allergen Reactions  . Doxycycline Hives  . Penicillins Hives     Has patient had a PCN reaction causing immediate rash, facial/tongue/throat swelling, SOB or lightheadedness with hypotension: No Has patient had a PCN reaction causing severe rash involving mucus membranes or skin necrosis: No Has patient had a PCN reaction that required hospitalization: No Has patient had a PCN reaction occurring within the last 10 years: Yes If all of the above answers are "NO", then may proceed with Cephalosporin use.   . Sulfa Antibiotics Hives    Patient Measurements: Height: 5\' 9"  (175.3 cm) Weight: 255 lb 4.7 oz (115.8 kg) IBW/kg (Calculated) : 70.7  Heparin dosing wt: 97 kg  Vital Signs: Temp: 101.8 F (38.8 C) (12/14 0545) BP: 104/72 (12/14 0530) Pulse Rate: 90 (12/14 0545)  Labs:  Recent Labs  11/03/16 0449 11/04/16 0400 11/04/16 1955 11/05/16 0500  HGB 10.1* 10.1*  --   --   HCT 33.8* 33.5*  --   --   PLT 244 279  --   --   APTT 75* 73*  --   --   LABPROT 27.7*  --  21.8*  --   INR 2.52  --  1.88  --   HEPARINUNFRC  --   --  <0.10* 0.21*  CREATININE 0.76 0.78  --   --     Estimated Creatinine Clearance: 107.8 mL/min (by C-G formula based on SCr of 0.78 mg/dL).  Assessment: Scott [name] on heparin for possible right atrial clot and afib. HIT ruled out (low heparin antibody and SRA negative).  Heparin level remains subtherapeutic (0.21) but trending up with rate increases. No issues with line or bleeding reported per RN.  Goal of Therapy:  Heparin level goal 0.3-0.7 units/ml Monitor platelets by anticoagulation protocol: Yes   Plan:  Increase heparin to 1950 units / hr Will f/u 8hr heparin level  Sherlon Handing, PharmD, BCPS Clinical pharmacist, pager [phone] 11/05/2016 5:47 AM

## 2016-11-05 NOTE — Progress Notes (Signed)
Patient ID: Scott [name], male   DOB: 1946-05-14, 70 y.o.   MRN: [id number]     Advanced Heart Failure Rounding Note  Referring Physician: Dr Roxy Manns Primary Physician: Dr Quintin Alto Primary Cardiologist:  Dr Percival Spanish   Reason for Consultation: Heart Failure   Subjective:    Admitted with chest pain. RHC//LHC with 3 vessel disease. Taken to the OR 11/30 for cabg x 3, repair of thoracic ascending aneurysm, AVR bioprosthetic,  MVR, and open chest with VAC placement.    Chest successfully closed 10/26/16.  Off milrinone and phenylephrine now.      CVP 14-15 this morning. Currently on lasix 10 mg/hr, good UOP, I/Os measured essentially even.  Remains intubated and sedated. Occasionally awakes and responds to commands per nursing.   HR in 70s on amiodarone gtt, in atrial flutter.   Suspect DIC and not HIT.  Now back on heparin gtt with RA clot and atrial fibrillation. HIT Ab and SRA negative.  Ongoing fevers. ID following.  May be from gangrenous digits. Now off IV vancomycin, anidulafungin and meropenem. C difficile came back positive.  This likely plays a role but fever pre-dates.  He is now on po vancomycin and Flagyl.   TEE 10/26/16 EF 40-45%, small cavity size, flattened/hypokinetic septum, s/p MV repair with mild MR, mild to moderately decreased RV systolic function. + RA thrombus.   Limited echo 10/29/16 LVEF 60-65%, unable to comment on AVR structure or function with poor windows.   Upper and lower extremity doppler studies: No DVTs  ABIs (12/8): Unable to doppler PTs, or get TBIs.   Objective:   Weight Range: 255 lb 15.3 oz (116.1 kg) Body mass index is 37.8 kg/m.   Vital Signs:   Temp:  [97.3 F (36.3 C)-102 F (38.9 C)] 101.8 F (38.8 C) (12/14 0700) Pulse Rate:  [85-128] 92 (12/14 0932) Resp:  [14-31] 18 (12/14 0932) BP: (76-161)/(53-108) 91/62 (12/14 0932) SpO2:  [97 %-100 %] 100 % (12/14 0932) FiO2 (%):  [40 %-100 %] 50 % (12/14 0932) Weight:  [255 lb 15.3 oz (116.1  kg)] 255 lb 15.3 oz (116.1 kg) (12/14 0500) Last BM Date: 11/04/16  Weight change: Filed Weights   11/03/16 1300 11/04/16 0315 11/05/16 0500  Weight: 266 lb 12.1 oz (121 kg) 255 lb 4.7 oz (115.8 kg) 255 lb 15.3 oz (116.1 kg)    Intake/Output:   Intake/Output Summary (Last 24 hours) at 11/05/16 0949 Last data filed at 11/05/16 0700  Gross per 24 hour  Intake          4025.46 ml  Output             3815 ml  Net           210.46 ml     Physical Exam: CVP 14-15 General: Remains intubated and sedated.  HEENT: + ETT.  Neck: supple. JVP 12. Carotids 2+ bilat; no bruits. No thyromegaly or nodule noted.   Cor: PMI nondisplaced. Mildly tachy, regular.  No M/G/R noted. Chest tubes in place.  Lungs: Scattered rhonchi, Mechanical breathing sounds present.   Abdomen: soft, ND, no HSM. No bruits or masses. +BS  Extremities: no clubbing, rash. 1+ ankle edema. Digital gangrene. Toes remain mottled, some improvement. Unable to doppler pedal pulses.  Neuro: Intubated and sedated.  GU: Foley in place  Telemetry: Reviewed, Currently aflutter 70s  Labs: CBC  Recent Labs  11/04/16 0400 11/05/16 0500  WBC 12.5* 9.9  HGB 10.1* 9.8*  HCT 33.5* 32.5*  MCV  94.6 95.3  PLT 279 097   Basic Metabolic Panel  Recent Labs  11/03/16 0449 11/04/16 0400 11/05/16 0500  NA 145 147* 147*  K 4.1 3.7 3.5  CL 105 109 107  CO2 34* 31 33*  GLUCOSE 120* 160* 150*  BUN 35* 39* 44*  CREATININE 0.76 0.78 1.00  CALCIUM 7.9* 8.1* 7.8*  MG 1.9  --   --    Liver Function Tests  Recent Labs  11/03/16 0449 11/05/16 0500  AST 46* 46*  ALT 84* 55  ALKPHOS 129* 111  BILITOT 1.4* 1.0  PROT 4.9* 5.0*  ALBUMIN 2.1* 1.9*   No results for input(s): LIPASE, AMYLASE in the last 72 hours. Cardiac Enzymes No results for input(s): CKTOTAL, CKMB, CKMBINDEX, TROPONINI in the last 72 hours.  BNP: BNP (last 3 results) No results for input(s): BNP in the last 8760 hours.  ProBNP (last 3 results) No  results for input(s): PROBNP in the last 8760 hours.   D-Dimer No results for input(s): DDIMER in the last 72 hours. Hemoglobin A1C No results for input(s): HGBA1C in the last 72 hours. Fasting Lipid Panel No results for input(s): CHOL, HDL, LDLCALC, TRIG, CHOLHDL, LDLDIRECT in the last 72 hours. Thyroid Function Tests No results for input(s): TSH, T4TOTAL, T3FREE, THYROIDAB in the last 72 hours.  Invalid input(s): FREET3  Other results:  Imaging/Studies:  Dg Chest Port 1 View  Result Date: 11/05/2016 CLINICAL DATA:  Respiratory failure. EXAM: PORTABLE CHEST 1 VIEW COMPARISON:  Radiograph of November 04, 2016. FINDINGS: Stable cardiomegaly. Bilateral chest tubes are unchanged in position without evidence of pneumothorax. Endotracheal and nasogastric tubes are unchanged in position. Stable position of left subclavian catheter with distal tip in expected position of SVC. Status post coronary bypass graft. Stable bibasilar opacities, left greater than right consistent with atelectasis or possibly inflammation. Bony thorax is unremarkable. IMPRESSION: Stable support apparatus. Bilateral chest tubes are again noted without pneumothorax. Stable bibasilar opacities, left greater than right, as described above. Electronically Signed   By: Marijo Conception, M.D.   On: 11/05/2016 07:44   Dg Chest Port 1 View  Result Date: 11/04/2016 CLINICAL DATA:  70 year old male status post cardiac surgery, with repair of ascending thoracic aneurysm, aortic valve replacement for bicuspid valve, mitral valve annuloplasty, and coronary artery bypass grafting 10/22/2016. Delayed primary closure 10/26/2016 EXAM: PORTABLE CHEST 1 VIEW COMPARISON:  11/04/2016 FINDINGS: Cardiomediastinal silhouette unchanged. Surgical changes of median sternotomy, aortic valve replacement, mitral valve annuloplasty, and CABG. Unchanged position of endotracheal tube, terminating 3.8 cm above the carina. Left upper extremity PICC unchanged,  terminating superior vena cava. Unchanged position of left and right large bore thoracostomy tubes. Additional mediastinal/ pleural drains and epicardial pacing leads not visualized, although this may be related to technique. Similar appearance of basilar opacities at the lung bases obscuring the hemidiaphragm and the right heart border. No pneumothorax. Similar appearance of mixed interstitial opacities of the bilateral lungs. No interlobular septal thickening. IMPRESSION: Similar appearance of the lungs, with persisting basilar opacities, potentially combination of atelectasis/ consolidation. No evidence of significant edema or pneumothorax. Unchanged endotracheal tube, bilateral thoracostomy tubes, and left upper extremity PICC. Surgical changes of median sternotomy, aortic valve repair/ ascending aortic replacement, mitral valve annuloplasty, and CABG. Signed, Dulcy Fanny. Earleen Newport, DO [facility] Electronically Signed   By: Corrie Mckusick D.O.   On: 11/04/2016 20:36   Dg Chest Port 1 View  Result Date: 11/04/2016 CLINICAL DATA:  Intubation. EXAM: PORTABLE  CHEST 1 VIEW COMPARISON:  11/03/2006. FINDINGS: Endotracheal tube and NG tube noted in good anatomic position. Prior CABG. Stable cardiomegaly. Developing left lower lobe pulmonary infiltrate. Low lung volumes with bibasilar atelectasis. Infiltrates/edema. IMPRESSION: 1. Lines and tubes including left chest tube in stable position. No pneumothorax. 2. Prior CABG.  Stable cardiomegaly. 3.  Developing left lower lobe infiltrate.  Low lung volumes. Electronically Signed   By: Marcello Moores  Register   On: 11/04/2016 08:26    Medications:     Scheduled Medications: . acyclovir  200 mg Oral QID  . amiodarone  400 mg Per Tube BID  . aspirin EC  325 mg Oral Daily   Or  . aspirin  324 mg Per Tube Daily  . chlorhexidine gluconate (MEDLINE KIT)  15 mL Mouth Rinse BID  . feeding supplement (PRO-STAT SUGAR  FREE 64)  30 mL Per Tube BID  . free water  200 mL Per Tube Q6H  . insulin aspart  0-24 Units Subcutaneous Q4H  . insulin glargine  15 Units Subcutaneous BID  . mouth rinse  15 mL Mouth Rinse QID  . metroNIDAZOLE  500 mg Per Tube TID  . pantoprazole sodium  40 mg Per Tube Q24H  . potassium chloride  40 mEq Per Tube Q12H  . sodium chloride flush  3 mL Intravenous Q12H  . vancomycin  500 mg Per Tube Q6H    Infusions: . sodium chloride 20 mL/hr at 10/28/16 0400  . sodium chloride 30 mL/hr at 11/04/16 1300  . dexmedetomidine 1 mcg/kg/hr (11/05/16 0757)  . feeding supplement (VITAL HIGH PROTEIN) 1,000 mL (11/05/16 0014)  . fentaNYL infusion INTRAVENOUS 150 mcg/hr (11/05/16 0752)  . furosemide (LASIX) infusion 10 mg/hr (11/05/16 0700)  . heparin 1,950 Units/hr (11/05/16 0700)  . phenylephrine (NEO-SYNEPHRINE) Adult infusion Stopped (11/03/16 1800)    PRN Medications: acetaminophen (TYLENOL) oral liquid 160 mg/5 mL, Gerhardt's butt cream, ipratropium-albuterol, metoprolol, midazolam, morphine injection, ondansetron (ZOFRAN) IV, sodium chloride flush   Assessment/Plan   Mr Sautter is 69 year old admitted with CP. Complex operation this admission.  CABG x 3 + bioprosthetic AVR for severe bicuspid AS + ascending aorta replacement + MV repair for flail P2.  MV repair complicated by severe SAM, ended up having to remove posterior annuloplasty band.  Chest closed on 12/3.   1. CAD:  10/22/16 S/P CABG x3 with chest left open.  Chest successfully closed am of 10/26/16. - Continue ASA, start statin eventually now that LFTs down.  2. Severe Aortic Stenosis: 11/302017 S/P AVR bioprostheic  3. Mitral regurgitation: Partial flail leaflet with MV repair.  Developed SAM initially after repair and posterior annuloplasty ring had to be removed.  SAM resolved. 4. Acute on chronic systolic CHF:  TEE at time of chest closure showed EF 40-45%, small cavity size, flattened/hypokinetic septum, s/p MV repair with  mild MR, mild to moderately decreased RV systolic function, thrombus in RA. Repeat echo 12/7 with EF 60-65%.  He is now off milrinone and phenylephrine.  CVP today is 14-15.  He is diuresing well with Lasix gtt at 10 mg/hr but gets a significant amount of fluid in => I/Os remain roughly even.  - With CVP remaining elevated and with the amount of fluid he gets in from meds, would continue Lasix gtt today at 10 mg/hr.  5. ID: ?Source of fever => ID following, do not think endocarditis.  Concerned digital gangrene is a contributor.  Now off meropenem, IV vancomycin, and anidulafungin. C difficile has been  identified and is playing a role.  6. Elevated LFTs: Most likely shock liver.  LFTs now trended down.  Transaminitis likely due to shock liver, amiodarone restarted.  - No statin for now with elevated LFTs, will need eventually.  - Abdominal US 10/29/16 with Cholelithiasis without sonographic evidence for acute cholecystitis. No biliary dilatation. Increased hepatic echogenicity, suggestive of steatosis. and Ascites.   7. Heme: HIT negative, suspect DIC. Platelet count has recovered.  - Now back on heparin gtt for RA thrombus and atrial fibrillation.  - Will need transition to warfarin eventually.  8. VDRF: CCM following for vent management. Pulmonary edema present, ?PNA. He will likely need tracheostomy.  9. Atrial fibrillation/flutter: Appears to be in flutter today.    - Continue heparin gtt.  Eventually will go on warfarin.  - Agree with transition to amiodarone per tube today, can use 400 mg bid.  10. PAD: Finger gangrene, mottled toes.  Unable to doppler PT pulses or get TBIs.  May be source of fever.  Seen by Dr Sharol Given, eventually will need amputation of most digits at PIP joint.   Loralie Champagne, MD 11/05/2016 9:49 AM  Advanced Heart Failure Team Pager [phone] (M-F; 7a - 4p)  Please contact [facility] for night-coverage after hours (4p -7a ) and weekends on [email or web address]

## 2016-11-05 NOTE — Progress Notes (Signed)
[facility] BRIEF SICU PROGRESS NOTE  10 Days Post-Op  S/P Procedure(s) (LRB): STERNAL WASHOUT AND DELAYED PRIMARY CLOSURE (N/A) TRANSESOPHAGEAL ECHOCARDIOGRAM (TEE) (N/A)   I have again updated the patient's wife and one of his daughter regarding the patient's current condition and progress over the past few days.  We discussed the indications, risks and potential benefits of tracheostomy as well as timing of the procedure.    Plan: We tentatively plan tracheostomy on Monday 11/09/2016 providing that the patient remains stable and/or improves between now and then.  All questions answered.  Rexene Alberts, MD 11/05/2016 5:00 PM

## 2016-11-06 LAB — GLUCOSE, CAPILLARY
GLUCOSE-CAPILLARY: 97 mg/dL (ref 65–99)
GLUCOSE-CAPILLARY: 131 mg/dL — AB (ref 65–99)
GLUCOSE-CAPILLARY: 137 mg/dL — AB (ref 65–99)
GLUCOSE-CAPILLARY: 103 mg/dL — AB (ref 65–99)
Glucose-Capillary: 114 mg/dL — ABNORMAL HIGH (ref 65–99)
Glucose-Capillary: 94 mg/dL (ref 65–99)

## 2016-11-06 LAB — CBC
HEMATOCRIT: 30.4 % — AB (ref 39.0–52.0)
HEMOGLOBIN: 9.2 g/dL — AB (ref 13.0–17.0)
MCH: 28.8 pg (ref 26.0–34.0)
MCHC: 30.3 g/dL (ref 30.0–36.0)
MCV: 95.3 fL (ref 78.0–100.0)
Platelets: 309 10*3/uL (ref 150–400)
RBC: 3.19 MIL/uL — AB (ref 4.22–5.81)
RDW: 17.6 % — AB (ref 11.5–15.5)
WBC: 8.4 10*3/uL (ref 4.0–10.5)

## 2016-11-06 LAB — BASIC METABOLIC PANEL
Anion gap: 6 (ref 5–15)
BUN: 41 mg/dL — AB (ref 6–20)
CO2: 33 mmol/L — ABNORMAL HIGH (ref 22–32)
Calcium: 7.4 mg/dL — ABNORMAL LOW (ref 8.9–10.3)
Chloride: 109 mmol/L (ref 101–111)
Creatinine, Ser: 0.93 mg/dL (ref 0.61–1.24)
GFR calc Af Amer: >60 mL/min (ref >60)
GLUCOSE: 105 mg/dL — AB (ref 65–99)
POTASSIUM: 3.1 mmol/L — AB (ref 3.5–5.1)
Sodium: 148 mmol/L — ABNORMAL HIGH (ref 135–145)

## 2016-11-06 LAB — COOXEMETRY PANEL
Carboxyhemoglobin: 1.8 % — ABNORMAL HIGH (ref 0.5–1.5)
Methemoglobin: 0.9 % (ref 0.0–1.5)
O2 SAT: 63.2 %
Total hemoglobin: 7.5 g/dL — ABNORMAL LOW (ref 12.0–16.0)

## 2016-11-06 LAB — AEROBIC CULTURE  (SUPERFICIAL SPECIMEN): CULTURE: NO GROWTH

## 2016-11-06 LAB — AEROBIC CULTURE W GRAM STAIN (SUPERFICIAL SPECIMEN)

## 2016-11-06 LAB — HEPARIN LEVEL (UNFRACTIONATED)
HEPARIN UNFRACTIONATED: 2 [IU]/mL — AB (ref 0.30–0.70)
HEPARIN UNFRACTIONATED: 0.8 [IU]/mL — AB (ref 0.30–0.70)
Heparin Unfractionated: 0.62 IU/mL (ref 0.30–0.70)
Heparin Unfractionated: 0.39 IU/mL (ref 0.30–0.70)

## 2016-11-06 MED ORDER — FREE WATER
300.0000 mL | Freq: Four times a day (QID) | Status: DC
  Administered 2016-11-06 – 2016-11-10 (×16): 300 mL

## 2016-11-06 MED ORDER — POTASSIUM CHLORIDE 20 MEQ/15ML (10%) PO SOLN
40.0000 meq | Freq: Four times a day (QID) | ORAL | Status: DC
  Administered 2016-11-06 – 2016-11-13 (×29): 40 meq
  Filled 2016-11-06 (×32): qty 30

## 2016-11-06 MED ORDER — SODIUM CHLORIDE 0.9 % IV SOLN
30.0000 meq | Freq: Once | INTRAVENOUS | Status: AC
  Administered 2016-11-06: 30 meq via INTRAVENOUS
  Filled 2016-11-06: qty 15

## 2016-11-06 MED ORDER — FAMOTIDINE 40 MG/5ML PO SUSR
20.0000 mg | Freq: Two times a day (BID) | ORAL | Status: DC
  Administered 2016-11-06 – 2016-11-18 (×25): 20 mg
  Filled 2016-11-06 (×25): qty 2.5

## 2016-11-06 MED ORDER — HEPARIN (PORCINE) IN NACL 100-0.45 UNIT/ML-% IJ SOLN
1950.0000 [IU]/h | INTRAMUSCULAR | Status: AC
  Administered 2016-11-06 – 2016-11-07 (×3): 1950 [IU]/h via INTRAVENOUS
  Filled 2016-11-06 (×4): qty 250

## 2016-11-06 NOTE — Progress Notes (Addendum)
TCTS DAILY ICU PROGRESS NOTE                   [address]            [place] [id number]          [phone]   11 Days Post-Op Procedure(s) (LRB): STERNAL WASHOUT AND DELAYED PRIMARY CLOSURE (N/A) TRANSESOPHAGEAL ECHOCARDIOGRAM (TEE) (N/A)  Total Length of Stay:  LOS: 17 days   Subjective: A bit more responsive today  Objective: Vital signs in last 24 hours: Temp:  [98.6 F (37 C)-101.8 F (38.8 C)] 98.6 F (37 C) (12/15 0748) Pulse Rate:  [76-115] 79 (12/15 0600) Cardiac Rhythm: Atrial fibrillation (12/15 0400) Resp:  [12-25] 12 (12/15 0600) BP: (78-129)/(56-99) 88/73 (12/15 0600) SpO2:  [89 %-100 %] 99 % (12/15 0600) FiO2 (%):  [50 %] 50 % (12/15 0400) Weight:  [258 lb 2.5 oz (117.1 kg)] 258 lb 2.5 oz (117.1 kg) (12/15 0500)  Filed Weights   11/04/16 0315 11/05/16 0500 11/06/16 0500  Weight: 255 lb 4.7 oz (115.8 kg) 255 lb 15.3 oz (116.1 kg) 258 lb 2.5 oz (117.1 kg)    Weight change: 2 lb 3.3 oz (1 kg)   Hemodynamic parameters for last 24 hours: CVP:  [13 mmHg-15 mmHg] 13 mmHg  Intake/Output from previous day: 12/14 0701 - 12/15 0700 In: 5011.6 [I.V.:2216.6; NG/GT:2645] Out: 6389 [Urine:4225; Stool:600; Chest Tube:440]  Intake/Output this shift: No intake/output data recorded.  Current Meds: Scheduled Meds: . acyclovir  200 mg Oral QID  . amiodarone  400 mg Per Tube BID  . aspirin EC  325 mg Oral Daily   Or  . aspirin  324 mg Per Tube Daily  . chlorhexidine gluconate (MEDLINE KIT)  15 mL Mouth Rinse BID  . feeding supplement (PRO-STAT SUGAR FREE 64)  30 mL Per Tube BID  . free water  200 mL Per Tube Q6H  . insulin aspart  0-24 Units Subcutaneous Q4H  . insulin glargine  15 Units Subcutaneous BID  . mouth rinse  15 mL Mouth Rinse QID  . metroNIDAZOLE  500 mg Per Tube TID  . pantoprazole sodium  40 mg Per Tube Q24H  . potassium chloride  40 mEq Per Tube Q12H  . sodium chloride flush  3 mL Intravenous Q12H  . vancomycin  500 mg Per  Tube Q6H   Continuous Infusions: . sodium chloride 20 mL/hr at 10/28/16 0400  . sodium chloride 30 mL/hr at 11/05/16 1700  . dexmedetomidine 1.2 mcg/kg/hr (11/06/16 0700)  . feeding supplement (VITAL HIGH PROTEIN) 1,000 mL (11/06/16 0700)  . fentaNYL infusion INTRAVENOUS 275 mcg/hr (11/06/16 0700)  . furosemide (LASIX) infusion 10 mg/hr (11/06/16 0700)  . heparin 2,100 Units/hr (11/06/16 0700)  . phenylephrine (NEO-SYNEPHRINE) Adult infusion Stopped (11/03/16 1800)   PRN Meds:.acetaminophen (TYLENOL) oral liquid 160 mg/5 mL, Gerhardt's butt cream, ipratropium-albuterol, metoprolol, midazolam, morphine injection, ondansetron (ZOFRAN) IV, sodium chloride flush  General appearance: alert and no distress Heart: irregularly irregular rhythm Lungs: pretty clear anteriorly Abdomen: mod dist, soft, nontender Extremities: + edema Wound: unchanged except had some drainage from top portion of sternal incis yesterday, clean currently Hands and feet with ischemic and necrotic changes Lab Results: CBC: Recent Labs  11/05/16 0500 11/06/16 0638  WBC 9.9 8.4  HGB 9.8* 9.2*  HCT 32.5* 30.4*  PLT 321 309   BMET:  Recent Labs  11/04/16 0400 11/05/16 0500  NA 147* 147*  K 3.7 3.5  CL 109 107  CO2 31 33*  GLUCOSE 160* 150*  BUN 39* 44*  CREATININE 0.78 1.00  CALCIUM 8.1* 7.8*    CMET: Lab Results  Component Value Date   WBC 8.4 11/06/2016   HGB 9.2 (L) 11/06/2016   HCT 30.4 (L) 11/06/2016   PLT 309 11/06/2016   GLUCOSE 150 (H) 11/05/2016   CHOL 134 10/21/2016   TRIG 203 (H) 10/21/2016   HDL 28 (L) 10/21/2016   LDLCALC 65 10/21/2016   ALT 55 11/05/2016   AST 46 (H) 11/05/2016   NA 147 (H) 11/05/2016   K 3.5 11/05/2016   CL 107 11/05/2016   CREATININE 1.00 11/05/2016   BUN 44 (H) 11/05/2016   CO2 33 (H) 11/05/2016   TSH 1.93 10/19/2016   INR 1.88 11/04/2016   HGBA1C 6.6 (H) 10/21/2016    PT/INR:  Recent Labs  11/04/16 1955  LABPROT 21.8*  INR 1.88   Radiology: No  results found.   Assessment/Plan:  1 stable 2 VDRF- will need trach for vent wean - CCM managing pulm issues 3 hemodyn stable with rate controlled afib.  Co-OX 63, heparin being managed per pharmacy 4 febrile, with cdiff, no leukocytosis, ID assisting with management. Necrotic wounds  Are not significantly changed but will require eventual amputatiions- Ortho consulted. Certainly pulm infection could also be possible as a  Contributing to fevers with atx/inflammation versus infiltrate on CXR 5 getting free H2O for hypernatremia, AHF team managing lasix gtt 6 anemia fairly stable- multifactorial 7 tol TF's 8 sugars adeq controlled    GOLD,WAYNE E 11/06/2016 7:52 AM    I have seen and examined the patient and agree with the assessment and plan as outlined.  Overall slowly improving.  Still having intermittent low grade fevers but leukocytosis has resolved.  Abdominal exam benign.  Remainder of exam stable.  Making some progress w/ vent weaning.  We tentatively plan tracheostomy on Monday 12/18 unless it appears that patient might do well with a trial extubation.  Increase potassium and free water replacement.  Rexene Alberts, MD 11/06/2016 9:06 AM

## 2016-11-06 NOTE — Progress Notes (Signed)
Attempted to wean pt on CP/PS 5/5. Low RR and tidal volumes, Increased PS to 12, tidal volumes increased but RR was still not sufficient. RN turned down sedation and RT will try to wean again.

## 2016-11-06 NOTE — Progress Notes (Signed)
ANTICOAGULATION CONSULT NOTE - Follow Up Consult  Pharmacy Consult for heparin Indication: atrial fibrillation and possible clot in RA   Labs:  Recent Labs  11/03/16 0449 11/04/16 0400  11/04/16 1955 11/05/16 0500 11/05/16 1458 11/06/16 0043  HGB 10.1* 10.1*  --   --  9.8*  --   --   HCT 33.8* 33.5*  --   --  32.5*  --   --   PLT 244 279  --   --  321  --   --   APTT 75* 73*  --   --   --   --   --   LABPROT 27.7*  --   --  21.8*  --   --   --   INR 2.52  --   --  1.88  --   --   --   HEPARINUNFRC  --   --   < > <0.10* 0.21* 0.24* 0.62  CREATININE 0.76 0.78  --   --  1.00  --   --   < > = values in this interval not displayed.   Assessment/Plan:  70yo male therapeutic on heparin after rate increase. Will continue gtt at current rate and confirm stable with additional level.   Wynona Neat, PharmD, BCPS  11/06/2016,1:27 AM

## 2016-11-06 NOTE — Progress Notes (Signed)
Patient ID: Scott [name], male   DOB: 05/14/1946, 70 y.o.   MRN: [id number] EVENING ROUNDS NOTE :     [address]       [place] [address]             [phone]                 11 Days Post-Op Procedure(s) (LRB): STERNAL WASHOUT AND DELAYED PRIMARY CLOSURE (N/A) TRANSESOPHAGEAL ECHOCARDIOGRAM (TEE) (N/A)  Total Length of Stay:  LOS: 17 days  BP 103/81   Pulse (!) 111   Temp 100.2 F (37.9 C)   Resp (!) 21   Ht 5\' 9"  (1.753 m)   Wt 258 lb 2.5 oz (117.1 kg)   SpO2 96%   BMI 38.12 kg/m   .Intake/Output      12/14 0701 - 12/15 0700 12/15 0701 - 12/16 0700   I.V. (mL/kg) 2636.6 (22.5) 1141.5 (9.7)   Other 150    NG/GT 2645 1080   IV Piggyback  265   Total Intake(mL/kg) 5431.6 (46.4) 2486.5 (21.2)   Urine (mL/kg/hr) 4225 (1.5) 2130 (1.7)   Emesis/NG output 0 (0)    Stool 600 (0.2) 300 (0.2)   Chest Tube 440 (0.2) 180 (0.1)   Total Output 5265 2610   Net +166.6 -123.5          . sodium chloride 20 mL/hr at 10/28/16 0400  . sodium chloride 30 mL/hr at 11/05/16 1700  . dexmedetomidine 1.2 mcg/kg/hr (11/06/16 1700)  . feeding supplement (VITAL HIGH PROTEIN) 1,000 mL (11/06/16 1700)  . fentaNYL infusion INTRAVENOUS 200 mcg/hr (11/06/16 1700)  . furosemide (LASIX) infusion 10 mg/hr (11/06/16 1700)  . heparin 1,950 Units/hr (11/06/16 1709)  . phenylephrine (NEO-SYNEPHRINE) Adult infusion Stopped (11/03/16 1800)     Lab Results  Component Value Date   WBC 8.4 11/06/2016   HGB 9.2 (L) 11/06/2016   HCT 30.4 (L) 11/06/2016   PLT 309 11/06/2016   GLUCOSE 105 (H) 11/06/2016   CHOL 134 10/21/2016   TRIG 203 (H) 10/21/2016   HDL 28 (L) 10/21/2016   LDLCALC 65 10/21/2016   ALT 55 11/05/2016   AST 46 (H) 11/05/2016   NA 148 (H) 11/06/2016   K 3.1 (L) 11/06/2016   CL 109 11/06/2016   CREATININE 0.93 11/06/2016   BUN 41 (H) 11/06/2016   CO2 33 (H) 11/06/2016   TSH 1.93 10/19/2016   INR 1.88 11/04/2016   HGBA1C 6.6 (H) 10/21/2016   Remains on  vent, more alert but not ready to extubate  [name] MD  Beeper [phone] Office [phone] 11/06/2016 5:28 PM

## 2016-11-06 NOTE — Progress Notes (Signed)
Patient ID: Scott [name], male   DOB: 12-31-1945, 70 y.o.   MRN: [id number]     Advanced Heart Failure Rounding Note  Referring Physician: Dr Roxy Manns Primary Physician: Dr Quintin Alto Primary Cardiologist:  Dr Percival Spanish   Reason for Consultation: Heart Failure   Subjective:    Admitted with chest pain. RHC//LHC with 3 vessel disease. Taken to the OR 11/30 for cabg x 3, repair of thoracic ascending aneurysm, AVR bioprosthetic,  MVR, and open chest with VAC placement.    Chest successfully closed 10/26/16.  Off milrinone and phenylephrine now.      CVP 12-13 this morning. Currently on lasix 10 mg/hr, good UOP, I/Os measured essentially even.  Remains intubated with sedation but able to follow commands.     HR in 80s on po amiodarone, in atrial flutter.   Suspect DIC and not HIT.  Now back on heparin gtt with RA clot and atrial fibrillation. HIT Ab and SRA negative.  Ongoing fevers. ID following.  May be from gangrenous digits. Now off IV vancomycin, anidulafungin and meropenem. C difficile came back positive.  This likely plays a role but fever pre-dates.  He is now on po vancomycin and Flagyl.   TEE 10/26/16 EF 40-45%, small cavity size, flattened/hypokinetic septum, s/p MV repair with mild MR, mild to moderately decreased RV systolic function. + RA thrombus.   Limited echo 10/29/16 LVEF 60-65%, unable to comment on AVR structure or function with poor windows.   Upper and lower extremity doppler studies: No DVTs  ABIs (12/8): Unable to doppler PTs, or get TBIs.   Objective:   Weight Range: 258 lb 2.5 oz (117.1 kg) Body mass index is 38.12 kg/m.   Vital Signs:   Temp:  [98.1 F (36.7 C)-101.8 F (38.8 C)] 98.2 F (36.8 C) (12/15 1100) Pulse Rate:  [76-102] 87 (12/15 1100) Resp:  [12-25] 16 (12/15 1100) BP: (83-129)/(56-99) 96/79 (12/15 1000) SpO2:  [89 %-100 %] 98 % (12/15 1100) FiO2 (%):  [40 %-50 %] 40 % (12/15 0915) Weight:  [258 lb 2.5 oz (117.1 kg)] 258 lb 2.5 oz (117.1 kg)  (12/15 0500) Last BM Date: 11/06/16  Weight change: Filed Weights   11/04/16 0315 11/05/16 0500 11/06/16 0500  Weight: 255 lb 4.7 oz (115.8 kg) 255 lb 15.3 oz (116.1 kg) 258 lb 2.5 oz (117.1 kg)    Intake/Output:   Intake/Output Summary (Last 24 hours) at 11/06/16 1218 Last data filed at 11/06/16 1200  Gross per 24 hour  Intake          4878.07 ml  Output             5620 ml  Net          -741.93 ml     Physical Exam: CVP 12-13 General: Remains intubated. Marland Kitchen  HEENT: + ETT.  Neck: supple. JVP 12. Carotids 2+ bilat; no bruits. No thyromegaly or nodule noted.   Cor: PMI nondisplaced. Mildly tachy, regular.  No M/G/R noted. Chest tubes in place.  Lungs: Scattered rhonchi, Mechanical breathing sounds present.   Abdomen: soft, ND, no HSM. No bruits or masses. +BS  Extremities: no clubbing, rash. 1+ ankle edema. Digital gangrene. Toes remain mottled, some improvement. Unable to doppler pedal pulses.  Neuro: Intubated. Awake follows commands.   GU: Foley in place  Telemetry: Reviewed, Currently afib-flutter 70-80s  Labs: CBC  Recent Labs  11/05/16 0500 11/06/16 0638  WBC 9.9 8.4  HGB 9.8* 9.2*  HCT 32.5* 30.4*  MCV 95.3  95.3  PLT 321 161   Basic Metabolic Panel  Recent Labs  11/05/16 0500 11/06/16 0930  NA 147* 148*  K 3.5 3.1*  CL 107 109  CO2 33* 33*  GLUCOSE 150* 105*  BUN 44* 41*  CREATININE 1.00 0.93  CALCIUM 7.8* 7.4*   Liver Function Tests  Recent Labs  11/05/16 0500  AST 46*  ALT 55  ALKPHOS 111  BILITOT 1.0  PROT 5.0*  ALBUMIN 1.9*   No results for input(s): LIPASE, AMYLASE in the last 72 hours. Cardiac Enzymes No results for input(s): CKTOTAL, CKMB, CKMBINDEX, TROPONINI in the last 72 hours.  BNP: BNP (last 3 results) No results for input(s): BNP in the last 8760 hours.  ProBNP (last 3 results) No results for input(s): PROBNP in the last 8760 hours.   D-Dimer No results for input(s): DDIMER in the last 72 hours. Hemoglobin  A1C No results for input(s): HGBA1C in the last 72 hours. Fasting Lipid Panel No results for input(s): CHOL, HDL, LDLCALC, TRIG, CHOLHDL, LDLDIRECT in the last 72 hours. Thyroid Function Tests No results for input(s): TSH, T4TOTAL, T3FREE, THYROIDAB in the last 72 hours.  Invalid input(s): FREET3  Other results:  Imaging/Studies:  Dg Chest Port 1 View  Result Date: 11/05/2016 CLINICAL DATA:  Respiratory failure. EXAM: PORTABLE CHEST 1 VIEW COMPARISON:  Radiograph of November 04, 2016. FINDINGS: Stable cardiomegaly. Bilateral chest tubes are unchanged in position without evidence of pneumothorax. Endotracheal and nasogastric tubes are unchanged in position. Stable position of left subclavian catheter with distal tip in expected position of SVC. Status post coronary bypass graft. Stable bibasilar opacities, left greater than right consistent with atelectasis or possibly inflammation. Bony thorax is unremarkable. IMPRESSION: Stable support apparatus. Bilateral chest tubes are again noted without pneumothorax. Stable bibasilar opacities, left greater than right, as described above. Electronically Signed   By: Marijo Conception, M.D.   On: 11/05/2016 07:44   Dg Chest Port 1 View  Result Date: 11/04/2016 CLINICAL DATA:  70 year old male status post cardiac surgery, with repair of ascending thoracic aneurysm, aortic valve replacement for bicuspid valve, mitral valve annuloplasty, and coronary artery bypass grafting 10/22/2016. Delayed primary closure 10/26/2016 EXAM: PORTABLE CHEST 1 VIEW COMPARISON:  11/04/2016 FINDINGS: Cardiomediastinal silhouette unchanged. Surgical changes of median sternotomy, aortic valve replacement, mitral valve annuloplasty, and CABG. Unchanged position of endotracheal tube, terminating 3.8 cm above the carina. Left upper extremity PICC unchanged, terminating superior vena cava. Unchanged position of left and right large bore thoracostomy tubes. Additional mediastinal/ pleural  drains and epicardial pacing leads not visualized, although this may be related to technique. Similar appearance of basilar opacities at the lung bases obscuring the hemidiaphragm and the right heart border. No pneumothorax. Similar appearance of mixed interstitial opacities of the bilateral lungs. No interlobular septal thickening. IMPRESSION: Similar appearance of the lungs, with persisting basilar opacities, potentially combination of atelectasis/ consolidation. No evidence of significant edema or pneumothorax. Unchanged endotracheal tube, bilateral thoracostomy tubes, and left upper extremity PICC. Surgical changes of median sternotomy, aortic valve repair/ ascending aortic replacement, mitral valve annuloplasty, and CABG. Signed, Dulcy Fanny. Earleen Newport, DO [facility] Electronically Signed   By: Corrie Mckusick D.O.   On: 11/04/2016 20:36    Medications:     Scheduled Medications: . acyclovir  200 mg Oral QID  . amiodarone  400 mg Per Tube BID  . aspirin EC  325 mg Oral Daily   Or  . aspirin  324 mg Per  Tube Daily  . chlorhexidine gluconate (MEDLINE KIT)  15 mL Mouth Rinse BID  . famotidine  20 mg Per Tube Q12H  . feeding supplement (PRO-STAT SUGAR FREE 64)  30 mL Per Tube BID  . free water  300 mL Per Tube Q6H  . insulin aspart  0-24 Units Subcutaneous Q4H  . insulin glargine  15 Units Subcutaneous BID  . mouth rinse  15 mL Mouth Rinse QID  . metroNIDAZOLE  500 mg Per Tube TID  . potassium chloride  40 mEq Per Tube Q6H  . potassium chloride (KCL MULTIRUN) 30 mEq in 265 mL IVPB  30 mEq Intravenous Once  . sodium chloride flush  3 mL Intravenous Q12H  . vancomycin  500 mg Per Tube Q6H    Infusions: . sodium chloride 20 mL/hr at 10/28/16 0400  . sodium chloride 30 mL/hr at 11/05/16 1700  . dexmedetomidine 1.2 mcg/kg/hr (11/06/16 1153)  . feeding supplement (VITAL HIGH PROTEIN) 1,000 mL (11/06/16 1100)  . fentaNYL infusion INTRAVENOUS  275 mcg/hr (11/06/16 1100)  . furosemide (LASIX) infusion 10 mg/hr (11/06/16 1100)  . heparin 2,100 Units/hr (11/06/16 1100)  . phenylephrine (NEO-SYNEPHRINE) Adult infusion Stopped (11/03/16 1800)    PRN Medications: acetaminophen (TYLENOL) oral liquid 160 mg/5 mL, Gerhardt's butt cream, ipratropium-albuterol, metoprolol, midazolam, morphine injection, ondansetron (ZOFRAN) IV, sodium chloride flush   Assessment/Plan   Mr Boateng is 70 year old admitted with CP. Complex operation this admission.  CABG x 3 + bioprosthetic AVR for severe bicuspid AS + ascending aorta replacement + MV repair for flail P2.  MV repair complicated by severe SAM, ended up having to remove posterior annuloplasty band.  Chest closed on 12/3.   1. CAD:  10/22/16 S/P CABG x3 with chest left open.  Chest successfully closed am of 10/26/16. - Continue ASA, start statin eventually now that LFTs down.  2. Severe Aortic Stenosis: 11/302017 S/P AVR bioprostheic  3. Mitral regurgitation: Partial flail leaflet with MV repair.  Developed SAM initially after repair and posterior annuloplasty ring had to be removed.  SAM resolved. 4. Acute on chronic systolic CHF:  TEE at time of chest closure showed EF 40-45%, small cavity size, flattened/hypokinetic septum, s/p MV repair with mild MR, mild to moderately decreased RV systolic function, thrombus in RA. Repeat echo 12/7 with EF 60-65%.  He is now off milrinone and phenylephrine.  CVP today is 14-15.  He is diuresing well with Lasix gtt at 10 mg/hr but gets a significant amount of fluid in => I/Os remain roughly even.  - CVP 12-13 elevated and with the amount of fluid he gets in from meds, would continue Lasix gtt today at 10 mg/hr.  5. ID: ?Source of fever => ID following, do not think endocarditis.  Concerned digital gangrene is a contributor.  Now off meropenem, IV vancomycin, and anidulafungin. C difficile has been identified and is playing a role.  6. Elevated LFTs: Most likely  shock liver.  LFTs now trended down.  Transaminitis likely due to shock liver, amiodarone restarted.  - No statin for now with elevated LFTs, will need eventually.  - Abdominal US 10/29/16 with Cholelithiasis without sonographic evidence for acute cholecystitis. No biliary dilatation. Increased hepatic echogenicity, suggestive of steatosis. and Ascites.   7. Heme: HIT negative, suspect DIC. Platelet count has recovered.  - Now back on heparin gtt for RA thrombus and atrial fibrillation.  - Will need transition to warfarin eventually.  8. VDRF: CCM following for vent management. Pulmonary edema present, ?  PNA. He will likely need tracheostomy.  Possible trach Monday.  9. Atrial fibrillation/flutter: Rate controlled. Appears to be in flutter today.    - Continue heparin gtt.  Eventually will go on warfarin.  - Continue amio 400 mg twice a day.  10. PAD: Finger gangrene, mottled toes.  Unable to doppler PT pulses or get TBIs.  May be source of fever.  Seen by Dr Sharol Given, eventually will need amputation of most digits at PIP joint.   Darrick Grinder, NP 11/06/2016 12:18 PM  Advanced Heart Failure Team Pager [phone] (M-F; 7a - 4p)  Please contact [facility] for night-coverage after hours (4p -7a ) and weekends on [email or web address]  Patient seen with NP, agree with the above note.  No major changes today.  Would continue current IV Lasix to keep at least slightly negative.  Still with fever overnight.  Possible trach on Monday.   Loralie Champagne 11/06/2016

## 2016-11-06 NOTE — Progress Notes (Signed)
PCCM Progress Note  ADMISSION DATE:  10/20/2016 CONSULTATION DATE: 10/23/2016 REFERRING MD: Ricard Dillon  CHIEF COMPLAINT:  Chest pain  HISTORY OF PRESENT ILLNESS:   70 yo former smoker with known CAD and ascending aorta aneurysm, who was taken to OR 11/30 for cabg x 3, aortic root replacement, MVR replacement, thoracic cavity left open and had extended pump time in OR.  SUBJECTIVE:  Remains on lasix gtt.  VITAL SIGNS: BP 96/79   Pulse 91   Temp 98.1 F (36.7 C)   Resp 15   Ht 5\' 9"  (1.753 m)   Wt 258 lb 2.5 oz (117.1 kg)   SpO2 100%   BMI 38.12 kg/m   HEMODYNAMICS: CVP:  [13 mmHg-15 mmHg] 13 mmHg  INTAKE / OUTPUT: I/O last 3 completed shifts: In: 8127.9 [I.V.:4147.9; Other:150; YG:[id number]; IV Piggyback:265] Out: [id number] [Urine:5890; Stool:750; Chest Tube:720]  PHYSICAL EXAMINATION: General: sedated Neuro: RASS -3 HEENT:  ETT in place Cardiovascular: irregular, chest closed Lungs:  No wheeze Abdomen:  Soft, non tender Ext: 1+ edema, extremities cool Skin: Ischemic changes in fingers b/l  CMP Latest Ref Rng & Units 11/06/2016 11/05/2016 11/04/2016  Glucose 65 - 99 mg/dL 105(H) 150(H) 160(H)  BUN 6 - 20 mg/dL 41(H) 44(H) 39(H)  Creatinine 0.61 - 1.24 mg/dL 0.93 1.00 0.78  Sodium 135 - 145 mmol/L 148(H) 147(H) 147(H)  Potassium 3.5 - 5.1 mmol/L 3.1(L) 3.5 3.7  Chloride 101 - 111 mmol/L 109 107 109  CO2 22 - 32 mmol/L 33(H) 33(H) 31  Calcium 8.9 - 10.3 mg/dL 7.4(L) 7.8(L) 8.1(L)  Total Protein 6.5 - 8.1 g/dL - 5.0(L) -  Total Bilirubin 0.3 - 1.2 mg/dL - 1.0 -  Alkaline Phos 38 - 126 U/L - 111 -  AST 15 - 41 U/L - 46(H) -  ALT 17 - 63 U/L - 55 -    CBC Latest Ref Rng & Units 11/06/2016 11/05/2016 11/04/2016  WBC 4.0 - 10.5 K/uL 8.4 9.9 12.5(H)  Hemoglobin 13.0 - 17.0 g/dL 9.2(L) 9.8(L) 10.1(L)  Hematocrit 39.0 - 52.0 % 30.4(L) 32.5(L) 33.5(L)  Platelets 150 - 400 K/uL 309 321 279    ABG    Component Value Date/Time   PHART 7.511 (H) 11/04/2016 1950   PCO2ART  43.5 11/04/2016 1950   PO2ART 274.0 (H) 11/04/2016 1950   HCO3 34.4 (H) 11/04/2016 1950   TCO2 36 11/04/2016 1950   ACIDBASEDEF 1.0 10/27/2016 1125   O2SAT 63.2 11/06/2016 0410    CBG (last 3)   Recent Labs  11/05/16 2341 11/06/16 0359 11/06/16 0746  GLUCAP 153* 94 97    Imaging: Dg Chest Port 1 View  Result Date: 11/05/2016 CLINICAL DATA:  Respiratory failure. EXAM: PORTABLE CHEST 1 VIEW COMPARISON:  Radiograph of November 04, 2016. FINDINGS: Stable cardiomegaly. Bilateral chest tubes are unchanged in position without evidence of pneumothorax. Endotracheal and nasogastric tubes are unchanged in position. Stable position of left subclavian catheter with distal tip in expected position of SVC. Status post coronary bypass graft. Stable bibasilar opacities, left greater than right consistent with atelectasis or possibly inflammation. Bony thorax is unremarkable. IMPRESSION: Stable support apparatus. Bilateral chest tubes are again noted without pneumothorax. Stable bibasilar opacities, left greater than right, as described above. Electronically Signed   By: Marijo Conception, M.D.   On: 11/05/2016 07:44   Dg Chest Port 1 View  Result Date: 11/04/2016 CLINICAL DATA:  70 year old male status post cardiac surgery, with repair of ascending thoracic aneurysm, aortic valve replacement for bicuspid valve, mitral valve  annuloplasty, and coronary artery bypass grafting 10/22/2016. Delayed primary closure 10/26/2016 EXAM: PORTABLE CHEST 1 VIEW COMPARISON:  11/04/2016 FINDINGS: Cardiomediastinal silhouette unchanged. Surgical changes of median sternotomy, aortic valve replacement, mitral valve annuloplasty, and CABG. Unchanged position of endotracheal tube, terminating 3.8 cm above the carina. Left upper extremity PICC unchanged, terminating superior vena cava. Unchanged position of left and right large bore thoracostomy tubes. Additional mediastinal/ pleural drains and epicardial pacing leads not  visualized, although this may be related to technique. Similar appearance of basilar opacities at the lung bases obscuring the hemidiaphragm and the right heart border. No pneumothorax. Similar appearance of mixed interstitial opacities of the bilateral lungs. No interlobular septal thickening. IMPRESSION: Similar appearance of the lungs, with persisting basilar opacities, potentially combination of atelectasis/ consolidation. No evidence of significant edema or pneumothorax. Unchanged endotracheal tube, bilateral thoracostomy tubes, and left upper extremity PICC. Surgical changes of median sternotomy, aortic valve repair/ ascending aortic replacement, mitral valve annuloplasty, and CABG. Signed, Dulcy Fanny. Earleen Newport, DO [facility] Electronically Signed   By: Corrie Mckusick D.O.   On: 11/04/2016 20:36    STUDIES:  12.5 dopplers lowers>>>neg 12/6 dopplers arms >>>neg 12/7 abdo us>>>Cholelithiasis without sonographic evidence for acute cholecystitis. No biliary dilatation.Increased hepatic echogenicity, suggestive of steatosis. Ascites. 12/7 echo>>>60-65%, rv poor images but appeared wnl  CULTURES: C diff 12/12 >> positive Blood 12/12 >> Rt thigh wound 12/12 >>   ANTIBIOTICS:  Acyclovir 12/10 >> PO vancomycin 12/12 >> Flagyl 12/13 >>  SIGNIFICANT EVENTS: 11/30 surgery, hypoxia 12/4 closed chest 12/7 high fevers, LFt up  LINES/TUBES: 11/30 ETT>> 12/04 picc left >>> 11/30 CT x 4>>  ASSESSMENT / PLAN:  Acute hypoxic respiratory failure after CABG with MVR. - pressure support wean as tolerated - tentative plan for trach 12/18 with TCTS - f/u CXR  CAD s/p CABG, MVR, aortic root replacement. A fib. Rt atrial clot. - amiodarone, ASA - lasix gtt per TCTS  B/l hand/feet ischemia. - will need further therapy when more stable  C diff colitis. - flagyl, enteral vancomycin per ID  Anemia of critical illness. - f/u CBC  DM. -  SSI with lantus  Acute metabolic encephalopathy. - RASS goal 0 to -1 - continue precedex  DVT prophylaxis - heparin gtt SUP - Pepcid Nutrition - tubes feeds Goals of care - Full code  Resolved problems >> DIC  CC time 33 minutes.  Chesley Mires, MD [facility] Pulmonary/Critical Care 11/06/2016, 11:27 AM Pager:  [phone] After 3pm call: [phone]

## 2016-11-06 NOTE — Progress Notes (Signed)
Paged Phlebotomy to notify that patient would require a lab draw for his Heparin level.

## 2016-11-06 NOTE — Progress Notes (Signed)
ANTICOAGULATION CONSULT NOTE - Follow Up Consult  Pharmacy Consult for Heparin Indication: possible clot in RA and afib   Allergies  Allergen Reactions  . Doxycycline Hives  . Penicillins Hives     Has patient had a PCN reaction causing immediate rash, facial/tongue/throat swelling, SOB or lightheadedness with hypotension: No Has patient had a PCN reaction causing severe rash involving mucus membranes or skin necrosis: No Has patient had a PCN reaction that required hospitalization: No Has patient had a PCN reaction occurring within the last 10 years:# # # YES # # #  If all of the above answers are "NO", then may proceed with Cephalosporin use.   . Sulfa Antibiotics Hives    Patient Measurements: Height: 5\' 9"  (175.3 cm) Weight: 258 lb 2.5 oz (117.1 kg) IBW/kg (Calculated) : 70.7  Heparin dosing wt: 97 kg  Vital Signs: Temp: 98.8 F (37.1 C) (12/15 1231) Temp Source: Core (Comment) (12/15 0900) BP: 80/59 (12/15 1317) Pulse Rate: 90 (12/15 1317)  Labs:  Recent Labs  11/04/16 0400  11/04/16 1955 11/05/16 0500  11/06/16 0043 11/06/16 0638 11/06/16 0930 11/06/16 0939  HGB 10.1*  --   --  9.8*  --   --  9.2*  --   --   HCT 33.5*  --   --  32.5*  --   --  30.4*  --   --   PLT 279  --   --  321  --   --  309  --   --   APTT 73*  --   --   --   --   --   --   --   --   LABPROT  --   --  21.8*  --   --   --   --   --   --   INR  --   --  1.88  --   --   --   --   --   --   HEPARINUNFRC  --   < > <0.10* 0.21*  < > 0.62 2.00*  --  0.80*  CREATININE 0.78  --   --  1.00  --   --   --  0.93  --   < > = values in this interval not displayed.  Estimated Creatinine Clearance: 93.4 mL/min (by C-G formula based on SCr of 0.93 mg/dL).  Assessment: 70yom on heparin for possible right atrial clot and afib. HIT ruled out (low heparin antibody and SRA negative).  Heparin level supratherapeutic 0.8 - drawn correctly from peripheral stick - HL 2 this am drawn from CL - error as that is  same line heparin is infuiing into.    No issues with line or bleeding reported per RN.  Goal of Therapy:  Heparin level goal 0.3-0.7 units/ml Monitor platelets by anticoagulation protocol: Yes   Plan:  Hold heparin x1 hr  Restart heparin drip 1950 units / hr Will f/u 6hr heparin level Daily HL - drawn from peripheral stick, CBC  Bonnita Nasuti Pharm.D. CPP, BCPS Clinical Pharmacist [phone] 11/06/2016 2:29 PM

## 2016-11-06 NOTE — Progress Notes (Signed)
INFECTIOUS DISEASE PROGRESS NOTE  ID: Scott [name] is a 70 y.o. male with  Principal Problem:   S/P aortic valve replacement + mitral valve repair + CABG x3 + repair of ascending thoracic aortic aneurysm Active Problems:   Severe aortic stenosis by prior echocardiography   Unstable angina (HCC)   Severe aortic stenosis   Coronary artery disease involving native coronary artery of native heart with unstable angina pectoris (HCC)   Bicuspid aortic valve   Thoracic ascending aortic aneurysm (HCC)   Mitral regurgitation due to cusp prolapse   S/P ascending aortic aneurysm repair   S/P mitral valve repair   Hx of CABG   Cardiogenic shock (HCC)   Postoperative atrial fibrillation (HCC)   Thrombocytopenia (HCC)   Acute combined systolic and diastolic congestive heart failure (HCC)   Acute respiratory failure (HCC)   DIC (disseminated intravascular coagulation) (HCC)   Elevated LFTs   Acute encephalopathy   Fever   Enteritis due to Clostridium difficile   Anasarca   Atherosclerosis of native arteries of extremities with gangrene, left leg (HCC)   Atherosclerosis of native arteries of extremities with gangrene, right leg (HCC)   Acute on chronic diastolic CHF (congestive heart failure) (HCC)  Subjective: Awake today, nodding no when asked about pain. Continues to have fevers Tmax 101.8 overnight, BP remains soft.   Abtx:  Anti-infectives    Start     Dose/Rate Route Frequency Ordered Stop   11/04/16 0845  metroNIDAZOLE (FLAGYL) 50 mg/ml oral suspension 500 mg     500 mg Per Tube 3 times daily 11/04/16 0831     11/03/16 2000  vancomycin (VANCOCIN) 50 mg/mL oral solution 500 mg     500 mg Per Tube Every 6 hours 11/03/16 1812 11/17/16 1759   11/01/16 1400  acyclovir (ZOVIRAX) 200 MG/5ML suspension SUSP 200 mg     200 mg Oral 4 times daily 11/01/16 1200     10/30/16 0927  anidulafungin (ERAXIS) 100 mg in sodium chloride 0.9 % 100 mL IVPB  Status:  Discontinued     100 mg over  90 Minutes Intravenous Every 24 hours 10/29/16 0927 10/31/16 1430   10/29/16 1000  meropenem (MERREM) 1 g in sodium chloride 0.9 % 100 mL IVPB  Status:  Discontinued     1 g 200 mL/hr over 30 Minutes Intravenous Every 8 hours 10/29/16 0931 10/31/16 1430   10/29/16 0930  anidulafungin (ERAXIS) 200 mg in sodium chloride 0.9 % 200 mL IVPB     200 mg over 180 Minutes Intravenous  Once 10/29/16 0927 10/29/16 1349   10/29/16 0600  vancomycin (VANCOCIN) 1,250 mg in sodium chloride 0.9 % 250 mL IVPB  Status:  Discontinued     1,250 mg 166.7 mL/hr over 90 Minutes Intravenous Every 24 hours 10/28/16 0900 11/02/16 0945   10/28/16 0600  vancomycin (VANCOCIN) IVPB 750 mg/150 ml premix  Status:  Discontinued     750 mg 150 mL/hr over 60 Minutes Intravenous Every 12 hours 10/27/16 1226 10/28/16 0900   10/27/16 1400  cefTRIAXone (ROCEPHIN) 2 g in dextrose 5 % 50 mL IVPB  Status:  Discontinued     2 g 100 mL/hr over 30 Minutes Intravenous Every 24 hours 10/27/16 1203 10/29/16 0907   10/27/16 1400  rifampin (RIFADIN) 300 mg in sodium chloride 0.9 % 100 mL IVPB  Status:  Discontinued     300 mg 200 mL/hr over 30 Minutes Intravenous Every 8 hours 10/27/16 1226 10/27/16 1426  10/27/16 1330  vancomycin (VANCOCIN) 2,000 mg in sodium chloride 0.9 % 500 mL IVPB     2,000 mg 250 mL/hr over 120 Minutes Intravenous  Once 10/27/16 1226 10/27/16 1451   10/27/16 1000  levofloxacin (LEVAQUIN) IVPB 750 mg  Status:  Discontinued     750 mg 100 mL/hr over 90 Minutes Intravenous Every 24 hours 10/26/16 1054 10/27/16 1203   10/26/16 1815  vancomycin (VANCOCIN) IVPB 1000 mg/200 mL premix     1,000 mg 200 mL/hr over 60 Minutes Intravenous  Once 10/26/16 1054 10/26/16 1752   10/26/16 0954  polymyxin B 500,000 Units, bacitracin 50,000 Units in sodium chloride irrigation 0.9 % 500 mL irrigation  Status:  Discontinued       As needed 10/26/16 0954 10/26/16 1030   10/26/16 0400  vancomycin (VANCOCIN) 1,500 mg in sodium chloride  0.9 % 250 mL IVPB     1,500 mg 125 mL/hr over 120 Minutes Intravenous To Surgery 10/25/16 1145 10/26/16 0925   10/26/16 0400  vancomycin (VANCOCIN) 1,000 mg in sodium chloride 0.9 % 1,000 mL irrigation      Irrigation To Surgery 10/25/16 1145 10/26/16 0953   10/26/16 0400  levofloxacin (LEVAQUIN) IVPB 500 mg     500 mg 100 mL/hr over 60 Minutes Intravenous To Surgery 10/25/16 1145 10/26/16 0925   10/23/16 1000  levofloxacin (LEVAQUIN) IVPB 750 mg     750 mg 100 mL/hr over 90 Minutes Intravenous Every 24 hours 10/22/16 2130 10/23/16 1047   10/23/16 0515  vancomycin (VANCOCIN) IVPB 1000 mg/200 mL premix     1,000 mg 200 mL/hr over 60 Minutes Intravenous  Once 10/22/16 2130 10/23/16 0600   10/22/16 2045  vancomycin (VANCOCIN) 1,250 mg in sodium chloride 0.9 % 250 mL IVPB  Status:  Discontinued     1,250 mg 166.7 mL/hr over 90 Minutes Intravenous To Surgery 10/22/16 2043 10/22/16 2130   10/22/16 2045  levofloxacin (LEVAQUIN) IVPB 500 mg  Status:  Discontinued     500 mg 100 mL/hr over 60 Minutes Intravenous To Surgery 10/22/16 2043 10/22/16 2130   10/22/16 0400  vancomycin (VANCOCIN) 1,250 mg in sodium chloride 0.9 % 250 mL IVPB     1,250 mg 166.7 mL/hr over 90 Minutes Intravenous To Surgery 10/21/16 1305 10/22/16 2247   10/22/16 0400  vancomycin (VANCOCIN) 1,000 mg in sodium chloride 0.9 % 1,000 mL irrigation      Irrigation To Surgery 10/21/16 1305 10/22/16 0819   10/22/16 0400  levofloxacin (LEVAQUIN) IVPB 500 mg     500 mg 100 mL/hr over 60 Minutes Intravenous To Surgery 10/21/16 1305 10/22/16 2114      Medications:  Scheduled: . acyclovir  200 mg Oral QID  . amiodarone  400 mg Per Tube BID  . aspirin EC  325 mg Oral Daily   Or  . aspirin  324 mg Per Tube Daily  . chlorhexidine gluconate (MEDLINE KIT)  15 mL Mouth Rinse BID  . feeding supplement (PRO-STAT SUGAR FREE 64)  30 mL Per Tube BID  . free water  200 mL Per Tube Q6H  . insulin aspart  0-24 Units Subcutaneous Q4H  .  insulin glargine  15 Units Subcutaneous BID  . mouth rinse  15 mL Mouth Rinse QID  . metroNIDAZOLE  500 mg Per Tube TID  . pantoprazole sodium  40 mg Per Tube Q24H  . potassium chloride  40 mEq Per Tube Q12H  . sodium chloride flush  3 mL Intravenous Q12H  . vancomycin  500 mg Per Tube Q6H    Objective: Vital signs in last 24 hours: Temp:  [98.6 F (37 C)-101.8 F (38.8 C)] 98.6 F (37 C) (12/15 0748) Pulse Rate:  [76-115] 79 (12/15 0600) Resp:  [12-25] 12 (12/15 0600) BP: (83-129)/(56-99) 88/73 (12/15 0600) SpO2:  [89 %-100 %] 99 % (12/15 0600) FiO2 (%):  [50 %] 50 % (12/15 0400) Weight:  [258 lb 2.5 oz (117.1 kg)] 258 lb 2.5 oz (117.1 kg) (12/15 0500)  Vitals reviewed. General: resting in bed, intubated, awake HEENT: PERRL, EOMI, no scleral icterus Cardiac: irregular, systolic murmur present  Pulm: coarse breath sounds to anterior field. No crackles.  Abd: soft, mild distension, no tenderness to touch. Chest: sternotomy site looks C/D/I. Has chest tubes. Ext: cold exts, dry gangrenous changes on distal portions of several fingers on both hands. Feet cold to touch. Has some bullous on both lower exts.  Neuro: awake, answers simple question by nodding. Moves exts equally when asked.   Lab Results  Recent Labs  11/04/16 0400 11/05/16 0500 11/06/16 0638  WBC 12.5* 9.9 8.4  HGB 10.1* 9.8* 9.2*  HCT 33.5* 32.5* 30.4*  NA 147* 147*  --   K 3.7 3.5  --   CL 109 107  --   CO2 31 33*  --   BUN 39* 44*  --   CREATININE 0.78 1.00  --    Liver Panel  Recent Labs  11/05/16 0500  PROT 5.0*  ALBUMIN 1.9*  AST 46*  ALT 55  ALKPHOS 111  BILITOT 1.0  BILIDIR 0.4  IBILI 0.6   Sedimentation Rate No results for input(s): ESRSEDRATE in the last 72 hours. C-Reactive Protein No results for input(s): CRP in the last 72 hours.  Microbiology: Recent Results (from the past 240 hour(s))  Culture, blood (Routine X 2) w Reflex to ID Panel     Status: None   Collection Time:  10/29/16  2:47 AM  Result Value Ref Range Status   Specimen Description BLOOD RIGHT HAND  Final   Special Requests IN PEDIATRIC BOTTLE [place]  Final   Culture NO GROWTH 5 DAYS  Final   Report Status 11/03/2016 FINAL  Final  Culture, blood (Routine X 2) w Reflex to ID Panel     Status: None   Collection Time: 10/29/16  5:51 AM  Result Value Ref Range Status   Specimen Description BLOOD RIGHT ARM  Final   Special Requests BOTTLES DRAWN AEROBIC AND ANAEROBIC 5CC   Final   Culture NO GROWTH 5 DAYS  Final   Report Status 11/03/2016 FINAL  Final  Culture, respiratory (NON-Expectorated)     Status: None   Collection Time: 10/29/16 10:21 AM  Result Value Ref Range Status   Specimen Description TRACHEAL ASPIRATE  Final   Special Requests NONE  Final   Gram Stain   Final    FEW WBC PRESENT, PREDOMINANTLY PMN MODERATE GRAM NEGATIVE RODS RARE GRAM POSITIVE COCCI    Culture Consistent with normal respiratory flora.  Final   Report Status 10/31/2016 FINAL  Final  Culture, respiratory (NON-Expectorated)     Status: None   Collection Time: 10/31/16 12:37 PM  Result Value Ref Range Status   Specimen Description TRACHEAL ASPIRATE  Final   Special Requests Normal  Final   Gram Stain   Final    RARE WBC PRESENT, PREDOMINANTLY MONONUCLEAR RARE GRAM NEGATIVE COCCOBACILLI    Culture RARE CANDIDA ALBICANS  Final   Report Status 11/03/2016 FINAL  Final  Culture, blood (single) w Reflex to ID Panel     Status: None   Collection Time: 10/31/16  2:08 PM  Result Value Ref Range Status   Specimen Description BLOOD RIGHT ANTECUBITAL  Final   Special Requests IN PEDIATRIC BOTTLE 3CC  Final   Culture NO GROWTH 5 DAYS  Final   Report Status 11/05/2016 FINAL  Final  Aerobic Culture (superficial specimen)     Status: None (Preliminary result)   Collection Time: 11/03/16  8:46 AM  Result Value Ref Range Status   Specimen Description WOUND RIGHT THIGH  Final   Special Requests NONE  Final   Gram Stain    Final    RARE WBC PRESENT, PREDOMINANTLY PMN NO ORGANISMS SEEN    Culture NO GROWTH 2 DAYS  Final   Report Status PENDING  Incomplete  Culture, blood (routine x 2)     Status: None (Preliminary result)   Collection Time: 11/03/16  5:50 PM  Result Value Ref Range Status   Specimen Description BLOOD RIGHT ANTECUBITAL  Final   Special Requests BOTTLES DRAWN AEROBIC AND ANAEROBIC 10ML EACH  Final   Culture NO GROWTH 2 DAYS  Final   Report Status PENDING  Incomplete  Culture, blood (routine x 2)     Status: None (Preliminary result)   Collection Time: 11/03/16  5:58 PM  Result Value Ref Range Status   Specimen Description BLOOD RIGHT ANTECUBITAL  Final   Special Requests BOTTLES DRAWN AEROBIC AND ANAEROBIC 10CC EACH  Final   Culture NO GROWTH 2 DAYS  Final   Report Status PENDING  Incomplete  C difficile quick scan w PCR reflex     Status: Abnormal   Collection Time: 11/03/16  6:21 PM  Result Value Ref Range Status   C Diff antigen POSITIVE (A) NEGATIVE Final   C Diff toxin POSITIVE (A) NEGATIVE Final   C Diff interpretation Toxin producing C. difficile detected.  Final    Comment: CRITICAL RESULT CALLED TO, READ BACK BY AND VERIFIED WITH: A.HARGETT,RN AT 2322 BY L.PITT 11/03/16     Studies/Results: Dg Chest Port 1 View  Result Date: 11/05/2016 CLINICAL DATA:  Respiratory failure. EXAM: PORTABLE CHEST 1 VIEW COMPARISON:  Radiograph of November 04, 2016. FINDINGS: Stable cardiomegaly. Bilateral chest tubes are unchanged in position without evidence of pneumothorax. Endotracheal and nasogastric tubes are unchanged in position. Stable position of left subclavian catheter with distal tip in expected position of SVC. Status post coronary bypass graft. Stable bibasilar opacities, left greater than right consistent with atelectasis or possibly inflammation. Bony thorax is unremarkable. IMPRESSION: Stable support apparatus. Bilateral chest tubes are again noted without pneumothorax. Stable  bibasilar opacities, left greater than right, as described above. Electronically Signed   By: Marijo Conception, M.D.   On: 11/05/2016 07:44   Dg Chest Port 1 View  Result Date: 11/04/2016 CLINICAL DATA:  70 year old male status post cardiac surgery, with repair of ascending thoracic aneurysm, aortic valve replacement for bicuspid valve, mitral valve annuloplasty, and coronary artery bypass grafting 10/22/2016. Delayed primary closure 10/26/2016 EXAM: PORTABLE CHEST 1 VIEW COMPARISON:  11/04/2016 FINDINGS: Cardiomediastinal silhouette unchanged. Surgical changes of median sternotomy, aortic valve replacement, mitral valve annuloplasty, and CABG. Unchanged position of endotracheal tube, terminating 3.8 cm above the carina. Left upper extremity PICC unchanged, terminating superior vena cava. Unchanged position of left and right large bore thoracostomy tubes. Additional mediastinal/ pleural drains and epicardial pacing leads not visualized, although this may be related to technique. Similar appearance of  basilar opacities at the lung bases obscuring the hemidiaphragm and the right heart border. No pneumothorax. Similar appearance of mixed interstitial opacities of the bilateral lungs. No interlobular septal thickening. IMPRESSION: Similar appearance of the lungs, with persisting basilar opacities, potentially combination of atelectasis/ consolidation. No evidence of significant edema or pneumothorax. Unchanged endotracheal tube, bilateral thoracostomy tubes, and left upper extremity PICC. Surgical changes of median sternotomy, aortic valve repair/ ascending aortic replacement, mitral valve annuloplasty, and CABG. Signed, Dulcy Fanny. Earleen Newport, DO [facility] Electronically Signed   By: Corrie Mckusick D.O.   On: 11/04/2016 20:36     Assessment/Plan: Post cardiotomy shock             CABG, MVR, AVR, aneurysm repair . On lasix gtt. Pressure remains soft, off  pressors currently.   Fever post cardiac surgery - likely multifactorial. Fevers started closely after the surgery, could be from clots vs ischemic digits, but now also has cdiff. Other Cultures negative. WBC improved but patient remains febrile. Will be hard to control fever without removing the source (gangrenous digits). -cont PO vanc and po flagyl x14 days. Hold any other abx for now. Monitor fever curve.  - he has a right sided effusion on CXR which is not improving with lasix or chest tube. Consider repositioning? sternal wound clean this AM. Iwas not able to express d/c, did not note significant erythema. If CVTS feels he has wound infection, will start vanco and cefepime (may make his C diff worse) IV  Cdiff colitis - on po vanc/flagyl as above.  Afib - on amio.  Gangrenous digits on both hands - 2/2 to DIC. ortho on board. Recommended amputation when patient is stable.   Transaminitis-  Likely from shock, improved significantly  VDRF - planning for trach soon. Management per PCCM.  Hypernatremia - likely from free water loss.   Met Alkolosis - likely from contraction alkalosis. May need break from lasix and some IVF.    Total days of antibiotics: day 4 of PO vanc/flagyl. 7 days of IV vanc off 12/11. (Off merrem/anidulafungin)          [name], [name] Infectious Diseases (pager) [phone] [email or web address] 11/06/2016, 8:46 AM  LOS: 17 days

## 2016-11-06 NOTE — Progress Notes (Signed)
Paged Phlebotomy again to notify that patient would require a lab draw for his Heparin level.

## 2016-11-06 NOTE — Progress Notes (Signed)
ANTICOAGULATION CONSULT NOTE - FOLLOW UP    HL = 0.39 (goal 0.3 - 0.7 units/mL) Heparin dosing weight = 97 kg   Assessment: 70 YOM with possible right atrial clot and Afib to continue on IV heparin.  SRA negative for HIT.  Heparin level is therapeutic; no bleeding reported.   Plan: - Continue heparin gtt at 1950 units/hr - F/U AM labs    Ilham Roughton D. Mina Marble, PharmD, BCPS 11/06/2016, 10:26 PM

## 2016-11-07 ENCOUNTER — Inpatient Hospital Stay (HOSPITAL_COMMUNITY): Payer: Medicare Other

## 2016-11-07 DIAGNOSIS — J9811 Atelectasis: Secondary | ICD-10-CM

## 2016-11-07 DIAGNOSIS — I998 Other disorder of circulatory system: Secondary | ICD-10-CM

## 2016-11-07 LAB — CBC
HCT: 30.7 % — ABNORMAL LOW (ref 39.0–52.0)
Hemoglobin: 8.9 g/dL — ABNORMAL LOW (ref 13.0–17.0)
MCH: 28.1 pg (ref 26.0–34.0)
MCHC: 29 g/dL — ABNORMAL LOW (ref 30.0–36.0)
MCV: 96.8 fL (ref 78.0–100.0)
PLATELETS: 301 10*3/uL (ref 150–400)
RBC: 3.17 MIL/uL — ABNORMAL LOW (ref 4.22–5.81)
RDW: 17.6 % — AB (ref 11.5–15.5)
WBC: 7.9 10*3/uL (ref 4.0–10.5)

## 2016-11-07 LAB — COMPREHENSIVE METABOLIC PANEL
ALT: 39 U/L (ref 17–63)
AST: 37 U/L (ref 15–41)
Albumin: 1.6 g/dL — ABNORMAL LOW (ref 3.5–5.0)
Alkaline Phosphatase: 87 U/L (ref 38–126)
Anion gap: 10 (ref 5–15)
BUN: 43 mg/dL — ABNORMAL HIGH (ref 6–20)
CO2: 29 mmol/L (ref 22–32)
Calcium: 7.5 mg/dL — ABNORMAL LOW (ref 8.9–10.3)
Chloride: 111 mmol/L (ref 101–111)
Creatinine, Ser: 1.02 mg/dL (ref 0.61–1.24)
GFR calc Af Amer: >60 mL/min (ref >60)
GFR calc non Af Amer: >60 mL/min (ref >60)
Glucose, Bld: 115 mg/dL — ABNORMAL HIGH (ref 65–99)
Potassium: 3.3 mmol/L — ABNORMAL LOW (ref 3.5–5.1)
Sodium: 150 mmol/L — ABNORMAL HIGH (ref 135–145)
Total Bilirubin: 1.2 mg/dL (ref 0.3–1.2)
Total Protein: 4.8 g/dL — ABNORMAL LOW (ref 6.5–8.1)

## 2016-11-07 LAB — GLUCOSE, CAPILLARY
GLUCOSE-CAPILLARY: 142 mg/dL — AB (ref 65–99)
GLUCOSE-CAPILLARY: 133 mg/dL — AB (ref 65–99)
Glucose-Capillary: 87 mg/dL (ref 65–99)
Glucose-Capillary: 131 mg/dL — ABNORMAL HIGH (ref 65–99)

## 2016-11-07 LAB — HEPARIN LEVEL (UNFRACTIONATED): HEPARIN UNFRACTIONATED: 0.46 [IU]/mL (ref 0.30–0.70)

## 2016-11-07 MED ORDER — SODIUM CHLORIDE 0.9 % IV SOLN
30.0000 meq | Freq: Once | INTRAVENOUS | Status: AC
  Administered 2016-11-07: 30 meq via INTRAVENOUS
  Filled 2016-11-07: qty 15

## 2016-11-07 NOTE — Progress Notes (Signed)
ANTICOAGULATION CONSULT NOTE - Follow Up Consult  Pharmacy Consult for Heparin Indication: possible clot in RA and afib   Allergies  Allergen Reactions  . Doxycycline Hives  . Penicillins Hives     Has patient had a PCN reaction causing immediate rash, facial/tongue/throat swelling, SOB or lightheadedness with hypotension: No Has patient had a PCN reaction causing severe rash involving mucus membranes or skin necrosis: No Has patient had a PCN reaction that required hospitalization: No Has patient had a PCN reaction occurring within the last 10 years:# # # YES # # #  If all of the above answers are "NO", then may proceed with Cephalosporin use.   . Sulfa Antibiotics Hives    Patient Measurements: Height: 5\' 9"  (175.3 cm) Weight: 252 lb 3.3 oz (114.4 kg) IBW/kg (Calculated) : 70.7  Heparin dosing wt: 97 kg  Vital Signs: Temp: 101.3 F (38.5 C) (12/16 1000) Temp Source: Core (Comment) (12/16 1000) BP: 105/72 (12/16 1000) Pulse Rate: 102 (12/16 1000)  Labs:  Recent Labs  11/04/16 1955  11/05/16 0500  11/06/16 0638 11/06/16 0930 11/06/16 0939 11/06/16 2146 11/07/16 0440 11/07/16 0520  HGB  --   < > 9.8*  --  9.2*  --   --   --  8.9*  --   HCT  --   --  32.5*  --  30.4*  --   --   --  30.7*  --   PLT  --   --  321  --  309  --   --   --  301  --   LABPROT 21.8*  --   --   --   --   --   --   --   --   --   INR 1.88  --   --   --   --   --   --   --   --   --   HEPARINUNFRC <0.10*  --  0.21*  < > 2.00*  --  0.80* 0.39  --  0.46  CREATININE  --   --  1.00  --   --  0.93  --   --  1.02  --   < > = values in this interval not displayed.  Estimated Creatinine Clearance: 84.1 mL/min (by C-G formula based on SCr of 1.02 mg/dL).  Assessment: 70yom on heparin for possible right atrial clot and afib. HIT ruled out (low heparin antibody and SRA negative).    Heparin level therapeutic 0.46 No issues with line or bleeding reported per RN.  Goal of Therapy:  Heparin level  goal 0.3-0.7 units/ml Monitor platelets by anticoagulation protocol: Yes   Plan:  heparin drip 1950 units / hr Daily HL   Erin Hearing PharmD., BCPS Clinical Pharmacist Pager [phone] 11/07/2016 11:02 AM

## 2016-11-07 NOTE — Progress Notes (Signed)
Patient ID: Scott [name], male   DOB: [birth year], 70 y.o.   MRN: [id number] [facility] DAILY ICU PROGRESS NOTE                   [address]            [place] [address]          [phone]   12 Days Post-Op Procedure(s) (LRB): STERNAL WASHOUT AND DELAYED PRIMARY CLOSURE (N/A) TRANSESOPHAGEAL ECHOCARDIOGRAM (TEE) (N/A)  18 days postop CORONARY ARTERY BYPASS GRAFTING (CABG)x 2 WITH LIMA TO DIAGONAL, OPEN  HARVESTING OF RIGHT SAPHENOUS VEIN FOR VEIN GRAFT TO LAD (N/A) AORTIC VALVE REPLACEMENT (AVR) WITH SIZE 25 MM MAGNA EASE PERICARDIAL BIOPROSTHESIS - AORTIC (N/A) TRANSESOPHAGEAL ECHOCARDIOGRAM (TEE) (N/A) MITRAL VALVE REPAIR (MVR) WITH SIZE 30 SORIN ANNULOFLEX ANNULOPLASTY RING WITH SUBSEQUENT REMOVAL OF RING (N/A) ASCENDING AORTIC  ANEURYSM REPAIR (AAA) WITH 28 MM HEMASHIELD PLATINUM WOVEN DOUBLE VELOUR VASCULAR GRAFT   Total Length of Stay:  LOS: 18 days   Subjective: Remains on vent , sedated on precedex  and fentyl  Objective: Vital signs in last 24 hours: Temp:  [98.1 F (36.7 C)-101.7 F (38.7 C)] 100.9 F (38.3 C) (12/16 0900) Pulse Rate:  [81-119] 104 (12/16 0900) Cardiac Rhythm: Atrial fibrillation (12/16 0800) Resp:  [12-23] 14 (12/16 0900) BP: (68-113)/(44-81) 93/68 (12/16 0900) SpO2:  [93 %-100 %] 97 % (12/16 0900) FiO2 (%):  [40 %] 40 % (12/16 0800) Weight:  [252 lb 3.3 oz (114.4 kg)] 252 lb 3.3 oz (114.4 kg) (12/16 0500)  Filed Weights   11/05/16 0500 11/06/16 0500 11/07/16 0500  Weight: 255 lb 15.3 oz (116.1 kg) 258 lb 2.5 oz (117.1 kg) 252 lb 3.3 oz (114.4 kg)    Weight change: -5 lb 15.2 oz (-2.7 kg)   Hemodynamic parameters for last 24 hours: CVP:  [11 mmHg-33 mmHg] 33 mmHg  Intake/Output from previous day: 12/15 0701 - 12/16 0700 In: 5287.7 [I.V.:2427.7; NG/GT:2595; IV Piggyback:265] Out: 3810 [Urine:5050; Stool:650; Chest Tube:540]  Intake/Output this shift: Total I/O In: 474 [I.V.:309; NG/GT:165] Out: 370 [Urine:350; Chest  Tube:20]  Current Meds: Scheduled Meds: . acyclovir  200 mg Oral QID  . amiodarone  400 mg Per Tube BID  . aspirin EC  325 mg Oral Daily   Or  . aspirin  324 mg Per Tube Daily  . chlorhexidine gluconate (MEDLINE KIT)  15 mL Mouth Rinse BID  . famotidine  20 mg Per Tube Q12H  . feeding supplement (PRO-STAT SUGAR FREE 64)  30 mL Per Tube BID  . free water  300 mL Per Tube Q6H  . insulin aspart  0-24 Units Subcutaneous Q4H  . insulin glargine  15 Units Subcutaneous BID  . mouth rinse  15 mL Mouth Rinse QID  . metroNIDAZOLE  500 mg Per Tube TID  . potassium chloride  40 mEq Per Tube Q6H  . potassium chloride (KCL MULTIRUN) 30 mEq in 265 mL IVPB  30 mEq Intravenous Once  . sodium chloride flush  3 mL Intravenous Q12H  . vancomycin  500 mg Per Tube Q6H   Continuous Infusions: . sodium chloride 10 mL/hr at 11/07/16 0900  . dexmedetomidine 1.199 mcg/kg/hr (11/07/16 0900)  . feeding supplement (VITAL HIGH PROTEIN) 1,000 mL (11/07/16 0900)  . fentaNYL infusion INTRAVENOUS 300 mcg/hr (11/07/16 0900)  . furosemide (LASIX) infusion 10 mg/hr (11/07/16 0900)  . heparin 1,950 Units/hr (11/07/16 0900)  . phenylephrine (NEO-SYNEPHRINE) Adult infusion Stopped (11/03/16 1800)   PRN  Meds:.acetaminophen (TYLENOL) oral liquid 160 mg/5 mL, Telly Jawad's butt cream, ipratropium-albuterol, metoprolol, midazolam, morphine injection, ondansetron (ZOFRAN) IV, sodium chloride flush  Hands and feet unchanged   Lab Results: CBC: Recent Labs  11/06/16 0638 11/07/16 0440  WBC 8.4 7.9  HGB 9.2* 8.9*  HCT 30.4* 30.7*  PLT 309 301   BMET:  Recent Labs  11/06/16 0930 11/07/16 0440  NA 148* 150*  K 3.1* 3.3*  CL 109 111  CO2 33* 29  GLUCOSE 105* 115*  BUN 41* 43*  CREATININE 0.93 1.02  CALCIUM 7.4* 7.5*    CMET: Lab Results  Component Value Date   WBC 7.9 11/07/2016   HGB 8.9 (L) 11/07/2016   HCT 30.7 (L) 11/07/2016   PLT 301 11/07/2016   GLUCOSE 115 (H) 11/07/2016   CHOL 134 10/21/2016    TRIG 203 (H) 10/21/2016   HDL 28 (L) 10/21/2016   LDLCALC 65 10/21/2016   ALT 39 11/07/2016   AST 37 11/07/2016   NA 150 (H) 11/07/2016   K 3.3 (L) 11/07/2016   CL 111 11/07/2016   CREATININE 1.02 11/07/2016   BUN 43 (H) 11/07/2016   CO2 29 11/07/2016   TSH 1.93 10/19/2016   INR 1.88 11/04/2016   HGBA1C 6.6 (H) 10/21/2016    PT/INR:  Recent Labs  11/04/16 1955  LABPROT 21.8*  INR 1.88   Radiology: Dg Chest Port 1 View  Result Date: 11/07/2016 CLINICAL DATA:  Shortness of breath, hila EXAM: PORTABLE CHEST 1 VIEW COMPARISON:  11/05/2016 FINDINGS: Support devices are unchanged. Bilateral chest tubes remain in place. No visible pneumothorax. Cardiomegaly with vascular congestion and bibasilar atelectasis, improving since prior study. IMPRESSION: Improving bibasilar atelectasis. Cardiomegaly, vascular congestion. No pneumothorax. Electronically Signed   By: Rolm Baptise M.D.   On: 11/07/2016 07:49     Assessment/Plan: S/P Procedure(s) (LRB): STERNAL WASHOUT AND DELAYED PRIMARY CLOSURE (N/A) TRANSESOPHAGEAL ECHOCARDIOGRAM (TEE) (N/A) Continued attempt to wean vent per ccm Replacing k     Scott [name] 11/07/2016 9:18 AM

## 2016-11-07 NOTE — Progress Notes (Signed)
PCCM Progress Note  ADMISSION DATE:  10/20/2016 CONSULTATION DATE: 10/23/2016 REFERRING MD: Ricard Dillon  CHIEF COMPLAINT:  Chest pain  HISTORY OF PRESENT ILLNESS:   70 yo former smoker with known CAD and ascending aorta aneurysm, who was taken to OR 11/30 for cabg x 3, aortic root replacement, MVR replacement, thoracic cavity left open and had extended pump time in OR.  SUBJECTIVE:  Lasix gtt continues - remains net POS. No pressors.  Agitated at times.  No WUA or SBT yet this am r/t agitation.   VITAL SIGNS: BP 93/68   Pulse (!) 104   Temp (!) 100.9 F (38.3 C)   Resp 14   Ht 5\' 9"  (1.753 m)   Wt 114.4 kg (252 lb 3.3 oz)   SpO2 97%   BMI 37.24 kg/m   HEMODYNAMICS: CVP:  [11 mmHg-33 mmHg] 33 mmHg  INTAKE / OUTPUT: I/O last 3 completed shifts: In: 8183.6 [I.V.:3933.6; NG/GT:3985; IV Piggyback:265] Out: [id number] [Urine:7750; Stool:750; Chest Tube:830]  PHYSICAL EXAMINATION: General: ill appearing male, NAD  Neuro: RASS +1, waving arms, pulling at tubes at times, does not follow commands  HEENT:  ETT in place Cardiovascular: irregular, chest closed Lungs:  No wheeze Abdomen:  Soft, non tender Ext: 1+ edema, extremities cool Skin: Ischemic changes in fingers b/l  CMP Latest Ref Rng & Units 11/07/2016 11/06/2016 11/05/2016  Glucose 65 - 99 mg/dL 115(H) 105(H) 150(H)  BUN 6 - 20 mg/dL 43(H) 41(H) 44(H)  Creatinine 0.61 - 1.24 mg/dL 1.02 0.93 1.00  Sodium 135 - 145 mmol/L 150(H) 148(H) 147(H)  Potassium 3.5 - 5.1 mmol/L 3.3(L) 3.1(L) 3.5  Chloride 101 - 111 mmol/L 111 109 107  CO2 22 - 32 mmol/L 29 33(H) 33(H)  Calcium 8.9 - 10.3 mg/dL 7.5(L) 7.4(L) 7.8(L)  Total Protein 6.5 - 8.1 g/dL 4.8(L) - 5.0(L)  Total Bilirubin 0.3 - 1.2 mg/dL 1.2 - 1.0  Alkaline Phos 38 - 126 U/L 87 - 111  AST 15 - 41 U/L 37 - 46(H)  ALT 17 - 63 U/L 39 - 55    CBC Latest Ref Rng & Units 11/07/2016 11/06/2016 11/05/2016  WBC 4.0 - 10.5 K/uL 7.9 8.4 9.9  Hemoglobin 13.0 - 17.0 g/dL 8.9(L) 9.2(L)  9.8(L)  Hematocrit 39.0 - 52.0 % 30.7(L) 30.4(L) 32.5(L)  Platelets 150 - 400 K/uL 301 309 321    ABG    Component Value Date/Time   PHART 7.511 (H) 11/04/2016 1950   PCO2ART 43.5 11/04/2016 1950   PO2ART 274.0 (H) 11/04/2016 1950   HCO3 34.4 (H) 11/04/2016 1950   TCO2 36 11/04/2016 1950   ACIDBASEDEF 1.0 10/27/2016 1125   O2SAT 63.2 11/06/2016 0410    CBG (last 3)   Recent Labs  11/06/16 1636 11/06/16 2048 11/07/16 0001  GLUCAP 137* 103* 114*    Imaging: Dg Chest Port 1 View  Result Date: 11/07/2016 CLINICAL DATA:  Shortness of breath, hila EXAM: PORTABLE CHEST 1 VIEW COMPARISON:  11/05/2016 FINDINGS: Support devices are unchanged. Bilateral chest tubes remain in place. No visible pneumothorax. Cardiomegaly with vascular congestion and bibasilar atelectasis, improving since prior study. IMPRESSION: Improving bibasilar atelectasis. Cardiomegaly, vascular congestion. No pneumothorax. Electronically Signed   By: Rolm Baptise M.D.   On: 11/07/2016 07:49    STUDIES:  12.5 dopplers lowers>>>neg 12/6 dopplers arms >>>neg 12/7 abdo us>>>Cholelithiasis without sonographic evidence for acute cholecystitis. No biliary dilatation.Increased hepatic echogenicity, suggestive of steatosis. Ascites. 12/7 echo>>>60-65%, rv poor images but appeared wnl  CULTURES: C diff 12/12 >> positive Blood  12/12 >> Rt thigh wound 12/12 >> neg   ANTIBIOTICS:  Acyclovir 12/10 >> PO vancomycin 12/12 >> Flagyl 12/13 >>  SIGNIFICANT EVENTS: 11/30 surgery, hypoxia 12/4 closed chest 12/7 high fevers, LFt up  LINES/TUBES: 11/30 ETT>> 12/04 picc left >>> 11/30 CT x 4>> R CT x 2 12/16>>>  ASSESSMENT / PLAN:  Acute hypoxic respiratory failure after CABG with MVR. - pressure support wean as tolerated - tentative plan for trach 12/18 with TCTS - f/u CXR  CAD s/p CABG, MVR, aortic root replacement. A fib. Rt atrial clot. - PO amiodarone, ASA - lasix gtt per TCTS/heart failure  - heparin  gtt per pharmacy   B/l hand/feet ischemia. - will need further therapy when more stable  C diff colitis. - flagyl, enteral vancomycin per ID  Anemia of critical illness. - f/u CBC  DM. - SSI with lantus  Acute metabolic encephalopathy. - RASS goal 0 to -1 - continue precedex  DVT prophylaxis - heparin gtt SUP - Pepcid Nutrition - tubes feeds Goals of care - Full code  Resolved problems >> DIC  No family available 12/16.    Scott Madrid, NP 11/07/2016  9:06 AM Pager: [phone] or [phone]   Tolerating pressure support.  HR regular.  B/l crackles.  Abd soft. 1+ edema.  Assessment/plan:  Acute respiratory failure. - still think he will need trach to assist with vent weaning due to overall deconditioning  Chesley Mires, MD [place] 11/07/2016, 12:20 PM Pager:  [phone] After 3pm call: [phone]

## 2016-11-07 NOTE — Progress Notes (Signed)
Patient ID: Scott [name], male   DOB: 11/30/1945, 70 y.o.   MRN: [id number] EVENING ROUNDS NOTE :     [address]       [place] [address]             [phone]                 12 Days Post-Op Procedure(s) (LRB): STERNAL WASHOUT AND DELAYED PRIMARY CLOSURE (N/A) TRANSESOPHAGEAL ECHOCARDIOGRAM (TEE) (N/A)  Total Length of Stay:  LOS: 18 days  BP (!) 86/72   Pulse (!) 103   Temp (!) 100.6 F (38.1 C)   Resp 20   Ht 5\' 9"  (1.753 m)   Wt 252 lb 3.3 oz (114.4 kg)   SpO2 99%   BMI 37.24 kg/m   .Intake/Output      12/16 0701 - 12/17 0700   I.V. (mL/kg) 1226 (10.7)   Other    NG/GT 890   IV Piggyback 265   Total Intake(mL/kg) 2381 (20.8)   Urine (mL/kg/hr) 2350 (1.6)   Emesis/NG output 0 (0)   Stool 500 (0.4)   Chest Tube 340 (0.2)   Total Output 3190   Net -809         . sodium chloride 10 mL/hr at 11/07/16 1700  . dexmedetomidine 1.2 mcg/kg/hr (11/07/16 1900)  . feeding supplement (VITAL HIGH PROTEIN) 1,000 mL (11/07/16 1900)  . fentaNYL infusion INTRAVENOUS 300 mcg/hr (11/07/16 1900)  . furosemide (LASIX) infusion 10 mg/hr (11/07/16 1900)  . heparin 1,950 Units/hr (11/07/16 1900)  . phenylephrine (NEO-SYNEPHRINE) Adult infusion Stopped (11/03/16 1800)     Lab Results  Component Value Date   WBC 7.9 11/07/2016   HGB 8.9 (L) 11/07/2016   HCT 30.7 (L) 11/07/2016   PLT 301 11/07/2016   GLUCOSE 115 (H) 11/07/2016   CHOL 134 10/21/2016   TRIG 203 (H) 10/21/2016   HDL 28 (L) 10/21/2016   LDLCALC 65 10/21/2016   ALT 39 11/07/2016   AST 37 11/07/2016   NA 150 (H) 11/07/2016   K 3.3 (L) 11/07/2016   CL 111 11/07/2016   CREATININE 1.02 11/07/2016   BUN 43 (H) 11/07/2016   CO2 29 11/07/2016   TSH 1.93 10/19/2016   INR 1.88 11/04/2016   HGBA1C 6.6 (H) 10/21/2016   Did not tolerate weaning, now back full sedation   [name] MD  Beeper [phone] Office [phone] 11/07/2016 7:29 PM

## 2016-11-07 NOTE — Progress Notes (Signed)
[facility]   Reason for visit: Follow up on fever  Interval History: remains intubated, on precedex, neo off; intermittent versed  CXR independently reviewed and some atelectasis but generally clear  Physical Exam: Constitutional:  Vitals:   11/07/16 0955 11/07/16 1000  BP: 97/72 105/72  Pulse: (!) 102 (!) 102  Resp: (!) 23 18  Temp:  (!) 101.3 F (38.5 C)   intubated, paralyzed Eyes: anicteric HENT: +ET Respiratory: on vent; CTA B anterior exam Cardiovascular: tachyRR GI: soft, nt, nd  Review of Systems: Unable to be assessed due to mental status  Lab Results  Component Value Date   WBC 7.9 11/07/2016   HGB 8.9 (L) 11/07/2016   HCT 30.7 (L) 11/07/2016   MCV 96.8 11/07/2016   PLT 301 11/07/2016    Lab Results  Component Value Date   CREATININE 1.02 11/07/2016   BUN 43 (H) 11/07/2016   NA 150 (H) 11/07/2016   K 3.3 (L) 11/07/2016   CL 111 11/07/2016   CO2 29 11/07/2016    Lab Results  Component Value Date   ALT 39 11/07/2016   AST 37 11/07/2016   ALKPHOS 87 11/07/2016     Microbiology: Recent Results (from the past 240 hour(s))  Culture, blood (Routine X 2) w Reflex to ID Panel     Status: None   Collection Time: 10/29/16  2:47 AM  Result Value Ref Range Status   Specimen Description BLOOD RIGHT HAND  Final   Special Requests IN PEDIATRIC BOTTLE [place]  Final   Culture NO GROWTH 5 DAYS  Final   Report Status 11/03/2016 FINAL  Final  Culture, blood (Routine X 2) w Reflex to ID Panel     Status: None   Collection Time: 10/29/16  5:51 AM  Result Value Ref Range Status   Specimen Description BLOOD RIGHT ARM  Final   Special Requests BOTTLES DRAWN AEROBIC AND ANAEROBIC 5CC   Final   Culture NO GROWTH 5 DAYS  Final   Report Status 11/03/2016 FINAL  Final  Culture, respiratory (NON-Expectorated)     Status: None   Collection Time: 10/29/16 10:21 AM  Result Value Ref Range Status   Specimen Description TRACHEAL ASPIRATE  Final     Special Requests NONE  Final   Gram Stain   Final    FEW WBC PRESENT, PREDOMINANTLY PMN MODERATE GRAM NEGATIVE RODS RARE GRAM POSITIVE COCCI    Culture Consistent with normal respiratory flora.  Final   Report Status 10/31/2016 FINAL  Final  Culture, respiratory (NON-Expectorated)     Status: None   Collection Time: 10/31/16 12:37 PM  Result Value Ref Range Status   Specimen Description TRACHEAL ASPIRATE  Final   Special Requests Normal  Final   Gram Stain   Final    RARE WBC PRESENT, PREDOMINANTLY MONONUCLEAR RARE GRAM NEGATIVE COCCOBACILLI    Culture RARE CANDIDA ALBICANS  Final   Report Status 11/03/2016 FINAL  Final  Culture, blood (single) w Reflex to ID Panel     Status: None   Collection Time: 10/31/16  2:08 PM  Result Value Ref Range Status   Specimen Description BLOOD RIGHT ANTECUBITAL  Final   Special Requests IN PEDIATRIC BOTTLE 3CC  Final   Culture NO GROWTH 5 DAYS  Final   Report Status 11/05/2016 FINAL  Final  Aerobic Culture (superficial specimen)     Status: None   Collection Time: 11/03/16  8:46 AM  Result Value Ref Range Status  Specimen Description WOUND RIGHT THIGH  Final   Special Requests NONE  Final   Gram Stain   Final    RARE WBC PRESENT, PREDOMINANTLY PMN NO ORGANISMS SEEN    Culture NO GROWTH 2 DAYS  Final   Report Status 11/06/2016 FINAL  Final  Culture, blood (routine x 2)     Status: None (Preliminary result)   Collection Time: 11/03/16  5:50 PM  Result Value Ref Range Status   Specimen Description BLOOD RIGHT ANTECUBITAL  Final   Special Requests BOTTLES DRAWN AEROBIC AND ANAEROBIC 10ML EACH  Final   Culture NO GROWTH 3 DAYS  Final   Report Status PENDING  Incomplete  Culture, blood (routine x 2)     Status: None (Preliminary result)   Collection Time: 11/03/16  5:58 PM  Result Value Ref Range Status   Specimen Description BLOOD RIGHT ANTECUBITAL  Final   Special Requests BOTTLES DRAWN AEROBIC AND ANAEROBIC 10CC EACH  Final    Culture NO GROWTH 3 DAYS  Final   Report Status PENDING  Incomplete  C difficile quick scan w PCR reflex     Status: Abnormal   Collection Time: 11/03/16  6:21 PM  Result Value Ref Range Status   C Diff antigen POSITIVE (A) NEGATIVE Final   C Diff toxin POSITIVE (A) NEGATIVE Final   C Diff interpretation Toxin producing C. difficile detected.  Final    Comment: CRITICAL RESULT CALLED TO, READ BACK BY AND VERIFIED WITH: A.HARGETT,RN AT 2322 BY L.PITT 11/03/16     Impression/Plan:  1. Fever - no clear source though C diff possible, clots.  I will check blood cultures again.   2. C diff - on vanco and flagyl suspension.   3. Antiviral - on acyclovir since 12/10.  No clear indication from my review.   4. Ischemia - from pressors.  Will need amputation once stable.

## 2016-11-08 ENCOUNTER — Inpatient Hospital Stay (HOSPITAL_COMMUNITY): Payer: Medicare Other

## 2016-11-08 LAB — GLUCOSE, CAPILLARY
GLUCOSE-CAPILLARY: 166 mg/dL — AB (ref 65–99)
Glucose-Capillary: 107 mg/dL — ABNORMAL HIGH (ref 65–99)
Glucose-Capillary: 127 mg/dL — ABNORMAL HIGH (ref 65–99)
Glucose-Capillary: 164 mg/dL — ABNORMAL HIGH (ref 65–99)
Glucose-Capillary: 197 mg/dL — ABNORMAL HIGH (ref 65–99)
Glucose-Capillary: 94 mg/dL (ref 65–99)

## 2016-11-08 LAB — HEPARIN LEVEL (UNFRACTIONATED): Heparin Unfractionated: 0.53 IU/mL (ref 0.30–0.70)

## 2016-11-08 LAB — CBC
HEMATOCRIT: 35.3 % — AB (ref 39.0–52.0)
Hemoglobin: 10 g/dL — ABNORMAL LOW (ref 13.0–17.0)
MCH: 27.6 pg (ref 26.0–34.0)
MCHC: 28.3 g/dL — ABNORMAL LOW (ref 30.0–36.0)
MCV: 97.5 fL (ref 78.0–100.0)
Platelets: 302 10*3/uL (ref 150–400)
RBC: 3.62 MIL/uL — AB (ref 4.22–5.81)
RDW: 17.9 % — ABNORMAL HIGH (ref 11.5–15.5)
WBC: 10.6 10*3/uL — ABNORMAL HIGH (ref 4.0–10.5)

## 2016-11-08 LAB — BASIC METABOLIC PANEL
ANION GAP: 7 (ref 5–15)
BUN: 43 mg/dL — ABNORMAL HIGH (ref 6–20)
CO2: 30 mmol/L (ref 22–32)
Calcium: 7.9 mg/dL — ABNORMAL LOW (ref 8.9–10.3)
Chloride: 113 mmol/L — ABNORMAL HIGH (ref 101–111)
Creatinine, Ser: 1.08 mg/dL (ref 0.61–1.24)
GLUCOSE: 139 mg/dL — AB (ref 65–99)
POTASSIUM: 4.5 mmol/L (ref 3.5–5.1)
Sodium: 150 mmol/L — ABNORMAL HIGH (ref 135–145)

## 2016-11-08 LAB — CULTURE, BLOOD (ROUTINE X 2)
CULTURE: NO GROWTH
Culture: NO GROWTH

## 2016-11-08 LAB — LACTIC ACID, PLASMA: Lactic Acid, Venous: 1.2 mmol/L (ref 0.5–1.9)

## 2016-11-08 MED ORDER — CHLORHEXIDINE GLUCONATE CLOTH 2 % EX PADS
6.0000 | MEDICATED_PAD | Freq: Once | CUTANEOUS | Status: AC
  Administered 2016-11-08: 6 via TOPICAL

## 2016-11-08 MED ORDER — SODIUM CHLORIDE 0.9 % IV BOLUS (SEPSIS)
750.0000 mL | Freq: Once | INTRAVENOUS | Status: AC
  Administered 2016-11-08: 750 mL via INTRAVENOUS

## 2016-11-08 MED ORDER — CHLORHEXIDINE GLUCONATE 0.12 % MT SOLN
15.0000 mL | Freq: Once | OROMUCOSAL | Status: AC
  Administered 2016-11-09: 15 mL via OROMUCOSAL
  Filled 2016-11-08: qty 15

## 2016-11-08 NOTE — Progress Notes (Signed)
Patient desating 82-84% on 40%. Notified RT. RT came to bedside to assess. Patient now sating 100%. Will continue to monitor.  Levon Hedger, RN

## 2016-11-08 NOTE — Progress Notes (Signed)
Patient ID: Scott [name], male   DOB: [birth year], 70 y.o.   MRN: [id number] TCTS DAILY ICU PROGRESS NOTE                   [address]            [place], [address]          [phone]   13 Days Post-Op Procedure(s) (LRB): STERNAL WASHOUT AND DELAYED PRIMARY CLOSURE (N/A) TRANSESOPHAGEAL ECHOCARDIOGRAM (TEE) (N/A)  19  days postop CORONARY ARTERY BYPASS GRAFTING (CABG)x 2 WITH LIMA TO DIAGONAL, OPEN  HARVESTING OF RIGHT SAPHENOUS VEIN FOR VEIN GRAFT TO LAD (N/A) AORTIC VALVE REPLACEMENT (AVR) WITH SIZE 25 MM MAGNA EASE PERICARDIAL BIOPROSTHESIS - AORTIC (N/A) TRANSESOPHAGEAL ECHOCARDIOGRAM (TEE) (N/A) MITRAL VALVE REPAIR (MVR) WITH SIZE 30 SORIN ANNULOFLEX ANNULOPLASTY RING WITH SUBSEQUENT REMOVAL OF RING (N/A) ASCENDING AORTIC  ANEURYSM REPAIR (AAA) WITH 28 MM HEMASHIELD PLATINUM WOVEN DOUBLE VELOUR VASCULAR GRAFT   Total Length of Stay:  LOS: 19 days   Subjective: Remains on vent , sedated on precedex  and fentyl. Increased episodes of desaturation required increased sedation and vent adjustment with increased PS  Objective: Vital signs in last 24 hours: Temp:  [99.5 F (37.5 C)-101.8 F (38.8 C)] 100.6 F (38.1 C) (12/17 0700) Pulse Rate:  [92-136] 105 (12/17 0904) Cardiac Rhythm: Normal sinus rhythm (12/17 0430) Resp:  [12-31] 14 (12/17 0904) BP: (75-164)/(29-116) 92/66 (12/17 0904) SpO2:  [81 %-100 %] 100 % (12/17 0904) FiO2 (%):  [40 %-100 %] 100 % (12/17 0904) Weight:  [249 lb 9 oz (113.2 kg)] 249 lb 9 oz (113.2 kg) (12/17 0500)  Filed Weights   11/06/16 0500 11/07/16 0500 11/08/16 0500  Weight: 258 lb 2.5 oz (117.1 kg) 252 lb 3.3 oz (114.4 kg) 249 lb 9 oz (113.2 kg)    Weight change: -2 lb 10.3 oz (-1.2 kg)   Hemodynamic parameters for last 24 hours: CVP:  [13 mmHg-14 mmHg] 14 mmHg  Intake/Output from previous day: 12/16 0701 - 12/17 0700 In: 5189 [I.V.:2379; OY/DX:4128; IV Piggyback:265] Out: 7030 [Urine:5900; Stool:700; Chest  Tube:430]  Intake/Output this shift: No intake/output data recorded.  Current Meds: Scheduled Meds: . acyclovir  200 mg Oral QID  . amiodarone  400 mg Per Tube BID  . aspirin EC  325 mg Oral Daily   Or  . aspirin  324 mg Per Tube Daily  . chlorhexidine gluconate (MEDLINE KIT)  15 mL Mouth Rinse BID  . famotidine  20 mg Per Tube Q12H  . feeding supplement (PRO-STAT SUGAR FREE 64)  30 mL Per Tube BID  . free water  300 mL Per Tube Q6H  . insulin aspart  0-24 Units Subcutaneous Q4H  . insulin glargine  15 Units Subcutaneous BID  . mouth rinse  15 mL Mouth Rinse QID  . metroNIDAZOLE  500 mg Per Tube TID  . potassium chloride  40 mEq Per Tube Q6H  . sodium chloride flush  3 mL Intravenous Q12H  . vancomycin  500 mg Per Tube Q6H   Continuous Infusions: . sodium chloride 10 mL/hr at 11/08/16 0700  . dexmedetomidine 1.2 mcg/kg/hr (11/08/16 0844)  . feeding supplement (VITAL HIGH PROTEIN) 1,000 mL (11/08/16 0844)  . fentaNYL infusion INTRAVENOUS 300 mcg/hr (11/08/16 0700)  . furosemide (LASIX) infusion 10 mg/hr (11/08/16 0700)  . heparin 1,950 Units/hr (11/08/16 0700)  . phenylephrine (NEO-SYNEPHRINE) Adult infusion Stopped (11/03/16 1800)   PRN Meds:.acetaminophen (TYLENOL) oral liquid 160 mg/5 mL, Finnbar Cedillos's  butt cream, ipratropium-albuterol, metoprolol, midazolam, morphine injection, ondansetron (ZOFRAN) IV, sodium chloride flush Sedated on vent  Wound intact Sinus rhythm  Hands and feet unchanged   Lab Results: CBC:  Recent Labs  11/07/16 0440 11/08/16 0332  WBC 7.9 10.6*  HGB 8.9* 10.0*  HCT 30.7* 35.3*  PLT 301 302   BMET:   Recent Labs  11/07/16 0440 11/08/16 0332  NA 150* 150*  K 3.3* 4.5  CL 111 113*  CO2 29 30  GLUCOSE 115* 139*  BUN 43* 43*  CREATININE 1.02 1.08  CALCIUM 7.5* 7.9*    CMET: Lab Results  Component Value Date   WBC 10.6 (H) 11/08/2016   HGB 10.0 (L) 11/08/2016   HCT 35.3 (L) 11/08/2016   PLT 302 11/08/2016   GLUCOSE 139 (H)  11/08/2016   CHOL 134 10/21/2016   TRIG 203 (H) 10/21/2016   HDL 28 (L) 10/21/2016   LDLCALC 65 10/21/2016   ALT 39 11/07/2016   AST 37 11/07/2016   NA 150 (H) 11/08/2016   K 4.5 11/08/2016   CL 113 (H) 11/08/2016   CREATININE 1.08 11/08/2016   BUN 43 (H) 11/08/2016   CO2 30 11/08/2016   TSH 1.93 10/19/2016   INR 1.88 11/04/2016   HGBA1C 6.6 (H) 10/21/2016    PT/INR: No results for input(s): LABPROT, INR in the last 72 hours. Radiology: No results found. Chest xray this am reviewed   Assessment/Plan: S/P Procedure(s) (LRB): STERNAL WASHOUT AND DELAYED PRIMARY CLOSURE (N/A) TRANSESOPHAGEAL ECHOCARDIOGRAM (TEE) (N/A) Failed weaning attempts , trach tomorrow BUN/cr elevated and Na elevated , likely intervascular volume depleted - decrease lasix drip Hold Heparin after midnight for poss trach in am- Dr Roxy Manns has discussed with patient wife     Scott [name] 11/08/2016 9:50 AM Patient ID: Scott [name], male   DOB: June 08, 1946, 70 y.o.   MRN: [id number]

## 2016-11-08 NOTE — Plan of Care (Signed)
Problem: Cardiac: Goal: Will show no signs and symptoms of excessive bleeding Outcome: Progressing Minimal chest tube output.

## 2016-11-08 NOTE — Progress Notes (Signed)
Patient ID: Scott [name], male   DOB: [birth year], 70 y.o.   MRN: [id number] EVENING ROUNDS NOTE :     [address]       [place] [address]             [phone]                 13 Days Post-Op Procedure(s) (LRB): STERNAL WASHOUT AND DELAYED PRIMARY CLOSURE (N/A) TRANSESOPHAGEAL ECHOCARDIOGRAM (TEE) (N/A)  Total Length of Stay:  LOS: 19 days  BP 109/73 (BP Location: Right Arm)   Pulse (!) 103   Temp (!) 101.1 F (38.4 C) (Core (Comment))   Resp 16   Ht 5\' 9"  (1.753 m)   Wt 249 lb 9 oz (113.2 kg)   SpO2 100%   BMI 36.85 kg/m   .Intake/Output      12/17 0701 - 12/18 0700   I.V. (mL/kg) 1023 (9)   NG/GT 605   IV Piggyback    Total Intake(mL/kg) 1628 (14.4)   Urine (mL/kg/hr) 3000 (2.2)   Emesis/NG output 75 (0.1)   Stool    Chest Tube 80 (0.1)   Total Output 3155   Net -1527         . sodium chloride 10 mL/hr at 11/08/16 1800  . dexmedetomidine 1.199 mcg/kg/hr (11/08/16 1800)  . feeding supplement (VITAL HIGH PROTEIN) 1,000 mL (11/08/16 1800)  . fentaNYL infusion INTRAVENOUS 300 mcg/hr (11/08/16 1800)  . heparin 1,950 Units/hr (11/08/16 1800)  . phenylephrine (NEO-SYNEPHRINE) Adult infusion Stopped (11/03/16 1800)     Lab Results  Component Value Date   WBC 10.6 (H) 11/08/2016   HGB 10.0 (L) 11/08/2016   HCT 35.3 (L) 11/08/2016   PLT 302 11/08/2016   GLUCOSE 139 (H) 11/08/2016   CHOL 134 10/21/2016   TRIG 203 (H) 10/21/2016   HDL 28 (L) 10/21/2016   LDLCALC 65 10/21/2016   ALT 39 11/07/2016   AST 37 11/07/2016   NA 150 (H) 11/08/2016   K 4.5 11/08/2016   CL 113 (H) 11/08/2016   CREATININE 1.08 11/08/2016   BUN 43 (H) 11/08/2016   CO2 30 11/08/2016   TSH 1.93 10/19/2016   INR 1.88 11/04/2016   HGBA1C 6.6 (H) 10/21/2016   Lasix drip decreased , now off with BP down Trach in am Sedated on vent   [name] MD  Beeper [phone] Office [phone] 11/08/2016 7:19 PM

## 2016-11-08 NOTE — Progress Notes (Signed)
Patient back down to 82% O2 Saturation on 40% FIO2. Called RT to bedside and vent changes were made. Will continue to monitor patient.  Levon Hedger, RN

## 2016-11-08 NOTE — Plan of Care (Signed)
Problem: Activity: Goal: Risk for activity intolerance will decrease Outcome: Not Progressing Patient intubated/sedated.

## 2016-11-08 NOTE — Progress Notes (Signed)
ANTICOAGULATION CONSULT NOTE - Follow Up Consult  Pharmacy Consult for Heparin Indication: possible clot in RA and afib   Allergies  Allergen Reactions  . Doxycycline Hives  . Penicillins Hives     Has patient had a PCN reaction causing immediate rash, facial/tongue/throat swelling, SOB or lightheadedness with hypotension: No Has patient had a PCN reaction causing severe rash involving mucus membranes or skin necrosis: No Has patient had a PCN reaction that required hospitalization: No Has patient had a PCN reaction occurring within the last 10 years:# # # YES # # #  If all of the above answers are "NO", then may proceed with Cephalosporin use.   . Sulfa Antibiotics Hives    Patient Measurements: Height: 5\' 9"  (175.3 cm) Weight: 249 lb 9 oz (113.2 kg) IBW/kg (Calculated) : 70.7  Heparin dosing wt: 97 kg  Vital Signs: Temp: 100.6 F (38.1 C) (12/17 0700) Temp Source: Core (Comment) (12/17 0430) BP: 92/66 (12/17 0904) Pulse Rate: 105 (12/17 0904)  Labs:  Recent Labs  11/06/16 [id number] 11/06/16 0930  11/06/16 2146 11/07/16 0440 11/07/16 0520 11/08/16 0332  HGB 9.2*  --   --   --  8.9*  --  10.0*  HCT 30.4*  --   --   --  30.7*  --  35.3*  PLT 309  --   --   --  301  --  302  HEPARINUNFRC 2.00*  --   < > 0.39  --  0.46 0.53  CREATININE  --  0.93  --   --  1.02  --  1.08  < > = values in this interval not displayed.  Estimated Creatinine Clearance: 78.9 mL/min (by C-G formula based on SCr of 1.08 mg/dL).  Assessment: 70yom on heparin for possible right atrial clot and afib. HIT ruled out (low heparin antibody and SRA negative).    Heparin level therapeutic 0.53 No issues with line or bleeding reported per RN.  Goal of Therapy:  Heparin level goal 0.3-0.7 units/ml Monitor platelets by anticoagulation protocol: Yes   Plan:  heparin drip 1950 units / hr Daily HL  Trach planned for 12/18  Erin Hearing PharmD., BCPS Clinical Pharmacist Pager [phone] 11/08/2016  10:54 AM

## 2016-11-08 NOTE — Plan of Care (Signed)
Problem: Education: Goal: Knowledge of [facility] Education information/materials will improve Outcome: Not Progressing Patient intubated/sedated. RN discussing progress and plan with family.

## 2016-11-08 NOTE — Plan of Care (Signed)
Problem: Education: Goal: Understanding of CV disease, CV risk reduction, and recovery process will improve Outcome: Not Progressing Patient intubated/sedated. Information given to family at bedside.

## 2016-11-08 NOTE — Plan of Care (Signed)
Problem: Bowel/Gastric: Goal: Gastrointestinal status for postoperative course will improve Outcome: Progressing Patient on tube feeds and tolerating well. Will reevaluate.

## 2016-11-08 NOTE — Progress Notes (Signed)
PCCM Progress Note  ADMISSION DATE:  10/20/2016 CONSULTATION DATE: 10/23/2016 REFERRING MD: Ricard Dillon  CHIEF COMPLAINT:  Chest pain  HISTORY OF PRESENT ILLNESS:   70 yo former smoker with known CAD and ascending aorta aneurysm, who was taken to OR 11/30 for cabg x 3, aortic root replacement, MVR replacement, thoracic cavity left open and had extended pump time in OR.  SUBJECTIVE:  Intermittent episodes of hypoxia , improved with suctioning .  B/p flucuating high and low  Remains neg 2L on lasix drip  Fevers persist   VITAL SIGNS: BP 92/66   Pulse (!) 105   Temp (!) 100.6 F (38.1 C)   Resp 14   Ht 5\' 9"  (1.753 m)   Wt 113.2 kg (249 lb 9 oz)   SpO2 100%   BMI 36.85 kg/m   HEMODYNAMICS: CVP:  [13 mmHg-14 mmHg] 14 mmHg  INTAKE / OUTPUT: I/O last 3 completed shifts: In: 7622.2 [I.V.:3552.2; NG/GT:3805; IV Piggyback:265] Out: [id number] [Urine:8350; [id number]; Chest Tube:720]  PHYSICAL EXAMINATION: General: ill appearing male, NAD  Neuro: RASS -1 not follow commands , sedated  HEENT:  ETT in place Cardiovascular: irregular, chest closed Lungs:  No wheeze Abdomen:  Soft, non tender Ext: 1+ edema, extremities cool Skin: Ischemic changes in fingers/toes  b/l  CMP Latest Ref Rng & Units 11/08/2016 11/07/2016 11/06/2016  Glucose 65 - 99 mg/dL 139(H) 115(H) 105(H)  BUN 6 - 20 mg/dL 43(H) 43(H) 41(H)  Creatinine 0.61 - 1.24 mg/dL 1.08 1.02 0.93  Sodium 135 - 145 mmol/L 150(H) 150(H) 148(H)  Potassium 3.5 - 5.1 mmol/L 4.5 3.3(L) 3.1(L)  Chloride 101 - 111 mmol/L 113(H) 111 109  CO2 22 - 32 mmol/L 30 29 33(H)  Calcium 8.9 - 10.3 mg/dL 7.9(L) 7.5(L) 7.4(L)  Total Protein 6.5 - 8.1 g/dL - 4.8(L) -  Total Bilirubin 0.3 - 1.2 mg/dL - 1.2 -  Alkaline Phos 38 - 126 U/L - 87 -  AST 15 - 41 U/L - 37 -  ALT 17 - 63 U/L - 39 -    CBC Latest Ref Rng & Units 11/08/2016 11/07/2016 11/06/2016  WBC 4.0 - 10.5 K/uL 10.6(H) 7.9 8.4  Hemoglobin 13.0 - 17.0 g/dL 10.0(L) 8.9(L) 9.2(L)   Hematocrit 39.0 - 52.0 % 35.3(L) 30.7(L) 30.4(L)  Platelets 150 - 400 K/uL 302 301 309    ABG    Component Value Date/Time   PHART 7.511 (H) 11/04/2016 1950   PCO2ART 43.5 11/04/2016 1950   PO2ART 274.0 (H) 11/04/2016 1950   HCO3 34.4 (H) 11/04/2016 1950   TCO2 36 11/04/2016 1950   ACIDBASEDEF 1.0 10/27/2016 1125   O2SAT 63.2 11/06/2016 0410    CBG (last 3)   Recent Labs  11/08/16 0014 11/08/16 0501 11/08/16 0828  GLUCAP 127* 166* 164*    Imaging: Dg Chest Port 1 View  Result Date: 11/08/2016 CLINICAL DATA:  Low O2 sats EXAM: PORTABLE CHEST 1 VIEW COMPARISON:  11/07/2016 FINDINGS: Bilateral chest tubes remain in place, unchanged. Endotracheal tube and NG tube are also unchanged as well as left PICC line. No pneumothorax. Bibasilar opacities, left greater than right, likely atelectasis. Small left effusion appear IMPRESSION: No pneumothorax. Bibasilar atelectasis with small left effusion. No real change. Electronically Signed   By: Rolm Baptise M.D.   On: 11/08/2016 10:04   Dg Chest Port 1 View  Result Date: 11/07/2016 CLINICAL DATA:  Shortness of breath, hila EXAM: PORTABLE CHEST 1 VIEW COMPARISON:  11/05/2016 FINDINGS: Support devices are unchanged. Bilateral chest tubes remain in  place. No visible pneumothorax. Cardiomegaly with vascular congestion and bibasilar atelectasis, improving since prior study. IMPRESSION: Improving bibasilar atelectasis. Cardiomegaly, vascular congestion. No pneumothorax. Electronically Signed   By: Rolm Baptise M.D.   On: 11/07/2016 07:49    STUDIES:  12.5 dopplers lowers>>>neg 12/6 dopplers arms >>>neg 12/7 abdo us>>>Cholelithiasis without sonographic evidence for acute cholecystitis. No biliary dilatation.Increased hepatic echogenicity, suggestive of steatosis. Ascites. 12/7 echo>>>60-65%, rv poor images but appeared wnl  CULTURES: C diff 12/12 >> positive Blood 12/12 >> Rt thigh wound 12/12 >> neg   ANTIBIOTICS:  Acyclovir 12/10  >> PO vancomycin 12/12 >> Flagyl 12/13 >>  SIGNIFICANT EVENTS: 11/30 surgery, hypoxia 12/4 closed chest 12/7 high fevers, LFt up  LINES/TUBES: 11/30 ETT>> 12/04 picc left >>> 11/30 CT x 4>> R CT x 2 12/16>>>  ASSESSMENT / PLAN:  Acute hypoxic respiratory failure after CABG with MVR. - pressure support wean as tolerated - tentative plan for trach 12/18 with TCTS - f/u CXR  CAD s/p CABG, MVR, aortic root replacement. A fib. Rt atrial clot. - PO amiodarone, ASA - lasix gtt per TCTS/heart failure  - heparin gtt per pharmacy   B/l hand/feet ischemia. - will need further therapy when more stable  C diff colitis. - flagyl, enteral vancomycin per ID  Anemia of critical illness. - f/u CBC  DM. - SSI with lantus  Acute metabolic encephalopathy. - RASS goal 0 to -1 - continue precedex  DVT prophylaxis - heparin gtt SUP - Pepcid Nutrition - tubes feeds Goals of care - Full code  Resolved problems >> DIC  No family available 12/17   Tammy Parrett NP-C  [facility]  [phone]   11/08/2016   BP low.  Back on full vent support.  Sedated.  HR regular.  Faint crackles.  Abd soft.  1+ edema.  Ischemic changes in fingers/toes  WBC 10.6, Hb 10, Na 150, Creatinine 1.08  Assessment/plan:  Acute respiratory failure. - tentative plan for trach 12/18 with TCTS    Hypervolemia. - decrease lasix gtt in setting of lower BP and Na up  C diff. - continue abx  CC time by me independent of APP time 31 minutes  Chesley Mires, MD [place] 11/08/2016, 11:05 AM Pager:  [phone] After 3pm call: [phone]

## 2016-11-09 ENCOUNTER — Inpatient Hospital Stay (HOSPITAL_COMMUNITY): Payer: Medicare Other

## 2016-11-09 ENCOUNTER — Encounter (HOSPITAL_COMMUNITY): Payer: Self-pay | Admitting: Certified Registered Nurse Anesthetist

## 2016-11-09 ENCOUNTER — Inpatient Hospital Stay (HOSPITAL_COMMUNITY): Payer: Medicare Other | Admitting: Certified Registered Nurse Anesthetist

## 2016-11-09 ENCOUNTER — Encounter (HOSPITAL_COMMUNITY)
Admission: RE | Disposition: A | Discharge status: Nursing Home (Includes ICF) | Payer: Self-pay | Source: Ambulatory Visit | Attending: Thoracic Surgery (Cardiothoracic Vascular Surgery)

## 2016-11-09 DIAGNOSIS — J9801 Acute bronchospasm: Secondary | ICD-10-CM

## 2016-11-09 DIAGNOSIS — Z9689 Presence of other specified functional implants: Secondary | ICD-10-CM

## 2016-11-09 DIAGNOSIS — Z8679 Personal history of other diseases of the circulatory system: Secondary | ICD-10-CM

## 2016-11-09 HISTORY — PX: TRACHEOSTOMY TUBE PLACEMENT: SHX814

## 2016-11-09 HISTORY — PX: VIDEO BRONCHOSCOPY: SHX5072

## 2016-11-09 LAB — CBC
HCT: 31.3 % — ABNORMAL LOW (ref 39.0–52.0)
Hemoglobin: 8.9 g/dL — ABNORMAL LOW (ref 13.0–17.0)
MCH: 27.9 pg (ref 26.0–34.0)
MCHC: 28.4 g/dL — AB (ref 30.0–36.0)
MCV: 98.1 fL (ref 78.0–100.0)
Platelets: 272 10*3/uL (ref 150–400)
RBC: 3.19 MIL/uL — ABNORMAL LOW (ref 4.22–5.81)
RDW: 18 % — AB (ref 11.5–15.5)
WBC: 8.8 10*3/uL (ref 4.0–10.5)

## 2016-11-09 LAB — COMPREHENSIVE METABOLIC PANEL
ALT: 27 U/L (ref 17–63)
ANION GAP: 5 (ref 5–15)
AST: 22 U/L (ref 15–41)
Albumin: 1.6 g/dL — ABNORMAL LOW (ref 3.5–5.0)
Alkaline Phosphatase: 73 U/L (ref 38–126)
BUN: 44 mg/dL — ABNORMAL HIGH (ref 6–20)
CALCIUM: 7.6 mg/dL — AB (ref 8.9–10.3)
CHLORIDE: 114 mmol/L — AB (ref 101–111)
CO2: 31 mmol/L (ref 22–32)
Creatinine, Ser: 0.96 mg/dL (ref 0.61–1.24)
GFR calc non Af Amer: >60 mL/min (ref >60)
Glucose, Bld: 94 mg/dL (ref 65–99)
POTASSIUM: 4.1 mmol/L (ref 3.5–5.1)
SODIUM: 150 mmol/L — AB (ref 135–145)
Total Bilirubin: 0.9 mg/dL (ref 0.3–1.2)
Total Protein: 4.9 g/dL — ABNORMAL LOW (ref 6.5–8.1)

## 2016-11-09 LAB — GLUCOSE, CAPILLARY
GLUCOSE-CAPILLARY: 88 mg/dL (ref 65–99)
GLUCOSE-CAPILLARY: 127 mg/dL — AB (ref 65–99)
GLUCOSE-CAPILLARY: 193 mg/dL — AB (ref 65–99)
Glucose-Capillary: 155 mg/dL — ABNORMAL HIGH (ref 65–99)
Glucose-Capillary: 77 mg/dL (ref 65–99)

## 2016-11-09 SURGERY — CREATION, TRACHEOSTOMY
Anesthesia: General | Site: Throat

## 2016-11-09 MED ORDER — ROCURONIUM BROMIDE 100 MG/10ML IV SOLN
INTRAVENOUS | Status: DC | PRN
  Administered 2016-11-09: 40 mg via INTRAVENOUS

## 2016-11-09 MED ORDER — MIDAZOLAM HCL 5 MG/5ML IJ SOLN
INTRAMUSCULAR | Status: DC | PRN
  Administered 2016-11-09: 2 mg via INTRAVENOUS

## 2016-11-09 MED ORDER — PHENYLEPHRINE HCL 10 MG/ML IJ SOLN
INTRAMUSCULAR | Status: DC | PRN
  Administered 2016-11-09: 80 ug via INTRAVENOUS
  Administered 2016-11-09: 40 ug via INTRAVENOUS
  Administered 2016-11-09 (×2): 80 ug via INTRAVENOUS

## 2016-11-09 MED ORDER — HEPARIN (PORCINE) IN NACL 100-0.45 UNIT/ML-% IJ SOLN
1950.0000 [IU]/h | INTRAMUSCULAR | Status: DC
  Administered 2016-11-09: 1950 [IU]/h via INTRAVENOUS
  Administered 2016-11-10 (×2): 2100 [IU]/h via INTRAVENOUS
  Administered 2016-11-11: 1950 [IU]/h via INTRAVENOUS
  Filled 2016-11-09 (×3): qty 250

## 2016-11-09 MED ORDER — FENTANYL CITRATE (PF) 250 MCG/5ML IJ SOLN
INTRAMUSCULAR | Status: AC
  Filled 2016-11-09: qty 5

## 2016-11-09 MED ORDER — 0.9 % SODIUM CHLORIDE (POUR BTL) OPTIME
TOPICAL | Status: DC | PRN
  Administered 2016-11-09: 1000 mL

## 2016-11-09 MED ORDER — PROPOFOL 10 MG/ML IV BOLUS
INTRAVENOUS | Status: DC | PRN
  Administered 2016-11-09: 50 mg via INTRAVENOUS

## 2016-11-09 MED ORDER — ACETAZOLAMIDE SODIUM 500 MG IJ SOLR
250.0000 mg | Freq: Three times a day (TID) | INTRAMUSCULAR | Status: DC
Start: 2016-11-09 — End: 2016-11-09

## 2016-11-09 MED ORDER — HEPARIN (PORCINE) IN NACL 100-0.45 UNIT/ML-% IJ SOLN
INTRAMUSCULAR | Status: AC
  Filled 2016-11-09: qty 250

## 2016-11-09 MED ORDER — FUROSEMIDE 10 MG/ML IJ SOLN
10.0000 mg/h | INTRAVENOUS | Status: DC
  Administered 2016-11-09 – 2016-11-10 (×2): 10 mg/h via INTRAVENOUS
  Filled 2016-11-09 (×6): qty 25

## 2016-11-09 MED ORDER — LACTATED RINGERS IV SOLN
INTRAVENOUS | Status: DC | PRN
  Administered 2016-11-09: 08:00:00 via INTRAVENOUS

## 2016-11-09 MED ORDER — PROPOFOL 10 MG/ML IV BOLUS
INTRAVENOUS | Status: AC
  Filled 2016-11-09: qty 20

## 2016-11-09 MED ORDER — MIDAZOLAM HCL 2 MG/2ML IJ SOLN
INTRAMUSCULAR | Status: AC
  Filled 2016-11-09: qty 2

## 2016-11-09 SURGICAL SUPPLY — 34 items
BLADE SURG 10 STRL SS (BLADE) ×3 IMPLANT
BLADE SURG 11 STRL SS (BLADE) ×3 IMPLANT
CANISTER SUCTION 2500CC (MISCELLANEOUS) ×3 IMPLANT
COVER SURGICAL LIGHT HANDLE (MISCELLANEOUS) ×6 IMPLANT
ELECT REM PT RETURN 9FT ADLT (ELECTROSURGICAL) ×3
ELECTRODE REM PT RTRN 9FT ADLT (ELECTROSURGICAL) ×2 IMPLANT
GAUZE SPONGE 4X4 16PLY XRAY LF (GAUZE/BANDAGES/DRESSINGS) ×3 IMPLANT
GLOVE BIO SURGEON STRL SZ 6 (GLOVE) ×1 IMPLANT
GLOVE BIO SURGEON STRL SZ 6.5 (GLOVE) ×2 IMPLANT
GLOVE ORTHO TXT STRL SZ7.5 (GLOVE) ×6 IMPLANT
GOWN STRL REUS W/ TWL LRG LVL3 (GOWN DISPOSABLE) ×4 IMPLANT
GOWN STRL REUS W/TWL LRG LVL3 (GOWN DISPOSABLE) ×12
HEMOSTAT SURGICEL 2X14 (HEMOSTASIS) IMPLANT
HOLDER TRACH TUBE VELCRO 19.5 (MISCELLANEOUS) ×3 IMPLANT
INTRODUCER TRACH BLUE RHINO 8F (TUBING) ×1 IMPLANT
KIT BASIN OR (CUSTOM PROCEDURE TRAY) ×3 IMPLANT
KIT ROOM TURNOVER OR (KITS) ×3 IMPLANT
KIT SUCTION CATH 14FR (SUCTIONS) ×3 IMPLANT
NEEDLE 22X1 1/2 (OR ONLY) (NEEDLE) IMPLANT
NS IRRIG 1000ML POUR BTL (IV SOLUTION) ×3 IMPLANT
PACK EENT II TURBAN DRAPE (CUSTOM PROCEDURE TRAY) ×3 IMPLANT
PAD ARMBOARD 7.5X6 YLW CONV (MISCELLANEOUS) ×6 IMPLANT
PENCIL BUTTON HOLSTER BLD 10FT (ELECTRODE) ×3 IMPLANT
SPONGE DRAIN TRACH 4X4 STRL 2S (GAUZE/BANDAGES/DRESSINGS) ×3 IMPLANT
SUT PROLENE 3 0 RB 1 (SUTURE) IMPLANT
SUT SILK 2 0 TIES 10X30 (SUTURE) ×3 IMPLANT
SUT SILK 3 0 TIES 10X30 (SUTURE) IMPLANT
SYR BULB IRRIGATION 50ML (SYRINGE) ×3 IMPLANT
SYR CONTROL 10ML LL (SYRINGE) IMPLANT
SYRINGE 10CC LL (SYRINGE) ×3 IMPLANT
TOWEL OR 17X24 6PK STRL BLUE (TOWEL DISPOSABLE) ×3 IMPLANT
TOWEL OR 17X26 10 PK STRL BLUE (TOWEL DISPOSABLE) ×3 IMPLANT
TUBE CONNECTING 12X1/4 (SUCTIONS) ×3 IMPLANT
WATER STERILE IRR 1000ML POUR (IV SOLUTION) ×3 IMPLANT

## 2016-11-09 NOTE — Plan of Care (Signed)
Problem: Respiratory: Goal: Respiratory status will improve Outcome: Not Progressing Patient intubated.

## 2016-11-09 NOTE — Plan of Care (Signed)
Problem: Pain Management: Goal: Pain level will decrease Outcome: Progressing Patient complaining of little to no pain on current pain medication.

## 2016-11-09 NOTE — Anesthesia Preprocedure Evaluation (Signed)
Anesthesia Evaluation  Patient identified by MRN, date of birth, ID bandGeneral Assessment Comment:sedated  Reviewed: Allergy & Precautions, NPO status , Patient's Chart, lab work & pertinent test results, reviewed documented beta blocker date and time , Unable to perform ROS - Chart review only  History of Anesthesia Complications Negative for: history of anesthetic complications  Airway Mallampati: Intubated       Dental  (+) Dental Advisory Given   Pulmonary former smoker,  Vent dependency post cardiac surgery   breath sounds clear to auscultation       Cardiovascular hypertension, Pt. on medications and Pt. on home beta blockers + angina + CAD and + Peripheral Vascular Disease  + Valvular Problems/Murmurs AS, AI and MR  Rhythm:Regular  S/p AVR/MVR, ascending repair   Neuro/Psych  Neuromuscular disease negative psych ROS   GI/Hepatic Neg liver ROS, GERD  Medicated,  Endo/Other  diabetes, Type 2, Insulin Dependent  Renal/GU negative Renal ROS     Musculoskeletal  (+) Arthritis ,   Abdominal   Peds  Hematology negative hematology ROS (+)   Anesthesia Other Findings   Reproductive/Obstetrics                             Anesthesia Physical Anesthesia Plan  ASA: IV  Anesthesia Plan: General   Post-op Pain Management:    Induction: Inhalational  Airway Management Planned: Oral ETT  Additional Equipment:   Intra-op Plan:   Post-operative Plan: Post-operative intubation/ventilation  Informed Consent: I have reviewed the patients History and Physical, chart, labs and discussed the procedure including the risks, benefits and alternatives for the proposed anesthesia with the patient or authorized representative who has indicated his/her understanding and acceptance.   History available from chart only  Plan Discussed with: CRNA and Surgeon  Anesthesia Plan Comments:          Anesthesia Quick Evaluation

## 2016-11-09 NOTE — Progress Notes (Signed)
PCCM Progress Note  ADMISSION DATE:  10/20/2016 CONSULTATION DATE: 10/23/2016 REFERRING MD: Ricard Dillon  CHIEF COMPLAINT:  Chest pain  HISTORY OF PRESENT ILLNESS:   70 yo former smoker with known CAD and ascending aorta aneurysm, who was taken to OR 11/30 for cabg x 3, aortic root replacement, MVR replacement, thoracic cavity left open and had extended pump time in OR.  SUBJECTIVE:  Trach done on 12/18, no events overnight.  VITAL SIGNS: BP (!) 77/64   Pulse (!) 101   Temp 99 F (37.2 C)   Resp 13   Ht 5\' 9"  (1.753 m)   Wt 115.1 kg (253 lb 12 oz)   SpO2 100%   BMI 37.47 kg/m   HEMODYNAMICS: CVP:  [12 mmHg-15 mmHg] 15 mmHg  INTAKE / OUTPUT: I/O last 3 completed shifts: In: 6388.5 [I.V.:3168.5; NG/GT:3220] Out: MU:[id number]; Emesis/NG output:75; Stool:400; Chest Tube:380]  PHYSICAL EXAMINATION: General: ill appearing male, NAD  Neuro: RASS -1 not follow commands , sedated  HEENT:  ETT in place Cardiovascular: irregular, chest closed Lungs:  No wheeze Abdomen:  Soft, non tender Ext: 1+ edema, extremities cool Skin: Ischemic changes in fingers/toes  b/l  CMP Latest Ref Rng & Units 11/09/2016 11/08/2016 11/07/2016  Glucose 65 - 99 mg/dL 94 139(H) 115(H)  BUN 6 - 20 mg/dL 44(H) 43(H) 43(H)  Creatinine 0.61 - 1.24 mg/dL 0.96 1.08 1.02  Sodium 135 - 145 mmol/L 150(H) 150(H) 150(H)  Potassium 3.5 - 5.1 mmol/L 4.1 4.5 3.3(L)  Chloride 101 - 111 mmol/L 114(H) 113(H) 111  CO2 22 - 32 mmol/L 31 30 29   Calcium 8.9 - 10.3 mg/dL 7.6(L) 7.9(L) 7.5(L)  Total Protein 6.5 - 8.1 g/dL 4.9(L) - 4.8(L)  Total Bilirubin 0.3 - 1.2 mg/dL 0.9 - 1.2  Alkaline Phos 38 - 126 U/L 73 - 87  AST 15 - 41 U/L 22 - 37  ALT 17 - 63 U/L 27 - 39    CBC Latest Ref Rng & Units 11/09/2016 11/08/2016 11/07/2016  WBC 4.0 - 10.5 K/uL 8.8 10.6(H) 7.9  Hemoglobin 13.0 - 17.0 g/dL 8.9(L) 10.0(L) 8.9(L)  Hematocrit 39.0 - 52.0 % 31.3(L) 35.3(L) 30.7(L)  Platelets 150 - 400 K/uL 272 302 301    ABG     Component Value Date/Time   PHART 7.511 (H) 11/04/2016 1950   PCO2ART 43.5 11/04/2016 1950   PO2ART 274.0 (H) 11/04/2016 1950   HCO3 34.4 (H) 11/04/2016 1950   TCO2 36 11/04/2016 1950   ACIDBASEDEF 1.0 10/27/2016 1125   O2SAT 63.2 11/06/2016 0410    CBG (last 3)   Recent Labs  11/08/16 1957 11/08/16 2321 11/09/16 0431  GLUCAP 107* 155* 88    Imaging: Dg Chest Port 1 View  Result Date: 11/09/2016 CLINICAL DATA:  Atelectasis.  Intubated patient. EXAM: PORTABLE CHEST 1 VIEW COMPARISON:  11/08/2016 FINDINGS: Endotracheal tube is 4.8 cm above the carina. Left arm PICC line tip in the upper SVC region. Nasogastric tube extends into the abdomen. Decreased lung volumes compared to the prior examination. Increased parenchymal densities at left lung base. Increased densities at the right lung base most likely associated with volume loss and atelectasis. Cardiac silhouette remains mildly enlarged but stable. Again noted are median sternotomy wires. Bilateral chest drains appear stable. Negative for a pneumothorax. IMPRESSION: Decreased lung volumes with bibasilar atelectasis. Again noted are parenchymal densities in the left lower lung which could also represent airspace disease. Support apparatuses as described.  Negative for pneumothorax. Electronically Signed   By: Markus Daft  M.D.   On: 11/09/2016 07:47   Dg Chest Port 1 View  Result Date: 11/08/2016 CLINICAL DATA:  Low O2 sats EXAM: PORTABLE CHEST 1 VIEW COMPARISON:  11/07/2016 FINDINGS: Bilateral chest tubes remain in place, unchanged. Endotracheal tube and NG tube are also unchanged as well as left PICC line. No pneumothorax. Bibasilar opacities, left greater than right, likely atelectasis. Small left effusion appear IMPRESSION: No pneumothorax. Bibasilar atelectasis with small left effusion. No real change. Electronically Signed   By: Rolm Baptise M.D.   On: 11/08/2016 10:04    STUDIES:  12.5 dopplers lowers>>>neg 12/6 dopplers arms  >>>neg 12/7 abdo us>>>Cholelithiasis without sonographic evidence for acute cholecystitis. No biliary dilatation.Increased hepatic echogenicity, suggestive of steatosis. Ascites. 12/7 echo>>>60-65%, rv poor images but appeared wnl  CULTURES: C diff 12/12 >> positive Blood 12/12 >> Rt thigh wound 12/12 >> neg   ANTIBIOTICS:  Acyclovir 12/10 >> PO vancomycin 12/12 >> Flagyl 12/13 >>  SIGNIFICANT EVENTS: 11/30 surgery, hypoxia 12/4 closed chest 12/7 high fevers, LFt up  LINES/TUBES: 11/30 ETT>>12/18 Trach (CVTS) 12/18>>> 12/04 picc left >>> 11/30 CT x 4>> R CT x 2 12/16>>>  ASSESSMENT / PLAN:  Acute hypoxic respiratory failure after CABG with MVR. - Full vent support since trach is today - Titrate O2 for sat of 88-92% - F/u CXR and ABG  CAD s/p CABG, MVR, aortic root replacement. A fib. Rt atrial clot. - PO amiodarone, ASA - Lasix gtt at 10 mg/hr. - Heparin gtt per pharmacy, to be restarted when ok with CVTS  B/l hand/feet ischemia. - Will need further therapy when more stable  C diff colitis. - Flagyl, enteral vancomycin per ID  Anemia of critical illness. - F/u CBC  DM. - SSI with lantus  Acute metabolic encephalopathy. - RASS goal 0 to -1 - Continue precedex  DVT prophylaxis - heparin gtt SUP - Pepcid Nutrition - tubes feeds Goals of care - Full code  Resolved problems >> DIC  No family available 12/18  The patient is critically ill with multiple organ systems failure and requires high complexity decision making for assessment and support, frequent evaluation and titration of therapies, application of advanced monitoring technologies and extensive interpretation of multiple databases.   Critical Care Time devoted to patient care services described in this note is  35  Minutes. This time reflects time of care of this signee Dr Jennet Maduro. This critical care time does not reflect procedure time, or teaching time or supervisory time of PA/NP/Med  student/Med Resident etc but could involve care discussion time.  Rush Farmer, M.D. [facility]. Pager: [phone]. After hours pager: [phone].

## 2016-11-09 NOTE — Plan of Care (Signed)
Problem: Respiratory: Goal: Ability to tolerate decreased levels of ventilator support will improve Outcome: Not Progressing Patient not tolerating weaning during day shift. Plan for trach 12/18

## 2016-11-09 NOTE — Anesthesia Postprocedure Evaluation (Signed)
Anesthesia Post Note  Patient: Scott [name]  Procedure(s) Performed: Procedure(s) (LRB): TRACHEOSTOMY (N/A) VIDEO BRONCHOSCOPY (N/A)  Patient location during evaluation: ICU Anesthesia Type: General Level of consciousness: sedated Pain management: pain level controlled Vital Signs Assessment: post-procedure vital signs reviewed and stable Respiratory status: patient connected to tracheostomy mask oxygen Cardiovascular status: stable Postop Assessment: no signs of nausea or vomiting Anesthetic complications: no    Last Vitals:  Vitals:   11/09/16 1100 11/09/16 1136  BP: (!) 77/64 (!) 77/64  Pulse: (!) 101 (!) 102  Resp: 13 12  Temp:      Last Pain:  Vitals:   11/09/16 0400  TempSrc: Core (Comment)                 Eleaner Dibartolo

## 2016-11-09 NOTE — Plan of Care (Signed)
Problem: Respiratory: Goal: Levels of oxygenation will improve Outcome: Not Progressing Patient requiring more oxygenation. Plan for trach 12/18.

## 2016-11-09 NOTE — Care Management Important Message (Signed)
Important Message  Patient Details  Name: Scott [name] MRN: [id number] Date of Birth: Feb 23, 1946   Medicare Important Message Given:  Yes    Nathen May 11/09/2016, 10:48 AM

## 2016-11-09 NOTE — Progress Notes (Signed)
Trach care done per RRT, site was soiled bloody. Site cleansed with NS and sterile water, dried and clean dressing applied. New IC inserted and locked back into place no distress or complications noted. Pt tolerated it well.

## 2016-11-09 NOTE — Progress Notes (Signed)
Patient ID: Scott [name], male   DOB: [birth year], 70 y.o.   MRN: [id number]     Advanced Heart Failure Rounding Note  Referring Physician: Dr Roxy Manns Primary Physician: Dr Quintin Alto Primary Cardiologist:  Dr Percival Spanish   Reason for Consultation: Heart Failure   Subjective:    Admitted with chest pain. RHC//LHC with 3 vessel disease. Taken to the OR 11/30 for cabg x 3, repair of thoracic ascending aneurysm, AVR bioprosthetic,  MVR, and open chest with VAC placement.    Chest successfully closed 10/26/16.  Off milrinone and phenylephrine now.      S/p tracheostomy am of 11/09/16  CVP 10-11 currently. Remains on lasix 10 mg/hr. Out 1 L. Weight shows up 4 lbs.      HR stable in 80-90s on po amiodarone, in aFib/Flutter.    Suspect DIC and not HIT.  Now back on heparin gtt with RA clot and atrial fibrillation. HIT Ab and SRA negative. Heparin on hold this am with procedure.   ID following. Remains on oral vanc and IV flagyl currently. TMax 101.3 (yesterday)  TEE 10/26/16 EF 40-45%, small cavity size, flattened/hypokinetic septum, s/p MV repair with mild MR, mild to moderately decreased RV systolic function. + RA thrombus.   Limited echo 10/29/16 LVEF 60-65%, unable to comment on AVR structure or function with poor windows.   Upper and lower extremity doppler studies: No DVTs  ABIs (12/8): Unable to doppler PTs, or get TBIs.   Objective:   Weight Range: 253 lb 12 oz (115.1 kg) Body mass index is 37.47 kg/m.   Vital Signs:   Temp:  [98.8 F (37.1 C)-101.3 F (38.5 C)] 99 F (37.2 C) (12/18 0630) Pulse Rate:  [86-108] 102 (12/18 1136) Resp:  [12-24] 12 (12/18 1136) BP: (70-136)/(58-78) 77/64 (12/18 1136) SpO2:  [96 %-100 %] 100 % (12/18 1136) FiO2 (%):  [50 %-100 %] 60 % (12/18 1136) Weight:  [253 lb 12 oz (115.1 kg)] 253 lb 12 oz (115.1 kg) (12/18 0436) Last BM Date: 11/09/16  Weight change: Filed Weights   11/07/16 0500 11/08/16 0500 11/09/16 0436  Weight: 252 lb 3.3 oz  (114.4 kg) 249 lb 9 oz (113.2 kg) 253 lb 12 oz (115.1 kg)    Intake/Output:   Intake/Output Summary (Last 24 hours) at 11/09/16 1141 Last data filed at 11/09/16 1100  Gross per 24 hour  Intake             3287 ml  Output             3665 ml  Net             -378 ml     Physical Exam: CVP 11-12 General: Tracheostomy in place.  Sedated.   HEENT: + Trach Neck: supple. JVP 11-12. Carotids 2+ bilat; no bruits. No thyromegaly or nodule noted.    Cor: PMI nondisplaced. Mildly tachy, regular.  No M/G/R noted. Chest tubes in place.  Lungs: Scattered rhonchi, Mechanical breathing sounds present.   Abdomen: soft, NT, ND, no HSM. No bruits or masses. +BS  Extremities: no clubbing, rash. 1-2+ edema 1/2 way to knees. Digital gangrene. Fingers > Toes. Unable to doppler pedal pulses.  Neuro: Trachestomy in place. Remains sedated from procedure.  GU: Foley in place  Telemetry: Reviewed, Currently afib-flutter 70-80s  Labs: CBC  Recent Labs  11/08/16 0332 11/09/16 0432  WBC 10.6* 8.8  HGB 10.0* 8.9*  HCT 35.3* 31.3*  MCV 97.5 98.1  PLT 302 272   Basic  Metabolic Panel  Recent Labs  11/08/16 0332 11/09/16 0432  NA 150* 150*  K 4.5 4.1  CL 113* 114*  CO2 30 31  GLUCOSE 139* 94  BUN 43* 44*  CREATININE 1.08 0.96  CALCIUM 7.9* 7.6*   Liver Function Tests  Recent Labs  11/07/16 0440 11/09/16 0432  AST 37 22  ALT 39 27  ALKPHOS 87 73  BILITOT 1.2 0.9  PROT 4.8* 4.9*  ALBUMIN 1.6* 1.6*   No results for input(s): LIPASE, AMYLASE in the last 72 hours. Cardiac Enzymes No results for input(s): CKTOTAL, CKMB, CKMBINDEX, TROPONINI in the last 72 hours.  BNP: BNP (last 3 results) No results for input(s): BNP in the last 8760 hours.  ProBNP (last 3 results) No results for input(s): PROBNP in the last 8760 hours.   D-Dimer No results for input(s): DDIMER in the last 72 hours. Hemoglobin A1C No results for input(s): HGBA1C in the last 72 hours. Fasting Lipid Panel No  results for input(s): CHOL, HDL, LDLCALC, TRIG, CHOLHDL, LDLDIRECT in the last 72 hours. Thyroid Function Tests No results for input(s): TSH, T4TOTAL, T3FREE, THYROIDAB in the last 72 hours.  Invalid input(s): FREET3  Other results:  Imaging/Studies:  Dg Chest Port 1 View  Result Date: 11/09/2016 CLINICAL DATA:  Atelectasis.  Intubated patient. EXAM: PORTABLE CHEST 1 VIEW COMPARISON:  11/08/2016 FINDINGS: Endotracheal tube is 4.8 cm above the carina. Left arm PICC line tip in the upper SVC region. Nasogastric tube extends into the abdomen. Decreased lung volumes compared to the prior examination. Increased parenchymal densities at left lung base. Increased densities at the right lung base most likely associated with volume loss and atelectasis. Cardiac silhouette remains mildly enlarged but stable. Again noted are median sternotomy wires. Bilateral chest drains appear stable. Negative for a pneumothorax. IMPRESSION: Decreased lung volumes with bibasilar atelectasis. Again noted are parenchymal densities in the left lower lung which could also represent airspace disease. Support apparatuses as described.  Negative for pneumothorax. Electronically Signed   By: Markus Daft M.D.   On: 11/09/2016 07:47   Dg Chest Port 1 View  Result Date: 11/08/2016 CLINICAL DATA:  Low O2 sats EXAM: PORTABLE CHEST 1 VIEW COMPARISON:  11/07/2016 FINDINGS: Bilateral chest tubes remain in place, unchanged. Endotracheal tube and NG tube are also unchanged as well as left PICC line. No pneumothorax. Bibasilar opacities, left greater than right, likely atelectasis. Small left effusion appear IMPRESSION: No pneumothorax. Bibasilar atelectasis with small left effusion. No real change. Electronically Signed   By: Rolm Baptise M.D.   On: 11/08/2016 10:04    Medications:     Scheduled Medications: . acyclovir  200 mg Oral QID  . amiodarone  400 mg Per Tube BID  . aspirin EC  325 mg Oral Daily   Or  . aspirin  324 mg Per  Tube Daily  . chlorhexidine gluconate (MEDLINE KIT)  15 mL Mouth Rinse BID  . famotidine  20 mg Per Tube Q12H  . feeding supplement (PRO-STAT SUGAR FREE 64)  30 mL Per Tube BID  . free water  300 mL Per Tube Q6H  . insulin aspart  0-24 Units Subcutaneous Q4H  . insulin glargine  15 Units Subcutaneous BID  . mouth rinse  15 mL Mouth Rinse QID  . metroNIDAZOLE  500 mg Per Tube TID  . potassium chloride  40 mEq Per Tube Q6H  . sodium chloride flush  3 mL Intravenous Q12H  . vancomycin  500 mg Per Tube Q6H  Infusions: . sodium chloride 10 mL/hr at 11/09/16 1100  . dexmedetomidine 1.199 mcg/kg/hr (11/09/16 1100)  . fentaNYL infusion INTRAVENOUS 300 mcg/hr (11/09/16 1100)  . furosemide (LASIX) infusion 10 mg/hr (11/09/16 1100)  . phenylephrine (NEO-SYNEPHRINE) Adult infusion Stopped (11/03/16 1800)    PRN Medications: acetaminophen (TYLENOL) oral liquid 160 mg/5 mL, Gerhardt's butt cream, ipratropium-albuterol, metoprolol, midazolam, morphine injection, ondansetron (ZOFRAN) IV, sodium chloride flush   Assessment/Plan   Mr Vasco is 70 year old admitted with CP. Complex operation this admission.  CABG x 3 + bioprosthetic AVR for severe bicuspid AS + ascending aorta replacement + MV repair for flail P2.  MV repair complicated by severe SAM, ended up having to remove posterior annuloplasty band.  Chest closed on 12/3.   1. CAD:  10/22/16 S/P CABG x3 with chest left open.  Chest successfully closed am of 10/26/16. - Continue ASA - Will need statin eventually now that LFTs down.  2. Severe Aortic Stenosis: 11/302017 S/P AVR bioprostheic  3. Mitral regurgitation: Partial flail leaflet with MV repair.  Developed SAM initially after repair and posterior annuloplasty ring had to be removed.  SAM resolved. 4. Acute on chronic systolic CHF:  TEE at time of chest closure showed EF 40-45%, small cavity size, flattened/hypokinetic septum, s/p MV repair with mild MR, mild to moderately decreased RV  systolic function, thrombus in RA. Repeat echo 12/7 with EF 60-65%.  Weaned off off milrinone and phenylephrine.   - CVP 10-11. Continue Lasix gtt at 10 mg/hr.  5. ID: ?Source of fever => ID following, do not think endocarditis.  - Concerned digital gangrene is a contributor.  - Currently on po vanc and IV flagyl.  - C difficile has been identified and is being treated.  6. Elevated LFTs: - Likely shock liver.  Now back down to WNL.  - Will need statin eventually.  - Abdominal US 10/29/16 with Cholelithiasis without sonographic evidence for acute cholecystitis. No biliary dilatation. Increased hepatic echogenicity, suggestive of steatosis. and Ascites.   7. Heme: HIT negative, suspect DIC. Platelet count has recovered.  - Now back on heparin gtt for RA thrombus and atrial fibrillation.  - Will need transition to warfarin eventually.  8. VDRF: CCM following Pulmonary edema present, ?PNA.  - S/p tracheostomy am of 11/09/16.  9. Atrial fibrillation/flutter:  - Rate controlled. Currently in 80s.     - Continue heparin gtt. Needs to be resume s/p procedure.  Per Psychologist, sport and exercise.  Eventually will go on warfarin.  - Continue amio 400 mg BID.   10. PAD: Finger gangrene, mottled toes.  Unable to doppler PT pulses or get TBIs.  May be source of fever.  Seen by Dr Sharol Given, eventually will need amputation of most digits at PIP joint.   Gearl Baratta, PA-C 11/09/2016 11:41 AM   Patient seen with PA, agree with the above note.  He got tracheostomy today.  BP running soft, 80s-90s.  Remains on Lasix gtt 10 mg/hr with CVP 10-11 (keeps him gently negative).  Creatinine stable, good UOP.  Would not restart pressor at this time. Restart heparin gtt when ok with surgery.   Loralie Champagne 11/09/2016   Advanced Heart Failure Team Pager [phone] (M-F; 7a - 4p)  Please contact [facility] for night-coverage after hours (4p -7a ) and weekends on [email or web address]

## 2016-11-09 NOTE — Op Note (Signed)
CARDIOTHORACIC SURGERY OPERATIVE NOTE  Date of Procedure:   11/09/2016  Preoperative Diagnosis:  Respiratory Failure  Postoperative Diagnosis:  same  Procedure:      Bronchoscopy with endobronchial lavage  Tracheostomy  Surgeon:    Valentina Gu. Roxy Manns, MD  Assistant:    Lilia Argue. Servando Snare, MD  Anesthesia:    Laurie Panda, MD    DETAILS OF THE OPERATIVE PROCEDURE  Patient is brought to the operating room on the above mentioned date and placed in the supine position on the operative table. General anesthesia is maintained and monitored under the care and direction of Dr. Ermalene Postin.  Flexible fiberoptic bronchoscopy is performed through the patient's existing endotracheal tube. There are copious thick airway secretions in the trachea but the distal airways appear clear and without significant inflammation. There is normal endobronchial anatomy.  Endobronchial washings are sent for routine culture and sensitivity.  The patient's anterior neck and chest is prepared and draped in a sterile manner. A small transverse incision is made approximately 2 fingerbreadths above the sternal notch. Incision is completed through the platysma muscle and the strap muscles were divided in the midline. The anterior surface of the trachea is identified. The existing endotracheal tube balloon is deflated. The trachea is cannulated with the Seldinger technique and a guidewire advanced under bronchoscopic visualization into the right mainstem bronchus. Serial dilators are passed over the guidewire. The existing endotracheal tube is pulled back and a 8 French tracheostomy tube passed without difficulty into the trachea. The dilator is removed, cannula placed, and distal balloon inflated. The tracheostomy is affixed to the ventilator circuit and end-tidal CO2 is verified.  The patient's oxygen saturations dropped temporarily during tracheostomy placement but returned to greater than 90% fairly rapidly with manual ventilation.  Dry sterile dressings are placed around the tracheostomy and the tracheostomy secured around the patient's neck. The existing oral gastric tube is removed.  The patient is transported back to the surgical intensive care unit in stable condition. There are no intraoperative consultations. Estimated blood loss was trivial.     Valentina Gu. Roxy Manns MD 11/09/2016 8:47 AM

## 2016-11-09 NOTE — Plan of Care (Signed)
Problem: Skin Integrity: Goal: Wound healing without signs and symptoms of infection Outcome: Progressing Incision healing well with minimal drainage from upper portion of incision. Cleansed with betadine.

## 2016-11-09 NOTE — Transfer of Care (Signed)
Immediate Anesthesia Transfer of Care Note  Patient: Scott [name]  Procedure(s) Performed: Procedure(s): TRACHEOSTOMY (N/A) VIDEO BRONCHOSCOPY (N/A)  Patient Location: SICU  Anesthesia Type:General  Level of Consciousness: Patient remains intubated per anesthesia plan  Airway & Oxygen Therapy: Patient remains intubated per anesthesia plan and Patient placed on Ventilator (see vital sign flow sheet for setting)  Post-op Assessment: Report given to RN and Post -op Vital signs reviewed and stable  Post vital signs: Reviewed and stable  Last Vitals:  Vitals:   11/09/16 0630 11/09/16 0700  BP: (!) 88/65 91/73  Pulse: (!) 103 (!) 103  Resp: 12 12  Temp: 37.2 C     Last Pain:  Vitals:   11/09/16 0400  TempSrc: Core (Comment)         Complications: No apparent anesthesia complications

## 2016-11-09 NOTE — Progress Notes (Signed)
Subjective: Pt with a cast to be in restraints   Antibiotics:  Anti-infectives    Start     Dose/Rate Route Frequency Ordered Stop   11/04/16 0845  metroNIDAZOLE (FLAGYL) 50 mg/ml oral suspension 500 mg     500 mg Per Tube 3 times daily 11/04/16 0831     11/03/16 2000  vancomycin (VANCOCIN) 50 mg/mL oral solution 500 mg     500 mg Per Tube Every 6 hours 11/03/16 1812 11/17/16 1759   11/01/16 1400  acyclovir (ZOVIRAX) 200 MG/5ML suspension SUSP 200 mg     200 mg Oral 4 times daily 11/01/16 1200     10/30/16 0927  anidulafungin (ERAXIS) 100 mg in sodium chloride 0.9 % 100 mL IVPB  Status:  Discontinued     100 mg over 90 Minutes Intravenous Every 24 hours 10/29/16 0927 10/31/16 1430   10/29/16 1000  meropenem (MERREM) 1 g in sodium chloride 0.9 % 100 mL IVPB  Status:  Discontinued     1 g 200 mL/hr over 30 Minutes Intravenous Every 8 hours 10/29/16 0931 10/31/16 1430   10/29/16 0930  anidulafungin (ERAXIS) 200 mg in sodium chloride 0.9 % 200 mL IVPB     200 mg over 180 Minutes Intravenous  Once 10/29/16 0927 10/29/16 1349   10/29/16 0600  vancomycin (VANCOCIN) 1,250 mg in sodium chloride 0.9 % 250 mL IVPB  Status:  Discontinued     1,250 mg 166.7 mL/hr over 90 Minutes Intravenous Every 24 hours 10/28/16 0900 11/02/16 0945   10/28/16 0600  vancomycin (VANCOCIN) IVPB 750 mg/150 ml premix  Status:  Discontinued     750 mg 150 mL/hr over 60 Minutes Intravenous Every 12 hours 10/27/16 1226 10/28/16 0900   10/27/16 1400  cefTRIAXone (ROCEPHIN) 2 g in dextrose 5 % 50 mL IVPB  Status:  Discontinued     2 g 100 mL/hr over 30 Minutes Intravenous Every 24 hours 10/27/16 1203 10/29/16 0907   10/27/16 1400  rifampin (RIFADIN) 300 mg in sodium chloride 0.9 % 100 mL IVPB  Status:  Discontinued     300 mg 200 mL/hr over 30 Minutes Intravenous Every 8 hours 10/27/16 1226 10/27/16 1426   10/27/16 1330  vancomycin (VANCOCIN) 2,000 mg in sodium chloride 0.9 % 500 mL IVPB     2,000 mg 250  mL/hr over 120 Minutes Intravenous  Once 10/27/16 1226 10/27/16 1451   10/27/16 1000  levofloxacin (LEVAQUIN) IVPB 750 mg  Status:  Discontinued     750 mg 100 mL/hr over 90 Minutes Intravenous Every 24 hours 10/26/16 1054 10/27/16 1203   10/26/16 1815  vancomycin (VANCOCIN) IVPB 1000 mg/200 mL premix     1,000 mg 200 mL/hr over 60 Minutes Intravenous  Once 10/26/16 1054 10/26/16 1752   10/26/16 0954  polymyxin B 500,000 Units, bacitracin 50,000 Units in sodium chloride irrigation 0.9 % 500 mL irrigation  Status:  Discontinued       As needed 10/26/16 0954 10/26/16 1030   10/26/16 0400  vancomycin (VANCOCIN) 1,500 mg in sodium chloride 0.9 % 250 mL IVPB     1,500 mg 125 mL/hr over 120 Minutes Intravenous To Surgery 10/25/16 1145 10/26/16 0925   10/26/16 0400  vancomycin (VANCOCIN) 1,000 mg in sodium chloride 0.9 % 1,000 mL irrigation      Irrigation To Surgery 10/25/16 1145 10/26/16 0953   10/26/16 0400  levofloxacin (LEVAQUIN) IVPB 500 mg     500 mg 100 mL/hr over  60 Minutes Intravenous To Surgery 10/25/16 1145 10/26/16 0925   10/23/16 1000  levofloxacin (LEVAQUIN) IVPB 750 mg     750 mg 100 mL/hr over 90 Minutes Intravenous Every 24 hours 10/22/16 2130 10/23/16 1047   10/23/16 0515  vancomycin (VANCOCIN) IVPB 1000 mg/200 mL premix     1,000 mg 200 mL/hr over 60 Minutes Intravenous  Once 10/22/16 2130 10/23/16 0600   10/22/16 2045  vancomycin (VANCOCIN) 1,250 mg in sodium chloride 0.9 % 250 mL IVPB  Status:  Discontinued     1,250 mg 166.7 mL/hr over 90 Minutes Intravenous To Surgery 10/22/16 2043 10/22/16 2130   10/22/16 2045  levofloxacin (LEVAQUIN) IVPB 500 mg  Status:  Discontinued     500 mg 100 mL/hr over 60 Minutes Intravenous To Surgery 10/22/16 2043 10/22/16 2130   10/22/16 0400  vancomycin (VANCOCIN) 1,250 mg in sodium chloride 0.9 % 250 mL IVPB     1,250 mg 166.7 mL/hr over 90 Minutes Intravenous To Surgery 10/21/16 1305 10/22/16 2247   10/22/16 0400  vancomycin (VANCOCIN)  1,000 mg in sodium chloride 0.9 % 1,000 mL irrigation      Irrigation To Surgery 10/21/16 1305 10/22/16 0819   10/22/16 0400  levofloxacin (LEVAQUIN) IVPB 500 mg     500 mg 100 mL/hr over 60 Minutes Intravenous To Surgery 10/21/16 1305 10/22/16 2114      Medications: Scheduled Meds: . acyclovir  200 mg Oral QID  . amiodarone  400 mg Per Tube BID  . aspirin EC  325 mg Oral Daily   Or  . aspirin  324 mg Per Tube Daily  . chlorhexidine gluconate (MEDLINE KIT)  15 mL Mouth Rinse BID  . famotidine  20 mg Per Tube Q12H  . feeding supplement (PRO-STAT SUGAR FREE 64)  30 mL Per Tube BID  . free water  300 mL Per Tube Q6H  . insulin aspart  0-24 Units Subcutaneous Q4H  . insulin glargine  15 Units Subcutaneous BID  . mouth rinse  15 mL Mouth Rinse QID  . metroNIDAZOLE  500 mg Per Tube TID  . potassium chloride  40 mEq Per Tube Q6H  . sodium chloride flush  3 mL Intravenous Q12H  . vancomycin  500 mg Per Tube Q6H   Continuous Infusions: . sodium chloride 10 mL/hr at 11/09/16 1100  . dexmedetomidine 1.199 mcg/kg/hr (11/09/16 1100)  . fentaNYL infusion INTRAVENOUS 300 mcg/hr (11/09/16 1100)  . furosemide (LASIX) infusion 10 mg/hr (11/09/16 1100)  . phenylephrine (NEO-SYNEPHRINE) Adult infusion Stopped (11/03/16 1800)   PRN Meds:.acetaminophen (TYLENOL) oral liquid 160 mg/5 mL, Gerhardt's butt cream, ipratropium-albuterol, metoprolol, midazolam, morphine injection, ondansetron (ZOFRAN) IV, sodium chloride flush    Objective: Weight change: 4 lb 3 oz (1.9 kg)  Intake/Output Summary (Last 24 hours) at 11/09/16 1125 Last data filed at 11/09/16 1100  Gross per 24 hour  Intake             3287 ml  Output             3665 ml  Net             -378 ml   Blood pressure (!) 77/64, pulse (!) 101, temperature 99 F (37.2 C), resp. rate 13, height '5\' 9"'  (1.753 m), weight 253 lb 12 oz (115.1 kg), SpO2 100 %. Temp:  [98.8 F (37.1 C)-101.3 F (38.5 C)] 99 F (37.2 C) (12/18 0630) Pulse  Rate:  [86-108] 101 (12/18 1100) Resp:  [12-24] 13 (12/18 1100) BP: (  70-136)/(58-78) 77/64 (12/18 1100) SpO2:  [96 %-100 %] 100 % (12/18 1100) FiO2 (%):  [50 %-100 %] 100 % (12/18 1000) Weight:  [253 lb 12 oz (115.1 kg)] 253 lb 12 oz (115.1 kg) (12/18 0436)  Physical Exam: General: In restraints, sedated  HEENT: anicteric sclera, EOMI CVS tachycardic rate, S2 wound is clean Chest: Fairly clear to auscultation anteriorly Abdomen: soft tended, normal bowel sounds, liquid dark stool in rectal bed Extremities: cyanotic fingers and toes   Neuro: nonfocal  CBC: CBC Latest Ref Rng & Units 11/09/2016 11/08/2016 11/07/2016  WBC 4.0 - 10.5 K/uL 8.8 10.6(H) 7.9  Hemoglobin 13.0 - 17.0 g/dL 8.9(L) 10.0(L) 8.9(L)  Hematocrit 39.0 - 52.0 % 31.3(L) 35.3(L) 30.7(L)  Platelets 150 - 400 K/uL 272 302 301     BMET  Recent Labs  11/08/16 0332 11/09/16 0432  NA 150* 150*  K 4.5 4.1  CL 113* 114*  CO2 30 31  GLUCOSE 139* 94  BUN 43* 44*  CREATININE 1.08 0.96  CALCIUM 7.9* 7.6*     Liver Panel   Recent Labs  11/07/16 0440 11/09/16 0432  PROT 4.8* 4.9*  ALBUMIN 1.6* 1.6*  AST 37 22  ALT 39 27  ALKPHOS 87 73  BILITOT 1.2 0.9       Sedimentation Rate No results for input(s): ESRSEDRATE in the last 72 hours. C-Reactive Protein No results for input(s): CRP in the last 72 hours.  Micro Results: Recent Results (from the past 720 hour(s))  Surgical pcr screen     Status: None   Collection Time: 10/21/16  4:18 AM  Result Value Ref Range Status   MRSA, PCR NEGATIVE NEGATIVE Final   Staphylococcus aureus NEGATIVE NEGATIVE Final    Comment:        The Xpert SA Assay (FDA approved for NASAL specimens in patients over 75 years of age), is one component of a comprehensive surveillance program.  Test performance has been validated by [facility] for patients greater than or equal to 42 year old. It is not intended to diagnose infection nor to guide or monitor  treatment.   Culture, blood (Routine X 2) w Reflex to ID Panel     Status: None   Collection Time: 10/23/16  3:18 PM  Result Value Ref Range Status   Specimen Description BLOOD RIGHT HAND  Final   Special Requests IN PEDIATRIC BOTTLE 1CC  Final   Culture NO GROWTH 6 DAYS  Final   Report Status 10/29/2016 FINAL  Final  Culture, blood (Routine X 2) w Reflex to ID Panel     Status: None   Collection Time: 10/23/16  3:23 PM  Result Value Ref Range Status   Specimen Description BLOOD RIGHT ANTECUBITAL  Final   Special Requests IN PEDIATRIC BOTTLE 3CC  Final   Culture NO GROWTH 6 DAYS  Final   Report Status 10/29/2016 FINAL  Final  Culture, blood (routine x 2)     Status: None   Collection Time: 10/26/16 12:45 PM  Result Value Ref Range Status   Specimen Description BLOOD RIGHT ANTECUBITAL  Final   Special Requests IN PEDIATRIC BOTTLE 1.5CC  Final   Culture NO GROWTH 5 DAYS  Final   Report Status 10/31/2016 FINAL  Final  Culture, blood (routine x 2)     Status: None   Collection Time: 10/26/16 12:50 PM  Result Value Ref Range Status   Specimen Description BLOOD RIGHT HAND  Final   Special Requests IN PEDIATRIC BOTTLE 3CC  Final   Culture NO GROWTH 5 DAYS  Final   Report Status 10/31/2016 FINAL  Final  Culture, blood (Routine X 2) w Reflex to ID Panel     Status: None   Collection Time: 10/29/16  2:47 AM  Result Value Ref Range Status   Specimen Description BLOOD RIGHT HAND  Final   Special Requests IN PEDIATRIC BOTTLE 1CC  Final   Culture NO GROWTH 5 DAYS  Final   Report Status 11/03/2016 FINAL  Final  Culture, blood (Routine X 2) w Reflex to ID Panel     Status: None   Collection Time: 10/29/16  5:51 AM  Result Value Ref Range Status   Specimen Description BLOOD RIGHT ARM  Final   Special Requests BOTTLES DRAWN AEROBIC AND ANAEROBIC 5CC   Final   Culture NO GROWTH 5 DAYS  Final   Report Status 11/03/2016 FINAL  Final  Culture, respiratory (NON-Expectorated)     Status: None    Collection Time: 10/29/16 10:21 AM  Result Value Ref Range Status   Specimen Description TRACHEAL ASPIRATE  Final   Special Requests NONE  Final   Gram Stain   Final    FEW WBC PRESENT, PREDOMINANTLY PMN MODERATE GRAM NEGATIVE RODS RARE GRAM POSITIVE COCCI    Culture Consistent with normal respiratory flora.  Final   Report Status 10/31/2016 FINAL  Final  Culture, respiratory (NON-Expectorated)     Status: None   Collection Time: 10/31/16 12:37 PM  Result Value Ref Range Status   Specimen Description TRACHEAL ASPIRATE  Final   Special Requests Normal  Final   Gram Stain   Final    RARE WBC PRESENT, PREDOMINANTLY MONONUCLEAR RARE GRAM NEGATIVE COCCOBACILLI    Culture RARE CANDIDA ALBICANS  Final   Report Status 11/03/2016 FINAL  Final  Culture, blood (single) w Reflex to ID Panel     Status: None   Collection Time: 10/31/16  2:08 PM  Result Value Ref Range Status   Specimen Description BLOOD RIGHT ANTECUBITAL  Final   Special Requests IN PEDIATRIC BOTTLE 3CC  Final   Culture NO GROWTH 5 DAYS  Final   Report Status 11/05/2016 FINAL  Final  Aerobic Culture (superficial specimen)     Status: None   Collection Time: 11/03/16  8:46 AM  Result Value Ref Range Status   Specimen Description WOUND RIGHT THIGH  Final   Special Requests NONE  Final   Gram Stain   Final    RARE WBC PRESENT, PREDOMINANTLY PMN NO ORGANISMS SEEN    Culture NO GROWTH 2 DAYS  Final   Report Status 11/06/2016 FINAL  Final  Culture, blood (routine x 2)     Status: None   Collection Time: 11/03/16  5:50 PM  Result Value Ref Range Status   Specimen Description BLOOD RIGHT ANTECUBITAL  Final   Special Requests BOTTLES DRAWN AEROBIC AND ANAEROBIC 10ML EACH  Final   Culture NO GROWTH 5 DAYS  Final   Report Status 11/08/2016 FINAL  Final  Culture, blood (routine x 2)     Status: None   Collection Time: 11/03/16  5:58 PM  Result Value Ref Range Status   Specimen Description BLOOD RIGHT ANTECUBITAL  Final    Special Requests BOTTLES DRAWN AEROBIC AND ANAEROBIC 10CC EACH  Final   Culture NO GROWTH 5 DAYS  Final   Report Status 11/08/2016 FINAL  Final  C difficile quick scan w PCR reflex     Status: Abnormal   Collection  Time: 11/03/16  6:21 PM  Result Value Ref Range Status   C Diff antigen POSITIVE (A) NEGATIVE Final   C Diff toxin POSITIVE (A) NEGATIVE Final   C Diff interpretation Toxin producing C. difficile detected.  Final    Comment: CRITICAL RESULT CALLED TO, READ BACK BY AND VERIFIED WITH: A.HARGETT,RN AT 2322 BY L.PITT 11/03/16   Culture, blood (routine x 2)     Status: None (Preliminary result)   Collection Time: 11/07/16 12:10 PM  Result Value Ref Range Status   Specimen Description BLOOD RIGHT ARM  Final   Special Requests   Final    BOTTLES DRAWN AEROBIC AND ANAEROBIC BLUE 5CC RED 4CC   Culture NO GROWTH 1 DAY  Final   Report Status PENDING  Incomplete  Culture, blood (routine x 2)     Status: None (Preliminary result)   Collection Time: 11/07/16 12:20 PM  Result Value Ref Range Status   Specimen Description BLOOD RIGHT HAND  Final   Special Requests BOTTLES DRAWN AEROBIC AND ANAEROBIC  5CC EA  Final   Culture NO GROWTH 1 DAY  Final   Report Status PENDING  Incomplete    Studies/Results: Dg Chest Port 1 View  Result Date: 11/09/2016 CLINICAL DATA:  Atelectasis.  Intubated patient. EXAM: PORTABLE CHEST 1 VIEW COMPARISON:  11/08/2016 FINDINGS: Endotracheal tube is 4.8 cm above the carina. Left arm PICC line tip in the upper SVC region. Nasogastric tube extends into the abdomen. Decreased lung volumes compared to the prior examination. Increased parenchymal densities at left lung base. Increased densities at the right lung base most likely associated with volume loss and atelectasis. Cardiac silhouette remains mildly enlarged but stable. Again noted are median sternotomy wires. Bilateral chest drains appear stable. Negative for a pneumothorax. IMPRESSION: Decreased lung volumes  with bibasilar atelectasis. Again noted are parenchymal densities in the left lower lung which could also represent airspace disease. Support apparatuses as described.  Negative for pneumothorax. Electronically Signed   By: Markus Daft M.D.   On: 11/09/2016 07:47   Dg Chest Port 1 View  Result Date: 11/08/2016 CLINICAL DATA:  Low O2 sats EXAM: PORTABLE CHEST 1 VIEW COMPARISON:  11/07/2016 FINDINGS: Bilateral chest tubes remain in place, unchanged. Endotracheal tube and NG tube are also unchanged as well as left PICC line. No pneumothorax. Bibasilar opacities, left greater than right, likely atelectasis. Small left effusion appear IMPRESSION: No pneumothorax. Bibasilar atelectasis with small left effusion. No real change. Electronically Signed   By: Rolm Baptise M.D.   On: 11/08/2016 10:04      Assessment/Plan:  INTERVAL HISTORY: She is status post bronchoscopy and tracheostomy placement   Principal Problem:   S/P AVR Active Problems:   Severe aortic stenosis by prior echocardiography   Unstable angina (HCC)   Severe aortic stenosis   Coronary artery disease involving native coronary artery of native heart with unstable angina pectoris (HCC)   Bicuspid aortic valve   Thoracic ascending aortic aneurysm (HCC)   Mitral regurgitation due to cusp prolapse   S/P ascending aortic aneurysm repair   S/P mitral valve repair   Hx of CABG   Cardiogenic shock (HCC)   Postoperative atrial fibrillation (HCC)   Thrombocytopenia (HCC)   Acute combined systolic and diastolic congestive heart failure (HCC)   Acute respiratory failure ([place])   DIC (disseminated intravascular coagulation) (HCC)   Elevated LFTs   Acute encephalopathy   Fever   Enteritis due to Clostridium difficile   Anasarca   Atherosclerosis  of native arteries of extremities with gangrene, left leg (HCC)   Atherosclerosis of native arteries of extremities with gangrene, right leg (HCC)   Acute on chronic diastolic CHF (congestive  heart failure) (HCC)    ZAILYN ROWSER is a 70 y.o. male with  history of aortic valve and mitral valve repair aneurysm repair who became hypercoagulable postoperatively found to have clots on his mitral valve now has been hypotensive with fevers found to have C. difficile colitis.  #1 Clostridium difficile: Continue vancomycin and tear clear along with IV Flagyl  #2 FUO: Not clear that he has an infectious endocarditis blood cultures have been sterile and we have been holding off on other antibiotics due to concern that we do not have a clear-cut target and that we might worsen his C. Difficile  Follow cultures and clinical status  #3 ischemic digits from pressors: Will likely need amputation eventually   LOS: 20 days   Alcide Evener 11/09/2016, 11:25 AM

## 2016-11-10 ENCOUNTER — Inpatient Hospital Stay (HOSPITAL_COMMUNITY): Payer: Medicare Other

## 2016-11-10 ENCOUNTER — Encounter (HOSPITAL_COMMUNITY): Payer: Self-pay | Admitting: Thoracic Surgery (Cardiothoracic Vascular Surgery)

## 2016-11-10 LAB — BLOOD GAS, ARTERIAL
ACID-BASE EXCESS: 5.9 mmol/L — AB (ref 0.0–2.0)
BICARBONATE: 29.9 mmol/L — AB (ref 20.0–28.0)
DRAWN BY: 44166
FIO2: 40
LHR: 12 {breaths}/min
MECHVT: 570 mL
O2 Saturation: 97.3 %
PEEP/CPAP: 5 cmH2O
PO2 ART: 100 mmHg (ref 83.0–108.0)
Patient temperature: 100.4
pCO2 arterial: 45.6 mmHg (ref 32.0–48.0)
pH, Arterial: 7.437 (ref 7.350–7.450)

## 2016-11-10 LAB — CBC
HCT: 36.5 % — ABNORMAL LOW (ref 39.0–52.0)
HEMOGLOBIN: 10.4 g/dL — AB (ref 13.0–17.0)
MCH: 28.2 pg (ref 26.0–34.0)
MCHC: 28.5 g/dL — ABNORMAL LOW (ref 30.0–36.0)
MCV: 98.9 fL (ref 78.0–100.0)
PLATELETS: 278 10*3/uL (ref 150–400)
RBC: 3.69 MIL/uL — AB (ref 4.22–5.81)
RDW: 18.3 % — ABNORMAL HIGH (ref 11.5–15.5)
WBC: 9.1 10*3/uL (ref 4.0–10.5)

## 2016-11-10 LAB — BASIC METABOLIC PANEL
Anion gap: 5 (ref 5–15)
BUN: 44 mg/dL — ABNORMAL HIGH (ref 6–20)
CHLORIDE: 112 mmol/L — AB (ref 101–111)
CO2: 34 mmol/L — ABNORMAL HIGH (ref 22–32)
Calcium: 8 mg/dL — ABNORMAL LOW (ref 8.9–10.3)
Creatinine, Ser: 1.01 mg/dL (ref 0.61–1.24)
Glucose, Bld: 127 mg/dL — ABNORMAL HIGH (ref 65–99)
POTASSIUM: 4.5 mmol/L (ref 3.5–5.1)
Sodium: 151 mmol/L — ABNORMAL HIGH (ref 135–145)

## 2016-11-10 LAB — MAGNESIUM: MAGNESIUM: 2.2 mg/dL (ref 1.7–2.4)

## 2016-11-10 LAB — HEPARIN LEVEL (UNFRACTIONATED)
HEPARIN UNFRACTIONATED: 0.23 [IU]/mL — AB (ref 0.30–0.70)
Heparin Unfractionated: 0.54 IU/mL (ref 0.30–0.70)

## 2016-11-10 LAB — PHOSPHORUS: PHOSPHORUS: 3.5 mg/dL (ref 2.5–4.6)

## 2016-11-10 MED ORDER — ATORVASTATIN CALCIUM 40 MG PO TABS
40.0000 mg | ORAL_TABLET | Freq: Every day | ORAL | Status: DC
  Administered 2016-11-10 – 2016-11-17 (×8): 40 mg
  Filled 2016-11-10 (×8): qty 1

## 2016-11-10 MED ORDER — WARFARIN - PHYSICIAN DOSING INPATIENT
Freq: Every day | Status: DC
Start: 2016-11-10 — End: 2016-11-11

## 2016-11-10 MED ORDER — METOLAZONE 5 MG PO TABS
5.0000 mg | ORAL_TABLET | Freq: Every day | ORAL | Status: AC
  Administered 2016-11-10: 5 mg via ORAL
  Filled 2016-11-10: qty 1

## 2016-11-10 MED ORDER — ACETAMINOPHEN 160 MG/5ML PO SOLN
650.0000 mg | Freq: Four times a day (QID) | ORAL | Status: DC | PRN
  Administered 2016-11-10 – 2016-11-16 (×3): 650 mg via ORAL
  Filled 2016-11-10 (×3): qty 20.3

## 2016-11-10 MED ORDER — WARFARIN SODIUM 2 MG PO TABS
2.0000 mg | ORAL_TABLET | Freq: Every day | ORAL | Status: DC
  Administered 2016-11-10: 2 mg
  Filled 2016-11-10: qty 1

## 2016-11-10 MED ORDER — FREE WATER
400.0000 mL | Freq: Four times a day (QID) | Status: DC
  Administered 2016-11-10 – 2016-11-17 (×27): 400 mL

## 2016-11-10 MED ORDER — VITAL HIGH PROTEIN PO LIQD
1000.0000 mL | ORAL | Status: DC
  Administered 2016-11-10 – 2016-11-18 (×7): 1000 mL

## 2016-11-10 NOTE — Progress Notes (Signed)
RT called at this time; pt weaning on vent; pt very agitated at this time; HR up to 120's; BP elevated from previous; pt placed back on full vent support; will attempt to wean at a later time; precedex restarted at this time at low dose; will cont. To monitor.  Ruben Reason

## 2016-11-10 NOTE — Progress Notes (Signed)
      [address]       [place] [address]             [phone]        CARDIOTHORACIC SURGERY PROGRESS NOTE   R1 Day Post-Op Procedure(s) (LRB): TRACHEOSTOMY (N/A) VIDEO BRONCHOSCOPY (N/A)  Subjective: Sedated but opens eyes on command.  Looks comfortable  Objective: Vital signs: BP Readings from Last 1 Encounters:  11/10/16 (!) 86/63   Pulse Readings from Last 1 Encounters:  11/10/16 (!) 102   Resp Readings from Last 1 Encounters:  11/10/16 16   Temp Readings from Last 1 Encounters:  11/10/16 99.9 F (37.7 C) (Core (Comment))    Hemodynamics: CVP:  [6 mmHg-17 mmHg] 12 mmHg  Physical Exam:  Rhythm:   sinus  Breath sounds: clear  Heart sounds:  RRR  Incisions:  Clean and dry  Abdomen:  Soft, non-distended, G-tube residuals low  Extremities:  Warm, adequately perfused, feet look better  Chest tubes:  decreasing volume thin serosanguinous output, no air leak     Intake/Output from previous day: 12/18 0701 - 12/19 0700 In: 2995.2 [I.V.:2190.2; NG/GT:805] Out: 6585 [Urine:5775; Stool:400; Chest Tube:410] Intake/Output this shift: Total I/O In: 457.7 [I.V.:102.7; NG/GT:355] Out: 250 [Urine:250]  Lab Results:  CBC: Recent Labs  11/09/16 0432 11/10/16 0212  WBC 8.8 9.1  HGB 8.9* 10.4*  HCT 31.3* 36.5*  PLT 272 278    BMET:  Recent Labs  11/09/16 0432 11/10/16 0212  NA 150* 151*  K 4.1 4.5  CL 114* 112*  CO2 31 34*  GLUCOSE 94 127*  BUN 44* 44*  CREATININE 0.96 1.01  CALCIUM 7.6* 8.0*     PT/INR:  No results for input(s): LABPROT, INR in the last 72 hours.  CBG (last 3)   Recent Labs  11/09/16 1622 11/09/16 2040 11/09/16 2317  GLUCAP 77 127* 193*    ABG    Component Value Date/Time   PHART 7.437 11/10/2016 0340   PCO2ART 45.6 11/10/2016 0340   PO2ART 100 11/10/2016 0340   HCO3 29.9 (H) 11/10/2016 0340   TCO2 36 11/04/2016 1950   ACIDBASEDEF 1.0 10/27/2016 1125   O2SAT 97.3 11/10/2016 0340     CXR: PORTABLE CHEST 1 VIEW  COMPARISON:  11/09/2016  FINDINGS: Tracheostomy tube in place. Status post median sternotomy. No pulmonary edema. Improvement in aeration from prior exam. Streaky mild left basilar atelectasis or infiltrate. NG tube in place. Bilateral chest tube unchanged in position.  IMPRESSION: Improvement in aeration. Streaky mild left basilar atelectasis or infiltrate. Tracheostomy tube in place. No pulmonary edema.   Electronically Signed   By: Lahoma Crocker M.D.   On: 11/10/2016 08:20  Assessment/Plan: S/P Procedure(s) (LRB): TRACHEOSTOMY (N/A) VIDEO BRONCHOSCOPY (N/A)  Clinically stable, slowly improving Will try to wean sedation Continue vent weaning per Pulm/CCM team Increase free H2O replacement Start Coumadin  Scott Alberts, MD 11/10/2016 8:07 AM

## 2016-11-10 NOTE — Progress Notes (Signed)
PCCM Progress Note  ADMISSION DATE:  10/20/2016 CONSULTATION DATE: 10/23/2016 REFERRING MD: Ricard Dillon  CHIEF COMPLAINT:  Chest pain  HISTORY OF PRESENT ILLNESS:   70 yo former smoker with known CAD and ascending aorta aneurysm, who was taken to OR 11/30 for cabg x 3, aortic root replacement, MVR replacement, thoracic cavity left open and had extended pump time in OR.  SUBJECTIVE:  Trach done on 12/18, no events overnight.  VITAL SIGNS: BP 113/80   Pulse (!) 104   Temp 99.9 F (37.7 C)   Resp (!) 23   Ht 5\' 9"  (1.753 m)   Wt 111.8 kg (246 lb 7.6 oz)   SpO2 100%   BMI 36.40 kg/m   HEMODYNAMICS: CVP:  [6 mmHg-17 mmHg] 12 mmHg  INTAKE / OUTPUT: I/O last 3 completed shifts: In: 4809.7 [I.V.:3099.7; NG/GT:1710] Out: 8120 [Urine:6900; Stool:600; Chest Tube:620]  PHYSICAL EXAMINATION: General: ill appearing male, NAD  Neuro: RASS -1 not follow commands , sedated  HEENT:  ETT in place Cardiovascular: irregular, chest closed Lungs:  No wheeze Abdomen:  Soft, non tender Ext: 1+ edema, extremities cool Skin: Ischemic changes in fingers/toes  b/l  CMP Latest Ref Rng & Units 11/10/2016 11/09/2016 11/08/2016  Glucose 65 - 99 mg/dL 127(H) 94 139(H)  BUN 6 - 20 mg/dL 44(H) 44(H) 43(H)  Creatinine 0.61 - 1.24 mg/dL 1.01 0.96 1.08  Sodium 135 - 145 mmol/L 151(H) 150(H) 150(H)  Potassium 3.5 - 5.1 mmol/L 4.5 4.1 4.5  Chloride 101 - 111 mmol/L 112(H) 114(H) 113(H)  CO2 22 - 32 mmol/L 34(H) 31 30  Calcium 8.9 - 10.3 mg/dL 8.0(L) 7.6(L) 7.9(L)  Total Protein 6.5 - 8.1 g/dL - 4.9(L) -  Total Bilirubin 0.3 - 1.2 mg/dL - 0.9 -  Alkaline Phos 38 - 126 U/L - 73 -  AST 15 - 41 U/L - 22 -  ALT 17 - 63 U/L - 27 -    CBC Latest Ref Rng & Units 11/10/2016 11/09/2016 11/08/2016  WBC 4.0 - 10.5 K/uL 9.1 8.8 10.6(H)  Hemoglobin 13.0 - 17.0 g/dL 10.4(L) 8.9(L) 10.0(L)  Hematocrit 39.0 - 52.0 % 36.5(L) 31.3(L) 35.3(L)  Platelets 150 - 400 K/uL 278 272 302    ABG    Component Value  Date/Time   PHART 7.437 11/10/2016 0340   PCO2ART 45.6 11/10/2016 0340   PO2ART 100 11/10/2016 0340   HCO3 29.9 (H) 11/10/2016 0340   TCO2 36 11/04/2016 1950   ACIDBASEDEF 1.0 10/27/2016 1125   O2SAT 97.3 11/10/2016 0340    CBG (last 3)   Recent Labs  11/09/16 1622 11/09/16 2040 11/09/16 2317  GLUCAP 77 127* 193*    Imaging: Dg Chest Port 1 View  Result Date: 11/10/2016 CLINICAL DATA:  Tracheostomy tube, status post CABG EXAM: PORTABLE CHEST 1 VIEW COMPARISON:  11/09/2016 FINDINGS: Tracheostomy tube in place. Status post median sternotomy. No pulmonary edema. Improvement in aeration from prior exam. Streaky mild left basilar atelectasis or infiltrate. NG tube in place. Bilateral chest tube unchanged in position. IMPRESSION: Improvement in aeration. Streaky mild left basilar atelectasis or infiltrate. Tracheostomy tube in place. No pulmonary edema. Electronically Signed   By: Lahoma Crocker M.D.   On: 11/10/2016 08:20   Dg Chest Port 1 View  Result Date: 11/09/2016 CLINICAL DATA:  Atelectasis.  Intubated patient. EXAM: PORTABLE CHEST 1 VIEW COMPARISON:  11/08/2016 FINDINGS: Endotracheal tube is 4.8 cm above the carina. Left arm PICC line tip in the upper SVC region. Nasogastric tube extends into the abdomen. Decreased  lung volumes compared to the prior examination. Increased parenchymal densities at left lung base. Increased densities at the right lung base most likely associated with volume loss and atelectasis. Cardiac silhouette remains mildly enlarged but stable. Again noted are median sternotomy wires. Bilateral chest drains appear stable. Negative for a pneumothorax. IMPRESSION: Decreased lung volumes with bibasilar atelectasis. Again noted are parenchymal densities in the left lower lung which could also represent airspace disease. Support apparatuses as described.  Negative for pneumothorax. Electronically Signed   By: Markus Daft M.D.   On: 11/09/2016 07:47   Dg Chest Port 1  View  Result Date: 11/08/2016 CLINICAL DATA:  Low O2 sats EXAM: PORTABLE CHEST 1 VIEW COMPARISON:  11/07/2016 FINDINGS: Bilateral chest tubes remain in place, unchanged. Endotracheal tube and NG tube are also unchanged as well as left PICC line. No pneumothorax. Bibasilar opacities, left greater than right, likely atelectasis. Small left effusion appear IMPRESSION: No pneumothorax. Bibasilar atelectasis with small left effusion. No real change. Electronically Signed   By: Rolm Baptise M.D.   On: 11/08/2016 10:04   Dg Abd Portable 1v  Result Date: 11/09/2016 CLINICAL DATA:  NG tube placement. EXAM: PORTABLE ABDOMEN - 1 VIEW COMPARISON:  10/29/2016 . FINDINGS: Feeding tube noted with tip over the stomach. No bowel distention. Degenerative changes lumbar spine. IMPRESSION: Feeding tube noted with tip over the stomach.  No bowel distention. Electronically Signed   By: Marcello Moores  Register   On: 11/09/2016 17:17    STUDIES:  12.5 dopplers lowers>>>neg 12/6 dopplers arms >>>neg 12/7 abdo us>>>Cholelithiasis without sonographic evidence for acute cholecystitis. No biliary dilatation.Increased hepatic echogenicity, suggestive of steatosis. Ascites. 12/7 echo>>>60-65%, rv poor images but appeared wnl  CULTURES: C diff 12/12 >> positive Blood 12/12 >> Rt thigh wound 12/12 >> neg   ANTIBIOTICS:  Acyclovir 12/10 >> PO vancomycin 12/12 >> Flagyl 12/13 >>  SIGNIFICANT EVENTS: 11/30 surgery, hypoxia 12/4 closed chest 12/7 high fevers, LFt up  LINES/TUBES: 11/30 ETT>>12/18 Trach (CVTS) 12/18>>> 12/04 picc left >>> 11/30 CT x 4>> R CT x 2 12/16>>>  ASSESSMENT / PLAN:  Acute hypoxic respiratory failure after CABG with MVR. - Begin PS trials today, anticipate will be able to progress quickly to [facility] but with full vent support at night. - Titrate O2 for sat of 88-92% - F/u CXR and ABG as needed at this point.  CAD s/p CABG, MVR, aortic root replacement. A fib. Rt atrial clot. - PO amiodarone,  ASA - Lasix gtt at 10 mg/hr. - Zaroxolyn x1 dose. - Heparin gtt per pharmacy.  B/l hand/feet ischemia. - Will need further therapy when more stable  C diff colitis. - Flagyl, enteral vancomycin per ID  Anemia of critical illness. - F/u CBC  DM. - SSI with lantus  Acute metabolic encephalopathy. - RASS goal 0 to -1 - Continue precedex - Fentanyl drip for pain control  DVT prophylaxis - heparin gtt SUP - Pepcid Nutrition - tubes feeds Goals of care - Full code  Resolved problems >> DIC  Daughter updated at length 12/19.  The patient is critically ill with multiple organ systems failure and requires high complexity decision making for assessment and support, frequent evaluation and titration of therapies, application of advanced monitoring technologies and extensive interpretation of multiple databases.   Critical Care Time devoted to patient care services described in this note is  35  Minutes. This time reflects time of care of this signee Dr Jennet Maduro. This critical care time does not reflect procedure  time, or teaching time or supervisory time of PA/NP/Med student/Med Resident etc but could involve care discussion time.  Rush Farmer, M.D. [facility] Pulmonary/Critical Care Medicine. Pager: [phone]. After hours pager: [phone].

## 2016-11-10 NOTE — Progress Notes (Signed)
ANTICOAGULATION CONSULT NOTE - Follow Up Consult  Pharmacy Consult for Heparin Indication: possible clot in RA and afib   Allergies  Allergen Reactions  . Doxycycline Hives  . Penicillins Hives     Has patient had a PCN reaction causing immediate rash, facial/tongue/throat swelling, SOB or lightheadedness with hypotension: No Has patient had a PCN reaction causing severe rash involving mucus membranes or skin necrosis: No Has patient had a PCN reaction that required hospitalization: No Has patient had a PCN reaction occurring within the last 10 years:# # # YES # # #  If all of the above answers are "NO", then may proceed with Cephalosporin use.   . Sulfa Antibiotics Hives    Patient Measurements: Height: 5\' 9"  (175.3 cm) Weight: 253 lb 12 oz (115.1 kg) IBW/kg (Calculated) : 70.7  Heparin dosing wt: 97 kg  Vital Signs: Temp: 100 F (37.8 C) (12/19 0200) Temp Source: Core (Comment) (12/19 0000) BP: 103/76 (12/19 0200) Pulse Rate: 103 (12/19 0200)  Labs:  Recent Labs  11/07/16 0440 11/07/16 0520 11/08/16 0332 11/09/16 0432 11/10/16 0212  HGB 8.9*  --  10.0* 8.9* 10.4*  HCT 30.7*  --  35.3* 31.3* 36.5*  PLT 301  --  302 272 278  HEPARINUNFRC  --  0.46 0.53  --  0.23*  CREATININE 1.02  --  1.08 0.96  --     Estimated Creatinine Clearance: 89.6 mL/min (by C-G formula based on SCr of 0.96 mg/dL).  Assessment: 70yom on heparin for possible right atrial clot and afib. HIT ruled out (low heparin antibody and SRA negative). Heparin off 12/18 0000  for trach - restarted ~1900.  Heparin level therapeutic down to subtherapeutic (0.23) on gtt at 1950 units/hr. Noted pt previously therapeutic on this rate. No issues with line or bleeding reported per RN.   Goal of Therapy:  Heparin level goal 0.3-0.7 units/ml Monitor platelets by anticoagulation protocol: Yes   Plan:  Increase heparin drip slightly  to 2100 units / hr F/u 8hr heparin level  Sherlon Handing, PharmD,  BCPS Clinical pharmacist, pager [phone] 11/10/2016 2:50 AM

## 2016-11-10 NOTE — Progress Notes (Signed)
Trach care done per RRT. Site was bloody. Site was cleansed with NS/Sterile water, dried, and new clean dressing applied. New IC inserted and locked back into place no distress or complications noted. Pt is stable at this time resting well. SATs 100%   Clavin Ruhlman L. Tamala Julian, BS, RRT, RCP

## 2016-11-10 NOTE — Progress Notes (Signed)
ANTICOAGULATION CONSULT NOTE - Follow Up Consult  Pharmacy Consult for Heparin Indication: possible clot in RA and afib   Allergies  Allergen Reactions  . Doxycycline Hives  . Penicillins Hives     Has patient had a PCN reaction causing immediate rash, facial/tongue/throat swelling, SOB or lightheadedness with hypotension: No Has patient had a PCN reaction causing severe rash involving mucus membranes or skin necrosis: No Has patient had a PCN reaction that required hospitalization: No Has patient had a PCN reaction occurring within the last 10 years:# # # YES # # #  If all of the above answers are "NO", then may proceed with Cephalosporin use.   . Sulfa Antibiotics Hives    Patient Measurements: Height: 5\' 9"  (175.3 cm) Weight: 246 lb 7.6 oz (111.8 kg) IBW/kg (Calculated) : 70.7  Heparin dosing wt: 97 kg  Vital Signs: Temp: 100.9 F (38.3 C) (12/19 1300) Temp Source: Core (Comment) (12/19 1300) BP: 139/92 (12/19 1300) Pulse Rate: 108 (12/19 1300)  Labs:  Recent Labs  11/08/16 0332 11/09/16 0432 11/10/16 0212 11/10/16 1212  HGB 10.0* 8.9* 10.4*  --   HCT 35.3* 31.3* 36.5*  --   PLT 302 272 278  --   HEPARINUNFRC 0.53  --  0.23* 0.54  CREATININE 1.08 0.96 1.01  --     Estimated Creatinine Clearance: 83.8 mL/min (by C-G formula based on SCr of 1.01 mg/dL).  Assessment: 70yom on heparin for possible right atrial clot and afib. HIT ruled out (low heparin antibody and SRA negative).  Heparin level therapeutic (0.5) on gtt at 2100 units/hr. No issues with line or bleeding reported per RN.   Goal of Therapy:  Heparin level goal 0.3-0.7 units/ml Monitor platelets by anticoagulation protocol: Yes   Plan:  Continue heparin drip at 2100 units / hr Daily Heparin level/CBC  Erin Hearing PharmD., BCPS Clinical Pharmacist Pager [phone] 11/10/2016 1:32 PM

## 2016-11-10 NOTE — Progress Notes (Signed)
Patient ID: Scott [name], male   DOB: 06-30-1946, 70 y.o.   MRN: [id number]     Advanced Heart Failure Rounding Note  Referring Physician: Dr Roxy Manns Primary Physician: Dr Quintin Alto Primary Cardiologist:  Dr Percival Spanish   Reason for Consultation: Heart Failure   Subjective:    Admitted with chest pain. RHC//LHC with 3 vessel disease. Taken to the OR 11/30 for cabg x 3, repair of thoracic ascending aneurysm, AVR bioprosthetic,  MVR, and open chest with VAC placement.    Chest successfully closed 10/26/16.  Off milrinone and phenylephrine now.      S/p tracheostomy am of 11/09/16.   CVP 12 currently. Remains on lasix 10 mg/hr. I/Os negative, weight coming down.       HR 100s, suspect atrial flutter.    Suspect DIC and not HIT.  Now back on heparin gtt with RA clot and atrial fibrillation. HIT Ab and SRA negative. Warfarin has been started.   ID following. Remains on oral vanc and IV flagyl currently. TMax 100.  TEE 10/26/16 EF 40-45%, small cavity size, flattened/hypokinetic septum, s/p MV repair with mild MR, mild to moderately decreased RV systolic function. + RA thrombus.   Limited echo 10/29/16 LVEF 60-65%, unable to comment on AVR structure or function with poor windows.   Upper and lower extremity doppler studies: No DVTs  ABIs (12/8): Unable to doppler PTs, or get TBIs.   Objective:   Weight Range: 246 lb 7.6 oz (111.8 kg) Body mass index is 36.4 kg/m.   Vital Signs:   Temp:  [99.3 F (37.4 C)-100 F (37.8 C)] 99.9 F (37.7 C) (12/19 0830) Pulse Rate:  [35-106] 102 (12/19 0830) Resp:  [10-23] 16 (12/19 0830) BP: (71-105)/(54-78) 88/67 (12/19 0830) SpO2:  [96 %-100 %] 100 % (12/19 0830) FiO2 (%):  [40 %-100 %] 40 % (12/19 0800) Weight:  [246 lb 7.6 oz (111.8 kg)] 246 lb 7.6 oz (111.8 kg) (12/19 0335) Last BM Date: 11/10/16  Weight change: Filed Weights   11/08/16 0500 11/09/16 0436 11/10/16 0335  Weight: 249 lb 9 oz (113.2 kg) 253 lb 12 oz (115.1 kg) 246 lb 7.6 oz  (111.8 kg)    Intake/Output:   Intake/Output Summary (Last 24 hours) at 11/10/16 0903 Last data filed at 11/10/16 0800  Gross per 24 hour  Intake          3452.83 ml  Output             6835 ml  Net         -3382.17 ml     Physical Exam: CVP 12 General: Tracheostomy in place.  NAD.  HEENT: + Trach Neck: supple. JVP 11-12. Carotids 2+ bilat; no bruits. No thyromegaly or nodule noted.    Cor: PMI nondisplaced. Mildly tachy, regular.  No M/G/R noted. Chest tubes in place.  Lungs: Scattered rhonchi, Mechanical breathing sounds present.   Abdomen: soft, NT, ND, no HSM. No bruits or masses. +BS  Extremities: no clubbing, rash. 1-2+ edema 1/2 way to knees. Digital gangrene, fingers.  Toes mildly dusky. Unable to doppler pedal pulses.  Neuro: Trachestomy in place. Awake/alert.  GU: Foley in place  Telemetry: Reviewed, Currently suspect atrial flutter rate around 100.   Labs: CBC  Recent Labs  11/09/16 0432 11/10/16 0212  WBC 8.8 9.1  HGB 8.9* 10.4*  HCT 31.3* 36.5*  MCV 98.1 98.9  PLT 272 165   Basic Metabolic Panel  Recent Labs  11/09/16 0432 11/10/16 0212  NA 150*  151*  K 4.1 4.5  CL 114* 112*  CO2 31 34*  GLUCOSE 94 127*  BUN 44* 44*  CREATININE 0.96 1.01  CALCIUM 7.6* 8.0*  MG  --  2.2  PHOS  --  3.5   Liver Function Tests  Recent Labs  11/09/16 0432  AST 22  ALT 27  ALKPHOS 73  BILITOT 0.9  PROT 4.9*  ALBUMIN 1.6*   No results for input(s): LIPASE, AMYLASE in the last 72 hours. Cardiac Enzymes No results for input(s): CKTOTAL, CKMB, CKMBINDEX, TROPONINI in the last 72 hours.  BNP: BNP (last 3 results) No results for input(s): BNP in the last 8760 hours.  ProBNP (last 3 results) No results for input(s): PROBNP in the last 8760 hours.   D-Dimer No results for input(s): DDIMER in the last 72 hours. Hemoglobin A1C No results for input(s): HGBA1C in the last 72 hours. Fasting Lipid Panel No results for input(s): CHOL, HDL, LDLCALC, TRIG,  CHOLHDL, LDLDIRECT in the last 72 hours. Thyroid Function Tests No results for input(s): TSH, T4TOTAL, T3FREE, THYROIDAB in the last 72 hours.  Invalid input(s): FREET3  Other results:  Imaging/Studies:  Dg Chest Port 1 View  Result Date: 11/10/2016 CLINICAL DATA:  Tracheostomy tube, status post CABG EXAM: PORTABLE CHEST 1 VIEW COMPARISON:  11/09/2016 FINDINGS: Tracheostomy tube in place. Status post median sternotomy. No pulmonary edema. Improvement in aeration from prior exam. Streaky mild left basilar atelectasis or infiltrate. NG tube in place. Bilateral chest tube unchanged in position. IMPRESSION: Improvement in aeration. Streaky mild left basilar atelectasis or infiltrate. Tracheostomy tube in place. No pulmonary edema. Electronically Signed   By: Lahoma Crocker M.D.   On: 11/10/2016 08:20   Dg Chest Port 1 View  Result Date: 11/09/2016 CLINICAL DATA:  Atelectasis.  Intubated patient. EXAM: PORTABLE CHEST 1 VIEW COMPARISON:  11/08/2016 FINDINGS: Endotracheal tube is 4.8 cm above the carina. Left arm PICC line tip in the upper SVC region. Nasogastric tube extends into the abdomen. Decreased lung volumes compared to the prior examination. Increased parenchymal densities at left lung base. Increased densities at the right lung base most likely associated with volume loss and atelectasis. Cardiac silhouette remains mildly enlarged but stable. Again noted are median sternotomy wires. Bilateral chest drains appear stable. Negative for a pneumothorax. IMPRESSION: Decreased lung volumes with bibasilar atelectasis. Again noted are parenchymal densities in the left lower lung which could also represent airspace disease. Support apparatuses as described.  Negative for pneumothorax. Electronically Signed   By: Markus Daft M.D.   On: 11/09/2016 07:47   Dg Chest Port 1 View  Result Date: 11/08/2016 CLINICAL DATA:  Low O2 sats EXAM: PORTABLE CHEST 1 VIEW COMPARISON:  11/07/2016 FINDINGS: Bilateral chest  tubes remain in place, unchanged. Endotracheal tube and NG tube are also unchanged as well as left PICC line. No pneumothorax. Bibasilar opacities, left greater than right, likely atelectasis. Small left effusion appear IMPRESSION: No pneumothorax. Bibasilar atelectasis with small left effusion. No real change. Electronically Signed   By: Rolm Baptise M.D.   On: 11/08/2016 10:04   Dg Abd Portable 1v  Result Date: 11/09/2016 CLINICAL DATA:  NG tube placement. EXAM: PORTABLE ABDOMEN - 1 VIEW COMPARISON:  10/29/2016 . FINDINGS: Feeding tube noted with tip over the stomach. No bowel distention. Degenerative changes lumbar spine. IMPRESSION: Feeding tube noted with tip over the stomach.  No bowel distention. Electronically Signed   By: [place]   On: 11/09/2016 17:17    Medications:  Scheduled Medications: . acyclovir  200 mg Oral QID  . amiodarone  400 mg Per Tube BID  . aspirin EC  325 mg Oral Daily   Or  . aspirin  324 mg Per Tube Daily  . atorvastatin  40 mg Per Tube q1800  . chlorhexidine gluconate (MEDLINE KIT)  15 mL Mouth Rinse BID  . famotidine  20 mg Per Tube Q12H  . feeding supplement (PRO-STAT SUGAR FREE 64)  30 mL Per Tube BID  . free water  400 mL Per Tube Q6H  . insulin aspart  0-24 Units Subcutaneous Q4H  . insulin glargine  15 Units Subcutaneous BID  . mouth rinse  15 mL Mouth Rinse QID  . metroNIDAZOLE  500 mg Per Tube TID  . potassium chloride  40 mEq Per Tube Q6H  . sodium chloride flush  3 mL Intravenous Q12H  . vancomycin  500 mg Per Tube Q6H  . warfarin  2 mg Per Tube q1800  . Warfarin - Physician Dosing Inpatient   Does not apply q1800    Infusions: . sodium chloride 10 mL/hr at 11/10/16 0800  . dexmedetomidine 0.2 mcg/kg/hr (11/10/16 0845)  . feeding supplement (VITAL HIGH PROTEIN) 1,000 mL (11/10/16 0800)  . fentaNYL infusion INTRAVENOUS 275 mcg/hr (11/10/16 0827)  . furosemide (LASIX) infusion 10 mg/hr (11/10/16 0800)  . heparin 2,100 Units/hr  (11/10/16 0800)    PRN Medications: Gerhardt's butt cream, ipratropium-albuterol, metoprolol, midazolam, morphine injection, ondansetron (ZOFRAN) IV, sodium chloride flush   Assessment/Plan   Mr Grell is 70 year old admitted with CP. Complex operation this admission.  CABG x 3 + bioprosthetic AVR for severe bicuspid AS + ascending aorta replacement + MV repair for flail P2.  MV repair complicated by severe SAM, ended up having to remove posterior annuloplasty band.  Chest closed on 12/3.   1. CAD:  10/22/16 S/P CABG x3 with chest left open.  Chest successfully closed am of 10/26/16. - Continue ASA - Start atorvastatin 40 daily with LFTs back to normal range when last checked.  2. Severe aortic stenosis: 11/302017 S/P AVR bioprostheic  3. Mitral regurgitation: Partial flail leaflet with MV repair.  Developed SAM initially after repair and posterior annuloplasty ring had to be removed.  SAM resolved. 4. Acute on chronic systolic CHF:  TEE at time of chest closure showed EF 40-45%, small cavity size, flattened/hypokinetic septum, s/p MV repair with mild MR, mild to moderately decreased RV systolic function, thrombus in RA. Repeat echo 12/7 with EF 60-65%.  Weaned off off milrinone and phenylephrine.  CVP 12, weight remains much above baseline.  - Continue Lasix 10 mg/hr.  Good diuresis with stable renal function.  5. ID: ?Source of fever => ID following, do not think endocarditis.  - Concerned digital gangrene is a contributor.  - Currently on po vanc and IV flagyl for C difficile.   6. Elevated LFTs: Likely shock liver.  Now back down to WNL.  - May restart statin.    7. Heme: HIT negative, suspect DIC. Platelet count has recovered.  - Now back on heparin gtt for RA thrombus and atrial fibrillation.  - warfarin started.   8. VDRF: CCM following, suspect pulmonary edema playing a role. S/p tracheostomy am of 11/09/16. He is awake on vent, plan for trach collar trials.  9. Atrial  fibrillation/flutter: Suspect atrial flutter currently with HR around 100.   - ECG today.  - On heparin => warfarin.  - Continue po amiodarone, consider TEE-guided DCCV  prior to discharge.   10. PAD: Finger gangrene, mottled toes.  Unable to doppler PT pulses or get TBIs.  May be source of fever.  Seen by Dr Sharol Given, eventually will need amputation of most digits at PIP joint.   Loralie Champagne, MD 11/10/2016 9:03 AM

## 2016-11-10 NOTE — Progress Notes (Signed)
Patient ID: Scott [name], male   DOB: December 09, 1945, 70 y.o.   MRN: [id number] EVENING ROUNDS NOTE :     Scott [name].[address]       [place] [address]             [phone]                 1 Day Post-Op Procedure(s) (LRB): TRACHEOSTOMY (N/A) VIDEO BRONCHOSCOPY (N/A)  Total Length of Stay:  LOS: 21 days  BP 138/75   Pulse (!) 114   Temp (!) 102 F (38.9 C)   Resp 19   Ht 5\' 9"  (1.753 m)   Wt 246 lb 7.6 oz (111.8 kg)   SpO2 100%   BMI 36.40 kg/m   .Intake/Output      12/18 0701 - 12/19 0700 12/19 0701 - 12/20 0700   I.V. (mL/kg) 2190.2 (19.6) 855.8 (7.7)   NG/GT 805 1770   Total Intake(mL/kg) 2995.2 (26.8) 2625.8 (23.5)   Urine (mL/kg/hr) 5775 (2.2) 3470 (2.8)   Emesis/NG output  0 (0)   Stool 400 (0.1) 300 (0.2)   Chest Tube 410 (0.2) 100 (0.1)   Total Output 6585 3870   Net -3589.8 -1244.2          . sodium chloride 10 mL/hr at 11/10/16 1700  . dexmedetomidine 0.701 mcg/kg/hr (11/10/16 1700)  . feeding supplement (VITAL HIGH PROTEIN) 1,000 mL (11/10/16 1700)  . fentaNYL infusion INTRAVENOUS 300 mcg/hr (11/10/16 1700)  . furosemide (LASIX) infusion 10 mg/hr (11/10/16 1700)  . heparin 2,100 Units/hr (11/10/16 1700)     Lab Results  Component Value Date   WBC 9.1 11/10/2016   HGB 10.4 (L) 11/10/2016   HCT 36.5 (L) 11/10/2016   PLT 278 11/10/2016   GLUCOSE 127 (H) 11/10/2016   CHOL 134 10/21/2016   TRIG 203 (H) 10/21/2016   HDL 28 (L) 10/21/2016   LDLCALC 65 10/21/2016   ALT 27 11/09/2016   AST 22 11/09/2016   NA 151 (H) 11/10/2016   K 4.5 11/10/2016   CL 112 (H) 11/10/2016   CREATININE 1.01 11/10/2016   BUN 44 (H) 11/10/2016   CO2 34 (H) 11/10/2016   TSH 1.93 10/19/2016   INR 1.88 11/04/2016   HGBA1C 6.6 (H) 10/21/2016   Temp up again  Recent Results (from the past 240 hour(s))  Aerobic Culture (superficial specimen)     Status: None   Collection Time: 11/03/16  8:46 AM  Result Value Ref Range Status   Specimen Description WOUND  RIGHT THIGH  Final   Special Requests NONE  Final   Gram Stain   Final    RARE WBC PRESENT, PREDOMINANTLY PMN NO ORGANISMS SEEN    Culture NO GROWTH 2 DAYS  Final   Report Status 11/06/2016 FINAL  Final  Culture, blood (routine x 2)     Status: None   Collection Time: 11/03/16  5:50 PM  Result Value Ref Range Status   Specimen Description BLOOD RIGHT ANTECUBITAL  Final   Special Requests BOTTLES DRAWN AEROBIC AND ANAEROBIC 10ML EACH  Final   Culture NO GROWTH 5 DAYS  Final   Report Status 11/08/2016 FINAL  Final  Culture, blood (routine x 2)     Status: None   Collection Time: 11/03/16  5:58 PM  Result Value Ref Range Status   Specimen Description BLOOD RIGHT ANTECUBITAL  Final   Special Requests BOTTLES DRAWN AEROBIC AND ANAEROBIC 10CC EACH  Final   Culture NO GROWTH 5  DAYS  Final   Report Status 11/08/2016 FINAL  Final  C difficile quick scan w PCR reflex     Status: Abnormal   Collection Time: 11/03/16  6:21 PM  Result Value Ref Range Status   C Diff antigen POSITIVE (A) NEGATIVE Final   C Diff toxin POSITIVE (A) NEGATIVE Final   C Diff interpretation Toxin producing C. difficile detected.  Final    Comment: CRITICAL RESULT CALLED TO, READ BACK BY AND VERIFIED WITH: A.HARGETT,RN AT 2322 BY L.PITT 11/03/16   Culture, blood (routine x 2)     Status: None (Preliminary result)   Collection Time: 11/07/16 12:10 PM  Result Value Ref Range Status   Specimen Description BLOOD RIGHT ARM  Final   Special Requests   Final    BOTTLES DRAWN AEROBIC AND ANAEROBIC BLUE 5CC RED 4CC   Culture NO GROWTH 3 DAYS  Final   Report Status PENDING  Incomplete  Culture, blood (routine x 2)     Status: None (Preliminary result)   Collection Time: 11/07/16 12:20 PM  Result Value Ref Range Status   Specimen Description BLOOD RIGHT HAND  Final   Special Requests BOTTLES DRAWN AEROBIC AND ANAEROBIC  5CC EA  Final   Culture NO GROWTH 3 DAYS  Final   Report Status PENDING  Incomplete  Culture,  respiratory (NON-Expectorated)     Status: None (Preliminary result)   Collection Time: 11/09/16  8:23 AM  Result Value Ref Range Status   Specimen Description BRONCHIAL ALVEOLAR LAVAGE  Final   Special Requests PATIENT ON FOLLOWING  VANCY  Final   Gram Stain   Final    ABUNDANT WBC PRESENT, PREDOMINANTLY PMN RARE SQUAMOUS EPITHELIAL CELLS PRESENT MODERATE GRAM POSITIVE COCCI IN PAIRS RARE GRAM POSITIVE RODS RARE GRAM NEGATIVE RODS    Culture CULTURE REINCUBATED FOR BETTER GROWTH  Final   Report Status PENDING  Incomplete    Culture still pending from bronchial lavage yesterday  ON  po vancomycin only for cdiff  Scott Isaac MD  Beeper [phone] Office [phone] 11/10/2016 6:02 PM

## 2016-11-10 NOTE — Progress Notes (Addendum)
Nutrition Follow Up  DOCUMENTATION CODES:   Obesity unspecified  INTERVENTION:    Continue Vital High Protein at goal rate of 55 ml/h with Prostat 30 ml BID to provide 1520 kcals, 145 gm protein, 1103 ml free water daily  NUTRITION DIAGNOSIS:   Inadequate oral intake related to inability to eat as evidenced by NPO status, ongoing  GOAL:    Provide needs based on ASPEN/SCCM guidelines, met   MONITOR:   TF tolerance, Vent status, Labs, Weight trends, Skin, I & O's  ASSESSMENT:   70 yo former smoker with known CAD and ascending aorta aneurysm, who was taken to OR 11/30 for cabg x 3, aortic root replacement, MVR replacement, thoracic cavity left open and had extended pump time in OR. On APRV ventilation post op, CI 1.6 and multiple pressors. PCCM asked to manage vent.   Pt s/p procedures 11/30: AORTIC VALVE REPLACEMENT MITRAL VALVE REPAIR REPAIR OF ASCENDING THORACIC AORTIC ANEURYSM CORONARY ARTERY BYPASS GRAFTING x 3  Patient is currently intubated on ventilator support >>> trach  Temp (24hrs), Avg:100.2 F (37.9 C), Min:99.3 F (37.4 C), Max:101.5 F (38.6 C)  Vital High Protein currently infusing at goal rate of 55 ml/hr via CORTRAK small bore feeding tube. Acute metabolic encephalopathy improved >>> more alert. S/p bronchoscopy with endobronchial lavage 12/18. Started pressure support trials. CBG's [phone].  Diet Order:  Diet NPO time specified  Skin:  Wound (see comment) (surgical incision )  Last BM:  12/19  Height:  Ht Readings from Last 1 Encounters:  10/23/16 _0  (1.753 m)    Weight: >>> fluctuating but stable  Wt Readings from Last 1 Encounters:  11/10/16 246 lb 7.6 oz (111.8 kg)    Ideal Body Weight:  72.7 kg  BMI:  Body mass index is 36.4 kg/m.  Estimated Nutritional Needs:   Kcal:  5001-6429  Protein:  145-155 gm  Fluid:  per MD  EDUCATION NEEDS:   No education needs identified at this time  Arthur Holms, RD, LDN Pager  #: [phone] After-Hours Pager #: [phone]

## 2016-11-10 NOTE — Care Management Note (Signed)
Case Management Note  Patient Details  Name: Scott [name] MRN: [id number] Date of Birth: [birth year]  Subjective/Objective:   S/p heart cath, pta indep, lives with wife.  NCM will cont to follow for dc needs.                  Action/Plan:   Expected Discharge Date:                  Expected Discharge Plan:  Home/Self Care  In-House Referral:     Discharge planning Services  CM Consult  Post Acute Care Choice:    Choice offered to:     DME Arranged:    DME Agency:     HH Arranged:    HH Agency:     Status of Service:  Completed, signed off  If discussed at [employer] of Stay Meetings, dates discussed:    Additional Comments: 11/10/2016  CM has been in constant contact with wife throughout hospital stay. Wife is in agreement with discharge to post acute facility whether it be LTACH or SNF.   Pt was trached on yesterday.  Pt discussed in LOS 11/10/16 and pt has been deemed appropriate for [facility] referral per physician advisior.  CM spoke with attending group and relayed that pt would be appropriate for [facility] referral - group will follow back up with CM when pt is closer towards discharge  Both [facility] received referral from Physician Advisor during LOS meeting.    11/03/16 Discussed in LOS 11/03/16:  Remains appropriate for continued stay.  Pt remains on milrinone, IV lasix, aggristat and IV antibiotics  10/29/16 Discussed in LOS 10/29/16:  Pt remains appropriate for continued stay.  3 days s/p sternal washout and closure.  Remains on milrinone, lasix drip Maryclare Labrador, RN 11/10/2016, 9:32 AM

## 2016-11-10 NOTE — Progress Notes (Signed)
Subjective:  Patient much more alert and interactive   Antibiotics:  Anti-infectives    Start     Dose/Rate Route Frequency Ordered Stop   11/04/16 0845  metroNIDAZOLE (FLAGYL) 50 mg/ml oral suspension 500 mg     500 mg Per Tube 3 times daily 11/04/16 0831     11/03/16 2000  vancomycin (VANCOCIN) 50 mg/mL oral solution 500 mg     500 mg Per Tube Every 6 hours 11/03/16 1812 11/17/16 1759   11/01/16 1400  acyclovir (ZOVIRAX) 200 MG/5ML suspension SUSP 200 mg     200 mg Oral 4 times daily 11/01/16 1200     10/30/16 0927  anidulafungin (ERAXIS) 100 mg in sodium chloride 0.9 % 100 mL IVPB  Status:  Discontinued     100 mg over 90 Minutes Intravenous Every 24 hours 10/29/16 0927 10/31/16 1430   10/29/16 1000  meropenem (MERREM) 1 g in sodium chloride 0.9 % 100 mL IVPB  Status:  Discontinued     1 g 200 mL/hr over 30 Minutes Intravenous Every 8 hours 10/29/16 0931 10/31/16 1430   10/29/16 0930  anidulafungin (ERAXIS) 200 mg in sodium chloride 0.9 % 200 mL IVPB     200 mg over 180 Minutes Intravenous  Once 10/29/16 0927 10/29/16 1349   10/29/16 0600  vancomycin (VANCOCIN) 1,250 mg in sodium chloride 0.9 % 250 mL IVPB  Status:  Discontinued     1,250 mg 166.7 mL/hr over 90 Minutes Intravenous Every 24 hours 10/28/16 0900 11/02/16 0945   10/28/16 0600  vancomycin (VANCOCIN) IVPB 750 mg/150 ml premix  Status:  Discontinued     750 mg 150 mL/hr over 60 Minutes Intravenous Every 12 hours 10/27/16 1226 10/28/16 0900   10/27/16 1400  cefTRIAXone (ROCEPHIN) 2 g in dextrose 5 % 50 mL IVPB  Status:  Discontinued     2 g 100 mL/hr over 30 Minutes Intravenous Every 24 hours 10/27/16 1203 10/29/16 0907   10/27/16 1400  rifampin (RIFADIN) 300 mg in sodium chloride 0.9 % 100 mL IVPB  Status:  Discontinued     300 mg 200 mL/hr over 30 Minutes Intravenous Every 8 hours 10/27/16 1226 10/27/16 1426   10/27/16 1330  vancomycin (VANCOCIN) 2,000 mg in sodium chloride 0.9 % 500 mL IVPB     2,000  mg 250 mL/hr over 120 Minutes Intravenous  Once 10/27/16 1226 10/27/16 1451   10/27/16 1000  levofloxacin (LEVAQUIN) IVPB 750 mg  Status:  Discontinued     750 mg 100 mL/hr over 90 Minutes Intravenous Every 24 hours 10/26/16 1054 10/27/16 1203   10/26/16 1815  vancomycin (VANCOCIN) IVPB 1000 mg/200 mL premix     1,000 mg 200 mL/hr over 60 Minutes Intravenous  Once 10/26/16 1054 10/26/16 1752   10/26/16 0954  polymyxin B 500,000 Units, bacitracin 50,000 Units in sodium chloride irrigation 0.9 % 500 mL irrigation  Status:  Discontinued       As needed 10/26/16 0954 10/26/16 1030   10/26/16 0400  vancomycin (VANCOCIN) 1,500 mg in sodium chloride 0.9 % 250 mL IVPB     1,500 mg 125 mL/hr over 120 Minutes Intravenous To Surgery 10/25/16 1145 10/26/16 0925   10/26/16 0400  vancomycin (VANCOCIN) 1,000 mg in sodium chloride 0.9 % 1,000 mL irrigation      Irrigation To Surgery 10/25/16 1145 10/26/16 0953   10/26/16 0400  levofloxacin (LEVAQUIN) IVPB 500 mg     500 mg 100 mL/hr over 60  Minutes Intravenous To Surgery 10/25/16 1145 10/26/16 0925   10/23/16 1000  levofloxacin (LEVAQUIN) IVPB 750 mg     750 mg 100 mL/hr over 90 Minutes Intravenous Every 24 hours 10/22/16 2130 10/23/16 1047   10/23/16 0515  vancomycin (VANCOCIN) IVPB 1000 mg/200 mL premix     1,000 mg 200 mL/hr over 60 Minutes Intravenous  Once 10/22/16 2130 10/23/16 0600   10/22/16 2045  vancomycin (VANCOCIN) 1,250 mg in sodium chloride 0.9 % 250 mL IVPB  Status:  Discontinued     1,250 mg 166.7 mL/hr over 90 Minutes Intravenous To Surgery 10/22/16 2043 10/22/16 2130   10/22/16 2045  levofloxacin (LEVAQUIN) IVPB 500 mg  Status:  Discontinued     500 mg 100 mL/hr over 60 Minutes Intravenous To Surgery 10/22/16 2043 10/22/16 2130   10/22/16 0400  vancomycin (VANCOCIN) 1,250 mg in sodium chloride 0.9 % 250 mL IVPB     1,250 mg 166.7 mL/hr over 90 Minutes Intravenous To Surgery 10/21/16 1305 10/22/16 2247   10/22/16 0400  vancomycin  (VANCOCIN) 1,000 mg in sodium chloride 0.9 % 1,000 mL irrigation      Irrigation To Surgery 10/21/16 1305 10/22/16 0819   10/22/16 0400  levofloxacin (LEVAQUIN) IVPB 500 mg     500 mg 100 mL/hr over 60 Minutes Intravenous To Surgery 10/21/16 1305 10/22/16 2114      Medications: Scheduled Meds: . acyclovir  200 mg Oral QID  . amiodarone  400 mg Per Tube BID  . aspirin EC  325 mg Oral Daily   Or  . aspirin  324 mg Per Tube Daily  . atorvastatin  40 mg Per Tube q1800  . chlorhexidine gluconate (MEDLINE KIT)  15 mL Mouth Rinse BID  . famotidine  20 mg Per Tube Q12H  . feeding supplement (PRO-STAT SUGAR FREE 64)  30 mL Per Tube BID  . free water  400 mL Per Tube Q6H  . insulin aspart  0-24 Units Subcutaneous Q4H  . insulin glargine  15 Units Subcutaneous BID  . mouth rinse  15 mL Mouth Rinse QID  . metroNIDAZOLE  500 mg Per Tube TID  . potassium chloride  40 mEq Per Tube Q6H  . sodium chloride flush  3 mL Intravenous Q12H  . vancomycin  500 mg Per Tube Q6H  . warfarin  2 mg Per Tube q1800  . Warfarin - Physician Dosing Inpatient   Does not apply q1800   Continuous Infusions: . sodium chloride 10 mL/hr at 11/10/16 1200  . dexmedetomidine 0.401 mcg/kg/hr (11/10/16 1200)  . feeding supplement (VITAL HIGH PROTEIN) 1,000 mL (11/10/16 1200)  . fentaNYL infusion INTRAVENOUS 300 mcg/hr (11/10/16 1200)  . furosemide (LASIX) infusion 10 mg/hr (11/10/16 1200)  . heparin 2,100 Units/hr (11/10/16 1200)   PRN Meds:.Gerhardt's butt cream, ipratropium-albuterol, metoprolol, midazolam, morphine injection, ondansetron (ZOFRAN) IV, sodium chloride flush    Objective: Weight change: -7 lb 4.4 oz (-3.3 kg)  Intake/Output Summary (Last 24 hours) at 11/10/16 1223 Last data filed at 11/10/16 1200  Gross per 24 hour  Intake          3669.58 ml  Output             7260 ml  Net         -3590.42 ml   Blood pressure 133/86, pulse (!) 108, temperature (!) 100.6 F (38.1 C), temperature source Core  (Comment), resp. rate 17, height 5' 9" (1.753 m), weight 246 lb 7.6 oz (111.8 kg), SpO2 100 %.  Temp:  [99.3 F (37.4 C)-100.6 F (38.1 C)] 100.6 F (38.1 C) (12/19 1200) Pulse Rate:  [37-114] 108 (12/19 1200) Resp:  [10-26] 17 (12/19 1200) BP: (71-148)/(54-95) 133/86 (12/19 1200) SpO2:  [96 %-100 %] 100 % (12/19 1200) FiO2 (%):  [40 %-60 %] 40 % (12/19 1200) Weight:  [246 lb 7.6 oz (111.8 kg)] 246 lb 7.6 oz (111.8 kg) (12/19 0335)  Physical Exam: General: In restraints, sedated  HEENT: anicteric sclera, EOMI CVS tachycardic rate, S2 wound is clean Chest: Fairly clear to auscultation anteriorly Abdomen: soft tended, normal bowel sounds, liquid dark stool in rectal bed Extremities: cyanotic fingers and toes   Neuro: nonfocal  CBC: CBC Latest Ref Rng & Units 11/10/2016 11/09/2016 11/08/2016  WBC 4.0 - 10.5 K/uL 9.1 8.8 10.6(H)  Hemoglobin 13.0 - 17.0 g/dL 10.4(L) 8.9(L) 10.0(L)  Hematocrit 39.0 - 52.0 % 36.5(L) 31.3(L) 35.3(L)  Platelets 150 - 400 K/uL 278 272 302     BMET  Recent Labs  11/09/16 0432 11/10/16 0212  NA 150* 151*  K 4.1 4.5  CL 114* 112*  CO2 31 34*  GLUCOSE 94 127*  BUN 44* 44*  CREATININE 0.96 1.01  CALCIUM 7.6* 8.0*     Liver Panel   Recent Labs  11/09/16 0432  PROT 4.9*  ALBUMIN 1.6*  AST 22  ALT 27  ALKPHOS 73  BILITOT 0.9       Sedimentation Rate No results for input(s): ESRSEDRATE in the last 72 hours. C-Reactive Protein No results for input(s): CRP in the last 72 hours.  Micro Results: Recent Results (from the past 720 hour(s))  Surgical pcr screen     Status: None   Collection Time: 10/21/16  4:18 AM  Result Value Ref Range Status   MRSA, PCR NEGATIVE NEGATIVE Final   Staphylococcus aureus NEGATIVE NEGATIVE Final    Comment:        The Xpert SA Assay (FDA approved for NASAL specimens in patients over 35 years of age), is one component of a comprehensive surveillance program.  Test performance has been validated  by [facility] for patients greater than or equal to 52 year old. It is not intended to diagnose infection nor to guide or monitor treatment.   Culture, blood (Routine X 2) w Reflex to ID Panel     Status: None   Collection Time: 10/23/16  3:18 PM  Result Value Ref Range Status   Specimen Description BLOOD RIGHT HAND  Final   Special Requests IN PEDIATRIC BOTTLE 1CC  Final   Culture NO GROWTH 6 DAYS  Final   Report Status 10/29/2016 FINAL  Final  Culture, blood (Routine X 2) w Reflex to ID Panel     Status: None   Collection Time: 10/23/16  3:23 PM  Result Value Ref Range Status   Specimen Description BLOOD RIGHT ANTECUBITAL  Final   Special Requests IN PEDIATRIC BOTTLE 3CC  Final   Culture NO GROWTH 6 DAYS  Final   Report Status 10/29/2016 FINAL  Final  Culture, blood (routine x 2)     Status: None   Collection Time: 10/26/16 12:45 PM  Result Value Ref Range Status   Specimen Description BLOOD RIGHT ANTECUBITAL  Final   Special Requests IN PEDIATRIC BOTTLE 1.5CC  Final   Culture NO GROWTH 5 DAYS  Final   Report Status 10/31/2016 FINAL  Final  Culture, blood (routine x 2)     Status: None   Collection Time: 10/26/16 12:50 PM  Result  Value Ref Range Status   Specimen Description BLOOD RIGHT HAND  Final   Special Requests IN PEDIATRIC BOTTLE 3CC  Final   Culture NO GROWTH 5 DAYS  Final   Report Status 10/31/2016 FINAL  Final  Culture, blood (Routine X 2) w Reflex to ID Panel     Status: None   Collection Time: 10/29/16  2:47 AM  Result Value Ref Range Status   Specimen Description BLOOD RIGHT HAND  Final   Special Requests IN PEDIATRIC BOTTLE 1CC  Final   Culture NO GROWTH 5 DAYS  Final   Report Status 11/03/2016 FINAL  Final  Culture, blood (Routine X 2) w Reflex to ID Panel     Status: None   Collection Time: 10/29/16  5:51 AM  Result Value Ref Range Status   Specimen Description BLOOD RIGHT ARM  Final   Special Requests BOTTLES DRAWN AEROBIC AND ANAEROBIC 5CC   Final    Culture NO GROWTH 5 DAYS  Final   Report Status 11/03/2016 FINAL  Final  Culture, respiratory (NON-Expectorated)     Status: None   Collection Time: 10/29/16 10:21 AM  Result Value Ref Range Status   Specimen Description TRACHEAL ASPIRATE  Final   Special Requests NONE  Final   Gram Stain   Final    FEW WBC PRESENT, PREDOMINANTLY PMN MODERATE GRAM NEGATIVE RODS RARE GRAM POSITIVE COCCI    Culture Consistent with normal respiratory flora.  Final   Report Status 10/31/2016 FINAL  Final  Culture, respiratory (NON-Expectorated)     Status: None   Collection Time: 10/31/16 12:37 PM  Result Value Ref Range Status   Specimen Description TRACHEAL ASPIRATE  Final   Special Requests Normal  Final   Gram Stain   Final    RARE WBC PRESENT, PREDOMINANTLY MONONUCLEAR RARE GRAM NEGATIVE COCCOBACILLI    Culture RARE CANDIDA ALBICANS  Final   Report Status 11/03/2016 FINAL  Final  Culture, blood (single) w Reflex to ID Panel     Status: None   Collection Time: 10/31/16  2:08 PM  Result Value Ref Range Status   Specimen Description BLOOD RIGHT ANTECUBITAL  Final   Special Requests IN PEDIATRIC BOTTLE 3CC  Final   Culture NO GROWTH 5 DAYS  Final   Report Status 11/05/2016 FINAL  Final  Aerobic Culture (superficial specimen)     Status: None   Collection Time: 11/03/16  8:46 AM  Result Value Ref Range Status   Specimen Description WOUND RIGHT THIGH  Final   Special Requests NONE  Final   Gram Stain   Final    RARE WBC PRESENT, PREDOMINANTLY PMN NO ORGANISMS SEEN    Culture NO GROWTH 2 DAYS  Final   Report Status 11/06/2016 FINAL  Final  Culture, blood (routine x 2)     Status: None   Collection Time: 11/03/16  5:50 PM  Result Value Ref Range Status   Specimen Description BLOOD RIGHT ANTECUBITAL  Final   Special Requests BOTTLES DRAWN AEROBIC AND ANAEROBIC 10ML EACH  Final   Culture NO GROWTH 5 DAYS  Final   Report Status 11/08/2016 FINAL  Final  Culture, blood (routine x 2)     Status:  None   Collection Time: 11/03/16  5:58 PM  Result Value Ref Range Status   Specimen Description BLOOD RIGHT ANTECUBITAL  Final   Special Requests BOTTLES DRAWN AEROBIC AND ANAEROBIC 10CC EACH  Final   Culture NO GROWTH 5 DAYS  Final   Report  Status 11/08/2016 FINAL  Final  C difficile quick scan w PCR reflex     Status: Abnormal   Collection Time: 11/03/16  6:21 PM  Result Value Ref Range Status   C Diff antigen POSITIVE (A) NEGATIVE Final   C Diff toxin POSITIVE (A) NEGATIVE Final   C Diff interpretation Toxin producing C. difficile detected.  Final    Comment: CRITICAL RESULT CALLED TO, READ BACK BY AND VERIFIED WITH: A.HARGETT,RN AT 2322 BY L.PITT 11/03/16   Culture, blood (routine x 2)     Status: None (Preliminary result)   Collection Time: 11/07/16 12:10 PM  Result Value Ref Range Status   Specimen Description BLOOD RIGHT ARM  Final   Special Requests   Final    BOTTLES DRAWN AEROBIC AND ANAEROBIC BLUE 5CC RED 4CC   Culture NO GROWTH 2 DAYS  Final   Report Status PENDING  Incomplete  Culture, blood (routine x 2)     Status: None (Preliminary result)   Collection Time: 11/07/16 12:20 PM  Result Value Ref Range Status   Specimen Description BLOOD RIGHT HAND  Final   Special Requests BOTTLES DRAWN AEROBIC AND ANAEROBIC  5CC EA  Final   Culture NO GROWTH 2 DAYS  Final   Report Status PENDING  Incomplete  Culture, respiratory (NON-Expectorated)     Status: None (Preliminary result)   Collection Time: 11/09/16  8:23 AM  Result Value Ref Range Status   Specimen Description BRONCHIAL ALVEOLAR LAVAGE  Final   Special Requests PATIENT ON FOLLOWING  VANCY  Final   Gram Stain   Final    ABUNDANT WBC PRESENT, PREDOMINANTLY PMN RARE SQUAMOUS EPITHELIAL CELLS PRESENT MODERATE GRAM POSITIVE COCCI IN PAIRS RARE GRAM POSITIVE RODS RARE GRAM NEGATIVE RODS    Culture CULTURE REINCUBATED FOR BETTER GROWTH  Final   Report Status PENDING  Incomplete    Studies/Results: Dg Chest Port 1  View  Result Date: 11/10/2016 CLINICAL DATA:  Tracheostomy tube, status post CABG EXAM: PORTABLE CHEST 1 VIEW COMPARISON:  11/09/2016 FINDINGS: Tracheostomy tube in place. Status post median sternotomy. No pulmonary edema. Improvement in aeration from prior exam. Streaky mild left basilar atelectasis or infiltrate. NG tube in place. Bilateral chest tube unchanged in position. IMPRESSION: Improvement in aeration. Streaky mild left basilar atelectasis or infiltrate. Tracheostomy tube in place. No pulmonary edema. Electronically Signed   By: Lahoma Crocker M.D.   On: 11/10/2016 08:20   Dg Chest Port 1 View  Result Date: 11/09/2016 CLINICAL DATA:  Atelectasis.  Intubated patient. EXAM: PORTABLE CHEST 1 VIEW COMPARISON:  11/08/2016 FINDINGS: Endotracheal tube is 4.8 cm above the carina. Left arm PICC line tip in the upper SVC region. Nasogastric tube extends into the abdomen. Decreased lung volumes compared to the prior examination. Increased parenchymal densities at left lung base. Increased densities at the right lung base most likely associated with volume loss and atelectasis. Cardiac silhouette remains mildly enlarged but stable. Again noted are median sternotomy wires. Bilateral chest drains appear stable. Negative for a pneumothorax. IMPRESSION: Decreased lung volumes with bibasilar atelectasis. Again noted are parenchymal densities in the left lower lung which could also represent airspace disease. Support apparatuses as described.  Negative for pneumothorax. Electronically Signed   By: Markus Daft M.D.   On: 11/09/2016 07:47   Dg Abd Portable 1v  Result Date: 11/09/2016 CLINICAL DATA:  NG tube placement. EXAM: PORTABLE ABDOMEN - 1 VIEW COMPARISON:  10/29/2016 . FINDINGS: Feeding tube noted with tip over the stomach. No  bowel distention. Degenerative changes lumbar spine. IMPRESSION: Feeding tube noted with tip over the stomach.  No bowel distention. Electronically Signed   By: [place]   On:  11/09/2016 17:17      Assessment/Plan:  INTERVAL HISTORY: She is status post bronchoscopy and tracheostomy placement  He is waking up and following commands much better today   Principal Problem:   S/P aortic valve replacement with bioprosthetic valve Active Problems:   Severe aortic stenosis by prior echocardiography   Unstable angina (HCC)   Aortic stenosis   Coronary artery disease involving native coronary artery of native heart with unstable angina pectoris (HCC)   Bicuspid aortic valve   Thoracic ascending aortic aneurysm (HCC)   Mitral regurgitation due to cusp prolapse   S/P ascending aortic aneurysm repair   S/P mitral valve repair   Hx of CABG   Cardiogenic shock (HCC)   Postoperative atrial fibrillation (HCC)   Thrombocytopenia (HCC)   Acute combined systolic and diastolic congestive heart failure (HCC)   Acute respiratory failure (HCC)   DIC (disseminated intravascular coagulation) (HCC)   Elevated LFTs   Acute encephalopathy   FUO (fever of unknown origin)   Enteritis due to Clostridium difficile   Anasarca   Atherosclerosis of native arteries of extremities with gangrene, left leg (HCC)   Atherosclerosis of native arteries of extremities with gangrene, right leg (HCC)   Acute on chronic diastolic CHF (congestive heart failure) ([place])   Chest tube in place    Scott [name] is a 70 y.o. male with  history of aortic valve and mitral valve repair aneurysm repair who became hypercoagulable postoperatively found to have clots on his mitral valve now has been hypotensive with fevers found to have C. difficile colitis.  #1 Clostridium difficile: Continue vancomycin and DC IV flagyl Complete course of vancomycin PO  #2 FUO:  I don't think he has endocarditis and I'm not anxious to start other antibiotics he seems to be improving clinically without other antibiotics beyond what is being used to treat a C. Difficile  #3 ischemic digits from pressors: Will likely  need amputation eventually  I will sign off for now please do not hesitate to call back with questions    LOS: 21 days   Alcide Evener 11/10/2016, 12:23 PM

## 2016-11-10 NOTE — Progress Notes (Signed)
MD made aware of fevers; Tylenol reordered; cooling blanket in place at this time; will cont. To monitor.  Ruben Reason

## 2016-11-11 LAB — BASIC METABOLIC PANEL
ANION GAP: 6 (ref 5–15)
BUN: 43 mg/dL — ABNORMAL HIGH (ref 6–20)
CO2: 35 mmol/L — AB (ref 22–32)
Calcium: 7.8 mg/dL — ABNORMAL LOW (ref 8.9–10.3)
Chloride: 107 mmol/L (ref 101–111)
Creatinine, Ser: 1 mg/dL (ref 0.61–1.24)
GFR calc Af Amer: >60 mL/min (ref >60)
GLUCOSE: 222 mg/dL — AB (ref 65–99)
POTASSIUM: 3.4 mmol/L — AB (ref 3.5–5.1)
Sodium: 148 mmol/L — ABNORMAL HIGH (ref 135–145)

## 2016-11-11 LAB — GLUCOSE, CAPILLARY
GLUCOSE-CAPILLARY: 148 mg/dL — AB (ref 65–99)
GLUCOSE-CAPILLARY: 151 mg/dL — AB (ref 65–99)
GLUCOSE-CAPILLARY: 177 mg/dL — AB (ref 65–99)
GLUCOSE-CAPILLARY: 214 mg/dL — AB (ref 65–99)
GLUCOSE-CAPILLARY: 152 mg/dL — AB (ref 65–99)
GLUCOSE-CAPILLARY: 165 mg/dL — AB (ref 65–99)
Glucose-Capillary: 167 mg/dL — ABNORMAL HIGH (ref 65–99)
Glucose-Capillary: 170 mg/dL — ABNORMAL HIGH (ref 65–99)
Glucose-Capillary: 180 mg/dL — ABNORMAL HIGH (ref 65–99)
Glucose-Capillary: 180 mg/dL — ABNORMAL HIGH (ref 65–99)
Glucose-Capillary: 186 mg/dL — ABNORMAL HIGH (ref 65–99)
Glucose-Capillary: 238 mg/dL — ABNORMAL HIGH (ref 65–99)

## 2016-11-11 LAB — PHOSPHORUS: Phosphorus: 3.6 mg/dL (ref 2.5–4.6)

## 2016-11-11 LAB — CBC
HEMATOCRIT: 33.8 % — AB (ref 39.0–52.0)
Hemoglobin: 9.6 g/dL — ABNORMAL LOW (ref 13.0–17.0)
MCH: 28 pg (ref 26.0–34.0)
MCHC: 28.4 g/dL — AB (ref 30.0–36.0)
MCV: 98.5 fL (ref 78.0–100.0)
Platelets: 233 10*3/uL (ref 150–400)
RBC: 3.43 MIL/uL — ABNORMAL LOW (ref 4.22–5.81)
RDW: 18.7 % — AB (ref 11.5–15.5)
WBC: 9.4 10*3/uL (ref 4.0–10.5)

## 2016-11-11 LAB — HEPARIN LEVEL (UNFRACTIONATED): Heparin Unfractionated: 0.74 IU/mL — ABNORMAL HIGH (ref 0.30–0.70)

## 2016-11-11 LAB — CULTURE, RESPIRATORY W GRAM STAIN: Culture: NORMAL

## 2016-11-11 LAB — PROTIME-INR
INR: 2.61
Prothrombin Time: 28.4 seconds — ABNORMAL HIGH (ref 11.4–15.2)

## 2016-11-11 LAB — CULTURE, RESPIRATORY

## 2016-11-11 LAB — MAGNESIUM: Magnesium: 1.9 mg/dL (ref 1.7–2.4)

## 2016-11-11 MED ORDER — MAGNESIUM SULFATE 2 GM/50ML IV SOLN
2.0000 g | Freq: Once | INTRAVENOUS | Status: AC
  Administered 2016-11-11: 2 g via INTRAVENOUS
  Filled 2016-11-11: qty 50

## 2016-11-11 MED ORDER — WARFARIN - PHARMACIST DOSING INPATIENT
Freq: Every day | Status: DC
  Administered 2016-11-15: 18:00:00

## 2016-11-11 MED ORDER — PHENOL 1.4 % MT LIQD: 1.0000 | OROMUCOSAL | Status: DC | PRN

## 2016-11-11 MED ORDER — SODIUM CHLORIDE 0.9 % IV SOLN
30.0000 meq | Freq: Once | INTRAVENOUS | Status: AC
  Administered 2016-11-11: 30 meq via INTRAVENOUS
  Filled 2016-11-11: qty 15

## 2016-11-11 MED ORDER — INSULIN GLARGINE 100 UNIT/ML ~~LOC~~ SOLN
30.0000 [IU] | Freq: Two times a day (BID) | SUBCUTANEOUS | Status: DC
  Administered 2016-11-11 – 2016-11-13 (×4): 30 [IU] via SUBCUTANEOUS
  Filled 2016-11-11 (×6): qty 0.3

## 2016-11-11 MED ORDER — POTASSIUM CHLORIDE 2 MEQ/ML IV SOLN
30.0000 meq | Freq: Once | INTRAVENOUS | Status: AC
  Administered 2016-11-11: 30 meq via INTRAVENOUS
  Filled 2016-11-11: qty 15

## 2016-11-11 NOTE — Progress Notes (Signed)
[facility] DAILY ICU PROGRESS NOTE                   [address]            [place] [address]          [phone]   2 Days Post-Op Procedure(s) (LRB): TRACHEOSTOMY (N/A) VIDEO BRONCHOSCOPY (N/A)  Total Length of Stay:  LOS: 22 days   Subjective: Pretty alert currently , trying to talk   Objective: Vital signs in last 24 hours: Temp:  [96.1 F (35.6 C)-102.2 F (39 C)] 97 F (36.1 C) (12/20 0900) Pulse Rate:  [25-122] 102 (12/20 0900) Cardiac Rhythm: Sinus tachycardia (12/20 0900) Resp:  [8-28] 15 (12/20 0900) BP: (77-162)/(57-101) 104/75 (12/20 0900) SpO2:  [94 %-100 %] 100 % (12/20 0900) FiO2 (%):  [40 %] 40 % (12/20 0800) Weight:  [235 lb 0.2 oz (106.6 kg)] 235 lb 0.2 oz (106.6 kg) (12/20 0600)  Filed Weights   11/09/16 0436 11/10/16 0335 11/11/16 0600  Weight: 253 lb 12 oz (115.1 kg) 246 lb 7.6 oz (111.8 kg) 235 lb 0.2 oz (106.6 kg)    Weight change: -11 lb 7.4 oz (-5.2 kg)   Hemodynamic parameters for last 24 hours: CVP:  [7 mmHg-13 mmHg] 13 mmHg  Intake/Output from previous day: 12/19 0701 - 12/20 0700 In: 6011.7 [I.V.:2256.7; NG/GT:3490; IV Piggyback:265] Out: 6834 [HDQQI:2979; Stool:600; Chest Tube:150]  Intake/Output this shift: Total I/O In: 236.4 [I.V.:126.4; NG/GT:110] Out: 250 [Urine:250]  Current Meds: Scheduled Meds: . acyclovir  200 mg Oral QID  . amiodarone  400 mg Per Tube BID  . aspirin EC  325 mg Oral Daily   Or  . aspirin  324 mg Per Tube Daily  . atorvastatin  40 mg Per Tube q1800  . chlorhexidine gluconate (MEDLINE KIT)  15 mL Mouth Rinse BID  . famotidine  20 mg Per Tube Q12H  . feeding supplement (PRO-STAT SUGAR FREE 64)  30 mL Per Tube BID  . free water  400 mL Per Tube Q6H  . insulin aspart  0-24 Units Subcutaneous Q4H  . insulin glargine  15 Units Subcutaneous BID  . mouth rinse  15 mL Mouth Rinse QID  . potassium chloride  40 mEq Per Tube Q6H  . sodium chloride flush  3 mL Intravenous Q12H  . vancomycin  500  mg Per Tube Q6H  . warfarin  2 mg Per Tube q1800  . Warfarin - Physician Dosing Inpatient   Does not apply q1800   Continuous Infusions: . sodium chloride 10 mL/hr at 11/11/16 0900  . dexmedetomidine 0.3 mcg/kg/hr (11/11/16 0900)  . feeding supplement (VITAL HIGH PROTEIN) 1,000 mL (11/11/16 0900)  . fentaNYL infusion INTRAVENOUS 125 mcg/hr (11/11/16 0900)  . furosemide (LASIX) infusion 10 mg/hr (11/11/16 0900)  . heparin 1,950 Units/hr (11/11/16 0900)   PRN Meds:.acetaminophen (TYLENOL) oral liquid 160 mg/5 mL, Gerhardt's butt cream, ipratropium-albuterol, metoprolol, midazolam, morphine injection, ondansetron (ZOFRAN) IV, sodium chloride flush  General appearance: alert, cooperative and no distress Heart: regular rate and rhythm Lungs: some upper airway coarseness Abdomen: benign Extremities: unchanged Wound: sternal incis healing , right leg incis with some ongoing drainage  Lab Results: CBC: Recent Labs  11/10/16 0212 11/11/16 0400  WBC 9.1 9.4  HGB 10.4* 9.6*  HCT 36.5* 33.8*  PLT 278 233   BMET:  Recent Labs  11/10/16 0212 11/11/16 0400  NA 151* 148*  K 4.5 3.4*  CL 112* 107  CO2 34* 35*  GLUCOSE 127* 222*  BUN 44* 43*  CREATININE 1.01 1.00  CALCIUM 8.0* 7.8*    CMET: Lab Results  Component Value Date   WBC 9.4 11/11/2016   HGB 9.6 (L) 11/11/2016   HCT 33.8 (L) 11/11/2016   PLT 233 11/11/2016   GLUCOSE 222 (H) 11/11/2016   CHOL 134 10/21/2016   TRIG 203 (H) 10/21/2016   HDL 28 (L) 10/21/2016   LDLCALC 65 10/21/2016   ALT 27 11/09/2016   AST 22 11/09/2016   NA 148 (H) 11/11/2016   K 3.4 (L) 11/11/2016   CL 107 11/11/2016   CREATININE 1.00 11/11/2016   BUN 43 (H) 11/11/2016   CO2 35 (H) 11/11/2016   TSH 1.93 10/19/2016   INR 2.61 11/11/2016   HGBA1C 6.6 (H) 10/21/2016    PT/INR:  Recent Labs  11/11/16 0400  LABPROT 28.4*  INR 2.61   Radiology: No results found.   Assessment/Plan: S/P Procedure(s) (LRB): TRACHEOSTOMY (N/A) VIDEO  BRONCHOSCOPY (N/A)   1 slow improvement conts 2 remains febrile, ID is cont current abx for cdiff. pulm cultures are being re-incubated from bronch 4 potassium being replaced 5 AHF assisting with management= conts lasix gtt, sinus tach with bifascicular block 6 vent wean per CCM 7 CT drainage only minimal 8 H/H dropped a little- follow 9 INR 2.6, heparin is now to be discontinued Ketzia Guzek E 11/11/2016 9:22 AM

## 2016-11-11 NOTE — Progress Notes (Signed)
PCCM Progress Note  ADMISSION DATE:  10/20/2016 CONSULTATION DATE: 10/23/2016 REFERRING MD: Ricard Dillon  CHIEF COMPLAINT:  Chest pain  HISTORY OF PRESENT ILLNESS:   70 yo former smoker with known CAD and ascending aorta aneurysm, who was taken to OR 11/30 for cabg x 3, aortic root replacement, MVR replacement, thoracic cavity left open and had extended pump time in OR.  SUBJECTIVE:  No events overnight, tolerating wean well.  VITAL SIGNS: BP 98/60 (BP Location: Right Arm)   Pulse (!) 104   Temp 97 F (36.1 C)   Resp 20   Ht 5\' 9"  (1.753 m)   Wt 106.6 kg (235 lb 0.2 oz)   SpO2 100%   BMI 34.71 kg/m   HEMODYNAMICS: CVP:  [7 mmHg-14 mmHg] 14 mmHg  INTAKE / OUTPUT: I/O last 3 completed shifts: In: 7995.4 [I.V.:3490.4; NG/GT:4240; IV Piggyback:265] Out: [id number]; Stool:900; Chest Tube:310]  PHYSICAL EXAMINATION: General: ill appearing male, NAD  Neuro: RASS -1 not follow commands , sedated  HEENT:  ETT in place Cardiovascular: irregular, chest closed Lungs:  No wheeze Abdomen:  Soft, non tender Ext: 1+ edema, extremities cool Skin: Ischemic changes in fingers/toes  b/l  CMP Latest Ref Rng & Units 11/11/2016 11/10/2016 11/09/2016  Glucose 65 - 99 mg/dL 222(H) 127(H) 94  BUN 6 - 20 mg/dL 43(H) 44(H) 44(H)  Creatinine 0.61 - 1.24 mg/dL 1.00 1.01 0.96  Sodium 135 - 145 mmol/L 148(H) 151(H) 150(H)  Potassium 3.5 - 5.1 mmol/L 3.4(L) 4.5 4.1  Chloride 101 - 111 mmol/L 107 112(H) 114(H)  CO2 22 - 32 mmol/L 35(H) 34(H) 31  Calcium 8.9 - 10.3 mg/dL 7.8(L) 8.0(L) 7.6(L)  Total Protein 6.5 - 8.1 g/dL - - 4.9(L)  Total Bilirubin 0.3 - 1.2 mg/dL - - 0.9  Alkaline Phos 38 - 126 U/L - - 73  AST 15 - 41 U/L - - 22  ALT 17 - 63 U/L - - 27    CBC Latest Ref Rng & Units 11/11/2016 11/10/2016 11/09/2016  WBC 4.0 - 10.5 K/uL 9.4 9.1 8.8  Hemoglobin 13.0 - 17.0 g/dL 9.6(L) 10.4(L) 8.9(L)  Hematocrit 39.0 - 52.0 % 33.8(L) 36.5(L) 31.3(L)  Platelets 150 - 400 K/uL 233 278 272     ABG    Component Value Date/Time   PHART 7.437 11/10/2016 0340   PCO2ART 45.6 11/10/2016 0340   PO2ART 100 11/10/2016 0340   HCO3 29.9 (H) 11/10/2016 0340   TCO2 36 11/04/2016 1950   ACIDBASEDEF 1.0 10/27/2016 1125   O2SAT 97.3 11/10/2016 0340    CBG (last 3)   Recent Labs  11/11/16 0014 11/11/16 0404 11/11/16 0754  GLUCAP 238* 214* 152*    Imaging: Dg Chest Port 1 View  Result Date: 11/10/2016 CLINICAL DATA:  Tracheostomy tube, status post CABG EXAM: PORTABLE CHEST 1 VIEW COMPARISON:  11/09/2016 FINDINGS: Tracheostomy tube in place. Status post median sternotomy. No pulmonary edema. Improvement in aeration from prior exam. Streaky mild left basilar atelectasis or infiltrate. NG tube in place. Bilateral chest tube unchanged in position. IMPRESSION: Improvement in aeration. Streaky mild left basilar atelectasis or infiltrate. Tracheostomy tube in place. No pulmonary edema. Electronically Signed   By: Lahoma Crocker M.D.   On: 11/10/2016 08:20   Dg Abd Portable 1v  Result Date: 11/09/2016 CLINICAL DATA:  NG tube placement. EXAM: PORTABLE ABDOMEN - 1 VIEW COMPARISON:  10/29/2016 . FINDINGS: Feeding tube noted with tip over the stomach. No bowel distention. Degenerative changes lumbar spine. IMPRESSION: Feeding tube noted with tip  over the stomach.  No bowel distention. Electronically Signed   By: Marcello Moores  Register   On: 11/09/2016 17:17    STUDIES:  12.5 dopplers lowers>>>neg 12/6 dopplers arms >>>neg 12/7 abdo us>>>Cholelithiasis without sonographic evidence for acute cholecystitis. No biliary dilatation.Increased hepatic echogenicity, suggestive of steatosis. Ascites. 12/7 echo>>>60-65%, rv poor images but appeared wnl  CULTURES: C diff 12/12 >> positive Blood 12/12 >> Rt thigh wound 12/12 >> neg   ANTIBIOTICS:  Acyclovir 12/10 >> PO vancomycin 12/12 >> Flagyl 12/13 >>  SIGNIFICANT EVENTS: 11/30 surgery, hypoxia 12/4 closed chest 12/7 high fevers, LFt  up  LINES/TUBES: 11/30 ETT>>12/18 Trach (CVTS) 12/18>>> 12/04 picc left >>> 11/30 CT x 4>> R CT x 2 12/16>>>  ASSESSMENT / PLAN:  Acute hypoxic respiratory failure after CABG with MVR. - TC while awake then full support at night tonight. - Titrate O2 for sat of 88-92% - F/u CXR and ABG as needed at this point.  CAD s/p CABG, MVR, aortic root replacement. A fib. Rt atrial clot. - PO amiodarone, ASA - Continue Lasix gtt at 10 mg/hr. - Add zaroxolyn x1 dose. - Heparin gtt per pharmacy. - Acetazolamide 250 mg IV q8 x2 doses.  B/l hand/feet ischemia. - Will need further therapy when more stable  C diff colitis. - Flagyl, enteral vancomycin per ID  Anemia of critical illness. - F/u CBC  DM. - SSI with lantus  Acute metabolic encephalopathy. - RASS goal 0 to -1 - Continue precedex - Fentanyl drip for pain control  DVT prophylaxis - heparin gtt SUP - Pepcid Nutrition - tubes feeds Goals of care - Full code  Resolved problems >> DIC  No family bedside to updated.  Maintain on TC as tolerated.  The patient is critically ill with multiple organ systems failure and requires high complexity decision making for assessment and support, frequent evaluation and titration of therapies, application of advanced monitoring technologies and extensive interpretation of multiple databases.   Critical Care Time devoted to patient care services described in this note is  35  Minutes. This time reflects time of care of this signee Dr Jennet Maduro. This critical care time does not reflect procedure time, or teaching time or supervisory time of PA/NP/Med student/Med Resident etc but could involve care discussion time.  Rush Farmer, M.D. [facility] Pulmonary/Critical Care Medicine. Pager: [phone]. After hours pager: [phone].

## 2016-11-11 NOTE — Progress Notes (Signed)
CT surgery p.m. Rounds  Patient's fever has been improved today Mild bleeding from tracheostomy site Heparin has been stopped, INR is now therapeutic Back on ventilator resting comfortably

## 2016-11-11 NOTE — Progress Notes (Signed)
Patient requested to be placed back on ventilator.  Placed on vent, on full support.  Currently tolerating well.  Will continue to monitor.

## 2016-11-11 NOTE — Progress Notes (Signed)
ANTICOAGULATION CONSULT NOTE   Pharmacy Consult for Coumadin Indication: RA clot and afib   Allergies  Allergen Reactions  . Doxycycline Hives  . Penicillins Hives     Has patient had a PCN reaction causing immediate rash, facial/tongue/throat swelling, SOB or lightheadedness with hypotension: No Has patient had a PCN reaction causing severe rash involving mucus membranes or skin necrosis: No Has patient had a PCN reaction that required hospitalization: No Has patient had a PCN reaction occurring within the last 10 years:# # # YES # # #  If all of the above answers are "NO", then may proceed with Cephalosporin use.   . Sulfa Antibiotics Hives    Patient Measurements: Height: 5\' 9"  (175.3 cm) Weight: 235 lb 0.2 oz (106.6 kg) IBW/kg (Calculated) : 70.7  Heparin dosing wt: 97 kg  Vital Signs: Temp: 97 F (36.1 C) (12/20 1122) Temp Source: Core (Comment) (12/20 0400) BP: 105/77 (12/20 1000) Pulse Rate: 104 (12/20 1131)  Labs:  Recent Labs  11/09/16 0432 11/10/16 0212 11/10/16 1212 11/11/16 0352 11/11/16 0400  HGB 8.9* 10.4*  --   --  9.6*  HCT 31.3* 36.5*  --   --  33.8*  PLT 272 278  --   --  233  LABPROT  --   --   --   --  28.4*  INR  --   --   --   --  2.61  HEPARINUNFRC  --  0.23* 0.54 0.74*  --   CREATININE 0.96 1.01  --   --  1.00    Estimated Creatinine Clearance: 82.7 mL/min (by C-G formula based on SCr of 1 mg/dL).  Assessment: 70yom on heparin for possible right atrial clot and afib. HIT ruled out (low heparin antibody and SRA negative).  Heparin level above goal this morning, INR 2.6 so will stop heparin now. CBC stable overall with no bleeding issues noted.   INR is variable, had coumadin last night but that is only dose this admission and INR was therapeutic days ago. So Coagulapathy is present. Will hold today and follow INR closely.    Goal of Therapy: . INR goal 2-3 Heparin level goal 0.3-0.7 units/ml Monitor platelets by anticoagulation  protocol: Yes   Plan:  Stop heparin Hold warfarin today and follow daily INR closely  Erin Hearing PharmD., BCPS Clinical Pharmacist Pager [phone] 11/11/2016 11:50 AM

## 2016-11-11 NOTE — Progress Notes (Signed)
Patient ID: Scott [name], male   DOB: 08/16/1946, 70 y.o.   MRN: [id number]     Advanced Heart Failure Rounding Note  Referring Physician: Dr Roxy Manns Primary Physician: Dr Quintin Alto Primary Cardiologist:  Dr Percival Spanish   Reason for Consultation: Heart Failure   Subjective:    Admitted with chest pain. RHC//LHC with 3 vessel disease. Taken to the OR 11/30 for cabg x 3, repair of thoracic ascending aneurysm, AVR bioprosthetic,  MVR, and open chest with VAC placement.    Chest successfully closed 10/26/16.  Off milrinone and phenylephrine now.      S/p tracheostomy am of 11/09/16.   CVP 13 currently. Remains on lasix 10 mg/hr. I/Os negative, weight coming down but above baseline.       HR 100s, ECG yesterday with sinus tachycardia.     Suspect DIC and not HIT. HIT Ab and SRA negative. Warfarin has been started, off heparin with INR > 2.   ID following. Remains on oral vanc and IV flagyl currently. Now afebrile.   TEE 10/26/16 EF 40-45%, small cavity size, flattened/hypokinetic septum, s/p MV repair with mild MR, mild to moderately decreased RV systolic function. + RA thrombus.   Limited echo 10/29/16 LVEF 60-65%, unable to comment on AVR structure or function with poor windows.   Upper and lower extremity doppler studies: No DVTs  ABIs (12/8): Unable to doppler PTs, or get TBIs.   Objective:   Weight Range: 106.6 kg (235 lb 0.2 oz) Body mass index is 34.71 kg/m.   Vital Signs:   Temp:  [96.1 F (35.6 C)-102.2 F (39 C)] 97 F (36.1 C) (12/20 0900) Pulse Rate:  [25-122] 102 (12/20 0900) Resp:  [8-28] 15 (12/20 0900) BP: (77-162)/(57-101) 104/75 (12/20 0900) SpO2:  [94 %-100 %] 100 % (12/20 0900) FiO2 (%):  [40 %] 40 % (12/20 0800) Weight:  [106.6 kg (235 lb 0.2 oz)] 106.6 kg (235 lb 0.2 oz) (12/20 0600) Last BM Date: 11/11/16  Weight change: Filed Weights   11/09/16 0436 11/10/16 0335 11/11/16 0600  Weight: 115.1 kg (253 lb 12 oz) 111.8 kg (246 lb 7.6 oz) 106.6 kg (235 lb  0.2 oz)    Intake/Output:   Intake/Output Summary (Last 24 hours) at 11/11/16 0953 Last data filed at 11/11/16 0900  Gross per 24 hour  Intake          5623.94 ml  Output             8225 ml  Net         -2601.06 ml     Physical Exam: CVP 13 General: Tracheostomy in place.  NAD.  HEENT: + Trach Neck: supple. JVP 11-12. Carotids 2+ bilat; no bruits. No thyromegaly or nodule noted.    Cor: PMI nondisplaced. Mildly tachy, regular.  No M/G/R noted. Chest tubes in place.  Lungs: Scattered rhonchi, Mechanical breathing sounds present.   Abdomen: soft, NT, ND, no HSM. No bruits or masses. +BS  Extremities: no clubbing, rash. 1-2+ edema 1/2 way to knees. Digital gangrene, fingers.  Toes mildly dusky. Unable to doppler pedal pulses.  Neuro: Trachestomy in place. Awake/alert.  GU: Foley in place  Telemetry: Reviewed, sinus tachy around 100 bpm   Labs: CBC  Recent Labs  11/10/16 0212 11/11/16 0400  WBC 9.1 9.4  HGB 10.4* 9.6*  HCT 36.5* 33.8*  MCV 98.9 98.5  PLT 278 403   Basic Metabolic Panel  Recent Labs  11/10/16 0212 11/11/16 0400  NA 151* 148*  K 4.5 3.4*  CL 112* 107  CO2 34* 35*  GLUCOSE 127* 222*  BUN 44* 43*  CREATININE 1.01 1.00  CALCIUM 8.0* 7.8*  MG 2.2 1.9  PHOS 3.5 3.6   Liver Function Tests  Recent Labs  11/09/16 0432  AST 22  ALT 27  ALKPHOS 73  BILITOT 0.9  PROT 4.9*  ALBUMIN 1.6*   No results for input(s): LIPASE, AMYLASE in the last 72 hours. Cardiac Enzymes No results for input(s): CKTOTAL, CKMB, CKMBINDEX, TROPONINI in the last 72 hours.  BNP: BNP (last 3 results) No results for input(s): BNP in the last 8760 hours.  ProBNP (last 3 results) No results for input(s): PROBNP in the last 8760 hours.   D-Dimer No results for input(s): DDIMER in the last 72 hours. Hemoglobin A1C No results for input(s): HGBA1C in the last 72 hours. Fasting Lipid Panel No results for input(s): CHOL, HDL, LDLCALC, TRIG, CHOLHDL, LDLDIRECT in the  last 72 hours. Thyroid Function Tests No results for input(s): TSH, T4TOTAL, T3FREE, THYROIDAB in the last 72 hours.  Invalid input(s): FREET3  Other results:  Imaging/Studies:  Dg Chest Port 1 View  Result Date: 11/10/2016 CLINICAL DATA:  Tracheostomy tube, status post CABG EXAM: PORTABLE CHEST 1 VIEW COMPARISON:  11/09/2016 FINDINGS: Tracheostomy tube in place. Status post median sternotomy. No pulmonary edema. Improvement in aeration from prior exam. Streaky mild left basilar atelectasis or infiltrate. NG tube in place. Bilateral chest tube unchanged in position. IMPRESSION: Improvement in aeration. Streaky mild left basilar atelectasis or infiltrate. Tracheostomy tube in place. No pulmonary edema. Electronically Signed   By: Lahoma Crocker M.D.   On: 11/10/2016 08:20   Dg Abd Portable 1v  Result Date: 11/09/2016 CLINICAL DATA:  NG tube placement. EXAM: PORTABLE ABDOMEN - 1 VIEW COMPARISON:  10/29/2016 . FINDINGS: Feeding tube noted with tip over the stomach. No bowel distention. Degenerative changes lumbar spine. IMPRESSION: Feeding tube noted with tip over the stomach.  No bowel distention. Electronically Signed   By: [place]   On: 11/09/2016 17:17    Medications:     Scheduled Medications: . acyclovir  200 mg Oral QID  . amiodarone  400 mg Per Tube BID  . aspirin EC  325 mg Oral Daily   Or  . aspirin  324 mg Per Tube Daily  . atorvastatin  40 mg Per Tube q1800  . chlorhexidine gluconate (MEDLINE KIT)  15 mL Mouth Rinse BID  . famotidine  20 mg Per Tube Q12H  . feeding supplement (PRO-STAT SUGAR FREE 64)  30 mL Per Tube BID  . free water  400 mL Per Tube Q6H  . insulin aspart  0-24 Units Subcutaneous Q4H  . insulin glargine  30 Units Subcutaneous BID  . magnesium sulfate 1 - 4 g bolus IVPB  2 g Intravenous Once  . mouth rinse  15 mL Mouth Rinse QID  . potassium chloride  40 mEq Per Tube Q6H  . potassium chloride (KCL MULTIRUN) 30 mEq in 265 mL IVPB  30 mEq  Intravenous Once  . sodium chloride flush  3 mL Intravenous Q12H  . vancomycin  500 mg Per Tube Q6H    Infusions: . sodium chloride 10 mL/hr at 11/11/16 0900  . dexmedetomidine 0.3 mcg/kg/hr (11/11/16 0900)  . feeding supplement (VITAL HIGH PROTEIN) 1,000 mL (11/11/16 0900)  . fentaNYL infusion INTRAVENOUS 125 mcg/hr (11/11/16 0900)  . furosemide (LASIX) infusion 10 mg/hr (11/11/16 0900)    PRN Medications: acetaminophen (TYLENOL) oral  liquid 160 mg/5 mL, Gerhardt's butt cream, ipratropium-albuterol, metoprolol, midazolam, morphine injection, ondansetron (ZOFRAN) IV, sodium chloride flush   Assessment/Plan   Mr Dery is 70 year old admitted with CP. Complex operation this admission.  CABG x 3 + bioprosthetic AVR for severe bicuspid AS + ascending aorta replacement + MV repair for flail P2.  MV repair complicated by severe SAM, ended up having to remove posterior annuloplasty band.  Chest closed on 12/3.   1. CAD:  10/22/16 S/P CABG x3 with chest left open.  Chest successfully closed am of 10/26/16. - Continue ASA - Started atorvastatin 40 daily with LFTs back to normal range when last checked.  2. Severe aortic stenosis: 11/302017 S/P AVR bioprostheic  3. Mitral regurgitation: Partial flail leaflet with MV repair.  Developed SAM initially after repair and posterior annuloplasty ring had to be removed.  SAM resolved. 4. Acute on chronic systolic CHF:  TEE at time of chest closure showed EF 40-45%, small cavity size, flattened/hypokinetic septum, s/p MV repair with mild MR, mild to moderately decreased RV systolic function, thrombus in RA. Repeat echo 12/7 with EF 60-65%.  Weaned off off milrinone and phenylephrine.  CVP 13, weight remains much above baseline but coming down.  Creatinine stable.   - Continue Lasix 10 mg/hr.  Good diuresis with stable renal function.  5. ID: ?Source of fever => ID following, do not think endocarditis. Afebrile over the last day.  - Concerned digital  gangrene is a contributor.  - Currently on po vanc and IV flagyl for C difficile.   6. Elevated LFTs: Likely shock liver.  Now back down to WNL.  - Statin restarted.    7. Heme: HIT negative, suspect DIC. Platelet count has recovered.  - warfarin for RA thrombus and atrial fibrillation => INR > 2, off heparin now.   8. VDRF: CCM following, suspect pulmonary edema playing a role. S/p tracheostomy am of 11/09/16.  9. Atrial fibrillation/flutter: Currently sinus tachycardia (based on yesterday's ECG).   - Warfarin.   - Continue po amiodarone.   10. PAD: Finger gangrene, mottled toes.  Unable to doppler PT pulses or get TBIs.  May be source of fever.  Seen by Dr Sharol Given, eventually will need amputation of most digits at PIP joint.   Loralie Champagne, MD 11/11/2016 9:53 AM

## 2016-11-11 NOTE — Progress Notes (Signed)
Patient placed on 40% trach collar.  Currently tolerating well.  Will continue to monitor.  

## 2016-11-11 NOTE — Progress Notes (Addendum)
      [address]       [place] [address]             [phone]        CARDIOTHORACIC SURGERY PROGRESS NOTE   R2 Days Post-Op Procedure(s) (LRB): TRACHEOSTOMY (N/A) VIDEO BRONCHOSCOPY (N/A)  Subjective: More alert.  Currently looks comfortable on T-collar  Objective: Vital signs: BP Readings from Last 1 Encounters:  11/11/16 (!) 80/57   Pulse Readings from Last 1 Encounters:  11/11/16 100   Resp Readings from Last 1 Encounters:  11/11/16 12   Temp Readings from Last 1 Encounters:  11/11/16 (!) 96.6 F (35.9 C)    Hemodynamics: CVP:  [7 mmHg-13 mmHg] 13 mmHg  Physical Exam:  Rhythm:   sinus  Breath sounds: Scattered rhonchi  Heart sounds:  RRR  Incisions:  Clean and dry  Abdomen:  Soft, non-distended, non-tender, diarrhea reportedly decreased  Extremities:  Warm, well-perfused, appearance of feet and toes slowly improving.  Fingers necrotic   Intake/Output from previous day: 12/19 0701 - 12/20 0700 In: 6011.7 [I.V.:2256.7; NG/GT:3490; IV Piggyback:265] Out: NS:5902236; Stool:600; Chest Tube:150] Intake/Output this shift: No intake/output data recorded.  Lab Results:  CBC: Recent Labs  11/10/16 0212 11/11/16 0400  WBC 9.1 9.4  HGB 10.4* 9.6*  HCT 36.5* 33.8*  PLT 278 233    BMET:  Recent Labs  11/10/16 0212 11/11/16 0400  NA 151* 148*  K 4.5 3.4*  CL 112* 107  CO2 34* 35*  GLUCOSE 127* 222*  BUN 44* 43*  CREATININE 1.01 1.00  CALCIUM 8.0* 7.8*     PT/INR:   Recent Labs  11/11/16 0400  LABPROT 28.4*  INR 2.61    CBG (last 3)   Recent Labs  11/10/16 1949 11/11/16 0014 11/11/16 0404  GLUCAP 180* 238* 214*    ABG    Component Value Date/Time   PHART 7.437 11/10/2016 0340   PCO2ART 45.6 11/10/2016 0340   PO2ART 100 11/10/2016 0340   HCO3 29.9 (H) 11/10/2016 0340   TCO2 36 11/04/2016 1950   ACIDBASEDEF 1.0 10/27/2016 1125   O2SAT 97.3 11/10/2016 0340    CXR: n/a  Assessment/Plan: S/P  Procedure(s) (LRB): TRACHEOSTOMY (N/A) VIDEO BRONCHOSCOPY (N/A)  Slowly improving INR > 2 after only one dose Coumadin - will stop heparin and hold Coumadin today - ask Pharmacy to assist w/ dosing Supplement potassium and magnesium more aggressively Continue free water supplementation Minimize sedation D/C pleural tubes soon if output continues to trend down Increase lantus insulin   Rexene Alberts, MD 11/11/2016 9:00 AM

## 2016-11-11 NOTE — Progress Notes (Signed)
ANTICOAGULATION CONSULT NOTE - Follow Up Consult  Pharmacy Consult for heparin Indication: atrial fibrillation and possible clot in RA   Labs:  Recent Labs  11/09/16 0432 11/10/16 0212 11/10/16 1212 11/11/16 0352 11/11/16 0400  HGB 8.9* 10.4*  --   --  9.6*  HCT 31.3* 36.5*  --   --  33.8*  PLT 272 278  --   --  233  LABPROT  --   --   --   --  28.4*  INR  --   --   --   --  2.61  HEPARINUNFRC  --  0.23* 0.54 0.74*  --   CREATININE 0.96 1.01  --   --   --      Assessment: 70yo male now above goal on heparin after one level at goal; no bleeding noted by RN.  Goal of Therapy:  Heparin level 0.3-0.7 units/ml   Plan:  Will decrease heparin gtt back to 1950 units/hr where pt was previously therapeutic and check level in [place], PharmD, BCPS  11/11/2016,4:42 AM

## 2016-11-12 DIAGNOSIS — I480 Paroxysmal atrial fibrillation: Secondary | ICD-10-CM

## 2016-11-12 LAB — CULTURE, BLOOD (ROUTINE X 2)
Culture: NO GROWTH
Culture: NO GROWTH

## 2016-11-12 LAB — CBC
HEMATOCRIT: 34.1 % — AB (ref 39.0–52.0)
Hemoglobin: 9.7 g/dL — ABNORMAL LOW (ref 13.0–17.0)
MCH: 28 pg (ref 26.0–34.0)
MCHC: 28.4 g/dL — ABNORMAL LOW (ref 30.0–36.0)
MCV: 98.6 fL (ref 78.0–100.0)
Platelets: 216 10*3/uL (ref 150–400)
RBC: 3.46 MIL/uL — ABNORMAL LOW (ref 4.22–5.81)
RDW: 18.6 % — AB (ref 11.5–15.5)
WBC: 9.8 10*3/uL (ref 4.0–10.5)

## 2016-11-12 LAB — PROTIME-INR
INR: 1.81
Prothrombin Time: 21.2 seconds — ABNORMAL HIGH (ref 11.4–15.2)

## 2016-11-12 LAB — GLUCOSE, CAPILLARY
GLUCOSE-CAPILLARY: 131 mg/dL — AB (ref 65–99)
GLUCOSE-CAPILLARY: 75 mg/dL (ref 65–99)
GLUCOSE-CAPILLARY: 143 mg/dL — AB (ref 65–99)
GLUCOSE-CAPILLARY: 68 mg/dL (ref 65–99)
GLUCOSE-CAPILLARY: 138 mg/dL — AB (ref 65–99)
Glucose-Capillary: 132 mg/dL — ABNORMAL HIGH (ref 65–99)
Glucose-Capillary: 132 mg/dL — ABNORMAL HIGH (ref 65–99)
Glucose-Capillary: 71 mg/dL (ref 65–99)
Glucose-Capillary: 237 mg/dL — ABNORMAL HIGH (ref 65–99)

## 2016-11-12 LAB — BASIC METABOLIC PANEL
Anion gap: 8 (ref 5–15)
BUN: 49 mg/dL — AB (ref 6–20)
CALCIUM: 7.9 mg/dL — AB (ref 8.9–10.3)
CO2: 36 mmol/L — ABNORMAL HIGH (ref 22–32)
CREATININE: 1.04 mg/dL (ref 0.61–1.24)
Chloride: 104 mmol/L (ref 101–111)
GFR calc Af Amer: >60 mL/min (ref >60)
GFR calc non Af Amer: >60 mL/min (ref >60)
Glucose, Bld: 151 mg/dL — ABNORMAL HIGH (ref 65–99)
POTASSIUM: 3.9 mmol/L (ref 3.5–5.1)
SODIUM: 148 mmol/L — AB (ref 135–145)

## 2016-11-12 LAB — PHOSPHORUS: Phosphorus: 3.8 mg/dL (ref 2.5–4.6)

## 2016-11-12 LAB — MAGNESIUM: MAGNESIUM: 2.1 mg/dL (ref 1.7–2.4)

## 2016-11-12 MED ORDER — FUROSEMIDE 10 MG/ML IJ SOLN
40.0000 mg | Freq: Two times a day (BID) | INTRAMUSCULAR | Status: DC
  Administered 2016-11-12 (×2): 40 mg via INTRAVENOUS
  Filled 2016-11-12 (×3): qty 4

## 2016-11-12 MED ORDER — DEXTROSE 50 % IV SOLN
INTRAVENOUS | Status: AC
  Administered 2016-11-12: 50 mL
  Filled 2016-11-12: qty 50

## 2016-11-12 MED ORDER — WARFARIN SODIUM 2 MG PO TABS
2.0000 mg | ORAL_TABLET | Freq: Once | ORAL | Status: AC
  Administered 2016-11-12: 2 mg via ORAL
  Filled 2016-11-12 (×2): qty 1

## 2016-11-12 MED ORDER — AMIODARONE HCL 200 MG PO TABS
200.0000 mg | ORAL_TABLET | Freq: Two times a day (BID) | ORAL | Status: DC
  Administered 2016-11-12 – 2016-11-18 (×13): 200 mg
  Filled 2016-11-12 (×13): qty 1

## 2016-11-12 MED ORDER — METOPROLOL TARTRATE 25 MG/10 ML ORAL SUSPENSION
12.5000 mg | Freq: Two times a day (BID) | ORAL | Status: DC
  Administered 2016-11-12 – 2016-11-15 (×7): 12.5 mg
  Filled 2016-11-12 (×7): qty 5

## 2016-11-12 MED ORDER — DEXTROSE 50 % IV SOLN: 25.0000 mL | Freq: Once | INTRAVENOUS | Status: AC

## 2016-11-12 NOTE — Progress Notes (Signed)
PCCM Progress Note  ADMISSION DATE:  10/20/2016 CONSULTATION DATE: 10/23/2016 REFERRING MD: Ricard Dillon  CHIEF COMPLAINT:  Chest pain  HISTORY OF PRESENT ILLNESS:   70 yo former smoker with known CAD and ascending aorta aneurysm, who was taken to OR 11/30 for cabg x 3, aortic root replacement, MVR replacement, thoracic cavity left open and had extended pump time in OR.  SUBJECTIVE:  No events overnight, tolerating TC well this AM.  VITAL SIGNS: BP 101/77   Pulse (!) 104   Temp 99 F (37.2 C)   Resp 15   Ht 5\' 9"  (1.753 m)   Wt 103.3 kg (227 lb 11.8 oz)   SpO2 100%   BMI 33.63 kg/m   HEMODYNAMICS: CVP:  [9 mmHg-14 mmHg] 13 mmHg  INTAKE / OUTPUT: I/O last 3 completed shifts: In: 7949 [I.V.:2979; Other:60; NG/GT:4330; IV Piggyback:580] Out: [id number]; Stool:725; Chest Tube:200]  PHYSICAL EXAMINATION: General: ill appearing male, NAD  Neuro: RASS 0 follow commands HEENT:  ETT in place, /AT, PERRL, EOM-I and MMM Cardiovascular: irregular, chest closed Lungs:  No wheeze Abdomen:  Soft, non tender Ext: 1+ edema, extremities cool Skin: Ischemic changes in fingers/toes  b/l  CMP Latest Ref Rng & Units 11/12/2016 11/11/2016 11/10/2016  Glucose 65 - 99 mg/dL 151(H) 222(H) 127(H)  BUN 6 - 20 mg/dL 49(H) 43(H) 44(H)  Creatinine 0.61 - 1.24 mg/dL 1.04 1.00 1.01  Sodium 135 - 145 mmol/L 148(H) 148(H) 151(H)  Potassium 3.5 - 5.1 mmol/L 3.9 3.4(L) 4.5  Chloride 101 - 111 mmol/L 104 107 112(H)  CO2 22 - 32 mmol/L 36(H) 35(H) 34(H)  Calcium 8.9 - 10.3 mg/dL 7.9(L) 7.8(L) 8.0(L)  Total Protein 6.5 - 8.1 g/dL - - -  Total Bilirubin 0.3 - 1.2 mg/dL - - -  Alkaline Phos 38 - 126 U/L - - -  AST 15 - 41 U/L - - -  ALT 17 - 63 U/L - - -    CBC Latest Ref Rng & Units 11/12/2016 11/11/2016 11/10/2016  WBC 4.0 - 10.5 K/uL 9.8 9.4 9.1  Hemoglobin 13.0 - 17.0 g/dL 9.7(L) 9.6(L) 10.4(L)  Hematocrit 39.0 - 52.0 % 34.1(L) 33.8(L) 36.5(L)  Platelets 150 - 400 K/uL 216 233 278     ABG    Component Value Date/Time   PHART 7.437 11/10/2016 0340   PCO2ART 45.6 11/10/2016 0340   PO2ART 100 11/10/2016 0340   HCO3 29.9 (H) 11/10/2016 0340   TCO2 36 11/04/2016 1950   ACIDBASEDEF 1.0 10/27/2016 1125   O2SAT 97.3 11/10/2016 0340    CBG (last 3)   Recent Labs  11/11/16 2348 11/12/16 0448 11/12/16 0739  GLUCAP 186* 132* 131*    Imaging: No results found.  STUDIES:  12.5 dopplers lowers>>>neg 12/6 dopplers arms >>>neg 12/7 abdo us>>>Cholelithiasis without sonographic evidence for acute cholecystitis. No biliary dilatation.Increased hepatic echogenicity, suggestive of steatosis. Ascites. 12/7 echo>>>60-65%, rv poor images but appeared wnl  CULTURES: C diff 12/12 >> positive Blood 12/12 >> Rt thigh wound 12/12 >> neg   ANTIBIOTICS:  Acyclovir 12/10 >> PO vancomycin 12/12 >> Flagyl 12/13 >>  SIGNIFICANT EVENTS: 11/30 surgery, hypoxia 12/4 closed chest 12/7 high fevers, LFt up  LINES/TUBES: 11/30 ETT>>12/18 Trach (CVTS) 12/18>>> 12/04 picc left >>> 11/30 CT x 4>> R CT x 2 12/16>>>  ASSESSMENT / PLAN:  Acute hypoxic respiratory failure after CABG with MVR. - TC while awake then full support at night tonight. - Titrate O2 for sat of 88-92% - No need for further CXR and  ABGs unless problematic  CAD s/p CABG, MVR, aortic root replacement. A fib. Rt atrial clot. - PO amiodarone, ASA - D/C Lasix drip - Heparin gtt per pharmacy.  B/l hand/feet ischemia. - Will need further therapy when more stable  C diff colitis. - Enteral vancomycin per ID complete - Acyclovir IV  Anemia of critical illness. - F/u CBC  DM. - SSI with lantus  Acute metabolic encephalopathy. - RASS goal 0 to -1 - Continue precedex - Fentanyl drip for pain control  DVT prophylaxis - heparin gtt SUP - Pepcid Nutrition - tubes feeds Goals of care - Full code  Resolved problems >> DIC  No family bedside to updated.  Maintain on TC as tolerated.  The  patient is critically ill with multiple organ systems failure and requires high complexity decision making for assessment and support, frequent evaluation and titration of therapies, application of advanced monitoring technologies and extensive interpretation of multiple databases.   Critical Care Time devoted to patient care services described in this note is  35  Minutes. This time reflects time of care of this signee Dr Jennet Maduro. This critical care time does not reflect procedure time, or teaching time or supervisory time of PA/NP/Med student/Med Resident etc but could involve care discussion time.  Rush Farmer, M.D. [facility] Pulmonary/Critical Care Medicine. Pager: [phone]. After hours pager: [phone].

## 2016-11-12 NOTE — Consult Note (Signed)
[place] Nurse wound consult note Reason for Consult: bilateral LE blisters Patient has been seen by Dr. Sharol Given for ischemic LE and hands. Today bedside nursing staff concerned that LE blisters are rupturing.   Wound type: ischemic changes with blistering bilateral ankles and feet.  Wound bed: multiple areas over the inner ankles and feet with partial thickness skin loss related to previous blistering, all are clean, pink and moist. Toes, heels and plantar foot surfaces are purple and reddish in color. Drainage (amount, consistency, odor) serous, no odor Periwound: edema, nurse doppler pulses while WOC nurse completing assessment of the wounds Dressing procedure/placement/frequency: Xeroform gauze non adherent for open areas and intact blisters bilateral LEs. Cover with ABD pad and kerlix. Continue Prevalon boots for offloading the heels.   Change dressings every other day.   Discussed POC with patient and bedside nurse.  Re consult if needed, will not follow at this time. Thanks  Ranen Doolin [employer], RN,CWOCN, CNS [phone])

## 2016-11-12 NOTE — Progress Notes (Signed)
Patient ID: Scott [name], male   DOB: 1946/06/25, 70 y.o.   MRN: [id number]     Advanced Heart Failure Rounding Note  Referring Physician: Dr Roxy Manns Primary Physician: Dr Quintin Alto Primary Cardiologist:  Dr Percival Spanish   Reason for Consultation: Heart Failure   Subjective:    Admitted with chest pain. RHC/LHC with 3 vessel disease. Taken to the OR 11/30 for cabg x 3, repair of thoracic ascending aneurysm, AVR bioprosthetic,  MVR, and open chest with VAC placement.    Chest successfully closed 10/26/16.   S/p tracheostomy 11/09/16.   Off inotropes. Excellent diuresis overnight on lasix gtt. Now switched to lasix 40 IV bid.  Weights trending down. CVP 12   Awake and alert. Denies pain ( shakes head)  Warfarin has been started, off heparin with INR > 2.   ID following. Remains on oral vanc and IV flagyl currently for c.diff. Now afebrile.    Studies:  TEE 10/26/16 EF 40-45%, small cavity size, flattened/hypokinetic septum, s/p MV repair with mild MR, mild to moderately decreased RV systolic function. + RA thrombus.   Limited echo 10/29/16 LVEF 60-65%, unable to comment on AVR structure or function with poor windows.   Upper and lower extremity doppler studies: No DVTs  ABIs (12/8): Unable to doppler PTs, or get TBIs.   Objective:   Weight Range: 227 lb 11.8 oz (103.3 kg) Body mass index is 33.63 kg/m.   Vital Signs:   Temp:  [96.4 F (35.8 C)-99.9 F (37.7 C)] 99.3 F (37.4 C) (12/21 1100) Pulse Rate:  [51-109] 106 (12/21 1100) Resp:  [0-26] 14 (12/21 1100) BP: (72-146)/(49-99) 94/66 (12/21 1100) SpO2:  [100 %] 100 % (12/21 1100) FiO2 (%):  [40 %] 40 % (12/21 0900) Weight:  [227 lb 11.8 oz (103.3 kg)] 227 lb 11.8 oz (103.3 kg) (12/21 0500) Last BM Date: 11/12/16  Weight change: Filed Weights   11/10/16 0335 11/11/16 0600 11/12/16 0500  Weight: 246 lb 7.6 oz (111.8 kg) 235 lb 0.2 oz (106.6 kg) 227 lb 11.8 oz (103.3 kg)    Intake/Output:   Intake/Output Summary (Last  24 hours) at 11/12/16 1120 Last data filed at 11/12/16 1100  Gross per 24 hour  Intake           4581.7 ml  Output             6075 ml  Net          -1493.3 ml     Physical Exam: CVP 12-13 General: s/p Tracheostomy. NAD.  HEENT: + Trach Neck: supple. JVP jaw. Carotids 2+ bilat; no bruits. No thyromegaly or nodule noted.    Cor: PMI nondisplaced. Mildly tachy, regular.  No M/G/R noted. Lungs: + rhonchi.    Abdomen: soft, NT, ND, no HSM. No bruits or masses. +BS  Extremities: no clubbing, rash. 2+ edema 1/2 way to knees. Gangrenous changes to fingers and toes. Unable to doppler pedal pulses.  Neuro: Trachestomy in place. Awake/alert.  GU: Foley in place  Telemetry: Reviewed, sinus tachy ~ 100 bpm    Labs: CBC  Recent Labs  11/11/16 0400 11/12/16 0500  WBC 9.4 9.8  HGB 9.6* 9.7*  HCT 33.8* 34.1*  MCV 98.5 98.6  PLT 233 564   Basic Metabolic Panel  Recent Labs  11/11/16 0400 11/12/16 0500  NA 148* 148*  K 3.4* 3.9  CL 107 104  CO2 35* 36*  GLUCOSE 222* 151*  BUN 43* 49*  CREATININE 1.00 1.04  CALCIUM 7.8*  7.9*  MG 1.9 2.1  PHOS 3.6 3.8   Liver Function Tests No results for input(s): AST, ALT, ALKPHOS, BILITOT, PROT, ALBUMIN in the last 72 hours. No results for input(s): LIPASE, AMYLASE in the last 72 hours. Cardiac Enzymes No results for input(s): CKTOTAL, CKMB, CKMBINDEX, TROPONINI in the last 72 hours.  BNP: BNP (last 3 results) No results for input(s): BNP in the last 8760 hours.  ProBNP (last 3 results) No results for input(s): PROBNP in the last 8760 hours.   D-Dimer No results for input(s): DDIMER in the last 72 hours. Hemoglobin A1C No results for input(s): HGBA1C in the last 72 hours. Fasting Lipid Panel No results for input(s): CHOL, HDL, LDLCALC, TRIG, CHOLHDL, LDLDIRECT in the last 72 hours. Thyroid Function Tests No results for input(s): TSH, T4TOTAL, T3FREE, THYROIDAB in the last 72 hours.  Invalid input(s): FREET3  Other  results:  Imaging/Studies:  No results found.  Medications:     Scheduled Medications: . acyclovir  200 mg Oral QID  . amiodarone  200 mg Per Tube BID  . aspirin EC  325 mg Oral Daily   Or  . aspirin  324 mg Per Tube Daily  . atorvastatin  40 mg Per Tube q1800  . chlorhexidine gluconate (MEDLINE KIT)  15 mL Mouth Rinse BID  . famotidine  20 mg Per Tube Q12H  . feeding supplement (PRO-STAT SUGAR FREE 64)  30 mL Per Tube BID  . free water  400 mL Per Tube Q6H  . furosemide  40 mg Intravenous Q12H  . insulin aspart  0-24 Units Subcutaneous Q4H  . insulin glargine  30 Units Subcutaneous BID  . mouth rinse  15 mL Mouth Rinse QID  . metoprolol tartrate  12.5 mg Per Tube BID  . potassium chloride  40 mEq Per Tube Q6H  . sodium chloride flush  3 mL Intravenous Q12H  . vancomycin  500 mg Per Tube Q6H  . Warfarin - Pharmacist Dosing Inpatient   Does not apply q1800    Infusions: . sodium chloride 10 mL/hr at 11/12/16 1100  . dexmedetomidine 0.7 mcg/kg/hr (11/12/16 1100)  . feeding supplement (VITAL HIGH PROTEIN) 1,000 mL (11/12/16 1100)  . fentaNYL infusion INTRAVENOUS 200 mcg/hr (11/12/16 1100)    PRN Medications: acetaminophen (TYLENOL) oral liquid 160 mg/5 mL, Gerhardt's butt cream, ipratropium-albuterol, metoprolol, midazolam, morphine injection, ondansetron (ZOFRAN) IV, phenol, sodium chloride flush   Assessment/Plan   Mr Castorena is 70 year old admitted with CP. Complex operation this admission.  CABG x 3 + bioprosthetic AVR for severe bicuspid AS + ascending aorta replacement + MV repair for flail P2.  MV repair complicated by severe SAM, ended up having to remove posterior annuloplasty band.  Chest closed on 12/3.   1. CAD:  10/22/16 S/P CABG x3 with chest left open.  Chest successfully closed am of 10/26/16. - Continue ASA - Started atorvastatin 40 daily with LFTs back to normal range when last checked.  2. Severe aortic stenosis: 11/302017 S/P AVR bioprostheic  3.  Mitral regurgitation: Partial flail leaflet with MV repair.  Developed SAM initially after repair and posterior annuloplasty ring had to be removed.  SAM resolved. 4. Acute on chronic systolic CHF:  TEE at time of chest closure showed EF 40-45%, small cavity size, flattened/hypokinetic septum, s/p MV repair with mild MR, mild to moderately decreased RV systolic function, thrombus in RA. Repeat echo 12/7 with EF 60-65%.  Weaned off off milrinone and phenylephrine.  - CVP remains 12-13.  Weight above baseline but coming down.  - Lasix drip changed to lasix 40 iv bid by Dr. Roxy Manns. Will see how he responds 5. ID: ?Source of fever - Concerned digital gangrene is a contributor/etiology. - Currently on po vanc and IV flagyl for C difficile.   - ID following.  6. Elevated LFTs: Likely shock liver.   - LFTs stable. Statin back on.     7. Heme: HIT negative, suspect DIC. Platelet count has recovered.  - warfarin for RA thrombus and atrial fibrillation => INR > 2, off heparin now.   8. VDRF: CCM following, S/p tracheostomy am of 11/09/16.  9. Atrial fibrillation/flutter: Currently sinus tachycardia .   - Continue couamdin and po amiodarone.  10. PAD: Finger gangrene, mottled toes.  Unable to doppler PT pulses or get TBIs. - Possibly was source of his fever.  - Vascular has seen. Will need amputation of most digits at PIP joints.   Kahil Agner, PA-C 11/12/2016 11:20 AM   Advanced Heart Failure Team Pager [phone] (M-F; 7a - 4p)  Please contact [facility] for night-coverage after hours (4p -7a ) and weekends on [email or web address]   Patient seen and examined with Oda Kilts, PA-C. We discussed all aspects of the encounter. I agree with the assessment and plan as stated above.   He is progressing slowly. Excellent diuresis overnight on lasix gtt. No on lasix 40 iv bid. Will follow response. Maintaining NSR. Continue amio. Stable on trach. Will attempt to wean slowly. Attempt to mobilize as  tolerated. He will likely need [facility].   Bensimhon, Daniel,MD 8:44 PM

## 2016-11-12 NOTE — Progress Notes (Signed)
Hypoglycemic Event  CBG: 71 (Pt. Sweaty and states he feels bad)  Treatment:  25 ml D50   Symptoms: sweaty, states he feels bad  Follow-up CBG: Time:1745 CBG Result: 143  Possible Reasons for Event:  Increased lantus dosing  Comments/MD notified: Dr. Paralee Cancel, Arville Lime

## 2016-11-12 NOTE — Progress Notes (Addendum)
Hypoglycemic Event  CBG: 68  Treatment: 25 ml D50 IV  Symptoms: None  Follow-up CBG: [id number] CBG Result:237  Possible Reasons for Event: Lantus dose increased to 30 units  Comments/MD notified: MD will be notified in AM.    Levon Hedger

## 2016-11-12 NOTE — Progress Notes (Signed)
Patient ID: Scott [name], male   DOB: 05-25-1946, 69 y.o.   MRN: [id number] EVENING ROUNDS NOTE :     [address]       ,[place] [address]             [phone]                 3 Days Post-Op Procedure(s) (LRB): TRACHEOSTOMY (N/A) VIDEO BRONCHOSCOPY (N/A)  Total Length of Stay:  LOS: 23 days  BP 93/63   Pulse (!) 106   Temp 99.5 F (37.5 C)   Resp 13   Ht 5\' 9"  (1.753 m)   Wt 227 lb 11.8 oz (103.3 kg)   SpO2 100%   BMI 33.63 kg/m   .Intake/Output      12/20 0701 - 12/21 0700 12/21 0701 - 12/22 0700   I.V. (mL/kg) 1759.2 (17) 561.1 (5.4)   Other 60    NG/GT 2720 1125   IV Piggyback 315    Total Intake(mL/kg) 4854.2 (47) 1686.1 (16.3)   Urine (mL/kg/hr) 6075 (2.5) 2400 (2)   Emesis/NG output     Stool 425 (0.2) 125 (0.1)   Chest Tube 150 (0.1) 30 (0)   Total Output 6650 2555   Net -1795.8 -868.9          . sodium chloride 10 mL/hr at 11/12/16 1800  . dexmedetomidine 0.5 mcg/kg/hr (11/12/16 1800)  . feeding supplement (VITAL HIGH PROTEIN) 1,000 mL (11/12/16 1800)  . fentaNYL infusion INTRAVENOUS 150 mcg/hr (11/12/16 1800)     Lab Results  Component Value Date   WBC 9.8 11/12/2016   HGB 9.7 (L) 11/12/2016   HCT 34.1 (L) 11/12/2016   PLT 216 11/12/2016   GLUCOSE 151 (H) 11/12/2016   CHOL 134 10/21/2016   TRIG 203 (H) 10/21/2016   HDL 28 (L) 10/21/2016   LDLCALC 65 10/21/2016   ALT 27 11/09/2016   AST 22 11/09/2016   NA 148 (H) 11/12/2016   K 3.9 11/12/2016   CL 104 11/12/2016   CREATININE 1.04 11/12/2016   BUN 49 (H) 11/12/2016   CO2 36 (H) 11/12/2016   TSH 1.93 10/19/2016   INR 1.81 11/12/2016   HGBA1C 6.6 (H) 10/21/2016   More alert , tolerating some weaning  Today    [name] MD  Beeper [phone] Office [phone] 11/12/2016 6:47 PM

## 2016-11-12 NOTE — Progress Notes (Signed)
ANTICOAGULATION CONSULT NOTE   Pharmacy Consult for Coumadin Indication: RA clot and afib   Allergies  Allergen Reactions  . Doxycycline Hives  . Penicillins Hives     Has patient had a PCN reaction causing immediate rash, facial/tongue/throat swelling, SOB or lightheadedness with hypotension: No Has patient had a PCN reaction causing severe rash involving mucus membranes or skin necrosis: No Has patient had a PCN reaction that required hospitalization: No Has patient had a PCN reaction occurring within the last 10 years:# # # YES # # #  If all of the above answers are "NO", then may proceed with Cephalosporin use.   . Sulfa Antibiotics Hives    Patient Measurements: Height: 5\' 9"  (175.3 cm) Weight: 227 lb 11.8 oz (103.3 kg) IBW/kg (Calculated) : 70.7  Heparin dosing wt: 97 kg  Vital Signs: Temp: 99.3 F (37.4 C) (12/21 1200) Temp Source: Core (Comment) (12/21 0400) BP: 107/65 (12/21 1200) Pulse Rate: 106 (12/21 1200)  Labs:  Recent Labs  11/10/16 0212 11/10/16 1212 11/11/16 0352 11/11/16 0400 11/12/16 0500  HGB 10.4*  --   --  9.6* 9.7*  HCT 36.5*  --   --  33.8* 34.1*  PLT 278  --   --  233 216  LABPROT  --   --   --  28.4* 21.2*  INR  --   --   --  2.61 1.81  HEPARINUNFRC 0.23* 0.54 0.74*  --   --   CREATININE 1.01  --   --  1.00 1.04    Estimated Creatinine Clearance: 78.2 mL/min (by C-G formula based on SCr of 1.04 mg/dL).  Assessment: Scott [name] on heparin for possible right atrial clot and afib. HIT ruled out (low heparin antibody and SRA negative).  Heparin level above goal this morning, INR 2.6 so will stop heparin now. CBC stable overall with no bleeding issues noted.   INR is variable, had coumadin 12/19 then held 12/20 with INR of 2.6 (no true baseline). INR now down to 1.8 so will give small dose tonight. No overt bleeding noted.  Moderate CT output of 150 overnight.   Amio reduced to 200 bid. Receiving TFs via ngt   Goal of Therapy: . INR goal  2-3 Monitor platelets by anticoagulation protocol: Yes   Plan:  Warfarin 2mg  tonight Continue Daily INR checks  Erin Hearing PharmD., BCPS Clinical Pharmacist Pager [phone] 11/12/2016 12:39 PM

## 2016-11-12 NOTE — Progress Notes (Signed)
11/12/2016 1600 RN asked patient if he would like to retry trach collar this evening. Pt. Nodding his head in decline. States he is too tired at this time. Will continue to closely monitor patient. Annaleise Burger, Arville Lime

## 2016-11-12 NOTE — Progress Notes (Signed)
11/12/2016 1015 Notified by pt. That he is tired and would like to be placed back on ventilator. RT notified. Will continue to closely monitor patient.  Mikalah Skyles, Arville Lime

## 2016-11-12 NOTE — Progress Notes (Signed)
      [address]       [place] [address]             [phone]        CARDIOTHORACIC SURGERY PROGRESS NOTE   R3 Days Post-Op Procedure(s) (LRB): TRACHEOSTOMY (N/A) VIDEO BRONCHOSCOPY (N/A)  Subjective: Tolerated TC trial for 8 hours yesterday but tired by the end of the day.  Reportedly rested well overnight  Objective: Vital signs: BP Readings from Last 1 Encounters:  11/12/16 (!) 92/49   Pulse Readings from Last 1 Encounters:  11/12/16 (!) 106   Resp Readings from Last 1 Encounters:  11/12/16 15   Temp Readings from Last 1 Encounters:  11/12/16 99 F (37.2 C)    Hemodynamics: CVP:  [9 mmHg-14 mmHg] 13 mmHg  Physical Exam:  Rhythm:   Sinus tach  Breath sounds: Coarse but clear  Heart sounds:  RRR  Incisions:  Clean and dry  Abdomen:  Soft, non-distended, non-tender  Extremities:  Warm, well-perfused, feet mottled but improved, necrotic fingers  Chest tubes:  decreasing volume thin serosanguinous output, no air leak    Intake/Output from previous day: 12/20 0701 - 12/21 0700 In: 4854.2 [I.V.:1759.2; NG/GT:2720; IV Piggyback:315] Out: 6650 [Urine:6075; Stool:425; Chest Tube:150] Intake/Output this shift: No intake/output data recorded.  Lab Results:  CBC: Recent Labs  11/11/16 0400 11/12/16 0500  WBC 9.4 9.8  HGB 9.6* 9.7*  HCT 33.8* 34.1*  PLT 233 216    BMET:  Recent Labs  11/11/16 0400 11/12/16 0500  NA 148* 148*  K 3.4* 3.9  CL 107 104  CO2 35* 36*  GLUCOSE 222* 151*  BUN 43* 49*  CREATININE 1.00 1.04  CALCIUM 7.8* 7.9*     PT/INR:   Recent Labs  11/12/16 0500  LABPROT 21.2*  INR 1.81    CBG (last 3)   Recent Labs  11/11/16 2348 11/12/16 0448 11/12/16 0739  GLUCAP 186* 132* 131*    ABG    Component Value Date/Time   PHART 7.437 11/10/2016 0340   PCO2ART 45.6 11/10/2016 0340   PO2ART 100 11/10/2016 0340   HCO3 29.9 (H) 11/10/2016 0340   TCO2 36 11/04/2016 1950   ACIDBASEDEF 1.0  10/27/2016 1125   O2SAT 97.3 11/10/2016 0340    CXR: n/a  Assessment/Plan: S/P Procedure(s) (LRB): TRACHEOSTOMY (N/A) VIDEO BRONCHOSCOPY (N/A)  Clinically stable, slowly improving.  Diuresing well last 4 days and weight down.  Tolerating TC trials fairly well Will decrease amiodarone dose and add low dose beta blocker Stop lasix drip and begin intermittent dosing Continue added free water and tube feeds Continue coumadin per pharmacy - probably need a dose today Continue vancomycin per tube for 14 days - Flagyl has been stopped per I.D.  Will need to recheck C diff after off Vanc Leave chest tubes in until output decreases further - hopefully can d/c soon Continue to monitor appearance of hands and feet - Dr. Sharol Given involved  Rexene Alberts, MD 11/12/2016 8:19 AM

## 2016-11-13 ENCOUNTER — Inpatient Hospital Stay (HOSPITAL_COMMUNITY): Payer: Medicare Other

## 2016-11-13 LAB — GLUCOSE, CAPILLARY
GLUCOSE-CAPILLARY: 160 mg/dL — AB (ref 65–99)
GLUCOSE-CAPILLARY: 91 mg/dL (ref 65–99)
GLUCOSE-CAPILLARY: 89 mg/dL (ref 65–99)
GLUCOSE-CAPILLARY: 154 mg/dL — AB (ref 65–99)
GLUCOSE-CAPILLARY: 133 mg/dL — AB (ref 65–99)
Glucose-Capillary: 111 mg/dL — ABNORMAL HIGH (ref 65–99)
Glucose-Capillary: 231 mg/dL — ABNORMAL HIGH (ref 65–99)
Glucose-Capillary: 427 mg/dL — ABNORMAL HIGH (ref 65–99)
Glucose-Capillary: 92 mg/dL (ref 65–99)

## 2016-11-13 LAB — BASIC METABOLIC PANEL
ANION GAP: 6 (ref 5–15)
BUN: 38 mg/dL — ABNORMAL HIGH (ref 6–20)
CALCIUM: 8.1 mg/dL — AB (ref 8.9–10.3)
CHLORIDE: 108 mmol/L (ref 101–111)
CO2: 36 mmol/L — AB (ref 22–32)
CREATININE: 0.87 mg/dL (ref 0.61–1.24)
GFR calc non Af Amer: >60 mL/min (ref >60)
Glucose, Bld: 134 mg/dL — ABNORMAL HIGH (ref 65–99)
Potassium: 3.8 mmol/L (ref 3.5–5.1)
SODIUM: 150 mmol/L — AB (ref 135–145)

## 2016-11-13 LAB — PHOSPHORUS: PHOSPHORUS: 3.3 mg/dL (ref 2.5–4.6)

## 2016-11-13 LAB — CBC
HCT: 32.7 % — ABNORMAL LOW (ref 39.0–52.0)
HEMOGLOBIN: 9.3 g/dL — AB (ref 13.0–17.0)
MCH: 28.6 pg (ref 26.0–34.0)
MCHC: 28.4 g/dL — ABNORMAL LOW (ref 30.0–36.0)
MCV: 100.6 fL — AB (ref 78.0–100.0)
Platelets: 164 10*3/uL (ref 150–400)
RBC: 3.25 MIL/uL — AB (ref 4.22–5.81)
RDW: 18.9 % — ABNORMAL HIGH (ref 11.5–15.5)
WBC: 9.4 10*3/uL (ref 4.0–10.5)

## 2016-11-13 LAB — MAGNESIUM: MAGNESIUM: 2.1 mg/dL (ref 1.7–2.4)

## 2016-11-13 LAB — PROTIME-INR
INR: 1.82
Prothrombin Time: 21.3 seconds — ABNORMAL HIGH (ref 11.4–15.2)

## 2016-11-13 MED ORDER — FENTANYL 25 MCG/HR TD PT72
50.0000 ug | MEDICATED_PATCH | TRANSDERMAL | Status: DC
  Administered 2016-11-13 – 2016-11-16 (×2): 50 ug via TRANSDERMAL
  Filled 2016-11-13 (×2): qty 2

## 2016-11-13 MED ORDER — FUROSEMIDE 10 MG/ML IJ SOLN
20.0000 mg | Freq: Two times a day (BID) | INTRAMUSCULAR | Status: DC
  Administered 2016-11-13: 20 mg via INTRAVENOUS
  Filled 2016-11-13 (×2): qty 2

## 2016-11-13 MED ORDER — WARFARIN SODIUM 2 MG PO TABS
2.0000 mg | ORAL_TABLET | Freq: Once | ORAL | Status: AC
  Administered 2016-11-13: 2 mg via ORAL
  Filled 2016-11-13: qty 1

## 2016-11-13 MED ORDER — ROPINIROLE HCL 1 MG PO TABS
2.0000 mg | ORAL_TABLET | Freq: Two times a day (BID) | ORAL | Status: DC
  Administered 2016-11-13 – 2016-11-17 (×9): 2 mg
  Filled 2016-11-13 (×11): qty 2

## 2016-11-13 MED ORDER — ALLOPURINOL 300 MG PO TABS
300.0000 mg | ORAL_TABLET | Freq: Every day | ORAL | Status: DC
  Administered 2016-11-14 – 2016-11-18 (×5): 300 mg
  Filled 2016-11-13 (×5): qty 1

## 2016-11-13 MED ORDER — SODIUM CHLORIDE 0.9 % IV SOLN
INTRAVENOUS | Status: DC
  Administered 2016-11-13: 0.3 [IU]/h via INTRAVENOUS
  Filled 2016-11-13: qty 2.5

## 2016-11-13 NOTE — Progress Notes (Signed)
ANTICOAGULATION CONSULT NOTE   Pharmacy Consult for Coumadin Indication: RA clot and afib   Allergies  Allergen Reactions  . Doxycycline Hives  . Penicillins Hives     Has patient had a PCN reaction causing immediate rash, facial/tongue/throat swelling, SOB or lightheadedness with hypotension: No Has patient had a PCN reaction causing severe rash involving mucus membranes or skin necrosis: No Has patient had a PCN reaction that required hospitalization: No Has patient had a PCN reaction occurring within the last 10 years:# # # YES # # #  If all of the above answers are "NO", then may proceed with Cephalosporin use.   . Sulfa Antibiotics Hives    Patient Measurements: Height: 5\' 9"  (175.3 cm) Weight: 226 lb 6.6 oz (102.7 kg) IBW/kg (Calculated) : 70.7  Heparin dosing wt: 97 kg  Vital Signs: Temp: 98.4 F (36.9 C) (12/22 0801) Temp Source: Core (Comment) (12/22 0600) BP: 92/62 (12/22 0801) Pulse Rate: 105 (12/22 0801)  Labs:  Recent Labs  11/10/16 1212 11/11/16 0352  11/11/16 0400 11/12/16 0500 11/13/16 0415  HGB  --   --   < > 9.6* 9.7* 9.3*  HCT  --   --   --  33.8* 34.1* 32.7*  PLT  --   --   --  233 216 164  LABPROT  --   --   --  28.4* 21.2* 21.3*  INR  --   --   --  2.61 1.81 1.82  HEPARINUNFRC 0.54 0.74*  --   --   --   --   CREATININE  --   --   --  1.00 1.04 0.87  < > = values in this interval not displayed.  Estimated Creatinine Clearance: 93.3 mL/min (by C-G formula based on SCr of 0.87 mg/dL).  Assessment: 70yom on heparin for possible right atrial clot and afib. HIT ruled out (low heparin antibody and SRA negative).  INR is variable, but appears to be stabilizing. Received coumadin 12/19 then held 12/20 with INR of 2.6 (no true baseline). INR now down to 1.8 x 2 days after 2mg  given last night will repeat tonight. No overt bleeding noted.   If INR unchanged in am will start to titrate up dose.   Amio reduced to 200 bid. Receiving TFs via ngt    Goal of Therapy: . INR goal 2-3 Monitor platelets by anticoagulation protocol: Yes   Plan:  Warfarin 2mg  tonight Continue Daily INR checks  Erin Hearing PharmD., BCPS Clinical Pharmacist Pager [phone] 11/13/2016 10:49 AM

## 2016-11-13 NOTE — Progress Notes (Signed)
[address]       [place] [address]             [phone]        CARDIOTHORACIC SURGERY PROGRESS NOTE   R4 Days Post-Op Procedure(s) (LRB): TRACHEOSTOMY (N/A) VIDEO BRONCHOSCOPY (N/A)  Subjective: Awake and alert on T-collar.  Comfortable  Objective: Vital signs: BP Readings from Last 1 Encounters:  11/13/16 101/80   Pulse Readings from Last 1 Encounters:  11/13/16 (!) 106   Resp Readings from Last 1 Encounters:  11/13/16 10   Temp Readings from Last 1 Encounters:  11/13/16 98.6 F (37 C)    Hemodynamics: CVP:  [12 mmHg-13 mmHg] 12 mmHg  Physical Exam:  Rhythm:   sinus  Breath sounds: Coarse but clear  Heart sounds:  RRR  Incisions:  Clean and dry, demarcated eschar right thigh incision  Abdomen:  Soft, non-distended, non-tender  Extremities:  Warm, adequately perfused, no significant change in appearance of fingers and toes  Chest tubes:  Very low volume thin serosanguinous output, no air leak    Intake/Output from previous day: 12/21 0701 - 12/22 0700 In: 3582.2 [I.V.:1082.2; NG/GT:2500] Out: 5485 [Urine:5100; Stool:325; Chest Tube:60] Intake/Output this shift: Total I/O In: 195 [I.V.:30; NG/GT:165] Out: 700 [Urine:700]  Lab Results:  CBC: Recent Labs  11/12/16 0500 11/13/16 0415  WBC 9.8 9.4  HGB 9.7* 9.3*  HCT 34.1* 32.7*  PLT 216 164    BMET:  Recent Labs  11/12/16 0500 11/13/16 0415  NA 148* 150*  K 3.9 3.8  CL 104 108  CO2 36* 36*  GLUCOSE 151* 134*  BUN 49* 38*  CREATININE 1.04 0.87  CALCIUM 7.9* 8.1*     PT/INR:   Recent Labs  11/13/16 0415  LABPROT 21.3*  INR 1.82    CBG (last 3)   Recent Labs  11/13/16 0325 11/13/16 0910 11/13/16 1141  GLUCAP 111* 160* 231*    ABG    Component Value Date/Time   PHART 7.437 11/10/2016 0340   PCO2ART 45.6 11/10/2016 0340   PO2ART 100 11/10/2016 0340   HCO3 29.9 (H) 11/10/2016 0340   TCO2 36 11/04/2016 1950   ACIDBASEDEF 1.0 10/27/2016 1125     O2SAT 97.3 11/10/2016 0340    CXR: PORTABLE CHEST 1 VIEW  COMPARISON:  November 10, 2016  FINDINGS: Tracheostomy catheter tip is 7.2 cm above the carina. Feeding tube tip is below the diaphragm. There are chest tubes bilaterally. No pneumothorax. There is patchy atelectasis in lung bases. There is mild interstitial edema. There is no airspace consolidation. There is cardiomegaly with pulmonary vascular within normal limits. Atherosclerotic calcification is noted in the aorta.  IMPRESSION: Tube positions as described without pneumothorax. Cardiomegaly with mild interstitial edema suggesting a degree of congestive heart failure. Mild bibasilar atelectasis. No airspace consolidation. There is aortic atherosclerosis. Cardiac silhouette is stable compared to recent prior study.   Electronically Signed   By: Lowella Grip III M.D.   On: 11/13/2016 07:19   Assessment/Plan: S/P Procedure(s) (LRB): TRACHEOSTOMY (N/A) VIDEO BRONCHOSCOPY (N/A)  Clinically stable, slowly improving.  Diuresing well last 5 days and weight down.  Tolerating TC trials fairly well Will decrease lasix dose further and continue added free water and tube feeds Continue coumadin per pharmacy - probably needs another dose today Continue vancomycin per tube for 14 days - Flagyl has been stopped per I.D.  Will need to recheck C diff after off Vanc D/C chest tubes Continue to monitor  appearance of hands and feet - Dr. Sharol Given involved  Rexene Alberts, MD 11/13/2016 2:24 PM

## 2016-11-13 NOTE — Progress Notes (Signed)
Inpatient Diabetes Program Recommendations  AACE/ADA: New Consensus Statement on Inpatient Glycemic Control (2015)  Target Ranges:  Prepandial:   less than 140 mg/dL      Peak postprandial:   less than 180 mg/dL (1-2 hours)      Critically ill patients:  140 - 180 mg/dL   Lab Results  Component Value Date   GLUCAP 160 (H) 11/13/2016   HGBA1C 6.6 (H) 10/21/2016    Review of Glycemic Control:  Results for Scott [name], Scott [name] (MRN [id number]) as of 11/13/2016 10:58  Ref. Range 11/12/2016 20:05 11/12/2016 20:31 11/12/2016 23:13 11/13/2016 03:25 11/13/2016 09:10  Glucose-Capillary Latest Ref Range: 65 - 99 mg/dL 68 237 (H) 138 (H) 111 (H) 160 (H)   Inpatient Diabetes Program Recommendations:    Consider reducing Lantus to 15 units bid.    Thanks Adah Perl, RN, BC-ADM Inpatient Diabetes Coordinator Pager [phone] (8a-5p)

## 2016-11-13 NOTE — Plan of Care (Signed)
Problem: Coping: Goal: Ability to adjust to condition or change in health will improve Outcome: Progressing Patient has better spirits today about their condition. Patient is more interactive and helpful when RN is in patient's room.

## 2016-11-13 NOTE — Progress Notes (Signed)
Patient ID: Scott [name], male   DOB: June 23, 1946, 70 y.o.   MRN: [id number]     Advanced Heart Failure Rounding Note  Referring Physician: Dr Roxy Manns Primary Physician: Dr Quintin Alto Primary Cardiologist:  Dr Percival Spanish   Reason for Consultation: Heart Failure   Subjective:    Admitted with chest pain. RHC/LHC with 3 vessel disease. Taken to the OR 11/30 for cabg x 3, repair of thoracic ascending aneurysm, AVR bioprosthetic,  MVR, and open chest with VAC placement.    Chest successfully closed 10/26/16.   S/p tracheostomy 11/09/16.   Off inotropes. On trach. Awake. Remains on lasix 40 IV bid. Na climbing now 150 getting free H2O Weight down another pound. CVP 11.  Denies pain ( shakes head)  On warfarin INR 1.8 .   ID following. Remains on oral vanc and IV flagyl currently for c.diff. Now afebrile.    Studies:  TEE 10/26/16 EF 40-45%, small cavity size, flattened/hypokinetic septum, s/p MV repair with mild Scott, mild to moderately decreased RV systolic function. + RA thrombus.   Limited echo 10/29/16 LVEF 60-65%, unable to comment on AVR structure or function with poor windows.   Upper and lower extremity doppler studies: No DVTs  ABIs (12/8): Unable to doppler PTs, or get TBIs.   Objective:   Weight Range: 102.7 kg (226 lb 6.6 oz) Body mass index is 33.44 kg/m.   Vital Signs:   Temp:  [98.2 F (36.8 C)-100.4 F (38 C)] 98.2 F (36.8 C) (12/22 1900) Pulse Rate:  [103-110] 109 (12/22 1900) Resp:  [0-22] 17 (12/22 1900) BP: (91-127)/(62-82) 117/77 (12/22 1900) SpO2:  [100 %] 100 % (12/22 1900) FiO2 (%):  [40 %] 40 % (12/22 1900) Weight:  [102.7 kg (226 lb 6.6 oz)] 102.7 kg (226 lb 6.6 oz) (12/22 0415) Last BM Date: 11/12/16  Weight change: Filed Weights   11/11/16 0600 11/12/16 0500 11/13/16 0415  Weight: 106.6 kg (235 lb 0.2 oz) 103.3 kg (227 lb 11.8 oz) 102.7 kg (226 lb 6.6 oz)    Intake/Output:   Intake/Output Summary (Last 24 hours) at 11/13/16 1939 Last data  filed at 11/13/16 1900  Gross per 24 hour  Intake          2750.13 ml  Output             4475 ml  Net         -1724.87 ml     Physical Exam: CVP 12-13 General: s/p Tracheostomy. NAD.  HEENT: + Trach Neck: supple. JVP jaw. Carotids 2+ bilat; no bruits. No thyromegaly or nodule noted.    Cor: PMI nondisplaced. Mildly tachy, regular.  No M/G/R noted. Lungs: + rhonchi.    Abdomen: soft, NT, ND, no HSM. No bruits or masses. +BS  Extremities: no clubbing, rash. 2+ edema 1/2 way to knees. Gangrenous changes to fingers and toes. Unable to doppler pedal pulses.  Neuro: Trachestomy in place. Awake/alert.  GU: Foley in place  Telemetry: Reviewed, sinus tachy ~ 100 bpm    Labs: CBC  Recent Labs  11/12/16 0500 11/13/16 0415  WBC 9.8 9.4  HGB 9.7* 9.3*  HCT 34.1* 32.7*  MCV 98.6 100.6*  PLT 216 063   Basic Metabolic Panel  Recent Labs  11/12/16 0500 11/13/16 0415  NA 148* 150*  K 3.9 3.8  CL 104 108  CO2 36* 36*  GLUCOSE 151* 134*  BUN 49* 38*  CREATININE 1.04 0.87  CALCIUM 7.9* 8.1*  MG 2.1 2.1  PHOS 3.8  3.3   Liver Function Tests No results for input(s): AST, ALT, ALKPHOS, BILITOT, PROT, ALBUMIN in the last 72 hours. No results for input(s): LIPASE, AMYLASE in the last 72 hours. Cardiac Enzymes No results for input(s): CKTOTAL, CKMB, CKMBINDEX, TROPONINI in the last 72 hours.  BNP: BNP (last 3 results) No results for input(s): BNP in the last 8760 hours.  ProBNP (last 3 results) No results for input(s): PROBNP in the last 8760 hours.   D-Dimer No results for input(s): DDIMER in the last 72 hours. Hemoglobin A1C No results for input(s): HGBA1C in the last 72 hours. Fasting Lipid Panel No results for input(s): CHOL, HDL, LDLCALC, TRIG, CHOLHDL, LDLDIRECT in the last 72 hours. Thyroid Function Tests No results for input(s): TSH, T4TOTAL, T3FREE, THYROIDAB in the last 72 hours.  Invalid input(s): FREET3  Other results:  Imaging/Studies:  Dg Chest Port  1 View  Result Date: 11/13/2016 CLINICAL DATA:  Coronary artery disease, status post coronary artery bypass grafting. Atelectasis. EXAM: PORTABLE CHEST 1 VIEW COMPARISON:  November 10, 2016 FINDINGS: Tracheostomy catheter tip is 7.2 cm above the carina. Feeding tube tip is below the diaphragm. There are chest tubes bilaterally. No pneumothorax. There is patchy atelectasis in lung bases. There is mild interstitial edema. There is no airspace consolidation. There is cardiomegaly with pulmonary vascular within normal limits. Atherosclerotic calcification is noted in the aorta. IMPRESSION: Tube positions as described without pneumothorax. Cardiomegaly with mild interstitial edema suggesting a degree of congestive heart failure. Mild bibasilar atelectasis. No airspace consolidation. There is aortic atherosclerosis. Cardiac silhouette is stable compared to recent prior study. Electronically Signed   By: Lowella Grip III M.D.   On: 11/13/2016 07:19    Medications:     Scheduled Medications: . acyclovir  200 mg Oral QID  . [START ON 11/14/2016] allopurinol  300 mg Per Tube Daily  . amiodarone  200 mg Per Tube BID  . aspirin EC  325 mg Oral Daily   Or  . aspirin  324 mg Per Tube Daily  . atorvastatin  40 mg Per Tube q1800  . chlorhexidine gluconate (MEDLINE KIT)  15 mL Mouth Rinse BID  . famotidine  20 mg Per Tube Q12H  . feeding supplement (PRO-STAT SUGAR FREE 64)  30 mL Per Tube BID  . fentaNYL  50 mcg Transdermal Q72H  . free water  400 mL Per Tube Q6H  . furosemide  20 mg Intravenous Q12H  . insulin glargine  30 Units Subcutaneous BID  . mouth rinse  15 mL Mouth Rinse QID  . metoprolol tartrate  12.5 mg Per Tube BID  . potassium chloride  40 mEq Per Tube Q6H  . rOPINIRole  2 mg Per Tube BID  . sodium chloride flush  3 mL Intravenous Q12H  . vancomycin  500 mg Per Tube Q6H  . Warfarin - Pharmacist Dosing Inpatient   Does not apply q1800    Infusions: . sodium chloride 10 mL/hr at  11/13/16 1900  . dexmedetomidine 0.5 mcg/kg/hr (11/13/16 1900)  . feeding supplement (VITAL HIGH PROTEIN) 1,000 mL (11/13/16 1900)  . insulin (NOVOLIN-R) infusion      PRN Medications: acetaminophen (TYLENOL) oral liquid 160 mg/5 mL, Gerhardt's butt cream, ipratropium-albuterol, metoprolol, midazolam, morphine injection, ondansetron (ZOFRAN) IV, phenol, sodium chloride flush   Assessment/Plan   Scott [name] is 70 year old admitted with CP. Complex operation this admission.  CABG x 3 + bioprosthetic AVR for severe bicuspid AS + ascending aorta replacement + MV repair  for flail P2.  MV repair complicated by severe SAM, ended up having to remove posterior annuloplasty band.  Chest closed on 12/3.   1. CAD:  10/22/16 S/P CABG x3 with chest left open.  Chest successfully closed am of 10/26/16. - Continue ASA, statin 2. Severe aortic stenosis: 11/302017 S/P AVR bioprostheic  3. Mitral regurgitation: Partial flail leaflet with MV repair.  Developed SAM initially after repair and posterior annuloplasty ring had to be removed.  SAM resolved. 4. Acute on chronic systolic CHF:  TEE at time of chest closure showed EF 40-45%, small cavity size, flattened/hypokinetic septum, s/p MV repair with mild Scott, mild to moderately decreased RV systolic function, thrombus in RA. Repeat echo 12/7 with EF 60-65%.  Weaned off off milrinone and phenylephrine.  - CVP 11. Continues to improve. Now with hypernatremia. Reduce lasix. Free H2O started.  5. ID: ?Source of fever - Concerned digital gangrene is a contributor/etiology. - Currently on po vanc and IV flagyl for C difficile.   - ID following.  6. Elevated LFTs: Likely shock liver.   - Resolved 7. Heme: HIT negative, suspect DIC. Platelet count has recovered.  - warfarin for RA thrombus and atrial fibrillation => INR 1.8, off heparin now.   8. VDRF: CCM following, S/p tracheostomy am of 11/09/16.  9. Atrial fibrillation/flutter: Currently sinus   - Continue  couamdin and po amiodarone.  10. PAD: Finger gangrene, mottled toes.  Unable to doppler PT pulses or get TBIs. - Possibly was source of his fever.  - Vascular has seen. Will need amputation of most digits at PIP joints.  11. Hypernatremia - Free H2O started. Agree with decreasing lasix. May be able to stop soon.   Glori Bickers, MD 11/13/2016 7:39 PM   Advanced Heart Failure Team Pager [phone] (M-F; 7a - 4p)  Please contact [facility] for night-coverage after hours (4p -7a ) and weekends on [email or web address]

## 2016-11-13 NOTE — Progress Notes (Signed)
PCCM Progress Note  ADMISSION DATE:  10/20/2016 CONSULTATION DATE: 10/23/2016 REFERRING MD: Ricard Dillon  CHIEF COMPLAINT:  Chest pain  HISTORY OF PRESENT ILLNESS:   70 yo former smoker with known CAD and ascending aorta aneurysm, who was taken to OR 11/30 for cabg x 3, aortic root replacement, MVR replacement, thoracic cavity left open and had extended pump time in OR.  SUBJECTIVE:  No events overnight, had to go back on vent overnight  VITAL SIGNS: BP 101/80   Pulse (!) 106   Temp 98.6 F (37 C)   Resp 10   Ht 5\' 9"  (1.753 m)   Wt 102.7 kg (226 lb 6.6 oz)   SpO2 100%   BMI 33.44 kg/m   HEMODYNAMICS: CVP:  [12 mmHg-13 mmHg] 12 mmHg  INTAKE / OUTPUT: I/O last 3 completed shifts: In: 5758.2 [I.V.:2108.2; Other:60; NG/GT:3590] Out: [id number] [Urine:8000; Stool:375; Chest Tube:200]  PHYSICAL EXAMINATION: General: ill appearing male, NAD  Neuro: Awake, following commands and watching TV HEENT:  Trach in place, [place]/AT, PERRL, EOM-I and MMM, on TC Cardiovascular: irregular, chest closed Lungs:  No wheeze Abdomen:  Soft, non tender Ext: 1+ edema, extremities cool Skin: Ischemic changes in fingers/toes  b/l  CMP Latest Ref Rng & Units 11/13/2016 11/12/2016 11/11/2016  Glucose 65 - 99 mg/dL 134(H) 151(H) 222(H)  BUN 6 - 20 mg/dL 38(H) 49(H) 43(H)  Creatinine 0.61 - 1.24 mg/dL 0.87 1.04 1.00  Sodium 135 - 145 mmol/L 150(H) 148(H) 148(H)  Potassium 3.5 - 5.1 mmol/L 3.8 3.9 3.4(L)  Chloride 101 - 111 mmol/L 108 104 107  CO2 22 - 32 mmol/L 36(H) 36(H) 35(H)  Calcium 8.9 - 10.3 mg/dL 8.1(L) 7.9(L) 7.8(L)  Total Protein 6.5 - 8.1 g/dL - - -  Total Bilirubin 0.3 - 1.2 mg/dL - - -  Alkaline Phos 38 - 126 U/L - - -  AST 15 - 41 U/L - - -  ALT 17 - 63 U/L - - -    CBC Latest Ref Rng & Units 11/13/2016 11/12/2016 11/11/2016  WBC 4.0 - 10.5 K/uL 9.4 9.8 9.4  Hemoglobin 13.0 - 17.0 g/dL 9.3(L) 9.7(L) 9.6(L)  Hematocrit 39.0 - 52.0 % 32.7(L) 34.1(L) 33.8(L)  Platelets 150 - 400 K/uL 164  216 233    ABG    Component Value Date/Time   PHART 7.437 11/10/2016 0340   PCO2ART 45.6 11/10/2016 0340   PO2ART 100 11/10/2016 0340   HCO3 29.9 (H) 11/10/2016 0340   TCO2 36 11/04/2016 1950   ACIDBASEDEF 1.0 10/27/2016 1125   O2SAT 97.3 11/10/2016 0340    CBG (last 3)   Recent Labs  11/13/16 0325 11/13/16 0910 11/13/16 1141  GLUCAP 111* 160* 231*    Imaging: Dg Chest Port 1 View  Result Date: 11/13/2016 CLINICAL DATA:  Coronary artery disease, status post coronary artery bypass grafting. Atelectasis. EXAM: PORTABLE CHEST 1 VIEW COMPARISON:  November 10, 2016 FINDINGS: Tracheostomy catheter tip is 7.2 cm above the carina. Feeding tube tip is below the diaphragm. There are chest tubes bilaterally. No pneumothorax. There is patchy atelectasis in lung bases. There is mild interstitial edema. There is no airspace consolidation. There is cardiomegaly with pulmonary vascular within normal limits. Atherosclerotic calcification is noted in the aorta. IMPRESSION: Tube positions as described without pneumothorax. Cardiomegaly with mild interstitial edema suggesting a degree of congestive heart failure. Mild bibasilar atelectasis. No airspace consolidation. There is aortic atherosclerosis. Cardiac silhouette is stable compared to recent prior study. Electronically Signed   By: Lowella Grip III  M.D.   On: 11/13/2016 07:19    STUDIES:  12.5 dopplers lowers>>>neg 12/6 dopplers arms >>>neg 12/7 abdo us>>>Cholelithiasis without sonographic evidence for acute cholecystitis. No biliary dilatation.Increased hepatic echogenicity, suggestive of steatosis. Ascites. 12/7 echo>>>60-65%, rv poor images but appeared wnl  CULTURES: C diff 12/12 >> positive Blood 12/12 >> Rt thigh wound 12/12 >> neg   ANTIBIOTICS:  Acyclovir 12/10 >> PO vancomycin 12/12 >> Flagyl 12/13 >>  SIGNIFICANT EVENTS: 11/30 surgery, hypoxia 12/4 closed chest 12/7 high fevers, LFt up  LINES/TUBES: 11/30  ETT>>12/18 Trach (CVTS) 12/18>>> 12/04 picc left >>> 11/30 CT x 4>> R CT x 2 12/16>>>  I reviewed CT myself, trach in good position.  ASSESSMENT / PLAN:  Acute hypoxic respiratory failure after CABG with MVR. - Will attempt TC 24/7 at this point. - Titrate O2 for sat of 88-92% - No need for further CXR and ABGs unless problematic  CAD s/p CABG, MVR, aortic root replacement. A fib. Rt atrial clot. - PO amiodarone, ASA - Lasix 20 mg q12 hours - Heparin gtt per pharmacy.  B/l hand/feet ischemia. - Will need further therapy when more stable  Hypernatremia: - Free water 400 mg q6 hours.  C diff colitis. - Enteral vancomycin per ID complete - Acyclovir IV  Anemia of critical illness. - F/u CBC - Transfuse per ICU protocol.  DM. - SSI with lantus  Acute metabolic encephalopathy. - RASS goal 0 to -1 - Continue precedex for now but will need to get that off soon if patient tolerates TC for the next 24 hours to assist with rehab. - Fentanyl drip for pain control, will change to fentanyl patch to minimize fluid.  DVT prophylaxis - heparin gtt SUP - Pepcid Nutrition - tubes feeds Goals of care - Full code  Resolved problems >> DIC  Patient updated bedside.  The patient is critically ill with multiple organ systems failure and requires high complexity decision making for assessment and support, frequent evaluation and titration of therapies, application of advanced monitoring technologies and extensive interpretation of multiple databases.   Critical Care Time devoted to patient care services described in this note is  35  Minutes. This time reflects time of care of this signee Dr Jennet Maduro. This critical care time does not reflect procedure time, or teaching time or supervisory time of PA/NP/Med student/Med Resident etc but could involve care discussion time.  Rush Farmer, M.D. [facility] [place] [facility]. Pager: [phone]. After hours pager:  [phone].

## 2016-11-14 ENCOUNTER — Inpatient Hospital Stay (HOSPITAL_COMMUNITY): Payer: Medicare Other

## 2016-11-14 LAB — GLUCOSE, CAPILLARY
GLUCOSE-CAPILLARY: 126 mg/dL — AB (ref 65–99)
GLUCOSE-CAPILLARY: 137 mg/dL — AB (ref 65–99)
GLUCOSE-CAPILLARY: 149 mg/dL — AB (ref 65–99)
GLUCOSE-CAPILLARY: 152 mg/dL — AB (ref 65–99)
GLUCOSE-CAPILLARY: 155 mg/dL — AB (ref 65–99)
GLUCOSE-CAPILLARY: 168 mg/dL — AB (ref 65–99)
GLUCOSE-CAPILLARY: 179 mg/dL — AB (ref 65–99)
Glucose-Capillary: 140 mg/dL — ABNORMAL HIGH (ref 65–99)
Glucose-Capillary: 141 mg/dL — ABNORMAL HIGH (ref 65–99)
Glucose-Capillary: 153 mg/dL — ABNORMAL HIGH (ref 65–99)

## 2016-11-14 LAB — BASIC METABOLIC PANEL
ANION GAP: 5 (ref 5–15)
BUN: 33 mg/dL — AB (ref 6–20)
CALCIUM: 8.4 mg/dL — AB (ref 8.9–10.3)
CO2: 35 mmol/L — ABNORMAL HIGH (ref 22–32)
CREATININE: 0.75 mg/dL (ref 0.61–1.24)
Chloride: 112 mmol/L — ABNORMAL HIGH (ref 101–111)
GFR calc Af Amer: >60 mL/min (ref >60)
GFR calc non Af Amer: >60 mL/min (ref >60)
GLUCOSE: 147 mg/dL — AB (ref 65–99)
Potassium: 4.5 mmol/L (ref 3.5–5.1)
Sodium: 152 mmol/L — ABNORMAL HIGH (ref 135–145)

## 2016-11-14 LAB — CBC
HEMATOCRIT: 34.1 % — AB (ref 39.0–52.0)
Hemoglobin: 9.4 g/dL — ABNORMAL LOW (ref 13.0–17.0)
MCH: 27.5 pg (ref 26.0–34.0)
MCHC: 27.6 g/dL — ABNORMAL LOW (ref 30.0–36.0)
MCV: 99.7 fL (ref 78.0–100.0)
Platelets: 190 10*3/uL (ref 150–400)
RBC: 3.42 MIL/uL — AB (ref 4.22–5.81)
RDW: 18.2 % — AB (ref 11.5–15.5)
WBC: 7.4 10*3/uL (ref 4.0–10.5)

## 2016-11-14 LAB — PHOSPHORUS: Phosphorus: 3.2 mg/dL (ref 2.5–4.6)

## 2016-11-14 LAB — MAGNESIUM: Magnesium: 2.3 mg/dL (ref 1.7–2.4)

## 2016-11-14 LAB — PROTIME-INR
INR: 1.82
PROTHROMBIN TIME: 21.3 s — AB (ref 11.4–15.2)

## 2016-11-14 MED ORDER — FREE WATER
200.0000 mL | Freq: Three times a day (TID) | Status: DC
  Administered 2016-11-14: 200 mL

## 2016-11-14 MED ORDER — POTASSIUM CHLORIDE 20 MEQ/15ML (10%) PO SOLN
40.0000 meq | Freq: Two times a day (BID) | ORAL | Status: DC
  Administered 2016-11-14 – 2016-11-15 (×3): 40 meq
  Filled 2016-11-14 (×2): qty 30

## 2016-11-14 MED ORDER — INSULIN ASPART 100 UNIT/ML ~~LOC~~ SOLN
2.0000 [IU] | SUBCUTANEOUS | Status: DC
  Administered 2016-11-14: 2 [IU] via SUBCUTANEOUS
  Administered 2016-11-14 – 2016-11-15 (×5): 4 [IU] via SUBCUTANEOUS
  Administered 2016-11-15: 2 [IU] via SUBCUTANEOUS
  Administered 2016-11-15 – 2016-11-16 (×4): 4 [IU] via SUBCUTANEOUS
  Administered 2016-11-16 (×3): 2 [IU] via SUBCUTANEOUS
  Administered 2016-11-17: 4 [IU] via SUBCUTANEOUS
  Administered 2016-11-17: 2 [IU] via SUBCUTANEOUS
  Administered 2016-11-17: 4 [IU] via SUBCUTANEOUS
  Administered 2016-11-18: 2 [IU] via SUBCUTANEOUS

## 2016-11-14 MED ORDER — INSULIN GLARGINE 100 UNIT/ML ~~LOC~~ SOLN
10.0000 [IU] | Freq: Every day | SUBCUTANEOUS | Status: DC
  Administered 2016-11-14: 10 [IU] via SUBCUTANEOUS
  Filled 2016-11-14: qty 0.1

## 2016-11-14 MED ORDER — INSULIN GLARGINE 100 UNIT/ML ~~LOC~~ SOLN
10.0000 [IU] | Freq: Two times a day (BID) | SUBCUTANEOUS | Status: DC
  Administered 2016-11-14 – 2016-11-18 (×9): 10 [IU] via SUBCUTANEOUS
  Filled 2016-11-14 (×9): qty 0.1

## 2016-11-14 MED ORDER — ROPINIROLE HCL 1 MG PO TABS
2.0000 mg | ORAL_TABLET | Freq: Once | ORAL | Status: AC
  Administered 2016-11-14: 2 mg via ORAL
  Filled 2016-11-14: qty 2

## 2016-11-14 MED ORDER — WARFARIN SODIUM 3 MG PO TABS
3.0000 mg | ORAL_TABLET | Freq: Once | ORAL | Status: AC
  Administered 2016-11-14: 3 mg via ORAL
  Filled 2016-11-14: qty 1

## 2016-11-14 MED ORDER — FENTANYL CITRATE (PF) 100 MCG/2ML IJ SOLN
25.0000 ug | INTRAMUSCULAR | Status: DC | PRN
  Administered 2016-11-14 – 2016-11-18 (×11): 100 ug via INTRAVENOUS
  Filled 2016-11-14 (×11): qty 2

## 2016-11-14 NOTE — Progress Notes (Signed)
Spoke with MD Bensimhon regarding insulin drip. OK to leave drip off and continue q4 blood sugars. Blood sugars over night have required the insulin drip to stay off, and required 4 units of insulin this AM when drawn at 0400. Will continue to monitor patient.  Levon Hedger, RN

## 2016-11-14 NOTE — Progress Notes (Addendum)
Patient ID: Scott [name], male   DOB: 1946-07-31, 70 y.o.   MRN: [id number]     Advanced Heart Failure Rounding Note  Referring Physician: Dr Roxy Manns Primary Physician: Dr Quintin Alto Primary Cardiologist:  Dr Percival Spanish   Reason for Consultation: Heart Failure   Subjective:    Admitted with chest pain. RHC/LHC with 3 vessel disease. Taken to the OR 11/30 for cabg x 3, repair of thoracic ascending aneurysm, AVR bioprosthetic,  MVR, and open chest with VAC placement.    Chest successfully closed 10/26/16.   S/p tracheostomy 11/09/16.   Off inotropes. On trach collar. Weaning but gets tired after 6-8 hours and back on vent. C/o intermittent pain. Now sedated.  Lasix decreased to 20 IV bid yesterday due to hypernatremia. Getting Free H2O. Weight down another 6 pounds. CVP 12. Excellent urine output.    Sodium 150 -152  On warfarin INR 1.8 .   ID following. Remains on oral vanc and IV flagyl currently for c.diff. Now afebrile.    Studies:  TEE 10/26/16 EF 40-45%, small cavity size, flattened/hypokinetic septum, s/p MV repair with mild MR, mild to moderately decreased RV systolic function. + RA thrombus.   Limited echo 10/29/16 LVEF 60-65%, unable to comment on AVR structure or function with poor windows.   Upper and lower extremity doppler studies: No DVTs  ABIs (12/8): Unable to doppler PTs, or get TBIs.   Objective:   Weight Range: 99.8 kg (220 lb 0.3 oz) Body mass index is 32.49 kg/m.   Vital Signs:   Temp:  [98.2 F (36.8 C)-100.6 F (38.1 C)] 99.3 F (37.4 C) (12/23 0400) Pulse Rate:  [104-110] 106 (12/23 0400) Resp:  [0-25] 13 (12/23 0400) BP: (86-145)/(62-99) 90/71 (12/23 0400) SpO2:  [97 %-100 %] 100 % (12/23 0400) FiO2 (%):  [40 %] 40 % (12/23 0400) Weight:  [99.8 kg (220 lb 0.3 oz)-102.7 kg (226 lb 6.6 oz)] 99.8 kg (220 lb 0.3 oz) (12/23 0400) Last BM Date: 11/12/16  Weight change: Filed Weights   11/12/16 0500 11/13/16 0415 11/14/16 0400  Weight: 103.3 kg (227  lb 11.8 oz) 102.7 kg (226 lb 6.6 oz) 99.8 kg (220 lb 0.3 oz)    Intake/Output:   Intake/Output Summary (Last 24 hours) at 11/14/16 0408 Last data filed at 11/14/16 0400  Gross per 24 hour  Intake           2663.9 ml  Output             4375 ml  Net          -1711.1 ml     Physical Exam: CVP 12 General: s/p Tracheostomy. NAD.  HEENT: + Trach Neck: supple. JVP 8. Carotids 2+ bilat; no bruits. No thyromegaly or nodule noted.    Cor: PMI nondisplaced. Mildly tachy, regular.  No M/G/R noted. Lungs: clear anteriorly Abdomen: soft, NT, ND, no HSM. No bruits or masses. +BS  Extremities: no clubbing, rash. Tr-1+ edema Gangrenous changes to fingers and toes. Unable to doppler pedal pulses.  Neuro: Trachestomy in place. GU: Foley and rectal tube in place  Telemetry: Reviewed, sinus tachy ~ 100 bpm    Labs: CBC  Recent Labs  11/13/16 0415 11/14/16 0300  WBC 9.4 7.4  HGB 9.3* 9.4*  HCT 32.7* 34.1*  MCV 100.6* 99.7  PLT 164 944   Basic Metabolic Panel  Recent Labs  11/13/16 0415 11/14/16 0300  NA 150* 152*  K 3.8 4.5  CL 108 112*  CO2 36* 35*  GLUCOSE 134* 147*  BUN 38* 33*  CREATININE 0.87 0.75  CALCIUM 8.1* 8.4*  MG 2.1 2.3  PHOS 3.3 3.2   Liver Function Tests No results for input(s): AST, ALT, ALKPHOS, BILITOT, PROT, ALBUMIN in the last 72 hours. No results for input(s): LIPASE, AMYLASE in the last 72 hours. Cardiac Enzymes No results for input(s): CKTOTAL, CKMB, CKMBINDEX, TROPONINI in the last 72 hours.  BNP: BNP (last 3 results) No results for input(s): BNP in the last 8760 hours.  ProBNP (last 3 results) No results for input(s): PROBNP in the last 8760 hours.   D-Dimer No results for input(s): DDIMER in the last 72 hours. Hemoglobin A1C No results for input(s): HGBA1C in the last 72 hours. Fasting Lipid Panel No results for input(s): CHOL, HDL, LDLCALC, TRIG, CHOLHDL, LDLDIRECT in the last 72 hours. Thyroid Function Tests No results for input(s):  TSH, T4TOTAL, T3FREE, THYROIDAB in the last 72 hours.  Invalid input(s): FREET3  Other results:  Imaging/Studies:  Dg Chest Port 1 View  Result Date: 11/13/2016 CLINICAL DATA:  Coronary artery disease, status post coronary artery bypass grafting. Atelectasis. EXAM: PORTABLE CHEST 1 VIEW COMPARISON:  November 10, 2016 FINDINGS: Tracheostomy catheter tip is 7.2 cm above the carina. Feeding tube tip is below the diaphragm. There are chest tubes bilaterally. No pneumothorax. There is patchy atelectasis in lung bases. There is mild interstitial edema. There is no airspace consolidation. There is cardiomegaly with pulmonary vascular within normal limits. Atherosclerotic calcification is noted in the aorta. IMPRESSION: Tube positions as described without pneumothorax. Cardiomegaly with mild interstitial edema suggesting a degree of congestive heart failure. Mild bibasilar atelectasis. No airspace consolidation. There is aortic atherosclerosis. Cardiac silhouette is stable compared to recent prior study. Electronically Signed   By: Lowella Grip III M.D.   On: 11/13/2016 07:19    Medications:     Scheduled Medications: . acyclovir  200 mg Oral QID  . allopurinol  300 mg Per Tube Daily  . amiodarone  200 mg Per Tube BID  . aspirin EC  325 mg Oral Daily   Or  . aspirin  324 mg Per Tube Daily  . atorvastatin  40 mg Per Tube q1800  . chlorhexidine gluconate (MEDLINE KIT)  15 mL Mouth Rinse BID  . famotidine  20 mg Per Tube Q12H  . feeding supplement (PRO-STAT SUGAR FREE 64)  30 mL Per Tube BID  . fentaNYL  50 mcg Transdermal Q72H  . free water  400 mL Per Tube Q6H  . furosemide  20 mg Intravenous Q12H  . insulin aspart  2-6 Units Subcutaneous Q4H  . insulin glargine  10 Units Subcutaneous QHS  . mouth rinse  15 mL Mouth Rinse QID  . metoprolol tartrate  12.5 mg Per Tube BID  . potassium chloride  40 mEq Per Tube Q6H  . rOPINIRole  2 mg Per Tube BID  . sodium chloride flush  3 mL  Intravenous Q12H  . vancomycin  500 mg Per Tube Q6H  . Warfarin - Pharmacist Dosing Inpatient   Does not apply q1800    Infusions: . sodium chloride 10 mL/hr at 11/14/16 0300  . dexmedetomidine 0.5 mcg/kg/hr (11/14/16 0400)  . feeding supplement (VITAL HIGH PROTEIN) 1,000 mL (11/14/16 0400)  . insulin (NOVOLIN-R) infusion Stopped (11/13/16 2100)    PRN Medications: acetaminophen (TYLENOL) oral liquid 160 mg/5 mL, Gerhardt's butt cream, ipratropium-albuterol, metoprolol, midazolam, morphine injection, ondansetron (ZOFRAN) IV, phenol, sodium chloride flush   Assessment/Plan   Mr Rentz  is 70 year old admitted with CP. Complex operation this admission.  CABG x 3 + bioprosthetic AVR for severe bicuspid AS + ascending aorta replacement + MV repair for flail P2.  MV repair complicated by severe SAM, ended up having to remove posterior annuloplasty band.  Chest closed on 12/3.   1. CAD:  10/22/16 S/P CABG x3 with chest left open.  Chest successfully closed am of 10/26/16. - Continue ASA, statin 2. Severe aortic stenosis: 11/302017 S/P AVR bioprostheic  3. Mitral regurgitation: Partial flail leaflet with MV repair.  Developed SAM initially after repair and posterior annuloplasty ring had to be removed.  SAM resolved. 4. Acute on chronic systolic CHF:  TEE at time of chest closure showed EF 40-45%, small cavity size, flattened/hypokinetic septum, s/p MV repair with mild MR, mild to moderately decreased RV systolic function, thrombus in RA. Repeat echo 12/7 with EF 60-65%.  Weaned off off milrinone and phenylephrine.  - Volume status much improved. CVP 8 . Now with progressive hypernatremia.  - Stop lasix. Increase free H2O 5. ID: ?Source of fever - Concerned digital gangrene is a contributor/etiology. - Currently on po vanc and IV flagyl for C difficile.   - ID following.  6. Elevated LFTs: Likely shock liver.   - Resolved 7. Heme: HIT negative, suspect DIC. Platelet count has recovered.  -  warfarin for RA thrombus and atrial fibrillation => INR 1.8, off heparin now.   8. VDRF: CCM following, S/p tracheostomy am of 11/09/16.  9. Atrial fibrillation/flutter: Currently sinus   - Continue couamdin and po amiodarone.  10. PAD: Finger gangrene, mottled toes.  Unable to doppler PT pulses or get TBIs. - Possibly was source of his fever.  - Vascular has seen. Will need amputation of most digits at PIP joints.  11. Hypernatremia - Stop lasix. Increase Free H2O started.   Will need [facility] at some point if he qualifies.   Glori Bickers, MD 11/14/2016 4:08 AM   Advanced Heart Failure Team Pager [phone] (M-F; 7a - 4p)  Please contact [facility] for night-coverage after hours (4p -7a ) and weekends on [email or web address]

## 2016-11-14 NOTE — Progress Notes (Signed)
T[facility] BRIEF SICU PROGRESS NOTE  5 Days Post-Op  S/P Procedure(s) (LRB): TRACHEOSTOMY (N/A) VIDEO BRONCHOSCOPY (N/A)   Stable day On T-collar most of the day Up in chair 2 1/2 hours  Plan: Continue current plan  Rexene Alberts, MD 11/14/2016 5:09 PM

## 2016-11-14 NOTE — Progress Notes (Signed)
      [address]       [place] [address]             [phone]        CARDIOTHORACIC SURGERY PROGRESS NOTE   R23 Days Post-Op Procedure(s) (LRB): CORONARY ARTERY BYPASS GRAFTING (CABG)x 2 WITH LIMA TO DIAGONAL, OPEN  HARVESTING OF RIGHT SAPHENOUS VEIN FOR VEIN GRAFT TO LAD (N/A) AORTIC VALVE REPLACEMENT (AVR) WITH SIZE 25 MM MAGNA EASE PERICARDIAL BIOPROSTHESIS - AORTIC (N/A) TRANSESOPHAGEAL ECHOCARDIOGRAM (TEE) (N/A) MITRAL VALVE REPAIR (MVR) WITH SIZE 30 SORIN ANNULOFLEX ANNULOPLASTY RING WITH SUBSEQUENT REMOVAL OF RING (N/A) ASCENDING AORTIC  ANEURYSM REPAIR (AAA) WITH 28 MM HEMASHIELD PLATINUM WOVEN DOUBLE VELOUR VASCULAR GRAFT   R5 Days Post-Op Procedure(s) (LRB): TRACHEOSTOMY (N/A) VIDEO BRONCHOSCOPY (N/A)  Subjective: Awake and alert.  Comfortable.  Denies pain.  Objective: Vital signs: BP Readings from Last 1 Encounters:  11/14/16 92/74   Pulse Readings from Last 1 Encounters:  11/14/16 97   Resp Readings from Last 1 Encounters:  11/14/16 12   Temp Readings from Last 1 Encounters:  11/14/16 97.7 F (36.5 C) (Oral)    Hemodynamics: CVP:  [8 mmHg-12 mmHg] 9 mmHg  Physical Exam:  Rhythm:   Sinus tach  Breath sounds: clear  Heart sounds:  RRR w/out murmur  Incisions:  Sternal incision looks good.  Dry eschar right thigh - stable  Abdomen:  Soft, non-distended, non-tender  Extremities:  Warm, adequately perfused, no significant change in appearance of fingers and toes   Intake/Output from previous day: 12/22 0701 - 12/23 0700 In: 3026.4 [I.V.:576.4; NG/GT:2450] Out: 4065 [Urine:3795; Stool:250; Chest Tube:20] Intake/Output this shift: No intake/output data recorded.  Lab Results:  CBC: Recent Labs  11/13/16 0415 11/14/16 0300  WBC 9.4 7.4  HGB 9.3* 9.4*  HCT 32.7* 34.1*  PLT 164 190    BMET:  Recent Labs  11/13/16 0415 11/14/16 0300  NA 150* 152*  K 3.8 4.5  CL 108 112*  CO2 36* 35*  GLUCOSE 134* 147*  BUN 38*  33*  CREATININE 0.87 0.75  CALCIUM 8.1* 8.4*     PT/INR:   Recent Labs  11/14/16 0300  LABPROT 21.3*  INR 1.82    CBG (last 3)   Recent Labs  11/14/16 0303 11/14/16 0346 11/14/16 0917  GLUCAP 141* 179* 155*    ABG    Component Value Date/Time   PHART 7.437 11/10/2016 0340   PCO2ART 45.6 11/10/2016 0340   PO2ART 100 11/10/2016 0340   HCO3 29.9 (H) 11/10/2016 0340   TCO2 36 11/04/2016 1950   ACIDBASEDEF 1.0 10/27/2016 1125   O2SAT 97.3 11/10/2016 0340    CXR: Looks remarkably clear  Assessment/Plan:  Clinically stable, slowly improving Continue trach trials Will stop lasix and continue added free water Mobilize out of bed PT/OT consult  Consider d/c to [facility] some time next week if he continues to improve - I discussed progress and plans w/ patient's wife  Rexene Alberts, MD 11/14/2016 9:26 AM

## 2016-11-14 NOTE — Progress Notes (Signed)
11ml iv fent wasted. Witnessed by second [employer].

## 2016-11-14 NOTE — Progress Notes (Signed)
ANTICOAGULATION CONSULT NOTE   Pharmacy Consult for Coumadin Indication: RA clot and afib   Allergies  Allergen Reactions  . Doxycycline Hives  . Penicillins Hives     Has patient had a PCN reaction causing immediate rash, facial/tongue/throat swelling, SOB or lightheadedness with hypotension: No Has patient had a PCN reaction causing severe rash involving mucus membranes or skin necrosis: No Has patient had a PCN reaction that required hospitalization: No Has patient had a PCN reaction occurring within the last 10 years:# # # YES # # #  If all of the above answers are "NO", then may proceed with Cephalosporin use.   . Sulfa Antibiotics Hives    Patient Measurements: Height: 5\' 9"  (175.3 cm) Weight: 220 lb 0.3 oz (99.8 kg) IBW/kg (Calculated) : 70.7  Heparin dosing wt: 97 kg  Vital Signs: Temp: 97.9 F (36.6 C) (12/23 1305) Temp Source: Oral (12/23 1305) BP: 93/67 (12/23 1119) Pulse Rate: 100 (12/23 1119)  Labs:  Recent Labs  11/12/16 0500 11/13/16 0415 11/14/16 0300  HGB 9.7* 9.3* 9.4*  HCT 34.1* 32.7* 34.1*  PLT 216 164 190  LABPROT 21.2* 21.3* 21.3*  INR 1.81 1.82 1.82  CREATININE 1.04 0.87 0.75    Estimated Creatinine Clearance: 100 mL/min (by C-G formula based on SCr of 0.75 mg/dL).  Assessment: 70yom on heparin for possible right atrial clot and afib. HIT ruled out (low heparin antibody and SRA negative).  Received coumadin 12/19 then held 12/20 with INR of 2.6 (no true baseline).  INR now stable (unchanged) at 1.8 after 2mg  daily x3 days. No overt bleeding noted.    Since INR remains unchanged so will start to titrate up dose.   Amio reduced to 200 bid. Receiving TFs via ngt   Goal of Therapy: . INR goal 2-3 Monitor platelets by anticoagulation protocol: Yes   Plan:  Warfarin 3mg  tonight x1 Continue Daily INR checks  Sloan Leiter, PharmD, BCPS Clinical Pharmacist [phone] 11/14/2016 2:33 PM

## 2016-11-14 NOTE — Progress Notes (Signed)
PCCM Progress Note  ADMISSION DATE:  10/20/2016 CONSULTATION DATE: 10/23/2016 REFERRING MD: Ricard Dillon  CHIEF COMPLAINT:  Chest pain  HISTORY OF PRESENT ILLNESS:   70 yo former smoker with known CAD and ascending aorta aneurysm, who was taken to OR 11/30 for cabg x 3, aortic root replacement, MVR replacement, thoracic cavity left open and had extended pump time in OR.  SUBJECTIVE:  Low gr febrile Remains critically ill, weaning on PS  VITAL SIGNS: BP 93/67 (BP Location: Right Arm)   Pulse (!) 43   Temp 97.7 F (36.5 C) (Core (Comment))   Resp (!) 21   Ht 5\' 9"  (1.753 m)   Wt 220 lb 0.3 oz (99.8 kg)   SpO2 100%   BMI 32.49 kg/m   HEMODYNAMICS: CVP:  [8 mmHg-12 mmHg] 9 mmHg  INTAKE / OUTPUT: I/O last 3 completed shifts: In: 4828.5 [I.V.:1058.5; NG/GT:3770] Out: NU:[id number]; Stool:450; Chest Tube:50]  PHYSICAL EXAMINATION: General: ill appearing male, NAD  Neuro: Awake, following commands  HEENT:  Trach in place, [place]/AT, PERRL, EOM-I and MMM, on TC Cardiovascular: irregular, chest closed Lungs:  No wheeze Abdomen:  Soft, non tender Ext: 1+ edema, extremities cool Skin: Ischemic changes in fingers/toes  b/l  CMP Latest Ref Rng & Units 11/14/2016 11/13/2016 11/12/2016  Glucose 65 - 99 mg/dL 147(H) 134(H) 151(H)  BUN 6 - 20 mg/dL 33(H) 38(H) 49(H)  Creatinine 0.61 - 1.24 mg/dL 0.75 0.87 1.04  Sodium 135 - 145 mmol/L 152(H) 150(H) 148(H)  Potassium 3.5 - 5.1 mmol/L 4.5 3.8 3.9  Chloride 101 - 111 mmol/L 112(H) 108 104  CO2 22 - 32 mmol/L 35(H) 36(H) 36(H)  Calcium 8.9 - 10.3 mg/dL 8.4(L) 8.1(L) 7.9(L)  Total Protein 6.5 - 8.1 g/dL - - -  Total Bilirubin 0.3 - 1.2 mg/dL - - -  Alkaline Phos 38 - 126 U/L - - -  AST 15 - 41 U/L - - -  ALT 17 - 63 U/L - - -    CBC Latest Ref Rng & Units 11/14/2016 11/13/2016 11/12/2016  WBC 4.0 - 10.5 K/uL 7.4 9.4 9.8  Hemoglobin 13.0 - 17.0 g/dL 9.4(L) 9.3(L) 9.7(L)  Hematocrit 39.0 - 52.0 % 34.1(L) 32.7(L) 34.1(L)  Platelets 150  - 400 K/uL 190 164 216    ABG    Component Value Date/Time   PHART 7.437 11/10/2016 0340   PCO2ART 45.6 11/10/2016 0340   PO2ART 100 11/10/2016 0340   HCO3 29.9 (H) 11/10/2016 0340   TCO2 36 11/04/2016 1950   ACIDBASEDEF 1.0 10/27/2016 1125   O2SAT 97.3 11/10/2016 0340    CBG (last 3)   Recent Labs  11/14/16 0303 11/14/16 0346 11/14/16 0917  GLUCAP 141* 179* 155*    Imaging: Dg Chest Port 1 View  Result Date: 11/14/2016 CLINICAL DATA:  Atelectasis EXAM: PORTABLE CHEST 1 VIEW COMPARISON:  11/13/2016 FINDINGS: Tracheostomy in satisfactory position. Mild patchy left lower lobe opacity, suspicious for pneumonia, less likely atelectasis. Right lung is clear. No pleural effusion or pneumothorax. Cardiomegaly.  Postsurgical changes related to prior CABG. Enteric tube coursing into the stomach. Median sternotomy. IMPRESSION: Mild patchy left lower lobe opacity, suspicious for pneumonia, less likely atelectasis. Electronically Signed   By: Julian Hy M.D.   On: 11/14/2016 09:46   Dg Chest Port 1 View  Result Date: 11/13/2016 CLINICAL DATA:  Coronary artery disease, status post coronary artery bypass grafting. Atelectasis. EXAM: PORTABLE CHEST 1 VIEW COMPARISON:  November 10, 2016 FINDINGS: Tracheostomy catheter tip is 7.2 cm above the  carina. Feeding tube tip is below the diaphragm. There are chest tubes bilaterally. No pneumothorax. There is patchy atelectasis in lung bases. There is mild interstitial edema. There is no airspace consolidation. There is cardiomegaly with pulmonary vascular within normal limits. Atherosclerotic calcification is noted in the aorta. IMPRESSION: Tube positions as described without pneumothorax. Cardiomegaly with mild interstitial edema suggesting a degree of congestive heart failure. Mild bibasilar atelectasis. No airspace consolidation. There is aortic atherosclerosis. Cardiac silhouette is stable compared to recent prior study. Electronically Signed    By: Lowella Grip III M.D.   On: 11/13/2016 07:19    STUDIES:  12.5 dopplers lowers>>>neg 12/6 dopplers arms >>>neg 12/7 abdo us>>>Cholelithiasis without sonographic evidence for acute cholecystitis. No biliary dilatation.Increased hepatic echogenicity, suggestive of steatosis. Ascites. 12/7 echo>>>60-65%, rv poor images but appeared wnl  CULTURES: C diff 12/12 >> positive Blood 12/12, 12/16 >>ng Rt thigh wound 12/12 >> neg   ANTIBIOTICS:  Acyclovir 12/10 >> PO vancomycin 12/12 >> Flagyl 12/13 >>  SIGNIFICANT EVENTS: 11/30 surgery, hypoxia 12/4 closed chest 12/7 high fevers, LFt up  LINES/TUBES: 11/30 ETT>>12/18 Trach (CVTS) 12/18>>> 12/04 picc left >>> 11/30 CT x 4>> R CT x 2 12/16>>>   ASSESSMENT / PLAN:  Acute hypoxic respiratory failure after CABG with MVR. - Will attempt TC 24/7 at this point, tolerates PS 8/5   CAD s/p CABG, MVR, aortic root replacement. A fib. Rt atrial clot. - PO amiodarone, ASA - Heparin gtt per pharmacy.  B/l hand/feet ischemia. - Will need further therapy when more stable  Hypernatremia: - Free water 400 mg q6 hours. -lasix stopped  C diff colitis. - Enteral vancomycin per ID complete - on Acyclovir IV since 12/10 - no clear indication on my review -defer to ID , should stop  Anemia of critical illness. - F/u CBC - Transfuse per ICU protocol.  DM. - SSI with lantus  Acute metabolic encephalopathy. - RASS goal 0 to -1 - Can dc precedex  - dc Fentanyl drip , use  fentanyl patch   DVT prophylaxis - heparin gtt SUP - Pepcid Nutrition - tube feeds Goals of care - Full code  Resolved problems >> DIC  Cc time x 35m  Kara Mead MD. FCCP. [facility] Pager [phone] If no response call [phone]   11/14/2016

## 2016-11-15 DIAGNOSIS — E870 Hyperosmolality and hypernatremia: Secondary | ICD-10-CM

## 2016-11-15 DIAGNOSIS — Z930 Tracheostomy status: Secondary | ICD-10-CM

## 2016-11-15 LAB — CBC
HCT: 36.2 % — ABNORMAL LOW (ref 39.0–52.0)
Hemoglobin: 10 g/dL — ABNORMAL LOW (ref 13.0–17.0)
MCH: 27.9 pg (ref 26.0–34.0)
MCHC: 27.6 g/dL — AB (ref 30.0–36.0)
MCV: 100.8 fL — ABNORMAL HIGH (ref 78.0–100.0)
PLATELETS: 209 10*3/uL (ref 150–400)
RBC: 3.59 MIL/uL — ABNORMAL LOW (ref 4.22–5.81)
RDW: 18.4 % — AB (ref 11.5–15.5)
WBC: 10.5 10*3/uL (ref 4.0–10.5)

## 2016-11-15 LAB — GLUCOSE, CAPILLARY
GLUCOSE-CAPILLARY: 163 mg/dL — AB (ref 65–99)
GLUCOSE-CAPILLARY: 170 mg/dL — AB (ref 65–99)
GLUCOSE-CAPILLARY: 126 mg/dL — AB (ref 65–99)
Glucose-Capillary: 104 mg/dL — ABNORMAL HIGH (ref 65–99)
Glucose-Capillary: 159 mg/dL — ABNORMAL HIGH (ref 65–99)
Glucose-Capillary: 165 mg/dL — ABNORMAL HIGH (ref 65–99)

## 2016-11-15 LAB — BASIC METABOLIC PANEL
Anion gap: 8 (ref 5–15)
BUN: 34 mg/dL — ABNORMAL HIGH (ref 6–20)
CALCIUM: 8.6 mg/dL — AB (ref 8.9–10.3)
CO2: 30 mmol/L (ref 22–32)
CREATININE: 0.71 mg/dL (ref 0.61–1.24)
Chloride: 111 mmol/L (ref 101–111)
GFR calc Af Amer: >60 mL/min (ref >60)
GFR calc non Af Amer: >60 mL/min (ref >60)
GLUCOSE: 131 mg/dL — AB (ref 65–99)
Potassium: 4.1 mmol/L (ref 3.5–5.1)
Sodium: 149 mmol/L — ABNORMAL HIGH (ref 135–145)

## 2016-11-15 LAB — PROTIME-INR
INR: 1.76
PROTHROMBIN TIME: 20.7 s — AB (ref 11.4–15.2)

## 2016-11-15 MED ORDER — ALTEPLASE 2 MG IJ SOLR
2.0000 mg | Freq: Once | INTRAMUSCULAR | Status: AC
  Administered 2016-11-15: 2 mg

## 2016-11-15 MED ORDER — WARFARIN SODIUM 4 MG PO TABS
4.0000 mg | ORAL_TABLET | Freq: Once | ORAL | Status: AC
  Administered 2016-11-15: 4 mg via ORAL
  Filled 2016-11-15: qty 1

## 2016-11-15 MED ORDER — METOPROLOL TARTRATE 25 MG/10 ML ORAL SUSPENSION
25.0000 mg | Freq: Two times a day (BID) | ORAL | Status: DC
  Administered 2016-11-15 – 2016-11-16 (×3): 25 mg
  Filled 2016-11-15 (×3): qty 10

## 2016-11-15 MED ORDER — WARFARIN SODIUM 5 MG PO TABS: 5.0000 mg | ORAL_TABLET | Freq: Once | ORAL | Status: DC

## 2016-11-15 MED ORDER — WARFARIN SODIUM 3 MG PO TABS
4.0000 mg | ORAL_TABLET | Freq: Once | ORAL | Status: DC
  Filled 2016-11-15: qty 0.5

## 2016-11-15 MED ORDER — LISINOPRIL 10 MG PO TABS
10.0000 mg | ORAL_TABLET | Freq: Every day | ORAL | Status: DC
Start: 2016-11-15 — End: 2016-11-16
  Administered 2016-11-15: 10 mg
  Filled 2016-11-15: qty 1

## 2016-11-15 NOTE — Progress Notes (Signed)
Placed on the vent at this time per pt request

## 2016-11-15 NOTE — Progress Notes (Signed)
ANTICOAGULATION CONSULT NOTE   Pharmacy Consult for Coumadin Indication: RA clot and afib   Allergies  Allergen Reactions  . Doxycycline Hives  . Penicillins Hives     Has patient had a PCN reaction causing immediate rash, facial/tongue/throat swelling, SOB or lightheadedness with hypotension: No Has patient had a PCN reaction causing severe rash involving mucus membranes or skin necrosis: No Has patient had a PCN reaction that required hospitalization: No Has patient had a PCN reaction occurring within the last 10 years:# # # YES # # #  If all of the above answers are "NO", then may proceed with Cephalosporin use.   . Sulfa Antibiotics Hives    Patient Measurements: Height: 5\' 9"  (175.3 cm) Weight: 218 lb 0.6 oz (98.9 kg) IBW/kg (Calculated) : 70.7  Heparin dosing wt: 97 kg  Vital Signs: Temp: 98.4 F (36.9 C) (12/24 1000) Temp Source: Core (Comment) (12/24 0800) BP: 134/87 (12/24 1000) Pulse Rate: 109 (12/24 1000)  Labs:  Recent Labs  11/13/16 0415 11/14/16 0300 11/15/16 0415  HGB 9.3* 9.4* 10.0*  HCT 32.7* 34.1* 36.2*  PLT 164 190 209  LABPROT 21.3* 21.3* 20.7*  INR 1.82 1.82 1.76  CREATININE 0.87 0.75 0.71    Estimated Creatinine Clearance: 99.7 mL/min (by C-G formula based on SCr of 0.71 mg/dL).  Assessment: 70yom on heparin for possible right atrial clot and afib. HIT ruled out (low heparin antibody and SRA negative). INR today 1.76 down,  Hgb and plts improving.    Goal of Therapy: . INR goal 2-3 Monitor platelets by anticoagulation protocol: Yes   Plan:  Warfarin 4 mg tonight Daily INR  Scott [name] S. Alford Highland, PharmD, [facility] Clinical Staff Pharmacist Pager [phone]  11/15/2016 11:28 AM

## 2016-11-15 NOTE — Progress Notes (Signed)
PCCM Progress Note  ADMISSION DATE:  10/20/2016 CONSULTATION DATE: 10/23/2016 REFERRING MD: Ricard Dillon  CHIEF COMPLAINT:  Chest pain  HISTORY OF PRESENT ILLNESS:   70 yo former smoker with known CAD and ascending aorta aneurysm, who was taken to OR 11/30 for cabg x 3, aortic root replacement, MVR replacement, thoracic cavity left open and had extended pump time in OR.  SUBJECTIVE:  Afebrile On ATc since am  VITAL SIGNS: BP (!) 154/94   Pulse (!) 112   Temp 99 F (37.2 C)   Resp (!) 25   Ht 5\' 9"  (1.753 m)   Wt 218 lb 0.6 oz (98.9 kg)   SpO2 100%   BMI 32.20 kg/m   HEMODYNAMICS: CVP:  [7 mmHg-9 mmHg] 9 mmHg  INTAKE / OUTPUT: I/O last 3 completed shifts: In: 4562.8 [I.V.:592.8; NG/GT:3970] Out: [id number] [Urine:3860; Stool:650]  PHYSICAL EXAMINATION: General: ill appearing male, NAD  Neuro: Awake, following commands  HEENT:  Trach in place, [place]/AT, PERRL, EOM-I and MMM, on TC Cardiovascular: irregular, chest closed Lungs:  No wheeze Abdomen:  Soft, non tender Ext: 1+ edema, extremities cool Skin: Ischemic changes in fingers/toes  b/l  CMP Latest Ref Rng & Units 11/15/2016 11/14/2016 11/13/2016  Glucose 65 - 99 mg/dL 131(H) 147(H) 134(H)  BUN 6 - 20 mg/dL 34(H) 33(H) 38(H)  Creatinine 0.61 - 1.24 mg/dL 0.71 0.75 0.87  Sodium 135 - 145 mmol/L 149(H) 152(H) 150(H)  Potassium 3.5 - 5.1 mmol/L 4.1 4.5 3.8  Chloride 101 - 111 mmol/L 111 112(H) 108  CO2 22 - 32 mmol/L 30 35(H) 36(H)  Calcium 8.9 - 10.3 mg/dL 8.6(L) 8.4(L) 8.1(L)  Total Protein 6.5 - 8.1 g/dL - - -  Total Bilirubin 0.3 - 1.2 mg/dL - - -  Alkaline Phos 38 - 126 U/L - - -  AST 15 - 41 U/L - - -  ALT 17 - 63 U/L - - -    CBC Latest Ref Rng & Units 11/15/2016 11/14/2016 11/13/2016  WBC 4.0 - 10.5 K/uL 10.5 7.4 9.4  Hemoglobin 13.0 - 17.0 g/dL 10.0(L) 9.4(L) 9.3(L)  Hematocrit 39.0 - 52.0 % 36.2(L) 34.1(L) 32.7(L)  Platelets 150 - 400 K/uL 209 190 164    ABG    Component Value Date/Time   PHART 7.437  11/10/2016 0340   PCO2ART 45.6 11/10/2016 0340   PO2ART 100 11/10/2016 0340   HCO3 29.9 (H) 11/10/2016 0340   TCO2 36 11/04/2016 1950   ACIDBASEDEF 1.0 10/27/2016 1125   O2SAT 97.3 11/10/2016 0340    CBG (last 3)   Recent Labs  11/15/16 0011 11/15/16 0317 11/15/16 0913  GLUCAP 165* 104* 163*    Imaging: Dg Chest Port 1 View  Result Date: 11/14/2016 CLINICAL DATA:  Atelectasis EXAM: PORTABLE CHEST 1 VIEW COMPARISON:  11/13/2016 FINDINGS: Tracheostomy in satisfactory position. Mild patchy left lower lobe opacity, suspicious for pneumonia, less likely atelectasis. Right lung is clear. No pleural effusion or pneumothorax. Cardiomegaly.  Postsurgical changes related to prior CABG. Enteric tube coursing into the stomach. Median sternotomy. IMPRESSION: Mild patchy left lower lobe opacity, suspicious for pneumonia, less likely atelectasis. Electronically Signed   By: Julian Hy M.D.   On: 11/14/2016 09:46    STUDIES:  12.5 dopplers lowers>>>neg 12/6 dopplers arms >>>neg 12/7 abdo us>>>Cholelithiasis without sonographic evidence for acute cholecystitis. No biliary dilatation.Increased hepatic echogenicity, suggestive of steatosis. Ascites. 12/7 echo>>>60-65%, rv poor images but appeared wnl  CULTURES: C diff 12/12 >> positive Blood 12/12, 12/16 >>ng Rt thigh wound 12/12 >>  neg   ANTIBIOTICS:  Acyclovir 12/10 >>12/23 PO vancomycin 12/12 >> Flagyl 12/13 >>  SIGNIFICANT EVENTS: 11/30 surgery, hypoxia 12/4 closed chest 12/7 high fevers, LFt up 12/24 ATC  LINES/TUBES: 11/30 ETT>>12/18 Trach (CVTS) 12/18>>> 12/04 picc left >>> 11/30 CT x 4>> R CT x 2 12/16>>>   ASSESSMENT / PLAN:  Acute hypoxic respiratory failure after CABG with MVR. - Will attempt TC 24/7 at this point  -Speech eval for PM valve if he tolerates   CAD s/p CABG, MVR, aortic root replacement. A fib. Rt atrial clot. - PO amiodarone, ASA - Heparin gtt per pharmacy.  B/l hand/feet ischemia. -  Will need further therapy when more stable  Hypernatremia: - Free water 400 mg q6 hours. -lasix stopped  C diff colitis. - Enteral vancomycin per ID complete - on Acyclovir IV since 12/10 for upper lip lesions --dc'd  Anemia of critical illness. - F/u CBC - Transfuse per ICU protocol.  DM. - SSI with lantus  Acute metabolic encephalopathy. - RASS goal 0 to -1 - dc precedex  - dc Fentanyl drip , use  fentanyl patch or prn  DVT prophylaxis - heparin gtt SUP - Pepcid Nutrition - tube feeds Goals of care - Full code  Resolved problems >> DIC  PCCM will see again Tuesday  Kara Mead MD. [facility]. [place] Pager [phone] If no response call [phone]   11/15/2016

## 2016-11-15 NOTE — Progress Notes (Signed)
Patient ID: Scott [name], male   DOB: December 08, 1945, 70 y.o.   MRN: [id number]     Advanced Heart Failure Rounding Note  Referring Physician: Dr Roxy Manns Primary Physician: Dr Quintin Alto Primary Cardiologist:  Dr Percival Spanish   Reason for Consultation: Heart Failure   Subjective:    Admitted with chest pain. RHC/LHC with 3 vessel disease. Taken to the OR 11/30 for cabg x 3, repair of thoracic ascending aneurysm, AVR bioprosthetic,  MVR, and open chest with VAC placement.    Chest successfully closed 10/26/16.   S/p tracheostomy 11/09/16.   Off inotropes. On trach collar. No pain. Weight down. BP climbing. Dr. Roxy Manns increased metoprolol and added lisinopril 10.     Sodium 152-> 149 with free H2O  On warfarin INR 1.8 .   ID following. Remains on oral vanc and IV flagyl currently for c.diff. Now afebrile.    Studies:  TEE 10/26/16 EF 40-45%, small cavity size, flattened/hypokinetic septum, s/p MV repair with mild MR, mild to moderately decreased RV systolic function. + RA thrombus.   Limited echo 10/29/16 LVEF 60-65%, unable to comment on AVR structure or function with poor windows.   Upper and lower extremity doppler studies: No DVTs  ABIs (12/8): Unable to doppler PTs, or get TBIs.   Objective:   Weight Range: 98.9 kg (218 lb 0.6 oz) Body mass index is 32.2 kg/m.   Vital Signs:   Temp:  [97.2 F (36.2 C)-100.8 F (38.2 C)] 98.4 F (36.9 C) (12/24 1000) Pulse Rate:  [43-144] 109 (12/24 1000) Resp:  [0-28] 12 (12/24 1000) BP: (93-165)/(67-107) 134/87 (12/24 1000) SpO2:  [100 %] 100 % (12/24 1000) FiO2 (%):  [35 %-40 %] 35 % (12/24 0851) Weight:  [98.9 kg (218 lb 0.6 oz)] 98.9 kg (218 lb 0.6 oz) (12/24 0457) Last BM Date: 11/14/16  Weight change: Filed Weights   11/13/16 0415 11/14/16 0400 11/15/16 0457  Weight: 102.7 kg (226 lb 6.6 oz) 99.8 kg (220 lb 0.3 oz) 98.9 kg (218 lb 0.6 oz)    Intake/Output:   Intake/Output Summary (Last 24 hours) at 11/15/16 1008 Last data filed  at 11/15/16 1000  Gross per 24 hour  Intake             2388 ml  Output             1990 ml  Net              398 ml     Physical Exam: CVP 9 General: s/p Tracheostomy. NAD.  HEENT: + Trach Neck: supple. JVP 8-9. Carotids 2+ bilat; no bruits. No thyromegaly or nodule noted.    Cor: PMI nondisplaced. Mildly tachy, regular.  No M/G/R noted. Lungs: clear anteriorly Abdomen: soft, NT, ND, no HSM. No bruits or masses. +BS  Extremities: no clubbing, rash. Tr edema Gangrenous changes to fingers and toes. Unable to doppler pedal pulses.  Neuro: Trachestomy in place. GU: Foley and rectal tube in place  Telemetry: Reviewed, sinus tachy ~ 100-110 bpm    Labs: CBC  Recent Labs  11/14/16 0300 11/15/16 0415  WBC 7.4 10.5  HGB 9.4* 10.0*  HCT 34.1* 36.2*  MCV 99.7 100.8*  PLT 190 347   Basic Metabolic Panel  Recent Labs  11/13/16 0415 11/14/16 0300 11/15/16 0415  NA 150* 152* 149*  K 3.8 4.5 4.1  CL 108 112* 111  CO2 36* 35* 30  GLUCOSE 134* 147* 131*  BUN 38* 33* 34*  CREATININE 0.87 0.75 0.71  CALCIUM 8.1* 8.4* 8.6*  MG 2.1 2.3  --   PHOS 3.3 3.2  --    Liver Function Tests No results for input(s): AST, ALT, ALKPHOS, BILITOT, PROT, ALBUMIN in the last 72 hours. No results for input(s): LIPASE, AMYLASE in the last 72 hours. Cardiac Enzymes No results for input(s): CKTOTAL, CKMB, CKMBINDEX, TROPONINI in the last 72 hours.  BNP: BNP (last 3 results) No results for input(s): BNP in the last 8760 hours.  ProBNP (last 3 results) No results for input(s): PROBNP in the last 8760 hours.   D-Dimer No results for input(s): DDIMER in the last 72 hours. Hemoglobin A1C No results for input(s): HGBA1C in the last 72 hours. Fasting Lipid Panel No results for input(s): CHOL, HDL, LDLCALC, TRIG, CHOLHDL, LDLDIRECT in the last 72 hours. Thyroid Function Tests No results for input(s): TSH, T4TOTAL, T3FREE, THYROIDAB in the last 72 hours.  Invalid input(s): FREET3  Other  results:  Imaging/Studies:  Dg Chest Port 1 View  Result Date: 11/14/2016 CLINICAL DATA:  Atelectasis EXAM: PORTABLE CHEST 1 VIEW COMPARISON:  11/13/2016 FINDINGS: Tracheostomy in satisfactory position. Mild patchy left lower lobe opacity, suspicious for pneumonia, less likely atelectasis. Right lung is clear. No pleural effusion or pneumothorax. Cardiomegaly.  Postsurgical changes related to prior CABG. Enteric tube coursing into the stomach. Median sternotomy. IMPRESSION: Mild patchy left lower lobe opacity, suspicious for pneumonia, less likely atelectasis. Electronically Signed   By: Julian Hy M.D.   On: 11/14/2016 09:46    Medications:     Scheduled Medications: . allopurinol  300 mg Per Tube Daily  . amiodarone  200 mg Per Tube BID  . aspirin EC  325 mg Oral Daily   Or  . aspirin  324 mg Per Tube Daily  . atorvastatin  40 mg Per Tube q1800  . chlorhexidine gluconate (MEDLINE KIT)  15 mL Mouth Rinse BID  . famotidine  20 mg Per Tube Q12H  . feeding supplement (PRO-STAT SUGAR FREE 64)  30 mL Per Tube BID  . fentaNYL  50 mcg Transdermal Q72H  . free water  400 mL Per Tube Q6H  . insulin aspart  2-6 Units Subcutaneous Q4H  . insulin glargine  10 Units Subcutaneous BID  . lisinopril  10 mg Per Tube Daily  . mouth rinse  15 mL Mouth Rinse QID  . metoprolol tartrate  25 mg Per Tube BID  . potassium chloride  40 mEq Per Tube BID  . rOPINIRole  2 mg Per Tube BID  . sodium chloride flush  3 mL Intravenous Q12H  . vancomycin  500 mg Per Tube Q6H  . Warfarin - Pharmacist Dosing Inpatient   Does not apply q1800    Infusions: . sodium chloride 10 mL/hr at 11/15/16 1000  . feeding supplement (VITAL HIGH PROTEIN) 1,000 mL (11/15/16 1000)    PRN Medications: acetaminophen (TYLENOL) oral liquid 160 mg/5 mL, fentaNYL (SUBLIMAZE) injection, Gerhardt's butt cream, ipratropium-albuterol, metoprolol, midazolam, morphine injection, ondansetron (ZOFRAN) IV, phenol, sodium chloride  flush   Assessment/Plan   Mr Yoss is 70 year old admitted with CP. Complex operation this admission.  CABG x 3 + bioprosthetic AVR for severe bicuspid AS + ascending aorta replacement + MV repair for flail P2.  MV repair complicated by severe SAM, ended up having to remove posterior annuloplasty band.  Chest closed on 12/3.   1. CAD:  10/22/16 S/P CABG x3 with chest left open.  Chest successfully closed am of 10/26/16. - Continue ASA, statin,  b-blocker. ACE added 2. Severe aortic stenosis: 11/302017 S/P AVR bioprostheic  3. Mitral regurgitation: Partial flail leaflet with MV repair.  Developed SAM initially after repair and posterior annuloplasty ring had to be removed.  SAM resolved. 4. Acute on chronic systolic CHF:  TEE at time of chest closure showed EF 40-45%, small cavity size, flattened/hypokinetic septum, s/p MV repair with mild MR, mild to moderately decreased RV systolic function, thrombus in RA. Repeat echo 12/7 with EF 60-65%.  Weaned off off milrinone and phenylephrine.  - Volume status much improved. CVP 8-9 . Off lasix and getting free H2O due to hypernatremia.  5. ID: ?Source of fever - Currently on po vanc and IV flagyl for C difficile.   6. Elevated LFTs: Likely shock liver.   - Resolved 7. Heme: HIT negative, suspect DIC. Platelet count has recovered.  - warfarin for RA thrombus and atrial fibrillation => INR 1.8, off heparin now.   8. VDRF: CCM following, S/p tracheostomy am of 11/09/16. Doing well on trach collar 9. Atrial fibrillation/flutter: Currently sinus   - Continue couamdin and po amiodarone.  - INR 1.8 can dercease ASA to 81 when INR > 2.0 10. PAD: Finger gangrene, mottled toes.  Unable to doppler PT pulses or get TBIs. - Possibly was source of his fever.  - Vascular has seen. Will need amputation of most digits at PIP joints.  11. Hypernatremia - Off lasix. Continue free H2O started.   Continues to make slow but steady progress. Will need [facility] at some  point if he qualifies.   Glori Bickers, MD 11/15/2016 10:08 AM   Advanced Heart Failure Team Pager [phone] (M-F; 7a - 4p)  Please contact [facility] for night-coverage after hours (4p -7a ) and weekends on [email or web address]

## 2016-11-15 NOTE — Evaluation (Signed)
Physical Therapy Evaluation Patient Details Name: Scott [name] MRN: [id number] DOB: November 15, 1946 Today's Date: 11/15/2016   History of Present Illness  70 yo former smoker with known CAD and ascending aorta aneurysm, who was taken to OR 11/30 for cabg x 3, aortic root replacement, MVR replacement, thoracic cavity left open and had extended pump time in OR.  Intubated 11/30-12/18 s/p Trach.  Positive for c-diff, also now with ichemic digits bilat UE & LE.  Clinical Impression  Patient presents with significant dependencies for mobility with multiple medical issues.  Currently total care and with limited ROM/strength in all extremities with pain.  Feel he will benefit from skilled PT in the acute setting to allow decreased burden of care.  Recommend LTACH for continuing therapies as medical issues further addressed.     Follow Up Recommendations LTACH    Equipment Recommendations  Other (comment) (TBA)    Recommendations for Other Services       Precautions / Restrictions Precautions Precautions: Fall Precaution Comments: trach, cortrack, rectal tube      Mobility  Bed Mobility               General bed mobility comments: NT was up in chair via lift per RN  Transfers                 General transfer comment: NT up with lift to chair with nursing  Ambulation/Gait                Stairs            Wheelchair Mobility    Modified Rankin (Stroke Patients Only)       Balance                                             Pertinent Vitals/Pain Pain Assessment: Faces Faces Pain Scale: Hurts whole lot Pain Location: with movements at knees, hands Pain Descriptors / Indicators: Grimacing;Discomfort Pain Intervention(s): Monitored during session;Repositioned;Limited activity within patient's tolerance    Home Living Family/patient expects to be discharged to:: Private residence Living Arrangements: Spouse/significant other                     Prior Function                 Hand Dominance        Extremity/Trunk Assessment   Upper Extremity Assessment Upper Extremity Assessment: RUE deficits/detail;LUE deficits/detail RUE Deficits / Details: ischemic fingers, thumb without signs of ischemia, unable to extend fingers, moves elbow AAROM within about 10 degrees of full extension, strength gorssly 4-/5 elbows, shoulder flexion 3/5 LUE Deficits / Details: ischemic fingers, thumb and hand with necrosis, elbow flex ext AAROM WFL, strength grossly 3/5, shoulder flexion limited with pain to about 85     Lower Extremity Assessment Lower Extremity Assessment: LLE deficits/detail;RLE deficits/detail RLE Deficits / Details: edema present throughout LE's, limited knee flexion about 60 AAROM with pain, unable to lift antigravity, noted weeping lower legs wrapped with dressings and incontinence pads LLE Deficits / Details: edema present throughout LE's, limited knee flexion about 60 AAROM with pain, unable to lift antigravity, noted weeping lower legs wrapped with dressings and incontinence pad       Communication   Communication: Tracheostomy  Cognition Arousal/Alertness: Awake/alert Behavior During Therapy: WFL for tasks assessed/performed Overall Cognitive Status: Difficult to  assess                      General Comments      Exercises General Exercises - Upper Extremity Shoulder Flexion: AAROM;Both;5 reps;Seated Elbow Flexion: AAROM;Both;Seated;5 reps Elbow Extension: AAROM;Both;Seated;5 reps General Exercises - Lower Extremity Ankle Circles/Pumps: AAROM;Both;5 reps;Seated Heel Slides: AAROM;Both;5 reps;Seated Hip ABduction/ADduction: AAROM;Both;5 reps;Seated   Assessment/Plan    PT Assessment Patient needs continued PT services  PT Problem List Decreased strength;Decreased activity tolerance;Decreased mobility;Decreased range of motion;Pain;Decreased knowledge of use of DME;Decreased  skin integrity          PT Treatment Interventions DME instruction;Gait training;Functional mobility training;Therapeutic exercise;Patient/family education;Therapeutic activities;Balance training    PT Goals (Current goals can be found in the Care Plan section)  Acute Rehab PT Goals Patient Stated Goal: Unable to state PT Goal Formulation: With patient Time For Goal Achievement: 11/29/16 Potential to Achieve Goals: Fair    Frequency Min 3X/week   Barriers to discharge        Co-evaluation               End of Session   Activity Tolerance: Patient limited by pain Patient left: in chair;with call bell/phone within reach Nurse Communication: Mobility status         Time: [id number] PT Time Calculation (min) (ACUTE ONLY): 21 min   Charges:   PT Evaluation $PT Eval High Complexity: 1 Procedure     PT G CodesReginia Naas December 04, 2016, 4:20 PM Magda Kiel, [place] Dec 04, 2016

## 2016-11-15 NOTE — Progress Notes (Addendum)
[address]       [place] [address]             [phone]        CARDIOTHORACIC SURGERY PROGRESS NOTE  R24 Days Post-Op Procedure(s) (LRB): CORONARY ARTERY BYPASS GRAFTING (CABG)x 2 WITH LIMA TO DIAGONAL, OPEN HARVESTING OF RIGHT SAPHENOUS VEIN FOR VEIN GRAFT TO LAD (N/A) AORTIC VALVE REPLACEMENT (AVR) WITH SIZE 25 MM MAGNA EASE PERICARDIAL BIOPROSTHESIS - AORTIC (N/A) TRANSESOPHAGEAL ECHOCARDIOGRAM (TEE) (N/A) MITRAL VALVE REPAIR (MVR) WITH SIZE 30 SORIN ANNULOFLEX ANNULOPLASTY RING WITH SUBSEQUENT REMOVAL OF RING (N/A) ASCENDING AORTIC ANEURYSM REPAIR (AAA) WITH 28 MM HEMASHIELD PLATINUM WOVEN DOUBLE VELOUR VASCULAR GRAFT   R6 Days Post-Op Procedure(s) (LRB): TRACHEOSTOMY (N/A) VIDEO BRONCHOSCOPY (N/A)  Subjective: Awake and alert on T collar.  Denies pain.  Breathing comfortably  Objective: Vital signs: BP Readings from Last 1 Encounters:  11/15/16 140/88   Pulse Readings from Last 1 Encounters:  11/15/16 (!) 108   Resp Readings from Last 1 Encounters:  11/15/16 14   Temp Readings from Last 1 Encounters:  11/15/16 98.2 F (36.8 C) (Core (Comment))    Hemodynamics: CVP:  [7 mmHg-9 mmHg] 9 mmHg  Physical Exam:  Rhythm:   Sinus tach  Breath sounds: Coarse but clear, moderate secretions  Heart sounds:  RRR w/ soft systolic murmur along sternal border  Incisions:  Sternal incision looks good.  Dry eschar right thigh - stable  Abdomen:  Soft, non-distended, non-tender  Extremities:  Warm, adequately perfused, no significant change in appearance of fingers and toes   Intake/Output from previous day: 12/23 0701 - 12/24 0700 In: 2484.4 [I.V.:304.4; NG/GT:2180] Out: 1990 [Urine:1590; Stool:400] Intake/Output this shift: Total I/O In: 65 [I.V.:10; NG/GT:55] Out: 100 [Urine:100]  Lab Results:  CBC: Recent Labs  11/14/16 0300 11/15/16 0415  WBC 7.4 10.5  HGB 9.4* 10.0*  HCT 34.1* 36.2*  PLT 190 209    BMET:  Recent Labs  11/14/16 0300 11/15/16 0415  NA 152* 149*  K 4.5 4.1  CL 112* 111  CO2 35* 30  GLUCOSE 147* 131*  BUN 33* 34*  CREATININE 0.75 0.71  CALCIUM 8.4* 8.6*     PT/INR:   Recent Labs  11/15/16 0415  LABPROT 20.7*  INR 1.76    CBG (last 3)   Recent Labs  11/14/16 1940 11/15/16 0011 11/15/16 0317  GLUCAP 153* 165* 104*    ABG    Component Value Date/Time   PHART 7.437 11/10/2016 0340   PCO2ART 45.6 11/10/2016 0340   PO2ART 100 11/10/2016 0340   HCO3 29.9 (H) 11/10/2016 0340   TCO2 36 11/04/2016 1950   ACIDBASEDEF 1.0 10/27/2016 1125   O2SAT 97.3 11/10/2016 0340    CXR: n/a  Assessment/Plan:  Slowly improving Tolerating T-collar trials, resting on vent at night Oxygenation good, most recent CXR clear Maintaining NSR - sinus tach w/ stable BP - BP trending up as sedation decreased Tolerating full support tube feeds Fevers down, essentially afebrile w/ occasional low-grade fever - WBC normal off antibiotics Completing 14 day course Vancomycin via tube for C diff enterocolitis - clinically doing well Hypernatremia starting to improve w/ added free water, diuretics on hold I/O's slightly positive yesterday but weight down 2 lbs - looks euvolemic - dry Appearance of surgical incisions stable - improved Appearance of fingers and toes stable - improved Severe generalized deconditioning   Increase metoprolol  Start ACE-I  Continue Amiodarone and Coumadin  PT/OT  T-collar  trials as tolerated  Continue aggressive nutritional support  Monitor wounds, fingers and toes  Consider D/C to [facility] later this week  Rexene Alberts, MD 11/15/2016 9:29 AM

## 2016-11-16 ENCOUNTER — Inpatient Hospital Stay (HOSPITAL_COMMUNITY): Payer: Medicare Other

## 2016-11-16 LAB — GLUCOSE, CAPILLARY
GLUCOSE-CAPILLARY: 138 mg/dL — AB (ref 65–99)
GLUCOSE-CAPILLARY: 142 mg/dL — AB (ref 65–99)
GLUCOSE-CAPILLARY: 44 mg/dL — AB (ref 65–99)
Glucose-Capillary: 152 mg/dL — ABNORMAL HIGH (ref 65–99)
Glucose-Capillary: 79 mg/dL (ref 65–99)
Glucose-Capillary: 140 mg/dL — ABNORMAL HIGH (ref 65–99)
Glucose-Capillary: 103 mg/dL — ABNORMAL HIGH (ref 65–99)

## 2016-11-16 LAB — PREALBUMIN: PREALBUMIN: 10.8 mg/dL — AB (ref 18–38)

## 2016-11-16 LAB — COMPREHENSIVE METABOLIC PANEL
ALBUMIN: 2.1 g/dL — AB (ref 3.5–5.0)
ALT: 33 U/L (ref 17–63)
ANION GAP: 3 — AB (ref 5–15)
AST: 50 U/L — AB (ref 15–41)
Alkaline Phosphatase: 107 U/L (ref 38–126)
BUN: 30 mg/dL — ABNORMAL HIGH (ref 6–20)
CHLORIDE: 112 mmol/L — AB (ref 101–111)
CO2: 33 mmol/L — ABNORMAL HIGH (ref 22–32)
Calcium: 8.6 mg/dL — ABNORMAL LOW (ref 8.9–10.3)
Creatinine, Ser: 0.72 mg/dL (ref 0.61–1.24)
GFR calc Af Amer: >60 mL/min (ref >60)
GFR calc non Af Amer: >60 mL/min (ref >60)
GLUCOSE: 145 mg/dL — AB (ref 65–99)
POTASSIUM: 4.4 mmol/L (ref 3.5–5.1)
Sodium: 148 mmol/L — ABNORMAL HIGH (ref 135–145)
TOTAL PROTEIN: 6.6 g/dL (ref 6.5–8.1)
Total Bilirubin: 1 mg/dL (ref 0.3–1.2)

## 2016-11-16 LAB — PROTIME-INR
INR: 1.86
Prothrombin Time: 21.7 seconds — ABNORMAL HIGH (ref 11.4–15.2)

## 2016-11-16 LAB — CBC
HEMATOCRIT: 36 % — AB (ref 39.0–52.0)
HEMOGLOBIN: 10.1 g/dL — AB (ref 13.0–17.0)
MCH: 27.6 pg (ref 26.0–34.0)
MCHC: 28.1 g/dL — ABNORMAL LOW (ref 30.0–36.0)
MCV: 98.4 fL (ref 78.0–100.0)
Platelets: 239 10*3/uL (ref 150–400)
RBC: 3.66 MIL/uL — AB (ref 4.22–5.81)
RDW: 17.8 % — AB (ref 11.5–15.5)
WBC: 10.1 10*3/uL (ref 4.0–10.5)

## 2016-11-16 LAB — BRAIN NATRIURETIC PEPTIDE: B NATRIURETIC PEPTIDE 5: 567 pg/mL — AB (ref 0.0–100.0)

## 2016-11-16 MED ORDER — WARFARIN SODIUM 2.5 MG PO TABS
4.0000 mg | ORAL_TABLET | Freq: Once | ORAL | Status: AC
  Administered 2016-11-16: 17:00:00 4 mg via ORAL
  Filled 2016-11-16: qty 0.5

## 2016-11-16 MED ORDER — ASPIRIN 81 MG PO CHEW
81.0000 mg | CHEWABLE_TABLET | Freq: Every day | ORAL | Status: DC
  Administered 2016-11-16 – 2016-11-18 (×3): 81 mg
  Filled 2016-11-16 (×3): qty 1

## 2016-11-16 MED ORDER — LISINOPRIL 20 MG PO TABS
20.0000 mg | ORAL_TABLET | Freq: Every day | ORAL | Status: DC
  Administered 2016-11-16: 20 mg
  Filled 2016-11-16: qty 1

## 2016-11-16 MED ORDER — ASPIRIN EC 81 MG PO TBEC: 81.0000 mg | DELAYED_RELEASE_TABLET | Freq: Every day | ORAL | Status: DC

## 2016-11-16 NOTE — Progress Notes (Signed)
ANTICOAGULATION CONSULT NOTE - Follow Up Consult  Pharmacy Consult for Coumadin Indication: atrial fibrillation and RA clot  Patient Measurements: Height: 5\' 9"  (175.3 cm) Weight: 221 lb 12.5 oz (100.6 kg) IBW/kg (Calculated) : 70.7  Vital Signs: Temp: 98.2 F (36.8 C) (12/25 0815) Temp Source: Core (Comment) (12/25 0400) BP: 141/93 (12/25 0900) Pulse Rate: 110 (12/25 1133)  Labs:  Recent Labs  11/14/16 0300 11/15/16 0415 11/16/16 0346  HGB 9.4* 10.0* 10.1*  HCT 34.1* 36.2* 36.0*  PLT 190 209 239  LABPROT 21.3* 20.7* 21.7*  INR 1.82 1.76 1.86  CREATININE 0.75 0.71 0.72    Estimated Creatinine Clearance: 100.5 mL/min (by C-G formula based on SCr of 0.72 mg/dL).  Assessment:  70yom on previously on heparin for possible right atrial clot and afib. HIT ruled out (low heparin antibody and SRA negative).  Received Coumadin 2 mg x 1 on 12/19 then held 12/20 with INR of 2.6 (no true baseline), and heparin stopped on 12/20.  INR dropped to 1.81 on 12/21 and remained 1.82 after Coumadin 2 mg x 2 days. Coumadin increased to 3 mg on 12/23 and 4 mg on 12/24.  INR has now trended up to 1.86. Hgb stable, platelet count trended up.  ASA decreased to 81 mg today. On Amiodarone.   Goal of Therapy:  INR 2-3 Monitor platelets by anticoagulation protocol: Yes   Plan:   Coumadin 4 mg x 1 again today.  Daily PT/INR.  Arty Baumgartner, [place] Pager: [phone] 11/16/2016,11:36 AM

## 2016-11-16 NOTE — Progress Notes (Signed)
[address]       [place] [address]             [phone]        CARDIOTHORACIC SURGERY PROGRESS NOTE  R25Days Post-Op Procedure(s) (LRB): CORONARY ARTERY BYPASS GRAFTING (CABG)x 2 WITH LIMA TO DIAGONAL, OPEN HARVESTING OF RIGHT SAPHENOUS VEIN FOR VEIN GRAFT TO LAD (N/A) AORTIC VALVE REPLACEMENT (AVR) WITH SIZE 25 MM MAGNA EASE PERICARDIAL BIOPROSTHESIS - AORTIC (N/A) TRANSESOPHAGEAL ECHOCARDIOGRAM (TEE) (N/A) MITRAL VALVE REPAIR (MVR) WITH SIZE 30 SORIN ANNULOFLEX ANNULOPLASTY RING WITH SUBSEQUENT REMOVAL OF RING (N/A) ASCENDING AORTIC ANEURYSM REPAIR (AAA) WITH 28 MM HEMASHIELD PLATINUM WOVEN DOUBLE VELOUR VASCULAR GRAFT   R7 Days Post-Op Procedure(s) (LRB): TRACHEOSTOMY (N/A) VIDEO BRONCHOSCOPY (N/A)  Subjective: Awake and alert.  Comfortable.  Had a good day yesterday on T-collar x12 hours, up in chair.  Slept well last night  Objective: Vital signs: BP Readings from Last 1 Encounters:  11/16/16 (!) 139/95   Pulse Readings from Last 1 Encounters:  11/16/16 (!) 108   Resp Readings from Last 1 Encounters:  11/16/16 (!) 21   Temp Readings from Last 1 Encounters:  11/16/16 98.4 F (36.9 C)    Hemodynamics: CVP:  [6 mmHg-10 mmHg] 9 mmHg  Physical Exam:  Rhythm:   Sinus tach  Breath sounds: Coarse but clear  Heart sounds:  RRR w/ soft systolic murmur LSB  Incisions:  Sternal incision looks good. Dry eschar right thigh - stable  Abdomen:  Soft, non-distended, non-tender, tolerating tube feeds, loose stool  Extremities:  Warm, adequately perfused, no significant change in appearance of fingers and toes   Intake/Output from previous day: 12/24 0701 - 12/25 0700 In: 1990 [I.V.:240; NG/GT:1750] Out: 2575 [Urine:2300; Stool:275] Intake/Output this shift: No intake/output data recorded.  Lab Results:  CBC: Recent Labs  11/15/16 0415 11/16/16 0346  WBC 10.5 10.1  HGB 10.0* 10.1*  HCT 36.2* 36.0*  PLT 209 239    BMET:    Recent Labs  11/15/16 0415 11/16/16 0346  NA 149* 148*  K 4.1 4.4  CL 111 112*  CO2 30 33*  GLUCOSE 131* 145*  BUN 34* 30*  CREATININE 0.71 0.72  CALCIUM 8.6* 8.6*     PT/INR:   Recent Labs  11/16/16 0346  LABPROT 21.7*  INR 1.86    CBG (last 3)   Recent Labs  11/15/16 2049 11/16/16 0108 11/16/16 0327  GLUCAP 159* 138* 152*    ABG    Component Value Date/Time   PHART 7.437 11/10/2016 0340   PCO2ART 45.6 11/10/2016 0340   PO2ART 100 11/10/2016 0340   HCO3 29.9 (H) 11/10/2016 0340   TCO2 36 11/04/2016 1950   ACIDBASEDEF 1.0 10/27/2016 1125   O2SAT 97.3 11/10/2016 0340    CXR: PORTABLE CHEST 1 VIEW  COMPARISON:  11/14/2016  FINDINGS: Sternotomy wires unchanged. Tracheostomy tube in adequate position. Enteric tube courses through the stomach and off the inferior portion of the film. The left-sided PICC line unchanged.  Lungs are adequately inflated with persistent hazy airspace density in the left base which may be due to infection versus small effusion with atelectasis. Right lung is clear. Mild stable cardiomegaly. Remainder of the exam is unchanged.  IMPRESSION: Persistent left base opacification which may be due to infection versus small effusion with atelectasis.  Tubes and lines as described.   Electronically Signed   By: Marin Olp M.D.   On: 11/16/2016 07:34  Assessment/Plan:  Slowly improving Tolerating  T-collar trials, resting on vent at night Oxygenation good and CXR clear Maintaining NSR - sinus tach w/ stable BP - BP trending up as sedation decreased Tolerating full support tube feeds Fevers down, intermittent low-grade fever - WBC normal off antibiotics Completing 14 day course Vancomycin via tube for C diff enterocolitis - clinically doing well Hypernatremia slowly improving w/ added free water, diuretics on hold I/O's slightly negative yesterday and CVP low but weight reportedly up 3 lbs - looks euvolemic -  dry Appearance of surgical incisions stable - improved Appearance of fingers and toes stable - improved Severe generalized deconditioning   Continue metoprolol and increase lisinopril  Continue Amiodarone and Coumadin  Decrease ASA to 81 mg/day  PT/OT  T-collar trials as tolerated  Continue aggressive nutritional support  Monitor wounds, fingers and toes  Consider D/C to [facility] later this week  Rexene Alberts, MD 11/16/2016 8:26 AM

## 2016-11-16 NOTE — Progress Notes (Signed)
Patient ID: Scott [name], male   DOB: [birth year], 70 y.o.   MRN: [id number]     Advanced Heart Failure Rounding Note  Referring Physician: Dr Scott [name] Primary Physician: Dr Scott [name] Primary Cardiologist:  Dr Scott [name]   Reason for Consultation: Heart Failure   Subjective:    Admitted with chest pain. RHC/LHC with 3 vessel disease. Taken to the OR 11/30 for cabg x 3, repair of thoracic ascending aneurysm, AVR bioprosthetic,  MVR, and open chest with VAC placement.  Prolonged OR time.  Chest successfully closed 10/26/16.   S/p tracheostomy 11/09/16.   Off inotropes. On trach collar. Lasix on hold due to hypernatremia.     Sodium 152-> 149 -> 148 with free H2O  On warfarin INR 1.9 .   ID following. Remains on oral vanc and IV flagyl currently for c.diff. Now afebrile.    Studies:  TEE 10/26/16 EF 40-45%, small cavity size, flattened/hypokinetic septum, s/p MV repair with mild MR, mild to moderately decreased RV systolic function. + RA thrombus.   Limited echo 10/29/16 LVEF 60-65%, unable to comment on AVR structure or function with poor windows.   Upper and lower extremity doppler studies: No DVTs  ABIs (12/8): Unable to doppler PTs, or get TBIs.   Objective:   Weight Range: 100.6 kg (221 lb 12.5 oz) Body mass index is 32.75 kg/m.   Vital Signs:   Temp:  [98.4 F (36.9 C)-101.1 F (38.4 C)] 98.4 F (36.9 C) (12/25 0700) Pulse Rate:  [108-127] 108 (12/25 0700) Resp:  [11-25] 21 (12/25 0700) BP: (129-162)/(81-108) 139/95 (12/25 0700) SpO2:  [87 %-100 %] 100 % (12/25 0700) FiO2 (%):  [35 %-40 %] 40 % (12/25 0450) Weight:  [100.6 kg (221 lb 12.5 oz)] 100.6 kg (221 lb 12.5 oz) (12/25 0332) Last BM Date: 11/15/16  Weight change: Filed Weights   11/14/16 0400 11/15/16 0457 11/16/16 0332  Weight: 99.8 kg (220 lb 0.3 oz) 98.9 kg (218 lb 0.6 oz) 100.6 kg (221 lb 12.5 oz)    Intake/Output:   Intake/Output Summary (Last 24 hours) at 11/16/16 0806 Last data filed at 11/16/16  0700  Gross per 24 hour  Intake             1925 ml  Output             2475 ml  Net             -550 ml     Physical Exam: CVP 9 General: s/p Tracheostomy. NAD.  HEENT: + Trach Neck: supple. JVP 8-9. Carotids 2+ bilat; no bruits. No thyromegaly or nodule noted.    Cor: PMI nondisplaced. Mildly tachy, regular.  No M/G/R noted. Lungs: clear anteriorly Abdomen: soft, NT, ND, no HSM. No bruits or masses. +BS  Extremities: no clubbing, rash. Tr edema Gangrenous changes to fingers and toes. Unable to doppler pedal pulses.  Neuro: Trachestomy in place. GU: Foley and rectal tube in place  Telemetry: Reviewed, sinus tachy ~ 100-110 bpm    Labs: CBC  Recent Labs  11/15/16 0415 11/16/16 0346  WBC 10.5 10.1  HGB 10.0* 10.1*  HCT 36.2* 36.0*  MCV 100.8* 98.4  PLT 209 032   Basic Metabolic Panel  Recent Labs  11/14/16 0300 11/15/16 0415 11/16/16 0346  NA 152* 149* 148*  K 4.5 4.1 4.4  CL 112* 111 112*  CO2 35* 30 33*  GLUCOSE 147* 131* 145*  BUN 33* 34* 30*  CREATININE 0.75 0.71 0.72  CALCIUM 8.4* 8.6*  8.6*  MG 2.3  --   --   PHOS 3.2  --   --    Liver Function Tests  Recent Labs  11/16/16 0346  AST 50*  ALT 33  ALKPHOS 107  BILITOT 1.0  PROT 6.6  ALBUMIN 2.1*   No results for input(s): LIPASE, AMYLASE in the last 72 hours. Cardiac Enzymes No results for input(s): CKTOTAL, CKMB, CKMBINDEX, TROPONINI in the last 72 hours.  BNP: BNP (last 3 results)  Recent Labs  11/16/16 0347  BNP 567.0*    ProBNP (last 3 results) No results for input(s): PROBNP in the last 8760 hours.   D-Dimer No results for input(s): DDIMER in the last 72 hours. Hemoglobin A1C No results for input(s): HGBA1C in the last 72 hours. Fasting Lipid Panel No results for input(s): CHOL, HDL, LDLCALC, TRIG, CHOLHDL, LDLDIRECT in the last 72 hours. Thyroid Function Tests No results for input(s): TSH, T4TOTAL, T3FREE, THYROIDAB in the last 72 hours.  Invalid input(s):  FREET3  Other results:  Imaging/Studies:  Dg Chest Port 1 View  Result Date: 11/16/2016 CLINICAL DATA:  Atelectasis and tracheostomy. EXAM: PORTABLE CHEST 1 VIEW COMPARISON:  11/14/2016 FINDINGS: Sternotomy wires unchanged. Tracheostomy tube in adequate position. Enteric tube courses through the stomach and off the inferior portion of the film. The left-sided PICC line unchanged. Lungs are adequately inflated with persistent hazy airspace density in the left base which may be due to infection versus small effusion with atelectasis. Right lung is clear. Mild stable cardiomegaly. Remainder of the exam is unchanged. IMPRESSION: Persistent left base opacification which may be due to infection versus small effusion with atelectasis. Tubes and lines as described. Electronically Signed   By: Scott [name] M.D.   On: 11/16/2016 07:34    Medications:     Scheduled Medications: . allopurinol  300 mg Per Tube Daily  . amiodarone  200 mg Per Tube BID  . aspirin EC  325 mg Oral Daily   Or  . aspirin  324 mg Per Tube Daily  . atorvastatin  40 mg Per Tube q1800  . chlorhexidine gluconate (MEDLINE KIT)  15 mL Mouth Rinse BID  . famotidine  20 mg Per Tube Q12H  . feeding supplement (PRO-STAT SUGAR FREE 64)  30 mL Per Tube BID  . fentaNYL  50 mcg Transdermal Q72H  . free water  400 mL Per Tube Q6H  . insulin aspart  2-6 Units Subcutaneous Q4H  . insulin glargine  10 Units Subcutaneous BID  . lisinopril  10 mg Per Tube Daily  . mouth rinse  15 mL Mouth Rinse QID  . metoprolol tartrate  25 mg Per Tube BID  . potassium chloride  40 mEq Per Tube BID  . rOPINIRole  2 mg Per Tube BID  . sodium chloride flush  3 mL Intravenous Q12H  . vancomycin  500 mg Per Tube Q6H  . Warfarin - Pharmacist Dosing Inpatient   Does not apply q1800    Infusions: . sodium chloride 10 mL/hr at 11/16/16 0700  . feeding supplement (VITAL HIGH PROTEIN) 1,000 mL (11/16/16 0700)    PRN Medications: acetaminophen (TYLENOL)  oral liquid 160 mg/5 mL, fentaNYL (SUBLIMAZE) injection, Gerhardt's butt cream, ipratropium-albuterol, metoprolol, midazolam, morphine injection, ondansetron (ZOFRAN) IV, phenol, sodium chloride flush   Assessment/Plan   Mr Scott [name] is 70 year old admitted with CP. Complex operation this admission.  CABG x 3 + bioprosthetic AVR for severe bicuspid AS + ascending aorta replacement + MV repair for  flail P2.  MV repair complicated by severe SAM, ended up having to remove posterior annuloplasty band.  Chest closed on 12/3.   1. CAD:  10/22/16 S/P CABG x3 with chest left open.  Chest successfully closed am of 10/26/16. - Continue ASA, statin, b-blocker. ACE added 2. Severe aortic stenosis: 11/302017 S/P AVR bioprostheic  3. Mitral regurgitation: Partial flail leaflet with MV repair.  Developed SAM initially after repair and posterior annuloplasty ring had to be removed.  SAM resolved.  4. Acute on chronic systolic/diastolic CHF:  TEE at time of chest closure showed EF 40-45%, small cavity size, flattened/hypokinetic septum, s/p MV repair with mild MR, mild to moderately decreased RV systolic function, thrombus in RA. Repeat echo 12/7 with EF 60-65%.  Weaned off off milrinone and phenylephrine.  - Volume status much improved. CVP 8-9 . Off lasix and getting free H2O due to hypernatremia.  5. C. Diff colitis - Currently on po vanc 6. Elevated LFTs: Likely shock liver.   - Resolved 7. Heme: HIT negative, suspect DIC. Platelet count has recovered.  - warfarin for RA thrombus and atrial fibrillation => INR 1.8, off heparin now.   8. VDRF:  --S/p tracheostomy am of 11/09/16. Doing well on trach collar 9. Atrial fibrillation/flutter: Currently sinus   - Continue couamdin and po amiodarone.  - INR 1.8 can dercease ASA to 81 when INR > 2.0 10. PAD: Finger gangrene, mottled toes.  Unable to doppler PT pulses or get TBIs. - Vascular has seen. Will need amputation of most digits at PIP joints.  11.  Hypernatremia - Off lasix. Continue free H2O started.  12. HTN - Lisinopril and metoprolol increased 12/24. Can titrate as needed.  Continues to make slow but steady progress. Will need [facility] at some point if he qualifies. PT following.   Glori Bickers, MD 11/16/2016 8:06 AM   Advanced Heart Failure Team Pager [phone] (M-F; [place])  Please contact [facility] for night-coverage after hours (4p -7a ) and weekends on [email or web address]

## 2016-11-16 NOTE — Progress Notes (Signed)
Placed pt on the vent at this time

## 2016-11-17 DIAGNOSIS — Z930 Tracheostomy status: Secondary | ICD-10-CM

## 2016-11-17 DIAGNOSIS — I96 Gangrene, not elsewhere classified: Secondary | ICD-10-CM

## 2016-11-17 LAB — BASIC METABOLIC PANEL
ANION GAP: 5 (ref 5–15)
BUN: 28 mg/dL — AB (ref 6–20)
CALCIUM: 8.6 mg/dL — AB (ref 8.9–10.3)
CO2: 29 mmol/L (ref 22–32)
CREATININE: 0.64 mg/dL (ref 0.61–1.24)
Chloride: 111 mmol/L (ref 101–111)
GFR calc Af Amer: >60 mL/min (ref >60)
GFR calc non Af Amer: >60 mL/min (ref >60)
GLUCOSE: 143 mg/dL — AB (ref 65–99)
Potassium: 3.9 mmol/L (ref 3.5–5.1)
Sodium: 145 mmol/L (ref 135–145)

## 2016-11-17 LAB — GLUCOSE, CAPILLARY
GLUCOSE-CAPILLARY: 133 mg/dL — AB (ref 65–99)
GLUCOSE-CAPILLARY: 87 mg/dL (ref 65–99)
GLUCOSE-CAPILLARY: 92 mg/dL (ref 65–99)
Glucose-Capillary: 118 mg/dL — ABNORMAL HIGH (ref 65–99)
Glucose-Capillary: 120 mg/dL — ABNORMAL HIGH (ref 65–99)
Glucose-Capillary: 151 mg/dL — ABNORMAL HIGH (ref 65–99)
Glucose-Capillary: 161 mg/dL — ABNORMAL HIGH (ref 65–99)

## 2016-11-17 LAB — PROTIME-INR
INR: 2.04
PROTHROMBIN TIME: 23.3 s — AB (ref 11.4–15.2)

## 2016-11-17 LAB — CBC
HCT: 34.3 % — ABNORMAL LOW (ref 39.0–52.0)
Hemoglobin: 9.7 g/dL — ABNORMAL LOW (ref 13.0–17.0)
MCH: 27.6 pg (ref 26.0–34.0)
MCHC: 28.3 g/dL — AB (ref 30.0–36.0)
MCV: 97.7 fL (ref 78.0–100.0)
PLATELETS: 194 10*3/uL (ref 150–400)
RBC: 3.51 MIL/uL — ABNORMAL LOW (ref 4.22–5.81)
RDW: 17.5 % — AB (ref 11.5–15.5)
WBC: 8 10*3/uL (ref 4.0–10.5)

## 2016-11-17 MED ORDER — LISINOPRIL 20 MG PO TABS: 30.0000 mg | ORAL_TABLET | Freq: Every day | ORAL | Status: DC

## 2016-11-17 MED ORDER — METOPROLOL TARTRATE 25 MG/10 ML ORAL SUSPENSION
50.0000 mg | Freq: Two times a day (BID) | ORAL | Status: DC
  Administered 2016-11-17 – 2016-11-18 (×3): 50 mg
  Filled 2016-11-17 (×3): qty 20

## 2016-11-17 MED ORDER — LISINOPRIL 20 MG PO TABS
20.0000 mg | ORAL_TABLET | Freq: Every day | ORAL | Status: DC
Start: 2016-11-17 — End: 2016-11-18
  Administered 2016-11-17 – 2016-11-18 (×2): 20 mg
  Filled 2016-11-17 (×2): qty 1

## 2016-11-17 MED ORDER — FREE WATER
300.0000 mL | Freq: Four times a day (QID) | Status: DC
  Administered 2016-11-17 – 2016-11-18 (×3): 300 mL

## 2016-11-17 MED ORDER — WARFARIN SODIUM 2.5 MG PO TABS
4.0000 mg | ORAL_TABLET | Freq: Once | ORAL | Status: AC
  Administered 2016-11-17: 16:00:00 4 mg via ORAL
  Filled 2016-11-17: qty 0.5

## 2016-11-17 NOTE — Progress Notes (Signed)
ANTICOAGULATION CONSULT NOTE - Follow Up Consult  Pharmacy Consult for Coumadin Indication: atrial fibrillation and RA clot  Patient Measurements: Height: 5\' 9"  (175.3 cm) Weight: 222 lb 7.1 oz (100.9 kg) IBW/kg (Calculated) : 70.7  Vital Signs: Temp: 99.3 F (37.4 C) (12/26 0900) Temp Source: Core (Comment) (12/26 0800) BP: 135/91 (12/26 0900) Pulse Rate: 110 (12/26 0900)  Labs:  Recent Labs  11/15/16 0415 11/16/16 0346 11/17/16 0410  HGB 10.0* 10.1* 9.7*  HCT 36.2* 36.0* 34.3*  PLT 209 239 194  LABPROT 20.7* 21.7* 23.3*  INR 1.76 1.86 2.04  CREATININE 0.71 0.72 0.64    Estimated Creatinine Clearance: 100.6 mL/min (by C-G formula based on SCr of 0.64 mg/dL).  Assessment:  70yom on previously on heparin for possible right atrial clot and afib. HIT ruled out (low heparin antibody and SRA negative).   INR slowly trending up and is therapeutic today at 2.0.  Continues on Amiodarone.   Goal of Therapy:  INR 2-3 Monitor platelets by anticoagulation protocol: Yes   Plan:   Coumadin 4 mg x 1 today.  Daily PT/INR.  Erin Hearing PharmD., BCPS Clinical Pharmacist Pager [phone] 11/17/2016 9:38 AM

## 2016-11-17 NOTE — Progress Notes (Signed)
PCCM Progress Note  ADMISSION DATE:  10/20/2016 CONSULTATION DATE: 10/23/2016 REFERRING MD: Ricard Dillon  CHIEF COMPLAINT:  Chest pain  HISTORY OF PRESENT ILLNESS:   70 yo former smoker with known CAD and ascending aorta aneurysm, who was taken to OR 11/30 for cabg x 3, aortic root replacement, MVR replacement, thoracic cavity left open and had extended pump time in OR.  SUBJECTIVE:  Afebrile On ATc since am  VITAL SIGNS: BP 132/88   Pulse (!) 112   Temp 99.1 F (37.3 C)   Resp (!) 26   Ht 5\' 9"  (1.753 m)   Wt 100.9 kg (222 lb 7.1 oz)   SpO2 100%   BMI 32.85 kg/m   HEMODYNAMICS: CVP:  [5 mmHg-12 mmHg] 9 mmHg  INTAKE / OUTPUT: I/O last 3 completed shifts: In: 3050 [I.V.:360; NG/GT:2690] Out: [id number] [Urine:3215; Emesis/NG output:325; Stool:275]  PHYSICAL EXAMINATION: General: ill appearing male, NAD  Neuro: Awake, following commands  HEENT:  Trach in place, [place]/AT, PERRL, EOM-I and MMM, on TC Cardiovascular: irregular, chest closed Lungs:  No wheeze Abdomen:  Soft, non tender Ext: 1+ edema, extremities cool Skin: Ischemic changes in fingers/toes  b/l  CMP Latest Ref Rng & Units 11/17/2016 11/16/2016 11/15/2016  Glucose 65 - 99 mg/dL 143(H) 145(H) 131(H)  BUN 6 - 20 mg/dL 28(H) 30(H) 34(H)  Creatinine 0.61 - 1.24 mg/dL 0.64 0.72 0.71  Sodium 135 - 145 mmol/L 145 148(H) 149(H)  Potassium 3.5 - 5.1 mmol/L 3.9 4.4 4.1  Chloride 101 - 111 mmol/L 111 112(H) 111  CO2 22 - 32 mmol/L 29 33(H) 30  Calcium 8.9 - 10.3 mg/dL 8.6(L) 8.6(L) 8.6(L)  Total Protein 6.5 - 8.1 g/dL - 6.6 -  Total Bilirubin 0.3 - 1.2 mg/dL - 1.0 -  Alkaline Phos 38 - 126 U/L - 107 -  AST 15 - 41 U/L - 50(H) -  ALT 17 - 63 U/L - 33 -    CBC Latest Ref Rng & Units 11/17/2016 11/16/2016 11/15/2016  WBC 4.0 - 10.5 K/uL 8.0 10.1 10.5  Hemoglobin 13.0 - 17.0 g/dL 9.7(L) 10.1(L) 10.0(L)  Hematocrit 39.0 - 52.0 % 34.3(L) 36.0(L) 36.2(L)  Platelets 150 - 400 K/uL 194 239 209    ABG    Component Value  Date/Time   PHART 7.437 11/10/2016 0340   PCO2ART 45.6 11/10/2016 0340   PO2ART 100 11/10/2016 0340   HCO3 29.9 (H) 11/10/2016 0340   TCO2 36 11/04/2016 1950   ACIDBASEDEF 1.0 10/27/2016 1125   O2SAT 97.3 11/10/2016 0340    CBG (last 3)   Recent Labs  11/17/16 0027 11/17/16 0415 11/17/16 0731  GLUCAP 151* 133* 118*    Imaging: Dg Chest Port 1 View  Result Date: 11/16/2016 CLINICAL DATA:  Atelectasis and tracheostomy. EXAM: PORTABLE CHEST 1 VIEW COMPARISON:  11/14/2016 FINDINGS: Sternotomy wires unchanged. Tracheostomy tube in adequate position. Enteric tube courses through the stomach and off the inferior portion of the film. The left-sided PICC line unchanged. Lungs are adequately inflated with persistent hazy airspace density in the left base which may be due to infection versus small effusion with atelectasis. Right lung is clear. Mild stable cardiomegaly. Remainder of the exam is unchanged. IMPRESSION: Persistent left base opacification which may be due to infection versus small effusion with atelectasis. Tubes and lines as described. Electronically Signed   By: Marin Olp M.D.   On: 11/16/2016 07:34    STUDIES:  12.5 dopplers lowers>>>neg 12/6 dopplers arms >>>neg 12/7 abdo us>>>Cholelithiasis without sonographic evidence for acute  cholecystitis. No biliary dilatation.Increased hepatic echogenicity, suggestive of steatosis. Ascites. 12/7 echo>>>60-65%, rv poor images but appeared wnl  CULTURES: C diff 12/12 >> positive Blood 12/12, 12/16 >>ng Rt thigh wound 12/12 >> neg   ANTIBIOTICS:  Acyclovir 12/10 >>12/23 PO vancomycin 12/12 >> Flagyl 12/13 >>  SIGNIFICANT EVENTS: 11/30 surgery, hypoxia 12/4 closed chest 12/7 high fevers, LFt up 12/24 ATC  LINES/TUBES: 11/30 ETT>>12/18 Trach (CVTS) 12/18>>> 12/04 picc left >>> 11/30 CT x 4>> R CT x 2 12/16>>>  I reviewed CXR myself, trach in good position.  ASSESSMENT / PLAN:  Acute hypoxic respiratory failure  after CABG with MVR. - Continue TC during the day and vent at night (patient continues to decompensate after 12 or so hours).  Patient is weanable. - Speech eval for PM valve if he tolerates (SLP ordered 12/26). - PT evaluation and treatment   CAD s/p CABG, MVR, aortic root replacement. A fib. Rt atrial clot. - PO amiodarone, ASA - Heparin gtt per pharmacy.  B/l hand/feet ischemia. - Will need further therapy when more stable, needs a rehab facility  Hypernatremia: - Free water 300 mg q6 hours. - Hold lasix for now  C diff colitis. - Enteral vancomycin per ID complete - On Acyclovir IV since 12/10 for upper lip lesions --dc'd 10/24  Anemia of critical illness. - F/u CBC - Transfuse per ICU protocol.  DM. - SSI with lantus  Acute metabolic encephalopathy. - RASS goal 0 to -1 - D/C precedex  - D/C Fentanyl drip, use fentanyl patch and prn, RN feels that has been enough so far.  DVT prophylaxis - heparin gtt SUP - Pepcid Nutrition - tube feeds Goals of care - Full code  [facility]wait[facility] bed.  Discussed with Dr. Ricard Dillon.  Rush Farmer, M.D. [facility] Pulmonary/Critical Care Medicine. Pager: [phone]. After hours pager: [phone].  11/17/2016

## 2016-11-17 NOTE — Op Note (Signed)
CARDIOTHORACIC SURGERY OPERATIVE NOTE  Date of Procedure:   11/17/2016  Preoperative Diagnosis:  Skin edge necrosis right thigh wound  Postoperative Diagnosis:  same  Procedure:      Excisional debridement of right thigh wound  Application of wound vacuum assist closure device  Surgeon:    Valentina Gu. Roxy Manns, MD    DETAILS OF THE OPERATIVE PROCEDURE  Patient was left in the supine position in his hospital bed in the intensive care unit. Light intravenous narcotics were administered for analgesia. A #15 blade knife was utilized to excise the dry necrotic skin overlying the right side wound. Beneath the necrotic skin there was no sign of ongoing infection or purulent drainage. The wound was cleansed with saline and a wound VAC applied. The wound was relatively shallow, measuring only 1 cm in its greatest depth. Total dimensions of the wound were 13 x 4 x 1 cm. The wound VAC was connected to continuous suction. The procedure was tolerated well.     Valentina Gu. Roxy Manns MD 11/17/2016 10:09 AM

## 2016-11-17 NOTE — Progress Notes (Signed)
      [address]       [place] [address]             [phone]      Resting quietly  BP 136/86   Pulse (!) 110   Temp 99.9 F (37.7 C)   Resp (!) 30   Ht 5\' 9"  (1.753 m)   Wt 222 lb 7.1 oz (100.9 kg)   SpO2 100%   BMI 32.85 kg/m    Intake/Output Summary (Last 24 hours) at 11/17/16 1722 Last data filed at 11/17/16 1700  Gross per 24 hour  Intake             2630 ml  Output             2205 ml  Net              425 ml    Stable day  To go to [place]. Roxan Hockey, MD [facility] [phone]

## 2016-11-17 NOTE — Progress Notes (Signed)
Dr. Roxy Manns made aware bed available in [facility]; per MD request pt to transfer tomorrow given time of day; wife called to make aware; will cont. To monitor.  Scott [name]

## 2016-11-17 NOTE — Consult Note (Signed)
WOC re-consulted for toes, fingers and right thigh wound.   Noted Dr. Roxy Manns has debrided the right thigh wound at the bedside and placed NPWT dressing. With orders for T/Th/Saturday dressing changes.  Was not consulted for this dressing change, bedside nurse ok to change dressings for NPWT.  No acute changes in the fingers and toes, they appear stable.  Orders in place for the open areas of the LE wounds.    Dr. Sharol Given has consulted for toes and fingers and stated that the patient will need amputation of the fingers of the hands and the right thumb at some point.  The does are darkened but not gangrenous at this time.   Wound not recommend any topical care of the toes.  Can begin painting the fingers and thumb with betadine daily for antibacterial and drying effects.  I would also suggest asking Dr. Sharol Given to re-evaluate the patient at some point prior to transfer to [facility] to make sure any further surgical intervention is not needed.   [facility] Nurse team will follow along with you for weekly wound assessments.  Please notify me of any acute changes in the wounds or any new areas of concerns [place] MSN, [place], CNS [phone]

## 2016-11-17 NOTE — Progress Notes (Signed)
Nutrition Follow Up  DOCUMENTATION CODES:   Obesity unspecified  INTERVENTION:    Continue Vital High Protein at goal rate of 55 ml/h with Prostat 30 ml BID to provide 1520 kcals, 145 gm protein, 1103 ml free water daily  NUTRITION DIAGNOSIS:   Inadequate oral intake related to inability to eat as evidenced by NPO status, ongoing  GOAL:    Provide needs based on ASPEN/SCCM guidelines, met   MONITOR:   TF tolerance, Vent status, Labs, Weight trends, Skin, I & O's  ASSESSMENT:   70 yo former smoker with known CAD and ascending aorta aneurysm, who was taken to OR 11/30 for cabg x 3, aortic root replacement, MVR replacement, thoracic cavity left open and had extended pump time in OR. On APRV ventilation post op, CI 1.6 and multiple pressors. PCCM asked to manage vent.   Pt s/p procedures 11/30: AORTIC VALVE REPLACEMENT MITRAL VALVE REPAIR REPAIR OF ASCENDING THORACIC AORTIC ANEURYSM CORONARY ARTERY BYPASS GRAFTING x 3  Patient is currently on ventilator support (nocturnal) >> trach Temp (24hrs), Avg:99.4 F (37.4 C), Min:98.8 F (37.1 C), Max:99.9 F (37.7 C)  Vital High Protein currently infusing at goal rate of 55 ml/hr via CORTRAK small bore feeding tube. S/p excisional debridement of R thigh wound & application of wound VAC today. Speech Path consulted for swallow evaluation. Free water flushes at 300 ml every 6 hours. CBG's [id number].  Diet Order:  Diet NPO time specified  Skin:  Wound (see comment) (R thigh wound VAC)  Last BM:  12/26  Height:  Ht Readings from Last 1 Encounters:  10/23/16 '5\' 9"'  (1.753 m)    Weight: >>> fluctuating but stable  Wt Readings from Last 1 Encounters:  11/17/16 222 lb 7.1 oz (100.9 kg)    Ideal Body Weight:  72.7 kg  BMI:  Body mass index is 32.85 kg/m.  Estimated Nutritional Needs:   Kcal:  3893-0684  Protein:  145-155 gm  Fluid:  per MD  EDUCATION NEEDS:   No education needs identified at this  time  Arthur Holms, RD, LDN Pager #: [phone] After-Hours Pager #: [phone]

## 2016-11-17 NOTE — Care Management Note (Signed)
Case Management Note  Patient Details  Name: ANDR… BRADING MRN: [id number] Date of Birth: 1946/01/10  Subjective/Objective:  Pt medically stable for discharge to [facility] today, per MD.  Family prefers [facility] for [facility].                    Action/Plan: Notified admissions liaison with [facility] that pt is stable to go to LTAC today.  Bed available.  Nurse to call to have provider complete discharge summary.  Admissions liaison to follow up with nurse on room # and will call wife for consent to admit to [facility] hospital.    Expected Discharge Date:    11/17/16              Expected Discharge Plan:  Long Term Acute Care (LTAC)  In-House Referral:     Discharge planning Services  CM Consult  Post Acute Care Choice:    Choice offered to:     DME Arranged:    DME Agency:     HH Arranged:    [place] Agency:     Status of Service:  Completed, signed off  If discussed at [employer], dates discussed:    Additional Comments:  Ella Bodo, RN 11/17/2016, 4:11 PM

## 2016-11-17 NOTE — Progress Notes (Addendum)
[facility] DAILY ICU PROGRESS NOTE                   [address]            [place] [address]          [phone]   8 Days Post-Op Procedure(s) (LRB): TRACHEOSTOMY (N/A) VIDEO BRONCHOSCOPY (N/A)  Total Length of Stay:  LOS: 28 days   Subjective: Feeling better  Objective: Vital signs in last 24 hours: Temp:  [98.4 F (36.9 C)-99.7 F (37.6 C)] 99.3 F (37.4 C) (12/26 0800) Pulse Rate:  [109-115] 112 (12/26 0800) Cardiac Rhythm: Sinus tachycardia (12/26 0800) Resp:  [5-29] 17 (12/26 0800) BP: (116-165)/(81-106) 126/85 (12/26 0800) SpO2:  [98 %-100 %] 100 % (12/26 0800) FiO2 (%):  [28 %-40 %] 40 % (12/26 0800) Weight:  [222 lb 7.1 oz (100.9 kg)] 222 lb 7.1 oz (100.9 kg) (12/26 0443)  Filed Weights   11/15/16 0457 11/16/16 0332 11/17/16 0443  Weight: 218 lb 0.6 oz (98.9 kg) 221 lb 12.5 oz (100.6 kg) 222 lb 7.1 oz (100.9 kg)    Weight change: 10.6 oz (0.3 kg)   Hemodynamic parameters for last 24 hours: CVP:  [5 mmHg-12 mmHg] 9 mmHg  Intake/Output from previous day: 12/25 0701 - 12/26 0700 In: 2240 [I.V.:240; NG/GT:2000] Out: 2315 [Urine:1990; Emesis/NG output:325]  Intake/Output this shift: Total I/O In: 465 [I.V.:10; NG/GT:455] Out: 125 [Urine:125]  Vent Mode: PRVC FiO2 (%):  [28 %-40 %] 40 % Set Rate:  [12 bmp] 12 bmp Vt Set:  [570 mL] 570 mL PEEP:  [5 cmH20] 5 cmH20 Plateau Pressure:  [14 cmH20-17 cmH20] 17 cmH20  Current Meds: Scheduled Meds: . allopurinol  300 mg Per Tube Daily  . amiodarone  200 mg Per Tube BID  . aspirin EC  81 mg Oral Daily   Or  . aspirin  81 mg Per Tube Daily  . atorvastatin  40 mg Per Tube q1800  . chlorhexidine gluconate (MEDLINE KIT)  15 mL Mouth Rinse BID  . famotidine  20 mg Per Tube Q12H  . feeding supplement (PRO-STAT SUGAR FREE 64)  30 mL Per Tube BID  . fentaNYL  50 mcg Transdermal Q72H  . free water  400 mL Per Tube Q6H  . insulin aspart  2-6 Units Subcutaneous Q4H  . insulin glargine  10 Units  Subcutaneous BID  . lisinopril  20 mg Per Tube Daily  . mouth rinse  15 mL Mouth Rinse QID  . metoprolol tartrate  25 mg Per Tube BID  . rOPINIRole  2 mg Per Tube BID  . sodium chloride flush  3 mL Intravenous Q12H  . vancomycin  500 mg Per Tube Q6H  . Warfarin - Pharmacist Dosing Inpatient   Does not apply q1800   Continuous Infusions: . sodium chloride 10 mL/hr at 11/17/16 0800  . feeding supplement (VITAL HIGH PROTEIN) 1,000 mL (11/17/16 0800)   PRN Meds:.acetaminophen (TYLENOL) oral liquid 160 mg/5 mL, fentaNYL (SUBLIMAZE) injection, Gerhardt's butt cream, ipratropium-albuterol, metoprolol, midazolam, morphine injection, ondansetron (ZOFRAN) IV, phenol, sodium chloride flush  General appearance: alert, cooperative and no distress Heart: regular rate and rhythm and soft syst murmur Lungs: clear anteriorly Abdomen: soft, non tender, nondistended Extremities: + edema Wound: hands and feet stable in appearance  Lab Results: CBC: Recent Labs  11/16/16 0346 11/17/16 0410  WBC 10.1 8.0  HGB 10.1* 9.7*  HCT 36.0* 34.3*  PLT 239 194   BMET:  Recent Labs  11/16/16 0346 11/17/16 0410  NA 148* 145  K 4.4 3.9  CL 112* 111  CO2 33* 29  GLUCOSE 145* 143*  BUN 30* 28*  CREATININE 0.72 0.64  CALCIUM 8.6* 8.6*    CMET: Lab Results  Component Value Date   WBC 8.0 11/17/2016   HGB 9.7 (L) 11/17/2016   HCT 34.3 (L) 11/17/2016   PLT 194 11/17/2016   GLUCOSE 143 (H) 11/17/2016   CHOL 134 10/21/2016   TRIG 203 (H) 10/21/2016   HDL 28 (L) 10/21/2016   LDLCALC 65 10/21/2016   ALT 33 11/16/2016   AST 50 (H) 11/16/2016   NA 145 11/17/2016   K 3.9 11/17/2016   CL 111 11/17/2016   CREATININE 0.64 11/17/2016   BUN 28 (H) 11/17/2016   CO2 29 11/17/2016   TSH 1.93 10/19/2016   INR 2.04 11/17/2016   HGBA1C 6.6 (H) 10/21/2016    PT/INR:  Recent Labs  11/17/16 0410  LABPROT 23.3*  INR 2.04   Radiology: No results found.   Assessment/Plan: S/P Procedure(s)  (LRB): TRACHEOSTOMY (N/A) VIDEO BRONCHOSCOPY (N/A)  1 conts to slowly improve 2 TC trials cont  Per CCM 3 volume status conts to improve, AHF team assisting with management of CHF(combined syst./ diastolic). May requir 4 sinus tachy- poss increase beta blocker a little. Also increase lisinopril for better HTN control. On amiodarone for prev AFIB 5 no leukocytosis, tmax 99.7- conts vanco per tube to complete 14 day cdiff RX 6 H/H pretty stable- follow 7 follow extrem exam- currently stable in appearance 8 consider change from insulin to glucophage soon 9 pharmacy dosing coumadin 10 sodium, in normal range with free H2O replacement, cont TF's 11 placement to LTAC possibly soon GOLD,WAYNE E 11/17/2016 8:16 AM   I have seen and examined the patient and agree with the assessment and plan as outlined.  Ready for d/c to [facility] for further rehab and care.  Will plan to locally debride necrotic skin right thigh wound - may need VAC.  Appearance of fingers and toes continues to slowly improve - continue to monitor.  Check f/u ECHO  Clarence H Owen, MD 11/17/2016 9:13 AM    

## 2016-11-17 NOTE — Progress Notes (Signed)
Patient ID: Scott [name], male   DOB: July 14, 1946, 70 y.o.   MRN: [id number]     Advanced Heart Failure Rounding Note  Referring Physician: Dr Roxy Manns Primary Physician: Dr Quintin Alto Primary Cardiologist:  Dr Percival Spanish   Reason for Consultation: Heart Failure   Subjective:    Admitted with chest pain. RHC/LHC with 3 vessel disease. Taken to the OR 11/30 for cabg x 3, repair of thoracic ascending aneurysm, AVR bioprosthetic,  MVR, and open chest with VAC placement.  Prolonged OR time.  Chest successfully closed 10/26/16.   S/p tracheostomy 11/09/16.   Off inotropes. On trach collar. Lasix on hold due to hypernatremia.     Sodium 152-> 149 -> 148 -> 145 with free H2O. CVP 5  Responsive. Trach in place. Denies pain or SOB currently.   On Coumadin. INR 2.04  ID following. Remains on oral vanc and IV flagyl currently for c.diff. Remains afebrile.    Studies:  TEE 10/26/16 EF 40-45%, small cavity size, flattened/hypokinetic septum, s/p MV repair with mild MR, mild to moderately decreased RV systolic function. + RA thrombus.   Limited echo 10/29/16 LVEF 60-65%, unable to comment on AVR structure or function with poor windows.   Upper and lower extremity doppler studies: No DVTs  ABIs (12/8): Unable to doppler PTs, or get TBIs.   Objective:   Weight Range: 222 lb 7.1 oz (100.9 kg) Body mass index is 32.85 kg/m.   Vital Signs:   Temp:  [98.4 F (36.9 C)-99.7 F (37.6 C)] 99.5 F (37.5 C) (12/26 0830) Pulse Rate:  [109-115] 110 (12/26 0830) Resp:  [5-29] 18 (12/26 0830) BP: (116-165)/(81-106) 126/85 (12/26 0800) SpO2:  [98 %-100 %] 100 % (12/26 0830) FiO2 (%):  [28 %-40 %] 40 % (12/26 0800) Weight:  [222 lb 7.1 oz (100.9 kg)] 222 lb 7.1 oz (100.9 kg) (12/26 0443) Last BM Date: 11/17/16  Weight change: Filed Weights   11/15/16 0457 11/16/16 0332 11/17/16 0443  Weight: 218 lb 0.6 oz (98.9 kg) 221 lb 12.5 oz (100.6 kg) 222 lb 7.1 oz (100.9 kg)     Intake/Output:   Intake/Output Summary (Last 24 hours) at 11/17/16 0849 Last data filed at 11/17/16 0800  Gross per 24 hour  Intake             2640 ml  Output             2440 ml  Net              200 ml     Physical Exam: CVP 5 General: s/p Tracheostomy. NAD.  HEENT: + Trach Neck: supple. JVP does not appear elevated.Carotids 2+ bilat; no bruits. No thyromegaly or nodule noted.    Cor: PMI nondisplaced. Somewhat tach, Regular. No M/G/R appreciated.  Lungs: Clear anteriorly.  Abdomen: soft, NT, ND, no HSM. No bruits or masses. +BS  Extremities: no clubbing, rash. 1+ dependent edema in thighs. Gangrene fingers and toes. Unable to doppler pedal pulses.  Neuro: Alert to person GU: Foley and rectal tube in place  Telemetry: Reviewed, Sinus tach 100-110s     Labs: CBC  Recent Labs  11/16/16 0346 11/17/16 0410  WBC 10.1 8.0  HGB 10.1* 9.7*  HCT 36.0* 34.3*  MCV 98.4 97.7  PLT 239 191   Basic Metabolic Panel  Recent Labs  11/16/16 0346 11/17/16 0410  NA 148* 145  K 4.4 3.9  CL 112* 111  CO2 33* 29  GLUCOSE 145* 143*  BUN 30* 28*  CREATININE 0.72 0.64  CALCIUM 8.6* 8.6*   Liver Function Tests  Recent Labs  11/16/16 0346  AST 50*  ALT 33  ALKPHOS 107  BILITOT 1.0  PROT 6.6  ALBUMIN 2.1*   No results for input(s): LIPASE, AMYLASE in the last 72 hours. Cardiac Enzymes No results for input(s): CKTOTAL, CKMB, CKMBINDEX, TROPONINI in the last 72 hours.  BNP: BNP (last 3 results)  Recent Labs  11/16/16 0347  BNP 567.0*    ProBNP (last 3 results) No results for input(s): PROBNP in the last 8760 hours.   D-Dimer No results for input(s): DDIMER in the last 72 hours. Hemoglobin A1C No results for input(s): HGBA1C in the last 72 hours. Fasting Lipid Panel No results for input(s): CHOL, HDL, LDLCALC, TRIG, CHOLHDL, LDLDIRECT in the last 72 hours. Thyroid Function Tests No results for input(s): TSH, T4TOTAL, T3FREE, THYROIDAB in the last 72  hours.  Invalid input(s): FREET3  Other results:  Imaging/Studies:  Dg Chest Port 1 View  Result Date: 11/16/2016 CLINICAL DATA:  Atelectasis and tracheostomy. EXAM: PORTABLE CHEST 1 VIEW COMPARISON:  11/14/2016 FINDINGS: Sternotomy wires unchanged. Tracheostomy tube in adequate position. Enteric tube courses through the stomach and off the inferior portion of the film. The left-sided PICC line unchanged. Lungs are adequately inflated with persistent hazy airspace density in the left base which may be due to infection versus small effusion with atelectasis. Right lung is clear. Mild stable cardiomegaly. Remainder of the exam is unchanged. IMPRESSION: Persistent left base opacification which may be due to infection versus small effusion with atelectasis. Tubes and lines as described. Electronically Signed   By: Marin Olp M.D.   On: 11/16/2016 07:34    Medications:     Scheduled Medications: . allopurinol  300 mg Per Tube Daily  . amiodarone  200 mg Per Tube BID  . aspirin EC  81 mg Oral Daily   Or  . aspirin  81 mg Per Tube Daily  . atorvastatin  40 mg Per Tube q1800  . chlorhexidine gluconate (MEDLINE KIT)  15 mL Mouth Rinse BID  . famotidine  20 mg Per Tube Q12H  . feeding supplement (PRO-STAT SUGAR FREE 64)  30 mL Per Tube BID  . fentaNYL  50 mcg Transdermal Q72H  . free water  400 mL Per Tube Q6H  . insulin aspart  2-6 Units Subcutaneous Q4H  . insulin glargine  10 Units Subcutaneous BID  . lisinopril  30 mg Per Tube Daily  . mouth rinse  15 mL Mouth Rinse QID  . metoprolol tartrate  25 mg Per Tube BID  . rOPINIRole  2 mg Per Tube BID  . sodium chloride flush  3 mL Intravenous Q12H  . vancomycin  500 mg Per Tube Q6H  . Warfarin - Pharmacist Dosing Inpatient   Does not apply q1800    Infusions: . sodium chloride 10 mL/hr at 11/17/16 0800  . feeding supplement (VITAL HIGH PROTEIN) 1,000 mL (11/17/16 0800)    PRN Medications: acetaminophen (TYLENOL) oral liquid 160  mg/5 mL, fentaNYL (SUBLIMAZE) injection, Gerhardt's butt cream, ipratropium-albuterol, metoprolol, midazolam, morphine injection, ondansetron (ZOFRAN) IV, phenol, sodium chloride flush   Assessment/Plan   Mr Wale is 70 year old admitted with CP. Complex operation this admission.  CABG x 3 + bioprosthetic AVR for severe bicuspid AS + ascending aorta replacement + MV repair for flail P2.  MV repair complicated by severe SAM, ended up having to remove posterior annuloplasty band.  Chest closed on 12/3.  1. CAD:  10/22/16 S/P CABG x3 with chest left open.  Chest successfully closed am of 10/26/16. - Continue ASA, statin, b-blocker. ACE added 2. Severe aortic stenosis: 11/302017 S/P AVR bioprostheic  3. Mitral regurgitation: Partial flail leaflet with MV repair.  Developed SAM initially after repair and posterior annuloplasty ring had to be removed.  SAM resolved.  4. Acute on chronic systolic/diastolic CHF:  TEE at time of chest closure showed EF 40-45%, small cavity size, flattened/hypokinetic septum, s/p MV repair with mild MR, mild to moderately decreased RV systolic function, thrombus in RA. Repeat echo 12/7 with EF 60-65%.  Weaned off off milrinone and phenylephrine.  - Volume status stable. Getting free H2O due to Hypernatremia. CVP ~5 this am. Will need to start po lasix at some point.  5. C. Diff colitis - Currently on po vanc 6. Elevated LFTs: Likely shock liver.   - Resolved 7. Heme: HIT negative, suspect DIC. Platelet count has recovered.  - warfarin for RA thrombus and atrial fibrillation => INR 2.0, off heparin now.   8. VDRF:  --S/p tracheostomy am of 11/09/16. Doing well on trach collar 9. Atrial fibrillation/flutter: Currently sinus   - Continue couamdin and po amiodarone.  - INR 2.04. Decrease ASA to 81 10. PAD: Finger gangrene, mottled toes.  Unable to doppler PT pulses or get TBIs. - Vascular has seen. Will need amputation of most digits at PIP joints.  11.  Hypernatremia - Off lasix. Continue free H2O.   12. HTN - Continue Lisinopril 30 mg daily and metoprolol 25 BID.  Continues to make slow progress.  Considering LTACH per PT.    Scott Voeltz, PA-C 11/17/2016 8:49 AM   Advanced Heart Failure Team Pager [phone] (M-F; 7a - 4p)  Please contact [facility] for night-coverage after hours (4p -7a ) and weekends on [email or web address]   Patient seen and examined with Oda Kilts, PA-C. We discussed all aspects of the encounter. I agree with the assessment and plan as stated above.   Continues to improved slowly. Hemodynamically stable. Continue current regimen. Await [facility] eval.   Bensimhon, Daniel,MD 10:16 AM

## 2016-11-18 ENCOUNTER — Inpatient Hospital Stay (HOSPITAL_COMMUNITY): Payer: Medicare Other

## 2016-11-18 ENCOUNTER — Inpatient Hospital Stay
Admission: AD | Admit: 2016-11-18 | Discharge: 2017-02-05 | Disposition: A | Discharge status: Rehabilitation Facility | Payer: Medicare Other | Source: Ambulatory Visit | Attending: Internal Medicine | Admitting: Internal Medicine

## 2016-11-18 ENCOUNTER — Other Ambulatory Visit (HOSPITAL_COMMUNITY): Payer: Self-pay

## 2016-11-18 DIAGNOSIS — R5381 Other malaise: Secondary | ICD-10-CM | POA: Diagnosis not present

## 2016-11-18 DIAGNOSIS — D7582 Heparin induced thrombocytopenia (HIT): Secondary | ICD-10-CM

## 2016-11-18 DIAGNOSIS — Z430 Encounter for attention to tracheostomy: Secondary | ICD-10-CM | POA: Diagnosis not present

## 2016-11-18 DIAGNOSIS — Z89511 Acquired absence of right leg below knee: Secondary | ICD-10-CM | POA: Diagnosis not present

## 2016-11-18 DIAGNOSIS — Z880 Allergy status to penicillin: Secondary | ICD-10-CM | POA: Diagnosis not present

## 2016-11-18 DIAGNOSIS — Z4659 Encounter for fitting and adjustment of other gastrointestinal appliance and device: Secondary | ICD-10-CM

## 2016-11-18 DIAGNOSIS — J9601 Acute respiratory failure with hypoxia: Secondary | ICD-10-CM

## 2016-11-18 DIAGNOSIS — J9690 Respiratory failure, unspecified, unspecified whether with hypoxia or hypercapnia: Secondary | ICD-10-CM | POA: Diagnosis not present

## 2016-11-18 DIAGNOSIS — J90 Pleural effusion, not elsewhere classified: Secondary | ICD-10-CM | POA: Diagnosis not present

## 2016-11-18 DIAGNOSIS — N183 Chronic kidney disease, stage 3 (moderate): Secondary | ICD-10-CM | POA: Diagnosis not present

## 2016-11-18 DIAGNOSIS — E46 Unspecified protein-calorie malnutrition: Secondary | ICD-10-CM | POA: Diagnosis not present

## 2016-11-18 DIAGNOSIS — R1310 Dysphagia, unspecified: Secondary | ICD-10-CM | POA: Diagnosis not present

## 2016-11-18 DIAGNOSIS — I96 Gangrene, not elsewhere classified: Secondary | ICD-10-CM

## 2016-11-18 DIAGNOSIS — I5041 Acute combined systolic (congestive) and diastolic (congestive) heart failure: Secondary | ICD-10-CM | POA: Diagnosis not present

## 2016-11-18 DIAGNOSIS — Z452 Encounter for adjustment and management of vascular access device: Secondary | ICD-10-CM | POA: Diagnosis not present

## 2016-11-18 DIAGNOSIS — I739 Peripheral vascular disease, unspecified: Secondary | ICD-10-CM | POA: Diagnosis not present

## 2016-11-18 DIAGNOSIS — Z4682 Encounter for fitting and adjustment of non-vascular catheter: Secondary | ICD-10-CM | POA: Diagnosis not present

## 2016-11-18 DIAGNOSIS — Z87891 Personal history of nicotine dependence: Secondary | ICD-10-CM | POA: Diagnosis not present

## 2016-11-18 DIAGNOSIS — Z9889 Other specified postprocedural states: Secondary | ICD-10-CM

## 2016-11-18 DIAGNOSIS — Z8679 Personal history of other diseases of the circulatory system: Secondary | ICD-10-CM

## 2016-11-18 DIAGNOSIS — E870 Hyperosmolality and hypernatremia: Secondary | ICD-10-CM | POA: Diagnosis present

## 2016-11-18 DIAGNOSIS — T8579XA Infection and inflammatory reaction due to other internal prosthetic devices, implants and grafts, initial encounter: Secondary | ICD-10-CM | POA: Diagnosis not present

## 2016-11-18 DIAGNOSIS — I350 Nonrheumatic aortic (valve) stenosis: Secondary | ICD-10-CM

## 2016-11-18 DIAGNOSIS — Z930 Tracheostomy status: Secondary | ICD-10-CM | POA: Diagnosis not present

## 2016-11-18 DIAGNOSIS — J9600 Acute respiratory failure, unspecified whether with hypoxia or hypercapnia: Secondary | ICD-10-CM | POA: Diagnosis not present

## 2016-11-18 DIAGNOSIS — Z7982 Long term (current) use of aspirin: Secondary | ICD-10-CM | POA: Diagnosis not present

## 2016-11-18 DIAGNOSIS — A0472 Enterocolitis due to Clostridium difficile, not specified as recurrent: Secondary | ICD-10-CM

## 2016-11-18 DIAGNOSIS — Z89512 Acquired absence of left leg below knee: Secondary | ICD-10-CM | POA: Diagnosis not present

## 2016-11-18 DIAGNOSIS — I82603 Acute embolism and thrombosis of unspecified veins of upper extremity, bilateral: Secondary | ICD-10-CM

## 2016-11-18 DIAGNOSIS — G8918 Other acute postprocedural pain: Secondary | ICD-10-CM | POA: Diagnosis present

## 2016-11-18 DIAGNOSIS — Z7984 Long term (current) use of oral hypoglycemic drugs: Secondary | ICD-10-CM | POA: Diagnosis not present

## 2016-11-18 DIAGNOSIS — T8361XA Infection and inflammatory reaction due to implanted penile prosthesis, initial encounter: Secondary | ICD-10-CM | POA: Diagnosis not present

## 2016-11-18 DIAGNOSIS — J811 Chronic pulmonary edema: Secondary | ICD-10-CM

## 2016-11-18 DIAGNOSIS — I471 Supraventricular tachycardia: Secondary | ICD-10-CM | POA: Diagnosis present

## 2016-11-18 DIAGNOSIS — A4902 Methicillin resistant Staphylococcus aureus infection, unspecified site: Secondary | ICD-10-CM | POA: Diagnosis not present

## 2016-11-18 DIAGNOSIS — G8928 Other chronic postprocedural pain: Secondary | ICD-10-CM | POA: Diagnosis present

## 2016-11-18 DIAGNOSIS — Z431 Encounter for attention to gastrostomy: Secondary | ICD-10-CM

## 2016-11-18 DIAGNOSIS — E119 Type 2 diabetes mellitus without complications: Secondary | ICD-10-CM | POA: Diagnosis present

## 2016-11-18 DIAGNOSIS — D65 Disseminated intravascular coagulation [defibrination syndrome]: Secondary | ICD-10-CM

## 2016-11-18 DIAGNOSIS — N179 Acute kidney failure, unspecified: Secondary | ICD-10-CM | POA: Diagnosis not present

## 2016-11-18 DIAGNOSIS — N390 Urinary tract infection, site not specified: Secondary | ICD-10-CM | POA: Diagnosis not present

## 2016-11-18 DIAGNOSIS — I509 Heart failure, unspecified: Secondary | ICD-10-CM | POA: Diagnosis not present

## 2016-11-18 DIAGNOSIS — R0902 Hypoxemia: Secondary | ICD-10-CM

## 2016-11-18 DIAGNOSIS — R000 Tachycardia, unspecified: Secondary | ICD-10-CM | POA: Diagnosis not present

## 2016-11-18 DIAGNOSIS — I5042 Chronic combined systolic (congestive) and diastolic (congestive) heart failure: Secondary | ICD-10-CM | POA: Diagnosis not present

## 2016-11-18 DIAGNOSIS — G9340 Encephalopathy, unspecified: Secondary | ICD-10-CM | POA: Diagnosis not present

## 2016-11-18 DIAGNOSIS — I82601 Acute embolism and thrombosis of unspecified veins of right upper extremity: Secondary | ICD-10-CM | POA: Diagnosis not present

## 2016-11-18 DIAGNOSIS — Z953 Presence of xenogenic heart valve: Secondary | ICD-10-CM | POA: Diagnosis not present

## 2016-11-18 DIAGNOSIS — A4189 Other specified sepsis: Secondary | ICD-10-CM | POA: Diagnosis not present

## 2016-11-18 DIAGNOSIS — M868X4 Other osteomyelitis, hand: Secondary | ICD-10-CM | POA: Diagnosis present

## 2016-11-18 DIAGNOSIS — K929 Disease of digestive system, unspecified: Secondary | ICD-10-CM | POA: Diagnosis not present

## 2016-11-18 DIAGNOSIS — S81802S Unspecified open wound, left lower leg, sequela: Secondary | ICD-10-CM | POA: Diagnosis not present

## 2016-11-18 DIAGNOSIS — T8131XS Disruption of external operation (surgical) wound, not elsewhere classified, sequela: Secondary | ICD-10-CM | POA: Diagnosis not present

## 2016-11-18 DIAGNOSIS — I10 Essential (primary) hypertension: Secondary | ICD-10-CM | POA: Diagnosis not present

## 2016-11-18 DIAGNOSIS — Z9842 Cataract extraction status, left eye: Secondary | ICD-10-CM | POA: Diagnosis not present

## 2016-11-18 DIAGNOSIS — Z931 Gastrostomy status: Secondary | ICD-10-CM

## 2016-11-18 DIAGNOSIS — T83490A Other mechanical complication of penile (implanted) prosthesis, initial encounter: Secondary | ICD-10-CM | POA: Diagnosis not present

## 2016-11-18 DIAGNOSIS — I480 Paroxysmal atrial fibrillation: Secondary | ICD-10-CM | POA: Diagnosis not present

## 2016-11-18 DIAGNOSIS — I70262 Atherosclerosis of native arteries of extremities with gangrene, left leg: Secondary | ICD-10-CM | POA: Diagnosis not present

## 2016-11-18 DIAGNOSIS — K219 Gastro-esophageal reflux disease without esophagitis: Secondary | ICD-10-CM | POA: Diagnosis not present

## 2016-11-18 DIAGNOSIS — D638 Anemia in other chronic diseases classified elsewhere: Secondary | ICD-10-CM | POA: Diagnosis present

## 2016-11-18 DIAGNOSIS — R570 Cardiogenic shock: Secondary | ICD-10-CM | POA: Diagnosis not present

## 2016-11-18 DIAGNOSIS — T8754 Necrosis of amputation stump, left lower extremity: Secondary | ICD-10-CM | POA: Diagnosis not present

## 2016-11-18 DIAGNOSIS — M869 Osteomyelitis, unspecified: Secondary | ICD-10-CM | POA: Diagnosis not present

## 2016-11-18 DIAGNOSIS — T8753 Necrosis of amputation stump, right lower extremity: Secondary | ICD-10-CM | POA: Diagnosis not present

## 2016-11-18 DIAGNOSIS — M86141 Other acute osteomyelitis, right hand: Secondary | ICD-10-CM | POA: Diagnosis not present

## 2016-11-18 DIAGNOSIS — Z79899 Other long term (current) drug therapy: Secondary | ICD-10-CM | POA: Diagnosis not present

## 2016-11-18 DIAGNOSIS — J9621 Acute and chronic respiratory failure with hypoxia: Secondary | ICD-10-CM | POA: Diagnosis not present

## 2016-11-18 DIAGNOSIS — Z9911 Dependence on respirator [ventilator] status: Secondary | ICD-10-CM | POA: Diagnosis not present

## 2016-11-18 DIAGNOSIS — J189 Pneumonia, unspecified organism: Secondary | ICD-10-CM | POA: Diagnosis not present

## 2016-11-18 DIAGNOSIS — M67421 Ganglion, right elbow: Secondary | ICD-10-CM

## 2016-11-18 DIAGNOSIS — Z9841 Cataract extraction status, right eye: Secondary | ICD-10-CM | POA: Diagnosis not present

## 2016-11-18 DIAGNOSIS — T8361XD Infection and inflammatory reaction due to implanted penile prosthesis, subsequent encounter: Secondary | ICD-10-CM | POA: Diagnosis not present

## 2016-11-18 DIAGNOSIS — Z9189 Other specified personal risk factors, not elsewhere classified: Secondary | ICD-10-CM

## 2016-11-18 DIAGNOSIS — I2510 Atherosclerotic heart disease of native coronary artery without angina pectoris: Secondary | ICD-10-CM | POA: Diagnosis not present

## 2016-11-18 DIAGNOSIS — Z951 Presence of aortocoronary bypass graft: Secondary | ICD-10-CM | POA: Diagnosis not present

## 2016-11-18 DIAGNOSIS — I70261 Atherosclerosis of native arteries of extremities with gangrene, right leg: Secondary | ICD-10-CM | POA: Diagnosis not present

## 2016-11-18 DIAGNOSIS — I82602 Acute embolism and thrombosis of unspecified veins of left upper extremity: Secondary | ICD-10-CM | POA: Diagnosis not present

## 2016-11-18 DIAGNOSIS — Z794 Long term (current) use of insulin: Secondary | ICD-10-CM | POA: Diagnosis not present

## 2016-11-18 DIAGNOSIS — Z961 Presence of intraocular lens: Secondary | ICD-10-CM | POA: Diagnosis present

## 2016-11-18 DIAGNOSIS — E1122 Type 2 diabetes mellitus with diabetic chronic kidney disease: Secondary | ICD-10-CM | POA: Diagnosis not present

## 2016-11-18 DIAGNOSIS — D62 Acute posthemorrhagic anemia: Secondary | ICD-10-CM | POA: Diagnosis not present

## 2016-11-18 DIAGNOSIS — Z881 Allergy status to other antibiotic agents status: Secondary | ICD-10-CM | POA: Diagnosis not present

## 2016-11-18 DIAGNOSIS — D75829 Heparin-induced thrombocytopenia, unspecified: Secondary | ICD-10-CM

## 2016-11-18 DIAGNOSIS — Z882 Allergy status to sulfonamides status: Secondary | ICD-10-CM | POA: Diagnosis not present

## 2016-11-18 DIAGNOSIS — R4182 Altered mental status, unspecified: Secondary | ICD-10-CM | POA: Diagnosis not present

## 2016-11-18 DIAGNOSIS — R531 Weakness: Secondary | ICD-10-CM | POA: Diagnosis not present

## 2016-11-18 DIAGNOSIS — R6889 Other general symptoms and signs: Secondary | ICD-10-CM

## 2016-11-18 DIAGNOSIS — I5040 Unspecified combined systolic (congestive) and diastolic (congestive) heart failure: Secondary | ICD-10-CM | POA: Diagnosis present

## 2016-11-18 DIAGNOSIS — I248 Other forms of acute ischemic heart disease: Secondary | ICD-10-CM | POA: Diagnosis not present

## 2016-11-18 DIAGNOSIS — I4892 Unspecified atrial flutter: Secondary | ICD-10-CM | POA: Diagnosis present

## 2016-11-18 DIAGNOSIS — E1165 Type 2 diabetes mellitus with hyperglycemia: Secondary | ICD-10-CM | POA: Diagnosis not present

## 2016-11-18 DIAGNOSIS — Z952 Presence of prosthetic heart valve: Secondary | ICD-10-CM | POA: Diagnosis not present

## 2016-11-18 DIAGNOSIS — D696 Thrombocytopenia, unspecified: Secondary | ICD-10-CM | POA: Diagnosis not present

## 2016-11-18 DIAGNOSIS — M109 Gout, unspecified: Secondary | ICD-10-CM | POA: Diagnosis not present

## 2016-11-18 DIAGNOSIS — I4891 Unspecified atrial fibrillation: Secondary | ICD-10-CM | POA: Diagnosis present

## 2016-11-18 DIAGNOSIS — B965 Pseudomonas (aeruginosa) (mallei) (pseudomallei) as the cause of diseases classified elsewhere: Secondary | ICD-10-CM | POA: Diagnosis not present

## 2016-11-18 LAB — BASIC METABOLIC PANEL
Anion gap: 5 (ref 5–15)
BUN: 27 mg/dL — ABNORMAL HIGH (ref 6–20)
CHLORIDE: 113 mmol/L — AB (ref 101–111)
CO2: 29 mmol/L (ref 22–32)
CREATININE: 0.58 mg/dL — AB (ref 0.61–1.24)
Calcium: 8.2 mg/dL — ABNORMAL LOW (ref 8.9–10.3)
GFR calc non Af Amer: >60 mL/min (ref >60)
Glucose, Bld: 136 mg/dL — ABNORMAL HIGH (ref 65–99)
POTASSIUM: 3.6 mmol/L (ref 3.5–5.1)
Sodium: 147 mmol/L — ABNORMAL HIGH (ref 135–145)

## 2016-11-18 LAB — PHOSPHORUS: PHOSPHORUS: 2.6 mg/dL (ref 2.5–4.6)

## 2016-11-18 LAB — ECHOCARDIOGRAM COMPLETE
Height: 69 in
Weight: 3562.63 oz

## 2016-11-18 LAB — GLUCOSE, CAPILLARY: Glucose-Capillary: 132 mg/dL — ABNORMAL HIGH (ref 65–99)

## 2016-11-18 LAB — CBC
HEMATOCRIT: 28.1 % — AB (ref 39.0–52.0)
Hemoglobin: 8 g/dL — ABNORMAL LOW (ref 13.0–17.0)
MCH: 27.5 pg (ref 26.0–34.0)
MCHC: 28.5 g/dL — ABNORMAL LOW (ref 30.0–36.0)
MCV: 96.6 fL (ref 78.0–100.0)
PLATELETS: 166 10*3/uL (ref 150–400)
RBC: 2.91 MIL/uL — AB (ref 4.22–5.81)
RDW: 17.2 % — ABNORMAL HIGH (ref 11.5–15.5)
WBC: 6.4 10*3/uL (ref 4.0–10.5)

## 2016-11-18 LAB — PROTIME-INR
INR: 2.26
Prothrombin Time: 25.4 seconds — ABNORMAL HIGH (ref 11.4–15.2)

## 2016-11-18 LAB — MAGNESIUM: Magnesium: 2.1 mg/dL (ref 1.7–2.4)

## 2016-11-18 NOTE — Discharge Summary (Signed)
Physician Discharge Summary   Physician Discharge Summary  Patient ID: Scott [name] MRN: [id number] DOB/AGE: 70-Mar-1947 70 y.o.  Admit date: 10/20/2016 Discharge date: 11/18/2016  Admission Diagnoses:Severe aortic stenosis and coronary artery disease  Discharge Diagnoses:  Principal Problem:   S/P aortic valve replacement with bioprosthetic valve Active Problems:   Severe aortic stenosis by prior echocardiography   Unstable angina [facility])   Aortic valve stenosis   Coronary artery disease involving native coronary artery of native heart with unstable angina pectoris (HCC)   Bicuspid aortic valve   Thoracic ascending aortic aneurysm (HCC)   Mitral regurgitation due to cusp prolapse   S/P ascending aortic aneurysm repair   S/P mitral valve repair   Hx of CABG   Cardiogenic shock (HCC)   Postoperative atrial fibrillation (HCC)   Thrombocytopenia (HCC)   Acute combined systolic and diastolic congestive heart failure (HCC)   Acute respiratory failure ([place])   DIC (disseminated intravascular coagulation) (HCC)   Elevated LFTs   Acute encephalopathy   FUO (fever of unknown origin)   Enteritis due to Clostridium difficile   Anasarca   Atherosclerosis of native arteries of extremities with gangrene, left leg (HCC)   Atherosclerosis of native arteries of extremities with gangrene, right leg (HCC)   Acute on chronic diastolic CHF (congestive heart failure) ([place])   Chest tube in place   Tracheostomy status [facility])  Patient Active Problem List   Diagnosis Date Noted  . Tracheostomy status ([place])   . Chest tube in place   . Atherosclerosis of native arteries of extremities with gangrene, left leg ([place])   . Atherosclerosis of native arteries of extremities with gangrene, right leg ([place])   . Acute on chronic diastolic CHF (congestive heart failure) ([place])   . Enteritis due to Clostridium difficile   . Anasarca   . FUO (fever of unknown origin)   . Acute encephalopathy   . Elevated  LFTs   . DIC (disseminated intravascular coagulation) ([place]) 10/28/2016  . Postoperative atrial fibrillation ([place]) 10/24/2016  . Cardiogenic shock ([place])   . Mitral regurgitation due to cusp prolapse 10/22/2016  . S/P aortic valve replacement with bioprosthetic valve 10/22/2016  . S/P ascending aortic aneurysm repair 10/22/2016  . S/P mitral valve repair 10/22/2016  . Hx of CABG 10/22/2016  . Thrombocytopenia ([place]) 10/22/2016  . Acute combined systolic and diastolic congestive heart failure ([place]) 10/22/2016  . Acute respiratory failure ([place]) 10/22/2016  . Thoracic ascending aortic aneurysm ([place]) 10/21/2016  . Coronary artery disease involving native coronary artery of native heart with unstable angina pectoris ([place]) 10/20/2016  . Severe aortic stenosis by prior echocardiography 10/19/2016  . Unstable angina ([place]) 10/19/2016  . Aortic valve stenosis 10/19/2016  . Bicuspid aortic valve 10/19/2016  . Trigger ring finger of right hand   . Wears glasses   . Gout   . Hypertension   . Carpal tunnel syndrome of right wrist   . Arthritis   . Snores      HPI: at time of consultation  Patient is a 70 year old male with no previous history of coronary artery disease but known history of heart murmur and risk factors notable for history of type 2 diabetes mellitus, hyperlipidemia, and remote history of tobacco use who has been referred for surgical consultation to discuss management of severe symptomatic aortic stenosis and coronary artery disease with unstable angina pectoris. The patient states he was in his usual state of health until approximately 2 months ago when he first began to  experience symptoms of exertional chest pain and shortness of breath. Symptoms initially seemed to happen when he bent over at the waist, but he rapidly noticed increasing symptoms of substernal chest pain and shortness of breath that were brought on with physical exertion and relieved by rest. Symptoms progressed in  frequency and severity over the last 2 months. Symptoms are described as midsternal pain which the patient initially attributed to intense indigestion. He denies any prolonged episodes of chest pain and reports that symptoms are usually relieved within a few minutes of rest. He denies any associated symptoms of shortness of breath in the absence of ongoing chest discomfort. He denies any symptoms of chest pain occurring at rest. He denies any nocturnal angina. He denies any history of palpitations, dizzy spells, or syncope. He was referred for cardiology consultation and evaluated in the office yesterday by Dr. Percival Spanish. Transthoracic echocardiogram revealed functionally bicuspid aortic valve with severe aortic stenosis. Left ventricular systolic function remained preserved. The patient was scheduled for diagnostic cardiac catheterization performed earlier today by Dr. Ellyn Hack. The patient was found to have severe aortic stenosis with severe multivessel coronary artery disease including critical 99% stenosis of the mid left anterior descending coronary artery. The patient was admitted to the hospital and urgent or thoracic surgical consultation requested.  The patient is married and lives with his wife in [place], [place].  They have 3 grown children and numerous grandchildren. 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  The patient has remained physically active during retirement and reports no significant physical limitations up until recently.  Discharged Condition: stable  Hospital Course: Due to the patient's increasing symptoms he was felt to require admission for cardiac catheterization. This was performed on 10/20/2016 by Dr. Ellyn Hack. Due to the severity of his anatomical disease and severe/critical aortic stenosis his best option was felt to be CABG with AVR. Cardiothoracic surgical consultation was  obtained with Dr. Darylene Price. He evaluated the studies and agree with recommendations to proceed with surgical intervention. The procedure was scheduled and on 10/22/2016 the patient was taken the operating room where he underwent the below described procedure. The procedure was quite difficult and he developed a severe intraoperative coagulopathy. He was taken to the surgical intensive care unit in stable but critical condition.  Postoperative hospital course:  The patient has had a prolonged and difficult postoperative course. He required multiple pressor and inotropic agents initially and also required nitric oxide support. The critical care medicine service was consult with to assist with ventilator management as well as critical care issues. Currently he is on trach collar trials after extensive efforts to wean and tracheostomy.. Additionally the advanced heart failure team was also consulted to assist with complex management issues. He continued to have some early issues related to postoperative coagulopathy requiring aggressive replacement therapy. There was some initial presumption that the patient had HITT however it was later felt that the severe coagulopathy was more of a issue related to DIC. He was treated with Angiomax for a term. He developed atrial fibrillation and was placed on intravenous amiodarone. This had to be stopped for a short-term due to elevated transaminases felt primarily a result of hepatic congestion due to shock which resolved over time. On 10/26/2016 he was taken back to the operating room for closure of his sternum. He has remained ventilator dependent and ultimately did require tracheostomy. During the early course of this  hospitalization the patient developed severe ischemic extremities and has developed necrotic portions of bilateral hands and feet. These findings have stabilized and he is demarcating with ultimate plans for some level of amputation, yet to be determined.  The patient also developed fevers during the early portion of hospitalization and infectious disease assisted with management to cover for possible endocarditis. Additionally, he did develop a C. difficile colitis and has had treatment with vancomycin. Ultimately he was able to be weaned from all inotropic support. He required aggressive diuresis over multiple days including Lasix drip. He did develop some hypernatremia and free water was substituted. He did have a significant postoperative delirium which was multifactorial which is slowly resolving. He moves extremities and follows commands. Over the most recent several days he has become much more alert and sedation has been significantly decreased over time. The patient is currently on Coumadin. The patient did develop skin edge necrosis of his right thigh wound requiring debridement and placement of a VAC on 11/17/2016 by Dr. Roxy Manns. Currently his condition is felt to be stable but he will require ongoing prolonged management clinic rehabilitation modalities in an [place] environment. He is felt to be stable for transfer to [facility] on today's date.  Consults: cardiology, pulmonary/intensive care, ID and orthopedic surgery  Significant Diagnostic Studies: angiography: cardiac cath, echo/TEE, CT angio/chest  Treatments: surgery: CARDIOTHORACIC SURGERY OPERATIVE NOTE  Date of Procedure:                10/22/2016  Preoperative Diagnosis:        Bicuspid Aortic Valve with Severe Aortic Stenosis  Multivessel Coronary Artery Disease  Unstable Angina Pectoris  Ascending Thoracic Aortic Aneurysm  Postoperative Diagnosis:      Bicuspid Aortic Valve with Severe Aortic Stenosis  Multivessel Coronary Artery Disease  Unstable Angina Pectoris  Ascending Thoracic Aortic Aneurysm  Mitral Valve Prolapse with Moderate-Severe Mitral Regurgitation  Severe left ventricular hypertrophy with asymmetric septal hypertrophy   Procedure:        Aortic  Valve Replacement             Edwards Magna Ease Pericardial Tissue Valve (size 5m, model # 3300TFX, serial # [id number]             Septal myomectomy   Mitral Valve Repair             Complex valvuloplasty including artificial Gore-tex neochord placement x6             Sorin Carbomedics Annuloflex posterior annuloplasty band placed but subsequently removed due to systolic anterior motion   Repair of Ascending Thoracic Aortic Aneurysm             Supracoronary straight graft repair using 28 mm Hemashield Platinum woven double velour vascular graft   Coronary Artery Bypass Grafting x 3             Sequential Left Internal Mammary Artery to Diagonal Branch Coronary Artery and Distal Left Anterior Descending Coronary Artery             Saphenous Vein Graft to Distal Left Anterior Descending Coronary Artery             Open Vein Harvest from Right Thigh                Surgeon:        CValentina Gu ORoxy Manns MD  Assistant:       DNani Skillern PA-C  Anesthesia:    CLaurie Panda MD and RSuzette Battiest  MD  Operative Findings:  Bicuspid aortic valve with severe aortic stenosis and moderate aortic insufficiency  Fibroelastic deficiency type degenerative disease of mitral valve with ruptured chordae tendineae causing flail segment of posterior leaflet  Type II mitral valve dysfunction with moderate (3+) mitral regurgitation  Fusiform aneurysm of ascending thoracic aorta  Normal LV systolic function  Severe LV hypertrophy with diastolic dysfunction  Asymmetric septal hypertrophy  Good quality LIMA conduit for grafting  Good quality SVG conduit for grafting  Severe systolic anterior motion (SAM) of mitral valve after initial valve repair that failed to resolve despite extensive maneuvers  Resolution of SAM but moderate (3+) residual mitral regurgitation after removal of posterior annuloplasty band   10:21 AM      _0 Hide copied text _1 Hover for attribution  information CARDIOTHORACIC SURGERY OPERATIVE NOTE  Date of Procedure:                            10/26/2016  Preoperative Diagnosis:                  Open chest, s/p AVR, mitral valve repair, CABG x3 and repair of ascending thoracic aortic aneurysm  Postoperative Diagnosis:                same  Procedure:                                         Sternal washout and delayed primary closure  Surgeon:                                            Valentina Gu. Roxy Manns, MD  Assistant:                                           Dineen Kid, CRNFA  Anesthesia:                                        Rica Koyanagi, MD  Operative Findings:   ? Low normal LV systolic function with mild hypokinesis of the anterior septum ? Mild RV chamber enlargement and systolic dysfunction ? Normal functioning bioprosthetic tissue valve in aortic position ? Mild-moderate (1+/2+) mitral regurgitation ? Mobile densities in right atrium suspicious for blood clot        CARDIOTHORACIC SURGERY OPERATIVE NOTE  Date of Procedure:                            11/09/2016  Preoperative Diagnosis:                  Respiratory Failure  Postoperative Diagnosis:                same  Procedure:                                           Bronchoscopy with endobronchial lavage  Tracheostomy  Surgeon:                                            Valentina Gu. Roxy Manns, MD  Assistant:                                           Lilia Argue. Servando Snare, MD  Anesthesia:                                        Laurie Panda, MD   CARDIOTHORACIC SURGERY OPERATIVE NOTE  Date of Procedure:                            11/17/2016  Preoperative Diagnosis:                  Skin edge necrosis right thigh wound  Postoperative Diagnosis:                same  Procedure:                                           Excisional debridement of right thigh wound  Application of wound vacuum assist closure device  Surgeon:                                             Valentina Gu. Roxy Manns, MD      Scheduled Meds: . allopurinol  300 mg Per Tube Daily  . amiodarone  200 mg Per Tube BID  . aspirin EC  81 mg Oral Daily   Or  . aspirin  81 mg Per Tube Daily  . atorvastatin  40 mg Per Tube q1800  . chlorhexidine gluconate (MEDLINE KIT)  15 mL Mouth Rinse BID  . famotidine  20 mg Per Tube Q12H  . feeding supplement (PRO-STAT SUGAR FREE 64)  30 mL Per Tube BID  . fentaNYL  50 mcg Transdermal Q72H  . free water  300 mL Per Tube Q6H  . insulin aspart  2-6 Units Subcutaneous Q4H  . insulin glargine  10 Units Subcutaneous BID  . lisinopril  20 mg Per Tube Daily  . mouth rinse  15 mL Mouth Rinse QID  . metoprolol tartrate  50 mg Per Tube BID  . rOPINIRole  2 mg Per Tube BID  . sodium chloride flush  3 mL Intravenous Q12H  . Warfarin - Pharmacist Dosing Inpatient   Does not apply q1800   Continuous Infusions: . sodium chloride 20 mL/hr at 11/18/16 0800  . feeding supplement (VITAL HIGH PROTEIN) Stopped (11/18/16 0745)   PRN Meds:.acetaminophen (TYLENOL) oral liquid 160 mg/5 mL, fentaNYL (SUBLIMAZE) injection, Gerhardt's butt cream, ipratropium-albuterol, metoprolol, midazolam, morphine injection, ondansetron (ZOFRAN) IV, phenol, sodium chloride flush   Discharge Exam: Blood pressure 113/81, pulse (!) 111, temperature 100 F (37.8 C), temperature source Core (Comment), resp. rate 15, height _0  (1.753 m), weight 222 lb 10.6 oz (101  kg), SpO2 100 %.   General appearance: alert, cooperative and no distress Heart: regular rate and rhythm and soft syst murmur Lungs: clear anteriorly Abdomen: soft, non tender, nondistended Extremities: + edema Wound: hands and feet stable in appearance VAC in place   Vent Mode: PRVC FiO2 (%):  [28 %-30 %] 30 % Set Rate:  [12 bmp] 12 bmp Vt Set:  [570 mL] 570 mL PEEP:  [5 cmH20] 5 cmH20 Plateau Pressure:  [14 cmH20-17 cmH20] 17 cmH20 Vent wean management per CCM- TC  trials   Dg Abd 1 View  Result Date: 11/18/2016 CLINICAL DATA:  Evaluate NG tube placement EXAM: ABDOMEN - 1 VIEW COMPARISON:  November 09, 2016 FINDINGS: A feeding tube terminates near the ligament of Treitz. A separate NG tube is not identified. IMPRESSION: A feeding tube terminates near the ligament of Treitz. A separate NG tube is not identified. Electronically Signed   By: Dorise Bullion III M.D   On: 11/18/2016 08:31   Dg Chest Port 1 View  Result Date: 11/16/2016 CLINICAL DATA:  Atelectasis and tracheostomy. EXAM: PORTABLE CHEST 1 VIEW COMPARISON:  11/14/2016 FINDINGS: Sternotomy wires unchanged. Tracheostomy tube in adequate position. Enteric tube courses through the stomach and off the inferior portion of the film. The left-sided PICC line unchanged. Lungs are adequately inflated with persistent hazy airspace density in the left base which may be due to infection versus small effusion with atelectasis. Right lung is clear. Mild stable cardiomegaly. Remainder of the exam is unchanged. IMPRESSION: Persistent left base opacification which may be due to infection versus small effusion with atelectasis. Tubes and lines as described. Electronically Signed   By: Marin Olp M.D.   On: 11/16/2016 07:34     Results for orders placed or performed during the hospital encounter of 10/20/16 (from the past 24 hour(s))  Glucose, capillary     Status: Abnormal   Collection Time: 11/17/16 11:12 AM  Result Value Ref Range   Glucose-Capillary 161 (H) 65 - 99 mg/dL   Comment 1 Venous Specimen   Glucose, capillary     Status: Abnormal   Collection Time: 11/17/16  3:13 PM  Result Value Ref Range   Glucose-Capillary 120 (H) 65 - 99 mg/dL  Glucose, capillary     Status: None   Collection Time: 11/17/16  8:25 PM  Result Value Ref Range   Glucose-Capillary 87 65 - 99 mg/dL   Comment 1 Venous Specimen   Glucose, capillary     Status: None   Collection Time: 11/17/16 11:58 PM  Result Value Ref Range    Glucose-Capillary 92 65 - 99 mg/dL   Comment 1 Venous Specimen   CBC     Status: Abnormal   Collection Time: 11/18/16  5:20 AM  Result Value Ref Range   WBC 6.4 4.0 - 10.5 K/uL   RBC 2.91 (L) 4.22 - 5.81 MIL/uL   Hemoglobin 8.0 (L) 13.0 - 17.0 g/dL   HCT 28.1 (L) 39.0 - 52.0 %   MCV 96.6 78.0 - 100.0 fL   MCH 27.5 26.0 - 34.0 pg   MCHC 28.5 (L) 30.0 - 36.0 g/dL   RDW 17.2 (H) 11.5 - 15.5 %   Platelets 166 150 - 400 K/uL  Protime-INR     Status: Abnormal   Collection Time: 11/18/16  5:20 AM  Result Value Ref Range   Prothrombin Time 25.4 (H) 11.4 - 15.2 seconds   INR 7.41   Basic metabolic panel     Status: Abnormal  Collection Time: 11/18/16  6:40 AM  Result Value Ref Range   Sodium 147 (H) 135 - 145 mmol/L   Potassium 3.6 3.5 - 5.1 mmol/L   Chloride 113 (H) 101 - 111 mmol/L   CO2 29 22 - 32 mmol/L   Glucose, Bld 136 (H) 65 - 99 mg/dL   BUN 27 (H) 6 - 20 mg/dL   Creatinine, Ser 0.58 (L) 0.61 - 1.24 mg/dL   Calcium 8.2 (L) 8.9 - 10.3 mg/dL   GFR calc non Af Amer >60 >60 mL/min   GFR calc Af Amer >60 >60 mL/min   Anion gap 5 5 - 15  Magnesium     Status: None   Collection Time: 11/18/16  6:40 AM  Result Value Ref Range   Magnesium 2.1 1.7 - 2.4 mg/dL  Phosphorus     Status: None   Collection Time: 11/18/16  6:40 AM  Result Value Ref Range   Phosphorus 2.6 2.5 - 4.6 mg/dL     [facility]: [facility]      Signed: GOLD,WAYNE E 11/18/2016, 8:26 AM

## 2016-11-18 NOTE — Progress Notes (Signed)
Patient ID: Scott [name], male   DOB: Apr 08, 1946, 70 y.o.   MRN: [id number]     Advanced Heart Failure Rounding Note  Referring Physician: Dr Roxy Manns Primary Physician: Dr Quintin Alto Primary Cardiologist:  Dr Percival Spanish   Reason for Consultation: Heart Failure   Subjective:    Stable. CVP 7-8. Underwent wound vac to R thigh yesterday. No complaints  Echo at bedside this am LV 60-65% RV ok. No MR   [facility].  Studies:  TEE 10/26/16 EF 40-45%, small cavity size, flattened/hypokinetic septum, s/p MV repair with mild MR, mild to moderately decreased RV systolic function. + RA thrombus.   Limited echo 10/29/16 LVEF 60-65%, unable to comment on AVR structure or function with poor windows.   Upper and lower extremity doppler studies: No DVTs  ABIs (12/8): Unable to doppler PTs, or get TBIs.   Objective:   Weight Range: 101 kg (222 lb 10.6 oz) Body mass index is 32.88 kg/m.   Vital Signs:   Temp:  [99.1 F (37.3 C)-100.4 F (38 C)] 99.7 F (37.6 C) (12/27 0900) Pulse Rate:  [108-216] 112 (12/27 0900) Resp:  [13-30] 14 (12/27 0900) BP: (106-161)/(78-117) 116/78 (12/27 0900) SpO2:  [97 %-100 %] 100 % (12/27 0900) FiO2 (%):  [28 %-30 %] 30 % (12/27 0900) Weight:  [101 kg (222 lb 10.6 oz)] 101 kg (222 lb 10.6 oz) (12/27 0500) Last BM Date: 11/18/16  Weight change: Filed Weights   11/16/16 0332 11/17/16 0443 11/18/16 0500  Weight: 100.6 kg (221 lb 12.5 oz) 100.9 kg (222 lb 7.1 oz) 101 kg (222 lb 10.6 oz)    Intake/Output:   Intake/Output Summary (Last 24 hours) at 11/18/16 0956 Last data filed at 11/18/16 6712  Gross per 24 hour  Intake             2655 ml  Output             2030 ml  Net              625 ml     Physical Exam: CVP 7 General: s/p Tracheostomy. NAD.  HEENT: + Trach Neck: supple. JVP does not appear elevated.Carotids 2+ bilat; no bruits. No thyromegaly or nodule noted.    Cor: PMI nondisplaced. Somewhat tach, Regular. No M/G/R appreciated.    Lungs: Clear anteriorly.  Abdomen: soft, NT, ND, no HSM. No bruits or masses. +BS  Extremities: no clubbing, rash. Trace edema in thighs. Gangrene fingers and toes. Unable to doppler pedal pulses. R thigh wound vac Neuro: Alert to person GU: Foley and rectal tube in place  Telemetry: Reviewed, Sinus tach 100-110s     Labs: CBC  Recent Labs  11/17/16 0410 11/18/16 0520  WBC 8.0 6.4  HGB 9.7* 8.0*  HCT 34.3* 28.1*  MCV 97.7 96.6  PLT 194 458   Basic Metabolic Panel  Recent Labs  11/17/16 0410 11/18/16 0640  NA 145 147*  K 3.9 3.6  CL 111 113*  CO2 29 29  GLUCOSE 143* 136*  BUN 28* 27*  CREATININE 0.64 0.58*  CALCIUM 8.6* 8.2*  MG  --  2.1  PHOS  --  2.6   Liver Function Tests  Recent Labs  11/16/16 0346  AST 50*  ALT 33  ALKPHOS 107  BILITOT 1.0  PROT 6.6  ALBUMIN 2.1*   No results for input(s): LIPASE, AMYLASE in the last 72 hours. Cardiac Enzymes No results for input(s): CKTOTAL, CKMB, CKMBINDEX, TROPONINI in the last 72  hours.  BNP: BNP (last 3 results)  Recent Labs  11/16/16 0347  BNP 567.0*    ProBNP (last 3 results) No results for input(s): PROBNP in the last 8760 hours.   D-Dimer No results for input(s): DDIMER in the last 72 hours. Hemoglobin A1C No results for input(s): HGBA1C in the last 72 hours. Fasting Lipid Panel No results for input(s): CHOL, HDL, LDLCALC, TRIG, CHOLHDL, LDLDIRECT in the last 72 hours. Thyroid Function Tests No results for input(s): TSH, T4TOTAL, T3FREE, THYROIDAB in the last 72 hours.  Invalid input(s): FREET3  Other results:  Imaging/Studies:  Dg Abd 1 View  Result Date: 11/18/2016 CLINICAL DATA:  Evaluate NG tube placement EXAM: ABDOMEN - 1 VIEW COMPARISON:  November 09, 2016 FINDINGS: A feeding tube terminates near the ligament of Treitz. A separate NG tube is not identified. IMPRESSION: A feeding tube terminates near the ligament of Treitz. A separate NG tube is not identified. Electronically  Signed   By: Dorise Bullion III M.D   On: 11/18/2016 08:31    Medications:     Scheduled Medications: . allopurinol  300 mg Per Tube Daily  . amiodarone  200 mg Per Tube BID  . aspirin EC  81 mg Oral Daily   Or  . aspirin  81 mg Per Tube Daily  . atorvastatin  40 mg Per Tube q1800  . chlorhexidine gluconate (MEDLINE KIT)  15 mL Mouth Rinse BID  . famotidine  20 mg Per Tube Q12H  . feeding supplement (PRO-STAT SUGAR FREE 64)  30 mL Per Tube BID  . fentaNYL  50 mcg Transdermal Q72H  . free water  300 mL Per Tube Q6H  . insulin aspart  2-6 Units Subcutaneous Q4H  . insulin glargine  10 Units Subcutaneous BID  . lisinopril  20 mg Per Tube Daily  . mouth rinse  15 mL Mouth Rinse QID  . metoprolol tartrate  50 mg Per Tube BID  . rOPINIRole  2 mg Per Tube BID  . sodium chloride flush  3 mL Intravenous Q12H  . Warfarin - Pharmacist Dosing Inpatient   Does not apply q1800    Infusions: . sodium chloride 20 mL/hr at 11/18/16 0900  . feeding supplement (VITAL HIGH PROTEIN) 1,000 mL (11/18/16 0923)    PRN Medications: acetaminophen (TYLENOL) oral liquid 160 mg/5 mL, fentaNYL (SUBLIMAZE) injection, Gerhardt's butt cream, ipratropium-albuterol, metoprolol, midazolam, morphine injection, ondansetron (ZOFRAN) IV, phenol, sodium chloride flush   Assessment/Plan   Mr Leonetti is 70 year old admitted with CP. Complex operation this admission.  CABG x 3 + bioprosthetic AVR for severe bicuspid AS + ascending aorta replacement + MV repair for flail P2.  MV repair complicated by severe SAM, ended up having to remove posterior annuloplasty band.  Chest closed on 12/3.   1. CAD:  10/22/16 S/P CABG x3 with chest left open.  Chest successfully closed am of 10/26/16. - Continue ASA, statin, b-blocker. ACE added 2. Severe aortic stenosis: 11/302017 S/P AVR bioprostheic  3. Mitral regurgitation: Partial flail leaflet with MV repair.  Developed SAM initially after repair and posterior annuloplasty ring  had to be removed.  SAM resolved.  4. Acute on chronic systolic/diastolic CHF:  TEE at time of chest closure showed EF 40-45%, small cavity size, flattened/hypokinetic septum, s/p MV repair with mild MR, mild to moderately decreased RV systolic function, thrombus in RA. Repeat echo 12/7 with EF 60-65%.  Weaned off off milrinone and phenylephrine.  - Volume status stable. Getting free H2O  due to Hypernatremia. CVP ~5 this am. Will need to start po lasix at some point.  5. C. Diff colitis - Currently on po vanc 6. Elevated LFTs: Likely shock liver.   - Resolved 7. Heme: HIT negative, suspect DIC. Platelet count has recovered.  - warfarin for RA thrombus and atrial fibrillation => INR 2.0, off heparin now.   8. VDRF:  --S/p tracheostomy am of 11/09/16. Doing well on trach collar 9. Atrial fibrillation/flutter: Currently sinus   - Continue couamdin and po amiodarone.  - INR 2.2. Decrease ASA to 81 10. PAD: Finger gangrene, mottled toes.  Unable to doppler PT pulses or get TBIs. - Vascular has seen. Will need amputation of most digits at PIP joints.  11. Hypernatremia - Off lasix. Continue free H2O.   12. HTN - Continue Lisinopril 30 mg daily and metoprolol 25 BID.  Doing well. Going to [facility] today. Echo looks good.  We will follow in [facility].   Glori Bickers, MD 11/18/2016 9:56 AM   Advanced Heart Failure Team Pager [phone] (M-F; 7a - 4p)  Please contact [facility] for night-coverage after hours (4p -7a ) and weekends on [email or web address]

## 2016-11-18 NOTE — Progress Notes (Signed)
OT Discharge Note  Patient Details Name: Scott [name] MRN: [id number] DOB: 1946/10/27   Cancelled Treatment:    Reason Eval/Treat Not Completed: Other (comment) (defer to [facility] ) Pt with pending d/c to [facility] today and will defer all treatment to that venue.   Vonita Moss   OTR/L Pager: [phone] Office: [phone] .  11/18/2016, 10:07 AM

## 2016-11-18 NOTE — Progress Notes (Signed)
  2D Echocardiogram has been performed.  Scott [name] 11/18/2016, 9:47 AM

## 2016-11-18 NOTE — Progress Notes (Signed)
Report given to [facility]; will cont. To monitor.  Ruben Reason

## 2016-11-18 NOTE — Consult Note (Signed)
PCCM Progress Note  ADMISSION DATE:  11/18/2016 CONSULTATION DATE: 10/23/2016 REFERRING MD: Ricard Dillon  CHIEF COMPLAINT:  Chest pain  HISTORY OF PRESENT ILLNESS:   70 yo former smoker with known CAD and ascending aorta aneurysm, who was taken to OR 11/30 for cabg x 3, aortic root replacement, MVR replacement, thoracic cavity left open and had extended pump time in OR. The patient has had a prolonged and difficult postoperative course. He required multiple pressor and inotropic agents initially and also required nitric oxide support. He was unable to be weaned from the ventilator ultimately requiring tracheostomy. Hospital course also complicated by DIC, Atrial fib, limb ischemia, fevers, volume overload, and C. Difficile colitis. Eventually he was able to weaned from pressors, inotrope's, and ventilator to some degree. He was transferred to [place] for [facility] admission and rehabilitation. Pulmonary has been consulted to assist in tracheostomy care including weaning.  SUBJECTIVE:  Alert and awake today. Has not weaned today with move from ICU to [facility]. Family reports he has been doing about 12 hours at a time on ATC. States he is breathing comfortably on full support.  VITAL SIGNS: There were no vitals taken for this visit.  HEMODYNAMICS: CVP:  [6 mmHg-15 mmHg] 8 mmHg  INTAKE / OUTPUT: No intake/output data recorded.  PHYSICAL EXAMINATION:  General: ill appearing male, NAD  Neuro: Awake, following commands  HEENT:  Trach in place, [place]/AT, PERRL, EOM-I and MMM Cardiovascular: IRIR Lungs:  Clear vent assisted breaths Abdomen:  Soft, non tender Ext: 2+ edema, extremities cool Skin: Ischemic changes in fingers/toes  b/l  CMP Latest Ref Rng & Units 11/18/2016 11/17/2016 11/16/2016  Glucose 65 - 99 mg/dL 136(H) 143(H) 145(H)  BUN 6 - 20 mg/dL 27(H) 28(H) 30(H)  Creatinine 0.61 - 1.24 mg/dL 0.58(L) 0.64 0.72  Sodium 135 - 145 mmol/L 147(H) 145 148(H)  Potassium 3.5 - 5.1 mmol/L 3.6 3.9  4.4  Chloride 101 - 111 mmol/L 113(H) 111 112(H)  CO2 22 - 32 mmol/L 29 29 33(H)  Calcium 8.9 - 10.3 mg/dL 8.2(L) 8.6(L) 8.6(L)  Total Protein 6.5 - 8.1 g/dL - - 6.6  Total Bilirubin 0.3 - 1.2 mg/dL - - 1.0  Alkaline Phos 38 - 126 U/L - - 107  AST 15 - 41 U/L - - 50(H)  ALT 17 - 63 U/L - - 33    CBC Latest Ref Rng & Units 11/18/2016 11/17/2016 11/16/2016  WBC 4.0 - 10.5 K/uL 6.4 8.0 10.1  Hemoglobin 13.0 - 17.0 g/dL 8.0(L) 9.7(L) 10.1(L)  Hematocrit 39.0 - 52.0 % 28.1(L) 34.3(L) 36.0(L)  Platelets 150 - 400 K/uL 166 194 239    ABG    Component Value Date/Time   PHART 7.437 11/10/2016 0340   PCO2ART 45.6 11/10/2016 0340   PO2ART 100 11/10/2016 0340   HCO3 29.9 (H) 11/10/2016 0340   TCO2 36 11/04/2016 1950   ACIDBASEDEF 1.0 10/27/2016 1125   O2SAT 97.3 11/10/2016 0340    CBG (last 3)   Recent Labs  11/17/16 1513 11/17/16 2025 11/17/16 2358  GLUCAP 120* 87 92    Imaging: Dg Abd 1 View  Result Date: 11/18/2016 CLINICAL DATA:  Evaluate NG tube placement EXAM: ABDOMEN - 1 VIEW COMPARISON:  November 09, 2016 FINDINGS: A feeding tube terminates near the ligament of Treitz. A separate NG tube is not identified. IMPRESSION: A feeding tube terminates near the ligament of Treitz. A separate NG tube is not identified. Electronically Signed   By: Dorise Bullion III M.D   On:  11/18/2016 08:31    STUDIES:  12.5 dopplers lowers> neg 12/6 dopplers arms > neg 12/7 abdo [place] > Cholelithiasis without sonographic evidence for acute cholecystitis. Increased hepatic echogenicity, suggestive of steatosis. Ascites. 12/7 echo > 60-65%, rv poor images but appeared wnl  CULTURES: C diff 12/12 >> positive Blood 12/12, 12/16 >>ng Rt thigh wound 12/12 >> neg   ANTIBIOTICS:  Acyclovir 12/10 >>12/23 Flagyl 12/13 >> 12/23 PO vancomycin 12/12 >>  SIGNIFICANT EVENTS: 11/30 surgery, hypoxia 12/4 closed chest 12/7 high fevers, LFt up 12/24 ATC 12/26 DC to [facility]  LINES/TUBES: 11/30  ETT>>12/18 Trach (CVTS) 12/18>>> 12/04 picc left >>> 11/30 CT x 4>> R CT x 2 12/16>>>  ASSESSMENT / PLAN:  Acute hypoxic respiratory failure after CABG with MVR. - Continue TC during the day and vent at night. Goal at this point has been 12 hours ATC.  - Continued weaning per [facility] protocol - Follow CXR intermittently - PMV and diet per [facility] - Pulmonary hygeine - PT evaluation and treatment   CAD s/p CABG, MVR, aortic root replacement. A fib. Rt atrial clot. - Per [facility]  Hypernatremia: - Continue free water  C. difficile colitis: ABX as above, per [place], AGACNP-BC [facility] Pager [phone] or [phone]  11/18/2016 2:08 PM  Attending Note:  70 year old male with extensive PMH who had a prolonged hospitalization in [facility] after cardiac surgery.  Now with trach/peg and weaning slowly but continues to require vent at night.  On exam, coarse BS diffusely.  I reviewed CXR myself, trach in good place and diffuse infiltrate noted.  Discussed with SSH-MD and PCCM-NP.  Trach status:  - Maintain current trach size and type  Resp failure:  - TC during the day  - Vent at night  Fluid overload:  - Active diureses  C diff  - Flagyl  - PO vanc  PCCM will follow  Patient seen and examined, agree with above note.  I dictated the care and orders written for this patient under my direction.  Rush Farmer, MD [phone]

## 2016-11-18 NOTE — Progress Notes (Signed)
Called by patient RN; cortrak tube noted to be at 40cm; I rewired and tube was in esophagus; tube reinserted and taped at 100cm; will get KUB for placement.  Warden Fillers RN CCRN

## 2016-11-19 ENCOUNTER — Other Ambulatory Visit (HOSPITAL_COMMUNITY): Payer: Self-pay

## 2016-11-19 DIAGNOSIS — Z4682 Encounter for fitting and adjustment of non-vascular catheter: Secondary | ICD-10-CM | POA: Diagnosis not present

## 2016-11-19 DIAGNOSIS — J9600 Acute respiratory failure, unspecified whether with hypoxia or hypercapnia: Secondary | ICD-10-CM

## 2016-11-19 DIAGNOSIS — J9690 Respiratory failure, unspecified, unspecified whether with hypoxia or hypercapnia: Secondary | ICD-10-CM | POA: Diagnosis not present

## 2016-11-19 LAB — PROTIME-INR
INR: 2.65
Prothrombin Time: 28.8 seconds — ABNORMAL HIGH (ref 11.4–15.2)

## 2016-11-20 ENCOUNTER — Other Ambulatory Visit (HOSPITAL_COMMUNITY): Payer: Self-pay

## 2016-11-20 DIAGNOSIS — R000 Tachycardia, unspecified: Secondary | ICD-10-CM | POA: Diagnosis not present

## 2016-11-20 LAB — C DIFFICILE QUICK SCREEN W PCR REFLEX
C Diff antigen: NEGATIVE
C Diff interpretation: NOT DETECTED
C Diff toxin: NEGATIVE

## 2016-11-20 LAB — TROPONIN I
TROPONIN I: 0.11 ng/mL — AB (ref <0.03)
TROPONIN I: 0.14 ng/mL — AB (ref <0.03)

## 2016-11-20 LAB — RENAL FUNCTION PANEL
ALBUMIN: 1.9 g/dL — AB (ref 3.5–5.0)
Anion gap: 4 — ABNORMAL LOW (ref 5–15)
BUN: 28 mg/dL — ABNORMAL HIGH (ref 6–20)
CALCIUM: 7.6 mg/dL — AB (ref 8.9–10.3)
CO2: 25 mmol/L (ref 22–32)
CREATININE: 0.69 mg/dL (ref 0.61–1.24)
Chloride: 119 mmol/L — ABNORMAL HIGH (ref 101–111)
Glucose, Bld: 187 mg/dL — ABNORMAL HIGH (ref 65–99)
PHOSPHORUS: 2.9 mg/dL (ref 2.5–4.6)
Potassium: 4 mmol/L (ref 3.5–5.1)
SODIUM: 148 mmol/L — AB (ref 135–145)

## 2016-11-20 LAB — BRAIN NATRIURETIC PEPTIDE: B Natriuretic Peptide: 593.8 pg/mL — ABNORMAL HIGH (ref 0.0–100.0)

## 2016-11-20 LAB — PROTIME-INR
INR: 3.44
Prothrombin Time: 35.4 s — ABNORMAL HIGH (ref 11.4–15.2)

## 2016-11-21 LAB — TROPONIN I
Troponin I: 0.15 ng/mL (ref <0.03)
Troponin I: 0.13 ng/mL (ref <0.03)

## 2016-11-21 LAB — PROTIME-INR
INR: 3.08
Prothrombin Time: 32.4 seconds — ABNORMAL HIGH (ref 11.4–15.2)

## 2016-11-22 LAB — URINE CULTURE

## 2016-11-22 LAB — PROTIME-INR
INR: 2.92
Prothrombin Time: 31.1 seconds — ABNORMAL HIGH (ref 11.4–15.2)

## 2016-11-22 NOTE — Progress Notes (Signed)
    Discussed with attending MD. AFIB HR now ~100bpm. Has adequate BP to increase metoprolol to 100mg  BID from 50 BID. Will watch BP closely.   Rate control goal <110 BPM  If AFIB still challenging to control, consider AMIO 400 BID for 3 day to reload.   He was appreciative of input.   Candee Furbish, MD

## 2016-11-23 LAB — BASIC METABOLIC PANEL
Anion gap: 5 (ref 5–15)
BUN: 27 mg/dL — ABNORMAL HIGH (ref 6–20)
CO2: 24 mmol/L (ref 22–32)
Calcium: 7.5 mg/dL — ABNORMAL LOW (ref 8.9–10.3)
Chloride: 121 mmol/L — ABNORMAL HIGH (ref 101–111)
Creatinine, Ser: 0.7 mg/dL (ref 0.61–1.24)
Glucose, Bld: 134 mg/dL — ABNORMAL HIGH (ref 65–99)
POTASSIUM: 3.4 mmol/L — AB (ref 3.5–5.1)
SODIUM: 150 mmol/L — AB (ref 135–145)

## 2016-11-23 LAB — CBC
HCT: 38 % — ABNORMAL LOW (ref 39.0–52.0)
Hemoglobin: 11 g/dL — ABNORMAL LOW (ref 13.0–17.0)
MCH: 27.9 pg (ref 26.0–34.0)
MCHC: 28.9 g/dL — ABNORMAL LOW (ref 30.0–36.0)
MCV: 96.4 fL (ref 78.0–100.0)
PLATELETS: 257 10*3/uL (ref 150–400)
RBC: 3.94 MIL/uL — AB (ref 4.22–5.81)
RDW: 16.9 % — ABNORMAL HIGH (ref 11.5–15.5)
WBC: 14.1 10*3/uL — AB (ref 4.0–10.5)

## 2016-11-24 ENCOUNTER — Other Ambulatory Visit (HOSPITAL_COMMUNITY): Payer: Self-pay

## 2016-11-25 LAB — CULTURE, BLOOD (ROUTINE X 2)
CULTURE: NO GROWTH
Culture: NO GROWTH

## 2016-11-25 LAB — POTASSIUM: POTASSIUM: 4.3 mmol/L (ref 3.5–5.1)

## 2016-11-26 ENCOUNTER — Other Ambulatory Visit (HOSPITAL_COMMUNITY): Payer: Self-pay

## 2016-11-26 DIAGNOSIS — I509 Heart failure, unspecified: Secondary | ICD-10-CM | POA: Diagnosis not present

## 2016-11-26 LAB — STOOL CULTURE REFLEX - CMPCXR

## 2016-11-26 LAB — PROTIME-INR
INR: 8.82 — AB
INR: 8.36
PROTHROMBIN TIME: 75.3 s — AB (ref 11.4–15.2)
Prothrombin Time: 72.2 seconds — ABNORMAL HIGH (ref 11.4–15.2)

## 2016-11-26 LAB — BASIC METABOLIC PANEL
Anion gap: 4 — ABNORMAL LOW (ref 5–15)
BUN: 23 mg/dL — AB (ref 6–20)
CO2: 27 mmol/L (ref 22–32)
Calcium: 7.6 mg/dL — ABNORMAL LOW (ref 8.9–10.3)
Chloride: 115 mmol/L — ABNORMAL HIGH (ref 101–111)
Creatinine, Ser: 0.69 mg/dL (ref 0.61–1.24)
GFR calc Af Amer: >60 mL/min (ref >60)
GLUCOSE: 146 mg/dL — AB (ref 65–99)
Potassium: 4 mmol/L (ref 3.5–5.1)
Sodium: 146 mmol/L — ABNORMAL HIGH (ref 135–145)

## 2016-11-26 LAB — STOOL CULTURE: E coli, Shiga toxin Assay: NEGATIVE

## 2016-11-26 LAB — STOOL CULTURE REFLEX - RSASHR

## 2016-11-27 LAB — PROTIME-INR
INR: 7.85
PROTHROMBIN TIME: 68.6 s — AB (ref 11.4–15.2)

## 2016-11-29 DIAGNOSIS — Z953 Presence of xenogenic heart valve: Secondary | ICD-10-CM

## 2016-11-29 LAB — RENAL FUNCTION PANEL
Albumin: 2 g/dL — ABNORMAL LOW (ref 3.5–5.0)
Anion gap: 4 — ABNORMAL LOW (ref 5–15)
BUN: 19 mg/dL (ref 6–20)
CALCIUM: 8 mg/dL — AB (ref 8.9–10.3)
CO2: 26 mmol/L (ref 22–32)
CREATININE: 0.61 mg/dL (ref 0.61–1.24)
Chloride: 110 mmol/L (ref 101–111)
GFR calc Af Amer: >60 mL/min (ref >60)
GFR calc non Af Amer: >60 mL/min (ref >60)
GLUCOSE: 116 mg/dL — AB (ref 65–99)
Phosphorus: 3 mg/dL (ref 2.5–4.6)
Potassium: 3.6 mmol/L (ref 3.5–5.1)
SODIUM: 140 mmol/L (ref 135–145)

## 2016-11-29 LAB — PROTIME-INR
INR: 5.33 — AB
Prothrombin Time: 50.4 seconds — ABNORMAL HIGH (ref 11.4–15.2)

## 2016-11-30 ENCOUNTER — Other Ambulatory Visit (HOSPITAL_COMMUNITY): Payer: Self-pay

## 2016-11-30 ENCOUNTER — Telehealth (INDEPENDENT_AMBULATORY_CARE_PROVIDER_SITE_OTHER): Payer: Self-pay | Admitting: Orthopedic Surgery

## 2016-11-30 DIAGNOSIS — I70261 Atherosclerosis of native arteries of extremities with gangrene, right leg: Secondary | ICD-10-CM

## 2016-11-30 DIAGNOSIS — I70262 Atherosclerosis of native arteries of extremities with gangrene, left leg: Secondary | ICD-10-CM

## 2016-11-30 DIAGNOSIS — D7582 Heparin induced thrombocytopenia (HIT): Secondary | ICD-10-CM

## 2016-11-30 DIAGNOSIS — D75829 Heparin-induced thrombocytopenia, unspecified: Secondary | ICD-10-CM

## 2016-11-30 DIAGNOSIS — J9690 Respiratory failure, unspecified, unspecified whether with hypoxia or hypercapnia: Secondary | ICD-10-CM | POA: Diagnosis not present

## 2016-11-30 LAB — BASIC METABOLIC PANEL
ANION GAP: 4 — AB (ref 5–15)
BUN: 18 mg/dL (ref 6–20)
CALCIUM: 7.6 mg/dL — AB (ref 8.9–10.3)
CO2: 29 mmol/L (ref 22–32)
Chloride: 109 mmol/L (ref 101–111)
Creatinine, Ser: 0.6 mg/dL — ABNORMAL LOW (ref 0.61–1.24)
GLUCOSE: 110 mg/dL — AB (ref 65–99)
Potassium: 3.4 mmol/L — ABNORMAL LOW (ref 3.5–5.1)
Sodium: 142 mmol/L (ref 135–145)

## 2016-11-30 LAB — PROTIME-INR
INR: 4.44 — AB
Prothrombin Time: 43.5 seconds — ABNORMAL HIGH (ref 11.4–15.2)

## 2016-11-30 LAB — CULTURE, RESPIRATORY

## 2016-11-30 LAB — CBC
HCT: 33.2 % — ABNORMAL LOW (ref 39.0–52.0)
Hemoglobin: 9.8 g/dL — ABNORMAL LOW (ref 13.0–17.0)
MCH: 27.1 pg (ref 26.0–34.0)
MCHC: 29.5 g/dL — ABNORMAL LOW (ref 30.0–36.0)
MCV: 92 fL (ref 78.0–100.0)
PLATELETS: 210 10*3/uL (ref 150–400)
RBC: 3.61 MIL/uL — ABNORMAL LOW (ref 4.22–5.81)
RDW: 16.7 % — AB (ref 11.5–15.5)
WBC: 6.5 10*3/uL (ref 4.0–10.5)

## 2016-11-30 LAB — CULTURE, RESPIRATORY W GRAM STAIN

## 2016-11-30 LAB — TROPONIN I: TROPONIN I: 0.06 ng/mL — AB (ref <0.03)

## 2016-11-30 NOTE — Consult Note (Signed)
ORTHOPAEDIC CONSULTATION  REQUESTING PHYSICIAN: Merton Border, MD  Chief Complaint: Progressive gangrene bilateral upper and lower extremities.  HPI: Scott [name] is a 71 y.o. male who presents with heparin-induced thrombocytopenia thrombosis gangrene to the [name] hands and [name] feet. Patient is having progressive dry gangrenous changes to the upper extremities.  Patient developed chest pain today and is currently being worked up for acute cardiac event. Of note patient is on multiple medications for C. difficile as well as MRSA.  Past Medical History:  Diagnosis Date  . Arthritis    "hips, shoulders; knees; back" (10/20/2016)  . Bicuspid aortic valve   . BPH (benign prostatic hypertrophy)   . Carpal tunnel syndrome of right wrist   . Coronary artery disease involving native coronary artery of native heart with unstable angina pectoris ([place]) 10/20/2016  . Diverticulitis   . GERD (gastroesophageal reflux disease)   . Gout   . Heart murmur   . Hyperlipemia   . Hypertension   . Postoperative atrial fibrillation ([place]) 10/24/2016  . RLS (restless legs syndrome)   . S/P aortic valve replacement with bioprosthetic valve 10/22/2016   25 mm [facility] Ease bovine pericardial bioprosthetic tissue valve  . S/P ascending aortic aneurysm repair 10/22/2016   28 mm supracoronary straight graft replacement of ascending thoracic aortic aneurysm  . S/P CABG x 3 10/22/2016   Sequential LIMA to Diag and LAD, SVG to distal LAD, open vein harvest right thigh  . S/P mitral valve repair 10/22/2016   Artificial Gore-tex neochord placement x6 - posterior annuloplasty band placed but removed due to systolic anterior motion of mitral valve  . Severe aortic stenosis   . Snores    Never been tested for sleep apnea  . Thoracic ascending aortic aneurysm ([place]) 10/21/2016  . Thrombocytopenia ([place]) 10/22/2016  . Type II diabetes mellitus ([place])    Past Surgical History:  Procedure Laterality Date  .  AORTIC VALVE REPLACEMENT N/A 10/22/2016   Procedure: AORTIC VALVE REPLACEMENT (AVR) WITH SIZE 25 MM MAGNA EASE PERICARDIAL BIOPROSTHESIS - AORTIC;  Surgeon: Rexene Alberts, MD;  Location: [place];  Service: Open Heart Surgery;  Laterality: N/A;  . CARDIAC CATHETERIZATION N/A 10/20/2016   Procedure: Right/Left Heart Cath and Coronary Angiography;  Surgeon: Leonie Man, MD;  Location: [place] [facility];  Service: Cardiovascular;  Laterality: N/A;  . CARPAL TUNNEL RELEASE Right 11/28/2013   Procedure: RIGHT WRIST CARPAL TUNNEL RELEASE;  Surgeon: Lorn Junes, MD;  Location: [place];  Service: Orthopedics;  Laterality: Right;  . CATARACT EXTRACTION W/ INTRAOCULAR LENS  IMPLANT, BILATERAL Bilateral 1978  . COLONOSCOPY    . CORONARY ARTERY BYPASS GRAFT N/A 10/22/2016   Procedure: CORONARY ARTERY BYPASS GRAFTING (CABG)x 2 WITH LIMA TO DIAGONAL, OPEN  HARVESTING OF RIGHT SAPHENOUS VEIN FOR VEIN GRAFT TO LAD;  Surgeon: Rexene Alberts, MD;  Location: [place];  Service: Open Heart Surgery;  Laterality: N/A;  . FRACTURE SURGERY    . INGUINAL HERNIA REPAIR Right 1998  . LIPOMA EXCISION Right 2008   "side of my head"  . MITRAL VALVE REPAIR N/A 10/22/2016   Procedure: MITRAL VALVE REPAIR (MVR) WITH SIZE 30 SORIN ANNULOFLEX ANNULOPLASTY RING WITH SUBSEQUENT REMOVAL OF RING;  Surgeon: Rexene Alberts, MD;  Location: [place];  Service: Open Heart Surgery;  Laterality: N/A;  . PENECTOMY  2007   Peyronie's disease   . PENILE PROSTHESIS IMPLANT  2009  . SHOULDER OPEN ROTATOR CUFF REPAIR Right 2006  .  STERNAL CLOSURE N/A 10/26/2016   Procedure: STERNAL WASHOUT AND DELAYED PRIMARY CLOSURE;  Surgeon: Rexene Alberts, MD;  Location: [place];  Service: Thoracic;  Laterality: N/A;  . TEE WITHOUT CARDIOVERSION N/A 10/26/2016   Procedure: TRANSESOPHAGEAL ECHOCARDIOGRAM (TEE);  Surgeon: Rexene Alberts, MD;  Location: [place];  Service: Thoracic;  Laterality: N/A;  . TEE WITHOUT CARDIOVERSION N/A 10/22/2016     Procedure: TRANSESOPHAGEAL ECHOCARDIOGRAM (TEE);  Surgeon: Rexene Alberts, MD;  Location: [place];  Service: Open Heart Surgery;  Laterality: N/A;  . THORACIC AORTIC ANEURYSM REPAIR  10/22/2016   Procedure: ASCENDING AORTIC  ANEURYSM REPAIR (AAA) WITH 28 MM HEMASHIELD PLATINUM WOVEN DOUBLE VELOUR VASCULAR GRAFT;  Surgeon: Rexene Alberts, MD;  Location: [place];  Service: Open Heart Surgery;;  . TONSILLECTOMY  ~ 1955  . TRACHEOSTOMY TUBE PLACEMENT N/A 11/09/2016   Procedure: TRACHEOSTOMY;  Surgeon: Rexene Alberts, MD;  Location: [place];  Service: Thoracic;  Laterality: N/A;  . TRANSURETHRAL RESECTION OF PROSTATE  2005  . TRIGGER FINGER RELEASE Bilateral    several lt and rt hands  . TRIGGER FINGER RELEASE Right 11/28/2013   Procedure: RIGHT TRIGGER FINGER  RELEASE (TENDON SHEATH INCISION);  Surgeon: Lorn Junes, MD;  Location: [place];  Service: Orthopedics;  Laterality: Right;  Marland Kitchen VIDEO BRONCHOSCOPY N/A 11/09/2016   Procedure: VIDEO BRONCHOSCOPY;  Surgeon: Rexene Alberts, MD;  Location: [place];  Service: Thoracic;  Laterality: N/A;  . WRIST FRACTURE SURGERY Right ~ 90   Social History   Social History  . Marital status: Married    Spouse name: N/A  . Number of children: N/A  . Years of education: N/A   Occupational History  . [employer]    retired   Social History Main Topics  . Smoking status: Former Smoker    Years: 3.00    Types: Pipe, Cigars    Quit date: 1984  . Smokeless tobacco: Never Used  . Alcohol use 6.0 oz/week    10 Cans of beer per week  . Drug use: No  . Sexual activity: Not Currently   Other Topics Concern  . Not on file   Social History Narrative  . No narrative on file   Family History  Problem Relation Age of Onset  . Lung cancer Mother   . Clotting disorder Father     No details  . Heart disease Sister 24    Stents  . Cancer Sister     Throat   - negative except otherwise stated in the family history  section Allergies  Allergen Reactions  . Doxycycline Hives  . Penicillins Hives     Has patient had a PCN reaction causing immediate rash, facial/tongue/throat swelling, SOB or lightheadedness with hypotension: No Has patient had a PCN reaction causing severe rash involving mucus membranes or skin necrosis: No Has patient had a PCN reaction that required hospitalization: No Has patient had a PCN reaction occurring within the last 10 years:# # # YES # # #  If all of the above answers are "NO", then may proceed with Cephalosporin use.   . Sulfa Antibiotics Hives   Prior to Admission medications   Medication Sig Start Date End Date Taking? Authorizing Provider  allopurinol (ZYLOPRIM) 300 MG tablet Take 300 mg by mouth daily.    Historical Provider, MD  aspirin 81 MG tablet Take 81 mg by mouth 2 (two) times daily.     Historical Provider, MD  Calcium-Magnesium-Zinc (CAL-MAG-ZINC PO) Take 1-2 tablets by mouth 2 (two) times daily. 1 tablet in the AM and 2 tablets in the PM.    Historical Provider, MD  Cholecalciferol (VITAMIN D3) 1000 units CAPS Take 1,000 Units by mouth at bedtime.     Historical Provider, MD  enalapril (VASOTEC) 20 MG tablet Take 20 mg by mouth 2 (two) times daily.    Historical Provider, MD  finasteride (PROSCAR) 5 MG tablet Take 5 mg by mouth daily.     Historical Provider, MD  gemfibrozil (LOPID) 600 MG tablet Take 600 mg by mouth 2 (two) times daily before a meal.    Historical Provider, MD  Melatonin 10 MG CAPS Take 10 mg by mouth at bedtime.     Historical Provider, MD  metFORMIN (GLUCOPHAGE-XR) 500 MG 24 hr tablet Take 500 mg by mouth at bedtime.  08/21/16   Historical Provider, MD  metoprolol succinate (TOPROL XL) 25 MG 24 hr tablet Take 1 tablet (25 mg total) by mouth daily. 10/19/16   Minus Breeding, MD  nitroGLYCERIN (NITROSTAT) 0.4 MG SL tablet Place 0.4 mg under the tongue every 5 (five) minutes as needed.  10/16/16   Historical Provider, MD  Omega-3 Fatty Acids  (FISH OIL) 1000 MG CPDR Take 2,000 mg by mouth 2 (two) times daily.     Historical Provider, MD  rOPINIRole (REQUIP) 2 MG tablet Take 2 mg by mouth See admin instructions. Takes 2mg  at 4:30pm and 2mg  at 9:30pm.    Historical Provider, MD  Turmeric 500 MG CAPS Take 500 mg by mouth 2 (two) times daily.     Historical Provider, MD  vitamin E 400 UNIT capsule Take 400 Units by mouth daily.    Historical Provider, MD   Dg Chest Port 1 View  Result Date: 11/30/2016 CLINICAL DATA:  Respiratory failure, tracheostomy, post CABG, hypertension, diabetes mellitus EXAM: PORTABLE CHEST 1 VIEW COMPARISON:  Portable exam 0746 hours compared 11/26/2016 FINDINGS: Tracheostomy tube, nasogastric tube, and LEFT arm PICC line unchanged. Enlargement of cardiac silhouette post CABG and AVR. Pulmonary vascular congestion. Persistent perihilar edema and bibasilar effusions compatible with CHF. Associated bibasilar atelectasis. No pneumothorax. Bones demineralized. IMPRESSION: CHF with bibasilar effusions and mild atelectasis. Electronically Signed   By: Lavonia Dana M.D.   On: 11/30/2016 08:37   - pertinent xrays, CT, MRI studies were reviewed and independently interpreted  Positive ROS: All other systems have been reviewed and were otherwise negative with the exception of those mentioned in the HPI and as above.  Physical Exam: General: Alert, no acute distress Psychiatric: Patient is competent for consent with normal mood and affect Lymphatic: No axillary or cervical lymphadenopathy Cardiovascular: No pedal edema Respiratory: No cyanosis, no use of accessory musculature GI: No organomegaly, abdomen is soft and non-tender  Skin: Patient has progressive gangrenous changes left upper extremity with dry gangrene all the way up to his wrist. Right hand shows dry gangrenous changes to the MCP joints without involvement of the thumb.   Neurologic: Patient does not have protective sensation bilateral upper and lower  extremities.   MUSCULOSKELETAL:  Patient has no cellulitis from the dry gangrene. The soft tissue proximal is L3 and viable. Patient has dry gangrenous eschar up to the ankle for [name] lower extremities but does have palpable pulses.  Assessment: Assessment: Acute chest pain at this time with current antibiotic regimen for C. difficile and MRSA with dry gangrene of the left hand up to the wrist and right hand involving all digits  up to the MCP joint with gout involvement. With dry gangrenous eschar up to the ankle for [name] feet.  Plan: Plan: We will need more time for patient to work through his current metabolic stresses including the C. difficile and MRSA and his recent chest pain. Once patient is more medically stable we will need to proceed with amputations involving all 4 extremities. Most likely will require an amputation of the left upper extremity through the wrist with amputation of the right upper extremity through the MCP joint for the index through little finger without involvement of the thumb and amputations of [name] lower extremities pending healing of the eschar up to the ankle.  Thank you for the consult and the opportunity to see Scott [name], [place] [phone] 6:51 PM

## 2016-11-30 NOTE — Telephone Encounter (Signed)
Scott [name] "select specialty" pt is in room [phone].  Requested a visit from dr duda for "excivia extremities"  [phone]

## 2016-11-30 NOTE — Telephone Encounter (Signed)
I called and spoke with Mimi on 5700 she advised that it was for ischemia of extremities. She advised that is all information she has. There is not much more information in epic, patient does have tracheotomy. But we need more clarification of what this consult is for. Per MD they need to have Dr. Laren Everts call him and discuss this patient in more detail.

## 2016-12-01 ENCOUNTER — Encounter: Payer: Self-pay | Admitting: General Surgery

## 2016-12-01 LAB — TROPONIN I: Troponin I: 0.05 ng/mL (ref <0.03)

## 2016-12-01 LAB — POTASSIUM: Potassium: 3.3 mmol/L — ABNORMAL LOW (ref 3.5–5.1)

## 2016-12-01 LAB — PROTIME-INR
INR: 3.71
Prothrombin Time: 37.7 seconds — ABNORMAL HIGH (ref 11.4–15.2)

## 2016-12-01 LAB — VANCOMYCIN, TROUGH: VANCOMYCIN TR: 16 ug/mL (ref 15–20)

## 2016-12-01 NOTE — Consult Note (Signed)
  Chief Complaint: respiratory failure  Referring Physician:Dr. Ali Hijazi  Supervising Physician: Watts, John  Patient Status: [facility] inpatient  HPI: Scott [name] is an 71 y.o. male who underwent a CABG x3 with ascending aortic aneurysm repair, and AV replacement in November who had a complicated course by VDRF, C. Diff, MRSA, a. Fib, DIC with dry gangrene of all 4 extremities who is [facility].  He has been weaned to trach collar, but developed some chest pain yesterday along with diaphoresis etc.  He was felt to possibly have a troponin leak, but no major cardiac event.  Because of this, he was returned to the vent today for rest.  He has been on coumadin secondary to his A. Fib, but this has been held several days secondary to a supratherapeutic INR.  His INR today is 3.4.  He has been evaluated by ortho for probable amputations of all 4 extremities, hands and feet.  We have been asked to evaluate the patient for g-tube placement.  Past Medical History:  Past Medical History:  Diagnosis Date  . Arthritis    "hips, shoulders; knees; back" (10/20/2016)  . Bicuspid aortic valve   . BPH (benign prostatic hypertrophy)   . Carpal tunnel syndrome of right wrist   . Coronary artery disease involving native coronary artery of native heart with unstable angina pectoris (HCC) 10/20/2016  . Diverticulitis   . GERD (gastroesophageal reflux disease)   . Gout   . Heart murmur   . Hyperlipemia   . Hypertension   . Postoperative atrial fibrillation (HCC) 10/24/2016  . RLS (restless legs syndrome)   . S/P aortic valve replacement with bioprosthetic valve 10/22/2016   25 mm Edwards Magna Ease bovine pericardial bioprosthetic tissue valve  . S/P ascending aortic aneurysm repair 10/22/2016   28 mm supracoronary straight graft replacement of ascending thoracic aortic aneurysm  . S/P CABG x 3 10/22/2016   Sequential LIMA to Diag and LAD, SVG to distal  LAD, open vein harvest right thigh  . S/P mitral valve repair 10/22/2016   Artificial Gore-tex neochord placement x6 - posterior annuloplasty band placed but removed due to systolic anterior motion of mitral valve  . Severe aortic stenosis   . Snores    Never been tested for sleep apnea  . Thoracic ascending aortic aneurysm (HCC) 10/21/2016  . Thrombocytopenia (HCC) 10/22/2016  . Type II diabetes mellitus (HCC)     Past Surgical History:  Past Surgical History:  Procedure Laterality Date  . AORTIC VALVE REPLACEMENT N/A 10/22/2016   Procedure: AORTIC VALVE REPLACEMENT (AVR) WITH SIZE 25 MM MAGNA EASE PERICARDIAL BIOPROSTHESIS - AORTIC;  Surgeon: Clarence H Owen, MD;  Location: [facility];  Service: Open Heart Surgery;  Laterality: N/A;  . CARDIAC CATHETERIZATION N/A 10/20/2016   Procedure: Right/Left Heart Cath and Coronary Angiography;  Surgeon: David W Harding, MD;  Location: [facility];  Service: Cardiovascular;  Laterality: N/A;  . CARPAL TUNNEL RELEASE Right 11/28/2013   Procedure: RIGHT WRIST CARPAL TUNNEL RELEASE;  Surgeon: Robert A Wainer, MD;  Location: [facility];  Service: Orthopedics;  Laterality: Right;  . CATARACT EXTRACTION W/ INTRAOCULAR LENS  IMPLANT, BILATERAL Bilateral 1978  . COLONOSCOPY    . CORONARY ARTERY BYPASS GRAFT N/A 10/22/2016   Procedure: CORONARY ARTERY BYPASS GRAFTING (CABG)x 2 WITH LIMA TO DIAGONAL, OPEN  HARVESTING OF RIGHT SAPHENOUS VEIN FOR VEIN GRAFT TO LAD;  Surgeon: Clarence H Owen, MD;  Location:   [facility] OR;  Service: Open Heart Surgery;  Laterality: N/A;  . FRACTURE SURGERY    . INGUINAL HERNIA REPAIR Right 1998  . LIPOMA EXCISION Right 2008   "side of my head"  . MITRAL VALVE REPAIR N/A 10/22/2016   Procedure: MITRAL VALVE REPAIR (MVR) WITH SIZE 30 SORIN ANNULOFLEX ANNULOPLASTY RING WITH SUBSEQUENT REMOVAL OF RING;  Surgeon: Clarence H Owen, MD;  Location: [facility];  Service: Open Heart Surgery;  Laterality: N/A;  . PENECTOMY  2007    Peyronie's disease   . PENILE PROSTHESIS IMPLANT  2009  . SHOULDER OPEN ROTATOR CUFF REPAIR Right 2006  . STERNAL CLOSURE N/A 10/26/2016   Procedure: STERNAL WASHOUT AND DELAYED PRIMARY CLOSURE;  Surgeon: Clarence H Owen, MD;  Location: [facility];  Service: Thoracic;  Laterality: N/A;  . TEE WITHOUT CARDIOVERSION N/A 10/26/2016   Procedure: TRANSESOPHAGEAL ECHOCARDIOGRAM (TEE);  Surgeon: Clarence H Owen, MD;  Location: [facility];  Service: Thoracic;  Laterality: N/A;  . TEE WITHOUT CARDIOVERSION N/A 10/22/2016   Procedure: TRANSESOPHAGEAL ECHOCARDIOGRAM (TEE);  Surgeon: Clarence H Owen, MD;  Location: [facility];  Service: Open Heart Surgery;  Laterality: N/A;  . THORACIC AORTIC ANEURYSM REPAIR  10/22/2016   Procedure: ASCENDING AORTIC  ANEURYSM REPAIR (AAA) WITH 28 MM HEMASHIELD PLATINUM WOVEN DOUBLE VELOUR VASCULAR GRAFT;  Surgeon: Clarence H Owen, MD;  Location: [facility];  Service: Open Heart Surgery;;  . TONSILLECTOMY  ~ 1955  . TRACHEOSTOMY TUBE PLACEMENT N/A 11/09/2016   Procedure: TRACHEOSTOMY;  Surgeon: Clarence H Owen, MD;  Location: [facility];  Service: Thoracic;  Laterality: N/A;  . TRANSURETHRAL RESECTION OF PROSTATE  2005  . TRIGGER FINGER RELEASE Bilateral    several lt and rt hands  . TRIGGER FINGER RELEASE Right 11/28/2013   Procedure: RIGHT TRIGGER FINGER  RELEASE (TENDON SHEATH INCISION);  Surgeon: Robert A Wainer, MD;  Location: [facility];  Service: Orthopedics;  Laterality: Right;  . VIDEO BRONCHOSCOPY N/A 11/09/2016   Procedure: VIDEO BRONCHOSCOPY;  Surgeon: Clarence H Owen, MD;  Location: [facility];  Service: Thoracic;  Laterality: N/A;  . WRIST FRACTURE SURGERY Right ~ 1959    Family History:  Family History  Problem Relation Age of Onset  . Lung cancer Mother   . Clotting disorder Father     No details  . Heart disease Sister 50    Stents  . Cancer Sister     Throat    Social History:  reports that he quit smoking about 34 years ago. His smoking use included Pipe and  Cigars. He quit after 3.00 years of use. He has never used smokeless tobacco. He reports that he drinks about 6.0 oz of alcohol per week . He reports that he does not use drugs.  Allergies:  Allergies  Allergen Reactions  . Doxycycline Hives  . Penicillins Hives     Has patient had a PCN reaction causing immediate rash, facial/tongue/throat swelling, SOB or lightheadedness with hypotension: No Has patient had a PCN reaction causing severe rash involving mucus membranes or skin necrosis: No Has patient had a PCN reaction that required hospitalization: No Has patient had a PCN reaction occurring within the last 10 years:# # # YES # # #  If all of the above answers are "NO", then may proceed with Cephalosporin use.   . Sulfa Antibiotics Hives    Medications: Medications reviewed in paper chart  ROS: unable to obtain as the patient is trached on vent and unable to phonate and speak.    Mallampati Score: MD Evaluation Airway: Other (comments) Airway comments: trach in place Heart: WNL Abdomen: WNL Chest/ Lungs: WNL Other Pertinent Findings: diffuse extremity gangrene  ASA  Classification: 4  Physical Exam: Vitals have been reviewed in paper chart General: chronically ill-appearing white male who is laying in bed in NAD HEENT: head is normocephalic, atraumatic.  Sclera are noninjected.  PERRL.  Ears and nose without any masses or lesions.  Mouth is pink.  NGT present in his nose.  Trach in place in midline of neck Heart: regular, rate, and rhythm.  Normal s1,s2. No obvious murmurs, gallops, or rubs noted.  Palpable radial pulses bilaterally Lungs: CTAB, no wheezes, rhonchi, or rales noted.  Respiratory effort nonlabored on ventilator Abd: soft, NT, ND, +BS, no masses, hernias, or organomegaly, anasarca present MS: all 4 extremities, hands and feet, with dry gangrene.  Fingers are contractured in a flexed position Psych: Alert and seems oriented.  Unable to answer questions to further  assess psych status   Labs: Results for orders placed or performed during the hospital encounter of 11/18/16 (from the past 48 hour(s))  Renal function panel     Status: Abnormal   Collection Time: 11/29/16  4:30 PM  Result Value Ref Range   Sodium 140 135 - 145 mmol/L   Potassium 3.6 3.5 - 5.1 mmol/L   Chloride 110 101 - 111 mmol/L   CO2 26 22 - 32 mmol/L   Glucose, Bld 116 (H) 65 - 99 mg/dL   BUN 19 6 - 20 mg/dL   Creatinine, Ser 0.61 0.61 - 1.24 mg/dL   Calcium 8.0 (L) 8.9 - 10.3 mg/dL   Phosphorus 3.0 2.5 - 4.6 mg/dL   Albumin 2.0 (L) 3.5 - 5.0 g/dL   GFR calc non Af Amer >60 >60 mL/min   GFR calc Af Amer >60 >60 mL/min    Comment: (NOTE) The eGFR has been calculated using the CKD EPI equation. This calculation has not been validated in all clinical situations. eGFR's persistently <60 mL/min signify possible Chronic Kidney Disease.    Anion gap 4 (L) 5 - 15  CBC     Status: Abnormal   Collection Time: 11/30/16  5:29 AM  Result Value Ref Range   WBC 6.5 4.0 - 10.5 K/uL   RBC 3.61 (L) 4.22 - 5.81 MIL/uL   Hemoglobin 9.8 (L) 13.0 - 17.0 g/dL   HCT 33.2 (L) 39.0 - 52.0 %   MCV 92.0 78.0 - 100.0 fL   MCH 27.1 26.0 - 34.0 pg   MCHC 29.5 (L) 30.0 - 36.0 g/dL   RDW 16.7 (H) 11.5 - 15.5 %   Platelets 210 150 - 400 K/uL  Basic metabolic panel     Status: Abnormal   Collection Time: 11/30/16  5:29 AM  Result Value Ref Range   Sodium 142 135 - 145 mmol/L   Potassium 3.4 (L) 3.5 - 5.1 mmol/L   Chloride 109 101 - 111 mmol/L   CO2 29 22 - 32 mmol/L   Glucose, Bld 110 (H) 65 - 99 mg/dL   BUN 18 6 - 20 mg/dL   Creatinine, Ser 0.60 (L) 0.61 - 1.24 mg/dL   Calcium 7.6 (L) 8.9 - 10.3 mg/dL   GFR calc non Af Amer >60 >60 mL/min   GFR calc Af Amer >60 >60 mL/min    Comment: (NOTE) The eGFR has been calculated using the CKD EPI equation. This calculation has not been validated in all clinical situations. eGFR's persistently <60 mL/min   signify possible Chronic Kidney Disease.     Anion gap 4 (L) 5 - 15  Protime-INR     Status: Abnormal   Collection Time: 11/30/16  5:29 AM  Result Value Ref Range   Prothrombin Time 43.5 (H) 11.4 - 15.2 seconds   INR 4.44 (HH)     Comment: REPEATED TO VERIFY CRITICAL RESULT CALLED TO, READ BACK BY AND VERIFIED WITH: CHARLES EMERSON,RN AT 0734 11/30/16 BY ZBEECH.   Troponin I     Status: Abnormal   Collection Time: 11/30/16  7:59 PM  Result Value Ref Range   Troponin I 0.06 (HH) <0.03 ng/mL    Comment: CRITICAL RESULT CALLED TO, READ BACK BY AND VERIFIED WITH: J. NOSSER RN [id number] GREEN R   Potassium     Status: Abnormal   Collection Time: 12/01/16  4:15 AM  Result Value Ref Range   Potassium 3.3 (L) 3.5 - 5.1 mmol/L  Protime-INR     Status: Abnormal   Collection Time: 12/01/16  4:15 AM  Result Value Ref Range   Prothrombin Time 37.7 (H) 11.4 - 15.2 seconds   INR 3.71   Troponin I     Status: Abnormal   Collection Time: 12/01/16  4:15 AM  Result Value Ref Range   Troponin I 0.05 (HH) <0.03 ng/mL    Comment: CRITICAL VALUE NOTED.  VALUE IS CONSISTENT WITH PREVIOUSLY REPORTED AND CALLED VALUE.  Vancomycin, trough     Status: None   Collection Time: 12/01/16  4:16 AM  Result Value Ref Range   Vancomycin Tr 16 15 - 20 ug/mL    Imaging: Dg Chest Port 1 View  Result Date: 11/30/2016 CLINICAL DATA:  Respiratory failure, tracheostomy, post CABG, hypertension, diabetes mellitus EXAM: PORTABLE CHEST 1 VIEW COMPARISON:  Portable exam 0746 hours compared 11/26/2016 FINDINGS: Tracheostomy tube, nasogastric tube, and LEFT arm PICC line unchanged. Enlargement of cardiac silhouette post CABG and AVR. Pulmonary vascular congestion. Persistent perihilar edema and bibasilar effusions compatible with CHF. Associated bibasilar atelectasis. No pneumothorax. Bones demineralized. IMPRESSION: CHF with bibasilar effusions and mild atelectasis. Electronically Signed   By: Mark  Boles M.D.   On: 11/30/2016 08:37    Assessment/Plan 1.  Respiratory failure with secondary dysphagia -the patient's anatomy on his CT scan is not promising as his colon is overlying his stomach currently.  Once his INR has normalized to 1.5 or below, then we can try to bring the patient down to the radiology suite and attempt to give him a barium enema to determine if we can get his colon out of the way for placement.  If we are not able to, then we will not be able to proceed with percutaneous gastrostomy tube placement and would likely need a surgical evaluation.   -he currently has access for feeding and medications, so if he is taken to the OR in the next couple of days as well for his hands and feet, then we can plan for after that as well.  -we will follow and chart check and watch his INR. -will also try to come back up at a time his wife can be present as well to explain this to her and allow him time to write his questions down since he is unable to speak currently. -we will follow  Thank you for this interesting consult.  I greatly enjoyed meeting Garl B Westcott and look forward to participating in their care.  A copy of this report was sent to the requesting provider on   this date.  Electronically Signed: Henreitta Cea 12/01/2016, 3:50 PM   I spent a total of 40 Minutes    in face to face in clinical consultation, greater than 50% of which was counseling/coordinating care for respiratory failure, dysphagia

## 2016-12-02 LAB — PROTIME-INR
INR: 3.01
PROTHROMBIN TIME: 31.9 s — AB (ref 11.4–15.2)

## 2016-12-03 LAB — PROTIME-INR
INR: 2.21
Prothrombin Time: 24.9 seconds — ABNORMAL HIGH (ref 11.4–15.2)

## 2016-12-04 LAB — BLOOD GAS, ARTERIAL
ACID-BASE EXCESS: 13.7 mmol/L — AB (ref 0.0–2.0)
Bicarbonate: 38.6 mmol/L — ABNORMAL HIGH (ref 20.0–28.0)
FIO2: 0.28
O2 SAT: 96.4 %
PCO2 ART: 56.8 mmHg — AB (ref 32.0–48.0)
Patient temperature: 98.6
pH, Arterial: 7.448 (ref 7.350–7.450)
pO2, Arterial: 84.2 mmHg (ref 83.0–108.0)

## 2016-12-04 LAB — PROTIME-INR
INR: 1.56
Prothrombin Time: 18.8 seconds — ABNORMAL HIGH (ref 11.4–15.2)

## 2016-12-07 ENCOUNTER — Other Ambulatory Visit (HOSPITAL_COMMUNITY): Payer: Self-pay

## 2016-12-07 ENCOUNTER — Encounter (HOSPITAL_COMMUNITY): Payer: Self-pay | Admitting: Interventional Radiology

## 2016-12-07 DIAGNOSIS — K929 Disease of digestive system, unspecified: Secondary | ICD-10-CM | POA: Diagnosis not present

## 2016-12-07 HISTORY — PX: IR GENERIC HISTORICAL: IMG1180011

## 2016-12-07 LAB — PROTIME-INR
INR: 1.55
Prothrombin Time: 18.7 seconds — ABNORMAL HIGH (ref 11.4–15.2)

## 2016-12-07 MED ORDER — MIDAZOLAM HCL 2 MG/2ML IJ SOLN
INTRAMUSCULAR | Status: AC
  Filled 2016-12-07: qty 2

## 2016-12-07 MED ORDER — LIDOCAINE HCL (PF) 1 % IJ SOLN
INTRAMUSCULAR | Status: DC | PRN
  Administered 2016-12-07: 5 mL

## 2016-12-07 MED ORDER — VANCOMYCIN HCL IN DEXTROSE 1-5 GM/200ML-% IV SOLN
INTRAVENOUS | Status: AC
  Administered 2016-12-07: 1000 mg via INTRAVENOUS
  Filled 2016-12-07: qty 200

## 2016-12-07 MED ORDER — VANCOMYCIN HCL IN DEXTROSE 1-5 GM/200ML-% IV SOLN
1000.0000 mg | Freq: Once | INTRAVENOUS | Status: AC
  Administered 2016-12-07: 1000 mg via INTRAVENOUS
  Filled 2016-12-07: qty 200

## 2016-12-07 MED ORDER — IOPAMIDOL (ISOVUE-300) INJECTION 61%
INTRAVENOUS | Status: AC
  Administered 2016-12-07: 20 mL
  Filled 2016-12-07: qty 50

## 2016-12-07 MED ORDER — FENTANYL CITRATE (PF) 100 MCG/2ML IJ SOLN
INTRAMUSCULAR | Status: AC
  Filled 2016-12-07: qty 2

## 2016-12-07 MED ORDER — GLUCAGON HCL RDNA (DIAGNOSTIC) 1 MG IJ SOLR
INTRAMUSCULAR | Status: AC
  Filled 2016-12-07: qty 1

## 2016-12-07 MED ORDER — FENTANYL CITRATE (PF) 100 MCG/2ML IJ SOLN
INTRAMUSCULAR | Status: DC | PRN
  Administered 2016-12-07 (×2): 25 ug via INTRAVENOUS
  Administered 2017-02-01 (×4): 50 ug via INTRAVENOUS

## 2016-12-07 MED ORDER — MIDAZOLAM HCL 2 MG/2ML IJ SOLN
INTRAMUSCULAR | Status: DC | PRN
  Administered 2016-12-07 (×2): 0.5 mg via INTRAVENOUS
  Administered 2017-02-01: 1 mg via INTRAVENOUS

## 2016-12-07 MED ORDER — LIDOCAINE HCL (PF) 1 % IJ SOLN
INTRAMUSCULAR | Status: AC
  Filled 2016-12-07: qty 30

## 2016-12-07 MED ORDER — SODIUM CHLORIDE 0.9 % IV SOLN
INTRAVENOUS | Status: DC | PRN
  Administered 2016-12-07: 10 mL/h via INTRAVENOUS

## 2016-12-07 NOTE — Sedation Documentation (Signed)
Patient is resting comfortably. 

## 2016-12-07 NOTE — Sedation Documentation (Signed)
Patient denies pain and is resting comfortably.  

## 2016-12-07 NOTE — Procedures (Signed)
Interventional Radiology Procedure Note  Procedure: Percutaneous gastrostomy tube placement  Complications: None  Estimated Blood Loss: < 10 mL  20 Fr bumper retention gastrostomy placed with tip in body of stomach.  OK to use in 24 hours.  Scott [name]. Scott [name], M.D Pager:  [phone]

## 2016-12-07 NOTE — Sedation Documentation (Addendum)
Patient is resting comfortably. 

## 2016-12-07 NOTE — Sedation Documentation (Signed)
No ports on picc line working. Sr [place] aware and placing a micro puncture. Resp put patient on vent. Unable to get good Spo2 wave on ears. Hands and feet unavailable due to gangrene.

## 2016-12-08 LAB — PROTIME-INR
INR: 1.77
PROTHROMBIN TIME: 20.9 s — AB (ref 11.4–15.2)

## 2016-12-08 NOTE — Progress Notes (Signed)
Referring Physician(s): Dr. Merton Border  Supervising Physician: Daryll Brod  Patient Status:  [facility] - In-pt  Chief Complaint:  Respiratory failure Need for Gastrostomy tube for long term care  Subjective:  Patient awake and resting comfortably. Family at bedside. Tube feeds not started yet.  Allergies: Doxycycline; Penicillins; and Sulfa antibiotics  Medications: Prior to Admission medications   Medication Sig Start Date End Date Taking? Authorizing Provider  allopurinol (ZYLOPRIM) 300 MG tablet Take 300 mg by mouth daily.    Historical Provider, MD  aspirin 81 MG tablet Take 81 mg by mouth 2 (two) times daily.     Historical Provider, MD  Calcium-Magnesium-Zinc (CAL-MAG-ZINC PO) Take 1-2 tablets by mouth 2 (two) times daily. 1 tablet in the AM and 2 tablets in the PM.    Historical Provider, MD  Cholecalciferol (VITAMIN D3) 1000 units CAPS Take 1,000 Units by mouth at bedtime.     Historical Provider, MD  enalapril (VASOTEC) 20 MG tablet Take 20 mg by mouth 2 (two) times daily.    Historical Provider, MD  finasteride (PROSCAR) 5 MG tablet Take 5 mg by mouth daily.     Historical Provider, MD  gemfibrozil (LOPID) 600 MG tablet Take 600 mg by mouth 2 (two) times daily before a meal.    Historical Provider, MD  Melatonin 10 MG CAPS Take 10 mg by mouth at bedtime.     Historical Provider, MD  metFORMIN (GLUCOPHAGE-XR) 500 MG 24 hr tablet Take 500 mg by mouth at bedtime.  08/21/16   Historical Provider, MD  metoprolol succinate (TOPROL XL) 25 MG 24 hr tablet Take 1 tablet (25 mg total) by mouth daily. 10/19/16   Minus Breeding, MD  nitroGLYCERIN (NITROSTAT) 0.4 MG SL tablet Place 0.4 mg under the tongue every 5 (five) minutes as needed.  10/16/16   Historical Provider, MD  Omega-3 Fatty Acids (FISH OIL) 1000 MG CPDR Take 2,000 mg by mouth 2 (two) times daily.     Historical Provider, MD  rOPINIRole (REQUIP) 2 MG tablet Take 2 mg by mouth See admin instructions. Takes 100m at 4:30pm  and 290mat 9:30pm.    Historical Provider, MD  Turmeric 500 MG CAPS Take 500 mg by mouth 2 (two) times daily.     Historical Provider, MD  vitamin E 400 UNIT capsule Take 400 Units by mouth daily.    Historical Provider, MD     Vital Signs: BP 103/73   Pulse 83   Resp 13   SpO2 93%   Physical Exam Awake NAD NGT remains in place Abdomen soft, flat G-tube site looks good, dressing clean, no bleeding or drainage.  Imaging: Ir Gastrostomy Tube Mod Sed  Result Date: 12/07/2016 CLINICAL DATA:  Respiratory failure and need for gastrostomy tube for nutritional purposes. EXAM: ULTRASOUND GUIDANCE FOR VASCULAR ACCESS AND IV CATHETER PLACEMENT PERCUTANEOUS GASTROSTOMY TUBE PLACEMENT ANESTHESIA/SEDATION: 1.0 mg IV Versed; 50 mcg IV Fentanyl. Total Moderate Sedation Time 23 minutes. CONTRAST:  20 mL Isovue-300 MEDICATIONS: 1 g IV vancomycin. IV antibiotic was administered in an appropriate time interval prior to needle puncture of the skin. FLUOROSCOPY TIME:  13 minutes and 24 seconds.  413.6 mGy. PROCEDURE: The procedure, risks, benefits, and alternatives were explained to the patient's wife. Questions regarding the procedure were encouraged and answered. The patient's wife understands and consents to the procedure. Initial assessment of an indwelling left arm PICC line was performed. Fluoroscopy was then performed to check PICC line positioning. Additional IV access was needed  for vancomycin administration and IV conscious sedation. The right upper arm was prepped with chlorhexidine and draped. Ultrasound was used to assess venous patency. Under direct ultrasound guidance, the right basilic vein was punctured with a 21 gauge needle. A guidewire was advanced through the needle. A 4 French micropuncture dilator was then advanced over the wire. This was utilized for intravenous access during the procedure. A 5-French catheter was then advanced through the the patient's mouth under fluoroscopy into the  esophagus and to the level of the stomach. This catheter was used to insufflate the stomach with air under fluoroscopy. The abdominal wall was prepped with Betadine in a sterile fashion, and a sterile drape was applied covering the operative field. A sterile gown and sterile gloves were used for the procedure. Local anesthesia was provided with 1% Lidocaine. A skin incision was made in the upper abdominal wall. Under fluoroscopy, an 18 gauge trocar needle was advanced into the stomach. Contrast injection was performed to confirm intraluminal position of the needle tip. A single T tack was then deployed in the lumen of the stomach. This was brought up to tension at the skin surface. Over a guidewire, a 9-French sheath was advanced into the lumen of the stomach. The wire was left in place as a safety wire. A loop snare device from a percutaneous gastrostomy kit was then advanced into the stomach. A floppy guide wire was advanced through the orogastric catheter under fluoroscopy in the stomach. The loop snare advanced through the percutaneous gastric access was used to snare the guide wire. This allowed withdrawal of the loop snare out of the patient's mouth by retraction of the orogastric catheter and wire. A 20-French bumper retention gastrostomy tube was looped around the snare device. It was then pulled back through the patient's mouth. The retention bumper was brought up to the anterior gastric wall. The T tack suture was cut at the skin. The exiting gastrostomy tube was cut to appropriate length and a feeding adapter applied. The catheter was injected with contrast material to confirm position and a fluoroscopic spot image saved. The tube was then flushed with saline. A dressing was applied over the gastrostomy exit site. COMPLICATIONS: None. FINDINGS: A pre-existing PICC line was non functioning and occluded when connected to an IV palm. Fluoroscopy demonstrates coiling of the distal portion of the catheter at  the base of the left internal jugular vein. New IV access was therefore necessary to proceed with IV antibiotic administration and sedation. Successful access was established in the right basilic vein above the elbow. The stomach distended well with air allowing safe placement of the gastrostomy tube. After placement, the tip of the gastrostomy tube lies in the body of the stomach. IMPRESSION: Percutaneous gastrostomy with placement of a 20-French bumper retention tube in the body of the stomach. This tube can be used for percutaneous feeds beginning in 24 hours after placement. Prior to gastrostomy tube placement, additional IV access was performed with ultrasound guidance in the right upper arm at the level of the basilic vein. Fluoroscopy demonstrates coiling of a pre-existing PICC line at the base of the left jugular vein. This PICC line was occluded and could not be utilized. Electronically Signed   By: Aletta Edouard M.D.   On: 12/07/2016 12:26   Ir US Guide Vasc Access Right  Result Date: 12/07/2016 CLINICAL DATA:  Respiratory failure and need for gastrostomy tube for nutritional purposes. EXAM: ULTRASOUND GUIDANCE FOR VASCULAR ACCESS AND IV CATHETER PLACEMENT PERCUTANEOUS GASTROSTOMY  TUBE PLACEMENT ANESTHESIA/SEDATION: 1.0 mg IV Versed; 50 mcg IV Fentanyl. Total Moderate Sedation Time 23 minutes. CONTRAST:  20 mL Isovue-300 MEDICATIONS: 1 g IV vancomycin. IV antibiotic was administered in an appropriate time interval prior to needle puncture of the skin. FLUOROSCOPY TIME:  13 minutes and 24 seconds.  413.6 mGy. PROCEDURE: The procedure, risks, benefits, and alternatives were explained to the patient's wife. Questions regarding the procedure were encouraged and answered. The patient's wife understands and consents to the procedure. Initial assessment of an indwelling left arm PICC line was performed. Fluoroscopy was then performed to check PICC line positioning. Additional IV access was needed for  vancomycin administration and IV conscious sedation. The right upper arm was prepped with chlorhexidine and draped. Ultrasound was used to assess venous patency. Under direct ultrasound guidance, the right basilic vein was punctured with a 21 gauge needle. A guidewire was advanced through the needle. A 4 French micropuncture dilator was then advanced over the wire. This was utilized for intravenous access during the procedure. A 5-French catheter was then advanced through the the patient's mouth under fluoroscopy into the esophagus and to the level of the stomach. This catheter was used to insufflate the stomach with air under fluoroscopy. The abdominal wall was prepped with Betadine in a sterile fashion, and a sterile drape was applied covering the operative field. A sterile gown and sterile gloves were used for the procedure. Local anesthesia was provided with 1% Lidocaine. A skin incision was made in the upper abdominal wall. Under fluoroscopy, an 18 gauge trocar needle was advanced into the stomach. Contrast injection was performed to confirm intraluminal position of the needle tip. A single T tack was then deployed in the lumen of the stomach. This was brought up to tension at the skin surface. Over a guidewire, a 9-French sheath was advanced into the lumen of the stomach. The wire was left in place as a safety wire. A loop snare device from a percutaneous gastrostomy kit was then advanced into the stomach. A floppy guide wire was advanced through the orogastric catheter under fluoroscopy in the stomach. The loop snare advanced through the percutaneous gastric access was used to snare the guide wire. This allowed withdrawal of the loop snare out of the patient's mouth by retraction of the orogastric catheter and wire. A 20-French bumper retention gastrostomy tube was looped around the snare device. It was then pulled back through the patient's mouth. The retention bumper was brought up to the anterior gastric  wall. The T tack suture was cut at the skin. The exiting gastrostomy tube was cut to appropriate length and a feeding adapter applied. The catheter was injected with contrast material to confirm position and a fluoroscopic spot image saved. The tube was then flushed with saline. A dressing was applied over the gastrostomy exit site. COMPLICATIONS: None. FINDINGS: A pre-existing PICC line was non functioning and occluded when connected to an IV palm. Fluoroscopy demonstrates coiling of the distal portion of the catheter at the base of the left internal jugular vein. New IV access was therefore necessary to proceed with IV antibiotic administration and sedation. Successful access was established in the right basilic vein above the elbow. The stomach distended well with air allowing safe placement of the gastrostomy tube. After placement, the tip of the gastrostomy tube lies in the body of the stomach. IMPRESSION: Percutaneous gastrostomy with placement of a 20-French bumper retention tube in the body of the stomach. This tube can be used for percutaneous  feeds beginning in 24 hours after placement. Prior to gastrostomy tube placement, additional IV access was performed with ultrasound guidance in the right upper arm at the level of the basilic vein. Fluoroscopy demonstrates coiling of a pre-existing PICC line at the base of the left jugular vein. This PICC line was occluded and could not be utilized. Electronically Signed   By: Aletta Edouard M.D.   On: 12/07/2016 12:26   Ir Radiology Peripheral Guided Iv Start  Result Date: 12/07/2016 CLINICAL DATA:  Respiratory failure and need for gastrostomy tube for nutritional purposes. EXAM: ULTRASOUND GUIDANCE FOR VASCULAR ACCESS AND IV CATHETER PLACEMENT PERCUTANEOUS GASTROSTOMY TUBE PLACEMENT ANESTHESIA/SEDATION: 1.0 mg IV Versed; 50 mcg IV Fentanyl. Total Moderate Sedation Time 23 minutes. CONTRAST:  20 mL Isovue-300 MEDICATIONS: 1 g IV vancomycin. IV antibiotic was  administered in an appropriate time interval prior to needle puncture of the skin. FLUOROSCOPY TIME:  13 minutes and 24 seconds.  413.6 mGy. PROCEDURE: The procedure, risks, benefits, and alternatives were explained to the patient's wife. Questions regarding the procedure were encouraged and answered. The patient's wife understands and consents to the procedure. Initial assessment of an indwelling left arm PICC line was performed. Fluoroscopy was then performed to check PICC line positioning. Additional IV access was needed for vancomycin administration and IV conscious sedation. The right upper arm was prepped with chlorhexidine and draped. Ultrasound was used to assess venous patency. Under direct ultrasound guidance, the right basilic vein was punctured with a 21 gauge needle. A guidewire was advanced through the needle. A 4 French micropuncture dilator was then advanced over the wire. This was utilized for intravenous access during the procedure. A 5-French catheter was then advanced through the the patient's mouth under fluoroscopy into the esophagus and to the level of the stomach. This catheter was used to insufflate the stomach with air under fluoroscopy. The abdominal wall was prepped with Betadine in a sterile fashion, and a sterile drape was applied covering the operative field. A sterile gown and sterile gloves were used for the procedure. Local anesthesia was provided with 1% Lidocaine. A skin incision was made in the upper abdominal wall. Under fluoroscopy, an 18 gauge trocar needle was advanced into the stomach. Contrast injection was performed to confirm intraluminal position of the needle tip. A single T tack was then deployed in the lumen of the stomach. This was brought up to tension at the skin surface. Over a guidewire, a 9-French sheath was advanced into the lumen of the stomach. The wire was left in place as a safety wire. A loop snare device from a percutaneous gastrostomy kit was then  advanced into the stomach. A floppy guide wire was advanced through the orogastric catheter under fluoroscopy in the stomach. The loop snare advanced through the percutaneous gastric access was used to snare the guide wire. This allowed withdrawal of the loop snare out of the patient's mouth by retraction of the orogastric catheter and wire. A 20-French bumper retention gastrostomy tube was looped around the snare device. It was then pulled back through the patient's mouth. The retention bumper was brought up to the anterior gastric wall. The T tack suture was cut at the skin. The exiting gastrostomy tube was cut to appropriate length and a feeding adapter applied. The catheter was injected with contrast material to confirm position and a fluoroscopic spot image saved. The tube was then flushed with saline. A dressing was applied over the gastrostomy exit site. COMPLICATIONS: None. FINDINGS: A pre-existing PICC  line was non functioning and occluded when connected to an IV palm. Fluoroscopy demonstrates coiling of the distal portion of the catheter at the base of the left internal jugular vein. New IV access was therefore necessary to proceed with IV antibiotic administration and sedation. Successful access was established in the right basilic vein above the elbow. The stomach distended well with air allowing safe placement of the gastrostomy tube. After placement, the tip of the gastrostomy tube lies in the body of the stomach. IMPRESSION: Percutaneous gastrostomy with placement of a 20-French bumper retention tube in the body of the stomach. This tube can be used for percutaneous feeds beginning in 24 hours after placement. Prior to gastrostomy tube placement, additional IV access was performed with ultrasound guidance in the right upper arm at the level of the basilic vein. Fluoroscopy demonstrates coiling of a pre-existing PICC line at the base of the left jugular vein. This PICC line was occluded and could not  be utilized. Electronically Signed   By: Aletta Edouard M.D.   On: 12/07/2016 12:26    Labs:  CBC:  Recent Labs  11/17/16 0410 11/18/16 0520 11/23/16 0909 11/30/16 0529  WBC 8.0 6.4 14.1* 6.5  HGB 9.7* 8.0* 11.0* 9.8*  HCT 34.3* 28.1* 38.0* 33.2*  PLT 194 166 257 210    COAGS:  Recent Labs  11/01/16 0415 11/02/16 0400 11/03/16 0449 11/04/16 0400  12/03/16 0647 12/04/16 1231 12/07/16 0659 12/08/16 0500  INR 2.40 2.61 2.52  --   < > 2.21 1.56 1.55 1.77  APTT 65* 78* 75* 73*  --   --   --   --   --   < > = values in this interval not displayed.  BMP:  Recent Labs  11/23/16 1232  11/26/16 0614 11/29/16 1630 11/30/16 0529 12/01/16 0415  NA 150*  --  146* 140 142  --   K 3.4*  < > 4.0 3.6 3.4* 3.3*  CL 121*  --  115* 110 109  --   CO2 24  --  '27 26 29  ' --   GLUCOSE 134*  --  146* 116* 110*  --   BUN 27*  --  23* 19 18  --   CALCIUM 7.5*  --  7.6* 8.0* 7.6*  --   CREATININE 0.70  --  0.69 0.61 0.60*  --   GFRNONAA >60  --  >60 >60 >60  --   GFRAA >60  --  >60 >60 >60  --   < > = values in this interval not displayed.  LIVER FUNCTION TESTS:  Recent Labs  11/05/16 0500 11/07/16 0440 11/09/16 0432 11/16/16 0346 11/20/16 1730 11/29/16 1630  BILITOT 1.0 1.2 0.9 1.0  --   --   AST 46* 37 22 50*  --   --   ALT 55 39 27 33  --   --   ALKPHOS 111 87 73 107  --   --   PROT 5.0* 4.8* 4.9* 6.6  --   --   ALBUMIN 1.9* 1.6* 1.6* 2.1* 1.9* 2.0*    Assessment and Plan:  S/P Perc G-tube placement yesterday by Dr. Kathlene Cote.  Ok to start tube feeds.  Routine G-tube care.  Electronically Signed: Murrell Redden PA-C 12/08/2016, 10:32 AM   I spent a total of 15 Minutes at the the patient's bedside AND on the patient's hospital floor or unit, greater than 50% of which was counseling/coordinating care for f/u g-tube

## 2016-12-10 DIAGNOSIS — R6889 Other general symptoms and signs: Secondary | ICD-10-CM

## 2016-12-10 DIAGNOSIS — I82603 Acute embolism and thrombosis of unspecified veins of upper extremity, bilateral: Secondary | ICD-10-CM

## 2016-12-10 DIAGNOSIS — I96 Gangrene, not elsewhere classified: Secondary | ICD-10-CM

## 2016-12-10 DIAGNOSIS — D7582 Heparin induced thrombocytopenia (HIT): Secondary | ICD-10-CM

## 2016-12-10 LAB — CBC WITH DIFFERENTIAL/PLATELET
BASOS ABS: 0 10*3/uL (ref 0.0–0.1)
Basophils Relative: 0 %
EOS PCT: 1 %
Eosinophils Absolute: 0.1 10*3/uL (ref 0.0–0.7)
HEMATOCRIT: 37.2 % — AB (ref 39.0–52.0)
Hemoglobin: 10.6 g/dL — ABNORMAL LOW (ref 13.0–17.0)
LYMPHS ABS: 1.1 10*3/uL (ref 0.7–4.0)
LYMPHS PCT: 11 %
MCH: 26.5 pg (ref 26.0–34.0)
MCHC: 28.5 g/dL — AB (ref 30.0–36.0)
MCV: 93 fL (ref 78.0–100.0)
MONO ABS: 0.5 10*3/uL (ref 0.1–1.0)
MONOS PCT: 5 %
NEUTROS ABS: 7.8 10*3/uL — AB (ref 1.7–7.7)
Neutrophils Relative %: 83 %
PLATELETS: 150 10*3/uL (ref 150–400)
RBC: 4 MIL/uL — ABNORMAL LOW (ref 4.22–5.81)
RDW: 16.9 % — AB (ref 11.5–15.5)
WBC: 9.5 10*3/uL (ref 4.0–10.5)

## 2016-12-10 LAB — URINALYSIS, ROUTINE W REFLEX MICROSCOPIC
Bilirubin Urine: NEGATIVE
GLUCOSE, UA: NEGATIVE mg/dL
Ketones, ur: NEGATIVE mg/dL
NITRITE: NEGATIVE
PH: 9 — AB (ref 5.0–8.0)
Protein, ur: 100 mg/dL — AB
Specific Gravity, Urine: 1.015 (ref 1.005–1.030)
Squamous Epithelial / HPF: NONE SEEN

## 2016-12-10 LAB — COMPREHENSIVE METABOLIC PANEL
ALT: 35 U/L (ref 17–63)
AST: 44 U/L — AB (ref 15–41)
Albumin: 2.3 g/dL — ABNORMAL LOW (ref 3.5–5.0)
Alkaline Phosphatase: 190 U/L — ABNORMAL HIGH (ref 38–126)
Anion gap: 12 (ref 5–15)
BILIRUBIN TOTAL: 0.7 mg/dL (ref 0.3–1.2)
BUN: 24 mg/dL — AB (ref 6–20)
CHLORIDE: 96 mmol/L — AB (ref 101–111)
CO2: 39 mmol/L — ABNORMAL HIGH (ref 22–32)
Calcium: 8.5 mg/dL — ABNORMAL LOW (ref 8.9–10.3)
Creatinine, Ser: 0.77 mg/dL (ref 0.61–1.24)
Glucose, Bld: 187 mg/dL — ABNORMAL HIGH (ref 65–99)
POTASSIUM: 2.6 mmol/L — AB (ref 3.5–5.1)
Sodium: 147 mmol/L — ABNORMAL HIGH (ref 135–145)
TOTAL PROTEIN: 6.4 g/dL — AB (ref 6.5–8.1)

## 2016-12-10 LAB — PROTIME-INR
INR: 1.48
PROTHROMBIN TIME: 18 s — AB (ref 11.4–15.2)

## 2016-12-10 LAB — POTASSIUM: Potassium: 3.6 mmol/L (ref 3.5–5.1)

## 2016-12-10 NOTE — Progress Notes (Signed)
Patient ID: Scott [name], male   DOB: 01-12-1946, 71 y.o.   MRN: [id number] Patient is alert and oriented this morning. He has a low-grade temperature. Examination of both feet he does have cellulitis and what gangrene of both feet up to the ankle bilaterally. There is no ascending cellulitis there is no signs of infection in the calf. Examination of both hands he has dry gangrene of all digits except for the right thumb. Gangrene up to the MCP joint bilaterally.  I had a long discussion with the patient recommended proceeding urgently with surgery due to the cellulitis and low-grade temperature. Recommended bilateral transtibial amputations with amputation of the gangrenous fingers on both hands at the MCP joint except for the right thumb. Risks and benefits were discussed including potential for sepsis if we wait. Patient states he would like to wait until next week. I called the patient's wife she wants to proceed with surgery for tomorrow. I will post the patient for surgery tomorrow afternoon and will cancel surgery if patient agreed to proceed with surgery tomorrow. Will need to be nothing by mouth after midnight through his feeding tube.

## 2016-12-11 ENCOUNTER — Institutional Professional Consult (permissible substitution) (HOSPITAL_COMMUNITY): Payer: Self-pay | Admitting: Certified Registered Nurse Anesthetist

## 2016-12-11 ENCOUNTER — Other Ambulatory Visit (HOSPITAL_COMMUNITY): Payer: Self-pay

## 2016-12-11 ENCOUNTER — Encounter (HOSPITAL_COMMUNITY): Payer: Self-pay | Admitting: Certified Registered Nurse Anesthetist

## 2016-12-11 ENCOUNTER — Encounter
Admission: AD | Disposition: A | Discharge status: Rehabilitation Facility | Payer: Self-pay | Source: Ambulatory Visit | Attending: Internal Medicine

## 2016-12-11 ENCOUNTER — Encounter: Payer: Self-pay | Admitting: Anesthesiology

## 2016-12-11 DIAGNOSIS — Z452 Encounter for adjustment and management of vascular access device: Secondary | ICD-10-CM | POA: Diagnosis not present

## 2016-12-11 DIAGNOSIS — J811 Chronic pulmonary edema: Secondary | ICD-10-CM | POA: Diagnosis not present

## 2016-12-11 DIAGNOSIS — I96 Gangrene, not elsewhere classified: Secondary | ICD-10-CM

## 2016-12-11 DIAGNOSIS — D65 Disseminated intravascular coagulation [defibrination syndrome]: Secondary | ICD-10-CM

## 2016-12-11 HISTORY — PX: AMPUTATION: SHX166

## 2016-12-11 LAB — VANCOMYCIN, TROUGH: VANCOMYCIN TR: 30 ug/mL — AB (ref 15–20)

## 2016-12-11 LAB — URINE CULTURE: Culture: NO GROWTH

## 2016-12-11 SURGERY — AMPUTATION BELOW KNEE
Anesthesia: General | Laterality: Bilateral

## 2016-12-11 MED ORDER — MIDAZOLAM HCL 2 MG/2ML IJ SOLN
INTRAMUSCULAR | Status: DC | PRN
  Administered 2016-12-11: 4 mg via INTRAVENOUS

## 2016-12-11 MED ORDER — PHENYLEPHRINE 40 MCG/ML (10ML) SYRINGE FOR IV PUSH (FOR BLOOD PRESSURE SUPPORT)
PREFILLED_SYRINGE | INTRAVENOUS | Status: AC
Start: 2016-12-11 — End: 2016-12-11
  Filled 2016-12-11: qty 10

## 2016-12-11 MED ORDER — ROCURONIUM BROMIDE 50 MG/5ML IV SOSY
PREFILLED_SYRINGE | INTRAVENOUS | Status: AC
  Filled 2016-12-11: qty 5

## 2016-12-11 MED ORDER — EPHEDRINE 5 MG/ML INJ
INTRAVENOUS | Status: AC
  Filled 2016-12-11: qty 10

## 2016-12-11 MED ORDER — PHENYLEPHRINE 40 MCG/ML (10ML) SYRINGE FOR IV PUSH (FOR BLOOD PRESSURE SUPPORT)
PREFILLED_SYRINGE | INTRAVENOUS | Status: AC
  Filled 2016-12-11: qty 10

## 2016-12-11 MED ORDER — LACTATED RINGERS IV SOLN
INTRAVENOUS | Status: DC | PRN
  Administered 2016-12-11: 15:00:00 via INTRAVENOUS

## 2016-12-11 MED ORDER — SUGAMMADEX SODIUM 200 MG/2ML IV SOLN
INTRAVENOUS | Status: DC | PRN
  Administered 2016-12-11: 400 mg via INTRAVENOUS

## 2016-12-11 MED ORDER — 0.9 % SODIUM CHLORIDE (POUR BTL) OPTIME
TOPICAL | Status: DC | PRN
  Administered 2016-12-11: 1000 mL

## 2016-12-11 MED ORDER — PROPOFOL 10 MG/ML IV BOLUS
INTRAVENOUS | Status: AC
  Filled 2016-12-11: qty 20

## 2016-12-11 MED ORDER — ROCURONIUM 10MG/ML (10ML) SYRINGE FOR MEDFUSION PUMP - OPTIME
INTRAVENOUS | Status: DC | PRN
  Administered 2016-12-11 (×2): 50 mg via INTRAVENOUS

## 2016-12-11 MED ORDER — ALBUMIN HUMAN 5 % IV SOLN
INTRAVENOUS | Status: DC | PRN
  Administered 2016-12-11: 16:00:00 via INTRAVENOUS

## 2016-12-11 MED ORDER — FENTANYL CITRATE (PF) 100 MCG/2ML IJ SOLN
INTRAMUSCULAR | Status: DC | PRN
  Administered 2016-12-11: 100 ug via INTRAVENOUS
  Administered 2016-12-11 (×2): 50 ug via INTRAVENOUS

## 2016-12-11 MED ORDER — LIDOCAINE 2% (20 MG/ML) 5 ML SYRINGE
INTRAMUSCULAR | Status: AC
  Filled 2016-12-11: qty 5

## 2016-12-11 MED ORDER — SUGAMMADEX SODIUM 200 MG/2ML IV SOLN
INTRAVENOUS | Status: AC
  Filled 2016-12-11: qty 2

## 2016-12-11 MED ORDER — TRANEXAMIC ACID 1000 MG/10ML IV SOLN
2000.0000 mg | Freq: Once | INTRAVENOUS | Status: DC
  Filled 2016-12-11: qty 20

## 2016-12-11 MED ORDER — SODIUM CHLORIDE 0.9 % IV SOLN
INTRAVENOUS | Status: DC | PRN
  Administered 2016-12-11: 4000 mg via INTRAVENOUS

## 2016-12-11 MED ORDER — PHENYLEPHRINE HCL 10 MG/ML IJ SOLN
INTRAMUSCULAR | Status: DC | PRN
  Administered 2016-12-11: 50 ug/min via INTRAVENOUS

## 2016-12-11 MED ORDER — FENTANYL CITRATE (PF) 100 MCG/2ML IJ SOLN
INTRAMUSCULAR | Status: AC
  Filled 2016-12-11: qty 4

## 2016-12-11 MED ORDER — PHENYLEPHRINE HCL 10 MG/ML IJ SOLN
INTRAMUSCULAR | Status: DC | PRN
  Administered 2016-12-11: 200 ug via INTRAVENOUS

## 2016-12-11 MED ORDER — MIDAZOLAM HCL 2 MG/2ML IJ SOLN
INTRAMUSCULAR | Status: AC
  Filled 2016-12-11: qty 4

## 2016-12-11 MED ORDER — ONDANSETRON HCL 4 MG/2ML IJ SOLN
INTRAMUSCULAR | Status: AC
Start: 2016-12-11 — End: 2016-12-11
  Filled 2016-12-11: qty 2

## 2016-12-11 SURGICAL SUPPLY — 65 items
BANDAGE ESMARK 6X9 LF (GAUZE/BANDAGES/DRESSINGS) IMPLANT
BLADE SAW RECIP 87.9 MT (BLADE) ×5 IMPLANT
BLADE SURG 21 STRL SS (BLADE) ×11 IMPLANT
BNDG CMPR 9X4 STRL LF SNTH (GAUZE/BANDAGES/DRESSINGS) ×1
BNDG CMPR 9X6 STRL LF SNTH (GAUZE/BANDAGES/DRESSINGS) ×1
BNDG COHESIVE 4X5 TAN STRL (GAUZE/BANDAGES/DRESSINGS) ×5 IMPLANT
BNDG COHESIVE 6X5 TAN STRL LF (GAUZE/BANDAGES/DRESSINGS) ×4 IMPLANT
BNDG ESMARK 4X9 LF (GAUZE/BANDAGES/DRESSINGS) ×2 IMPLANT
BNDG ESMARK 6X9 LF (GAUZE/BANDAGES/DRESSINGS) ×3
BNDG GAUZE ELAST 4 BULKY (GAUZE/BANDAGES/DRESSINGS) ×4 IMPLANT
CANISTER WOUND CARE 500ML ATS (WOUND CARE) ×4 IMPLANT
COVER MAYO STAND STRL (DRAPES) ×2 IMPLANT
COVER SURGICAL LIGHT HANDLE (MISCELLANEOUS) ×6 IMPLANT
CUFF TOURNIQUET SINGLE 34IN LL (TOURNIQUET CUFF) ×4 IMPLANT
DRAPE EXTREMITY BILATERAL (DRAPES) ×2 IMPLANT
DRAPE EXTREMITY T 121X128X90 (DRAPE) ×2 IMPLANT
DRAPE HALF SHEET 40X57 (DRAPES) ×6 IMPLANT
DRAPE INCISE IOBAN 66X45 STRL (DRAPES) ×8 IMPLANT
DRAPE ORTHO SPLIT 77X108 STRL (DRAPES) ×6
DRAPE SURG ORHT 6 SPLT 77X108 (DRAPES) IMPLANT
DRAPE U-SHAPE 47X51 STRL (DRAPES) ×5 IMPLANT
DRSG ADAPTIC 3X8 NADH LF (GAUZE/BANDAGES/DRESSINGS) ×4 IMPLANT
DRSG VAC ATS LRG SENSATRAC (GAUZE/BANDAGES/DRESSINGS) ×2 IMPLANT
DURAPREP 26ML APPLICATOR (WOUND CARE) ×9 IMPLANT
ELECT REM PT RETURN 9FT ADLT (ELECTROSURGICAL) ×3
ELECTRODE REM PT RTRN 9FT ADLT (ELECTROSURGICAL) ×1 IMPLANT
GLOVE BIOGEL PI IND STRL 6.5 (GLOVE) IMPLANT
GLOVE BIOGEL PI IND STRL 7.5 (GLOVE) IMPLANT
GLOVE BIOGEL PI IND STRL 9 (GLOVE) ×1 IMPLANT
GLOVE BIOGEL PI INDICATOR 6.5 (GLOVE) ×2
GLOVE BIOGEL PI INDICATOR 7.5 (GLOVE) ×2
GLOVE BIOGEL PI INDICATOR 9 (GLOVE) ×2
GLOVE BIOGEL PI ORTHO PRO SZ7 (GLOVE) ×2
GLOVE ECLIPSE 7.0 STRL STRAW (GLOVE) ×4 IMPLANT
GLOVE PI ORTHO PRO STRL SZ7 (GLOVE) IMPLANT
GLOVE SURG ORTHO 9.0 STRL STRW (GLOVE) ×3 IMPLANT
GLOVE SURG SS PI 6.5 STRL IVOR (GLOVE) ×4 IMPLANT
GOWN STRL REUS W/ TWL LRG LVL3 (GOWN DISPOSABLE) IMPLANT
GOWN STRL REUS W/ TWL XL LVL3 (GOWN DISPOSABLE) ×2 IMPLANT
GOWN STRL REUS W/TWL LRG LVL3 (GOWN DISPOSABLE) ×3
GOWN STRL REUS W/TWL XL LVL3 (GOWN DISPOSABLE) ×6
KIT BASIN OR (CUSTOM PROCEDURE TRAY) ×3 IMPLANT
KIT ROOM TURNOVER OR (KITS) ×3 IMPLANT
MANIFOLD NEPTUNE II (INSTRUMENTS) ×3 IMPLANT
NDL SPNL 18GX3.5 QUINCKE PK (NEEDLE) IMPLANT
NEEDLE SPNL 18GX3.5 QUINCKE PK (NEEDLE) ×3 IMPLANT
NS IRRIG 1000ML POUR BTL (IV SOLUTION) ×3 IMPLANT
PACK ORTHO EXTREMITY (CUSTOM PROCEDURE TRAY) ×3 IMPLANT
PAD ARMBOARD 7.5X6 YLW CONV (MISCELLANEOUS) ×6 IMPLANT
PAD NEG PRESSURE SENSATRAC (MISCELLANEOUS) ×2 IMPLANT
SPONGE GAUZE 4X4 12PLY STER LF (GAUZE/BANDAGES/DRESSINGS) ×4 IMPLANT
SPONGE LAP 18X18 X RAY DECT (DISPOSABLE) ×4 IMPLANT
STAPLER VISISTAT 35W (STAPLE) ×4 IMPLANT
STOCKINETTE 6  STRL (DRAPES) ×2
STOCKINETTE 6 STRL (DRAPES) IMPLANT
STOCKINETTE IMPERVIOUS LG (DRAPES) ×5 IMPLANT
SUT ETHILON 2 0 PSLX (SUTURE) ×15 IMPLANT
SUT SILK 2 0 (SUTURE) ×6
SUT SILK 2-0 18XBRD TIE 12 (SUTURE) ×1 IMPLANT
SUT VIC AB 1 CTX 27 (SUTURE) ×8 IMPLANT
SYRINGE 20CC LL (MISCELLANEOUS) ×2 IMPLANT
TOWEL OR 17X26 10 PK STRL BLUE (TOWEL DISPOSABLE) ×3 IMPLANT
TUBE CONNECTING 12'X1/4 (SUCTIONS) ×1
TUBE CONNECTING 12X1/4 (SUCTIONS) ×1 IMPLANT
YANKAUER SUCT BULB TIP NO VENT (SUCTIONS) ×2 IMPLANT

## 2016-12-11 NOTE — Anesthesia Postprocedure Evaluation (Addendum)
Anesthesia Post Note  Patient: Scott [name]  Procedure(s) Performed: Procedure(s) (LRB): AMPUTATION BELOW KNEE BILATERALLY (Bilateral) AMPUTATION BILATERAL HANDS EXCEPT RIGHT THUMB (Bilateral)  Patient location during evaluation: PACU Anesthesia Type: General Level of consciousness: sedated Vital Signs Assessment: post-procedure vital signs reviewed and stable Respiratory status: Ventilator via Trach. Cardiovascular status: stable Anesthetic complications: no       Last Vitals:  Vitals:   12/07/16 1045 12/07/16 1054  BP: 115/88 103/73  Pulse: 79 83  Resp: 13 13    Last Pain:  Vitals:   12/07/16 0930  PainSc: Nash Yusef Lamp

## 2016-12-11 NOTE — Op Note (Signed)
Date of Surgery: 12/11/2016  INDICATIONS: Scott [name] is a 71 y.o.-year-old male who underwent open heart surgery with complications secondary to heparin-induced thrombocytopenia thrombosis. [facility] has started to become septic from the gangrenous changes to both feet and gangrenous changes to both hands and presents at this time urgently for the following surgeries. 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  POSTOPERATIVE DIAGNOSIS: Same.  PROCEDURE: Transtibial amputation bilateral Application of Prevena wound VAC bilateral transtibial amputations. Left transmetatarsal carpal amputation left hand. Right MCP amputation right hand Injection bilateral transtibial amputations with TXA 50 mL per amputation  SURGEON: Sharol Given, M.D.  ANESTHESIA:  general  IV FLUIDS AND URINE: See anesthesia.  ESTIMATED BLOOD LOSS: Minimal mL.  COMPLICATIONS: None.  DESCRIPTION OF PROCEDURE: The patient was brought to the operating room and underwent a general anesthetic. Using the tracheostomy. After adequate levels of anesthesia were obtained patient's bilateral lower extremity and bilateral upper extremity was prepped using DuraPrep draped into a sterile field. A timeout was called. The feet were draped out of the sterile field with impervious stockinette.  The procedures were started on the left lower extremity.   A transverse incision was made 11 cm distal to the tibial tubercle. This curved proximally and a large posterior flap was created. The tibia was transected 1 cm proximal to the skin incision. The fibula was transected just proximal to the tibial incision. The tibia was beveled anteriorly. A large posterior flap was created. The sciatic nerve was pulled cut and allowed to retract. The vascular  bundles were suture ligated with 2-0 silk. The deep and superficial fascial layers were closed using #1 Vicryl. The skin was closed using staples and 2-0 nylon. The wound was covered with a Prevena wound VAC. There was a good suction fit.   Attention was then focused to the right lower extremity  A transverse incision was made 11 cm distal to the tibial tubercle. This curved proximally and a large posterior flap was created. The tibia was transected 1 cm proximal to the skin incision. The fibula was transected just proximal to the tibial incision. The tibia was beveled anteriorly. A large posterior flap was created. The sciatic nerve was pulled cut and allowed to retract. The vascular bundles were suture ligated with 2-0 silk. The deep and superficial fascial layers were closed using #1 Vicryl. The skin was closed using staples and 2-0 nylon. The wound was covered with a Prevena wound VAC. There was a good suction fit.   Attention was then focused on the left upper extremity. A incision was made just proximal to the gangrenous changes approximately 1 cm proximal to the black dry gangrene. This required an amputation through all of the metacarpals. Electrocautery was used for hemostasis. The wound was irrigated with normal saline and incision was closed using 2-0 nylon.  Attention was then focused on the right upper extremity.  The right thumb was viable and an amputation was performed through the MCP joint for the index to little finger. This incision was approximately 1 cm proximal to the black gangrenous skin. Electrocautery was used for hemostasis. The incision was closed using 2-0 nylon. Sterile compressive dressings were applied to both upper extremities.  There was a good suction fit for the wound VAC for both lower extremities. [facility]   Meridee Score, MD [place] 4:23 PM

## 2016-12-11 NOTE — Anesthesia Procedure Notes (Signed)
Date/Time: 12/11/2016 2:40 PM Performed by: Lance Coon Pre-anesthesia Checklist: Patient identified, Emergency Drugs available, Suction available, Patient being monitored and Timeout performed Patient Re-evaluated:Patient Re-evaluated prior to inductionOxygen Delivery Method: Circle system utilized Preoxygenation: Pre-oxygenation with 100% oxygen Intubation Type: Inhalational induction Placement Confirmation: breath sounds checked- equal and bilateral and positive ETCO2 Comments: Pt arrived with indwelling 6.0 cuffed trach

## 2016-12-11 NOTE — Anesthesia Preprocedure Evaluation (Addendum)
Anesthesia Evaluation  Patient identified by MRN, date of birth, ID band Patient awake    Reviewed: Allergy & Precautions, NPO status , Patient's Chart, lab work & pertinent test results, reviewed documented beta blocker date and time   Airway Mallampati: [place]  (+) Dental Advisory Given   Pulmonary former smoker,    breath sounds clear to auscultation       Cardiovascular hypertension, Pt. on medications and Pt. on home beta blockers + CAD, + CABG, + Peripheral Vascular Disease and +CHF  + Valvular Problems/Murmurs  Rhythm:Regular Rate:Normal  S/p AVR, MVR, CABx3   Neuro/Psych PSYCHIATRIC DISORDERS  Neuromuscular disease    GI/Hepatic GERD  ,  Endo/Other  diabetes, Type 2, Oral Hypoglycemic Agents  Renal/GU   negative genitourinary   Musculoskeletal  (+) Arthritis , Osteoarthritis,    Abdominal   Peds negative pediatric ROS (+)  Hematology negative hematology ROS (+)   Anesthesia Other Findings   Reproductive/Obstetrics negative OB ROS                           Lab Results  Component Value Date   WBC 9.5 12/10/2016   HGB 10.6 (L) 12/10/2016   HCT 37.2 (L) 12/10/2016   MCV 93.0 12/10/2016   PLT 150 12/10/2016   Lab Results  Component Value Date   CREATININE 0.77 12/10/2016   BUN 24 (H) 12/10/2016   NA 147 (H) 12/10/2016   K 3.6 12/10/2016   CL 96 (L) 12/10/2016   CO2 39 (H) 12/10/2016   Lab Results  Component Value Date   INR 1.48 12/10/2016   INR 1.77 12/08/2016   INR 1.55 12/07/2016   EKG: atrial fibrillation.  Echo: - Left ventricle: The cavity size was normal. There was moderate   concentric hypertrophy. Systolic function was normal. The   estimated ejection fraction was in the range of 55% to 60%. Wall   motion was normal; there were no regional wall motion   abnormalities. The study is not technically sufficient to allow   evaluation of LV diastolic  function. - Aortic valve: There was mild regurgitation. - Mitral valve: Calcified annulus. Mildly thickened leaflets .   There was mild regurgitation. - Left atrium: The atrium was mildly dilated.     Anesthesia Physical Anesthesia Plan  ASA: IV  Anesthesia Plan: General   Post-op Pain Management:    Induction: Inhalational  Airway Management Planned: Tracheostomy  Additional Equipment:   Intra-op Plan:   Post-operative Plan: Post-operative intubation/ventilation  Informed Consent: I have reviewed the patients History and Physical, chart, labs and discussed the procedure including the risks, benefits and alternatives for the proposed anesthesia with the patient or authorized representative who has indicated his/her understanding and acceptance.     Plan Discussed with: CRNA  Anesthesia Plan Comments:         Anesthesia Quick Evaluation

## 2016-12-11 NOTE — Interval H&P Note (Signed)
History and Physical Interval Note:  12/11/2016 6:53 AM  Scott [name]  has presented today for surgery, with the diagnosis of Gangrene Bilateral UE and LE   The various methods of treatment have been discussed with the patient and family. After consideration of risks, benefits and other options for treatment, the patient has consented to  Procedure(s): AMPUTATION BELOW KNEE BILATERALLY (Bilateral) AMPUTATION BILATERAL HANDS EXCEPT RIGHT THUMB (Bilateral) as a surgical intervention .  The patient's history has been reviewed, patient examined, no change in status, stable for surgery.  I have reviewed the patient's chart and labs.  Questions were answered to the patient's satisfaction.     [name]

## 2016-12-11 NOTE — Transfer of Care (Signed)
Immediate Anesthesia Transfer of Care Note  Patient: Scott [name]  Procedure(s) Performed: Procedure(s): AMPUTATION BELOW KNEE BILATERALLY (Bilateral) AMPUTATION BILATERAL HANDS EXCEPT RIGHT THUMB (Bilateral)  [facility] Location[facility]  Anesthesia Type:General  Level of Consciousness: sedated and patient cooperative  Airway & Oxygen Therapy: Patient remains intubated per anesthesia plan and Patient placed on Ventilator (see vital sign flow sheet for setting)  Post-op Assessment: Report given to RN and Post -op Vital signs reviewed and stable  Post vital signs: Reviewed and stable  Last Vitals:  Vitals:   12/07/16 1045 12/07/16 1054  BP: 115/88 103/73  Pulse: 79 83  Resp: 13 13    Last Pain:  Vitals:   12/07/16 0930  PainSc: 5          Complications: No apparent anesthesia complications

## 2016-12-11 NOTE — H&P (View-Only) (Signed)
Patient ID: Scott [name], male   DOB: May 30, 1946, 71 y.o.   MRN: [id number] Patient is alert and oriented this morning. He has a low-grade temperature. Examination of both feet he does have cellulitis and what gangrene of both feet up to the ankle bilaterally. There is no ascending cellulitis there is no signs of infection in the calf. Examination of both hands he has dry gangrene of all digits except for the right thumb. Gangrene up to the MCP joint bilaterally.  I had a long discussion with the patient recommended proceeding urgently with surgery due to the cellulitis and low-grade temperature. Recommended bilateral transtibial amputations with amputation of the gangrenous fingers on both hands at the MCP joint except for the right thumb. Risks and benefits were discussed including potential for sepsis if we wait. Patient states he would like to wait until next week. I called the patient's wife she wants to proceed with surgery for tomorrow. I will post the patient for surgery tomorrow afternoon and will cancel surgery if patient agreed to proceed with surgery tomorrow. Will need to be nothing by mouth after midnight through his feeding tube.

## 2016-12-12 LAB — VANCOMYCIN, TROUGH: VANCOMYCIN TR: 9 ug/mL — AB (ref 15–20)

## 2016-12-13 LAB — CULTURE, RESPIRATORY W GRAM STAIN

## 2016-12-13 LAB — CULTURE, RESPIRATORY

## 2016-12-14 LAB — CBC
HEMATOCRIT: 22.2 % — AB (ref 39.0–52.0)
Hemoglobin: 6.3 g/dL — CL (ref 13.0–17.0)
MCH: 26.1 pg (ref 26.0–34.0)
MCHC: 28.4 g/dL — AB (ref 30.0–36.0)
MCV: 92.1 fL (ref 78.0–100.0)
Platelets: 177 10*3/uL (ref 150–400)
RBC: 2.41 MIL/uL — ABNORMAL LOW (ref 4.22–5.81)
RDW: 17.3 % — AB (ref 11.5–15.5)
WBC: 9.2 10*3/uL (ref 4.0–10.5)

## 2016-12-14 LAB — BASIC METABOLIC PANEL
ANION GAP: 8 (ref 5–15)
BUN: 24 mg/dL — ABNORMAL HIGH (ref 6–20)
CO2: 31 mmol/L (ref 22–32)
CREATININE: 0.79 mg/dL (ref 0.61–1.24)
Calcium: 7.4 mg/dL — ABNORMAL LOW (ref 8.9–10.3)
Chloride: 107 mmol/L (ref 101–111)
GFR calc Af Amer: >60 mL/min (ref >60)
GFR calc non Af Amer: >60 mL/min (ref >60)
GLUCOSE: 172 mg/dL — AB (ref 65–99)
Potassium: 2.9 mmol/L — ABNORMAL LOW (ref 3.5–5.1)
Sodium: 146 mmol/L — ABNORMAL HIGH (ref 135–145)

## 2016-12-14 LAB — PROTIME-INR
INR: 1.7
Prothrombin Time: 20.2 seconds — ABNORMAL HIGH (ref 11.4–15.2)

## 2016-12-14 LAB — PREPARE RBC (CROSSMATCH)

## 2016-12-14 LAB — VANCOMYCIN, TROUGH: VANCOMYCIN TR: 25 ug/mL — AB (ref 15–20)

## 2016-12-15 ENCOUNTER — Encounter (HOSPITAL_COMMUNITY): Payer: Self-pay | Admitting: Orthopedic Surgery

## 2016-12-15 LAB — CBC
HCT: 28 % — ABNORMAL LOW (ref 39.0–52.0)
Hemoglobin: 8 g/dL — ABNORMAL LOW (ref 13.0–17.0)
MCH: 25.9 pg — AB (ref 26.0–34.0)
MCHC: 28.6 g/dL — AB (ref 30.0–36.0)
MCV: 90.6 fL (ref 78.0–100.0)
Platelets: 278 10*3/uL (ref 150–400)
RBC: 3.09 MIL/uL — ABNORMAL LOW (ref 4.22–5.81)
RDW: 18.6 % — AB (ref 11.5–15.5)
WBC: 11.9 10*3/uL — ABNORMAL HIGH (ref 4.0–10.5)

## 2016-12-15 LAB — CULTURE, BLOOD (ROUTINE X 2)
CULTURE: NO GROWTH
Culture: NO GROWTH

## 2016-12-15 LAB — VANCOMYCIN, TROUGH: Vancomycin Tr: 9 ug/mL — ABNORMAL LOW (ref 15–20)

## 2016-12-15 LAB — PROTIME-INR
INR: 1.77
Prothrombin Time: 20.8 seconds — ABNORMAL HIGH (ref 11.4–15.2)

## 2016-12-16 LAB — C DIFFICILE QUICK SCREEN W PCR REFLEX
C DIFFICILE (CDIFF) INTERP: NOT DETECTED
C DIFFICLE (CDIFF) ANTIGEN: NEGATIVE
C Diff toxin: NEGATIVE

## 2016-12-16 LAB — PROTIME-INR
INR: 1.86
PROTHROMBIN TIME: 21.6 s — AB (ref 11.4–15.2)

## 2016-12-17 ENCOUNTER — Other Ambulatory Visit (HOSPITAL_COMMUNITY): Payer: Self-pay

## 2016-12-17 DIAGNOSIS — J9690 Respiratory failure, unspecified, unspecified whether with hypoxia or hypercapnia: Secondary | ICD-10-CM | POA: Diagnosis not present

## 2016-12-17 LAB — PROTIME-INR
INR: 1.71
PROTHROMBIN TIME: 20.3 s — AB (ref 11.4–15.2)

## 2016-12-18 LAB — TYPE AND SCREEN
ABO/RH(D): O POS
Antibody Screen: NEGATIVE
Unit division: 0
Unit division: 0

## 2016-12-18 LAB — CBC
HCT: 34.4 % — ABNORMAL LOW (ref 39.0–52.0)
Hemoglobin: 9.5 g/dL — ABNORMAL LOW (ref 13.0–17.0)
MCH: 26.1 pg (ref 26.0–34.0)
MCHC: 27.6 g/dL — ABNORMAL LOW (ref 30.0–36.0)
MCV: 94.5 fL (ref 78.0–100.0)
PLATELETS: 325 10*3/uL (ref 150–400)
RBC: 3.64 MIL/uL — ABNORMAL LOW (ref 4.22–5.81)
RDW: 18.4 % — AB (ref 11.5–15.5)
WBC: 8.8 10*3/uL (ref 4.0–10.5)

## 2016-12-18 LAB — BASIC METABOLIC PANEL
ANION GAP: 9 (ref 5–15)
BUN: 32 mg/dL — ABNORMAL HIGH (ref 6–20)
CALCIUM: 8.3 mg/dL — AB (ref 8.9–10.3)
CO2: 35 mmol/L — ABNORMAL HIGH (ref 22–32)
Chloride: 110 mmol/L (ref 101–111)
Creatinine, Ser: 0.66 mg/dL (ref 0.61–1.24)
Glucose, Bld: 167 mg/dL — ABNORMAL HIGH (ref 65–99)
POTASSIUM: 2.7 mmol/L — AB (ref 3.5–5.1)
SODIUM: 154 mmol/L — AB (ref 135–145)

## 2016-12-18 LAB — PROTIME-INR
INR: 1.66
PROTHROMBIN TIME: 19.8 s — AB (ref 11.4–15.2)

## 2016-12-18 LAB — PROCALCITONIN

## 2016-12-19 LAB — CULTURE, BLOOD (ROUTINE X 2)
CULTURE: NO GROWTH
Culture: NO GROWTH

## 2016-12-19 LAB — BASIC METABOLIC PANEL
Anion gap: 7 (ref 5–15)
BUN: 33 mg/dL — AB (ref 6–20)
CO2: 35 mmol/L — ABNORMAL HIGH (ref 22–32)
Calcium: 8 mg/dL — ABNORMAL LOW (ref 8.9–10.3)
Chloride: 110 mmol/L (ref 101–111)
Creatinine, Ser: 0.66 mg/dL (ref 0.61–1.24)
GFR calc Af Amer: >60 mL/min (ref >60)
Glucose, Bld: 195 mg/dL — ABNORMAL HIGH (ref 65–99)
POTASSIUM: 2.8 mmol/L — AB (ref 3.5–5.1)
Sodium: 152 mmol/L — ABNORMAL HIGH (ref 135–145)

## 2016-12-20 LAB — BASIC METABOLIC PANEL
Anion gap: 9 (ref 5–15)
Anion gap: 8 (ref 5–15)
BUN: 34 mg/dL — ABNORMAL HIGH (ref 6–20)
BUN: 33 mg/dL — ABNORMAL HIGH (ref 6–20)
CALCIUM: 7.9 mg/dL — AB (ref 8.9–10.3)
CALCIUM: 7.8 mg/dL — AB (ref 8.9–10.3)
CO2: 32 mmol/L (ref 22–32)
CO2: 31 mmol/L (ref 22–32)
CREATININE: 0.87 mg/dL (ref 0.61–1.24)
CREATININE: 0.72 mg/dL (ref 0.61–1.24)
Chloride: 107 mmol/L (ref 101–111)
Chloride: 105 mmol/L (ref 101–111)
GFR calc Af Amer: >60 mL/min (ref >60)
GFR calc non Af Amer: >60 mL/min (ref >60)
GFR calc non Af Amer: >60 mL/min (ref >60)
GLUCOSE: 155 mg/dL — AB (ref 65–99)
Glucose, Bld: 153 mg/dL — ABNORMAL HIGH (ref 65–99)
Potassium: 4.5 mmol/L (ref 3.5–5.1)
Potassium: 4.5 mmol/L (ref 3.5–5.1)
Sodium: 148 mmol/L — ABNORMAL HIGH (ref 135–145)
Sodium: 144 mmol/L (ref 135–145)

## 2016-12-20 LAB — PROTIME-INR
INR: 1.69
PROTHROMBIN TIME: 20.1 s — AB (ref 11.4–15.2)

## 2016-12-21 LAB — BASIC METABOLIC PANEL
ANION GAP: 10 (ref 5–15)
ANION GAP: 7 (ref 5–15)
Anion gap: 6 (ref 5–15)
BUN: 28 mg/dL — ABNORMAL HIGH (ref 6–20)
BUN: 26 mg/dL — ABNORMAL HIGH (ref 6–20)
BUN: 32 mg/dL — AB (ref 6–20)
CALCIUM: 8 mg/dL — AB (ref 8.9–10.3)
CALCIUM: 7.7 mg/dL — AB (ref 8.9–10.3)
CHLORIDE: 105 mmol/L (ref 101–111)
CHLORIDE: 99 mmol/L — AB (ref 101–111)
CO2: 31 mmol/L (ref 22–32)
CO2: 31 mmol/L (ref 22–32)
CO2: 32 mmol/L (ref 22–32)
CREATININE: 0.61 mg/dL (ref 0.61–1.24)
CREATININE: 0.7 mg/dL (ref 0.61–1.24)
Calcium: 8 mg/dL — ABNORMAL LOW (ref 8.9–10.3)
Chloride: 103 mmol/L (ref 101–111)
Creatinine, Ser: 0.64 mg/dL (ref 0.61–1.24)
GFR calc Af Amer: >60 mL/min (ref >60)
GFR calc non Af Amer: >60 mL/min (ref >60)
GFR calc non Af Amer: >60 mL/min (ref >60)
GLUCOSE: 128 mg/dL — AB (ref 65–99)
GLUCOSE: 213 mg/dL — AB (ref 65–99)
Glucose, Bld: 141 mg/dL — ABNORMAL HIGH (ref 65–99)
Potassium: 4.5 mmol/L (ref 3.5–5.1)
Potassium: 4.7 mmol/L (ref 3.5–5.1)
Potassium: 4.7 mmol/L (ref 3.5–5.1)
SODIUM: 142 mmol/L (ref 135–145)
Sodium: 141 mmol/L (ref 135–145)
Sodium: 141 mmol/L (ref 135–145)

## 2016-12-21 LAB — PROTIME-INR
INR: 1.72
PROTHROMBIN TIME: 20.4 s — AB (ref 11.4–15.2)

## 2016-12-22 LAB — BASIC METABOLIC PANEL
ANION GAP: 6 (ref 5–15)
ANION GAP: 7 (ref 5–15)
BUN: 18 mg/dL (ref 6–20)
BUN: 19 mg/dL (ref 6–20)
CALCIUM: 8.1 mg/dL — AB (ref 8.9–10.3)
CHLORIDE: 101 mmol/L (ref 101–111)
CO2: 34 mmol/L — AB (ref 22–32)
CO2: 32 mmol/L (ref 22–32)
CREATININE: 0.61 mg/dL (ref 0.61–1.24)
Calcium: 8.4 mg/dL — ABNORMAL LOW (ref 8.9–10.3)
Chloride: 100 mmol/L — ABNORMAL LOW (ref 101–111)
Creatinine, Ser: 0.58 mg/dL — ABNORMAL LOW (ref 0.61–1.24)
GFR calc Af Amer: >60 mL/min (ref >60)
GFR calc non Af Amer: >60 mL/min (ref >60)
GFR calc non Af Amer: >60 mL/min (ref >60)
GLUCOSE: 216 mg/dL — AB (ref 65–99)
Glucose, Bld: 169 mg/dL — ABNORMAL HIGH (ref 65–99)
POTASSIUM: 5.1 mmol/L (ref 3.5–5.1)
Potassium: 4.2 mmol/L (ref 3.5–5.1)
SODIUM: 141 mmol/L (ref 135–145)
SODIUM: 139 mmol/L (ref 135–145)

## 2016-12-22 LAB — PROTIME-INR
INR: 2.25
Prothrombin Time: 25.3 seconds — ABNORMAL HIGH (ref 11.4–15.2)

## 2016-12-23 LAB — BLOOD GAS, ARTERIAL
Acid-Base Excess: 7.9 mmol/L — ABNORMAL HIGH (ref 0.0–2.0)
Bicarbonate: 32.6 mmol/L — ABNORMAL HIGH (ref 20.0–28.0)
FIO2: 28
O2 SAT: 95.5 %
PATIENT TEMPERATURE: 98.6
pCO2 arterial: 51.1 mmHg — ABNORMAL HIGH (ref 32.0–48.0)
pH, Arterial: 7.42 (ref 7.350–7.450)
pO2, Arterial: 80.2 mmHg — ABNORMAL LOW (ref 83.0–108.0)

## 2016-12-23 LAB — PROTIME-INR
INR: 3.14
Prothrombin Time: 33 seconds — ABNORMAL HIGH (ref 11.4–15.2)

## 2016-12-24 LAB — BLOOD GAS, ARTERIAL
Acid-Base Excess: 7.5 mmol/L — ABNORMAL HIGH (ref 0.0–2.0)
Bicarbonate: 32.5 mmol/L — ABNORMAL HIGH (ref 20.0–28.0)
FIO2: 28
O2 Saturation: 94.2 %
Patient temperature: 97
pCO2 arterial: 52.4 mmHg — ABNORMAL HIGH (ref 32.0–48.0)
pH, Arterial: 7.404 (ref 7.350–7.450)
pO2, Arterial: 72 mmHg — ABNORMAL LOW (ref 83.0–108.0)

## 2016-12-24 LAB — PROTIME-INR
INR: 3.68
Prothrombin Time: 37.5 seconds — ABNORMAL HIGH (ref 11.4–15.2)

## 2016-12-25 ENCOUNTER — Other Ambulatory Visit (HOSPITAL_COMMUNITY): Payer: Self-pay

## 2016-12-25 LAB — BLOOD GAS, ARTERIAL
ACID-BASE EXCESS: 8 mmol/L — AB (ref 0.0–2.0)
Bicarbonate: 32.6 mmol/L — ABNORMAL HIGH (ref 20.0–28.0)
DRAWN BY: 290171
FIO2: 28
O2 Saturation: 96.3 %
PATIENT TEMPERATURE: 98.6
pCO2 arterial: 50.7 mmHg — ABNORMAL HIGH (ref 32.0–48.0)
pH, Arterial: 7.424 (ref 7.350–7.450)
pO2, Arterial: 86.6 mmHg (ref 83.0–108.0)

## 2016-12-25 LAB — PROTIME-INR
INR: 2.61
Prothrombin Time: 28.5 seconds — ABNORMAL HIGH (ref 11.4–15.2)

## 2016-12-26 LAB — PROTIME-INR
INR: 2.56
PROTHROMBIN TIME: 28 s — AB (ref 11.4–15.2)

## 2016-12-26 LAB — BLOOD GAS, ARTERIAL
Acid-Base Excess: 7.3 mmol/L — ABNORMAL HIGH (ref 0.0–2.0)
Bicarbonate: 32.3 mmol/L — ABNORMAL HIGH (ref 20.0–28.0)
Drawn by: 244851
FIO2: 90
O2 Saturation: 95 %
PH ART: 7.393 (ref 7.350–7.450)
Patient temperature: 98
pCO2 arterial: 53.9 mmHg — ABNORMAL HIGH (ref 32.0–48.0)
pO2, Arterial: 80.5 mmHg — ABNORMAL LOW (ref 83.0–108.0)

## 2016-12-27 DIAGNOSIS — Z9889 Other specified postprocedural states: Secondary | ICD-10-CM

## 2016-12-27 DIAGNOSIS — Z8679 Personal history of other diseases of the circulatory system: Secondary | ICD-10-CM

## 2016-12-27 LAB — PROTIME-INR
INR: 3.02
PROTHROMBIN TIME: 31.9 s — AB (ref 11.4–15.2)

## 2016-12-27 LAB — BLOOD GAS, ARTERIAL
ACID-BASE EXCESS: 9.1 mmol/L — AB (ref 0.0–2.0)
Bicarbonate: 34.9 mmol/L — ABNORMAL HIGH (ref 20.0–28.0)
FIO2: 28
O2 Saturation: 93.6 %
PATIENT TEMPERATURE: 92.2
pCO2 arterial: 55.5 mmHg — ABNORMAL HIGH (ref 32.0–48.0)
pH, Arterial: 7.394 (ref 7.350–7.450)
pO2, Arterial: 62.1 mmHg — ABNORMAL LOW (ref 83.0–108.0)

## 2016-12-28 LAB — PROTIME-INR
INR: 3.4
Prothrombin Time: 35.1 seconds — ABNORMAL HIGH (ref 11.4–15.2)

## 2016-12-29 ENCOUNTER — Other Ambulatory Visit (HOSPITAL_COMMUNITY): Payer: Self-pay

## 2016-12-29 DIAGNOSIS — I509 Heart failure, unspecified: Secondary | ICD-10-CM | POA: Diagnosis not present

## 2016-12-29 LAB — PROTIME-INR
INR: 2.95
Prothrombin Time: 31.4 seconds — ABNORMAL HIGH (ref 11.4–15.2)

## 2016-12-30 LAB — BASIC METABOLIC PANEL
Anion gap: 9 (ref 5–15)
BUN: 18 mg/dL (ref 6–20)
CHLORIDE: 93 mmol/L — AB (ref 101–111)
CO2: 32 mmol/L (ref 22–32)
Calcium: 8 mg/dL — ABNORMAL LOW (ref 8.9–10.3)
Creatinine, Ser: 0.5 mg/dL — ABNORMAL LOW (ref 0.61–1.24)
GFR calc Af Amer: >60 mL/min (ref >60)
GFR calc non Af Amer: >60 mL/min (ref >60)
GLUCOSE: 146 mg/dL — AB (ref 65–99)
Potassium: 3.9 mmol/L (ref 3.5–5.1)
SODIUM: 134 mmol/L — AB (ref 135–145)

## 2016-12-30 LAB — PROTIME-INR
INR: 1.98
PROTHROMBIN TIME: 22.8 s — AB (ref 11.4–15.2)

## 2017-01-01 LAB — BASIC METABOLIC PANEL
ANION GAP: 9 (ref 5–15)
BUN: 24 mg/dL — ABNORMAL HIGH (ref 6–20)
CHLORIDE: 92 mmol/L — AB (ref 101–111)
CO2: 37 mmol/L — ABNORMAL HIGH (ref 22–32)
Calcium: 8.2 mg/dL — ABNORMAL LOW (ref 8.9–10.3)
Creatinine, Ser: 0.61 mg/dL (ref 0.61–1.24)
GFR calc Af Amer: >60 mL/min (ref >60)
GLUCOSE: 97 mg/dL (ref 65–99)
POTASSIUM: 3.2 mmol/L — AB (ref 3.5–5.1)
SODIUM: 138 mmol/L (ref 135–145)

## 2017-01-01 LAB — CBC
HCT: 31.5 % — ABNORMAL LOW (ref 39.0–52.0)
Hemoglobin: 8.8 g/dL — ABNORMAL LOW (ref 13.0–17.0)
MCH: 25.4 pg — AB (ref 26.0–34.0)
MCHC: 27.9 g/dL — ABNORMAL LOW (ref 30.0–36.0)
MCV: 90.8 fL (ref 78.0–100.0)
PLATELETS: 239 10*3/uL (ref 150–400)
RBC: 3.47 MIL/uL — AB (ref 4.22–5.81)
RDW: 19.2 % — ABNORMAL HIGH (ref 11.5–15.5)
WBC: 7.3 10*3/uL (ref 4.0–10.5)

## 2017-01-01 LAB — PROTIME-INR
INR: 2.18
Prothrombin Time: 24.6 seconds — ABNORMAL HIGH (ref 11.4–15.2)

## 2017-01-01 LAB — BRAIN NATRIURETIC PEPTIDE: B NATRIURETIC PEPTIDE 5: 848.9 pg/mL — AB (ref 0.0–100.0)

## 2017-01-02 LAB — POTASSIUM: POTASSIUM: 3.6 mmol/L (ref 3.5–5.1)

## 2017-01-03 LAB — PROTIME-INR
INR: 2.51
PROTHROMBIN TIME: 27.6 s — AB (ref 11.4–15.2)

## 2017-01-04 ENCOUNTER — Other Ambulatory Visit (HOSPITAL_COMMUNITY): Payer: Self-pay

## 2017-01-04 LAB — PROTIME-INR
INR: 2.57
PROTHROMBIN TIME: 28.1 s — AB (ref 11.4–15.2)

## 2017-01-05 ENCOUNTER — Other Ambulatory Visit (HOSPITAL_COMMUNITY): Payer: Self-pay

## 2017-01-05 DIAGNOSIS — J9690 Respiratory failure, unspecified, unspecified whether with hypoxia or hypercapnia: Secondary | ICD-10-CM | POA: Diagnosis not present

## 2017-01-05 LAB — BASIC METABOLIC PANEL
ANION GAP: 10 (ref 5–15)
BUN: 27 mg/dL — ABNORMAL HIGH (ref 6–20)
CHLORIDE: 93 mmol/L — AB (ref 101–111)
CO2: 38 mmol/L — AB (ref 22–32)
CREATININE: 0.55 mg/dL — AB (ref 0.61–1.24)
Calcium: 8.6 mg/dL — ABNORMAL LOW (ref 8.9–10.3)
GFR calc non Af Amer: >60 mL/min (ref >60)
Glucose, Bld: 86 mg/dL (ref 65–99)
Potassium: 3.3 mmol/L — ABNORMAL LOW (ref 3.5–5.1)
SODIUM: 141 mmol/L (ref 135–145)

## 2017-01-05 LAB — MAGNESIUM: MAGNESIUM: 1.9 mg/dL (ref 1.7–2.4)

## 2017-01-05 LAB — PROTIME-INR
INR: 2.55
PROTHROMBIN TIME: 27.9 s — AB (ref 11.4–15.2)

## 2017-01-06 LAB — PROTIME-INR
INR: 2.41
Prothrombin Time: 26.6 seconds — ABNORMAL HIGH (ref 11.4–15.2)

## 2017-01-06 LAB — POTASSIUM: Potassium: 3.8 mmol/L (ref 3.5–5.1)

## 2017-01-07 LAB — DIGOXIN LEVEL: Digoxin Level: 1.5 ng/mL (ref 0.8–2.0)

## 2017-01-07 LAB — PROTIME-INR
INR: 2.51
Prothrombin Time: 27.6 seconds — ABNORMAL HIGH (ref 11.4–15.2)

## 2017-01-08 LAB — BASIC METABOLIC PANEL
ANION GAP: 10 (ref 5–15)
BUN: 16 mg/dL (ref 6–20)
CALCIUM: 8.4 mg/dL — AB (ref 8.9–10.3)
CO2: 35 mmol/L — ABNORMAL HIGH (ref 22–32)
Chloride: 91 mmol/L — ABNORMAL LOW (ref 101–111)
Creatinine, Ser: 0.6 mg/dL — ABNORMAL LOW (ref 0.61–1.24)
GLUCOSE: 90 mg/dL (ref 65–99)
Potassium: 3.1 mmol/L — ABNORMAL LOW (ref 3.5–5.1)
Sodium: 136 mmol/L (ref 135–145)

## 2017-01-09 LAB — PROTIME-INR
INR: 2.8
PROTHROMBIN TIME: 30.1 s — AB (ref 11.4–15.2)

## 2017-01-09 LAB — POTASSIUM: POTASSIUM: 3.7 mmol/L (ref 3.5–5.1)

## 2017-01-10 LAB — PROTIME-INR
INR: 3.54
Prothrombin Time: 36.3 seconds — ABNORMAL HIGH (ref 11.4–15.2)

## 2017-01-11 LAB — PROTIME-INR
INR: 3.41
PROTHROMBIN TIME: 35.2 s — AB (ref 11.4–15.2)

## 2017-01-12 ENCOUNTER — Other Ambulatory Visit (HOSPITAL_COMMUNITY): Payer: Self-pay

## 2017-01-12 DIAGNOSIS — R4182 Altered mental status, unspecified: Secondary | ICD-10-CM | POA: Diagnosis not present

## 2017-01-12 LAB — PROTIME-INR
INR: 2.41
Prothrombin Time: 26.7 seconds — ABNORMAL HIGH (ref 11.4–15.2)

## 2017-01-13 ENCOUNTER — Telehealth: Payer: Self-pay

## 2017-01-13 LAB — PROTIME-INR
INR: 2.3
PROTHROMBIN TIME: 25.7 s — AB (ref 11.4–15.2)

## 2017-01-13 NOTE — Telephone Encounter (Signed)
Scott [name] is currently @ [facility], He was readmitted on 11/18/16.

## 2017-01-14 LAB — PROTIME-INR
INR: 1.98
PROTHROMBIN TIME: 22.8 s — AB (ref 11.4–15.2)

## 2017-01-15 ENCOUNTER — Other Ambulatory Visit (HOSPITAL_COMMUNITY): Payer: Self-pay

## 2017-01-15 DIAGNOSIS — J90 Pleural effusion, not elsewhere classified: Secondary | ICD-10-CM | POA: Diagnosis not present

## 2017-01-15 LAB — CBC
HEMATOCRIT: 33.6 % — AB (ref 39.0–52.0)
Hemoglobin: 9.8 g/dL — ABNORMAL LOW (ref 13.0–17.0)
MCH: 25.3 pg — AB (ref 26.0–34.0)
MCHC: 29.2 g/dL — ABNORMAL LOW (ref 30.0–36.0)
MCV: 86.6 fL (ref 78.0–100.0)
Platelets: 359 10*3/uL (ref 150–400)
RBC: 3.88 MIL/uL — AB (ref 4.22–5.81)
RDW: 17.4 % — ABNORMAL HIGH (ref 11.5–15.5)
WBC: 10.7 10*3/uL — ABNORMAL HIGH (ref 4.0–10.5)

## 2017-01-15 LAB — BASIC METABOLIC PANEL
Anion gap: 10 (ref 5–15)
BUN: 13 mg/dL (ref 6–20)
CHLORIDE: 89 mmol/L — AB (ref 101–111)
CO2: 37 mmol/L — AB (ref 22–32)
CREATININE: 0.62 mg/dL (ref 0.61–1.24)
Calcium: 8.3 mg/dL — ABNORMAL LOW (ref 8.9–10.3)
GFR calc Af Amer: >60 mL/min (ref >60)
GFR calc non Af Amer: >60 mL/min (ref >60)
GLUCOSE: 116 mg/dL — AB (ref 65–99)
POTASSIUM: 2.7 mmol/L — AB (ref 3.5–5.1)
Sodium: 136 mmol/L (ref 135–145)

## 2017-01-15 LAB — PROTIME-INR
INR: 1.91
Prothrombin Time: 22.2 seconds — ABNORMAL HIGH (ref 11.4–15.2)

## 2017-01-16 LAB — POTASSIUM: Potassium: 3.3 mmol/L — ABNORMAL LOW (ref 3.5–5.1)

## 2017-01-16 LAB — PROTIME-INR
INR: 1.92
Prothrombin Time: 22.3 seconds — ABNORMAL HIGH (ref 11.4–15.2)

## 2017-01-17 LAB — PROTIME-INR
INR: 2.01
PROTHROMBIN TIME: 23.1 s — AB (ref 11.4–15.2)

## 2017-01-17 LAB — POTASSIUM: POTASSIUM: 3.5 mmol/L (ref 3.5–5.1)

## 2017-01-18 LAB — PROTIME-INR
INR: 1.98
PROTHROMBIN TIME: 22.8 s — AB (ref 11.4–15.2)

## 2017-01-19 LAB — PROTIME-INR
INR: 1.91
PROTHROMBIN TIME: 22.1 s — AB (ref 11.4–15.2)

## 2017-01-20 LAB — PROTIME-INR
INR: 2
Prothrombin Time: 23 seconds — ABNORMAL HIGH (ref 11.4–15.2)

## 2017-01-21 LAB — BLOOD GAS, ARTERIAL
ACID-BASE EXCESS: 9.3 mmol/L — AB (ref 0.0–2.0)
BICARBONATE: 32.3 mmol/L — AB (ref 20.0–28.0)
FIO2: 0.21
O2 Saturation: 97.3 %
PCO2 ART: 36.4 mmHg (ref 32.0–48.0)
PH ART: 7.557 — AB (ref 7.350–7.450)
PO2 ART: 86.7 mmHg (ref 83.0–108.0)
Patient temperature: 98.6

## 2017-01-21 LAB — PROTIME-INR
INR: 1.85
PROTHROMBIN TIME: 21.6 s — AB (ref 11.4–15.2)

## 2017-01-22 LAB — PROTIME-INR
INR: 2.11
Prothrombin Time: 24 seconds — ABNORMAL HIGH (ref 11.4–15.2)

## 2017-01-23 LAB — PROTIME-INR
INR: 2.02
Prothrombin Time: 23.2 seconds — ABNORMAL HIGH (ref 11.4–15.2)

## 2017-01-24 LAB — PROTIME-INR
INR: 1.95
Prothrombin Time: 22.5 seconds — ABNORMAL HIGH (ref 11.4–15.2)

## 2017-01-25 ENCOUNTER — Other Ambulatory Visit: Payer: Self-pay | Admitting: Urology

## 2017-01-25 LAB — PROTIME-INR
INR: 2.01
PROTHROMBIN TIME: 23 s — AB (ref 11.4–15.2)

## 2017-01-26 ENCOUNTER — Other Ambulatory Visit: Payer: Self-pay | Admitting: Urology

## 2017-01-26 LAB — PROTIME-INR
INR: 1.88
Prothrombin Time: 21.9 seconds — ABNORMAL HIGH (ref 11.4–15.2)

## 2017-01-27 LAB — PROTIME-INR
INR: 1.9
PROTHROMBIN TIME: 22.1 s — AB (ref 11.4–15.2)

## 2017-01-28 ENCOUNTER — Other Ambulatory Visit: Payer: Self-pay | Admitting: Urology

## 2017-01-28 LAB — PROTIME-INR
INR: 1.72
PROTHROMBIN TIME: 20.4 s — AB (ref 11.4–15.2)

## 2017-01-29 ENCOUNTER — Other Ambulatory Visit: Payer: Self-pay | Admitting: Urology

## 2017-01-29 ENCOUNTER — Other Ambulatory Visit (INDEPENDENT_AMBULATORY_CARE_PROVIDER_SITE_OTHER): Payer: Self-pay | Admitting: Family

## 2017-01-29 ENCOUNTER — Encounter
Admission: AD | Disposition: A | Discharge status: Rehabilitation Facility | Payer: Self-pay | Source: Ambulatory Visit | Attending: Internal Medicine

## 2017-01-29 LAB — CBC WITH DIFFERENTIAL/PLATELET
Basophils Absolute: 0 10*3/uL (ref 0.0–0.1)
Basophils Relative: 0 %
Eosinophils Absolute: 0 10*3/uL (ref 0.0–0.7)
Eosinophils Relative: 0 %
HEMATOCRIT: 31.1 % — AB (ref 39.0–52.0)
HEMOGLOBIN: 9.4 g/dL — AB (ref 13.0–17.0)
LYMPHS ABS: 0.9 10*3/uL (ref 0.7–4.0)
LYMPHS PCT: 5 %
MCH: 24.5 pg — AB (ref 26.0–34.0)
MCHC: 30.2 g/dL (ref 30.0–36.0)
MCV: 81.2 fL (ref 78.0–100.0)
MONOS PCT: 5 %
Monocytes Absolute: 0.9 10*3/uL (ref 0.1–1.0)
NEUTROS ABS: 15.8 10*3/uL — AB (ref 1.7–7.7)
Neutrophils Relative %: 90 %
Platelets: 356 10*3/uL (ref 150–400)
RBC: 3.83 MIL/uL — AB (ref 4.22–5.81)
RDW: 17.7 % — ABNORMAL HIGH (ref 11.5–15.5)
WBC: 17.7 10*3/uL — AB (ref 4.0–10.5)

## 2017-01-29 LAB — PROTIME-INR
INR: 1.55
Prothrombin Time: 18.7 seconds — ABNORMAL HIGH (ref 11.4–15.2)

## 2017-01-29 LAB — BASIC METABOLIC PANEL
Anion gap: 10 (ref 5–15)
BUN: 15 mg/dL (ref 6–20)
CHLORIDE: 91 mmol/L — AB (ref 101–111)
CO2: 29 mmol/L (ref 22–32)
CREATININE: 0.63 mg/dL (ref 0.61–1.24)
Calcium: 8.4 mg/dL — ABNORMAL LOW (ref 8.9–10.3)
GFR calc Af Amer: >60 mL/min (ref >60)
GFR calc non Af Amer: >60 mL/min (ref >60)
Glucose, Bld: 155 mg/dL — ABNORMAL HIGH (ref 65–99)
POTASSIUM: 3.3 mmol/L — AB (ref 3.5–5.1)
SODIUM: 130 mmol/L — AB (ref 135–145)

## 2017-01-29 SURGERY — REMOVAL, PENILE PROSTHESIS
Anesthesia: General

## 2017-01-30 LAB — PROTIME-INR
INR: 1.61
PROTHROMBIN TIME: 19.3 s — AB (ref 11.4–15.2)

## 2017-01-31 LAB — PROTIME-INR
INR: 1.29
Prothrombin Time: 16.2 seconds — ABNORMAL HIGH (ref 11.4–15.2)

## 2017-01-31 MED ORDER — GENTAMICIN SULFATE 40 MG/ML IJ SOLN
5.0000 mg/kg | INTRAVENOUS | Status: AC
  Administered 2017-02-01: 410 mg via INTRAVENOUS
  Filled 2017-01-31: qty 10.25

## 2017-01-31 MED ORDER — CEFAZOLIN SODIUM-DEXTROSE 2-4 GM/100ML-% IV SOLN: 2.0000 g | INTRAVENOUS | Status: DC

## 2017-02-01 ENCOUNTER — Encounter (HOSPITAL_COMMUNITY): Payer: Self-pay | Admitting: Anesthesiology

## 2017-02-01 ENCOUNTER — Encounter
Admission: AD | Disposition: A | Discharge status: Rehabilitation Facility | Payer: Self-pay | Source: Ambulatory Visit | Attending: Internal Medicine

## 2017-02-01 DIAGNOSIS — M67421 Ganglion, right elbow: Secondary | ICD-10-CM

## 2017-02-01 DIAGNOSIS — M86141 Other acute osteomyelitis, right hand: Secondary | ICD-10-CM | POA: Diagnosis not present

## 2017-02-01 HISTORY — PX: REMOVAL OF PENILE PROSTHESIS: SHX6059

## 2017-02-01 HISTORY — PX: BONE EXCISION: SHX6730

## 2017-02-01 LAB — GLUCOSE, CAPILLARY: GLUCOSE-CAPILLARY: 88 mg/dL (ref 65–99)

## 2017-02-01 LAB — BASIC METABOLIC PANEL
Anion gap: 7 (ref 5–15)
BUN: 12 mg/dL (ref 6–20)
CALCIUM: 8.1 mg/dL — AB (ref 8.9–10.3)
CO2: 31 mmol/L (ref 22–32)
Chloride: 99 mmol/L — ABNORMAL LOW (ref 101–111)
Creatinine, Ser: 0.53 mg/dL — ABNORMAL LOW (ref 0.61–1.24)
GFR calc non Af Amer: >60 mL/min (ref >60)
Glucose, Bld: 107 mg/dL — ABNORMAL HIGH (ref 65–99)
Potassium: 3.6 mmol/L (ref 3.5–5.1)
Sodium: 137 mmol/L (ref 135–145)

## 2017-02-01 LAB — PROTIME-INR
INR: 1.42
Prothrombin Time: 17.5 seconds — ABNORMAL HIGH (ref 11.4–15.2)

## 2017-02-01 LAB — CBC
HEMATOCRIT: 29.8 % — AB (ref 39.0–52.0)
HEMOGLOBIN: 8.7 g/dL — AB (ref 13.0–17.0)
MCH: 24 pg — ABNORMAL LOW (ref 26.0–34.0)
MCHC: 29.2 g/dL — ABNORMAL LOW (ref 30.0–36.0)
MCV: 82.3 fL (ref 78.0–100.0)
Platelets: 321 10*3/uL (ref 150–400)
RBC: 3.62 MIL/uL — ABNORMAL LOW (ref 4.22–5.81)
RDW: 17.3 % — ABNORMAL HIGH (ref 11.5–15.5)
WBC: 7.8 10*3/uL (ref 4.0–10.5)

## 2017-02-01 LAB — AEROBIC CULTURE  (SUPERFICIAL SPECIMEN)

## 2017-02-01 LAB — AEROBIC CULTURE W GRAM STAIN (SUPERFICIAL SPECIMEN)

## 2017-02-01 SURGERY — REMOVAL, PENILE PROSTHESIS
Anesthesia: General | Site: Hand | Laterality: Right

## 2017-02-01 MED ORDER — MIDAZOLAM HCL 2 MG/2ML IJ SOLN
INTRAMUSCULAR | Status: AC
  Filled 2017-02-01: qty 2

## 2017-02-01 MED ORDER — CHLORHEXIDINE GLUCONATE 4 % EX LIQD: 60.0000 mL | Freq: Once | CUTANEOUS | Status: DC

## 2017-02-01 MED ORDER — FENTANYL CITRATE (PF) 100 MCG/2ML IJ SOLN
INTRAMUSCULAR | Status: AC
  Filled 2017-02-01: qty 4

## 2017-02-01 MED ORDER — POVIDONE-IODINE 10 % EX SWAB: 2.0000 "application " | Freq: Once | CUTANEOUS | Status: DC

## 2017-02-01 MED ORDER — LACTATED RINGERS IV SOLN
INTRAVENOUS | Status: DC | PRN
  Administered 2017-02-01: 10:00:00 via INTRAVENOUS

## 2017-02-01 MED ORDER — MEPERIDINE HCL 25 MG/ML IJ SOLN: 6.2500 mg | INTRAMUSCULAR | Status: DC | PRN

## 2017-02-01 MED ORDER — CLINDAMYCIN PHOSPHATE 900 MG/50ML IV SOLN
INTRAVENOUS | Status: AC
  Filled 2017-02-01: qty 50

## 2017-02-01 MED ORDER — LIDOCAINE HCL (CARDIAC) 20 MG/ML IV SOLN
INTRAVENOUS | Status: DC | PRN
  Administered 2017-02-01: 60 mg via INTRAVENOUS

## 2017-02-01 MED ORDER — ATENOLOL 25 MG PO TABS
25.0000 mg | ORAL_TABLET | Freq: Every day | ORAL | Status: DC
  Administered 2017-02-01: 25 mg via ORAL
  Filled 2017-02-01: qty 1

## 2017-02-01 MED ORDER — FENTANYL CITRATE (PF) 100 MCG/2ML IJ SOLN
25.0000 ug | INTRAMUSCULAR | Status: DC | PRN
Start: 2017-02-01 — End: 2017-02-01

## 2017-02-01 MED ORDER — CLINDAMYCIN PHOSPHATE 900 MG/50ML IV SOLN
900.0000 mg | INTRAVENOUS | Status: AC
  Administered 2017-02-01: 900 mg via INTRAVENOUS

## 2017-02-01 MED ORDER — FENTANYL CITRATE (PF) 100 MCG/2ML IJ SOLN
INTRAMUSCULAR | Status: AC
  Filled 2017-02-01: qty 2

## 2017-02-01 MED ORDER — PHENYLEPHRINE HCL 10 MG/ML IJ SOLN
INTRAMUSCULAR | Status: DC | PRN
  Administered 2017-02-01: 25 ug/min via INTRAVENOUS

## 2017-02-01 MED ORDER — SODIUM CHLORIDE 0.9 % IR SOLN
Status: DC | PRN
  Administered 2017-02-01: 3000 mL

## 2017-02-01 MED ORDER — 0.9 % SODIUM CHLORIDE (POUR BTL) OPTIME
TOPICAL | Status: DC | PRN
  Administered 2017-02-01: 1000 mL

## 2017-02-01 MED ORDER — PROMETHAZINE HCL 25 MG/ML IJ SOLN: 6.2500 mg | INTRAMUSCULAR | Status: DC | PRN

## 2017-02-01 SURGICAL SUPPLY — 53 items
BAG URO CATCHER STRL LF (MISCELLANEOUS) IMPLANT
BANDAGE ACE 4X5 VEL STRL LF (GAUZE/BANDAGES/DRESSINGS) ×1 IMPLANT
BLADE 10 SAFETY STRL DISP (BLADE) ×2 IMPLANT
BLADE SAW SGTL MED 73X18.5 STR (BLADE) IMPLANT
BLADE SURG 15 STRL LF DISP TIS (BLADE) ×4 IMPLANT
BLADE SURG 15 STRL SS (BLADE)
BLADE SURG 21 STRL SS (BLADE) ×3 IMPLANT
BNDG COHESIVE 4X5 TAN STRL (GAUZE/BANDAGES/DRESSINGS) ×2 IMPLANT
BNDG GAUZE ELAST 4 BULKY (GAUZE/BANDAGES/DRESSINGS) ×3 IMPLANT
CANISTER SUCT 3000ML PPV (MISCELLANEOUS) ×2 IMPLANT
CATH FOLEY 2WAY SLVR  5CC 16FR (CATHETERS) ×1
CATH FOLEY 2WAY SLVR 5CC 16FR (CATHETERS) IMPLANT
CATH ROBINSON RED A/P 18FR (CATHETERS) ×1 IMPLANT
CONT SPEC 4OZ CLIKSEAL STRL BL (MISCELLANEOUS) ×2 IMPLANT
COVER SURGICAL LIGHT HANDLE (MISCELLANEOUS) ×6 IMPLANT
DRAPE U-SHAPE 47X51 STRL (DRAPES) ×6 IMPLANT
DRSG ADAPTIC 3X8 NADH LF (GAUZE/BANDAGES/DRESSINGS) ×3 IMPLANT
DRSG EMULSION OIL 3X3 NADH (GAUZE/BANDAGES/DRESSINGS) ×1 IMPLANT
DRSG PAD ABDOMINAL 8X10 ST (GAUZE/BANDAGES/DRESSINGS) ×5 IMPLANT
DURAPREP 26ML APPLICATOR (WOUND CARE) ×3 IMPLANT
ELECT CAUTERY BLADE 6.4 (BLADE) ×1 IMPLANT
ELECT REM PT RETURN 9FT ADLT (ELECTROSURGICAL) ×3
ELECTRODE REM PT RTRN 9FT ADLT (ELECTROSURGICAL) ×2 IMPLANT
GAUZE SPONGE 4X4 12PLY STRL (GAUZE/BANDAGES/DRESSINGS) ×2 IMPLANT
GLOVE BIO SURGEON STRL SZ8 (GLOVE) ×3 IMPLANT
GLOVE BIOGEL PI IND STRL 9 (GLOVE) ×2 IMPLANT
GLOVE BIOGEL PI INDICATOR 9 (GLOVE) ×1
GLOVE SURG ORTHO 9.0 STRL STRW (GLOVE) ×3 IMPLANT
GOWN STRL REUS W/ TWL XL LVL3 (GOWN DISPOSABLE) ×4 IMPLANT
GOWN STRL REUS W/TWL XL LVL3 (GOWN DISPOSABLE) ×3
KIT BASIN OR (CUSTOM PROCEDURE TRAY) ×3 IMPLANT
KIT ROOM TURNOVER OR (KITS) ×3 IMPLANT
NS IRRIG 1000ML POUR BTL (IV SOLUTION) ×3 IMPLANT
PACK CYSTO (CUSTOM PROCEDURE TRAY) ×2 IMPLANT
PACK GENERAL/GYN (CUSTOM PROCEDURE TRAY) ×3 IMPLANT
PACK ORTHO EXTREMITY (CUSTOM PROCEDURE TRAY) ×2 IMPLANT
PAD ABD 8X10 STRL (GAUZE/BANDAGES/DRESSINGS) ×1 IMPLANT
PAD ARMBOARD 7.5X6 YLW CONV (MISCELLANEOUS) ×5 IMPLANT
PLUG CATH AND CAP STER (CATHETERS) ×1 IMPLANT
SOLUTION BETADINE 4OZ (MISCELLANEOUS) ×3 IMPLANT
SPONGE GAUZE 4X4 12PLY STER LF (GAUZE/BANDAGES/DRESSINGS) ×2 IMPLANT
SPONGE LAP 18X18 X RAY DECT (DISPOSABLE) ×2 IMPLANT
STOCKINETTE IMPERVIOUS LG (DRAPES) IMPLANT
SUT CHROMIC 3 0 SH 27 (SUTURE) ×3 IMPLANT
SUT ETHILON 2 0 FS 18 (SUTURE) ×2 IMPLANT
SUT ETHILON 2 0 PSLX (SUTURE) ×2 IMPLANT
SUT PDS AB 0 CT 36 (SUTURE) ×1 IMPLANT
SUT VICRYL 0 UR6 27IN ABS (SUTURE) ×1 IMPLANT
SWAB COLLECTION DEVICE MRSA (MISCELLANEOUS) ×3 IMPLANT
TOWEL OR 17X24 6PK STRL BLUE (TOWEL DISPOSABLE) ×3 IMPLANT
TOWEL OR 17X26 10 PK STRL BLUE (TOWEL DISPOSABLE) ×3 IMPLANT
TRAY FOLEY BAG SILVER LF 16FR (SET/KITS/TRAYS/PACK) ×1 IMPLANT
WATER STERILE IRR 1000ML POUR (IV SOLUTION) ×2 IMPLANT

## 2017-02-01 NOTE — Op Note (Signed)
11/18/2016 - 02/01/2017  11:40 AM  PATIENT:  Scott [name]    PRE-OPERATIVE DIAGNOSIS:  OSTEOMYELITIS OF RIGHT INDEX METACARPAL  POST-OPERATIVE DIAGNOSIS:  Same  PROCEDURE:  Amputation index ray right hand, local tissue rearrangement for wound closure 2 x 4 cm.  SURGEON:  [name], MD  PHYSICIAN ASSISTANT:None ANESTHESIA:   General  PREOPERATIVE INDICATIONS:  [name] is a  71 y.o. male with a diagnosis of EXTRUDED PENILE PROSTESIS,OSTEOMYELITIS OF RIGHT INDEX METACARPAL who failed conservative measures and elected for surgical management.    The risks benefits and alternatives were discussed with the patient preoperatively including but not limited to the risks of infection, bleeding, nerve injury, cardiopulmonary complications, the need for revision surgery, among others, and the patient was willing to proceed.  OPERATIVE IMPLANTS: None  OPERATIVE FINDINGS: Calcified vessels  OPERATIVE PROCEDURE: Patient was already in the operating room for a surgical procedure remove his penile implant. After the procedure was completed a new sterile setup was obtained, a timeout was performed and the right upper extremity was prepped using DuraPrep draped into a sterile field. A elliptical incision was made around the necrotic tissue. The first ray was amputated through the mid shaft. Local tissue rearrangement was used for wound closure 2 x 4 cm. Wound was closed using 2-0 nylon. A sterile compressive dressing was applied patient was extubated taken to the PACU in stable condition [facility].

## 2017-02-01 NOTE — Anesthesia Procedure Notes (Signed)
Procedure Name: Intubation Date/Time: 02/01/2017 10:08 AM Performed by: Rebekah Chesterfield L Pre-anesthesia Checklist: Patient identified, Emergency Drugs available, Suction available, Patient being monitored and Timeout performed Patient Re-evaluated:Patient Re-evaluated prior to inductionOxygen Delivery Method: Circle system utilized Preoxygenation: Pre-oxygenation with 100% oxygen Intubation Type: Inhalational induction and Tracheostomy Tube type: Reinforced Tube size: 6.5 mm Number of attempts: 1 Placement Confirmation: positive ETCO2 and breath sounds checked- equal and bilateral Comments: Inhalation induction through preexisting uncuffed shiley trach   Changed to 6.5 reenforced ett s probs +etco2  = bbs secured with ties around neck

## 2017-02-01 NOTE — Anesthesia Preprocedure Evaluation (Signed)
Anesthesia Evaluation  Patient identified by MRN, date of birth, ID band Patient awake    Reviewed: Allergy & Precautions, NPO status , Patient's Chart, lab work & pertinent test results, reviewed documented beta blocker date and time   Airway Mallampati: Trach  TM Distance: >3 FB     Dental  (+) Dental Advisory Given   Pulmonary former smoker,    breath sounds clear to auscultation       Cardiovascular hypertension, Pt. on medications and Pt. on home beta blockers + angina + CAD, + CABG, + Peripheral Vascular Disease and +CHF  + Valvular Problems/Murmurs  Rhythm:Regular Rate:Normal  S/p AVR, MVR, CABx3   Neuro/Psych PSYCHIATRIC DISORDERS  Neuromuscular disease    GI/Hepatic GERD  ,  Endo/Other  diabetes, Type 2, Oral Hypoglycemic Agents  Renal/GU      Musculoskeletal  (+) Arthritis , Osteoarthritis,    Abdominal   Peds negative pediatric ROS (+)  Hematology negative hematology ROS (+)   Anesthesia Other Findings   Reproductive/Obstetrics negative OB ROS                             Lab Results  Component Value Date   WBC 7.8 02/01/2017   HGB 8.7 (L) 02/01/2017   HCT 29.8 (L) 02/01/2017   MCV 82.3 02/01/2017   PLT 321 02/01/2017   Lab Results  Component Value Date   CREATININE 0.53 (L) 02/01/2017   BUN 12 02/01/2017   NA 137 02/01/2017   K 3.6 02/01/2017   CL 99 (L) 02/01/2017   CO2 31 02/01/2017   Lab Results  Component Value Date   INR 1.42 02/01/2017   INR 1.29 01/31/2017   INR 1.61 01/30/2017   EKG: atrial fibrillation.  Echo: - Left ventricle: The cavity size was normal. There was moderate   concentric hypertrophy. Systolic function was normal. The   estimated ejection fraction was in the range of 55% to 60%. Wall   motion was normal; there were no regional wall motion   abnormalities. The study is not technically sufficient to allow   evaluation of LV diastolic  function. - Aortic valve: There was mild regurgitation. - Mitral valve: Calcified annulus. Mildly thickened leaflets .   There was mild regurgitation. - Left atrium: The atrium was mildly dilated.     Anesthesia Physical  Anesthesia Plan  ASA: IV  Anesthesia Plan: General   Post-op Pain Management:    Induction: Inhalational  Airway Management Planned: Tracheostomy  Additional Equipment:   Intra-op Plan:   Post-operative Plan: Post-operative intubation/ventilation  Informed Consent: I have reviewed the patients History and Physical, chart, labs and discussed the procedure including the risks, benefits and alternatives for the proposed anesthesia with the patient or authorized representative who has indicated his/her understanding and acceptance.   Dental advisory given  Plan Discussed with: CRNA  Anesthesia Plan Comments:         Anesthesia Quick Evaluation

## 2017-02-01 NOTE — Interval H&P Note (Signed)
History and Physical Interval Note:  02/01/2017 9:24 AM  Scott [name]  has presented today for surgery, with the diagnosis of EXTRUDED PENILE PROSTESIS,OSTEOMYELITIS OF RIGHT INDEX METACARPAL  The various methods of treatment have been discussed with the patient and family. After consideration of risks, benefits and other options for treatment, the patient has consented to  Procedure(s): REMOVAL OF PENILE PROSTHESIS (N/A) EXCISION INDEX METACARPAL HEAD (Right) EXCISION RIGHT INDEX METACARPAL HEAD (Right) as a surgical intervention .  The patient's history has been reviewed, patient examined, no change in status, stable for surgery.  I have reviewed the patient's chart and labs.  Questions were answered to the patient's satisfaction.     [name]

## 2017-02-01 NOTE — Transfer of Care (Signed)
Immediate Anesthesia Transfer of Care Note  Patient: Scott [name]  Procedure(s) Performed: Procedure(s): REMOVAL OF PENILE PROSTHESIS (N/A) EXCISION RIGHT INDEX METACARPAL HEAD (Right)  Patient Location: PACU  Anesthesia Type:General  Level of Consciousness: awake, alert , oriented and patient cooperative  Airway & Oxygen Therapy: Patient Spontanous Breathing and Patient connected to tracheostomy mask oxygen  Post-op Assessment: Report given to RN and Post -op Vital signs reviewed and stable  Post vital signs: Reviewed and stable  Last Vitals:  Vitals:   12/07/16 1054 02/01/17 0954  BP: 103/73 106/65  Pulse: 83 68  Resp: 13 18  Temp:  (!) 36.1 C    Last Pain:  Vitals:   12/07/16 0930  PainSc: 5          Complications: No apparent anesthesia complications

## 2017-02-01 NOTE — H&P (Signed)
HPI: Scott [name] is an 71 y.o. male who underwent a CABG x3 with ascending aortic aneurysm  repair, and AV replacement in November who had a complicated course by VDRF, C. Diff, MRSA, a.  Fib, DIC with dry gangrene of all 4 extremities who is [facility].     He has been on coumadin secondary to his A. Fib, but this has been held in preparation for his  surgery.  He had Peyronie's plaque incision and patch grafting in 11/06 and subsequently developed erectile  dysfunction. He has tried all of the oral agents and found that he is not able to achieve a  satisfactory erection with any of these.  He also tried intraurethral suppositories but found  these ineffective.  He elected to proceed with a penile prosthesis which was performed in 4/09. He subsequently developed erosion of his prosthesis through the distal aspect of his glans bilaterally and also has developed erosion over the pump on the lateral aspect of the right hemiscrotum. He said it is sore but he has not been having any fevers. He has been on antibiotics.     Past Medical History: Diagnosis Date . Arthritis   "hips, shoulders; knees; back" (10/20/2016) . Bicuspid aortic valve  . BPH (benign prostatic hypertrophy)  . Carpal tunnel syndrome of right wrist  . Coronary artery disease involving native coronary artery of native heart with unstable  angina pectoris ([place]) 10/20/2016 . Diverticulitis  . GERD (gastroesophageal reflux disease)  . Gout  . Heart murmur  . Hyperlipemia  . Hypertension  . Postoperative atrial fibrillation ([place]) 10/24/2016 . RLS (restless legs syndrome)  . S/P aortic valve replacement with bioprosthetic valve 10/22/2016  25 mm [facility] Ease bovine pericardial bioprosthetic tissue valve . S/P ascending aortic aneurysm repair 10/22/2016  28 mm supracoronary straight graft replacement of ascending thoracic aortic aneurysm . S/P CABG x  3 10/22/2016  Sequential LIMA to Diag and LAD, SVG to distal LAD, open vein harvest right thigh . S/P mitral valve repair 10/22/2016  Artificial Gore-tex neochord placement x6 - posterior annuloplasty band placed but  removed due to systolic anterior motion of mitral valve . Severe aortic stenosis  . Snores   Never been tested for sleep apnea . Thoracic ascending aortic aneurysm ([place]) 10/21/2016 . Thrombocytopenia ([place]) 10/22/2016 . Type II diabetes mellitus ([place])    Past Surgical History:  Gastrostomy tube placement Bilateral BKA and amputation of all digits except rt. thumb Procedure Laterality Date . AORTIC VALVE REPLACEMENT N/A 10/22/2016  Procedure: AORTIC VALVE REPLACEMENT (AVR) WITH SIZE 25 MM MAGNA EASE PERICARDIAL  BIOPROSTHESIS - AORTIC;  Surgeon: Rexene Alberts, MD;  Location: [place];  Service: Open Heart  Surgery;  Laterality: N/A; . CARDIAC CATHETERIZATION N/A 10/20/2016  Procedure: Right/Left Heart Cath and Coronary Angiography;  Surgeon: Leonie Man, MD;   Location: [facility];  Service: Cardiovascular;  Laterality: N/A; . CARPAL TUNNEL RELEASE Right 11/28/2013  Procedure: RIGHT WRIST CARPAL TUNNEL RELEASE;  Surgeon: Lorn Junes, MD;  Location:  [place];  Service: Orthopedics;  Laterality: Right; . CATARACT EXTRACTION W/ INTRAOCULAR LENS  IMPLANT, BILATERAL Bilateral 1978 . COLONOSCOPY   . CORONARY ARTERY BYPASS GRAFT N/A 10/22/2016  Procedure: CORONARY ARTERY BYPASS GRAFTING (CABG)x 2 WITH LIMA TO DIAGONAL, OPEN   HARVESTING OF RIGHT SAPHENOUS VEIN FOR VEIN GRAFT TO LAD;  Surgeon: Rexene Alberts, MD;   Location: [facility];  Service: Open Heart Surgery;  Laterality: N/A; .  FRACTURE SURGERY   . INGUINAL HERNIA REPAIR Right 1998 . LIPOMA EXCISION Right 2008  "side of my head" . MITRAL VALVE REPAIR N/A 10/22/2016  Procedure: MITRAL VALVE REPAIR (MVR) WITH SIZE 30 SORIN ANNULOFLEX ANNULOPLASTY RING WITH  SUBSEQUENT  REMOVAL OF RING;  Surgeon: Rexene Alberts, MD;  Location: [facility];  Service: Open Heart  Surgery;  Laterality: N/A; . PENECTOMY  2007  Peyronie's disease  . PENILE PROSTHESIS IMPLANT  2009 . SHOULDER OPEN ROTATOR CUFF REPAIR Right 2006 . STERNAL CLOSURE N/A 10/26/2016  Procedure: STERNAL WASHOUT AND DELAYED PRIMARY CLOSURE;  Surgeon: Rexene Alberts, MD;   Location: [facility];  Service: Thoracic;  Laterality: N/A; . TEE WITHOUT CARDIOVERSION N/A 10/26/2016  Procedure: TRANSESOPHAGEAL ECHOCARDIOGRAM (TEE);  Surgeon: Rexene Alberts, MD;   Location: [place];  Service: Thoracic;  Laterality: N/A; . TEE WITHOUT CARDIOVERSION N/A 10/22/2016  Procedure: TRANSESOPHAGEAL ECHOCARDIOGRAM (TEE);  Surgeon: Rexene Alberts, MD;   Location: [facility];  Service: Open Heart Surgery;  Laterality: N/A; . THORACIC AORTIC ANEURYSM REPAIR  10/22/2016  Procedure: ASCENDING AORTIC  ANEURYSM REPAIR (AAA) WITH 28 MM HEMASHIELD PLATINUM WOVEN  DOUBLE VELOUR VASCULAR GRAFT;  Surgeon: Rexene Alberts, MD;  Location: [facility];  Service: Open  Heart Surgery;; . TONSILLECTOMY  ~ 1955 . TRACHEOSTOMY TUBE PLACEMENT N/A 11/09/2016  Procedure: TRACHEOSTOMY;  Surgeon: Rexene Alberts, MD;  Location: [facility];  Service:  Thoracic;  Laterality: N/A; . TRANSURETHRAL RESECTION OF PROSTATE  2005 . TRIGGER FINGER RELEASE Bilateral   several lt and rt hands . TRIGGER FINGER RELEASE Right 11/28/2013  Procedure: RIGHT TRIGGER FINGER  RELEASE (TENDON SHEATH INCISION);  Surgeon: Lorn Junes, MD;  Location: [facility];  Service: Orthopedics;  Laterality: Right; Marland Kitchen VIDEO BRONCHOSCOPY N/A 11/09/2016  Procedure: VIDEO BRONCHOSCOPY;  Surgeon: Rexene Alberts, MD;  Location: [facility];  Service:  Thoracic;  Laterality: N/A; . WRIST FRACTURE SURGERY Right ~ 1959   Family History:   Problem Relation Age of Onset . Lung cancer Mother  . Clotting disorder Father    No details . Heart  disease Sister 78   Stents . Cancer Sister    Throat   Social History:  reports that he quit smoking about 34 years ago. His smoking use included Pipe  and Cigars. He quit after 3.00 years of use. He has never used smokeless tobacco. He reports that  he drinks about 6.0 oz of alcohol per week . He reports that he does not use drugs.  Allergies:  Allergies Allergen Reactions . Doxycycline Hives . Penicillins Hives    Has patient had a PCN reaction causing immediate rash, facial/tongue/throat swelling, SOB or  lightheadedness with hypotension: No Has patient had a PCN reaction causing severe rash involving mucus membranes or skin necrosis: No Has patient had a PCN reaction that required hospitalization: No Has patient had a PCN reaction occurring within the last 10 years:# # # YES # # #  If all of the above answers are "NO", then may proceed with Cephalosporin use.  . Sulfa Antibiotics Hives   Medications: Medications reviewed in paper chart  ROS: He reports soreness in the area of the prosthesis.    Physical Exam: Vitals have been reviewed in paper chart General: chronically ill-appearing white male who is laying in bed in NAD HEENT: head is normocephalic, atraumatic.  Sclera are noninjected.  PERRL.  Ears and nose without  any masses or lesions.  Mouth is pink.  NGT  present in his nose.  Trach in place in midline of  neck Heart: regular, rate, and rhythm.  Normal s1,s2. No obvious murmurs, gallops, or rubs noted.   Palpable radial pulses bilaterally Lungs: CTAB, no wheezes, rhonchi, or rales noted.  Respiratory effort nonlabored Abd: soft, NT, ND, +BS, no masses, hernias, or organomegaly, anasarca present GU: Both the right and left cylinders are protruding through the glans at the level of the meatus. There is no discharge from this location. The scrotum reveals an erosion where the pump is located in the pump is visible with purulent material  present. position Psych: Alert and seems oriented.  Unable to answer questions to further assess psych status  A/P: He has eroded a cylinder of his inflatable penile prosthesis.  Once this occurs the entire device needs to be completely removed if possible in order to prevent persistent infection from occurring due to the presence of a foreign body.  In his particular case due to his inability to operate the prosthesis I did not recommend replacement of an inflatable prosthesis in the future.  The placement of a semirigid prosthesis is an option that can be explored in the future if he so desires. I therefore have discussed the surgical procedure of removal of his prosthesis and discussed the incision that would be required in the scrotum as well as a possible incision over the lower abdomen/groin for removal of the reservoir.  We discussed the procedure in detail, its risks and complications, need for postoperative Penrose drain use and the anticipated postoperative course.   His Coumadin has been held and he has been on antibiotics.

## 2017-02-01 NOTE — Op Note (Signed)
PATIENT:  Scott [name]  PRE-OPERATIVE DIAGNOSIS: Eroded, infected inflatable penile prosthesis.   POST-OPERATIVE DIAGNOSIS: Same  PROCEDURE: Removal of three-piece inflatable penile prosthesis  SURGEON:  Claybon Jabs  INDICATION: Scott [name] is a 71 year old male who has had an inflatable penile prosthesis in for many years. He recently developed erosion of his prosthesis out through the glans at the urethral meatus on both right and left sides and then also developed an erosion of the scrotal skin on the lateral aspect on the right-hand side exposing the pump. Grossly purulent material can be expressed from the scrotal wound. He has been on antibiotic therapy and is not having any fever but is having some discomfort. We discussed the need for its removal in detail and he understands and has elected to proceed.  ANESTHESIA:  General  EBL:  Minimal  DRAINS: 1. 16 French Foley catheter 2. Quarter inch Penrose drains in the proximal and distal corpus cavernosum. 3. Half-inch Penrose drain in the previous reservoir site.  LOCAL MEDICATIONS USED:  None  SPECIMEN:  Aerobic and anaerobic cultures from the purulent material in the scrotum.  Description of procedure: After informed consent the patient was taken to the operating room and placed on the table in a supine position. General anesthesia was then administered. Once fully anesthetized and lower abdomen the genitalia were sterilely prepped and draped in standard fashion. An official timeout was then performed.  I initially placed a 89 French Foley catheter next to the extruded prosthesis cylinders down the urethra and noted that both cylinders had eroded through the distal urethra in the area of the fossa navicularis. I also noted purulence from the open scrotal erosion and obtain cultures from this purulent material.  A midline incision was made in the scrotum at the base of the penis and deepened in order to expose the pump  which was grasped and brought out through the incision. A retractor was placed to expose the right and left corpus cavernosum and using the tubing as a guide I cut down on the left corpus cavernosum and was able to expose the cylinder. 2-0 Vicryl sutures were placed in the edges of the corpus cavernosum and stay sutures. I found it easiest to pull the cylinder out through the distal opening and then cut the tubing. I then exposed the cylinder on the right-hand side in a similar fashion with stay sutures being inserted and removal of the cylinder. Both cylinders had there are 3 cm rear-tip extenders attached. I then placed traction on the tubing going to the reservoir and cut down anteriorly onto the tubing in order to expose the neck of the reservoir. I grasped this with Kelly clamp and was able to then extract this. Grossly purulent material was removed as well.  I placed an 43 French red rubber catheter into the opening of the reservoir and into the lower abdomen where the reservoir was located and then copiously irrigated this with saline. Further irrigation was then performed by first passing the 18 French red rubber catheter through the corporotomy and into the proximal corpus cavernosum on both right and left sides and irrigating these out copiously I then passed the red rubber catheter through the corporotomy into the distal corpus cavernosum and irrigated these as well. Next I used the pulse lavage and irrigated as best I could the area where the reservoir had been located and then used the pulse lavage to completely irrigate the scrotum.  I next placed the half  inch Penrose drain into the location where the reservoir had been previously. I tagged this with a hemostat and then placed quarter inch Penrose drains through the corporotomy into the proximal corpus cavernosum on right and left sides and then in the distal corpus cavernosum on the right and left sides making sure that the drain was not  protruding from the openings in the distal urethra to allow this to hopefully seal. I used a surgical marker to place an L on the Penrose drain in the left corpus cavernosum and an R on the Penrose drain in the right corpus cavernosum for identification purposes. The Penrose drains were then all attached together with a sterile safety pin.  The area where the scrotum had eroded appeared to have viable tissue surrounding it so I excised the area where it had eroded and then closed this with running 3-0 chromic suture. I used 3-0 chromic to close the scrotal incision proximally and distally for a short distance allowing the drains to protrude through the incision and then secured to the sterile safety pin to the skin with a 3-0 nylon suture which was placed through the skin and through the round eye of the sterile safety pin. Sterile gauze dressing was applied as well as a fluffed Curlex and scrotal support.  He then underwent his planned orthopedic procedure dictated separately.  He tolerated the portion of the operation that I performed well with no intraoperative complications. Needle, sponge and instrument counts were correct 2 at the end of the operation.

## 2017-02-01 NOTE — Discharge Instructions (Signed)
Penile prosthesis removal postoperative instructions  Wound:  In most cases your incision will have absorbable sutures that will dissolve within the first 10-20 days. Some will fall out even earlier. Expect some redness as the sutures dissolved but this should occur only around the sutures. If there is generalized redness, especially with increasing pain or swelling, let us know. The scrotum and penis will very likely get "black and blue" as the blood in the tissues spread. Sometimes the whole scrotum will turn colors. The black and blue is followed by a yellow and brown color. In time, all the discoloration will go away. In some cases some firm swelling in the area of the testicle and pump may persist for up to 4-6 weeks after the surgery and is considered normal in most cases.  Diet:  You may return to your normal diet within 24 hours following your surgery. You may note some mild nausea and possibly vomiting the first 6-8 hours following surgery. This is usually due to the side effects of anesthesia, and will disappear quite soon. I would suggest clear liquids and a very light meal the first evening following your surgery.  Activity:  Your physical activity should be restricted the first 48 hours. During that time you should remain relatively inactive, moving about only when necessary. During the first 7-10 days following surgery he should avoid lifting any heavy objects (anything greater than 15 pounds), and avoid strenuous exercise. If you work, ask [place] specifically about your restrictions, both for work and home. We will write a note to your employer if needed.  You should plan to wear a tight pair of jockey shorts or an athletic supporter for the first 4-5 days, even to sleep. This will keep the scrotum immobilized to some degree and keep the swelling down.  Ice packs should be placed on and off over the scrotum for the first 48 hours. Frozen peas or corn in a ZipLock bag can be frozen, used  and re-frozen. Fifteen minutes on and 15 minutes off is a reasonable schedule. The ice is a good pain reliever and keeps the swelling down.  Hygiene:  You may shower 48 hours after your surgery. Tub bathing should be restricted until the seventh day.  Medication:  You will be sent home with some type of pain medication. In many cases you will be sent home with a narcotic pain pill (hydrocodone or oxycodone). If the pain is not too bad, you may take either Tylenol (acetaminophen) or Advil (ibuprofen) which contain no narcotic agents, and might be tolerated a little better, with fewer side effects. If the pain medication you are sent home with does not control the pain, you will have to let us know. Some narcotic pain medications cannot be given or refilled by a phone call to a pharmacy.  Problems you should report to [place]:   Fever of 101.0 degrees Fahrenheit or greater.  Moderate or severe swelling under the skin incision or involving the scrotum. Drug reaction such as hives, a rash, nausea or vomiting.

## 2017-02-02 ENCOUNTER — Encounter (HOSPITAL_COMMUNITY): Payer: Self-pay | Admitting: Urology

## 2017-02-02 LAB — CBC
HCT: 31.1 % — ABNORMAL LOW (ref 39.0–52.0)
Hemoglobin: 9.1 g/dL — ABNORMAL LOW (ref 13.0–17.0)
MCH: 24.3 pg — ABNORMAL LOW (ref 26.0–34.0)
MCHC: 29.3 g/dL — AB (ref 30.0–36.0)
MCV: 82.9 fL (ref 78.0–100.0)
PLATELETS: 355 10*3/uL (ref 150–400)
RBC: 3.75 MIL/uL — AB (ref 4.22–5.81)
RDW: 17.6 % — ABNORMAL HIGH (ref 11.5–15.5)
WBC: 10.3 10*3/uL (ref 4.0–10.5)

## 2017-02-02 LAB — PROTIME-INR
INR: 1.34
Prothrombin Time: 16.7 seconds — ABNORMAL HIGH (ref 11.4–15.2)

## 2017-02-02 NOTE — Anesthesia Postprocedure Evaluation (Signed)
Anesthesia Post Note  Patient: Scott [name]  Procedure(s) Performed: Procedure(s) (LRB): REMOVAL OF PENILE PROSTHESIS (N/A) EXCISION RIGHT INDEX METACARPAL HEAD (Right)  Patient location during evaluation: PACU Anesthesia Type: General Level of consciousness: sedated and patient cooperative Pain management: pain level controlled Vital Signs Assessment: post-procedure vital signs reviewed and stable Respiratory status: spontaneous breathing Cardiovascular status: stable Anesthetic complications: no        Last Vitals:  Vitals:   02/01/17 1245 02/01/17 1250  BP:    Pulse: (!) 33   Resp: 20   Temp:  36.5 C    Last Pain:  Vitals:   02/01/17 1250  PainSc: Asleep   Pain Goal:                 Nolon Nations

## 2017-02-03 LAB — PROTIME-INR
INR: 1.5
Prothrombin Time: 18.3 seconds — ABNORMAL HIGH (ref 11.4–15.2)

## 2017-02-04 ENCOUNTER — Encounter: Payer: Self-pay | Admitting: *Deleted

## 2017-02-04 LAB — PROTIME-INR
INR: 1.44
PROTHROMBIN TIME: 17.7 s — AB (ref 11.4–15.2)

## 2017-02-04 NOTE — H&P (Signed)
Physical Medicine and Rehabilitation Admission H&P    CC: Debility.    HPI:  Scott [name] is a 71 year old male with history of T2DM, heart murmur, BPH, RLS, peyronie's disease, bicuspid aortic valve with severe stenosi who was originally admitted to [facility] on today 10/20/16 for cardiac cath to work up 2 month history of chest pain with SOB. He was found to have 3 vessel CAD with borderline critical AS and thoracic ascending aneurysm.   He was taken to OR on 10/22/16 for CABG X 3, AVR, MVR, repair of thoracic ascending aneurysm with placement of VAC on open chest. Post op course significant for cardiogenic shock requiring multiple pressors, SAM requiring removal of mitral annuloplasty ring,  persistent fevers, encephalopathy, shocked liver, DIC due to consumptive intraoperative coagulopathy, diffuse clotting with ischemia of distal BUE/BLE extremities, A fib, C diff colitis, volume overload and VDRF with difficulty with vent wean requiring tracheostomy.  Sternal wound closed on 12/4 and was started on coumadin for RV thrombus and A fib.    He was transferred to [facility] on 12/26 for vent wean.  Hospital course significant for MRSA bacteremia, pneumonitis, intermittent fevers, dysphagia requiring PEG tube placement on 1/15 by Dr. Kathlene Cote. He developed progressive gangrenous changes of BUE and BLE requiring B-transtibial amputation , left transmetacarpal amputation and Right MCP amputation of right hand by Dr. Sharol Given on 1/19.  Has been showing improvement in respiratory status and endurance. He tolerated extubation to ATC and was de-cannulated on 3/12. Tube feeds have been discontinued and diet was been gradually advanced to regular with chopped meats by 3/14.  He developed phimosis with erosion of penile prothesis and purulent drainage and despite antibiotics. Scrotal wound positive for few pseudomonas aeruginosa and moderate bacteroides fragilis therefore antibiotics changed to Zosyn X 1 week. Dr. Karsten Ro  consulted for input  and recommended removal of prosthesis. He has had poor healing of right index MCP. Once INR normalized, he underwent removal of penile prosthesis by Dr. Karsten Ro and amputation of index MC.    Review of Systems  Constitutional: Negative for chills and fever.  HENT: Negative for hearing loss and tinnitus.   Eyes: Negative for blurred vision and double vision.  Respiratory: Negative for cough, sputum production and shortness of breath.   Cardiovascular: Negative for chest pain, palpitations and leg swelling.  Gastrointestinal: Positive for diarrhea. Negative for heartburn.  Genitourinary: Negative for dysuria.  Musculoskeletal: Negative for joint pain and myalgias.  Skin: Negative for itching and rash.  Neurological: Negative for dizziness and headaches.  Psychiatric/Behavioral: Positive for memory loss. The patient is not nervous/anxious and does not have insomnia.       Past Medical History:  Diagnosis Date  . Arthritis    "hips, shoulders; knees; back" (10/20/2016)  . Bicuspid aortic valve   . BPH (benign prostatic hypertrophy)   . Carpal tunnel syndrome of right wrist   . Coronary artery disease involving native coronary artery of native heart with unstable angina pectoris ([place]) 10/20/2016  . Diverticulitis   . GERD (gastroesophageal reflux disease)   . Gout   . Heart murmur   . Hyperlipemia   . Hypertension   . Postoperative atrial fibrillation ([place]) 10/24/2016  . RLS (restless legs syndrome)   . S/P aortic valve replacement with bioprosthetic valve 10/22/2016   25 mm [facility] Ease bovine pericardial bioprosthetic tissue valve  . S/P ascending aortic aneurysm repair 10/22/2016   28 mm supracoronary straight graft replacement of ascending thoracic  aortic aneurysm  . S/P CABG x 3 10/22/2016   Sequential LIMA to Diag and LAD, SVG to distal LAD, open vein harvest right thigh  . S/P mitral valve repair 10/22/2016   Artificial Gore-tex neochord placement  x6 - posterior annuloplasty band placed but removed due to systolic anterior motion of mitral valve  . Severe aortic stenosis   . Snores    Never been tested for sleep apnea  . Thoracic ascending aortic aneurysm ([place]) 10/21/2016  . Thrombocytopenia ([place]) 10/22/2016  . Type II diabetes mellitus ([place])     Past Surgical History:  Procedure Laterality Date  . AMPUTATION Bilateral 12/11/2016   Procedure: AMPUTATION BELOW KNEE BILATERALLY;  Surgeon: [name], MD;  Location: [place];  Service: Orthopedics;  Laterality: Bilateral;  . AMPUTATION Bilateral 12/11/2016   Procedure: AMPUTATION BILATERAL HANDS EXCEPT RIGHT THUMB;  Surgeon: [name], MD;  Location: [place];  Service: Orthopedics;  Laterality: Bilateral;  . AORTIC VALVE REPLACEMENT N/A 10/22/2016   Procedure: AORTIC VALVE REPLACEMENT (AVR) WITH SIZE 25 MM MAGNA EASE PERICARDIAL BIOPROSTHESIS - AORTIC;  Surgeon: Rexene Alberts, MD;  Location: [place];  Service: Open Heart Surgery;  Laterality: N/A;  . BONE EXCISION Right 02/01/2017   Procedure: EXCISION RIGHT INDEX METACARPAL HEAD;  Surgeon: [name], MD;  Location: [place];  Service: Orthopedics;  Laterality: Right;  . CARDIAC CATHETERIZATION N/A 10/20/2016   Procedure: Right/Left Heart Cath and Coronary Angiography;  Surgeon: Leonie Man, MD;  Location: [place] [facility];  Service: Cardiovascular;  Laterality: N/A;  . CARPAL TUNNEL RELEASE Right 11/28/2013   Procedure: RIGHT WRIST CARPAL TUNNEL RELEASE;  Surgeon: Lorn Junes, MD;  Location: [place];  Service: Orthopedics;  Laterality: Right;  . CATARACT EXTRACTION W/ INTRAOCULAR LENS  IMPLANT, BILATERAL Bilateral 1978  . COLONOSCOPY    . CORONARY ARTERY BYPASS GRAFT N/A 10/22/2016   Procedure: CORONARY ARTERY BYPASS GRAFTING (CABG)x 2 WITH LIMA TO DIAGONAL, OPEN  HARVESTING OF RIGHT SAPHENOUS VEIN FOR VEIN GRAFT TO LAD;  Surgeon: Rexene Alberts, MD;  Location: [place];  Service: Open Heart Surgery;   Laterality: N/A;  . FRACTURE SURGERY    . INGUINAL HERNIA REPAIR Right 1998  . IR GENERIC HISTORICAL  12/07/2016   IR US GUIDE VASC ACCESS RIGHT 12/07/2016 Aletta Edouard, MD [facility]  . IR GENERIC HISTORICAL  12/07/2016   IR RADIOLOGY PERIPHERAL GUIDED IV START 12/07/2016 Aletta Edouard, MD [facility]  . IR GENERIC HISTORICAL  12/07/2016   IR GASTROSTOMY TUBE MOD SED 12/07/2016 Aletta Edouard, MD [facility]  . LIPOMA EXCISION Right 2008   "side of my head"  . MITRAL VALVE REPAIR N/A 10/22/2016   Procedure: MITRAL VALVE REPAIR (MVR) WITH SIZE 30 SORIN ANNULOFLEX ANNULOPLASTY RING WITH SUBSEQUENT REMOVAL OF RING;  Surgeon: Rexene Alberts, MD;  Location: [place];  Service: Open Heart Surgery;  Laterality: N/A;  . PENECTOMY  2007   Peyronie's disease   . PENILE PROSTHESIS IMPLANT  2009  . REMOVAL OF PENILE PROSTHESIS N/A 02/01/2017   Procedure: REMOVAL OF PENILE PROSTHESIS;  Surgeon: Kathie Rhodes, MD;  Location: [place];  Service: Urology;  Laterality: N/A;  . SHOULDER OPEN ROTATOR CUFF REPAIR Right 2006  . STERNAL CLOSURE N/A 10/26/2016   Procedure: STERNAL WASHOUT AND DELAYED PRIMARY CLOSURE;  Surgeon: Rexene Alberts, MD;  Location: [place];  Service: Thoracic;  Laterality: N/A;  . TEE WITHOUT CARDIOVERSION N/A 10/26/2016   Procedure: TRANSESOPHAGEAL ECHOCARDIOGRAM (  TEE);  Surgeon: Rexene Alberts, MD;  Location: [place];  Service: Thoracic;  Laterality: N/A;  . TEE WITHOUT CARDIOVERSION N/A 10/22/2016   Procedure: TRANSESOPHAGEAL ECHOCARDIOGRAM (TEE);  Surgeon: Rexene Alberts, MD;  Location: [place];  Service: Open Heart Surgery;  Laterality: N/A;  . THORACIC AORTIC ANEURYSM REPAIR  10/22/2016   Procedure: ASCENDING AORTIC  ANEURYSM REPAIR (AAA) WITH 28 MM HEMASHIELD PLATINUM WOVEN DOUBLE VELOUR VASCULAR GRAFT;  Surgeon: Rexene Alberts, MD;  Location: [place];  Service: Open Heart Surgery;;  . TONSILLECTOMY  ~ 1955  . TRACHEOSTOMY TUBE PLACEMENT N/A 11/09/2016   Procedure: TRACHEOSTOMY;   Surgeon: Rexene Alberts, MD;  Location: [place];  Service: Thoracic;  Laterality: N/A;  . TRANSURETHRAL RESECTION OF PROSTATE  2005  . TRIGGER FINGER RELEASE Bilateral    several lt and rt hands  . TRIGGER FINGER RELEASE Right 11/28/2013   Procedure: RIGHT TRIGGER FINGER  RELEASE (TENDON SHEATH INCISION);  Surgeon: Lorn Junes, MD;  Location: [place];  Service: Orthopedics;  Laterality: Right;  Marland Kitchen VIDEO BRONCHOSCOPY N/A 11/09/2016   Procedure: VIDEO BRONCHOSCOPY;  Surgeon: Rexene Alberts, MD;  Location: [facility] OR;  Service: Thoracic;  Laterality: N/A;  . WRIST FRACTURE SURGERY Right ~ 1959    Family History  Problem Relation Age of Onset  . Lung cancer Mother   . Clotting disorder Father     No details  . Heart disease Sister 56    Stents  . Cancer Sister     Throat    Social History:  Married. Retired from [employer] in [place]. He reports that he quit smoking about 34 years ago. His smoking use included Pipe and Cigars. He quit after 3.00 years of use. He has never used smokeless tobacco. He reports that he was drinking  a beer at nights.  He reports that he does not use drugs.    Allergies  Allergen Reactions  . Doxycycline Hives  . Penicillins Hives     Has patient had a PCN reaction causing immediate rash, facial/tongue/throat swelling, SOB or lightheadedness with hypotension: No Has patient had a PCN reaction causing severe rash involving mucus membranes or skin necrosis: No Has patient had a PCN reaction that required hospitalization: No Has patient had a PCN reaction occurring within the last 10 years:# # # YES # # #  If all of the above answers are "NO", then may proceed with Cephalosporin use.   . Sulfa Antibiotics Hives    Medications Prior to Admission  Medication Sig Dispense Refill  . allopurinol (ZYLOPRIM) 300 MG tablet Take 300 mg by mouth daily.    Marland Kitchen aspirin 81 MG tablet Take 81 mg by mouth 2 (two) times daily.     . Calcium-Magnesium-Zinc  (CAL-MAG-ZINC PO) Take 1-2 tablets by mouth 2 (two) times daily. 1 tablet in the AM and 2 tablets in the PM.    . Cholecalciferol (VITAMIN D3) 1000 units CAPS Take 1,000 Units by mouth at bedtime.     . enalapril (VASOTEC) 20 MG tablet Take 20 mg by mouth 2 (two) times daily.    . finasteride (PROSCAR) 5 MG tablet Take 5 mg by mouth daily.     Marland Kitchen gemfibrozil (LOPID) 600 MG tablet Take 600 mg by mouth 2 (two) times daily before a meal.    . Melatonin 10 MG CAPS Take 10 mg by mouth at bedtime.     . metFORMIN (GLUCOPHAGE-XR) 500 MG 24 hr tablet Take  500 mg by mouth at bedtime.     . metoprolol succinate (TOPROL XL) 25 MG 24 hr tablet Take 1 tablet (25 mg total) by mouth daily. 30 tablet 11  . nitroGLYCERIN (NITROSTAT) 0.4 MG SL tablet Place 0.4 mg under the tongue every 5 (five) minutes as needed.     . Omega-3 Fatty Acids (FISH OIL) 1000 MG CPDR Take 2,000 mg by mouth 2 (two) times daily.     Marland Kitchen rOPINIRole (REQUIP) 2 MG tablet Take 2 mg by mouth See admin instructions. Takes 2mg  at 4:30pm and 2mg  at 9:30pm.    . Turmeric 500 MG CAPS Take 500 mg by mouth 2 (two) times daily.     . vitamin E 400 UNIT capsule Take 400 Units by mouth daily.      Home: Home Living Family/patient expects to be discharged to:: Private residence   Functional History:    Functional Status:  Mobility:          ADL:    Cognition: Cognition Orientation Level: Oriented X4    Blood pressure 103/61, pulse (!) 33, temperature 97.7 F (36.5 C), resp. rate 20, height 5\' 9"  (1.753 m), weight 101 kg (222 lb 10.6 oz), SpO2 97 %. Physical Exam  Nursing note and vitals reviewed. Constitutional: He is oriented to person, place, and time. He appears well-developed and well-nourished.  Pleasant and appropriate  HENT:  Head: Normocephalic and atraumatic.  Eyes: Conjunctivae are normal. Pupils are equal, round, and reactive to light.  Neck: Normal range of motion. Neck supple.  Stoma clean and dry--still open with  air loss during conversation. Blood tinged drainage from around the stoma is evident.   Cardiovascular: Normal rate and regular rhythm.   Respiratory: Effort normal and breath sounds normal. No stridor. No respiratory distress. He has no wheezes.  Air leakage during speech or deep breathing. Pt needs reminders to occlude the trach stoma  GI: Soft. Bowel sounds are normal. He exhibits no distension. There is no tenderness.  PEG site with dried blood around flange.   Genitourinary:  Genitourinary Comments: Foley in place. Scrotum with 1 inch opening and penrose drain--dressing dry (recently changed)  Musculoskeletal:  B-BKA incision with dry scabs--left with dehisced edge medially and right with dehisced edge laterally.   Right MCP site with incision intact and sutures in place.  Multiple dry eschars on bilateral residual tibial and knees, right inner thigh at graft site. Healing abrasion medial thigh.   Neurological: He is alert and oriented to person, place, and time. Coordination abnormal.  Speech clear. Orientation better today but occasional bouts of disorientation/confusion continue. He is able to follow basic commands without difficulty. UE limited by discomfort to an extent. B/L deltoid, biceps, triceps are grossly 3+/5. Wrist and fingers limited by splints and amputations. LE: 2/5 HF, 3/5 KE.   Skin: Skin is warm and dry.  buttocks reddened with healed ulcers. Left BK wound with scabs and without significant drainage. Right BK with necrotic tissue along incision particularly along the lateral aspect of the incision. PEG site intact.   Psychiatric: He has a normal mood and affect. His behavior is normal. Thought content normal.    Results for orders placed or performed during the hospital encounter of 11/18/16 (from the past 48 hour(s))  Protime-INR     Status: Abnormal   Collection Time: 02/04/17  6:23 AM  Result Value Ref Range   Prothrombin Time 17.7 (H) 11.4 - 15.2 seconds   INR  1.44  Protime-INR     Status: Abnormal   Collection Time: 02/05/17  8:35 AM  Result Value Ref Range   Prothrombin Time 18.8 (H) 11.4 - 15.2 seconds   INR 1.56    No results found.     Medical Problem List and Plan: 1.  Deconditioning and functional/mobility deficits secondary to complications after CABG/AVR with subsequent respiratory failure as well as ischemic injury to all 4 limbs which required bilateral BKA's and bilateral TM-C amputations.   -admit to inpatient rehab 2.  RV thrombus/Anticoagulation: Pharmaceutical: Coumadin and Heparin 3. Pain Management: Prn medications effective.  4. Mood: Motivated to get stronger. LCSW to follow for evaluation and support.  5. Neuropsych: This patient is capable of making decisions on his own behalf. 6. Skin/Wound Care: Monitor wounds for healing.  Continue dressing changes to scrotum 4-5 times daily.  -dc foley  7. Fluids/Electrolytes/Nutrition: Monitor intake with I/O. Add supplement to promote healing.  8. CAD s/p AVR: continue IV heparin till INR therapeutic.On ASA, Atenolol and Lipitor.  9. A fib: Monitor HR bid. Continue digoxin, atenolol and amiodarone.  10. B-BKA, Left transmetatarsal and right MCP amputations due to DIC:    -needs wet to dry dressing to right stump. would change to ACE wrap also for now.  11. 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  12. Diarrhea: Improving on lomotil. Will change to prn and recheck for C diff as has been on multiple antibiotics. 13. Anemia of chronic illness: Slowly improving --will continue to monitor for trends.  14. T2DM: Monitor BS ac/hs. Continue lantus insulin and titrate as indicated. Will use SSI for elevated BS. 15. HTN: Monitor BP bid--on lisinopril, lasix, digoxin and atenolol     Post Admission Physician Evaluation: 1. Functional deficits  secondary  to debility and multiple amputations. 2. Patient is admitted to receive collaborative, interdisciplinary care between the physiatrist, rehab nursing staff, and therapy team. 3. Patient's level of medical complexity and substantial therapy needs in context of that medical necessity cannot be provided at a lesser intensity of care such as a SNF. 4. Patient has experienced substantial functional loss from his/her baseline which was documented above under the "Functional History" and "Functional Status" headings.  Judging by the patient's diagnosis, physical exam, and functional history, the patient has potential for functional progress which will result in measurable gains while on inpatient rehab.  These gains will be of substantial and practical use upon discharge  in facilitating mobility and self-care at the household level. 5. Physiatrist will provide 24 hour management of medical needs as well as oversight of the therapy plan/treatment and provide guidance as appropriate regarding the interaction of the two. 6. The Preadmission Screening has been reviewed and patient status is unchanged unless otherwise stated above. 7. 24 hour rehab nursing will assist with bladder management, bowel management, safety, skin/wound care, disease management, medication administration, pain management and patient education  and help integrate therapy concepts, techniques,education, etc. 8. PT will assess and treat for/with: Lower extremity strength, range of motion, stamina, balance, functional mobility, safety, adaptive techniques and equipment, pain mgt, community reintegration, family ed.   Goals are: min to mod assist at w/c level. 9. OT will assess and treat for/with: ADL's, functional mobility, safety, upper extremity strength, adaptive techniques and equipment, wound care, ego support, family education.   Goals are: min to mod assist at w/c level. Therapy may not yet proceed with  showering this  patient. 10. SLP will assess and treat for/with: communication and swallowing, cognition.  Goals are: mod I. 11. Case Management and Social Worker will assess and treat for psychological issues and discharge planning. 12. Team conference will be held weekly to assess progress toward goals and to determine barriers to discharge. 13. Patient will receive at least 3 hours of therapy per day at least 5 days per week. 14. ELOS: 8-11 days       15. Prognosis:  excellent     Meredith Staggers, MD, [place] [facility] 02/05/2017  Bary Leriche, Hershal Coria 02/05/2017

## 2017-02-04 NOTE — PMR Pre-admission (Shared)
[facility] PMR Admission Coordinator Pre-Admission Assessment  Patient: Scott [name] is an 71 y.o., male MRN: [id number] DOB: [birth year] Height: _0  (175.3 cm) Weight: 101 kg (222 lb 10.6 oz)  Insurance Information HMO:     PPO:      PCP:      IPA:      80/20: yes     OTHER: no HMO PRIMARY: Medicare a and b      Policy#: [id number]      Subscriber: pt Benefits:  Phone #: passport one online 02/01/2017 Eff. Date:     Deduct: $1340      Out of Pocket Max: none      Life Max: none CIR: 100%       SNF: 20 full days Outpatient: 80%     Co-Pay: 20% Home Health: 100%      Co-Pay: none DME: 80%     Co-Pay: 20% Providers: pt choice SECONDARY: [facility]      Policy#: [id number]      Subscriber: pt Pt hospitalized [facility] 10/20/2016 until 11/18/2016 and admitted to [facility] 11/18/17 until 02/05/2017 for a total of 110 days. Has used 19 days of LIFETIME reserve days. I have reviewed this with wife and daughter, [name], who state understanding.  Medicaid Application Date:       Case Manager:  Disability Application Date:       Case Worker:   Emergency Contact Information Contact Information    Name Relation Home Work Mobile   [place] Spouse [phone]  [phone]   [place]a A[place] Daughter   [phone]   Cuthbertson,Amy Daughter   [phone]      Current Medical History  Patient Admitting Diagnosis: debility due to complex medical  History of Present Illness: 71 year old man with no previous history of CAD but known heart murmur and risk factors notable for type @ DM, hyperlipidemia and remote history of tobacco abuse who was referred for surgical consultation for management of severe symptomatic aortic stenosis and CAD with unstable angina pectoris. Patient admitted 10/20/2016 for cardiac catherization. Due to the severity of his anatomical disease and severe/critical aortic stenosis his best option was felt to be CABG with AVR. Underwent surgery  10/22/2016. The procedure was complicated by a severe intraoperative coagulopathy.   The patient had a prolonged and difficult postoperative course. Critical Care medicine was consulted and pt tracheid with extensive efforts to wean. The advanced Heart Team was also consulted to assist with management. It was initially presumed pt had HITT however it was later felt that the severe coagulopathy was more of an issue related to DIC. He was treated with ANgiomax for a term. ON 10/26/2016 he returned to the OR for closure of his sternum. He remained vent dependent and required trach.   During the early course of his hospitalization he developed severe ischemic extremities and developed necrotic portions of bilateral hand and feet.   ID was consulted and pt treated for possible endocarditis. Additionally he developed C. Diff and treated with VANC.   The patient did develop skin edge necrosis of his right thigh wound requiring debridement and placement of a VAC on 11/17/2016. Pt was then stabilized and admitted to [facility] on 11/18/2016.  At [facility], patient had progressive gangrenous changes to both feet and hands. Pt taken to OR on 12/11/2016 and with Dr. Sharol Given performed transtibial amputations bilaterally, application of VAC to both, left transmetatarsal carpal amputation of hand, right MCP amputation of hand,  injection bilateral transtibial amputations with TXA 50 ml per amp. The right thumb was viable and an amputation was performed through the MCP joint for the index to left finger.  Dr. Karsten Ro on 02/01/2017 took pt to OR due to erosion over the pump on the lateral aspect of the right hemiscrotum from his penile prosthesis which was originally implanted on 4/09. Removal of three piece inflatable penile prosthesis with 16 french foley catheter, quarter inch penrose drains places in the proximal and distal corpus cavernosum, half inch penrose drain in the previous reservoir site.   Throughout pt's stay at  [place], pt gradually weaned to trach collar from vent and pt eventually de cannulated trach on 02/01/2017 postoperatively.   Patient's medical record from  [facility] has been reviewed by the rehabilitation admission coordinator and physician.  NIH Stroke scale: Glascow Coma Scale:  Past Medical History  Past Medical History:  Diagnosis Date  . Arthritis    "hips, shoulders; knees; back" (10/20/2016)  . Bicuspid aortic valve   . BPH (benign prostatic hypertrophy)   . Carpal tunnel syndrome of right wrist   . Coronary artery disease involving native coronary artery of native heart with unstable angina pectoris ([place]) 10/20/2016  . Diverticulitis   . GERD (gastroesophageal reflux disease)   . Gout   . Heart murmur   . Hyperlipemia   . Hypertension   . Postoperative atrial fibrillation ([place]) 10/24/2016  . RLS (restless legs syndrome)   . S/P aortic valve replacement with bioprosthetic valve 10/22/2016   25 mm [facility] Ease bovine pericardial bioprosthetic tissue valve  . S/P ascending aortic aneurysm repair 10/22/2016   28 mm supracoronary straight graft replacement of ascending thoracic aortic aneurysm  . S/P CABG x 3 10/22/2016   Sequential LIMA to Diag and LAD, SVG to distal LAD, open vein harvest right thigh  . S/P mitral valve repair 10/22/2016   Artificial Gore-tex neochord placement x6 - posterior annuloplasty band placed but removed due to systolic anterior motion of mitral valve  . Severe aortic stenosis   . Snores    Never been tested for sleep apnea  . Thoracic ascending aortic aneurysm ([place]) 10/21/2016  . Thrombocytopenia ([place]) 10/22/2016  . Type II diabetes mellitus (H[facility])     Family History   family history includes Cancer in his sister; Clotting disorder in his father; Heart disease (age of onset: 80) in his sister; Lung cancer in his mother.  Prior Rehab/Hospitalizations Has the patient had major surgery during 100 days prior to admission?  No    Current Medications Refer to [facility]  Patients Current Diet:  Regular diet with thin liquids  Precautions / Restrictions Precautions Precautions/Special Needs: Swallowing Precaution Comments: trach decannulation 02/01/17 Restrictions Weight Bearing Restrictions: Yes Other Position/Activity Restrictions: no longer has sternal precautions   Has the patient had 2 or more falls or a fall with injury in the past year?No  Prior Activity Level Community (5-7x/wk): Independent, retired, and driving pta 35/3614  Prior Functional Level Self Care: Did the patient need help bathing, dressing, using the toilet or eating?  Independent  Indoor Mobility: Did the patient need assistance with walking from room to room (with or without device)? Independent  Stairs: Did the patient need assistance with internal or external stairs (with or without device)? Independent  Functional Cognition: Did the patient need help planning regular tasks such as shopping or remembering to take medications? Independent  Home Assistive Devices / Equipment Home Assistive Devices/Equipment: Eyeglasses  Prior  Device Use: Indicate devices/aids used by the patient prior to current illness, exacerbation or injury? None of the above   Prior Functional Level Current Functional Level  Bed Mobility  Independent  Mod assist   Transfers  Independent  Total assist   Mobility - Walk/Wheelchair  Independent   (not attempted)   Upper Body Dressing  Independent  Total assist   Lower Body Dressing  Independent  Total assist   Grooming  Independent  Mod assist   Eating/Drinking  Independent  Mod assist   Toilet Transfer  Independent  Total assist   Bladder Continence   continent  foley placed 02/01/2017 after surgical removal of penile implant   Bowel Management  continent  continent   Stair Climbing   Independent   (unable due to B BKA)   Communication  independent  independent   Memory   intact  intact   Cooking/Meal Prep  independent      Housework  independent    Money Management  independent    Driving   yes      Special needs/care consideration BiPAP/CPAP  N/a CPM  N/a Continuous Drip IV  N/a Dialysis  N/a Life Vest  N/a Oxygen  N/a Special Bed*** Trach Size  de cannulated 02/01/2017 Wound Vac (area  N/a Skin Sacrum/bilateral buttocks reddened, scrotum advance penrose drain 2 cm daily and wearing net briefs,, surgical 02/01/2017 coban dressing to right hand surgical site change daily beginning 02/07/2107, bilateral BKA surgical incisions site with scab area to site Bowel mgmt:  continent Bladder mgmt: indwelling catheter placed 02/01/2017 during surgery Diabetic mgmt  Yes  Previous Home Environment Living Arrangements: Spouse/significant other  Lives With: Spouse Available Help at Discharge: Family, Available 24 hours/day (wife, two daughters and son very committed) Type of Home: House Home Layout: Two level, 1/2 bath on main level (will set up bedroom downstairs in dining room) Home Access: Stairs to enter Entrance Stairs-Rails: None Entrance Stairs-Number of Steps: 2 steps to porch then one step in (wife met with contractor for ramp on 02/03/2017) Bathroom Shower/Tub: Tub/shower unit (upstairs) Bathroom Toilet: Standard Bathroom Accessibility: Yes How Accessible:  (upstairs)  Discharge Living Setting Plans for Discharge Living Setting: Patient's home, Lives with (comment) (wife) Type of Home at Discharge: House Discharge Home Layout: Two level, 1/2 bath on main level (setting up bedroom downstairs in dining room) Discharge Home Access: Stairs to enter Entrance Stairs-Rails: None Entrance Stairs-Number of Steps: 2 step entry onto porch and then one step into home (wife met with ramp builder 02/03/2017) Discharge Bathroom Shower/Tub: Tub/shower unit (upstairs) Discharge Bathroom Toilet: Standard Discharge Bathroom Accessibility: Yes How  Accessible: Accessible via walker (upstairs) Does the patient have any problems obtaining your medications?: No   Pt's daughter, [name], has offered for her parents to move in with her for she has a handicapped home, wheelchair accessible, she bought from a previous owner.  Social/Family/Support Systems Patient Roles: Spouse, Parent Contact Information: Colvin Blatt, wife Anticipated Caregiver: wife, two daughters and son Anticipated Caregiver's Contact Information: [name] cell [phone] Ability/Limitations of Caregiver: no limitations for wife and daughters Caregiver Availability: 24/7 Discharge Plan Discussed with Primary Caregiver: Yes Is Caregiver In Agreement with Plan?: Yes Does Caregiver/Family have Issues with Lodging/Transportation while Pt is in Rehab?: No  Wife very involved and hands on, but she wants to assure that home health will be arranged to assist her with wound/skin care management issues at discharge. Goals/Additional Needs Patient/Family Goal for Rehab: min- mod assist with PT and  OT at wheelchair level, supervision to Mod I with SLP Expected length of stay: [facility] 7- 10 days Equipment Needs: motorized wheelchair, BSC, Zalma for follow up wound care Special Service Needs: all amputations are new this admission.  (At [facility] since 09/2017) Pt/Family Agrees to Admission and willing to participate: Yes Program Orientation Provided & Reviewed with Pt/Caregiver Including Roles  & Responsibilities: Yes  Patient Condition: Patient's [facility] and [facility] medical records have been reviewed, as well as onsite assessment of patient and interviews with patient and family. Patient will benefit form a comprehensive inpatient admission and will benefit from our coordinated team approach. He will receive 3 hours of PT, OT, and SLP daily along with rehabilitative Medical and Nursing Care. Patient is overall max to total assist. We will admit pt to inpt rehab  today.   Preadmission Screen Completed By:  Cleatrice Burke, 02/04/2017 10:22 PM ______________________________________________________________________   Discussed status with Dr. Marland Kitchen on *** at *** and received telephone approval for admission today.  Admission Coordinator:  Cleatrice Burke, time Marland KitchenSudie Grumbling ***   Assessment/Plan: Diagnosis: 1. Does the need for close, 24 hr/day  Medical supervision in concert with the patient's rehab needs make it unreasonable for this patient to be served in a less intensive setting? {yes_no_potentially:3041433} 2. Co-Morbidities requiring supervision/potential complications: *** 3. Due to {due AC:1660630}, does the patient require 24 hr/day rehab nursing? {yes_no_potentially:3041433} 4. Does the patient require coordinated care of a physician, rehab nurse, {coordinated ZSWF:0932355} to address physical and functional deficits in the context of the above medical diagnosis(es)? {yes_no_potentially:3041433} Addressing deficits in the following areas: {deficits:3041436} 5. Can the patient actively participate in an intensive therapy program of at least 3 hrs of therapy 5 days a week? {yes_no_potentially:3041433} 6. The potential for patient to make measurable gains while on inpatient rehab is {potential:3041437} 7. Anticipated functional outcomes upon discharge from inpatients are: {functional outcomes:304600100} PT, {functional outcomes:304600100} OT, {functional outcomes:304600100} SLP 8. Estimated rehab length of stay to reach the above functional goals is: *** 9. Does the patient have adequate social supports to accommodate these discharge functional goals? {yes_no_potentially:3041433} 10. Anticipated D/C setting: {anticipated dc setting:21604} 11. Anticipated post D/C treatments: {post dc treatment:21605} 12. Overall Rehab/Functional Prognosis: {potential:3041437}    RECOMMENDATIONS: This patient's condition is appropriate for continued  rehabilitative care in the following setting: {appropriate setting:21606} Patient has agreed to participate in recommended program. {yes_no_potentially:3041433} Note that insurance prior authorization may be required for reimbursement for recommended care.  Comment:  Cleatrice Burke 02/04/2017

## 2017-02-05 ENCOUNTER — Encounter (HOSPITAL_COMMUNITY): Payer: Self-pay | Admitting: Nurse Practitioner

## 2017-02-05 ENCOUNTER — Inpatient Hospital Stay (HOSPITAL_COMMUNITY)
Admission: RE | Admit: 2017-02-05 | Discharge: 2017-02-19 | DRG: 948 | Disposition: A | Discharge status: Home Health | Payer: Medicare Other | Source: Other Acute Inpatient Hospital | Attending: Physical Medicine & Rehabilitation | Admitting: Physical Medicine & Rehabilitation

## 2017-02-05 DIAGNOSIS — I110 Hypertensive heart disease with heart failure: Secondary | ICD-10-CM | POA: Diagnosis present

## 2017-02-05 DIAGNOSIS — Z882 Allergy status to sulfonamides status: Secondary | ICD-10-CM | POA: Diagnosis not present

## 2017-02-05 DIAGNOSIS — T8131XS Disruption of external operation (surgical) wound, not elsewhere classified, sequela: Secondary | ICD-10-CM

## 2017-02-05 DIAGNOSIS — E1165 Type 2 diabetes mellitus with hyperglycemia: Secondary | ICD-10-CM | POA: Diagnosis not present

## 2017-02-05 DIAGNOSIS — R5381 Other malaise: Principal | ICD-10-CM | POA: Diagnosis present

## 2017-02-05 DIAGNOSIS — Z9841 Cataract extraction status, right eye: Secondary | ICD-10-CM

## 2017-02-05 DIAGNOSIS — D638 Anemia in other chronic diseases classified elsewhere: Secondary | ICD-10-CM | POA: Diagnosis present

## 2017-02-05 DIAGNOSIS — Z881 Allergy status to other antibiotic agents status: Secondary | ICD-10-CM

## 2017-02-05 DIAGNOSIS — I82603 Acute embolism and thrombosis of unspecified veins of upper extremity, bilateral: Secondary | ICD-10-CM | POA: Diagnosis present

## 2017-02-05 DIAGNOSIS — Z7982 Long term (current) use of aspirin: Secondary | ICD-10-CM

## 2017-02-05 DIAGNOSIS — Z931 Gastrostomy status: Secondary | ICD-10-CM | POA: Diagnosis not present

## 2017-02-05 DIAGNOSIS — Z79899 Other long term (current) drug therapy: Secondary | ICD-10-CM

## 2017-02-05 DIAGNOSIS — I739 Peripheral vascular disease, unspecified: Secondary | ICD-10-CM | POA: Diagnosis not present

## 2017-02-05 DIAGNOSIS — Z9842 Cataract extraction status, left eye: Secondary | ICD-10-CM | POA: Diagnosis not present

## 2017-02-05 DIAGNOSIS — I4891 Unspecified atrial fibrillation: Secondary | ICD-10-CM | POA: Diagnosis present

## 2017-02-05 DIAGNOSIS — Z880 Allergy status to penicillin: Secondary | ICD-10-CM

## 2017-02-05 DIAGNOSIS — Z801 Family history of malignant neoplasm of trachea, bronchus and lung: Secondary | ICD-10-CM

## 2017-02-05 DIAGNOSIS — K219 Gastro-esophageal reflux disease without esophagitis: Secondary | ICD-10-CM | POA: Diagnosis present

## 2017-02-05 DIAGNOSIS — Z7984 Long term (current) use of oral hypoglycemic drugs: Secondary | ICD-10-CM | POA: Diagnosis not present

## 2017-02-05 DIAGNOSIS — Z87891 Personal history of nicotine dependence: Secondary | ICD-10-CM | POA: Diagnosis not present

## 2017-02-05 DIAGNOSIS — Z951 Presence of aortocoronary bypass graft: Secondary | ICD-10-CM

## 2017-02-05 DIAGNOSIS — E876 Hypokalemia: Secondary | ICD-10-CM | POA: Diagnosis not present

## 2017-02-05 DIAGNOSIS — I350 Nonrheumatic aortic (valve) stenosis: Secondary | ICD-10-CM | POA: Diagnosis not present

## 2017-02-05 DIAGNOSIS — Z9889 Other specified postprocedural states: Secondary | ICD-10-CM

## 2017-02-05 DIAGNOSIS — N183 Chronic kidney disease, stage 3 (moderate): Secondary | ICD-10-CM | POA: Diagnosis not present

## 2017-02-05 DIAGNOSIS — I2510 Atherosclerotic heart disease of native coronary artery without angina pectoris: Secondary | ICD-10-CM | POA: Diagnosis present

## 2017-02-05 DIAGNOSIS — I10 Essential (primary) hypertension: Secondary | ICD-10-CM | POA: Diagnosis not present

## 2017-02-05 DIAGNOSIS — Z930 Tracheostomy status: Secondary | ICD-10-CM | POA: Diagnosis not present

## 2017-02-05 DIAGNOSIS — E119 Type 2 diabetes mellitus without complications: Secondary | ICD-10-CM | POA: Diagnosis present

## 2017-02-05 DIAGNOSIS — E1122 Type 2 diabetes mellitus with diabetic chronic kidney disease: Secondary | ICD-10-CM | POA: Diagnosis not present

## 2017-02-05 DIAGNOSIS — B965 Pseudomonas (aeruginosa) (mallei) (pseudomallei) as the cause of diseases classified elsewhere: Secondary | ICD-10-CM | POA: Diagnosis present

## 2017-02-05 DIAGNOSIS — Z794 Long term (current) use of insulin: Secondary | ICD-10-CM | POA: Diagnosis not present

## 2017-02-05 DIAGNOSIS — Z961 Presence of intraocular lens: Secondary | ICD-10-CM | POA: Diagnosis present

## 2017-02-05 DIAGNOSIS — D65 Disseminated intravascular coagulation [defibrination syndrome]: Secondary | ICD-10-CM | POA: Diagnosis present

## 2017-02-05 DIAGNOSIS — Z953 Presence of xenogenic heart valve: Secondary | ICD-10-CM | POA: Diagnosis not present

## 2017-02-05 DIAGNOSIS — Z89511 Acquired absence of right leg below knee: Secondary | ICD-10-CM

## 2017-02-05 DIAGNOSIS — E785 Hyperlipidemia, unspecified: Secondary | ICD-10-CM | POA: Diagnosis present

## 2017-02-05 DIAGNOSIS — I5042 Chronic combined systolic (congestive) and diastolic (congestive) heart failure: Secondary | ICD-10-CM | POA: Diagnosis present

## 2017-02-05 DIAGNOSIS — T8130XD Disruption of wound, unspecified, subsequent encounter: Secondary | ICD-10-CM

## 2017-02-05 DIAGNOSIS — N400 Enlarged prostate without lower urinary tract symptoms: Secondary | ICD-10-CM | POA: Diagnosis present

## 2017-02-05 DIAGNOSIS — Z89512 Acquired absence of left leg below knee: Secondary | ICD-10-CM

## 2017-02-05 DIAGNOSIS — Z89111 Acquired absence of right hand: Secondary | ICD-10-CM

## 2017-02-05 DIAGNOSIS — T8361XD Infection and inflammatory reaction due to implanted penile prosthesis, subsequent encounter: Secondary | ICD-10-CM | POA: Diagnosis not present

## 2017-02-05 DIAGNOSIS — G2581 Restless legs syndrome: Secondary | ICD-10-CM | POA: Diagnosis present

## 2017-02-05 DIAGNOSIS — I9789 Other postprocedural complications and disorders of the circulatory system, not elsewhere classified: Secondary | ICD-10-CM

## 2017-02-05 DIAGNOSIS — I5041 Acute combined systolic (congestive) and diastolic (congestive) heart failure: Secondary | ICD-10-CM | POA: Diagnosis present

## 2017-02-05 DIAGNOSIS — D62 Acute posthemorrhagic anemia: Secondary | ICD-10-CM | POA: Diagnosis present

## 2017-02-05 DIAGNOSIS — Z89112 Acquired absence of left hand: Secondary | ICD-10-CM

## 2017-02-05 DIAGNOSIS — Z4781 Encounter for orthopedic aftercare following surgical amputation: Secondary | ICD-10-CM

## 2017-02-05 LAB — GLUCOSE, CAPILLARY
GLUCOSE-CAPILLARY: 154 mg/dL — AB (ref 65–99)
Glucose-Capillary: 108 mg/dL — ABNORMAL HIGH (ref 65–99)

## 2017-02-05 LAB — CBC WITH DIFFERENTIAL/PLATELET
BASOS PCT: 0 %
Basophils Absolute: 0 10*3/uL (ref 0.0–0.1)
Eosinophils Absolute: 0.3 10*3/uL (ref 0.0–0.7)
Eosinophils Relative: 3 %
HEMATOCRIT: 28.5 % — AB (ref 39.0–52.0)
HEMOGLOBIN: 8.4 g/dL — AB (ref 13.0–17.0)
LYMPHS ABS: 1.5 10*3/uL (ref 0.7–4.0)
Lymphocytes Relative: 13 %
MCH: 24.6 pg — ABNORMAL LOW (ref 26.0–34.0)
MCHC: 29.5 g/dL — AB (ref 30.0–36.0)
MCV: 83.3 fL (ref 78.0–100.0)
MONOS PCT: 7 %
Monocytes Absolute: 0.8 10*3/uL (ref 0.1–1.0)
NEUTROS PCT: 77 %
Neutro Abs: 8.3 10*3/uL — ABNORMAL HIGH (ref 1.7–7.7)
Platelets: 357 10*3/uL (ref 150–400)
RBC: 3.42 MIL/uL — AB (ref 4.22–5.81)
RDW: 18.5 % — ABNORMAL HIGH (ref 11.5–15.5)
WBC: 10.9 10*3/uL — AB (ref 4.0–10.5)

## 2017-02-05 LAB — C DIFFICILE QUICK SCREEN W PCR REFLEX
C DIFFICILE (CDIFF) TOXIN: NEGATIVE
C DIFFICLE (CDIFF) ANTIGEN: NEGATIVE
C Diff interpretation: NOT DETECTED

## 2017-02-05 LAB — MRSA PCR SCREENING: MRSA BY PCR: POSITIVE — AB

## 2017-02-05 LAB — PROTIME-INR
INR: 1.56
Prothrombin Time: 18.8 seconds — ABNORMAL HIGH (ref 11.4–15.2)

## 2017-02-05 LAB — DIGOXIN LEVEL: DIGOXIN LVL: 0.4 ng/mL — AB (ref 0.8–2.0)

## 2017-02-05 MED ORDER — FERROUS SULFATE 325 (65 FE) MG PO TABS
325.0000 mg | ORAL_TABLET | Freq: Two times a day (BID) | ORAL | Status: DC
  Administered 2017-02-05 – 2017-02-19 (×28): 325 mg via ORAL
  Filled 2017-02-05 (×28): qty 1

## 2017-02-05 MED ORDER — DIPHENHYDRAMINE HCL 12.5 MG/5ML PO ELIX: 12.5000 mg | ORAL_SOLUTION | Freq: Four times a day (QID) | ORAL | Status: DC | PRN

## 2017-02-05 MED ORDER — DIGOXIN 125 MCG PO TABS
0.1250 mg | ORAL_TABLET | Freq: Every day | ORAL | Status: DC
  Administered 2017-02-06 – 2017-02-19 (×14): 0.125 mg via ORAL
  Filled 2017-02-05 (×14): qty 1

## 2017-02-05 MED ORDER — OXYCODONE HCL 5 MG PO TABS
7.5000 mg | ORAL_TABLET | ORAL | Status: DC | PRN
  Administered 2017-02-07 – 2017-02-18 (×4): 7.5 mg via ORAL
  Filled 2017-02-05 (×4): qty 2

## 2017-02-05 MED ORDER — ATORVASTATIN CALCIUM 40 MG PO TABS
40.0000 mg | ORAL_TABLET | Freq: Every day | ORAL | Status: DC
  Administered 2017-02-05 – 2017-02-18 (×14): 40 mg via ORAL
  Filled 2017-02-05 (×14): qty 1

## 2017-02-05 MED ORDER — ZINC SULFATE 220 (50 ZN) MG PO CAPS
220.0000 mg | ORAL_CAPSULE | Freq: Two times a day (BID) | ORAL | Status: DC
  Administered 2017-02-05 – 2017-02-19 (×27): 220 mg via ORAL
  Filled 2017-02-05 (×29): qty 1

## 2017-02-05 MED ORDER — ATENOLOL 25 MG PO TABS
25.0000 mg | ORAL_TABLET | Freq: Every day | ORAL | Status: DC
  Administered 2017-02-06 – 2017-02-19 (×12): 25 mg via ORAL
  Filled 2017-02-05 (×14): qty 1

## 2017-02-05 MED ORDER — INSULIN ASPART 100 UNIT/ML ~~LOC~~ SOLN
0.0000 [IU] | Freq: Every day | SUBCUTANEOUS | Status: DC
  Administered 2017-02-13: 2 [IU] via SUBCUTANEOUS
  Administered 2017-02-18: 0 [IU] via SUBCUTANEOUS

## 2017-02-05 MED ORDER — VITAMIN C 500 MG PO TABS
500.0000 mg | ORAL_TABLET | Freq: Every day | ORAL | Status: DC
  Administered 2017-02-06 – 2017-02-19 (×14): 500 mg via ORAL
  Filled 2017-02-05 (×14): qty 1

## 2017-02-05 MED ORDER — ASPIRIN EC 81 MG PO TBEC
81.0000 mg | DELAYED_RELEASE_TABLET | Freq: Every day | ORAL | Status: DC
  Administered 2017-02-06 – 2017-02-19 (×14): 81 mg via ORAL
  Filled 2017-02-05 (×14): qty 1

## 2017-02-05 MED ORDER — INSULIN ASPART 100 UNIT/ML ~~LOC~~ SOLN
0.0000 [IU] | Freq: Three times a day (TID) | SUBCUTANEOUS | Status: DC
  Administered 2017-02-07: 2 [IU] via SUBCUTANEOUS
  Administered 2017-02-08 – 2017-02-10 (×3): 1 [IU] via SUBCUTANEOUS
  Administered 2017-02-11 – 2017-02-12 (×2): 2 [IU] via SUBCUTANEOUS
  Administered 2017-02-15 – 2017-02-17 (×2): 1 [IU] via SUBCUTANEOUS

## 2017-02-05 MED ORDER — FUROSEMIDE 40 MG PO TABS
40.0000 mg | ORAL_TABLET | Freq: Two times a day (BID) | ORAL | Status: DC
  Administered 2017-02-05 – 2017-02-19 (×28): 40 mg via ORAL
  Filled 2017-02-05 (×28): qty 1

## 2017-02-05 MED ORDER — PROCHLORPERAZINE MALEATE 5 MG PO TABS: 5.0000 mg | ORAL_TABLET | Freq: Four times a day (QID) | ORAL | Status: DC | PRN

## 2017-02-05 MED ORDER — SERTRALINE HCL 50 MG PO TABS
75.0000 mg | ORAL_TABLET | Freq: Every day | ORAL | Status: DC
  Administered 2017-02-06 – 2017-02-19 (×14): 75 mg via ORAL
  Filled 2017-02-05 (×14): qty 2

## 2017-02-05 MED ORDER — ALUM & MAG HYDROXIDE-SIMETH 200-200-20 MG/5ML PO SUSP: 30.0000 mL | ORAL | Status: DC | PRN

## 2017-02-05 MED ORDER — LISINOPRIL 2.5 MG PO TABS
2.5000 mg | ORAL_TABLET | Freq: Every day | ORAL | Status: DC
  Administered 2017-02-06 – 2017-02-19 (×13): 2.5 mg via ORAL
  Filled 2017-02-05 (×14): qty 1

## 2017-02-05 MED ORDER — AMIODARONE HCL 100 MG PO TABS
100.0000 mg | ORAL_TABLET | Freq: Every day | ORAL | Status: DC
  Administered 2017-02-06 – 2017-02-19 (×14): 100 mg via ORAL
  Filled 2017-02-05 (×14): qty 1

## 2017-02-05 MED ORDER — INSULIN GLARGINE 100 UNIT/ML ~~LOC~~ SOLN
5.0000 [IU] | Freq: Two times a day (BID) | SUBCUTANEOUS | Status: DC
  Administered 2017-02-05 – 2017-02-19 (×28): 5 [IU] via SUBCUTANEOUS
  Filled 2017-02-05 (×29): qty 0.05

## 2017-02-05 MED ORDER — NON FORMULARY: 3.0000 mg | Freq: Every day | Status: DC

## 2017-02-05 MED ORDER — ACETAMINOPHEN 325 MG PO TABS: 325.0000 mg | ORAL_TABLET | ORAL | Status: DC | PRN

## 2017-02-05 MED ORDER — MELATONIN 3 MG PO TABS
3.0000 mg | ORAL_TABLET | Freq: Every day | ORAL | Status: DC
  Administered 2017-02-05 – 2017-02-18 (×13): 3 mg via ORAL
  Filled 2017-02-05 (×14): qty 1

## 2017-02-05 MED ORDER — WARFARIN - PHARMACIST DOSING INPATIENT
Freq: Every day | Status: DC
  Administered 2017-02-06 – 2017-02-18 (×4)

## 2017-02-05 MED ORDER — PROCHLORPERAZINE 25 MG RE SUPP: 12.5000 mg | Freq: Four times a day (QID) | RECTAL | Status: DC | PRN

## 2017-02-05 MED ORDER — IPRATROPIUM-ALBUTEROL 0.5-2.5 (3) MG/3ML IN SOLN: 3.0000 mL | Freq: Four times a day (QID) | RESPIRATORY_TRACT | Status: DC | PRN

## 2017-02-05 MED ORDER — ZOLPIDEM TARTRATE 5 MG PO TABS
5.0000 mg | ORAL_TABLET | Freq: Every day | ORAL | Status: DC
  Administered 2017-02-05 – 2017-02-18 (×14): 5 mg via ORAL
  Filled 2017-02-05 (×14): qty 1

## 2017-02-05 MED ORDER — DIPHENOXYLATE-ATROPINE 2.5-0.025 MG PO TABS
1.0000 | ORAL_TABLET | Freq: Four times a day (QID) | ORAL | Status: DC | PRN
  Administered 2017-02-06 – 2017-02-09 (×3): 1 via ORAL
  Filled 2017-02-05 (×3): qty 1

## 2017-02-05 MED ORDER — POLYETHYLENE GLYCOL 3350 17 G PO PACK: 17.0000 g | PACK | Freq: Every day | ORAL | Status: DC | PRN

## 2017-02-05 MED ORDER — HYDROCERIN EX CREA
TOPICAL_CREAM | Freq: Two times a day (BID) | CUTANEOUS | Status: DC
  Administered 2017-02-05 – 2017-02-11 (×9): via TOPICAL
  Administered 2017-02-11: 1 via TOPICAL
  Administered 2017-02-12 – 2017-02-19 (×11): via TOPICAL
  Filled 2017-02-05: qty 113

## 2017-02-05 MED ORDER — BISACODYL 10 MG RE SUPP: 10.0000 mg | Freq: Every day | RECTAL | Status: DC | PRN

## 2017-02-05 MED ORDER — GABAPENTIN 300 MG PO CAPS
300.0000 mg | ORAL_CAPSULE | Freq: Two times a day (BID) | ORAL | Status: DC
  Administered 2017-02-05 – 2017-02-19 (×28): 300 mg via ORAL
  Filled 2017-02-05 (×28): qty 1

## 2017-02-05 MED ORDER — ENOXAPARIN SODIUM 80 MG/0.8ML ~~LOC~~ SOLN
70.0000 mg | Freq: Two times a day (BID) | SUBCUTANEOUS | Status: DC
Start: 2017-02-05 — End: 2017-02-07
  Administered 2017-02-05 – 2017-02-07 (×4): 70 mg via SUBCUTANEOUS
  Filled 2017-02-05 (×4): qty 0.8

## 2017-02-05 MED ORDER — FLEET ENEMA 7-19 GM/118ML RE ENEM: 1.0000 | ENEMA | Freq: Once | RECTAL | Status: DC | PRN

## 2017-02-05 MED ORDER — PROCHLORPERAZINE EDISYLATE 5 MG/ML IJ SOLN: 5.0000 mg | Freq: Four times a day (QID) | INTRAMUSCULAR | Status: DC | PRN

## 2017-02-05 MED ORDER — CIPROFLOXACIN HCL 500 MG PO TABS
500.0000 mg | ORAL_TABLET | Freq: Two times a day (BID) | ORAL | Status: AC
  Administered 2017-02-05 – 2017-02-14 (×19): 500 mg via ORAL
  Filled 2017-02-05 (×19): qty 1

## 2017-02-05 MED ORDER — FAMOTIDINE 20 MG PO TABS
20.0000 mg | ORAL_TABLET | Freq: Two times a day (BID) | ORAL | Status: DC
Start: 2017-02-05 — End: 2017-02-19
  Administered 2017-02-05 – 2017-02-19 (×28): 20 mg via ORAL
  Filled 2017-02-05 (×28): qty 1

## 2017-02-05 MED ORDER — WARFARIN SODIUM 5 MG PO TABS
5.0000 mg | ORAL_TABLET | Freq: Every day | ORAL | Status: DC
  Administered 2017-02-05 – 2017-02-09 (×5): 5 mg via ORAL
  Filled 2017-02-05 (×5): qty 1

## 2017-02-05 MED ORDER — GUAIFENESIN-DM 100-10 MG/5ML PO SYRP
5.0000 mL | ORAL_SOLUTION | Freq: Four times a day (QID) | ORAL | Status: DC | PRN
Start: 2017-02-05 — End: 2017-02-19

## 2017-02-05 MED ORDER — ROPINIROLE HCL 1 MG PO TABS
2.0000 mg | ORAL_TABLET | Freq: Every day | ORAL | Status: DC
  Administered 2017-02-05 – 2017-02-18 (×14): 2 mg via ORAL
  Filled 2017-02-05 (×14): qty 2

## 2017-02-05 NOTE — Progress Notes (Signed)
He reports he is doing well with no complaints of pain.  His white blood cell count remains normal. The scrotum is healing without any sign of erythema. Serous sanguinous drainage is present on the dressing which has persisted but it does not purulent. His Penrose drains are being advanced appropriately. He has a Foley catheter indwelling that can now be removed.  My understanding is that he is going to rehabilitation. His Foley will be removed today and he will continue to have his Penrose drains advanced until they fall out. From that point on half strength hydrogen peroxide irrigation of the wound twice a day with dry dressing changes twice a day and when necessary.

## 2017-02-05 NOTE — PMR Pre-admission (Signed)
Scott Gong, RN Rehab Admission Coordinator [facility]  PMR Pre-admission Encounter Date: 02/04/2017  Related encounter: Documentation from 02/04/2017 in [facility]       _0 Hide copied text _1 Hover for attribution information   Secondary Market PMR Admission Coordinator Pre-Admission Assessment  Patient: Scott [name] is an 71 y.o., male MRN: [id number] DOB: [birth year] Height: _2  (175.3 cm) Weight: 101 kg (222 lb 10.6 oz)  Insurance Information HMO:     PPO:      PCP:      IPA:      80/20: yes     OTHER: no HMO PRIMARY: Medicare a and b      Policy#: [id number]      Subscriber: pt Benefits:  Phone #: passport one online 02/01/2017 Eff. Date:     Deduct: $1340      Out of Pocket Max: none      Life Max: none CIR: 100%       SNF: 20 full days Outpatient: 80%     Co-Pay: 20% Home Health: 100%      Co-Pay: none DME: 80%     Co-Pay: 20% Providers: pt choice  SECONDARY: [place]      Policy#: [id number]      Subscriber: pt Pt hospitalized [facility] 10/20/2016 until 11/18/2016 and admitted to [facility] 11/18/17 until 02/05/2017 for a total of 110 days. Has used 19 days of LIFETIME reserve days. I have reviewed this with wife and daughter, Scott [name], who state understanding.  Medicaid Application Date:       Case Manager:  Disability Application Date:       Case Worker:   Emergency Contact Information        Contact Information    Name Relation Home Work Mobile   [place] Spouse [phone]  [phone]   [place] Daughter   [phone]   Cuthbertson,Amy Daughter   [phone]      Current Medical History  Patient Admitting Diagnosis: debility due to complex medical  History of Present Illness: 71 year old man with no previous history of CAD but known heart murmur and risk factors notable for type @ DM, hyperlipidemia and remote history of tobacco abuse who was  referred for surgical consultation for management of severe symptomatic aortic stenosis and CAD with unstable angina pectoris. Patient admitted 10/20/2016 for cardiac catherization. Due to the severity of his anatomical disease and severe/critical aortic stenosis his best option was felt to be CABG with AVR. Underwent surgery 10/22/2016. The procedure was complicated by a severe intraoperative coagulopathy.   The patient had a prolonged and difficult postoperative course. Critical Care medicine was consulted and pt trached with extensive efforts to wean. The advanced Heart Team was also consulted to assist with management. It was initially presumed pt had HITT however it was later felt that the severe coagulopathy was more of an issue related to DIC. He was treated with ANgiomax for a term. ON 10/26/2016 he returned to the OR for closure of his sternum. He remained vent dependent and required trach.   During the early course of his hospitalization he developed severe ischemic extremities and developed necrotic portions of bilateral hand and feet.   ID was consulted and pt treated for possible endocarditis. Additionally he developed C. Diff and treated with VANC.   The patient did develop skin edge necrosis of his right thigh wound requiring debridement and placement of a VAC on 11/17/2016. Pt was then  stabilized and admitted to [facility] on 11/18/2016.  At [facility], patient had progressive gangrenous changes to both feet and hands. Pt taken to OR on 12/11/2016 and with Dr. Sharol Given performed transtibial amputations bilaterally, application of VAC to both, left transmetacarpal amputation of hand, right MCP amputation of hand, injection bilateral transtibial amputations with TXA 50 ml per amp. The right thumb was viable and an amputation was performed through the MCP joint for the index to left finger.  Dr. Karsten Ro on 02/01/2017 took pt to OR due to erosion over the pump on the lateral aspect of the right  hemiscrotum from his penile prosthesis which was originally implanted on 4/09. Removal of three piece inflatable penile prosthesis with 16 french foley catheter, quarter inch penrose drains places in the proximal and distal corpus cavernosum, half inch penrose drain in the previous reservoir site.   Throughout pt's stay at [facility], pt gradually weaned to trach collar from vent and pt eventually de cannulated trach on 02/01/2017 postoperatively.   Patient's medical record from  [facility] has been reviewed by the rehabilitation admission coordinator and physician.  NIH Stroke scale: Glascow Coma Scale:  Past Medical History      Past Medical History:  Diagnosis Date  . Arthritis    "hips, shoulders; knees; back" (10/20/2016)  . Bicuspid aortic valve   . BPH (benign prostatic hypertrophy)   . Carpal tunnel syndrome of right wrist   . Coronary artery disease involving native coronary artery of native heart with unstable angina pectoris ([place]) 10/20/2016  . Diverticulitis   . GERD (gastroesophageal reflux disease)   . Gout   . Heart murmur   . Hyperlipemia   . Hypertension   . Postoperative atrial fibrillation ([place]) 10/24/2016  . RLS (restless legs syndrome)   . S/P aortic valve replacement with bioprosthetic valve 10/22/2016   25 mm [facility] Ease bovine pericardial bioprosthetic tissue valve  . S/P ascending aortic aneurysm repair 10/22/2016   28 mm supracoronary straight graft replacement of ascending thoracic aortic aneurysm  . S/P CABG x 3 10/22/2016   Sequential LIMA to Diag and LAD, SVG to distal LAD, open vein harvest right thigh  . S/P mitral valve repair 10/22/2016   Artificial Gore-tex neochord placement x6 - posterior annuloplasty band placed but removed due to systolic anterior motion of mitral valve  . Severe aortic stenosis   . Snores    Never been tested for sleep apnea  . Thoracic ascending aortic aneurysm ([place]) 10/21/2016    . Thrombocytopenia ([place]) 10/22/2016  . Type II diabetes mellitus (HCC)     Family History   family history includes Cancer in his sister; Clotting disorder in his father; Heart disease (age of onset: 53) in his sister; Lung cancer in his mother.  Prior Rehab/Hospitalizations Has the patient had major surgery during 100 days prior to admission? No               Current Medications Refer to [facility]  Patients Current Diet:  Regular diet with thin liquids  Precautions / Restrictions Precautions Precautions/Special Needs: Swallowing Precaution Comments: trach decannulation 02/01/17 Restrictions Weight Bearing Restrictions: Yes Other Position/Activity Restrictions: no longer has sternal precautions   Has the patient had 2 or more falls or a fall with injury in the past year?No  Prior Activity Level Community (5-7x/wk): Independent, retired, and driving pta 92/1194  Prior Functional Level Self Care: Did the patient need help bathing, dressing, using the toilet or eating?  Independent  Indoor  Mobility: Did the patient need assistance with walking from room to room (with or without device)? Independent  Stairs: Did the patient need assistance with internal or external stairs (with or without device)? Independent  Functional Cognition: Did the patient need help planning regular tasks such as shopping or remembering to take medications? Independent  Home Assistive Devices / Equipment Home Assistive Devices/Equipment: Eyeglasses  Prior Device Use: Indicate devices/aids used by the patient prior to current illness, exacerbation or injury? None of the above   Prior Functional Level Current Functional Level  Bed Mobility  Independent  Mod assist   Transfers  Independent  Total assist   Mobility - Walk/Wheelchair  Independent   (not attempted)   Upper Body Dressing  Independent  Total assist   Lower Body Dressing  Independent  Total  assist   Grooming  Independent  Mod assist   Eating/Drinking  Independent  Mod assist   Toilet Transfer  Independent  Total assist   Bladder Continence   continent  foley placed 02/01/2017 after surgical removal of penile implant   Bowel Management  continent  continent   Stair Climbing   Independent   (unable due to B BKA)   Communication  independent  independent   Memory  intact  intact   Cooking/Meal Prep  independent      Housework  independent    Money Management  independent    Driving   yes      Special needs/care consideration BiPAP/CPAP  N/a CPM  N/a Continuous Drip IV  N/a Dialysis  N/a Life Vest  N/a Oxygen  N/a Special Bed On a dophin bed on [facility] Trach Size  de cannulated 02/01/2017 Wound Vac N/a Skin:  Sacrum/bilateral buttocks reddened, scrotum advance penrose drain 2 cm daily and wearing net briefs,, surgical 02/01/2017 coban dressing to right hand surgical site change daily beginning 02/07/2107, bilateral BKA surgical incisions site with scab area to site Bowel mgmt:  continent, Last BM 02/04/17 Bladder mgmt: indwelling catheter placed 02/01/2017 during surgery Diabetic mgmt  Yes  Previous Home Environment Living Arrangements: Spouse/significant other  Lives With: Spouse Available Help at Discharge: Family, Available 24 hours/day (wife, two daughters and son very committed) Type of Home: House Home Layout: Two level, 1/2 bath on main level (will set up bedroom downstairs in dining room) Home Access: Stairs to enter Entrance Stairs-Rails: None Entrance Stairs-Number of Steps: 2 steps to porch then one step in (wife met with contractor for ramp on 02/03/2017) Bathroom Shower/Tub: Tub/shower unit (upstairs) Bathroom Toilet: Standard Bathroom Accessibility: Yes How Accessible:  (upstairs)  Discharge Living Setting Plans for Discharge Living Setting: Patient's home, Lives with (comment)  (wife) Type of Home at Discharge: House Discharge Home Layout: Two level, 1/2 bath on main level (setting up bedroom downstairs in dining room) Discharge Home Access: Stairs to enter Entrance Stairs-Rails: None Entrance Stairs-Number of Steps: 2 step entry onto porch and then one step into home (wife met with ramp builder 02/03/2017) Discharge Bathroom Shower/Tub: Tub/shower unit (upstairs) Discharge Bathroom Toilet: Standard Discharge Bathroom Accessibility: Yes How Accessible: Accessible via walker (upstairs) Does the patient have any problems obtaining your medications?: No   Pt's daughter, Scott [name], has offered for her parents to move in with her for she has a handicapped home, wheelchair accessible, she bought from a previous owner.  Social/Family/Support Systems Patient Roles: Spouse, Parent Contact Information: Lenox Bink, wife Anticipated Caregiver: wife, two daughters and son Anticipated Caregiver's Contact Information: [name] cell [phone] Ability/Limitations of Caregiver: no  limitations for wife and daughters Caregiver Availability: 24/7 Discharge Plan Discussed with Primary Caregiver: Yes Is Caregiver In Agreement with Plan?: Yes Does Caregiver/Family have Issues with Lodging/Transportation while Pt is in Rehab?: No  Wife very involved and hands on, but she wants to assure that home health will be arranged to assist her with wound/skin care management issues at discharge.  Goals/Additional Needs Patient/Family Goal for Rehab: min- mod assist with PT and OT at wheelchair level, supervision to Mod I with SLP Expected length of stay: ELOS 7- 10 days Equipment Needs: motorized wheelchair, BSC, [place] for follow up wound care Special Service Needs: all amputations are new this admission.  (At [facility] since 09/2017) Pt/Family Agrees to Admission and willing to participate: Yes Program Orientation Provided & Reviewed with Pt/Caregiver Including  Roles  & Responsibilities: Yes  Patient Condition: Patient's [facility] and [facility] medical records have been reviewed, as well as onsite assessment of patient and interviews with patient and family. Patient will benefit form a comprehensive inpatient admission and will benefit from our coordinated team approach. He will receive 3 hours of PT, OT, and SLP daily along with rehabilitative Medical and Nursing Care. Patient is overall max to total assist. We will admit pt to inpt rehab today.   Preadmission Screen Completed By:  Cleatrice Burke, 02/04/2017 10:22 PM ______________________________________________________________________   Discussed status with Dr. Naaman Plummer on 02/05/17 at 67 and received telephone approval for admission today.  Admission Coordinator:  Cleatrice Burke, time 1026/Date 02/05/17   Assessment/Plan: Diagnosis: Functional and mobility deficits secondary to debility and multiple ischemic limbs after CABG/AVR. S/P bilateral BKA's and bilateral TMC amputations 1. Does the need for close, 24 hr/day  Medical supervision in concert with the patient's rehab needs make it unreasonable for this patient to be served in a less intensive setting? Yes 2. Co-Morbidities requiring supervision/potential complications: wound care, pain, pulmonary mgt, nutrition 3. Due to bladder management, bowel management, safety, skin/wound care, disease management, medication administration, pain management and patient education, does the patient require 24 hr/day rehab nursing? Yes 4. Does the patient require coordinated care of a physician, rehab nurse, PT (1-2 hrs/day, 5 days/week), OT (1-2 hrs/day, 5 days/week) and SLP (1-2 hrs/day, 5 days/week) to address physical and functional deficits in the context of the above medical diagnosis(es)? Yes Addressing deficits in the following areas: balance, endurance, locomotion, strength, transferring, bowel/bladder control, bathing, dressing, feeding,  grooming, toileting, cognition, speech, swallowing and psychosocial support 5. Can the patient actively participate in an intensive therapy program of at least 3 hrs of therapy 5 days a week? Yes 6. The potential for patient to make measurable gains while on inpatient rehab is excellent 7. Anticipated functional outcomes upon discharge from inpatients are: min assist and mod assist PT, min assist and mod assist OT, modified independent SLP---w/c level goals 8. Estimated rehab length of stay to reach the above functional goals is: 8-11 days 9. Does the patient have adequate social supports to accommodate these discharge functional goals? Yes 10. Anticipated D/C setting: Home 11. Anticipated post D/C treatments: HH therapy and Outpatient therapy 12. Overall Rehab/Functional Prognosis: excellent    RECOMMENDATIONS: This patient's condition is appropriate for continued rehabilitative care in the following setting: CIR Patient has agreed to participate in recommended program. Yes Note that insurance prior authorization may be required for reimbursement for recommended care.  Comment: Admit to inpatient rehab today  Meredith Staggers, MD, [facility] [facility] 02/05/2017  Cleatrice Burke 02/04/2017

## 2017-02-05 NOTE — Progress Notes (Signed)
Patient information reviewed and entered into eRehab system by Abril Cappiello, RN, CRRN, PPS Coordinator.  Information including medical coding and functional independence measure will be reviewed and updated through discharge.     Per nursing patient was given "Data Collection Information Summary for Patients in Inpatient Rehabilitation Facilities with attached "Privacy Act Statement-Health Care Records" upon admission.  

## 2017-02-05 NOTE — Progress Notes (Signed)
Patient ID: ZEDRICK SPRINGSTEEN, male   DOB: Jun 27, 1946, 71 y.o.   MRN: [id number] Patient admitted to [phone] via bed, escorted by nursing staff and family.  Patient and family verbalized understanding of rehab process.  Patient has MASD to bottom into groin-barrier cream applied.  Multiple scabbed areas to right and left knees from previous ulceration left open to air.  Incision to right stump open at edges with yellow slough- dressed with xeroform gauze and special stump sock.  Incision to left stump with open area at edges and slightly boggy dressed with xeroform gauze and special stump sock.  Patient denies pain.  Scabbed area to right inner thigh from graft site, foam dressing applied, and small linear area with yellow slough on posterior thigh, foam dressing applied.  Family at bedside.  Appears in no distress at this time.  Brita Romp, RN

## 2017-02-05 NOTE — Progress Notes (Signed)
ANTICOAGULATION CONSULT NOTE - Initial Consult  Pharmacy Consult for Lovenox / Coumadin Indication: Afib / RV thrombus  Allergies  Allergen Reactions  . Doxycycline Hives  . Penicillins Hives     Has patient had a PCN reaction causing immediate rash, facial/tongue/throat swelling, SOB or lightheadedness with hypotension: No Has patient had a PCN reaction causing severe rash involving mucus membranes or skin necrosis: No Has patient had a PCN reaction that required hospitalization: No Has patient had a PCN reaction occurring within the last 10 years:# # # YES # # #  If all of the above answers are "NO", then may proceed with Cephalosporin use.   Scott [name] Antibiotics Hives    Patient Measurements: Weight: 160 lb 11.2 oz (72.9 kg)   Vital Signs: Temp: 98.2 F (36.8 C) (03/16 1704) Temp Source: Oral (03/16 1704) BP: 106/62 (03/16 1704) Pulse Rate: 52 (03/16 1704)  Labs:  Recent Labs  02/03/17 1044 02/04/17 0623 02/05/17 0835  LABPROT 18.3* 17.7* 18.8*  INR 1.50 1.44 1.56    Estimated Creatinine Clearance: 85.9 mL/min (A) (by C-G formula based on SCr of 0.53 mg/dL (L)).   Medical History: Past Medical History:  Diagnosis Date  . Arthritis    "hips, shoulders; knees; back" (10/20/2016)  . Bicuspid aortic valve   . BPH (benign prostatic hypertrophy)   . Carpal tunnel syndrome of right wrist   . Coronary artery disease involving native coronary artery of native heart with unstable angina pectoris ([place]) 10/20/2016  . Diverticulitis   . GERD (gastroesophageal reflux disease)   . Gout   . Heart murmur   . Hyperlipemia   . Hypertension   . Postoperative atrial fibrillation ([place]) 10/24/2016  . RLS (restless legs syndrome)   . S/P aortic valve replacement with bioprosthetic valve 10/22/2016   25 mm [facility] Ease bovine pericardial bioprosthetic tissue valve  . S/P ascending aortic aneurysm repair 10/22/2016   28 mm supracoronary straight graft replacement of  ascending thoracic aortic aneurysm  . S/P CABG x 3 10/22/2016   Sequential LIMA to Diag and LAD, SVG to distal LAD, open vein harvest right thigh  . S/P mitral valve repair 10/22/2016   Artificial Gore-tex neochord placement x6 - posterior annuloplasty band placed but removed due to systolic anterior motion of mitral valve  . Severe aortic stenosis   . Snores    Never been tested for sleep apnea  . Thoracic ascending aortic aneurysm ([place]) 10/21/2016  . Thrombocytopenia ([place]) 10/22/2016  . Type II diabetes mellitus ([place])     Assessment: Transferred from [facility] after long hospital course including CABG x 3/AVR/MVR/repair of aneurysm on 10/22/16 with placement of VAC with multiple complications, heparin and warfarin started at that time for Afib and thrombus.  At [facility] developed phimosis with removal of penile prosthesis and warfarin held for surgery.  Now on rehab to restart anti-coagulation  Goal of Therapy:  INR 2-3 Monitor platelets by anticoagulation protocol: Yes   Plan:  Lovenox 70 mg sq Q 12 hours Coumadin 5 mg po daily for now Daily INR  Thank you Anette Guarneri, PharmD [phone]  02/05/2017,5:15 PM

## 2017-02-05 NOTE — H&P (Addendum)
Physical Medicine and Rehabilitation Admission H&P    CC: Debility.    HPI:  Scott [name] is a 71 year old male with history of T2DM, heart murmur, BPH, RLS, peyronie's disease, bicuspid aortic valve with severe stenosi who was originally admitted to [facility] on today 10/20/16 for cardiac cath to work up 2 month history of chest pain with SOB. He was found to have 3 vessel CAD with borderline critical AS and thoracic ascending aneurysm.   He was taken to OR on 10/22/16 for CABG X 3, AVR, MVR, repair of thoracic ascending aneurysm with placement of VAC on open chest. Post op course significant for cardiogenic shock requiring multiple pressors, SAM requiring removal of mitral annuloplasty ring,  persistent fevers, encephalopathy, shocked liver, DIC due to consumptive intraoperative coagulopathy, diffuse clotting with ischemia of distal BUE/BLE extremities, A fib, C diff colitis, volume overload and VDRF with difficulty with vent wean requiring tracheostomy.  Sternal wound closed on 12/4 and was started on coumadin for RV thrombus and A fib.    He was transferred to [facility] on 12/26 for vent wean.  Hospital course significant for MRSA bacteremia, pneumonitis, intermittent fevers, dysphagia requiring PEG tube placement on 1/15 by Dr. Kathlene Cote. He developed progressive gangrenous changes of BUE and BLE requiring B-transtibial amputation , left transmetacarpal amputation and Right MCP amputation of right hand by Dr. Sharol Given on 1/19.  Has been showing improvement in respiratory status and endurance. He tolerated extubation to ATC and was de-cannulated on 3/12. Tube feeds have been discontinued and diet was been gradually advanced to regular with chopped meats by 3/14.  He developed phimosis with erosion of penile prothesis and purulent drainage and despite antibiotics. Scrotal wound positive for few pseudomonas aeruginosa and moderate bacteroides fragilis therefore antibiotics changed to Zosyn X 1 week. Dr. Karsten Ro  consulted for input  and recommended removal of prosthesis. He has had poor healing of right index MCP. Once INR normalized, he underwent removal of penile prosthesis by Dr. Karsten Ro and amputation of index MC.    Review of Systems  Constitutional: Negative for chills and fever.  HENT: Negative for hearing loss and tinnitus.   Eyes: Negative for blurred vision and double vision.  Respiratory: Negative for cough, sputum production and shortness of breath.   Cardiovascular: Negative for chest pain, palpitations and leg swelling.  Gastrointestinal: Positive for diarrhea. Negative for heartburn.  Genitourinary: Negative for dysuria.  Musculoskeletal: Negative for joint pain and myalgias.  Skin: Negative for itching and rash.  Neurological: Negative for dizziness and headaches.  Psychiatric/Behavioral: Positive for memory loss. The patient is not nervous/anxious and does not have insomnia.       Past Medical History:  Diagnosis Date  . Arthritis    "hips, shoulders; knees; back" (10/20/2016)  . Bicuspid aortic valve   . BPH (benign prostatic hypertrophy)   . Carpal tunnel syndrome of right wrist   . Coronary artery disease involving native coronary artery of native heart with unstable angina pectoris ([place]) 10/20/2016  . Diverticulitis   . GERD (gastroesophageal reflux disease)   . Gout   . Heart murmur   . Hyperlipemia   . Hypertension   . Postoperative atrial fibrillation ([place]) 10/24/2016  . RLS (restless legs syndrome)   . S/P aortic valve replacement with bioprosthetic valve 10/22/2016   25 mm [facility] Ease bovine pericardial bioprosthetic tissue valve  . S/P ascending aortic aneurysm repair 10/22/2016   28 mm supracoronary straight graft replacement of ascending thoracic  aortic aneurysm  . S/P CABG x 3 10/22/2016   Sequential LIMA to Diag and LAD, SVG to distal LAD, open vein harvest right thigh  . S/P mitral valve repair 10/22/2016   Artificial Gore-tex neochord placement  x6 - posterior annuloplasty band placed but removed due to systolic anterior motion of mitral valve  . Severe aortic stenosis   . Snores    Never been tested for sleep apnea  . Thoracic ascending aortic aneurysm ([place]) 10/21/2016  . Thrombocytopenia ([place]) 10/22/2016  . Type II diabetes mellitus ([place])     Past Surgical History:  Procedure Laterality Date  . AMPUTATION Bilateral 12/11/2016   Procedure: AMPUTATION BELOW KNEE BILATERALLY;  Surgeon: [name], MD;  Location: [place];  Service: Orthopedics;  Laterality: Bilateral;  . AMPUTATION Bilateral 12/11/2016   Procedure: AMPUTATION BILATERAL HANDS EXCEPT RIGHT THUMB;  Surgeon: [name], MD;  Location: [place];  Service: Orthopedics;  Laterality: Bilateral;  . AORTIC VALVE REPLACEMENT N/A 10/22/2016   Procedure: AORTIC VALVE REPLACEMENT (AVR) WITH SIZE 25 MM MAGNA EASE PERICARDIAL BIOPROSTHESIS - AORTIC;  Surgeon: Rexene Alberts, MD;  Location: [place];  Service: Open Heart Surgery;  Laterality: N/A;  . BONE EXCISION Right 02/01/2017   Procedure: EXCISION RIGHT INDEX METACARPAL HEAD;  Surgeon: [name], MD;  Location: [place];  Service: Orthopedics;  Laterality: Right;  . CARDIAC CATHETERIZATION N/A 10/20/2016   Procedure: Right/Left Heart Cath and Coronary Angiography;  Surgeon: Leonie Man, MD;  Location: [place] [facility];  Service: Cardiovascular;  Laterality: N/A;  . CARPAL TUNNEL RELEASE Right 11/28/2013   Procedure: RIGHT WRIST CARPAL TUNNEL RELEASE;  Surgeon: Lorn Junes, MD;  Location: [place];  Service: Orthopedics;  Laterality: Right;  . CATARACT EXTRACTION W/ INTRAOCULAR LENS  IMPLANT, BILATERAL Bilateral 1978  . COLONOSCOPY    . CORONARY ARTERY BYPASS GRAFT N/A 10/22/2016   Procedure: CORONARY ARTERY BYPASS GRAFTING (CABG)x 2 WITH LIMA TO DIAGONAL, OPEN  HARVESTING OF RIGHT SAPHENOUS VEIN FOR VEIN GRAFT TO LAD;  Surgeon: Rexene Alberts, MD;  Location: [place];  Service: Open Heart Surgery;   Laterality: N/A;  . FRACTURE SURGERY    . INGUINAL HERNIA REPAIR Right 1998  . IR GENERIC HISTORICAL  12/07/2016   IR US GUIDE VASC ACCESS RIGHT 12/07/2016 Aletta Edouard, MD [facility]  . IR GENERIC HISTORICAL  12/07/2016   IR RADIOLOGY PERIPHERAL GUIDED IV START 12/07/2016 Aletta Edouard, MD [facility]  . IR GENERIC HISTORICAL  12/07/2016   IR GASTROSTOMY TUBE MOD SED 12/07/2016 Aletta Edouard, MD [facility]  . LIPOMA EXCISION Right 2008   "side of my head"  . MITRAL VALVE REPAIR N/A 10/22/2016   Procedure: MITRAL VALVE REPAIR (MVR) WITH SIZE 30 SORIN ANNULOFLEX ANNULOPLASTY RING WITH SUBSEQUENT REMOVAL OF RING;  Surgeon: Rexene Alberts, MD;  Location: [place];  Service: Open Heart Surgery;  Laterality: N/A;  . PENECTOMY  2007   Peyronie's disease   . PENILE PROSTHESIS IMPLANT  2009  . REMOVAL OF PENILE PROSTHESIS N/A 02/01/2017   Procedure: REMOVAL OF PENILE PROSTHESIS;  Surgeon: Kathie Rhodes, MD;  Location: [place];  Service: Urology;  Laterality: N/A;  . SHOULDER OPEN ROTATOR CUFF REPAIR Right 2006  . STERNAL CLOSURE N/A 10/26/2016   Procedure: STERNAL WASHOUT AND DELAYED PRIMARY CLOSURE;  Surgeon: Rexene Alberts, MD;  Location: [place];  Service: Thoracic;  Laterality: N/A;  . TEE WITHOUT CARDIOVERSION N/A 10/26/2016   Procedure: TRANSESOPHAGEAL ECHOCARDIOGRAM (  TEE);  Surgeon: Rexene Alberts, MD;  Location: [place];  Service: Thoracic;  Laterality: N/A;  . TEE WITHOUT CARDIOVERSION N/A 10/22/2016   Procedure: TRANSESOPHAGEAL ECHOCARDIOGRAM (TEE);  Surgeon: Rexene Alberts, MD;  Location: [place];  Service: Open Heart Surgery;  Laterality: N/A;  . THORACIC AORTIC ANEURYSM REPAIR  10/22/2016   Procedure: ASCENDING AORTIC  ANEURYSM REPAIR (AAA) WITH 28 MM HEMASHIELD PLATINUM WOVEN DOUBLE VELOUR VASCULAR GRAFT;  Surgeon: Rexene Alberts, MD;  Location: [place];  Service: Open Heart Surgery;;  . TONSILLECTOMY  ~ 1955  . TRACHEOSTOMY TUBE PLACEMENT N/A 11/09/2016   Procedure: TRACHEOSTOMY;   Surgeon: Rexene Alberts, MD;  Location: [place];  Service: Thoracic;  Laterality: N/A;  . TRANSURETHRAL RESECTION OF PROSTATE  2005  . TRIGGER FINGER RELEASE Bilateral    several lt and rt hands  . TRIGGER FINGER RELEASE Right 11/28/2013   Procedure: RIGHT TRIGGER FINGER  RELEASE (TENDON SHEATH INCISION);  Surgeon: Lorn Junes, MD;  Location: [place];  Service: Orthopedics;  Laterality: Right;  Marland Kitchen VIDEO BRONCHOSCOPY N/A 11/09/2016   Procedure: VIDEO BRONCHOSCOPY;  Surgeon: Rexene Alberts, MD;  Location: [facility] OR;  Service: Thoracic;  Laterality: N/A;  . WRIST FRACTURE SURGERY Right ~ 1959    Family History  Problem Relation Age of Onset  . Lung cancer Mother   . Clotting disorder Father     No details  . Heart disease Sister 11    Stents  . Cancer Sister     Throat    Social History:  Married. Retired from [employer] in [place]. He reports that he quit smoking about 34 years ago. His smoking use included Pipe and Cigars. He quit after 3.00 years of use. He has never used smokeless tobacco. He reports that he was drinking  a beer at nights.  He reports that he does not use drugs.    Allergies  Allergen Reactions  . Doxycycline Hives  . Penicillins Hives     Has patient had a PCN reaction causing immediate rash, facial/tongue/throat swelling, SOB or lightheadedness with hypotension: No Has patient had a PCN reaction causing severe rash involving mucus membranes or skin necrosis: No Has patient had a PCN reaction that required hospitalization: No Has patient had a PCN reaction occurring within the last 10 years:# # # YES # # #  If all of the above answers are "NO", then may proceed with Cephalosporin use.   . Sulfa Antibiotics Hives    Medications Prior to Admission  Medication Sig Dispense Refill  . allopurinol (ZYLOPRIM) 300 MG tablet Take 300 mg by mouth daily.    Marland Kitchen aspirin 81 MG tablet Take 81 mg by mouth 2 (two) times daily.     . Calcium-Magnesium-Zinc  (CAL-MAG-ZINC PO) Take 1-2 tablets by mouth 2 (two) times daily. 1 tablet in the AM and 2 tablets in the PM.    . Cholecalciferol (VITAMIN D3) 1000 units CAPS Take 1,000 Units by mouth at bedtime.     . enalapril (VASOTEC) 20 MG tablet Take 20 mg by mouth 2 (two) times daily.    . finasteride (PROSCAR) 5 MG tablet Take 5 mg by mouth daily.     Marland Kitchen gemfibrozil (LOPID) 600 MG tablet Take 600 mg by mouth 2 (two) times daily before a meal.    . Melatonin 10 MG CAPS Take 10 mg by mouth at bedtime.     . metFORMIN (GLUCOPHAGE-XR) 500 MG 24 hr tablet Take  500 mg by mouth at bedtime.     . metoprolol succinate (TOPROL XL) 25 MG 24 hr tablet Take 1 tablet (25 mg total) by mouth daily. 30 tablet 11  . nitroGLYCERIN (NITROSTAT) 0.4 MG SL tablet Place 0.4 mg under the tongue every 5 (five) minutes as needed.     . Omega-3 Fatty Acids (FISH OIL) 1000 MG CPDR Take 2,000 mg by mouth 2 (two) times daily.     Marland Kitchen rOPINIRole (REQUIP) 2 MG tablet Take 2 mg by mouth See admin instructions. Takes 2mg  at 4:30pm and 2mg  at 9:30pm.    . Turmeric 500 MG CAPS Take 500 mg by mouth 2 (two) times daily.     . vitamin E 400 UNIT capsule Take 400 Units by mouth daily.      Home:     Functional History:    Functional Status:  Mobility: total assist for bed mobility and transfers          ADL: mod-total assist with basic bathing and dressing tasks    Cognition:      There were no vitals taken for this visit. Physical Exam  Nursing note and vitals reviewed. Constitutional: He is oriented to person, place, and time. He appears well-developed and well-nourished.  Pleasant and appropriate  HENT:  Head: Normocephalic and atraumatic.  Eyes: Conjunctivae are normal. Pupils are equal, round, and reactive to light.  Neck: Normal range of motion. Neck supple.  Stoma clean and dry--still open with air loss during conversation. Blood tinged drainage from around the stoma is evident.   Cardiovascular: Normal rate and  regular rhythm.   Respiratory: Effort normal and breath sounds normal. No stridor. No respiratory distress. He has no wheezes.  Air leakage during speech or deep breathing. Pt needs reminders to occlude the trach stoma  GI: Soft. Bowel sounds are normal. He exhibits no distension. There is no tenderness.  PEG site with dried blood around flange.   Genitourinary:  Genitourinary Comments: Foley in place. Scrotum with 1 inch opening and penrose drain--dressing dry (recently changed)  Musculoskeletal:  B-BKA incision with dry scabs--left with dehisced edge medially and right with dehisced edge laterally.   Right MCP site with incision intact and sutures in place.  Multiple dry eschars on bilateral residual tibial and knees, right inner thigh at graft site. Healing abrasion medial thigh.   Neurological: He is alert and oriented to person, place, and time. Coordination abnormal.  Speech clear. Orientation better today but occasional bouts of disorientation/confusion continue. He is able to follow basic commands without difficulty. UE limited by discomfort to an extent. B/L deltoid, biceps, triceps are grossly 3+/5. Wrist and fingers limited by splints and amputations. LE: 2/5 HF, 3/5 KE.   Skin: Skin is warm and dry.  buttocks reddened with healed ulcers. Left BK wound with scabs and without significant drainage. Right BK with necrotic tissue along incision particularly along the lateral aspect of the incision. PEG site intact.   Psychiatric: He has a normal mood and affect. His behavior is normal. Thought content normal.    Results for orders placed or performed during the hospital encounter of 11/18/16 (from the past 48 hour(s))  Protime-INR     Status: Abnormal   Collection Time: 02/04/17  6:23 AM  Result Value Ref Range   Prothrombin Time 17.7 (H) 11.4 - 15.2 seconds   INR 1.44   Protime-INR     Status: Abnormal   Collection Time: 02/05/17  8:35 AM  Result Value  Ref Range   Prothrombin  Time 18.8 (H) 11.4 - 15.2 seconds   INR 1.56    No results found.     Medical Problem List and Plan: 1.  Deconditioning and functional/mobility deficits secondary to complications after CABG/AVR with subsequent respiratory failure as well as ischemic injury to all 4 limbs which required bilateral BKA's and bilateral TM-C amputations.   -admit to inpatient rehab 2.  RV thrombus/Anticoagulation: Pharmaceutical: Coumadin and Heparin 3. Pain Management: Prn medications effective.  4. Mood: Motivated to get stronger. LCSW to follow for evaluation and support.  5. Neuropsych: This patient is capable of making decisions on his own behalf. 6. Skin/Wound Care: Monitor wounds for healing.  Continue dressing changes to scrotum 4-5 times daily.  -dc foley  7. Fluids/Electrolytes/Nutrition: Monitor intake with I/O. Add supplement to promote healing.  8. CAD s/p AVR: continue IV heparin till INR therapeutic.On ASA, Atenolol and Lipitor.  9. A fib: Monitor HR bid. Continue digoxin, atenolol and amiodarone.  10. B-BKA, Left transmetatarsal and right MCP amputations due to DIC:    -needs wet to dry dressing to right stump. would change to ACE wrap also for now.  11. 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  12. Diarrhea: Improving on lomotil. Will change to prn and recheck for C diff as has been on multiple antibiotics. 13. Anemia of chronic illness: Slowly improving --will continue to monitor for trends.  14. T2DM: Monitor BS ac/hs. Continue lantus insulin and titrate as indicated. Will use SSI for elevated BS. 15. HTN: Monitor BP bid--on lisinopril, lasix, digoxin and atenolol     Post Admission Physician Evaluation: 1. Functional deficits secondary  to debility and multiple amputations. 2. Patient is admitted to receive collaborative, interdisciplinary care  between the physiatrist, rehab nursing staff, and therapy team. 3. Patient's level of medical complexity and substantial therapy needs in context of that medical necessity cannot be provided at a lesser intensity of care such as a SNF. 4. Patient has experienced substantial functional loss from his/her baseline which was documented above under the "Functional History" and "Functional Status" headings.  Judging by the patient's diagnosis, physical exam, and functional history, the patient has potential for functional progress which will result in measurable gains while on inpatient rehab.  These gains will be of substantial and practical use upon discharge  in facilitating mobility and self-care at the household level. 5. Physiatrist will provide 24 hour management of medical needs as well as oversight of the therapy plan/treatment and provide guidance as appropriate regarding the interaction of the two. 6. The Preadmission Screening has been reviewed and patient status is unchanged unless otherwise stated above. 7. 24 hour rehab nursing will assist with bladder management, bowel management, safety, skin/wound care, disease management, medication administration, pain management and patient education  and help integrate therapy concepts, techniques,education, etc. 8. PT will assess and treat for/with: Lower extremity strength, range of motion, stamina, balance, functional mobility, safety, adaptive techniques and equipment, pain mgt, community reintegration, family ed.   Goals are: min to mod assist at w/c level. 9. OT will assess and treat for/with: ADL's, functional mobility, safety, upper extremity strength, adaptive techniques and equipment, wound care, ego support, family education.   Goals are: min to mod assist at w/c level. Therapy may not yet proceed with showering this patient. 10. SLP will assess and treat for/with: communication and swallowing, cognition.  Goals are:  mod I. 11. Case Management and  Social Worker will assess and treat for psychological issues and discharge planning. 12. Team conference will be held weekly to assess progress toward goals and to determine barriers to discharge. 13. Patient will receive at least 3 hours of therapy per day at least 5 days per week. 14. ELOS: 10-15 days       15. Prognosis:  excellent     Meredith Staggers, MD, [place] [facility] 02/05/2017  Meredith Staggers, MD 02/05/2017

## 2017-02-06 ENCOUNTER — Inpatient Hospital Stay (HOSPITAL_COMMUNITY): Payer: Medicare Other | Admitting: Occupational Therapy

## 2017-02-06 ENCOUNTER — Inpatient Hospital Stay (HOSPITAL_COMMUNITY): Payer: Medicare Other | Admitting: Speech Pathology

## 2017-02-06 ENCOUNTER — Inpatient Hospital Stay (HOSPITAL_COMMUNITY): Payer: Medicare Other | Admitting: Physical Therapy

## 2017-02-06 DIAGNOSIS — I10 Essential (primary) hypertension: Secondary | ICD-10-CM

## 2017-02-06 DIAGNOSIS — E1165 Type 2 diabetes mellitus with hyperglycemia: Secondary | ICD-10-CM

## 2017-02-06 DIAGNOSIS — R5381 Other malaise: Principal | ICD-10-CM

## 2017-02-06 DIAGNOSIS — Z794 Long term (current) use of insulin: Secondary | ICD-10-CM

## 2017-02-06 DIAGNOSIS — N183 Chronic kidney disease, stage 3 (moderate): Secondary | ICD-10-CM

## 2017-02-06 DIAGNOSIS — E1122 Type 2 diabetes mellitus with diabetic chronic kidney disease: Secondary | ICD-10-CM

## 2017-02-06 LAB — GLUCOSE, CAPILLARY
GLUCOSE-CAPILLARY: 96 mg/dL (ref 65–99)
GLUCOSE-CAPILLARY: 107 mg/dL — AB (ref 65–99)
GLUCOSE-CAPILLARY: 107 mg/dL — AB (ref 65–99)
Glucose-Capillary: 114 mg/dL — ABNORMAL HIGH (ref 65–99)

## 2017-02-06 LAB — PROTIME-INR
INR: 2.03
Prothrombin Time: 23.2 seconds — ABNORMAL HIGH (ref 11.4–15.2)

## 2017-02-06 LAB — COMPREHENSIVE METABOLIC PANEL
ALBUMIN: 2 g/dL — AB (ref 3.5–5.0)
ALT: 47 U/L (ref 17–63)
AST: 49 U/L — AB (ref 15–41)
Alkaline Phosphatase: 145 U/L — ABNORMAL HIGH (ref 38–126)
Anion gap: 11 (ref 5–15)
BUN: 12 mg/dL (ref 6–20)
CHLORIDE: 99 mmol/L — AB (ref 101–111)
CO2: 27 mmol/L (ref 22–32)
Calcium: 8.1 mg/dL — ABNORMAL LOW (ref 8.9–10.3)
Creatinine, Ser: 0.66 mg/dL (ref 0.61–1.24)
GFR calc Af Amer: >60 mL/min (ref >60)
GFR calc non Af Amer: >60 mL/min (ref >60)
Glucose, Bld: 114 mg/dL — ABNORMAL HIGH (ref 65–99)
POTASSIUM: 2.7 mmol/L — AB (ref 3.5–5.1)
SODIUM: 137 mmol/L (ref 135–145)
Total Bilirubin: 0.8 mg/dL (ref 0.3–1.2)
Total Protein: 5.6 g/dL — ABNORMAL LOW (ref 6.5–8.1)

## 2017-02-06 LAB — AEROBIC/ANAEROBIC CULTURE W GRAM STAIN (SURGICAL/DEEP WOUND)

## 2017-02-06 MED ORDER — POTASSIUM CHLORIDE CRYS ER 10 MEQ PO TBCR
20.0000 meq | EXTENDED_RELEASE_TABLET | Freq: Three times a day (TID) | ORAL | Status: AC
  Administered 2017-02-06 (×3): 20 meq via ORAL
  Filled 2017-02-06 (×3): qty 2

## 2017-02-06 MED ORDER — POTASSIUM CHLORIDE CRYS ER 20 MEQ PO TBCR
20.0000 meq | EXTENDED_RELEASE_TABLET | Freq: Every day | ORAL | Status: DC
  Administered 2017-02-07 – 2017-02-18 (×12): 20 meq via ORAL
  Filled 2017-02-06 (×12): qty 1

## 2017-02-06 MED ORDER — POTASSIUM CHLORIDE CRYS ER 20 MEQ PO TBCR
20.0000 meq | EXTENDED_RELEASE_TABLET | Freq: Once | ORAL | Status: AC
  Administered 2017-02-06: 20 meq via ORAL
  Filled 2017-02-06: qty 1

## 2017-02-06 NOTE — Significant Event (Signed)
CRITICAL VALUE ALERT  Critical value received:  Potassium 2.7   Date of notification:  02/06/17  Time of notification:  0010  Critical value read back:Yes.    Nurse who received alert:  Simonne Come, LPN  MD notified (1st page):  Dr. Alain Marion    Time of first page:  0015  MD notified (2nd page):  Time of second page:  Responding MD:  Dr. Alain Marion  Time MD responded:  [phone]

## 2017-02-06 NOTE — Progress Notes (Signed)
Scott [name] is a 71 y.o. male May 23, 1946 [id number]  Subjective: No new complaints. No new problems. Slept well. Feeling OK.  Objective: Vital signs in last 24 hours: Temp:  [98.2 F (36.8 C)] 98.2 F (36.8 C) (03/16 1704) Pulse Rate:  [52-68] 68 (03/17 1500) Resp:  [18] 18 (03/17 1500) BP: (105-106)/(57-62) 105/57 (03/17 1500) SpO2:  [98 %-100 %] 98 % (03/17 1500) Weight:  [160 lb 11.2 oz (72.9 kg)] 160 lb 11.2 oz (72.9 kg) (03/16 1704) Weight change:  Last BM Date: 02/06/17  Intake/Output from previous day: 03/16 0701 - 03/17 0700 In: -  Out: 550 [Urine:550] Last cbgs: CBG (last 3)   Recent Labs  02/05/17 2031 02/06/17 0635 02/06/17 1122  GLUCAP 154* 96 107*     Physical Exam General: No apparent distress  - appears chronically ill HEENT: not dry Lungs: Normal effort. Lungs clear to auscultation, no crackles or wheezes. Cardiovascular: Regular rate and rhythm, no edema Abdomen: S/NT/ND; BS(+) Musculoskeletal:  unchanged Neurological: No new neurological deficits Wounds: clean on all 4 extr amputees Skin: clear  Aging changes Mental state: Alert, cooperative Scrotum w/?fistula    Lab Results: BMET    Component Value Date/Time   NA 137 02/05/2017 2328   K 2.7 (LL) 02/05/2017 2328   CL 99 (L) 02/05/2017 2328   CO2 27 02/05/2017 2328   GLUCOSE 114 (H) 02/05/2017 2328   BUN 12 02/05/2017 2328   CREATININE 0.66 02/05/2017 2328   CREATININE 0.87 10/19/2016 1123   CALCIUM 8.1 (L) 02/05/2017 2328   GFRNONAA >60 02/05/2017 2328   GFRAA >60 02/05/2017 2328   CBC    Component Value Date/Time   WBC 10.9 (H) 02/05/2017 2328   RBC 3.42 (L) 02/05/2017 2328   HGB 8.4 (L) 02/05/2017 2328   HCT 28.5 (L) 02/05/2017 2328   PLT 357 02/05/2017 2328   MCV 83.3 02/05/2017 2328   MCH 24.6 (L) 02/05/2017 2328   MCHC 29.5 (L) 02/05/2017 2328   RDW 18.5 (H) 02/05/2017 2328   LYMPHSABS 1.5 02/05/2017 2328   MONOABS 0.8 02/05/2017 2328   EOSABS 0.3 02/05/2017  2328   BASOSABS 0.0 02/05/2017 2328    Studies/Results: No results found.  Medications: I have reviewed the patient's current medications.  Assessment/Plan:  1. Deconditioning --- CIR 2. Ischemic injury to 4 limbs - s/p amputation --- CIR 3. DVT proph - Coumadin 4. A fib -- Coumadin, Atenolol, digoxin, Amiodarone 5. Anemia - monitor CBC 6. DM2 - Lantus and SS 7. HTN - Lasix, Digoxin 8. Diarrhea - Lomotil prn 9. Penile prosthesis Pseudomonas infection - on Cipro; chronic wound 10. CAD - no CP. Cont w/Rx   Length of stay, days: 1  Walker Kehr , MD 02/06/2017, 3:51 PM

## 2017-02-06 NOTE — Plan of Care (Signed)
Problem: RH Tub/Shower Transfers Goal: LTG Patient will perform tub/shower transfers w/assist (OT) LTG: Patient will perform tub/shower transfers with assist, with/without cues using equipment (OT)  Outcome: Not Applicable Date Met: 49/17/91 Due to pts medical status/inaccessible bathroom environment in home

## 2017-02-06 NOTE — Progress Notes (Signed)
Penrose drain found unattached in patient's bed by nurse tech. Minimal drainage noted. Patient denies pain/discomfort at this time. Provider on call made aware. No acute distress noted.

## 2017-02-06 NOTE — Evaluation (Signed)
Speech Language Pathology Assessment and Plan  Patient Details  Name: Scott [name] MRN: [id number] Date of Birth: 1946-01-16  SLP Diagnosis: Voice disorder;Cognitive Impairments;Dysphagia  Rehab Potential: Excellent ELOS: 21-24 days     Today's Date: 02/06/2017 SLP Individual Time: 5427-0623 SLP Individual Time Calculation (min): 60 min   Problem List:  Patient Active Problem List   Diagnosis Date Noted  . Debility 02/05/2017  . Ganglion upper arm, right   . Thrombosis of both upper extremities   . Suspected heparin induced thrombocytopenia (HIT) in hospitalized patient ([place])   . Gangrene of lower extremity ([place])   . Heparin induced thrombocytopenia (HIT) ([place])   . Tracheostomy status ([place])   . Chest tube in place   . Atherosclerosis of native arteries of extremities with gangrene, left leg ([place])   . Atherosclerosis of native arteries of extremities with gangrene, right leg ([place])   . Acute on chronic diastolic CHF (congestive heart failure) ([place])   . Enteritis due to Clostridium difficile   . Anasarca   . FUO (fever of unknown origin)   . Acute encephalopathy   . Elevated LFTs   . DIC (disseminated intravascular coagulation) ([place]) 10/28/2016  . Postoperative atrial fibrillation ([place]) 10/24/2016  . Cardiogenic shock ([place])   . Mitral regurgitation due to cusp prolapse 10/22/2016  . S/P aortic valve replacement with bioprosthetic valve 10/22/2016  . S/P ascending aortic aneurysm repair 10/22/2016  . S/P mitral valve repair 10/22/2016  . Hx of CABG 10/22/2016  . Thrombocytopenia ([place]) 10/22/2016  . Acute combined systolic and diastolic congestive heart failure ([place]) 10/22/2016  . Acute respiratory failure ([place]) 10/22/2016  . Thoracic ascending aortic aneurysm ([place]) 10/21/2016  . Coronary artery disease involving native coronary artery of native heart with unstable angina pectoris ([place]) 10/20/2016  . Severe aortic stenosis by prior echocardiography 10/19/2016  . Unstable  angina ([place]) 10/19/2016  . Aortic valve stenosis 10/19/2016  . Bicuspid aortic valve 10/19/2016  . Trigger ring finger of right hand   . Wears glasses   . Gout   . Hypertension   . Carpal tunnel syndrome of right wrist   . Arthritis   . Snores    Past Medical History:  Past Medical History:  Diagnosis Date  . Arthritis    "hips, shoulders; knees; back" (10/20/2016)  . Bicuspid aortic valve   . BPH (benign prostatic hypertrophy)   . Carpal tunnel syndrome of right wrist   . Coronary artery disease involving native coronary artery of native heart with unstable angina pectoris ([place]) 10/20/2016  . Diverticulitis   . GERD (gastroesophageal reflux disease)   . Gout   . Heart murmur   . Hyperlipemia   . Hypertension   . Postoperative atrial fibrillation ([place]) 10/24/2016  . RLS (restless legs syndrome)   . S/P aortic valve replacement with bioprosthetic valve 10/22/2016   25 mm [facility] Ease bovine pericardial bioprosthetic tissue valve  . S/P ascending aortic aneurysm repair 10/22/2016   28 mm supracoronary straight graft replacement of ascending thoracic aortic aneurysm  . S/P CABG x 3 10/22/2016   Sequential LIMA to Diag and LAD, SVG to distal LAD, open vein harvest right thigh  . S/P mitral valve repair 10/22/2016   Artificial Gore-tex neochord placement x6 - posterior annuloplasty band placed but removed due to systolic anterior motion of mitral valve  . Severe aortic stenosis   . Snores    Never been tested for sleep apnea  . Thoracic ascending aortic aneurysm ([place]) 10/21/2016  .  Thrombocytopenia ([place]) 10/22/2016  . Type II diabetes mellitus ([place])    Past Surgical History:  Past Surgical History:  Procedure Laterality Date  . AMPUTATION Bilateral 12/11/2016   Procedure: AMPUTATION BELOW KNEE BILATERALLY;  Surgeon: [name], MD;  Location: [place];  Service: Orthopedics;  Laterality: Bilateral;  . AMPUTATION Bilateral 12/11/2016   Procedure: AMPUTATION BILATERAL  HANDS EXCEPT RIGHT THUMB;  Surgeon: [name], MD;  Location: [place];  Service: Orthopedics;  Laterality: Bilateral;  . AORTIC VALVE REPLACEMENT N/A 10/22/2016   Procedure: AORTIC VALVE REPLACEMENT (AVR) WITH SIZE 25 MM MAGNA EASE PERICARDIAL BIOPROSTHESIS - AORTIC;  Surgeon: Rexene Alberts, MD;  Location: [place];  Service: Open Heart Surgery;  Laterality: N/A;  . BONE EXCISION Right 02/01/2017   Procedure: EXCISION RIGHT INDEX METACARPAL HEAD;  Surgeon: [name], MD;  Location: [place];  Service: Orthopedics;  Laterality: Right;  . CARDIAC CATHETERIZATION N/A 10/20/2016   Procedure: Right/Left Heart Cath and Coronary Angiography;  Surgeon: Leonie Man, MD;  Location: [place] [facility];  Service: Cardiovascular;  Laterality: N/A;  . CARPAL TUNNEL RELEASE Right 11/28/2013   Procedure: RIGHT WRIST CARPAL TUNNEL RELEASE;  Surgeon: Lorn Junes, MD;  Location: [place];  Service: Orthopedics;  Laterality: Right;  . CATARACT EXTRACTION W/ INTRAOCULAR LENS  IMPLANT, BILATERAL Bilateral 1978  . COLONOSCOPY    . CORONARY ARTERY BYPASS GRAFT N/A 10/22/2016   Procedure: CORONARY ARTERY BYPASS GRAFTING (CABG)x 2 WITH LIMA TO DIAGONAL, OPEN  HARVESTING OF RIGHT SAPHENOUS VEIN FOR VEIN GRAFT TO LAD;  Surgeon: Rexene Alberts, MD;  Location: [place];  Service: Open Heart Surgery;  Laterality: N/A;  . FRACTURE SURGERY    . INGUINAL HERNIA REPAIR Right 1998  . IR GENERIC HISTORICAL  12/07/2016   IR US GUIDE VASC ACCESS RIGHT 12/07/2016 Aletta Edouard, MD [facility]  . IR GENERIC HISTORICAL  12/07/2016   IR RADIOLOGY PERIPHERAL GUIDED IV START 12/07/2016 Aletta Edouard, MD [facility]  . IR GENERIC HISTORICAL  12/07/2016   IR GASTROSTOMY TUBE MOD SED 12/07/2016 Aletta Edouard, MD [facility]  . LIPOMA EXCISION Right 2008   "side of my head"  . MITRAL VALVE REPAIR N/A 10/22/2016   Procedure: MITRAL VALVE REPAIR (MVR) WITH SIZE 30 SORIN ANNULOFLEX ANNULOPLASTY RING WITH SUBSEQUENT  REMOVAL OF RING;  Surgeon: Rexene Alberts, MD;  Location: [place];  Service: Open Heart Surgery;  Laterality: N/A;  . PENECTOMY  2007   Peyronie's disease   . PENILE PROSTHESIS IMPLANT  2009  . REMOVAL OF PENILE PROSTHESIS N/A 02/01/2017   Procedure: REMOVAL OF PENILE PROSTHESIS;  Surgeon: Kathie Rhodes, MD;  Location: [place];  Service: Urology;  Laterality: N/A;  . SHOULDER OPEN ROTATOR CUFF REPAIR Right 2006  . STERNAL CLOSURE N/A 10/26/2016   Procedure: STERNAL WASHOUT AND DELAYED PRIMARY CLOSURE;  Surgeon: Rexene Alberts, MD;  Location: [place];  Service: Thoracic;  Laterality: N/A;  . TEE WITHOUT CARDIOVERSION N/A 10/26/2016   Procedure: TRANSESOPHAGEAL ECHOCARDIOGRAM (TEE);  Surgeon: Rexene Alberts, MD;  Location: [place];  Service: Thoracic;  Laterality: N/A;  . TEE WITHOUT CARDIOVERSION N/A 10/22/2016   Procedure: TRANSESOPHAGEAL ECHOCARDIOGRAM (TEE);  Surgeon: Rexene Alberts, MD;  Location: [place];  Service: Open Heart Surgery;  Laterality: N/A;  . THORACIC AORTIC ANEURYSM REPAIR  10/22/2016   Procedure: ASCENDING AORTIC  ANEURYSM REPAIR (AAA) WITH 28 MM HEMASHIELD PLATINUM WOVEN DOUBLE VELOUR VASCULAR GRAFT;  Surgeon: Rexene Alberts,  MD;  Location: [place];  Service: Open Heart Surgery;;  . TONSILLECTOMY  ~ 1955  . TRACHEOSTOMY TUBE PLACEMENT N/A 11/09/2016   Procedure: TRACHEOSTOMY;  Surgeon: Rexene Alberts, MD;  Location: [place];  Service: Thoracic;  Laterality: N/A;  . TRANSURETHRAL RESECTION OF PROSTATE  2005  . TRIGGER FINGER RELEASE Bilateral    several lt and rt hands  . TRIGGER FINGER RELEASE Right 11/28/2013   Procedure: RIGHT TRIGGER FINGER  RELEASE (TENDON SHEATH INCISION);  Surgeon: Lorn Junes, MD;  Location: [place];  Service: Orthopedics;  Laterality: Right;  Marland Kitchen VIDEO BRONCHOSCOPY N/A 11/09/2016   Procedure: VIDEO BRONCHOSCOPY;  Surgeon: Rexene Alberts, MD;  [facility];  Service: Thoracic;  Laterality: N/A;  . WRIST FRACTURE SURGERY Right ~ 1959     Assessment / Plan / Recommendation Clinical Impression Patient is a 71 year old male with history of T2DM, heart murmur, BPH, RLS, peyronie's disease, bicuspid aortic valve with severe stenosi who was originally admitted to [facility] on today 10/20/16 for cardiac cath to work up 2 month history of chest pain with SOB. He was found to have 3 vessel CAD with borderline critical AS and thoracic ascending aneurysm.   He was taken to OR on 10/22/16 for CABG X 3, AVR, MVR, repair of thoracic ascending aneurysm with placement of VAC on open chest. Post op course significant for cardiogenic shock requiring multiple pressors, SAM requiring removal of mitral annuloplasty ring,  persistent fevers, encephalopathy, shocked liver, DIC due to consumptive intraoperative coagulopathy, diffuse clotting with ischemia of distal BUE/BLE extremities, A fib, C diff colitis, volume overload and VDRF with difficulty with vent wean requiring tracheostomy.  Sternal wound closed on 12/4 and was started on coumadin for RV thrombus and A fib.  He was [facility] to [facility] on 12/26 for vent wean.  Hospital course significant for MRSA bacteremia, pneumonitis, intermittent fevers, dysphagia requiring PEG tube placement on 1/15 by Dr. Kathlene Cote. He developed progressive gangrenous changes of BUE and BLE requiring B-transtibial amputation , left transmetacarpal amputation and Right MCP amputation of right hand by Dr. Sharol Given on 1/19.  Has been showing improvement in respiratory status and endurance. He tolerated extubation to ATC and was de-cannulated on 3/12. Tube feeds have been discontinued and diet was been gradually advanced to regular textures. He developed phimosis with erosion of penile prothesis and purulent drainage and despite antibiotics. Scrotal wound positive for few pseudomonas aeruginosa and moderate bacteroides fragilis therefore antibiotics changed to Zosyn X 1 week. Dr. Karsten Ro consulted for input  and recommended removal of prosthesis.  He has had poor healing of right index MCP. Once INR normalized, he underwent removal of penile prosthesis by Dr. Karsten Ro and amputation of index MC.   Patient transferred to CIR on 02/05/2017.   Patient demonstrates mild cognitive impairments impacting short-term recall of functional information. Patient also demonstrates decreased speech intelligibility due to a breathy vocal quality from the open stoma in which patient requires Min A verbal cues to apply pressure to during verbal expression to improve vocal intensity and overall quality/intelligibility. Patient consumed regular textures and demonstrated efficient mastication and complete oral clearance without overt s/s of aspiration. Patient noted to demonstrate throat clearing with large sips of thin liquids via straw which was eliminated with small, single sips. Recommend patient continue current diet of regular textures with thin liquids and intermittent supervision. Patient would benefit from skilled SLP intervention to maximize his speech intelligibility, cognitive and swallowing function and overall functional  independence prior to discharge.    Skilled Therapeutic Interventions          Administered a cognitive-linguistic evaluation and BSE. Please see above for details.   SLP Assessment  Patient will need skilled Speech Lanaguage Pathology Services during CIR admission    Recommendations  SLP Diet Recommendations: Age appropriate regular solids;Thin Liquid Administration via: Cup;Straw Medication Administration: Whole meds with liquid (1-2 at a time ) Supervision: Patient able to self feed;Intermittent supervision to cue for compensatory strategies (Needs set-up) Compensations: Slow rate;Small sips/bites Postural Changes and/or Swallow Maneuvers: Seated upright 90 degrees Oral Care Recommendations: Oral care BID Recommendations for Other Services: Neuropsych consult Patient destination: Home Follow up Recommendations:  (TBD) Equipment  Recommended: None recommended by SLP    SLP Frequency 3 to 5 out of 7 days   SLP Duration  SLP Intensity  SLP Treatment/Interventions 21-24 days   Minumum of 1-2 x/day, 30 to 90 minutes  Cognitive remediation/compensation;Cueing hierarchy;Functional tasks;Patient/family education;Environmental controls;Therapeutic Activities;Speech/Language facilitation;Internal/external aids;Dysphagia/aspiration precaution training    Pain Pain Assessment Pain Assessment: No/denies pain Pain Score: 0-No pain  Prior Functioning Type of Home: House  Lives With: Spouse Available Help at Discharge: Family;Available 24 hours/day (wife, two daughters and son very committed)  Function:  Eating Eating   Modified Consistency Diet: Yes Eating Assist Level: Assistive Device;More than reasonable amount of time;Set up assist for;Supervision or verbal cues   Eating Set Up Assist For: Applying device (includes dentures)       Cognition Comprehension Comprehension assist level: Understands complex 90% of the time/cues 10% of the time  Expression   Expression assist level: Expresses complex 90% of the time/cues < 10% of the time  Social Interaction Social Interaction assist level: Interacts appropriately with others - No medications needed.  Problem Solving Problem solving assist level: Solves basic 90% of the time/requires cueing < 10% of the time  Memory Memory assist level: Recognizes or recalls 75 - 89% of the time/requires cueing 10 - 24% of the time   Short Term Goals: Week 1: SLP Short Term Goal 1 (Week 1): Patient will consume current diet without overt s/s of aspiration and supervision verbal cues for use of swallowing compensotry strategies.  SLP Short Term Goal 2 (Week 1): Patient will apply pressure to stoma during verbal expression to maximize vocal quality and overall speech intelligibility at the conversation level to 100% with supervision verbal cues.  SLP Short Term Goal 3 (Week 1):  Patient will recall new, daily information with utilization of memory compensatory strategies and external aids with Min A verbal and visual cues.   Refer to Care Plan for Long Term Goals  Recommendations for other services: Neuropsych  Discharge Criteria: Patient will be discharged from SLP if patient refuses treatment 3 consecutive times without medical reason, if treatment goals not met, if there is a change in medical status, if patient makes no progress towards goals or if patient is discharged from hospital.  The above assessment, treatment plan, treatment alternatives and goals were discussed and mutually agreed upon: by patient and by family  Jeffory Snelgrove 02/06/2017, 3:43 PM

## 2017-02-06 NOTE — Evaluation (Signed)
Physical Therapy Assessment and Plan  Patient Details  Name: Scott [name] MRN: [id number] Date of Birth: 03-Nov-1946  PT Diagnosis: Abnormal posture, Difficulty walking and Osteoarthritis Rehab Potential: Good ELOS: [id number]    Today's Date: 02/06/2017 PT Individual Time: 1517-6160 PT Individual Time Calculation (min): 70 min    Problem List:  Patient Active Problem List   Diagnosis Date Noted  . Debility 02/05/2017  . Ganglion upper arm, right   . Thrombosis of both upper extremities   . Suspected heparin induced thrombocytopenia (HIT) in hospitalized patient ([place])   . Gangrene of lower extremity ([place])   . Heparin induced thrombocytopenia (HIT) ([place])   . Tracheostomy status ([place])   . Chest tube in place   . Atherosclerosis of native arteries of extremities with gangrene, left leg ([place])   . Atherosclerosis of native arteries of extremities with gangrene, right leg ([place])   . Acute on chronic diastolic CHF (congestive heart failure) ([place])   . Enteritis due to Clostridium difficile   . Anasarca   . FUO (fever of unknown origin)   . Acute encephalopathy   . Elevated LFTs   . DIC (disseminated intravascular coagulation) ([place]) 10/28/2016  . Postoperative atrial fibrillation ([place]) 10/24/2016  . Cardiogenic shock ([place])   . Mitral regurgitation due to cusp prolapse 10/22/2016  . S/P aortic valve replacement with bioprosthetic valve 10/22/2016  . S/P ascending aortic aneurysm repair 10/22/2016  . S/P mitral valve repair 10/22/2016  . Hx of CABG 10/22/2016  . Thrombocytopenia ([place]) 10/22/2016  . Acute combined systolic and diastolic congestive heart failure ([place]) 10/22/2016  . Acute respiratory failure ([place]) 10/22/2016  . Thoracic ascending aortic aneurysm ([place]) 10/21/2016  . Coronary artery disease involving native coronary artery of native heart with unstable angina pectoris ([place]) 10/20/2016  . Severe aortic stenosis by prior echocardiography 10/19/2016  . Unstable angina  ([place]) 10/19/2016  . Aortic valve stenosis 10/19/2016  . Bicuspid aortic valve 10/19/2016  . Trigger ring finger of right hand   . Wears glasses   . Gout   . Hypertension   . Carpal tunnel syndrome of right wrist   . Arthritis   . Snores     Past Medical History:  Past Medical History:  Diagnosis Date  . Arthritis    "hips, shoulders; knees; back" (10/20/2016)  . Bicuspid aortic valve   . BPH (benign prostatic hypertrophy)   . Carpal tunnel syndrome of right wrist   . Coronary artery disease involving native coronary artery of native heart with unstable angina pectoris ([place]) 10/20/2016  . Diverticulitis   . GERD (gastroesophageal reflux disease)   . Gout   . Heart murmur   . Hyperlipemia   . Hypertension   . Postoperative atrial fibrillation ([place]) 10/24/2016  . RLS (restless legs syndrome)   . S/P aortic valve replacement with bioprosthetic valve 10/22/2016   25 mm [facility] Ease bovine pericardial bioprosthetic tissue valve  . S/P ascending aortic aneurysm repair 10/22/2016   28 mm supracoronary straight graft replacement of ascending thoracic aortic aneurysm  . S/P CABG x 3 10/22/2016   Sequential LIMA to Diag and LAD, SVG to distal LAD, open vein harvest right thigh  . S/P mitral valve repair 10/22/2016   Artificial Gore-tex neochord placement x6 - posterior annuloplasty band placed but removed due to systolic anterior motion of mitral valve  . Severe aortic stenosis   . Snores    Never been tested for sleep apnea  . Thoracic ascending aortic aneurysm ([place])  10/21/2016  . Thrombocytopenia ([place]) 10/22/2016  . Type II diabetes mellitus ([place])    Past Surgical History:  Past Surgical History:  Procedure Laterality Date  . AMPUTATION Bilateral 12/11/2016   Procedure: AMPUTATION BELOW KNEE BILATERALLY;  Surgeon: [name], MD;  Location: [place];  Service: Orthopedics;  Laterality: Bilateral;  . AMPUTATION Bilateral 12/11/2016   Procedure: AMPUTATION BILATERAL HANDS  EXCEPT RIGHT THUMB;  Surgeon: [name], MD;  Location: [place];  Service: Orthopedics;  Laterality: Bilateral;  . AORTIC VALVE REPLACEMENT N/A 10/22/2016   Procedure: AORTIC VALVE REPLACEMENT (AVR) WITH SIZE 25 MM MAGNA EASE PERICARDIAL BIOPROSTHESIS - AORTIC;  Surgeon: Rexene Alberts, MD;  Location: [place];  Service: Open Heart Surgery;  Laterality: N/A;  . BONE EXCISION Right 02/01/2017   Procedure: EXCISION RIGHT INDEX METACARPAL HEAD;  Surgeon: [name], MD;  Location: [place];  Service: Orthopedics;  Laterality: Right;  . CARDIAC CATHETERIZATION N/A 10/20/2016   Procedure: Right/Left Heart Cath and Coronary Angiography;  Surgeon: Leonie Man, MD;  Location: [place] [facility];  Service: Cardiovascular;  Laterality: N/A;  . CARPAL TUNNEL RELEASE Right 11/28/2013   Procedure: RIGHT WRIST CARPAL TUNNEL RELEASE;  Surgeon: Lorn Junes, MD;  Location: [place];  Service: Orthopedics;  Laterality: Right;  . CATARACT EXTRACTION W/ INTRAOCULAR LENS  IMPLANT, BILATERAL Bilateral 1978  . COLONOSCOPY    . CORONARY ARTERY BYPASS GRAFT N/A 10/22/2016   Procedure: CORONARY ARTERY BYPASS GRAFTING (CABG)x 2 WITH LIMA TO DIAGONAL, OPEN  HARVESTING OF RIGHT SAPHENOUS VEIN FOR VEIN GRAFT TO LAD;  Surgeon: Rexene Alberts, MD;  Location: [place];  Service: Open Heart Surgery;  Laterality: N/A;  . FRACTURE SURGERY    . INGUINAL HERNIA REPAIR Right 1998  . IR GENERIC HISTORICAL  12/07/2016   IR US GUIDE VASC ACCESS RIGHT 12/07/2016 Aletta Edouard, MD [facility]  . IR GENERIC HISTORICAL  12/07/2016   IR RADIOLOGY PERIPHERAL GUIDED IV START 12/07/2016 Aletta Edouard, MD [facility]  . IR GENERIC HISTORICAL  12/07/2016   IR GASTROSTOMY TUBE MOD SED 12/07/2016 Aletta Edouard, MD [facility]  . LIPOMA EXCISION Right 2008   "side of my head"  . MITRAL VALVE REPAIR N/A 10/22/2016   Procedure: MITRAL VALVE REPAIR (MVR) WITH SIZE 30 SORIN ANNULOFLEX ANNULOPLASTY RING WITH SUBSEQUENT REMOVAL  OF RING;  Surgeon: Rexene Alberts, MD;  Location: [place];  Service: Open Heart Surgery;  Laterality: N/A;  . PENECTOMY  2007   Peyronie's disease   . PENILE PROSTHESIS IMPLANT  2009  . REMOVAL OF PENILE PROSTHESIS N/A 02/01/2017   Procedure: REMOVAL OF PENILE PROSTHESIS;  Surgeon: Kathie Rhodes, MD;  Location: [place];  Service: Urology;  Laterality: N/A;  . SHOULDER OPEN ROTATOR CUFF REPAIR Right 2006  . STERNAL CLOSURE N/A 10/26/2016   Procedure: STERNAL WASHOUT AND DELAYED PRIMARY CLOSURE;  Surgeon: Rexene Alberts, MD;  Location: [place];  Service: Thoracic;  Laterality: N/A;  . TEE WITHOUT CARDIOVERSION N/A 10/26/2016   Procedure: TRANSESOPHAGEAL ECHOCARDIOGRAM (TEE);  Surgeon: Rexene Alberts, MD;  Location: [place];  Service: Thoracic;  Laterality: N/A;  . TEE WITHOUT CARDIOVERSION N/A 10/22/2016   Procedure: TRANSESOPHAGEAL ECHOCARDIOGRAM (TEE);  Surgeon: Rexene Alberts, MD;  Location: [place];  Service: Open Heart Surgery;  Laterality: N/A;  . THORACIC AORTIC ANEURYSM REPAIR  10/22/2016   Procedure: ASCENDING AORTIC  ANEURYSM REPAIR (AAA) WITH 28 MM HEMASHIELD PLATINUM WOVEN DOUBLE VELOUR VASCULAR GRAFT;  Surgeon:  Rexene Alberts, MD;  Location: [place];  Service: Open Heart Surgery;;  . TONSILLECTOMY  ~ 1955  . TRACHEOSTOMY TUBE PLACEMENT N/A 11/09/2016   Procedure: TRACHEOSTOMY;  Surgeon: Rexene Alberts, MD;  Location: [place];  Service: Thoracic;  Laterality: N/A;  . TRANSURETHRAL RESECTION OF PROSTATE  2005  . TRIGGER FINGER RELEASE Bilateral    several lt and rt hands  . TRIGGER FINGER RELEASE Right 11/28/2013   Procedure: RIGHT TRIGGER FINGER  RELEASE (TENDON SHEATH INCISION);  Surgeon: Lorn Junes, MD;  Location: [place];  Service: Orthopedics;  Laterality: Right;  Marland Kitchen VIDEO BRONCHOSCOPY N/A 11/09/2016   Procedure: VIDEO BRONCHOSCOPY;  Surgeon: Rexene Alberts, MD;  Location: [facility];  Service: Thoracic;  Laterality: N/A;  . WRIST FRACTURE SURGERY Right ~ 1959     Assessment & Plan Clinical Impression: Patient is a 71 year old male with history of T2DM, heart murmur, BPH, RLS, peyronie's disease, bicuspid aortic valve with severe stenosi who was originally admitted to [facility] on today 10/20/16 for cardiac cath to work up 2 month history of chest pain with SOB. He was found to have 3 vessel CAD with borderline critical AS and thoracic ascending aneurysm.   He was taken to OR on 10/22/16 for CABG X 3, AVR, MVR, repair of thoracic ascending aneurysm with placement of VAC on open chest. Post op course significant for cardiogenic shock requiring multiple pressors, SAM requiring removal of mitral annuloplasty ring,  persistent fevers, encephalopathy, shocked liver, DIC due to consumptive intraoperative coagulopathy, diffuse clotting with ischemia of distal BUE/BLE extremities, A fib, C diff colitis, volume overload and VDRF with difficulty with vent wean requiring tracheostomy.  Sternal wound closed on 12/4 and was started on coumadin for RV thrombus and A fib.    He was transferred to [facility] on 12/26 for vent wean.  Hospital course significant for MRSA bacteremia, pneumonitis, intermittent fevers, dysphagia requiring PEG tube placement on 1/15 by Dr. Kathlene Cote. He developed progressive gangrenous changes of BUE and BLE requiring B-transtibial amputation , left transmetacarpal amputation and Right MCP amputation of right hand by Dr. Sharol Given on 1/19.  Has been showing improvement in respiratory status and endurance. He tolerated extubation to ATC and was de-cannulated on 3/12. Tube feeds have been discontinued and diet was been gradually advanced to regular with chopped meats by 3/14.  He developed phimosis with erosion of penile prothesis and purulent drainage and despite antibiotics. Scrotal wound positive for few pseudomonas aeruginosa and moderate bacteroides fragilis therefore antibiotics changed to Zosyn X 1 week. Dr. Karsten Ro consulted for input  and recommended removal of  prosthesis. He has had poor healing of right index MCP. Once INR normalized, he underwent removal of penile prosthesis by Dr. Karsten Ro and amputation of index MC.   Patient transferred to CIR on 02/05/2017 .   Patient currently requires total with mobility secondary to muscle weakness, decreased cardiorespiratoy endurance and decreased sitting balance and decreased balance strategies.  Prior to hospitalization, patient was independent  with mobility and lived with   in a   home.  Home access is   .  Patient will benefit from skilled PT intervention to maximize safe functional mobility, minimize fall risk and decrease caregiver burden for planned discharge home with 24 hour assist.  Anticipate patient will benefit from follow up [facility] at discharge.  PT - End of Session Activity Tolerance: Tolerates 10 - 20 min activity with multiple rests Endurance Deficit: Yes PT Assessment Rehab Potential (  ACUTE/IP ONLY): Good Barriers to Discharge: Inaccessible home environment PT Patient demonstrates impairments in the following area(s): Balance;Endurance;Motor;Pain;Safety;Skin Integrity;Sensory PT Transfers Functional Problem(s): Bed Mobility;Bed to Chair;Car;Furniture;Floor;Other (comment) PT Locomotion Functional Problem(s): Wheelchair Mobility;Other (comment) PT Plan PT Intensity: Minimum of 1-2 x/day ,45 to 90 minutes PT Frequency: 5 out of 7 days PT Duration Estimated Length of Stay: 21-24days  PT Treatment/Interventions: Balance/vestibular training;Cognitive remediation/compensation;Community reintegration;Discharge planning;Disease management/prevention;DME/adaptive equipment instruction;Functional mobility training;Neuromuscular re-education;Pain management;Patient/family education;Psychosocial support;Skin care/wound management;Stair training;Therapeutic Activities;Therapeutic Exercise;UE/LE Strength taining/ROM;UE/LE Coordination activities;Wheelchair propulsion/positioning PT Transfers Anticipated  Outcome(s): Mod Assist with LRAD  PT Locomotion Anticipated Outcome(s): min assist in [place] [facility] PT Recommendation Recommendations for Other Services: Neuropsych consult Follow Up Recommendations: Home health PT Patient destination: Home Equipment Recommended: Wheelchair (measurements);Wheelchair cushion (measurements);To be determined  Skilled Therapeutic Intervention PT instructed patient in PT Evaluation and initiated treatment intervention; see below for results. PT educated patient in [place], rehab potential, rehab goals, and discharge recommendations. PT instructed patient in bed mobility, and transfre training with [employer]. Total assist transfer with max cues to reduce use of R UE due to recent surgical revision. Pt demonstrated difficultly limiting WB through RUE with sitting balance and transfer. PT fit pt for TIS WC with pressure relieving fushion cushion for pressure relief and improve sitting tolerance. Patient returned to room and left sitting in [facility] with call bell in reach and all needs met.      PT Evaluation General   Vital Signs  Pain  0/10  Home Living/Prior Functioning Home Living Available Help at Discharge: Family;Available 24 hours/day (wife, two daughters and son very committed) Type of Home: House Home Access: Stairs to enter [employer] of Steps: 2 steps to porch then one step in (wife met with contractor for ramp on 02/03/2017) Entrance Stairs-Rails: None Home Layout: Two level;1/2 bath on main level (will set up bedroom downstairs in dining room) Bathroom Shower/Tub: Tub/shower unit (upstairs) Bathroom Toilet: Associate Professor Accessibility: Yes  Lives With: Spouse Prior Function Level of Independence: Independent with basic ADLs  Able to Take Stairs?: Yes Comments: hospitalized since 09/2016 admission Reports that family will start to build ramp within next 2 weeks Vision/Perception  Perception Comments: WFL   Cognition Overall Cognitive Status:  Impaired/Different from baseline Arousal/Alertness: Awake/alert Orientation Level: Oriented X4 Attention: Sustained;Selective Sustained Attention: Appears intact Selective Attention: Appears intact Memory: Impaired Memory Impairment: Decreased short term memory Decreased Short Term Memory: Functional basic;Verbal basic Awareness: Appears intact Problem Solving: Appears intact Safety/Judgment: Appears intact Sensation Sensation Light Touch: Appears Intact Proprioception: Appears Intact Coordination Gross Motor Movements are Fluid and Coordinated: Yes Fine Motor Movements are Fluid and Coordinated: No Motor  Motor Motor: Other (comment) Motor - Skilled Clinical Observations: Generalized weakness  Mobility Bed Mobility Bed Mobility: Rolling Right;Rolling Left;Sit to Supine;Supine to Sit Rolling Right: 3: Mod assist Rolling Right Details: Verbal cues for precautions/safety;Verbal cues for safe use of DME/AE;Tactile cues for placement Rolling Left: 3: Mod assist Rolling Left Details: Verbal cues for precautions/safety;Verbal cues for gait pattern;Verbal cues for safe use of DME/AE Supine to Sit: 2: Max assist Supine to Sit Details: Verbal cues for sequencing;Verbal cues for precautions/safety;Tactile cues for placement Sit to Supine: 3: Mod assist;2: Max assist Sit to Supine - Details: Verbal cues for precautions/safety;Verbal cues for gait pattern;Tactile cues for placement Transfers Transfers: Yes Lateral/Scoot Transfers: 2: Max assist;1: +1 Total assist Lateral/Scoot Transfer Details: Manual facilitation for placement;Verbal cues for precautions/safety;Verbal cues for safe use of DME/AE Locomotion  Ambulation Ambulation: No Gait Gait: No Stairs /  Additional Locomotion Stairs: No Architect: Yes Wheelchair Assistance: 1: +1 Total assist (TIS WC) Wheelchair Propulsion: Other (comment) Wheelchair Parts Management: Needs assistance   Trunk/Postural Assessment  Cervical Assessment Cervical Assessment: Exceptions to [facility] (forward head) Thoracic Assessment Thoracic Assessment: Exceptions to [facility] (posterior lean) Lumbar Assessment Lumbar Assessment: Exceptions to [facility] (posterior pelvic tilt) Postural Control Postural Control: Deficits on evaluation  Balance Static Sitting Balance Static Sitting - Level of Assistance: 5: Stand by assistance (with slight UE support ) Dynamic Sitting Balance Dynamic Sitting - Level of Assistance: 4: Min assist Extremity Assessment      RLE Assessment RLE Assessment: Exceptions to [facility] RLE AROM (degrees) RLE Overall AROM Comments: WFL for motions assessed, hip flexion, knee flexion/extension, able to achieve at least neutral hip extension  RLE Strength RLE Overall Strength Comments: 4/5 at hip and knee except hip flexion 4-/5  LLE Assessment LLE Assessment: Exceptions to WFL LLE AROM (degrees) LLE Overall AROM Comments: WFL for motions assessed, hip flexion, knee flexion/extension, able to achieve at least neutral hip extension  LLE Strength LLE Overall Strength Comments: 4/5 in hip and knee for all motions proximal to distal    See Function Navigator for Current Functional Status.   Refer to Care Plan for Long Term Goals  Recommendations for other services: Neuropsych  Discharge Criteria: Patient will be discharged from PT if patient refuses treatment 3 consecutive times without medical reason, if treatment goals not met, if there is a change in medical status, if patient makes no progress towards goals or if patient is discharged from hospital.  The above assessment, treatment plan, treatment alternatives and goals were discussed and mutually agreed upon: by patient  Lorie Phenix 02/06/2017, 2:33 PM

## 2017-02-06 NOTE — Evaluation (Signed)
Occupational Therapy Assessment and Plan  Patient Details  Name: Scott [name] MRN: [id number] Date of Birth: 1945/12/19  OT Diagnosis: abnormal posture, cognitive deficits and muscle weakness (generalized) Rehab Potential: Rehab Potential (ACUTE ONLY): Good ELOS: 3-3.5 weeks   Today's Date: 02/06/2017 OT Individual Time:  - 1478-2956 Individual Treatment Time Calculation: 72 minutes       Problem List:  Patient Active Problem List   Diagnosis Date Noted  . Debility 02/05/2017  . Ganglion upper arm, right   . Thrombosis of both upper extremities   . Suspected heparin induced thrombocytopenia (HIT) in hospitalized patient ([place])   . Gangrene of lower extremity ([place])   . Heparin induced thrombocytopenia (HIT) ([place])   . Tracheostomy status ([place])   . Chest tube in place   . Atherosclerosis of native arteries of extremities with gangrene, left leg ([place])   . Atherosclerosis of native arteries of extremities with gangrene, right leg ([place])   . Acute on chronic diastolic CHF (congestive heart failure) ([place])   . Enteritis due to Clostridium difficile   . Anasarca   . FUO (fever of unknown origin)   . Acute encephalopathy   . Elevated LFTs   . DIC (disseminated intravascular coagulation) ([place]) 10/28/2016  . Postoperative atrial fibrillation ([place]) 10/24/2016  . Cardiogenic shock ([place])   . Mitral regurgitation due to cusp prolapse 10/22/2016  . S/P aortic valve replacement with bioprosthetic valve 10/22/2016  . S/P ascending aortic aneurysm repair 10/22/2016  . S/P mitral valve repair 10/22/2016  . Hx of CABG 10/22/2016  . Thrombocytopenia ([place]) 10/22/2016  . Acute combined systolic and diastolic congestive heart failure ([place]) 10/22/2016  . Acute respiratory failure ([place]) 10/22/2016  . Thoracic ascending aortic aneurysm ([place]) 10/21/2016  . Coronary artery disease involving native coronary artery of native heart with unstable angina pectoris ([place]) 10/20/2016  . Severe aortic stenosis  by prior echocardiography 10/19/2016  . Unstable angina ([place]) 10/19/2016  . Aortic valve stenosis 10/19/2016  . Bicuspid aortic valve 10/19/2016  . Trigger ring finger of right hand   . Wears glasses   . Gout   . Hypertension   . Carpal tunnel syndrome of right wrist   . Arthritis   . Snores     Past Medical History:  Past Medical History:  Diagnosis Date  . Arthritis    "hips, shoulders; knees; back" (10/20/2016)  . Bicuspid aortic valve   . BPH (benign prostatic hypertrophy)   . Carpal tunnel syndrome of right wrist   . Coronary artery disease involving native coronary artery of native heart with unstable angina pectoris ([place]) 10/20/2016  . Diverticulitis   . GERD (gastroesophageal reflux disease)   . Gout   . Heart murmur   . Hyperlipemia   . Hypertension   . Postoperative atrial fibrillation ([place]) 10/24/2016  . RLS (restless legs syndrome)   . S/P aortic valve replacement with bioprosthetic valve 10/22/2016   25 mm [facility] Ease bovine pericardial bioprosthetic tissue valve  . S/P ascending aortic aneurysm repair 10/22/2016   28 mm supracoronary straight graft replacement of ascending thoracic aortic aneurysm  . S/P CABG x 3 10/22/2016   Sequential LIMA to Diag and LAD, SVG to distal LAD, open vein harvest right thigh  . S/P mitral valve repair 10/22/2016   Artificial Gore-tex neochord placement x6 - posterior annuloplasty band placed but removed due to systolic anterior motion of mitral valve  . Severe aortic stenosis   . Snores    Never been tested  for sleep apnea  . Thoracic ascending aortic aneurysm ([place]) 10/21/2016  . Thrombocytopenia ([place]) 10/22/2016  . Type II diabetes mellitus ([place])    Past Surgical History:  Past Surgical History:  Procedure Laterality Date  . AMPUTATION Bilateral 12/11/2016   Procedure: AMPUTATION BELOW KNEE BILATERALLY;  Surgeon: [name], MD;  Location: [facility];  Service: Orthopedics;  Laterality: Bilateral;  . AMPUTATION  Bilateral 12/11/2016   Procedure: AMPUTATION BILATERAL HANDS EXCEPT RIGHT THUMB;  Surgeon: [name], MD;  Location: [facility];  Service: Orthopedics;  Laterality: Bilateral;  . AORTIC VALVE REPLACEMENT N/A 10/22/2016   Procedure: AORTIC VALVE REPLACEMENT (AVR) WITH SIZE 25 MM MAGNA EASE PERICARDIAL BIOPROSTHESIS - AORTIC;  Surgeon: Rexene Alberts, MD;  Location: [facility];  Service: Open Heart Surgery;  Laterality: N/A;  . BONE EXCISION Right 02/01/2017   Procedure: EXCISION RIGHT INDEX METACARPAL HEAD;  Surgeon: [name], MD;  Location: [facility];  Service: Orthopedics;  Laterality: Right;  . CARDIAC CATHETERIZATION N/A 10/20/2016   Procedure: Right/Left Heart Cath and Coronary Angiography;  Surgeon: Leonie Man, MD;  Location: [facility];  Service: Cardiovascular;  Laterality: N/A;  . CARPAL TUNNEL RELEASE Right 11/28/2013   Procedure: RIGHT WRIST CARPAL TUNNEL RELEASE;  Surgeon: Lorn Junes, MD;  Location: [facility];  Service: Orthopedics;  Laterality: Right;  . CATARACT EXTRACTION W/ INTRAOCULAR LENS  IMPLANT, BILATERAL Bilateral 1978  . COLONOSCOPY    . CORONARY ARTERY BYPASS GRAFT N/A 10/22/2016   Procedure: CORONARY ARTERY BYPASS GRAFTING (CABG)x 2 WITH LIMA TO DIAGONAL, OPEN  HARVESTING OF RIGHT SAPHENOUS VEIN FOR VEIN GRAFT TO LAD;  Surgeon: Rexene Alberts, MD;  Location: [facility];  Service: Open Heart Surgery;  Laterality: N/A;  . FRACTURE SURGERY    . INGUINAL HERNIA REPAIR Right 1998  . IR GENERIC HISTORICAL  12/07/2016   IR US GUIDE VASC ACCESS RIGHT 12/07/2016 Aletta Edouard, MD [facility]  . IR GENERIC HISTORICAL  12/07/2016   IR RADIOLOGY PERIPHERAL GUIDED IV START 12/07/2016 Aletta Edouard, MD [facility]  . IR GENERIC HISTORICAL  12/07/2016   IR GASTROSTOMY TUBE MOD SED 12/07/2016 Aletta Edouard, MD [facility]  . LIPOMA EXCISION Right 2008   "side of my head"  . MITRAL VALVE REPAIR N/A 10/22/2016   Procedure: MITRAL VALVE REPAIR (MVR) WITH SIZE 30  SORIN ANNULOFLEX ANNULOPLASTY RING WITH SUBSEQUENT REMOVAL OF RING;  Surgeon: Rexene Alberts, MD;  Location: [place];  Service: Open Heart Surgery;  Laterality: N/A;  . PENECTOMY  2007   Peyronie's disease   . PENILE PROSTHESIS IMPLANT  2009  . REMOVAL OF PENILE PROSTHESIS N/A 02/01/2017   Procedure: REMOVAL OF PENILE PROSTHESIS;  Surgeon: Kathie Rhodes, MD;  Location: [facility];  Service: Urology;  Laterality: N/A;  . SHOULDER OPEN ROTATOR CUFF REPAIR Right 2006  . STERNAL CLOSURE N/A 10/26/2016   Procedure: STERNAL WASHOUT AND DELAYED PRIMARY CLOSURE;  Surgeon: Rexene Alberts, MD;  Location: [facility];  Service: Thoracic;  Laterality: N/A;  . TEE WITHOUT CARDIOVERSION N/A 10/26/2016   Procedure: TRANSESOPHAGEAL ECHOCARDIOGRAM (TEE);  Surgeon: Rexene Alberts, MD;  Location: [place];  Service: Thoracic;  Laterality: N/A;  . TEE WITHOUT CARDIOVERSION N/A 10/22/2016   Procedure: TRANSESOPHAGEAL ECHOCARDIOGRAM (TEE);  Surgeon: Rexene Alberts, MD;  Location: [facility];  Service: Open Heart Surgery;  Laterality: N/A;  . THORACIC AORTIC ANEURYSM REPAIR  10/22/2016   Procedure: ASCENDING AORTIC  ANEURYSM REPAIR (AAA) WITH 28  MM HEMASHIELD PLATINUM WOVEN DOUBLE VELOUR VASCULAR GRAFT;  Surgeon: Rexene Alberts, MD;  Location: [facility];  Service: Open Heart Surgery;;  . TONSILLECTOMY  ~ 1955  . TRACHEOSTOMY TUBE PLACEMENT N/A 11/09/2016   Procedure: TRACHEOSTOMY;  Surgeon: Rexene Alberts, MD;  Location: [facility];  Service: Thoracic;  Laterality: N/A;  . TRANSURETHRAL RESECTION OF PROSTATE  2005  . TRIGGER FINGER RELEASE Bilateral    several lt and rt hands  . TRIGGER FINGER RELEASE Right 11/28/2013   Procedure: RIGHT TRIGGER FINGER  RELEASE (TENDON SHEATH INCISION);  Surgeon: Lorn Junes, MD;  Location: [facility];  Service: Orthopedics;  Laterality: Right;  Marland Kitchen VIDEO BRONCHOSCOPY N/A 11/09/2016   Procedure: VIDEO BRONCHOSCOPY;  Surgeon: Rexene Alberts, MD;  Location: [facility];  Service: Thoracic;  Laterality:  N/A;  . WRIST FRACTURE SURGERY Right ~ 1959    Assessment & Plan Clinical Impression:  KERIM STATZER a 71 year old male with history of T2DM, heart murmur, BPH, RLS, peyronie's disease, bicuspid aortic valve with severe stenosi who was originally admitted to [facility] on today 10/20/16 for cardiac cath to work up 2 month history of chest pain with SOB. He was found to have 3 vessel CAD with borderline critical AS and thoracic ascending aneurysm.   He was taken to OR on 10/22/16 for CABG X 3, AVR, MVR, repair of thoracic ascending aneurysm with placement of VAC on open chest. Post op course significant for cardiogenic shock requiring multiple pressors, SAM requiring removal of mitral annuloplasty ring,  persistent fevers, encephalopathy, shocked liver, DIC due to consumptive intraoperative coagulopathy, diffuse clotting with ischemia of distal BUE/BLE extremities, A fib, C diff colitis, volume overload and VDRF with difficulty with vent wean requiring tracheostomy.  Sternal wound closed on 12/4 and was started on coumadin for RV thrombus and A fib.    He was transferred to [facility] on 12/26 for vent wean.  Hospital course significant for MRSA bacteremia, pneumonitis, intermittent fevers, dysphagia requiring PEG tube placement on 1/15 by Dr. Kathlene Cote. He developed progressive gangrenous changes of BUE and BLE requiring B-transtibial amputation , left transmetacarpal amputation and Right MCP amputation of right hand by Dr. Sharol Given on 1/19.  Has been showing improvement in respiratory status and endurance. He tolerated extubation to ATC and was de-cannulated on 3/12. Tube feeds have been discontinued and diet was been gradually advanced to regular with chopped meats by 3/14.  He developed phimosis with erosion of penile prothesis and purulent drainage and despite antibiotics. Scrotal wound positive for few pseudomonas aeruginosa and moderate bacteroides fragilis therefore antibiotics changed to Zosyn X 1 week. Dr.  Karsten Ro consulted for input  and recommended removal of prosthesis. He has had poor healing of right index MCP. Once INR normalized, he underwent removal of penile prosthesis by Dr. Karsten Ro and amputation of index MC.   Patient currently requires max with basic self-care skills secondary to muscle weakness, decreased cardiorespiratoy endurance and decreased sitting balance, decreased postural control and decreased balance strategies.  Prior to hospitalization, patient could complete BADLs with supervision.  Patient will benefit from skilled intervention to increase independence with basic self-care skills prior to discharge home with care partner.  Anticipate patient will require moderate physical assestance and follow up home health.  OT - End of Session Endurance Deficit: Yes OT Assessment Rehab Potential (ACUTE ONLY): Good Barriers to Discharge: Inaccessible home environment OT Patient demonstrates impairments in the following area(s): Balance;Safety;Cognition;Endurance;Motor OT Basic ADL's Functional Problem(s): Eating;Grooming;Bathing;Dressing;Toileting OT  Transfers Functional Problem(s): Toilet;Tub/Shower OT Additional Impairment(s): Fuctional Use of Upper Extremity OT Plan OT Intensity: Minimum of 1-2 x/day, 45 to 90 minutes OT Frequency: 5 out of 7 days OT Duration/Estimated Length of Stay: 3-3.5 weeks OT Treatment/Interventions: Discharge planning;Self Care/advanced ADL retraining;Therapeutic Activities;Cognitive remediation/compensation;Functional mobility training;Patient/family education;Therapeutic Exercise;DME/adaptive equipment instruction;Psychosocial support;UE/LE Strength taining/ROM OT Self Feeding Anticipated Outcome(s): Supervision OT Basic Self-Care Anticipated Outcome(s): Min-Mod A OT Toileting Anticipated Outcome(s): Mod A OT Bathroom Transfers Anticipated Outcome(s): Mod A OT Recommendation Recommendations for Other Services: Neuropsych consult Patient destination:  Home Follow Up Recommendations: Home health OT Equipment Recommended: To be determined   Skilled Therapeutic Intervention Skilled OT session completed with focus on initial evaluation, education on OT role/POC, and establishment of patient-centered goals. Pt had breakfast tray at time of arrival and requested to eat prior to ADLs. Pt donned wrist cuff with Min A, required assist for placement of utensil inside of cuff, opening containers, and managing beverages due to bilateral MCP amputations. Afterwards he completed supine<sit with Mod A for trunk facilitation. Bathing/dressing completed at EOB with Min A for maintaining dynamic balance, cues to address posterior lean. He completed ADLs with overall Max A, utilizing lateral leans for pericare and lifting pants over hips. RN notified regarding blood residue on penis/buttocks. At end of session pt was returned to bed and left with all needs within reach.   OT Evaluation Precautions/Restrictions  Precautions Precautions: Fall;Other (comment) Precaution Comments: Left UE transmetacarpal amputation, R UE MCP amputation, bilateral BKA, Gtube General Chart Reviewed: Yes Family/Caregiver Present: No Vital Signs  Pain No c/o pain during session    Home Living/Prior [place] expects to be discharged to:: Private residence Living Arrangements: Spouse/significant other Available Help at Discharge: Family, Available 24 hours/day Type of Home: House Home Access: Stairs to enter [employer] of Steps:  (planning to build ramp this month) Entrance Stairs-Rails: None Home Layout: Two level, 1/2 bath on main level (Pt reports that he plans to reside on main level and he and spouse are making changes for this to be feasible) Bathroom Shower/Tub: Tub/shower unit (on second level, 14 stairs to ascend) [employer]: Standard Bathroom Accessibility: Yes  Lives With: Spouse IADL History Homemaking  Responsibilities: Yes Meal Prep Responsibility: Secondary Laundry Responsibility: Secondary Occupation: Retired Type of Occupation: Retired, worked at [employer] in [place] and [place]: Spending time with family Prior Function Comments: hospitalized since 09/2016 admission Reports that family will start to build ramp within next 2 weeks ADL ADL ADL Comments: Please see functional navigator for ADL status Vision/Perception  Vision- History Baseline Vision/History: No visual deficits;Wears glasses Wears Glasses: At all times Patient Visual Report: No change from baseline Vision- Assessment Vision Assessment?: No apparent visual deficits Praxis Praxis-Other Comments: WFL for bathing/dressing completion  Cognition Overall Cognitive Status: Impaired/Different from baseline Arousal/Alertness: Awake/alert Orientation Level: Person;Place;Situation Person: Oriented Place: Oriented Situation: Oriented Year: 2018 Month: March Day of Week: Incorrect Memory: Impaired Memory Impairment: Decreased recall of new information Decreased Short Term Memory: Functional basic;Verbal basic Immediate Memory Recall: Sock;Blue;Bed Memory Recall: Bed Memory Recall Bed: With Cue Attention: Sustained Sustained Attention: Appears intact Selective Attention: Appears intact Awareness: Appears intact Problem Solving: Appears intact Safety/Judgment: Appears intact Sensation Sensation Light Touch: Appears Intact Stereognosis: Not tested Hot/Cold: Appears Intact Proprioception: Appears Intact Coordination Gross Motor Movements are Fluid and Coordinated: Yes Fine Motor Movements are Fluid and Coordinated: No (due to amputation of all digits except for right thumb) Finger Nose Finger Test: Not assessed due  to pt lacking all digits besides right thumb Motor  Motor Motor: Abnormal postural alignment and control Motor - Skilled Clinical Observations: global weakness and postural deficits   Mobility     Trunk/Postural Assessment  Cervical Assessment Cervical Assessment: Exceptions to [facility] (forward head) Thoracic Assessment Thoracic Assessment: Exceptions to [facility] (posterior lean) Lumbar Assessment Lumbar Assessment: Exceptions to [facility] (posterior pelvic tilt) Postural Control Postural Control: Deficits on evaluation  Balance Balance Balance Assessed: Yes Dynamic Sitting Balance Dynamic Sitting - Level of Assistance: 4: Min assist Extremity/Trunk Assessment RUE Assessment RUE Assessment: Exceptions to [facility] (3/5 proximal to distal) LUE Assessment LUE Assessment: Exceptions to [facility] (3/5 proximal to distal)   See Function Navigator for Current Functional Status.   Refer to Care Plan for Long Term Goals  Recommendations for other services: Neuropsych   Discharge Criteria: Patient will be discharged from OT if patient refuses treatment 3 consecutive times without medical reason, if treatment goals not met, if there is a change in medical status, if patient makes no progress towards goals or if patient is discharged from hospital.  The above assessment, treatment plan, treatment alternatives and goals were discussed and mutually agreed upon: by patient  Skeet Simmer 02/06/2017, 7:23 PM

## 2017-02-06 NOTE — Progress Notes (Signed)
ANTICOAGULATION CONSULT NOTE - Follow Up Consult  Pharmacy Consult for Lovenox / Coumadin Indication: Afib / RV thrombus  Patient Measurements: Weight: 160 lb 11.2 oz (72.9 kg)  Labs:  Recent Labs  02/03/17 1044 02/04/17 0623 02/05/17 0835 02/05/17 2328  HGB  --   --   --  8.4*  HCT  --   --   --  28.5*  PLT  --   --   --  357  LABPROT 18.3* 17.7* 18.8*  --   INR 1.50 1.44 1.56  --   CREATININE  --   --   --  0.66    Estimated Creatinine Clearance: 85.9 mL/min (by C-G formula based on SCr of 0.66 mg/dL).   Assessment: 71yo M on warfarin for h/o afib/ RV thrombus was held for surgery, now restarting with Lovenox bridge until INR therapeutic x2. Today's INR is therapeutic at 2.03  Goal of Therapy:  INR 2-3 Monitor platelets by anticoagulation protocol: Yes   Plan:  Lovenox 70 mg sq Q 12 hours Continue warfarin 5 mg po daily for now Daily INR   Gwenlyn Perking, PharmD PGY1 Pharmacy Resident Pager: [phone] 02/06/2017 8:03 AM

## 2017-02-07 ENCOUNTER — Inpatient Hospital Stay (HOSPITAL_COMMUNITY): Payer: Medicare Other | Admitting: Occupational Therapy

## 2017-02-07 DIAGNOSIS — I739 Peripheral vascular disease, unspecified: Secondary | ICD-10-CM

## 2017-02-07 LAB — BASIC METABOLIC PANEL
ANION GAP: 9 (ref 5–15)
BUN: 12 mg/dL (ref 6–20)
CHLORIDE: 104 mmol/L (ref 101–111)
CO2: 25 mmol/L (ref 22–32)
Calcium: 8.7 mg/dL — ABNORMAL LOW (ref 8.9–10.3)
Creatinine, Ser: 0.76 mg/dL (ref 0.61–1.24)
GFR calc non Af Amer: >60 mL/min (ref >60)
Glucose, Bld: 96 mg/dL (ref 65–99)
POTASSIUM: 3.5 mmol/L (ref 3.5–5.1)
Sodium: 138 mmol/L (ref 135–145)

## 2017-02-07 LAB — GLUCOSE, CAPILLARY
GLUCOSE-CAPILLARY: 107 mg/dL — AB (ref 65–99)
GLUCOSE-CAPILLARY: 106 mg/dL — AB (ref 65–99)
GLUCOSE-CAPILLARY: 112 mg/dL — AB (ref 65–99)
Glucose-Capillary: 159 mg/dL — ABNORMAL HIGH (ref 65–99)

## 2017-02-07 LAB — PROTIME-INR
INR: 2.13
Prothrombin Time: 24.2 seconds — ABNORMAL HIGH (ref 11.4–15.2)

## 2017-02-07 NOTE — Consult Note (Signed)
[place] Nurse wound consult note Reason for Consult:Medical Device Related pressure injury to bilateral stump sites, lower extremities, from prostheses.  Scabbed vascular graft site to right inner thigh, 50% scabbed, 50% pink and moist Nonblanchable erythema to sacrum and apex gluteal cleft from moisture and pressure. Bilateral amputation hands except right thumb.  Scabbed incision line to both upper extremities.  Wound type:Nonhealing surgical wound to bilateral hands' amputation sites, right thigh vascular graft site Unstageable medical device related pressure injuries to bilateral BKA Pressure Injury POA: Yes Measurement:Right BKA suture line:  0.2 cm x 11 cm scabbed suture line with nonintact lateral end measuring 2 cm x 1 cm wound bed is 100% slough Left BKA suture line 0.2 cm x 9 cm scabbed suture line with nonintact medial end measuring 2 cm x 2 cm 100% slough wound bed.  Wound bed:see above Drainage (amount, consistency, odor) Moderate serosanguinous drainage to BLE suture lines and right medial thigh.  Otherwise dry.  Periwound: intact Dressing procedure/placement/frequency:Cleanse suture line to bilateral lower legs with NS.  Apply Xeroform gauze. (LAwson # [id number]).  Cover with prosthesis stocking garment. Change daily.  Cleanse right medial thigh graft site with NS and pat gently dry.  Apply Xeroform  to wound bed for atraumatic dressing removal.  Wrap with kerlix and tape.  Change daily.  Keep buttocks clean and dry.  Apply barrier cream daily and PRN incontinence.  Encourage to turn and reposition every two hours.  Will not follow at this time.  Please re-consult if needed.  Domenic Moras RN BSN [place] Pager [phone]

## 2017-02-07 NOTE — Progress Notes (Signed)
ANTICOAGULATION CONSULT NOTE - Follow Up Consult  Pharmacy Consult for Lovenox / warfarin Indication: Afib / RV thrombus  Patient Measurements: Weight: 160 lb 11.2 oz (72.9 kg)  Labs:  Recent Labs  02/05/17 0835 02/05/17 2328 02/06/17 0942 02/07/17 0729  HGB  --  8.4*  --   --   HCT  --  28.5*  --   --   PLT  --  357  --   --   LABPROT 18.8*  --  23.2* 24.2*  INR 1.56  --  2.03 2.13  CREATININE  --  0.66  --   --     Estimated Creatinine Clearance: 85.9 mL/min (by C-G formula based on SCr of 0.66 mg/dL).   Assessment: 71yo M with h/o afib/ RV thrombus on warfarin with Lovenox bridge until INR therapeutic x2. Today's INR is therapeutic again at 2.13. No CBC today, no bleeding noted.  Goal of Therapy:  INR 2-3 Monitor platelets by anticoagulation protocol: Yes   Plan:  Discontinue Lovenox Continue warfarin 5 mg po daily for now Daily INR Monitor CBC, s/sx of bleeding   Gwenlyn Perking, PharmD PGY1 Pharmacy Resident Pager: [phone] 02/07/2017 8:36 AM

## 2017-02-07 NOTE — Progress Notes (Signed)
Occupational Therapy Session Note  Patient Details  Name: Scott [name] MRN: [id number] Date of Birth: [birth year]  Today's Date: 02/07/2017 OT Individual Time: 0175-1025 OT Individual Time Calculation (min): 45 min    Short Term Goals: Week 1:  OT Short Term Goal 1 (Week 1): Pt will complete bathing with Min A and use of bath mits OT Short Term Goal 2 (Week 1): Pt will complete toilet transfer with LRAD and Max A OT Short Term Goal 3 (Week 1): Pt will maintain dynamic sitting balance at EOB with supervision during ADLs OT Short Term Goal 4 (Week 1): Pt will complete self feeding with AE and setup  Skilled Therapeutic Interventions/Progress Updates: Pt was lying in bed with nurse tech at time of arrival, requesting bed pan. Due to urgency, pt declined attempting to transfer to drop arm commode. Rolling R<L for hygiene completed with Mod A post voiding. Afterwards pt completed supine<sit with Mod A, instructed on using elbow for assist with trunk elevation. Once at EOB, pt donned mesh underwear, pt able to assist with 50% of lifting fabric over hips by looping wrists in leg holes. Practiced beezy board transfers to drop arm commode, attempted with 1 person but required 2 helpers for safety with board placement and gliding pt over board to commode. Used bed pad to reduce shearing. Pt using a great deal of core strength during session and fatigued quickly. He required rest after all transitional movements. At end of session pt was repositioned in bed with 2 helpers and left with nurse tech at time of departure.      Therapy Documentation Precautions:  Precautions Precautions: Fall, Other (comment) Precaution Comments: Left UE transmetacarpal amputation, R UE MCP amputation, bilateral BKA, G tube Restrictions Weight Bearing Restrictions: Yes General:   Vital Signs: Therapy Vitals Temp: 98.2 F (36.8 C) Temp Source: Oral Pulse Rate: (!) 109 Resp: 19 BP: (!) 108/57 Patient Position (if  appropriate): Lying Oxygen Therapy SpO2: 100 % O2 Device: Not Delivered   ADL: ADL ADL Comments: Please see functional navigator for ADL status    See Function Navigator for Current Functional Status.   Therapy/Group: Individual Therapy  Saphyre Cillo A Tyneka Scafidi 02/07/2017, 4:10 PM

## 2017-02-07 NOTE — Progress Notes (Signed)
Scott [name] is a 71 y.o. male 24-Feb-1946 [id number]  Subjective: No new complaints. No new problems.  Objective: Vital signs in last 24 hours: Temp:  [98.2 F (36.8 C)-98.8 F (37.1 C)] 98.2 F (36.8 C) (03/18 0438) Pulse Rate:  [63-69] 63 (03/18 0846) Resp:  [18] 18 (03/18 0438) BP: (93-105)/(57-73) 93/71 (03/18 0846) SpO2:  [98 %] 98 % (03/18 0438) Weight change:  Last BM Date: 02/06/17  Intake/Output from previous day: 03/17 0701 - 03/18 0700 In: 960 [P.O.:960] Out: 2300 [Urine:2300] Last cbgs: CBG (last 3)   Recent Labs  02/06/17 1722 02/06/17 2031 02/07/17 0639  GLUCAP 107* 114* 107*     Physical Exam General: No apparent distress - chronically ill appearing   HEENT: not dry Lungs: Normal effort. Lungs clear to auscultation, no crackles or wheezes. Cardiovascular: Regular rate and rhythm, no edema Abdomen: S/NT/ND; BS(+) Musculoskeletal:  unchanged Neurological: No new neurological deficits Wounds: extr x4 - clean; penile w/clear drainage  Skin:   Aging changes Mental state: Alert, oriented, cooperative    Lab Results: BMET    Component Value Date/Time   NA 138 02/07/2017 0729   K 3.5 02/07/2017 0729   CL 104 02/07/2017 0729   CO2 25 02/07/2017 0729   GLUCOSE 96 02/07/2017 0729   BUN 12 02/07/2017 0729   CREATININE 0.76 02/07/2017 0729   CREATININE 0.87 10/19/2016 1123   CALCIUM 8.7 (L) 02/07/2017 0729   GFRNONAA >60 02/07/2017 0729   GFRAA >60 02/07/2017 0729   CBC    Component Value Date/Time   WBC 10.9 (H) 02/05/2017 2328   RBC 3.42 (L) 02/05/2017 2328   HGB 8.4 (L) 02/05/2017 2328   HCT 28.5 (L) 02/05/2017 2328   PLT 357 02/05/2017 2328   MCV 83.3 02/05/2017 2328   MCH 24.6 (L) 02/05/2017 2328   MCHC 29.5 (L) 02/05/2017 2328   RDW 18.5 (H) 02/05/2017 2328   LYMPHSABS 1.5 02/05/2017 2328   MONOABS 0.8 02/05/2017 2328   EOSABS 0.3 02/05/2017 2328   BASOSABS 0.0 02/05/2017 2328    Studies/Results: No results  found.  Medications: I have reviewed the patient's current medications.  Assessment/Plan:  1. Deconditioning - CIR 2. 4 limbs ischemia - s/p amputation - wounds are healing 3. DVT proph w/Coumadin 4. A Fib -- Atenolol, Dig, Amiodarone and Coumadin  5. DM 2 - Lantus insulin 6. HTN - Lasix 7. Diarrhea - Lomotil prn 8. CAD. No angina. 9. Penile prosthesis infection - s/p removal. On Cipro Rx   Length of stay, days: 2  Walker Kehr , MD 02/07/2017, 11:13 AM

## 2017-02-08 ENCOUNTER — Inpatient Hospital Stay (HOSPITAL_COMMUNITY): Payer: Medicare Other | Admitting: Physical Therapy

## 2017-02-08 ENCOUNTER — Inpatient Hospital Stay (HOSPITAL_COMMUNITY): Payer: Medicare Other | Admitting: Speech Pathology

## 2017-02-08 ENCOUNTER — Inpatient Hospital Stay (HOSPITAL_COMMUNITY): Payer: Medicare Other | Admitting: Occupational Therapy

## 2017-02-08 DIAGNOSIS — E876 Hypokalemia: Secondary | ICD-10-CM

## 2017-02-08 LAB — GLUCOSE, CAPILLARY
GLUCOSE-CAPILLARY: 100 mg/dL — AB (ref 65–99)
GLUCOSE-CAPILLARY: 121 mg/dL — AB (ref 65–99)
Glucose-Capillary: 132 mg/dL — ABNORMAL HIGH (ref 65–99)

## 2017-02-08 LAB — PROTIME-INR
INR: 2.12
PROTHROMBIN TIME: 24.1 s — AB (ref 11.4–15.2)

## 2017-02-08 MED ORDER — MUPIROCIN 2 % EX OINT
1.0000 "application " | TOPICAL_OINTMENT | Freq: Two times a day (BID) | CUTANEOUS | Status: AC
  Administered 2017-02-08 – 2017-02-13 (×9): 1 via NASAL
  Filled 2017-02-08: qty 22

## 2017-02-08 MED ORDER — CHLORHEXIDINE GLUCONATE CLOTH 2 % EX PADS
6.0000 | MEDICATED_PAD | Freq: Every day | CUTANEOUS | Status: AC
  Administered 2017-02-10 – 2017-02-11 (×2): 6 via TOPICAL

## 2017-02-08 MED ORDER — COLLAGENASE 250 UNIT/GM EX OINT
TOPICAL_OINTMENT | Freq: Every day | CUTANEOUS | Status: DC
  Administered 2017-02-08: 10:00:00 via TOPICAL
  Filled 2017-02-08: qty 30

## 2017-02-08 MED ORDER — WHITE PETROLATUM GEL
Status: AC
  Filled 2017-02-08: qty 1

## 2017-02-08 MED ORDER — COLLAGENASE 250 UNIT/GM EX OINT
TOPICAL_OINTMENT | Freq: Every day | CUTANEOUS | Status: DC
  Administered 2017-02-08 – 2017-02-19 (×11): via TOPICAL
  Filled 2017-02-08 (×3): qty 30

## 2017-02-08 NOTE — IPOC Note (Signed)
Overall Plan of Care [facility]) Patient Details Name: FORTUNE BRANNIGAN MRN: [id number] DOB: 1946/02/20  Admitting Diagnosis: Debility  Hospital Problems: Active Problems:   Debility     Functional Problem List: Nursing Bladder, Bowel, Endurance, Motor, Medication Management, Skin Integrity, Sensory  PT Balance, Endurance, Motor, Pain, Safety, Skin Integrity, Sensory  OT Balance, Safety, Cognition, Endurance, Motor  SLP Cognition  TR         Basic ADL's: OT Eating, Grooming, Bathing, Dressing, Toileting     Advanced  ADL's: OT       Transfers: PT Bed Mobility, Bed to Chair, Car, Furniture, Floor, Other (comment)  OT Toilet, Tub/Shower     Locomotion: PT Wheelchair Mobility, Other (comment)     Additional Impairments: OT Fuctional Use of Upper Extremity  SLP Swallowing, Communication, Social Cognition expression Memory  TR      Anticipated Outcomes Item Anticipated Outcome  Self Feeding Supervision  Swallowing  Mod I   Basic self-care  Min-Mod A  Toileting  Mod A   Bathroom Transfers Mod A  Bowel/Bladder  min assist  Transfers  Mod Assist with LRAD   Locomotion  min assist in [place]  Communication  Mod I  Cognition  Mod I   Pain  < 3  Safety/Judgment  Min assist   Therapy Plan: PT Intensity: Minimum of 1-2 x/day ,45 to 90 minutes PT Frequency: 5 out of 7 days PT Duration Estimated Length of Stay: 21-24days  OT Intensity: Minimum of 1-2 x/day, 45 to 90 minutes OT Frequency: 5 out of 7 days OT Duration/Estimated Length of Stay: 3-3.5 weeks SLP Intensity: Minumum of 1-2 x/day, 30 to 90 minutes SLP Frequency: 3 to 5 out of 7 days SLP Duration/Estimated Length of Stay: 21-24 days        Team Interventions: Nursing Interventions Patient/Family Education, Bladder Management, Bowel Management, Medication Management, Psychosocial Support, Skin Care/Wound Management, Discharge Planning, Disease Management/Prevention  PT interventions Balance/vestibular  training, Cognitive remediation/compensation, Community reintegration, Discharge planning, Disease management/prevention, DME/adaptive equipment instruction, Functional mobility training, Neuromuscular re-education, Pain management, Patient/family education, Psychosocial support, Skin care/wound management, Stair training, Therapeutic Activities, Therapeutic Exercise, UE/LE Strength taining/ROM, UE/LE Coordination activities, Wheelchair propulsion/positioning  OT Interventions Discharge planning, Self Care/advanced ADL retraining, Therapeutic Activities, Cognitive remediation/compensation, Functional mobility training, Patient/family education, Therapeutic Exercise, DME/adaptive equipment instruction, Psychosocial support, UE/LE Strength taining/ROM  SLP Interventions Cognitive remediation/compensation, Cueing hierarchy, Functional tasks, Patient/family education, Environmental controls, Therapeutic Activities, Speech/Language facilitation, Internal/external aids, Dysphagia/aspiration precaution training  TR Interventions    SW/CM Interventions Discharge Planning, Psychosocial Support, Patient/Family Education    Team Discharge Planning: Destination: PT-Home ,OT- Home , SLP-Home Projected Follow-up: PT-Home health PT, OT-  Home health OT, SLP- (TBD) Projected Equipment Needs: PT-Wheelchair (measurements), Wheelchair cushion (measurements), To be determined, OT- To be determined, SLP-None recommended by SLP Equipment Details: PT- , OT-  Patient/family involved in discharge planning: PT- Patient, Family member/caregiver,  OT-Patient, SLP-Patient, Family member/caregiver  MD ELOS: 20-25 days Medical Rehab Prognosis:  Excellent Assessment: The patient has been admitted for CIR therapies with the diagnosis of debility, multiple amputations. The team will be addressing functional mobility, strength, stamina, balance, safety, adaptive techniques and equipment, self-care, bowel and bladder mgt, patient and  caregiver education, NMR, adaptive feeding devices, orthotic, pre-prosthetic education, ego support, wound care. Goals have been set at min to mod assist with basic mobility and w/c use as well as self-care. Mod I for cognition and communication.Meredith Staggers, MD, [place] Drown  See Team Conference Notes for weekly updates to the plan of care

## 2017-02-08 NOTE — Progress Notes (Signed)
Speech Language Pathology Daily Session Note  Patient Details  Name: Scott [name] MRN: [id number] Date of Birth: 12-28-1945  Today's Date: 02/08/2017 SLP Individual Time: 93-1605 SLP Individual Time Calculation (min): 30 min  Short Term Goals: Week 1: SLP Short Term Goal 1 (Week 1): Patient will consume current diet without overt s/s of aspiration and supervision verbal cues for use of swallowing compensotry strategies.  SLP Short Term Goal 2 (Week 1): Patient will apply pressure to stoma during verbal expression to maximize vocal quality and overall speech intelligibility at the conversation level to 100% with supervision verbal cues.  SLP Short Term Goal 3 (Week 1): Patient will recall new, daily information with utilization of memory compensatory strategies and external aids with Min A verbal and visual cues.   Skilled Therapeutic Interventions: Skilled treatment session focused on addressing cognition goals. SLP facilitated session by providing Min question cues to recall procedures for tipping rest break upon notification of timer.  SLP also facilitated session with Min question cues to recall how to improve self-reported weak vocal quality, with use of pressure for stoma closure.  Session ended patient patient returning demo of use of sodt call bell to request assist back to bed.  Continue with current plan of care.    Function:  Cognition Comprehension Comprehension assist level: Understands complex 90% of the time/cues 10% of the time  Expression   Expression assist level: Expresses complex 90% of the time/cues < 10% of the time  Social Interaction Social Interaction assist level: Interacts appropriately with others - No medications needed.  Problem Solving Problem solving assist level: Solves basic 90% of the time/requires cueing < 10% of the time  Memory Memory assist level: Recognizes or recalls 75 - 89% of the time/requires cueing 10 - 24% of the time    Pain Pain  Assessment Pain Assessment: No/denies pain  Therapy/Group: Individual Therapy  Carmelia Roller., [place] [phone]  [place] 02/08/2017, 5:06 PM

## 2017-02-08 NOTE — Progress Notes (Signed)
Penrose drain fell out this weekend.  Inguinal area macerated appearing and scrotum with single suture holding a safety pin. This was removed as penrose is out. Scrotum wound with depth 6 cm and tracts from 10 o'clock  to 2 o'clock. Moderate to large amount of serosanguinous drainage expressed on probing. No odor or purulence. GU contacted for follow up and input on dressing changes.

## 2017-02-08 NOTE — Progress Notes (Signed)
Social Work  Social Work Assessment and Plan  Patient Details  Name: Scott [name] MRN: [id number] Date of Birth: 01-24-1946  Today's Date: 02/08/2017  Problem List:  Patient Active Problem List   Diagnosis Date Noted  . Status post bilateral below knee amputation ([place]) 02/09/2017  . Wound disruption, post-op, skin, sequela 02/09/2017  . Debility 02/05/2017  . Ganglion upper arm, right   . Thrombosis of both upper extremities   . Suspected heparin induced thrombocytopenia (HIT) in hospitalized patient ([place])   . Gangrene of lower extremity ([place])   . Heparin induced thrombocytopenia (HIT) ([place])   . Tracheostomy status ([place])   . Chest tube in place   . Atherosclerosis of native arteries of extremities with gangrene, left leg ([place])   . Atherosclerosis of native arteries of extremities with gangrene, right leg ([place])   . Acute on chronic diastolic CHF (congestive heart failure) ([place])   . Enteritis due to Clostridium difficile   . Anasarca   . FUO (fever of unknown origin)   . Acute encephalopathy   . Elevated LFTs   . DIC (disseminated intravascular coagulation) ([place]) 10/28/2016  . Postoperative atrial fibrillation ([place]) 10/24/2016  . Cardiogenic shock ([place])   . Mitral regurgitation due to cusp prolapse 10/22/2016  . S/P aortic valve replacement with bioprosthetic valve 10/22/2016  . S/P ascending aortic aneurysm repair 10/22/2016  . S/P mitral valve repair 10/22/2016  . Hx of CABG 10/22/2016  . Thrombocytopenia ([place]) 10/22/2016  . Acute combined systolic and diastolic congestive heart failure ([place]) 10/22/2016  . Acute respiratory failure ([place]) 10/22/2016  . Thoracic ascending aortic aneurysm ([place]) 10/21/2016  . Coronary artery disease involving native coronary artery of native heart with unstable angina pectoris ([place]) 10/20/2016  . Severe aortic stenosis by prior echocardiography 10/19/2016  . Unstable angina ([place]) 10/19/2016  . Aortic valve stenosis 10/19/2016  . Bicuspid  aortic valve 10/19/2016  . Trigger ring finger of right hand   . Wears glasses   . Gout   . Hypertension   . Carpal tunnel syndrome of right wrist   . Arthritis   . Snores    Past Medical History:  Past Medical History:  Diagnosis Date  . Arthritis    "hips, shoulders; knees; back" (10/20/2016)  . Bicuspid aortic valve   . BPH (benign prostatic hypertrophy)   . Carpal tunnel syndrome of right wrist   . Coronary artery disease involving native coronary artery of native heart with unstable angina pectoris ([place]) 10/20/2016  . Diverticulitis   . GERD (gastroesophageal reflux disease)   . Gout   . Heart murmur   . Hyperlipemia   . Hypertension   . Postoperative atrial fibrillation ([place]) 10/24/2016  . RLS (restless legs syndrome)   . S/P aortic valve replacement with bioprosthetic valve 10/22/2016   25 mm [facility] Ease bovine pericardial bioprosthetic tissue valve  . S/P ascending aortic aneurysm repair 10/22/2016   28 mm supracoronary straight graft replacement of ascending thoracic aortic aneurysm  . S/P CABG x 3 10/22/2016   Sequential LIMA to Diag and LAD, SVG to distal LAD, open vein harvest right thigh  . S/P mitral valve repair 10/22/2016   Artificial Gore-tex neochord placement x6 - posterior annuloplasty band placed but removed due to systolic anterior motion of mitral valve  . Severe aortic stenosis   . Snores    Never been tested for sleep apnea  . Thoracic ascending aortic aneurysm ([place]) 10/21/2016  . Thrombocytopenia ([place]) 10/22/2016  . Type  II diabetes mellitus ([place])    Past Surgical History:  Past Surgical History:  Procedure Laterality Date  . AMPUTATION Bilateral 12/11/2016   Procedure: AMPUTATION BELOW KNEE BILATERALLY;  Surgeon: [name], MD;  Location: [place];  Service: Orthopedics;  Laterality: Bilateral;  . AMPUTATION Bilateral 12/11/2016   Procedure: AMPUTATION BILATERAL HANDS EXCEPT RIGHT THUMB;  Surgeon: [name], MD;  Location: [place];   Service: Orthopedics;  Laterality: Bilateral;  . AORTIC VALVE REPLACEMENT N/A 10/22/2016   Procedure: AORTIC VALVE REPLACEMENT (AVR) WITH SIZE 25 MM MAGNA EASE PERICARDIAL BIOPROSTHESIS - AORTIC;  Surgeon: Rexene Alberts, MD;  Location: [place];  Service: Open Heart Surgery;  Laterality: N/A;  . BONE EXCISION Right 02/01/2017   Procedure: EXCISION RIGHT INDEX METACARPAL HEAD;  Surgeon: [name], MD;  Location: [place];  Service: Orthopedics;  Laterality: Right;  . CARDIAC CATHETERIZATION N/A 10/20/2016   Procedure: Right/Left Heart Cath and Coronary Angiography;  Surgeon: Leonie Man, MD;  Location: [place] CV [facility];  Service: Cardiovascular;  Laterality: N/A;  . CARPAL TUNNEL RELEASE Right 11/28/2013   Procedure: RIGHT WRIST CARPAL TUNNEL RELEASE;  Surgeon: Lorn Junes, MD;  Location: [place];  Service: Orthopedics;  Laterality: Right;  . CATARACT EXTRACTION W/ INTRAOCULAR LENS  IMPLANT, BILATERAL Bilateral 1978  . COLONOSCOPY    . CORONARY ARTERY BYPASS GRAFT N/A 10/22/2016   Procedure: CORONARY ARTERY BYPASS GRAFTING (CABG)x 2 WITH LIMA TO DIAGONAL, OPEN  HARVESTING OF RIGHT SAPHENOUS VEIN FOR VEIN GRAFT TO LAD;  Surgeon: Rexene Alberts, MD;  Location: [place];  Service: Open Heart Surgery;  Laterality: N/A;  . FRACTURE SURGERY    . INGUINAL HERNIA REPAIR Right 1998  . IR GENERIC HISTORICAL  12/07/2016   IR US GUIDE VASC ACCESS RIGHT 12/07/2016 Aletta Edouard, MD [facility]  . IR GENERIC HISTORICAL  12/07/2016   IR RADIOLOGY PERIPHERAL GUIDED IV START 12/07/2016 Aletta Edouard, MD [facility]  . IR GENERIC HISTORICAL  12/07/2016   IR GASTROSTOMY TUBE MOD SED 12/07/2016 Aletta Edouard, MD [facility]  . LIPOMA EXCISION Right 2008   "side of my head"  . MITRAL VALVE REPAIR N/A 10/22/2016   Procedure: MITRAL VALVE REPAIR (MVR) WITH SIZE 30 SORIN ANNULOFLEX ANNULOPLASTY RING WITH SUBSEQUENT REMOVAL OF RING;  Surgeon: Rexene Alberts, MD;  Location: [place];  Service:  Open Heart Surgery;  Laterality: N/A;  . PENECTOMY  2007   Peyronie's disease   . PENILE PROSTHESIS IMPLANT  2009  . REMOVAL OF PENILE PROSTHESIS N/A 02/01/2017   Procedure: REMOVAL OF PENILE PROSTHESIS;  Surgeon: Kathie Rhodes, MD;  Location: [place];  Service: Urology;  Laterality: N/A;  . SHOULDER OPEN ROTATOR CUFF REPAIR Right 2006  . STERNAL CLOSURE N/A 10/26/2016   Procedure: STERNAL WASHOUT AND DELAYED PRIMARY CLOSURE;  Surgeon: Rexene Alberts, MD;  Location: [place];  Service: Thoracic;  Laterality: N/A;  . TEE WITHOUT CARDIOVERSION N/A 10/26/2016   Procedure: TRANSESOPHAGEAL ECHOCARDIOGRAM (TEE);  Surgeon: Rexene Alberts, MD;  Location: [place];  Service: Thoracic;  Laterality: N/A;  . TEE WITHOUT CARDIOVERSION N/A 10/22/2016   Procedure: TRANSESOPHAGEAL ECHOCARDIOGRAM (TEE);  Surgeon: Rexene Alberts, MD;  Location: [place];  Service: Open Heart Surgery;  Laterality: N/A;  . THORACIC AORTIC ANEURYSM REPAIR  10/22/2016   Procedure: ASCENDING AORTIC  ANEURYSM REPAIR (AAA) WITH 28 MM HEMASHIELD PLATINUM WOVEN DOUBLE VELOUR VASCULAR GRAFT;  Surgeon: Rexene Alberts, MD;  Location: [place];  Service: Open Heart Surgery;;  . TONSILLECTOMY  ~ 1955  . TRACHEOSTOMY TUBE PLACEMENT N/A 11/09/2016   Procedure: TRACHEOSTOMY;  Surgeon: Rexene Alberts, MD;  Location: [place];  Service: Thoracic;  Laterality: N/A;  . TRANSURETHRAL RESECTION OF PROSTATE  2005  . TRIGGER FINGER RELEASE Bilateral    several lt and rt hands  . TRIGGER FINGER RELEASE Right 11/28/2013   Procedure: RIGHT TRIGGER FINGER  RELEASE (TENDON SHEATH INCISION);  Surgeon: Lorn Junes, MD;  Location: [place];  Service: Orthopedics;  Laterality: Right;  Marland Kitchen VIDEO BRONCHOSCOPY N/A 11/09/2016   Procedure: VIDEO BRONCHOSCOPY;  Surgeon: Rexene Alberts, MD;  Location: [place];  Service: Thoracic;  Laterality: N/A;  . WRIST FRACTURE SURGERY Right ~ 1959   Social History:  reports that he quit smoking about 34 years ago. His smoking  use included Pipe and Cigars. He quit after 3.00 years of use. He has never used smokeless tobacco. He reports that he drinks about 6.0 oz of alcohol per week . He reports that he does not use drugs.  Family / Support Systems Marital Status: Married How Long?: 61 yrs (2nd marriage for both) Patient Roles: Spouse, Parent Spouse/Significant Other: wife, Joeangel Jeanpaul @ ([phone] Children: daughter, Golda Acre @ [phone];  daughter, Remus Loffler @ [phone] and son, Lorenda Cahill - all children living close by and very supportive. Other Supports: neighbors Anticipated Caregiver: wife, two daughters and son Ability/Limitations of Caregiver: no limitations for wife and daughters Caregiver Availability: 24/7 Family Dynamics: Wife and children all very involved and supportive.  Very encouraging to pt through this lengthy medical course.  Social History Preferred language: English Religion: Non-Denominational Cultural Background: NA Read: Yes Write: Yes Employment Status: Retired Date Retired/Disabled/Unemployed: 8 yrs Freight forwarder Issues: None Guardian/Conservator: None - per MD, pt capable of making decisions on his own behalf.   Abuse/Neglect Physical Abuse: Denies Verbal Abuse: Denies Sexual Abuse: Denies Exploitation of patient/patient's resources: Denies Self-Neglect: Denies  Emotional Status Pt's affect, behavior adn adjustment status: Pt very pleasant and engaged throughout interview.  Wife actually answers a lot of the questions and pt laughs at her for doing this.  He describes himself as a "glass 1/2 full" type of person.  Wife reports that she has been "amazed at how steady he has been through all of this."  He explains that he has always been one who, when given a problem situation, he remains focused on "working my way through it..."  Will monitor through stay and will refer for neuropsych consult due to lengthy illness and multiple life  changes. Recent Psychosocial Issues: None Pyschiatric History: None Substance Abuse History: None  Patient / Family Perceptions, Expectations & Goals Pt/Family understanding of illness & functional limitations: Pt and wife with very good understanding of his multiple medical issues and current limitations/ need for CIR. Premorbid pt/family roles/activities: Pt was completely independent and active home and community. Anticipated changes in roles/activities/participation: Pt will require 24/7 assistance and wife to assume primary caregiver role. Pt/family expectations/goals: "I just want to get as strong as I can."  [employer]: None Premorbid Home Care/DME Agencies: None Transportation available at discharge: yes Resource referrals recommended: Neuropsychology, Support group (specify)  Discharge Planning Living Arrangements: Spouse/significant other Support Systems: Spouse/significant other, Children, Other relatives, Water engineer, Social worker community Type of Residence: Private residence Insurance underwriter Resources: [employer], Multimedia programmer (specify) Sports administrator) Financial Resources: [place] Referred: No Living Expenses: Higher education careers adviser Management: Patient  Does the patient have any problems obtaining your medications?: No Home Management: pt and wife shared PTA Patient/Family Preliminary Plans: Pt to return home with wife and family providing assistance. Barriers to Discharge:  (ramp to be built) Social Work Anticipated Follow Up Needs: HH/OP, Support Group Expected length of stay: 3 weeks  Clinical Impression Very pleasant gentleman here following a lengthy hospitalizations and multiple medical issues including several amputations.  He is very motivated for therapies and wife at bedside and very supportive.  Wife admits to some anxiety about being able to provide all the care he will need at home, but feels her children, extended family  and friends/ neighbors will also assist.  Will follow for d/c planning and support needs.  Synthia Fairbank 02/08/2017, 4:55 PM

## 2017-02-08 NOTE — Care Management Note (Signed)
[facility] Individual Statement of Services  Patient Name:  Scott [name]  Date:  02/08/2017  Welcome to the [facility].  Our goal is to provide you with an individualized program based on your diagnosis and situation, designed to meet your specific needs.  With this comprehensive rehabilitation program, you will be expected to participate in at least 3 hours of rehabilitation therapies Monday-Friday, with modified therapy programming on the weekends.  Your rehabilitation program will include the following services:  Physical Therapy (PT), Occupational Therapy (OT), Speech Therapy (ST), 24 hour per day rehabilitation nursing, Therapeutic Recreaction (TR), Neuropsychology, Case Management (Social Worker), Rehabilitation Medicine, Nutrition Services and Pharmacy Services  Weekly team conferences will be held on Tuesdays to discuss your progress.  Your Social Worker will talk with you frequently to get your input and to update you on team discussions.  Team conferences with you and your family in attendance may also be held.  Expected length of stay: 3 weeks Overall anticipated outcome: min/ moderate assistanc  Depending on your progress and recovery, your program may change. Your Social Worker will coordinate services and will keep you informed of any changes. Your Social Worker's name and contact numbers are listed  below.  The following services may also be recommended but are not provided by the [facility] will be made to provide these services after discharge if needed.  Arrangements include referral to agencies that provide these services.  Your insurance has been verified to be:  Medicare and [facility] Your primary doctor is:  Dr. Quintin Alto  Pertinent information will be shared with your doctor and your insurance  company.  Social Worker:  [place], [place] or [phone]   Information discussed with and copy given to patient by: Lennart Pall, 02/08/2017, 4:39 PM

## 2017-02-08 NOTE — Progress Notes (Signed)
ANTICOAGULATION CONSULT NOTE - Follow Up Consult  Pharmacy Consult for coumadin Indication: afib / RV thrombus  Allergies  Allergen Reactions  . Doxycycline Hives  . Penicillins Hives     Has patient had a PCN reaction causing immediate rash, facial/tongue/throat swelling, SOB or lightheadedness with hypotension: No Has patient had a PCN reaction causing severe rash involving mucus membranes or skin necrosis: No Has patient had a PCN reaction that required hospitalization: No Has patient had a PCN reaction occurring within the last 10 years:# # # YES # # #  If all of the above answers are "NO", then may proceed with Cephalosporin use.   Scott [name] Antibiotics Hives    Patient Measurements: Weight: 160 lb 11.2 oz (72.9 kg) Heparin Dosing Weight:   Vital Signs: Temp: 97.6 F (36.4 C) (03/19 0407) Temp Source: Oral (03/19 0407) BP: 111/81 (03/19 0407) Pulse Rate: 81 (03/19 0407)  Labs:  Recent Labs  02/05/17 2328 02/06/17 0942 02/07/17 0729 02/08/17 0600  HGB 8.4*  --   --   --   HCT 28.5*  --   --   --   PLT 357  --   --   --   LABPROT  --  23.2* 24.2* 24.1*  INR  --  2.03 2.13 2.12  CREATININE 0.66  --  0.76  --     Estimated Creatinine Clearance: 85.9 mL/min (by C-G formula based on SCr of 0.76 mg/dL).   Medications:  Scheduled:  . amiodarone  100 mg Oral Daily  . aspirin EC  81 mg Oral Daily  . atenolol  25 mg Oral Daily  . atorvastatin  40 mg Oral q1800  . ciprofloxacin  500 mg Oral BID  . digoxin  0.125 mg Oral Daily  . famotidine  20 mg Oral BID  . ferrous sulfate  325 mg Oral BID WC  . furosemide  40 mg Oral BID  . gabapentin  300 mg Oral BID  . hydrocerin   Topical BID  . insulin aspart  0-5 Units Subcutaneous QHS  . insulin aspart  0-9 Units Subcutaneous TID WC  . insulin glargine  5 Units Subcutaneous BID  . lisinopril  2.5 mg Oral Daily  . Melatonin  3 mg Oral QHS  . potassium chloride  20 mEq Oral Daily  . rOPINIRole  2 mg Oral QHS  .  sertraline  75 mg Oral Daily  . vitamin C  500 mg Oral Daily  . warfarin  5 mg Oral q1800  . Warfarin - Pharmacist Dosing Inpatient   Does not apply q1800  . zinc sulfate  220 mg Oral BID  . zolpidem  5 mg Oral QHS   Infusions:    Assessment: 71 yo male with afib and RV thrombus is currently on therapeutic coumadin.  INR today is 2.12.  Goal of Therapy:  INR 2-3 Monitor platelets by anticoagulation protocol: Yes   Plan:  - continue coumadin 5 mg po daily - INR in am  Lauri Purdum, Tsz-Yin 02/08/2017,8:10 AM

## 2017-02-08 NOTE — Progress Notes (Signed)
[facility]     PROGRESS NOTE    Subjective/Complaints: Had a reasonable weekend. Denies pain. Appetite good  ROS: pt denies nausea, vomiting, diarrhea, cough, shortness of breath or chest pain   Objective: Vital Signs: Blood pressure 111/81, pulse 81, temperature 97.6 F (36.4 C), temperature source Oral, resp. rate 18, weight 72.9 kg (160 lb 11.2 oz), SpO2 100 %. No results found.  Recent Labs  02/05/17 2328  WBC 10.9*  HGB 8.4*  HCT 28.5*  PLT 357    Recent Labs  02/05/17 2328 02/07/17 0729  NA 137 138  K 2.7* 3.5  CL 99* 104  GLUCOSE 114* 96  BUN 12 12  CREATININE 0.66 0.76  CALCIUM 8.1* 8.7*   CBG (last 3)   Recent Labs  02/07/17 1635 02/07/17 2044 02/08/17 0624  GLUCAP 159* 112* 100*    Wt Readings from Last 3 Encounters:  02/05/17 72.9 kg (160 lb 11.2 oz)  01/31/17 101 kg (222 lb 10.6 oz)  02/04/17 101 kg (222 lb 10.6 oz)    Physical Exam:  Constitutional: He is oriented to person, place, and time. He appears well-developed and well-nourished.  No distress  HENT:  Head: Normocephalic and atraumatic.  Eyes: Conjunctivae are normal. Pupils are equal, round, and reactive to light.  Neck: Normal range of motion. Neck supple.  Continued leagage from open stoma Cardiovascular: RRR.   Respiratory: CTA.    GI: Soft. Bowel sounds are normal. He exhibits no distension. There is no tenderness.  PEG site clean Genitourinary:  Genitourinary Comments: Foley in place. Scrotal dressing in place.  Musculoskeletal:  B-BKA incision with dry scabs--left with dehisced edge medially and right with dehisced edge laterally which is more involved and necrotic  Right MCP site with incision intact and sutures in place, dry.   Multiple dry eschars on bilateral residual tibial and knees, right inner thigh at graft site. Healing abrasion medial thigh.   Neurological: He is alert and oriented to person, place, and time.  Coordination abnormal.  Speech clear. Reasonable insight and awareness. UE limited by discomfort to an extent. B/L deltoid, biceps, triceps are grossly 3+/5. Wrist and fingers limited by splints and amputations. LE: 2/5 HF, 3/5 KE.   Skin: Skin is warm and dry.  buttocks reddened with healed ulcers. Left BK wound with scabs and without significant drainage. Right BK with necrotic tissue along incision particularly along the lateral aspect of the incision. PEG site intact.   Psychiatric: He has a normal mood and affect. His behavior is normal. Thought content normal.     Assessment/Plan: 1. Debility and mobility/functional deficits secondary to multiple medical issues, 4 limb amputations which require 3+ hours per day of interdisciplinary therapy in a comprehensive inpatient rehab setting. Physiatrist is providing close team supervision and 24 hour management of active medical problems listed below. Physiatrist and rehab team continue to assess barriers to discharge/monitor patient progress toward functional and medical goals.  Function:  Bathing Bathing position   Position: Sitting EOB  Bathing parts Body parts bathed by patient: Chest, Abdomen, Front perineal area, Right upper leg, Left upper leg Body parts bathed by helper: Right arm, Left arm, Buttocks, Back  Bathing assist Assist Level:  (Mod A)      Upper Body Dressing/Undressing Upper body dressing   What is the patient wearing?: Pull over shirt/dress     Pull over shirt/dress - Perfomed by patient: Thread/unthread right sleeve, Thread/unthread left sleeve Pull over shirt/dress - Perfomed  by helper: Pull shirt over trunk, Put head through opening        Upper body assist Assist Level:  (Mod A)      Lower Body Dressing/Undressing Lower body dressing   What is the patient wearing?: Underwear, Pants   Underwear - Performed by helper: Thread/unthread right underwear leg, Thread/unthread left underwear leg, Pull underwear  up/down   Pants- Performed by helper: Thread/unthread right pants leg, Thread/unthread left pants leg, Pull pants up/down                      Lower body assist Assist for lower body dressing:  (Total A)      Toileting Toileting Toileting activity did not occur: No continent bowel/bladder event        Toileting assist     Transfers Chair/bed transfer   Chair/bed transfer method: Lateral scoot Chair/bed transfer assist level: Total assist (Pt < 25%) (beezy board)       Manufacturing systems engineer     Max wheelchair distance: 158ft  Assist Level: Dependent (Pt equals 0%) (in TIS WC )  Cognition Comprehension Comprehension assist level: Understands complex 90% of the time/cues 10% of the time  Expression Expression assist level: Expresses complex 90% of the time/cues < 10% of the time  Social Interaction Social Interaction assist level: Interacts appropriately with others - No medications needed.  Problem Solving Problem solving assist level: Solves basic 90% of the time/requires cueing < 10% of the time  Memory Memory assist level: Recognizes or recalls 90% of the time/requires cueing < 10% of the time   Medical Problem List and Plan: 1.  Deconditioning and functional/mobility deficits secondary to complications after CABG/AVR with subsequent respiratory failure as well as ischemic injury to all 4 limbs which required bilateral BKA's and bilateral TM-C amputations.              -continue therapies  -using adaptive tools for feeding 2.  RV thrombus/Anticoagulation: Pharmaceutical: Coumadin and Heparin 3. Pain Management: Prn medications effective.  4. Mood: Motivated to get stronger. LCSW to follow for evaluation and support.  5. Neuropsych: This patient is capable of making decisions on his own behalf. 6. Skin/Wound Care: Monitor wounds for healing.  Continue dressing changes to scrotum as needed             -voiding trial, remove foley  -pink  tape for trach stoma 7. Fluids/Electrolytes/Nutrition: Monitor intake with I/O. Add supplement to promote healing.  8. CAD s/p AVR: continue IV heparin till INR therapeutic.On ASA, Atenolol and Lipitor.  9. A fib: Monitor HR bid. Continue digoxin, atenolol and amiodarone.  10. B-BKA, Left transmetatarsal and right MCP amputations due to DIC:               -changed to santyl dressing RLE  -continue other dressing changes as we've been doing  11. 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  12. Diarrhea: generally mproving on lomotil prn 13. Anemia of chronic illness: Slowly improving --will continue to monitor for trends.  14. T2DM: Monitor BS ac/hs. Continue lantus insulin and titrate as indicated.   -SSI 15. HTN: Monitor BP bid--on lisinopril, lasix, digoxin and atenolol   LOS (Days) 3 A FACE TO FACE EVALUATION WAS PERFORMED  Meredith Staggers, MD 02/08/2017 8:48 AM

## 2017-02-08 NOTE — Progress Notes (Signed)
Meredith Staggers, MD Physician Signed Physical Medicine and Rehabilitation  PMR Pre-admission Date of Service: 02/05/2017 10:21 AM  Related encounter: Admission (Discharged) from 11/18/2016 in [facility]       _0 Hide copied text          Cristina Gong, RN Rehab Admission Coordinator Shared Physical Medicine and Rehabilitation  PMR Pre-admission Encounter Date: 02/04/2017   Related encounter: Documentation from 02/04/2017 in [facility]         _1 Hide copied text _2 Hover for attribution information   [employer] PMR Admission Coordinator Pre-Admission Assessment  Patient:Scott B Turneris an 71 y.o., male ZOX:[id number] DOB:1946-01-25 Height:_3  (175.3 cm) Weight:101 kg (222 lb 10.6 oz)  Insurance Information HMO: PPO: PCP: IPA:80/20: yesOTHER: no HMO PRIMARY: Medicare a and bPolicy#: [id number] aSubscriber: pt Benefits: Phone #: passport one online 02/01/2017 Eff. Date: Deduct: $1340Out of Pocket Max: noneLife Max: none CIR: 100% SNF: 20 full days Outpatient:80%Co-Pay: 20% Home Health: 100%Co-Pay: none DME: 80%Co-Pay: 20% Providers: pt choice  SECONDARY: United Health CarePolicy#: [id number] Subscriber: pt Pt hospitalized [place] 10/20/2016 until 11/18/2016 and admitted to  11/18/17 until 02/05/2017 for a total of 110 days. Has used 19 days of LIFETIME reserve days. I have reviewed this with wife and daughter, [name], who state understanding.  Medicaid Application Date:Case Manager: Disability Application Date:Case Worker:  Emergency [place]   Name Relation Home Work Mobile   [place] Spouse [phone]  [phone]   [place]a A[place] Daughter   [phone]   Cuthbertson,Amy Daughter   [phone]      Current Medical  History Patient Admitting Diagnosis:debility due to complex medical  History of Present Illness: 71 year old man with no previous history of CAD but known heart murmur and risk factors notable for type @ DM, hyperlipidemia and remote history of tobacco abuse who was referred for surgical consultation for management of severe symptomatic aortic stenosis and CAD with unstable angina pectoris. Patient admitted 10/20/2016 for cardiac catherization. Due to the severity of his anatomical disease and severe/critical aortic stenosis his best option was felt to be CABG with AVR. Underwent surgery 10/22/2016. The procedure was complicated by a severe intraoperative coagulopathy.   The patient had a prolonged and difficult postoperative course. Critical Care medicine was consulted and pt trached with extensive efforts to wean. The advanced Heart Team was also consulted to assist with management. It was initially presumed pt had HITT however it was later felt that the severe coagulopathy was more of an issue related to DIC. He was treated with ANgiomax for a term. ON 10/26/2016 he returned to the OR for closure of his sternum. He remained vent dependent and required trach.   During the early course of his hospitalization he developed severe ischemic extremities and developed necrotic portions of bilateral hand and feet.   ID was consulted and pt treated for possible endocarditis. Additionally he developed C. Diff and treated with VANC.   The patient did develop skin edge necrosis of his right thigh wound requiring debridement and placement of a VAC on 11/17/2016. Pt was then stabilized and admitted to [facility] on 11/18/2016.  At [facility], patient had progressive gangrenous changes to both feet and hands. Pt taken to OR on 12/11/2016 and with Dr. Sharol Given performed transtibial amputations bilaterally, application of VAC to both, left transmetacarpal amputation of hand, right MCP amputation of hand, injection  bilateral transtibial amputations with TXA 50 ml per amp. The right  thumb was viable and an amputation was performed through the MCP joint for the index to left finger.  Dr. Karsten Ro on 02/01/2017 took pt to OR due to erosion over the pump on the lateral aspect of the right hemiscrotum from his penile prosthesis which was originally implanted on 4/09. Removal of three piece inflatable penile prosthesis with 16 french foley catheter, quarter inch penrose drains places in the proximal and distal corpus cavernosum, half inch penrose drain in the previous reservoir site.   Throughout pt's stay at [facility], pt gradually weaned to trach collar from vent and pt eventually de cannulated trach on 02/01/2017 postoperatively.   Patient's medical [facility] from [facility] has been reviewed by the rehabilitation admission coordinator and physician.  NIH Stroke scale: Glascow Coma Scale:  Past Medical History     Past Medical History:  Diagnosis Date  . Arthritis    "hips, shoulders; knees; back" (10/20/2016)  . Bicuspid aortic valve   . BPH (benign prostatic hypertrophy)   . Carpal tunnel syndrome of right wrist   . Coronary artery disease involving native coronary artery of native heart with unstable angina pectoris ([place]) 10/20/2016  . Diverticulitis   . GERD (gastroesophageal reflux disease)   . Gout   . Heart murmur   . Hyperlipemia   . Hypertension   . Postoperative atrial fibrillation ([place]) 10/24/2016  . RLS (restless legs syndrome)   . S/P aortic valve replacement with bioprosthetic valve 10/22/2016   25 mm [facility] Ease bovine pericardial bioprosthetic tissue valve  . S/P ascending aortic aneurysm repair 10/22/2016   28 mm supracoronary straight graft replacement of ascending thoracic aortic aneurysm  . S/P CABG x 3 10/22/2016   Sequential LIMA to Diag and LAD, SVG to distal LAD, open vein harvest right thigh  . S/P mitral valve repair 10/22/2016    Artificial Gore-tex neochord placement x6 - posterior annuloplasty band placed but removed due to systolic anterior motion of mitral valve  . Severe aortic stenosis   . Snores    Never been tested for sleep apnea  . Thoracic ascending aortic aneurysm ([place]) 10/21/2016  . Thrombocytopenia ([place]) 10/22/2016  . Type II diabetes mellitus (HCC)     Family History family history includes Cancer in his sister; Clotting disorder in his father; Heart disease (age of onset: 59) in his sister; Lung cancer in his mother.  Prior Rehab/Hospitalizations Has the patient had major surgery during 100 days prior to admission? No   Current Medications Refer to [facility]  Patients Current Diet: Regular diet with thin liquids  Precautions / Restrictions Precautions Precautions/Special Needs: Swallowing Precaution Comments: trach decannulation 02/01/17 Restrictions Weight Bearing Restrictions: Yes Other Position/Activity Restrictions: no longer has sternal precautions  Has the patient had 2 or more falls or a fall with injury in the past year?No  Prior Activity Level Community (5-7x/wk): Independent, retired, and driving pta 38/1829  Prior Functional Level Self Care: Did the patient need help bathing, dressing, using the toilet or eating? Independent  Indoor Mobility: Did the patient need assistance with walking from room to room (with or without device)? Independent  Stairs: Did the patient need assistance with internal or external stairs (with or without device)? Independent  Functional Cognition: Did the patient need help planning regular tasks such as shopping or remembering to take medications? Independent  Home Assistive Devices / Equipment Home Assistive Devices/Equipment: Eyeglasses  Prior Device Use: Indicate devices/aids used by the patient prior to current illness, exacerbation or injury? None of  the above   Prior Functional Level Current  Functional Level  Bed Mobility  Independent  Mod assist   Transfers  Independent  Total assist   Mobility - Walk/Wheelchair  Independent  (not attempted)   Upper Body Dressing  Independent  Total assist   Lower Body Dressing  Independent  Total assist   Grooming  Independent  Mod assist   Eating/Drinking  Independent  Mod assist   Toilet Transfer  Independent  Total assist   Bladder Continence   continent  foley placed 02/01/2017 after surgical removal of penile implant   Bowel Management  continent  continent   Stair Climbing  Independent  (unable due to B BKA)   Communication  independent  independent   Memory  intact  intact   Cooking/Meal Prep  independent    Housework  independent    Money Management  independent    Driving  yes      Special needs/care consideration BiPAP/CPAP N/a CPM N/a Continuous Drip IV N/a Dialysis N/a Life Vest N/a Oxygen N/a Special Bed On a dophin bed on [facility] Trach Size de cannulated 02/01/2017 Wound Vac N/a Skin:  Sacrum/bilateral buttocks reddened, scrotum advance penrose drain 2 cm daily and wearing net briefs,, surgical 02/01/2017 coban dressing to right hand surgical site change daily beginning 02/07/2107, bilateral BKA surgical incisions site with scab area to site Bowel mgmt: continent, Last BM 02/04/17 Bladder mgmt: indwelling catheter placed 02/01/2017 during surgery Diabetic mgmt Yes  Previous Home Environment Living Arrangements: Spouse/significant other Lives With: Spouse Available Help at Discharge: Family, Available 24 hours/day (wife, two daughters and son very committed) Type of Home: House Home Layout: Two level, 1/2 bath on main level (will set up bedroom downstairs in dining room) Home Access: Stairs to enter Entrance Stairs-Rails: None Entrance Stairs-Number of Steps: 2 steps to porch then one step in (wife met with  contractor for ramp on 02/03/2017) Bathroom Shower/Tub: Tub/shower unit (upstairs) Bathroom Toilet: Standard Bathroom Accessibility: Yes How Accessible: (upstairs)  Discharge Living Setting Plans for Discharge Living Setting: Patient's home, Lives with (comment) (wife) Type of Home at Discharge: House Discharge Home Layout: Two level, 1/2 bath on main level (setting up bedroom downstairs in dining room) Discharge Home Access: Stairs to enter Entrance Stairs-Rails: None Entrance Stairs-Number of Steps: 2 step entry onto porch and then one step into home (wife met with ramp builder 02/03/2017) Discharge Bathroom Shower/Tub: Tub/shower unit (upstairs) Discharge Bathroom Toilet: Standard Discharge Bathroom Accessibility: Yes How Accessible: Accessible via walker (upstairs) Does the patient have any problems obtaining your medications?: No  Pt's daughter, [name], has offered for her parents to move in with her for she has a handicapped home, wheelchair accessible, she bought from a previous owner.  Social/Family/Support Systems Patient Roles: Spouse, Parent Contact Information: Maurio Baize, wife Anticipated Caregiver: wife, two daughters and son Anticipated Caregiver's Contact Information: [name] cell [phone] Ability/Limitations of Caregiver: no limitations for wife and daughters Caregiver Availability: 24/7 Discharge Plan Discussed with Primary Caregiver: Yes Is Caregiver In Agreement with Plan?: Yes Does Caregiver/Family have Issues with Lodging/Transportation while Pt is in Rehab?: No  Wife very involved and hands on, but she wants to assure that home health will be arranged to assist her with wound/skin care management issues at discharge.  Goals/Additional Needs Patient/Family Goal for Rehab: min- mod assist with PT and OT at wheelchair level, supervision to Mod I with SLP Expected length of stay: ELOS 7- 10 days Equipment Needs: motorized wheelchair, BSC, [place] for follow  up wound care Special Service Needs: all amputations are new this admission. ([facility]) Pt/Family Agrees to Admission and willing to participate: Yes Program Orientation Provided &Reviewed with Pt/Caregiver Including Roles &Responsibilities: Yes  Patient Condition:Patient's [facility] and [facility] medical records have been reviewed, as well as onsite assessment of patient and interviews with patient and family. Patient will benefit form a comprehensive inpatient admission and will benefit from our coordinated team approach. He will receive 3 hours of PT, OT, and SLP daily along with rehabilitative Medical and Nursing Care. Patient is overall max to total assist. We will admit pt to inpt rehab today.   Preadmission Screen Completed By: Cleatrice Burke, 3/15/201810:22 PM ______________________________________________________________________  Discussed status with Dr. Naaman Plummer on 02/05/17 at 1026 and received telephone approval for admission today.  Admission Coordinator: Cleatrice Burke, time 1026/Date 02/05/17   Assessment/Plan: Diagnosis: Functional and mobility deficits secondary to debility and multiple ischemic limbs after CABG/AVR. S/P bilateral BKA's and bilateral TMC amputations 1. Does the need for close, 24 hr/day Medical supervision in concert with the patient's rehab needs make it unreasonable for this patient to be served in a less intensive setting? Yes 2. Co-Morbidities requiring supervision/potential complications: wound care, pain, pulmonary mgt, nutrition 3. Due to bladder management, bowel management, safety, skin/wound care, disease management, medication administration, pain management and patient education, does the patient require 24 hr/day rehab nursing? Yes 4. Does the patient require coordinated care of a physician, rehab nurse, PT (1-2 hrs/day, 5 days/week), OT (1-2 hrs/day, 5 days/week) and SLP (1-2  hrs/day, 5 days/week)to address physical and functional deficits in the context of the above medical diagnosis(es)? Yes Addressing deficits in the following areas: balance, endurance, locomotion, strength, transferring, bowel/bladder control, bathing, dressing, feeding, grooming, toileting, cognition, speech, swallowing and psychosocial support 5. Can the patient actively participate in an intensive therapy program of at least 3 hrs of therapy 5 days a week? Yes 6. The potential for patient to make measurable gains while on inpatient rehab is excellent 7. Anticipated functional outcomes upon discharge from inpatients are: min assist and mod assistPT, min assist and mod assistOT, modified independentSLP---w/c level goals 8. Estimated rehab length of stay to reach the above functional goals is: 8-11 days 9. Does the patient have adequate social supports to accommodate these discharge functional goals? Yes 10. Anticipated D/C setting: Home 11. Anticipated post D/C treatments: HH therapy and Outpatient therapy 12. Overall Rehab/Functional Prognosis: excellent    RECOMMENDATIONS: This patient's condition is appropriate for continued rehabilitative care in the following setting: CIR Patient has agreed to participate in recommended program. Yes Note that insurance prior authorization may be required for reimbursement for recommended care.  Comment: Admit to inpatient rehab today  Meredith Staggers, MD, [place] [facility] Rehabilitation 02/05/2017   Cleatrice Burke 02/04/2017         Revision History

## 2017-02-08 NOTE — Progress Notes (Signed)
Occupational Therapy Session Note  Patient Details  Name: Scott [name] MRN: [id number] Date of Birth: Apr 15, 1946  Today's Date: 02/08/2017 OT Individual Time: 1045-1200 OT Individual Time Calculation (min): 75 min    Short Term Goals: Week 1:  OT Short Term Goal 1 (Week 1): Pt will complete bathing with Min A and use of bath mits OT Short Term Goal 2 (Week 1): Pt will complete toilet transfer with LRAD and Max A OT Short Term Goal 3 (Week 1): Pt will maintain dynamic sitting balance at EOB with supervision during ADLs OT Short Term Goal 4 (Week 1): Pt will complete self feeding with AE and setup  Skilled Therapeutic Interventions/Progress Updates:    Pt seen for OT session focusing on ADL re-training  And family education. Pt sitting up in w/c upon arrival, daughter and wife present. Pt denying pain and agreeable to tx session.  RN arrived and requested need to change pt's brief due to recent removal of foley catheter. He completed lateral leans onto bed and into chair with assist for balance and VCs for technique for clothing management and donning new brief. Educated regarding functional use of lateral leans for clothing management during toileting/ LB dressing tasks. Pt completed oral hygiene at sink, pt directing care with proper set-up of universal cuff and total A for set-up.  Education, demonstration, and teach back provided for boosting in chair for pressure relief in tilt-in-space w/c, pt's wife return demonstrated ability to complete properly.  Introduced pt and family to [place] lift, demonstration provided on use and use within home. Extensive time spent discussing d/c planning and tx focus. Pt desiring to use Beasy Board for transfer, however, due to sacral and scrotal wounds as well as wounds on UEs, would recommend use of lift for skin protection.  Pt taken back to room and maximove used to transfer pt back to bed, he rolled mod I with use of bed rails for pad to be removed.  Left in supine with all needs in reach and family members present to assist with set-up of lunch, Educated throughout session regarding role OT, POC, OT/PT goals, DME and d/c planning.   Therapy Documentation Precautions:  Precautions Precautions: Fall, Other (comment) Precaution Comments: Left UE transmetacarpal amputation, R UE MCP amputation, bilateral BKA, G tube Restrictions Weight Bearing Restrictions: Yes Pain:   No/ denies pain ADL: ADL ADL Comments: Please see functional navigator for ADL status Exercises:   Other Treatments:    See Function Navigator for Current Functional Status.   Therapy/Group: Individual Therapy  Lewis, Habiba Treloar C 02/08/2017, 7:19 AM

## 2017-02-08 NOTE — Progress Notes (Signed)
Physical Therapy Session Note  Patient Details  Name: Scott [name] MRN: [id number] Date of Birth: [birth year]  Today's Date: 02/08/2017 PT Individual Time: 1415-1530 PT Individual Time Calculation (min): 75 min   Short Term Goals: Week 1:  PT Short Term Goal 1 (Week 1): Patient will maintain sitting balance with supervision assist  PT Short Term Goal 2 (Week 1): patient will perform WC<>bed transfer with max assist consistently PT Short Term Goal 3 (Week 1): Patient will perform bed mobility with min assist form PT.  PT Short Term Goal 4 (Week 1): Patient will iniate WC proplusion.    Skilled Therapeutic Interventions/Progress Updates: Pt received supine in bed, denies pain and agreeable to treatment. Discussed long term mobility plan with pt; educated regarding limitations on insurance payment of power mobility vs prosthetics, and also discussed that if he opted for manual chair he would be dependent for mobility in the chair until he was ambulatory with prosthetics. Pt verbalizes understanding, states he is most interested in prosthetics and plans to have 24/7 assist at home and would not need to propel himself within the home. Supine>sit with bedrails and HOB elevated modI. Transfer bed>w/c max/totalA, +2A for safety and use of beasy board. Transfer w/c <>mat table with transfer board and maxA +1; mod cues for body positioning, weight shifting, and to prevent weight bearing through [place]. Sit >supine on mat with minA for repositioning hips in supine. Straight leg raise and glute sets 1x10 reps each to review what exercises pt had been performing already. Supine>long sit with min guard to prop on elbows, maxA elbows>long sit d/t weight bearing restrictions. Hamstring stretch in long sitting x3 min; educated pt on long sitting position to perform functional ADLs. Long sit>short sit with S for dynamic balance. Semi-reclined situps x5 reps with modA. Seated LE exercises x15 reps each including hip  flexion marching, long arc quad, hip adduction pillow squeeze isometric. Educated pt on pressure relief in tilt-in-space w/c, provided pt with timer to use to keep track of time spent up in w/c and request assist for pressure relief. Remained seated in w/c at end of session, all needs in reach.      Therapy Documentation Precautions:  Precautions Precautions: Fall, Other (comment) Precaution Comments: Left UE transmetacarpal amputation, R UE MCP amputation, bilateral BKA, G tube Restrictions Weight Bearing Restrictions: Yes  See Function Navigator for Current Functional Status.   Therapy/Group: Individual Therapy  Luberta Mutter 02/08/2017, 3:38 PM

## 2017-02-08 NOTE — Progress Notes (Signed)
Physical Therapy Note  Patient Details  Name: Scott [name] MRN: [id number] Date of Birth: [birth year] Today's Date: 02/08/2017    Time: 081-388 71 minutes  1:1 No c/o pain.  Pt performed rolling with min A to don shorts, supine to sit with mod/max A for trunk control.  Beezy board transfer to w/c with max/total A, manual facilitation for forward wt shifts and balance on beezy board.  Session focused on sitting balance with ball tap and reaching activities, pt min/mod A for reaching tasks.  Seated LE therex with LAQ with 5 second holds and glute squeezes 2 x 10.   DONAWERTH,KAREN 02/08/2017, 9:29 AM

## 2017-02-09 ENCOUNTER — Inpatient Hospital Stay (HOSPITAL_COMMUNITY): Payer: Medicare Other | Admitting: Speech Pathology

## 2017-02-09 ENCOUNTER — Inpatient Hospital Stay (HOSPITAL_COMMUNITY): Payer: Medicare Other | Admitting: Occupational Therapy

## 2017-02-09 ENCOUNTER — Inpatient Hospital Stay (HOSPITAL_COMMUNITY): Payer: Medicare Other | Admitting: Physical Therapy

## 2017-02-09 ENCOUNTER — Encounter (HOSPITAL_COMMUNITY): Payer: Medicare Other | Admitting: Psychology

## 2017-02-09 ENCOUNTER — Inpatient Hospital Stay (HOSPITAL_COMMUNITY): Payer: Medicare Other

## 2017-02-09 DIAGNOSIS — Z89511 Acquired absence of right leg below knee: Secondary | ICD-10-CM

## 2017-02-09 DIAGNOSIS — T8131XS Disruption of external operation (surgical) wound, not elsewhere classified, sequela: Secondary | ICD-10-CM

## 2017-02-09 DIAGNOSIS — Z89512 Acquired absence of left leg below knee: Secondary | ICD-10-CM

## 2017-02-09 LAB — PROTIME-INR
INR: 2.59
PROTHROMBIN TIME: 28.2 s — AB (ref 11.4–15.2)

## 2017-02-09 LAB — GLUCOSE, CAPILLARY
GLUCOSE-CAPILLARY: 102 mg/dL — AB (ref 65–99)
GLUCOSE-CAPILLARY: 122 mg/dL — AB (ref 65–99)
Glucose-Capillary: 118 mg/dL — ABNORMAL HIGH (ref 65–99)
Glucose-Capillary: 141 mg/dL — ABNORMAL HIGH (ref 65–99)

## 2017-02-09 NOTE — Progress Notes (Signed)
[facility]     PROGRESS NOTE    Subjective/Complaints: No new issues overnight. Slept well. Appetite still good  ROS: pt denies nausea, vomiting, diarrhea, cough, shortness of breath or chest pain   Objective: Vital Signs: Blood pressure 106/68, pulse 80, temperature 98.2 F (36.8 C), temperature source Oral, resp. rate 18, weight 71.2 kg (157 lb), SpO2 100 %. No results found. No results for input(s): WBC, HGB, HCT, PLT in the last 72 hours.  Recent Labs  02/07/17 0729  NA 138  K 3.5  CL 104  GLUCOSE 96  BUN 12  CREATININE 0.76  CALCIUM 8.7*   CBG (last 3)   Recent Labs  02/08/17 1714 02/08/17 2136 02/09/17 0629  GLUCAP 121* 132* 102*    Wt Readings from Last 3 Encounters:  02/09/17 71.2 kg (157 lb)  01/31/17 101 kg (222 lb 10.6 oz)  02/04/17 101 kg (222 lb 10.6 oz)    Physical Exam:  Constitutional: He is oriented to person, place, and time. He appears well-developed and well-nourished.  No distress  HENT:  Head: Normocephalic and atraumatic.  Eyes: Conjunctivae are normal. Pupils are equal, round, and reactive to light.  Neck: Normal range of motion. Neck supple.  Stoma still open.  Cardiovascular: RRR.   Respiratory: CTA.    GI: Soft. Bowel sounds are normal. He exhibits no distension. There is no tenderness.  PEG site clean Genitourinary:  Genitourinary Comments: Foley in place. Scrotal dressing in place.  Musculoskeletal:  B-BKA incisions-left with dehisced edge medially and right with dehisced edge laterally which is more involved and necrotic--stable  Right MCP site with incision intact and sutures in place, dry, dressed  Multiple dry eschars on bilateral residual tibial and knees, right inner thigh at graft site. Healing abrasion medial thigh.   Neurological: He is alert and oriented to person, place, and time. Coordination abnormal.  Speech clear. Reasonable insight and awareness. UE limited by discomfort  to an extent. B/L deltoid, biceps, triceps are grossly 3+/5. Wrist and fingers limited by splints and amputations. LE: 2/5 HF, 3/5 KE.   Skin: Scrotal wound with drainage from drain site buttocks reddened with healed ulcers.    Psychiatric: pleasant and appropriate    Assessment/Plan: 1. Debility and mobility/functional deficits secondary to multiple medical issues, 4 limb amputations which require 3+ hours per day of interdisciplinary therapy in a comprehensive inpatient rehab setting. Physiatrist is providing close team supervision and 24 hour management of active medical problems listed below. Physiatrist and rehab team continue to assess barriers to discharge/monitor patient progress toward functional and medical goals.  Function:  Bathing Bathing position   Position: Sitting EOB  Bathing parts Body parts bathed by patient: Chest, Abdomen, Front perineal area, Right upper leg, Left upper leg Body parts bathed by helper: Right arm, Left arm, Buttocks, Back  Bathing assist Assist Level:  (Mod A)      Upper Body Dressing/Undressing Upper body dressing   What is the patient wearing?: Pull over shirt/dress     Pull over shirt/dress - Perfomed by patient: Thread/unthread right sleeve, Thread/unthread left sleeve Pull over shirt/dress - Perfomed by helper: Pull shirt over trunk, Put head through opening        Upper body assist Assist Level:  (Mod A)      Lower Body Dressing/Undressing Lower body dressing   What is the patient wearing?: Underwear, Pants   Underwear - Performed by helper: Thread/unthread right underwear leg, Thread/unthread left underwear leg,  Pull underwear up/down   Pants- Performed by helper: Thread/unthread right pants leg, Thread/unthread left pants leg, Pull pants up/down                      Lower body assist Assist for lower body dressing:  (Total A)      Toileting Toileting Toileting activity did not occur: No continent bowel/bladder  event   Toileting steps completed by helper: Adjust clothing prior to toileting, Performs perineal hygiene, Adjust clothing after toileting    Toileting assist Assist level: Two helpers   Transfers Chair/bed transfer   Chair/bed transfer method: Lateral scoot Chair/bed transfer assist level: Total assist (Pt < 25%) Chair/bed transfer assistive device: Sliding board     Locomotion Ambulation           Wheelchair     Max wheelchair distance: 195ft  Assist Level: Dependent (Pt equals 0%) (in TIS WC )  Cognition Comprehension Comprehension assist level: Understands complex 90% of the time/cues 10% of the time  Expression Expression assist level: Expresses complex 90% of the time/cues < 10% of the time  Social Interaction Social Interaction assist level: Interacts appropriately with others - No medications needed.  Problem Solving Problem solving assist level: Solves basic 90% of the time/requires cueing < 10% of the time  Memory Memory assist level: Recognizes or recalls 75 - 89% of the time/requires cueing 10 - 24% of the time   Medical Problem List and Plan: 1.  Deconditioning and functional/mobility deficits secondary to complications after CABG/AVR with subsequent respiratory failure as well as ischemic injury to all 4 limbs which required bilateral BKA's and bilateral TM-C amputations.              -continue therapies  -team conference today 2.  RV thrombus/Anticoagulation: Pharmaceutical: Coumadin and Heparin 3. Pain Management: Prn medications effective.  4. Mood: Motivated to get stronger. LCSW to follow for evaluation and support.  5. Neuropsych: This patient is capable of making decisions on his own behalf. 6. Skin/Wound Care: Monitor wounds for healing.  Continue dressing changes to scrotum as needed             -urology contacted regarding scrotal drainage  -pink tape for trach stoma 7. Fluids/Electrolytes/Nutrition: Monitor intake with I/O. Add supplement to promote  healing.  8. CAD s/p AVR: continue IV heparin till INR therapeutic.On ASA, Atenolol and Lipitor.  9. A fib: Monitor HR bid. Continue digoxin, atenolol and amiodarone.  10. B-BKA, Left transmetatarsal and right MCP amputations due to DIC:               - santyl dressing BLE  -continue other dressing changes as we've been doing  11. 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  12. Diarrhea: generally mproving on lomotil prn 13. Anemia of chronic illness: Slowly improving --will continue to monitor for trends.  14. T2DM: Monitor BS ac/hs. Continue lantus insulin and titrate as indicated.   -SSI 15. HTN: Monitor BP bid--on lisinopril, lasix, digoxin and atenolol  16. Uro: wound care as above, urology follow up  -emptying well so far with low pvr's  LOS (Days) 4 A FACE TO FACE EVALUATION WAS PERFORMED  Alger Simons T, MD 02/09/2017 8:25 AM

## 2017-02-09 NOTE — Progress Notes (Signed)
Speech Language Pathology Daily Session Note  Patient Details  Name: Scott [name] MRN: [id number] Date of Birth: 1946-11-11  Today's Date: 02/09/2017 SLP Individual Time: 1330-1400 SLP Individual Time Calculation (min): 30 min  Short Term Goals: Week 1: SLP Short Term Goal 1 (Week 1): Patient will consume current diet without overt s/s of aspiration and supervision verbal cues for use of swallowing compensotry strategies.  SLP Short Term Goal 2 (Week 1): Patient will apply pressure to stoma during verbal expression to maximize vocal quality and overall speech intelligibility at the conversation level to 100% with supervision verbal cues.  SLP Short Term Goal 3 (Week 1): Patient will recall new, daily information with utilization of memory compensatory strategies and external aids with Min A verbal and visual cues.   Skilled Therapeutic Interventions: Skilled treatment session focused cognition goals. SLP facilitated session by providing compensatory memory strategies handout. Pt able to read and demonstrate comprehension. Pt able to recall pressure relief schedule and give instructions to SLP with clarity.  Pt with moderate air escaping during vocal production around gaze and pt with difficulty providing pressure to stoma site given amputation. SLP sought nursing to change bandage to a pressure bandage that would completely enclose stoma site. Pt left upright in wheelchair with wife and nursing present. All needs within reach. Continue per current plan of care.      Function:    Cognition Comprehension Comprehension assist level: Understands complex 90% of the time/cues 10% of the time  Expression   Expression assist level: Expresses complex 90% of the time/cues < 10% of the time  Social Interaction Social Interaction assist level: Interacts appropriately with others - No medications needed.  Problem Solving Problem solving assist level: Solves basic problems with no assist  Memory  Memory assist level: Recognizes or recalls 90% of the time/requires cueing < 10% of the time    Pain Pain Assessment Pain Score: 0-No pain  Therapy/Group: Individual Therapy  Darlene Bartelt B. Rutherford Nail, M.S., CCC-SLP Speech-Language Pathologist  [name] Rutherford Nail 02/09/2017, 2:12 PM

## 2017-02-09 NOTE — Progress Notes (Signed)
Occupational Therapy Note  Patient Details  Name: Scott [name] MRN: [id number] Date of Birth: 01/10/1946  Today's Date: 02/09/2017 OT Individual Time: 1000-1100 OT Individual Time Calculation (min): 60 min   Pt denied pain Individual Therapy   Pt resting in TIS upon arrival.  Pt requested use of BSC for bowel movement.  Pt performed sliding board transfer with tot A + 2 (pt=50%).  Pt performed lateral leans with min a to doff pants and brief.  Pt maintained sitting balance with BUE support on BSC.  Pt required tot A for hygiene and pulling up pants.  Pt was able to perform lateral leans with min A for clothing management.  Pt required mod A for sliding board transfer back to w/c.  Pt repositioned in w/c without assistance.  Pt remained in w/c with all needs within reach and daughter present.  Leotis Shames [facility] 02/09/2017, 12:05 PM

## 2017-02-09 NOTE — Progress Notes (Signed)
Occupational Therapy Session Note  Patient Details  Name: Scott [name] MRN: [id number] Date of Birth: 03-11-1946  Today's Date: 02/09/2017 OT Individual Time: 1430-1511 OT Individual Time Calculation (min): 41 min    Short Term Goals: Week 1:  OT Short Term Goal 1 (Week 1): Pt will complete bathing with Min A and use of bath mits OT Short Term Goal 2 (Week 1): Pt will complete toilet transfer with LRAD and Max A OT Short Term Goal 3 (Week 1): Pt will maintain dynamic sitting balance at EOB with supervision during ADLs OT Short Term Goal 4 (Week 1): Pt will complete self feeding with AE and setup  Skilled Therapeutic Interventions/Progress Updates:    Pt seen for OT session focusing on functional transfer w/c<> drop arm BSC. Pt received in supine upon arrival with hand off from PT. Pt voicing increased fatigue, however, wanting to complete tx session. Pt's wife present throughout session, assisted with steadying of equipment throughout session.  Pt completed Beasy board transfer EOB> drop arm BSC.  With steadying assist, pt able to complete lateral leans to pull pants down, increased assist to pull pants up. Used standard sliding board to return to w/c as unable to get pt on moveable plate of Beasy board and stabilize equipment. Total A to return to bed. Pt left in supine at end of session, wife present and neuropsychologist entering room. Educated throughout regarding DME, scheduled d/c date, OT/PT goals, POC, follow up therapy, and d/c planning.   Therapy Documentation Precautions:  Precautions Precautions: Fall, Other (comment) Precaution Comments: Left UE transmetacarpal amputation, R UE MCP amputation, bilateral BKA, G tube Restrictions Weight Bearing Restrictions: Yes Pain:   No/ denies pain ADL: ADL ADL Comments: Please see functional navigator for ADL status  See Function Navigator for Current Functional Status.   Therapy/Group: Individual Therapy  Lewis, Leila Schuff  C 02/09/2017, 3:30 PM

## 2017-02-09 NOTE — Progress Notes (Signed)
Occupational Therapy Session Note  Patient Details  Name: Scott [name] MRN: [id number] Date of Birth: [birth year]  Today's Date: 02/09/2017 OT Individual Time: 9678-9381 OT Individual Time Calculation (min): 45 min    Short Term Goals: Week 1:  OT Short Term Goal 1 (Week 1): Pt will complete bathing with Min A and use of bath mits OT Short Term Goal 2 (Week 1): Pt will complete toilet transfer with LRAD and Max A OT Short Term Goal 3 (Week 1): Pt will maintain dynamic sitting balance at EOB with supervision during ADLs OT Short Term Goal 4 (Week 1): Pt will complete self feeding with AE and setup  Skilled Therapeutic Interventions/Progress Updates: 1:1 Self care retraining at bed level. Focus on ability to come into sitting from supported upright sitting and supine with lower bed rails. Able to complete with extra time with min A. Pt able to dress UB in unsupported long sitting with close supervision. Alternating long sitting to supine for threading and pulling up pants; pt able to complete with setup / organization of clothing and more than reasonable amt of time- pt able to use adaptive ways to manipulate clothing. Pt able to pivot self in long sitting to EOB with steadying A. Performed [employer] board transfer with mod A with tactile and verbal cues for hip/ head relationship. Transitioned to the sink and pt able to brush teeth with u-cuff with setup with extra time. Assisted pt with shaving for time.  Left in tilt in space w/c to rest - waiting for RN to redress dressings.      Therapy Documentation Precautions:  Precautions Precautions: Fall, Other (comment) Precaution Comments: Left UE transmetacarpal amputation, R UE MCP amputation, bilateral BKA, G tube Restrictions Weight Bearing Restrictions: Yes    Pain: Pain Assessment Pain Score: 0-No pain  See Function Navigator for Current Functional Status.   Therapy/Group: Individual Therapy  Willeen Cass [facility] 02/09/2017, 2:14  PM

## 2017-02-09 NOTE — Progress Notes (Signed)
Physical Therapy Session Note  Patient Details  Name: CASTIN DONAGHUE MRN: [id number] Date of Birth: [birth year]  Today's Date: 02/09/2017 PT Individual Time: 1400-1430 PT Individual Time Calculation (min): 30 min   Short Term Goals: Week 1:  PT Short Term Goal 1 (Week 1): Patient will maintain sitting balance with supervision assist  PT Short Term Goal 2 (Week 1): patient will perform WC<>bed transfer with max assist consistently PT Short Term Goal 3 (Week 1): Patient will perform bed mobility with min assist form PT.  PT Short Term Goal 4 (Week 1): Patient will iniate WC proplusion.   Skilled Therapeutic Interventions/Progress Updates:    no c/o pain.  Session focus on transfers with beezy board, sitting balance, and LE strengthening.   Pt transfers w/c>bed with beezy board, pt performs lateral lean with supervision and requires mod assist to lift hips onto sliding disc.  Lateral scoot with mod assist for balance and uphill progression.  Pt demos static sitting balance with wide BOS and BUE support x5 minutes with min verbal cues for shifting weight forward to find balance point.  Sit>supine with supervision and pt completed 2x8 reps SLR and side lying hip extension bilaterally with mod tactile cues for positioning.  Handoff to OT at end of session.   Therapy Documentation Precautions:  Precautions Precautions: Fall, Other (comment) Precaution Comments: Left UE transmetacarpal amputation, R UE MCP amputation, bilateral BKA, G tube Restrictions Weight Bearing Restrictions: Yes   See Function Navigator for Current Functional Status.   Therapy/Group: Individual Therapy  Earnest Conroy Penven-Crew 02/09/2017, 2:35 PM

## 2017-02-09 NOTE — Progress Notes (Signed)
Discussed patient with Dr. Louis Meckel. He recommends packing wound with kerlix 2-3 times a day and keeping area dry. Discussed antibiotic regimen--patient has completed 6 day course of Zosyn and now on cipro. He recommends 14 day total of antibiotic therapy. Follow wound for healing and he'll have Dr. Karsten Ro follow up for post op check.

## 2017-02-09 NOTE — Consult Note (Signed)
Neuropsychological Consultation   Patient:   Scott [name]   DOB:   02/05/1946  MR Number:  [id number]  Location:  [place] [facility] [address] [id number] [place] [address] Dept: [phone] Loc: [phone]           Date of Service:   02/09/2017  Start Time:   3 PM End Time:   4 PM  Provider/Observer:          Billing Code/Service: [id number]  Chief Complaint:    No chief complaint on file.   Reason for Service:  From Medical Records:  Scott [name] a 71 year old male with history of T2DM, heart murmur, BPH, RLS, peyronie's disease, bicuspid aortic valve with severe stenosi who was originally admitted to [facility] on today 10/20/16 for cardiac cath to work up 2 month history of chest pain with SOB. He was found to have 3 vessel CAD with borderline critical AS and thoracic ascending aneurysm.   He was taken to OR on 10/22/16 for CABG X 3, AVR, MVR, repair of thoracic ascending aneurysm with placement of VAC on open chest. Post op course significant for cardiogenic shock requiring multiple pressors, SAM requiring removal of mitral annuloplasty ring,  persistent fevers, encephalopathy, shocked liver, DIC due to consumptive intraoperative coagulopathy, diffuse clotting with ischemia of distal BUE/BLE extremities, A fib, C diff colitis, volume overload and VDRF with difficulty with vent wean requiring tracheostomy.  Sternal wound closed on 12/4 and was started on coumadin for RV thrombus and A fib.    He was transferred to [facility] on 12/26 for vent wean.  Hospital course significant for MRSA bacteremia, pneumonitis, intermittent fevers, dysphagia requiring PEG tube placement on 1/15 by Dr. Kathlene Cote. He developed progressive gangrenous changes of BUE and BLE requiring B-transtibial amputation , left transmetacarpal amputation and Right MCP amputation of right hand by Dr. Sharol Given on 1/19.  Has been showing improvement in  respiratory status and endurance. He tolerated extubation to ATC and was de-cannulated on 3/12. Tube feeds have been discontinued and diet was been gradually advanced to regular with chopped meats by 3/14.  He developed phimosis with erosion of penile prothesis and purulent drainage and despite antibiotics. Scrotal wound positive for few pseudomonas aeruginosa and moderate bacteroides fragilis therefore antibiotics changed to Zosyn X 1 week. Dr. Karsten Ro consulted for input  and recommended removal of prosthesis. He has had poor healing of right index MCP. Once INR normalized, he underwent removal of penile prosthesis by Dr. Karsten Ro and amputation of index MC.   Reason for Neuropsychologcial Consultation:  The patient was referred for consultation due to concerns about coping with the multiple sudden amputations.  The patient's wife reports that he was becoming aware of issues with his fingers (turning black) after the initial surgery but that he was not really aware of the extent of issues with legs.  He reports that once he was aware his "legs and fingers were gone/"  Reports that he was depressed with feelings of loss for first couple weeks, but that he is adjusting more now.  Current Status:  Patient reports that he is coping better this week but still is having difficulty seeing how he will be able to function with the lack of legs and fingers.  Behavioral Observation: Scott [name]  presents as a 71 y.o.-year-old Right Caucasian Male who appeared his stated age. his dress was Appropriate and he was Well Groomed and his  manners were Appropriate to the situation.  his participation was indicative of Appropriate and Attentive behaviors.  There were physical disabilities noted, loss of legs and fingers.  he displayed an appropriate level of cooperation and motivation.     Interactions:    Active Appropriate and Attentive  Attention:   within normal limits and attention span and concentration were  age appropriate  Memory:   within normal limits; recent and remote memory intact  Visuo-spatial:  within normal limits  Speech (Volume):  low  Speech:   normal; normal  Thought Process:  Coherent  Though Content:  WNL; not suicidal  Orientation:   person, place, time/date and situation  Judgment:   Fair  Planning:   Fair  Affect:    Depressed  Mood:    Depressed  Insight:   Good  Intelligence:   normal  Current Employment: Retired  Past Employment:  Worked for [employer] History:   Past Medical History:  Diagnosis Date  . Arthritis    "hips, shoulders; knees; back" (10/20/2016)  . Bicuspid aortic valve   . BPH (benign prostatic hypertrophy)   . Carpal tunnel syndrome of right wrist   . Coronary artery disease involving native coronary artery of native heart with unstable angina pectoris ([place]) 10/20/2016  . Diverticulitis   . GERD (gastroesophageal reflux disease)   . Gout   . Heart murmur   . Hyperlipemia   . Hypertension   . Postoperative atrial fibrillation ([place]) 10/24/2016  . RLS (restless legs syndrome)   . S/P aortic valve replacement with bioprosthetic valve 10/22/2016   25 mm [facility] Ease bovine pericardial bioprosthetic tissue valve  . S/P ascending aortic aneurysm repair 10/22/2016   28 mm supracoronary straight graft replacement of ascending thoracic aortic aneurysm  . S/P CABG x 3 10/22/2016   Sequential LIMA to Diag and LAD, SVG to distal LAD, open vein harvest right thigh  . S/P mitral valve repair 10/22/2016   Artificial Gore-tex neochord placement x6 - posterior annuloplasty band placed but removed due to systolic anterior motion of mitral valve  . Severe aortic stenosis   . Snores    Never been tested for sleep apnea  . Thoracic ascending aortic aneurysm ([place]) 10/21/2016  . Thrombocytopenia ([place]) 10/22/2016  . Type II diabetes mellitus ([place])          Sexual History:   History  Sexual Activity  . Sexual activity:  Not Currently    Family Med/Psych History:  Family History  Problem Relation Age of Onset  . Lung cancer Mother   . Clotting disorder Father     No details  . Heart disease Sister 19    Stents  . Cancer Sister     Throat    Risk of Suicide/Violence: low Patient denies SI or HI  Impression/DX:  The patient is having issues with coping after loss Of multiple limbs. The patient reports that he initially had a depressive response when he became fully aware of the extent of physical loss he had. However, as time goes on he is becoming more adjusted to what is been happening to him. The patient is dealing with a lot of loss in his life and worried about how this will affect his relationship with his wife and what is going to happen to him in the future.  Disposition/Plan:  We worked on therapeutic issues and I made clear to the patient that if over the next couple of days that he felt  the need to have further discussions that I would be of available if he wanted.          Electronically Signed   _______________________ Ilean Skill, Psy.D.

## 2017-02-09 NOTE — Progress Notes (Signed)
Physical Therapy Session Note  Patient Details  Name: Scott [name] MRN: [id number] Date of Birth: 1946-01-07  Today's Date: 02/09/2017 PT Individual Time: 1300-1330 PT Individual Time Calculation (min): 30 min   Short Term Goals: Week 1:  PT Short Term Goal 1 (Week 1): Patient will maintain sitting balance with supervision assist  PT Short Term Goal 2 (Week 1): patient will perform WC<>bed transfer with max assist consistently PT Short Term Goal 3 (Week 1): Patient will perform bed mobility with min assist form PT.  PT Short Term Goal 4 (Week 1): Patient will iniate WC proplusion.   Skilled Therapeutic Interventions/Progress Updates: Pt received supine in bed on bedpan; denies pain and agreeable to treatment. +2A with RN for rolling R/L to perform hygiene and clothing management. Pt attempted to pull up pants in semi-reclined position however ultimately required maxA for pulling pants over hips d/t lack of grasp in LUE and RUE thumb. Supine>sit modA with bedrails. Transfer bed>w/c with transfer board and maxA. Wife performed transfer w/c >bed with transfer board and minA from therapist, mod cues for technique, head/hips relationship. Discussed d/c plan with pt and wife, reviewed w/c options and therapist's plan to follow up with CSW and ATP specialist. Pt requested timer prior to therapist leaving; able to direct wife in how pressure relief is performed. Remained seated in w/c with wife present and all needs in reach at end of session.      Therapy Documentation Precautions:  Precautions Precautions: Fall, Other (comment) Precaution Comments: Left UE transmetacarpal amputation, R UE MCP amputation, bilateral BKA, G tube Restrictions Weight Bearing Restrictions: Yes   See Function Navigator for Current Functional Status.   Therapy/Group: Individual Therapy  Luberta Mutter 02/09/2017, 3:36 PM

## 2017-02-09 NOTE — Progress Notes (Signed)
ANTICOAGULATION CONSULT NOTE - Follow Up Consult  Pharmacy Consult for coumadin Indication: afib / RV thrombus  Allergies  Allergen Reactions  . Doxycycline Hives  . Penicillins Hives     Has patient had a PCN reaction causing immediate rash, facial/tongue/throat swelling, SOB or lightheadedness with hypotension: No Has patient had a PCN reaction causing severe rash involving mucus membranes or skin necrosis: No Has patient had a PCN reaction that required hospitalization: No Has patient had a PCN reaction occurring within the last 10 years:# # # YES # # #  If all of the above answers are "NO", then may proceed with Cephalosporin use.   Ignacia Bayley Antibiotics Hives    Patient Measurements: Weight: 157 lb (71.2 kg) Heparin Dosing Weight:   Vital Signs: Temp: 98.2 F (36.8 C) (03/20 0511) Temp Source: Oral (03/20 0511) BP: 106/68 (03/20 0511) Pulse Rate: 80 (03/20 0511)  Labs:  Recent Labs  02/07/17 0729 02/08/17 0600 02/09/17 0642  LABPROT 24.2* 24.1* 28.2*  INR 2.13 2.12 2.59  CREATININE 0.76  --   --     Estimated Creatinine Clearance: 85.9 mL/min (by C-G formula based on SCr of 0.76 mg/dL).   Medications:  Scheduled:  . amiodarone  100 mg Oral Daily  . aspirin EC  81 mg Oral Daily  . atenolol  25 mg Oral Daily  . atorvastatin  40 mg Oral q1800  . Chlorhexidine Gluconate Cloth  6 each Topical Q0600  . ciprofloxacin  500 mg Oral BID  . collagenase   Topical Daily  . digoxin  0.125 mg Oral Daily  . famotidine  20 mg Oral BID  . ferrous sulfate  325 mg Oral BID WC  . furosemide  40 mg Oral BID  . gabapentin  300 mg Oral BID  . hydrocerin   Topical BID  . insulin aspart  0-5 Units Subcutaneous QHS  . insulin aspart  0-9 Units Subcutaneous TID WC  . insulin glargine  5 Units Subcutaneous BID  . lisinopril  2.5 mg Oral Daily  . Melatonin  3 mg Oral QHS  . mupirocin ointment  1 application Nasal BID  . potassium chloride  20 mEq Oral Daily  . rOPINIRole  2 mg  Oral QHS  . sertraline  75 mg Oral Daily  . vitamin C  500 mg Oral Daily  . warfarin  5 mg Oral q1800  . Warfarin - Pharmacist Dosing Inpatient   Does not apply q1800  . zinc sulfate  220 mg Oral BID  . zolpidem  5 mg Oral QHS   Infusions:    Assessment: 71 yo male with afib and RV thrombus is currently on therapeutic coumadin.  INR today is 2.59.  Goal of Therapy:  INR 2-3 Monitor platelets by anticoagulation protocol: Yes   Plan:  - continue coumadin 5mg  po daily - INR in am  Bricyn Labrada, Tsz-Yin 02/09/2017,8:17 AM

## 2017-02-10 ENCOUNTER — Inpatient Hospital Stay (HOSPITAL_COMMUNITY): Payer: Medicare Other | Admitting: Physical Therapy

## 2017-02-10 ENCOUNTER — Inpatient Hospital Stay (HOSPITAL_COMMUNITY): Payer: Medicare Other | Admitting: Occupational Therapy

## 2017-02-10 ENCOUNTER — Inpatient Hospital Stay (HOSPITAL_COMMUNITY): Payer: Medicare Other | Admitting: Speech Pathology

## 2017-02-10 LAB — PROTIME-INR
INR: 3.53
Prothrombin Time: 36.2 seconds — ABNORMAL HIGH (ref 11.4–15.2)

## 2017-02-10 LAB — GLUCOSE, CAPILLARY
GLUCOSE-CAPILLARY: 148 mg/dL — AB (ref 65–99)
GLUCOSE-CAPILLARY: 128 mg/dL — AB (ref 65–99)
Glucose-Capillary: 122 mg/dL — ABNORMAL HIGH (ref 65–99)
Glucose-Capillary: 122 mg/dL — ABNORMAL HIGH (ref 65–99)

## 2017-02-10 NOTE — Progress Notes (Signed)
PHYSICAL MEDICINE & REHABILITATION     PROGRESS NOTE    Subjective/Complaints: Happy with progress in therapy. Very motivated. Asked if PEG can be removed.   ROS: pt denies nausea, vomiting, diarrhea, cough, shortness of breath or chest pain    Objective: Vital Signs: Blood pressure 98/68, pulse 80, temperature 98.2 F (36.8 C), temperature source Oral, resp. rate 18, weight 71.7 kg (158 lb), SpO2 97 %. No results found. No results for input(s): WBC, HGB, HCT, PLT in the last 72 hours. No results for input(s): NA, K, CL, GLUCOSE, BUN, CREATININE, CALCIUM in the last 72 hours.  Invalid input(s): CO CBG (last 3)   Recent Labs  02/09/17 2052 02/10/17 0650 02/10/17 1150  GLUCAP 141* 148* 122*    Wt Readings from Last 3 Encounters:  02/10/17 71.7 kg (158 lb)  01/31/17 101 kg (222 lb 10.6 oz)  02/04/17 101 kg (222 lb 10.6 oz)    Physical Exam:  Constitutional: He is oriented to person, place, and time. He appears well-developed and well-nourished.  No distress  HENT:  Head: Normocephalic and atraumatic.  Eyes: Conjunctivae are normal. Pupils are equal, round, and reactive to light.  Neck: Normal range of motion. Neck supple.  Stoma still open with leakage where dressing not secured  Cardiovascular: RRR.   Respiratory: CTA.    GI: Soft. Bowel sounds are normal. He exhibits no distension. There is no tenderness.  PEG site clean Genitourinary:  Genitourinary Comments: scrotal wound packed  Musculoskeletal:  B-BKA incisions-left with dehisced edge medially and right with dehisced edge laterally which is more involved and necrotic--essentially unchanged  Right MCP site with incision intact and sutures in place, dry, dressed  Multiple dry eschars on bilateral residual tibial and knees, right inner thigh at graft site. Healing abrasion medial thigh.   Neurological: He is alert and oriented to person, place, and time. Coordination abnormal.  Speech clear.  Reasonable insight and awareness. UE limited by discomfort to an extent. B/L deltoid, biceps, triceps are grossly 3+/5. Wrist and fingers limited by splints and amputations. LE: 2/5 HF, 3/5 KE.   Skin: Scrotal wound with drainage from drain site buttocks reddened, scarred.    Psychiatric: pleasant and appropriate    Assessment/Plan: 1. Debility and mobility/functional deficits secondary to multiple medical issues, 4 limb amputations which require 3+ hours per day of interdisciplinary therapy in a comprehensive inpatient rehab setting. Physiatrist is providing close team supervision and 24 hour management of active medical problems listed below. Physiatrist and rehab team continue to assess barriers to discharge/monitor patient progress toward functional and medical goals.  Function:  Bathing Bathing position   Position: Bed  Bathing parts Body parts bathed by patient: Right arm, Left arm, Chest, Abdomen, Right upper leg, Left upper leg Body parts bathed by helper: Front perineal area, Buttocks, Back  Bathing assist Assist Level: Touching or steadying assistance(Pt > 75%)      Upper Body Dressing/Undressing Upper body dressing   What is the patient wearing?: Pull over shirt/dress     Pull over shirt/dress - Perfomed by patient: Thread/unthread right sleeve, Thread/unthread left sleeve, Put head through opening, Pull shirt over trunk Pull over shirt/dress - Perfomed by helper: Pull shirt over trunk, Put head through opening        Upper body assist Assist Level: Touching or steadying assistance(Pt > 75%)      Lower Body Dressing/Undressing Lower body dressing   What is the patient wearing?: Pants   Underwear - Performed  by helper: Thread/unthread right underwear leg, Thread/unthread left underwear leg, Pull underwear up/down Pants- Performed by patient: Thread/unthread right pants leg, Thread/unthread left pants leg, Pull pants up/down Pants- Performed by helper:  Thread/unthread right pants leg, Thread/unthread left pants leg, Pull pants up/down                      Lower body assist Assist for lower body dressing: Touching or steadying assistance (Pt > 75%)      Toileting Toileting Toileting activity did not occur: No continent bowel/bladder event Toileting steps completed by patient: Adjust clothing prior to toileting Toileting steps completed by helper: Performs perineal hygiene, Adjust clothing after toileting    Toileting assist Assist level: Two helpers   Transfers Chair/bed transfer   Chair/bed transfer method: Lateral scoot Chair/bed transfer assist level: Maximal assist (Pt 25 - 49%/lift and lower) Chair/bed transfer assistive device: Sliding board, Armrests     Locomotion Ambulation           Wheelchair     Max wheelchair distance: 167ft  Assist Level: Dependent (Pt equals 0%) (in TIS WC )  Cognition Comprehension Comprehension assist level: Understands complex 90% of the time/cues 10% of the time  Expression Expression assist level: Expresses complex 90% of the time/cues < 10% of the time  Social Interaction Social Interaction assist level: Interacts appropriately with others - No medications needed.  Problem Solving Problem solving assist level: Solves basic problems with no assist  Memory Memory assist level: Recognizes or recalls 90% of the time/requires cueing < 10% of the time   Medical Problem List and Plan: 1.  Deconditioning and functional/mobility deficits secondary to complications after CABG/AVR with subsequent respiratory failure as well as ischemic injury to all 4 limbs which required bilateral BKA's and bilateral TM-C amputations.              -continue therapies  -remains very motivated 2.  RV thrombus/Anticoagulation: Pharmaceutical: Coumadin and Heparin 3. Pain Management: Prn medications effective.  4. Mood: Motivated to get stronger. LCSW to follow for evaluation and support.  5. Neuropsych:  This patient is capable of making decisions on his own behalf. 6. Skin/Wound Care: Monitor wounds for healing.     -pink tape for trach stoma (to cover entirety of dressing!) 7. Fluids/Electrolytes/Nutrition: good po intake  -can remove PEG in am  8. CAD s/p AVR: continue IV heparin till INR therapeutic.On ASA, Atenolol and Lipitor.  9. A fib: Monitor HR bid. Continue digoxin, atenolol and amiodarone.  10. B-BKA, Left transmetatarsal and right MCP amputations due to DIC:               - santyl dressing BLE  -continue other dressing changes as we've been doing --consider debridement 11. 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   -dressing changes per urology 12. Diarrhea: generally mproving on lomotil prn 13. Anemia of chronic illness: Slowly improving --will continue to monitor for trends.  14. T2DM: Monitor BS ac/hs. Continue lantus insulin and titrate as indicated.   -SSI 15. HTN: Monitor BP bid--on lisinopril, lasix, digoxin and atenolol  16. Uro: wound care as above  -emptying well so far with low pvr's  LOS (Days) 5 A FACE TO FACE EVALUATION WAS PERFORMED  Meredith Staggers, MD 02/10/2017 12:46 PM

## 2017-02-10 NOTE — Progress Notes (Signed)
Assessment: Status post removal of eroded inflatable penile prosthesis - the penis is healing up nicely where the cylinders had extruded at the level of the fossa navicularis. There is no discharge or sign of infection and the penis has no palpable tenderness or fluctuance. The scrotum is healing nicely as well. The location where his pump had been exposed was excised and closed primarily and is healing up nicely. His midline incision looks good and he has been undergoing dressing changes which appear to be resulting in resolution of drainage and slow, secondary closure of his wound. Overall he is shown marked clinical improvement and is healing appropriately.  I told him that I would like to see him back at my office once he's been discharged which looks like it is going to be in about a week. Until then continue dressing changes will be necessary until his wound has completely healed.  Plan: He will follow-up with me as an outpatient. I have placed follow-up instructions on the chart to be given to him upon discharge.    Subjective: Patient reports no urologic complaints. He reports that after his catheter was removed he was voiding without difficulty and continues to do so. He said his scrotum and penis feel much better now that the prosthesis has been removed. He is not experiencing any significant pain other than some discomfort with dressing changes as anticipated.  Objective: Vital signs in last 24 hours: Temp:  [98.2 F (36.8 C)-98.3 F (36.8 C)] 98.2 F (36.8 C) (03/21 0448) Pulse Rate:  [80-85] 80 (03/21 0448) Resp:  [18] 18 (03/21 0448) BP: (98-110)/(68-75) 98/68 (03/21 0448) SpO2:  [97 %-100 %] 97 % (03/21 0448) Weight:  [158 lb (71.7 kg)] 158 lb (71.7 kg) (03/21 0448)A  Intake/Output from previous day: 03/20 0701 - 03/21 0700 In: 720 [P.O.:720] Out: -  Intake/Output this shift: No intake/output data recorded.  Past Medical History:  Diagnosis Date  . Arthritis    "hips, shoulders; knees; back" (10/20/2016)  . Bicuspid aortic valve   . BPH (benign prostatic hypertrophy)   . Carpal tunnel syndrome of right wrist   . Coronary artery disease involving native coronary artery of native heart with unstable angina pectoris ([place]) 10/20/2016  . Diverticulitis   . GERD (gastroesophageal reflux disease)   . Gout   . Heart murmur   . Hyperlipemia   . Hypertension   . Postoperative atrial fibrillation ([place]) 10/24/2016  . RLS (restless legs syndrome)   . S/P aortic valve replacement with bioprosthetic valve 10/22/2016   25 mm [facility] Ease bovine pericardial bioprosthetic tissue valve  . S/P ascending aortic aneurysm repair 10/22/2016   28 mm supracoronary straight graft replacement of ascending thoracic aortic aneurysm  . S/P CABG x 3 10/22/2016   Sequential LIMA to Diag and LAD, SVG to distal LAD, open vein harvest right thigh  . S/P mitral valve repair 10/22/2016   Artificial Gore-tex neochord placement x6 - posterior annuloplasty band placed but removed due to systolic anterior motion of mitral valve  . Severe aortic stenosis   . Snores    Never been tested for sleep apnea  . Thoracic ascending aortic aneurysm ([place]) 10/21/2016  . Thrombocytopenia ([place]) 10/22/2016  . Type II diabetes mellitus ([facility])     Physical Exam:  Lungs - Normal respiratory effort, chest expands symmetrically.  Abdomen - Soft, non-tender & non-distended. Genitalia - phallus reveals healing prosthesis extrusion sites at the urethral meatus with no discharge or erythema. His scrotum has no  sign of erythema or crepitus. The site of his pump erosion is healing nicely and his midline incision is intact with packing in place. No purulent discharge or odor noted.   Lab Results: No results for input(s): WBC, HGB, HCT in the last 72 hours. BMET No results for input(s): NA, K, CL, CO2, GLUCOSE, BUN, CREATININE, CALCIUM in the last 72 hours. No results for input(s): LABURIN in the last  72 hours. Results for orders placed or performed during the hospital encounter of 02/05/17  C difficile quick scan w PCR reflex     Status: None   Collection Time: 02/05/17  5:05 PM  Result Value Ref Range Status   C Diff antigen NEGATIVE NEGATIVE Final   C Diff toxin NEGATIVE NEGATIVE Final   C Diff interpretation No C. difficile detected.  Final  MRSA PCR Screening     Status: Abnormal   Collection Time: 02/05/17  5:11 PM  Result Value Ref Range Status   MRSA by PCR POSITIVE (A) NEGATIVE Final    Comment:        The GeneXpert MRSA Assay (FDA approved for NASAL specimens only), is one component of a comprehensive MRSA colonization surveillance program. It is not intended to diagnose MRSA infection nor to guide or monitor treatment for MRSA infections. RESULT CALLED TO, READ BACK BY AND VERIFIED WITH: Mendel Ryder RN [id number] 02/05/17 A BROWNING     Studies/Results: No results found.    Bashar Milam C 02/10/2017, 10:57 AM

## 2017-02-10 NOTE — Progress Notes (Signed)
Physical Therapy Session Note  Patient Details  Name: Scott [name] MRN: [id number] Date of Birth: 1946/05/17  Today's Date: 02/10/2017 PT Individual Time: 1000-1100 PT Individual Time Calculation (min): 60 min   Short Term Goals: Week 1:  PT Short Term Goal 1 (Week 1): Patient will maintain sitting balance with supervision assist  PT Short Term Goal 2 (Week 1): patient will perform WC<>bed transfer with max assist consistently PT Short Term Goal 3 (Week 1): Patient will perform bed mobility with min assist form PT.  PT Short Term Goal 4 (Week 1): Patient will iniate WC proplusion.   Skilled Therapeutic Interventions/Progress Updates: Pt received supine in bed, denies pain and agreeable to treatment. Supine>sit modA with HOB elevated and increased time. Transfer bed>w/c with beasy board and mod/maxA. Transfer w/c <>mat table with beasy board and table oriented to allow slight downhill during transfer and pt able to perform with minA/min guard, improving trunk coordination and weight shifting. Short sit >long sit with S; performed hamstring stretch x3 min. Long sit >supine with min guard and cues for technique to walk down on elbows>back. Supine PROM to hip flexors and UEs extended at 90 degrees for pec stretch; educated pt on importance of lying flat to counter effects of sitting in reclined bed and w/c all day. Rolling R/L x3 each direction with S and increased time, cues for technique to increase momentum and reduce energy expenditure. Supine>sit modA from R sidelying. Transfer to return to w/c as above. Discussed with pt possibility of getting hospital bed to allow increased ease of transfers, reduce caregiver burden. Plan to provide family with home measurement sheet, and will continue discussion with family to determine optimal setup at home prior to d/c.  Remained seated in w/c at end of session, RN present to adjust bandage covering stoma, and all needs in reach.      Therapy  Documentation Precautions:  Precautions Precautions: Fall, Other (comment) Precaution Comments: Left UE transmetacarpal amputation, R UE MCP amputation, bilateral BKA, G tube Restrictions Weight Bearing Restrictions: Yes   See Function Navigator for Current Functional Status.   Therapy/Group: Individual Therapy  Luberta Mutter 02/10/2017, 4:13 PM

## 2017-02-10 NOTE — Progress Notes (Signed)
ANTICOAGULATION CONSULT NOTE - Follow Up Consult  Pharmacy Consult for coumadin Indication: afib/ RV thrombus  Allergies  Allergen Reactions  . Doxycycline Hives  . Penicillins Hives     Has patient had a PCN reaction causing immediate rash, facial/tongue/throat swelling, SOB or lightheadedness with hypotension: No Has patient had a PCN reaction causing severe rash involving mucus membranes or skin necrosis: No Has patient had a PCN reaction that required hospitalization: No Has patient had a PCN reaction occurring within the last 10 years:# # # YES # # #  If all of the above answers are "NO", then may proceed with Cephalosporin use.   Scott [name] Antibiotics Hives    Patient Measurements: Weight: 158 lb (71.7 kg) Heparin Dosing Weight:   Vital Signs: Temp: 98.2 F (36.8 C) (03/21 0448) Temp Source: Oral (03/21 0448) BP: 98/68 (03/21 0448) Pulse Rate: 80 (03/21 0448)  Labs:  Recent Labs  02/08/17 0600 02/09/17 0642  LABPROT 24.1* 28.2*  INR 2.12 2.59    Estimated Creatinine Clearance: 85.9 mL/min (by C-G formula based on SCr of 0.76 mg/dL).   Medications:  Scheduled:  . amiodarone  100 mg Oral Daily  . aspirin EC  81 mg Oral Daily  . atenolol  25 mg Oral Daily  . atorvastatin  40 mg Oral q1800  . Chlorhexidine Gluconate Cloth  6 each Topical Q0600  . ciprofloxacin  500 mg Oral BID  . collagenase   Topical Daily  . digoxin  0.125 mg Oral Daily  . famotidine  20 mg Oral BID  . ferrous sulfate  325 mg Oral BID WC  . furosemide  40 mg Oral BID  . gabapentin  300 mg Oral BID  . hydrocerin   Topical BID  . insulin aspart  0-5 Units Subcutaneous QHS  . insulin aspart  0-9 Units Subcutaneous TID WC  . insulin glargine  5 Units Subcutaneous BID  . lisinopril  2.5 mg Oral Daily  . Melatonin  3 mg Oral QHS  . mupirocin ointment  1 application Nasal BID  . potassium chloride  20 mEq Oral Daily  . rOPINIRole  2 mg Oral QHS  . sertraline  75 mg Oral Daily  . vitamin C   500 mg Oral Daily  . warfarin  5 mg Oral q1800  . Warfarin - Pharmacist Dosing Inpatient   Does not apply q1800  . zinc sulfate  220 mg Oral BID  . zolpidem  5 mg Oral QHS   Infusions:    Assessment: 51 male with afib/ RV thrombus is currently on supratherapeutic coumadin.  INR today is 3.53.  Goal of Therapy:  INR 2-3 Monitor platelets by anticoagulation protocol: Yes   Plan: - Discontinue standing coumadin orders and hold dose tonight - INR in am  Scott [name], Tsz-Yin 02/10/2017,7:48 AM

## 2017-02-10 NOTE — Progress Notes (Signed)
Physical Therapy Session Note  Patient Details  Name: Scott [name] MRN: [id number] Date of Birth: 05/02/1946  Today's Date: 02/10/2017 PT Individual Time: 1000-1100 PT Individual Time Calculation (min): 60 min   Short Term Goals: Week 1:  PT Short Term Goal 1 (Week 1): Patient will maintain sitting balance with supervision assist  PT Short Term Goal 2 (Week 1): patient will perform WC<>bed transfer with max assist consistently PT Short Term Goal 3 (Week 1): Patient will perform bed mobility with min assist form PT.  PT Short Term Goal 4 (Week 1): Patient will iniate WC proplusion.   Skilled Therapeutic Interventions/Progress Updates:   Pt received supine in bed and  found to be asleep. Patient very difficult to arouse due to fatigue from previous treatment sessions. PT will re-attempt at later time/date.   Therapy Documentation Precautions:  Precautions Precautions: Fall, Other (comment) Precaution Comments: Left UE transmetacarpal amputation, R UE MCP amputation, bilateral BKA, G tube Restrictions Weight Bearing Restrictions: Yes General: PT Amount of Missed Time (min): 30 Minutes PT Missed Treatment Reason: Patient fatigue Vital Signs: Therapy Vitals Temp: 98.3 F (36.8 C) Temp Source: Oral Pulse Rate: 80 Resp: 18 BP: 105/70 Patient Position (if appropriate): Lying Oxygen Therapy SpO2: 98 % O2 Device: Not Delivered   See Function Navigator for Current Functional Status.   Therapy/Group: Individual Therapy  Lorie Phenix 02/10/2017, 5:30 PM

## 2017-02-10 NOTE — Progress Notes (Signed)
Occupational Therapy Session Note  Patient Details  Name: Scott [name] MRN: [id number] Date of Birth: 11-04-1946  Today's Date: 02/10/2017 OT Individual Time: 1300-1400 OT Individual Time Calculation (min): 60 min    Short Term Goals: Week 1:  OT Short Term Goal 1 (Week 1): Pt will complete bathing with Min A and use of bath mits OT Short Term Goal 2 (Week 1): Pt will complete toilet transfer with LRAD and Max A OT Short Term Goal 3 (Week 1): Pt will maintain dynamic sitting balance at EOB with supervision during ADLs OT Short Term Goal 4 (Week 1): Pt will complete self feeding with AE and setup  Skilled Therapeutic Interventions/Progress Updates:    Pt seen for OT session focusing on functional transfers and ADL re-training. Pt sitting up in w/c upon arrival, requested to return to supine as he stated he needed to use bedpan. Pt agreeable to transfer to padded tub bench with cut out for voiding needs ( found BSC to be very uncomfortable yesterday). Pt completed sliding board transfer (slightly downhill) to bench with min steadying assist following assist to place board. Lateral leans completed for clothing management, min A to pull pants entirely down for time management. Pt with continent BM. Lateral leans and total A for hygiene and clothing management. Pt attempted self hygiene, however, due to poor grasping abilities pt unable to manage washcloth. Increased assist required for sliding board back to chair due to slightly uphill transfer. He completed oral hygiene at sink with set-up assist using universal cuff. Pt demonstrates excellent problem solving and creative thinking abilities to complete ADL tasks with optimum independence. Pt requested return to supine at end of session, min A Beasy board transfer completed. Pt left in supine at end of session in prep for SLP session.  Educated and discussed throughout session regarding DME (padded tub bench, drop arm BSC, beasy board vs standard  sliding board, hospital bed, etc) and d/c planning.   Therapy Documentation Precautions:  Precautions Precautions: Fall, Other (comment) Precaution Comments: Left UE transmetacarpal amputation, R UE MCP amputation, bilateral BKA, G tube Restrictions Weight Bearing Restrictions: Yes Pain:   No/ denies pain ADL: ADL ADL Comments: Please see functional navigator for ADL status  See Function Navigator for Current Functional Status.   Therapy/Group: Individual Therapy  Lewis, Jazlyn Tippens C 02/10/2017, 7:12 AM

## 2017-02-10 NOTE — Progress Notes (Signed)
Speech Language Pathology Daily Session Note  Patient Details  Name: Scott [name] MRN: [id number] Date of Birth: 1945/12/17  Today's Date: 02/10/2017 SLP Individual Time: 1400-1500 SLP Individual Time Calculation (min): 60 min  Short Term Goals: Week 1: SLP Short Term Goal 1 (Week 1): Patient will consume current diet without overt s/s of aspiration and supervision verbal cues for use of swallowing compensotry strategies.  SLP Short Term Goal 2 (Week 1): Patient will apply pressure to stoma during verbal expression to maximize vocal quality and overall speech intelligibility at the conversation level to 100% with supervision verbal cues.  SLP Short Term Goal 3 (Week 1): Patient will recall new, daily information with utilization of memory compensatory strategies and external aids with Min A verbal and visual cues.   Skilled Therapeutic Interventions: Skilled treatment session focused on cognition goals. SLP facilitated session by providing Min A for completion of semi-complex novel card game. Pt able to recall new, daily information with Min A verbal cues. Pt able to apply pressure to bandage when needs (although his stoma site was more securely tapped to decrease air leakage). Pt with good speech intelligibility at ~ >90% accuracy at the sentence to simple conversation level. Overall, pt with decreased vocal intensity but didn't promote increase intensity d/t stoma site note healed. Pt able to recall pressure relief system with Min A verbal cues. Pt was left upright in bed, bed alarm on and all needs within reach. Of note, at end of session, pt consumed 5 oz thin liquids via straw without overt s/s of aspiration.      Function:    Cognition Comprehension Comprehension assist level: Understands complex 90% of the time/cues 10% of the time  Expression   Expression assist level: Expresses complex 90% of the time/cues < 10% of the time  Social Interaction Social Interaction assist level:  Interacts appropriately with others - No medications needed.  Problem Solving Problem solving assist level: Solves complex 90% of the time/cues < 10% of the time  Memory Memory assist level: Recognizes or recalls 90% of the time/requires cueing < 10% of the time    Pain    Therapy/Group: Individual Therapy  Kemesha Mosey B. Rutherford Nail, M.S., CCC-SLP Speech-Language Pathologist  Karim Aiello 02/10/2017, 3:07 PM

## 2017-02-11 ENCOUNTER — Inpatient Hospital Stay (HOSPITAL_COMMUNITY): Payer: Medicare Other | Admitting: Speech Pathology

## 2017-02-11 ENCOUNTER — Inpatient Hospital Stay (HOSPITAL_COMMUNITY): Payer: Medicare Other | Admitting: Occupational Therapy

## 2017-02-11 ENCOUNTER — Inpatient Hospital Stay (HOSPITAL_COMMUNITY): Payer: Medicare Other | Admitting: Physical Therapy

## 2017-02-11 DIAGNOSIS — Z931 Gastrostomy status: Secondary | ICD-10-CM

## 2017-02-11 LAB — PROTIME-INR
INR: 3.88
Prothrombin Time: 39.1 seconds — ABNORMAL HIGH (ref 11.4–15.2)

## 2017-02-11 LAB — BASIC METABOLIC PANEL
ANION GAP: 11 (ref 5–15)
Anion gap: 10 (ref 5–15)
BUN: 16 mg/dL (ref 6–20)
BUN: 18 mg/dL (ref 6–20)
CALCIUM: 8.6 mg/dL — AB (ref 8.9–10.3)
CHLORIDE: 101 mmol/L (ref 101–111)
CO2: 25 mmol/L (ref 22–32)
CO2: 26 mmol/L (ref 22–32)
Calcium: 8.8 mg/dL — ABNORMAL LOW (ref 8.9–10.3)
Chloride: 103 mmol/L (ref 101–111)
Creatinine, Ser: 0.97 mg/dL (ref 0.61–1.24)
Creatinine, Ser: 1.17 mg/dL (ref 0.61–1.24)
GFR calc Af Amer: >60 mL/min (ref >60)
GFR calc non Af Amer: >60 mL/min (ref >60)
GFR calc non Af Amer: >60 mL/min (ref >60)
GLUCOSE: 105 mg/dL — AB (ref 65–99)
Glucose, Bld: 122 mg/dL — ABNORMAL HIGH (ref 65–99)
POTASSIUM: 3.1 mmol/L — AB (ref 3.5–5.1)
POTASSIUM: 3.4 mmol/L — AB (ref 3.5–5.1)
SODIUM: 137 mmol/L (ref 135–145)
Sodium: 139 mmol/L (ref 135–145)

## 2017-02-11 LAB — GLUCOSE, CAPILLARY
Glucose-Capillary: 108 mg/dL — ABNORMAL HIGH (ref 65–99)
Glucose-Capillary: 185 mg/dL — ABNORMAL HIGH (ref 65–99)
Glucose-Capillary: 102 mg/dL — ABNORMAL HIGH (ref 65–99)
Glucose-Capillary: 156 mg/dL — ABNORMAL HIGH (ref 65–99)

## 2017-02-11 NOTE — Progress Notes (Signed)
Social Work Patient ID: Scott [name], male   DOB: 1946/01/26, 71 y.o.   MRN: [id number]   Pt reports he is already aware of targeted d/c date of 3/30 and is agreeable.  OT able to review with pt and wife immediatley following team conference.  Wife not present yesterday so I have not been able to follow up. Will do this upon my return on Monday.  Need to review DME, assist and follow up needs.  Yaminah Clayborn

## 2017-02-11 NOTE — Progress Notes (Signed)
Occupational Therapy Weekly Progress Note  Patient Details  Name: Scott [name] MRN: [id number] Date of Birth: 01-Jun-1946  Beginning of progress report period: February 06, 2017 End of progress report period: February 11, 2017  Today's Date: 02/11/2017 OT Individual Time: 1300-1430 OT Individual Time Calculation (min): 90 min    Patient has met 3 of 3 short term goals, one STG d/c for safety reasons. Pt making excellent progress towards OT goals. He demonstrates excellent problem solving skills to allow for increased independence with ADLs and remains motivated to reach highest level of independence possible. Pt will cont to benefit from skilled OT services in order to cont to increase independence and reduce caregiver burden. Will focus on family education and hands on training with family/ caregivers during tx sessions in prep for d/c home next week.   Patient continues to demonstrate the following deficits: muscle weakness, decreased cardiorespiratoy endurance and decreased sitting balance, decreased postural control and decreased balance strategies and therefore will continue to benefit from skilled OT intervention to enhance overall performance with BADL and Reduce care partner burden.  Patient progressing toward long term goals..  Continue plan of care.  OT Short Term Goals Week 1:  OT Short Term Goal 1 (Week 1): Pt will complete bathing with Min A and use of bath mits OT Short Term Goal 1 - Progress (Week 1): Met OT Short Term Goal 2 (Week 1): Pt will complete toilet transfer with LRAD and Max A OT Short Term Goal 2 - Progress (Week 1): Met OT Short Term Goal 3 (Week 1): Pt will maintain dynamic sitting balance at EOB with supervision during ADLs OT Short Term Goal 3 - Progress (Week 1): Discontinued (comment) (D/c due to safety concerns; pt able to complete in long sitting at bed level) OT Short Term Goal 4 (Week 1): Pt will complete self feeding with AE and setup OT Short Term Goal 4  - Progress (Week 1): Met Week 2:  OT Short Term Goal 1 (Week 2): STG=TG due to LOS  Skilled Therapeutic Interventions/Progress Updates:    Pt seen for OT ADL bathing/ dressing session. Pt in supine upon arrival, eager for showering task. Completed functional transfers throughout session with use of Maximove. Transferred to roll in shower chair. B LEs, trach site, R hand (with thumb sticking out) and PEG site all wrapped in waterproof dressing. He bathed with assist, able to manage hand held shower head with R UE. Pt began to bleed at [facility] site, RN aware and assessed, cleared to cont with shower.  He returned to bed and dressings changed by RN. Dressed from bed level, assist to pull pants up entirely and VCs for problem solving/ positioning for increased independence.  Pt left in supine at end of session, all needs in reach.   Therapy Documentation Precautions:  Precautions Precautions: Fall, Other (comment) Precaution Comments: Left UE transmetacarpal amputation, R UE MCP amputation, bilateral BKA, G tube Restrictions Weight Bearing Restrictions: Yes Pain:   No/ denies pain ADL: ADL ADL Comments: Please see functional navigator for ADL status  See Function Navigator for Current Functional Status.   Therapy/Group: Individual Therapy  Lewis, Marquesa Rath C 02/11/2017, 7:14 AM

## 2017-02-11 NOTE — Progress Notes (Signed)
48mn Patient is NPO after MN for PEG removal, patient is able to comprehend instructions

## 2017-02-11 NOTE — Progress Notes (Signed)
Speech Language Pathology Daily Session Note  Patient Details  Name: Scott [name] MRN: [id number] Date of Birth: [birth year]  Today's Date: 02/11/2017 SLP Individual Time: 1100-1200 SLP Individual Time Calculation (min): 60 min  Short Term Goals: Week 1: SLP Short Term Goal 1 (Week 1): Patient will consume current diet without overt s/s of aspiration and supervision verbal cues for use of swallowing compensotry strategies.  SLP Short Term Goal 1 - Progress (Week 1): Met SLP Short Term Goal 2 (Week 1): Patient will apply pressure to stoma during verbal expression to maximize vocal quality and overall speech intelligibility at the conversation level to 100% with supervision verbal cues.  SLP Short Term Goal 3 (Week 1): Patient will recall new, daily information with utilization of memory compensatory strategies and external aids with Min A verbal and visual cues.   Skilled Therapeutic Interventions: Skilled treatment session focused on cognitive and dysphagia goals. SLP facilitated session by providing verbal cue X 1 for patient to recall procedures to a previously learned, novel task. Patient consumed his lunch meal of regular textures with thin liquids without overt s/s of aspiration and was Mod I for use of swallowing compensatory strategies. Patient has met his dysphagia goals but will need continued skilled SLP intervention to focus on recall of new information and speech intelligibility. Patient handed off to NT and RN. Continue with current plan of care.      Function:  Eating Eating     Eating Assist Level: Assistive Device;More than reasonable amount of time;Set up assist for Eating Assistive Device Comment: Universal cuff Eating Set Up Assist For: Opening containers;Cutting food       Cognition Comprehension Comprehension assist level: Follows complex conversation/direction with extra time/assistive device  Expression   Expression assist level: Expresses complex 90% of the  time/cues < 10% of the time  Social Interaction Social Interaction assist level: Interacts appropriately with others - No medications needed.  Problem Solving Problem solving assist level: Solves complex problems: With extra time  Memory Memory assist level: Recognizes or recalls 90% of the time/requires cueing < 10% of the time    Pain No/Denies Pain   Therapy/Group: Individual Therapy  Scott [name] 02/11/2017, 4:26 PM

## 2017-02-11 NOTE — Patient Care Conference (Signed)
Inpatient RehabilitationTeam Conference and Plan of Care Update Date: 02/09/2017   Time: 2:15 PM    Patient Name: Scott [name]      Medical Record Number: [id number]  Date of Birth: 16-Oct-1946 Sex: Male         Room/Bed: 4W03C/4W03C-01 Payor Info: Payor: MEDICARE / Plan: MEDICARE PART A AND B / Product Type: *No Product type* /    Admitting Diagnosis: Debility  Admit Date/Time:  02/05/2017  4:03 PM Admission Comments: No comment available   Primary Diagnosis:  Status post bilateral below knee amputation ([place]) Principal Problem: Status post bilateral below knee amputation [facility])  Patient Active Problem List   Diagnosis Date Noted  . Status post bilateral below knee amputation ([place]) 02/09/2017  . Wound disruption, post-op, skin, sequela 02/09/2017  . Debility 02/05/2017  . Ganglion upper arm, right   . Thrombosis of both upper extremities   . Suspected heparin induced thrombocytopenia (HIT) in hospitalized patient ([place])   . Gangrene of lower extremity ([place])   . Heparin induced thrombocytopenia (HIT) ([place])   . Tracheostomy status ([place])   . Chest tube in place   . Atherosclerosis of native arteries of extremities with gangrene, left leg ([place])   . Atherosclerosis of native arteries of extremities with gangrene, right leg ([place])   . Acute on chronic diastolic CHF (congestive heart failure) ([place])   . Enteritis due to Clostridium difficile   . Anasarca   . FUO (fever of unknown origin)   . Acute encephalopathy   . Elevated LFTs   . DIC (disseminated intravascular coagulation) ([place]) 10/28/2016  . Postoperative atrial fibrillation ([place]) 10/24/2016  . Cardiogenic shock ([place])   . Mitral regurgitation due to cusp prolapse 10/22/2016  . S/P aortic valve replacement with bioprosthetic valve 10/22/2016  . S/P ascending aortic aneurysm repair 10/22/2016  . S/P mitral valve repair 10/22/2016  . Hx of CABG 10/22/2016  . Thrombocytopenia ([place]) 10/22/2016  . Acute combined systolic and  diastolic congestive heart failure ([place]) 10/22/2016  . Acute respiratory failure ([place]) 10/22/2016  . Thoracic ascending aortic aneurysm ([place]) 10/21/2016  . Coronary artery disease involving native coronary artery of native heart with unstable angina pectoris ([place]) 10/20/2016  . Severe aortic stenosis by prior echocardiography 10/19/2016  . Unstable angina ([place]) 10/19/2016  . Aortic valve stenosis 10/19/2016  . Bicuspid aortic valve 10/19/2016  . Trigger ring finger of right hand   . Wears glasses   . Gout   . Hypertension   . Carpal tunnel syndrome of right wrist   . Arthritis   . Snores     Expected Discharge Date: Expected Discharge Date: 02/19/17  Team Members Present: Physician leading conference: Ilean Skill, PsyD;Dr. [place] Worker Present: Ovidio Kin, LCSW Nurse Present: Elliot Cousin, RN PT Present: Canary Brim, Harriet Pho, PT OT Present: Napoleon Form, OT SLP Present: Stormy Fabian, SLP PPS Coordinator present : Daiva Nakayama, RN, CRRN     Current Status/Progress Goal Weekly Team Focus  Medical   four limb amputee, deconditioning after AVR/CABG. trach out. numerous wounds  improve functional activity toleracne  wound care, nutrition, volume mgt   Bowel/Bladder   Continent of bowel/bladder   Remain comtinent   Assess for changes in bowel and bladder habits   Swallow/Nutrition/ Hydration   Supervision with least restrictive diet  Mod I  introduction of compensatory swallow strategies   ADL's   +2 toilet transfers, max A toileting, min-mod A bathing/dressing  Mod A toilet and shower transfers, mod  A toileting, min A bathing UB dressing  Functional transfers, modified ADLs, family training, d/c planning   Mobility   modA bed mobility, max/totalA transfers with lift vs transfer board  S bed mobility, modA transfers w/c <>bed, minA w/c propulsion  activity tolerance, core/extremity strengthening, transfer training, pt/family education    Communication   Supervision at the conversation level  Mod I  use of hand to cover bandages over stoma site, increased vocal intensity   Safety/Cognition/ Behavioral Observations  Superviison for recall of new information  Mod I  use of compensatory memory strategies   Pain   verbalized no pain   medicate upon request, pin free or minimal < 2-3  Assess pain QS and prn   Skin   Bilateral amputee wounds healing with small amopunt drainage to rt stump/   Monitor for s/s of infection to wounds  No skin breakdown while in Rehab    Rehab Goals Patient on target to meet rehab goals: Yes *See Care Plan and progress notes for long and short-term goals.  Barriers to Discharge: new 4 limb amputations    Possible Resolutions to Barriers:  adaptive techniques and equipoment, family ed    Discharge Planning/Teaching Needs:  Home with wife who can provide 24/7 assistance.  Teaching to be completed with wife prior to d/c.   Team Discussion:  Cont b/b.  Mod assist goals overall with transfers.  WOC following.  Currently max/total assist with lift and tf board.  Pain being managed.  Pt set on wanting prostheses.  Air escaping through stoma which should be closing.  Goals will be family training - wound care.  May need 2 people at times at home.    Revisions to Treatment Plan:  None   Continued Need for Acute Rehabilitation Level of Care: The patient requires daily medical management by a physician with specialized training in physical medicine and rehabilitation for the following conditions: Daily direction of a multidisciplinary physical rehabilitation program to ensure safe treatment while eliciting the highest outcome that is of practical value to the patient.: Yes Daily medical management of patient stability for increased activity during participation in an intensive rehabilitation regime.: Yes Daily analysis of laboratory values and/or radiology reports with any subsequent need for medication  adjustment of medical intervention for : Post surgical problems;Wound care problems;Nutritional problems  Amyre Segundo 02/11/2017, 5:51 PM

## 2017-02-11 NOTE — Progress Notes (Signed)
Physical Therapy Session Note  Patient Details  Name: Scott [name] MRN: [id number] Date of Birth: Jul 26, 1946  Today's Date: 02/11/2017 PT Individual Time: 0830-0930 PT Individual Time Calculation (min): 60 min   Short Term Goals: Week 1:  PT Short Term Goal 1 (Week 1): Patient will maintain sitting balance with supervision assist  PT Short Term Goal 2 (Week 1): patient will perform WC<>bed transfer with max assist consistently PT Short Term Goal 3 (Week 1): Patient will perform bed mobility with min assist form PT.  PT Short Term Goal 4 (Week 1): Patient will iniate WC proplusion.   Skilled Therapeutic Interventions/Progress Updates: Pt received supine in bed, denies pain and agreeable to treatment. Pt dons shorts with minA and Bed elevated; requires minimal assistance for lifting shorts to allow L hand to hook onto pants and pull over L hip. Supine>sit with modA HOB elevated. Transfer bed>w/c modA d/t level transfer. Transfer w/c <>mat x3 trials during session with beasy board and minA. Pt transferred onto manual w/c; propelled with BUE and S; increased time and occasional difficulty with grasping w/c rims d/t residual limb shape. Returned to tilt-in-space w/c at end of session; pt able to verbalize amount of times pressure relief must be performed before next session. Therapist wrote grid on pt's daily schedule to help recall when to perform, and check off when completed.      Therapy Documentation Precautions:  Precautions Precautions: Fall, Other (comment) Precaution Comments: Left UE transmetacarpal amputation, R UE MCP amputation, bilateral BKA, G tube Restrictions Weight Bearing Restrictions: Yes  See Function Navigator for Current Functional Status.   Therapy/Group: Individual Therapy  Luberta Mutter 02/11/2017, 10:00 AM

## 2017-02-11 NOTE — Progress Notes (Signed)
ANTICOAGULATION CONSULT NOTE - Follow Up Consult  Pharmacy Consult for coumadin Indication: afib / RV thrombus  Allergies  Allergen Reactions  . Doxycycline Hives  . Penicillins Hives     Has patient had a PCN reaction causing immediate rash, facial/tongue/throat swelling, SOB or lightheadedness with hypotension: No Has patient had a PCN reaction causing severe rash involving mucus membranes or skin necrosis: No Has patient had a PCN reaction that required hospitalization: No Has patient had a PCN reaction occurring within the last 10 years:# # # YES # # #  If all of the above answers are "NO", then may proceed with Cephalosporin use.   Ignacia Bayley Antibiotics Hives    Patient Measurements: Weight: 156 lb (70.8 kg) Heparin Dosing Weight:   Vital Signs: Temp: 97.8 F (36.6 C) (03/22 0408) Temp Source: Oral (03/22 0408) BP: 107/70 (03/22 0408) Pulse Rate: 78 (03/22 0408)  Labs:  Recent Labs  02/09/17 0642 02/10/17 0938 02/11/17 0502  LABPROT 28.2* 36.2* 39.1*  INR 2.59 3.53 3.88  CREATININE  --   --  0.97    Estimated Creatinine Clearance: 70.9 mL/min (by C-G formula based on SCr of 0.97 mg/dL).   Medications:  Scheduled:  . amiodarone  100 mg Oral Daily  . aspirin EC  81 mg Oral Daily  . atenolol  25 mg Oral Daily  . atorvastatin  40 mg Oral q1800  . Chlorhexidine Gluconate Cloth  6 each Topical Q0600  . ciprofloxacin  500 mg Oral BID  . collagenase   Topical Daily  . digoxin  0.125 mg Oral Daily  . famotidine  20 mg Oral BID  . ferrous sulfate  325 mg Oral BID WC  . furosemide  40 mg Oral BID  . gabapentin  300 mg Oral BID  . hydrocerin   Topical BID  . insulin aspart  0-5 Units Subcutaneous QHS  . insulin aspart  0-9 Units Subcutaneous TID WC  . insulin glargine  5 Units Subcutaneous BID  . lisinopril  2.5 mg Oral Daily  . Melatonin  3 mg Oral QHS  . mupirocin ointment  1 application Nasal BID  . potassium chloride  20 mEq Oral Daily  . rOPINIRole  2 mg  Oral QHS  . sertraline  75 mg Oral Daily  . vitamin C  500 mg Oral Daily  . Warfarin - Pharmacist Dosing Inpatient   Does not apply q1800  . zinc sulfate  220 mg Oral BID  . zolpidem  5 mg Oral QHS   Infusions:    Assessment: 71 yo male with afib / RV thrombus is currently on supratherapeutic coumadin.  INR today is 3.88.  Goal of Therapy:  INR 2-3 Monitor platelets by anticoagulation protocol: Yes   Plan:  - hold coumadin tonight - INR in am  Viana Sleep, Tsz-Yin 02/11/2017,7:23 AM

## 2017-02-11 NOTE — Progress Notes (Signed)
[facility] PHYSICAL MEDICINE & REHABILITATION     PROGRESS NOTE    Subjective/Complaints: No new complaints. Slept soundly last night. Pain controlled  ROS: pt denies nausea, vomiting, diarrhea, cough, shortness of breath or chest pain     Objective: Vital Signs: Blood pressure 107/70, pulse 78, temperature 97.8 F (36.6 C), temperature source Oral, resp. rate 16, weight 70.8 kg (156 lb), SpO2 98 %. No results found. No results for input(s): WBC, HGB, HCT, PLT in the last 72 hours.  Recent Labs  02/11/17 0502  NA 139  K 3.1*  CL 103  GLUCOSE 105*  BUN 16  CREATININE 0.97  CALCIUM 8.6*   CBG (last 3)   Recent Labs  02/10/17 1654 02/10/17 2215 02/11/17 0637  GLUCAP 128* 122* 108*    Wt Readings from Last 3 Encounters:  02/11/17 70.8 kg (156 lb)  01/31/17 101 kg (222 lb 10.6 oz)  02/04/17 101 kg (222 lb 10.6 oz)    Physical Exam:  Constitutional: He is oriented to person, place, and time. He appears well-developed and well-nourished.  No distress  HENT:  Head: Normocephalic and atraumatic.  Eyes: Conjunctivae are normal. Pupils are equal, round, and reactive to light.  Neck: Normal range of motion. Neck supple.  Stoma still open with leakage where dressing not secured  Cardiovascular: RRR   Respiratory: CTA.    GI: Soft. Bowel sounds are normal. He exhibits no distension. There is no tenderness.  PEG site clean and dry Genitourinary:  Genitourinary Comments: scrotum packed  Musculoskeletal:  B-BKA incisions-necrotic tissue and eschar liquifying. Pink granulation underneath Right MCP site with incision intact and sutures in place, dry, dressed Neurological: He is alert and oriented to person, place, and time. Coordination abnormal.  Speech clear. Reasonable insight and awareness. UE limited by discomfort to an extent. B/L deltoid, biceps, triceps are 4-/5. Wrist and fingers limited by splints and amputations. LE: 3+/5 HF, 3+/5 KE.   Skin: Scrotal wound  with drainage from drain site buttocks reddened, scarred.    Psychiatric: pleasant and appropriate    Assessment/Plan: 1. Debility and mobility/functional deficits secondary to multiple medical issues, 4 limb amputations which require 3+ hours per day of interdisciplinary therapy in a comprehensive inpatient rehab setting. Physiatrist is providing close team supervision and 24 hour management of active medical problems listed below. Physiatrist and rehab team continue to assess barriers to discharge/monitor patient progress toward functional and medical goals.  Function:  Bathing Bathing position   Position: Bed  Bathing parts Body parts bathed by patient: Right arm, Left arm, Chest, Abdomen, Right upper leg, Left upper leg Body parts bathed by helper: Front perineal area, Buttocks, Back  Bathing assist Assist Level: Touching or steadying assistance(Pt > 75%)      Upper Body Dressing/Undressing Upper body dressing   What is the patient wearing?: Pull over shirt/dress     Pull over shirt/dress - Perfomed by patient: Thread/unthread right sleeve, Thread/unthread left sleeve, Put head through opening, Pull shirt over trunk Pull over shirt/dress - Perfomed by helper: Pull shirt over trunk, Put head through opening        Upper body assist Assist Level: Touching or steadying assistance(Pt > 75%)      Lower Body Dressing/Undressing Lower body dressing   What is the patient wearing?: Pants   Underwear - Performed by helper: Thread/unthread right underwear leg, Thread/unthread left underwear leg, Pull underwear up/down Pants- Performed by patient: Thread/unthread right pants leg, Thread/unthread left pants leg, Pull pants up/down  Pants- Performed by helper: Thread/unthread right pants leg, Thread/unthread left pants leg, Pull pants up/down                      Lower body assist Assist for lower body dressing: Touching or steadying assistance (Pt > 75%)       Toileting Toileting Toileting activity did not occur: No continent bowel/bladder event Toileting steps completed by patient: Adjust clothing prior to toileting Toileting steps completed by helper: Performs perineal hygiene, Adjust clothing after toileting    Toileting assist Assist level: Two helpers   Transfers Chair/bed transfer   Chair/bed transfer method: Lateral scoot Chair/bed transfer assist level: Moderate assist (Pt 50 - 74%/lift or lower) Chair/bed transfer assistive device: Armrests, Other (beasy board)     Locomotion Ambulation           Wheelchair     Max wheelchair distance: 133ft  Assist Level: Dependent (Pt equals 0%) (in TIS WC )  Cognition Comprehension Comprehension assist level: Understands complex 90% of the time/cues 10% of the time  Expression Expression assist level: Expresses complex 90% of the time/cues < 10% of the time  Social Interaction Social Interaction assist level: Interacts appropriately with others - No medications needed.  Problem Solving Problem solving assist level: Solves complex 90% of the time/cues < 10% of the time  Memory Memory assist level: Recognizes or recalls 90% of the time/requires cueing < 10% of the time   Medical Problem List and Plan: 1.  Deconditioning and functional/mobility deficits secondary to complications after CABG/AVR with subsequent respiratory failure as well as ischemic injury to all 4 limbs which required bilateral BKA's and bilateral TM-C amputations.              -continue therapies  -has a great attitude 2.  RV thrombus/Anticoagulation: Pharmaceutical: Coumadin and Heparin 3. Pain Management: Prn medications effective.  4. Mood: Motivated to get stronger. LCSW to follow for evaluation and support.  5. Neuropsych: This patient is capable of making decisions on his own behalf. 6. Skin/Wound Care: Monitor wounds for healing.     -pink tape for trach stoma (to cover entirety of dressing!) 7.  Fluids/Electrolytes/Nutrition: good po intake  -Removed PEG today with traction  -resume diet at lunch  -daily dressing  8. CAD s/p AVR: continue IV heparin till INR therapeutic.On ASA, Atenolol and Lipitor.  9. A fib: Monitor HR bid. Continue digoxin, atenolol and amiodarone.  10. B-BKA, Left transmetatarsal and right MCP amputations due to DIC:               - santyl dressing BLE is effective   -pink granulation tissues surfacing underneath 11. 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   -continue scrotal packing---drainage less 12. Diarrhea: generally mproving on lomotil prn 13. Anemia of chronic illness: Slowly improving --will continue to monitor for trends.  14. T2DM: Monitor BS ac/hs. Continue lantus insulin and titrate as indicated.   -SSI 15. HTN: Monitor BP bid--on lisinopril, lasix, digoxin and atenolol  16. Uro: wound care as above  -emptying well so far with low pvr's  LOS (Days) 6 A FACE TO FACE EVALUATION WAS PERFORMED  Meredith Staggers, MD 02/11/2017 8:39 AM

## 2017-02-12 ENCOUNTER — Inpatient Hospital Stay (HOSPITAL_COMMUNITY): Payer: Medicare Other | Admitting: Physical Therapy

## 2017-02-12 ENCOUNTER — Inpatient Hospital Stay (HOSPITAL_COMMUNITY): Payer: Medicare Other

## 2017-02-12 ENCOUNTER — Inpatient Hospital Stay (HOSPITAL_COMMUNITY): Payer: Medicare Other | Admitting: Speech Pathology

## 2017-02-12 LAB — GLUCOSE, CAPILLARY
GLUCOSE-CAPILLARY: 108 mg/dL — AB (ref 65–99)
Glucose-Capillary: 92 mg/dL (ref 65–99)
Glucose-Capillary: 152 mg/dL — ABNORMAL HIGH (ref 65–99)
Glucose-Capillary: 158 mg/dL — ABNORMAL HIGH (ref 65–99)

## 2017-02-12 LAB — PROTIME-INR
INR: 3.2
PROTHROMBIN TIME: 33.4 s — AB (ref 11.4–15.2)

## 2017-02-12 MED ORDER — WARFARIN SODIUM 1 MG PO TABS
1.0000 mg | ORAL_TABLET | Freq: Once | ORAL | Status: AC
  Administered 2017-02-12: 1 mg via ORAL
  Filled 2017-02-12: qty 1

## 2017-02-12 NOTE — Progress Notes (Signed)
Speech Language Pathology Weekly Progress and Session Note  Patient Details  Name: Scott [name] MRN: [id number] Date of Birth: 11-13-1946  Beginning of progress report period: February 05, 2017 End of progress report period: February 12, 2017  Today's Date: 02/12/2017 SLP Individual Time: 0730-0830 SLP Individual Time Calculation (min): 60 min  Short Term Goals: Week 1: SLP Short Term Goal 1 (Week 1): Patient will consume current diet without overt s/s of aspiration and supervision verbal cues for use of swallowing compensotry strategies.  SLP Short Term Goal 1 - Progress (Week 1): Met SLP Short Term Goal 2 (Week 1): Patient will apply pressure to stoma during verbal expression to maximize vocal quality and overall speech intelligibility at the conversation level to 100% with supervision verbal cues.  SLP Short Term Goal 2 - Progress (Week 1): Met SLP Short Term Goal 3 (Week 1): Patient will recall new, daily information with utilization of memory compensatory strategies and external aids with Min A verbal and visual cues.  SLP Short Term Goal 3 - Progress (Week 1): Met    New Short Term Goals: Week 2: SLP Short Term Goal 1 (Week 2): Patient will apply pressure to stoma during verbal expression to maximize vocal quality and overall speech intelligibility at the conversation level to 100% with Mod I.  SLP Short Term Goal 2 (Week 2): Patient will recall new, daily information with utilization of memory compensatory strategies and external aids with Mod I.   Weekly Progress Updates: Patient has made excellent gains and has met 3 of 3 STG's this reporting period. Currently, patient is consuming regular textures with thin liquids without overt s/s of aspiration and is Mod I for use of swallowing compensatory strategies. Patient also requires overall supervision verbal cues for recall of new, functional information in regards to directing care and for applying pressure to his stoma to maximize vocal  intensity and overall speech intelligibility to 100% at the conversation level. Due to patient's progress, his plan of care has been changed to 3X/week. Patient would benefit from continued skilled SLP intervention to maximize his cognitive function and overall functional independence prior to discharge.      Intensity: Minumum of 1-2 x/day, 30 to 90 minutes Frequency: 1 to 3 out of 7 days Duration/Length of Stay: 02/19/17 Treatment/Interventions: Cognitive remediation/compensation;Cueing hierarchy;Functional tasks;Patient/family education;Environmental controls;Therapeutic Activities;Speech/Language facilitation;Internal/external aids   Daily Session  Skilled Therapeutic Interventions: Skilled treatment session focused on cognitive goals. SLP facilitated session by providing supervision verbal cues for patient to apply pressure to stoma to maximize vocall intensity to achieve 100% intelligibility. Patient also required supervision verbal cues to verbalize sequence/steps to a slide board transfer and for his pressure relief Schedule. Patient was Mod I for directing care to donn universal cuff in order to complete self-care tasks. Patient left upright in wheelchair at sink with all needs within reach. Continue with current plan of care.    Function:   Eating Eating   Modified Consistency Diet: No Eating Assist Level: Assistive Device;More than reasonable amount of time;Set up assist for Eating Assistive Device Comment: Universal cuff Eating Set Up Assist For: Opening containers;Cutting food       Cognition Comprehension Comprehension assist level: Follows complex conversation/direction with extra time/assistive device  Expression   Expression assist level: Expresses complex 90% of the time/cues < 10% of the time  Social Interaction Social Interaction assist level: Interacts appropriately with others - No medications needed.  Problem Solving Problem solving assist level: Solves complex  problems:  With extra time  Memory Memory assist level: Recognizes or recalls 90% of the time/requires cueing < 10% of the time   Pain No/Denies Pain   Therapy/Group: Individual Therapy  Adriane Guglielmo 02/12/2017, 4:34 PM

## 2017-02-12 NOTE — Progress Notes (Signed)
Social Work Patient ID: Scott [name], male   DOB: 06-29-1946, 71 y.o.   MRN: [id number]   CSW received phone call from pt's wife who asked questions about DME, family education scheduled for tomorrow, and home health follow up at home.  Wife plans to discuss w/c options with pt before we order anything.  Told her about hospital bed rental and that [facility] will order DME and arrange [place] closer to d/c.

## 2017-02-12 NOTE — Evaluation (Signed)
Recreational Therapy Assessment and Plan  Patient Details  Name: Scott [name] MRN: [id number] Date of Birth: 11-Nov-1946 Today's Date: 02/12/2017  Rehab Potential: Good ELOS: 10 days   Assessment Clinical Impression: Problem List:      Patient Active Problem List   Diagnosis Date Noted  . Debility 02/05/2017  . Ganglion upper arm, right   . Thrombosis of both upper extremities   . Suspected heparin induced thrombocytopenia (HIT) in hospitalized patient ([place])   . Gangrene of lower extremity ([place])   . Heparin induced thrombocytopenia (HIT) ([place])   . Tracheostomy status ([place])   . Chest tube in place   . Atherosclerosis of native arteries of extremities with gangrene, left leg ([place])   . Atherosclerosis of native arteries of extremities with gangrene, right leg ([place])   . Acute on chronic diastolic CHF (congestive heart failure) ([place])   . Enteritis due to Clostridium difficile   . Anasarca   . FUO (fever of unknown origin)   . Acute encephalopathy   . Elevated LFTs   . DIC (disseminated intravascular coagulation) ([place]) 10/28/2016  . Postoperative atrial fibrillation ([place]) 10/24/2016  . Cardiogenic shock ([place])   . Mitral regurgitation due to cusp prolapse 10/22/2016  . S/P aortic valve replacement with bioprosthetic valve 10/22/2016  . S/P ascending aortic aneurysm repair 10/22/2016  . S/P mitral valve repair 10/22/2016  . Hx of CABG 10/22/2016  . Thrombocytopenia ([place]) 10/22/2016  . Acute combined systolic and diastolic congestive heart failure ([place]) 10/22/2016  . Acute respiratory failure ([place]) 10/22/2016  . Thoracic ascending aortic aneurysm ([place]) 10/21/2016  . Coronary artery disease involving native coronary artery of native heart with unstable angina pectoris ([place]) 10/20/2016  . Severe aortic stenosis by prior echocardiography 10/19/2016  . Unstable angina ([place]) 10/19/2016  . Aortic valve stenosis 10/19/2016  . Bicuspid aortic valve 10/19/2016  .  Trigger ring finger of right hand   . Wears glasses   . Gout   . Hypertension   . Carpal tunnel syndrome of right wrist   . Arthritis   . Snores     Past Medical History:      Past Medical History:  Diagnosis Date  . Arthritis    "hips, shoulders; knees; back" (10/20/2016)  . Bicuspid aortic valve   . BPH (benign prostatic hypertrophy)   . Carpal tunnel syndrome of right wrist   . Coronary artery disease involving native coronary artery of native heart with unstable angina pectoris ([place]) 10/20/2016  . Diverticulitis   . GERD (gastroesophageal reflux disease)   . Gout   . Heart murmur   . Hyperlipemia   . Hypertension   . Postoperative atrial fibrillation ([place]) 10/24/2016  . RLS (restless legs syndrome)   . S/P aortic valve replacement with bioprosthetic valve 10/22/2016   25 mm [facility] Ease bovine pericardial bioprosthetic tissue valve  . S/P ascending aortic aneurysm repair 10/22/2016   28 mm supracoronary straight graft replacement of ascending thoracic aortic aneurysm  . S/P CABG x 3 10/22/2016   Sequential LIMA to Diag and LAD, SVG to distal LAD, open vein harvest right thigh  . S/P mitral valve repair 10/22/2016   Artificial Gore-tex neochord placement x6 - posterior annuloplasty band placed but removed due to systolic anterior motion of mitral valve  . Severe aortic stenosis   . Snores    Never been tested for sleep apnea  . Thoracic ascending aortic aneurysm ([place]) 10/21/2016  . Thrombocytopenia ([place]) 10/22/2016  . Type II diabetes  mellitus [facility])    Past Surgical History:       Past Surgical History:  Procedure Laterality Date  . AMPUTATION Bilateral 12/11/2016   Procedure: AMPUTATION BELOW KNEE BILATERALLY;  Surgeon: [name], MD;  Location: [place];  Service: Orthopedics;  Laterality: Bilateral;  . AMPUTATION Bilateral 12/11/2016   Procedure: AMPUTATION BILATERAL HANDS EXCEPT RIGHT THUMB;  Surgeon: [name], MD;   Location: [place];  Service: Orthopedics;  Laterality: Bilateral;  . AORTIC VALVE REPLACEMENT N/A 10/22/2016   Procedure: AORTIC VALVE REPLACEMENT (AVR) WITH SIZE 25 MM MAGNA EASE PERICARDIAL BIOPROSTHESIS - AORTIC;  Surgeon: Rexene Alberts, MD;  Location: [place];  Service: Open Heart Surgery;  Laterality: N/A;  . BONE EXCISION Right 02/01/2017   Procedure: EXCISION RIGHT INDEX METACARPAL HEAD;  Surgeon: [name], MD;  Location: [place];  Service: Orthopedics;  Laterality: Right;  . CARDIAC CATHETERIZATION N/A 10/20/2016   Procedure: Right/Left Heart Cath and Coronary Angiography;  Surgeon: Leonie Man, MD;  Location: [place] CV LAB;  Service: Cardiovascular;  Laterality: N/A;  . CARPAL TUNNEL RELEASE Right 11/28/2013   Procedure: RIGHT WRIST CARPAL TUNNEL RELEASE;  Surgeon: Lorn Junes, MD;  Location: [place];  Service: Orthopedics;  Laterality: Right;  . CATARACT EXTRACTION W/ INTRAOCULAR LENS  IMPLANT, BILATERAL Bilateral 1978  . COLONOSCOPY    . CORONARY ARTERY BYPASS GRAFT N/A 10/22/2016   Procedure: CORONARY ARTERY BYPASS GRAFTING (CABG)x 2 WITH LIMA TO DIAGONAL, OPEN  HARVESTING OF RIGHT SAPHENOUS VEIN FOR VEIN GRAFT TO LAD;  Surgeon: Rexene Alberts, MD;  Location: [place];  Service: Open Heart Surgery;  Laterality: N/A;  . FRACTURE SURGERY    . INGUINAL HERNIA REPAIR Right 1998  . IR GENERIC HISTORICAL  12/07/2016   IR US GUIDE VASC ACCESS RIGHT 12/07/2016 Aletta Edouard, MD [facility]  . IR GENERIC HISTORICAL  12/07/2016   IR RADIOLOGY PERIPHERAL GUIDED IV START 12/07/2016 Aletta Edouard, MD [facility]  . IR GENERIC HISTORICAL  12/07/2016   IR GASTROSTOMY TUBE MOD SED 12/07/2016 Aletta Edouard, MD [facility]  . LIPOMA EXCISION Right 2008   "side of my head"  . MITRAL VALVE REPAIR N/A 10/22/2016   Procedure: MITRAL VALVE REPAIR (MVR) WITH SIZE 30 SORIN ANNULOFLEX ANNULOPLASTY RING WITH SUBSEQUENT REMOVAL OF RING;  Surgeon: Rexene Alberts, MD;  Location: [place];  Service: Open Heart Surgery;  Laterality: N/A;  . PENECTOMY  2007   Peyronie's disease   . PENILE PROSTHESIS IMPLANT  2009  . REMOVAL OF PENILE PROSTHESIS N/A 02/01/2017   Procedure: REMOVAL OF PENILE PROSTHESIS;  Surgeon: Kathie Rhodes, MD;  Location: [place];  Service: Urology;  Laterality: N/A;  . SHOULDER OPEN ROTATOR CUFF REPAIR Right 2006  . STERNAL CLOSURE N/A 10/26/2016   Procedure: STERNAL WASHOUT AND DELAYED PRIMARY CLOSURE;  Surgeon: Rexene Alberts, MD;  Location: [place];  Service: Thoracic;  Laterality: N/A;  . TEE WITHOUT CARDIOVERSION N/A 10/26/2016   Procedure: TRANSESOPHAGEAL ECHOCARDIOGRAM (TEE);  Surgeon: Rexene Alberts, MD;  Location: [place];  Service: Thoracic;  Laterality: N/A;  . TEE WITHOUT CARDIOVERSION N/A 10/22/2016   Procedure: TRANSESOPHAGEAL ECHOCARDIOGRAM (TEE);  Surgeon: Rexene Alberts, MD;  Location: [place];  Service: Open Heart Surgery;  Laterality: N/A;  . THORACIC AORTIC ANEURYSM REPAIR  10/22/2016   Procedure: ASCENDING AORTIC  ANEURYSM REPAIR (AAA) WITH 28 MM HEMASHIELD PLATINUM WOVEN DOUBLE VELOUR VASCULAR GRAFT;  Surgeon: Rexene Alberts, MD;  Location:  [place] OR;  Service: Open Heart Surgery;;  . TONSILLECTOMY  ~ 1955  . TRACHEOSTOMY TUBE PLACEMENT N/A 11/09/2016   Procedure: TRACHEOSTOMY;  Surgeon: Rexene Alberts, MD;  Location: [place];  Service: Thoracic;  Laterality: N/A;  . TRANSURETHRAL RESECTION OF PROSTATE  2005  . TRIGGER FINGER RELEASE Bilateral    several lt and rt hands  . TRIGGER FINGER RELEASE Right 11/28/2013   Procedure: RIGHT TRIGGER FINGER  RELEASE (TENDON SHEATH INCISION);  Surgeon: Lorn Junes, MD;  Location: [place];  Service: Orthopedics;  Laterality: Right;  Mar[place] Kitchen VIDEO BRONCHOSCOPY N/A 11/09/2016   Procedure: VIDEO BRONCHOSCOPY;  Surgeon: Rexene Alberts, MD;  Location: [facility] OR;  Service: Thoracic;  Laterality: N/A;  . WRIST FRACTURE SURGERY Right ~ 1959    Assessment &  Plan Clinical Impression: Patient is a 71 year old male with history of T2DM, heart murmur, BPH, RLS, peyronie's disease, bicuspid aortic valve with severe stenosi who was originally admitted to [facility] on today 10/20/16 for cardiac cath to work up 2 month history of chest pain with SOB. He was found to have 3 vessel CAD with borderline critical AS and thoracic ascending aneurysm. He was taken to OR on 10/22/16 for CABG X 3, AVR, MVR, repair of thoracic ascending aneurysm with placement of VAC on open chest. Post op course significant for cardiogenic shock requiring multiple pressors, SAM requiring removal of mitral annuloplasty ring, persistent fevers, encephalopathy, shocked liver, DIC due to consumptive intraoperative coagulopathy, diffuse clotting with ischemia of distal BUE/BLE extremities, A fib, C diff colitis, volume overload and VDRF with difficulty with vent wean requiring tracheostomy. Sternal wound closed on 12/4 and was started on coumadin for RV thrombus and A fib.   He was transferred to [facility] on 12/26 for vent wean. Hospital course significant for MRSA bacteremia, pneumonitis, intermittent fevers, dysphagia requiring PEG tube placement on 1/15 by Dr. Kathlene Cote. He developed progressive gangrenous changes of BUE and BLE requiring B-transtibial amputation , left transmetacarpal amputation and Right MCP amputation of right hand by Dr. Sharol Given on 1/19. Has been showing improvement in respiratory status and endurance. He tolerated extubation to ATC and was de-cannulated on 3/12. Tube feeds have been discontinued and diet was been gradually advanced to regular with chopped meats by 3/14.  He developed phimosis with erosion of penile prothesis and purulent drainage and despite antibiotics. Scrotal wound positive for few pseudomonas aeruginosa and moderate bacteroides fragilis therefore antibiotics changed to Zosyn X 1 week. Dr. Karsten Ro consulted for input and recommended removal of prosthesis. He has  had poor healing of right index MCP. Once INR normalized, he underwent removal of penile prosthesis by Dr. Karsten Ro and amputation of index MC.   Patient transferred to CIR on 02/05/2017.   Pt presents with decreased activity tolerance, decreased functional mobility, decreased balance Limiting pt's independence with leisure/community pursuits.  Leisure History/Participation Premorbid leisure interest/current participation: [place] - Fishing;Nature - [place] care;Crafts - Woodworking;Community - Shopping mall;Community - Grocery store;Community - Travel (Comment) ("mr fix it") Other Leisure Interests: Television;Cooking/Baking (grilling) Leisure Participation Style: With Family/Friends Awareness of Community Resources: Excellent Psychosocial / Spiritual Social interaction - Mood/Behavior: Cooperative Academic librarian Appropriate for Education?: Yes Recreational Therapy Orientation Orientation -Reviewed with patient: Available activity resources Strengths/Weaknesses Patient Strengths/Abilities: Willingness to participate Patient weaknesses: Physical limitations TR Patient demonstrates impairments in the following area(s): Edema;Endurance;Motor;Pain;Safety;Skin Integrity  Plan Rec Therapy Plan Is patient appropriate for Therapeutic Recreation?: Yes Rehab Potential: Good Treatment times per week: MIn 1  TR session/group >20 minutes during LOS Estimated Length of Stay: 10 days TR Treatment/Interventions: Adaptive equipment instruction;1:1 session;Balance/vestibular training;Functional mobility training;Community reintegration;Cognitive remediation/compensation;Group participation (Comment);Patient/family education;Therapeutic activities;Recreation/leisure participation;Therapeutic exercise;UE/LE Coordination activities;Wheelchair propulsion/positioning  Recommendations for other services: None   Discharge Criteria: Patient will be discharged from TR if patient refuses  treatment 3 consecutive times without medical reason.  If treatment goals not met, if there is a change in medical status, if patient makes no progress towards goals or if patient is discharged from hospital.  The above assessment, treatment plan, treatment alternatives and goals were discussed and mutually agreed upon: by patient  [employer] 02/12/2017, 4:07 PM

## 2017-02-12 NOTE — Progress Notes (Signed)
[facility] PHYSICAL MEDICINE & REHABILITATION     PROGRESS NOTE    Subjective/Complaints: Up in chair brushing his teeth. Slept well.  ROS: pt denies nausea, vomiting, diarrhea, cough, shortness of breath or chest pain     Objective: Vital Signs: Blood pressure 94/67, pulse 68, temperature 97.5 F (36.4 C), temperature source Oral, resp. rate 18, weight 70.8 kg (156 lb), SpO2 100 %. No results found. No results for input(s): WBC, HGB, HCT, PLT in the last 72 hours.  Recent Labs  02/11/17 0502 02/11/17 2009  NA 139 137  K 3.1* 3.4*  CL 103 101  GLUCOSE 105* 122*  BUN 16 18  CREATININE 0.97 1.17  CALCIUM 8.6* 8.8*   CBG (last 3)   Recent Labs  02/11/17 1702 02/11/17 2122 02/12/17 0651  GLUCAP 102* 156* 92    Wt Readings from Last 3 Encounters:  02/12/17 70.8 kg (156 lb)  01/31/17 101 kg (222 lb 10.6 oz)  02/04/17 101 kg (222 lb 10.6 oz)    Physical Exam:  Constitutional: He is oriented to person, place, and time. He appears well-developed and well-nourished.  No distress  HENT:  Head: Normocephalic and atraumatic.  Eyes: Conjunctivae are normal. Pupils are equal, round, and reactive to light.  Neck: Normal range of motion. Neck supple.  Stoma closing with granulation but still slightly open  Cardiovascular:RRR   Respiratory: CTA B    GI: Soft. Bowel sounds are normal. He exhibits no distension. There is no tenderness.  PEG site clean and dry Genitourinary:  Genitourinary Comments: scrotum packed  Musculoskeletal:  B-BKA incisions-necrotic tissue and eschar liquifying. Pink granulation underneath Right MCP site with incision intact and sutures in place, dry, dressed Neurological: He is alert and oriented to person, place, and time. Coordination abnormal.  Speech clear. Reasonable insight and awareness. UE limited by discomfort to an extent. B/L deltoid, biceps, triceps are 4-/5. Wrist and fingers limited by splints and amputations. LE: 3+/5 HF, 3+/5  KE.   Skin: Scrotal wound with drainage from drain site buttocks reddened, scarred.    Psychiatric: pleasant and appropriate    Assessment/Plan: 1. Debility and mobility/functional deficits secondary to multiple medical issues, 4 limb amputations which require 3+ hours per day of interdisciplinary therapy in a comprehensive inpatient rehab setting. Physiatrist is providing close team supervision and 24 hour management of active medical problems listed below. Physiatrist and rehab team continue to assess barriers to discharge/monitor patient progress toward functional and medical goals.  Function:  Bathing Bathing position   Position: Shower  Bathing parts Body parts bathed by patient: Right arm, Right upper leg, Left arm, Left upper leg, Chest, Abdomen, Front perineal area Body parts bathed by helper: Buttocks, Back  Bathing assist Assist Level: Touching or steadying assistance(Pt > 75%)      Upper Body Dressing/Undressing Upper body dressing   What is the patient wearing?: Pull over shirt/dress     Pull over shirt/dress - Perfomed by patient: Thread/unthread right sleeve, Thread/unthread left sleeve Pull over shirt/dress - Perfomed by helper: Put head through opening, Pull shirt over trunk        Upper body assist Assist Level: Touching or steadying assistance(Pt > 75%)      Lower Body Dressing/Undressing Lower body dressing   What is the patient wearing?: Pants   Underwear - Performed by helper: Thread/unthread right underwear leg, Thread/unthread left underwear leg, Pull underwear up/down Pants- Performed by patient: Thread/unthread right pants leg, Thread/unthread left pants leg Pants- Performed by helper:  Pull pants up/down                      Lower body assist Assist for lower body dressing: Touching or steadying assistance (Pt > 75%)      Toileting Toileting Toileting activity did not occur: No continent bowel/bladder event Toileting steps completed  by patient: Adjust clothing prior to toileting Toileting steps completed by helper: Performs perineal hygiene, Adjust clothing after toileting    Toileting assist Assist level: More than reasonable time, Touching or steadying assistance (Pt.75%)   Transfers Chair/bed transfer   Chair/bed transfer method: Lateral scoot Chair/bed transfer assist level: Moderate assist (Pt 50 - 74%/lift or lower) Chair/bed transfer assistive device: Armrests, Other (beasy board)     Locomotion Ambulation           Wheelchair     Max wheelchair distance: 150 Assist Level: Supervision or verbal cues  Cognition Comprehension Comprehension assist level: Follows complex conversation/direction with extra time/assistive device  Expression Expression assist level: Expresses complex 90% of the time/cues < 10% of the time  Social Interaction Social Interaction assist level: Interacts appropriately with others - No medications needed.  Problem Solving Problem solving assist level: Solves complex problems: With extra time  Memory Memory assist level: Recognizes or recalls 90% of the time/requires cueing < 10% of the time   Medical Problem List and Plan: 1.  Deconditioning and functional/mobility deficits secondary to complications after CABG/AVR with subsequent respiratory failure as well as ischemic injury to all 4 limbs which required bilateral BKA's and bilateral TM-C amputations.              -continue therapies  -has a great attitude! 2.  RV thrombus/Anticoagulation: Pharmaceutical: Coumadin and Heparin 3. Pain Management: Prn medications effective.  4. Mood: Motivated to get stronger. LCSW to follow for evaluation and support.  5. Neuropsych: This patient is capable of making decisions on his own behalf. 6. Skin/Wound Care: Monitor wounds for healing.     -continue pink tape/occlusive dressing. Stoma closing.  7. Fluids/Electrolytes/Nutrition: good po intake  -PEG out without issues. Continue daily  dressing 8. CAD s/p AVR: continue IV heparin till INR therapeutic.On ASA, Atenolol and Lipitor.  9. A fib: Monitor HR bid. Continue digoxin, atenolol and amiodarone.  10. B-BKA, Left transmetatarsal and right MCP amputations due to DIC:               -continue santyl dressing BLE    -pink granulation surfacing 11. 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   -continue scrotal packing---drainage decreasing 12. Diarrhea: generally mproving on lomotil prn 13. Anemia of chronic illness: Slowly improving --re-check 14. T2DM: Monitor BS ac/hs. Continue lantus insulin. Sugars under reasonable control  -SSI 15. HTN: Monitor BP bid--on lisinopril, lasix, digoxin and atenolol  16. Uro: wound care as above  -bladder emptying  LOS (Days) 7 A FACE TO FACE EVALUATION WAS PERFORMED  Meredith Staggers, MD 02/12/2017 8:56 AM

## 2017-02-12 NOTE — Progress Notes (Signed)
Physical Therapy Weekly Progress Note  Patient Details  Name: Scott [name] MRN: [id number] Date of Birth: Sep 24, 1946  Beginning of progress report period: February 06, 2017 End of progress report period: February 12, 2017  Today's Date: 02/12/2017 PT Individual Time: 0900-1000 PT Individual Time Calculation (min): 60 min   Patient has met 3 of 4 short term goals with progress towards remaining goals.  Pt currently requires S sit>supine, modA for supine>sit d/t UE/core deficits. Transfers performed minA/min guard with transfer board and beasy board when transfer setup downhill, modA level surface transfer and from bed with softer support surface and linens. Pt has initiated w/c propulsion in manual chair with BUEs. Wife has performed one transfer, but has not been present in several days for additional hands-on training. Will need extensive hands on training with multiple family members prior to d/c.   Patient continues to demonstrate the following deficits muscle weakness, decreased cardiorespiratoy endurance and decreased sitting balance, decreased standing balance, decreased postural control, decreased balance strategies and difficulty maintaining precautions and therefore will continue to benefit from skilled PT intervention to increase functional independence with mobility.  Patient progressing toward long term goals..  Continue plan of care.  PT Short Term Goals Week 1:  PT Short Term Goal 1 (Week 1): Patient will maintain sitting balance with supervision assist  PT Short Term Goal 1 - Progress (Week 1): Met PT Short Term Goal 2 (Week 1): patient will perform WC<>bed transfer with max assist consistently PT Short Term Goal 2 - Progress (Week 1): Met PT Short Term Goal 3 (Week 1): Patient will perform bed mobility with min assist form PT.  PT Short Term Goal 3 - Progress (Week 1): Partly met PT Short Term Goal 4 (Week 1): Patient will iniate WC proplusion.  PT Short Term Goal 4 - Progress  (Week 1): Met Week 2:  PT Short Term Goal 1 (Week 2): =LTG due to estimated LOS  Skilled Therapeutic Interventions/Progress Updates: Tx 1: Pt received seated in w/c, denies pain and agreeable to treatment. Transfer w/c >mat table and mat table <>manual w/c with min/modA using transfer board; first two trials with slight downhill, last trial to level surface. W/c propulsion in manual chair with BUE and S; discussed extensively with pt and therapy supervisor pros/cons of power chair vs manual chair. Pt plans to discuss with wife is transportation for power chair will be financially feasible. Semi-reclined situps on wedge with rolling R/L to elbow to strengthen core and simulate sidelying>sit with tricep extension. Remained seated on edge of mat table at end of session with handoff to OT.   Tx 2: Pt received seated in bed finishing lunch; requests to finish eating before participating in therapy and declines needing assistance to eat. Bed mobility with modA to sit EOB; transfer to w/c with modA and transfer board. W/c propulsion x150' with BUE and S, slow speed. Transfer w/c >mat table with pt directing care to recreational therapist to practice with explaining level and type of assist needed. Demonstrated to pt how to remove arm rest and leg rest with hand over hand cues. Sitting balance on dynadisc with dynamic beach ball hits for core stability and coordination. Sit>supine with min guard. Bridging on bolster 2x10 reps. Supine>sit with modA from R sidelying. Transfer to return to w/c as above. W/c propulsion to return to room with S. Remained seated in w/c at end of session; NT alerted to pt request to return to bed.      Therapy  Documentation Precautions:  Precautions Precautions: Fall, Other (comment) Precaution Comments: Left UE transmetacarpal amputation, R UE MCP amputation, bilateral BKA, G tube Restrictions Weight Bearing Restrictions: Yes General: PT Amount of Missed Time (min): 15 Minutes PT  Missed Treatment Reason:  (finishing lunch modI)   See Function Navigator for Current Functional Status.  Therapy/Group: Individual Therapy  Scott [name] 02/12/2017, 9:58 AM

## 2017-02-12 NOTE — Progress Notes (Signed)
Occupational Therapy Session Note  Patient Details  Name: Scott [name] MRN: [id number] Date of Birth: 1946/10/13  Today's Date: 02/12/2017 OT Individual Time: 1004-1100 OT Individual Time Calculation (min): 56 min    Short Term Goals: Week 1:  OT Short Term Goal 1 (Week 1): Pt will complete bathing with Min A and use of bath mits OT Short Term Goal 1 - Progress (Week 1): Met OT Short Term Goal 2 (Week 1): Pt will complete toilet transfer with LRAD and Max A OT Short Term Goal 2 - Progress (Week 1): Met OT Short Term Goal 3 (Week 1): Pt will maintain dynamic sitting balance at EOB with supervision during ADLs OT Short Term Goal 3 - Progress (Week 1): Discontinued (comment) (D/c due to safety concerns; pt able to complete in long sitting at bed level) OT Short Term Goal 4 (Week 1): Pt will complete self feeding with AE and setup OT Short Term Goal 4 - Progress (Week 1): Met  Skilled Therapeutic Interventions/Progress Updates:    Pt seen with direct hand off in therapy gym from PT. Pt seated on mat with RN administering meds. Pt able to reach cup on table and gab cup between residual limbs to drink with medication and work on dynamic sitting balance. Pt requires cue to tuck chin when swallowing water/pills. Pt propels w/c to from all therapeutic destinations with increased time and Vc to push harder with RUE to avoid obstacles. Pt completes all slide board transfers on even/slightly elevated surfaces from w/c <> mat, TTB with cut out and bed with MOD A for lifting to improve butt clearance and decrease shearing on buttocks, VC for head hip relationship and hand placement. While seated on commode, pt laterally leans to L with CGA to advance pants past R hip. Pt require A to advance pant down L hip d/t decreased space to lean laterally. With total A to hold urinal, pt voids urine and attempts to have BM however unsuccessful. Pt returned to bed and rolls R/L with supervision to pull blanket from  underneath. Throuhgout session discuss adaptive ways to dress and advance pants up hips after toileting on [facility] such as using shortened dressing stick in a U-cuff on LUE.   Therapy Documentation Precautions:  Precautions Precautions: Fall, Other (comment) Precaution Comments: Left UE transmetacarpal amputation, R UE MCP amputation, bilateral BKA, G tube Restrictions Weight Bearing Restrictions: Yes   ADL: ADL ADL Comments: Please see functional navigator for ADL status Exercises:   Other Treatments:    See Function Navigator for Current Functional Status.   Therapy/Group: Individual Therapy  Tonny Branch 02/12/2017, 12:17 PM

## 2017-02-12 NOTE — Progress Notes (Signed)
ANTICOAGULATION CONSULT NOTE - Follow Up Consult  Pharmacy Consult for coumadin Indication: afib / RV thrombus  Allergies  Allergen Reactions  . Doxycycline Hives  . Penicillins Hives     Has patient had a PCN reaction causing immediate rash, facial/tongue/throat swelling, SOB or lightheadedness with hypotension: No Has patient had a PCN reaction causing severe rash involving mucus membranes or skin necrosis: No Has patient had a PCN reaction that required hospitalization: No Has patient had a PCN reaction occurring within the last 10 years:# # # YES # # #  If all of the above answers are "NO", then may proceed with Cephalosporin use.   Ignacia Bayley Antibiotics Hives    Patient Measurements: Weight: 156 lb (70.8 kg) Heparin Dosing Weight:   Vital Signs: Temp: 97.5 F (36.4 C) (03/23 0458) Temp Source: Oral (03/23 0458) BP: 94/67 (03/23 0458) Pulse Rate: 68 (03/23 0458)  Labs:  Recent Labs  02/10/17 0938 02/11/17 0502 02/11/17 2009 02/12/17 0556  LABPROT 36.2* 39.1*  --  33.4*  INR 3.53 3.88  --  3.20  CREATININE  --  0.97 1.17  --     Estimated Creatinine Clearance: 58.7 mL/min (by C-G formula based on SCr of 1.17 mg/dL).   Assessment: 71 yo male with afib / RV thrombus is currently on supratherapeutic coumadin.  INR today is 3.88 > 3.2.  Goal of Therapy:  INR 2-3 Monitor platelets by anticoagulation protocol: Yes   Plan:  - Coumadin 1 mg po x 1 - INR in am  Maryanna Shape, PharmD, BCPS  Clinical Pharmacist  Pager: [phone]   02/12/2017,3:10 PM

## 2017-02-13 ENCOUNTER — Ambulatory Visit (HOSPITAL_COMMUNITY): Payer: Medicare Other | Admitting: Physical Therapy

## 2017-02-13 ENCOUNTER — Encounter (HOSPITAL_COMMUNITY): Payer: Medicare Other

## 2017-02-13 LAB — PROTIME-INR
INR: 2.92
PROTHROMBIN TIME: 31.1 s — AB (ref 11.4–15.2)

## 2017-02-13 LAB — GLUCOSE, CAPILLARY
GLUCOSE-CAPILLARY: 104 mg/dL — AB (ref 65–99)
Glucose-Capillary: 112 mg/dL — ABNORMAL HIGH (ref 65–99)
Glucose-Capillary: 118 mg/dL — ABNORMAL HIGH (ref 65–99)
Glucose-Capillary: 202 mg/dL — ABNORMAL HIGH (ref 65–99)

## 2017-02-13 MED ORDER — WARFARIN SODIUM 1 MG PO TABS
1.0000 mg | ORAL_TABLET | Freq: Once | ORAL | Status: AC
  Administered 2017-02-13: 1 mg via ORAL
  Filled 2017-02-13: qty 1

## 2017-02-13 NOTE — Progress Notes (Signed)
ANTICOAGULATION CONSULT NOTE - Follow Up Consult  Pharmacy Consult for coumadin Indication: afib / RV thrombus  Allergies  Allergen Reactions  . Doxycycline Hives  . Penicillins Hives     Has patient had a PCN reaction causing immediate rash, facial/tongue/throat swelling, SOB or lightheadedness with hypotension: No Has patient had a PCN reaction causing severe rash involving mucus membranes or skin necrosis: No Has patient had a PCN reaction that required hospitalization: No Has patient had a PCN reaction occurring within the last 10 years:# # # YES # # #  If all of the above answers are "NO", then may proceed with Cephalosporin use.   Scott [name] Antibiotics Hives    Patient Measurements: Weight: 160 lb 8 oz (72.8 kg)  Vital Signs: Temp: 97.6 F (36.4 C) (03/24 0300) Temp Source: Oral (03/24 0300) BP: 99/74 (03/24 0300) Pulse Rate: 67 (03/24 0300)  Labs:  Recent Labs  02/11/17 0502 02/11/17 2009 02/12/17 0556 02/13/17 0529  LABPROT 39.1*  --  33.4* 31.1*  INR 3.88  --  3.20 2.92  CREATININE 0.97 1.17  --   --     Estimated Creatinine Clearance: 58.7 mL/min (by C-G formula based on SCr of 1.17 mg/dL).   Assessment: 71 yo male with afib / RV thrombus on warfarin with previously supratherapeutic INR.  INR today has decreased form 3.20 >> 2.92 and is therapeutic  Goal of Therapy:  INR 2-3 Monitor platelets by anticoagulation protocol: Yes   Plan:  - Coumadin 1 mg po x 1 - Daily INR - Monitor CBC, S/Sx bleeding  Scott [name]), PharmD  [id number] Pharmacy Resident Pager: [phone] 02/13/2017 9:47 AM

## 2017-02-13 NOTE — Progress Notes (Signed)
[place] PHYSICAL MEDICINE & REHABILITATION     PROGRESS NOTE    Subjective/Complaints: Slept well. No new complaints. Still with air leaking through trach stoma   Objective: Vital Signs: Blood pressure 99/74, pulse 67, temperature 97.6 F (36.4 C), temperature source Oral, resp. rate 16, weight 72.8 kg (160 lb 8 oz), SpO2 99 %. No results found. No results for input(s): WBC, HGB, HCT, PLT in the last 72 hours.  Recent Labs  02/11/17 0502 02/11/17 2009  NA 139 137  K 3.1* 3.4*  CL 103 101  GLUCOSE 105* 122*  BUN 16 18  CREATININE 0.97 1.17  CALCIUM 8.6* 8.8*   CBG (last 3)   Recent Labs  02/12/17 1645 02/12/17 2143 02/13/17 0632  GLUCAP 152* 158* 112*    Wt Readings from Last 3 Encounters:  02/13/17 72.8 kg (160 lb 8 oz)  01/31/17 101 kg (222 lb 10.6 oz)  02/04/17 101 kg (222 lb 10.6 oz)    Physical Exam:  Constitutional: He is oriented to person, place, and time. He appears well-developed and well-nourished.  No distress  HENT:  Head: Normocephalic and atraumatic.  Eyes: Conjunctivae are normal. Pupils are equal, round, and reactive to light.  Neck: Normal range of motion. Neck supple.  Stoma closing but still with air leak Cardiovascular:RRR   Respiratory: CTA B    GI: Soft. Bowel sounds are normal. He exhibits no distension. There is no tenderness.  PEG site clean and dry Genitourinary:  Genitourinary Comments: scrotum packed  Musculoskeletal:  B-BKA incisions-necrotic tissue and eschar liquifying. Pink granulation underneath Right MCP site with incision intact and sutures in place, dry, dressed Neurological: He is alert and oriented to person, place, and time. Coordination abnormal.  Speech clear. Reasonable insight and awareness. UE limited by discomfort to an extent. B/L deltoid, biceps, triceps are 4-/5. Wrist and fingers limited by splints and amputations. LE: 4/5 HF, 4/5 KE.   Skin: Scrotal wound with drainage from drain site buttocks  reddened, scarred.    Psychiatric: pleasant and appropriate    Assessment/Plan: 1. Debility and mobility/functional deficits secondary to multiple medical issues, 4 limb amputations which require 3+ hours per day of interdisciplinary therapy in a comprehensive inpatient rehab setting. Physiatrist is providing close team supervision and 24 hour management of active medical problems listed below. Physiatrist and rehab team continue to assess barriers to discharge/monitor patient progress toward functional and medical goals.  Function:  Bathing Bathing position   Position: Shower  Bathing parts Body parts bathed by patient: Right arm, Right upper leg, Left arm, Left upper leg, Chest, Abdomen, Front perineal area Body parts bathed by helper: Buttocks, Back  Bathing assist Assist Level: Touching or steadying assistance(Pt > 75%)      Upper Body Dressing/Undressing Upper body dressing   What is the patient wearing?: Pull over shirt/dress     Pull over shirt/dress - Perfomed by patient: Thread/unthread right sleeve, Thread/unthread left sleeve Pull over shirt/dress - Perfomed by helper: Put head through opening, Pull shirt over trunk        Upper body assist Assist Level: Touching or steadying assistance(Pt > 75%)      Lower Body Dressing/Undressing Lower body dressing   What is the patient wearing?: Pants   Underwear - Performed by helper: Thread/unthread right underwear leg, Thread/unthread left underwear leg, Pull underwear up/down Pants- Performed by patient: Thread/unthread right pants leg, Thread/unthread left pants leg Pants- Performed by helper: Pull pants up/down  Lower body assist Assist for lower body dressing: Touching or steadying assistance (Pt > 75%)      Toileting Toileting Toileting activity did not occur: No continent bowel/bladder event Toileting steps completed by patient: Adjust clothing prior to toileting Toileting steps  completed by helper: Adjust clothing prior to toileting, Performs perineal hygiene, Adjust clothing after toileting    Toileting assist Assist level: More than reasonable time   Transfers Chair/bed transfer   Chair/bed transfer method: Lateral scoot Chair/bed transfer assist level: Moderate assist (Pt 50 - 74%/lift or lower) Chair/bed transfer assistive device: Armrests, Sliding board     Locomotion Ambulation           Wheelchair     Max wheelchair distance: 150 Assist Level: Supervision or verbal cues  Cognition Comprehension Comprehension assist level: Follows complex conversation/direction with extra time/assistive device  Expression Expression assist level: Expresses complex 90% of the time/cues < 10% of the time  Social Interaction Social Interaction assist level: Interacts appropriately with others - No medications needed.  Problem Solving Problem solving assist level: Solves complex problems: With extra time  Memory Memory assist level: Recognizes or recalls 90% of the time/requires cueing < 10% of the time   Medical Problem List and Plan: 1.  Deconditioning and functional/mobility deficits secondary to complications after CABG/AVR with subsequent respiratory failure as well as ischemic injury to all 4 limbs which required bilateral BKA's and bilateral TM-C amputations.              -continue therapies  -continues to be very motivated  -will use a manual w/c for now 2.  RV thrombus/Anticoagulation: Pharmaceutical: Coumadin and Heparin 3. Pain Management: Prn medications effective.  4. Mood: Motivated to get stronger. LCSW to follow for evaluation and support.  5. Neuropsych: This patient is capable of making decisions on his own behalf. 6. Skin/Wound Care: Monitor wounds for healing.     -continue pink tape/occlusive dressing. Stoma closing.  7. Fluids/Electrolytes/Nutrition: good po intake  -PEG out 8. CAD s/p AVR: continue IV heparin till INR therapeutic.On ASA,  Atenolol and Lipitor.  9. A fib: Monitor HR bid. Continue digoxin, atenolol and amiodarone.  10. B-BKA, Left transmetatarsal and right MCP amputations due to DIC:               -continue santyl dressing BLE    -wounds healing nicely 11. 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   -continue scrotal packing---drainage decreasing 12. Diarrhea: generally mproving on lomotil prn 13. Anemia of chronic illness: Slowly improving --re-check 14. T2DM: Monitor BS ac/hs. Continue lantus insulin. Sugars under reasonable control  -SSI 15. HTN: Monitor BP bid--on lisinopril, lasix, digoxin and atenolol  16. Uro: wound care as above  -bladder emptying  LOS (Days) 8 A FACE TO FACE EVALUATION WAS PERFORMED  Meredith Staggers, MD 02/13/2017 8:11 AM

## 2017-02-13 NOTE — Progress Notes (Signed)
Physical Therapy Session Note  Patient Details  Name: Scott [name] MRN: [id number] Date of Birth: [birth year]  Today's Date: 02/13/2017 PT Individual Time: 1000-1100 PT Individual Time Calculation (min): 60 min   Short Term Goals: Week 1:  PT Short Term Goal 1 (Week 1): Patient will maintain sitting balance with supervision assist  PT Short Term Goal 1 - Progress (Week 1): Met PT Short Term Goal 2 (Week 1): patient will perform WC<>bed transfer with max assist consistently PT Short Term Goal 2 - Progress (Week 1): Met PT Short Term Goal 3 (Week 1): Patient will perform bed mobility with min assist form PT.  PT Short Term Goal 3 - Progress (Week 1): Partly met PT Short Term Goal 4 (Week 1): Patient will iniate WC proplusion.  PT Short Term Goal 4 - Progress (Week 1): Met Week 2:  PT Short Term Goal 1 (Week 2): =LTG due to estimated LOS  Skilled Therapeutic Interventions/Progress Updates:   Pt received supine in bed and agreeable to PT. Supine>sit transfer with mod assist and min cues for positioning cues   Family members present to complete education for transfers. SB transfers completed to level surface x 5 with PT and multiple family members. Min assist provided for safety to stabilize board and improve lateral movement across board. Cues for proper board positioning, as well as WC set up and proper head/hips relationship.   Car trasnfer with PT and mod assist and min cues for UE placement and proper head/hips relationship. Also Completed with +2 Max assist with family member providing primary support due to Anterior LOB requiring PT to reposition pt on board to prevent fall. Additional education for importance of proper board placement, set up, and assist to improve success uphill SB transfer.    WC mobility with tband on WC with supervision assist x 129f. Min cues for use for proximal portion of Bilateral hands.   [facility] and all needs met.        Therapy Documentation Precautions:  Precautions Precautions: Fall, Other (comment) Precaution Comments: Left UE transmetacarpal amputation, R UE MCP amputation, bilateral BKA, G tube Restrictions Weight Bearing Restrictions: Yes Vital Signs: Therapy Vitals Pulse Rate: 65   See Function Navigator for Current Functional Status.   Therapy/Group: Individual Therapy  [name]3/24/2018, 2:51 PM

## 2017-02-13 NOTE — Progress Notes (Signed)
Occupational Therapy Session Note  Patient Details  Name: Scott [name] MRN: [id number] Date of Birth: [birth year]  Today's Date: 02/13/2017 OT Individual Time: 1100-1200 OT Individual Time Calculation (min): 60 min    Short Term Goals: Week 2:  OT Short Term Goal 1 (Week 2): STG=TG due to LOS  Skilled Therapeutic Interventions/Progress Updates:    Pt seen with direct handoff from PT in room. Pt with no c/o pain this session. Focus of session on family training for transfers and BADLs. OT educates family members on shearing forces from sliding board and pressure relief, and timing of pressure breaks. Educated family, by demonstrating placing sliding board and completing trasfer from w/c to [facility] with MIN A. On commode, Pt able to lean laterally to advance pants past hips, but require A to advance pants up hips. Family members able to teach back sliding board transfer BSC>w/c and w/c>EOB with VC for safety. Family members able to provide VC to pt for head hip relationship and hand placement. Educated family on hoyer lift transfer supine in bed <> w/c. Educated family on hoyer lift uses for commode and also from floor. Pt able to roll R/L for family to place sling with supervision. Educated family on importance of body mechanics for all transfers and when assisting with boosting in bed, by elevating bed surface. OT demo safe technique/set up supine>w/c transfer with TOTAL A with lift. Family able to teach back technique with VC to lock hoyer prior to lifting pt and transfer pt with lift back to bed. Exited session with family members in room, and pt supine in bed with call light in reach.  Therapy Documentation Precautions:  Precautions Precautions: Fall, Other (comment) Precaution Comments: Left UE transmetacarpal amputation, R UE MCP amputation, bilateral BKA, G tube Restrictions Weight Bearing Restrictions: Yes General:   See Function Navigator for Current Functional  Status.   Therapy/Group: Individual Therapy  Tonny Branch 02/13/2017, 12:28 PM

## 2017-02-14 ENCOUNTER — Inpatient Hospital Stay (HOSPITAL_COMMUNITY): Payer: Medicare Other | Admitting: Physical Therapy

## 2017-02-14 LAB — GLUCOSE, CAPILLARY
GLUCOSE-CAPILLARY: 110 mg/dL — AB (ref 65–99)
Glucose-Capillary: 95 mg/dL (ref 65–99)
Glucose-Capillary: 128 mg/dL — ABNORMAL HIGH (ref 65–99)
Glucose-Capillary: 108 mg/dL — ABNORMAL HIGH (ref 65–99)

## 2017-02-14 LAB — PROTIME-INR
INR: 2.7
Prothrombin Time: 29.2 seconds — ABNORMAL HIGH (ref 11.4–15.2)

## 2017-02-14 MED ORDER — WARFARIN SODIUM 1 MG PO TABS
1.0000 mg | ORAL_TABLET | Freq: Once | ORAL | Status: AC
  Administered 2017-02-14: 1 mg via ORAL
  Filled 2017-02-14: qty 1

## 2017-02-14 NOTE — Progress Notes (Signed)
[place] PHYSICAL MEDICINE & REHABILITATION     PROGRESS NOTE    Subjective/Complaints: No new complaints.   ROS: pt denies nausea, vomiting, diarrhea, cough, shortness of breath or chest pain    Objective: Vital Signs: Blood pressure 101/76, pulse 60, temperature 97.7 F (36.5 C), temperature source Oral, resp. rate 18, weight 71.6 kg (157 lb 12.8 oz), SpO2 99 %. No results found. No results for input(s): WBC, HGB, HCT, PLT in the last 72 hours.  Recent Labs  02/11/17 2009  NA 137  K 3.4*  CL 101  GLUCOSE 122*  BUN 18  CREATININE 1.17  CALCIUM 8.8*   CBG (last 3)   Recent Labs  02/13/17 1637 02/13/17 2103 02/14/17 0639  GLUCAP 118* 202* 95    Wt Readings from Last 3 Encounters:  02/14/17 71.6 kg (157 lb 12.8 oz)  01/31/17 101 kg (222 lb 10.6 oz)  02/04/17 101 kg (222 lb 10.6 oz)    Physical Exam:  Constitutional: He is oriented to person, place, and time. He appears well-developed and well-nourished.  No distress  HENT:  Head: Normocephalic and atraumatic.  Eyes: Conjunctivae are normal. Pupils are equal, round, and reactive to light.  Neck: Normal range of motion. Neck supple.  Stoma closing   Cardiovascular: RRR   Respiratory: CTA B    GI: Soft. Bowel sounds are normal. He exhibits no distension. There is no tenderness.  PEG site clean and dry Genitourinary:  Genitourinary Comments: scrotum packed  Musculoskeletal:  B-BKA incisions-necrotic tissue and eschar liquifying. Pink granulation underneath Right MCP site with incision intact and sutures in place, dry, dressed Neurological: He is alert and oriented to person, place, and time. Coordination abnormal.  Speech clear. Reasonable insight and awareness. UE limited by discomfort to an extent. B/L deltoid, biceps, triceps are 4-/5. Wrist and fingers limited by splints and amputations. LE: 4/5 HF, 4/5 KE.   Skin: Scrotal wound with drainage from drain site buttocks reddened, scarred.     Psychiatric: pleasant and appropriate    Assessment/Plan: 1. Debility and mobility/functional deficits secondary to multiple medical issues, 4 limb amputations which require 3+ hours per day of interdisciplinary therapy in a comprehensive inpatient rehab setting. Physiatrist is providing close team supervision and 24 hour management of active medical problems listed below. Physiatrist and rehab team continue to assess barriers to discharge/monitor patient progress toward functional and medical goals.  Function:  Bathing Bathing position   Position: Shower  Bathing parts Body parts bathed by patient: Right arm, Right upper leg, Left arm, Left upper leg, Chest, Abdomen, Front perineal area Body parts bathed by helper: Buttocks, Back  Bathing assist Assist Level: Touching or steadying assistance(Pt > 75%)      Upper Body Dressing/Undressing Upper body dressing   What is the patient wearing?: Pull over shirt/dress     Pull over shirt/dress - Perfomed by patient: Thread/unthread right sleeve, Thread/unthread left sleeve Pull over shirt/dress - Perfomed by helper: Put head through opening, Pull shirt over trunk        Upper body assist Assist Level: Touching or steadying assistance(Pt > 75%)      Lower Body Dressing/Undressing Lower body dressing   What is the patient wearing?: Pants   Underwear - Performed by helper: Thread/unthread right underwear leg, Thread/unthread left underwear leg, Pull underwear up/down Pants- Performed by patient: Thread/unthread right pants leg, Thread/unthread left pants leg Pants- Performed by helper: Pull pants up/down  Lower body assist Assist for lower body dressing: Touching or steadying assistance (Pt > 75%)      Toileting Toileting Toileting activity did not occur: No continent bowel/bladder event (simulated for family education) Toileting steps completed by patient: Adjust clothing prior to  toileting Toileting steps completed by helper: Performs perineal hygiene    Toileting assist Assist level:  (MAX A)   Transfers Chair/bed transfer   Chair/bed transfer method: Lateral scoot Chair/bed transfer assist level: Touching or steadying assistance (Pt > 75%) Chair/bed transfer assistive device: Sliding board, Armrests     Locomotion Ambulation           Wheelchair   Type: Manual Max wheelchair distance: 123ft  Assist Level: Supervision or verbal cues  Cognition Comprehension Comprehension assist level: Follows complex conversation/direction with extra time/assistive device  Expression Expression assist level: Expresses complex 90% of the time/cues < 10% of the time  Social Interaction Social Interaction assist level: Interacts appropriately with others - No medications needed.  Problem Solving Problem solving assist level: Solves complex problems: With extra time  Memory Memory assist level: Recognizes or recalls 90% of the time/requires cueing < 10% of the time   Medical Problem List and Plan: 1.  Deconditioning and functional/mobility deficits secondary to complications after CABG/AVR with subsequent respiratory failure as well as ischemic injury to all 4 limbs which required bilateral BKA's and bilateral TM-C amputations.              -continue therapies  -continues to be very motivated  -will use a manual w/c for now and make decisions about prosthetics as outpt 2.  RV thrombus/Anticoagulation: Pharmaceutical: Coumadin and Heparin 3. Pain Management: Prn medications effective.  4. Mood: Motivated to get stronger. LCSW to follow for evaluation and support.  5. Neuropsych: This patient is capable of making decisions on his own behalf. 6. Skin/Wound Care: Monitor wounds for healing.     -continue pink tape/occlusive dressing. Stoma closing.  7. Fluids/Electrolytes/Nutrition: good po intake  -PEG out and site healing 8. CAD s/p AVR: continue IV heparin till INR  therapeutic.On ASA, Atenolol and Lipitor.  9. A fib: Monitor HR bid. Continue digoxin, atenolol and amiodarone.  10. B-BKA, Left transmetatarsal and right MCP amputations due to DIC:               -continue santyl dressing BLE    -wounds healing nicely 11. 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   -continue scrotal packing---drainage decreasing 12. Diarrhea: generally mproving on lomotil prn 13. Anemia of chronic illness: Slowly improving --re-check 14. T2DM: Monitor BS ac/hs. Continue lantus insulin. Sugars under reasonable control  -SSI 15. HTN: Monitor BP bid--on lisinopril, lasix, digoxin and atenolol  16. Uro: wound care as above  -bladder emptying  LOS (Days) 9 A FACE TO FACE EVALUATION WAS PERFORMED  Meredith Staggers, MD 02/14/2017 8:58 AM

## 2017-02-14 NOTE — Progress Notes (Signed)
ANTICOAGULATION CONSULT NOTE - Follow Up Consult  Pharmacy Consult for coumadin Indication: afib / RV thrombus  Allergies  Allergen Reactions  . Doxycycline Hives  . Penicillins Hives     Has patient had a PCN reaction causing immediate rash, facial/tongue/throat swelling, SOB or lightheadedness with hypotension: No Has patient had a PCN reaction causing severe rash involving mucus membranes or skin necrosis: No Has patient had a PCN reaction that required hospitalization: No Has patient had a PCN reaction occurring within the last 10 years:# # # YES # # #  If all of the above answers are "NO", then may proceed with Cephalosporin use.   Scott [name] Antibiotics Hives    Patient Measurements: Weight: 157 lb 12.8 oz (71.6 kg)  Vital Signs: Temp: 97.7 F (36.5 C) (03/25 0500) Temp Source: Oral (03/25 0500) BP: 101/76 (03/25 0500) Pulse Rate: 60 (03/25 0500)  Labs:  Recent Labs  02/11/17 2009 02/12/17 0556 02/13/17 0529 02/14/17 0540  LABPROT  --  33.4* 31.1* 29.2*  INR  --  3.20 2.92 2.70  CREATININE 1.17  --   --   --     Estimated Creatinine Clearance: 58.7 mL/min (by C-G formula based on SCr of 1.17 mg/dL).   Assessment: 71 yo male with afib / RV thrombus on warfarin with previously supratherapeutic INR while on 5mg  daily in the previous week.  INR today has decreased to 2.70 today and is therapeutic  Goal of Therapy:  INR 2-3 Monitor platelets by anticoagulation protocol: Yes   Plan:  - Coumadin 1 mg po x 1 - Daily INR - Monitor CBC, S/Sx bleeding  Scott [name]), PharmD  [id number] Pharmacy Resident Pager: [phone] 02/14/2017 9:12 AM

## 2017-02-14 NOTE — Progress Notes (Signed)
Progress Note  Patient Name: Scott [name] Date of Encounter: 02/14/2017  Primary Cardiologist: Dr. Percival Spanish  Subjective   He is in great spirits and denies chest pain or SOB.   Inpatient Medications    Scheduled Meds: . amiodarone  100 mg Oral Daily  . aspirin EC  81 mg Oral Daily  . atenolol  25 mg Oral Daily  . atorvastatin  40 mg Oral q1800  . ciprofloxacin  500 mg Oral BID  . collagenase   Topical Daily  . digoxin  0.125 mg Oral Daily  . famotidine  20 mg Oral BID  . ferrous sulfate  325 mg Oral BID WC  . furosemide  40 mg Oral BID  . gabapentin  300 mg Oral BID  . hydrocerin   Topical BID  . insulin aspart  0-5 Units Subcutaneous QHS  . insulin aspart  0-9 Units Subcutaneous TID WC  . insulin glargine  5 Units Subcutaneous BID  . lisinopril  2.5 mg Oral Daily  . Melatonin  3 mg Oral QHS  . potassium chloride  20 mEq Oral Daily  . rOPINIRole  2 mg Oral QHS  . sertraline  75 mg Oral Daily  . vitamin C  500 mg Oral Daily  . warfarin  1 mg Oral ONCE-1800  . Warfarin - Pharmacist Dosing Inpatient   Does not apply q1800  . zinc sulfate  220 mg Oral BID  . zolpidem  5 mg Oral QHS   Continuous Infusions:  PRN Meds: acetaminophen, alum & mag hydroxide-simeth, bisacodyl, diphenhydrAMINE, diphenoxylate-atropine, guaiFENesin-dextromethorphan, ipratropium-albuterol, oxyCODONE, polyethylene glycol, prochlorperazine **OR** prochlorperazine **OR** prochlorperazine, sodium phosphate   Vital Signs    Vitals:   02/13/17 0900 02/13/17 1206 02/13/17 1300 02/14/17 0500  BP: 94/67  98/67 101/76  Pulse: (!) 51 65 67 60  Resp:   16 18  Temp:   97.8 F (36.6 C) 97.7 F (36.5 C)  TempSrc:   Oral Oral  SpO2:   98% 99%  Weight:    157 lb 12.8 oz (71.6 kg)    Intake/Output Summary (Last 24 hours) at 02/14/17 1152 Last data filed at 02/13/17 2230  Gross per 24 hour  Intake             1000 ml  Output              500 ml  Net              500 ml   Filed Weights   02/12/17 0458 02/13/17 0300 02/14/17 0500  Weight: 156 lb (70.8 kg) 160 lb 8 oz (72.8 kg) 157 lb 12.8 oz (71.6 kg)    Telemetry    NA - Personally Reviewed  ECG    NA - Personally Reviewed  Physical Exam   GEN: No acute distress.  No  Neck: No  JVD Cardiac: RRR, 3/6 apical systolic murmur radiating out the outflow tract and early peaking. No diastolic murmurs.  Respiratory: Clear  to auscultation bilaterally. GI: Soft, nontender, non-distended  MS: No  edema; No deformity. Neuro:  Nonfocal  Psych: Normal affect   Labs    Chemistry Recent Labs Lab 02/11/17 0502 02/11/17 2009  NA 139 137  K 3.1* 3.4*  CL 103 101  CO2 25 26  GLUCOSE 105* 122*  BUN 16 18  CREATININE 0.97 1.17  CALCIUM 8.6* 8.8*  GFRNONAA >60 >60  GFRAA >60 >60  ANIONGAP 11 10     HematologyNo results for input(s): WBC,  RBC, HGB, HCT, MCV, MCH, MCHC, RDW, PLT in the last 168 hours.  Cardiac EnzymesNo results for input(s): TROPONINI in the last 168 hours. No results for input(s): TROPIPOC in the last 168 hours.   BNPNo results for input(s): BNP, PROBNP in the last 168 hours.   DDimer No results for input(s): DDIMER in the last 168 hours.   Radiology    No results found.  Cardiac Studies   ECHO:  Pending  Patient Profile     71 y.o. male s/p AVR, mitral repair, CABG with resultant cardiogenic shock and ischemic limbs secondary to high dose pressors.  He is nearing the end of a continuous hospitalization that started in Nov.   Assessment & Plan    AS/MR:  We have ordered an echo to follow up on his valves.    ATRIAL FIB:  Warfarin per pharmacy.  Meds reviewed.  Continue with amiodarone.  Check EKG.   Signed, Minus Breeding, MD  02/14/2017, 11:52 AM

## 2017-02-14 NOTE — Progress Notes (Signed)
Physical Therapy Session Note  Patient Details  Name: Scott [name] MRN: [id number] Date of Birth: [birth year]  Today's Date: 02/14/2017 PT Individual Time: 1300-1400 PT Individual Time Calculation (min): 60 min   Short Term Goals: Week 2:  PT Short Term Goal 1 (Week 2): =LTG due to estimated LOS  Skilled Therapeutic Interventions/Progress Updates: Pt received semi-reclined in bed on bedpan; denies pain and agreeable to treatment. Rolling R/L with S while therapist performed hygiene and brief management totalA after continent bowel/bladder void. While performing hygiene, therapist discussed with pt power vs manual w/c options to determine what pt had discussed with family; pt reports his wife said they would look into getting a lift for the vehicle if that's what he wanted but he reports he much prefers to have a manual chair that will be easier for family to manage. Discussed long term plan to use prosthetics; educated pt in high energy expenditure with ambulating using prosthetics and likely need for platform RW to A with gait training d/t BUE amputations further increasing energy demands, and discussed that pt will likely continue to need w/c for community mobility even after he receives and begins training for prosthetics. Pt dons shorts in supine with rolling/bridging with minA for pulling pants over L hip. Shirt donned with setupA and increased time. ModA supine>sit. W/c propulsion x150' with BUE for strengthening and endurance. UE ergometer at tabletop with BUEs ace wrapped onto handles, 5 min total. Educated pt in importance of improving cardiovascular endurance following cardiac event for carryover into w/c propulsion and eventual gait training. W/c propulsion x100' to return to room; therapist pushed remaining distance for energy conservation. Remained seated in w/c at end of session, all needs in reach.      Therapy Documentation Precautions:  Precautions Precautions: Fall, Other  (comment) Precaution Comments: Left UE transmetacarpal amputation, R UE MCP amputation, bilateral BKA, G tube Restrictions Weight Bearing Restrictions: Yes   See Function Navigator for Current Functional Status.   Therapy/Group: Individual Therapy  Luberta Mutter 02/14/2017, 1:58 PM

## 2017-02-15 ENCOUNTER — Inpatient Hospital Stay (HOSPITAL_COMMUNITY): Payer: Medicare Other | Admitting: Physical Therapy

## 2017-02-15 ENCOUNTER — Inpatient Hospital Stay (HOSPITAL_COMMUNITY): Payer: Medicare Other

## 2017-02-15 ENCOUNTER — Inpatient Hospital Stay (HOSPITAL_COMMUNITY): Payer: Medicare Other | Admitting: Occupational Therapy

## 2017-02-15 ENCOUNTER — Inpatient Hospital Stay (HOSPITAL_COMMUNITY): Payer: Medicare Other | Admitting: *Deleted

## 2017-02-15 ENCOUNTER — Inpatient Hospital Stay (HOSPITAL_COMMUNITY): Payer: Medicare Other | Admitting: Speech Pathology

## 2017-02-15 DIAGNOSIS — I350 Nonrheumatic aortic (valve) stenosis: Secondary | ICD-10-CM

## 2017-02-15 LAB — ECHOCARDIOGRAM COMPLETE
HEIGHTINCHES: 60 in
WEIGHTICAEL: 2518.54 [oz_av]

## 2017-02-15 LAB — PROTIME-INR
INR: 2.25
PROTHROMBIN TIME: 25.2 s — AB (ref 11.4–15.2)

## 2017-02-15 LAB — GLUCOSE, CAPILLARY
GLUCOSE-CAPILLARY: 113 mg/dL — AB (ref 65–99)
Glucose-Capillary: 94 mg/dL (ref 65–99)
Glucose-Capillary: 122 mg/dL — ABNORMAL HIGH (ref 65–99)
Glucose-Capillary: 124 mg/dL — ABNORMAL HIGH (ref 65–99)

## 2017-02-15 LAB — CBC
HCT: 36 % — ABNORMAL LOW (ref 39.0–52.0)
Hemoglobin: 10.6 g/dL — ABNORMAL LOW (ref 13.0–17.0)
MCH: 24.9 pg — AB (ref 26.0–34.0)
MCHC: 29.4 g/dL — ABNORMAL LOW (ref 30.0–36.0)
MCV: 84.5 fL (ref 78.0–100.0)
PLATELETS: 249 10*3/uL (ref 150–400)
RBC: 4.26 MIL/uL (ref 4.22–5.81)
RDW: 19 % — ABNORMAL HIGH (ref 11.5–15.5)
WBC: 7.1 10*3/uL (ref 4.0–10.5)

## 2017-02-15 MED ORDER — WARFARIN SODIUM 5 MG PO TABS
5.0000 mg | ORAL_TABLET | Freq: Once | ORAL | Status: AC
  Administered 2017-02-15: 5 mg via ORAL
  Filled 2017-02-15: qty 1

## 2017-02-15 NOTE — Progress Notes (Signed)
[facility] PHYSICAL MEDICINE & REHABILITATION     PROGRESS NOTE    Subjective/Complaints: Feeling well. Continues to progress. Denies pain.   ROS: pt denies nausea, vomiting, diarrhea, cough, shortness of breath or chest pain     Objective: Vital Signs: Blood pressure 115/72, pulse 66, temperature 98 F (36.7 C), temperature source Oral, resp. rate 16, height 5' (1.524 m), weight 71.4 kg (157 lb 6.5 oz), SpO2 98 %. No results found.  Recent Labs  02/15/17 0717  WBC 7.1  HGB 10.6*  HCT 36.0*  PLT 249   No results for input(s): NA, K, CL, GLUCOSE, BUN, CREATININE, CALCIUM in the last 72 hours.  Invalid input(s): CO CBG (last 3)   Recent Labs  02/14/17 1634 02/14/17 2114 02/15/17 0642  GLUCAP 108* 110* 94    Wt Readings from Last 3 Encounters:  02/15/17 71.4 kg (157 lb 6.5 oz)  01/31/17 101 kg (222 lb 10.6 oz)  02/04/17 101 kg (222 lb 10.6 oz)    Physical Exam:  Constitutional: He is oriented to person, place, and time. He appears well-developed and well-nourished.  No distress  HENT:  Head: Normocephalic and atraumatic.  Eyes: Conjunctivae are normal. Pupils are equal, round, and reactive to light.  Neck: Normal range of motion. Neck supple.  Stoma closing but continues to leak air Cardiovascular: RRR   Respiratory: CTA B    GI: Soft. Bowel sounds are normal. He exhibits no distension. There is no tenderness.  PEG site clean with central granulation Genitourinary:  Genitourinary Comments: scrotum packed/stable  Musculoskeletal:  B-BKA incisions-necrotic tissue and eschar liquifying. Pink granulation underneath Right MCP site with incision intact and sutures in place, dry, dressed Neurological: He is alert and oriented to person, place, and time. Coordination abnormal.  Speech clear. Reasonable insight and awareness. UE limited by discomfort to an extent. B/L deltoid, biceps, triceps are 4/5. Wrist and fingers limited by splints and amputations. LE: 4/5  HF, 4/5 KE.   Skin: Scrotal wound with drainage from drain site buttocks reddened, scarred.    Psychiatric: pleasant and appropriate    Assessment/Plan: 1. Debility and mobility/functional deficits secondary to multiple medical issues, 4 limb amputations which require 3+ hours per day of interdisciplinary therapy in a comprehensive inpatient rehab setting. Physiatrist is providing close team supervision and 24 hour management of active medical problems listed below. Physiatrist and rehab team continue to assess barriers to discharge/monitor patient progress toward functional and medical goals.  Function:  Bathing Bathing position   Position: Shower  Bathing parts Body parts bathed by patient: Right arm, Right upper leg, Left arm, Left upper leg, Chest, Abdomen, Front perineal area Body parts bathed by helper: Buttocks, Back  Bathing assist Assist Level: Touching or steadying assistance(Pt > 75%)      Upper Body Dressing/Undressing Upper body dressing   What is the patient wearing?: Pull over shirt/dress     Pull over shirt/dress - Perfomed by patient: Thread/unthread left sleeve, Put head through opening, Thread/unthread right sleeve, Pull shirt over trunk Pull over shirt/dress - Perfomed by helper: Put head through opening, Pull shirt over trunk        Upper body assist Assist Level: Supervision or verbal cues, Set up   Set up : To obtain clothing/put away  Lower Body Dressing/Undressing Lower body dressing   What is the patient wearing?: Pants   Underwear - Performed by helper: Thread/unthread right underwear leg, Thread/unthread left underwear leg, Pull underwear up/down Pants- Performed by patient: Thread/unthread right  pants leg, Thread/unthread left pants leg, Pull pants up/down Pants- Performed by helper: Pull pants up/down                      Lower body assist Assist for lower body dressing: Touching or steadying assistance (Pt > 75%)       Toileting Toileting Toileting activity did not occur: No continent bowel/bladder event (simulated for family education) Toileting steps completed by patient: Adjust clothing prior to toileting Toileting steps completed by helper: Performs perineal hygiene    Toileting assist Assist level:  (MAX A)   Transfers Chair/bed transfer   Chair/bed transfer method: Lateral scoot Chair/bed transfer assist level: Touching or steadying assistance (Pt > 75%) Chair/bed transfer assistive device: Sliding board, Armrests     Locomotion Ambulation           Wheelchair   Type: Manual Max wheelchair distance: 150 Assist Level: Supervision or verbal cues  Cognition Comprehension Comprehension assist level: Follows complex conversation/direction with extra time/assistive device  Expression Expression assist level: Expresses complex 90% of the time/cues < 10% of the time  Social Interaction Social Interaction assist level: Interacts appropriately with others - No medications needed.  Problem Solving Problem solving assist level: Solves complex problems: With extra time  Memory Memory assist level: Recognizes or recalls 90% of the time/requires cueing < 10% of the time   Medical Problem List and Plan: 1.  Deconditioning and functional/mobility deficits secondary to complications after CABG/AVR with subsequent respiratory failure as well as ischemic injury to all 4 limbs which required bilateral BKA's and bilateral TM-C amputations.              -continue therapies  -will use a manual w/c for now and make decisions about prosthetics as outpt 2.  RV thrombus/Anticoagulation: Pharmaceutical: Coumadin   3. Pain Management: Prn medications effective.  4. Mood: Motivated to get stronger. LCSW to follow for evaluation and support.  5. Neuropsych: This patient is capable of making decisions on his own behalf. 6. Skin/Wound Care: Monitor wounds for healing.     -continue pink tape/occlusive dressing. Stoma  closing.  7. Fluids/Electrolytes/Nutrition: good po intake  -PEG out and site healing with granulation 8. CAD s/p AVR: continue IV heparin till INR therapeutic.On ASA, Atenolol and Lipitor.  9. A fib: Monitor HR bid. Continue digoxin, atenolol and amiodarone.  10. B-BKA, Left transmetatarsal and right MCP amputations due to DIC:               -continue santyl dressing BLE    -wounds healing nicely 11. 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 Diarrhea: generally mproving on lomotil prn 13. Anemia of chronic illness: Slowly improving --re-check hgb today 10.6 14. T2DM: Monitor BS ac/hs. Continue lantus insulin. Sugars under reasonable control  -SSI 15. HTN: Monitor BP bid--on lisinopril, lasix, digoxin and atenolol  16. Uro: wound care as above  -bladder emptying  LOS (Days) 10 A FACE TO FACE EVALUATION WAS PERFORMED  Meredith Staggers, MD 02/15/2017 9:58 AM

## 2017-02-15 NOTE — Progress Notes (Signed)
ANTICOAGULATION CONSULT NOTE - Follow Up Consult  Pharmacy Consult for coumadin Indication: atrial fibrillation/RV thrombus  Allergies  Allergen Reactions  . Doxycycline Hives  . Penicillins Hives     Has patient had a PCN reaction causing immediate rash, facial/tongue/throat swelling, SOB or lightheadedness with hypotension: No Has patient had a PCN reaction causing severe rash involving mucus membranes or skin necrosis: No Has patient had a PCN reaction that required hospitalization: No Has patient had a PCN reaction occurring within the last 10 years:# # # YES # # #  If all of the above answers are "NO", then may proceed with Cephalosporin use.   . Sulfa Antibiotics Hives    Patient Measurements: Height: 5' (152.4 cm) (estimate) Weight: 157 lb 6.5 oz (71.4 kg) IBW/kg (Calculated) : 50 Heparin Dosing Weight:   Vital Signs: Temp: 98 F (36.7 C) (03/26 0611) Temp Source: Oral (03/26 0611) BP: 115/72 (03/26 0611) Pulse Rate: 66 (03/26 0611)  Labs:  Recent Labs  02/13/17 0529 02/14/17 0540 02/15/17 0717  HGB  --   --  10.6*  HCT  --   --  36.0*  PLT  --   --  249  LABPROT 31.1* 29.2*  --   INR 2.92 2.70  --     Estimated Creatinine Clearance: 48.7 mL/min (by C-G formula based on SCr of 1.17 mg/dL).   Medications:  Scheduled:  . amiodarone  100 mg Oral Daily  . aspirin EC  81 mg Oral Daily  . atenolol  25 mg Oral Daily  . atorvastatin  40 mg Oral q1800  . collagenase   Topical Daily  . digoxin  0.125 mg Oral Daily  . famotidine  20 mg Oral BID  . ferrous sulfate  325 mg Oral BID WC  . furosemide  40 mg Oral BID  . gabapentin  300 mg Oral BID  . hydrocerin   Topical BID  . insulin aspart  0-5 Units Subcutaneous QHS  . insulin aspart  0-9 Units Subcutaneous TID WC  . insulin glargine  5 Units Subcutaneous BID  . lisinopril  2.5 mg Oral Daily  . Melatonin  3 mg Oral QHS  . potassium chloride  20 mEq Oral Daily  . rOPINIRole  2 mg Oral QHS  . sertraline   75 mg Oral Daily  . vitamin C  500 mg Oral Daily  . Warfarin - Pharmacist Dosing Inpatient   Does not apply q1800  . zinc sulfate  220 mg Oral BID  . zolpidem  5 mg Oral QHS   Infusions:    Assessment: 71 yo male with afib and RV thrombus is currently on therapeutic coumadin.  INR today is 2.25 from 2.7.  Patient is now off cipro (last dose evening of 02/14/17)  Goal of Therapy:  INR 2-3 Monitor platelets by anticoagulation protocol: Yes   Plan:  - coumadin 5 mg po x1 - INR in am  Faylinn Schwenn, Tsz-Yin 02/15/2017,8:16 AM

## 2017-02-15 NOTE — Progress Notes (Signed)
Occupational Therapy Session Note  Patient Details  Name: Scott [name] MRN: [id number] Date of Birth: June 13, 1946  Today's Date: 02/15/2017 OT Individual Time: 0800-0900 OT Individual Time Calculation (min): 60 min    Short Term Goals: Week 2:  OT Short Term Goal 1 (Week 2): STG=TG due to LOS  Skilled Therapeutic Interventions/Progress Updates:    Pt seen for OT session focusing on ADL re-training and discussion of d/c planning.  Pt sitting EOB upon arrival finishing up breakfast. He requiring intermittent assist with use of universal cuff and utensil management, hwoever, able to self feed independently following set-up. Worked on adapting dressing stick for increased grasping ability. Koban and built up gripper around dressing stick. Attempted to use while donning pants in bed, however, proved to be more difficulty to manage vs using R thumb to assist with pulling pants up. He rolled and with VCs for technique, pulled pants up, assist to pull pants up entirely in back.  He completed min A sliding board transfer into w/c from slightly elevated bed. Min VCs for head/hips relationship and hand placement on armrests.  Grooming completed at sink with set-up assist using universal cuff to complete oral hygiene. Pt provided with bathing mit for increase independence/ ease with grooming tasks. Pt left seated in w/c at end of session completing grooming tasks, RN made aware of pt's position.   Discussed at length d/c planning and family ed from other the weekend. Pt voiced family ed went really well and pt eager to return home. Will discuss with pt's wife and other members of medical team if d/c can be moved up as pt eager to return home and voicing feeling ready.    Therapy Documentation Precautions:  Precautions Precautions: Fall, Other (comment) Precaution Comments: Left UE transmetacarpal amputation, R UE MCP amputation, bilateral BKA, G tube Restrictions Weight Bearing Restrictions:  Yes Pain:   No/ denies pain ADL: ADL ADL Comments: Please see functional navigator for ADL status  See Function Navigator for Current Functional Status.   Therapy/Group: Individual Therapy  Lewis, Zoye Chandra C 02/15/2017, 6:40 AM

## 2017-02-15 NOTE — Progress Notes (Signed)
Physical Therapy Session Note  Patient Details  Name: Scott [name] MRN: [id number] Date of Birth: 1946/03/29  Today's Date: 02/15/2017 PT Individual Time: [phone] PT Individual Time Calculation (min): 47 min   Short Term Goals: Week 2:  PT Short Term Goal 1 (Week 2): =LTG due to estimated LOS  Skilled Therapeutic Interventions/Progress Updates:    Pt in bed upon arrival with daughter and spouse present. Bed Mobility: supine>sitting performed with HOB elevated and use of rail, mod assist provided a trunk to fully sit at edge. Transfers: bed>w/c performed with pt providing instructions to daughter for setup. Cues provided by PT as needed. Repeated transfer in gym w/c<>mat table (level). Emphasis placed on angle of w/c to avoid running into wheel. Improved sequence by pt and daughter with corrections made. Min assist provided with PT performing transfer. W/C: pt propelling to gym from room with supervision, occasionally drifting Lt. Reviewed parts management with pt and family. Following session pt in room with family and staff to discuss equipment needs.   Therapy Documentation Precautions:  Precautions Precautions: Fall, Other (comment) Precaution Comments: Left UE transmetacarpal amputation, R UE MCP amputation, bilateral BKA, G tube Restrictions Weight Bearing Restrictions: Yes Pain:  Denies pain.  See Function Navigator for Current Functional Status.   Therapy/Group: Individual Therapy  Linard Millers, PT 02/15/2017, 3:55 PM

## 2017-02-15 NOTE — Progress Notes (Signed)
Speech Language Pathology Daily Session Note  Patient Details  Name: Scott [name] MRN: [id number] Date of Birth: 03/11/1946  Today's Date: 02/15/2017 SLP Individual Time: 0930-1000 SLP Individual Time Calculation (min): 30 min  Short Term Goals: Week 2: SLP Short Term Goal 1 (Week 2): Patient will apply pressure to stoma during verbal expression to maximize vocal quality and overall speech intelligibility at the conversation level to 100% with Mod I.  SLP Short Term Goal 2 (Week 2): Patient will recall new, daily information with utilization of memory compensatory strategies and external aids with Mod I.   Skilled Therapeutic Interventions: Skilled treatment session focused on addressing cognition goals. SLP facilitated session by providing requesting patient recall and verbally walk SLP through procedures for tipping as a means for pressure relief.  Patient required Min faded to Supervision level question cues due to him being in a newer chair that he was less familiar with.   However, patient was able to recall specific times for tasks.  Recommend more practice with patient and spouse.  Patient also asking good questions related to discharge planning that were handed off to Rec. Therapist at end of session.  Continue with current plan of care.    Function:   Cognition Comprehension Comprehension assist level: Follows complex conversation/direction with extra time/assistive device  Expression   Expression assist level: Expresses complex 90% of the time/cues < 10% of the time  Social Interaction Social Interaction assist level: Interacts appropriately with others - No medications needed.  Problem Solving Problem solving assist level: Solves complex problems: With extra time  Memory Memory assist level: Recognizes or recalls 75 - 89% of the time/requires cueing 10 - 24% of the time    Pain Pain Assessment Pain Assessment: No/denies pain  Therapy/Group: Individual Therapy  Carmelia Roller., CCC-SLP [phone]  [place] 02/15/2017, 10:52 AM

## 2017-02-15 NOTE — Progress Notes (Signed)
Recreational Therapy Session Note  Patient Details  Name: Scott [name] MRN: [id number] Date of Birth: 1946-07-23 Today's Date: 02/15/2017  Pain: no c/o Skilled Therapeutic Interventions/Progress Updates: Pt propelled w/c from room to therapy gym using BUE's with supervision.  Once in the gym, pt'd daughter arrived to visit/observe therapy.  Session then focused on family education with hands on training for slide board transfers x3, pt needing min assist.  Pt directing daughter as to how to set up transfer and how best to assist him.  Education/mod cuing explaining safety concerns and best technique provided by PT and LRT.  PT noticed that pts brief was wet, pt unaware and returned to room for hygiene & brief change.  Brief changed with Total assist by PT as pt did not want daughter to assist with this , stating wife would help him manage this task at home.  Education to daughter throughout session about energy conservation, pressure relieving measures, equipment ordering, follow up therapies and community pursuits. Daughter stating understanding & willingness to assist pt in any way needed now and at discharge.  Daughter also stating wife to arrive later this afternoon with many questions/concerns to ask of treatment team if team members are available. Therapy/Group: Co-Treatment  Gatlin Kittell 02/15/2017, 3:24 PM

## 2017-02-15 NOTE — Progress Notes (Addendum)
Physical Therapy Session Note  Patient Details  Name: Scott [name] MRN: [id number] Date of Birth: [birth year]  Today's Date: 02/15/2017 PT Individual Time: 1000-1105 and 1530-1600 PT Individual Time Calculation (min): 65 min and 30 min (total 95 min)   Short Term Goals: Week 2:  PT Short Term Goal 1 (Week 2): =LTG due to estimated LOS  Skilled Therapeutic Interventions/Progress Updates:Tx 1: Pt received seated in w/c with handoff from SLP; denies pain and agreeable to treatment. W/c propulsion throughout session with BUEs and S. Daughter present for session for hands-on training. Pt instructed daughter in how to assist with setup and performance of slideboard transfer w/c <>mat table and w/c >bed. Required mod cueing for board placement, removal of leg rests. Daughter provided minA overall for all three transfers. Pt with incontinent bladder accident; unaware of accident and that brief was wet, and then when therapist changed brief pt reporting "I was sweating a lot, maybe that's what happened". Brief changed totalA by therapist d/t pt declining to have daughter assist; reports that he plans to have wife performing any assist with toileting but feels confident to direct care if he were to have an accident with wife not present. Pt remained supine in bed at end of session with brief intact, waiting for RN to pack wound prior to performing lower body dressing. While daughter present throughout session, educated regarding energy conservation, manual vs power chair, equipment needs for home. Pt's daughter reports pt's wife will be present later in the day and has many questions she would like to review; therapist will attempt to return later in the day to discuss with family.   Tx 2: Session with focus on family education and d/c planning with pt, daughter and pt's wife present. Discussed benefits to power vs manual chair, other equipment needed for d/c. Plan to order 16x18 standard light weight w/c with  amputee/regular leg rests, specialty w/c cushion and back for pressure relief and positioning. Also discussed ability to practice with new chair for several days prior to d/c, as well as practicing car transfers. Pt and family to follow up with any further questions; CSW alerted to equipment needs.     Therapy Documentation Precautions:  Precautions Precautions: Fall, Other (comment) Precaution Comments: Left UE transmetacarpal amputation, R UE MCP amputation, bilateral BKA Restrictions Weight Bearing Restrictions: Yes Pain: Pain Assessment Pain Assessment: No/denies pain   See Function Navigator for Current Functional Status.   Therapy/Group: Individual Therapy  Luberta Mutter 02/15/2017, 12:20 PM

## 2017-02-16 ENCOUNTER — Inpatient Hospital Stay (HOSPITAL_COMMUNITY): Payer: Medicare Other | Admitting: Physical Therapy

## 2017-02-16 ENCOUNTER — Inpatient Hospital Stay (HOSPITAL_COMMUNITY): Payer: Medicare Other | Admitting: Speech Pathology

## 2017-02-16 ENCOUNTER — Telehealth (INDEPENDENT_AMBULATORY_CARE_PROVIDER_SITE_OTHER): Payer: Self-pay | Admitting: *Deleted

## 2017-02-16 ENCOUNTER — Inpatient Hospital Stay (HOSPITAL_COMMUNITY): Payer: Medicare Other | Admitting: Occupational Therapy

## 2017-02-16 LAB — GLUCOSE, CAPILLARY
GLUCOSE-CAPILLARY: 113 mg/dL — AB (ref 65–99)
GLUCOSE-CAPILLARY: 155 mg/dL — AB (ref 65–99)
Glucose-Capillary: 101 mg/dL — ABNORMAL HIGH (ref 65–99)
Glucose-Capillary: 109 mg/dL — ABNORMAL HIGH (ref 65–99)

## 2017-02-16 LAB — PROTIME-INR
INR: 2.28
Prothrombin Time: 25.5 seconds — ABNORMAL HIGH (ref 11.4–15.2)

## 2017-02-16 MED ORDER — LORATADINE 10 MG PO TABS
10.0000 mg | ORAL_TABLET | Freq: Every day | ORAL | Status: DC
  Administered 2017-02-16 – 2017-02-19 (×4): 10 mg via ORAL
  Filled 2017-02-16 (×4): qty 1

## 2017-02-16 MED ORDER — WARFARIN SODIUM 2.5 MG PO TABS
2.5000 mg | ORAL_TABLET | Freq: Once | ORAL | Status: AC
Start: 2017-02-16 — End: 2017-02-16
  Administered 2017-02-16: 2.5 mg via ORAL
  Filled 2017-02-16: qty 1

## 2017-02-16 MED ORDER — PSEUDOEPHEDRINE HCL ER 120 MG PO TB12
120.0000 mg | ORAL_TABLET | Freq: Two times a day (BID) | ORAL | Status: AC
  Administered 2017-02-16 – 2017-02-18 (×4): 120 mg via ORAL
  Filled 2017-02-16 (×4): qty 1

## 2017-02-16 MED ORDER — PSEUDOEPHEDRINE HCL ER 120 MG PO TB12: 120.0000 mg | ORAL_TABLET | Freq: Two times a day (BID) | ORAL | Status: DC

## 2017-02-16 MED ORDER — FLUTICASONE PROPIONATE 50 MCG/ACT NA SUSP
1.0000 | Freq: Every day | NASAL | Status: DC
  Administered 2017-02-16 – 2017-02-18 (×3): 1 via NASAL
  Filled 2017-02-16: qty 16

## 2017-02-16 NOTE — Progress Notes (Signed)
Speech Language Pathology Daily Session Note  Patient Details  Name: Scott [name] MRN: [id number] Date of Birth: 27-Jun-1946  Today's Date: 02/16/2017 SLP Individual Time: 0730-0830 SLP Individual Time Calculation (min): 60 min  Short Term Goals: Week 2: SLP Short Term Goal 1 (Week 2): Patient will apply pressure to stoma during verbal expression to maximize vocal quality and overall speech intelligibility at the conversation level to 100% with Mod I.  SLP Short Term Goal 2 (Week 2): Patient will recall new, daily information with utilization of memory compensatory strategies and external aids with Mod I.   Skilled Therapeutic Interventions: Skilled treatment session focused on cognitive goals. SLP facilitated session by providing supervision verbal cues for patient to apply pressure to stoma to maximize vocall intensity to achieve 100% intelligibility at the conversation level. Patient was Mod I for directing care in regards to tray set-up and donning universal cuff and for recall of functional information. Patient left upright sitting EOB with all needs within reach. Continue with current plan of care.      Function:  Cognition Comprehension Comprehension assist level: Follows complex conversation/direction with extra time/assistive device  Expression   Expression assist level: Expresses complex 90% of the time/cues < 10% of the time  Social Interaction Social Interaction assist level: Interacts appropriately with others - No medications needed.  Problem Solving Problem solving assist level: Solves complex problems: With extra time  Memory Memory assist level: Recognizes or recalls 90% of the time/requires cueing < 10% of the time    Pain No/Denies Pain   Therapy/Group: Individual Therapy  Gates Jividen 02/16/2017, 2:35 PM

## 2017-02-16 NOTE — Progress Notes (Signed)
Occupational Therapy Session Note  Patient Details  Name: Scott [name] MRN: [id number] Date of Birth: [birth year]  Today's Date: 02/16/2017 OT Individual Time: 1000-1105 OT Individual Time Calculation (min): 65 min    Short Term Goals: Week 2:  OT Short Term Goal 1 (Week 2): STG=TG due to LOS  Skilled Therapeutic Interventions/Progress Updates:    Pt seen for OT session focusing on hands on family education with pt's two daughters and wife. Pt in supine upon arrival, agreeable to tx session, declined pain. Session focus on functional transfers bed <> padded tub bench with cut out and bed >w/c. He transferred to EOB with min A using hospital bed functions. Completed all transfers with overall min A once positioned on board with pt's daughters providing assist. Pt able to direct caregiver for board and w/c placement.  VCs throughout for head/hip relationship and hand placement during transfers. Seated on padded tub bench, pt completed lateral leans with guarding assist for clothing management, min A to pull pants entirely up in back.  Pt left seated in w/c at end of session, completing grooming tasks at sink with family assisting with set-up using AE. Discussed at length DME, continuum of care, pt's level of assist, energy conservation, and d/c planning.   Therapy Documentation Precautions:  Precautions Precautions: Fall, Other (comment) Precaution Comments: Left UE transmetacarpal amputation, R UE MCP amputation, bilateral BKA, G tube Restrictions Weight Bearing Restrictions: Yes RLE Weight Bearing: Non weight bearing LLE Weight Bearing: Non weight bearing Pain:   No/ denies pain ADL: ADL ADL Comments: Please see functional navigator for ADL status  See Function Navigator for Current Functional Status.   Therapy/Group: Individual Therapy  Lewis, Lamichael Youkhana C 02/16/2017, 7:17 AM

## 2017-02-16 NOTE — Telephone Encounter (Signed)
[facility] is calling stating pt is in rehab and if his sutures can come out and of Dr. Sharol Given can see py before he is discharged on Friday Pam love [phone]

## 2017-02-16 NOTE — Progress Notes (Signed)
Physical Therapy Session Note  Patient Details  Name: Scott [name] MRN: [id number] Date of Birth: 03/02/1946  Today's Date: 02/16/2017 PT Individual Time: 1310-1345 PT Individual Time Calculation (min): 35 min   Short Term Goals: Week 2:  PT Short Term Goal 1 (Week 2): =LTG due to estimated LOS  Skilled Therapeutic Interventions/Progress Updates:    Session 1: Working with pt and family on slideboard transfers from bed to w/c. Spouse willing to attempt transfer from bed to w/c. Encouraging pt to direct care/setup. PT providing cues for board placement, position of spouse, and hand placement. Transfer completed with close guard provided by PT. Discussed need to continue to work on skill during next session. Pt able to propel self in w/c around room with gloves on.  Session 2: Pt in bed upon arrival, just getting on bedpan with nursing. Once completed, bed>w/c transfer performed with spouse, continued close guard by PT. Pt propelling w/c in hall, assisted to gym for time. In gym, spouse performing w/c<>mat transfer (level) with supervision by PT. Pt and spouse report feeling more confident with transfer. Discussed with pt and family w/c placement and brakes before transfer. Encouraging continued family education on transfers. Pt in bed with all needs in reach following session.   Therapy Documentation Precautions:  Precautions Precautions: Fall, Other (comment) Precaution Comments: Left UE transmetacarpal amputation, R UE MCP amputation, bilateral BKA, G tube Restrictions Weight Bearing Restrictions: Yes RLE Weight Bearing: Non weight bearing LLE Weight Bearing: Non weight bearing General: PT Amount of Missed Time (min): 10 Minutes PT Missed Treatment Reason: Nursing care (Pt on bedpan upon arrival.) Pain:  Reports his buttock is getting sore, returned to bed and repositioned.     See Function Navigator for Current Functional Status.   Therapy/Group: Individual Therapy  Linard Millers, PT 02/16/2017, 4:47 PM

## 2017-02-16 NOTE — Progress Notes (Signed)
[facility]IC[facility]ILIT[facility]     PROGRESS NOTE    Subjective/Complaints: Up with therapy. No new complaints. stil having leakage through trach stoma.   ROS: Scott [name] denies nausea, vomiting, diarrhea, cough, shortness of breath or chest pain      Objective: Vital Signs: Blood pressure 110/79, pulse 60, temperature 98 F (36.7 C), temperature source Oral, resp. rate 16, height 5' (1.524 m), weight 75 kg (165 lb 5.5 oz), SpO2 99 %. No results found.  Recent Labs  02/15/17 0717  WBC 7.1  HGB 10.6*  HCT 36.0*  PLT 249   No results for input(s): NA, K, CL, GLUCOSE, BUN, CREATININE, CALCIUM in the last 72 hours.  Invalid input(s): CO CBG (last 3)   Recent Labs  02/15/17 1616 02/15/17 2111 02/16/17 0624  GLUCAP 113* 124* 101*    Wt Readings from Last 3 Encounters:  02/16/17 75 kg (165 lb 5.5 oz)  01/31/17 101 kg (222 lb 10.6 oz)  02/04/17 101 kg (222 lb 10.6 oz)    Physical Exam:  Constitutional: Scott [name] is oriented to person, place, and time. Scott [name] appears well-developed and well-nourished.  No distress  HENT:  Head: Normocephalic and atraumatic.  Eyes: Conjunctivae are normal. Pupils are equal, round, and reactive to light.  Neck: Normal range of motion. Neck supple.  Stoma closing but continues to leak air without pressure upon stoma Cardiovascular: RRR   Respiratory: CTA B    GI: Soft. Bowel sounds are normal. Scott [name] exhibits no distension. There is no tenderness.  PEG site clean with central granulation Genitourinary:  Genitourinary Comments: scrotum packed/stable  Musculoskeletal:  B-BKA incisions-gradually improving. Pink granulation underneath eschar. Right thigh wound with granulation. Right MCP site with incision intact and sutures in place, dry, dressed Neurological: Scott [name] is alert and oriented to person, place, and time. Coordination abnormal.  Speech clear. Reasonable insight and awareness. UE limited by discomfort to an extent. B/L deltoid, biceps,  triceps are 4/5. Wrist and fingers limited by splints and amputations. LE: 4/5 HF, 4/5 KE.   Skin: Scrotal wound with drainage from drain site buttocks reddened, scarred.    Psychiatric: pleasant and appropriate    Assessment/Plan: 1. Debility and mobility/functional deficits secondary to multiple medical issues, 4 limb amputations which require 3+ hours per day of interdisciplinary therapy in a comprehensive inpatient rehab setting. Physiatrist is providing close team supervision and 24 hour management of active medical problems listed below. Physiatrist and rehab team continue to assess barriers to discharge/monitor patient progress toward functional and medical goals.  Function:  Bathing Bathing position   Position: Shower  Bathing parts Body parts bathed by patient: Right arm, Right upper leg, Left arm, Left upper leg, Chest, Abdomen, Front perineal area Body parts bathed by helper: Buttocks, Back  Bathing assist Assist Level: Touching or steadying assistance(Scott [name] > 75%)      Upper Body Dressing/Undressing Upper body dressing   What is the patient wearing?: Pull over shirt/dress     Pull over shirt/dress - Perfomed by patient: Thread/unthread left sleeve, Put head through opening, Thread/unthread right sleeve, Pull shirt over trunk Pull over shirt/dress - Perfomed by helper: Put head through opening, Pull shirt over trunk        Upper body assist Assist Level: Supervision or verbal cues, Set up   Set up : To obtain clothing/put away  Lower Body Dressing/Undressing Lower body dressing   What is the patient wearing?: Pants   Underwear - Performed by helper: Thread/unthread right underwear leg,  Thread/unthread left underwear leg, Pull underwear up/down Pants- Performed by patient: Thread/unthread right pants leg, Thread/unthread left pants leg, Pull pants up/down Pants- Performed by helper: Pull pants up/down                      Lower body assist Assist for lower  body dressing: Touching or steadying assistance (Scott [name] > 75%)      Toileting Toileting Toileting activity did not occur: No continent bowel/bladder event (simulated for family education) Toileting steps completed by patient: Adjust clothing prior to toileting Toileting steps completed by helper: Performs perineal hygiene    Toileting assist Assist level:  (MAX A)   Transfers Chair/bed transfer   Chair/bed transfer method: Lateral scoot Chair/bed transfer assist level: Touching or steadying assistance (Scott [name] > 75%) Chair/bed transfer assistive device: Sliding board, Armrests     Locomotion Ambulation           Wheelchair   Type: Manual Max wheelchair distance: 150 ft Assist Level: Supervision or verbal cues  Cognition Comprehension Comprehension assist level: Follows complex conversation/direction with extra time/assistive device  Expression Expression assist level: Expresses complex 90% of the time/cues < 10% of the time  Social Interaction Social Interaction assist level: Interacts appropriately with others - No medications needed.  Problem Solving Problem solving assist level: Solves complex problems: With extra time  Memory Memory assist level: Recognizes or recalls 75 - 89% of the time/requires cueing 10 - 24% of the time   Medical Problem List and Plan: 1.  Deconditioning and functional/mobility deficits secondary to complications after CABG/AVR with subsequent respiratory failure as well as ischemic injury to all 4 limbs which required bilateral BKA's and bilateral TM-C amputations.              -continue therapies  -will use a manual w/c for now and make decisions about prosthetics as outpt 2.  RV thrombus/Anticoagulation: Pharmaceutical: Coumadin   3. Pain Management: Prn medications effective.  4. Mood: Motivated to get stronger. LCSW to follow for evaluation and support.  5. Neuropsych: This patient is capable of making decisions on his own behalf. 6. Skin/Wound Care:  Monitor wounds for healing.     -continue pink tape/occlusive dressing. -will pack iodoform gauze into stoma to assist with healing 7. Fluids/Electrolytes/Nutrition: good po intake  -PEG out and site healing with granulation 8. CAD s/p AVR: continue IV heparin till INR therapeutic.On ASA, Atenolol and Lipitor.  9. A fib: Monitor HR bid. Continue digoxin, atenolol and amiodarone.  10. B-BKA, Left transmetatarsal and right MCP amputations due to DIC:               -continue santyl dressing b/l BKA's. Left knee wound. Reviewed wound care with family who was present in wound today.   -hand wounds healing without issues.  11. 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 Diarrhea: generally mproving on lomotil prn 13. Anemia of chronic illness: Slowly improving --10.6 14. T2DM: Monitor BS ac/hs. Continue lantus insulin. Sugars under reasonable control  -SSI 15. HTN: Monitor BP bid--on lisinopril, lasix, digoxin and atenolol  16. Uro: wound care as above  -bladder emptying   Greater than 50% of time during this encounter was spent counseling patient/family in regard to reviewing and demonstrating wound care    LOS (Days) [place] T, MD 02/16/2017 11:24 AM

## 2017-02-16 NOTE — Progress Notes (Addendum)
ANTICOAGULATION CONSULT NOTE - Follow Up Consult  Pharmacy Consult for coumadin Indication: afib and RV thrombus  Allergies  Allergen Reactions  . Doxycycline Hives  . Penicillins Hives     Has patient had a PCN reaction causing immediate rash, facial/tongue/throat swelling, SOB or lightheadedness with hypotension: No Has patient had a PCN reaction causing severe rash involving mucus membranes or skin necrosis: No Has patient had a PCN reaction that required hospitalization: No Has patient had a PCN reaction occurring within the last 10 years:# # # YES # # #  If all of the above answers are "NO", then may proceed with Cephalosporin use.   . Sulfa Antibiotics Hives    Patient Measurements: Height: 5' (152.4 cm) (estimate) Weight: 165 lb 5.5 oz (75 kg) IBW/kg (Calculated) : 50 Heparin Dosing Weight:   Vital Signs: Temp: 98 F (36.7 C) (03/27 0528) Temp Source: Oral (03/27 0528) BP: 110/79 (03/27 0528) Pulse Rate: 60 (03/27 0528)  Labs:  Recent Labs  02/14/17 0540 02/15/17 0717  HGB  --  10.6*  HCT  --  36.0*  PLT  --  249  LABPROT 29.2* 25.2*  INR 2.70 2.25    Estimated Creatinine Clearance: 49.9 mL/min (by C-G formula based on SCr of 1.17 mg/dL).   Medications:  Scheduled:  . amiodarone  100 mg Oral Daily  . aspirin EC  81 mg Oral Daily  . atenolol  25 mg Oral Daily  . atorvastatin  40 mg Oral q1800  . collagenase   Topical Daily  . digoxin  0.125 mg Oral Daily  . famotidine  20 mg Oral BID  . ferrous sulfate  325 mg Oral BID WC  . furosemide  40 mg Oral BID  . gabapentin  300 mg Oral BID  . hydrocerin   Topical BID  . insulin aspart  0-5 Units Subcutaneous QHS  . insulin aspart  0-9 Units Subcutaneous TID WC  . insulin glargine  5 Units Subcutaneous BID  . lisinopril  2.5 mg Oral Daily  . Melatonin  3 mg Oral QHS  . potassium chloride  20 mEq Oral Daily  . rOPINIRole  2 mg Oral QHS  . sertraline  75 mg Oral Daily  . vitamin C  500 mg Oral Daily  .  Warfarin - Pharmacist Dosing Inpatient   Does not apply q1800  . zinc sulfate  220 mg Oral BID  . zolpidem  5 mg Oral QHS   Infusions:    Assessment: 71 yo male with afib and RV thrombus is currently on therapeutic coumadin.  INR today is 2.28.  Goal of Therapy:  INR 2-3 Monitor platelets by anticoagulation protocol: Yes   Plan:  - coumadin 2.5 mg po x1 - INR in am  Woody Kronberg, Tsz-Yin 02/16/2017,8:21 AM

## 2017-02-16 NOTE — Progress Notes (Signed)
Recreational Therapy Discharge Summary Patient Details  Name: Scott [name] MRN: [id number] Date of Birth: [birth year] Today's Date: 02/16/2017  Comments on progress toward goals: Pt is making great progress toward goals and is scheduled for discharge home with family to provide 24 hour supervision/assistance on 02/19/17.  Education provided on energy conservation techniques, leisure activity analysis with potential modifications and available adaptive equipment, community pursuits &family education. Pt is anxious to return home and return to previously enjoyed activities as able. Reasons for discharge: discharge from hospital Patient/family agrees with progress made and goals achieved: Yes  [name] 02/16/2017, 2:14 PM

## 2017-02-17 ENCOUNTER — Inpatient Hospital Stay (HOSPITAL_COMMUNITY): Payer: Medicare Other | Admitting: Speech Pathology

## 2017-02-17 ENCOUNTER — Inpatient Hospital Stay (HOSPITAL_COMMUNITY): Payer: Medicare Other | Admitting: Physical Therapy

## 2017-02-17 ENCOUNTER — Inpatient Hospital Stay (HOSPITAL_COMMUNITY): Payer: Medicare Other | Admitting: Occupational Therapy

## 2017-02-17 LAB — GLUCOSE, CAPILLARY
GLUCOSE-CAPILLARY: 86 mg/dL (ref 65–99)
GLUCOSE-CAPILLARY: 117 mg/dL — AB (ref 65–99)
Glucose-Capillary: 121 mg/dL — ABNORMAL HIGH (ref 65–99)

## 2017-02-17 LAB — PROTIME-INR
INR: 2.69
Prothrombin Time: 29.1 seconds — ABNORMAL HIGH (ref 11.4–15.2)

## 2017-02-17 MED ORDER — WARFARIN SODIUM 2 MG PO TABS
2.0000 mg | ORAL_TABLET | Freq: Once | ORAL | Status: AC
  Administered 2017-02-17: 2 mg via ORAL
  Filled 2017-02-17: qty 1

## 2017-02-17 MED ORDER — INSULIN STARTER KIT- PEN NEEDLES (ENGLISH)
1.0000 | Freq: Once | Status: DC
  Filled 2017-02-17: qty 1

## 2017-02-17 NOTE — Progress Notes (Signed)
Patient ID: Scott [name], male   DOB: Apr 01, 1946, 71 y.o.   MRN: [id number] Patient is seen in follow-up status post 4 extremity amputations. Examination the left hand is healed well the right hand surgical incision is healing nicely I will place orders to have the sutures removed. Bilateral transtibial amputations are healing slowly there is still some fibrinous exudative tissue on the left transtibial amputation. There is no cellulitis no odor no signs of infection. There are mild ischemic changes both lower extremities.  Patient will continue the stump shrinker as for both lower extremities. I will follow-up as an outpatient.

## 2017-02-17 NOTE — Progress Notes (Signed)
Social Work Patient ID: Scott [name], male   DOB: December 13, 1945, 71 y.o.   MRN: [id number]   Have reviewed team conf with pt and family.  All feeling a little more ready for d/c on Friday and with ongoing education.  Good car transfer trials today with no issues.  DME to be delivered this evening (bed).  Preparing for d/c.  Texanna Hilburn, LCSW

## 2017-02-17 NOTE — Progress Notes (Signed)
Physical Therapy Session Note  Patient Details  Name: Scott [name] MRN: [id number] Date of Birth: Feb 26, 1946  Today's Date: 02/17/2017 PT Individual Time: 1000-1100 PT Individual Time Calculation (min): 60 min   Short Term Goals: Week 2:  PT Short Term Goal 1 (Week 2): =LTG due to estimated LOS  Skilled Therapeutic Interventions/Progress Updates: Pt seen for family education session with focus on car transfer. Received seated in bed with family present; denies pain and agreeable to treatment. Pt's daughter assisted with donning sweatshirt for time management. Transfer bed >w/c with daughter providing minA; min cues from therapist regarding board placement and pt repositioning prior to placing board under hip. Transported to/from car [place] for time/energy conservation. Car transfer performed x1 trial in/out with wife providing minA. Performed second trial with one daughter performing transfer in, other daughter performing transfer out. MinA overall for all transfer, with appropriate w/c setup and board placement. Practiced placing w/c in/out of car and chair breakdown; pt's wife unable to lift w/c into car with wheels on and unable to remove wheels; will communicate with CSW and DME company to determine if this can be adjusted for caregiver safety. Family expressed interested in learning how to bump up/down curb step; will review tomorrow. Remained in w/c at end of session, all needs in reach.      Therapy Documentation Precautions:  Precautions Precautions: Fall, Other (comment) Precaution Comments: Left UE transmetacarpal amputation, R UE MCP amputation, bilateral BKA, G tube Restrictions Weight Bearing Restrictions: Yes RLE Weight Bearing: Non weight bearing LLE Weight Bearing: Non weight bearing Pain: Pain Assessment Pain Assessment: No/denies pain   See Function Navigator for Current Functional Status.   Therapy/Group: Individual Therapy  Luberta Mutter 02/17/2017,  12:06 PM

## 2017-02-17 NOTE — Progress Notes (Signed)
Physical Therapy Session Note  Patient Details  Name: Scott [name] MRN: [id number] Date of Birth: 1946-10-17  Today's Date: 02/17/2017 PT Individual Time: 0829-0905 PT Individual Time Calculation (min): 36 min    Skilled Therapeutic Interventions/Progress Updates:    Treatment focused on improving safety and pt direction with transfers for expected soon D/C home.  Pt transferred bed to wheelchair and return following session with min assist from therapist while utilizing sliding board.  Additionally, pt propelled manual wheelchair to work on endurance and functional mobility for home and community with min assist occasionally during propulsion over transitional surfaces such as from tile to carpet.  Following session, pt left up in bed with alarm set and call bell in reach.  Nursing notified of pt desire to have trach bandage changed.  Pt denied pain today.  Therapy Documentation Precautions:  Precautions Precautions: Fall, Other (comment) Precaution Comments: Left UE transmetacarpal amputation, R UE MCP amputation, bilateral BKA, G tube Restrictions Weight Bearing Restrictions: Yes RLE Weight Bearing: Non weight bearing LLE Weight Bearing: Non weight bearing   See Function Navigator for Current Functional Status.   Therapy/Group: Individual Therapy  Acsa Estey [name] 02/17/2017, 10:21 AM

## 2017-02-17 NOTE — Progress Notes (Signed)
ANTICOAGULATION CONSULT NOTE - Follow Up Consult  Pharmacy Consult for coumadin Indication: afib and RV thrombus  Allergies  Allergen Reactions  . Doxycycline Hives  . Penicillins Hives     Has patient had a PCN reaction causing immediate rash, facial/tongue/throat swelling, SOB or lightheadedness with hypotension: No Has patient had a PCN reaction causing severe rash involving mucus membranes or skin necrosis: No Has patient had a PCN reaction that required hospitalization: No Has patient had a PCN reaction occurring within the last 10 years:# # # YES # # #  If all of the above answers are "NO", then may proceed with Cephalosporin use.   . Sulfa Antibiotics Hives    Patient Measurements: Height: 5' (152.4 cm) (estimate) Weight: 166 lb 0.1 oz (75.3 kg) IBW/kg (Calculated) : 50 Heparin Dosing Weight:   Vital Signs: Temp: 98 F (36.7 C) (03/28 0524) Temp Source: Oral (03/28 0524) BP: 101/69 (03/28 0524) Pulse Rate: 59 (03/28 0524)  Labs:  Recent Labs  02/15/17 0717 02/16/17 0946 02/17/17 0549  HGB 10.6*  --   --   HCT 36.0*  --   --   PLT 249  --   --   LABPROT 25.2* 25.5* 29.1*  INR 2.25 2.28 2.69    Estimated Creatinine Clearance: 49.9 mL/min (by C-G formula based on SCr of 1.17 mg/dL).   Medications:  Scheduled:  . amiodarone  100 mg Oral Daily  . aspirin EC  81 mg Oral Daily  . atenolol  25 mg Oral Daily  . atorvastatin  40 mg Oral q1800  . collagenase   Topical Daily  . digoxin  0.125 mg Oral Daily  . famotidine  20 mg Oral BID  . ferrous sulfate  325 mg Oral BID WC  . fluticasone  1 spray Each Nare Daily  . furosemide  40 mg Oral BID  . gabapentin  300 mg Oral BID  . hydrocerin   Topical BID  . insulin aspart  0-5 Units Subcutaneous QHS  . insulin aspart  0-9 Units Subcutaneous TID WC  . insulin glargine  5 Units Subcutaneous BID  . lisinopril  2.5 mg Oral Daily  . loratadine  10 mg Oral Daily  . Melatonin  3 mg Oral QHS  . potassium chloride   20 mEq Oral Daily  . pseudoephedrine  120 mg Oral BID  . rOPINIRole  2 mg Oral QHS  . sertraline  75 mg Oral Daily  . vitamin C  500 mg Oral Daily  . Warfarin - Pharmacist Dosing Inpatient   Does not apply q1800  . zinc sulfate  220 mg Oral BID  . zolpidem  5 mg Oral QHS   Infusions:    Assessment: 71 yo male with afib and RV thrombus is currently on therapeutic coumadin.  INR today is 2.69.   Goal of Therapy:  INR 2-3 Monitor platelets by anticoagulation protocol: Yes   Plan:  - coumadin 2 mg po x1 - INR in am  Maryanna Shape, PharmD, BCPS  Clinical Pharmacist  Pager: [phone]  02/17/2017,11:20 AM

## 2017-02-17 NOTE — Progress Notes (Signed)
Occupational Therapy Session Note  Patient Details  Name: ADOLFO GRANIERI MRN: [id number] Date of Birth: April 27, 1946  Today's Date: 02/17/2017 OT Individual Time: 1105-1200 OT Individual Time Calculation (min): 55 min    Short Term Goals: Week 2:  OT Short Term Goal 1 (Week 2): STG=TG due to LOS  Skilled Therapeutic Interventions/Progress Updates:    Pt seen for OT session focusing on functional transfers with caregivers and ADL re-training. Pt sitting up in w/c upon arrival with daughter present. Pt's wife present and assisted with all transfers during session. Pt voiced need for toileting needs. Completed min A transfer w/c <> padded tub bench with cut out. Lateral leans for clothing management, pt able to pull pants down, requiring assist to pull pants up and to complete hygiene.  Discussed at length toileting needs at d/c. Pt's wife with concerns regarding toileting in public and number of transfers throughout the day due to pt's fatigue. Strongly recommended pt practicing using urinal in w/c to eliminate  Need for extra transfers. Practiced when pt returned to w/c and with total A for clothing management, pt able to hold urinal in place. Recommend pt use this method at next void. Pt completed grooming task seated in w/c at sink using AE. Pt able to direct caregiver appropriately for assist with set-up. Discussed throughout session regarding reducing caregiver burden, importance of participation/ independence with ADLS, and d/c planning.    Therapy Documentation Precautions:  Precautions Precautions: Fall, Other (comment) Precaution Comments: Left UE transmetacarpal amputation, R UE MCP amputation, bilateral BKA, G tube Restrictions Weight Bearing Restrictions: Yes RLE Weight Bearing: Non weight bearing LLE Weight Bearing: Non weight bearing Pain:   No/ denies pain ADL: ADL ADL Comments: Please see functional navigator for ADL status  See Function Navigator for Current  Functional Status.   Therapy/Group: Individual Therapy  Lewis, Amauris Debois C 02/17/2017, 7:14 AM

## 2017-02-17 NOTE — Patient Care Conference (Signed)
Inpatient RehabilitationTeam Conference and Plan of Care Update Date: 02/16/2017   Time: 2:05 PM    Patient Name: Scott [name]      Medical Record Number: [id number]  Date of Birth: [birth year] Sex: Male         Room/Bed: 4W03C/4W03C-01 Payor Info: Payor: MEDICARE / Plan: MEDICARE PART A AND B / Product Type: *No Product type* /    Admitting Diagnosis: Debility  Admit Date/Time:  02/05/2017  4:03 PM Admission Comments: No comment available   Primary Diagnosis:  Status post bilateral below knee amputation ([place]) Principal Problem: Status post bilateral below knee amputation [facility])  Patient Active Problem List   Diagnosis Date Noted  . Status post bilateral below knee amputation ([place]) 02/09/2017  . Wound disruption, post-op, skin, sequela 02/09/2017  . Debility 02/05/2017  . Ganglion upper arm, right   . Thrombosis of both upper extremities   . Suspected heparin induced thrombocytopenia (HIT) in hospitalized patient ([place])   . Gangrene of lower extremity ([place])   . Heparin induced thrombocytopenia (HIT) ([place])   . Tracheostomy status ([place])   . Chest tube in place   . Atherosclerosis of native arteries of extremities with gangrene, left leg ([place])   . Atherosclerosis of native arteries of extremities with gangrene, right leg ([place])   . Acute on chronic diastolic CHF (congestive heart failure) ([place])   . Enteritis due to Clostridium difficile   . Anasarca   . FUO (fever of unknown origin)   . Acute encephalopathy   . Elevated LFTs   . DIC (disseminated intravascular coagulation) ([place]) 10/28/2016  . Postoperative atrial fibrillation ([place]) 10/24/2016  . Cardiogenic shock ([place])   . Mitral regurgitation due to cusp prolapse 10/22/2016  . S/P aortic valve replacement with bioprosthetic valve 10/22/2016  . S/P ascending aortic aneurysm repair 10/22/2016  . S/P mitral valve repair 10/22/2016  . Hx of CABG 10/22/2016  . Thrombocytopenia ([place]) 10/22/2016  . Acute combined systolic and  diastolic congestive heart failure ([employer]) 10/22/2016  . Acute respiratory failure ([place]) 10/22/2016  . Thoracic ascending aortic aneurysm ([place]) 10/21/2016  . Coronary artery disease involving native coronary artery of native heart with unstable angina pectoris ([place]) 10/20/2016  . Severe aortic stenosis by prior echocardiography 10/19/2016  . Unstable angina ([place]) 10/19/2016  . Aortic valve stenosis 10/19/2016  . Bicuspid aortic valve 10/19/2016  . Trigger ring finger of right hand   . Wears glasses   . Gout   . Hypertension   . Carpal tunnel syndrome of right wrist   . Arthritis   . Snores     Expected Discharge Date: Expected Discharge Date: 02/19/17  Team Members Present: Physician leading conference: Dr. Alger Simons Social Worker Present: Lennart Pall, LCSW Nurse Present: Heather Roberts, RN PT Present: Canary Brim, PT OT Present: Napoleon Form, OT;Roanna Epley, COTA SLP Present: Stormy Fabian, SLP PPS Coordinator present : Daiva Nakayama, RN, CRRN     Current Status/Progress Goal Weekly Team Focus  Medical   trach stoma still open. wounds closing. eating well. peg out  improve wound status  ongoing wound care, family education   Bowel/Bladder   continent of B&B  Remain comtinent   Assess for any changes    Swallow/Nutrition/ Hydration   Regular textures with thin liquids-Mod I   Mod I  Goals Met   ADL's   Min-mod A functional transfers; mod- max A toileting; Min A bathing/dressing  Mod A toilet  transfers, mod A toileting, min A bathing UB dressing  Family education, d/c planning, functional transfes   Mobility   minA bed mobility, min/mod transfers, S w/c propulsion  S bed mobility, modA transfers w/c <>bed, minA w/c propulsion  pt/family education and hands on training to prepare for d/c home   Communication   Supervision at conversation level  Mod I  pressure to stoma to maximize vocal intensity    Safety/Cognition/ Behavioral Observations  Supervision-Mod I for recall of new  information   Mod I  Use of compensatory strategies, family education    Pain   no complaints of pain   <4 out of 10.   Assess pain Q shift and prn and treat accordingly    Skin   Bilateral amputee. Multiple wounds healing on lower extremities. Santyl and wet to dry dx. Trach site iodoform and occlusive dressing. Scrotal wound with daily packing with wet to dry dressing   Monitor for s/s of infection to wounds  no new skin breakdown     Rehab Goals Patient on target to meet rehab goals: Yes *See Care Plan and progress notes for long and short-term goals.  Barriers to Discharge: numerous wounds to manage    Possible Resolutions to Barriers:  family education, resolution of some of wounds prior to discharge    Discharge Planning/Teaching Needs:  Home with wife and daughters who live locally and all can provide 24/7 assistance.  Teaching ongoing this week with wife and two daughters.   Team Discussion:  Will need f/u on wounds; trying to get stoma closed up and adding iodaform.  Min assist overall with tfs (if relatively level). Family ed ongoing.  Min- mod with PT.  Intermittent cues for cognition.  Doing much better directing his own care.  On track for d/c end of week.  Revisions to Treatment Plan:  None   Continued Need for Acute Rehabilitation Level of Care: The patient requires daily medical management by a physician with specialized training in physical medicine and rehabilitation for the following conditions: Daily direction of a multidisciplinary physical rehabilitation program to ensure safe treatment while eliciting the highest outcome that is of practical value to the patient.: Yes Daily medical management of patient stability for increased activity during participation in an intensive rehabilitation regime.: Yes Daily analysis of laboratory values and/or radiology reports with any subsequent need for medication adjustment of medical intervention for : Pulmonary problems;Post  surgical problems;Wound care problems;Urological problems  Antoinette Borgwardt 02/17/2017, 1:57 PM

## 2017-02-17 NOTE — Plan of Care (Signed)
Problem: RH Bed to Chair Transfers Goal: LTG Patient will perform bed/chair transfers w/assist (PT) LTG: Patient will perform bed/chair transfers with assistance, with/without cues (PT).  Upgraded d/t progress  Problem: RH Wheelchair Mobility Goal: LTG Patient will propel w/c in controlled environment (PT) LTG: Patient will propel wheelchair in controlled environment, # of feet with assist (PT)  Upgraded d/t progress Goal: LTG Patient will propel w/c in home environment (PT) LTG: Patient will propel wheelchair in home environment, # of feet with assistance (PT).  Upgraded d/t progress

## 2017-02-17 NOTE — Progress Notes (Signed)
[facility] PHYSICAL MEDICINE & REHABILITATION     PROGRESS NOTE    Subjective/Complaints: No new complaints. Feels well. Pain controlled. Tells me that Dr. Sharol Given was in today!!!  ROS: pt denies nausea, vomiting, diarrhea, cough, shortness of breath or chest pain      Objective: Vital Signs: Blood pressure 101/69, pulse (!) 59, temperature 98 F (36.7 C), temperature source Oral, resp. rate 18, height 5' (1.524 m), weight 75.3 kg (166 lb 0.1 oz), SpO2 100 %. No results found.  Recent Labs  02/15/17 0717  WBC 7.1  HGB 10.6*  HCT 36.0*  PLT 249   No results for input(s): NA, K, CL, GLUCOSE, BUN, CREATININE, CALCIUM in the last 72 hours.  Invalid input(s): CO CBG (last 3)   Recent Labs  02/16/17 2111 02/17/17 0625 02/17/17 1144  GLUCAP 155* 86 121*    Wt Readings from Last 3 Encounters:  02/17/17 75.3 kg (166 lb 0.1 oz)  01/31/17 101 kg (222 lb 10.6 oz)  02/04/17 101 kg (222 lb 10.6 oz)    Physical Exam:  Constitutional: He is oriented to person, place, and time. He appears well-developed and well-nourished.  No distress  HENT:  Head: Normocephalic and atraumatic.  Eyes: Conjunctivae are normal. Pupils are equal, round, and reactive to light.  Neck: Normal range of motion. Neck supple.  Stoma packed Cardiovascular: RRR   Respiratory: CTA B    GI: Soft. Bowel sounds are normal. He exhibits no distension. There is no tenderness.  PEG   with central granulation Genitourinary:  Genitourinary Comments: scrotum packed/stable  Musculoskeletal:  B-BKA incisions-gradually improving. Pink granulation underneath eschar. Right thigh wound with granulation. Right MCP site with incision intact and sutures in place, dry, dressed Neurological: He is alert and oriented to person, place, and time. Coordination abnormal.  Speech clear. Reasonable insight and awareness. UE limited by discomfort to an extent. B/L deltoid, biceps, triceps are 4/5. Wrist and fingers limited by  splints and amputations. LE: 4/5 HF, 4/5 KE.   Skin: Scrotal wound with drainage from drain site buttocks reddened, scarred.    Psychiatric: pleasant and appropriate    Assessment/Plan: 1. Debility and mobility/functional deficits secondary to multiple medical issues, 4 limb amputations which require 3+ hours per day of interdisciplinary therapy in a comprehensive inpatient rehab setting. Physiatrist is providing close team supervision and 24 hour management of active medical problems listed below. Physiatrist and rehab team continue to assess barriers to discharge/monitor patient progress toward functional and medical goals.  Function:  Bathing Bathing position   Position: Shower  Bathing parts Body parts bathed by patient: Right arm, Right upper leg, Left arm, Left upper leg, Chest, Abdomen, Front perineal area Body parts bathed by helper: Buttocks, Back  Bathing assist Assist Level: Touching or steadying assistance(Pt > 75%)      Upper Body Dressing/Undressing Upper body dressing   What is the patient wearing?: Pull over shirt/dress     Pull over shirt/dress - Perfomed by patient: Thread/unthread left sleeve, Put head through opening, Thread/unthread right sleeve, Pull shirt over trunk Pull over shirt/dress - Perfomed by helper: Put head through opening, Pull shirt over trunk        Upper body assist Assist Level: Supervision or verbal cues, Set up   Set up : To obtain clothing/put away  Lower Body Dressing/Undressing Lower body dressing   What is the patient wearing?: Pants   Underwear - Performed by helper: Thread/unthread right underwear leg, Thread/unthread left underwear leg, Pull underwear  up/down Pants- Performed by patient: Thread/unthread right pants leg, Thread/unthread left pants leg, Pull pants up/down Pants- Performed by helper: Pull pants up/down                      Lower body assist Assist for lower body dressing: Touching or steadying  assistance (Pt > 75%)      Toileting Toileting Toileting activity did not occur: No continent bowel/bladder event (simulated for family education) Toileting steps completed by patient: Adjust clothing prior to toileting Toileting steps completed by helper: Performs perineal hygiene, Adjust clothing after toileting    Toileting assist Assist level: Touching or steadying assistance (Pt.75%)   Transfers Chair/bed transfer   Chair/bed transfer method: Lateral scoot Chair/bed transfer assist level: Touching or steadying assistance (Pt > 75%) Chair/bed transfer assistive device: Sliding board, Armrests     Locomotion Ambulation           Wheelchair   Type: Manual Max wheelchair distance:  (150 ft) Assist Level: Touching or steadying assistance (Pt > 75%)  Cognition Comprehension Comprehension assist level: Follows complex conversation/direction with extra time/assistive device  Expression Expression assist level: Expresses complex 90% of the time/cues < 10% of the time  Social Interaction Social Interaction assist level: Interacts appropriately with others - No medications needed.  Problem Solving Problem solving assist level: Solves complex problems: With extra time  Memory Memory assist level: Recognizes or recalls 90% of the time/requires cueing < 10% of the time   Medical Problem List and Plan: 1.  Deconditioning and functional/mobility deficits secondary to complications after CABG/AVR with subsequent respiratory failure as well as ischemic injury to all 4 limbs which required bilateral BKA's and bilateral TM-C amputations.              -continue therapies  - manual w/c for now and make decisions about prosthetics as outpt 2.  RV thrombus/Anticoagulation: Pharmaceutical: Coumadin   3. Pain Management: Prn medications effective.  4. Mood: Motivated to get stronger. LCSW to follow for evaluation and support.  5. Neuropsych: This patient is capable of making decisions on his own  behalf. 6. Skin/Wound Care: Monitor wounds for healing.     -continue pink tape/occlusive dressing. -will pack iodoform gauze into stoma to assist with healing 7. Fluids/Electrolytes/Nutrition: good po intake  -PEG out and site healing with granulation 8. CAD s/p AVR: continue IV heparin till INR therapeutic.On ASA, Atenolol and Lipitor.  9. A fib: Monitor HR bid. Continue digoxin, atenolol and amiodarone.  10. B-BKA, Left transmetatarsal and right MCP amputations due to DIC:               -continue santyl dressing b/l BKA's. Left knee wound. Reviewed wound care with family who was present in wound today. I may debride some tissue on BKA's tomorrow  -hand wounds healing without issues--sutures out per ortho today.  11. 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 Diarrhea: generally mproving on lomotil prn 13. Anemia of chronic illness: Slowly improving --10.6 14. T2DM: Monitor BS ac/hs. Continue lantus insulin. Sugars under reasonable control  -SSI 15. HTN: Monitor BP bid--on lisinopril, lasix, digoxin and atenolol  16. Uro: wound care as above  -bladder emptying        LOS (Days) 12 A FACE TO FACE EVALUATION WAS PERFORMED  Meredith Staggers, MD 02/17/2017 12:40 PM

## 2017-02-17 NOTE — Progress Notes (Signed)
Speech Language Pathology Daily Session Note  Patient Details  Name: Scott [name] MRN: [id number] Date of Birth: 27-Feb-1946  Today's Date: 02/17/2017 SLP Individual Time: 0730-0830 SLP Individual Time Calculation (min): 60 min  Short Term Goals: Week 2: SLP Short Term Goal 1 (Week 2): Patient will apply pressure to stoma during verbal expression to maximize vocal quality and overall speech intelligibility at the conversation level to 100% with Mod I.  SLP Short Term Goal 2 (Week 2): Patient will recall new, daily information with utilization of memory compensatory strategies and external aids with Mod I.   Skilled Therapeutic Interventions: Skilled treatment session focused on cognitive goals. Patient was Mod I to apply pressure to stoma to maximize vocal intensity to achieve 100% intelligibility at the conversation level. Patient was also Mod I for directing care in regards to tray set-up and donning universal cuff and for recall of functional information from previous therapy sessions. Patient was re-administered the [facility] and scored 18/22 points with a score of 18 or above considered normal. Patient demonstrated improvement from initial evaluation on 02/06/17 in which he scored 15/22 points. Patient continues to demonstrate deficits the areas of short-term recall and patient educated in regards to strategies to utilize at home to maximize carryover. He verbalized understanding and a handout was given to reinforce information. Patient left upright sitting EOB with all needs within reach. Continue with current plan of care.      Function:  Eating Eating   Modified Consistency Diet: No Eating Assist Level: Assistive Device;More than reasonable amount of time;Set up assist for Eating Assistive Device Comment: Universal cuff Eating Set Up Assist For: Opening containers;Cutting food       Cognition Comprehension Comprehension assist level: Follows complex conversation/direction with  extra time/assistive device  Expression   Expression assist level: Expresses complex ideas: With extra time/assistive device  Social Interaction Social Interaction assist level: Interacts appropriately with others - No medications needed.  Problem Solving Problem solving assist level: Solves complex problems: With extra time  Memory Memory assist level: More than reasonable amount of time    Pain No/Denies Pain  Therapy/Group: Individual Therapy  Scott [name] 02/17/2017, 5:33 PM

## 2017-02-17 NOTE — Progress Notes (Signed)
Educated wife on use of insulin pen. Wife verbalized understanding but will need teaching reinforced prior to discharge. Continue plan of care.

## 2017-02-18 ENCOUNTER — Inpatient Hospital Stay (HOSPITAL_COMMUNITY): Payer: Medicare Other | Admitting: Occupational Therapy

## 2017-02-18 ENCOUNTER — Inpatient Hospital Stay (HOSPITAL_COMMUNITY): Payer: Medicare Other | Admitting: Physical Therapy

## 2017-02-18 LAB — BASIC METABOLIC PANEL
Anion gap: 9 (ref 5–15)
BUN: 14 mg/dL (ref 6–20)
CALCIUM: 8.7 mg/dL — AB (ref 8.9–10.3)
CO2: 28 mmol/L (ref 22–32)
CREATININE: 0.83 mg/dL (ref 0.61–1.24)
Chloride: 103 mmol/L (ref 101–111)
GFR calc Af Amer: >60 mL/min (ref >60)
GFR calc non Af Amer: >60 mL/min (ref >60)
GLUCOSE: 104 mg/dL — AB (ref 65–99)
Potassium: 3.2 mmol/L — ABNORMAL LOW (ref 3.5–5.1)
Sodium: 140 mmol/L (ref 135–145)

## 2017-02-18 LAB — PROTIME-INR
INR: 3.09
Prothrombin Time: 32.6 seconds — ABNORMAL HIGH (ref 11.4–15.2)

## 2017-02-18 LAB — GLUCOSE, CAPILLARY
GLUCOSE-CAPILLARY: 128 mg/dL — AB (ref 65–99)
GLUCOSE-CAPILLARY: 106 mg/dL — AB (ref 65–99)
GLUCOSE-CAPILLARY: 141 mg/dL — AB (ref 65–99)
Glucose-Capillary: 95 mg/dL (ref 65–99)
Glucose-Capillary: 113 mg/dL — ABNORMAL HIGH (ref 65–99)

## 2017-02-18 MED ORDER — WARFARIN SODIUM 1 MG PO TABS
1.0000 mg | ORAL_TABLET | Freq: Once | ORAL | Status: DC
  Filled 2017-02-18: qty 1

## 2017-02-18 MED ORDER — POTASSIUM CHLORIDE CRYS ER 20 MEQ PO TBCR
20.0000 meq | EXTENDED_RELEASE_TABLET | Freq: Two times a day (BID) | ORAL | Status: DC
  Administered 2017-02-18 – 2017-02-19 (×2): 20 meq via ORAL
  Filled 2017-02-18 (×2): qty 1

## 2017-02-18 MED ORDER — WARFARIN SODIUM 1 MG PO TABS
1.0000 mg | ORAL_TABLET | Freq: Once | ORAL | Status: AC
  Administered 2017-02-18: 1 mg via ORAL
  Filled 2017-02-18 (×2): qty 1

## 2017-02-18 NOTE — Progress Notes (Signed)
ANTICOAGULATION CONSULT NOTE - Follow Up Consult  Pharmacy Consult for coumadin Indication: afib and RV thrombus  Allergies  Allergen Reactions  . Doxycycline Hives  . Penicillins Hives     Has patient had a PCN reaction causing immediate rash, facial/tongue/throat swelling, SOB or lightheadedness with hypotension: No Has patient had a PCN reaction causing severe rash involving mucus membranes or skin necrosis: No Has patient had a PCN reaction that required hospitalization: No Has patient had a PCN reaction occurring within the last 10 years:# # # YES # # #  If all of the above answers are "NO", then may proceed with Cephalosporin use.   . Sulfa Antibiotics Hives    Patient Measurements: Height: 5' (152.4 cm) (estimate) Weight: 166 lb 0.1 oz (75.3 kg) IBW/kg (Calculated) : 50 Heparin Dosing Weight:   Vital Signs: Temp: 98.6 F (37 C) (03/29 0359) Temp Source: Oral (03/29 0359) BP: 101/69 (03/29 0853) Pulse Rate: 62 (03/29 0853)  Labs:  Recent Labs  02/16/17 0946 02/17/17 0549 02/18/17 0528  LABPROT 25.5* 29.1* 32.6*  INR 2.28 2.69 3.09  CREATININE  --   --  0.83    Estimated Creatinine Clearance: 70.4 mL/min (by C-G formula based on SCr of 0.83 mg/dL).   Medications:  Scheduled:  . amiodarone  100 mg Oral Daily  . aspirin EC  81 mg Oral Daily  . atenolol  25 mg Oral Daily  . atorvastatin  40 mg Oral q1800  . collagenase   Topical Daily  . digoxin  0.125 mg Oral Daily  . famotidine  20 mg Oral BID  . ferrous sulfate  325 mg Oral BID WC  . fluticasone  1 spray Each Nare Daily  . furosemide  40 mg Oral BID  . gabapentin  300 mg Oral BID  . hydrocerin   Topical BID  . insulin aspart  0-5 Units Subcutaneous QHS  . insulin aspart  0-9 Units Subcutaneous TID WC  . insulin glargine  5 Units Subcutaneous BID  . insulin starter kit- pen needles  1 kit Other Once  . lisinopril  2.5 mg Oral Daily  . loratadine  10 mg Oral Daily  . Melatonin  3 mg Oral QHS  .  potassium chloride  20 mEq Oral BID  . rOPINIRole  2 mg Oral QHS  . sertraline  75 mg Oral Daily  . vitamin C  500 mg Oral Daily  . Warfarin - Pharmacist Dosing Inpatient   Does not apply q1800  . zinc sulfate  220 mg Oral BID  . zolpidem  5 mg Oral QHS   Infusions:    Assessment: 71 yo male with afib and RV thrombus is currently on therapeutic coumadin.  INR today is 3.09.   Goal of Therapy:  INR 2-3 Monitor platelets by anticoagulation protocol: Yes   Plan:  - coumadin 1 mg po x1 - INR in am  Maryanna Shape, PharmD, BCPS  Clinical Pharmacist  Pager: [phone]  02/18/2017,2:16 PM

## 2017-02-18 NOTE — Progress Notes (Signed)
[place] PHYSICAL MEDICINE & REHABILITATION     PROGRESS NOTE    Subjective/Complaints: Finishing breakfast. No new complaints. Trach still leaking  ROS: pt denies nausea, vomiting, diarrhea, cough, shortness of breath or chest pain       Objective: Vital Signs: Blood pressure 101/69, pulse 62, temperature 98.6 F (37 C), temperature source Oral, resp. rate 19, height 5' (1.524 m), weight 75.3 kg (166 lb 0.1 oz), SpO2 99 %. No results found. No results for input(s): WBC, HGB, HCT, PLT in the last 72 hours.  Recent Labs  02/18/17 0528  NA 140  K 3.2*  CL 103  GLUCOSE 104*  BUN 14  CREATININE 0.83  CALCIUM 8.7*   CBG (last 3)   Recent Labs  02/17/17 1702 02/17/17 2048 02/18/17 0620  GLUCAP 117* 128* 95    Wt Readings from Last 3 Encounters:  02/17/17 75.3 kg (166 lb 0.1 oz)  01/31/17 101 kg (222 lb 10.6 oz)  02/04/17 101 kg (222 lb 10.6 oz)    Physical Exam:  Constitutional: He is oriented to person, place, and time. He appears well-developed and well-nourished.  No distress  HENT:  Head: Normocephalic and atraumatic.  Eyes: Conjunctivae are normal. Pupils are equal, round, and reactive to light.  Neck: Normal range of motion. Neck supple.  Stoma dressed, not packed Cardiovascular: RRR   Respiratory: CTA B    GI: Soft. Bowel sounds are normal. He exhibits no distension. There is no tenderness.  PEG   with central granulation--closed Genitourinary:  Genitourinary Comments: scrotum packed/stable ---granulating in Musculoskeletal:  B-BKA incisions-gradually improving. Pink granulation underneath eschar which is loosening up. Right thigh wound with granulation. Right MCP sites healing with scab. Neurological: He is alert and oriented to person, place, and time. Coordination abnormal.  Speech clear. Reasonable insight and awareness. UE limited by discomfort to an extent. B/L deltoid, biceps, triceps are 4/5. Wrist and fingers limited by splints and  amputations. LE: 4/5 HF, 4/5 KE.   Skin: Scrotal wound with drainage from drain site buttocks reddened, scarred.    Psychiatric: pleasant and appropriate    Assessment/Plan: 1. Debility and mobility/functional deficits secondary to multiple medical issues, 4 limb amputations which require 3+ hours per day of interdisciplinary therapy in a comprehensive inpatient rehab setting. Physiatrist is providing close team supervision and 24 hour management of active medical problems listed below. Physiatrist and rehab team continue to assess barriers to discharge/monitor patient progress toward functional and medical goals.  Function:  Bathing Bathing position   Position: Shower  Bathing parts Body parts bathed by patient: Right arm, Right upper leg, Left arm, Left upper leg, Chest, Abdomen, Front perineal area Body parts bathed by helper: Buttocks, Back  Bathing assist Assist Level: Touching or steadying assistance(Pt > 75%)      Upper Body Dressing/Undressing Upper body dressing   What is the patient wearing?: Pull over shirt/dress     Pull over shirt/dress - Perfomed by patient: Thread/unthread left sleeve, Put head through opening, Thread/unthread right sleeve, Pull shirt over trunk Pull over shirt/dress - Perfomed by helper: Put head through opening, Pull shirt over trunk        Upper body assist Assist Level: Supervision or verbal cues, Set up   Set up : To obtain clothing/put away  Lower Body Dressing/Undressing Lower body dressing   What is the patient wearing?: Pants   Underwear - Performed by helper: Thread/unthread right underwear leg, Thread/unthread left underwear leg, Pull underwear up/down Pants- Performed by  patient: Thread/unthread right pants leg, Thread/unthread left pants leg, Pull pants up/down Pants- Performed by helper: Pull pants up/down                      Lower body assist Assist for lower body dressing: Touching or steadying assistance (Pt >  75%)      Toileting Toileting Toileting activity did not occur: No continent bowel/bladder event (simulated for family education) Toileting steps completed by patient: Adjust clothing prior to toileting Toileting steps completed by helper: Performs perineal hygiene, Adjust clothing after toileting    Toileting assist Assist level: Touching or steadying assistance (Pt.75%)   Transfers Chair/bed transfer   Chair/bed transfer method: Lateral scoot Chair/bed transfer assist level: Touching or steadying assistance (Pt > 75%) Chair/bed transfer assistive device: Sliding board, Armrests     Locomotion Ambulation           Wheelchair   Type: Manual Max wheelchair distance:  (150 ft) Assist Level: Touching or steadying assistance (Pt > 75%)  Cognition Comprehension Comprehension assist level: Follows complex conversation/direction with extra time/assistive device  Expression Expression assist level: Expresses complex ideas: With extra time/assistive device  Social Interaction Social Interaction assist level: Interacts appropriately with others - No medications needed.  Problem Solving Problem solving assist level: Solves complex problems: With extra time  Memory Memory assist level: More than reasonable amount of time   Medical Problem List and Plan: 1.  Deconditioning and functional/mobility deficits secondary to complications after CABG/AVR with subsequent respiratory failure as well as ischemic injury to all 4 limbs which required bilateral BKA's and bilateral TM-C amputations.              -continue therapies--home tomorrow  - manual w/c for now and make decisions about prosthetics as outpt 2.  RV thrombus/Anticoagulation: Pharmaceutical: Coumadin   3. Pain Management: Prn medications effective.  4. Mood: Motivated to get stronger. LCSW to follow for evaluation and support.  5. Neuropsych: This patient is capable of making decisions on his own behalf. 6. Skin/Wound Care: Monitor  wounds for healing.     -continue pink tape/occlusive dressing. -stoma needs to be packed with iodoform gauze! 7. Fluids/Electrolytes/Nutrition: good po intake  -PEG out and site healed 8. CAD s/p AVR: continue IV heparin till INR therapeutic.On ASA, Atenolol and Lipitor.  9. A fib: Monitor HR bid. Continue digoxin, atenolol and amiodarone.  10. B-BKA, Left transmetatarsal and right MCP amputations due to DIC:               -continue santyl dressing b/l BKA's. Left knee wound. Reviewed wound care with RN today.  -will hold on debridement---continue santyl to stumps at home  -hand wounds healing without issues- .  11. 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 Diarrhea: generally mproving on lomotil prn 13. Anemia of chronic illness: Slowly improving --10.6 14. T2DM: Monitor BS ac/hs. Continue lantus insulin. Sugars under reasonable control  -SSI 15. HTN: Monitor BP bid--on lisinopril, lasix, digoxin and atenolol  16. Uro: wound care as above  -bladder emptying     Greater than 50% of time during this encounter was spent counseling patient/family in regard to wound care/mgt/review with RN/Pt.     LOS (Days) 13 A [place]  Meredith Staggers, MD 02/18/2017 8:58 AM

## 2017-02-18 NOTE — Progress Notes (Signed)
Occupational Therapy Session Note  Patient Details  Name: Scott [name] MRN: [id number] Date of Birth: 1946/03/16  Today's Date: 02/18/2017 OT Individual Time: 0930-1100 OT Individual Time Calculation (min): 90 min    Short Term Goals: Week 2:  OT Short Term Goal 1 (Week 2): STG=TG due to LOS  Skilled Therapeutic Interventions/Progress Updates:    Pt seen for OT ADL bathing/dressing session. Pt in supine upon arrival, exciting for showering task today. Maximove used to transfer pt to roll in shower chair. He bathed seated using bathmit. Pt able to reach to complete buttock hygiene. Set-up assist to open containers and occasionally for control of hand held shower head.  He returned to supine in bed, able to dress from bed level with set-up assist.  HIs daughter returned to room following shower and assist with min A sliding board transfer to w/c with VCs from therapist for technique and board placement.  He completed grooming tasks at sink, able to direct daughter with set-up of AE. Discussed with daughter d/c planning, modified ADLs, importance of independence with ADLs, transfer techniques, continuum of care, incorporating exercises into everyday tasks and reducing caregiver burden. Pt excited to be [name] home tomorrow, states it's "finally starting to feel real". Pt left seated in w/c at end of session, all needs in reach.   Therapy Documentation Precautions:  Precautions Precautions: Fall, Other (comment) Precaution Comments: Left UE transmetacarpal amputation, R UE MCP amputation, bilateral BKA, G tube Restrictions Weight Bearing Restrictions: Yes RLE Weight Bearing: Non weight bearing LLE Weight Bearing: Non weight bearing Pain:   No/denies pain ADL: ADL ADL Comments: Please see functional navigator for ADL status  See Function Navigator for Current Functional Status.   Therapy/Group: Individual Therapy  Lewis, Nazaire Cordial C 02/18/2017, 7:08 AM

## 2017-02-18 NOTE — Progress Notes (Signed)
Physical Therapy Session Note  Patient Details  Name: Scott [name] MRN: [id number] Date of Birth: 1946/06/24  Today's Date: 02/18/2017 PT Individual Time: [id number] PT Individual Time Calculation (min): 60 min   Short Term Goals: Week 2:  PT Short Term Goal 1 (Week 2): =LTG due to estimated LOS  Skilled Therapeutic Interventions/Progress Updates:   Pt received sitting in WC and agreeable to PT  WC mobility in hall without cues or assist from PT. PT educated pt's wife in Geographical information systems officer with WC x 1 with PT and x 2 with pt's wife. Moderate cues for safety on first attempt and proper use of torso to prevent excessive posterior displacement.     Education also performed for WC parts management including arm rest, removable wheels,   Transfer training with SB with min guard from PT with min cues for maintaining proper head/hips relationship.   Bed mobility with supervision prone position and min assist to return to sitting.   Prone therex including Hip extension, and HS curl. Prolonged hip flexor stretch in prone x  38mnutes following therex.   [facility] too room and [facility] and all needs met.       Therapy Documentation Precautions:  Precautions Precautions: Fall, Other (comment) Precaution Comments: Left UE transmetacarpal amputation, R UE MCP amputation, bilateral BKA Restrictions Weight Bearing Restrictions: Yes RLE Weight Bearing: Non weight bearing LLE Weight Bearing: Non weight bearing General: PT Amount of Missed Time (min): 15 Minutes PT Missed Treatment Reason: Patient fatigue Vital Signs: Therapy Vitals Temp: 97.7 F (36.5 C) Temp Source: Oral Pulse Rate: 60 Resp: 18 BP: (!) 93/57 Patient Position (if appropriate): Lying Oxygen Therapy SpO2: 100 % O2 Device: Not Delivered Pain: Pain Assessment Pain Assessment: No/denies pain   See Function Navigator for Current Functional Status.   Therapy/Group:  Individual Therapy  [name]3/29/2018, 5:11 PM

## 2017-02-18 NOTE — Progress Notes (Signed)
Speech Language Pathology Discharge Summary  Patient Details  Name: CASMERE HOLLENBECK MRN: [id number] Date of Birth: 1946-11-16  Patient has met 5 of 5 long term goals.  Patient to discharge at overall Modified Independent level.   Reasons goals not met: N/A   Clinical Impression/Discharge Summary:   Patient has made excellent gains and has met 5 of 5 LTG's this admission. Currently, patient is consuming regular textures with thin liquids without overt s/s of aspiration and is Mod I for use of swallowing compensatory strategies. Patient is 100% intelligible at the conversation level and is overall Mod I for applying pressure to stoma to maximize vocal intensity. Patient is also overall Mod I to complete functional and familiar tasks safely in regards to problem solving, awareness and recall of functional information. Patient education is complete. Suspect patient is close to cognitive baseline, therefore, skilled SLP f/u is not warranted at this time.   Care Partner:  Caregiver Able to Provide Assistance: Yes     Recommendation: None         Equipment: N/A   Reasons for discharge: Treatment goals met   Patient/Family Agrees with Progress Made and Goals Achieved: Yes   Dixon, [place] 02/18/2017, 6:29 AM

## 2017-02-18 NOTE — Progress Notes (Signed)
Physical Therapy Discharge Summary  Patient Details  Name: Scott [name] MRN: [id number] Date of Birth: [birth year]  Today's Date: 02/18/2017 PT Individual Time: 1100-1200 PT Individual Time Calculation (min): 60 min    Patient has met 5 of 6 long term goals due to improved activity tolerance, improved balance, improved postural control, increased strength and ability to compensate for deficits.  Patient to discharge at a wheelchair level [place].   Patient's care partner is independent to provide the necessary physical assistance at discharge.  Reasons goals not met: Bed mobility goal adequate for d/c; requires minA which family can provide.  Recommendation:  Patient will benefit from ongoing skilled PT services in home health setting to continue to advance safe functional mobility, address ongoing impairments in strength, balance, coordination, activity tolerance, ROM, and minimize fall risk.  Equipment: W/c, transfer board, hospital bed  Reasons for discharge: treatment goals met and discharge from hospital  Patient/family agrees with progress made and goals achieved: Yes  PT Discharge Precautions/Restrictions Precautions Precautions: Fall;Other (comment) Precaution Comments: Left UE transmetacarpal amputation, R UE MCP amputation, bilateral BKA Restrictions Weight Bearing Restrictions: Yes RLE Weight Bearing: Non weight bearing LLE Weight Bearing: Non weight bearing Vital Signs Therapy Vitals Temp: 97.7 F (36.5 C) Temp Source: Oral Pulse Rate: 60 Resp: 18 BP: (!) 93/57 Patient Position (if appropriate): Lying Oxygen Therapy SpO2: 100 % O2 Device: Not Delivered Pain Pain Assessment Pain Assessment: No/denies pain Vision/Perception  Perception Comments: WFL  Cognition Overall Cognitive Status: Impaired/Different from baseline Arousal/Alertness: Awake/alert Orientation Level: Oriented X4 Sustained Attention: Appears intact Selective Attention: Appears  intact Memory: Impaired Memory Impairment: Decreased short term memory Decreased Short Term Memory: Verbal basic Awareness: Appears intact Problem Solving: Appears intact Safety/Judgment: Appears intact Sensation Sensation Light Touch: Appears Intact Stereognosis: Not tested Hot/Cold: Not tested Proprioception: Appears Intact Coordination Gross Motor Movements are Fluid and Coordinated: Yes Heel Shin Test: Not assessed d/t lacking distal limbs Motor  Motor Motor: Other (comment) Motor - Discharge Observations: generalized weakness, improving over baseline  Mobility Bed Mobility Bed Mobility: Rolling Right;Rolling Left;Supine to Sit;Sit to Supine Rolling Right: 5: Supervision Rolling Left: 5: Supervision Supine to Sit: 4: Min assist;HOB elevated Supine to Sit Details: Verbal cues for sequencing;Verbal cues for precautions/safety;Tactile cues for placement Sit to Supine: 5: Supervision Sit to Supine - Details: Verbal cues for precautions/safety;Verbal cues for gait pattern Transfers Transfers: Yes Lateral/Scoot Transfers: 4: Min assist;4: Min guard;With slide board Lateral/Scoot Transfer Details: Manual facilitation for placement;Verbal cues for precautions/safety;Verbal cues for safe use of DME/AE Lateral/Scoot Transfer Details (indicate cue type and reason): assist for placement of board, small boost at hips occasionally required to get far hip onto board Locomotion  Ambulation Ambulation: No Gait Gait: No Stairs / Additional Locomotion Stairs: Yes Curb: 1: +1 Total assist;Other (comment) (daughter performed bumping w/c up/down curb step) Product manager Mobility: Yes Wheelchair Assistance: 5: Personnel officer Assistance Details: Verbal cues for technique;Verbal cues for Information systems manager: Both upper extremities Wheelchair Parts Management: Needs assistance Distance: >150'  Trunk/Postural Assessment  Cervical  Assessment Cervical Assessment: Exceptions to [facility] (forward head) Thoracic Assessment Thoracic Assessment: Exceptions to [facility] (rounded shoulders, increased kyphosis) Lumbar Assessment Lumbar Assessment: Exceptions to [facility] (posterior pelvic tilt) Postural Control Postural Control: Within Functional Limits (in sitting)  Balance Balance Balance Assessed: Yes Static Sitting Balance Static Sitting - Level of Assistance: 6: Modified independent (Device/Increase time) Dynamic Sitting Balance Dynamic Sitting - Level of Assistance: 5: Stand by assistance Extremity Assessment  RUE Assessment  RUE Assessment: Exceptions to [facility] (generalized weakness, grossly 4-/5 throughout; defer to OT exam for details) LUE Assessment LUE Assessment: Exceptions to [facility] (generalized weakness, grossly 4-/5 throughout; defer to OT exam for details) RLE Assessment RLE Assessment: Exceptions to [facility] RLE AROM (degrees) RLE Overall AROM Comments: WFL for motions assessed, hip flexion, knee flexion/extension, able to achieve at least neutral hip extension  RLE Strength RLE Overall Strength Comments: 4/5 at hip and knee LLE Assessment LLE Assessment: Exceptions to [facility] LLE AROM (degrees) LLE Overall AROM Comments: WFL for motions assessed, hip flexion, knee flexion/extension, able to achieve at least neutral hip extension  LLE Strength LLE Overall Strength Comments: 4/5 in hip and knee for all motions proximal to distal   Skilled Therapeutic Intervention: Pt received seated in w/c with daughter present; denies pain and agreeable to treatment. Assessed all mobility as described above with daughter performing minA as needed throughout session. Educated pt and daughter in bumping w/c up/down one curb step; daughter practiced with therapist in chair and then x2 with pt in w/c. Donned theraband to w/c rims to improve grip during propulsion. Demonstrated to pt and daughter how to remove w/c wheels on new chair for putting w/c in  car; daughter able to return demonstration. Pt and daughter with no further questions/concerns regarding d/c at this time. Pt's wife to be present this afternoon for PM session with another therapist; encouraged pt to have wife try any other things she wanted to practice prior to d/c. Returned to room with daughter assisting at end of session.   See Function Navigator for Current Functional Status.  Benjiman Core Tygielski 02/18/2017, 4:12 PM

## 2017-02-18 NOTE — Progress Notes (Signed)
1500 02/17/17 nursing RN did DM teaching ; use of  insulin pen to daughter and wife; daughter and wife demonstrated insulin pen administration to RN.

## 2017-02-18 NOTE — Progress Notes (Signed)
Occupational Therapy Discharge Summary  Patient Details  Name: Scott [name] MRN: [id number] Date of Birth: 1946/02/16   Patient has met 10 of 10 long term goals due to improved activity tolerance, improved balance, postural control, ability to compensate for deficits and improved coordination.  Patient to discharge at [facility] Assist level.  Patient's care partner is independent to provide the necessary physical assistance at discharge.  Pt made excellent progress while on CIR. He currently requires min A for even/ down hill sliding board transfers and mod A for uneven transfer. Depending on where toileting task takes place (tub transfer bench with cut out vs. Use of urinal in chair) pt can require mod- total A for clothing management and hygiene. Pt's wife and family have completed hands on family education and have voiced and demonstrated ability ot provide needed care at d/c. Pt is using padded tub bench with cut out for toileting needs, completing lateral leans for clothing management. He is able to complete UB bathing/dressing and grooming tasks from w/c level at sink using universal cuff and bath mit with set-up assist. Have educated extensively regarding importance of maintaining skin integrity and boosting schedule.   Recommendation:  Patient will benefit from ongoing skilled OT services in home health setting to continue to advance functional skills in the area of BADL, iADL and Reduce care partner burden.  Equipment: Privately ordered padded tub bench with cut out for toileting needs, 30" sliding board, hospital bed  Reasons for discharge: treatment goals met and discharge from hospital  Patient/family agrees with progress made and goals achieved: Yes  OT Discharge Precautions/Restrictions  Precautions Precautions: Fall;Other (comment) Precaution Comments: Left UE transmetacarpal amputation, R UE MCP amputation, bilateral BKA Restrictions Weight Bearing Restrictions: Yes RLE  Weight Bearing: Non weight bearing LLE Weight Bearing: Non weight bearing ADL ADL ADL Comments: Please see functional navigator for ADL status Vision/Perception  Vision- History Baseline Vision/History: No visual deficits Wears Glasses: At all times Patient Visual Report: No change from baseline Vision- Assessment Vision Assessment?: No apparent visual deficits Perception Comments: WFL  Cognition Overall Cognitive Status: Impaired/Different from baseline Arousal/Alertness: Awake/alert Orientation Level: Oriented X4 Sustained Attention: Appears intact Selective Attention: Appears intact Memory: Impaired Memory Impairment: Decreased short term memory Decreased Short Term Memory: Verbal basic Awareness: Appears intact Problem Solving: Appears intact Safety/Judgment: Appears intact Sensation Sensation Light Touch: Appears Intact Stereognosis: Not tested Hot/Cold: Not tested Proprioception: Appears Intact Coordination Gross Motor Movements are Fluid and Coordinated: Yes Heel Shin Test: Not assessed d/t lacking distal limbs Motor  Motor Motor: Other (comment) Motor - Discharge Observations: generalized weakness, improving over baseline Mobility  Bed Mobility Bed Mobility: Rolling Right;Rolling Left;Supine to Sit;Sit to Supine Rolling Right: 5: Supervision Rolling Left: 5: Supervision Supine to Sit: 4: Min assist;HOB elevated Supine to Sit Details: Verbal cues for sequencing;Verbal cues for precautions/safety;Tactile cues for placement Sit to Supine: 5: Supervision Sit to Supine - Details: Verbal cues for precautions/safety;Verbal cues for gait pattern  Trunk/Postural Assessment  Cervical Assessment Cervical Assessment: Exceptions to [facility] (forward head) Thoracic Assessment Thoracic Assessment: Exceptions to [facility] (rounded shoulders, increased kyphosis) Lumbar Assessment Lumbar Assessment: Exceptions to [facility] (posterior pelvic tilt) Postural Control Postural Control: Within  Functional Limits (in sitting)  Balance Balance Balance Assessed: Yes Static Sitting Balance Static Sitting - Level of Assistance: 6: Modified independent (Device/Increase time) Dynamic Sitting Balance Dynamic Sitting - Level of Assistance: 5: Stand by assistance Extremity/Trunk Assessment RUE Assessment RUE Assessment: Exceptions to [facility] (Strength and ROM WFL; pt  with metacapral amputation with residual thumb) LUE Assessment LUE Assessment: Exceptions to WFL (ROM and strength WFL, metacarpal amputation eliminating all digits)   See Function Navigator for Current Functional Status.  Bobby Rumpf, Fatim Vanderschaaf C 02/18/2017, 4:42 PM

## 2017-02-19 ENCOUNTER — Inpatient Hospital Stay (HOSPITAL_COMMUNITY): Payer: Medicare Other | Admitting: Occupational Therapy

## 2017-02-19 DIAGNOSIS — E119 Type 2 diabetes mellitus without complications: Secondary | ICD-10-CM

## 2017-02-19 LAB — PROTIME-INR
INR: 2.93
Prothrombin Time: 31.2 seconds — ABNORMAL HIGH (ref 11.4–15.2)

## 2017-02-19 LAB — GLUCOSE, CAPILLARY: Glucose-Capillary: 87 mg/dL (ref 65–99)

## 2017-02-19 MED ORDER — INSULIN GLARGINE 100 UNIT/ML SOLOSTAR PEN: 5.0000 [IU] | PEN_INJECTOR | Freq: Two times a day (BID) | SUBCUTANEOUS | 0 refills | Status: DC

## 2017-02-19 MED ORDER — FUROSEMIDE 40 MG PO TABS: 40.0000 mg | ORAL_TABLET | Freq: Two times a day (BID) | ORAL | 0 refills | Status: DC

## 2017-02-19 MED ORDER — INSULIN PEN NEEDLE 32G X 5 MM MISC: 1.0000 "application " | Freq: Two times a day (BID) | 0 refills | Status: DC

## 2017-02-19 MED ORDER — WARFARIN SODIUM 2 MG PO TABS
2.0000 mg | ORAL_TABLET | Freq: Once | ORAL | Status: DC
  Filled 2017-02-19: qty 1

## 2017-02-19 MED ORDER — BLOOD GLUCOSE MONITOR KIT: PACK | 0 refills | Status: DC

## 2017-02-19 MED ORDER — COLLAGENASE 250 UNIT/GM EX OINT: TOPICAL_OINTMENT | Freq: Every day | CUTANEOUS | 1 refills | Status: DC

## 2017-02-19 MED ORDER — LISINOPRIL 2.5 MG PO TABS: 2.5000 mg | ORAL_TABLET | Freq: Every day | ORAL | 0 refills | Status: DC

## 2017-02-19 MED ORDER — GABAPENTIN 300 MG PO CAPS: 300.0000 mg | ORAL_CAPSULE | Freq: Two times a day (BID) | ORAL | 0 refills | Status: DC

## 2017-02-19 MED ORDER — SERTRALINE HCL 25 MG PO TABS: 75.0000 mg | ORAL_TABLET | Freq: Every day | ORAL | 0 refills | Status: DC

## 2017-02-19 MED ORDER — SERTRALINE HCL 50 MG PO TABS: 75.0000 mg | ORAL_TABLET | Freq: Every day | ORAL | 0 refills | Status: DC

## 2017-02-19 MED ORDER — HYDROCERIN EX CREA: 1.0000 "application " | TOPICAL_CREAM | Freq: Two times a day (BID) | CUTANEOUS | 0 refills | Status: DC

## 2017-02-19 MED ORDER — FERROUS SULFATE 325 (65 FE) MG PO TABS: 325.0000 mg | ORAL_TABLET | Freq: Two times a day (BID) | ORAL | 0 refills | Status: DC

## 2017-02-19 MED ORDER — POTASSIUM CHLORIDE CRYS ER 20 MEQ PO TBCR: 20.0000 meq | EXTENDED_RELEASE_TABLET | Freq: Two times a day (BID) | ORAL | 0 refills | Status: DC

## 2017-02-19 MED ORDER — ASCORBIC ACID 500 MG PO TABS: 500.0000 mg | ORAL_TABLET | Freq: Two times a day (BID) | ORAL | 0 refills | Status: DC

## 2017-02-19 MED ORDER — DIGOXIN 125 MCG PO TABS: 0.1250 mg | ORAL_TABLET | Freq: Every day | ORAL | 0 refills | Status: DC

## 2017-02-19 MED ORDER — AMIODARONE HCL 100 MG PO TABS: 100.0000 mg | ORAL_TABLET | Freq: Every day | ORAL | 0 refills | Status: DC

## 2017-02-19 MED ORDER — FAMOTIDINE 20 MG PO TABS: 20.0000 mg | ORAL_TABLET | Freq: Two times a day (BID) | ORAL | 0 refills | Status: DC

## 2017-02-19 MED ORDER — ZOLPIDEM TARTRATE 5 MG PO TABS: 5.0000 mg | ORAL_TABLET | Freq: Every day | ORAL | 0 refills | Status: DC

## 2017-02-19 MED ORDER — OXYCODONE HCL 5 MG PO TABA: 5.0000 mg | ORAL_TABLET | Freq: Four times a day (QID) | ORAL | 0 refills | Status: DC | PRN

## 2017-02-19 MED ORDER — WARFARIN SODIUM 2 MG PO TABS: 2.0000 mg | ORAL_TABLET | Freq: Every day | ORAL | 0 refills | Status: DC

## 2017-02-19 MED ORDER — FLUTICASONE PROPIONATE 50 MCG/ACT NA SUSP: 1.0000 | Freq: Every day | NASAL | 2 refills | Status: DC

## 2017-02-19 MED ORDER — ZINC SULFATE 220 (50 ZN) MG PO CAPS: 220.0000 mg | ORAL_CAPSULE | Freq: Two times a day (BID) | ORAL | 0 refills | Status: DC

## 2017-02-19 MED ORDER — ATORVASTATIN CALCIUM 40 MG PO TABS: 40.0000 mg | ORAL_TABLET | Freq: Every day | ORAL | 0 refills | Status: DC

## 2017-02-19 MED ORDER — ATENOLOL 25 MG PO TABS: 25.0000 mg | ORAL_TABLET | Freq: Every day | ORAL | 0 refills | Status: DC

## 2017-02-19 MED ORDER — LORATADINE 10 MG PO TABS: 10.0000 mg | ORAL_TABLET | Freq: Every day | ORAL | 0 refills | Status: DC

## 2017-02-19 NOTE — Progress Notes (Signed)
Pt's wife and daughter present at bedside to do dressing change. Wife demonstrated correct wound dressing procedure and verbalized understanding of all wound care needs. Pt given discharge instructions and  Pt ready for discharge.

## 2017-02-19 NOTE — Progress Notes (Signed)
[facility]     PROGRESS NOTE    Subjective/Complaints: Up in chair. No new complaints. Excited to be going home.   ROS: pt denies nausea, vomiting, diarrhea, cough, shortness of breath or chest pain       Objective: Vital Signs: Blood pressure 106/68, pulse 60, temperature 98.1 F (36.7 C), temperature source Oral, resp. rate 18, height 5' (1.524 m), weight 76 kg (167 lb 8.8 oz), SpO2 100 %. No results found. No results for input(s): WBC, HGB, HCT, PLT in the last 72 hours.  Recent Labs  02/18/17 0528  NA 140  K 3.2*  CL 103  GLUCOSE 104*  BUN 14  CREATININE 0.83  CALCIUM 8.7*   CBG (last 3)   Recent Labs  02/18/17 1644 02/18/17 2104 02/19/17 0630  GLUCAP 106* 141* 87    Wt Readings from Last 3 Encounters:  02/19/17 76 kg (167 lb 8.8 oz)  01/31/17 101 kg (222 lb 10.6 oz)  02/04/17 101 kg (222 lb 10.6 oz)    Physical Exam:  Constitutional: He is oriented to person, place, and time. He appears well-developed and well-nourished.  No distress  HENT:  Head: Normocephalic and atraumatic.  Eyes: Conjunctivae are normal. Pupils are equal, round, and reactive to light.  Neck: Normal range of motion. Neck supple.  Stoma dressed, packed with iodoform---no leakage Cardiovascular: RRR   Respiratory: CTA B    GI: Soft. Bowel sounds are normal. He exhibits no distension. There is no tenderness.  PEG  Site closed Genitourinary:  Genitourinary Comments: scrotum packed/stable ---granulating in nicely Musculoskeletal:  B-BKA incisions-gradually improving. Pink granulation underneath eschar which is loosening up. Right thigh wound with granulation. Right MCP sites healing with scab--stable Neurological: He is alert and oriented to person, place, and time. Coordination abnormal.  Speech clear. Reasonable insight and awareness. UE limited by discomfort to an extent. B/L deltoid, biceps, triceps are 4/5. Wrist and fingers limited by  splints and amputations. LE: 4/5 HF, 4/5 KE.   Skin: Scrotal wound with drainage from drain site buttocks reddened, scarred.    Psychiatric: pleasant and appropriate    Assessment/Plan: 1. Debility and mobility/functional deficits secondary to multiple medical issues, 4 limb amputations which require 3+ hours per day of interdisciplinary therapy in a comprehensive inpatient rehab setting. Physiatrist is providing close team supervision and 24 hour management of active medical problems listed below. Physiatrist and rehab team continue to assess barriers to discharge/monitor patient progress toward functional and medical goals.  Function:  Bathing Bathing position   Position: Shower  Bathing parts Body parts bathed by patient: Right arm, Right upper leg, Left arm, Left upper leg, Chest, Abdomen, Front perineal area, Buttocks Body parts bathed by helper: Back  Bathing assist Assist Level: Supervision or verbal cues      Upper Body Dressing/Undressing Upper body dressing   What is the patient wearing?: Pull over shirt/dress     Pull over shirt/dress - Perfomed by patient: Thread/unthread left sleeve, Put head through opening, Thread/unthread right sleeve, Pull shirt over trunk Pull over shirt/dress - Perfomed by helper: Put head through opening, Pull shirt over trunk        Upper body assist Assist Level: Set up   Set up : To obtain clothing/put away  Lower Body Dressing/Undressing Lower body dressing   What is the patient wearing?: Pants, Underwear Underwear - Performed by patient: Thread/unthread right underwear leg, Thread/unthread left underwear leg, Pull underwear up/down Underwear - Performed by helper:  Thread/unthread right underwear leg, Thread/unthread left underwear leg, Pull underwear up/down Pants- Performed by patient: Thread/unthread right pants leg, Thread/unthread left pants leg, Pull pants up/down Pants- Performed by helper: Pull pants up/down                       Lower body assist Assist for lower body dressing: Supervision or verbal cues, Set up   Set up : To obtain clothing/put away  Toileting Toileting Toileting activity did not occur: No continent bowel/bladder event (simulated for family education) Toileting steps completed by patient: Adjust clothing prior to toileting Toileting steps completed by helper: Performs perineal hygiene, Adjust clothing after toileting    Toileting assist Assist level: Touching or steadying assistance (Pt.75%)   Transfers Chair/bed transfer   Chair/bed transfer method: Lateral scoot Chair/bed transfer assist level: Touching or steadying assistance (Pt > 75%) Chair/bed transfer assistive device: Sliding board, Armrests     Locomotion Ambulation Ambulation activity did not occur: Safety/medical concerns         Wheelchair   Type: Manual Max wheelchair distance: 150 Assist Level: Supervision or verbal cues  Cognition Comprehension Comprehension assist level: Follows complex conversation/direction with extra time/assistive device  Expression Expression assist level: Expresses complex ideas: With extra time/assistive device  Social Interaction Social Interaction assist level: Interacts appropriately with others - No medications needed.  Problem Solving Problem solving assist level: Solves complex problems: With extra time  Memory Memory assist level: More than reasonable amount of time   Medical Problem List and Plan: 1.  Deconditioning and functional/mobility deficits secondary to complications after CABG/AVR with subsequent respiratory failure as well as ischemic injury to all 4 limbs which required bilateral BKA's and bilateral TM-C amputations.              -dc home today  - needs family ed with RN re: wounds before d/c 2.  RV thrombus/Anticoagulation: Pharmaceutical: Coumadin   3. Pain Management: Prn medications effective.  4. Mood: Motivated to get stronger. LCSW to follow for  evaluation and support.  5. Neuropsych: This patient is capable of making decisions on his own behalf. 6. Skin/Wound Care: Monitor wounds for healing.     -continue pink tape/occlusive dressing. -pack with iodoform gauze! 7. Fluids/Electrolytes/Nutrition: good po intake  -PEG out and site healed 8. CAD s/p AVR: continue IV heparin till INR therapeutic.On ASA, Atenolol and Lipitor.  9. A fib: Monitor HR bid. Continue digoxin, atenolol and amiodarone.  10. B-BKA, Left transmetatarsal and right MCP amputations due to DIC:               -continue santyl dressing b/l BKA's. Left knee wound. Reviewed wound care with RN today.  - -continue santyl to stumps at home  -hand wounds healing without issues- .  11. 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 Diarrhea: generally mproving on lomotil prn 13. Anemia of chronic illness: Slowly improving --10.6 14. T2DM: Monitor BS ac/hs. Continue lantus insulin. Sugars under reasonable control  -SSI 15. HTN: Monitor BP bid--on lisinopril, lasix, digoxin and atenolol  16. Uro: wound care as above  -bladder emptying          LOS (Days) 14 A FACE TO FACE EVALUATION WAS PERFORMED  Scott Simons T, MD 02/19/2017 10:01 AM

## 2017-02-19 NOTE — Progress Notes (Signed)
ANTICOAGULATION CONSULT NOTE - Follow Up Consult  Pharmacy Consult for coumadin Indication: afib and RV thrombus  Allergies  Allergen Reactions  . Doxycycline Hives  . Penicillins Hives     Has patient had a PCN reaction causing immediate rash, facial/tongue/throat swelling, SOB or lightheadedness with hypotension: No Has patient had a PCN reaction causing severe rash involving mucus membranes or skin necrosis: No Has patient had a PCN reaction that required hospitalization: No Has patient had a PCN reaction occurring within the last 10 years:# # # YES # # #  If all of the above answers are "NO", then may proceed with Cephalosporin use.   . Sulfa Antibiotics Hives    Patient Measurements: Height: 5' (152.4 cm) (estimate) Weight: 167 lb 8.8 oz (76 kg) IBW/kg (Calculated) : 50 Heparin Dosing Weight:   Vital Signs: Temp: 98.1 F (36.7 C) (03/30 0533) Temp Source: Oral (03/30 0533) BP: 106/68 (03/30 0533) Pulse Rate: 60 (03/30 0533)  Labs:  Recent Labs  02/17/17 0549 02/18/17 0528 02/19/17 0616  LABPROT 29.1* 32.6* 31.2*  INR 2.69 3.09 2.93  CREATININE  --  0.83  --     Estimated Creatinine Clearance: 70.7 mL/min (by C-G formula based on SCr of 0.83 mg/dL).   Medications:  Scheduled:  . amiodarone  100 mg Oral Daily  . aspirin EC  81 mg Oral Daily  . atenolol  25 mg Oral Daily  . atorvastatin  40 mg Oral q1800  . collagenase   Topical Daily  . digoxin  0.125 mg Oral Daily  . famotidine  20 mg Oral BID  . ferrous sulfate  325 mg Oral BID WC  . fluticasone  1 spray Each Nare Daily  . furosemide  40 mg Oral BID  . gabapentin  300 mg Oral BID  . hydrocerin   Topical BID  . insulin aspart  0-5 Units Subcutaneous QHS  . insulin aspart  0-9 Units Subcutaneous TID WC  . insulin glargine  5 Units Subcutaneous BID  . insulin starter kit- pen needles  1 kit Other Once  . lisinopril  2.5 mg Oral Daily  . loratadine  10 mg Oral Daily  . Melatonin  3 mg Oral QHS  .  potassium chloride  20 mEq Oral BID  . rOPINIRole  2 mg Oral QHS  . sertraline  75 mg Oral Daily  . vitamin C  500 mg Oral Daily  . Warfarin - Pharmacist Dosing Inpatient   Does not apply q1800  . zinc sulfate  220 mg Oral BID  . zolpidem  5 mg Oral QHS   Infusions:    Assessment: 71 yo male with afib and RV thrombus is currently on therapeutic coumadin.  INR today is 2.93.  Goal of Therapy:  INR 2-3 Monitor platelets by anticoagulation protocol: Yes   Plan:  - coumadin 2 mg po x1 - INR in am  Beulah Capobianco, Tsz-Yin 02/19/2017,8:24 AM

## 2017-02-19 NOTE — Discharge Summary (Signed)
Physician Discharge Summary  Patient ID: Scott [name] MRN: [id number] DOB/AGE: 71-Mar-1947 71 y.o.  Admit date: 02/05/2017 Discharge date: 02/19/2017  Discharge Diagnoses:  Principal Problem:   Debility Active Problems:   Hypertension   S/P aortic valve replacement with bioprosthetic valve   S/P mitral valve repair   Hx of CABG   Postoperative atrial fibrillation (HCC)   Acute combined systolic and diastolic congestive heart failure ([place])   DIC (disseminated intravascular coagulation) (HCC)   Thrombosis of both upper extremities   Status post bilateral below knee amputation ([place])   Wound disruption, post-op, skin, sequela   Diabetes ([place])   Discharged Condition: stable   Significant Diagnostic Studies: No results found.  Labs:  Basic Metabolic Panel: BMP Latest Ref Rng & Units 02/18/2017 02/11/2017 02/11/2017  Glucose 65 - 99 mg/dL 104(H) 122(H) 105(H)  BUN 6 - 20 mg/dL '14 18 16  ' Creatinine 0.61 - 1.24 mg/dL 0.83 1.17 0.97  Sodium 135 - 145 mmol/L 140 137 139  Potassium 3.5 - 5.1 mmol/L 3.2(L) 3.4(L) 3.1(L)  Chloride 101 - 111 mmol/L 103 101 103  CO2 22 - 32 mmol/L '28 26 25  ' Calcium 8.9 - 10.3 mg/dL 8.7(L) 8.8(L) 8.6(L)    CBC: CBC Latest Ref Rng & Units 02/15/2017 02/05/2017 02/02/2017  WBC 4.0 - 10.5 K/uL 7.1 10.9(H) 10.3  Hemoglobin 13.0 - 17.0 g/dL 10.6(L) 8.4(L) 9.1(L)  Hematocrit 39.0 - 52.0 % 36.0(L) 28.5(L) 31.1(L)  Platelets 150 - 400 K/uL 249 357 355    CBG:  Recent Labs Lab 02/18/17 0620 02/18/17 1209 02/18/17 1644 02/18/17 2104 02/19/17 0630  GLUCAP 95 113* 106* 141* 87    Brief HPI:   Scott Mcanany Turneris a 71 year old male with history of T2DM, heart murmur, BPH, RLS, peyronie's disease, bicuspid aortic valve with severe stenosis who was originally admitted to [facility] on 10/20/16 for cardiac work up of CP. He was found to have 3 vessel CAD with borderline critical AS and thoracic ascending aneurysm.  He was taken to OR on 10/22/16 for CABG X 3, AVR,  MVR, repair of thoracic ascending aneurysm with placement of VAC on open chest. Post op course significant for cardiogenic shock requiring multiple pressors, SAM requiring removal of mitral annuloplasty ring,  persistent fevers, encephalopathy, shocked liver, DIC due to consumptive intraoperative coagulopathy, diffuse clotting with ischemia of distal BUE/BLE extremities, A fib, C diff colitis, volume overload and VDRF with difficulty with vent wean requiring tracheostomy.  Sternal wound closed on 12/4 and was started on coumadin for RV thrombus and A fib.    He was transferred to [facility] on 12/26 for vent wean.  Hospital course significant for MRSA bacteremia, pneumonitis, intermittent fevers, dysphagia requiring PEG tube placement on 1/15 by Dr. Kathlene Cote. He developed progressive gangrenous changes of BUE and BLE requiring B-transtibial amputation , left transmetacarpal amputation and Right MCP amputation of right hand by Dr. Sharol Given on 1/19.  He tolerated extubation to ATC and was de-cannulated on 3/12.  He developed phimosis with erosion of penile prothesis and purulent drainage and despite antibiotics. Scrotal wound positive for few pseudomonas aeruginosa and moderate bacteroides fragilis and was started on IV antibiotics. Once INR normalized, he underwent removal of penile prosthesis with placemen of penrose drain by Dr. Karsten Ro and amputation of index MC by Dr. Sharol Given on 3/12. He was showing improvement in activity tolerance and CIR was recommended for follow up therapy.    Hospital Course: Scott [name] was admitted to rehab 02/05/2017 for inpatient  therapies to consist of PT, ST and OT at least three hours five days a week. Past admission physiatrist, therapy team and rehab RN have worked together to provide customized collaborative inpatient rehab. He has been afebrile during his stay and was maintained on cipro prior for 10 total days of antibiotic therapy. Diabetes has been monitored with ac/hs checks and  blood sugars have been stable on lantus bid. Foley was discontinue don 3/19 and patient has been voiding witthout difficulty. Po intake has been good and PEG tube was removed without difficulty on 3/22. Prior PEG site has healed well and tracheal stoma is slowly closing in with residual small open area which has been covered with occlusive dressing.  He has tolerated increase in activity without any cardiac symptoms and heart rate has been controlled. Dr. Warren Lacy has followed for input and repeat echo shows EF 55-60% with normal functioning bioprosthetic aortic valve.   Midline scrotal incision was noted to have copious serious drainage with undermining. His has been paced with 2 X 2 kerlix and is closing in slowly. Drainage has resolved and dressing changes were decreased to daily. R-BKA site showed fibro-necrotic tissue with dehiscence along lateral aspect as well as some fibro necrotic tissue along L-BKA incision. This has been managed with santyl and wet to dry dressing. Sutures right hand were removed on 3/28 and incision is C/D/I.  Respiratory status has been stable and fluid overload has been compensated. He continues on lasix bid and K dur was increased to bid due to hypokalemia. Pharmacy has assisted with management of coumadin and INR is therapeutic at discharge. He was discharged on 2 mg daily and HHRN to draw protime on 4/2 with results to [place] coumadin clinic. Follow up CBC showed that ABLA is resolving. He has progressed to min assist level at wheelchair level and will continue to receive follow up [place], [place] and [place] by [facility] after discharge.    Rehab course: During patient's stay in rehab weekly team conferences were held to monitor patient's progress, set goals and discuss barriers to discharge. He required max assist of basic self care tasks and total assist with mobility.  Speech therapy has focused on expression, diet tolerance and He has had improvement in activity tolerance,  balance, postural control, as well as ability to compensate for deficits. He is able to complete ADL tasks with min assist. He is able to perform transfers with use of sliding board and min assist. He is able to propel his wheelchair with min assist. His speech is 100% intelligible and he is tolerating regular diet without difficulty. Family education was completed with wife and daughters regarding all aspects of care.     Disposition: Home   Diet: Heart Healthy/Diabetic   Special Instructions: 1. Use santyl with damp to dry dressing on amputation sites daily. 2.  Pack scrotal wound with 2 X 2 kerlix daily. Contact MD if drainage increases.  3.  Check blood sugars 2-3 times a day and record.  4.  HHRN to draw Protime and BMET on 4/2 with results to [place] coumadin clinic [phone])   Discharge Instructions    Ambulatory referral to Physical Medicine Rehab    Complete by:  As directed    1-2 weeks transitional care appt      Allergies as of 02/19/2017      Reactions   Doxycycline Hives   Penicillins Hives   Has patient had a PCN reaction causing immediate rash, facial/tongue/throat swelling, SOB or  lightheadedness with hypotension: No Has patient had a PCN reaction causing severe rash involving mucus membranes or skin necrosis: No Has patient had a PCN reaction that required hospitalization: No Has patient had a PCN reaction occurring within the last 10 years:# # # YES # # #  If all of the above answers are "NO", then may proceed with Cephalosporin use.   Sulfa Antibiotics Hives      Medication List    STOP taking these medications   allopurinol 300 MG tablet Commonly known as:  ZYLOPRIM   CAL-MAG-ZINC PO   enalapril 20 MG tablet Commonly known as:  VASOTEC   finasteride 5 MG tablet Commonly known as:  PROSCAR   Fish Oil 1000 MG Cpdr   gemfibrozil 600 MG tablet Commonly known as:  LOPID   metFORMIN 500 MG 24 hr tablet Commonly known as:  GLUCOPHAGE-XR    metoprolol succinate 25 MG 24 hr tablet Commonly known as:  TOPROL XL   Vitamin D3 1000 units Caps   vitamin E 400 UNIT capsule     TAKE these medications   amiodarone 100 MG tablet Commonly known as:  PACERONE Take 1 tablet (100 mg total) by mouth daily. Notes to patient:  For heart/A fib   ascorbic acid 500 MG tablet Commonly known as:  VITAMIN C Take 1 tablet (500 mg total) by mouth 2 (two) times daily. Notes to patient:  This is to help with wound healing--can discontinue as wounds start closing in.    aspirin 81 MG tablet Take 81 mg by mouth 2 (two) times daily.   atenolol 25 MG tablet Commonly known as:  TENORMIN Take 1 tablet (25 mg total) by mouth daily. Notes to patient:  For heart/A fib   atorvastatin 40 MG tablet Commonly known as:  LIPITOR Take 1 tablet (40 mg total) by mouth daily at 6 PM.   blood glucose meter kit and supplies Kit Dispense based on patient and insurance preference. Use up to four times daily as directed. (FOR ICD-9 250.00, 250.01).   collagenase ointment Commonly known as:  SANTYL Apply topically daily. Notes to patient:  Apply to yellow slough in incisions, cover with damp 4 X 4. Then cover with dry dressing.    digoxin 0.125 MG tablet Commonly known as:  LANOXIN Take 1 tablet (0.125 mg total) by mouth daily. Notes to patient:  For Heart/A fib   famotidine 20 MG tablet Commonly known as:  PEPCID Take 1 tablet (20 mg total) by mouth 2 (two) times daily. Notes to patient:  For stomach/reflux   ferrous sulfate 325 (65 FE) MG tablet Take 1 tablet (325 mg total) by mouth 2 (two) times daily with a meal.   fluticasone 50 MCG/ACT nasal spray Commonly known as:  FLONASE Place 1 spray into both nostrils daily.   furosemide 40 MG tablet Commonly known as:  LASIX Take 1 tablet (40 mg total) by mouth 2 (two) times daily. Notes to patient:  Blood pressure/ Fluid overload   gabapentin 300 MG capsule Commonly known as:  NEURONTIN Take  1 capsule (300 mg total) by mouth 2 (two) times daily. Notes to patient:  For nerve pain/phantom pain   hydrocerin Crea Apply 1 application topically 2 (two) times daily.   Insulin Glargine 100 UNIT/ML Solostar Pen Commonly known as:  LANTUS Inject 5 Units into the skin 2 (two) times daily. Notes to patient:  Continue this till advised by MD   Insulin Pen Needle 32G X 5 MM  Misc Commonly known as:  CAREFINE PEN NEEDLES 1 application by Does not apply route 2 (two) times daily.   lisinopril 2.5 MG tablet Commonly known as:  PRINIVIL,ZESTRIL Take 1 tablet (2.5 mg total) by mouth daily. Notes to patient:  Blood pressure/Heart   loratadine 10 MG tablet Commonly known as:  CLARITIN Take 1 tablet (10 mg total) by mouth daily.   Melatonin 10 MG Caps Take 10 mg by mouth at bedtime.   nitroGLYCERIN 0.4 MG SL tablet Commonly known as:  NITROSTAT Place 0.4 mg under the tongue every 5 (five) minutes as needed.   potassium chloride SA 20 MEQ tablet Commonly known as:  K-DUR,KLOR-CON Take 1 tablet (20 mEq total) by mouth 2 (two) times daily. Notes to patient:  For supplement   rOPINIRole 2 MG tablet Commonly known as:  REQUIP Take 2 mg by mouth See admin instructions. Takes 45m at 4:30pm and 260mat 9:30pm.   sertraline 50 MG tablet Commonly known as:  ZOLOFT Take 1.5 tablets (75 mg total) by mouth daily.   Turmeric 500 MG Caps Take 500 mg by mouth 2 (two) times daily.   warfarin 2 MG tablet Commonly known as:  COUMADIN Take 1 tablet (2 mg total) by mouth daily at 6 PM.   zinc sulfate 220 (50 Zn) MG capsule Take 1 capsule (220 mg total) by mouth 2 (two) times daily.   zolpidem 5 MG tablet Commonly known as:  AMBIEN Take 1 tablet (5 mg total) by mouth at bedtime.      Follow-up Information    SWMeredith StaggersMD Follow up.   Specialty:  Physical Medicine and Rehabilitation Why:  offic will call you with follow up appointment Contact information: [phone]        OTClaybon JabsMD. Call in 1 day(s).   Specialty:  Urology Why:  for follow up appointment Contact information: [address] [phone]        Marcus V Duda, MD Follow up in 2 week(s).   Specialty:  Orthopedic Surgery Contact information: [phone]        ClRexene AlbertsMD. Call in 1 day(s).   Specialty:  Cardiothoracic Surgery Why:  for follow up appointment Contact information: [address] [phone]      SAManon HildingMD. Call in 3 day(s).   Specialty:  Family Medicine Why:  for post hospital follow up Contact information: [phone]        SaRozann LeschesMD. Call.   Specialty:  Cardiology Why:  for follow up appointment in 1-2 weeks Contact information: [phone]        [facility] Follow up.   Why:  [place].  Contact information: [address] [phone]        Signed: LoBary Leriche/30/2018, 5:07 PM

## 2017-02-19 NOTE — Progress Notes (Signed)
Social Work  Discharge Note  The overall goal for the admission was met for:   Discharge location: Yes - home with wife and daughters able to provide 24/7 assistance.  Length of Stay: Yes - 14 days  Discharge activity level: Yes - -min assist w/c level overall  Home/community participation: Yes  Services provided included: MD, RD, PT, OT, SLP, RN, TR, Pharmacy, [place]: Medicare and Private Insurance: [facility]  Follow-up services arranged: Home Health: RN, PT< OT via [facility], DME: [phone] lightweight w/c with Ulice Dash 3 posterior back, Jay fusion seat cushion, hospital bed, 30" transfer board via [place];  wife ordered padded tub bench with cut out via [employer] and Patient/Family has no preference for HH/DME agencies  Comments (or additional information):  Patient/Family verbalized understanding of follow-up arrangements: Yes  Individual responsible for coordination of the follow-up plan: pt  Confirmed correct DME delivered: Abdiel Blackerby 02/19/2017    Erica Richwine

## 2017-02-19 NOTE — Discharge Instructions (Signed)
Inpatient Rehab Discharge Instructions  Scott [name] Discharge date and time:  02/19/17  Activities/Precautions/ Functional Status: Activity: no lifting, driving, or strenuous exercise for till cleared by MD. Diet: cardiac diet   Wound Care: 1.  Apply santyl to yellow slough on amputation sites. Cover with damp to dry dressing.                         2.  Pack scrotum with 2 inch kerlix--use Q tip to fully pack area.                         3.  Contact MD if you develop any problems with your incision/wound--redness, swelling, increase in pain, drainage or if you develop fever or chills.    Functional status:  ___ No restrictions     ___ Walk up steps independently _X__ 24/7 supervision/assistance   ___ Walk up steps with assistance ___ Intermittent supervision/assistance  ___ Bathe/dress independently ___ Walk with walker     _X__ Bathe/dress with assistance ___ Walk Independently    ___ Shower independently ___ Walk with assistance    ___ Shower with assistance _X__ No alcohol     ___ Return to work/school ________    COMMUNITY REFERRALS UPON DISCHARGE:    Home Health:   PT     OT    RN                     Agency: [facility]     Phone:  [phone]   Medical Equipment/Items Ordered:  Wheelchair, hospital bed, transfer board                                                      Agency/Supplier: [facility] @ [phone]        Special Instructions: 1. Check blood sugars 2- 3 times a day ( before meals or at bedtime)  and record.  2. Use protein supplement (or protein snacks)  to help with wound healing.  3. Coumadin clinic in [place] will contact you on Monday for directions on coumadin dose.    My questions have been answered and I understand these instructions. I will adhere to these goals and the provided educational materials after my discharge from the hospital.  Patient/Caregiver Signature _______________________________ Date  __________  Clinician Signature _______________________________________ Date __________  Please bring this form and your medication list with you to all your follow-up doctor's appointments.    Information on my medicine - Coumadin   (Warfarin)  This medication education was reviewed with me or my healthcare representative as part of my discharge preparation.    Why was Coumadin prescribed for you? Coumadin was prescribed for you because you have a blood clot or a medical condition that can cause an increased risk of forming blood clots. Blood clots can cause serious health problems by blocking the flow of blood to the heart, lung, or brain. Coumadin can prevent harmful blood clots from forming. As a reminder your indication for Coumadin is:   Stroke Prevention Because Of Atrial Fibrillation  What test will check on my response to Coumadin? While on Coumadin (warfarin) you will need to have an INR test regularly to ensure that your dose is keeping you  in the desired range. The INR (international normalized ratio) number is calculated from the result of the laboratory test called prothrombin time (PT).  If an INR APPOINTMENT HAS NOT ALREADY BEEN MADE FOR YOU please schedule an appointment to have this lab work done by your health care provider within 7 days. Your INR goal is usually a number between:  2 to 3 or your provider may give you a more narrow range like 2-2.5.  Ask your health care provider during an office visit what your goal INR is.  What  do you need to  know  About  COUMADIN? Take Coumadin (warfarin) exactly as prescribed by your healthcare provider about the same time each day.  DO NOT stop taking without talking to the doctor who prescribed the medication.  Stopping without other blood clot prevention medication to take the place of Coumadin may increase your risk of developing a new clot or stroke.  Get refills before you run out.  What do you do if you miss a dose? If you  miss a dose, take it as soon as you remember on the same day then continue your regularly scheduled regimen the next day.  Do not take two doses of Coumadin at the same time.  Important Safety Information A possible side effect of Coumadin (Warfarin) is an increased risk of bleeding. You should call your healthcare provider right away if you experience any of the following: ? Bleeding from an injury or your nose that does not stop. ? Unusual colored urine (red or dark brown) or unusual colored stools (red or black). ? Unusual bruising for unknown reasons. ? A serious fall or if you hit your head (even if there is no bleeding).  Some foods or medicines interact with Coumadin (warfarin) and might alter your response to warfarin. To help avoid this: ? Eat a balanced diet, maintaining a consistent amount of Vitamin K. ? Notify your provider about major diet changes you plan to make. ? Avoid alcohol or limit your intake to 1 drink for women and 2 drinks for men per day. (1 drink is 5 oz. wine, 12 oz. beer, or 1.5 oz. liquor.)  Make sure that ANY health care provider who prescribes medication for you knows that you are taking Coumadin (warfarin).  Also make sure the healthcare provider who is monitoring your Coumadin knows when you have started a new medication including herbals and non-prescription products.  Coumadin (Warfarin)  Major Drug Interactions  Increased Warfarin Effect Decreased Warfarin Effect  Alcohol (large quantities) Antibiotics (esp. Septra/Bactrim, Flagyl, Cipro) Amiodarone (Cordarone) Aspirin (ASA) Cimetidine (Tagamet) Megestrol (Megace) NSAIDs (ibuprofen, naproxen, etc.) Piroxicam (Feldene) Propafenone (Rythmol SR) Propranolol (Inderal) Isoniazid (INH) Posaconazole (Noxafil) Barbiturates (Phenobarbital) Carbamazepine (Tegretol) Chlordiazepoxide (Librium) Cholestyramine (Questran) Griseofulvin Oral Contraceptives Rifampin Sucralfate (Carafate) Vitamin K    Coumadin (Warfarin) Major Herbal Interactions  Increased Warfarin Effect Decreased Warfarin Effect  Garlic Ginseng Ginkgo biloba Coenzyme Q10 Green tea St. Johns wort    Coumadin (Warfarin) FOOD Interactions  Eat a consistent number of servings per week of foods HIGH in Vitamin K (1 serving =  cup)  Collards (cooked, or boiled & drained) Kale (cooked, or boiled & drained) Mustard greens (cooked, or boiled & drained) Parsley *serving size only =  cup Spinach (cooked, or boiled & drained) Swiss chard (cooked, or boiled & drained) Turnip greens (cooked, or boiled & drained)  Eat a consistent number of servings per week of foods MEDIUM-HIGH in Vitamin K (1 serving = 1 cup)  Asparagus (cooked, or boiled & drained) Broccoli (cooked, boiled & drained, or raw & chopped) Brussel sprouts (cooked, or boiled & drained) *serving size only =  cup Lettuce, raw (green leaf, endive, romaine) Spinach, raw Turnip greens, raw & chopped   These websites have more information on Coumadin (warfarin):  FailFactory.se; VeganReport.com.au;

## 2017-02-19 NOTE — Progress Notes (Signed)
Wife is coming in this am to learn about dressing changes and how to do this for her husband. The RN, Angelena, requested for me to wait for the wife to come in before the patients dressings were changed this day. He is scheduled to be discharged to home this day or tomorrow. 0pt is sleeping at present without complaints.

## 2017-02-20 DIAGNOSIS — D649 Anemia, unspecified: Secondary | ICD-10-CM | POA: Diagnosis not present

## 2017-02-20 DIAGNOSIS — T8781 Dehiscence of amputation stump: Secondary | ICD-10-CM | POA: Diagnosis not present

## 2017-02-20 DIAGNOSIS — E1151 Type 2 diabetes mellitus with diabetic peripheral angiopathy without gangrene: Secondary | ICD-10-CM | POA: Diagnosis not present

## 2017-02-20 DIAGNOSIS — T8189XD Other complications of procedures, not elsewhere classified, subsequent encounter: Secondary | ICD-10-CM | POA: Diagnosis not present

## 2017-02-20 DIAGNOSIS — I2510 Atherosclerotic heart disease of native coronary artery without angina pectoris: Secondary | ICD-10-CM | POA: Diagnosis not present

## 2017-02-20 DIAGNOSIS — I4891 Unspecified atrial fibrillation: Secondary | ICD-10-CM | POA: Diagnosis not present

## 2017-02-22 ENCOUNTER — Ambulatory Visit (INDEPENDENT_AMBULATORY_CARE_PROVIDER_SITE_OTHER): Payer: Self-pay | Admitting: *Deleted

## 2017-02-22 ENCOUNTER — Telehealth: Payer: Self-pay | Admitting: *Deleted

## 2017-02-22 DIAGNOSIS — D649 Anemia, unspecified: Secondary | ICD-10-CM | POA: Diagnosis not present

## 2017-02-22 DIAGNOSIS — Q231 Congenital insufficiency of aortic valve: Secondary | ICD-10-CM

## 2017-02-22 DIAGNOSIS — T8781 Dehiscence of amputation stump: Secondary | ICD-10-CM | POA: Diagnosis not present

## 2017-02-22 DIAGNOSIS — I4891 Unspecified atrial fibrillation: Secondary | ICD-10-CM

## 2017-02-22 DIAGNOSIS — I2510 Atherosclerotic heart disease of native coronary artery without angina pectoris: Secondary | ICD-10-CM | POA: Diagnosis not present

## 2017-02-22 DIAGNOSIS — E1151 Type 2 diabetes mellitus with diabetic peripheral angiopathy without gangrene: Secondary | ICD-10-CM | POA: Diagnosis not present

## 2017-02-22 DIAGNOSIS — Z9889 Other specified postprocedural states: Secondary | ICD-10-CM

## 2017-02-22 DIAGNOSIS — Q2381 Bicuspid aortic valve: Secondary | ICD-10-CM

## 2017-02-22 DIAGNOSIS — I82603 Acute embolism and thrombosis of unspecified veins of upper extremity, bilateral: Secondary | ICD-10-CM

## 2017-02-22 DIAGNOSIS — T8189XD Other complications of procedures, not elsewhere classified, subsequent encounter: Secondary | ICD-10-CM | POA: Diagnosis not present

## 2017-02-22 DIAGNOSIS — I9789 Other postprocedural complications and disorders of the circulatory system, not elsewhere classified: Secondary | ICD-10-CM

## 2017-02-22 LAB — POCT INR: INR: 3.4

## 2017-02-22 NOTE — Telephone Encounter (Signed)
Done.  See coumadin note. 

## 2017-02-22 NOTE — Telephone Encounter (Signed)
INR 3.4 taking 2mg  daily per [facility] w/ [facility] [phone]

## 2017-02-25 ENCOUNTER — Ambulatory Visit (INDEPENDENT_AMBULATORY_CARE_PROVIDER_SITE_OTHER): Payer: Self-pay | Admitting: *Deleted

## 2017-02-25 DIAGNOSIS — Q2381 Bicuspid aortic valve: Secondary | ICD-10-CM

## 2017-02-25 DIAGNOSIS — D649 Anemia, unspecified: Secondary | ICD-10-CM | POA: Diagnosis not present

## 2017-02-25 DIAGNOSIS — T8781 Dehiscence of amputation stump: Secondary | ICD-10-CM | POA: Diagnosis not present

## 2017-02-25 DIAGNOSIS — I82603 Acute embolism and thrombosis of unspecified veins of upper extremity, bilateral: Secondary | ICD-10-CM

## 2017-02-25 DIAGNOSIS — T8189XD Other complications of procedures, not elsewhere classified, subsequent encounter: Secondary | ICD-10-CM | POA: Diagnosis not present

## 2017-02-25 DIAGNOSIS — I9789 Other postprocedural complications and disorders of the circulatory system, not elsewhere classified: Secondary | ICD-10-CM

## 2017-02-25 DIAGNOSIS — Q231 Congenital insufficiency of aortic valve: Secondary | ICD-10-CM

## 2017-02-25 DIAGNOSIS — E1151 Type 2 diabetes mellitus with diabetic peripheral angiopathy without gangrene: Secondary | ICD-10-CM | POA: Diagnosis not present

## 2017-02-25 DIAGNOSIS — I2510 Atherosclerotic heart disease of native coronary artery without angina pectoris: Secondary | ICD-10-CM | POA: Diagnosis not present

## 2017-02-25 DIAGNOSIS — I4891 Unspecified atrial fibrillation: Secondary | ICD-10-CM | POA: Diagnosis not present

## 2017-02-25 DIAGNOSIS — Z9889 Other specified postprocedural states: Secondary | ICD-10-CM

## 2017-02-25 LAB — POCT INR: INR: 2.3

## 2017-02-26 DIAGNOSIS — E1151 Type 2 diabetes mellitus with diabetic peripheral angiopathy without gangrene: Secondary | ICD-10-CM | POA: Diagnosis not present

## 2017-02-26 DIAGNOSIS — I2510 Atherosclerotic heart disease of native coronary artery without angina pectoris: Secondary | ICD-10-CM | POA: Diagnosis not present

## 2017-02-26 DIAGNOSIS — T8781 Dehiscence of amputation stump: Secondary | ICD-10-CM | POA: Diagnosis not present

## 2017-02-26 DIAGNOSIS — D649 Anemia, unspecified: Secondary | ICD-10-CM | POA: Diagnosis not present

## 2017-02-26 DIAGNOSIS — T8189XD Other complications of procedures, not elsewhere classified, subsequent encounter: Secondary | ICD-10-CM | POA: Diagnosis not present

## 2017-02-26 DIAGNOSIS — I4891 Unspecified atrial fibrillation: Secondary | ICD-10-CM | POA: Diagnosis not present

## 2017-03-01 ENCOUNTER — Encounter (INDEPENDENT_AMBULATORY_CARE_PROVIDER_SITE_OTHER): Payer: Self-pay | Admitting: Orthopedic Surgery

## 2017-03-01 ENCOUNTER — Ambulatory Visit (INDEPENDENT_AMBULATORY_CARE_PROVIDER_SITE_OTHER): Payer: Medicare Other | Admitting: Orthopedic Surgery

## 2017-03-01 VITALS — Ht 61.0 in | Wt 167.0 lb

## 2017-03-01 DIAGNOSIS — Z89511 Acquired absence of right leg below knee: Secondary | ICD-10-CM

## 2017-03-01 DIAGNOSIS — S68411D Complete traumatic amputation of right hand at wrist level, subsequent encounter: Secondary | ICD-10-CM | POA: Insufficient documentation

## 2017-03-01 DIAGNOSIS — Z89512 Acquired absence of left leg below knee: Secondary | ICD-10-CM

## 2017-03-01 DIAGNOSIS — S68412D Complete traumatic amputation of left hand at wrist level, subsequent encounter: Secondary | ICD-10-CM

## 2017-03-01 NOTE — Progress Notes (Signed)
Office Visit Note   Patient: Scott [name]           Date of Birth: 08-03-1946           MRN: [id number] Visit Date: 03/01/2017              Requested by: Manon Hilding, MD [place], [place] [address] PCP: Manon Hilding, MD  Chief Complaint  Patient presents with  . Left Leg - Follow-up    12/11/16 bilateral below the knee amputations   . Right Leg - Follow-up      HPI: Patient is status post complications from open heart surgery which eventually required bilateral hand amputations and bilateral transtibial amputations. Patient also has persistent draining wound from his scrotal incision and also has drainage from the tracheostomy site. There is also a open wound on the right thigh.  Assessment & Plan: Visit Diagnoses:  1. Status post bilateral below knee amputation ([place])   2. Amputation of both hands with complication, subsequent encounter     Plan: We'll have patient wear the compression stockings for both lower extremities he will wear these around-the-clock. Recommend that he follow-up with [facility] for hand and bilateral transtibial prosthetics. Patient will need a smaller shrinker for both legs he currently has a double XL and will need a single XL shrinker. Patient and his family were given instructions for wound care with daily soap and water cleansing wearing the compression shrinker as around-the-clock. They will call if they have any questions with his wound care.  Follow-Up Instructions: Return in about 4 weeks (around 03/29/2017).   Ortho Exam  Patient is alert, oriented, no adenopathy, well-dressed, normal affect, normal respiratory effort. Examination patient has some phlegm draining from his tracheostomy site. Patient will follow up with Dr. Alvan Dame for evaluation and treatment of the tracheostomy. Examination of both upper extremities the incisions are well healed he does have a little bit of neuroma pain in the left hand but anticipate this should resolve with  time. There is no open wounds in either hand. Examination of bilateral transtibial amputations he has eschar tissue over the residual limb bilaterally. This was removed there was good healthy bleeding granulation tissue and this was touched with silver nitrate. The compression stump shrinker as was applied.  Imaging: No results found.  Labs: Lab Results  Component Value Date   HGBA1C 6.6 (H) 10/21/2016   REPTSTATUS 02/06/2017 FINAL 02/01/2017   GRAMSTAIN  02/01/2017    ABUNDANT WBC PRESENT, PREDOMINANTLY PMN MODERATE GRAM NEGATIVE RODS    CULT  02/01/2017    FEW PSEUDOMONAS AERUGINOSA MODERATE BACTEROIDES FRAGILIS BETA LACTAMASE POSITIVE    LABORGA PSEUDOMONAS AERUGINOSA 02/01/2017    Orders:  No orders of the defined types were placed in this encounter.  No orders of the defined types were placed in this encounter.    Procedures: No procedures performed  Clinical Data: No additional findings.  ROS:  All other systems negative, except as noted in the HPI. Review of Systems  Objective: Vital Signs: Ht 5\' 1"  (1.549 m)   Wt 167 lb (75.8 kg)   BMI 31.55 kg/m   Specialty Comments:  No specialty comments available.  PMFS History: Patient Active Problem List   Diagnosis Date Noted  . Amputation of both hands with complication, subsequent encounter 03/01/2017  . Diabetes ([place]) 02/19/2017  . Status post bilateral below knee amputation ([place]) 02/09/2017  . Wound disruption, post-op, skin, sequela 02/09/2017  . Debility 02/05/2017  .  Ganglion upper arm, right   . Thrombosis of both upper extremities   . Suspected heparin induced thrombocytopenia (HIT) in hospitalized patient ([place])   . Gangrene of lower extremity ([place])   . Heparin induced thrombocytopenia (HIT) ([place])   . Tracheostomy status ([place])   . Chest tube in place   . Atherosclerosis of native arteries of extremities with gangrene, left leg ([place])   . Atherosclerosis of native arteries of extremities with  gangrene, right leg ([place])   . Acute on chronic diastolic CHF (congestive heart failure) ([place])   . Enteritis due to Clostridium difficile   . Anasarca   . FUO (fever of unknown origin)   . Acute encephalopathy   . Elevated LFTs   . DIC (disseminated intravascular coagulation) ([place]) 10/28/2016  . Postoperative atrial fibrillation ([place]) 10/24/2016  . Cardiogenic shock ([place])   . Mitral regurgitation due to cusp prolapse 10/22/2016  . S/P aortic valve replacement with bioprosthetic valve 10/22/2016  . S/P ascending aortic aneurysm repair 10/22/2016  . S/P mitral valve repair 10/22/2016  . Hx of CABG 10/22/2016  . Thrombocytopenia ([place]) 10/22/2016  . Acute combined systolic and diastolic congestive heart failure ([place]) 10/22/2016  . Acute respiratory failure ([place]) 10/22/2016  . Thoracic ascending aortic aneurysm ([place]) 10/21/2016  . Coronary artery disease involving native coronary artery of native heart with unstable angina pectoris ([place]) 10/20/2016  . Severe aortic stenosis by prior echocardiography 10/19/2016  . Unstable angina ([place]) 10/19/2016  . Aortic valve stenosis 10/19/2016  . Bicuspid aortic valve 10/19/2016  . Trigger ring finger of right hand   . Wears glasses   . Gout   . Hypertension   . Carpal tunnel syndrome of right wrist   . Arthritis   . Snores    Past Medical History:  Diagnosis Date  . Arthritis    "hips, shoulders; knees; back" (10/20/2016)  . Bicuspid aortic valve   . BPH (benign prostatic hypertrophy)   . Carpal tunnel syndrome of right wrist   . Coronary artery disease involving native coronary artery of native heart with unstable angina pectoris ([place]) 10/20/2016  . Diverticulitis   . GERD (gastroesophageal reflux disease)   . Gout   . Heart murmur   . Hyperlipemia   . Hypertension   . Postoperative atrial fibrillation ([place]) 10/24/2016  . RLS (restless legs syndrome)   . S/P aortic valve replacement with bioprosthetic valve 10/22/2016   25 mm [facility] Ease bovine pericardial bioprosthetic tissue valve  . S/P ascending aortic aneurysm repair 10/22/2016   28 mm supracoronary straight graft replacement of ascending thoracic aortic aneurysm  . S/P CABG x 3 10/22/2016   Sequential LIMA to Diag and LAD, SVG to distal LAD, open vein harvest right thigh  . S/P mitral valve repair 10/22/2016   Artificial Gore-tex neochord placement x6 - posterior annuloplasty band placed but removed due to systolic anterior motion of mitral valve  . Severe aortic stenosis   . Snores    Never been tested for sleep apnea  . Thoracic ascending aortic aneurysm ([place]) 10/21/2016  . Thrombocytopenia ([place]) 10/22/2016  . Type II diabetes mellitus (H[facility])     Family History  Problem Relation Age of Onset  . Lung cancer Mother   . Clotting disorder Father     No details  . Heart disease Sister 65    Stents  . Cancer Sister     Throat    Past Surgical History:  Procedure Laterality Date  . AMPUTATION Bilateral  12/11/2016   Procedure: AMPUTATION BELOW KNEE BILATERALLY;  Surgeon: [name], MD;  Location: [place];  Service: Orthopedics;  Laterality: Bilateral;  . AMPUTATION Bilateral 12/11/2016   Procedure: AMPUTATION BILATERAL HANDS EXCEPT RIGHT THUMB;  Surgeon: [name], MD;  Location: [place];  Service: Orthopedics;  Laterality: Bilateral;  . AORTIC VALVE REPLACEMENT N/A 10/22/2016   Procedure: AORTIC VALVE REPLACEMENT (AVR) WITH SIZE 25 MM MAGNA EASE PERICARDIAL BIOPROSTHESIS - AORTIC;  Surgeon: Rexene Alberts, MD;  Location: [facility];  Service: Open Heart Surgery;  Laterality: N/A;  . BONE EXCISION Right 02/01/2017   Procedure: EXCISION RIGHT INDEX METACARPAL HEAD;  Surgeon: [name], MD;  Location: [facility];  Service: Orthopedics;  Laterality: Right;  . CARDIAC CATHETERIZATION N/A 10/20/2016   Procedure: Right/Left Heart Cath and Coronary Angiography;  Surgeon: Leonie Man, MD;  Location: [place] [facility];  Service: Cardiovascular;  Laterality: N/A;    . CARPAL TUNNEL RELEASE Right 11/28/2013   Procedure: RIGHT WRIST CARPAL TUNNEL RELEASE;  Surgeon: Lorn Junes, MD;  Location: [facility];  Service: Orthopedics;  Laterality: Right;  . CATARACT EXTRACTION W/ INTRAOCULAR LENS  IMPLANT, BILATERAL Bilateral 1978  . COLONOSCOPY    . CORONARY ARTERY BYPASS GRAFT N/A 10/22/2016   Procedure: CORONARY ARTERY BYPASS GRAFTING (CABG)x 2 WITH LIMA TO DIAGONAL, OPEN  HARVESTING OF RIGHT SAPHENOUS VEIN FOR VEIN GRAFT TO LAD;  Surgeon: Rexene Alberts, MD;  Location: [facility];  Service: Open Heart Surgery;  Laterality: N/A;  . FRACTURE SURGERY    . INGUINAL HERNIA REPAIR Right 1998  . IR GENERIC HISTORICAL  12/07/2016   IR US GUIDE VASC ACCESS RIGHT 12/07/2016 Aletta Edouard, MD [facility]  . IR GENERIC HISTORICAL  12/07/2016   IR RADIOLOGY PERIPHERAL GUIDED IV START 12/07/2016 Aletta Edouard, MD [facility]  . IR GENERIC HISTORICAL  12/07/2016   IR GASTROSTOMY TUBE MOD SED 12/07/2016 Aletta Edouard, MD [facility]  . LIPOMA EXCISION Right 2008   "side of my head"  . MITRAL VALVE REPAIR N/A 10/22/2016   Procedure: MITRAL VALVE REPAIR (MVR) WITH SIZE 30 SORIN ANNULOFLEX ANNULOPLASTY RING WITH SUBSEQUENT REMOVAL OF RING;  Surgeon: Rexene Alberts, MD;  Location: [facility];  Service: Open Heart Surgery;  Laterality: N/A;  . PENECTOMY  2007   Peyronie's disease   . PENILE PROSTHESIS IMPLANT  2009  . REMOVAL OF PENILE PROSTHESIS N/A 02/01/2017   Procedure: REMOVAL OF PENILE PROSTHESIS;  Surgeon: Kathie Rhodes, MD;  Location: [place];  Service: Urology;  Laterality: N/A;  . SHOULDER OPEN ROTATOR CUFF REPAIR Right 2006  . STERNAL CLOSURE N/A 10/26/2016   Procedure: STERNAL WASHOUT AND DELAYED PRIMARY CLOSURE;  Surgeon: Rexene Alberts, MD;  Location: [place];  Service: Thoracic;  Laterality: N/A;  . TEE WITHOUT CARDIOVERSION N/A 10/26/2016   Procedure: TRANSESOPHAGEAL ECHOCARDIOGRAM (TEE);  Surgeon: Rexene Alberts, MD;  Location: [place];  Service:  Thoracic;  Laterality: N/A;  . TEE WITHOUT CARDIOVERSION N/A 10/22/2016   Procedure: TRANSESOPHAGEAL ECHOCARDIOGRAM (TEE);  Surgeon: Rexene Alberts, MD;  Location: [place];  Service: Open Heart Surgery;  Laterality: N/A;  . THORACIC AORTIC ANEURYSM REPAIR  10/22/2016   Procedure: ASCENDING AORTIC  ANEURYSM REPAIR (AAA) WITH 28 MM HEMASHIELD PLATINUM WOVEN DOUBLE VELOUR VASCULAR GRAFT;  Surgeon: Rexene Alberts, MD;  Location: [facility];  Service: Open Heart Surgery;;  . TONSILLECTOMY  ~ 1955  . TRACHEOSTOMY TUBE PLACEMENT N/A 11/09/2016   Procedure: TRACHEOSTOMY;  Surgeon: Rexene Alberts, MD;  Location: [facility];  Service: Thoracic;  Laterality: N/A;  . TRANSURETHRAL RESECTION OF PROSTATE  2005  . TRIGGER FINGER RELEASE Bilateral    several lt and rt hands  . TRIGGER FINGER RELEASE Right 11/28/2013   Procedure: RIGHT TRIGGER FINGER  RELEASE (TENDON SHEATH INCISION);  Surgeon: Lorn Junes, MD;  Location: [place] [facility];  Service: Orthopedics;  Laterality: Right;  Marland Kitchen VIDEO BRONCHOSCOPY N/A 11/09/2016   Procedure: VIDEO BRONCHOSCOPY;  Surgeon: Rexene Alberts, MD;  Location: [place];  Service: Thoracic;  Laterality: N/A;  . WRIST FRACTURE SURGERY Right ~ 1959   Social History   Occupational History  . [employer]    retired   Social History Main Topics  . Smoking status: Former Smoker    Years: 3.00    Types: Pipe, Cigars    Quit date: 1984  . Smokeless tobacco: Never Used  . Alcohol use 6.0 oz/week    10 Cans of beer per week  . Drug use: No  . Sexual activity: Not Currently

## 2017-03-02 DIAGNOSIS — I4891 Unspecified atrial fibrillation: Secondary | ICD-10-CM | POA: Diagnosis not present

## 2017-03-02 DIAGNOSIS — D649 Anemia, unspecified: Secondary | ICD-10-CM | POA: Diagnosis not present

## 2017-03-02 DIAGNOSIS — I2510 Atherosclerotic heart disease of native coronary artery without angina pectoris: Secondary | ICD-10-CM | POA: Diagnosis not present

## 2017-03-02 DIAGNOSIS — T8781 Dehiscence of amputation stump: Secondary | ICD-10-CM | POA: Diagnosis not present

## 2017-03-02 DIAGNOSIS — T8189XD Other complications of procedures, not elsewhere classified, subsequent encounter: Secondary | ICD-10-CM | POA: Diagnosis not present

## 2017-03-02 DIAGNOSIS — E1151 Type 2 diabetes mellitus with diabetic peripheral angiopathy without gangrene: Secondary | ICD-10-CM | POA: Diagnosis not present

## 2017-03-03 DIAGNOSIS — I4891 Unspecified atrial fibrillation: Secondary | ICD-10-CM | POA: Diagnosis not present

## 2017-03-03 DIAGNOSIS — I2510 Atherosclerotic heart disease of native coronary artery without angina pectoris: Secondary | ICD-10-CM | POA: Diagnosis not present

## 2017-03-03 DIAGNOSIS — T8781 Dehiscence of amputation stump: Secondary | ICD-10-CM | POA: Diagnosis not present

## 2017-03-03 DIAGNOSIS — E1151 Type 2 diabetes mellitus with diabetic peripheral angiopathy without gangrene: Secondary | ICD-10-CM | POA: Diagnosis not present

## 2017-03-03 DIAGNOSIS — T8189XD Other complications of procedures, not elsewhere classified, subsequent encounter: Secondary | ICD-10-CM | POA: Diagnosis not present

## 2017-03-03 DIAGNOSIS — D649 Anemia, unspecified: Secondary | ICD-10-CM | POA: Diagnosis not present

## 2017-03-04 ENCOUNTER — Telehealth: Payer: Self-pay | Admitting: Cardiology

## 2017-03-04 ENCOUNTER — Ambulatory Visit (INDEPENDENT_AMBULATORY_CARE_PROVIDER_SITE_OTHER): Payer: Medicare Other | Admitting: *Deleted

## 2017-03-04 DIAGNOSIS — T8189XD Other complications of procedures, not elsewhere classified, subsequent encounter: Secondary | ICD-10-CM | POA: Diagnosis not present

## 2017-03-04 DIAGNOSIS — D649 Anemia, unspecified: Secondary | ICD-10-CM | POA: Diagnosis not present

## 2017-03-04 DIAGNOSIS — Q231 Congenital insufficiency of aortic valve: Secondary | ICD-10-CM

## 2017-03-04 DIAGNOSIS — E1151 Type 2 diabetes mellitus with diabetic peripheral angiopathy without gangrene: Secondary | ICD-10-CM | POA: Diagnosis not present

## 2017-03-04 DIAGNOSIS — Z9889 Other specified postprocedural states: Secondary | ICD-10-CM

## 2017-03-04 DIAGNOSIS — I4891 Unspecified atrial fibrillation: Secondary | ICD-10-CM

## 2017-03-04 DIAGNOSIS — I82603 Acute embolism and thrombosis of unspecified veins of upper extremity, bilateral: Secondary | ICD-10-CM

## 2017-03-04 DIAGNOSIS — T8781 Dehiscence of amputation stump: Secondary | ICD-10-CM | POA: Diagnosis not present

## 2017-03-04 DIAGNOSIS — T83490D Other mechanical complication of penile (implanted) prosthesis, subsequent encounter: Secondary | ICD-10-CM | POA: Diagnosis not present

## 2017-03-04 DIAGNOSIS — I9789 Other postprocedural complications and disorders of the circulatory system, not elsewhere classified: Secondary | ICD-10-CM

## 2017-03-04 DIAGNOSIS — I2510 Atherosclerotic heart disease of native coronary artery without angina pectoris: Secondary | ICD-10-CM | POA: Diagnosis not present

## 2017-03-04 DIAGNOSIS — Q2381 Bicuspid aortic valve: Secondary | ICD-10-CM

## 2017-03-04 LAB — POCT INR: INR: 1.7

## 2017-03-04 NOTE — Telephone Encounter (Signed)
Done.  See coumadin note. 

## 2017-03-04 NOTE — Telephone Encounter (Signed)
If Home Health RN is calling please get Coumadin Nurse on the phone STAT  1.  Are you calling in regards to an appointment?no  2.  Are you calling for a refill ? no  3.  Are you having bleeding issues? no  4.  Do you need clearance to hold Coumadin? No  PtInr 1.7

## 2017-03-09 DIAGNOSIS — T8189XD Other complications of procedures, not elsewhere classified, subsequent encounter: Secondary | ICD-10-CM | POA: Diagnosis not present

## 2017-03-09 DIAGNOSIS — E1151 Type 2 diabetes mellitus with diabetic peripheral angiopathy without gangrene: Secondary | ICD-10-CM | POA: Diagnosis not present

## 2017-03-09 DIAGNOSIS — T8781 Dehiscence of amputation stump: Secondary | ICD-10-CM | POA: Diagnosis not present

## 2017-03-09 DIAGNOSIS — D649 Anemia, unspecified: Secondary | ICD-10-CM | POA: Diagnosis not present

## 2017-03-09 DIAGNOSIS — I4891 Unspecified atrial fibrillation: Secondary | ICD-10-CM | POA: Diagnosis not present

## 2017-03-09 DIAGNOSIS — I2510 Atherosclerotic heart disease of native coronary artery without angina pectoris: Secondary | ICD-10-CM | POA: Diagnosis not present

## 2017-03-11 ENCOUNTER — Other Ambulatory Visit (HOSPITAL_COMMUNITY)
Admission: RE | Admit: 2017-03-11 | Discharge: 2017-03-11 | Disposition: A | Discharge status: Home / Self Care | Payer: Medicare Other | Source: Ambulatory Visit | Attending: Cardiology | Admitting: Cardiology

## 2017-03-11 ENCOUNTER — Ambulatory Visit (INDEPENDENT_AMBULATORY_CARE_PROVIDER_SITE_OTHER): Payer: Self-pay | Admitting: Cardiovascular Disease

## 2017-03-11 ENCOUNTER — Telehealth: Payer: Self-pay | Admitting: Cardiology

## 2017-03-11 ENCOUNTER — Ambulatory Visit (INDEPENDENT_AMBULATORY_CARE_PROVIDER_SITE_OTHER): Payer: Medicare Other | Admitting: Cardiology

## 2017-03-11 ENCOUNTER — Encounter: Payer: Self-pay | Admitting: Cardiology

## 2017-03-11 ENCOUNTER — Other Ambulatory Visit: Payer: Self-pay | Admitting: Cardiology

## 2017-03-11 VITALS — BP 114/68 | HR 62 | Ht 61.0 in | Wt 161.0 lb

## 2017-03-11 DIAGNOSIS — I513 Intracardiac thrombosis, not elsewhere classified: Secondary | ICD-10-CM

## 2017-03-11 DIAGNOSIS — Z952 Presence of prosthetic heart valve: Secondary | ICD-10-CM | POA: Diagnosis not present

## 2017-03-11 DIAGNOSIS — Q231 Congenital insufficiency of aortic valve: Secondary | ICD-10-CM

## 2017-03-11 DIAGNOSIS — I2510 Atherosclerotic heart disease of native coronary artery without angina pectoris: Secondary | ICD-10-CM

## 2017-03-11 DIAGNOSIS — I4891 Unspecified atrial fibrillation: Secondary | ICD-10-CM

## 2017-03-11 DIAGNOSIS — T8189XD Other complications of procedures, not elsewhere classified, subsequent encounter: Secondary | ICD-10-CM | POA: Diagnosis not present

## 2017-03-11 DIAGNOSIS — I219 Acute myocardial infarction, unspecified: Secondary | ICD-10-CM

## 2017-03-11 DIAGNOSIS — Z9889 Other specified postprocedural states: Secondary | ICD-10-CM

## 2017-03-11 DIAGNOSIS — E1151 Type 2 diabetes mellitus with diabetic peripheral angiopathy without gangrene: Secondary | ICD-10-CM | POA: Diagnosis not present

## 2017-03-11 DIAGNOSIS — I482 Chronic atrial fibrillation: Secondary | ICD-10-CM | POA: Insufficient documentation

## 2017-03-11 DIAGNOSIS — I9789 Other postprocedural complications and disorders of the circulatory system, not elsewhere classified: Secondary | ICD-10-CM

## 2017-03-11 DIAGNOSIS — D649 Anemia, unspecified: Secondary | ICD-10-CM | POA: Diagnosis not present

## 2017-03-11 DIAGNOSIS — I82603 Acute embolism and thrombosis of unspecified veins of upper extremity, bilateral: Secondary | ICD-10-CM

## 2017-03-11 DIAGNOSIS — T8781 Dehiscence of amputation stump: Secondary | ICD-10-CM | POA: Diagnosis not present

## 2017-03-11 LAB — CBC
HCT: 36.6 % — ABNORMAL LOW (ref 39.0–52.0)
Hemoglobin: 11.5 g/dL — ABNORMAL LOW (ref 13.0–17.0)
MCH: 26.6 pg (ref 26.0–34.0)
MCHC: 31.4 g/dL (ref 30.0–36.0)
MCV: 84.7 fL (ref 78.0–100.0)
PLATELETS: 174 10*3/uL (ref 150–400)
RBC: 4.32 MIL/uL (ref 4.22–5.81)
RDW: 18.1 % — ABNORMAL HIGH (ref 11.5–15.5)
WBC: 6.7 10*3/uL (ref 4.0–10.5)

## 2017-03-11 LAB — COMPREHENSIVE METABOLIC PANEL
ALBUMIN: 3.2 g/dL — AB (ref 3.5–5.0)
ALK PHOS: 101 U/L (ref 38–126)
ALT: 25 U/L (ref 17–63)
ANION GAP: 8 (ref 5–15)
AST: 31 U/L (ref 15–41)
BILIRUBIN TOTAL: 0.4 mg/dL (ref 0.3–1.2)
BUN: 17 mg/dL (ref 6–20)
CALCIUM: 9.1 mg/dL (ref 8.9–10.3)
CO2: 27 mmol/L (ref 22–32)
Chloride: 104 mmol/L (ref 101–111)
Creatinine, Ser: 0.77 mg/dL (ref 0.61–1.24)
GLUCOSE: 110 mg/dL — AB (ref 65–99)
Potassium: 3.4 mmol/L — ABNORMAL LOW (ref 3.5–5.1)
Sodium: 139 mmol/L (ref 135–145)
TOTAL PROTEIN: 6.7 g/dL (ref 6.5–8.1)

## 2017-03-11 LAB — TSH: TSH: 3.25 u[IU]/mL (ref 0.350–4.500)

## 2017-03-11 LAB — POCT INR: INR: 1.6

## 2017-03-11 MED ORDER — DIGOXIN 125 MCG PO TABS: 0.1250 mg | ORAL_TABLET | Freq: Every day | ORAL | 3 refills | Status: DC

## 2017-03-11 MED ORDER — ATENOLOL 25 MG PO TABS: 25.0000 mg | ORAL_TABLET | Freq: Every day | ORAL | 3 refills | Status: DC

## 2017-03-11 MED ORDER — AMIODARONE HCL 100 MG PO TABS: 100.0000 mg | ORAL_TABLET | Freq: Every day | ORAL | 3 refills | Status: DC

## 2017-03-11 MED ORDER — FUROSEMIDE 40 MG PO TABS: 40.0000 mg | ORAL_TABLET | Freq: Two times a day (BID) | ORAL | 3 refills | Status: DC

## 2017-03-11 MED ORDER — ATORVASTATIN CALCIUM 40 MG PO TABS: 40.0000 mg | ORAL_TABLET | Freq: Every day | ORAL | 3 refills | Status: DC

## 2017-03-11 MED ORDER — POTASSIUM CHLORIDE CRYS ER 20 MEQ PO TBCR: 20.0000 meq | EXTENDED_RELEASE_TABLET | Freq: Two times a day (BID) | ORAL | 3 refills | Status: DC

## 2017-03-11 MED ORDER — LISINOPRIL 2.5 MG PO TABS: 2.5000 mg | ORAL_TABLET | Freq: Every day | ORAL | 3 refills | Status: DC

## 2017-03-11 MED ORDER — WARFARIN SODIUM 2 MG PO TABS: 2.0000 mg | ORAL_TABLET | Freq: Every day | ORAL | 3 refills | Status: DC

## 2017-03-11 NOTE — Telephone Encounter (Signed)
See coumadin encounter of this date 

## 2017-03-11 NOTE — Progress Notes (Signed)
Clinical Summary Mr. Gallon is a 71 y.o.male seen today for follow up of the following medical problems. He has bee followed by our group but this is our first visit together.    1. Aortic stenosis - history of bicuspid AV - history of prior AVR  - OR on 10/22/16 for CABG X 3, AVR, MVR, repair of thoracic ascending aneurysm with placement of VAC on open chest Post op course significant for cardiogenic shock requiring multiple pressors, SAM requiring removal of mitral annuloplasty ring, persistent fevers, encephalopathy, shocked liver, DIC due to consumptive intraoperative coagulopathy, diffuse clotting with ischemia of distal BUE/BLE extremities, A fib, C diff colitis, volume overload and VDRF with difficulty with vent wean requiring tracheostomy. Sternal wound closed on 12/4 and was started on coumadin for RV thrombus and A fib.   [facility] course significant for MRSA bacteremia, pneumonitis, intermittent fevers, dysphagia requiring PEG tube placement on 1/15 by Dr. Kathlene Cote. He developed progressive gangrenous changes of BUE and BLE requiring B-transtibial amputation , left transmetacarpal amputation and Right MCP amputation of right hand by Dr. Sharol Given on 1/19.   - since discharge from cardiac standpoint he had not had any issues. No SOB/DOE, no recent LE edema - compliant with meds.     2. Mitral regurgitation - patient had MV ring placed during his surgery 10/22/17 along with AVR, ascending thoracic aneurysm repair, and CABG.  - ring was subsequently removed during that operation due to significant SAM - 02/15/17 echo shows only mild MR  3. CAD - history of CABG 10/22/2017 (LIMA-Diag and distal LAD, SVG-LAD) 01/2017 echo: 55-60%, no WMAs, grade II diastolic dysfunction. Normal functioning AV.  - no recent chest pain or SOB  4. Ischemic limbs - occurred in setting of cardiogenic shock requiring high dose pressors as well as DIC  - required amputation - followed by  ortho  5.Post op Afib - has been on amio - denies any palpitations - no recent bleeding on coumadin.   6. RA thrombus - noted during duruing 10/26/16 TEE - has been on coumadin  7. Scrotal surgery - followed by urologist.   Past Medical History:  Diagnosis Date  . Arthritis    "hips, shoulders; knees; back" (10/20/2016)  . Bicuspid aortic valve   . BPH (benign prostatic hypertrophy)   . Carpal tunnel syndrome of right wrist   . Coronary artery disease involving native coronary artery of native heart with unstable angina pectoris ([place]) 10/20/2016  . Diverticulitis   . GERD (gastroesophageal reflux disease)   . Gout   . Heart murmur   . Hyperlipemia   . Hypertension   . Postoperative atrial fibrillation ([place]) 10/24/2016  . RLS (restless legs syndrome)   . S/P aortic valve replacement with bioprosthetic valve 10/22/2016   25 mm [facility] Ease bovine pericardial bioprosthetic tissue valve  . S/P ascending aortic aneurysm repair 10/22/2016   28 mm supracoronary straight graft replacement of ascending thoracic aortic aneurysm  . S/P CABG x 3 10/22/2016   Sequential LIMA to Diag and LAD, SVG to distal LAD, open vein harvest right thigh  . S/P mitral valve repair 10/22/2016   Artificial Gore-tex neochord placement x6 - posterior annuloplasty band placed but removed due to systolic anterior motion of mitral valve  . Severe aortic stenosis   . Snores    Never been tested for sleep apnea  . Thoracic ascending aortic aneurysm ([place]) 10/21/2016  . Thrombocytopenia ([place]) 10/22/2016  . Type II diabetes mellitus ([place])  Allergies  Allergen Reactions  . Doxycycline Hives  . Penicillins Hives     Has patient had a PCN reaction causing immediate rash, facial/tongue/throat swelling, SOB or lightheadedness with hypotension: No Has patient had a PCN reaction causing severe rash involving mucus membranes or skin necrosis: No Has patient had a PCN reaction that required  hospitalization: No Has patient had a PCN reaction occurring within the last 10 years:# # # YES # # #  If all of the above answers are "NO", then may proceed with Cephalosporin use.   . Sulfa Antibiotics Hives     Current Outpatient Prescriptions  Medication Sig Dispense Refill  . amiodarone (PACERONE) 100 MG tablet Take 1 tablet (100 mg total) by mouth daily. 30 tablet 0  . aspirin 81 MG tablet Take 81 mg by mouth 2 (two) times daily.     Marland Kitchen atenolol (TENORMIN) 25 MG tablet Take 1 tablet (25 mg total) by mouth daily. 30 tablet 0  . atorvastatin (LIPITOR) 40 MG tablet Take 1 tablet (40 mg total) by mouth daily at 6 PM. 30 tablet 0  . blood glucose meter kit and supplies KIT Dispense based on patient and insurance preference. Use up to four times daily as directed. (FOR ICD-9 250.00, 250.01). 1 each 0  . collagenase (SANTYL) ointment Apply topically daily. 90 g 1  . digoxin (LANOXIN) 0.125 MG tablet Take 1 tablet (0.125 mg total) by mouth daily. 30 tablet 0  . famotidine (PEPCID) 20 MG tablet Take 1 tablet (20 mg total) by mouth 2 (two) times daily. 60 tablet 0  . ferrous sulfate 325 (65 FE) MG tablet Take 1 tablet (325 mg total) by mouth 2 (two) times daily with a meal. 60 tablet 0  . fluticasone (FLONASE) 50 MCG/ACT nasal spray Place 1 spray into both nostrils daily. 9.9 g 2  . furosemide (LASIX) 40 MG tablet Take 1 tablet (40 mg total) by mouth 2 (two) times daily. 60 tablet 0  . gabapentin (NEURONTIN) 300 MG capsule Take 1 capsule (300 mg total) by mouth 2 (two) times daily. 60 capsule 0  . hydrocerin (EUCERIN) CREA Apply 1 application topically 2 (two) times daily. 228 g 0  . Insulin Glargine (LANTUS) 100 UNIT/ML Solostar Pen Inject 5 Units into the skin 2 (two) times daily. 15 mL 0  . Insulin Pen Needle (CAREFINE PEN NEEDLES) 32G X 5 MM MISC 1 application by Does not apply route 2 (two) times daily. 100 each 0  . lisinopril (PRINIVIL,ZESTRIL) 2.5 MG tablet Take 1 tablet (2.5 mg total) by  mouth daily. 30 tablet 0  . loratadine (CLARITIN) 10 MG tablet Take 1 tablet (10 mg total) by mouth daily. 30 tablet 0  . Melatonin 10 MG CAPS Take 10 mg by mouth at bedtime.     . nitroGLYCERIN (NITROSTAT) 0.4 MG SL tablet Place 0.4 mg under the tongue every 5 (five) minutes as needed.     . potassium chloride SA (K-DUR,KLOR-CON) 20 MEQ tablet Take 1 tablet (20 mEq total) by mouth 2 (two) times daily. 60 tablet 0  . rOPINIRole (REQUIP) 2 MG tablet Take 2 mg by mouth See admin instructions. Takes 77m at 4:30pm and 216mat 9:30pm.    . sertraline (ZOLOFT) 50 MG tablet Take 1.5 tablets (75 mg total) by mouth daily. 45 tablet 0  . Turmeric 500 MG CAPS Take 500 mg by mouth 2 (two) times daily.     . vitamin C (VITAMIN C) 500 MG tablet Take  1 tablet (500 mg total) by mouth 2 (two) times daily. 60 tablet 0  . warfarin (COUMADIN) 2 MG tablet Take 1 tablet (2 mg total) by mouth daily at 6 PM. 30 tablet 0  . zinc sulfate 220 (50 Zn) MG capsule Take 1 capsule (220 mg total) by mouth 2 (two) times daily. 60 capsule 0  . zolpidem (AMBIEN) 5 MG tablet Take 1 tablet (5 mg total) by mouth at bedtime. 30 tablet 0   No current facility-administered medications for this visit.      Past Surgical History:  Procedure Laterality Date  . AMPUTATION Bilateral 12/11/2016   Procedure: AMPUTATION BELOW KNEE BILATERALLY;  Surgeon: [name], MD;  Location: [place];  Service: Orthopedics;  Laterality: Bilateral;  . AMPUTATION Bilateral 12/11/2016   Procedure: AMPUTATION BILATERAL HANDS EXCEPT RIGHT THUMB;  Surgeon: [name], MD;  Location: [place];  Service: Orthopedics;  Laterality: Bilateral;  . AORTIC VALVE REPLACEMENT N/A 10/22/2016   Procedure: AORTIC VALVE REPLACEMENT (AVR) WITH SIZE 25 MM MAGNA EASE PERICARDIAL BIOPROSTHESIS - AORTIC;  Surgeon: Rexene Alberts, MD;  Location: [place];  Service: Open Heart Surgery;  Laterality: N/A;  . BONE EXCISION Right 02/01/2017   Procedure: EXCISION RIGHT INDEX METACARPAL  HEAD;  Surgeon: [name], MD;  Location: [place];  Service: Orthopedics;  Laterality: Right;  . CARDIAC CATHETERIZATION N/A 10/20/2016   Procedure: Right/Left Heart Cath and Coronary Angiography;  Surgeon: Leonie Man, MD;  Location: [place] [facility];  Service: Cardiovascular;  Laterality: N/A;  . CARPAL TUNNEL RELEASE Right 11/28/2013   Procedure: RIGHT WRIST CARPAL TUNNEL RELEASE;  Surgeon: Lorn Junes, MD;  Location: [place];  Service: Orthopedics;  Laterality: Right;  . CATARACT EXTRACTION W/ INTRAOCULAR LENS  IMPLANT, BILATERAL Bilateral 1978  . COLONOSCOPY    . CORONARY ARTERY BYPASS GRAFT N/A 10/22/2016   Procedure: CORONARY ARTERY BYPASS GRAFTING (CABG)x 2 WITH LIMA TO DIAGONAL, OPEN  HARVESTING OF RIGHT SAPHENOUS VEIN FOR VEIN GRAFT TO LAD;  Surgeon: Rexene Alberts, MD;  Location: [place];  Service: Open Heart Surgery;  Laterality: N/A;  . FRACTURE SURGERY    . INGUINAL HERNIA REPAIR Right 1998  . IR GENERIC HISTORICAL  12/07/2016   IR US GUIDE VASC ACCESS RIGHT 12/07/2016 Aletta Edouard, MD [facility]  . IR GENERIC HISTORICAL  12/07/2016   IR RADIOLOGY PERIPHERAL GUIDED IV START 12/07/2016 Aletta Edouard, MD [facility]  . IR GENERIC HISTORICAL  12/07/2016   IR GASTROSTOMY TUBE MOD SED 12/07/2016 Aletta Edouard, MD [facility]  . LIPOMA EXCISION Right 2008   "side of my head"  . MITRAL VALVE REPAIR N/A 10/22/2016   Procedure: MITRAL VALVE REPAIR (MVR) WITH SIZE 30 SORIN ANNULOFLEX ANNULOPLASTY RING WITH SUBSEQUENT REMOVAL OF RING;  Surgeon: Rexene Alberts, MD;  Location: [place];  Service: Open Heart Surgery;  Laterality: N/A;  . PENECTOMY  2007   Peyronie's disease   . PENILE PROSTHESIS IMPLANT  2009  . REMOVAL OF PENILE PROSTHESIS N/A 02/01/2017   Procedure: REMOVAL OF PENILE PROSTHESIS;  Surgeon: Kathie Rhodes, MD;  Location: [place];  Service: Urology;  Laterality: N/A;  . SHOULDER OPEN ROTATOR CUFF REPAIR Right 2006  . STERNAL CLOSURE N/A 10/26/2016    Procedure: STERNAL WASHOUT AND DELAYED PRIMARY CLOSURE;  Surgeon: Rexene Alberts, MD;  Location: [place];  Service: Thoracic;  Laterality: N/A;  . TEE WITHOUT CARDIOVERSION N/A 10/26/2016   Procedure: TRANSESOPHAGEAL ECHOCARDIOGRAM (TEE);  Surgeon:  Rexene Alberts, MD;  Location: [place];  Service: Thoracic;  Laterality: N/A;  . TEE WITHOUT CARDIOVERSION N/A 10/22/2016   Procedure: TRANSESOPHAGEAL ECHOCARDIOGRAM (TEE);  Surgeon: Rexene Alberts, MD;  Location: [place];  Service: Open Heart Surgery;  Laterality: N/A;  . THORACIC AORTIC ANEURYSM REPAIR  10/22/2016   Procedure: ASCENDING AORTIC  ANEURYSM REPAIR (AAA) WITH 28 MM HEMASHIELD PLATINUM WOVEN DOUBLE VELOUR VASCULAR GRAFT;  Surgeon: Rexene Alberts, MD;  Location: [place];  Service: Open Heart Surgery;;  . TONSILLECTOMY  ~ 1955  . TRACHEOSTOMY TUBE PLACEMENT N/A 11/09/2016   Procedure: TRACHEOSTOMY;  Surgeon: Rexene Alberts, MD;  Location: [place];  Service: Thoracic;  Laterality: N/A;  . TRANSURETHRAL RESECTION OF PROSTATE  2005  . TRIGGER FINGER RELEASE Bilateral    several lt and rt hands  . TRIGGER FINGER RELEASE Right 11/28/2013   Procedure: RIGHT TRIGGER FINGER  RELEASE (TENDON SHEATH INCISION);  Surgeon: Lorn Junes, MD;  Location: [place];  Service: Orthopedics;  Laterality: Right;  Marland Kitchen VIDEO BRONCHOSCOPY N/A 11/09/2016   Procedure: VIDEO BRONCHOSCOPY;  Surgeon: Rexene Alberts, MD;  Location: [place];  Service: Thoracic;  Laterality: N/A;  . WRIST FRACTURE SURGERY Right ~ 5621     Allergies  Allergen Reactions  . Doxycycline Hives  . Penicillins Hives     Has patient had a PCN reaction causing immediate rash, facial/tongue/throat swelling, SOB or lightheadedness with hypotension: No Has patient had a PCN reaction causing severe rash involving mucus membranes or skin necrosis: No Has patient had a PCN reaction that required hospitalization: No Has patient had a PCN reaction occurring within the last 10 years:# # #  YES # # #  If all of the above answers are "NO", then may proceed with Cephalosporin use.   . Sulfa Antibiotics Hives      Family History  Problem Relation Age of Onset  . Lung cancer Mother   . Clotting disorder Father     No details  . Heart disease Sister 16    Stents  . Cancer Sister     Throat     Social History Mr. Wilsey reports that he quit smoking about 34 years ago. His smoking use included Pipe and Cigars. He quit after 3.00 years of use. He has never used smokeless tobacco. Mr. Brissett reports that he drinks about 6.0 oz of alcohol per week .   Review of Systems CONSTITUTIONAL: No weight loss, fever, chills, weakness or fatigue.  HEENT: Eyes: No visual loss, blurred vision, double vision or yellow sclerae.No hearing loss, sneezing, congestion, runny nose or sore throat.  SKIN: No rash or itching.  CARDIOVASCULAR: per hpi RESPIRATORY: No shortness of breath, cough or sputum.  GASTROINTESTINAL: No anorexia, nausea, vomiting or diarrhea. No abdominal pain or blood.  GENITOURINARY: No burning on urination, no polyuria NEUROLOGICAL: No headache, dizziness, syncope, paralysis, ataxia, numbness or tingling in the extremities. No change in bowel or bladder control.  MUSCULOSKELETAL: No muscle, back pain, joint pain or stiffness.  LYMPHATICS: No enlarged nodes. No history of splenectomy.  PSYCHIATRIC: No history of depression or anxiety.  ENDOCRINOLOGIC: No reports of sweating, cold or heat intolerance. No polyuria or polydipsia.  Marland Kitchen   Physical Examination Vitals:   03/11/17 1254  BP: 114/68  Pulse: 62   Vitals:   03/11/17 1254  Weight: 161 lb (73 kg)  Height: 5' 1" (1.549 m)    Gen: resting comfortably, no acute distress HEENT: no scleral  icterus, pupils equal round and reactive, no palptable cervical adenopathy,  CV: RRR, 2/6 systolic murmur at apex, no jvd Resp: Clear to auscultation bilaterally GI: abdomen is soft, non-tender, non-distended, normal bowel  sounds, no hepatosplenomegaly MSK: extremities are warm, no edema.  Skin: warm, no rash Neuro:  no focal deficits Psych: appropriate affect   Diagnostic Studies     Assessment and Plan  1. Aortic stenosis - s/p tissue AVR, recent echo shows normal function. No recent symptoms. Continue to monitor  2. Mitral regurgitation - MV ring placed and removed during initial surgery due to [facility] - most recent ehco shows mild MR. Likely improved due to decreased pressure across AV valve - continue to monitor at this time, no current symptoms  3. CAD - s/p CABG. No recent symptoms - continue current meds  4. Postoperative afib - no recent palpitations. EKG in clinic shows SR - contintue low dose amio at this time, likely consider stopping at f/u if remains in SR - will need to decide no his coumadin as well. He had a tissue AV placed, postop afib, RA thrombus. Unclear if would be any indication for continued coumadin from ortho/amputation standpoint. Would need to clear with all providers prior to stopping in the future  5. RA thrombus - occurred post cardiac surgery. Has been on coumadin - continue coumadin at this time. Most recent echo without evidence of ongoing clot - may consider stopping coumadin in near future  6. Ischemic limbs - s/p multiple amputations, occurred postop in setting of DIC and pressor use - continue to follow with ortho        Arnoldo Lenis, M.D.

## 2017-03-11 NOTE — Telephone Encounter (Signed)
Inr  1.6

## 2017-03-11 NOTE — Patient Instructions (Signed)
Your physician recommends that you schedule a follow-up appointment in: 1 month Dr Barron Alvine today: cbc,cmet,tsh    You have been referred to Dr Ricard Dillon, they will call you with apt      Thank you for choosing [facility] !

## 2017-03-11 NOTE — Telephone Encounter (Signed)
Dr Harl Bowie verified pt to stay on all cardiac meds listed.Wife aware refills e-scribed

## 2017-03-11 NOTE — Telephone Encounter (Signed)
Pt is needing refills on all his heart medications, but they're unsure of what meds Dr. Harl Bowie wants him to be on.

## 2017-03-15 DIAGNOSIS — E1151 Type 2 diabetes mellitus with diabetic peripheral angiopathy without gangrene: Secondary | ICD-10-CM | POA: Diagnosis not present

## 2017-03-15 DIAGNOSIS — T8781 Dehiscence of amputation stump: Secondary | ICD-10-CM | POA: Diagnosis not present

## 2017-03-15 DIAGNOSIS — D649 Anemia, unspecified: Secondary | ICD-10-CM | POA: Diagnosis not present

## 2017-03-15 DIAGNOSIS — I4891 Unspecified atrial fibrillation: Secondary | ICD-10-CM | POA: Diagnosis not present

## 2017-03-15 DIAGNOSIS — T8189XD Other complications of procedures, not elsewhere classified, subsequent encounter: Secondary | ICD-10-CM | POA: Diagnosis not present

## 2017-03-15 DIAGNOSIS — I2510 Atherosclerotic heart disease of native coronary artery without angina pectoris: Secondary | ICD-10-CM | POA: Diagnosis not present

## 2017-03-16 DIAGNOSIS — T8781 Dehiscence of amputation stump: Secondary | ICD-10-CM | POA: Diagnosis not present

## 2017-03-16 DIAGNOSIS — T8189XD Other complications of procedures, not elsewhere classified, subsequent encounter: Secondary | ICD-10-CM | POA: Diagnosis not present

## 2017-03-16 DIAGNOSIS — E1151 Type 2 diabetes mellitus with diabetic peripheral angiopathy without gangrene: Secondary | ICD-10-CM | POA: Diagnosis not present

## 2017-03-16 DIAGNOSIS — I4891 Unspecified atrial fibrillation: Secondary | ICD-10-CM | POA: Diagnosis not present

## 2017-03-16 DIAGNOSIS — D649 Anemia, unspecified: Secondary | ICD-10-CM | POA: Diagnosis not present

## 2017-03-16 DIAGNOSIS — I2510 Atherosclerotic heart disease of native coronary artery without angina pectoris: Secondary | ICD-10-CM | POA: Diagnosis not present

## 2017-03-17 ENCOUNTER — Other Ambulatory Visit: Payer: Self-pay | Admitting: Cardiology

## 2017-03-18 ENCOUNTER — Telehealth: Payer: Self-pay | Admitting: *Deleted

## 2017-03-18 ENCOUNTER — Ambulatory Visit (INDEPENDENT_AMBULATORY_CARE_PROVIDER_SITE_OTHER): Payer: Self-pay | Admitting: *Deleted

## 2017-03-18 DIAGNOSIS — I4891 Unspecified atrial fibrillation: Secondary | ICD-10-CM

## 2017-03-18 DIAGNOSIS — Z9889 Other specified postprocedural states: Secondary | ICD-10-CM

## 2017-03-18 DIAGNOSIS — I2510 Atherosclerotic heart disease of native coronary artery without angina pectoris: Secondary | ICD-10-CM | POA: Diagnosis not present

## 2017-03-18 DIAGNOSIS — I9789 Other postprocedural complications and disorders of the circulatory system, not elsewhere classified: Secondary | ICD-10-CM

## 2017-03-18 DIAGNOSIS — T8189XD Other complications of procedures, not elsewhere classified, subsequent encounter: Secondary | ICD-10-CM | POA: Diagnosis not present

## 2017-03-18 DIAGNOSIS — E1151 Type 2 diabetes mellitus with diabetic peripheral angiopathy without gangrene: Secondary | ICD-10-CM | POA: Diagnosis not present

## 2017-03-18 DIAGNOSIS — T8781 Dehiscence of amputation stump: Secondary | ICD-10-CM | POA: Diagnosis not present

## 2017-03-18 DIAGNOSIS — D649 Anemia, unspecified: Secondary | ICD-10-CM | POA: Diagnosis not present

## 2017-03-18 DIAGNOSIS — Q231 Congenital insufficiency of aortic valve: Secondary | ICD-10-CM

## 2017-03-18 DIAGNOSIS — I82603 Acute embolism and thrombosis of unspecified veins of upper extremity, bilateral: Secondary | ICD-10-CM

## 2017-03-18 LAB — POCT INR: INR: 1.8

## 2017-03-18 NOTE — Telephone Encounter (Signed)
Done.  See coumadin note. 

## 2017-03-18 NOTE — Telephone Encounter (Signed)
inr results  1.8

## 2017-03-21 DIAGNOSIS — I96 Gangrene, not elsewhere classified: Secondary | ICD-10-CM | POA: Diagnosis not present

## 2017-03-21 DIAGNOSIS — E1151 Type 2 diabetes mellitus with diabetic peripheral angiopathy without gangrene: Secondary | ICD-10-CM | POA: Diagnosis not present

## 2017-03-21 DIAGNOSIS — G2581 Restless legs syndrome: Secondary | ICD-10-CM | POA: Diagnosis not present

## 2017-03-21 DIAGNOSIS — I10 Essential (primary) hypertension: Secondary | ICD-10-CM | POA: Diagnosis not present

## 2017-03-21 DIAGNOSIS — I739 Peripheral vascular disease, unspecified: Secondary | ICD-10-CM | POA: Diagnosis not present

## 2017-03-21 DIAGNOSIS — E1159 Type 2 diabetes mellitus with other circulatory complications: Secondary | ICD-10-CM | POA: Diagnosis not present

## 2017-03-21 DIAGNOSIS — E1140 Type 2 diabetes mellitus with diabetic neuropathy, unspecified: Secondary | ICD-10-CM | POA: Diagnosis not present

## 2017-03-21 DIAGNOSIS — E782 Mixed hyperlipidemia: Secondary | ICD-10-CM | POA: Diagnosis not present

## 2017-03-25 ENCOUNTER — Ambulatory Visit (INDEPENDENT_AMBULATORY_CARE_PROVIDER_SITE_OTHER): Payer: Self-pay | Admitting: *Deleted

## 2017-03-25 DIAGNOSIS — I2510 Atherosclerotic heart disease of native coronary artery without angina pectoris: Secondary | ICD-10-CM | POA: Diagnosis not present

## 2017-03-25 DIAGNOSIS — D649 Anemia, unspecified: Secondary | ICD-10-CM | POA: Diagnosis not present

## 2017-03-25 DIAGNOSIS — T8781 Dehiscence of amputation stump: Secondary | ICD-10-CM | POA: Diagnosis not present

## 2017-03-25 DIAGNOSIS — Q231 Congenital insufficiency of aortic valve: Secondary | ICD-10-CM

## 2017-03-25 DIAGNOSIS — I9789 Other postprocedural complications and disorders of the circulatory system, not elsewhere classified: Secondary | ICD-10-CM

## 2017-03-25 DIAGNOSIS — T8189XD Other complications of procedures, not elsewhere classified, subsequent encounter: Secondary | ICD-10-CM | POA: Diagnosis not present

## 2017-03-25 DIAGNOSIS — I4891 Unspecified atrial fibrillation: Secondary | ICD-10-CM

## 2017-03-25 DIAGNOSIS — Z9889 Other specified postprocedural states: Secondary | ICD-10-CM

## 2017-03-25 DIAGNOSIS — I82603 Acute embolism and thrombosis of unspecified veins of upper extremity, bilateral: Secondary | ICD-10-CM

## 2017-03-25 DIAGNOSIS — E1151 Type 2 diabetes mellitus with diabetic peripheral angiopathy without gangrene: Secondary | ICD-10-CM | POA: Diagnosis not present

## 2017-03-25 LAB — POCT INR: INR: 1.6

## 2017-03-26 ENCOUNTER — Other Ambulatory Visit: Payer: Self-pay | Admitting: Thoracic Surgery (Cardiothoracic Vascular Surgery)

## 2017-03-26 DIAGNOSIS — Z952 Presence of prosthetic heart valve: Secondary | ICD-10-CM

## 2017-03-26 DIAGNOSIS — I2510 Atherosclerotic heart disease of native coronary artery without angina pectoris: Secondary | ICD-10-CM | POA: Diagnosis not present

## 2017-03-26 DIAGNOSIS — D649 Anemia, unspecified: Secondary | ICD-10-CM | POA: Diagnosis not present

## 2017-03-26 DIAGNOSIS — T8189XD Other complications of procedures, not elsewhere classified, subsequent encounter: Secondary | ICD-10-CM | POA: Diagnosis not present

## 2017-03-26 DIAGNOSIS — E1151 Type 2 diabetes mellitus with diabetic peripheral angiopathy without gangrene: Secondary | ICD-10-CM | POA: Diagnosis not present

## 2017-03-26 DIAGNOSIS — I4891 Unspecified atrial fibrillation: Secondary | ICD-10-CM | POA: Diagnosis not present

## 2017-03-26 DIAGNOSIS — T8781 Dehiscence of amputation stump: Secondary | ICD-10-CM | POA: Diagnosis not present

## 2017-03-29 ENCOUNTER — Ambulatory Visit
Admission: RE | Admit: 2017-03-29 | Discharge: 2017-03-29 | Disposition: A | Discharge status: Home / Self Care | Payer: Medicare Other | Source: Ambulatory Visit | Attending: Thoracic Surgery (Cardiothoracic Vascular Surgery) | Admitting: Thoracic Surgery (Cardiothoracic Vascular Surgery)

## 2017-03-29 ENCOUNTER — Ambulatory Visit (INDEPENDENT_AMBULATORY_CARE_PROVIDER_SITE_OTHER): Payer: Medicare Other | Admitting: Family

## 2017-03-29 ENCOUNTER — Ambulatory Visit (INDEPENDENT_AMBULATORY_CARE_PROVIDER_SITE_OTHER): Payer: Medicare Other | Admitting: Thoracic Surgery (Cardiothoracic Vascular Surgery)

## 2017-03-29 ENCOUNTER — Encounter: Payer: Self-pay | Admitting: Thoracic Surgery (Cardiothoracic Vascular Surgery)

## 2017-03-29 ENCOUNTER — Inpatient Hospital Stay: Payer: Medicare Other | Admitting: Physical Medicine & Rehabilitation

## 2017-03-29 VITALS — BP 97/71 | HR 61 | Resp 16 | Ht 61.0 in | Wt 161.0 lb

## 2017-03-29 DIAGNOSIS — Z951 Presence of aortocoronary bypass graft: Secondary | ICD-10-CM

## 2017-03-29 DIAGNOSIS — Z952 Presence of prosthetic heart valve: Secondary | ICD-10-CM | POA: Diagnosis not present

## 2017-03-29 DIAGNOSIS — J90 Pleural effusion, not elsewhere classified: Secondary | ICD-10-CM | POA: Diagnosis not present

## 2017-03-29 DIAGNOSIS — I2510 Atherosclerotic heart disease of native coronary artery without angina pectoris: Secondary | ICD-10-CM

## 2017-03-29 DIAGNOSIS — Z8679 Personal history of other diseases of the circulatory system: Secondary | ICD-10-CM

## 2017-03-29 DIAGNOSIS — Z9889 Other specified postprocedural states: Secondary | ICD-10-CM | POA: Diagnosis not present

## 2017-03-29 DIAGNOSIS — S68412D Complete traumatic amputation of left hand at wrist level, subsequent encounter: Secondary | ICD-10-CM

## 2017-03-29 DIAGNOSIS — S68411D Complete traumatic amputation of right hand at wrist level, subsequent encounter: Secondary | ICD-10-CM

## 2017-03-29 DIAGNOSIS — Z953 Presence of xenogenic heart valve: Secondary | ICD-10-CM

## 2017-03-29 DIAGNOSIS — Z89512 Acquired absence of left leg below knee: Secondary | ICD-10-CM

## 2017-03-29 DIAGNOSIS — Z89511 Acquired absence of right leg below knee: Secondary | ICD-10-CM

## 2017-03-29 MED ORDER — GABAPENTIN 400 MG PO CAPS: 400.0000 mg | ORAL_CAPSULE | Freq: Three times a day (TID) | ORAL | 0 refills | Status: DC

## 2017-03-29 NOTE — Progress Notes (Signed)
Office Visit Note   Patient: Scott [name]           Date of Birth: [birth year]           MRN: [id number] Visit Date: 03/29/2017              Requested by: Sasser, Silvestre Moment, MD [place], [place] [address] PCP: Manon Hilding, MD  No chief complaint on file.     HPI: The patient is a 71 year old gentleman who presents today in follow-up he is status post bilateral below the knee amputations as well as multiple finger amputations bilateral hands. The hands are well healed. Has been wearing medical compressions shrinkers bilaterally. Doing daily wound cleansing. Pleased with his progress. Has been following with [facility] for fabrication of his prostheses. States they have an appointment on Wednesday.  Assessment & Plan: Visit Diagnoses: No diagnosis found.  Plan: Him continue with daily wound cleansing bilateral below the knee amputations. Continue with shrinkers with direct skin contact. Follow with [facility] for fabrication of prosthetic. Have increased his dose of gabapentin for his neuropathic pain. We'll reevaluate him next appointment if no improvement may discontinue switched to Lyrica.  Follow-Up Instructions: Return in about 4 weeks (around 04/26/2017).   Ortho Exam  Patient is alert, oriented, no adenopathy, well-dressed, normal affect, normal respiratory effort. Bilateral hand incisions have healed well. The right hand incision continued to be tender. There is no surrounding erythema no palpable abscess. No open areas bilaterally. The right below the knee amputation has consolidated well. There continue be scattered eschar along the incision this was debrided. There is no underlying deep abscess noted drainage no surrounding erythema odor or sign of infection there is bleeding granulation tissue wound beds. Left below knee amputation consolidated well as well. There is scattered eschar which were debrided today. There is granulation tissue in the wound bed. There is no surrounding  erythema no odor  Imaging: No results found.  Labs: Lab Results  Component Value Date   HGBA1C 6.6 (H) 10/21/2016   REPTSTATUS 02/06/2017 FINAL 02/01/2017   GRAMSTAIN  02/01/2017    ABUNDANT WBC PRESENT, PREDOMINANTLY PMN MODERATE GRAM NEGATIVE RODS    CULT  02/01/2017    FEW PSEUDOMONAS AERUGINOSA MODERATE BACTEROIDES FRAGILIS BETA LACTAMASE POSITIVE    LABORGA PSEUDOMONAS AERUGINOSA 02/01/2017    Orders:  No orders of the defined types were placed in this encounter.  Meds ordered this encounter  Medications  . gabapentin (NEURONTIN) 400 MG capsule    Sig: Take 1 capsule (400 mg total) by mouth 3 (three) times daily.    Dispense:  90 capsule    Refill:  0     Procedures: No procedures performed  Clinical Data: No additional findings.  ROS:  All other systems negative, except as noted in the HPI. Review of Systems  Constitutional: Negative for chills and fever.  Musculoskeletal: Negative for myalgias.  Skin: Positive for wound.  Neurological: Positive for numbness. Negative for weakness.    Objective: Vital Signs: There were no vitals taken for this visit.  Specialty Comments:  No specialty comments available.  PMFS History: Patient Active Problem List   Diagnosis Date Noted  . Amputation of both hands with complication, subsequent encounter 03/01/2017  . Diabetes ([place]) 02/19/2017  . Status post bilateral below knee amputation ([place]) 02/09/2017  . Wound disruption, post-op, skin, sequela 02/09/2017  . Debility 02/05/2017  . Ganglion upper arm, right   . Thrombosis of both upper extremities   .  Suspected heparin induced thrombocytopenia (HIT) in hospitalized patient ([place])   . Gangrene of lower extremity ([place])   . Heparin induced thrombocytopenia (HIT) ([place])   . Tracheostomy status ([place])   . Chest tube in place   . Atherosclerosis of native arteries of extremities with gangrene, left leg ([place])   . Atherosclerosis of native arteries of extremities  with gangrene, right leg ([place])   . Acute on chronic diastolic CHF (congestive heart failure) ([place])   . Enteritis due to Clostridium difficile   . Anasarca   . FUO (fever of unknown origin)   . Acute encephalopathy   . Elevated LFTs   . DIC (disseminated intravascular coagulation) ([place]) 10/28/2016  . Postoperative atrial fibrillation ([place]) 10/24/2016  . Cardiogenic shock ([place])   . Mitral regurgitation due to cusp prolapse 10/22/2016  . S/P aortic valve replacement with bioprosthetic valve 10/22/2016  . S/P ascending aortic aneurysm repair 10/22/2016  . S/P mitral valve repair 10/22/2016  . Hx of CABG 10/22/2016  . Thrombocytopenia ([place]) 10/22/2016  . Acute combined systolic and diastolic congestive heart failure ([place]) 10/22/2016  . Acute respiratory failure ([place]) 10/22/2016  . Thoracic ascending aortic aneurysm ([place]) 10/21/2016  . Coronary artery disease involving native coronary artery of native heart with unstable angina pectoris ([place]) 10/20/2016  . Severe aortic stenosis by prior echocardiography 10/19/2016  . Unstable angina ([place]) 10/19/2016  . Aortic valve stenosis 10/19/2016  . Bicuspid aortic valve 10/19/2016  . Trigger ring finger of right hand   . Wears glasses   . Gout   . Hypertension   . Carpal tunnel syndrome of right wrist   . Arthritis   . Snores    Past Medical History:  Diagnosis Date  . Arthritis    "hips, shoulders; knees; back" (10/20/2016)  . Bicuspid aortic valve   . BPH (benign prostatic hypertrophy)   . Carpal tunnel syndrome of right wrist   . Coronary artery disease involving native coronary artery of native heart with unstable angina pectoris ([place]) 10/20/2016  . Diverticulitis   . GERD (gastroesophageal reflux disease)   . Gout   . Heart murmur   . Hyperlipemia   . Hypertension   . Postoperative atrial fibrillation ([place]) 10/24/2016  . RLS (restless legs syndrome)   . S/P aortic valve replacement with bioprosthetic valve 10/22/2016   25 mm  [facility] Ease bovine pericardial bioprosthetic tissue valve  . S/P ascending aortic aneurysm repair 10/22/2016   28 mm supracoronary straight graft replacement of ascending thoracic aortic aneurysm  . S/P CABG x 3 10/22/2016   Sequential LIMA to Diag and LAD, SVG to distal LAD, open vein harvest right thigh  . S/P mitral valve repair 10/22/2016   Artificial Gore-tex neochord placement x6 - posterior annuloplasty band placed but removed due to systolic anterior motion of mitral valve  . Severe aortic stenosis   . Snores    Never been tested for sleep apnea  . Thoracic ascending aortic aneurysm ([place]) 10/21/2016  . Thrombocytopenia ([place]) 10/22/2016  . Type II diabetes mellitus ([facility])     Family History  Problem Relation Age of Onset  . Lung cancer Mother   . Clotting disorder Father     No details  . Heart disease Sister 53    Stents  . Cancer Sister     Throat    Past Surgical History:  Procedure Laterality Date  . AMPUTATION Bilateral 12/11/2016   Procedure: AMPUTATION BELOW KNEE BILATERALLY;  Surgeon: [name], MD;  Location: [facility];  Service: Orthopedics;  Laterality: Bilateral;  . AMPUTATION Bilateral 12/11/2016   Procedure: AMPUTATION BILATERAL HANDS EXCEPT RIGHT THUMB;  Surgeon: [name], MD;  Location: [facility];  Service: Orthopedics;  Laterality: Bilateral;  . AORTIC VALVE REPLACEMENT N/A 10/22/2016   Procedure: AORTIC VALVE REPLACEMENT (AVR) WITH SIZE 25 MM MAGNA EASE PERICARDIAL BIOPROSTHESIS - AORTIC;  Surgeon: Rexene Alberts, MD;  Location: [facility];  Service: Open Heart Surgery;  Laterality: N/A;  . BONE EXCISION Right 02/01/2017   Procedure: EXCISION RIGHT INDEX METACARPAL HEAD;  Surgeon: [name], MD;  Location: [facility];  Service: Orthopedics;  Laterality: Right;  . CARDIAC CATHETERIZATION N/A 10/20/2016   Procedure: Right/Left Heart Cath and Coronary Angiography;  Surgeon: Leonie Man, MD;  Location: [facility];  Service: Cardiovascular;   Laterality: N/A;  . CARPAL TUNNEL RELEASE Right 11/28/2013   Procedure: RIGHT WRIST CARPAL TUNNEL RELEASE;  Surgeon: Lorn Junes, MD;  Location: [facility];  Service: Orthopedics;  Laterality: Right;  . CATARACT EXTRACTION W/ INTRAOCULAR LENS  IMPLANT, BILATERAL Bilateral 1978  . COLONOSCOPY    . CORONARY ARTERY BYPASS GRAFT N/A 10/22/2016   Procedure: CORONARY ARTERY BYPASS GRAFTING (CABG)x 2 WITH LIMA TO DIAGONAL, OPEN  HARVESTING OF RIGHT SAPHENOUS VEIN FOR VEIN GRAFT TO LAD;  Surgeon: Rexene Alberts, MD;  Location: [facility];  Service: Open Heart Surgery;  Laterality: N/A;  . FRACTURE SURGERY    . INGUINAL HERNIA REPAIR Right 1998  . IR GENERIC HISTORICAL  12/07/2016   IR US GUIDE VASC ACCESS RIGHT 12/07/2016 Aletta Edouard, MD [facility]  . IR GENERIC HISTORICAL  12/07/2016   IR RADIOLOGY PERIPHERAL GUIDED IV START 12/07/2016 Aletta Edouard, MD [facility]  . IR GENERIC HISTORICAL  12/07/2016   IR GASTROSTOMY TUBE MOD SED 12/07/2016 Aletta Edouard, MD [facility]  . LIPOMA EXCISION Right 2008   "side of my head"  . MITRAL VALVE REPAIR N/A 10/22/2016   Procedure: MITRAL VALVE REPAIR (MVR) WITH SIZE 30 SORIN ANNULOFLEX ANNULOPLASTY RING WITH SUBSEQUENT REMOVAL OF RING;  Surgeon: Rexene Alberts, MD;  Location: [facility];  Service: Open Heart Surgery;  Laterality: N/A;  . PENECTOMY  2007   Peyronie's disease   . PENILE PROSTHESIS IMPLANT  2009  . REMOVAL OF PENILE PROSTHESIS N/A 02/01/2017   Procedure: REMOVAL OF PENILE PROSTHESIS;  Surgeon: Kathie Rhodes, MD;  Location: [facility];  Service: Urology;  Laterality: N/A;  . SHOULDER OPEN ROTATOR CUFF REPAIR Right 2006  . STERNAL CLOSURE N/A 10/26/2016   Procedure: STERNAL WASHOUT AND DELAYED PRIMARY CLOSURE;  Surgeon: Rexene Alberts, MD;  Location: [facility];  Service: Thoracic;  Laterality: N/A;  . TEE WITHOUT CARDIOVERSION N/A 10/26/2016   Procedure: TRANSESOPHAGEAL ECHOCARDIOGRAM (TEE);  Surgeon: Rexene Alberts, MD;  Location: [facility];   Service: Thoracic;  Laterality: N/A;  . TEE WITHOUT CARDIOVERSION N/A 10/22/2016   Procedure: TRANSESOPHAGEAL ECHOCARDIOGRAM (TEE);  Surgeon: Rexene Alberts, MD;  Location: [facility];  Service: Open Heart Surgery;  Laterality: N/A;  . THORACIC AORTIC ANEURYSM REPAIR  10/22/2016   Procedure: ASCENDING AORTIC  ANEURYSM REPAIR (AAA) WITH 28 MM HEMASHIELD PLATINUM WOVEN DOUBLE VELOUR VASCULAR GRAFT;  Surgeon: Rexene Alberts, MD;  Location: [facility];  Service: Open Heart Surgery;;  . TONSILLECTOMY  ~ 1955  . TRACHEOSTOMY TUBE PLACEMENT N/A 11/09/2016   Procedure: TRACHEOSTOMY;  Surgeon: Rexene Alberts, MD;  Location: [facility];  Service: Thoracic;  Laterality: N/A;  .  TRANSURETHRAL RESECTION OF PROSTATE  2005  . TRIGGER FINGER RELEASE Bilateral    several lt and rt hands  . TRIGGER FINGER RELEASE Right 11/28/2013   Procedure: RIGHT TRIGGER FINGER  RELEASE (TENDON SHEATH INCISION);  Surgeon: Lorn Junes, MD;  Location: [facility];  Service: Orthopedics;  Laterality: Right;  Marland Kitchen VIDEO BRONCHOSCOPY N/A 11/09/2016   Procedure: VIDEO BRONCHOSCOPY;  Surgeon: Rexene Alberts, MD;  Location: [facility];  Service: Thoracic;  Laterality: N/A;  . WRIST FRACTURE SURGERY Right ~ 1959   Social History   Occupational History  . [employer]    retired   Social History Main Topics  . Smoking status: Former Smoker    Years: 3.00    Types: Pipe, Cigars    Quit date: 1984  . Smokeless tobacco: Never Used  . Alcohol use 6.0 oz/week    10 Cans of beer per week  . Drug use: No  . Sexual activity: Not Currently

## 2017-03-29 NOTE — Addendum Note (Signed)
Addended by: Dondra Prader R on: 03/29/2017 12:33 PM   Modules accepted: Orders

## 2017-03-29 NOTE — Patient Instructions (Addendum)
Continue all previous medications without any changes at this time.  Discuss the possibility of stopping amiodarone with Dr. Harl Bowie at your next office visit.  Endocarditis is a potentially serious infection of heart valves or inside lining of the heart.  It occurs more commonly in patients with diseased heart valves (such as patient's with aortic or mitral valve disease) and in patients who have undergone heart valve repair or replacement.  Certain surgical and dental procedures may put you at risk, such as dental cleaning, other dental procedures, or any surgery involving the respiratory, urinary, gastrointestinal tract, gallbladder or prostate gland.   To minimize your chances for develooping endocarditis, maintain good oral health and seek prompt medical attention for any infections involving the mouth, teeth, gums, skin or urinary tract.    Always notify your doctor or dentist about your underlying heart valve condition before having any invasive procedures. You will need to take antibiotics before certain procedures, including all routine dental cleanings or other dental procedures.  Your cardiologist or dentist should prescribe these antibiotics for you to be taken ahead of time.

## 2017-03-29 NOTE — Progress Notes (Signed)
Scott [name]       Scott [name],Scott [name]             [phone]     CARDIOTHORACIC SURGERY OFFICE NOTE  Referring Provider is Arnoldo Lenis, MD PCP is Quintin Alto Silvestre Moment, MD   HPI:  Patient is a 71 year old male with complex past medical history including bicuspid aortic valve with aortic stenosis, mitral regurgitation, coronary artery disease, hypertension, type 2 diabetes mellitus, and remote history of tobacco use who presented in November 2017 with unstable angina pectoris. He was diagnosed with severe symptomatic aortic stenosis, coronary artery disease, and ascending thoracic aortic aneurysm. He underwent elective aortic valve replacement using a bioprosthetic tissue valve, supra coronary straight graft repair of ascending thoracic aortic aneurysm, and coronary artery bypass grafting 3 on 10/22/2016. Intraoperative findings were notable for the surprising finding of mitral valve prolapse with multiple ruptured primary chordae tendineae causing severe mitral regurgitation necessitating concomitant mitral valve repair. Subsequently with initial attempts to wean from cardiopulmonary bypass the patient developed severe systolic anterior motion of the mitral valve requiring removal of the posterior annuloplasty band that had initially been placed as part of complex mitral valve repair. The patient's subsequent postoperative convalescence was extremely complicated and prolonged. He developed gangrenous necrosis of fingers and toes on all 4 extremities because of prolonged need for inotropic support. He experienced respiratory failure requiring prolonged ventilatory support and tracheostomy, recurrent paroxysmal atrial fibrillation, and C. difficile colitis. His subsequent convalescence was extremely slow and [facility]. He eventually underwent bilateral below-knee amputation as well as  amputation of almost all of the digits on both hands.  He slowly improved and eventually was discharged to the inpatient rehabilitation facility and more recently discharged home with his family. He has been followed regular by Dr. Sharol Given and recently has been fitted for bilateral lower extremity prosthetic limbs. He has been seen in follow-up by Dr. Harl Bowie at [facility] and he returns to our office for follow-up today.  He has done remarkably well under the circumstances and he has remained very stable from a cardiac standpoint. Most recent follow-up transthoracic echocardiogram performed 02/15/2017 revealed normal left ventricular systolic function with ejection fraction estimated 55-60%, normal functioning bioprosthetic tissue valve in the aortic position, and only mild residual mitral regurgitation. The patient returns to our office today for follow-up with his wife and daughter present. He is cheerful and reports that he is doing well. He has apparently made tremendous strides with his rehabilitation and he hopes to receive his lower extremity prostheses in the near future.  Current Outpatient Prescriptions  Medication Sig Dispense Refill  . amiodarone (PACERONE) 100 MG tablet Take 1 tablet (100 mg total) by mouth daily. 30 tablet 3  . aspirin 81 MG tablet Take 81 mg by mouth 2 (two) times daily.     Marland Kitchen atenolol (TENORMIN) 25 MG tablet Take 1 tablet (25 mg total) by mouth daily. 30 tablet 3  . atorvastatin (LIPITOR) 40 MG tablet Take 1 tablet (40 mg total) by mouth daily at 6 PM. 30 tablet 3  . blood glucose meter kit and supplies KIT Dispense based on patient and insurance preference. Use up to four times daily as directed. (FOR ICD-9 250.00, 250.01). 1 each 0  . digoxin (LANOXIN) 0.125 MG tablet Take 1 tablet (0.125 mg total) by mouth daily. 30 tablet 3  . famotidine (PEPCID) 20 MG  tablet Take 1 tablet (20 mg total) by mouth 2 (two) times daily. 60 tablet 0  . ferrous sulfate 325 (65 FE) MG tablet  Take 1 tablet (325 mg total) by mouth 2 (two) times daily with a meal. 60 tablet 0  . fluticasone (FLONASE) 50 MCG/ACT nasal spray Place 1 spray into both nostrils daily. (Patient taking differently: Place 1 spray into both nostrils daily as needed. ) 9.9 g 2  . furosemide (LASIX) 40 MG tablet Take 1 tablet (40 mg total) by mouth 2 (two) times daily. 60 tablet 3  . gabapentin (NEURONTIN) 400 MG capsule Take 1 capsule (400 mg total) by mouth 3 (three) times daily. 90 capsule 0  . Insulin Glargine (LANTUS) 100 UNIT/ML Solostar Pen Inject into the skin. Now taking 5 units one time a day    . Insulin Pen Needle (CAREFINE PEN NEEDLES) 32G X 5 MM MISC 1 application by Does not apply route 2 (two) times daily. 100 each 0  . lisinopril (PRINIVIL,ZESTRIL) 2.5 MG tablet Take 1 tablet (2.5 mg total) by mouth daily. 30 tablet 3  . Melatonin 10 MG CAPS Take 10 mg by mouth at bedtime.     . nitroGLYCERIN (NITROSTAT) 0.4 MG SL tablet Place 0.4 mg under the tongue every 5 (five) minutes as needed.     . potassium chloride SA (K-DUR,KLOR-CON) 20 MEQ tablet Take 1 tablet (20 mEq total) by mouth 2 (two) times daily. 60 tablet 3  . rOPINIRole (REQUIP) 2 MG tablet Take 2 mg by mouth See admin instructions. Takes 4m at 4:30pm and 277mat 9:30pm.    . sertraline (ZOLOFT) 50 MG tablet Take 1.5 tablets (75 mg total) by mouth daily. 45 tablet 0  . Turmeric 500 MG CAPS Take 500 mg by mouth 2 (two) times daily.     . vitamin C (VITAMIN C) 500 MG tablet Take 1 tablet (500 mg total) by mouth 2 (two) times daily. 60 tablet 0  . warfarin (COUMADIN) 2 MG tablet Take 1 tablet (2 mg total) by mouth daily at 6 PM. 30 tablet 3  . zinc sulfate 220 (50 Zn) MG capsule Take 1 capsule (220 mg total) by mouth 2 (two) times daily. 60 capsule 0   No current facility-administered medications for this visit.       Physical Exam:   BP 97/71 (BP Location: Left Arm, Patient Position: Sitting, Cuff Size: Large)   Pulse 61   Resp 16   Ht 5'  1" (1.549 m)   Wt 161 lb (73 kg)   SpO2 97% Comment: ON RA  BMI 30.42 kg/m   General:  Debilitated but well appearing  Chest:   clear  CV:   RRR w/ grade II-III systolic murmur best LLSB  Incisions:  healed  Abdomen:  soft  Extremities:  warm  Diagnostic Tests:  Transthoracic Echocardiography  Patient:    TuVerlie, HellenbrandR #:       [id number] Date: 02/15/2017 Gender:     M Age:        70 Height:     152.4 cm Weight:     71.4 kg BSA:        1.77 m^2 Pt. Status: Room:       [id number] ORAndrena MewsM.D.  REFERRING    ClDarylene PriceM.D.  ADMITTING    Swartz, ZaDeep WaterInpatient  SONOGRAPHER  Darlina Sicilian, RDCS  cc:  ------------------------------------------------------------------- LV EF: 55% -   60%  ------------------------------------------------------------------- Indications:      Aortic stenosis 424.1.  ------------------------------------------------------------------- History:   Risk factors:  Hypertension. Diabetes mellitus. Dyslipidemia.  ------------------------------------------------------------------- Study Conclusions  - Left ventricle: The cavity size was normal. Wall thickness was   increased in a pattern of moderate LVH. Systolic function was   normal. The estimated ejection fraction was in the range of 55%   to 60%. Wall motion was normal; there were no regional wall   motion abnormalities. Features are consistent with a pseudonormal   left ventricular filling pattern, with concomitant abnormal   relaxation and increased filling pressure (grade 2 diastolic   dysfunction). Doppler parameters are consistent with high   ventricular filling pressure. - Aortic valve: A bioprosthesis was present and functioning   normally. Transvalvular velocity was within the normal range.   There was no stenosis. There was no regurgitation. Valve area   (VTI): 2.26 cm^2. Valve  area (Vmax): 1.82 cm^2. Valve area   (Vmean): 1.86 cm^2. - Mitral valve: Moderately calcified annulus. Thickening of the   posterior leaflet. Transvalvular velocity was within the normal   range. There was no evidence for stenosis. There was mild   regurgitation. Valve area by continuity equation (using LVOT   flow): 1.45 cm^2. - Left atrium: The atrium was moderately dilated. - Right ventricle: The cavity size was normal. Wall thickness was   normal. Systolic function was normal. - Tricuspid valve: There was mild regurgitation. - Pulmonary arteries: Systolic pressure was within the normal   range. PA peak pressure: 21 mm Hg (S).  ------------------------------------------------------------------- Labs, prior tests, procedures, and surgery: Coronary artery bypass grafting (10/22/2016).     Mitral valve repair.  Aortic valve replacement with a 2.5 cmSorin-Puig-Messana ring.  ------------------------------------------------------------------- Study data:  Comparison was made to the study of 11/18/2016.  Study status:  Routine.  Procedure:  The patient reported no pain pre or post test. Transthoracic echocardiography. Image quality was poor. The study was technically difficult, as a result of poor acoustic windows and surgical dressings.  Study completion:  There were no complications.          Transthoracic echocardiography.  M-mode, complete 2D, spectral Doppler, and color Doppler.  Birthdate: Patient birthdate: 03/29/1946.  Age:  Patient is 71 yr old.  Sex: Gender: male.    BMI: 30.7 kg/m^2.  Blood pressure:     115/72 Patient status:  Inpatient.  Study date:  Study date: 02/15/2017. Study time: 11:20 AM.  Location:  Bedside.  -------------------------------------------------------------------  ------------------------------------------------------------------- Left ventricle:  The cavity size was normal. Wall thickness was increased in a pattern of moderate LVH. Systolic  function was normal. The estimated ejection fraction was in the range of 55% to 60%. Wall motion was normal; there were no regional wall motion abnormalities. Features are consistent with a pseudonormal left ventricular filling pattern, with concomitant abnormal relaxation and increased filling pressure (grade 2 diastolic dysfunction). Doppler parameters are consistent with high ventricular filling pressure.  ------------------------------------------------------------------- Aortic valve:  A bioprosthesis was present and functioning normally. Mobility was not restricted.  Doppler:  Transvalvular velocity was within the normal range. There was no stenosis. There was no regurgitation.    VTI ratio of LVOT to aortic valve: 0.59. Valve area (VTI): 2.26 cm^2. Indexed valve area (VTI): 1.28 cm^2/m^2. Peak velocity ratio of LVOT to aortic valve: 0.48. Valve area (Vmax): 1.82 cm^2. Indexed valve area (Vmax): 1.03 cm^2/m^2. Mean velocity  ratio of LVOT to aortic valve: 0.49. Valve area (Vmean): 1.86 cm^2. Indexed valve area (Vmean): 1.05 cm^2/m^2. Mean gradient (S): 6 mm Hg. Peak gradient (S): 15 mm Hg.  ------------------------------------------------------------------- Aorta:  Aortic root: The aortic root was normal in size.  ------------------------------------------------------------------- Mitral valve:   Moderately calcified annulus.  Thickening of the posterior leaflet. Mobility was not restricted.  Doppler: Transvalvular velocity was within the normal range. There was no evidence for stenosis. There was mild regurgitation.    Valve area by pressure half-time: 3.38 cm^2. Indexed valve area by pressure half-time: 1.91 cm^2/m^2. Valve area by continuity equation (using LVOT flow): 1.45 cm^2. Indexed valve area by continuity equation (using LVOT flow): 0.82 cm^2/m^2.    Mean gradient (D): 3 mm Hg. Peak gradient (D): 11 mm  Hg.  ------------------------------------------------------------------- Left atrium:  The atrium was moderately dilated.  ------------------------------------------------------------------- Right ventricle:  The cavity size was normal. Wall thickness was normal. Systolic function was normal.  ------------------------------------------------------------------- Pulmonic valve:    Structurally normal valve.   Cusp separation was normal.  Doppler:  Transvalvular velocity was within the normal range. There was no evidence for stenosis. There was no regurgitation.  ------------------------------------------------------------------- Tricuspid valve:   Structurally normal valve.    Doppler: Transvalvular velocity was within the normal range. There was mild regurgitation.  ------------------------------------------------------------------- Pulmonary artery:   The main pulmonary artery was normal-sized. Systolic pressure was within the normal range.  ------------------------------------------------------------------- Right atrium:  The atrium was normal in size.  ------------------------------------------------------------------- Pericardium:  There was no pericardial effusion.  ------------------------------------------------------------------- Systemic veins: Inferior vena cava: The vessel was normal in size. The respirophasic diameter changes were blunted (< 50%), consistent with normal central venous pressure.  ------------------------------------------------------------------- Measurements   Left ventricle                            Value          Reference  LV ID, ED, PLAX chordal           (L)     41    mm       43 - 52  LV ID, ES, PLAX chordal           (H)     39    mm       23 - 38  LV PW thickness, ED                       14    mm       ---------  IVS/LV PW ratio, ED                       1.01           <=1.3  Stroke volume/bsa, 2D                     38     ml/m^2   ---------  LV e&', lateral                            5.77  cm/s     ---------  LV E/e&', lateral                          28.94          ---------  LV e&', medial  4.13  cm/s     ---------  LV E/e&', medial                           40.44          ---------  LV e&', average                            4.95  cm/s     ---------  LV E/e&', average                          33.74          ---------    Ventricular septum                        Value          Reference  IVS thickness, ED                         14.13 mm       ---------    LVOT                                      Value          Reference  LVOT ID, S                                22    mm       ---------  LVOT area                                 3.8   cm^2     ---------  LVOT peak velocity, S                     93.8  cm/s     ---------  LVOT mean velocity, S                     54.7  cm/s     ---------  LVOT VTI, S                               18    cm       ---------  LVOT peak gradient, S                     4     mm Hg    ---------    Aortic valve                              Value          Reference  Aortic valve peak velocity, S             196   cm/s     ---------  Aortic valve mean velocity, S             112   cm/s     ---------  Aortic valve VTI, S  30.3  cm       ---------  Aortic mean gradient, S                   6     mm Hg    ---------  Aortic peak gradient, S                   15    mm Hg    ---------  VTI ratio, LVOT/AV                        0.59           ---------  Aortic valve area, VTI                    2.26  cm^2     ---------  Aortic valve area/bsa, VTI                1.28  cm^2/m^2 ---------  Velocity ratio, peak, LVOT/AV             0.48           ---------  Aortic valve area, peak velocity          1.82  cm^2     ---------  Aortic valve area/bsa, peak               1.03  cm^2/m^2 ---------  velocity  Velocity ratio, mean, LVOT/AV              0.49           ---------  Aortic valve area, mean velocity          1.86  cm^2     ---------  Aortic valve area/bsa, mean               1.05  cm^2/m^2 ---------  velocity    Aorta                                     Value          Reference  Aortic root ID, ED                        28    mm       ---------    Left atrium                               Value          Reference  LA ID, A-P, ES                            46    mm       ---------  LA ID/bsa, A-P                    (H)     2.6   cm/m^2   <=2.2  LA volume, S                              64.5  ml       ---------  LA volume/bsa, S  36.5  ml/m^2   ---------  LA volume, ES, 1-p A4C                    64.8  ml       ---------  LA volume/bsa, ES, 1-p A4C                36.7  ml/m^2   ---------  LA volume, ES, 1-p A2C                    59.2  ml       ---------  LA volume/bsa, ES, 1-p A2C                33.5  ml/m^2   ---------    Mitral valve                              Value          Reference  Mitral E-wave peak velocity               167   cm/s     ---------  Mitral A-wave peak velocity               68.1  cm/s     ---------  Mitral mean velocity, D                   68.4  cm/s     ---------  Mitral deceleration time                  222   ms       150 - 230  Mitral pressure half-time                 65    ms       ---------  Mitral mean gradient, D                   3     mm Hg    ---------  Mitral peak gradient, D                   11    mm Hg    ---------  Mitral E/A ratio, peak                    2.5            ---------  Mitral valve area, PHT, DP                3.38  cm^2     ---------  Mitral valve area/bsa, PHT, DP            1.91  cm^2/m^2 ---------  Mitral valve area, LVOT                   1.45  cm^2     ---------  continuity  Mitral valve area/bsa, LVOT               0.82  cm^2/m^2 ---------  continuity  Mitral annulus VTI, D                     47.1  cm       ---------    Pulmonary  arteries  Value          Reference  PA pressure, S, DP                        21    mm Hg    <=30    Tricuspid valve                           Value          Reference  Tricuspid regurg peak velocity            182.1 cm/s     ---------  Tricuspid peak RV-RA gradient             13    mm Hg    ---------  Tricuspid maximal regurg                  182.1 cm/s     ---------  velocity, PISA    Systemic veins                            Value          Reference  Estimated CVP                             8     mm Hg    ---------    Right ventricle                           Value          Reference  TAPSE                                     13.7  mm       ---------  RV pressure, S, DP                        21    mm Hg    <=30  RV s&', lateral, S                         7.07  cm/s     ---------  Legend: (L)  and  (H)  mark values outside specified reference range.  ------------------------------------------------------------------- Prepared and Electronically Authenticated by  Skeet Latch, MD 2018-03-26T15:08:01   Impression:   Patient appears to be doing remarkably well under the circumstances after having experienced an extremely complicated postoperative recovery following aortic valve replacement, mitral valve repair, coronary artery bypass grafting, and supracoronary straight graft replacement of ascending thoracic aortic aneurysm.   Plan:  We have not recommended any changes to the patient's current medications. I have reviewed the patient's complex postoperative course as well as intraoperative events at length with the patient and his family at the bedside. We discussed the results of the patient's most recent follow-up echocardiogram and long term prognosis. All of their questions have been addressed.  The patient has been reminded regarding the importance of dental hygiene and the lifelong need for antibiotic prophylaxis for all dental cleanings and  other related invasive procedures.  The patient will continue to follow up regularly with Dr. Harl Bowie. He will return to our office  for follow-up next November, approximately 1 year following his surgery. He will call and return sooner should specific problems or questions arise.   I spent in excess of 30 minutes during the conduct of this office consultation and >50% of this time involved direct face-to-face encounter with the patient for counseling and/or coordination of their care.    Valentina Gu. Roxy Manns, MD 03/29/2017 1:21 PM

## 2017-03-30 ENCOUNTER — Ambulatory Visit (INDEPENDENT_AMBULATORY_CARE_PROVIDER_SITE_OTHER): Payer: Self-pay | Admitting: *Deleted

## 2017-03-30 DIAGNOSIS — Z9889 Other specified postprocedural states: Secondary | ICD-10-CM

## 2017-03-30 DIAGNOSIS — T8189XD Other complications of procedures, not elsewhere classified, subsequent encounter: Secondary | ICD-10-CM | POA: Diagnosis not present

## 2017-03-30 DIAGNOSIS — I2510 Atherosclerotic heart disease of native coronary artery without angina pectoris: Secondary | ICD-10-CM | POA: Diagnosis not present

## 2017-03-30 DIAGNOSIS — I4891 Unspecified atrial fibrillation: Secondary | ICD-10-CM

## 2017-03-30 DIAGNOSIS — D649 Anemia, unspecified: Secondary | ICD-10-CM | POA: Diagnosis not present

## 2017-03-30 DIAGNOSIS — I82603 Acute embolism and thrombosis of unspecified veins of upper extremity, bilateral: Secondary | ICD-10-CM

## 2017-03-30 DIAGNOSIS — I9789 Other postprocedural complications and disorders of the circulatory system, not elsewhere classified: Secondary | ICD-10-CM

## 2017-03-30 DIAGNOSIS — T8781 Dehiscence of amputation stump: Secondary | ICD-10-CM | POA: Diagnosis not present

## 2017-03-30 DIAGNOSIS — Q231 Congenital insufficiency of aortic valve: Secondary | ICD-10-CM

## 2017-03-30 DIAGNOSIS — E1151 Type 2 diabetes mellitus with diabetic peripheral angiopathy without gangrene: Secondary | ICD-10-CM | POA: Diagnosis not present

## 2017-03-30 LAB — POCT INR: INR: 1.8

## 2017-03-30 MED ORDER — WARFARIN SODIUM 2 MG PO TABS: ORAL_TABLET | ORAL | 3 refills | Status: DC

## 2017-03-31 ENCOUNTER — Telehealth (INDEPENDENT_AMBULATORY_CARE_PROVIDER_SITE_OTHER): Payer: Self-pay | Admitting: *Deleted

## 2017-03-31 ENCOUNTER — Telehealth (INDEPENDENT_AMBULATORY_CARE_PROVIDER_SITE_OTHER): Payer: Self-pay | Admitting: Orthopedic Surgery

## 2017-03-31 DIAGNOSIS — S68411D Complete traumatic amputation of right hand at wrist level, subsequent encounter: Secondary | ICD-10-CM

## 2017-03-31 DIAGNOSIS — Z89512 Acquired absence of left leg below knee: Secondary | ICD-10-CM

## 2017-03-31 DIAGNOSIS — S68412D Complete traumatic amputation of left hand at wrist level, subsequent encounter: Secondary | ICD-10-CM

## 2017-03-31 DIAGNOSIS — Z89511 Acquired absence of right leg below knee: Secondary | ICD-10-CM

## 2017-03-31 NOTE — Telephone Encounter (Signed)
I called and spoke with Scott [name] patients daughter to advise referral was placed for [facility] with [facility].

## 2017-03-31 NOTE — Telephone Encounter (Signed)
Brandy called needing clarification on medication Neurontin, states he was suppose to be on 300mg  but instead is taking 400mg , says saw NP Erin on Monday and she prescribed the medication and was sure why she changed it and wife couldn't remember why it was changed. Wants to know if pt should be on 400mg  instead of 300mg . Please advise!  [phone]

## 2017-03-31 NOTE — Telephone Encounter (Signed)
Patient's wife Danton Clap) called asked if Dr Sharol Given would referr patient to [facility]- Attn: Jamey Reas. She advised they are working with [place]. The number to contact Danton Clap is [phone]

## 2017-03-31 NOTE — Telephone Encounter (Signed)
Called to advise that the change was made to assist with his neuropathic pain and at his next office visit if the pt does not report an improvement with the increase that we would discontinue the medication and change to lyrica. Lm on vm for [facility] to call with questions.

## 2017-04-01 DIAGNOSIS — T8781 Dehiscence of amputation stump: Secondary | ICD-10-CM | POA: Diagnosis not present

## 2017-04-01 DIAGNOSIS — D649 Anemia, unspecified: Secondary | ICD-10-CM | POA: Diagnosis not present

## 2017-04-01 DIAGNOSIS — E1151 Type 2 diabetes mellitus with diabetic peripheral angiopathy without gangrene: Secondary | ICD-10-CM | POA: Diagnosis not present

## 2017-04-01 DIAGNOSIS — I4891 Unspecified atrial fibrillation: Secondary | ICD-10-CM | POA: Diagnosis not present

## 2017-04-01 DIAGNOSIS — T8189XD Other complications of procedures, not elsewhere classified, subsequent encounter: Secondary | ICD-10-CM | POA: Diagnosis not present

## 2017-04-01 DIAGNOSIS — I2510 Atherosclerotic heart disease of native coronary artery without angina pectoris: Secondary | ICD-10-CM | POA: Diagnosis not present

## 2017-04-07 DIAGNOSIS — D649 Anemia, unspecified: Secondary | ICD-10-CM | POA: Diagnosis not present

## 2017-04-07 DIAGNOSIS — E1151 Type 2 diabetes mellitus with diabetic peripheral angiopathy without gangrene: Secondary | ICD-10-CM | POA: Diagnosis not present

## 2017-04-07 DIAGNOSIS — T8189XD Other complications of procedures, not elsewhere classified, subsequent encounter: Secondary | ICD-10-CM | POA: Diagnosis not present

## 2017-04-07 DIAGNOSIS — I4891 Unspecified atrial fibrillation: Secondary | ICD-10-CM | POA: Diagnosis not present

## 2017-04-07 DIAGNOSIS — I2510 Atherosclerotic heart disease of native coronary artery without angina pectoris: Secondary | ICD-10-CM | POA: Diagnosis not present

## 2017-04-07 DIAGNOSIS — T8781 Dehiscence of amputation stump: Secondary | ICD-10-CM | POA: Diagnosis not present

## 2017-04-08 ENCOUNTER — Ambulatory Visit (INDEPENDENT_AMBULATORY_CARE_PROVIDER_SITE_OTHER): Payer: Self-pay | Admitting: *Deleted

## 2017-04-08 DIAGNOSIS — I4891 Unspecified atrial fibrillation: Secondary | ICD-10-CM

## 2017-04-08 DIAGNOSIS — I9789 Other postprocedural complications and disorders of the circulatory system, not elsewhere classified: Secondary | ICD-10-CM

## 2017-04-08 DIAGNOSIS — Z7901 Long term (current) use of anticoagulants: Secondary | ICD-10-CM | POA: Diagnosis not present

## 2017-04-08 DIAGNOSIS — T8189XD Other complications of procedures, not elsewhere classified, subsequent encounter: Secondary | ICD-10-CM | POA: Diagnosis not present

## 2017-04-08 DIAGNOSIS — I2510 Atherosclerotic heart disease of native coronary artery without angina pectoris: Secondary | ICD-10-CM | POA: Diagnosis not present

## 2017-04-08 DIAGNOSIS — T8781 Dehiscence of amputation stump: Secondary | ICD-10-CM | POA: Diagnosis not present

## 2017-04-08 DIAGNOSIS — E1151 Type 2 diabetes mellitus with diabetic peripheral angiopathy without gangrene: Secondary | ICD-10-CM | POA: Diagnosis not present

## 2017-04-08 DIAGNOSIS — Q2381 Bicuspid aortic valve: Secondary | ICD-10-CM

## 2017-04-08 DIAGNOSIS — Z9889 Other specified postprocedural states: Secondary | ICD-10-CM

## 2017-04-08 DIAGNOSIS — Q231 Congenital insufficiency of aortic valve: Secondary | ICD-10-CM

## 2017-04-08 DIAGNOSIS — D649 Anemia, unspecified: Secondary | ICD-10-CM | POA: Diagnosis not present

## 2017-04-08 DIAGNOSIS — I82603 Acute embolism and thrombosis of unspecified veins of upper extremity, bilateral: Secondary | ICD-10-CM

## 2017-04-08 LAB — POCT INR: INR: 2.3

## 2017-04-13 ENCOUNTER — Encounter: Payer: Self-pay | Admitting: Cardiology

## 2017-04-13 ENCOUNTER — Ambulatory Visit (INDEPENDENT_AMBULATORY_CARE_PROVIDER_SITE_OTHER): Payer: Medicare Other | Admitting: Cardiology

## 2017-04-13 VITALS — BP 104/58 | HR 60 | Wt 170.0 lb

## 2017-04-13 DIAGNOSIS — Q2381 Bicuspid aortic valve: Secondary | ICD-10-CM

## 2017-04-13 DIAGNOSIS — I219 Acute myocardial infarction, unspecified: Secondary | ICD-10-CM

## 2017-04-13 DIAGNOSIS — I4891 Unspecified atrial fibrillation: Secondary | ICD-10-CM | POA: Diagnosis not present

## 2017-04-13 DIAGNOSIS — Z952 Presence of prosthetic heart valve: Secondary | ICD-10-CM | POA: Diagnosis not present

## 2017-04-13 DIAGNOSIS — I10 Essential (primary) hypertension: Secondary | ICD-10-CM | POA: Diagnosis not present

## 2017-04-13 DIAGNOSIS — I513 Intracardiac thrombosis, not elsewhere classified: Secondary | ICD-10-CM | POA: Diagnosis not present

## 2017-04-13 DIAGNOSIS — Z9889 Other specified postprocedural states: Secondary | ICD-10-CM | POA: Diagnosis not present

## 2017-04-13 DIAGNOSIS — Q231 Congenital insufficiency of aortic valve: Secondary | ICD-10-CM

## 2017-04-13 DIAGNOSIS — I2510 Atherosclerotic heart disease of native coronary artery without angina pectoris: Secondary | ICD-10-CM

## 2017-04-13 DIAGNOSIS — I9789 Other postprocedural complications and disorders of the circulatory system, not elsewhere classified: Secondary | ICD-10-CM

## 2017-04-13 MED ORDER — AMIODARONE HCL 200 MG PO TABS: 200.0000 mg | ORAL_TABLET | Freq: Every day | ORAL | 3 refills | Status: DC

## 2017-04-13 NOTE — Patient Instructions (Addendum)
Medication Instructions:  INCREASE AMIODARONE TO 200 MG DAILY  Labwork: NONE  Testing/Procedures: NONE  Follow-Up: Your physician recommends that you schedule a follow-up appointment in: 3 MONTHS ( [place])   Any Other Special Instructions Will Be Listed Below (If Applicable).     If you need a refill on your cardiac medications before your next appointment, please call your pharmacy.

## 2017-04-13 NOTE — Progress Notes (Signed)
Clinical Summary Scott [name] is a 71 y.o.male seen today for follow up of the following medical problems.   1. Aortic stenosis - history of bicuspid AV - history of prior AVR,  [facility] Magna Ease Pericardial Tissue Valve (size 84m, model # 3300TFX, serial # [id number] along with 3 vessel CABG, MV repair, and repair of ascending aortic aneurysm    - OR on 10/22/16 for CABG X 3, AVR, MVR, repair of thoracic ascending aneurysm with placement of VAC on open chest Post op course significant for cardiogenic shock requiring multiple pressors, SAM requiring removal of mitral annuloplasty ring, persistent fevers, encephalopathy, shocked liver, DIC due to consumptive intraoperative coagulopathy, diffuse clotting with ischemia of distal BUE/BLE extremities, A fib, C diff colitis, volume overload and VDRF with difficulty with vent wean requiring tracheostomy. Sternal wound closed on 12/4 and was started on coumadin for RV thrombus and A fib.   [facility] MRSA bacteremia, pneumonitis, intermittent fevers, dysphagia requiring PEG tube placement on 1/15 by Dr. YKathlene Cote He developed progressive gangrenous changes of BUE and BLE requiring B-transtibial amputation , left transmetacarpal amputation and Right MCP amputation of right hand by Dr. DSharol Givenon 1/19.    - no recent SOB/DOE. No recent edema - compliant with meds   2. Mitral regurgitation - patient had MV ring placed during his surgery 10/22/17 along with AVR, ascending thoracic aneurysm repair, and CABG.  - ring was subsequently removed during that operation due to significant SAM - 02/15/17 echo shows only mild MR  - denies any SOB/DOE/LE edema  3. CAD - history of CABG 10/22/2017 (LIMA-Diag and distal LAD, SVG-LAD) 01/2017 echo: 55-60%, no WMAs, grade II diastolic dysfunction. Normal functioning AV.    - no recent chest pains/SOB/DOE  4. Ischemic limbs - occurred in setting of cardiogenic shock requiring high  dose pressors as well as DIC  - required amputation - followed by ortho. Appt with Dr DSharol Given   5.Post op Afib - has been on amio - no recent bleeding on coumadin.   - no recent palpitations.    6. RA thrombus - noted during duruing 10/26/16 TEE - repeat echo did not show evidence of RA thrombus - has been on coumadin     Past Medical History:  Diagnosis Date  . Arthritis    "hips, shoulders; knees; back" (10/20/2016)  . Bicuspid aortic valve   . BPH (benign prostatic hypertrophy)   . Carpal tunnel syndrome of right wrist   . Coronary artery disease involving native coronary artery of native heart with unstable angina pectoris ([facility] 10/20/2016  . Diverticulitis   . GERD (gastroesophageal reflux disease)   . Gout   . Heart murmur   . Hyperlipemia   . Hypertension   . Postoperative atrial fibrillation ([facility] 10/24/2016  . RLS (restless legs syndrome)   . S/P aortic valve replacement with bioprosthetic valve 10/22/2016   [facility] pericardial bi[facility]rosthetic tissue valve  . S/P ascending aortic aneurysm repair 10/22/2016   28 mm supracoronary straight graft replacement of ascending thoracic aortic aneurysm  . S/P CABG x 3 10/22/2016   Sequential LIMA to Diag and LAD, SVG to distal LAD, open vein harvest right thigh  . S/P mitral valve repair 10/22/2016   Artificial Gore-tex neochord placement x6 - posterior annuloplasty band placed but removed due to systolic anterior motion of mitral valve  . Severe aortic stenosis   . Snores    Never been tested for sleep  apnea  . Thoracic ascending aortic aneurysm ([place]) 10/21/2016  . Thrombocytopenia ([place]) 10/22/2016  . Type II diabetes mellitus (H[facility])      Allergies  Allergen Reactions  . Doxycycline Hives  . Penicillins Hives     Has patient had a PCN reaction causing immediate rash, facial/tongue/throat swelling, SOB or lightheadedness with hypotension: No Has patient had a PCN reaction causing severe rash  involving mucus membranes or skin necrosis: No Has patient had a PCN reaction that required hospitalization: No Has patient had a PCN reaction occurring within the last 10 years:# # # YES # # #  If all of the above answers are "NO", then may proceed with Cephalosporin use.   . Sulfa Antibiotics Hives     Current Outpatient Prescriptions  Medication Sig Dispense Refill  . amiodarone (PACERONE) 100 MG tablet Take 1 tablet (100 mg total) by mouth daily. 30 tablet 3  . aspirin 81 MG tablet Take 81 mg by mouth 2 (two) times daily.     Marland Kitchen atenolol (TENORMIN) 25 MG tablet Take 1 tablet (25 mg total) by mouth daily. 30 tablet 3  . atorvastatin (LIPITOR) 40 MG tablet Take 1 tablet (40 mg total) by mouth daily at 6 PM. 30 tablet 3  . blood glucose meter kit and supplies KIT Dispense based on patient and insurance preference. Use up to four times daily as directed. (FOR ICD-9 250.00, 250.01). 1 each 0  . digoxin (LANOXIN) 0.125 MG tablet Take 1 tablet (0.125 mg total) by mouth daily. 30 tablet 3  . famotidine (PEPCID) 20 MG tablet Take 1 tablet (20 mg total) by mouth 2 (two) times daily. 60 tablet 0  . ferrous sulfate 325 (65 FE) MG tablet Take 1 tablet (325 mg total) by mouth 2 (two) times daily with a meal. 60 tablet 0  . fluticasone (FLONASE) 50 MCG/ACT nasal spray Place 1 spray into both nostrils daily. (Patient taking differently: Place 1 spray into both nostrils daily as needed. ) 9.9 g 2  . furosemide (LASIX) 40 MG tablet Take 1 tablet (40 mg total) by mouth 2 (two) times daily. 60 tablet 3  . gabapentin (NEURONTIN) 400 MG capsule Take 1 capsule (400 mg total) by mouth 3 (three) times daily. 90 capsule 0  . Insulin Glargine (LANTUS) 100 UNIT/ML Solostar Pen Inject into the skin. Now taking 5 units one time a day    . Insulin Pen Needle (CAREFINE PEN NEEDLES) 32G X 5 MM MISC 1 application by Does not apply route 2 (two) times daily. 100 each 0  . lisinopril (PRINIVIL,ZESTRIL) 2.5 MG tablet Take 1  tablet (2.5 mg total) by mouth daily. 30 tablet 3  . Melatonin 10 MG CAPS Take 10 mg by mouth at bedtime.     . nitroGLYCERIN (NITROSTAT) 0.4 MG SL tablet Place 0.4 mg under the tongue every 5 (five) minutes as needed.     . potassium chloride SA (K-DUR,KLOR-CON) 20 MEQ tablet Take 1 tablet (20 mEq total) by mouth 2 (two) times daily. 60 tablet 3  . rOPINIRole (REQUIP) 2 MG tablet Take 2 mg by mouth See admin instructions. Takes 5m at 4:30pm and 274mat 9:30pm.    . sertraline (ZOLOFT) 50 MG tablet Take 1.5 tablets (75 mg total) by mouth daily. 45 tablet 0  . Turmeric 500 MG CAPS Take 500 mg by mouth 2 (two) times daily.     . vitamin C (VITAMIN C) 500 MG tablet Take 1 tablet (500 mg total) by  mouth 2 (two) times daily. 60 tablet 0  . warfarin (COUMADIN) 2 MG tablet Take 1 1/2 tablets daily except 1 tablet on Sundays 45 tablet 3  . zinc sulfate 220 (50 Zn) MG capsule Take 1 capsule (220 mg total) by mouth 2 (two) times daily. 60 capsule 0   No current facility-administered medications for this visit.      Past Surgical History:  Procedure Laterality Date  . AMPUTATION Bilateral 12/11/2016   Procedure: AMPUTATION BELOW KNEE BILATERALLY;  Surgeon: [name], MD;  Location: [place];  Service: Orthopedics;  Laterality: Bilateral;  . AMPUTATION Bilateral 12/11/2016   Procedure: AMPUTATION BILATERAL HANDS EXCEPT RIGHT THUMB;  Surgeon: [name], MD;  Location: [place];  Service: Orthopedics;  Laterality: Bilateral;  . AORTIC VALVE REPLACEMENT N/A 10/22/2016   Procedure: AORTIC VALVE REPLACEMENT (AVR) WITH SIZE 25 MM MAGNA EASE PERICARDIAL BIOPROSTHESIS - AORTIC;  Surgeon: Rexene Alberts, MD;  Location: [place];  Service: Open Heart Surgery;  Laterality: N/A;  . BONE EXCISION Right 02/01/2017   Procedure: EXCISION RIGHT INDEX METACARPAL HEAD;  Surgeon: [name], MD;  Location: [place];  Service: Orthopedics;  Laterality: Right;  . CARDIAC CATHETERIZATION N/A 10/20/2016   Procedure: Right/Left  Heart Cath and Coronary Angiography;  Surgeon: Leonie Man, MD;  Location: [facility];  Service: Cardiovascular;  Laterality: N/A;  . CARPAL TUNNEL RELEASE Right 11/28/2013   Procedure: RIGHT WRIST CARPAL TUNNEL RELEASE;  Surgeon: Lorn Junes, MD;  Location: [place];  Service: Orthopedics;  Laterality: Right;  . CATARACT EXTRACTION W/ INTRAOCULAR LENS  IMPLANT, BILATERAL Bilateral 1978  . COLONOSCOPY    . CORONARY ARTERY BYPASS GRAFT N/A 10/22/2016   Procedure: CORONARY ARTERY BYPASS GRAFTING (CABG)x 2 WITH LIMA TO DIAGONAL, OPEN  HARVESTING OF RIGHT SAPHENOUS VEIN FOR VEIN GRAFT TO LAD;  Surgeon: Rexene Alberts, MD;  Location: [place];  Service: Open Heart Surgery;  Laterality: N/A;  . FRACTURE SURGERY    . INGUINAL HERNIA REPAIR Right 1998  . IR GENERIC HISTORICAL  12/07/2016   IR US GUIDE VASC ACCESS RIGHT 12/07/2016 Aletta Edouard, MD [facility]  . IR GENERIC HISTORICAL  12/07/2016   IR RADIOLOGY PERIPHERAL GUIDED IV START 12/07/2016 Aletta Edouard, MD [facility]  . IR GENERIC HISTORICAL  12/07/2016   IR GASTROSTOMY TUBE MOD SED 12/07/2016 Aletta Edouard, MD [facility]  . LIPOMA EXCISION Right 2008   "side of my head"  . MITRAL VALVE REPAIR N/A 10/22/2016   Procedure: MITRAL VALVE REPAIR (MVR) WITH SIZE 30 SORIN ANNULOFLEX ANNULOPLASTY RING WITH SUBSEQUENT REMOVAL OF RING;  Surgeon: Rexene Alberts, MD;  Location: [place];  Service: Open Heart Surgery;  Laterality: N/A;  . PENECTOMY  2007   Peyronie's disease   . PENILE PROSTHESIS IMPLANT  2009  . REMOVAL OF PENILE PROSTHESIS N/A 02/01/2017   Procedure: REMOVAL OF PENILE PROSTHESIS;  Surgeon: Kathie Rhodes, MD;  Location: [place];  Service: Urology;  Laterality: N/A;  . SHOULDER OPEN ROTATOR CUFF REPAIR Right 2006  . STERNAL CLOSURE N/A 10/26/2016   Procedure: STERNAL WASHOUT AND DELAYED PRIMARY CLOSURE;  Surgeon: Rexene Alberts, MD;  Location: [place];  Service: Thoracic;  Laterality: N/A;  . TEE WITHOUT  CARDIOVERSION N/A 10/26/2016   Procedure: TRANSESOPHAGEAL ECHOCARDIOGRAM (TEE);  Surgeon: Rexene Alberts, MD;  Location: [place];  Service: Thoracic;  Laterality: N/A;  . TEE WITHOUT CARDIOVERSION N/A 10/22/2016   Procedure: TRANSESOPHAGEAL ECHOCARDIOGRAM (TEE);  Surgeon: Rexene Alberts, MD;  Location: [place];  Service: Open Heart Surgery;  Laterality: N/A;  . THORACIC AORTIC ANEURYSM REPAIR  10/22/2016   Procedure: ASCENDING AORTIC  ANEURYSM REPAIR (AAA) WITH 28 MM HEMASHIELD PLATINUM WOVEN DOUBLE VELOUR VASCULAR GRAFT;  Surgeon: Rexene Alberts, MD;  Location: [place];  Service: Open Heart Surgery;;  . TONSILLECTOMY  ~ 1955  . TRACHEOSTOMY TUBE PLACEMENT N/A 11/09/2016   Procedure: TRACHEOSTOMY;  Surgeon: Rexene Alberts, MD;  Location: [place];  Service: Thoracic;  Laterality: N/A;  . TRANSURETHRAL RESECTION OF PROSTATE  2005  . TRIGGER FINGER RELEASE Bilateral    several lt and rt hands  . TRIGGER FINGER RELEASE Right 11/28/2013   Procedure: RIGHT TRIGGER FINGER  RELEASE (TENDON SHEATH INCISION);  Surgeon: Lorn Junes, MD;  Location: [place];  Service: Orthopedics;  Laterality: Right;  Marland Kitchen VIDEO BRONCHOSCOPY N/A 11/09/2016   Procedure: VIDEO BRONCHOSCOPY;  Surgeon: Rexene Alberts, MD;  Location: [place];  Service: Thoracic;  Laterality: N/A;  . WRIST FRACTURE SURGERY Right ~ 1610     Allergies  Allergen Reactions  . Doxycycline Hives  . Penicillins Hives     Has patient had a PCN reaction causing immediate rash, facial/tongue/throat swelling, SOB or lightheadedness with hypotension: No Has patient had a PCN reaction causing severe rash involving mucus membranes or skin necrosis: No Has patient had a PCN reaction that required hospitalization: No Has patient had a PCN reaction occurring within the last 10 years:# # # YES # # #  If all of the above answers are "NO", then may proceed with Cephalosporin use.   . Sulfa Antibiotics Hives      Family History  Problem  Relation Age of Onset  . Lung cancer Mother   . Clotting disorder Father        No details  . Heart disease Sister 68       Stents  . Cancer Sister        Throat     Social History Scott [name] reports that he quit smoking about 34 years ago. His smoking use included Pipe and Cigars. He quit after 3.00 years of use. He has never used smokeless tobacco. Scott [name] reports that he drinks about 6.0 oz of alcohol per week .   Review of Systems CONSTITUTIONAL: No weight loss, fever, chills, weakness or fatigue.  HEENT: Eyes: No visual loss, blurred vision, double vision or yellow sclerae.No hearing loss, sneezing, congestion, runny nose or sore throat.  SKIN: No rash or itching.  CARDIOVASCULAR: per hpi RESPIRATORY: No shortness of breath, cough or sputum.  GASTROINTESTINAL: No anorexia, nausea, vomiting or diarrhea. No abdominal pain or blood.  GENITOURINARY: No burning on urination, no polyuria NEUROLOGICAL: No headache, dizziness, syncope, paralysis, ataxia, numbness or tingling in the extremities. No change in bowel or bladder control.  MUSCULOSKELETAL: No muscle, back pain, joint pain or stiffness.  LYMPHATICS: No enlarged nodes. No history of splenectomy.  PSYCHIATRIC: No history of depression or anxiety.  ENDOCRINOLOGIC: No reports of sweating, cold or heat intolerance. No polyuria or polydipsia.  Marland Kitchen   Physical Examination Vitals:   04/13/17 1125  BP: (!) 104/58  Pulse: 60   Vitals:   04/13/17 1125  Weight: 170 lb (77.1 kg)    Gen: resting comfortably, no acute distress HEENT: no scleral icterus, pupils equal round and reactive, no palptable cervical adenopathy,  CV: RRR, 2/6 systolic murmur at apex,  no jvd Resp: Clear to  auscultation bilaterally GI: abdomen is soft, non-tender, non-distended, normal bowel sounds, no hepatosplenomegaly MSK: extremities are warm, no edema.  Skin: warm, no rash Neuro:  no focal deficits Psych: appropriate affect    Assessment and  Plan   1. Aortic stenosis - s/p tissue AVR, recent echo shows normal function.  - denies any recent symptoms - we will continue to monitor.   2. Mitral regurgitation - MV ring placed and removed during initial surgery due to [facility] - most recent ehco shows mild MR. Likely improved due to decreased pressure across AV valve - no symptoms, continue to monitor  3. CAD - s/p CABG. No recent chest pani - he will continue current meds  4. Postoperative afib - no recent palpitations.  - EKG in clinic today shows he is in [place].  - we will increase amio to 270m daily, continue coumadin at this time   5. RA thrombus - occurred post cardiac surgery. Has been on coumadin -  Most recent echo without evidence of ongoing clot - continue coumadin in setting of afib/aflutter, has completed treatment for RA thrombus  6. Ischemic limbs - s/p multiple amputations, occurred postop in setting of DIC and pressor use - continue to follow with ortho    F/u 3 months   JArnoldo Lenis M.D.

## 2017-04-15 ENCOUNTER — Other Ambulatory Visit: Payer: Self-pay | Admitting: Cardiology

## 2017-04-15 ENCOUNTER — Telehealth: Payer: Self-pay | Admitting: Cardiology

## 2017-04-15 ENCOUNTER — Ambulatory Visit (INDEPENDENT_AMBULATORY_CARE_PROVIDER_SITE_OTHER): Payer: Self-pay | Admitting: Pharmacist

## 2017-04-15 DIAGNOSIS — I4891 Unspecified atrial fibrillation: Secondary | ICD-10-CM

## 2017-04-15 DIAGNOSIS — Z9889 Other specified postprocedural states: Secondary | ICD-10-CM

## 2017-04-15 DIAGNOSIS — D649 Anemia, unspecified: Secondary | ICD-10-CM | POA: Diagnosis not present

## 2017-04-15 DIAGNOSIS — Q231 Congenital insufficiency of aortic valve: Secondary | ICD-10-CM

## 2017-04-15 DIAGNOSIS — T8189XD Other complications of procedures, not elsewhere classified, subsequent encounter: Secondary | ICD-10-CM | POA: Diagnosis not present

## 2017-04-15 DIAGNOSIS — I82603 Acute embolism and thrombosis of unspecified veins of upper extremity, bilateral: Secondary | ICD-10-CM

## 2017-04-15 DIAGNOSIS — T8781 Dehiscence of amputation stump: Secondary | ICD-10-CM | POA: Diagnosis not present

## 2017-04-15 DIAGNOSIS — E1151 Type 2 diabetes mellitus with diabetic peripheral angiopathy without gangrene: Secondary | ICD-10-CM | POA: Diagnosis not present

## 2017-04-15 DIAGNOSIS — I9789 Other postprocedural complications and disorders of the circulatory system, not elsewhere classified: Secondary | ICD-10-CM

## 2017-04-15 DIAGNOSIS — I2510 Atherosclerotic heart disease of native coronary artery without angina pectoris: Secondary | ICD-10-CM | POA: Diagnosis not present

## 2017-04-15 LAB — POCT INR: INR: 2.2

## 2017-04-15 NOTE — Telephone Encounter (Signed)
See anticoag note from 04/15/17 for details. Will route to MD for recs on ABX with dental cleanings.

## 2017-04-15 NOTE — Telephone Encounter (Signed)
Patient's INR today was 2.2  He will be discharged from [facility] care today and will need to follow up in coumadin clinic from now on  Asked if patient will need to be on and antibiotic before he has his teeth cleaned.

## 2017-04-16 NOTE — Telephone Encounter (Signed)
Yes, he needs abx prior to dental procedures and cleanings. Typically amoxicillin 2g one dose about 30-74min prior to visit.

## 2017-04-16 NOTE — Telephone Encounter (Signed)
Spoke with patient and made aware of prophylaxis recommendations. He states his next cleaning is not scheduled until after his next visit with Dr. Harl Bowie. Advised we could fill abx at that appt. Advised he call if anything is scheduled before that appt. He states understanding and appreciation for call.

## 2017-04-20 ENCOUNTER — Encounter: Payer: Self-pay | Admitting: Physical Therapy

## 2017-04-20 ENCOUNTER — Ambulatory Visit: Payer: Medicare Other | Attending: Orthopedic Surgery | Admitting: Physical Therapy

## 2017-04-20 DIAGNOSIS — M6281 Muscle weakness (generalized): Secondary | ICD-10-CM | POA: Diagnosis not present

## 2017-04-20 DIAGNOSIS — R293 Abnormal posture: Secondary | ICD-10-CM | POA: Diagnosis not present

## 2017-04-20 DIAGNOSIS — R2689 Other abnormalities of gait and mobility: Secondary | ICD-10-CM | POA: Diagnosis not present

## 2017-04-20 DIAGNOSIS — R2681 Unsteadiness on feet: Secondary | ICD-10-CM | POA: Diagnosis not present

## 2017-04-20 NOTE — Therapy (Signed)
[facility] [address] [place], [place], [address] Phone: [phone]   Fax:  [phone]  Physical Therapy Evaluation  Patient Details  Name: Scott [name] MRN: [id number] Date of Birth: 03-02-1946 Referring Provider: Meridee Score MD   Encounter Date: 04/20/2017      PT End of Session - 04/20/17 1156    Visit Number 1   Number of Visits 33   Date for PT Re-Evaluation 06/18/17   Authorization Type Medicare & G-codes every 10th visit    PT Start Time 1015   PT Stop Time 1106   PT Time Calculation (min) 51 min   Equipment Utilized During Treatment Gait belt   Activity Tolerance Patient tolerated treatment well   Behavior During Therapy [facility] for tasks assessed/performed      Past Medical History:  Diagnosis Date  . Arthritis    "hips, shoulders; knees; back" (10/20/2016)  . Bicuspid aortic valve   . BPH (benign prostatic hypertrophy)   . Carpal tunnel syndrome of right wrist   . Coronary artery disease involving native coronary artery of native heart with unstable angina pectoris ([place]) 10/20/2016  . Diverticulitis   . GERD (gastroesophageal reflux disease)   . Gout   . Heart murmur   . Hyperlipemia   . Hypertension   . Postoperative atrial fibrillation ([place]) 10/24/2016  . RLS (restless legs syndrome)   . S/P aortic valve replacement with bioprosthetic valve 10/22/2016   25 mm [facility] Ease bovine pericardial bioprosthetic tissue valve  . S/P ascending aortic aneurysm repair 10/22/2016   28 mm supracoronary straight graft replacement of ascending thoracic aortic aneurysm  . S/P CABG x 3 10/22/2016   Sequential LIMA to Diag and LAD, SVG to distal LAD, open vein harvest right thigh  . S/P mitral valve repair 10/22/2016   Artificial Gore-tex neochord placement x6 - posterior annuloplasty band placed but removed due to systolic anterior motion of mitral valve  . Severe aortic stenosis   . Snores     Never been tested for sleep apnea  . Thoracic ascending aortic aneurysm ([place]) 10/21/2016  . Thrombocytopenia ([place]) 10/22/2016  . Type II diabetes mellitus ([place])     Past Surgical History:  Procedure Laterality Date  . AMPUTATION Bilateral 12/11/2016   Procedure: AMPUTATION BELOW KNEE BILATERALLY;  Surgeon: [name], MD;  Location: [facility];  Service: Orthopedics;  Laterality: Bilateral;  . AMPUTATION Bilateral 12/11/2016   Procedure: AMPUTATION BILATERAL HANDS EXCEPT RIGHT THUMB;  Surgeon: [name], MD;  Location: [place];  Service: Orthopedics;  Laterality: Bilateral;  . AORTIC VALVE REPLACEMENT N/A 10/22/2016   Procedure: AORTIC VALVE REPLACEMENT (AVR) WITH SIZE 25 MM MAGNA EASE PERICARDIAL BIOPROSTHESIS - AORTIC;  Surgeon: Rexene Alberts, MD;  Location: [facility];  Service: Open Heart Surgery;  Laterality: N/A;  . BONE EXCISION Right 02/01/2017   Procedure: EXCISION RIGHT INDEX METACARPAL HEAD;  Surgeon: [name], MD;  Location: [facility];  Service: Orthopedics;  Laterality: Right;  . CARDIAC CATHETERIZATION N/A 10/20/2016   Procedure: Right/Left Heart Cath and Coronary Angiography;  Surgeon: Leonie Man, MD;  Location: [facility];  Service: Cardiovascular;  Laterality: N/A;  . CARPAL TUNNEL RELEASE Right 11/28/2013   Procedure: RIGHT WRIST CARPAL TUNNEL RELEASE;  Surgeon: Lorn Junes, MD;  Location: [facility];  Service: Orthopedics;  Laterality: Right;  . CATARACT EXTRACTION W/ INTRAOCULAR LENS  IMPLANT, BILATERAL Bilateral 1978  . COLONOSCOPY    .  CORONARY ARTERY BYPASS GRAFT N/A 10/22/2016   Procedure: CORONARY ARTERY BYPASS GRAFTING (CABG)x 2 WITH LIMA TO DIAGONAL, OPEN  HARVESTING OF RIGHT SAPHENOUS VEIN FOR VEIN GRAFT TO LAD;  Surgeon: Rexene Alberts, MD;  Location: [facility];  Service: Open Heart Surgery;  Laterality: N/A;  . FRACTURE SURGERY    . INGUINAL HERNIA REPAIR Right 1998  . IR GENERIC HISTORICAL  12/07/2016   IR US GUIDE VASC ACCESS RIGHT  12/07/2016 Aletta Edouard, MD [facility]  . IR GENERIC HISTORICAL  12/07/2016   IR RADIOLOGY PERIPHERAL GUIDED IV START 12/07/2016 Aletta Edouard, MD [facility]  . IR GENERIC HISTORICAL  12/07/2016   IR GASTROSTOMY TUBE MOD SED 12/07/2016 Aletta Edouard, MD [facility]  . LIPOMA EXCISION Right 2008   "side of my head"  . MITRAL VALVE REPAIR N/A 10/22/2016   Procedure: MITRAL VALVE REPAIR (MVR) WITH SIZE 30 SORIN ANNULOFLEX ANNULOPLASTY RING WITH SUBSEQUENT REMOVAL OF RING;  Surgeon: Rexene Alberts, MD;  Location: [facility];  Service: Open Heart Surgery;  Laterality: N/A;  . PENECTOMY  2007   Peyronie's disease   . PENILE PROSTHESIS IMPLANT  2009  . REMOVAL OF PENILE PROSTHESIS N/A 02/01/2017   Procedure: REMOVAL OF PENILE PROSTHESIS;  Surgeon: Kathie Rhodes, MD;  Location: [facility];  Service: Urology;  Laterality: N/A;  . SHOULDER OPEN ROTATOR CUFF REPAIR Right 2006  . STERNAL CLOSURE N/A 10/26/2016   Procedure: STERNAL WASHOUT AND DELAYED PRIMARY CLOSURE;  Surgeon: Rexene Alberts, MD;  Location: [facility];  Service: Thoracic;  Laterality: N/A;  . TEE WITHOUT CARDIOVERSION N/A 10/26/2016   Procedure: TRANSESOPHAGEAL ECHOCARDIOGRAM (TEE);  Surgeon: Rexene Alberts, MD;  Location: [facility];  Service: Thoracic;  Laterality: N/A;  . TEE WITHOUT CARDIOVERSION N/A 10/22/2016   Procedure: TRANSESOPHAGEAL ECHOCARDIOGRAM (TEE);  Surgeon: Rexene Alberts, MD;  Location: [facility];  Service: Open Heart Surgery;  Laterality: N/A;  . THORACIC AORTIC ANEURYSM REPAIR  10/22/2016   Procedure: ASCENDING AORTIC  ANEURYSM REPAIR (AAA) WITH 28 MM HEMASHIELD PLATINUM WOVEN DOUBLE VELOUR VASCULAR GRAFT;  Surgeon: Rexene Alberts, MD;  Location: [facility];  Service: Open Heart Surgery;;  . TONSILLECTOMY  ~ 1955  . TRACHEOSTOMY TUBE PLACEMENT N/A 11/09/2016   Procedure: TRACHEOSTOMY;  Surgeon: Rexene Alberts, MD;  Location: [facility];  Service: Thoracic;  Laterality: N/A;  . TRANSURETHRAL RESECTION OF PROSTATE  2005  . TRIGGER FINGER  RELEASE Bilateral    several lt and rt hands  . TRIGGER FINGER RELEASE Right 11/28/2013   Procedure: RIGHT TRIGGER FINGER  RELEASE (TENDON SHEATH INCISION);  Surgeon: Lorn Junes, MD;  Location: [facility];  Service: Orthopedics;  Laterality: Right;  Marland Kitchen VIDEO BRONCHOSCOPY N/A 11/09/2016   Procedure: VIDEO BRONCHOSCOPY;  Surgeon: Rexene Alberts, MD;  Location: [facility];  Service: Thoracic;  Laterality: N/A;  . WRIST FRACTURE SURGERY Right ~ 1959    There were no vitals filed for this visit.       Subjective Assessment - 04/20/17 1015    Subjective Patient is a 71 year old male presenting to [facility] in a manual wheelchair with bilateral prostheses in lap s/p bilateral transtibial amputations, L partial (all phalanges) hand amputation, and R partial (2nd-5th phalanges) hand amputation except for R thumb. All amputations were performed on 12/11/2016 due to gangrene, which developed following cardiac surgery (10/20/2016). Prior to November 2017, patient was independent. Patient received his prostheses on 04/14/2017, and reports he is wearing prosthesis 1hr 2x/day.  Patient is accompained by: Family member  wife, [name] & dtr, [name]   Pertinent History arthritis, bicuspid aortic valve, BPH, carpal tunnel syndrome in R wrist, CAD, diverticulitis, GERD, gout, hyperlipidemia, HTN, DM type II,thrombocytopenia, thoracic ascending aortic aneurysm, severe aortic stenosis, amputation of bilateral hands except right thumb (12/11/2016) due to gangrene, bilateral transtibial amputations (12/11/2016) due to gangrene, R and L heart cath (10/20/2016)    Limitations Standing;Walking;House hold activities   Patient Stated Goals use prostheses to walk including stairs, outdoors, target shooting, drive   Currently in Pain? No/denies            [facility] PT Assessment - 04/20/17 1015      Assessment   Medical Diagnosis Bilateral transtibial amputations, L partial hand amputation, R partial hand amputation  except for R thumb   Referring Provider Meridee Score MD    Onset Date/Surgical Date 04/14/17  Prostheses delivery   Hand Dominance Right     Precautions   Precautions Fall     Balance Screen   Has the patient fallen in the past 6 months No   Has the patient had a decrease in activity level because of a fear of falling?  No   Is the patient reluctant to leave their home because of a fear of falling?  No     Home Environment   Living Environment Private residence   Living Arrangements Spouse/significant other   Type of [place] entrance;Stairs to enter   Entrance Stairs-Number of Steps 2   Entrance Stairs-Rails None   Home Layout Two level;1/2 bath on main level;Able to live on main level with bedroom/bathroom   Alternate Level Stairs-Number of Steps 14   Alternate Level Stairs-Rails Right   Home Equipment Walker - 2 wheels;Bedside commode;Wheelchair - manual;Hospital bed   Additional Comments basement work area     Prior Function   Level of Independence Independent;Independent with household mobility without device;Independent with community mobility without device   Vocation Retired     Observation/Other Assessments   Focus on Therapeutic Outcomes (FOTO)  20.55 Functional Status   Activities of Balance Confidence Scale (ABC Scale)  5.0%   Fear Avoidance Belief Questionnaire (FABQ)  45 (12)     Posture/Postural Control   Posture/Postural Control Postural limitations   Postural Limitations Rounded Shoulders;Forward head;Flexed trunk     ROM / Strength   AROM / PROM / Strength PROM;Strength     PROM   Overall PROM  Within functional limits for tasks performed   Overall PROM Comments Gross, normal PROM in bilateral shoulder flexion/abduction and elbow flexion/extension. Noted slight decrease in L wrist flexion/extension relative to R wrist's flexion/extension. Gross, normal hip flexion, and knee flexion/extension. All PROM measurements taken from seated  position in manual wheelchair.     Strength   Overall Strength Deficits   Overall Strength Comments Gross, normal strength in bilateral shoulder flexion/abduction, elbow flexion/extension, hip flexion/extension/abduction/adduction, knee flexion/extension. All gross strength testing performed from seated position in manual wheelchair. Noted decrease in bilateral LE's functional strength during ambulation.     Transfers   Transfers Sit to Stand;Stand to Sit;Squat Pivot Transfers   Sit to Stand 3: Mod assist;With upper extremity assist;With armrests;From chair/3-in-1  to RW with platform    Sit to Stand Details Verbal cues for sequencing;Verbal cues for technique;Verbal cues for precautions/safety   Stand to Sit 3: Mod assist;With upper extremity assist;With armrests;To chair/3-in-1  to RW with platform    Stand to Sit  Details (indicate cue type and reason) Verbal cues for sequencing;Verbal cues for technique;Verbal cues for precautions/safety   Squat Pivot Transfers 4: Min guard;With upper extremity assistance  armrest removed & with B protheses      Ambulation/Gait   Ambulation/Gait Yes   Ambulation/Gait Assistance 3: Mod assist  2 person for safety   Ambulation/Gait Assistance Details required seated rest break due to a decrease in functional LE strength during ambulation   Ambulation Distance (Feet) 125 Feet   Assistive device Bilateral platform walker;Prostheses   Gait Pattern Step-to pattern;Decreased step length - right;Decreased step length - left;Decreased stride length;Right flexed knee in stance;Left flexed knee in stance;Trunk flexed;Narrow base of support   Ambulation Surface Level;Indoor     Balance   Balance Assessed Yes     Static Standing Balance   Static Standing - Balance Support Bilateral upper extremity supported   Static Standing - Level of Assistance Other (comment);5: Stand by assistance  CLOSE stand by assistance    Static Standing - Comment/# of Minutes  Performed 30s static standing with BUE support on RW. Performed 10s static standing with eyes closed with BUE support on RW.     Dynamic Standing Balance   Dynamic Standing - Balance Support Bilateral upper extremity supported  RW support   Dynamic Standing - Level of Assistance 4: Min assist   Dynamic Standing - Balance Activities Eyes opn;Head turns   Dynamic Standing - Comments Turns head to scan environment right & left to side only, up & down with cervical movement, minimal trunk movement;  reaches 2" anteriorly with single UE support.          Prosthetics Assessment - 04/20/17 [place] with Skin check;Residual limb care;Prosthetic cleaning;Correct ply sock adjustment;Proper wear schedule/adjustment;Proper weight-bearing schedule/adjustment;Ply sock cleaning   Prosthetic Care Comments  Patient reports he received prostheses on 04/14/2017 and has been wearing prostheses every day for 1hr 2x/day. PT educated patient to increase wear time to 1hr 3-4x/day.   Donning prosthesis  +1 Total assist   Doffing prosthesis  Max assist   Current prosthetic wear tolerance (days/week)  daily for 6 days since delivery   Current prosthetic wear tolerance (#hours/day)  1 hr, 2x/d; PT instructed to increase to 1 hr 3-4 times /day   Current prosthetic weight-bearing tolerance (hours/day)  5 minutes standing with RW support with BLE pain & back pain 5/10   Edema non-pitting    Residual limb condition  R residual limb: 5 wounds with scabs noted along incision. 1 scabbed wound on tibial crest. L residual limb: 3 scabbed wounds along incision, 1 scabbed wound on tibial crest, 1 scabbed wound on patella. Bil. TTA: good hair growth, temperature & color. Following standing & 129ft ambulation, drainage and blood noted on L residual limb at distal tibia when doffing liner. PT used tweezers to remove part of undissolved suture & applied Tegaderm.   K code/activity level with  prosthetic use  K3 Full community with variable cadence. Bil. prostheses are silicon liners with shuttle pin lock suspension, Total Contact socket design & dynamic response feet           Objective measurements completed on examination: See above findings.          [facility] Adult PT Treatment/Exercise - 04/20/17 1015      Prosthetics   Education Provided Skin check;Residual limb care;Prosthetic cleaning;Correct ply sock adjustment;Proper Donning;Proper Doffing;Proper wear schedule/adjustment;Proper weight-bearing schedule/adjustment   Person(s) Educated  Patient;Spouse;Child(ren)   Education Method Explanation;Demonstration;Tactile cues;Verbal cues   Education Method Verbalized understanding;Returned demonstration;Tactile cues required;Verbal cues required;Needs further instruction                PT Education - 04/20/17 1155    Education provided Yes   Education Details plan of care    Person(s) Educated Patient;Spouse;Child(ren)  wife - Danton Clap; daughter - [name]    Methods Explanation   Comprehension Verbalized understanding          PT Short Term Goals - 04/20/17 1235      PT SHORT TERM GOAL #1   Title Patient and patient's caregiver (due to patient's bilateral hand amputations) will verbalize understanding of proper cleaning, donning, and doffing of prostheses. (TARGET DATE: 05/21/2017)    Time 4   Period Weeks   Status New     PT SHORT TERM GOAL #2   Title Patient will tolerate wear 7 days/week for 6hrs total per day without signs of decline in skin integrity issues. (TARGET DATE: 05/21/2017)    Time 4   Period Weeks   Status New     PT SHORT TERM GOAL #3   Title Patient will demonstrate static standing balance for 2 minutes and stationery dynamic of head turns to scan with prostheses and unilateral UE support and supervision to indicate an increase in balance and a decrease in falls risk. (TARGET DATE: 05/21/2017)    Time 4   Period Weeks   Status New      PT SHORT TERM GOAL #4   Title Patient will demonstrate ability to reach anteriorly 5 inches and to knee level towards the ground with unilateral UE support without a loss of balance to indicate a decrease in his risk of falling. (TARGET DATE: 05/21/2017)    Time 4   Period Weeks   Status New     PT SHORT TERM GOAL #5   Title Patient will ambulate 200 feet with min guard with bilateral platform RW to indicate a decrease in his risk of falling. (TARGET DATE: 05/21/2017)    Time 4   Period Weeks   Status New           PT Long Term Goals - 04/20/17 1228      PT LONG TERM GOAL #1   Title Patient modified independently donnes & doffes, adjusts ply socks and pt / wife verbalize understanding of proper prosthetic care to ensure safe use of prostheses.  (Target Date: 08/20/2017)   Time 4   Period Months   Status New     PT LONG TERM GOAL #2   Title Patient will tolerate prostheses wear >90% of awake hours without skin issues or limb pain to demonstrate a decrease in his risk of falling. (Target Date: 08/20/2017)   Time 4   Period Months   Status New     PT LONG TERM GOAL #3   Title Patient performs standing balance activities with intermittent UE support reaching 10", picking up objects from floor and looks over shoulders with weight shift, trunk rotation with prostheses independently. (Target Date: 08/20/2017)     Time 4   Period Weeks   Status New     PT LONG TERM GOAL #4   Title Patient will ambulate 1000 feet including outdoor surfaces with LRAD and prostheses modified independently to enable community mobility.  (Target Date: 08/20/2017)   Time 4   Period Months   Status New     PT LONG TERM GOAL #  5   Title Patient will negotiate ramp/curbs and stairs with LRAD and prostheses modified independent for community access. (Target Date: 08/20/2017)   Time 4   Period Months   Status New     Additional Long Term Goals   Additional Long Term Goals Yes     PT LONG TERM GOAL #6    Title Patient's gait velocity will be >/= 1.59ft/s to indicate a limited community ambulator. (Target Date: 08/20/2017)   Time 4   Period Weeks   Status New     PT LONG TERM GOAL #7   Title Patient reports Activities of Balance Confindence score using FOTO >25% to indicate greater confidence in his balance. (Target Date: 08/20/2017)   Time 4   Period Months   Status New                Plan - 04/20/17 1213    Clinical Impression Statement Patient is a 71 year old male presenting to OPPT neuro with an evolving clinical presentation for a moderate complexity PT evaluation due to bilateral transtibial amputations, L partial hand amputation, and R partial hand amputation except for R thumb due to gangrene, which developed following cardiac surgery (10/20/2016). The following deficits were noted during the patient's exam: decreased balance with dependency on BUE support for static stance & MinA for simple stationery dynamic balance activities placing him at an increased risk for falling & dependency in ADLs;, dependence in all aspects of prosthetic care including total assist to donne/doffe placing him at an increased risk for skin issues and falls, limited prosthetic wear limiting standing & gait during his day; altered gait mechanics & dependency on BUE support on RW placing him at an increased risk of falling, dependency on caregivers and a sliding board in all transfers without his prostheses placing him at an increased risk of falling. Patient is dependent for squat-pivot (MinA) with armrests removed & ModA sit to/from stand with RW for stabilization. The patient's reliance on bilateral UE support to perform static standing balance indicates he is at a higher risk for repeated falls. Patient would benefit from skilled PT to address these impairments and functional limitations to maximize functional mobility independence and reduce falls risk.    History and Personal Factors relevant to plan of  care: arthritis, bicuspid aortic valve, BPH, carpal tunnel syndrome in R wrist, CAD, diverticulitis, GERD, gout, hyperlipidemia, HTN, DM type II,thrombocytopenia, thoracic ascending aortic aneurysm, severe aortic stenosis, amputation of bilateral partial hands except right thumb (12/11/2016) due to gangrene, bilateral transtibial amputations (12/11/2016) due to gangrene, R and L heart cath (10/20/2016)    Clinical Presentation Evolving   Clinical Presentation due to: wounds on bilateral residual limbs    Clinical Decision Making Moderate   Rehab Potential Good   Clinical Impairments Affecting Rehab Potential arthritis, bicuspid aortic valve, BPH, carpal tunnel syndrome in R wrist, CAD, diverticulitis, GERD, gout, hyperlipidemia, HTN, DM type II,thrombocytopenia, thoracic ascending aortic aneurysm, severe aortic stenosis, amputation of bilateral hands except right thumb (12/11/2016) due to gangrene, bilateral transtibial amputations (12/11/2016) due to gangrene, R and L heart cath (10/20/2016)    PT Frequency 2x / week   PT Duration Other (comment)  16 weeks   PT Treatment/Interventions ADLs/Self Care Home Management;Neuromuscular re-education;Balance training;Therapeutic exercise;Therapeutic activities;Functional mobility training;Stair training;Gait training;DME Instruction;Patient/family education;Prosthetic Training   PT Next Visit Plan initiate HEP standing at sink; continue prosthetic wear/care education; OT screen to determine if right hand splint can eliminate right platform on RW  Recommended Other Services OT screen for possible right hand splint for RW grip; Pt to recieve UE prostheses in 1-2 months & will need OT referral at that time.    Consulted and Agree with Plan of Care Patient;Family member/caregiver   Family Member Consulted Wife - [name]; Daughter - [name]       Patient will benefit from skilled therapeutic intervention in order to improve the following deficits and impairments:   Abnormal gait, Decreased activity tolerance, Decreased balance, Decreased coordination, Decreased range of motion, Decreased mobility, Decreased knowledge of use of DME, Decreased knowledge of precautions, Decreased endurance, Decreased skin integrity, Decreased scar mobility, Decreased strength, Difficulty walking, Postural dysfunction, Prosthetic Dependency  Visit Diagnosis: Unsteadiness on feet  Other abnormalities of gait and mobility  Abnormal posture  Muscle weakness (generalized)      G-Codes - 05/18/17 1242    Functional Assessment Tool Used (Outpatient Only) Patient is dependent in all prosthetic wear and care. Total assist to donne & doffe prostheses. Tolerates daily (6days since delivery) 1 hr 2x/day.    Functional Limitation Self care   Mobility: Walking and Moving Around Current Status [phone]) At least 80 percent but less than 100 percent impaired, limited or restricted   Mobility: Walking and Moving Around Goal Status [phone]) At least 1 percent but less than 20 percent impaired, limited or restricted       Problem List Patient Active Problem List   Diagnosis Date Noted  . Amputation of both hands with complication, subsequent encounter 03/01/2017  . Diabetes ([place]) 02/19/2017  . Status post bilateral below knee amputation ([place]) 02/09/2017  . Wound disruption, post-op, skin, sequela 02/09/2017  . Debility 02/05/2017  . Ganglion upper arm, right   . Thrombosis of both upper extremities   . Suspected heparin induced thrombocytopenia (HIT) in hospitalized patient ([place])   . Gangrene of lower extremity ([place])   . Heparin induced thrombocytopenia (HIT) ([place])   . Tracheostomy status ([place])   . Chest tube in place   . Atherosclerosis of native arteries of extremities with gangrene, left leg ([place])   . Atherosclerosis of native arteries of extremities with gangrene, right leg ([place])   . Acute on chronic diastolic CHF (congestive heart failure) ([place])   . Enteritis due to  Clostridium difficile   . Anasarca   . FUO (fever of unknown origin)   . Acute encephalopathy   . Elevated LFTs   . DIC (disseminated intravascular coagulation) ([place]) 10/28/2016  . Postoperative atrial fibrillation ([place]) 10/24/2016  . Cardiogenic shock ([place])   . Mitral regurgitation due to cusp prolapse 10/22/2016  . S/P aortic valve replacement with bioprosthetic valve 10/22/2016  . S/P ascending aortic aneurysm repair 10/22/2016  . S/P mitral valve repair 10/22/2016  . S/P CABG x 3 10/22/2016  . Thrombocytopenia ([place]) 10/22/2016  . Acute combined systolic and diastolic congestive heart failure ([place]) 10/22/2016  . Acute respiratory failure ([place]) 10/22/2016  . Thoracic ascending aortic aneurysm ([place]) 10/21/2016  . Coronary artery disease involving native coronary artery of native heart with unstable angina pectoris ([place]) 10/20/2016  . Severe aortic stenosis by prior echocardiography 10/19/2016  . Unstable angina ([place]) 10/19/2016  . Aortic valve stenosis 10/19/2016  . Bicuspid aortic valve 10/19/2016  . Trigger ring finger of right hand   . Wears glasses   . Gout   . Hypertension   . Carpal tunnel syndrome of right wrist   . Arthritis   . Snores    [place], SPT May 18, 2017, 12:58PM  Jamey Reas, PT, DPT  04/21/2017, 9:39 AM  [facility] [address] [place], [place], [address] Phone: [phone]   Fax:  [phone]  Name: FED CECI MRN: [id number] Date of Birth: [birth year]

## 2017-04-22 ENCOUNTER — Ambulatory Visit (INDEPENDENT_AMBULATORY_CARE_PROVIDER_SITE_OTHER): Payer: Medicare Other | Admitting: *Deleted

## 2017-04-22 DIAGNOSIS — I2510 Atherosclerotic heart disease of native coronary artery without angina pectoris: Secondary | ICD-10-CM

## 2017-04-22 DIAGNOSIS — I4891 Unspecified atrial fibrillation: Secondary | ICD-10-CM

## 2017-04-22 DIAGNOSIS — Q231 Congenital insufficiency of aortic valve: Secondary | ICD-10-CM

## 2017-04-22 DIAGNOSIS — I82603 Acute embolism and thrombosis of unspecified veins of upper extremity, bilateral: Secondary | ICD-10-CM

## 2017-04-22 DIAGNOSIS — I9789 Other postprocedural complications and disorders of the circulatory system, not elsewhere classified: Secondary | ICD-10-CM | POA: Diagnosis not present

## 2017-04-22 DIAGNOSIS — Z9889 Other specified postprocedural states: Secondary | ICD-10-CM

## 2017-04-22 DIAGNOSIS — Q2381 Bicuspid aortic valve: Secondary | ICD-10-CM

## 2017-04-22 LAB — POCT INR: INR: 1.9

## 2017-04-23 NOTE — Addendum Note (Signed)
Addendum  created 04/23/17 0909 by Effie Berkshire, MD   Sign clinical note

## 2017-04-25 ENCOUNTER — Other Ambulatory Visit (INDEPENDENT_AMBULATORY_CARE_PROVIDER_SITE_OTHER): Payer: Self-pay | Admitting: Family

## 2017-04-26 ENCOUNTER — Ambulatory Visit (INDEPENDENT_AMBULATORY_CARE_PROVIDER_SITE_OTHER): Payer: Medicare Other | Admitting: Orthopedic Surgery

## 2017-04-27 ENCOUNTER — Telehealth (INDEPENDENT_AMBULATORY_CARE_PROVIDER_SITE_OTHER): Payer: Self-pay | Admitting: Family

## 2017-04-27 NOTE — Telephone Encounter (Signed)
I called pt and she states that before she picks up the rx that was filled yesterday that she will just have him take the Neurontin 300 mg tid and then discuss with Dr. Sharol Given at appt on monday

## 2017-04-27 NOTE — Telephone Encounter (Signed)
Patient's wife Scott [name]) called advised her husband has run out  of the (Gabapentin)  400 mg. She advised the change in the dosage and time of day patient was taking the gabapentin according to the patient did not make any difference. [name] stated that she still have the 300mg  tabs left. She asked if patient can continue to take those until his appointment on 05/03/17 or stop taking them all together? The number to contact Scott [name] is [phone]

## 2017-04-28 ENCOUNTER — Ambulatory Visit: Payer: Medicare Other | Attending: Orthopedic Surgery | Admitting: Physical Therapy

## 2017-04-28 ENCOUNTER — Encounter: Payer: Self-pay | Admitting: Physical Therapy

## 2017-04-28 DIAGNOSIS — M79641 Pain in right hand: Secondary | ICD-10-CM | POA: Insufficient documentation

## 2017-04-28 DIAGNOSIS — M6281 Muscle weakness (generalized): Secondary | ICD-10-CM | POA: Diagnosis not present

## 2017-04-28 DIAGNOSIS — R293 Abnormal posture: Secondary | ICD-10-CM

## 2017-04-28 DIAGNOSIS — M79642 Pain in left hand: Secondary | ICD-10-CM | POA: Diagnosis not present

## 2017-04-28 DIAGNOSIS — R29898 Other symptoms and signs involving the musculoskeletal system: Secondary | ICD-10-CM | POA: Diagnosis not present

## 2017-04-28 DIAGNOSIS — R2681 Unsteadiness on feet: Secondary | ICD-10-CM | POA: Diagnosis not present

## 2017-04-28 DIAGNOSIS — R208 Other disturbances of skin sensation: Secondary | ICD-10-CM | POA: Insufficient documentation

## 2017-04-28 DIAGNOSIS — R2689 Other abnormalities of gait and mobility: Secondary | ICD-10-CM

## 2017-04-28 NOTE — Therapy (Signed)
[facility] [address] [place], [place], [address] Phone: [phone]   Fax:  [phone]  Physical Therapy Treatment  Patient Details  Name: Scott [name] MRN: [id number] Date of Birth: [birth year] Referring Provider: Meridee Score MD   Encounter Date: 04/28/2017      PT End of Session - 04/28/17 1511    Visit Number 2   Number of Visits 33   Date for PT Re-Evaluation 06/18/17   Authorization Type Medicare & G-codes every 10th visit    PT Start Time 10   PT Stop Time 1315   PT Time Calculation (min) 45 min   Equipment Utilized During Treatment Gait belt   Activity Tolerance Patient tolerated treatment well   Behavior During Therapy [facility] for tasks assessed/performed      Past Medical History:  Diagnosis Date  . Arthritis    "hips, shoulders; knees; back" (10/20/2016)  . Bicuspid aortic valve   . BPH (benign prostatic hypertrophy)   . Carpal tunnel syndrome of right wrist   . Coronary artery disease involving native coronary artery of native heart with unstable angina pectoris ([place]) 10/20/2016  . Diverticulitis   . GERD (gastroesophageal reflux disease)   . Gout   . Heart murmur   . Hyperlipemia   . Hypertension   . Postoperative atrial fibrillation ([place]) 10/24/2016  . RLS (restless legs syndrome)   . S/P aortic valve replacement with bioprosthetic valve 10/22/2016   25 mm [facility] Ease bovine pericardial bioprosthetic tissue valve  . S/P ascending aortic aneurysm repair 10/22/2016   28 mm supracoronary straight graft replacement of ascending thoracic aortic aneurysm  . S/P CABG x 3 10/22/2016   Sequential LIMA to Diag and LAD, SVG to distal LAD, open vein harvest right thigh  . S/P mitral valve repair 10/22/2016   Artificial Gore-tex neochord placement x6 - posterior annuloplasty band placed but removed due to systolic anterior motion of mitral valve  . Severe aortic stenosis   . Snores    Never  been tested for sleep apnea  . Thoracic ascending aortic aneurysm ([place]) 10/21/2016  . Thrombocytopenia ([place]) 10/22/2016  . Type II diabetes mellitus ([place])     Past Surgical History:  Procedure Laterality Date  . AMPUTATION Bilateral 12/11/2016   Procedure: AMPUTATION BELOW KNEE BILATERALLY;  Surgeon: [name], MD;  Location: [place];  Service: Orthopedics;  Laterality: Bilateral;  . AMPUTATION Bilateral 12/11/2016   Procedure: AMPUTATION BILATERAL HANDS EXCEPT RIGHT THUMB;  Surgeon: [name], MD;  Location: [facility];  Service: Orthopedics;  Laterality: Bilateral;  . AORTIC VALVE REPLACEMENT N/A 10/22/2016   Procedure: AORTIC VALVE REPLACEMENT (AVR) WITH SIZE 25 MM MAGNA EASE PERICARDIAL BIOPROSTHESIS - AORTIC;  Surgeon: Rexene Alberts, MD;  Location: [place];  Service: Open Heart Surgery;  Laterality: N/A;  . BONE EXCISION Right 02/01/2017   Procedure: EXCISION RIGHT INDEX METACARPAL HEAD;  Surgeon: [name], MD;  Location: [facility];  Service: Orthopedics;  Laterality: Right;  . CARDIAC CATHETERIZATION N/A 10/20/2016   Procedure: Right/Left Heart Cath and Coronary Angiography;  Surgeon: Leonie Man, MD;  Location: [facility];  Service: Cardiovascular;  Laterality: N/A;  . CARPAL TUNNEL RELEASE Right 11/28/2013   Procedure: RIGHT WRIST CARPAL TUNNEL RELEASE;  Surgeon: Lorn Junes, MD;  Location: [facility];  Service: Orthopedics;  Laterality: Right;  . CATARACT EXTRACTION W/ INTRAOCULAR LENS  IMPLANT, BILATERAL Bilateral 1978  . COLONOSCOPY    .  CORONARY ARTERY BYPASS GRAFT N/A 10/22/2016   Procedure: CORONARY ARTERY BYPASS GRAFTING (CABG)x 2 WITH LIMA TO DIAGONAL, OPEN  HARVESTING OF RIGHT SAPHENOUS VEIN FOR VEIN GRAFT TO LAD;  Surgeon: Rexene Alberts, MD;  Location: [facility];  Service: Open Heart Surgery;  Laterality: N/A;  . FRACTURE SURGERY    . INGUINAL HERNIA REPAIR Right 1998  . IR GENERIC HISTORICAL  12/07/2016   IR US GUIDE VASC ACCESS RIGHT 12/07/2016  Aletta Edouard, MD [facility]  . IR GENERIC HISTORICAL  12/07/2016   IR RADIOLOGY PERIPHERAL GUIDED IV START 12/07/2016 Aletta Edouard, MD [facility]  . IR GENERIC HISTORICAL  12/07/2016   IR GASTROSTOMY TUBE MOD SED 12/07/2016 Aletta Edouard, MD [facility]  . LIPOMA EXCISION Right 2008   "side of my head"  . MITRAL VALVE REPAIR N/A 10/22/2016   Procedure: MITRAL VALVE REPAIR (MVR) WITH SIZE 30 SORIN ANNULOFLEX ANNULOPLASTY RING WITH SUBSEQUENT REMOVAL OF RING;  Surgeon: Rexene Alberts, MD;  Location: [facility];  Service: Open Heart Surgery;  Laterality: N/A;  . PENECTOMY  2007   Peyronie's disease   . PENILE PROSTHESIS IMPLANT  2009  . REMOVAL OF PENILE PROSTHESIS N/A 02/01/2017   Procedure: REMOVAL OF PENILE PROSTHESIS;  Surgeon: Kathie Rhodes, MD;  Location: [facility];  Service: Urology;  Laterality: N/A;  . SHOULDER OPEN ROTATOR CUFF REPAIR Right 2006  . STERNAL CLOSURE N/A 10/26/2016   Procedure: STERNAL WASHOUT AND DELAYED PRIMARY CLOSURE;  Surgeon: Rexene Alberts, MD;  Location: [facility];  Service: Thoracic;  Laterality: N/A;  . TEE WITHOUT CARDIOVERSION N/A 10/26/2016   Procedure: TRANSESOPHAGEAL ECHOCARDIOGRAM (TEE);  Surgeon: Rexene Alberts, MD;  Location: [facility];  Service: Thoracic;  Laterality: N/A;  . TEE WITHOUT CARDIOVERSION N/A 10/22/2016   Procedure: TRANSESOPHAGEAL ECHOCARDIOGRAM (TEE);  Surgeon: Rexene Alberts, MD;  Location: [facility];  Service: Open Heart Surgery;  Laterality: N/A;  . THORACIC AORTIC ANEURYSM REPAIR  10/22/2016   Procedure: ASCENDING AORTIC  ANEURYSM REPAIR (AAA) WITH 28 MM HEMASHIELD PLATINUM WOVEN DOUBLE VELOUR VASCULAR GRAFT;  Surgeon: Rexene Alberts, MD;  Location: [facility];  Service: Open Heart Surgery;;  . TONSILLECTOMY  ~ 1955  . TRACHEOSTOMY TUBE PLACEMENT N/A 11/09/2016   Procedure: TRACHEOSTOMY;  Surgeon: Rexene Alberts, MD;  Location: [place] [place];  Service: Thoracic;  Laterality: N/A;  . TRANSURETHRAL RESECTION OF PROSTATE  2005  . TRIGGER FINGER RELEASE  Bilateral    several lt and rt hands  . TRIGGER FINGER RELEASE Right 11/28/2013   Procedure: RIGHT TRIGGER FINGER  RELEASE (TENDON SHEATH INCISION);  Surgeon: Lorn Junes, MD;  Location: [facility];  Service: Orthopedics;  Laterality: Right;  Marland Kitchen VIDEO BRONCHOSCOPY N/A 11/09/2016   Procedure: VIDEO BRONCHOSCOPY;  Surgeon: Rexene Alberts, MD;  Location: [facility];  Service: Thoracic;  Laterality: N/A;  . WRIST FRACTURE SURGERY Right ~ 1959    There were no vitals filed for this visit.      Subjective Assessment - 04/28/17 1236    Subjective Patient denies any changes or falls since last visit. Prosthetist Richardson Landry) was present during today's PT session.    Patient is accompained by: Family member  wife, [name] & dtr, [name]   Pertinent History arthritis, bicuspid aortic valve, BPH, carpal tunnel syndrome in R wrist, CAD, diverticulitis, GERD, gout, hyperlipidemia, HTN, DM type II,thrombocytopenia, thoracic ascending aortic aneurysm, severe aortic stenosis, amputation of bilateral hands except right thumb (12/11/2016) due to gangrene, bilateral transtibial amputations (12/11/2016)  due to gangrene, R and L heart cath (10/20/2016)    Limitations Standing;Walking;House hold activities   Patient Stated Goals use prostheses to walk including stairs, outdoors, target shooting, drive   Currently in Pain? No/denies                         [facility] Adult PT Treatment/Exercise - 04/28/17 1230      Transfers   Transfers Sit to Stand;Stand to Sit   Sit to Stand 4: Min assist;With upper extremity assist;From chair/3-in-1  to RW with platform    Sit to Stand Details Verbal cues for sequencing;Verbal cues for technique;Verbal cues for precautions/safety   Sit to Stand Details (indicate cue type and reason) requires demonstration and cueing for hand placement and technique    Stand to Sit 4: Min assist;With upper extremity assist;With armrests;To chair/3-in-1  to RW with platform     Stand to Sit Details (indicate cue type and reason) Verbal cues for sequencing;Verbal cues for technique;Verbal cues for precautions/safety   Stand to Sit Details Requires demonstration and cueing for proper technique   Squat Pivot Transfers --     Ambulation/Gait   Ambulation/Gait Yes   Ambulation/Gait Assistance 4: Min assist   Ambulation/Gait Assistance Details Requires cueing for foot placement and proper technique. Ambulated 197ft with B platforms on RW and 164ft with L platform on RW + R walker hand splint.   Ambulation Distance (Feet) 120 Feet  x2   Assistive device Bilateral platform walker;Prostheses;Left platform walker   Gait Pattern Step-to pattern;Decreased step length - right;Decreased step length - left;Decreased stride length;Right flexed knee in stance;Left flexed knee in stance;Trunk flexed;Narrow base of support   Ambulation Surface Level;Indoor     Posture/Postural Control   Posture/Postural Control Postural limitations   Postural Limitations Rounded Shoulders;Forward head;Flexed trunk     Prosthetics   Prosthetic Care Comments  Patient presents to PT with 1-ply donned on bilateral LEs. PT educated to increase wear time to 2hrs 3x/day with a 2hour break between each wear period based upon PT's assessment of patient's skin integrity.   Current prosthetic wear tolerance (days/week)  daily    Current prosthetic wear tolerance (#hours/day)  1hr 2-3x/d   Current prosthetic weight-bearing tolerance (hours/day)  --   Edema non-pitting    Residual limb condition  L residual limb: mild drainage on wound on medial side of incision. PT placed Tegaderm. Scabbed wounds on tibial crest appear to be healing. 3 scabbed wounds along incision appear to be healing. R residual limb: 3 scabbed wounds along incision and 1 scabbed wound on tibial crest appear to be healing.   Education Provided Skin check;Residual limb care;Proper Donning;Proper Doffing;Proper wear schedule/adjustment;Proper  weight-bearing schedule/adjustment   Person(s) Educated Patient;Spouse;Child(ren)   Education Method Explanation;Demonstration;Tactile cues;Verbal cues   Education Method Verbalized understanding;Tactile cues required;Verbal cues required;Needs further instruction                PT Education - 04/28/17 1248    Education provided Yes   Education Details see prosthetic section; create appointment with Richardson Landry (prosthetist) for R hand splint    Person(s) Educated Patient;Spouse;Child(ren)   Methods Explanation   Comprehension Verbalized understanding          PT Short Term Goals - 04/28/17 1637      PT SHORT TERM GOAL #1   Title Patient and patient's caregiver (due to patient's bilateral hand amputations) will verbalize understanding of proper cleaning, donning, and doffing of prostheses. (  TARGET DATE: 05/21/2017)    Time 4   Period Weeks   Status On-going     PT SHORT TERM GOAL #2   Title Patient will tolerate wear 7 days/week for 6hrs total per day without signs of decline in skin integrity issues. (TARGET DATE: 05/21/2017)    Time 4   Period Weeks   Status On-going     PT SHORT TERM GOAL #3   Title Patient will demonstrate static standing balance for 2 minutes and stationery dynamic of head turns to scan with prostheses and unilateral UE support and supervision to indicate an increase in balance and a decrease in falls risk. (TARGET DATE: 05/21/2017)    Time 4   Period Weeks   Status On-going     PT SHORT TERM GOAL #4   Title Patient will demonstrate ability to reach anteriorly 5 inches and to knee level towards the ground with unilateral UE support without a loss of balance to indicate a decrease in his risk of falling. (TARGET DATE: 05/21/2017)    Time 4   Period Weeks   Status On-going     PT SHORT TERM GOAL #5   Title Patient will ambulate 200 feet with min guard with bilateral platform RW to indicate a decrease in his risk of falling. (TARGET DATE: 05/21/2017)     Time 4   Period Weeks   Status On-going           PT Long Term Goals - 04/28/17 1638      PT LONG TERM GOAL #1   Title Patient modified independently donnes & doffes, adjusts ply socks and pt / wife verbalize understanding of proper prosthetic care to ensure safe use of prostheses.  (Target Date: 08/20/2017)   Time 4   Period Months   Status On-going     PT LONG TERM GOAL #2   Title Patient will tolerate prostheses wear >90% of awake hours without skin issues or limb pain to demonstrate a decrease in his risk of falling. (Target Date: 08/20/2017)   Time 4   Period Months   Status On-going     PT LONG TERM GOAL #3   Title Patient performs standing balance activities with intermittent UE support reaching 10", picking up objects from floor and looks over shoulders with weight shift, trunk rotation with prostheses independently. (Target Date: 08/20/2017)     Time 4   Period Weeks   Status On-going     PT LONG TERM GOAL #4   Title Patient will ambulate 1000 feet including outdoor surfaces with LRAD and prostheses modified independently to enable community mobility.  (Target Date: 08/20/2017)   Time 4   Period Months   Status On-going     PT LONG TERM GOAL #5   Title Patient will negotiate ramp/curbs and stairs with LRAD and prostheses modified independent for community access. (Target Date: 08/20/2017)   Time 4   Period Months   Status On-going     PT LONG TERM GOAL #6   Title Patient's gait velocity will be >/= 1.74ft/s to indicate a limited community ambulator. (Target Date: 08/20/2017)   Time 4   Period Weeks   Status On-going     PT LONG TERM GOAL #7   Title Patient reports Activities of Balance Confindence score using FOTO >25% to indicate greater confidence in his balance. (Target Date: 08/20/2017)   Time 4   Period Months   Status On-going  Plan - 04/28/17 1514    Clinical Impression Statement Today's skilled PT session focused on advancing  patient's prosthetic gait. PT instructed patient in ambulating with RW with bilateral platforms, and then instructed patient in ambulating with RW with L platform + R walker hand splint grip. Patient demonstrated improved gait mechanics and PT qualitatively noted increased gait velocity with L platform + R walker hand splint. With modification to standard RW hand grips, the patient appears he will be able to ambulate with bilateral walker hand splints instead of ambulating with bilateral platforms on RW, which will help improve patient's prosthetic gait. Prosthetist Richardson Landry) will modify bilateral walker hand splints to accommodate for patient's hand deformities. Patient is making progress towards goals, and will benefit from continued skilled PT to address functional mobility deficits.       Rehab Potential Good   Clinical Impairments Affecting Rehab Potential arthritis, bicuspid aortic valve, BPH, carpal tunnel syndrome in R wrist, CAD, diverticulitis, GERD, gout, hyperlipidemia, HTN, DM type II,thrombocytopenia, thoracic ascending aortic aneurysm, severe aortic stenosis, amputation of bilateral hands except right thumb (12/11/2016) due to gangrene, bilateral transtibial amputations (12/11/2016) due to gangrene, R and L heart cath (10/20/2016)    PT Frequency 2x / week   PT Duration Other (comment)  16 weeks   PT Treatment/Interventions ADLs/Self Care Home Management;Neuromuscular re-education;Balance training;Therapeutic exercise;Therapeutic activities;Functional mobility training;Stair training;Gait training;DME Instruction;Patient/family education;Prosthetic Training   PT Next Visit Plan initiate HEP standing at sink; continue prosthetic wear/care education and assess status of healing wounds on bilateral LEs    Consulted and Agree with Plan of Care Patient;Family member/caregiver   Family Member Consulted Wife - [name]; Daughter - [name]       Patient will benefit from skilled therapeutic intervention in  order to improve the following deficits and impairments:  Abnormal gait, Decreased activity tolerance, Decreased balance, Decreased coordination, Decreased range of motion, Decreased mobility, Decreased knowledge of use of DME, Decreased knowledge of precautions, Decreased endurance, Decreased skin integrity, Decreased scar mobility, Decreased strength, Difficulty walking, Postural dysfunction, Prosthetic Dependency  Visit Diagnosis: Unsteadiness on feet  Other abnormalities of gait and mobility  Abnormal posture  Muscle weakness (generalized)     Problem List Patient Active Problem List   Diagnosis Date Noted  . Amputation of both hands with complication, subsequent encounter 03/01/2017  . Diabetes ([place]) 02/19/2017  . Status post bilateral below knee amputation ([place]) 02/09/2017  . Wound disruption, post-op, skin, sequela 02/09/2017  . Debility 02/05/2017  . Ganglion upper arm, right   . Thrombosis of both upper extremities   . Suspected heparin induced thrombocytopenia (HIT) in hospitalized patient ([place])   . Gangrene of lower extremity ([place])   . Heparin induced thrombocytopenia (HIT) ([place])   . Tracheostomy status ([place])   . Chest tube in place   . Atherosclerosis of native arteries of extremities with gangrene, left leg ([place])   . Atherosclerosis of native arteries of extremities with gangrene, right leg ([place])   . Acute on chronic diastolic CHF (congestive heart failure) ([place])   . Enteritis due to Clostridium difficile   . Anasarca   . FUO (fever of unknown origin)   . Acute encephalopathy   . Elevated LFTs   . DIC (disseminated intravascular coagulation) ([place]) 10/28/2016  . Postoperative atrial fibrillation ([place]) 10/24/2016  . Cardiogenic shock ([place])   . Mitral regurgitation due to cusp prolapse 10/22/2016  . S/P aortic valve replacement with bioprosthetic valve 10/22/2016  . S/P ascending aortic aneurysm repair 10/22/2016  .  S/P mitral valve repair 10/22/2016  . S/P CABG  x 3 10/22/2016  . Thrombocytopenia ([place]) 10/22/2016  . Acute combined systolic and diastolic congestive heart failure ([place]) 10/22/2016  . Acute respiratory failure ([place]) 10/22/2016  . Thoracic ascending aortic aneurysm ([place]) 10/21/2016  . Coronary artery disease involving native coronary artery of native heart with unstable angina pectoris ([place]) 10/20/2016  . Severe aortic stenosis by prior echocardiography 10/19/2016  . Unstable angina ([place]) 10/19/2016  . Aortic valve stenosis 10/19/2016  . Bicuspid aortic valve 10/19/2016  . Trigger ring finger of right hand   . Wears glasses   . Gout   . Hypertension   . Carpal tunnel syndrome of right wrist   . Arthritis   . Snores     [place], SPT  04/28/2017, 4:44 PM  [facility] [address] [place], [place], [address] Phone: [phone]   Fax:  [phone]  Name: Scott [name] MRN: [id number] Date of Birth: [birth year]

## 2017-04-29 ENCOUNTER — Encounter: Payer: Self-pay | Admitting: Physical Therapy

## 2017-04-29 ENCOUNTER — Telehealth: Payer: Self-pay | Admitting: Physical Therapy

## 2017-04-29 ENCOUNTER — Telehealth: Payer: Self-pay | Admitting: *Deleted

## 2017-04-29 ENCOUNTER — Other Ambulatory Visit (INDEPENDENT_AMBULATORY_CARE_PROVIDER_SITE_OTHER): Payer: Self-pay | Admitting: Orthopedic Surgery

## 2017-04-29 ENCOUNTER — Ambulatory Visit: Payer: Medicare Other | Admitting: Physical Therapy

## 2017-04-29 DIAGNOSIS — R2681 Unsteadiness on feet: Secondary | ICD-10-CM | POA: Diagnosis not present

## 2017-04-29 DIAGNOSIS — M6281 Muscle weakness (generalized): Secondary | ICD-10-CM | POA: Diagnosis not present

## 2017-04-29 DIAGNOSIS — R2689 Other abnormalities of gait and mobility: Secondary | ICD-10-CM | POA: Diagnosis not present

## 2017-04-29 DIAGNOSIS — R29898 Other symptoms and signs involving the musculoskeletal system: Secondary | ICD-10-CM | POA: Diagnosis not present

## 2017-04-29 DIAGNOSIS — Z89511 Acquired absence of right leg below knee: Secondary | ICD-10-CM

## 2017-04-29 DIAGNOSIS — R293 Abnormal posture: Secondary | ICD-10-CM

## 2017-04-29 DIAGNOSIS — Z89512 Acquired absence of left leg below knee: Principal | ICD-10-CM

## 2017-04-29 DIAGNOSIS — R208 Other disturbances of skin sensation: Secondary | ICD-10-CM | POA: Diagnosis not present

## 2017-04-29 NOTE — Therapy (Signed)
[facility] [address] [place], [place], [address] Phone: [phone]   Fax:  [phone]  Physical Therapy Treatment  Patient Details  Name: Scott [name] MRN: [id number] Date of Birth: 08-Aug-1946 Referring Provider: Meridee Score MD   Encounter Date: 04/29/2017      PT End of Session - 04/29/17 1529    Visit Number 3   Number of Visits 33   Date for PT Re-Evaluation 06/18/17   Authorization Type Medicare & G-codes every 10th visit    PT Start Time 1400   PT Stop Time 1450   PT Time Calculation (min) 50 min   Equipment Utilized During Treatment Gait belt   Activity Tolerance Patient tolerated treatment well   Behavior During Therapy [facility] for tasks assessed/performed      Past Medical History:  Diagnosis Date  . Arthritis    "hips, shoulders; knees; back" (10/20/2016)  . Bicuspid aortic valve   . BPH (benign prostatic hypertrophy)   . Carpal tunnel syndrome of right wrist   . Coronary artery disease involving native coronary artery of native heart with unstable angina pectoris ([place]) 10/20/2016  . Diverticulitis   . GERD (gastroesophageal reflux disease)   . Gout   . Heart murmur   . Hyperlipemia   . Hypertension   . Postoperative atrial fibrillation ([place]) 10/24/2016  . RLS (restless legs syndrome)   . S/P aortic valve replacement with bioprosthetic valve 10/22/2016   25 mm [facility] Ease bovine pericardial bioprosthetic tissue valve  . S/P ascending aortic aneurysm repair 10/22/2016   28 mm supracoronary straight graft replacement of ascending thoracic aortic aneurysm  . S/P CABG x 3 10/22/2016   Sequential LIMA to Diag and LAD, SVG to distal LAD, open vein harvest right thigh  . S/P mitral valve repair 10/22/2016   Artificial Gore-tex neochord placement x6 - posterior annuloplasty band placed but removed due to systolic anterior motion of mitral valve  . Severe aortic stenosis   . Snores    Never  been tested for sleep apnea  . Thoracic ascending aortic aneurysm ([place]) 10/21/2016  . Thrombocytopenia ([place]) 10/22/2016  . Type II diabetes mellitus ([place])     Past Surgical History:  Procedure Laterality Date  . AMPUTATION Bilateral 12/11/2016   Procedure: AMPUTATION BELOW KNEE BILATERALLY;  Surgeon: [name], MD;  Location: [place];  Service: Orthopedics;  Laterality: Bilateral;  . AMPUTATION Bilateral 12/11/2016   Procedure: AMPUTATION BILATERAL HANDS EXCEPT RIGHT THUMB;  Surgeon: [name], MD;  Location: [place];  Service: Orthopedics;  Laterality: Bilateral;  . AORTIC VALVE REPLACEMENT N/A 10/22/2016   Procedure: AORTIC VALVE REPLACEMENT (AVR) WITH SIZE 25 MM MAGNA EASE PERICARDIAL BIOPROSTHESIS - AORTIC;  Surgeon: Rexene Alberts, MD;  Location: [place];  Service: Open Heart Surgery;  Laterality: N/A;  . BONE EXCISION Right 02/01/2017   Procedure: EXCISION RIGHT INDEX METACARPAL HEAD;  Surgeon: [name], MD;  Location: [place];  Service: Orthopedics;  Laterality: Right;  . CARDIAC CATHETERIZATION N/A 10/20/2016   Procedure: Right/Left Heart Cath and Coronary Angiography;  Surgeon: Leonie Man, MD;  Location: [place] [facility];  Service: Cardiovascular;  Laterality: N/A;  . CARPAL TUNNEL RELEASE Right 11/28/2013   Procedure: RIGHT WRIST CARPAL TUNNEL RELEASE;  Surgeon: Lorn Junes, MD;  Location: [place];  Service: Orthopedics;  Laterality: Right;  . CATARACT EXTRACTION W/ INTRAOCULAR LENS  IMPLANT, BILATERAL Bilateral 1978  . COLONOSCOPY    .  CORONARY ARTERY BYPASS GRAFT N/A 10/22/2016   Procedure: CORONARY ARTERY BYPASS GRAFTING (CABG)x 2 WITH LIMA TO DIAGONAL, OPEN  HARVESTING OF RIGHT SAPHENOUS VEIN FOR VEIN GRAFT TO LAD;  Surgeon: Rexene Alberts, MD;  Location: [place];  Service: Open Heart Surgery;  Laterality: N/A;  . FRACTURE SURGERY    . INGUINAL HERNIA REPAIR Right 1998  . IR GENERIC HISTORICAL  12/07/2016   IR US GUIDE VASC ACCESS RIGHT 12/07/2016  Aletta Edouard, MD [facility]  . IR GENERIC HISTORICAL  12/07/2016   IR RADIOLOGY PERIPHERAL GUIDED IV START 12/07/2016 Aletta Edouard, MD [facility]  . IR GENERIC HISTORICAL  12/07/2016   IR GASTROSTOMY TUBE MOD SED 12/07/2016 Aletta Edouard, MD [facility]  . LIPOMA EXCISION Right 2008   "side of my head"  . MITRAL VALVE REPAIR N/A 10/22/2016   Procedure: MITRAL VALVE REPAIR (MVR) WITH SIZE 30 SORIN ANNULOFLEX ANNULOPLASTY RING WITH SUBSEQUENT REMOVAL OF RING;  Surgeon: Rexene Alberts, MD;  Location: [place];  Service: Open Heart Surgery;  Laterality: N/A;  . PENECTOMY  2007   Peyronie's disease   . PENILE PROSTHESIS IMPLANT  2009  . REMOVAL OF PENILE PROSTHESIS N/A 02/01/2017   Procedure: REMOVAL OF PENILE PROSTHESIS;  Surgeon: Kathie Rhodes, MD;  Location: [place];  Service: Urology;  Laterality: N/A;  . SHOULDER OPEN ROTATOR CUFF REPAIR Right 2006  . STERNAL CLOSURE N/A 10/26/2016   Procedure: STERNAL WASHOUT AND DELAYED PRIMARY CLOSURE;  Surgeon: Rexene Alberts, MD;  Location: [place];  Service: Thoracic;  Laterality: N/A;  . TEE WITHOUT CARDIOVERSION N/A 10/26/2016   Procedure: TRANSESOPHAGEAL ECHOCARDIOGRAM (TEE);  Surgeon: Rexene Alberts, MD;  Location: [place];  Service: Thoracic;  Laterality: N/A;  . TEE WITHOUT CARDIOVERSION N/A 10/22/2016   Procedure: TRANSESOPHAGEAL ECHOCARDIOGRAM (TEE);  Surgeon: Rexene Alberts, MD;  Location: [place];  Service: Open Heart Surgery;  Laterality: N/A;  . THORACIC AORTIC ANEURYSM REPAIR  10/22/2016   Procedure: ASCENDING AORTIC  ANEURYSM REPAIR (AAA) WITH 28 MM HEMASHIELD PLATINUM WOVEN DOUBLE VELOUR VASCULAR GRAFT;  Surgeon: Rexene Alberts, MD;  Location: [place];  Service: Open Heart Surgery;;  . TONSILLECTOMY  ~ 1955  . TRACHEOSTOMY TUBE PLACEMENT N/A 11/09/2016   Procedure: TRACHEOSTOMY;  Surgeon: Rexene Alberts, MD;  Location: [place];  Service: Thoracic;  Laterality: N/A;  . TRANSURETHRAL RESECTION OF PROSTATE  2005  . TRIGGER FINGER RELEASE  Bilateral    several lt and rt hands  . TRIGGER FINGER RELEASE Right 11/28/2013   Procedure: RIGHT TRIGGER FINGER  RELEASE (TENDON SHEATH INCISION);  Surgeon: Lorn Junes, MD;  Location: [place];  Service: Orthopedics;  Laterality: Right;  Marland Kitchen VIDEO BRONCHOSCOPY N/A 11/09/2016   Procedure: VIDEO BRONCHOSCOPY;  Surgeon: Rexene Alberts, MD;  Location: [place];  Service: Thoracic;  Laterality: N/A;  . WRIST FRACTURE SURGERY Right ~ 1959    There were no vitals filed for this visit.      Subjective Assessment - 04/29/17 1407    Subjective No new complaints. No falls or pain to report.    Patient is accompained by: Family member  wife, Danton Clap and son in Museum/gallery exhibitions officer   Pertinent History arthritis, bicuspid aortic valve, BPH, carpal tunnel syndrome in R wrist, CAD, diverticulitis, GERD, gout, hyperlipidemia, HTN, DM type II,thrombocytopenia, thoracic ascending aortic aneurysm, severe aortic stenosis, amputation of bilateral hands except right thumb (12/11/2016) due to gangrene, bilateral transtibial amputations (12/11/2016) due to gangrene, R and L  heart cath (10/20/2016)    Limitations Standing;Walking;House hold activities   Patient Stated Goals use prostheses to walk including stairs, outdoors, target shooting, drive   Currently in Pain? No/denies                         [facility] Adult PT Treatment/Exercise - 04/29/17 1400      Transfers   Transfers Sit to Stand;Stand to Sit   Sit to Stand 4: Min assist;With upper extremity assist;From chair/3-in-1  to sink    Sit to Stand Details Verbal cues for sequencing;Verbal cues for technique;Verbal cues for precautions/safety   Sit to Stand Details (indicate cue type and reason) requires cueing for foot and hand placment and technique    Stand to Sit 4: Min assist;With upper extremity assist;With armrests;To chair/3-in-1  to sink    Stand to Sit Details (indicate cue type and reason) Verbal cues for sequencing;Verbal cues  for technique;Verbal cues for precautions/safety     Ambulation/Gait   Ambulation/Gait --   Ambulation/Gait Assistance --   Ambulation Distance (Feet) --   Assistive device --   Gait Pattern --     Posture/Postural Control   Posture/Postural Control Postural limitations   Postural Limitations Rounded Shoulders;Forward head;Flexed trunk     Prosthetics   Prosthetic Care Comments  Patient presents to PT with 3-ply socks donned on bilateral LEs.    Current prosthetic wear tolerance (days/week)  daily    Current prosthetic wear tolerance (#hours/day)  2hr 2-3x/d   Current prosthetic weight-bearing tolerance (hours/day)  patient tolerated approximately 15 minutes of weight bearing with UE support without complaints of increased pain during PT session   Edema non-pitting    Residual limb condition  L residual limb: minimal drainage on wound on medial side of incision, and patient presented with Tegaderm in place. Scabbed wounds on tibial crest, and 3 scabbed wounds along incision appear to be healing. R residual limb: 3 scabbed wounds along incision and 1 scabbed wound on tibial crest appear to be healing.    Education Provided Skin check;Residual limb care;Proper Donning;Proper Doffing;Proper wear schedule/adjustment;Proper weight-bearing schedule/adjustment   Person(s) Educated Patient;Spouse;Child(ren)   Education Method Explanation   Education Method Verbalized understanding                PT Education - 04/29/17 1529    Education provided Yes   Education Details sink HEP; information on various types of elevated toilet seats; proper use of gait belt (how to fasten it) for use in the home as needed    Person(s) Educated Patient;Spouse   Methods Explanation;Demonstration;Tactile cues;Verbal cues;Handout   Comprehension Verbalized understanding;Returned demonstration          PT Short Term Goals - 04/28/17 1637      PT SHORT TERM GOAL #1   Title Patient and patient's  caregiver (due to patient's bilateral hand amputations) will verbalize understanding of proper cleaning, donning, and doffing of prostheses. (TARGET DATE: 05/21/2017)    Time 4   Period Weeks   Status On-going     PT SHORT TERM GOAL #2   Title Patient will tolerate wear 7 days/week for 6hrs total per day without signs of decline in skin integrity issues. (TARGET DATE: 05/21/2017)    Time 4   Period Weeks   Status On-going     PT SHORT TERM GOAL #3   Title Patient will demonstrate static standing balance for 2 minutes and stationery dynamic of head turns to scan  with prostheses and unilateral UE support and supervision to indicate an increase in balance and a decrease in falls risk. (TARGET DATE: 05/21/2017)    Time 4   Period Weeks   Status On-going     PT SHORT TERM GOAL #4   Title Patient will demonstrate ability to reach anteriorly 5 inches and to knee level towards the ground with unilateral UE support without a loss of balance to indicate a decrease in his risk of falling. (TARGET DATE: 05/21/2017)    Time 4   Period Weeks   Status On-going     PT SHORT TERM GOAL #5   Title Patient will ambulate 200 feet with min guard with bilateral platform RW to indicate a decrease in his risk of falling. (TARGET DATE: 05/21/2017)    Time 4   Period Weeks   Status On-going           PT Long Term Goals - 04/28/17 1638      PT LONG TERM GOAL #1   Title Patient modified independently donnes & doffes, adjusts ply socks and pt / wife verbalize understanding of proper prosthetic care to ensure safe use of prostheses.  (Target Date: 08/20/2017)   Time 4   Period Months   Status On-going     PT LONG TERM GOAL #2   Title Patient will tolerate prostheses wear >90% of awake hours without skin issues or limb pain to demonstrate a decrease in his risk of falling. (Target Date: 08/20/2017)   Time 4   Period Months   Status On-going     PT LONG TERM GOAL #3   Title Patient performs standing balance  activities with intermittent UE support reaching 10", picking up objects from floor and looks over shoulders with weight shift, trunk rotation with prostheses independently. (Target Date: 08/20/2017)     Time 4   Period Weeks   Status On-going     PT LONG TERM GOAL #4   Title Patient will ambulate 1000 feet including outdoor surfaces with LRAD and prostheses modified independently to enable community mobility.  (Target Date: 08/20/2017)   Time 4   Period Months   Status On-going     PT LONG TERM GOAL #5   Title Patient will negotiate ramp/curbs and stairs with LRAD and prostheses modified independent for community access. (Target Date: 08/20/2017)   Time 4   Period Months   Status On-going     PT LONG TERM GOAL #6   Title Patient's gait velocity will be >/= 1.76ft/s to indicate a limited community ambulator. (Target Date: 08/20/2017)   Time 4   Period Weeks   Status On-going     PT LONG TERM GOAL #7   Title Patient reports Activities of Balance Confindence score using FOTO >25% to indicate greater confidence in his balance. (Target Date: 08/20/2017)   Time 4   Period Months   Status On-going               Plan - 04/29/17 1537    Clinical Impression Statement Today's PT session focused on continuing patient's prosthetic care/wear education and introducing the "sink HEP" to patient to encourage safe, static standing, weight shifting activities at home. Patient required two seated rest breaks due to LE fatigue, but he denied any pain while completing these activities. Patient's wounds on both R and L residual limbs appear to be healing well. PT issued patient RW per MD order. Patient is making progress towards goals, and will benefit from  continued skilled PT to address functional mobility deficits.    Rehab Potential Good   Clinical Impairments Affecting Rehab Potential arthritis, bicuspid aortic valve, BPH, carpal tunnel syndrome in R wrist, CAD, diverticulitis, GERD, gout,  hyperlipidemia, HTN, DM type II,thrombocytopenia, thoracic ascending aortic aneurysm, severe aortic stenosis, amputation of bilateral hands except right thumb (12/11/2016) due to gangrene, bilateral transtibial amputations (12/11/2016) due to gangrene, R and L heart cath (10/20/2016)    PT Frequency 2x / week   PT Duration Other (comment)  16 weeks   PT Treatment/Interventions ADLs/Self Care Home Management;Neuromuscular re-education;Balance training;Therapeutic exercise;Therapeutic activities;Functional mobility training;Stair training;Gait training;DME Instruction;Patient/family education;Prosthetic Training   PT Next Visit Plan continue prosthetic wear/care education, progress prosthetic gait training; continue to monitor wounds on bilateral LEs    Consulted and Agree with Plan of Care Patient;Family member/caregiver   Family Member Consulted Wife - [name]       Patient will benefit from skilled therapeutic intervention in order to improve the following deficits and impairments:  Abnormal gait, Decreased activity tolerance, Decreased balance, Decreased coordination, Decreased range of motion, Decreased mobility, Decreased knowledge of use of DME, Decreased knowledge of precautions, Decreased endurance, Decreased skin integrity, Decreased scar mobility, Decreased strength, Difficulty walking, Postural dysfunction, Prosthetic Dependency  Visit Diagnosis: Unsteadiness on feet  Other abnormalities of gait and mobility  Abnormal posture  Muscle weakness (generalized)     Problem List Patient Active Problem List   Diagnosis Date Noted  . Amputation of both hands with complication, subsequent encounter 03/01/2017  . Diabetes ([place]) 02/19/2017  . Status post bilateral below knee amputation ([place]) 02/09/2017  . Wound disruption, post-op, skin, sequela 02/09/2017  . Debility 02/05/2017  . Ganglion upper arm, right   . Thrombosis of both upper extremities   . Suspected heparin induced  thrombocytopenia (HIT) in hospitalized patient ([place])   . Gangrene of lower extremity ([place])   . Heparin induced thrombocytopenia (HIT) ([place])   . Tracheostomy status ([place])   . Chest tube in place   . Atherosclerosis of native arteries of extremities with gangrene, left leg ([place])   . Atherosclerosis of native arteries of extremities with gangrene, right leg ([place])   . Acute on chronic diastolic CHF (congestive heart failure) ([place])   . Enteritis due to Clostridium difficile   . Anasarca   . FUO (fever of unknown origin)   . Acute encephalopathy   . Elevated LFTs   . DIC (disseminated intravascular coagulation) ([place]) 10/28/2016  . Postoperative atrial fibrillation ([place]) 10/24/2016  . Cardiogenic shock ([place])   . Mitral regurgitation due to cusp prolapse 10/22/2016  . S/P aortic valve replacement with bioprosthetic valve 10/22/2016  . S/P ascending aortic aneurysm repair 10/22/2016  . S/P mitral valve repair 10/22/2016  . S/P CABG x 3 10/22/2016  . Thrombocytopenia ([place]) 10/22/2016  . Acute combined systolic and diastolic congestive heart failure ([place]) 10/22/2016  . Acute respiratory failure ([place]) 10/22/2016  . Thoracic ascending aortic aneurysm ([place]) 10/21/2016  . Coronary artery disease involving native coronary artery of native heart with unstable angina pectoris ([place]) 10/20/2016  . Severe aortic stenosis by prior echocardiography 10/19/2016  . Unstable angina ([place]) 10/19/2016  . Aortic valve stenosis 10/19/2016  . Bicuspid aortic valve 10/19/2016  . Trigger ring finger of right hand   . Wears glasses   . Gout   . Hypertension   . Carpal tunnel syndrome of right wrist   . Arthritis   . Snores     [place], SPT  04/30/2017,  8:04 AM  [facility] [address] [place], [place], [address] Phone: [phone]   Fax:  [phone]  Name: Scott [name] MRN: [id number] Date of Birth: 11/04/1946

## 2017-04-29 NOTE — Telephone Encounter (Signed)
Dr. Sharol Given, Mr. Whedbee needs a rolling walker for safe gait with bilateral Transtibial Prostheses. Can you please write a prescription for rolling walker and place in EPIC or FAX to [phone]? Thank you Jamey Reas, PT, DPT

## 2017-04-29 NOTE — Telephone Encounter (Signed)
Pt aware.

## 2017-04-29 NOTE — Patient Instructions (Addendum)
Do each exercise 2  times per day Do each exercise 10 repetitions Hold each exercise for 3 seconds to feel your location  AT SINK FIND YOUR MIDLINE POSITION AND PLACE FEET EQUAL DISTANCE FROM THE MIDLINE.  USE TAPE ON FLOOR TO MARK THE MIDLINE POSITION. You also should try to feel with your limb pressure in socket.  You are trying to feel with limb what you used to feel with the bottom of your foot.  1. Side to Side Shift: Moving your hips only (not shoulders): move weight onto your left leg, HOLD/FEEL.  Move back to equal weight on each leg, HOLD/FEEL. Move weight onto your right leg, HOLD/FEEL. Move back to equal weight on each leg, HOLD/FEEL. Repeat. 2. Front to Back Shift: Moving your hips only (not shoulders): move your weight forward onto your toes, HOLD/FEEL. Move your weight back to equal Flat Foot on both legs, HOLD/FEEL. Move your weight back onto your heels, HOLD/FEEL. Move your weight back to equal on both legs, HOLD/FEEL. Repeat. 3. Moving Cones / Cups: With equal weight on each leg: Hold on with one hand the first time, then progress to no hand supports. Move cups from one side of sink to the other. Place cups ~2" out of your reach, progress to 10" beyond reach. 4. Overhead/Upward Reaching: alternated reaching up to top cabinets or ceiling if no cabinets present. Keep equal weight on each leg. Start with one hand support on counter while other hand reaches and progress to no hand support with reaching. 5.   Looking Over Shoulders: With equal weight on each leg: alternate turning to look over your shoulders with one hand support on counter as needed. Shift weight to             side looking, pull hip then shoulder then head/eyes around to look behind you. Start with one hand support & progress to no hand support. 

## 2017-04-29 NOTE — Telephone Encounter (Signed)
-----   Message from Arnoldo Lenis, MD sent at 04/23/2017  1:31 PM EDT ----- I would defer to his pcp if he needs to stay on allopurinol.  J BrancH MD ----- Message ----- From: Malen Gauze, RN Sent: 04/22/2017  11:38 AM To: Arnoldo Lenis, MD, Massie Maroon, CMA  Saw pt today for INR check.  Wife states pt is going for rehab at [facility] 3 x week in [place].  Pt has a history of gout and Allopurinol was stopped in the hospital.  Therapist told wife, in her experience, gout would usually come back.  Wife wants to know should he go back on Allopurinol.  Has med at home.

## 2017-04-29 NOTE — Telephone Encounter (Signed)
Prescription written in Epic. Thanks.

## 2017-05-03 ENCOUNTER — Encounter (INDEPENDENT_AMBULATORY_CARE_PROVIDER_SITE_OTHER): Payer: Self-pay | Admitting: Orthopedic Surgery

## 2017-05-03 ENCOUNTER — Ambulatory Visit (INDEPENDENT_AMBULATORY_CARE_PROVIDER_SITE_OTHER): Payer: Medicare Other | Admitting: Orthopedic Surgery

## 2017-05-03 VITALS — Ht 61.0 in | Wt 170.0 lb

## 2017-05-03 DIAGNOSIS — I2510 Atherosclerotic heart disease of native coronary artery without angina pectoris: Secondary | ICD-10-CM

## 2017-05-03 DIAGNOSIS — S68411D Complete traumatic amputation of right hand at wrist level, subsequent encounter: Secondary | ICD-10-CM | POA: Diagnosis not present

## 2017-05-03 DIAGNOSIS — S68412D Complete traumatic amputation of left hand at wrist level, subsequent encounter: Secondary | ICD-10-CM

## 2017-05-03 DIAGNOSIS — Z89512 Acquired absence of left leg below knee: Secondary | ICD-10-CM | POA: Diagnosis not present

## 2017-05-03 DIAGNOSIS — Z89511 Acquired absence of right leg below knee: Secondary | ICD-10-CM | POA: Diagnosis not present

## 2017-05-03 MED ORDER — GABAPENTIN 300 MG PO CAPS: 300.0000 mg | ORAL_CAPSULE | Freq: Three times a day (TID) | ORAL | 1 refills | Status: DC

## 2017-05-03 NOTE — Progress Notes (Signed)
Office Visit Note   Patient: Scott [name]           Date of Birth: 1946-01-05           MRN: [id number] Visit Date: 05/03/2017              Requested by: Sasser, Silvestre Moment, MD [place], [place] [address] PCP: Manon Hilding, MD  Chief Complaint  Patient presents with  . Right Leg - Follow-up    12/11/16 bilateral BKA  . Left Leg - Follow-up      HPI: The patient is a 71 year old gentleman who presents today status post bilateral below the knee amputations as well as bilateral hand amputation on 12/11/16. Has been in the [employer] compression shrinkers. Has been gait training with [facility]. [employer] is also fabricating some hand prostheses.   Has increased his Gabapentin at last visit. Did not notice improvement in phantom pain control. Does however state having phantom pains about once weekly. Continues to have pain, aching and soreness with use to the second [place] head, is using this and his thumb for ADLs.   Assessment & Plan: Visit Diagnoses:  1. Status post bilateral below knee amputation ([place])   2. Amputation of both hands with complication, subsequent encounter     Plan: Continue to follow with [employer] for fabrication of prostheses and with [facility] for gait training. Will back down to 300 mg Neurontin tid. Continue shrinkers with when not in prosthetics. Follow up in office in 3 months.  Follow-Up Instructions: Return in about 3 months (around 08/03/2017).   Ortho Exam  Patient is alert, oriented, no adenopathy, well-dressed, normal affect, normal respiratory effort. Bilateral hand amputations are well healed. The right hand distal 2nd MC is tender to deep palpation, no erythema or swelling. No open areas. Is well healed. Does have some scar tissue. Bilateral below knee amputations are well healed and well consolidated. Abrasion to distal right BKA from a fall. No surrounding erythema or drainage. No sign of infection.   Imaging: No results found.  Labs: Lab Results  Component  Value Date   HGBA1C 6.6 (H) 10/21/2016   REPTSTATUS 02/06/2017 FINAL 02/01/2017   GRAMSTAIN  02/01/2017    ABUNDANT WBC PRESENT, PREDOMINANTLY PMN MODERATE GRAM NEGATIVE RODS    CULT  02/01/2017    FEW PSEUDOMONAS AERUGINOSA MODERATE BACTEROIDES FRAGILIS BETA LACTAMASE POSITIVE    LABORGA PSEUDOMONAS AERUGINOSA 02/01/2017    Orders:  No orders of the defined types were placed in this encounter.  No orders of the defined types were placed in this encounter.    Procedures: No procedures performed  Clinical Data: No additional findings.  ROS:  All other systems negative, except as noted in the HPI. Review of Systems  Constitutional: Negative for chills and fever.  Musculoskeletal: Positive for arthralgias.  Skin: Negative for color change and wound.    Objective: Vital Signs: Ht 5\' 1"  (1.549 m)   Wt 170 lb (77.1 kg)   BMI 32.12 kg/m   Specialty Comments:  No specialty comments available.  PMFS History: Patient Active Problem List   Diagnosis Date Noted  . Amputation of both hands with complication, subsequent encounter 03/01/2017  . Diabetes ([place]) 02/19/2017  . Status post bilateral below knee amputation ([place]) 02/09/2017  . Wound disruption, post-op, skin, sequela 02/09/2017  . Debility 02/05/2017  . Ganglion upper arm, right   . Thrombosis of both upper extremities   . Suspected heparin induced thrombocytopenia (HIT) in hospitalized  patient ([place])   . Gangrene of lower extremity ([place])   . Heparin induced thrombocytopenia (HIT) ([place])   . Tracheostomy status ([place])   . Chest tube in place   . Atherosclerosis of native arteries of extremities with gangrene, left leg ([place])   . Atherosclerosis of native arteries of extremities with gangrene, right leg ([place])   . Acute on chronic diastolic CHF (congestive heart failure) ([place])   . Enteritis due to Clostridium difficile   . Anasarca   . FUO (fever of unknown origin)   . Acute encephalopathy   . Elevated LFTs   .  DIC (disseminated intravascular coagulation) ([place]) 10/28/2016  . Postoperative atrial fibrillation ([place]) 10/24/2016  . Cardiogenic shock ([place])   . Mitral regurgitation due to cusp prolapse 10/22/2016  . S/P aortic valve replacement with bioprosthetic valve 10/22/2016  . S/P ascending aortic aneurysm repair 10/22/2016  . S/P mitral valve repair 10/22/2016  . S/P CABG x 3 10/22/2016  . Thrombocytopenia ([place]) 10/22/2016  . Acute combined systolic and diastolic congestive heart failure ([place]) 10/22/2016  . Acute respiratory failure ([place]) 10/22/2016  . Thoracic ascending aortic aneurysm ([place]) 10/21/2016  . Coronary artery disease involving native coronary artery of native heart with unstable angina pectoris ([place]) 10/20/2016  . Severe aortic stenosis by prior echocardiography 10/19/2016  . Unstable angina ([place]) 10/19/2016  . Aortic valve stenosis 10/19/2016  . Bicuspid aortic valve 10/19/2016  . Trigger ring finger of right hand   . Wears glasses   . Gout   . Hypertension   . Carpal tunnel syndrome of right wrist   . Arthritis   . Snores    Past Medical History:  Diagnosis Date  . Arthritis    "hips, shoulders; knees; back" (10/20/2016)  . Bicuspid aortic valve   . BPH (benign prostatic hypertrophy)   . Carpal tunnel syndrome of right wrist   . Coronary artery disease involving native coronary artery of native heart with unstable angina pectoris ([place]) 10/20/2016  . Diverticulitis   . GERD (gastroesophageal reflux disease)   . Gout   . Heart murmur   . Hyperlipemia   . Hypertension   . Postoperative atrial fibrillation ([place]) 10/24/2016  . RLS (restless legs syndrome)   . S/P aortic valve replacement with bioprosthetic valve 10/22/2016   25 mm [facility] Ease bovine pericardial bioprosthetic tissue valve  . S/P ascending aortic aneurysm repair 10/22/2016   28 mm supracoronary straight graft replacement of ascending thoracic aortic aneurysm  . S/P CABG x 3 10/22/2016    Sequential LIMA to Diag and LAD, SVG to distal LAD, open vein harvest right thigh  . S/P mitral valve repair 10/22/2016   Artificial Gore-tex neochord placement x6 - posterior annuloplasty band placed but removed due to systolic anterior motion of mitral valve  . Severe aortic stenosis   . Snores    Never been tested for sleep apnea  . Thoracic ascending aortic aneurysm ([place]) 10/21/2016  . Thrombocytopenia ([place]) 10/22/2016  . Type II diabetes mellitus ([facility])     Family History  Problem Relation Age of Onset  . Lung cancer Mother   . Clotting disorder Father        No details  . Heart disease Sister 57       Stents  . Cancer Sister        Throat    Past Surgical History:  Procedure Laterality Date  . AMPUTATION Bilateral 12/11/2016   Procedure: AMPUTATION BELOW KNEE BILATERALLY;  Surgeon: Meridee Score  V, MD;  Location: [facility];  Service: Orthopedics;  Laterality: Bilateral;  . AMPUTATION Bilateral 12/11/2016   Procedure: AMPUTATION BILATERAL HANDS EXCEPT RIGHT THUMB;  Surgeon: [name], MD;  Location: [facility];  Service: Orthopedics;  Laterality: Bilateral;  . AORTIC VALVE REPLACEMENT N/A 10/22/2016   Procedure: AORTIC VALVE REPLACEMENT (AVR) WITH SIZE 25 MM MAGNA EASE PERICARDIAL BIOPROSTHESIS - AORTIC;  Surgeon: Rexene Alberts, MD;  Location: [facility];  Service: Open Heart Surgery;  Laterality: N/A;  . BONE EXCISION Right 02/01/2017   Procedure: EXCISION RIGHT INDEX METACARPAL HEAD;  Surgeon: [name], MD;  Location: [facility];  Service: Orthopedics;  Laterality: Right;  . CARDIAC CATHETERIZATION N/A 10/20/2016   Procedure: Right/Left Heart Cath and Coronary Angiography;  Surgeon: Leonie Man, MD;  Location: [facility];  Service: Cardiovascular;  Laterality: N/A;  . CARPAL TUNNEL RELEASE Right 11/28/2013   Procedure: RIGHT WRIST CARPAL TUNNEL RELEASE;  Surgeon: Lorn Junes, MD;  Location: [facility];  Service: Orthopedics;  Laterality: Right;  . CATARACT  EXTRACTION W/ INTRAOCULAR LENS  IMPLANT, BILATERAL Bilateral 1978  . COLONOSCOPY    . CORONARY ARTERY BYPASS GRAFT N/A 10/22/2016   Procedure: CORONARY ARTERY BYPASS GRAFTING (CABG)x 2 WITH LIMA TO DIAGONAL, OPEN  HARVESTING OF RIGHT SAPHENOUS VEIN FOR VEIN GRAFT TO LAD;  Surgeon: Rexene Alberts, MD;  Location: [facility];  Service: Open Heart Surgery;  Laterality: N/A;  . FRACTURE SURGERY    . INGUINAL HERNIA REPAIR Right 1998  . IR GENERIC HISTORICAL  12/07/2016   IR US GUIDE VASC ACCESS RIGHT 12/07/2016 Aletta Edouard, MD [facility]  . IR GENERIC HISTORICAL  12/07/2016   IR RADIOLOGY PERIPHERAL GUIDED IV START 12/07/2016 Aletta Edouard, MD [facility]  . IR GENERIC HISTORICAL  12/07/2016   IR GASTROSTOMY TUBE MOD SED 12/07/2016 Aletta Edouard, MD [facility]  . LIPOMA EXCISION Right 2008   "side of my head"  . MITRAL VALVE REPAIR N/A 10/22/2016   Procedure: MITRAL VALVE REPAIR (MVR) WITH SIZE 30 SORIN ANNULOFLEX ANNULOPLASTY RING WITH SUBSEQUENT REMOVAL OF RING;  Surgeon: Rexene Alberts, MD;  Location: [facility];  Service: Open Heart Surgery;  Laterality: N/A;  . PENECTOMY  2007   Peyronie's disease   . PENILE PROSTHESIS IMPLANT  2009  . REMOVAL OF PENILE PROSTHESIS N/A 02/01/2017   Procedure: REMOVAL OF PENILE PROSTHESIS;  Surgeon: Kathie Rhodes, MD;  Location: [facility];  Service: Urology;  Laterality: N/A;  . SHOULDER OPEN ROTATOR CUFF REPAIR Right 2006  . STERNAL CLOSURE N/A 10/26/2016   Procedure: STERNAL WASHOUT AND DELAYED PRIMARY CLOSURE;  Surgeon: Rexene Alberts, MD;  Location: [facility];  Service: Thoracic;  Laterality: N/A;  . TEE WITHOUT CARDIOVERSION N/A 10/26/2016   Procedure: TRANSESOPHAGEAL ECHOCARDIOGRAM (TEE);  Surgeon: Rexene Alberts, MD;  Location: [facility];  Service: Thoracic;  Laterality: N/A;  . TEE WITHOUT CARDIOVERSION N/A 10/22/2016   Procedure: TRANSESOPHAGEAL ECHOCARDIOGRAM (TEE);  Surgeon: Rexene Alberts, MD;  Location: [facility];  Service: Open Heart Surgery;  Laterality: N/A;    . THORACIC AORTIC ANEURYSM REPAIR  10/22/2016   Procedure: ASCENDING AORTIC  ANEURYSM REPAIR (AAA) WITH 28 MM HEMASHIELD PLATINUM WOVEN DOUBLE VELOUR VASCULAR GRAFT;  Surgeon: Rexene Alberts, MD;  Location: [facility];  Service: Open Heart Surgery;;  . TONSILLECTOMY  ~ 1955  . TRACHEOSTOMY TUBE PLACEMENT N/A 11/09/2016   Procedure: TRACHEOSTOMY;  Surgeon: Rexene Alberts, MD;  Location: [facility];  Service:  Thoracic;  Laterality: N/A;  . TRANSURETHRAL RESECTION OF PROSTATE  2005  . TRIGGER FINGER RELEASE Bilateral    several lt and rt hands  . TRIGGER FINGER RELEASE Right 11/28/2013   Procedure: RIGHT TRIGGER FINGER  RELEASE (TENDON SHEATH INCISION);  Surgeon: Lorn Junes, MD;  Location: [facility];  Service: Orthopedics;  Laterality: Right;  Marland Kitchen VIDEO BRONCHOSCOPY N/A 11/09/2016   Procedure: VIDEO BRONCHOSCOPY;  Surgeon: Rexene Alberts, MD;  Location: [facility];  Service: Thoracic;  Laterality: N/A;  . WRIST FRACTURE SURGERY Right ~ 1959   Social History   Occupational History  . [employer]    retired   Social History Main Topics  . Smoking status: Former Smoker    Years: 3.00    Types: Pipe, Cigars    Quit date: 1984  . Smokeless tobacco: Never Used  . Alcohol use 6.0 oz/week    10 Cans of beer per week  . Drug use: No  . Sexual activity: Not Currently

## 2017-05-03 NOTE — Addendum Note (Signed)
Addended by: Dondra Prader R on: 05/03/2017 10:56 AM   Modules accepted: Orders

## 2017-05-06 ENCOUNTER — Ambulatory Visit (INDEPENDENT_AMBULATORY_CARE_PROVIDER_SITE_OTHER): Payer: Medicare Other | Admitting: *Deleted

## 2017-05-06 DIAGNOSIS — Q231 Congenital insufficiency of aortic valve: Secondary | ICD-10-CM | POA: Diagnosis not present

## 2017-05-06 DIAGNOSIS — Z9889 Other specified postprocedural states: Secondary | ICD-10-CM | POA: Diagnosis not present

## 2017-05-06 DIAGNOSIS — I9789 Other postprocedural complications and disorders of the circulatory system, not elsewhere classified: Secondary | ICD-10-CM | POA: Diagnosis not present

## 2017-05-06 DIAGNOSIS — I2510 Atherosclerotic heart disease of native coronary artery without angina pectoris: Secondary | ICD-10-CM

## 2017-05-06 DIAGNOSIS — I4891 Unspecified atrial fibrillation: Secondary | ICD-10-CM

## 2017-05-06 DIAGNOSIS — I82603 Acute embolism and thrombosis of unspecified veins of upper extremity, bilateral: Secondary | ICD-10-CM | POA: Diagnosis not present

## 2017-05-06 LAB — POCT INR: INR: 3.1

## 2017-05-10 ENCOUNTER — Encounter: Payer: Self-pay | Admitting: Physical Therapy

## 2017-05-10 ENCOUNTER — Telehealth: Payer: Self-pay | Admitting: Physical Therapy

## 2017-05-10 ENCOUNTER — Ambulatory Visit: Payer: Medicare Other | Admitting: Physical Therapy

## 2017-05-10 ENCOUNTER — Other Ambulatory Visit (INDEPENDENT_AMBULATORY_CARE_PROVIDER_SITE_OTHER): Payer: Self-pay | Admitting: Orthopedic Surgery

## 2017-05-10 DIAGNOSIS — R2681 Unsteadiness on feet: Secondary | ICD-10-CM | POA: Diagnosis not present

## 2017-05-10 DIAGNOSIS — M6281 Muscle weakness (generalized): Secondary | ICD-10-CM

## 2017-05-10 DIAGNOSIS — R29898 Other symptoms and signs involving the musculoskeletal system: Secondary | ICD-10-CM | POA: Diagnosis not present

## 2017-05-10 DIAGNOSIS — R293 Abnormal posture: Secondary | ICD-10-CM

## 2017-05-10 DIAGNOSIS — R208 Other disturbances of skin sensation: Secondary | ICD-10-CM | POA: Diagnosis not present

## 2017-05-10 DIAGNOSIS — S48912S Complete traumatic amputation of left shoulder and upper arm, level unspecified, sequela: Principal | ICD-10-CM

## 2017-05-10 DIAGNOSIS — S48911S Complete traumatic amputation of right shoulder and upper arm, level unspecified, sequela: Secondary | ICD-10-CM

## 2017-05-10 DIAGNOSIS — R2689 Other abnormalities of gait and mobility: Secondary | ICD-10-CM

## 2017-05-10 NOTE — Telephone Encounter (Signed)
Dr. Sharol Given Mr. Lolli is got his right UE prosthesis last week and is getting his left UE one soon. Can you please write an order for Occupational Therapy Evaluation & treatment? Thank you Jamey Reas, PT, DPT PT Specializing in [place] @6 /18/2018@ 11:22 AM Phone:  [phone]  Fax:  [phone] [facility] [address] [place], [place] [address]

## 2017-05-10 NOTE — Therapy (Signed)
[facility] [address] [place], [place], [address] Phone: [phone]   Fax:  [phone]  Physical Therapy Treatment  Patient Details  Name: Scott [name] MRN: [id number] Date of Birth: [birth year] Referring Provider: Meridee Score MD   Encounter Date: 05/10/2017      PT End of Session - 05/10/17 1108    Visit Number 4   Number of Visits 33   Date for PT Re-Evaluation 06/18/17   Authorization Type Medicare & G-codes every 10th visit    PT Start Time 1102   PT Stop Time 1145   PT Time Calculation (min) 43 min   Equipment Utilized During Treatment Gait belt   Activity Tolerance Patient tolerated treatment well   Behavior During Therapy [facility] for tasks assessed/performed      Past Medical History:  Diagnosis Date  . Arthritis    "hips, shoulders; knees; back" (10/20/2016)  . Bicuspid aortic valve   . BPH (benign prostatic hypertrophy)   . Carpal tunnel syndrome of right wrist   . Coronary artery disease involving native coronary artery of native heart with unstable angina pectoris ([place]) 10/20/2016  . Diverticulitis   . GERD (gastroesophageal reflux disease)   . Gout   . Heart murmur   . Hyperlipemia   . Hypertension   . Postoperative atrial fibrillation ([place]) 10/24/2016  . RLS (restless legs syndrome)   . S/P aortic valve replacement with bioprosthetic valve 10/22/2016   25 mm [facility] Ease bovine pericardial bioprosthetic tissue valve  . S/P ascending aortic aneurysm repair 10/22/2016   28 mm supracoronary straight graft replacement of ascending thoracic aortic aneurysm  . S/P CABG x 3 10/22/2016   Sequential LIMA to Diag and LAD, SVG to distal LAD, open vein harvest right thigh  . S/P mitral valve repair 10/22/2016   Artificial Gore-tex neochord placement x6 - posterior annuloplasty band placed but removed due to systolic anterior motion of mitral valve  . Severe aortic stenosis   . Snores    Never  been tested for sleep apnea  . Thoracic ascending aortic aneurysm ([place]) 10/21/2016  . Thrombocytopenia ([place]) 10/22/2016  . Type II diabetes mellitus ([place])     Past Surgical History:  Procedure Laterality Date  . AMPUTATION Bilateral 12/11/2016   Procedure: AMPUTATION BELOW KNEE BILATERALLY;  Surgeon: [name], MD;  Location: [place];  Service: Orthopedics;  Laterality: Bilateral;  . AMPUTATION Bilateral 12/11/2016   Procedure: AMPUTATION BILATERAL HANDS EXCEPT RIGHT THUMB;  Surgeon: [name], MD;  Location: [place];  Service: Orthopedics;  Laterality: Bilateral;  . AORTIC VALVE REPLACEMENT N/A 10/22/2016   Procedure: AORTIC VALVE REPLACEMENT (AVR) WITH SIZE 25 MM MAGNA EASE PERICARDIAL BIOPROSTHESIS - AORTIC;  Surgeon: Rexene Alberts, MD;  Location: [place];  Service: Open Heart Surgery;  Laterality: N/A;  . BONE EXCISION Right 02/01/2017   Procedure: EXCISION RIGHT INDEX METACARPAL HEAD;  Surgeon: [name], MD;  Location: [place];  Service: Orthopedics;  Laterality: Right;  . CARDIAC CATHETERIZATION N/A 10/20/2016   Procedure: Right/Left Heart Cath and Coronary Angiography;  Surgeon: Leonie Man, MD;  Location: [facility];  Service: Cardiovascular;  Laterality: N/A;  . CARPAL TUNNEL RELEASE Right 11/28/2013   Procedure: RIGHT WRIST CARPAL TUNNEL RELEASE;  Surgeon: Lorn Junes, MD;  Location: [facility];  Service: Orthopedics;  Laterality: Right;  . CATARACT EXTRACTION W/ INTRAOCULAR LENS  IMPLANT, BILATERAL Bilateral 1978  . COLONOSCOPY    .  CORONARY ARTERY BYPASS GRAFT N/A 10/22/2016   Procedure: CORONARY ARTERY BYPASS GRAFTING (CABG)x 2 WITH LIMA TO DIAGONAL, OPEN  HARVESTING OF RIGHT SAPHENOUS VEIN FOR VEIN GRAFT TO LAD;  Surgeon: Rexene Alberts, MD;  Location: [place];  Service: Open Heart Surgery;  Laterality: N/A;  . FRACTURE SURGERY    . INGUINAL HERNIA REPAIR Right 1998  . IR GENERIC HISTORICAL  12/07/2016   IR US GUIDE VASC ACCESS RIGHT 12/07/2016  Aletta Edouard, MD [facility]  . IR GENERIC HISTORICAL  12/07/2016   IR RADIOLOGY PERIPHERAL GUIDED IV START 12/07/2016 Aletta Edouard, MD [facility]  . IR GENERIC HISTORICAL  12/07/2016   IR GASTROSTOMY TUBE MOD SED 12/07/2016 Aletta Edouard, MD [facility]  . LIPOMA EXCISION Right 2008   "side of my head"  . MITRAL VALVE REPAIR N/A 10/22/2016   Procedure: MITRAL VALVE REPAIR (MVR) WITH SIZE 30 SORIN ANNULOFLEX ANNULOPLASTY RING WITH SUBSEQUENT REMOVAL OF RING;  Surgeon: Rexene Alberts, MD;  Location: [place];  Service: Open Heart Surgery;  Laterality: N/A;  . PENECTOMY  2007   Peyronie's disease   . PENILE PROSTHESIS IMPLANT  2009  . REMOVAL OF PENILE PROSTHESIS N/A 02/01/2017   Procedure: REMOVAL OF PENILE PROSTHESIS;  Surgeon: Kathie Rhodes, MD;  Location: [place];  Service: Urology;  Laterality: N/A;  . SHOULDER OPEN ROTATOR CUFF REPAIR Right 2006  . STERNAL CLOSURE N/A 10/26/2016   Procedure: STERNAL WASHOUT AND DELAYED PRIMARY CLOSURE;  Surgeon: Rexene Alberts, MD;  Location: [place];  Service: Thoracic;  Laterality: N/A;  . TEE WITHOUT CARDIOVERSION N/A 10/26/2016   Procedure: TRANSESOPHAGEAL ECHOCARDIOGRAM (TEE);  Surgeon: Rexene Alberts, MD;  Location: [place];  Service: Thoracic;  Laterality: N/A;  . TEE WITHOUT CARDIOVERSION N/A 10/22/2016   Procedure: TRANSESOPHAGEAL ECHOCARDIOGRAM (TEE);  Surgeon: Rexene Alberts, MD;  Location: [place];  Service: Open Heart Surgery;  Laterality: N/A;  . THORACIC AORTIC ANEURYSM REPAIR  10/22/2016   Procedure: ASCENDING AORTIC  ANEURYSM REPAIR (AAA) WITH 28 MM HEMASHIELD PLATINUM WOVEN DOUBLE VELOUR VASCULAR GRAFT;  Surgeon: Rexene Alberts, MD;  Location: [place];  Service: Open Heart Surgery;;  . TONSILLECTOMY  ~ 1955  . TRACHEOSTOMY TUBE PLACEMENT N/A 11/09/2016   Procedure: TRACHEOSTOMY;  Surgeon: Rexene Alberts, MD;  Location: [facility];  Service: Thoracic;  Laterality: N/A;  . TRANSURETHRAL RESECTION OF PROSTATE  2005  . TRIGGER FINGER RELEASE  Bilateral    several lt and rt hands  . TRIGGER FINGER RELEASE Right 11/28/2013   Procedure: RIGHT TRIGGER FINGER  RELEASE (TENDON SHEATH INCISION);  Surgeon: Lorn Junes, MD;  Location: [facility] [place];  Service: Orthopedics;  Laterality: Right;  Marland Kitchen VIDEO BRONCHOSCOPY N/A 11/09/2016   Procedure: VIDEO BRONCHOSCOPY;  Surgeon: Rexene Alberts, MD;  Location: [facility] [place];  Service: Thoracic;  Laterality: N/A;  . WRIST FRACTURE SURGERY Right ~ 1959    There were no vitals filed for this visit.      Subjective Assessment - 05/10/17 1108    Subjective No new complaints. No falls or pain to report.    Patient is accompained by: Family member  wife, daughter, grand-daughter   Pertinent History arthritis, bicuspid aortic valve, BPH, carpal tunnel syndrome in R wrist, CAD, diverticulitis, GERD, gout, hyperlipidemia, HTN, DM type II,thrombocytopenia, thoracic ascending aortic aneurysm, severe aortic stenosis, amputation of bilateral hands except right thumb (12/11/2016) due to gangrene, bilateral transtibial amputations (12/11/2016) due to gangrene, R and L heart cath (10/20/2016)  Limitations Standing;Walking;House hold activities   Patient Stated Goals use prostheses to walk including stairs, outdoors, target shooting, drive   Currently in Pain? No/denies             [facility] Adult PT Treatment/Exercise - 05/10/17 1109      Transfers   Transfers Sit to Stand;Stand to Sit   Sit to Stand 4: Min assist;With upper extremity assist;From chair/3-in-1   Sit to Stand Details Verbal cues for sequencing;Verbal cues for technique;Verbal cues for precautions/safety   Sit to Stand Details (indicate cue type and reason) from wheelchair to walker/sink. reminder cues needed on technique   Stand to Sit 4: Min assist;With upper extremity assist;With armrests;To chair/3-in-1   Stand to Sit Details (indicate cue type and reason) Verbal cues for sequencing;Verbal cues for technique;Verbal cues for  precautions/safety   Stand to Sit Details cues on technique     Ambulation/Gait   Ambulation/Gait Yes   Ambulation/Gait Assistance 4: Min assist;3: Mod assist   Ambulation/Gait Assistance Details cues on posture, step length/placement and to decrease base of support with gait. cues/assistance for walker management and weight shifting as well.    Ambulation Distance (Feet) 120 Feet  x2   Assistive device Left platform walker;Prostheses  right hand orthotic   Gait Pattern Step-to pattern;Decreased step length - right;Decreased step length - left;Decreased stride length;Right flexed knee in stance;Left flexed knee in stance;Trunk flexed;Narrow base of support   Ambulation Surface Indoor;Level     Neuro Re-ed    Neuro Re-ed Details  standing at sink with single UE support (right): feet hip width apart- EC no head movements for 20 sec's x 3 reps, EO head movements left<>right and up<>down x 10 reps each, progressing to EC head movements left<>right and up<>down x 10 reps each. min guard to min assist for balance.,      Prosthetics   Prosthetic Care Comments  pt to increase to 3 hours on, 2 hours off for 3 cycles as able.    Current prosthetic wear tolerance (#hours/day)  2 hours 3x day   Residual limb condition  no drainage noted. bil limb wounds all appear to have scabs on them. did not use    Education Provided Proper wear schedule/adjustment;Proper weight-bearing schedule/adjustment;Residual limb care   Person(s) Educated Patient;Spouse;Child(ren)   Education Method Explanation;Demonstration;Verbal cues   Education Method Verbalized understanding;Verbal cues required;Needs further instruction   Donning Prosthesis Minimal assist   Doffing Prosthesis Minimal assist             PT Short Term Goals - 04/28/17 1637      PT SHORT TERM GOAL #1   Title Patient and patient's caregiver (due to patient's bilateral hand amputations) will verbalize understanding of proper cleaning, donning,  and doffing of prostheses. (TARGET DATE: 05/21/2017)    Time 4   Period Weeks   Status On-going     PT SHORT TERM GOAL #2   Title Patient will tolerate wear 7 days/week for 6hrs total per day without signs of decline in skin integrity issues. (TARGET DATE: 05/21/2017)    Time 4   Period Weeks   Status On-going     PT SHORT TERM GOAL #3   Title Patient will demonstrate static standing balance for 2 minutes and stationery dynamic of head turns to scan with prostheses and unilateral UE support and supervision to indicate an increase in balance and a decrease in falls risk. (TARGET DATE: 05/21/2017)    Time 4   Period [place]  Status On-going     PT SHORT TERM GOAL #4   Title Patient will demonstrate ability to reach anteriorly 5 inches and to knee level towards the ground with unilateral UE support without a loss of balance to indicate a decrease in his risk of falling. (TARGET DATE: 05/21/2017)    Time 4   Period Weeks   Status On-going     PT SHORT TERM GOAL #5   Title Patient will ambulate 200 feet with min guard with bilateral platform RW to indicate a decrease in his risk of falling. (TARGET DATE: 05/21/2017)    Time 4   Period Weeks   Status On-going           PT Long Term Goals - 04/28/17 1638      PT LONG TERM GOAL #1   Title Patient modified independently donnes & doffes, adjusts ply socks and pt / wife verbalize understanding of proper prosthetic care to ensure safe use of prostheses.  (Target Date: 08/20/2017)   Time 4   Period Months   Status On-going     PT LONG TERM GOAL #2   Title Patient will tolerate prostheses wear >90% of awake hours without skin issues or limb pain to demonstrate a decrease in his risk of falling. (Target Date: 08/20/2017)   Time 4   Period Months   Status On-going     PT LONG TERM GOAL #3   Title Patient performs standing balance activities with intermittent UE support reaching 10", picking up objects from floor and looks over shoulders with  weight shift, trunk rotation with prostheses independently. (Target Date: 08/20/2017)     Time 4   Period Weeks   Status On-going     PT LONG TERM GOAL #4   Title Patient will ambulate 1000 feet including outdoor surfaces with LRAD and prostheses modified independently to enable community mobility.  (Target Date: 08/20/2017)   Time 4   Period Months   Status On-going     PT LONG TERM GOAL #5   Title Patient will negotiate ramp/curbs and stairs with LRAD and prostheses modified independent for community access. (Target Date: 08/20/2017)   Time 4   Period Months   Status On-going     PT LONG TERM GOAL #6   Title Patient's gait velocity will be >/= 1.89ft/s to indicate a limited community ambulator. (Target Date: 08/20/2017)   Time 4   Period Weeks   Status On-going     PT LONG TERM GOAL #7   Title Patient reports Activities of Balance Confindence score using FOTO >25% to indicate greater confidence in his balance. (Target Date: 08/20/2017)   Time 4   Period Months   Status On-going           Plan - 05/10/17 1127    Clinical Impression Statement Today's skilled session focused on mobility/gait with prostheses/RW and on static balance with single UE support. Pt is making steady progress toward goals and should benefit from continued PT to progress toward unmet goals.    Rehab Potential Good   Clinical Impairments Affecting Rehab Potential arthritis, bicuspid aortic valve, BPH, carpal tunnel syndrome in R wrist, CAD, diverticulitis, GERD, gout, hyperlipidemia, HTN, DM type II,thrombocytopenia, thoracic ascending aortic aneurysm, severe aortic stenosis, amputation of bilateral hands except right thumb (12/11/2016) due to gangrene, bilateral transtibial amputations (12/11/2016) due to gangrene, R and L heart cath (10/20/2016)    PT Frequency 2x / week   PT Duration Other (comment)  16 weeks  PT Treatment/Interventions ADLs/Self Care Home Management;Neuromuscular re-education;Balance  training;Therapeutic exercise;Therapeutic activities;Functional mobility training;Stair training;Gait training;DME Instruction;Patient/family education;Prosthetic Training   PT Next Visit Plan continue prosthetic wear/care education, progress prosthetic gait training; continue to monitor wounds on bilateral LEs    Consulted and Agree with Plan of Care Patient;Family member/caregiver   Family Member Consulted Wife - [name]       Patient will benefit from skilled therapeutic intervention in order to improve the following deficits and impairments:  Abnormal gait, Decreased activity tolerance, Decreased balance, Decreased coordination, Decreased range of motion, Decreased mobility, Decreased knowledge of use of DME, Decreased knowledge of precautions, Decreased endurance, Decreased skin integrity, Decreased scar mobility, Decreased strength, Difficulty walking, Postural dysfunction, Prosthetic Dependency  Visit Diagnosis: Unsteadiness on feet  Other abnormalities of gait and mobility  Abnormal posture  Muscle weakness (generalized)     Problem List Patient Active Problem List   Diagnosis Date Noted  . Amputation of both hands with complication, subsequent encounter 03/01/2017  . Diabetes ([place]) 02/19/2017  . Status post bilateral below knee amputation ([place]) 02/09/2017  . Wound disruption, post-op, skin, sequela 02/09/2017  . Debility 02/05/2017  . Ganglion upper arm, right   . Thrombosis of both upper extremities   . Suspected heparin induced thrombocytopenia (HIT) in hospitalized patient ([place])   . Gangrene of lower extremity ([place])   . Heparin induced thrombocytopenia (HIT) ([place])   . Tracheostomy status ([place])   . Chest tube in place   . Atherosclerosis of native arteries of extremities with gangrene, left leg ([place])   . Atherosclerosis of native arteries of extremities with gangrene, right leg ([place])   . Acute on chronic diastolic CHF (congestive heart failure) ([place])   . Enteritis due  to Clostridium difficile   . Anasarca   . FUO (fever of unknown origin)   . Acute encephalopathy   . Elevated LFTs   . DIC (disseminated intravascular coagulation) ([place]) 10/28/2016  . Postoperative atrial fibrillation ([place]) 10/24/2016  . Cardiogenic shock ([place])   . Mitral regurgitation due to cusp prolapse 10/22/2016  . S/P aortic valve replacement with bioprosthetic valve 10/22/2016  . S/P ascending aortic aneurysm repair 10/22/2016  . S/P mitral valve repair 10/22/2016  . S/P CABG x 3 10/22/2016  . Thrombocytopenia ([place]) 10/22/2016  . Acute combined systolic and diastolic congestive heart failure ([place]) 10/22/2016  . Acute respiratory failure ([place]) 10/22/2016  . Thoracic ascending aortic aneurysm ([place]) 10/21/2016  . Coronary artery disease involving native coronary artery of native heart with unstable angina pectoris ([place]) 10/20/2016  . Severe aortic stenosis by prior echocardiography 10/19/2016  . Unstable angina ([place]) 10/19/2016  . Aortic valve stenosis 10/19/2016  . Bicuspid aortic valve 10/19/2016  . Trigger ring finger of right hand   . Wears glasses   . Gout   . Hypertension   . Carpal tunnel syndrome of right wrist   . Arthritis   . Snores     [place], [place], [facility] [address], [place], [place] [address] [phone] 05/11/17, 4:01 PM   Name: Scott [name] MRN: [id number] Date of Birth: 1946-02-07

## 2017-05-10 NOTE — Telephone Encounter (Signed)
Order written for occupational therapy for evaluation and treatment. Thank you

## 2017-05-12 ENCOUNTER — Encounter: Payer: Self-pay | Admitting: Physical Therapy

## 2017-05-12 ENCOUNTER — Ambulatory Visit: Payer: Medicare Other | Admitting: Physical Therapy

## 2017-05-12 DIAGNOSIS — R293 Abnormal posture: Secondary | ICD-10-CM | POA: Diagnosis not present

## 2017-05-12 DIAGNOSIS — R2689 Other abnormalities of gait and mobility: Secondary | ICD-10-CM

## 2017-05-12 DIAGNOSIS — R208 Other disturbances of skin sensation: Secondary | ICD-10-CM | POA: Diagnosis not present

## 2017-05-12 DIAGNOSIS — R29898 Other symptoms and signs involving the musculoskeletal system: Secondary | ICD-10-CM | POA: Diagnosis not present

## 2017-05-12 DIAGNOSIS — M6281 Muscle weakness (generalized): Secondary | ICD-10-CM

## 2017-05-12 DIAGNOSIS — R2681 Unsteadiness on feet: Secondary | ICD-10-CM

## 2017-05-12 NOTE — Therapy (Signed)
[facility] [address] [place], [place], [address] Phone: [phone]   Fax:  [phone]  Physical Therapy Treatment  Patient Details  Name: Scott [name] MRN: [id number] Date of Birth: 1946/02/09 Referring Provider: Meridee Score MD   Encounter Date: 05/12/2017      PT End of Session - 05/12/17 1410    Visit Number 5   Number of Visits 33   Date for PT Re-Evaluation 06/18/17   Authorization Type Medicare & G-codes every 10th visit    PT Start Time 87   PT Stop Time 1315   PT Time Calculation (min) 45 min   Equipment Utilized During Treatment Gait belt   Activity Tolerance Patient tolerated treatment well   Behavior During Therapy [facility] for tasks assessed/performed      Past Medical History:  Diagnosis Date  . Arthritis    "hips, shoulders; knees; back" (10/20/2016)  . Bicuspid aortic valve   . BPH (benign prostatic hypertrophy)   . Carpal tunnel syndrome of right wrist   . Coronary artery disease involving native coronary artery of native heart with unstable angina pectoris ([place]) 10/20/2016  . Diverticulitis   . GERD (gastroesophageal reflux disease)   . Gout   . Heart murmur   . Hyperlipemia   . Hypertension   . Postoperative atrial fibrillation ([place]) 10/24/2016  . RLS (restless legs syndrome)   . S/P aortic valve replacement with bioprosthetic valve 10/22/2016   25 mm [facility] Ease bovine pericardial bioprosthetic tissue valve  . S/P ascending aortic aneurysm repair 10/22/2016   28 mm supracoronary straight graft replacement of ascending thoracic aortic aneurysm  . S/P CABG x 3 10/22/2016   Sequential LIMA to Diag and LAD, SVG to distal LAD, open vein harvest right thigh  . S/P mitral valve repair 10/22/2016   Artificial Gore-tex neochord placement x6 - posterior annuloplasty band placed but removed due to systolic anterior motion of mitral valve  . Severe aortic stenosis   . Snores    Never  been tested for sleep apnea  . Thoracic ascending aortic aneurysm ([place]) 10/21/2016  . Thrombocytopenia ([place]) 10/22/2016  . Type II diabetes mellitus ([place])     Past Surgical History:  Procedure Laterality Date  . AMPUTATION Bilateral 12/11/2016   Procedure: AMPUTATION BELOW KNEE BILATERALLY;  Surgeon: [name], MD;  Location: [place];  Service: Orthopedics;  Laterality: Bilateral;  . AMPUTATION Bilateral 12/11/2016   Procedure: AMPUTATION BILATERAL HANDS EXCEPT RIGHT THUMB;  Surgeon: [name], MD;  Location: [facility];  Service: Orthopedics;  Laterality: Bilateral;  . AORTIC VALVE REPLACEMENT N/A 10/22/2016   Procedure: AORTIC VALVE REPLACEMENT (AVR) WITH SIZE 25 MM MAGNA EASE PERICARDIAL BIOPROSTHESIS - AORTIC;  Surgeon: Rexene Alberts, MD;  Location: [facility];  Service: Open Heart Surgery;  Laterality: N/A;  . BONE EXCISION Right 02/01/2017   Procedure: EXCISION RIGHT INDEX METACARPAL HEAD;  Surgeon: [name], MD;  Location: [facility];  Service: Orthopedics;  Laterality: Right;  . CARDIAC CATHETERIZATION N/A 10/20/2016   Procedure: Right/Left Heart Cath and Coronary Angiography;  Surgeon: Leonie Man, MD;  Location: [place] [facility];  Service: Cardiovascular;  Laterality: N/A;  . CARPAL TUNNEL RELEASE Right 11/28/2013   Procedure: RIGHT WRIST CARPAL TUNNEL RELEASE;  Surgeon: Lorn Junes, MD;  Location: [facility];  Service: Orthopedics;  Laterality: Right;  . CATARACT EXTRACTION W/ INTRAOCULAR LENS  IMPLANT, BILATERAL Bilateral 1978  . COLONOSCOPY    .  CORONARY ARTERY BYPASS GRAFT N/A 10/22/2016   Procedure: CORONARY ARTERY BYPASS GRAFTING (CABG)x 2 WITH LIMA TO DIAGONAL, OPEN  HARVESTING OF RIGHT SAPHENOUS VEIN FOR VEIN GRAFT TO LAD;  Surgeon: Rexene Alberts, MD;  Location: [facility];  Service: Open Heart Surgery;  Laterality: N/A;  . FRACTURE SURGERY    . INGUINAL HERNIA REPAIR Right 1998  . IR GENERIC HISTORICAL  12/07/2016   IR US GUIDE VASC ACCESS RIGHT 12/07/2016  Aletta Edouard, MD [facility]INTER[facility]  . IR GENERIC HISTORICAL  12/07/2016   IR RADIOLOGY PERIPHERAL GUIDED IV START 12/07/2016 Aletta Edouard, MD [facility]  . IR GENERIC HISTORICAL  12/07/2016   IR GASTROSTOMY TUBE MOD SED 12/07/2016 Aletta Edouard, MD [facility]  . LIPOMA EXCISION Right 2008   "side of my head"  . MITRAL VALVE REPAIR N/A 10/22/2016   Procedure: MITRAL VALVE REPAIR (MVR) WITH SIZE 30 SORIN ANNULOFLEX ANNULOPLASTY RING WITH SUBSEQUENT REMOVAL OF RING;  Surgeon: Rexene Alberts, MD;  Location: [facility];  Service: Open Heart Surgery;  Laterality: N/A;  . PENECTOMY  2007   Peyronie's disease   . PENILE PROSTHESIS IMPLANT  2009  . REMOVAL OF PENILE PROSTHESIS N/A 02/01/2017   Procedure: REMOVAL OF PENILE PROSTHESIS;  Surgeon: Kathie Rhodes, MD;  Location: [facility];  Service: Urology;  Laterality: N/A;  . SHOULDER OPEN ROTATOR CUFF REPAIR Right 2006  . STERNAL CLOSURE N/A 10/26/2016   Procedure: STERNAL WASHOUT AND DELAYED PRIMARY CLOSURE;  Surgeon: Rexene Alberts, MD;  Location: [facility];  Service: Thoracic;  Laterality: N/A;  . TEE WITHOUT CARDIOVERSION N/A 10/26/2016   Procedure: TRANSESOPHAGEAL ECHOCARDIOGRAM (TEE);  Surgeon: Rexene Alberts, MD;  Location: [place];  Service: Thoracic;  Laterality: N/A;  . TEE WITHOUT CARDIOVERSION N/A 10/22/2016   Procedure: TRANSESOPHAGEAL ECHOCARDIOGRAM (TEE);  Surgeon: Rexene Alberts, MD;  Location: [facility];  Service: Open Heart Surgery;  Laterality: N/A;  . THORACIC AORTIC ANEURYSM REPAIR  10/22/2016   Procedure: ASCENDING AORTIC  ANEURYSM REPAIR (AAA) WITH 28 MM HEMASHIELD PLATINUM WOVEN DOUBLE VELOUR VASCULAR GRAFT;  Surgeon: Rexene Alberts, MD;  Location: [facility] [place];  Service: Open Heart Surgery;;  . TONSILLECTOMY  ~ 1955  . TRACHEOSTOMY TUBE PLACEMENT N/A 11/09/2016   Procedure: TRACHEOSTOMY;  Surgeon: Rexene Alberts, MD;  Location: [facility];  Service: Thoracic;  Laterality: N/A;  . TRANSURETHRAL RESECTION OF PROSTATE  2005  . TRIGGER FINGER RELEASE  Bilateral    several lt and rt hands  . TRIGGER FINGER RELEASE Right 11/28/2013   Procedure: RIGHT TRIGGER FINGER  RELEASE (TENDON SHEATH INCISION);  Surgeon: Lorn Junes, MD;  Location: [facility];  Service: Orthopedics;  Laterality: Right;  Marland Kitchen VIDEO BRONCHOSCOPY N/A 11/09/2016   Procedure: VIDEO BRONCHOSCOPY;  Surgeon: Rexene Alberts, MD;  Location: [facility];  Service: Thoracic;  Laterality: N/A;  . WRIST FRACTURE SURGERY Right ~ 1959    There were no vitals filed for this visit.      Subjective Assessment - 05/12/17 1233    Subjective Patient denies any falls or changes since last visit. Prosthetist Richardson Landry) is present at today's appointment with B hand grips for RW.    Patient is accompained by: Family member  wife, daughter, grand-daughter   Pertinent History arthritis, bicuspid aortic valve, BPH, carpal tunnel syndrome in R wrist, CAD, diverticulitis, GERD, gout, hyperlipidemia, HTN, DM type II,thrombocytopenia, thoracic ascending aortic aneurysm, severe aortic stenosis, amputation of bilateral hands except right thumb (12/11/2016) due to gangrene, bilateral  transtibial amputations (12/11/2016) due to gangrene, R and L heart cath (10/20/2016)    Limitations Standing;Walking;House hold activities   Patient Stated Goals use prostheses to walk including stairs, outdoors, target shooting, drive   Currently in Pain? No/denies                         [facility] Adult PT Treatment/Exercise - 05/12/17 1230      Transfers   Transfers Sit to Stand;Stand to Sit   Sit to Stand 4: Min assist;With upper extremity assist;From chair/3-in-1   Sit to Stand Details Verbal cues for sequencing;Verbal cues for technique;Verbal cues for precautions/safety   Sit to Stand Details (indicate cue type and reason) Requires demo and cueing for proper weight shift adn foot placement prior to ascending    Stand to Sit 4: Min assist;With upper extremity assist;With armrests;To chair/3-in-1    Stand to Sit Details (indicate cue type and reason) Verbal cues for sequencing;Verbal cues for technique;Verbal cues for precautions/safety   Stand to Sit Details Requires demo and cueing for sequencing and hand placement      Ambulation/Gait   Ambulation/Gait Yes   Ambulation/Gait Assistance 4: Min assist   Ambulation/Gait Assistance Details Requires cueing for hand placement on RW and posture   Ambulation Distance (Feet) 168 Feet   Assistive device Prostheses  B hand grips   Gait Pattern Step-to pattern;Decreased step length - right;Decreased step length - left;Decreased stride length;Right flexed knee in stance;Left flexed knee in stance;Trunk flexed;Narrow base of support   Ambulation Surface Level;Indoor   Stairs Yes   Stairs Assistance 2: Max assist  PT & SPT present    Stairs Assistance Details (indicate cue type and reason) Requires demo and cueing for proper technique, weight shift, sequencing, and to "push" with his arms (not pull) to aid in posture. PT was behind and SPT was in front of patient to ensure safety on first trial.   Stair Management Technique One rail Right;Step to pattern;Forwards   Number of Stairs 4   Height of Stairs 6   Curb 2: Max assist  PT and SPT present    Curb Details (indicate cue type and reason) Requires demo and cueing for foot placement, technique and sequencing.    Gait Comments PT instructed patient in lifting/moving walking over threshold on floor to practice being able to lift/move RW, which was challenging when ambulating on curb. PT and patient found that R foot forward made it easier to lift RW.     Neuro Re-ed    Neuro Re-ed Details  --     Prosthetics   Prosthetic Care Comments  PT cued patient through donning B prostheses. Patient reports wearing prostheses 3hrs on/2hrs off (3 sets). PT educated to continue current wear time.   Current prosthetic wear tolerance (days/week)  daily    Current prosthetic wear tolerance (#hours/day)  3hrs  on & 2hrs off 3x/d   Residual limb condition  No drainage was noted. PT removed hard scabs on bilateral residual limbs. All remaining wounds appear to be healing well and scabbing.   Education Provided Proper wear schedule/adjustment;Proper weight-bearing schedule/adjustment;Residual limb care;Proper Donning   Person(s) Educated Patient;Spouse;Child(ren)   Education Method Explanation   Education Method Verbalized understanding                PT Education - 05/12/17 1239    Education provided Yes   Education Details see prosthetic section; begin short household ambulation with RW when there  are 2 people present    Person(s) Educated Patient;Spouse;Child(ren)   Methods Explanation;Demonstration;Tactile cues;Verbal cues   Comprehension Verbalized understanding;Need further instruction          PT Short Term Goals - 04/28/17 1637      PT SHORT TERM GOAL #1   Title Patient and patient's caregiver (due to patient's bilateral hand amputations) will verbalize understanding of proper cleaning, donning, and doffing of prostheses. (TARGET DATE: 05/21/2017)    Time 4   Period Weeks   Status On-going     PT SHORT TERM GOAL #2   Title Patient will tolerate wear 7 days/week for 6hrs total per day without signs of decline in skin integrity issues. (TARGET DATE: 05/21/2017)    Time 4   Period Weeks   Status On-going     PT SHORT TERM GOAL #3   Title Patient will demonstrate static standing balance for 2 minutes and stationery dynamic of head turns to scan with prostheses and unilateral UE support and supervision to indicate an increase in balance and a decrease in falls risk. (TARGET DATE: 05/21/2017)    Time 4   Period Weeks   Status On-going     PT SHORT TERM GOAL #4   Title Patient will demonstrate ability to reach anteriorly 5 inches and to knee level towards the ground with unilateral UE support without a loss of balance to indicate a decrease in his risk of falling. (TARGET DATE:  05/21/2017)    Time 4   Period Weeks   Status On-going     PT SHORT TERM GOAL #5   Title Patient will ambulate 200 feet with min guard with bilateral platform RW to indicate a decrease in his risk of falling. (TARGET DATE: 05/21/2017)    Time 4   Period Weeks   Status On-going           PT Long Term Goals - 04/28/17 1638      PT LONG TERM GOAL #1   Title Patient modified independently donnes & doffes, adjusts ply socks and pt / wife verbalize understanding of proper prosthetic care to ensure safe use of prostheses.  (Target Date: 08/20/2017)   Time 4   Period Months   Status On-going     PT LONG TERM GOAL #2   Title Patient will tolerate prostheses wear >90% of awake hours without skin issues or limb pain to demonstrate a decrease in his risk of falling. (Target Date: 08/20/2017)   Time 4   Period Months   Status On-going     PT LONG TERM GOAL #3   Title Patient performs standing balance activities with intermittent UE support reaching 10", picking up objects from floor and looks over shoulders with weight shift, trunk rotation with prostheses independently. (Target Date: 08/20/2017)     Time 4   Period Weeks   Status On-going     PT LONG TERM GOAL #4   Title Patient will ambulate 1000 feet including outdoor surfaces with LRAD and prostheses modified independently to enable community mobility.  (Target Date: 08/20/2017)   Time 4   Period Months   Status On-going     PT LONG TERM GOAL #5   Title Patient will negotiate ramp/curbs and stairs with LRAD and prostheses modified independent for community access. (Target Date: 08/20/2017)   Time 4   Period Months   Status On-going     PT LONG TERM GOAL #6   Title Patient's gait velocity will be >/= 1.70ft/s to  indicate a limited community ambulator. (Target Date: 08/20/2017)   Time 4   Period Weeks   Status On-going     PT LONG TERM GOAL #7   Title Patient reports Activities of Balance Confindence score using FOTO >25% to  indicate greater confidence in his balance. (Target Date: 08/20/2017)   Time 4   Period Months   Status On-going               Plan - 05/12/17 1413    Clinical Impression Statement Today's skilled PT session focused on advancing patient's prosthetic gait training and introducing stairs, curb, and sit <> stand transfers. Prosthetist Richardson Landry) was present during today's appointment, and he brought patient's B hand grips for RW. Patient is making progress towards goals, and will benefit from continued skilled PT to address functional mobility deficits.    Rehab Potential Good   Clinical Impairments Affecting Rehab Potential arthritis, bicuspid aortic valve, BPH, carpal tunnel syndrome in R wrist, CAD, diverticulitis, GERD, gout, hyperlipidemia, HTN, DM type II,thrombocytopenia, thoracic ascending aortic aneurysm, severe aortic stenosis, amputation of bilateral hands except right thumb (12/11/2016) due to gangrene, bilateral transtibial amputations (12/11/2016) due to gangrene, R and L heart cath (10/20/2016)    PT Frequency 2x / week   PT Duration Other (comment)  16 weeks   PT Treatment/Interventions ADLs/Self Care Home Management;Neuromuscular re-education;Balance training;Therapeutic exercise;Therapeutic activities;Functional mobility training;Stair training;Gait training;DME Instruction;Patient/family education;Prosthetic Training   PT Next Visit Plan STGs due 05/21/2017; continue prosthetic wear/care education with emphasis on becoming more independent in donning/doffing prostheses; progress prosthetic gait training; introduce transfers from armless chairs    Consulted and Agree with Plan of Care Patient;Family member/caregiver   Family Member Consulted Wife - [name]       Patient will benefit from skilled therapeutic intervention in order to improve the following deficits and impairments:  Abnormal gait, Decreased activity tolerance, Decreased balance, Decreased coordination, Decreased range  of motion, Decreased mobility, Decreased knowledge of use of DME, Decreased knowledge of precautions, Decreased endurance, Decreased skin integrity, Decreased scar mobility, Decreased strength, Difficulty walking, Postural dysfunction, Prosthetic Dependency  Visit Diagnosis: Unsteadiness on feet  Other abnormalities of gait and mobility  Abnormal posture  Muscle weakness (generalized)     Problem List Patient Active Problem List   Diagnosis Date Noted  . Amputation of both hands with complication, subsequent encounter 03/01/2017  . Diabetes ([place]) 02/19/2017  . Status post bilateral below knee amputation ([place]) 02/09/2017  . Wound disruption, post-op, skin, sequela 02/09/2017  . Debility 02/05/2017  . Ganglion upper arm, right   . Thrombosis of both upper extremities   . Suspected heparin induced thrombocytopenia (HIT) in hospitalized patient ([place])   . Gangrene of lower extremity ([place])   . Heparin induced thrombocytopenia (HIT) ([place])   . Tracheostomy status ([place])   . Chest tube in place   . Atherosclerosis of native arteries of extremities with gangrene, left leg ([place])   . Atherosclerosis of native arteries of extremities with gangrene, right leg ([place])   . Acute on chronic diastolic CHF (congestive heart failure) ([place])   . Enteritis due to Clostridium difficile   . Anasarca   . FUO (fever of unknown origin)   . Acute encephalopathy   . Elevated LFTs   . DIC (disseminated intravascular coagulation) ([place]) 10/28/2016  . Postoperative atrial fibrillation ([place]) 10/24/2016  . Cardiogenic shock ([place])   . Mitral regurgitation due to cusp prolapse 10/22/2016  . S/P aortic valve replacement with bioprosthetic valve 10/22/2016  .  S/P ascending aortic aneurysm repair 10/22/2016  . S/P mitral valve repair 10/22/2016  . S/P CABG x 3 10/22/2016  . Thrombocytopenia ([place]) 10/22/2016  . Acute combined systolic and diastolic congestive heart failure ([place]) 10/22/2016  . Acute respiratory  failure ([place]) 10/22/2016  . Thoracic ascending aortic aneurysm ([place]) 10/21/2016  . Coronary artery disease involving native coronary artery of native heart with unstable angina pectoris ([place]) 10/20/2016  . Severe aortic stenosis by prior echocardiography 10/19/2016  . Unstable angina ([place]) 10/19/2016  . Aortic valve stenosis 10/19/2016  . Bicuspid aortic valve 10/19/2016  . Trigger ring finger of right hand   . Wears glasses   . Gout   . Hypertension   . Carpal tunnel syndrome of right wrist   . Arthritis   . Snores     [place], SPT  05/12/2017, 2:17 PM  [facility] [address] [place], [place], [address] Phone: [phone]   Fax:  [phone]  Name: Scott [name] MRN: [id number] Date of Birth: 03-23-1946

## 2017-05-17 ENCOUNTER — Encounter: Payer: Self-pay | Admitting: Occupational Therapy

## 2017-05-17 ENCOUNTER — Ambulatory Visit: Payer: Medicare Other | Admitting: Physical Therapy

## 2017-05-17 ENCOUNTER — Encounter: Payer: Self-pay | Admitting: Physical Therapy

## 2017-05-17 ENCOUNTER — Ambulatory Visit: Payer: Medicare Other | Admitting: Occupational Therapy

## 2017-05-17 DIAGNOSIS — R2689 Other abnormalities of gait and mobility: Secondary | ICD-10-CM

## 2017-05-17 DIAGNOSIS — R2681 Unsteadiness on feet: Secondary | ICD-10-CM

## 2017-05-17 DIAGNOSIS — M6281 Muscle weakness (generalized): Secondary | ICD-10-CM | POA: Diagnosis not present

## 2017-05-17 DIAGNOSIS — R208 Other disturbances of skin sensation: Secondary | ICD-10-CM | POA: Diagnosis not present

## 2017-05-17 DIAGNOSIS — R29898 Other symptoms and signs involving the musculoskeletal system: Secondary | ICD-10-CM | POA: Diagnosis not present

## 2017-05-17 DIAGNOSIS — R293 Abnormal posture: Secondary | ICD-10-CM | POA: Diagnosis not present

## 2017-05-17 DIAGNOSIS — M79641 Pain in right hand: Secondary | ICD-10-CM

## 2017-05-17 DIAGNOSIS — M79642 Pain in left hand: Secondary | ICD-10-CM

## 2017-05-17 NOTE — Therapy (Signed)
[place] [address] [place], [place], [address] Phone: [phone]   Fax:  [phone]  Physical Therapy Treatment  Patient Details  Name: Scott [name] MRN: [id number] Date of Birth: 21-Aug-1946 Referring Provider: Meridee Score MD   Encounter Date: 05/17/2017      PT End of Session - 05/17/17 1328    Visit Number 6   Number of Visits 33   Date for PT Re-Evaluation 06/18/17   Authorization Type Medicare & G-codes every 10th visit    PT Start Time 1235   PT Stop Time 1320   PT Time Calculation (min) 45 min   Equipment Utilized During Treatment Gait belt   Activity Tolerance Patient tolerated treatment well   Behavior During Therapy [facility] for tasks assessed/performed      Past Medical History:  Diagnosis Date  . Arthritis    "hips, shoulders; knees; back" (10/20/2016)  . Bicuspid aortic valve   . BPH (benign prostatic hypertrophy)   . Carpal tunnel syndrome of right wrist   . Coronary artery disease involving native coronary artery of native heart with unstable angina pectoris ([place]) 10/20/2016  . Diverticulitis   . GERD (gastroesophageal reflux disease)   . Gout   . Heart murmur   . Hyperlipemia   . Hypertension   . Postoperative atrial fibrillation ([place]) 10/24/2016  . RLS (restless legs syndrome)   . S/P aortic valve replacement with bioprosthetic valve 10/22/2016   25 mm [facility] Ease bovine pericardial bioprosthetic tissue valve  . S/P ascending aortic aneurysm repair 10/22/2016   28 mm supracoronary straight graft replacement of ascending thoracic aortic aneurysm  . S/P CABG x 3 10/22/2016   Sequential LIMA to Diag and LAD, SVG to distal LAD, open vein harvest right thigh  . S/P mitral valve repair 10/22/2016   Artificial Gore-tex neochord placement x6 - posterior annuloplasty band placed but removed due to systolic anterior motion of mitral valve  . Severe aortic stenosis   . Snores    Never  been tested for sleep apnea  . Thoracic ascending aortic aneurysm ([place]) 10/21/2016  . Thrombocytopenia ([place]) 10/22/2016  . Type II diabetes mellitus ([place])     Past Surgical History:  Procedure Laterality Date  . AMPUTATION Bilateral 12/11/2016   Procedure: AMPUTATION BELOW KNEE BILATERALLY;  Surgeon: [name], MD;  Location: [place];  Service: Orthopedics;  Laterality: Bilateral;  . AMPUTATION Bilateral 12/11/2016   Procedure: AMPUTATION BILATERAL HANDS EXCEPT RIGHT THUMB;  Surgeon: [name], MD;  Location: [place];  Service: Orthopedics;  Laterality: Bilateral;  . AORTIC VALVE REPLACEMENT N/A 10/22/2016   Procedure: AORTIC VALVE REPLACEMENT (AVR) WITH SIZE 25 MM MAGNA EASE PERICARDIAL BIOPROSTHESIS - AORTIC;  Surgeon: Rexene Alberts, MD;  Location: [place];  Service: Open Heart Surgery;  Laterality: N/A;  . BONE EXCISION Right 02/01/2017   Procedure: EXCISION RIGHT INDEX METACARPAL HEAD;  Surgeon: [name], MD;  Location: [place];  Service: Orthopedics;  Laterality: Right;  . CARDIAC CATHETERIZATION N/A 10/20/2016   Procedure: Right/Left Heart Cath and Coronary Angiography;  Surgeon: Leonie Man, MD;  Location: [place] [facility];  Service: Cardiovascular;  Laterality: N/A;  . CARPAL TUNNEL RELEASE Right 11/28/2013   Procedure: RIGHT WRIST CARPAL TUNNEL RELEASE;  Surgeon: Lorn Junes, MD;  Location: [place];  Service: Orthopedics;  Laterality: Right;  . CATARACT EXTRACTION W/ INTRAOCULAR LENS  IMPLANT, BILATERAL Bilateral 1978  . COLONOSCOPY    .  CORONARY ARTERY BYPASS GRAFT N/A 10/22/2016   Procedure: CORONARY ARTERY BYPASS GRAFTING (CABG)x 2 WITH LIMA TO DIAGONAL, OPEN  HARVESTING OF RIGHT SAPHENOUS VEIN FOR VEIN GRAFT TO LAD;  Surgeon: Rexene Alberts, MD;  Location: [place];  Service: Open Heart Surgery;  Laterality: N/A;  . FRACTURE SURGERY    . INGUINAL HERNIA REPAIR Right 1998  . IR GENERIC HISTORICAL  12/07/2016   IR US GUIDE VASC ACCESS RIGHT 12/07/2016  Aletta Edouard, MD [facility]  . IR GENERIC HISTORICAL  12/07/2016   IR RADIOLOGY PERIPHERAL GUIDED IV START 12/07/2016 Aletta Edouard, MD [facility]  . IR GENERIC HISTORICAL  12/07/2016   IR GASTROSTOMY TUBE MOD SED 12/07/2016 Aletta Edouard, MD [facility]  . LIPOMA EXCISION Right 2008   "side of my head"  . MITRAL VALVE REPAIR N/A 10/22/2016   Procedure: MITRAL VALVE REPAIR (MVR) WITH SIZE 30 SORIN ANNULOFLEX ANNULOPLASTY RING WITH SUBSEQUENT REMOVAL OF RING;  Surgeon: Rexene Alberts, MD;  Location: [facility];  Service: Open Heart Surgery;  Laterality: N/A;  . PENECTOMY  2007   Peyronie's disease   . PENILE PROSTHESIS IMPLANT  2009  . REMOVAL OF PENILE PROSTHESIS N/A 02/01/2017   Procedure: REMOVAL OF PENILE PROSTHESIS;  Surgeon: Kathie Rhodes, MD;  Location: [place];  Service: Urology;  Laterality: N/A;  . SHOULDER OPEN ROTATOR CUFF REPAIR Right 2006  . STERNAL CLOSURE N/A 10/26/2016   Procedure: STERNAL WASHOUT AND DELAYED PRIMARY CLOSURE;  Surgeon: Rexene Alberts, MD;  Location: [place];  Service: Thoracic;  Laterality: N/A;  . TEE WITHOUT CARDIOVERSION N/A 10/26/2016   Procedure: TRANSESOPHAGEAL ECHOCARDIOGRAM (TEE);  Surgeon: Rexene Alberts, MD;  Location: [place];  Service: Thoracic;  Laterality: N/A;  . TEE WITHOUT CARDIOVERSION N/A 10/22/2016   Procedure: TRANSESOPHAGEAL ECHOCARDIOGRAM (TEE);  Surgeon: Rexene Alberts, MD;  Location: [facility];  Service: Open Heart Surgery;  Laterality: N/A;  . THORACIC AORTIC ANEURYSM REPAIR  10/22/2016   Procedure: ASCENDING AORTIC  ANEURYSM REPAIR (AAA) WITH 28 MM HEMASHIELD PLATINUM WOVEN DOUBLE VELOUR VASCULAR GRAFT;  Surgeon: Rexene Alberts, MD;  Location: [facility];  Service: Open Heart Surgery;;  . TONSILLECTOMY  ~ 1955  . TRACHEOSTOMY TUBE PLACEMENT N/A 11/09/2016   Procedure: TRACHEOSTOMY;  Surgeon: Rexene Alberts, MD;  Location: [place];  Service: Thoracic;  Laterality: N/A;  . TRANSURETHRAL RESECTION OF PROSTATE  2005  . TRIGGER FINGER RELEASE  Bilateral    several lt and rt hands  . TRIGGER FINGER RELEASE Right 11/28/2013   Procedure: RIGHT TRIGGER FINGER  RELEASE (TENDON SHEATH INCISION);  Surgeon: Lorn Junes, MD;  Location: [place];  Service: Orthopedics;  Laterality: Right;  Marland Kitchen VIDEO BRONCHOSCOPY N/A 11/09/2016   Procedure: VIDEO BRONCHOSCOPY;  Surgeon: Rexene Alberts, MD;  Location: [place];  Service: Thoracic;  Laterality: N/A;  . WRIST FRACTURE SURGERY Right ~ 1959    There were no vitals filed for this visit.      Subjective Assessment - 05/17/17 1239    Subjective Denies any falls, complaints or changes since last visit. Patient reports he has been walking with RW with 2 family members present without trouble.    Patient is accompained by: Family member  wife, daughter, grand-daughter   Pertinent History arthritis, bicuspid aortic valve, BPH, carpal tunnel syndrome in R wrist, CAD, diverticulitis, GERD, gout, hyperlipidemia, HTN, DM type II,thrombocytopenia, thoracic ascending aortic aneurysm, severe aortic stenosis, amputation of bilateral hands except right thumb (12/11/2016) due to  gangrene, bilateral transtibial amputations (12/11/2016) due to gangrene, R and L heart cath (10/20/2016)    Limitations Standing;Walking;House hold activities   Patient Stated Goals use prostheses to walk including stairs, outdoors, target shooting, drive   Currently in Pain? No/denies                         [facility] Adult PT Treatment/Exercise - 05/17/17 1230      Transfers   Transfers Sit to Stand;Stand to Sit   Sit to Stand 4: Min assist;With upper extremity assist;From chair/3-in-1  with armless chair    Sit to Stand Details Verbal cues for sequencing;Verbal cues for technique;Verbal cues for precautions/safety   Sit to Stand Details (indicate cue type and reason) Requires demo and cueing for proper technique    Stand to Sit 4: Min assist;With upper extremity assist;To chair/3-in-1  to armless chair     Stand to Sit Details (indicate cue type and reason) Verbal cues for sequencing;Verbal cues for technique;Verbal cues for precautions/safety   Stand to Sit Details Requires demo and cueing for proper hand placement      Ambulation/Gait   Ambulation/Gait Yes   Ambulation/Gait Assistance 4: Min guard   Ambulation/Gait Assistance Details Requires cueing to ambulate within the RW's BOS especially when turning. Requires verbal cueing for step length    Ambulation Distance (Feet) 200 Feet   Assistive device Prostheses  B hand grips   Gait Pattern Step-to pattern;Decreased step length - right;Decreased step length - left;Decreased stride length;Right flexed knee in stance;Left flexed knee in stance;Trunk flexed;Narrow base of support   Ambulation Surface Level;Indoor   Stairs Yes   Stairs Assistance 4: Min assist   Stairs Assistance Details (indicate cue type and reason) PT cued patient to perform one set of 4 steps leading with LLE, and the second set of 4 steps with RLE. Requires cueing for proper use of BUE support ("push" with arms)   Stair Management Technique One rail Right;One rail Left;Step to pattern;Forwards   Number of Stairs 4  x 2 sets (1 set leading with LLE; 1 set leading with RLE)    Height of Stairs 6   Ramp 3: Mod assist 2 people (1 for mod A on pt & 1 for RW control)   Ramp Details (indicate cue type and reason) Requires demo and cueing for technique and weight shift    Curb 3: Mod assist  PT and SPT present    Curb Details (indicate cue type and reason) Requires demo and mod A with assistance from a second person to aid in managing RW   Gait Comments --     Prosthetics   Prosthetic Care Comments  Patient presents to PT with new temporary, laminated sockets on B prostheses. Patient's RW has new loop on L hand grip strap for R thumb. PT educated patient to increase wear time to 4hrs on/1hr off. Always wear Dr. Sharol Given socks when prostheses are not donned.    Current prosthetic  wear tolerance (days/week)  daily    Current prosthetic wear tolerance (#hours/day)  3hrs on; 2 hrs off - 3x/day    Residual limb condition  Wounds on bilateral residual limbs appear to be healing well. Many scabs have been removed, and wounds underneath appear to be healing well.   Education Provided Proper wear schedule/adjustment;Proper weight-bearing schedule/adjustment;Residual limb care;Proper Donning   Person(s) Educated Patient;Spouse   Education Method Explanation   Education Method Verbalized understanding   Donning Prosthesis  Minimal assist   Doffing Prosthesis Minimal assist                  PT Short Term Goals - 05/17/17 1303      PT SHORT TERM GOAL #1   Title Patient and patient's caregiver (due to patient's bilateral hand amputations) will verbalize understanding of proper cleaning, donning, and doffing of prostheses. (TARGET DATE: 05/21/2017)    Baseline MET 05/17/2017   Time 4   Period Weeks   Status Achieved     PT SHORT TERM GOAL #2   Title Patient will tolerate wear 7 days/week for 6hrs total per day without signs of decline in skin integrity issues. (TARGET DATE: 05/21/2017)    Baseline MET 05/17/2017   Time 4   Period Weeks   Status Achieved     PT SHORT TERM GOAL #3   Title Patient will demonstrate static standing balance for 2 minutes and stationery dynamic of head turns to scan with prostheses and unilateral UE support and supervision to indicate an increase in balance and a decrease in falls risk. (TARGET DATE: 05/21/2017)    Time 4   Period Weeks   Status On-going     PT SHORT TERM GOAL #4   Title Patient will demonstrate ability to reach anteriorly 5 inches and to knee level towards the ground with unilateral UE support without a loss of balance to indicate a decrease in his risk of falling. (TARGET DATE: 05/21/2017)    Time 4   Period Weeks   Status On-going     PT SHORT TERM GOAL #5   Title Patient will ambulate 200 feet with min guard with  bilateral platform RW to indicate a decrease in his risk of falling. (TARGET DATE: 05/21/2017)    Baseline MET 05/17/2017   Time 4   Period Weeks   Status Achieved           PT Long Term Goals - 04/28/17 1638      PT LONG TERM GOAL #1   Title Patient modified independently donnes & doffes, adjusts ply socks and pt / wife verbalize understanding of proper prosthetic care to ensure safe use of prostheses.  (Target Date: 08/20/2017)   Time 4   Period Months   Status On-going     PT LONG TERM GOAL #2   Title Patient will tolerate prostheses wear >90% of awake hours without skin issues or limb pain to demonstrate a decrease in his risk of falling. (Target Date: 08/20/2017)   Time 4   Period Months   Status On-going     PT LONG TERM GOAL #3   Title Patient performs standing balance activities with intermittent UE support reaching 10", picking up objects from floor and looks over shoulders with weight shift, trunk rotation with prostheses independently. (Target Date: 08/20/2017)     Time 4   Period Weeks   Status On-going     PT LONG TERM GOAL #4   Title Patient will ambulate 1000 feet including outdoor surfaces with LRAD and prostheses modified independently to enable community mobility.  (Target Date: 08/20/2017)   Time 4   Period Months   Status On-going     PT LONG TERM GOAL #5   Title Patient will negotiate ramp/curbs and stairs with LRAD and prostheses modified independent for community access. (Target Date: 08/20/2017)   Time 4   Period Months   Status On-going     PT LONG TERM GOAL #6   Title  Patient's gait velocity will be >/= 1.32f/s to indicate a limited community ambulator. (Target Date: 08/20/2017)   Time 4   Period Weeks   Status On-going     PT LONG TERM GOAL #7   Title Patient reports Activities of Balance Confindence score using FOTO >25% to indicate greater confidence in his balance. (Target Date: 08/20/2017)   Time 4   Period Months   Status On-going                Plan - 05/17/17 1330    Clinical Impression Statement Today's skilled PT session focused on advancing patient's prosthetic care and wear education and progressing prosthetic gait training including ramp/curb/stairs and transfer training from [employer] PT began to assess short term goals, and patient has met 2 of 5 short term goals. PT has not assessed all goals, and plans to continue to assess short term goals at next session. Patient denies any increase in pain, but reported LE fatigue and required multiple seated rest breaks. Patient is making progress, and will benefit from continued skilled PT.    Rehab Potential Good   Clinical Impairments Affecting Rehab Potential arthritis, bicuspid aortic valve, BPH, carpal tunnel syndrome in R wrist, CAD, diverticulitis, GERD, gout, hyperlipidemia, HTN, DM type II,thrombocytopenia, thoracic ascending aortic aneurysm, severe aortic stenosis, amputation of bilateral hands except right thumb (12/11/2016) due to gangrene, bilateral transtibial amputations (12/11/2016) due to gangrene, R and L heart cath (10/20/2016)    PT Frequency 2x / week   PT Duration Other (comment)  16 weeks   PT Treatment/Interventions ADLs/Self Care Home Management;Neuromuscular re-education;Balance training;Therapeutic exercise;Therapeutic activities;Functional mobility training;Stair training;Gait training;DME Instruction;Patient/family education;Prosthetic Training   PT Next Visit Plan assess remaining STGs; progress prosthetic gait training; introduce transfers from armless chairs; side stepping within RW to get in/out of bathroom door (per patient request)    Consulted and Agree with Plan of Care Patient;Family member/caregiver   Family Member Consulted Wife - [name]       Patient will benefit from skilled therapeutic intervention in order to improve the following deficits and impairments:  Abnormal gait, Decreased activity tolerance, Decreased balance,  Decreased coordination, Decreased range of motion, Decreased mobility, Decreased knowledge of use of DME, Decreased knowledge of precautions, Decreased endurance, Decreased skin integrity, Decreased scar mobility, Decreased strength, Difficulty walking, Postural dysfunction, Prosthetic Dependency  Visit Diagnosis: Unsteadiness on feet  Other abnormalities of gait and mobility  Muscle weakness (generalized)     Problem List Patient Active Problem List   Diagnosis Date Noted  . Amputation of both hands with complication, subsequent encounter 03/01/2017  . Diabetes ([place] 02/19/2017  . Status post bilateral below knee amputation ([place] 02/09/2017  . Wound disruption, post-op, skin, sequela 02/09/2017  . Debility 02/05/2017  . Ganglion upper arm, right   . Thrombosis of both upper extremities   . Suspected heparin induced thrombocytopenia (HIT) in hospitalized patient ([place]   . Gangrene of lower extremity ([place]   . Heparin induced thrombocytopenia (HIT) ([place]   . Tracheostomy status ([place]   . Chest tube in place   . Atherosclerosis of native arteries of extremities with gangrene, left leg ([place]   . Atherosclerosis of native arteries of extremities with gangrene, right leg ([place]   . Acute on chronic diastolic CHF (congestive heart failure) ([place]   . Enteritis due to Clostridium difficile   . Anasarca   . FUO (fever of unknown origin)   . Acute encephalopathy   . Elevated LFTs   . DIC (  disseminated intravascular coagulation) ([place]) 10/28/2016  . Postoperative atrial fibrillation ([place]) 10/24/2016  . Cardiogenic shock ([place])   . Mitral regurgitation due to cusp prolapse 10/22/2016  . S/P aortic valve replacement with bioprosthetic valve 10/22/2016  . S/P ascending aortic aneurysm repair 10/22/2016  . S/P mitral valve repair 10/22/2016  . S/P CABG x 3 10/22/2016  . Thrombocytopenia ([place]) 10/22/2016  . Acute combined systolic and diastolic congestive heart failure ([place]) 10/22/2016  .  Acute respiratory failure ([place]) 10/22/2016  . Thoracic ascending aortic aneurysm ([place]) 10/21/2016  . Coronary artery disease involving native coronary artery of native heart with unstable angina pectoris ([place]) 10/20/2016  . Severe aortic stenosis by prior echocardiography 10/19/2016  . Unstable angina ([place]) 10/19/2016  . Aortic valve stenosis 10/19/2016  . Bicuspid aortic valve 10/19/2016  . Trigger ring finger of right hand   . Wears glasses   . Gout   . Hypertension   . Carpal tunnel syndrome of right wrist   . Arthritis   . Snores     [place], SPT  05/17/2017, 1:32 PM  [facility] [address] [place], [place], [address] Phone: [phone]   Fax:  [phone]  Name: Scott [name] MRN: [id number] Date of Birth: 1945-12-22

## 2017-05-17 NOTE — Therapy (Signed)
[facility] [address] [place], [place], [address] Phone: [phone]   Fax:  [phone]  Occupational Therapy Evaluation  Patient Details  Name: Scott [name] MRN: [id number] Date of Birth: 1945/12/24 Referring Provider: Dr. Meridee Score  Encounter Date: 05/17/2017      OT End of Session - 05/17/17 1706    Visit Number 1   Number of Visits 16   Date for OT Re-Evaluation 07/15/17   Authorization Type medicare pt will need G code and PN every 10th visit   Authorization Time Period 60 days   Authorization - Visit Number 1   Authorization - Number of Visits 10   OT Start Time 1146   OT Stop Time 1235   OT Time Calculation (min) 49 min   Activity Tolerance Patient tolerated treatment well      Past Medical History:  Diagnosis Date  . Arthritis    "hips, shoulders; knees; back" (10/20/2016)  . Bicuspid aortic valve   . BPH (benign prostatic hypertrophy)   . Carpal tunnel syndrome of right wrist   . Coronary artery disease involving native coronary artery of native heart with unstable angina pectoris ([place]) 10/20/2016  . Diverticulitis   . GERD (gastroesophageal reflux disease)   . Gout   . Heart murmur   . Hyperlipemia   . Hypertension   . Postoperative atrial fibrillation ([place]) 10/24/2016  . RLS (restless legs syndrome)   . S/P aortic valve replacement with bioprosthetic valve 10/22/2016   25 mm [facility] Ease bovine pericardial bioprosthetic tissue valve  . S/P ascending aortic aneurysm repair 10/22/2016   28 mm supracoronary straight graft replacement of ascending thoracic aortic aneurysm  . S/P CABG x 3 10/22/2016   Sequential LIMA to Diag and LAD, SVG to distal LAD, open vein harvest right thigh  . S/P mitral valve repair 10/22/2016   Artificial Gore-tex neochord placement x6 - posterior annuloplasty band placed but removed due to systolic anterior motion of mitral valve  . Severe aortic stenosis    . Snores    Never been tested for sleep apnea  . Thoracic ascending aortic aneurysm ([place]) 10/21/2016  . Thrombocytopenia ([place]) 10/22/2016  . Type II diabetes mellitus ([place])     Past Surgical History:  Procedure Laterality Date  . AMPUTATION Bilateral 12/11/2016   Procedure: AMPUTATION BELOW KNEE BILATERALLY;  Surgeon: [name], MD;  Location: [facility];  Service: Orthopedics;  Laterality: Bilateral;  . AMPUTATION Bilateral 12/11/2016   Procedure: AMPUTATION BILATERAL HANDS EXCEPT RIGHT THUMB;  Surgeon: [name], MD;  Location: [facility];  Service: Orthopedics;  Laterality: Bilateral;  . AORTIC VALVE REPLACEMENT N/A 10/22/2016   Procedure: AORTIC VALVE REPLACEMENT (AVR) WITH SIZE 25 MM MAGNA EASE PERICARDIAL BIOPROSTHESIS - AORTIC;  Surgeon: Rexene Alberts, MD;  Location: [facility];  Service: Open Heart Surgery;  Laterality: N/A;  . BONE EXCISION Right 02/01/2017   Procedure: EXCISION RIGHT INDEX METACARPAL HEAD;  Surgeon: [name], MD;  Location: [facility];  Service: Orthopedics;  Laterality: Right;  . CARDIAC CATHETERIZATION N/A 10/20/2016   Procedure: Right/Left Heart Cath and Coronary Angiography;  Surgeon: Leonie Man, MD;  Location: [facility];  Service: Cardiovascular;  Laterality: N/A;  . CARPAL TUNNEL RELEASE Right 11/28/2013   Procedure: RIGHT WRIST CARPAL TUNNEL RELEASE;  Surgeon: Lorn Junes, MD;  Location: [facility];  Service: Orthopedics;  Laterality: Right;  . CATARACT EXTRACTION W/ INTRAOCULAR LENS  IMPLANT, BILATERAL  Bilateral 1978  . COLONOSCOPY    . CORONARY ARTERY BYPASS GRAFT N/A 10/22/2016   Procedure: CORONARY ARTERY BYPASS GRAFTING (CABG)x 2 WITH LIMA TO DIAGONAL, OPEN  HARVESTING OF RIGHT SAPHENOUS VEIN FOR VEIN GRAFT TO LAD;  Surgeon: Rexene Alberts, MD;  Location: [facility];  Service: Open Heart Surgery;  Laterality: N/A;  . FRACTURE SURGERY    . INGUINAL HERNIA REPAIR Right 1998  . IR GENERIC HISTORICAL  12/07/2016   IR US GUIDE VASC  ACCESS RIGHT 12/07/2016 Aletta Edouard, MD [facility]  . IR GENERIC HISTORICAL  12/07/2016   IR RADIOLOGY PERIPHERAL GUIDED IV START 12/07/2016 Aletta Edouard, MD [facility]  . IR GENERIC HISTORICAL  12/07/2016   IR GASTROSTOMY TUBE MOD SED 12/07/2016 Aletta Edouard, MD [facility]  . LIPOMA EXCISION Right 2008   "side of my head"  . MITRAL VALVE REPAIR N/A 10/22/2016   Procedure: MITRAL VALVE REPAIR (MVR) WITH SIZE 30 SORIN ANNULOFLEX ANNULOPLASTY RING WITH SUBSEQUENT REMOVAL OF RING;  Surgeon: Rexene Alberts, MD;  Location: [facility];  Service: Open Heart Surgery;  Laterality: N/A;  . PENECTOMY  2007   Peyronie's disease   . PENILE PROSTHESIS IMPLANT  2009  . REMOVAL OF PENILE PROSTHESIS N/A 02/01/2017   Procedure: REMOVAL OF PENILE PROSTHESIS;  Surgeon: Kathie Rhodes, MD;  Location: [facility];  Service: Urology;  Laterality: N/A;  . SHOULDER OPEN ROTATOR CUFF REPAIR Right 2006  . STERNAL CLOSURE N/A 10/26/2016   Procedure: STERNAL WASHOUT AND DELAYED PRIMARY CLOSURE;  Surgeon: Rexene Alberts, MD;  Location: [facility];  Service: Thoracic;  Laterality: N/A;  . TEE WITHOUT CARDIOVERSION N/A 10/26/2016   Procedure: TRANSESOPHAGEAL ECHOCARDIOGRAM (TEE);  Surgeon: Rexene Alberts, MD;  Location: [facility];  Service: Thoracic;  Laterality: N/A;  . TEE WITHOUT CARDIOVERSION N/A 10/22/2016   Procedure: TRANSESOPHAGEAL ECHOCARDIOGRAM (TEE);  Surgeon: Rexene Alberts, MD;  Location: [facility];  Service: Open Heart Surgery;  Laterality: N/A;  . THORACIC AORTIC ANEURYSM REPAIR  10/22/2016   Procedure: ASCENDING AORTIC  ANEURYSM REPAIR (AAA) WITH 28 MM HEMASHIELD PLATINUM WOVEN DOUBLE VELOUR VASCULAR GRAFT;  Surgeon: Rexene Alberts, MD;  Location: [facility];  Service: Open Heart Surgery;;  . TONSILLECTOMY  ~ 1955  . TRACHEOSTOMY TUBE PLACEMENT N/A 11/09/2016   Procedure: TRACHEOSTOMY;  Surgeon: Rexene Alberts, MD;  Location: [facility];  Service: Thoracic;  Laterality: N/A;  . TRANSURETHRAL RESECTION OF PROSTATE  2005  .  TRIGGER FINGER RELEASE Bilateral    several lt and rt hands  . TRIGGER FINGER RELEASE Right 11/28/2013   Procedure: RIGHT TRIGGER FINGER  RELEASE (TENDON SHEATH INCISION);  Surgeon: Lorn Junes, MD;  Location: [facility];  Service: Orthopedics;  Laterality: Right;  Marland Kitchen VIDEO BRONCHOSCOPY N/A 11/09/2016   Procedure: VIDEO BRONCHOSCOPY;  Surgeon: Rexene Alberts, MD;  Location: [facility];  Service: Thoracic;  Laterality: N/A;  . WRIST FRACTURE SURGERY Right ~ 1959    There were no vitals filed for this visit.      Subjective Assessment - 05/17/17 1152    Patient is accompained by: Family member  wife Scott [name]   Pertinent History see epic. Pt with Bil BKA as well as bilateral UE amputations of all fingers excepet L thumb at MCP joint due to gangrene.    Patient Stated Goals walk, drive a car and go where I want to go. Wife hopes pt can go out to eat in a restaurant and use public bathroom and  eat .    Currently in Pain? Yes   Pain Score 7    Pain Location Hand  at MCP line   Pain Orientation Right;Left   Pain Descriptors / Indicators Sore;Tender   Pain Type Acute pain   Pain Onset More than a month ago   Pain Frequency Constant   Aggravating Factors  cold, if I use  my hands - i have to be careful   Pain Relieving Factors nothing that I know Pt occassional gets phantom pain, R hand greater than L.            [facility] OT Assessment - 05/17/17 0001      Assessment   Diagnosis Bil hand amputations at MCP except for R thumb   Referring Provider Dr. Meridee Score   Onset Date 12/11/16   Prior Therapy Inpt PT, OT followed by HHPT and HHOT.       Precautions   Precautions Fall     Restrictions   Weight Bearing Restrictions No     Balance Screen   Has the patient fallen in the past 6 months Yes  slid out of chair in living room - pt seeing PT     [place] expects to be discharged to: Private residence   Living Arrangements Spouse/significant  other   Available Help at Discharge Available 24 hours/day   Home Layout Two level   Bathroom Shower/Tub --  pt currently not showering no accessible bath on 1st floor   Bathroom Toilet --  3 in 1 in the room   Additional Comments 3 in 1, wheelchair, walker.       Prior Function   Level of Independence Independent   Vocation Retired   Leisure [employer], work in my garage, play with my dogs, play with grandkids     ADL   Eating/Feeding Minimal assistance  min a for cutting; uses universal cuff   Grooming Modified independent  with universal cuff   Upper Body Bathing Minimal assistance  for back bed level   Lower Body Bathing Modified independent  bed level   Upper Body Dressing Minimal assistance  for buttons   Lower Body Dressing Minimal assistance  buttons, zippers; pt able to don and doff prosthesis   Toilet Transfer Minimal assistance  steadying assist with LE prosthesis   Toileting - Clothing Manipulation Minimal assistance  for buttons and zippers   Toileting -  Hygiene Maximal assistance   ADL comments Pt has bathtub with a shower head upstairs. Pt is not doing stairs.    7, landing then 7 more steps     IADL   Shopping Assistance for transportation;Needs to be accompanied on any shopping trip   Light Housekeeping --  pt not doing any home mgmt tasks   Meal Prep --  pt cooked "some" before mostly breakfast   [place] on family or friends for transportation   Medication Management Is not capable of dispensing or managing own medication   Financial Management Manages financial matters independently (budgets, writes checks, pays rent, bills goes to bank), collects and keeps track of income     Mobility   Mobility Status Needs assist   Mobility Status Comments needs assist with wheelchair on carpet and in community.  PT working on ambulation with prostheses and walker.      Written Expression   Dominant Hand Right     Vision - History    Baseline Vision Wears glasses only  for reading     Vision Assessment   Comment Pt denies any other visual issues.     Activity Tolerance   Activity Tolerance Tolerates 10-20 min activity with muiltiple rests  if attempting to stand or walk for activities.      Cognition   Overall Cognitive Status Impaired/Different from baseline   Mini Mental State Exam  WIfe feels pt has long term memory issues as well as some episodic memory. "All in all I think he is doing well tho"    Memory Impaired  per pt day to day things like appts     Sensation   Light Touch Impaired Detail  hypersensitive at amputation line B hands   Hot/Cold Impaired Detail  hypersenstive to cold     Coordination   Gross Motor Movements are Fluid and Coordinated Yes  pt reports some arthritis in both shoulders but no worse   Fine Motor Movements are Fluid and Coordinated --  pt only has R thumb     ROM / Strength   AROM / PROM / Strength AROM;Strength     AROM   Overall AROM  Within functional limits for tasks performed   Overall AROM Comments For BUE's except for hands - pt only  has R thumb     Strength   Overall Strength Within functional limits for tasks performed   Overall Strength Comments BUE's except for hands pt only has R thumb.     Hand Function   Right Hand Gross Grasp Impaired   Left Hand Gross Grasp Impaired   Comment Pt only has R thumb due to amputations at the MCP line on both hands. Pt using universal cuff or palming items when doing functional tasks.                            OT Short Term Goals - 05/17/17 1257      OT SHORT TERM GOAL #1   Title PT and wife will be mod I with home activities program - 06/14/2017   Status New     OT SHORT TERM GOAL #2   Title Pt will be supervision for 3 in 1 commode transfers   Status New     OT SHORT TERM GOAL #3   Title Pt will demonstrate ability to use opposition splint with prn with basic ADL tasks   Status New     OT  SHORT TERM GOAL #4   Title Pt will be able to complete simple cold snack prep with min a   Status New           OT Long Term Goals - 05/17/17 1300      OT LONG TERM GOAL #1   Title Pt will be mod I with home activities program - 07/12/2017   Status New     OT LONG TERM GOAL #2   Title Pt will be mod I with toilet transfers   Status New     OT LONG TERM GOAL #3   Title IF pt is able to complete stairs, pt will be mod I with tub bench transfers   Status New     OT LONG TERM GOAL #4   Title Pt will verbalize understanding of driving eval information   Status New     OT LONG TERM GOAL #5   Title Pt will be mod I with toilet hygiene with AE   Status New  Long Term Additional Goals   Additional Long Term Goals Yes     OT LONG TERM GOAL #6   Title Pt will be mod I with buttoning, zipping using AE/prostheses   Status New     OT LONG TERM GOAL #7   Title Pt will be mod I with cutting food with AE/prostheses   Status New               Plan - 05/17/17 1304    Clinical Impression Statement PT is a 71 year old male s/p bilateral BKA as well as bilateral UE amputations of the fingers at MCP joints for both hands except for R thumb on 12/11/2016.  Pt was hospitalized until 02/21/2017 including inpt rehab stay and then had [facility] and [facility].  Pt currently presents with the following deficts that impact ADL, IADL, and leisure: decreased functional use of B hands, pain in both hands, decreasd education on using hand prostheses, decreased balance, impaired sensation in both hands,  Pt will benefit from skilled to to address these areas and maximize independence.    Occupational Profile and client history currently impacting functional performance PMH: arthritis, bicuspid aortic valve, BPH, CAD, diverticulitis, DM, GERD, HTN, HLD, gout, aortic stenosis, CABG x4   Occupational performance deficits (Please refer to evaluation for details): ADL's;IADL's;Leisure;Social Participation   Rehab  Potential Fair   OT Frequency 2x / week   OT Duration 8 weeks   OT Treatment/Interventions Self-care/ADL training;Moist Heat;Cryotherapy;Ultrasound;Fluidtherapy;DME and/or AE instruction;Neuromuscular education;Therapeutic exercise;Therapist, nutritional;Therapeutic activities;Splinting;Patient/family education;Balance training   Plan assess use of opposition splint, problem solve for toileting hygiene, address toilet transfers.    Consulted and Agree with Plan of Care Patient;Family member/caregiver   Family Member Consulted wife      Patient will benefit from skilled therapeutic intervention in order to improve the following deficits and impairments:  Abnormal gait, Decreased activity tolerance, Decreased balance, Decreased mobility, Decreased knowledge of use of DME, Difficulty walking, Impaired UE functional use, Impaired sensation, Pain, Other (comment) (decreased knowledge on using hand prostheses)  Visit Diagnosis: Other symptoms and signs involving the musculoskeletal system - Plan: Ot plan of care cert/re-cert  Unsteadiness on feet - Plan: Ot plan of care cert/re-cert  Other disturbances of skin sensation - Plan: Ot plan of care cert/re-cert  Pain in right hand - Plan: Ot plan of care cert/re-cert  Pain in left hand - Plan: Ot plan of care cert/re-cert      G-Codes - 40/10/27 1710    Functional Assessment Tool Used (Outpatient only) skilled clinical observation   Functional Limitation Self care   Self Care Current Status [phone]) At least 60 percent but less than 80 percent impaired, limited or restricted   Self Care Goal Status ([id number]) At least 40 percent but less than 60 percent impaired, limited or restricted      Problem List Patient Active Problem List   Diagnosis Date Noted  . Amputation of both hands with complication, subsequent encounter 03/01/2017  . Diabetes ([place]) 02/19/2017  . Status post bilateral below knee amputation ([place]) 02/09/2017  . Wound  disruption, post-op, skin, sequela 02/09/2017  . Debility 02/05/2017  . Ganglion upper arm, right   . Thrombosis of both upper extremities   . Suspected heparin induced thrombocytopenia (HIT) in hospitalized patient ([place])   . Gangrene of lower extremity ([place])   . Heparin induced thrombocytopenia (HIT) ([place])   . Tracheostomy status ([place])   . Chest tube in place   . Atherosclerosis of native  arteries of extremities with gangrene, left leg ([facility])   . Atherosclerosis of native arteries of extremities with gangrene, right leg ([place])   . Acute on chronic diastolic CHF (congestive heart failure) ([place])   . Enteritis due to Clostridium difficile   . Anasarca   . FUO (fever of unknown origin)   . Acute encephalopathy   . Elevated LFTs   . DIC (disseminated intravascular coagulation) ([place]) 10/28/2016  . Postoperative atrial fibrillation ([place]) 10/24/2016  . Cardiogenic shock ([place])   . Mitral regurgitation due to cusp prolapse 10/22/2016  . S/P aortic valve replacement with bioprosthetic valve 10/22/2016  . S/P ascending aortic aneurysm repair 10/22/2016  . S/P mitral valve repair 10/22/2016  . S/P CABG x 3 10/22/2016  . Thrombocytopenia ([place]) 10/22/2016  . Acute combined systolic and diastolic congestive heart failure ([place]) 10/22/2016  . Acute respiratory failure ([place]) 10/22/2016  . Thoracic ascending aortic aneurysm ([place]) 10/21/2016  . Coronary artery disease involving native coronary artery of native heart with unstable angina pectoris ([place]) 10/20/2016  . Severe aortic stenosis by prior echocardiography 10/19/2016  . Unstable angina ([place]) 10/19/2016  . Aortic valve stenosis 10/19/2016  . Bicuspid aortic valve 10/19/2016  . Trigger ring finger of right hand   . Wears glasses   . Gout   . Hypertension   . Carpal tunnel syndrome of right wrist   . Arthritis   . Snores     Quay Burow, OTR/L 05/17/2017, 5:13 PM  [facility] [address] [place], [place], [address] Phone: [phone]   Fax:  [phone]  Name: Scott [name] MRN: [id number] Date of Birth: [birth year]

## 2017-05-19 ENCOUNTER — Encounter: Payer: Self-pay | Admitting: Physical Therapy

## 2017-05-19 ENCOUNTER — Ambulatory Visit: Payer: Medicare Other | Admitting: Physical Therapy

## 2017-05-19 DIAGNOSIS — M6281 Muscle weakness (generalized): Secondary | ICD-10-CM | POA: Diagnosis not present

## 2017-05-19 DIAGNOSIS — R208 Other disturbances of skin sensation: Secondary | ICD-10-CM | POA: Diagnosis not present

## 2017-05-19 DIAGNOSIS — R29898 Other symptoms and signs involving the musculoskeletal system: Secondary | ICD-10-CM | POA: Diagnosis not present

## 2017-05-19 DIAGNOSIS — R2689 Other abnormalities of gait and mobility: Secondary | ICD-10-CM

## 2017-05-19 DIAGNOSIS — R293 Abnormal posture: Secondary | ICD-10-CM | POA: Diagnosis not present

## 2017-05-19 DIAGNOSIS — R2681 Unsteadiness on feet: Secondary | ICD-10-CM | POA: Diagnosis not present

## 2017-05-20 ENCOUNTER — Ambulatory Visit (INDEPENDENT_AMBULATORY_CARE_PROVIDER_SITE_OTHER): Payer: Medicare Other | Admitting: *Deleted

## 2017-05-20 DIAGNOSIS — I82603 Acute embolism and thrombosis of unspecified veins of upper extremity, bilateral: Secondary | ICD-10-CM | POA: Diagnosis not present

## 2017-05-20 DIAGNOSIS — I2510 Atherosclerotic heart disease of native coronary artery without angina pectoris: Secondary | ICD-10-CM

## 2017-05-20 DIAGNOSIS — I9789 Other postprocedural complications and disorders of the circulatory system, not elsewhere classified: Secondary | ICD-10-CM | POA: Diagnosis not present

## 2017-05-20 DIAGNOSIS — Q231 Congenital insufficiency of aortic valve: Secondary | ICD-10-CM

## 2017-05-20 DIAGNOSIS — I4891 Unspecified atrial fibrillation: Secondary | ICD-10-CM

## 2017-05-20 DIAGNOSIS — Z9889 Other specified postprocedural states: Secondary | ICD-10-CM

## 2017-05-20 LAB — POCT INR: INR: 2.7

## 2017-05-20 NOTE — Therapy (Signed)
[facility] [address] [place], [place], [address] Phone: [phone]   Fax:  [phone]  Physical Therapy Treatment  Patient Details  Name: Scott [name] MRN: [id number] Date of Birth: 11-26-1945 Referring Provider: Meridee Score MD   Encounter Date: 05/19/2017      PT End of Session - 05/19/17 1122    Visit Number 7   Number of Visits 33   Date for PT Re-Evaluation 06/18/17   Authorization Type Medicare & G-codes every 10th visit    PT Start Time 1103   PT Stop Time 1152   PT Time Calculation (min) 49 min   Equipment Utilized During Treatment Gait belt   Activity Tolerance Patient tolerated treatment well   Behavior During Therapy [facility] for tasks assessed/performed      Past Medical History:  Diagnosis Date  . Arthritis    "hips, shoulders; knees; back" (10/20/2016)  . Bicuspid aortic valve   . BPH (benign prostatic hypertrophy)   . Carpal tunnel syndrome of right wrist   . Coronary artery disease involving native coronary artery of native heart with unstable angina pectoris ([place]) 10/20/2016  . Diverticulitis   . GERD (gastroesophageal reflux disease)   . Gout   . Heart murmur   . Hyperlipemia   . Hypertension   . Postoperative atrial fibrillation ([place]) 10/24/2016  . RLS (restless legs syndrome)   . S/P aortic valve replacement with bioprosthetic valve 10/22/2016   25 mm [facility] Ease bovine pericardial bioprosthetic tissue valve  . S/P ascending aortic aneurysm repair 10/22/2016   28 mm supracoronary straight graft replacement of ascending thoracic aortic aneurysm  . S/P CABG x 3 10/22/2016   Sequential LIMA to Diag and LAD, SVG to distal LAD, open vein harvest right thigh  . S/P mitral valve repair 10/22/2016   Artificial Gore-tex neochord placement x6 - posterior annuloplasty band placed but removed due to systolic anterior motion of mitral valve  . Severe aortic stenosis   . Snores    Never  been tested for sleep apnea  . Thoracic ascending aortic aneurysm ([place]) 10/21/2016  . Thrombocytopenia ([place]) 10/22/2016  . Type II diabetes mellitus ([place])     Past Surgical History:  Procedure Laterality Date  . AMPUTATION Bilateral 12/11/2016   Procedure: AMPUTATION BELOW KNEE BILATERALLY;  Surgeon: [name], MD;  Location: [place];  Service: Orthopedics;  Laterality: Bilateral;  . AMPUTATION Bilateral 12/11/2016   Procedure: AMPUTATION BILATERAL HANDS EXCEPT RIGHT THUMB;  Surgeon: [name], MD;  Location: [place];  Service: Orthopedics;  Laterality: Bilateral;  . AORTIC VALVE REPLACEMENT N/A 10/22/2016   Procedure: AORTIC VALVE REPLACEMENT (AVR) WITH SIZE 25 MM MAGNA EASE PERICARDIAL BIOPROSTHESIS - AORTIC;  Surgeon: Rexene Alberts, MD;  Location: [place];  Service: Open Heart Surgery;  Laterality: N/A;  . BONE EXCISION Right 02/01/2017   Procedure: EXCISION RIGHT INDEX METACARPAL HEAD;  Surgeon: [name], MD;  Location: [place];  Service: Orthopedics;  Laterality: Right;  . CARDIAC CATHETERIZATION N/A 10/20/2016   Procedure: Right/Left Heart Cath and Coronary Angiography;  Surgeon: Leonie Man, MD;  Location: [place] CV LAB;  Service: Cardiovascular;  Laterality: N/A;  . CARPAL TUNNEL RELEASE Right 11/28/2013   Procedure: RIGHT WRIST CARPAL TUNNEL RELEASE;  Surgeon: Lorn Junes, MD;  Location: [place];  Service: Orthopedics;  Laterality: Right;  . CATARACT EXTRACTION W/ INTRAOCULAR LENS  IMPLANT, BILATERAL Bilateral 1978  . COLONOSCOPY    .  CORONARY ARTERY BYPASS GRAFT N/A 10/22/2016   Procedure: CORONARY ARTERY BYPASS GRAFTING (CABG)x 2 WITH LIMA TO DIAGONAL, OPEN  HARVESTING OF RIGHT SAPHENOUS VEIN FOR VEIN GRAFT TO LAD;  Surgeon: Rexene Alberts, MD;  Location: [place];  Service: Open Heart Surgery;  Laterality: N/A;  . FRACTURE SURGERY    . INGUINAL HERNIA REPAIR Right 1998  . IR GENERIC HISTORICAL  12/07/2016   IR US GUIDE VASC ACCESS RIGHT 12/07/2016  Aletta Edouard, MD [facility]INTER[facility]  . IR GENERIC HISTORICAL  12/07/2016   IR RADIOLOGY PERIPHERAL GUIDED IV START 12/07/2016 Aletta Edouard, MD [facility]  . IR GENERIC HISTORICAL  12/07/2016   IR GASTROSTOMY TUBE MOD SED 12/07/2016 Aletta Edouard, MD [facility]  . LIPOMA EXCISION Right 2008   "side of my head"  . MITRAL VALVE REPAIR N/A 10/22/2016   Procedure: MITRAL VALVE REPAIR (MVR) WITH SIZE 30 SORIN ANNULOFLEX ANNULOPLASTY RING WITH SUBSEQUENT REMOVAL OF RING;  Surgeon: Rexene Alberts, MD;  Location: [place];  Service: Open Heart Surgery;  Laterality: N/A;  . PENECTOMY  2007   Peyronie's disease   . PENILE PROSTHESIS IMPLANT  2009  . REMOVAL OF PENILE PROSTHESIS N/A 02/01/2017   Procedure: REMOVAL OF PENILE PROSTHESIS;  Surgeon: Kathie Rhodes, MD;  Location: [place];  Service: Urology;  Laterality: N/A;  . SHOULDER OPEN ROTATOR CUFF REPAIR Right 2006  . STERNAL CLOSURE N/A 10/26/2016   Procedure: STERNAL WASHOUT AND DELAYED PRIMARY CLOSURE;  Surgeon: Rexene Alberts, MD;  Location: [place];  Service: Thoracic;  Laterality: N/A;  . TEE WITHOUT CARDIOVERSION N/A 10/26/2016   Procedure: TRANSESOPHAGEAL ECHOCARDIOGRAM (TEE);  Surgeon: Rexene Alberts, MD;  Location: [place];  Service: Thoracic;  Laterality: N/A;  . TEE WITHOUT CARDIOVERSION N/A 10/22/2016   Procedure: TRANSESOPHAGEAL ECHOCARDIOGRAM (TEE);  Surgeon: Rexene Alberts, MD;  Location: [place];  Service: Open Heart Surgery;  Laterality: N/A;  . THORACIC AORTIC ANEURYSM REPAIR  10/22/2016   Procedure: ASCENDING AORTIC  ANEURYSM REPAIR (AAA) WITH 28 MM HEMASHIELD PLATINUM WOVEN DOUBLE VELOUR VASCULAR GRAFT;  Surgeon: Rexene Alberts, MD;  Location: [place];  Service: Open Heart Surgery;;  . TONSILLECTOMY  ~ 1955  . TRACHEOSTOMY TUBE PLACEMENT N/A 11/09/2016   Procedure: TRACHEOSTOMY;  Surgeon: Rexene Alberts, MD;  Location: [place];  Service: Thoracic;  Laterality: N/A;  . TRANSURETHRAL RESECTION OF PROSTATE  2005  . TRIGGER FINGER RELEASE  Bilateral    several lt and rt hands  . TRIGGER FINGER RELEASE Right 11/28/2013   Procedure: RIGHT TRIGGER FINGER  RELEASE (TENDON SHEATH INCISION);  Surgeon: Lorn Junes, MD;  Location: [place];  Service: Orthopedics;  Laterality: Right;  Marland Kitchen VIDEO BRONCHOSCOPY N/A 11/09/2016   Procedure: VIDEO BRONCHOSCOPY;  Surgeon: Rexene Alberts, MD;  Location: [place];  Service: Thoracic;  Laterality: N/A;  . WRIST FRACTURE SURGERY Right ~ 1959    There were no vitals filed for this visit.      Subjective Assessment - 05/19/17 1111    Subjective No new complaints. No falls to report. No pain    Pertinent History arthritis, bicuspid aortic valve, BPH, carpal tunnel syndrome in R wrist, CAD, diverticulitis, GERD, gout, hyperlipidemia, HTN, DM type II,thrombocytopenia, thoracic ascending aortic aneurysm, severe aortic stenosis, amputation of bilateral hands except right thumb (12/11/2016) due to gangrene, bilateral transtibial amputations (12/11/2016) due to gangrene, R and L heart cath (10/20/2016)    Limitations Standing;Walking;House hold activities   Patient Stated Goals use  prostheses to walk including stairs, outdoors, target shooting, drive   Currently in Pain? No/denies   Pain Score 0-No pain           [facility] Adult PT Treatment/Exercise - 05/19/17 1123      Transfers   Transfers Sit to Stand;Stand to Sit   Sit to Stand 4: Min assist;With upper extremity assist;From chair/3-in-1;From bed   Sit to Stand Details Verbal cues for sequencing;Verbal cues for technique;Verbal cues for precautions/safety   Sit to Stand Details (indicate cue type and reason) cues on sequencing, prosthetic foot placement and to walk his legs in as he stands up for assist in balance and stabilizing.    Stand to Sit 4: Min assist;With upper extremity assist;To chair/3-in-1;To bed   Stand to Sit Details (indicate cue type and reason) Verbal cues for sequencing;Verbal cues for technique;Verbal cues for  precautions/safety   Stand to Sit Details cues for technique to sit down.   Comments multiple stands from/to wheelchair with min guard assist. 4 stands from 19 inch high mat with up to min assist to achieve standing.      Ambulation/Gait   Ambulation/Gait Yes   Ambulation/Gait Assistance 4: Min guard;4: Min assist   Ambulation/Gait Assistance Details side stepping in narrow space for bathroom simulation with min assist for RW negotiation.     Ambulation Distance (Feet) 10 Feet  x4 reps   Assistive device Prostheses   Gait Pattern Step-to pattern   Ambulation Surface Level;Indoor     Therapeutic Activites    Other Therapeutic Activities static standing with RW support: standing with single UE support x 2 minutes with supervision; reaching forward at least 5 inches out of base of support and down to just past knees with single UE support with supervision.       Prosthetics   Current prosthetic wear tolerance (days/week)  daily    Donning Prosthesis Minimal assist   Doffing Prosthesis Minimal assist                  PT Short Term Goals - 05/19/17 1123      PT SHORT TERM GOAL #1   Title Patient and patient's caregiver (due to patient's bilateral hand amputations) will verbalize understanding of proper cleaning, donning, and doffing of prostheses. (TARGET DATE: 05/21/2017)    Baseline MET 05/17/2017   Period Weeks   Status Achieved     PT SHORT TERM GOAL #2   Title Patient will tolerate wear 7 days/week for 6hrs total per day without signs of decline in skin integrity issues. (TARGET DATE: 05/21/2017)    Baseline MET 05/17/2017   Status Achieved     PT SHORT TERM GOAL #3   Title Patient will demonstrate static standing balance for 2 minutes and stationery dynamic of head turns to scan with prostheses and unilateral UE support and supervision to indicate an increase in balance and a decrease in falls risk. (TARGET DATE: 05/21/2017)    Status Achieved     PT SHORT TERM GOAL #4    Title Patient will demonstrate ability to reach anteriorly 5 inches and to knee level towards the ground with unilateral UE support without a loss of balance to indicate a decrease in his risk of falling. (TARGET DATE: 05/21/2017)    Status Achieved     PT SHORT TERM GOAL #5   Title Patient will ambulate 200 feet with min guard with bilateral platform RW to indicate a decrease in his risk of falling. (TARGET DATE: 05/21/2017)  Baseline MET 05/17/2017   Status Achieved           PT Long Term Goals - 04/28/17 1638      PT LONG TERM GOAL #1   Title Patient modified independently donnes & doffes, adjusts ply socks and pt / wife verbalize understanding of proper prosthetic care to ensure safe use of prostheses.  (Target Date: 08/20/2017)   Time 4   Period Months   Status On-going     PT LONG TERM GOAL #2   Title Patient will tolerate prostheses wear >90% of awake hours without skin issues or limb pain to demonstrate a decrease in his risk of falling. (Target Date: 08/20/2017)   Time 4   Period Months   Status On-going     PT LONG TERM GOAL #3   Title Patient performs standing balance activities with intermittent UE support reaching 10", picking up objects from floor and looks over shoulders with weight shift, trunk rotation with prostheses independently. (Target Date: 08/20/2017)     Time 4   Period Weeks   Status On-going     PT LONG TERM GOAL #4   Title Patient will ambulate 1000 feet including outdoor surfaces with LRAD and prostheses modified independently to enable community mobility.  (Target Date: 08/20/2017)   Time 4   Period Months   Status On-going     PT LONG TERM GOAL #5   Title Patient will negotiate ramp/curbs and stairs with LRAD and prostheses modified independent for community access. (Target Date: 08/20/2017)   Time 4   Period Months   Status On-going     PT LONG TERM GOAL #6   Title Patient's gait velocity will be >/= 1.3f/s to indicate a limited community  ambulator. (Target Date: 08/20/2017)   Time 4   Period Weeks   Status On-going     PT LONG TERM GOAL #7   Title Patient reports Activities of Balance Confindence score using FOTO >25% to indicate greater confidence in his balance. (Target Date: 08/20/2017)   Time 4   Period Months   Status On-going            Plan - 05/19/17 1122    Clinical Impression Statement Today's skilled session focused on checking remaining STGs with them being met today. Remainder of session addressed sit<>stand transfers from low surfaces to simulate toilet height at home (19 inches) and on side stepping with RW left<>right to simulate getting in/out of bathroom at home. Pt also now has drainage present from wound on distal end of left limb and open area on inside thigh incision on right side. Both covered wit Tegederm today and PT to recheck them on next visit Monday. Pt is progressing toward goals and should benefit from continued PT to progress toward unmet goals.    Rehab Potential Good   Clinical Impairments Affecting Rehab Potential arthritis, bicuspid aortic valve, BPH, carpal tunnel syndrome in R wrist, CAD, diverticulitis, GERD, gout, hyperlipidemia, HTN, DM type II,thrombocytopenia, thoracic ascending aortic aneurysm, severe aortic stenosis, amputation of bilateral hands except right thumb (12/11/2016) due to gangrene, bilateral transtibial amputations (12/11/2016) due to gangrene, R and L heart cath (10/20/2016)    PT Frequency 2x / week   PT Duration Other (comment)  16 weeks   PT Treatment/Interventions ADLs/Self Care Home Management;Neuromuscular re-education;Balance training;Therapeutic exercise;Therapeutic activities;Functional mobility training;Stair training;Gait training;DME Instruction;Patient/family education;Prosthetic Training   PT Next Visit Plan progress prosthetic gait training, including barriers. continue to work on unsupported standing and balance.  Consulted and Agree with Plan of Care  Patient;Family member/caregiver   Family Member Consulted daughter and grand-daughters      Patient will benefit from skilled therapeutic intervention in order to improve the following deficits and impairments:  Abnormal gait, Decreased activity tolerance, Decreased balance, Decreased coordination, Decreased range of motion, Decreased mobility, Decreased knowledge of use of DME, Decreased knowledge of precautions, Decreased endurance, Decreased skin integrity, Decreased scar mobility, Decreased strength, Difficulty walking, Postural dysfunction, Prosthetic Dependency  Visit Diagnosis: Other symptoms and signs involving the musculoskeletal system  Unsteadiness on feet  Other abnormalities of gait and mobility  Muscle weakness (generalized)     Problem List Patient Active Problem List   Diagnosis Date Noted  . Amputation of both hands with complication, subsequent encounter 03/01/2017  . Diabetes ([place]) 02/19/2017  . Status post bilateral below knee amputation ([place]) 02/09/2017  . Wound disruption, post-op, skin, sequela 02/09/2017  . Debility 02/05/2017  . Ganglion upper arm, right   . Thrombosis of both upper extremities   . Suspected heparin induced thrombocytopenia (HIT) in hospitalized patient ([place])   . Gangrene of lower extremity ([place])   . Heparin induced thrombocytopenia (HIT) ([place])   . Tracheostomy status ([place])   . Chest tube in place   . Atherosclerosis of native arteries of extremities with gangrene, left leg ([place])   . Atherosclerosis of native arteries of extremities with gangrene, right leg ([place])   . Acute on chronic diastolic CHF (congestive heart failure) ([place])   . Enteritis due to Clostridium difficile   . Anasarca   . FUO (fever of unknown origin)   . Acute encephalopathy   . Elevated LFTs   . DIC (disseminated intravascular coagulation) ([place]) 10/28/2016  . Postoperative atrial fibrillation ([place]) 10/24/2016  . Cardiogenic shock ([place])   . Mitral regurgitation  due to cusp prolapse 10/22/2016  . S/P aortic valve replacement with bioprosthetic valve 10/22/2016  . S/P ascending aortic aneurysm repair 10/22/2016  . S/P mitral valve repair 10/22/2016  . S/P CABG x 3 10/22/2016  . Thrombocytopenia ([place]) 10/22/2016  . Acute combined systolic and diastolic congestive heart failure ([place]) 10/22/2016  . Acute respiratory failure ([place]) 10/22/2016  . Thoracic ascending aortic aneurysm ([place]) 10/21/2016  . Coronary artery disease involving native coronary artery of native heart with unstable angina pectoris ([place]) 10/20/2016  . Severe aortic stenosis by prior echocardiography 10/19/2016  . Unstable angina ([place]) 10/19/2016  . Aortic valve stenosis 10/19/2016  . Bicuspid aortic valve 10/19/2016  . Trigger ring finger of right hand   . Wears glasses   . Gout   . Hypertension   . Carpal tunnel syndrome of right wrist   . Arthritis   . Snores     [place], [place], [facility] [address], [place], [place] [address] [phone] 05/20/17, 2:45 PM   Name: Scott [name] MRN: [id number] Date of Birth: 15-Oct-1946

## 2017-05-24 ENCOUNTER — Ambulatory Visit: Payer: Medicare Other | Attending: Orthopedic Surgery | Admitting: Physical Therapy

## 2017-05-24 ENCOUNTER — Encounter: Payer: Self-pay | Admitting: Physical Therapy

## 2017-05-24 DIAGNOSIS — R2681 Unsteadiness on feet: Secondary | ICD-10-CM | POA: Insufficient documentation

## 2017-05-24 DIAGNOSIS — R29898 Other symptoms and signs involving the musculoskeletal system: Secondary | ICD-10-CM | POA: Insufficient documentation

## 2017-05-24 DIAGNOSIS — R293 Abnormal posture: Secondary | ICD-10-CM | POA: Insufficient documentation

## 2017-05-24 DIAGNOSIS — M79642 Pain in left hand: Secondary | ICD-10-CM | POA: Diagnosis not present

## 2017-05-24 DIAGNOSIS — R2689 Other abnormalities of gait and mobility: Secondary | ICD-10-CM | POA: Insufficient documentation

## 2017-05-24 DIAGNOSIS — M79641 Pain in right hand: Secondary | ICD-10-CM | POA: Insufficient documentation

## 2017-05-24 DIAGNOSIS — M6281 Muscle weakness (generalized): Secondary | ICD-10-CM | POA: Diagnosis not present

## 2017-05-24 DIAGNOSIS — R208 Other disturbances of skin sensation: Secondary | ICD-10-CM | POA: Diagnosis not present

## 2017-05-24 NOTE — Therapy (Signed)
[facility] [address] [place], [place], [address] Phone: [phone]   Fax:  [phone]  Physical Therapy Treatment  Patient Details  Name: Scott [name] MRN: [id number] Date of Birth: 1946/08/06 Referring Provider: Meridee Score MD   Encounter Date: 05/24/2017      PT End of Session - 05/24/17 1154    Visit Number 8   Number of Visits 33   Date for PT Re-Evaluation 06/18/17   Authorization Type Medicare & G-codes every 10th visit    PT Start Time 1100   PT Stop Time 1148   PT Time Calculation (min) 48 min   Equipment Utilized During Treatment Gait belt   Activity Tolerance Patient tolerated treatment well   Behavior During Therapy [facility] for tasks assessed/performed      Past Medical History:  Diagnosis Date  . Arthritis    "hips, shoulders; knees; back" (10/20/2016)  . Bicuspid aortic valve   . BPH (benign prostatic hypertrophy)   . Carpal tunnel syndrome of right wrist   . Coronary artery disease involving native coronary artery of native heart with unstable angina pectoris ([place]) 10/20/2016  . Diverticulitis   . GERD (gastroesophageal reflux disease)   . Gout   . Heart murmur   . Hyperlipemia   . Hypertension   . Postoperative atrial fibrillation ([place]) 10/24/2016  . RLS (restless legs syndrome)   . S/P aortic valve replacement with bioprosthetic valve 10/22/2016   25 mm [facility] Ease bovine pericardial bioprosthetic tissue valve  . S/P ascending aortic aneurysm repair 10/22/2016   28 mm supracoronary straight graft replacement of ascending thoracic aortic aneurysm  . S/P CABG x 3 10/22/2016   Sequential LIMA to Diag and LAD, SVG to distal LAD, open vein harvest right thigh  . S/P mitral valve repair 10/22/2016   Artificial Gore-tex neochord placement x6 - posterior annuloplasty band placed but removed due to systolic anterior motion of mitral valve  . Severe aortic stenosis   . Snores    Never  been tested for sleep apnea  . Thoracic ascending aortic aneurysm ([place]) 10/21/2016  . Thrombocytopenia ([place]) 10/22/2016  . Type II diabetes mellitus ([place])     Past Surgical History:  Procedure Laterality Date  . AMPUTATION Bilateral 12/11/2016   Procedure: AMPUTATION BELOW KNEE BILATERALLY;  Surgeon: [name], MD;  Location: [place];  Service: Orthopedics;  Laterality: Bilateral;  . AMPUTATION Bilateral 12/11/2016   Procedure: AMPUTATION BILATERAL HANDS EXCEPT RIGHT THUMB;  Surgeon: [name], MD;  Location: [place];  Service: Orthopedics;  Laterality: Bilateral;  . AORTIC VALVE REPLACEMENT N/A 10/22/2016   Procedure: AORTIC VALVE REPLACEMENT (AVR) WITH SIZE 25 MM MAGNA EASE PERICARDIAL BIOPROSTHESIS - AORTIC;  Surgeon: Rexene Alberts, MD;  Location: [place];  Service: Open Heart Surgery;  Laterality: N/A;  . BONE EXCISION Right 02/01/2017   Procedure: EXCISION RIGHT INDEX METACARPAL HEAD;  Surgeon: [name], MD;  Location: [place];  Service: Orthopedics;  Laterality: Right;  . CARDIAC CATHETERIZATION N/A 10/20/2016   Procedure: Right/Left Heart Cath and Coronary Angiography;  Surgeon: Leonie Man, MD;  Location: [place] [facility];  Service: Cardiovascular;  Laterality: N/A;  . CARPAL TUNNEL RELEASE Right 11/28/2013   Procedure: RIGHT WRIST CARPAL TUNNEL RELEASE;  Surgeon: Lorn Junes, MD;  Location: [place];  Service: Orthopedics;  Laterality: Right;  . CATARACT EXTRACTION W/ INTRAOCULAR LENS  IMPLANT, BILATERAL Bilateral 1978  . COLONOSCOPY    .  CORONARY ARTERY BYPASS GRAFT N/A 10/22/2016   Procedure: CORONARY ARTERY BYPASS GRAFTING (CABG)x 2 WITH LIMA TO DIAGONAL, OPEN  HARVESTING OF RIGHT SAPHENOUS VEIN FOR VEIN GRAFT TO LAD;  Surgeon: Rexene Alberts, MD;  Location: [place];  Service: Open Heart Surgery;  Laterality: N/A;  . FRACTURE SURGERY    . INGUINAL HERNIA REPAIR Right 1998  . IR GENERIC HISTORICAL  12/07/2016   IR US GUIDE VASC ACCESS RIGHT 12/07/2016  Aletta Edouard, MD [facility]  . IR GENERIC HISTORICAL  12/07/2016   IR RADIOLOGY PERIPHERAL GUIDED IV START 12/07/2016 Aletta Edouard, MD [facility]  . IR GENERIC HISTORICAL  12/07/2016   IR GASTROSTOMY TUBE MOD SED 12/07/2016 Aletta Edouard, MD [facility]  . LIPOMA EXCISION Right 2008   "side of my head"  . MITRAL VALVE REPAIR N/A 10/22/2016   Procedure: MITRAL VALVE REPAIR (MVR) WITH SIZE 30 SORIN ANNULOFLEX ANNULOPLASTY RING WITH SUBSEQUENT REMOVAL OF RING;  Surgeon: Rexene Alberts, MD;  Location: [place];  Service: Open Heart Surgery;  Laterality: N/A;  . PENECTOMY  2007   Peyronie's disease   . PENILE PROSTHESIS IMPLANT  2009  . REMOVAL OF PENILE PROSTHESIS N/A 02/01/2017   Procedure: REMOVAL OF PENILE PROSTHESIS;  Surgeon: Kathie Rhodes, MD;  Location: [place];  Service: Urology;  Laterality: N/A;  . SHOULDER OPEN ROTATOR CUFF REPAIR Right 2006  . STERNAL CLOSURE N/A 10/26/2016   Procedure: STERNAL WASHOUT AND DELAYED PRIMARY CLOSURE;  Surgeon: Rexene Alberts, MD;  Location: [place];  Service: Thoracic;  Laterality: N/A;  . TEE WITHOUT CARDIOVERSION N/A 10/26/2016   Procedure: TRANSESOPHAGEAL ECHOCARDIOGRAM (TEE);  Surgeon: Rexene Alberts, MD;  Location: [place];  Service: Thoracic;  Laterality: N/A;  . TEE WITHOUT CARDIOVERSION N/A 10/22/2016   Procedure: TRANSESOPHAGEAL ECHOCARDIOGRAM (TEE);  Surgeon: Rexene Alberts, MD;  Location: [place];  Service: Open Heart Surgery;  Laterality: N/A;  . THORACIC AORTIC ANEURYSM REPAIR  10/22/2016   Procedure: ASCENDING AORTIC  ANEURYSM REPAIR (AAA) WITH 28 MM HEMASHIELD PLATINUM WOVEN DOUBLE VELOUR VASCULAR GRAFT;  Surgeon: Rexene Alberts, MD;  Location: [place];  Service: Open Heart Surgery;;  . TONSILLECTOMY  ~ 1955  . TRACHEOSTOMY TUBE PLACEMENT N/A 11/09/2016   Procedure: TRACHEOSTOMY;  Surgeon: Rexene Alberts, MD;  Location: [place];  Service: Thoracic;  Laterality: N/A;  . TRANSURETHRAL RESECTION OF PROSTATE  2005  . TRIGGER FINGER RELEASE  Bilateral    several lt and rt hands  . TRIGGER FINGER RELEASE Right 11/28/2013   Procedure: RIGHT TRIGGER FINGER  RELEASE (TENDON SHEATH INCISION);  Surgeon: Lorn Junes, MD;  Location: [place];  Service: Orthopedics;  Laterality: Right;  Marland Kitchen VIDEO BRONCHOSCOPY N/A 11/09/2016   Procedure: VIDEO BRONCHOSCOPY;  Surgeon: Rexene Alberts, MD;  Location: [place];  Service: Thoracic;  Laterality: N/A;  . WRIST FRACTURE SURGERY Right ~ 1959    There were no vitals filed for this visit.      Subjective Assessment - 05/24/17 1108    Subjective Patient presents to PT for the first time on the RW. Patient's daughter reports patient is now able to side step to enter/exit bathroom since last visit. Denies any changes or falls.    Pertinent History arthritis, bicuspid aortic valve, BPH, carpal tunnel syndrome in R wrist, CAD, diverticulitis, GERD, gout, hyperlipidemia, HTN, DM type II,thrombocytopenia, thoracic ascending aortic aneurysm, severe aortic stenosis, amputation of bilateral hands except right thumb (12/11/2016) due to gangrene, bilateral transtibial amputations (  12/11/2016) due to gangrene, R and L heart cath (10/20/2016)    Limitations Standing;Walking;House hold activities   Patient Stated Goals use prostheses to walk including stairs, outdoors, target shooting, drive   Currently in Pain? No/denies                         [facility] Adult PT Treatment/Exercise - 05/24/17 1100      Transfers   Transfers Sit to Stand;Stand to Sit   Sit to Stand 4: Min assist;With upper extremity assist;From chair/3-in-1;From bed  from armless chair    Sit to Stand Details Verbal cues for sequencing;Verbal cues for technique;Verbal cues for precautions/safety   Sit to Stand Details (indicate cue type and reason) Requires demo and cueing for proper technique and hand placement to push up    Stand to Sit 4: Min assist;With upper extremity assist;To chair/3-in-1;To bed  from armless  chair    Stand to Sit Details (indicate cue type and reason) Verbal cues for sequencing;Verbal cues for technique;Verbal cues for precautions/safety   Stand to Sit Details Requires demo and cueing for technique and hand placement    Comments Performed multiple repetitions of sit <> stand transfers from armless chair and from a 24 inch stool with cueing for foot and hand placement.     Ambulation/Gait   Ambulation/Gait Yes   Ambulation/Gait Assistance 5: Supervision;3: Mod assist;2: Max assist   Ambulation/Gait Assistance Details Requires S when ambulating with RW. Requires mod A (hand held assistance) and intermittent max A when patient misplaces cane. Requires cueing for sequencing of cane and prostheses.   Ambulation Distance (Feet) 75 Feet  64ft with RW; 52ft with SPC + hand held assist    Assistive device Prostheses;Rolling walker;Straight cane  straight cane with quad tip    Gait Pattern Step-to pattern   Ambulation Surface Level;Indoor   Stairs Yes   Stairs Assistance 4: Min guard   Stairs Assistance Details (indicate cue type and reason) Requires demo and cueing for sequencing with prostheses and hand placement on rail   Stair Management Technique One rail Left;Step to pattern;Forwards  alternating pattern + 2 rails + fwd   Number of Stairs 4  x3 sets    Ramp 4: Min assist   Ramp Details (indicate cue type and reason) Requires cueing for weight shift and posture    Curb 4: Min assist  requires assistance to advance RW    Curb Details (indicate cue type and reason) Requires RW stabilization and cueing for sequencing      Therapeutic Activites    Other Therapeutic Activities --     Prosthetics   Prosthetic Care Comments  Patient presents to PT without any socks donned.   Current prosthetic wear tolerance (days/week)  daily    Current prosthetic wear tolerance (#hours/day)  3hrs on, 2 hrs off 2-3x/day   Residual limb condition  Wounds on L residual limb have Tegaderm in place  with decreased drainage and reddness compared to last visit. On R residual limb, Tegaderm in place over proximal medial thigh wound. PT educated patient could use bandage since wound is located outside of socket.   Education Provided Residual limb care;Proper wear schedule/adjustment;Other (comment)  scar massage    Person(s) Educated Patient;Child(ren)   Education Method Explanation   Education Method Verbalized understanding                PT Education - 05/24/17 1136    Education provided Yes  Education Details utilize stool on platform/turn on patient's stairs (7 steps up) to take a seated rest break before finishing the second flight of stairs and educated on use of stool to complete ADLs at countertop; ambulating on stairs with 1 rail on L side (patient's daughter took pictures for proper foot and hand placement)   Person(s) Educated Patient;Child(ren)   Methods Explanation;Demonstration;Tactile cues;Verbal cues   Comprehension Verbalized understanding;Returned demonstration          PT Short Term Goals - 05/24/17 1158      PT SHORT TERM GOAL #1   Title Patient will demonstrate ability to complete stairs with 1 rail on the L and supervision to indicate a decrease in risk of falling. (TARGET DATE: 06/18/2017)    Time 4   Period Weeks   Status New     PT SHORT TERM GOAL #2   Title Patient will demonstrate ability to ambulate 110ft with cane and min A (hand held assistance) to indicate a decrease in risk of falling. (TARGET DATE: 06/18/2017)    Time 4   Period Weeks   Status New     PT SHORT TERM GOAL #3   Title Patient will demonstrate ability to perform sit <> stand transfer from armless chair with S to indicate improvement in functional mobility and a decrease in risk of falling. (TARGET DATE: 06/18/2017)    Time 4   Period Weeks   Status New     PT SHORT TERM GOAL #4   Title Patient will demonstrate ability to reach anteriorly by 10 inches and to ankle level  towards the ground with unilateral UE support without a loss of balance to indicate a decrease in his risk of falling. (TARGET DATE: 06/18/2017)    Time 4   Period Weeks   Status New           PT Long Term Goals - 04/28/17 1638      PT LONG TERM GOAL #1   Title Patient modified independently donnes & doffes, adjusts ply socks and pt / wife verbalize understanding of proper prosthetic care to ensure safe use of prostheses.  (Target Date: 08/20/2017)   Time 4   Period Months   Status On-going     PT LONG TERM GOAL #2   Title Patient will tolerate prostheses wear >90% of awake hours without skin issues or limb pain to demonstrate a decrease in his risk of falling. (Target Date: 08/20/2017)   Time 4   Period Months   Status On-going     PT LONG TERM GOAL #3   Title Patient performs standing balance activities with intermittent UE support reaching 10", picking up objects from floor and looks over shoulders with weight shift, trunk rotation with prostheses independently. (Target Date: 08/20/2017)     Time 4   Period Weeks   Status On-going     PT LONG TERM GOAL #4   Title Patient will ambulate 1000 feet including outdoor surfaces with LRAD and prostheses modified independently to enable community mobility.  (Target Date: 08/20/2017)   Time 4   Period Months   Status On-going     PT LONG TERM GOAL #5   Title Patient will negotiate ramp/curbs and stairs with LRAD and prostheses modified independent for community access. (Target Date: 08/20/2017)   Time 4   Period Months   Status On-going     PT LONG TERM GOAL #6   Title Patient's gait velocity will be >/= 1.23ft/s to indicate a  limited community ambulator. (Target Date: 08/20/2017)   Time 4   Period Weeks   Status On-going     PT LONG TERM GOAL #7   Title Patient reports Activities of Balance Confindence score using FOTO >25% to indicate greater confidence in his balance. (Target Date: 08/20/2017)   Time 4   Period Months   Status  On-going               Plan - 05/24/17 1156    Clinical Impression Statement Today's skilled PT session focused on advancing patient's prosthetic gait including stairs with 1 rail (L side) to simulate the patient's home. Practiced transferring from armless chair and from 24 inch stool. PT introduced ambulation with cane with quad tip and hand held assistance from PT. Patient is making progress towards goals, and will benefit from continued skilled PT to address functional mobility deficits.    Rehab Potential Good   Clinical Impairments Affecting Rehab Potential arthritis, bicuspid aortic valve, BPH, carpal tunnel syndrome in R wrist, CAD, diverticulitis, GERD, gout, hyperlipidemia, HTN, DM type II,thrombocytopenia, thoracic ascending aortic aneurysm, severe aortic stenosis, amputation of bilateral hands except right thumb (12/11/2016) due to gangrene, bilateral transtibial amputations (12/11/2016) due to gangrene, R and L heart cath (10/20/2016)    PT Frequency 2x / week   PT Duration Other (comment)  16 weeks   PT Treatment/Interventions ADLs/Self Care Home Management;Neuromuscular re-education;Balance training;Therapeutic exercise;Therapeutic activities;Functional mobility training;Stair training;Gait training;DME Instruction;Patient/family education;Prosthetic Training   PT Next Visit Plan progress prosthetic gait training including use of cane with quad tip + mod A hand held assist from PT and barriers with RW; progress balance activities including multi direction reaching;    Consulted and Agree with Plan of Care Patient;Family member/caregiver   Family Member Consulted daughter       Patient will benefit from skilled therapeutic intervention in order to improve the following deficits and impairments:  Abnormal gait, Decreased activity tolerance, Decreased balance, Decreased coordination, Decreased range of motion, Decreased mobility, Decreased knowledge of use of DME, Decreased  knowledge of precautions, Decreased endurance, Decreased skin integrity, Decreased scar mobility, Decreased strength, Difficulty walking, Postural dysfunction, Prosthetic Dependency  Visit Diagnosis: Unsteadiness on feet  Other abnormalities of gait and mobility  Muscle weakness (generalized)     Problem List Patient Active Problem List   Diagnosis Date Noted  . Amputation of both hands with complication, subsequent encounter 03/01/2017  . Diabetes ([place]) 02/19/2017  . Status post bilateral below knee amputation ([place]) 02/09/2017  . Wound disruption, post-op, skin, sequela 02/09/2017  . Debility 02/05/2017  . Ganglion upper arm, right   . Thrombosis of both upper extremities   . Suspected heparin induced thrombocytopenia (HIT) in hospitalized patient ([place])   . Gangrene of lower extremity ([place])   . Heparin induced thrombocytopenia (HIT) ([place])   . Tracheostomy status ([place])   . Chest tube in place   . Atherosclerosis of native arteries of extremities with gangrene, left leg ([place])   . Atherosclerosis of native arteries of extremities with gangrene, right leg ([place])   . Acute on chronic diastolic CHF (congestive heart failure) ([place])   . Enteritis due to Clostridium difficile   . Anasarca   . FUO (fever of unknown origin)   . Acute encephalopathy   . Elevated LFTs   . DIC (disseminated intravascular coagulation) ([place]) 10/28/2016  . Postoperative atrial fibrillation ([place]) 10/24/2016  . Cardiogenic shock ([place])   . Mitral regurgitation due to cusp prolapse 10/22/2016  . S/P aortic  valve replacement with bioprosthetic valve 10/22/2016  . S/P ascending aortic aneurysm repair 10/22/2016  . S/P mitral valve repair 10/22/2016  . S/P CABG x 3 10/22/2016  . Thrombocytopenia ([place]) 10/22/2016  . Acute combined systolic and diastolic congestive heart failure ([place]) 10/22/2016  . Acute respiratory failure ([place]) 10/22/2016  . Thoracic ascending aortic aneurysm ([place]) 10/21/2016  . Coronary  artery disease involving native coronary artery of native heart with unstable angina pectoris ([place]) 10/20/2016  . Severe aortic stenosis by prior echocardiography 10/19/2016  . Unstable angina ([place]) 10/19/2016  . Aortic valve stenosis 10/19/2016  . Bicuspid aortic valve 10/19/2016  . Trigger ring finger of right hand   . Wears glasses   . Gout   . Hypertension   . Carpal tunnel syndrome of right wrist   . Arthritis   . Snores     [place], SPT  05/24/2017, 12:34 PM  [facility] [address] [place], [place], [address] Phone: [phone]   Fax:  [phone]  Name: Scott [name] MRN: [id number] Date of Birth: 24-Nov-1945

## 2017-05-25 ENCOUNTER — Ambulatory Visit: Payer: Medicare Other | Admitting: Occupational Therapy

## 2017-05-25 ENCOUNTER — Encounter: Payer: Self-pay | Admitting: Occupational Therapy

## 2017-05-25 DIAGNOSIS — R2689 Other abnormalities of gait and mobility: Secondary | ICD-10-CM | POA: Diagnosis not present

## 2017-05-25 DIAGNOSIS — R208 Other disturbances of skin sensation: Secondary | ICD-10-CM

## 2017-05-25 DIAGNOSIS — M79641 Pain in right hand: Secondary | ICD-10-CM | POA: Diagnosis not present

## 2017-05-25 DIAGNOSIS — M6281 Muscle weakness (generalized): Secondary | ICD-10-CM | POA: Diagnosis not present

## 2017-05-25 DIAGNOSIS — R29898 Other symptoms and signs involving the musculoskeletal system: Secondary | ICD-10-CM

## 2017-05-25 DIAGNOSIS — R2681 Unsteadiness on feet: Secondary | ICD-10-CM

## 2017-05-25 DIAGNOSIS — M79642 Pain in left hand: Secondary | ICD-10-CM | POA: Diagnosis not present

## 2017-05-25 NOTE — Therapy (Signed)
[facility] [address] [place], [place], [address] Phone: [phone]   Fax:  [phone]  Occupational Therapy Treatment  Patient Details  Name: Scott [name] MRN: [id number] Date of Birth: [birth year] Referring Provider: Dr. Meridee Score  Encounter Date: 05/25/2017      OT End of Session - 05/25/17 1702    Visit Number 2   Number of Visits 16   Date for OT Re-Evaluation 07/15/17   Authorization Type medicare pt will need G code and PN every 10th visit   Authorization Time Period 60 days   Authorization - Visit Number 2   Authorization - Number of Visits 10   OT Start Time 3557   OT Stop Time 1622   OT Time Calculation (min) 50 min      Past Medical History:  Diagnosis Date  . Arthritis    "hips, shoulders; knees; back" (10/20/2016)  . Bicuspid aortic valve   . BPH (benign prostatic hypertrophy)   . Carpal tunnel syndrome of right wrist   . Coronary artery disease involving native coronary artery of native heart with unstable angina pectoris ([place]) 10/20/2016  . Diverticulitis   . GERD (gastroesophageal reflux disease)   . Gout   . Heart murmur   . Hyperlipemia   . Hypertension   . Postoperative atrial fibrillation ([place]) 10/24/2016  . RLS (restless legs syndrome)   . S/P aortic valve replacement with bioprosthetic valve 10/22/2016   25 mm [facility] Ease bovine pericardial bioprosthetic tissue valve  . S/P ascending aortic aneurysm repair 10/22/2016   28 mm supracoronary straight graft replacement of ascending thoracic aortic aneurysm  . S/P CABG x 3 10/22/2016   Sequential LIMA to Diag and LAD, SVG to distal LAD, open vein harvest right thigh  . S/P mitral valve repair 10/22/2016   Artificial Gore-tex neochord placement x6 - posterior annuloplasty band placed but removed due to systolic anterior motion of mitral valve  . Severe aortic stenosis   . Snores    Never been tested for sleep apnea  .  Thoracic ascending aortic aneurysm ([place]) 10/21/2016  . Thrombocytopenia ([place]) 10/22/2016  . Type II diabetes mellitus ([place])     Past Surgical History:  Procedure Laterality Date  . AMPUTATION Bilateral 12/11/2016   Procedure: AMPUTATION BELOW KNEE BILATERALLY;  Surgeon: [name], MD;  Location: [facility];  Service: Orthopedics;  Laterality: Bilateral;  . AMPUTATION Bilateral 12/11/2016   Procedure: AMPUTATION BILATERAL HANDS EXCEPT RIGHT THUMB;  Surgeon: [name], MD;  Location: [facility];  Service: Orthopedics;  Laterality: Bilateral;  . AORTIC VALVE REPLACEMENT N/A 10/22/2016   Procedure: AORTIC VALVE REPLACEMENT (AVR) WITH SIZE 25 MM MAGNA EASE PERICARDIAL BIOPROSTHESIS - AORTIC;  Surgeon: Rexene Alberts, MD;  Location: [facility];  Service: Open Heart Surgery;  Laterality: N/A;  . BONE EXCISION Right 02/01/2017   Procedure: EXCISION RIGHT INDEX METACARPAL HEAD;  Surgeon: [name], MD;  Location: [facility];  Service: Orthopedics;  Laterality: Right;  . CARDIAC CATHETERIZATION N/A 10/20/2016   Procedure: Right/Left Heart Cath and Coronary Angiography;  Surgeon: Leonie Man, MD;  Location: [facility];  Service: Cardiovascular;  Laterality: N/A;  . CARPAL TUNNEL RELEASE Right 11/28/2013   Procedure: RIGHT WRIST CARPAL TUNNEL RELEASE;  Surgeon: Lorn Junes, MD;  Location: [facility];  Service: Orthopedics;  Laterality: Right;  . CATARACT EXTRACTION W/ INTRAOCULAR LENS  IMPLANT, BILATERAL Bilateral 1978  . COLONOSCOPY    .  CORONARY ARTERY BYPASS GRAFT N/A 10/22/2016   Procedure: CORONARY ARTERY BYPASS GRAFTING (CABG)x 2 WITH LIMA TO DIAGONAL, OPEN  HARVESTING OF RIGHT SAPHENOUS VEIN FOR VEIN GRAFT TO LAD;  Surgeon: Rexene Alberts, MD;  Location: [facility];  Service: Open Heart Surgery;  Laterality: N/A;  . FRACTURE SURGERY    . INGUINAL HERNIA REPAIR Right 1998  . IR GENERIC HISTORICAL  12/07/2016   IR US GUIDE VASC ACCESS RIGHT 12/07/2016 Aletta Edouard, MD [facility]   . IR GENERIC HISTORICAL  12/07/2016   IR RADIOLOGY PERIPHERAL GUIDED IV START 12/07/2016 Aletta Edouard, MD [facility]  . IR GENERIC HISTORICAL  12/07/2016   IR GASTROSTOMY TUBE MOD SED 12/07/2016 Aletta Edouard, MD [facility]  . LIPOMA EXCISION Right 2008   "side of my head"  . MITRAL VALVE REPAIR N/A 10/22/2016   Procedure: MITRAL VALVE REPAIR (MVR) WITH SIZE 30 SORIN ANNULOFLEX ANNULOPLASTY RING WITH SUBSEQUENT REMOVAL OF RING;  Surgeon: Rexene Alberts, MD;  Location: [facility];  Service: Open Heart Surgery;  Laterality: N/A;  . PENECTOMY  2007   Peyronie's disease   . PENILE PROSTHESIS IMPLANT  2009  . REMOVAL OF PENILE PROSTHESIS N/A 02/01/2017   Procedure: REMOVAL OF PENILE PROSTHESIS;  Surgeon: Kathie Rhodes, MD;  Location: [facility];  Service: Urology;  Laterality: N/A;  . SHOULDER OPEN ROTATOR CUFF REPAIR Right 2006  . STERNAL CLOSURE N/A 10/26/2016   Procedure: STERNAL WASHOUT AND DELAYED PRIMARY CLOSURE;  Surgeon: Rexene Alberts, MD;  Location: [facility];  Service: Thoracic;  Laterality: N/A;  . TEE WITHOUT CARDIOVERSION N/A 10/26/2016   Procedure: TRANSESOPHAGEAL ECHOCARDIOGRAM (TEE);  Surgeon: Rexene Alberts, MD;  Location: [facility];  Service: Thoracic;  Laterality: N/A;  . TEE WITHOUT CARDIOVERSION N/A 10/22/2016   Procedure: TRANSESOPHAGEAL ECHOCARDIOGRAM (TEE);  Surgeon: Rexene Alberts, MD;  Location: [facility];  Service: Open Heart Surgery;  Laterality: N/A;  . THORACIC AORTIC ANEURYSM REPAIR  10/22/2016   Procedure: ASCENDING AORTIC  ANEURYSM REPAIR (AAA) WITH 28 MM HEMASHIELD PLATINUM WOVEN DOUBLE VELOUR VASCULAR GRAFT;  Surgeon: Rexene Alberts, MD;  Location: [facility];  Service: Open Heart Surgery;;  . TONSILLECTOMY  ~ 1955  . TRACHEOSTOMY TUBE PLACEMENT N/A 11/09/2016   Procedure: TRACHEOSTOMY;  Surgeon: Rexene Alberts, MD;  Location: [facility];  Service: Thoracic;  Laterality: N/A;  . TRANSURETHRAL RESECTION OF PROSTATE  2005  . TRIGGER FINGER RELEASE Bilateral    several lt and rt hands   . TRIGGER FINGER RELEASE Right 11/28/2013   Procedure: RIGHT TRIGGER FINGER  RELEASE (TENDON SHEATH INCISION);  Surgeon: Lorn Junes, MD;  Location: [facility];  Service: Orthopedics;  Laterality: Right;  Marland Kitchen VIDEO BRONCHOSCOPY N/A 11/09/2016   Procedure: VIDEO BRONCHOSCOPY;  Surgeon: Rexene Alberts, MD;  Location: [facility];  Service: Thoracic;  Laterality: N/A;  . WRIST FRACTURE SURGERY Right ~ 1959    There were no vitals filed for this visit.      Subjective Assessment - 05/25/17 1538    Subjective  I like those goals can we add a writing goal too?   Patient is accompained by: Family member  wife   Pertinent History see epic. Pt with Bil BKA as well as bilateral UE amputations of all fingers excepet L thumb at MCP joint due to gangrene.    Patient Stated Goals walk, drive a car and go where I want to go. Wife hopes pt can go out to eat in a restaurant  and use public bathroom and eat .    Currently in Pain? No/denies                      OT Treatments/Exercises (OP) - 05/25/17 0001      ADLs   UB Dressing Addressd buttoning using button hook attached to universal cuff and using opponens splint to complete.  Pt able to complete after demonstration and significant practice. Loaned button hook to pt in order for him to practice at home.    Writing Addressed writing using adapted universal cuff designed for writing.  Pt able to write name with 75% legibility. Will need to adapt wrting utensil to allow for more stability.  Atempted two other strategies however pt preferred adapted universal cuff.    ADL Comments Reviewed goals and POC with pt and wife - pt and wife in agreement and pt asked to add a writing goal. WIll adjust goals and resubmit to MD.                    OT Short Term Goals - 05/25/17 1659      OT SHORT TERM GOAL #1   Title PT and wife will be mod I with home activities program - 06/14/2017   Status On-going     OT SHORT TERM GOAL  #2   Title Pt will be supervision for 3 in 1 commode transfers   Status On-going     OT [place] #3   Title Pt will demonstrate ability to use opposition splint with prn with basic ADL tasks   Status On-going     OT SHORT TERM GOAL #4   Title Pt will be able to complete simple cold snack prep with min a   Status On-going           OT Long Term Goals - 05/25/17 1700      OT LONG TERM GOAL #1   Title Pt will be mod I with home activities program - 07/12/2017   Status On-going     OT LONG TERM GOAL #2   Title Pt will be mod I with toilet transfers   Status On-going     OT LONG TERM GOAL #3   Title IF pt is able to complete stairs, pt will be mod I with tub bench transfers   Status On-going     OT LONG TERM GOAL #4   Title Pt will verbalize understanding of driving eval information   Status On-going     OT LONG TERM GOAL #5   Title Pt will be mod I with toilet hygiene with AE   Status On-going     OT LONG TERM GOAL #6   Title Pt will be mod I with buttoning, zipping using AE/prostheses   Status On-going     OT LONG TERM GOAL #7   Title Pt will be mod I with cutting food with AE/prostheses   Status On-going     OT LONG TERM GOAL #8   Title Pt will be able to write at 3 sentence level with 100% legibility with AE.    Status New               Plan - 05/25/17 1700    Clinical Impression Statement Pt and wife in agreement with goals and added writing goal at pt's request.  Pt progressing toward goals.    Rehab Potential Fair   OT Frequency 2x / week  OT Duration 8 weeks   OT Treatment/Interventions Self-care/ADL training;Moist Heat;Cryotherapy;Ultrasound;Fluidtherapy;DME and/or AE instruction;Neuromuscular education;Therapeutic exercise;Therapist, nutritional;Therapeutic activities;Splinting;Patient/family education;Balance training   Plan assess use of button hook, address adapted this to be used with universal cuff. adapt writing cuff, problem  solve for toileting aide, if time address toilet transfers and tub bench transfers.    Consulted and Agree with Plan of Care Patient;Family member/caregiver   Family Member Consulted wife      Patient will benefit from skilled therapeutic intervention in order to improve the following deficits and impairments:  Abnormal gait, Decreased activity tolerance, Decreased balance, Decreased mobility, Decreased knowledge of use of DME, Difficulty walking, Impaired UE functional use, Impaired sensation, Pain, Other (comment)  Visit Diagnosis: Unsteadiness on feet  Other symptoms and signs involving the musculoskeletal system  Other disturbances of skin sensation  Pain in right hand  Pain in left hand    Problem List Patient Active Problem List   Diagnosis Date Noted  . Amputation of both hands with complication, subsequent encounter 03/01/2017  . Diabetes ([place]) 02/19/2017  . Status post bilateral below knee amputation ([place]) 02/09/2017  . Wound disruption, post-op, skin, sequela 02/09/2017  . Debility 02/05/2017  . Ganglion upper arm, right   . Thrombosis of both upper extremities   . Suspected heparin induced thrombocytopenia (HIT) in hospitalized patient ([place])   . Gangrene of lower extremity ([place])   . Heparin induced thrombocytopenia (HIT) ([place])   . Tracheostomy status ([place])   . Chest tube in place   . Atherosclerosis of native arteries of extremities with gangrene, left leg ([place])   . Atherosclerosis of native arteries of extremities with gangrene, right leg ([place])   . Acute on chronic diastolic CHF (congestive heart failure) ([place])   . Enteritis due to Clostridium difficile   . Anasarca   . FUO (fever of unknown origin)   . Acute encephalopathy   . Elevated LFTs   . DIC (disseminated intravascular coagulation) ([place]) 10/28/2016  . Postoperative atrial fibrillation ([place]) 10/24/2016  . Cardiogenic shock ([place])   . Mitral regurgitation due to cusp prolapse 10/22/2016  . S/P aortic  valve replacement with bioprosthetic valve 10/22/2016  . S/P ascending aortic aneurysm repair 10/22/2016  . S/P mitral valve repair 10/22/2016  . S/P CABG x 3 10/22/2016  . Thrombocytopenia ([place]) 10/22/2016  . Acute combined systolic and diastolic congestive heart failure ([place]) 10/22/2016  . Acute respiratory failure ([place]) 10/22/2016  . Thoracic ascending aortic aneurysm ([place]) 10/21/2016  . Coronary artery disease involving native coronary artery of native heart with unstable angina pectoris ([place]) 10/20/2016  . Severe aortic stenosis by prior echocardiography 10/19/2016  . Unstable angina ([place]) 10/19/2016  . Aortic valve stenosis 10/19/2016  . Bicuspid aortic valve 10/19/2016  . Trigger ring finger of right hand   . Wears glasses   . Gout   . Hypertension   . Carpal tunnel syndrome of right wrist   . Arthritis   . Snores     Quay Burow, OTR/L 05/25/2017, 5:05 PM  [facility] [address] [place], [place], [address] Phone: [phone]   Fax:  [phone]  Name: ANDRAS GRUNEWALD MRN: [id number] Date of Birth: 10-18-1946

## 2017-05-27 ENCOUNTER — Ambulatory Visit: Payer: Medicare Other | Admitting: Physical Therapy

## 2017-05-27 ENCOUNTER — Encounter: Payer: Self-pay | Admitting: Physical Therapy

## 2017-05-27 DIAGNOSIS — R2689 Other abnormalities of gait and mobility: Secondary | ICD-10-CM | POA: Diagnosis not present

## 2017-05-27 DIAGNOSIS — M6281 Muscle weakness (generalized): Secondary | ICD-10-CM | POA: Diagnosis not present

## 2017-05-27 DIAGNOSIS — R29898 Other symptoms and signs involving the musculoskeletal system: Secondary | ICD-10-CM

## 2017-05-27 DIAGNOSIS — M79642 Pain in left hand: Secondary | ICD-10-CM | POA: Diagnosis not present

## 2017-05-27 DIAGNOSIS — R2681 Unsteadiness on feet: Secondary | ICD-10-CM | POA: Diagnosis not present

## 2017-05-27 DIAGNOSIS — M79641 Pain in right hand: Secondary | ICD-10-CM | POA: Diagnosis not present

## 2017-05-27 NOTE — Therapy (Signed)
[facility] [address] [place], [place], [address] Phone: [phone]   Fax:  [phone]  Physical Therapy Treatment  Patient Details  Name: Scott [name] MRN: [id number] Date of Birth: [birth year] Referring Provider: Meridee Score MD   Encounter Date: 05/27/2017      PT End of Session - 05/27/17 1106    Visit Number 9   Number of Visits 33   Date for PT Re-Evaluation 06/18/17   Authorization Type Medicare & G-codes every 10th visit    PT Start Time 1102   PT Stop Time 1145   PT Time Calculation (min) 43 min   Equipment Utilized During Treatment Gait belt   Activity Tolerance Patient tolerated treatment well   Behavior During Therapy [facility] for tasks assessed/performed      Past Medical History:  Diagnosis Date  . Arthritis    "hips, shoulders; knees; back" (10/20/2016)  . Bicuspid aortic valve   . BPH (benign prostatic hypertrophy)   . Carpal tunnel syndrome of right wrist   . Coronary artery disease involving native coronary artery of native heart with unstable angina pectoris ([place]) 10/20/2016  . Diverticulitis   . GERD (gastroesophageal reflux disease)   . Gout   . Heart murmur   . Hyperlipemia   . Hypertension   . Postoperative atrial fibrillation ([place]) 10/24/2016  . RLS (restless legs syndrome)   . S/P aortic valve replacement with bioprosthetic valve 10/22/2016   25 mm [facility] Ease bovine pericardial bioprosthetic tissue valve  . S/P ascending aortic aneurysm repair 10/22/2016   28 mm supracoronary straight graft replacement of ascending thoracic aortic aneurysm  . S/P CABG x 3 10/22/2016   Sequential LIMA to Diag and LAD, SVG to distal LAD, open vein harvest right thigh  . S/P mitral valve repair 10/22/2016   Artificial Gore-tex neochord placement x6 - posterior annuloplasty band placed but removed due to systolic anterior motion of mitral valve  . Severe aortic stenosis   . Snores    Never  been tested for sleep apnea  . Thoracic ascending aortic aneurysm ([place]) 10/21/2016  . Thrombocytopenia ([place]) 10/22/2016  . Type II diabetes mellitus ([place])     Past Surgical History:  Procedure Laterality Date  . AMPUTATION Bilateral 12/11/2016   Procedure: AMPUTATION BELOW KNEE BILATERALLY;  Surgeon: [name], MD;  Location: [place];  Service: Orthopedics;  Laterality: Bilateral;  . AMPUTATION Bilateral 12/11/2016   Procedure: AMPUTATION BILATERAL HANDS EXCEPT RIGHT THUMB;  Surgeon: [name], MD;  Location: [place];  Service: Orthopedics;  Laterality: Bilateral;  . AORTIC VALVE REPLACEMENT N/A 10/22/2016   Procedure: AORTIC VALVE REPLACEMENT (AVR) WITH SIZE 25 MM MAGNA EASE PERICARDIAL BIOPROSTHESIS - AORTIC;  Surgeon: Rexene Alberts, MD;  Location: [place];  Service: Open Heart Surgery;  Laterality: N/A;  . BONE EXCISION Right 02/01/2017   Procedure: EXCISION RIGHT INDEX METACARPAL HEAD;  Surgeon: [name], MD;  Location: [place];  Service: Orthopedics;  Laterality: Right;  . CARDIAC CATHETERIZATION N/A 10/20/2016   Procedure: Right/Left Heart Cath and Coronary Angiography;  Surgeon: Leonie Man, MD;  Location: [place] [facility];  Service: Cardiovascular;  Laterality: N/A;  . CARPAL TUNNEL RELEASE Right 11/28/2013   Procedure: RIGHT WRIST CARPAL TUNNEL RELEASE;  Surgeon: Lorn Junes, MD;  Location: [place];  Service: Orthopedics;  Laterality: Right;  . CATARACT EXTRACTION W/ INTRAOCULAR LENS  IMPLANT, BILATERAL Bilateral 1978  . COLONOSCOPY    .  CORONARY ARTERY BYPASS GRAFT N/A 10/22/2016   Procedure: CORONARY ARTERY BYPASS GRAFTING (CABG)x 2 WITH LIMA TO DIAGONAL, OPEN  HARVESTING OF RIGHT SAPHENOUS VEIN FOR VEIN GRAFT TO LAD;  Surgeon: Rexene Alberts, MD;  Location: [place] [facility];  Service: Open Heart Surgery;  Laterality: N/A;  . FRACTURE SURGERY    . INGUINAL HERNIA REPAIR Right 1998  . IR GENERIC HISTORICAL  12/07/2016   IR US GUIDE VASC ACCESS RIGHT 12/07/2016  Aletta Edouard, MD [facility]  . IR GENERIC HISTORICAL  12/07/2016   IR RADIOLOGY PERIPHERAL GUIDED IV START 12/07/2016 Aletta Edouard, MD [facility]  . IR GENERIC HISTORICAL  12/07/2016   IR GASTROSTOMY TUBE MOD SED 12/07/2016 Aletta Edouard, MD [facility]  . LIPOMA EXCISION Right 2008   "side of my head"  . MITRAL VALVE REPAIR N/A 10/22/2016   Procedure: MITRAL VALVE REPAIR (MVR) WITH SIZE 30 SORIN ANNULOFLEX ANNULOPLASTY RING WITH SUBSEQUENT REMOVAL OF RING;  Surgeon: Rexene Alberts, MD;  Location: [place];  Service: Open Heart Surgery;  Laterality: N/A;  . PENECTOMY  2007   Peyronie's disease   . PENILE PROSTHESIS IMPLANT  2009  . REMOVAL OF PENILE PROSTHESIS N/A 02/01/2017   Procedure: REMOVAL OF PENILE PROSTHESIS;  Surgeon: Kathie Rhodes, MD;  Location: [place];  Service: Urology;  Laterality: N/A;  . SHOULDER OPEN ROTATOR CUFF REPAIR Right 2006  . STERNAL CLOSURE N/A 10/26/2016   Procedure: STERNAL WASHOUT AND DELAYED PRIMARY CLOSURE;  Surgeon: Rexene Alberts, MD;  Location: [place];  Service: Thoracic;  Laterality: N/A;  . TEE WITHOUT CARDIOVERSION N/A 10/26/2016   Procedure: TRANSESOPHAGEAL ECHOCARDIOGRAM (TEE);  Surgeon: Rexene Alberts, MD;  Location: [place];  Service: Thoracic;  Laterality: N/A;  . TEE WITHOUT CARDIOVERSION N/A 10/22/2016   Procedure: TRANSESOPHAGEAL ECHOCARDIOGRAM (TEE);  Surgeon: Rexene Alberts, MD;  Location: [place];  Service: Open Heart Surgery;  Laterality: N/A;  . THORACIC AORTIC ANEURYSM REPAIR  10/22/2016   Procedure: ASCENDING AORTIC  ANEURYSM REPAIR (AAA) WITH 28 MM HEMASHIELD PLATINUM WOVEN DOUBLE VELOUR VASCULAR GRAFT;  Surgeon: Rexene Alberts, MD;  Location: [place];  Service: Open Heart Surgery;;  . TONSILLECTOMY  ~ 1955  . TRACHEOSTOMY TUBE PLACEMENT N/A 11/09/2016   Procedure: TRACHEOSTOMY;  Surgeon: Rexene Alberts, MD;  Location: [place];  Service: Thoracic;  Laterality: N/A;  . TRANSURETHRAL RESECTION OF PROSTATE  2005  . TRIGGER FINGER RELEASE  Bilateral    several lt and rt hands  . TRIGGER FINGER RELEASE Right 11/28/2013   Procedure: RIGHT TRIGGER FINGER  RELEASE (TENDON SHEATH INCISION);  Surgeon: Lorn Junes, MD;  Location: [place];  Service: Orthopedics;  Laterality: Right;  Marland Kitchen VIDEO BRONCHOSCOPY N/A 11/09/2016   Procedure: VIDEO BRONCHOSCOPY;  Surgeon: Rexene Alberts, MD;  Location: [facility];  Service: Thoracic;  Laterality: N/A;  . WRIST FRACTURE SURGERY Right ~ 1959    There were no vitals filed for this visit.      Subjective Assessment - 05/27/17 1105    Subjective No new complaints. Has gone up/down 4 stairs at home with spouse assist. Reports it went well, stopped there due to spouse reported fear/nerves.    Patient is accompained by: Family member  spouse   Pertinent History arthritis, bicuspid aortic valve, BPH, carpal tunnel syndrome in R wrist, CAD, diverticulitis, GERD, gout, hyperlipidemia, HTN, DM type II,thrombocytopenia, thoracic ascending aortic aneurysm, severe aortic stenosis, amputation of bilateral hands except right thumb (12/11/2016) due to gangrene, bilateral  transtibial amputations (12/11/2016) due to gangrene, R and L heart cath (10/20/2016)    Limitations Standing;Walking;House hold activities   Patient Stated Goals use prostheses to walk including stairs, outdoors, target shooting, drive   Pain Score 0-No pain              [facility] Adult PT Treatment/Exercise - 05/27/17 1107      Transfers   Transfers Sit to Stand;Stand to Sit   Sit to Stand 4: Min guard;With upper extremity assist;From chair/3-in-1   Sit to Stand Details Verbal cues for sequencing;Verbal cues for technique;Verbal cues for precautions/safety   Stand to Sit 4: Min guard;With upper extremity assist;To chair/3-in-1   Stand to Sit Details (indicate cue type and reason) Verbal cues for sequencing;Verbal cues for technique;Verbal cues for precautions/safety     Ambulation/Gait   Ambulation/Gait Yes    Ambulation/Gait Assistance 5: Supervision   Ambulation/Gait Assistance Details supervision for gait with RW. mod<>max assist for balance with gait using cane.   Ambulation Distance (Feet) 220 Feet  x1   Assistive device Rolling walker;Prostheses   Gait Pattern Step-through pattern;Decreased stride length;Trunk flexed;Wide base of support   Ambulation Surface Level;Indoor   Stairs Yes   Stairs Assistance 4: Min guard   Stairs Assistance Details (indicate cue type and reason) cues needed for sequencing and technique. ascended forwards with single rail and descended sideways with single rail    Stair Management Technique One rail Left;Step to pattern;Sideways;Forwards   Number of Stairs 4   Height of Stairs 6   Ramp 4: Min assist  with RW/prostheses/orthotics   Curb 4: Min assist  with RW/prostheses/orthotics     Prosthetics   Current prosthetic wear tolerance (days/week)  daily    Current prosthetic wear tolerance (#hours/day)  4 hours on, 2 hours off rotation throughout day   Donning Prosthesis Minimal assist   Doffing Prosthesis Minimal assist           PT Short Term Goals - 05/24/17 1158      PT SHORT TERM GOAL #1   Title Patient will demonstrate ability to complete stairs with 1 rail on the L and supervision to indicate a decrease in risk of falling. (TARGET DATE: 06/18/2017)    Time 4   Period Weeks   Status New     PT SHORT TERM GOAL #2   Title Patient will demonstrate ability to ambulate 119ft with cane and min A (hand held assistance) to indicate a decrease in risk of falling. (TARGET DATE: 06/18/2017)    Time 4   Period Weeks   Status New     PT SHORT TERM GOAL #3   Title Patient will demonstrate ability to perform sit <> stand transfer from armless chair with S to indicate improvement in functional mobility and a decrease in risk of falling. (TARGET DATE: 06/18/2017)    Time 4   Period Weeks   Status New     PT SHORT TERM GOAL #4   Title Patient will demonstrate  ability to reach anteriorly by 10 inches and to ankle level towards the ground with unilateral UE support without a loss of balance to indicate a decrease in his risk of falling. (TARGET DATE: 06/18/2017)    Time 4   Period Weeks   Status New           PT Long Term Goals - 04/28/17 1638      PT LONG TERM GOAL #1   Title Patient modified independently donnes & doffes,  adjusts ply socks and pt / wife verbalize understanding of proper prosthetic care to ensure safe use of prostheses.  (Target Date: 08/20/2017)   Time 4   Period Months   Status On-going     PT LONG TERM GOAL #2   Title Patient will tolerate prostheses wear >90% of awake hours without skin issues or limb pain to demonstrate a decrease in his risk of falling. (Target Date: 08/20/2017)   Time 4   Period Months   Status On-going     PT LONG TERM GOAL #3   Title Patient performs standing balance activities with intermittent UE support reaching 10", picking up objects from floor and looks over shoulders with weight shift, trunk rotation with prostheses independently. (Target Date: 08/20/2017)     Time 4   Period Weeks   Status On-going     PT LONG TERM GOAL #4   Title Patient will ambulate 1000 feet including outdoor surfaces with LRAD and prostheses modified independently to enable community mobility.  (Target Date: 08/20/2017)   Time 4   Period Months   Status On-going     PT LONG TERM GOAL #5   Title Patient will negotiate ramp/curbs and stairs with LRAD and prostheses modified independent for community access. (Target Date: 08/20/2017)   Time 4   Period Months   Status On-going     PT LONG TERM GOAL #6   Title Patient's gait velocity will be >/= 1.70ft/s to indicate a limited community ambulator. (Target Date: 08/20/2017)   Time 4   Period Weeks   Status On-going     PT LONG TERM GOAL #7   Title Patient reports Activities of Balance Confindence score using FOTO >25% to indicate greater confidence in his balance.  (Target Date: 08/20/2017)   Time 4   Period Months   Status On-going           Plan - 05/27/17 1106    Clinical Impression Statement Today's skilled session continued to focus on gait/barriers and mobility with prostheses/RW/cane. Pt continues to need increased assistance for balance when not using bil UE support. Pt is progressing toward goals and should benefit from continued PT to progress toward unmet goals.    Rehab Potential Good   Clinical Impairments Affecting Rehab Potential arthritis, bicuspid aortic valve, BPH, carpal tunnel syndrome in R wrist, CAD, diverticulitis, GERD, gout, hyperlipidemia, HTN, DM type II,thrombocytopenia, thoracic ascending aortic aneurysm, severe aortic stenosis, amputation of bilateral hands except right thumb (12/11/2016) due to gangrene, bilateral transtibial amputations (12/11/2016) due to gangrene, R and L heart cath (10/20/2016)    PT Frequency 2x / week   PT Duration Other (comment)  16 weeks   PT Treatment/Interventions ADLs/Self Care Home Management;Neuromuscular re-education;Balance training;Therapeutic exercise;Therapeutic activities;Functional mobility training;Stair training;Gait training;DME Instruction;Patient/family education;Prosthetic Training   PT Next Visit Plan G-code at next session; progress prosthetic gait training including use of cane with quad tip + mod A hand held assist from PT and barriers with RW; progress balance activities including multi direction reaching;    Consulted and Agree with Plan of Care Patient;Family member/caregiver   Family Member Consulted daughter       Patient will benefit from skilled therapeutic intervention in order to improve the following deficits and impairments:  Abnormal gait, Decreased activity tolerance, Decreased balance, Decreased coordination, Decreased range of motion, Decreased mobility, Decreased knowledge of use of DME, Decreased knowledge of precautions, Decreased endurance, Decreased skin  integrity, Decreased scar mobility, Decreased strength, Difficulty walking, Postural dysfunction, Prosthetic  Dependency  Visit Diagnosis: Unsteadiness on feet  Other symptoms and signs involving the musculoskeletal system  Other abnormalities of gait and mobility  Muscle weakness (generalized)     Problem List Patient Active Problem List   Diagnosis Date Noted  . Amputation of both hands with complication, subsequent encounter 03/01/2017  . Diabetes ([place]) 02/19/2017  . Status post bilateral below knee amputation ([place]) 02/09/2017  . Wound disruption, post-op, skin, sequela 02/09/2017  . Debility 02/05/2017  . Ganglion upper arm, right   . Thrombosis of both upper extremities   . Suspected heparin induced thrombocytopenia (HIT) in hospitalized patient ([place])   . Gangrene of lower extremity ([place])   . Heparin induced thrombocytopenia (HIT) ([place])   . Tracheostomy status ([place])   . Chest tube in place   . Atherosclerosis of native arteries of extremities with gangrene, left leg ([place])   . Atherosclerosis of native arteries of extremities with gangrene, right leg ([place])   . Acute on chronic diastolic CHF (congestive heart failure) ([place])   . Enteritis due to Clostridium difficile   . Anasarca   . FUO (fever of unknown origin)   . Acute encephalopathy   . Elevated LFTs   . DIC (disseminated intravascular coagulation) ([place]) 10/28/2016  . Postoperative atrial fibrillation ([place]) 10/24/2016  . Cardiogenic shock ([place])   . Mitral regurgitation due to cusp prolapse 10/22/2016  . S/P aortic valve replacement with bioprosthetic valve 10/22/2016  . S/P ascending aortic aneurysm repair 10/22/2016  . S/P mitral valve repair 10/22/2016  . S/P CABG x 3 10/22/2016  . Thrombocytopenia ([place]) 10/22/2016  . Acute combined systolic and diastolic congestive heart failure ([place]) 10/22/2016  . Acute respiratory failure ([place]) 10/22/2016  . Thoracic ascending aortic aneurysm ([place]) 10/21/2016  . Coronary  artery disease involving native coronary artery of native heart with unstable angina pectoris ([place]) 10/20/2016  . Severe aortic stenosis by prior echocardiography 10/19/2016  . Unstable angina ([place]) 10/19/2016  . Aortic valve stenosis 10/19/2016  . Bicuspid aortic valve 10/19/2016  . Trigger ring finger of right hand   . Wears glasses   . Gout   . Hypertension   . Carpal tunnel syndrome of right wrist   . Arthritis   . Snores     [place], [place], [facility] [address], [place], [place] [address] [phone] 05/27/17, 12:52 PM   Name: Scott [name] MRN: [id number] Date of Birth: 14-Sep-1946

## 2017-05-28 ENCOUNTER — Telehealth (INDEPENDENT_AMBULATORY_CARE_PROVIDER_SITE_OTHER): Payer: Self-pay | Admitting: Orthopedic Surgery

## 2017-05-28 NOTE — Telephone Encounter (Signed)
05/03/2017 OV NOTE FAXED TO [facility]

## 2017-05-31 ENCOUNTER — Ambulatory Visit: Payer: Medicare Other | Admitting: Occupational Therapy

## 2017-05-31 ENCOUNTER — Encounter: Payer: Self-pay | Admitting: Occupational Therapy

## 2017-05-31 ENCOUNTER — Encounter: Payer: Self-pay | Admitting: Physical Therapy

## 2017-05-31 ENCOUNTER — Ambulatory Visit: Payer: Medicare Other | Admitting: Physical Therapy

## 2017-05-31 DIAGNOSIS — G2581 Restless legs syndrome: Secondary | ICD-10-CM | POA: Diagnosis not present

## 2017-05-31 DIAGNOSIS — M6281 Muscle weakness (generalized): Secondary | ICD-10-CM

## 2017-05-31 DIAGNOSIS — E1140 Type 2 diabetes mellitus with diabetic neuropathy, unspecified: Secondary | ICD-10-CM | POA: Diagnosis not present

## 2017-05-31 DIAGNOSIS — E1151 Type 2 diabetes mellitus with diabetic peripheral angiopathy without gangrene: Secondary | ICD-10-CM | POA: Diagnosis not present

## 2017-05-31 DIAGNOSIS — I10 Essential (primary) hypertension: Secondary | ICD-10-CM | POA: Diagnosis not present

## 2017-05-31 DIAGNOSIS — K210 Gastro-esophageal reflux disease with esophagitis: Secondary | ICD-10-CM | POA: Diagnosis not present

## 2017-05-31 DIAGNOSIS — R2681 Unsteadiness on feet: Secondary | ICD-10-CM

## 2017-05-31 DIAGNOSIS — M79642 Pain in left hand: Secondary | ICD-10-CM | POA: Diagnosis not present

## 2017-05-31 DIAGNOSIS — R293 Abnormal posture: Secondary | ICD-10-CM

## 2017-05-31 DIAGNOSIS — M79641 Pain in right hand: Secondary | ICD-10-CM | POA: Diagnosis not present

## 2017-05-31 DIAGNOSIS — R29898 Other symptoms and signs involving the musculoskeletal system: Secondary | ICD-10-CM

## 2017-05-31 DIAGNOSIS — E781 Pure hyperglyceridemia: Secondary | ICD-10-CM | POA: Diagnosis not present

## 2017-05-31 DIAGNOSIS — E7800 Pure hypercholesterolemia, unspecified: Secondary | ICD-10-CM | POA: Diagnosis not present

## 2017-05-31 DIAGNOSIS — R208 Other disturbances of skin sensation: Secondary | ICD-10-CM

## 2017-05-31 DIAGNOSIS — E782 Mixed hyperlipidemia: Secondary | ICD-10-CM | POA: Diagnosis not present

## 2017-05-31 DIAGNOSIS — E1159 Type 2 diabetes mellitus with other circulatory complications: Secondary | ICD-10-CM | POA: Diagnosis not present

## 2017-05-31 DIAGNOSIS — R2689 Other abnormalities of gait and mobility: Secondary | ICD-10-CM

## 2017-05-31 NOTE — Therapy (Signed)
[facility] [address] [place], [place], [address] Phone: [phone]   Fax:  [phone]  Physical Therapy Treatment  Patient Details  Name: Scott [name] MRN: [id number] Date of Birth: [birth year] Referring Provider: Meridee Score MD   Encounter Date: 05/31/2017      PT End of Session - 05/31/17 1321    Visit Number 10   Number of Visits 33   Date for PT Re-Evaluation 06/18/17   Authorization Type Medicare & G-codes every 10th visit    PT Start Time 1317   PT Stop Time 1400   PT Time Calculation (min) 43 min   Equipment Utilized During Treatment Gait belt   Activity Tolerance Patient tolerated treatment well   Behavior During Therapy [facility] for tasks assessed/performed      Past Medical History:  Diagnosis Date  . Arthritis    "hips, shoulders; knees; back" (10/20/2016)  . Bicuspid aortic valve   . BPH (benign prostatic hypertrophy)   . Carpal tunnel syndrome of right wrist   . Coronary artery disease involving native coronary artery of native heart with unstable angina pectoris ([place]) 10/20/2016  . Diverticulitis   . GERD (gastroesophageal reflux disease)   . Gout   . Heart murmur   . Hyperlipemia   . Hypertension   . Postoperative atrial fibrillation ([place]) 10/24/2016  . RLS (restless legs syndrome)   . S/P aortic valve replacement with bioprosthetic valve 10/22/2016   25 mm [facility] Ease bovine pericardial bioprosthetic tissue valve  . S/P ascending aortic aneurysm repair 10/22/2016   28 mm supracoronary straight graft replacement of ascending thoracic aortic aneurysm  . S/P CABG x 3 10/22/2016   Sequential LIMA to Diag and LAD, SVG to distal LAD, open vein harvest right thigh  . S/P mitral valve repair 10/22/2016   Artificial Gore-tex neochord placement x6 - posterior annuloplasty band placed but removed due to systolic anterior motion of mitral valve  . Severe aortic stenosis   . Snores    Never  been tested for sleep apnea  . Thoracic ascending aortic aneurysm ([place]) 10/21/2016  . Thrombocytopenia ([place]) 10/22/2016  . Type II diabetes mellitus ([place])     Past Surgical History:  Procedure Laterality Date  . AMPUTATION Bilateral 12/11/2016   Procedure: AMPUTATION BELOW KNEE BILATERALLY;  Surgeon: [name], MD;  Location: [place];  Service: Orthopedics;  Laterality: Bilateral;  . AMPUTATION Bilateral 12/11/2016   Procedure: AMPUTATION BILATERAL HANDS EXCEPT RIGHT THUMB;  Surgeon: [name], MD;  Location: [place];  Service: Orthopedics;  Laterality: Bilateral;  . AORTIC VALVE REPLACEMENT N/A 10/22/2016   Procedure: AORTIC VALVE REPLACEMENT (AVR) WITH SIZE 25 MM MAGNA EASE PERICARDIAL BIOPROSTHESIS - AORTIC;  Surgeon: Rexene Alberts, MD;  Location: [place];  Service: Open Heart Surgery;  Laterality: N/A;  . BONE EXCISION Right 02/01/2017   Procedure: EXCISION RIGHT INDEX METACARPAL HEAD;  Surgeon: [name], MD;  Location: [place];  Service: Orthopedics;  Laterality: Right;  . CARDIAC CATHETERIZATION N/A 10/20/2016   Procedure: Right/Left Heart Cath and Coronary Angiography;  Surgeon: Leonie Man, MD;  Location: [facility] [facility];  Service: Cardiovascular;  Laterality: N/A;  . CARPAL TUNNEL RELEASE Right 11/28/2013   Procedure: RIGHT WRIST CARPAL TUNNEL RELEASE;  Surgeon: Lorn Junes, MD;  Location: [place];  Service: Orthopedics;  Laterality: Right;  . CATARACT EXTRACTION W/ INTRAOCULAR LENS  IMPLANT, BILATERAL Bilateral 1978  . COLONOSCOPY    .  CORONARY ARTERY BYPASS GRAFT N/A 10/22/2016   Procedure: CORONARY ARTERY BYPASS GRAFTING (CABG)x 2 WITH LIMA TO DIAGONAL, OPEN  HARVESTING OF RIGHT SAPHENOUS VEIN FOR VEIN GRAFT TO LAD;  Surgeon: Rexene Alberts, MD;  Location: [facility];  Service: Open Heart Surgery;  Laterality: N/A;  . FRACTURE SURGERY    . INGUINAL HERNIA REPAIR Right 1998  . IR GENERIC HISTORICAL  12/07/2016   IR US GUIDE VASC ACCESS RIGHT 12/07/2016  Aletta Edouard, MD [facility]  . IR GENERIC HISTORICAL  12/07/2016   IR RADIOLOGY PERIPHERAL GUIDED IV START 12/07/2016 Aletta Edouard, MD [facility]  . IR GENERIC HISTORICAL  12/07/2016   IR GASTROSTOMY TUBE MOD SED 12/07/2016 Aletta Edouard, MD [facility]  . LIPOMA EXCISION Right 2008   "side of my head"  . MITRAL VALVE REPAIR N/A 10/22/2016   Procedure: MITRAL VALVE REPAIR (MVR) WITH SIZE 30 SORIN ANNULOFLEX ANNULOPLASTY RING WITH SUBSEQUENT REMOVAL OF RING;  Surgeon: Rexene Alberts, MD;  Location: [place];  Service: Open Heart Surgery;  Laterality: N/A;  . PENECTOMY  2007   Peyronie's disease   . PENILE PROSTHESIS IMPLANT  2009  . REMOVAL OF PENILE PROSTHESIS N/A 02/01/2017   Procedure: REMOVAL OF PENILE PROSTHESIS;  Surgeon: Kathie Rhodes, MD;  Location: [place];  Service: Urology;  Laterality: N/A;  . SHOULDER OPEN ROTATOR CUFF REPAIR Right 2006  . STERNAL CLOSURE N/A 10/26/2016   Procedure: STERNAL WASHOUT AND DELAYED PRIMARY CLOSURE;  Surgeon: Rexene Alberts, MD;  Location: [place];  Service: Thoracic;  Laterality: N/A;  . TEE WITHOUT CARDIOVERSION N/A 10/26/2016   Procedure: TRANSESOPHAGEAL ECHOCARDIOGRAM (TEE);  Surgeon: Rexene Alberts, MD;  Location: [place];  Service: Thoracic;  Laterality: N/A;  . TEE WITHOUT CARDIOVERSION N/A 10/22/2016   Procedure: TRANSESOPHAGEAL ECHOCARDIOGRAM (TEE);  Surgeon: Rexene Alberts, MD;  Location: [place];  Service: Open Heart Surgery;  Laterality: N/A;  . THORACIC AORTIC ANEURYSM REPAIR  10/22/2016   Procedure: ASCENDING AORTIC  ANEURYSM REPAIR (AAA) WITH 28 MM HEMASHIELD PLATINUM WOVEN DOUBLE VELOUR VASCULAR GRAFT;  Surgeon: Rexene Alberts, MD;  Location: [place];  Service: Open Heart Surgery;;  . TONSILLECTOMY  ~ 1955  . TRACHEOSTOMY TUBE PLACEMENT N/A 11/09/2016   Procedure: TRACHEOSTOMY;  Surgeon: Rexene Alberts, MD;  Location: [facility];  Service: Thoracic;  Laterality: N/A;  . TRANSURETHRAL RESECTION OF PROSTATE  2005  . TRIGGER FINGER RELEASE  Bilateral    several lt and rt hands  . TRIGGER FINGER RELEASE Right 11/28/2013   Procedure: RIGHT TRIGGER FINGER  RELEASE (TENDON SHEATH INCISION);  Surgeon: Lorn Junes, MD;  Location: [place];  Service: Orthopedics;  Laterality: Right;  Mar[place] Kitchen VIDEO BRONCHOSCOPY N/A 11/09/2016   Procedure: VIDEO BRONCHOSCOPY;  Surgeon: Rexene Alberts, MD;  Location: [facility];  Service: Thoracic;  Laterality: N/A;  . WRIST FRACTURE SURGERY Right ~ 1959    There were no vitals filed for this visit.      Subjective Assessment - 05/31/17 1320    Subjective No new compliants. No falls or pain to report. Made it up to 1st landing and down a few times (~7 stairs) with spouse. Was tired afterwards.   Patient is accompained by: Family member   Pertinent History arthritis, bicuspid aortic valve, BPH, carpal tunnel syndrome in R wrist, CAD, diverticulitis, GERD, gout, hyperlipidemia, HTN, DM type II,thrombocytopenia, thoracic ascending aortic aneurysm, severe aortic stenosis, amputation of bilateral hands except right thumb (12/11/2016) due to gangrene, bilateral  transtibial amputations (12/11/2016) due to gangrene, R and L heart cath (10/20/2016)    Limitations Standing;Walking;House hold activities   Patient Stated Goals use prostheses to walk including stairs, outdoors, target shooting, drive   Currently in Pain? No/denies   Pain Score 0-No pain            [facility] Adult PT Treatment/Exercise - 05/31/17 1322      Transfers   Transfers Sit to Stand;Stand to Sit   Sit to Stand 4: Min guard;With upper extremity assist;From chair/3-in-1   Sit to Stand Details Verbal cues for sequencing;Verbal cues for technique;Verbal cues for precautions/safety   Stand to Sit 4: Min guard;With upper extremity assist;To chair/3-in-1   Stand to Sit Details (indicate cue type and reason) Verbal cues for sequencing;Verbal cues for technique;Verbal cues for precautions/safety     Ambulation/Gait   Ambulation/Gait Yes    Ambulation/Gait Assistance 5: Supervision;4: Min guard   Ambulation/Gait Assistance Details cues needed for posture (to look forward, not at feet) and for step placement/base of support with turns/corners as pt tended to cross feet    Ambulation Distance (Feet) 250 Feet   Assistive device Rolling walker;Prostheses     Neuro Re-ed    Neuro Re-ed Details  focused on static standing balance with decreased UE support- standing next to parallel bars with chair behind him for safety: alternating UE raises, progressing to bil UE raises x 10 reps each side; upper trunk rotation with reaching behind him, tracking hand with head x 10 each side; with arms out ot side, upper trunk rotation to bring other hand across to "clap" hands together, then back to open arms out to side. alternated sides x 5 reps to each side. no UE support: EO head movements left<>right and up<>down x 10 reps each with intermittent UE touch to parallel bars. min guard to min assist with all balance activities with cues on posture and ex form/technique.                            Prosthetics   Current prosthetic wear tolerance (days/week)  daily    Current prosthetic wear tolerance (#hours/day)  4 hours on, 2 hours off rotation throughout day   Residual limb condition  right limb: intact except for small open blister area on medial thigh at incision. recovered with band aide at this location. left limb: multiple areas at bottom of limb covered with tegaderm due to drainage. scab over small area at medial incision, dry areas removed today with tweezers with scab still adhered underneath.                              Education Provided Residual limb care;Correct ply sock adjustment;Proper wear schedule/adjustment;Proper weight-bearing schedule/adjustment   Person(s) Educated Patient;Spouse   Education Method Explanation;Verbal cues;Demonstration   Education Method Verbalized understanding;Verbal cues required;Needs further instruction    Donning Prosthesis Minimal assist   Doffing Prosthesis Minimal assist            PT Short Term Goals - 05/24/17 1158      PT SHORT TERM GOAL #1   Title Patient will demonstrate ability to complete stairs with 1 rail on the L and supervision to indicate a decrease in risk of falling. (TARGET DATE: 06/18/2017)    Time 4   Period Weeks   Status New     PT SHORT TERM GOAL #2  Title Patient will demonstrate ability to ambulate 156ft with cane and min A (hand held assistance) to indicate a decrease in risk of falling. (TARGET DATE: 06/18/2017)    Time 4   Period Weeks   Status New     PT SHORT TERM GOAL #3   Title Patient will demonstrate ability to perform sit <> stand transfer from armless chair with S to indicate improvement in functional mobility and a decrease in risk of falling. (TARGET DATE: 06/18/2017)    Time 4   Period Weeks   Status New     PT SHORT TERM GOAL #4   Title Patient will demonstrate ability to reach anteriorly by 10 inches and to ankle level towards the ground with unilateral UE support without a loss of balance to indicate a decrease in his risk of falling. (TARGET DATE: 06/18/2017)    Time 4   Period Weeks   Status New           PT Long Term Goals - 04/28/17 1638      PT LONG TERM GOAL #1   Title Patient modified independently donnes & doffes, adjusts ply socks and pt / wife verbalize understanding of proper prosthetic care to ensure safe use of prostheses.  (Target Date: 08/20/2017)   Time 4   Period Months   Status On-going     PT LONG TERM GOAL #2   Title Patient will tolerate prostheses wear >90% of awake hours without skin issues or limb pain to demonstrate a decrease in his risk of falling. (Target Date: 08/20/2017)   Time 4   Period Months   Status On-going     PT LONG TERM GOAL #3   Title Patient performs standing balance activities with intermittent UE support reaching 10", picking up objects from floor and looks over shoulders with weight  shift, trunk rotation with prostheses independently. (Target Date: 08/20/2017)     Time 4   Period Weeks   Status On-going     PT LONG TERM GOAL #4   Title Patient will ambulate 1000 feet including outdoor surfaces with LRAD and prostheses modified independently to enable community mobility.  (Target Date: 08/20/2017)   Time 4   Period Months   Status On-going     PT LONG TERM GOAL #5   Title Patient will negotiate ramp/curbs and stairs with LRAD and prostheses modified independent for community access. (Target Date: 08/20/2017)   Time 4   Period Months   Status On-going     PT LONG TERM GOAL #6   Title Patient's gait velocity will be >/= 1.64ft/s to indicate a limited community ambulator. (Target Date: 08/20/2017)   Time 4   Period Weeks   Status On-going     PT LONG TERM GOAL #7   Title Patient reports Activities of Balance Confindence score using FOTO >25% to indicate greater confidence in his balance. (Target Date: 08/20/2017)   Time 4   Period Months   Status On-going            Plan - 05/31/17 1322    Clinical Impression Statement Today's skilled session continued to address gait with prostheses/RW with cues needed for step placement, mostly with turns/corners, to prevent feet from crossing over. Remainder of session focused on static standing balance with decreased UE support for balance. Pt is progressing toward goals and should benefit from continued PT to progress toward unmet goals.    Rehab Potential Good   Clinical Impairments Affecting Rehab Potential arthritis,  bicuspid aortic valve, BPH, carpal tunnel syndrome in R wrist, CAD, diverticulitis, GERD, gout, hyperlipidemia, HTN, DM type II,thrombocytopenia, thoracic ascending aortic aneurysm, severe aortic stenosis, amputation of bilateral hands except right thumb (12/11/2016) due to gangrene, bilateral transtibial amputations (12/11/2016) due to gangrene, R and L heart cath (10/20/2016)    PT Frequency 2x / week   PT  Duration Other (comment)  16 weeks   PT Treatment/Interventions ADLs/Self Care Home Management;Neuromuscular re-education;Balance training;Therapeutic exercise;Therapeutic activities;Functional mobility training;Stair training;Gait training;DME Instruction;Patient/family education;Prosthetic Training   PT Next Visit Plan progress prosthetic gait training including use of cane with quad tip + mod A hand held assist from PT and barriers with RW; progress balance activities including multi direction reaching;    Consulted and Agree with Plan of Care Patient;Family member/caregiver   Family Member Consulted daughter       Patient will benefit from skilled therapeutic intervention in order to improve the following deficits and impairments:  Abnormal gait, Decreased activity tolerance, Decreased balance, Decreased coordination, Decreased range of motion, Decreased mobility, Decreased knowledge of use of DME, Decreased knowledge of precautions, Decreased endurance, Decreased skin integrity, Decreased scar mobility, Decreased strength, Difficulty walking, Postural dysfunction, Prosthetic Dependency  Visit Diagnosis: Unsteadiness on feet  Other abnormalities of gait and mobility  Muscle weakness (generalized)       G-Codes - 06/26/17 1437    Functional Assessment Tool Used (Outpatient Only) pt is wearing prostheses 4 hours on, 2 hours off rotation with wounds healing. mostly supervision to don/doff, at times min assist needed. wearing them daily.    Functional Limitation Self care      Problem List Patient Active Problem List   Diagnosis Date Noted  . Amputation of both hands with complication, subsequent encounter 03/01/2017  . Diabetes ([place]) 02/19/2017  . Status post bilateral below knee amputation ([place]) 02/09/2017  . Wound disruption, post-op, skin, sequela 02/09/2017  . Debility 02/05/2017  . Ganglion upper arm, right   . Thrombosis of both upper extremities   . Suspected heparin  induced thrombocytopenia (HIT) in hospitalized patient ([place])   . Gangrene of lower extremity ([place])   . Heparin induced thrombocytopenia (HIT) ([address])   . Tracheostomy status ([place])   . Chest tube in place   . Atherosclerosis of native arteries of extremities with gangrene, left leg ([place])   . Atherosclerosis of native arteries of extremities with gangrene, right leg ([place])   . Acute on chronic diastolic CHF (congestive heart failure) ([place])   . Enteritis due to Clostridium difficile   . Anasarca   . FUO (fever of unknown origin)   . Acute encephalopathy   . Elevated LFTs   . DIC (disseminated intravascular coagulation) ([place]) 10/28/2016  . Postoperative atrial fibrillation ([place]) 10/24/2016  . Cardiogenic shock ([place])   . Mitral regurgitation due to cusp prolapse 10/22/2016  . S/P aortic valve replacement with bioprosthetic valve 10/22/2016  . S/P ascending aortic aneurysm repair 10/22/2016  . S/P mitral valve repair 10/22/2016  . S/P CABG x 3 10/22/2016  . Thrombocytopenia ([place]) 10/22/2016  . Acute combined systolic and diastolic congestive heart failure ([place]) 10/22/2016  . Acute respiratory failure ([place]) 10/22/2016  . Thoracic ascending aortic aneurysm ([place]) 10/21/2016  . Coronary artery disease involving native coronary artery of native heart with unstable angina pectoris ([place]) 10/20/2016  . Severe aortic stenosis by prior echocardiography 10/19/2016  . Unstable angina ([place]) 10/19/2016  . Aortic valve stenosis 10/19/2016  . Bicuspid aortic valve 10/19/2016  . Trigger ring finger of  right hand   . Wears glasses   . Gout   . Hypertension   . Carpal tunnel syndrome of right wrist   . Arthritis   . Snores     [place], [place], [facility] [address], [place], [place] [address] [phone] 11-Jun-2017, 4:07 PM   Name: Scott [name] MRN: [id number] Date of Birth: 01/08/1946      G-Codes - June 11, 2017 1437    Functional Assessment Tool Used  (Outpatient Only) pt is wearing prostheses 4 hours on, 2 hours off rotation with wounds healing. mostly supervision to don/doff, at times min assist needed. wearing them daily.    Functional Limitation Self care   Self Care Current Status [phone]) At least 40 percent but less than 60 percent impaired, limited or restricted   Self Care Goal Status (D9833) At least 1 percent but less than 20 percent impaired, limited or restricted     Physical Therapy Progress Note  Dates of Reporting Period: 04/20/2017 to 2017-06-11  Objective Reports of Subjective Statement: Patient reports wearing & using prostheses more at home.   Objective Measurements: see above  Goal Update: see above  Plan: continue established plan  Reason Skilled Services are Required: Patient and family require skilled care for prosthetic instruction & function to improve gait & balance.   Jamey Reas, PT, DPT PT Specializing in [place] Jun 11, 2017 4:38 PM Phone:  [phone]  Fax:  [phone] [facility] [address] [place], [place] [address]

## 2017-05-31 NOTE — Therapy (Signed)
[facility] [address] [place], [place], [address] Phone: [phone]   Fax:  [phone]  Occupational Therapy Treatment  Patient Details  Name: Scott [name] MRN: [id number] Date of Birth: [birth year] Referring Provider: Dr. Meridee Score  Encounter Date: 05/31/2017      OT End of Session - 05/31/17 1526    Visit Number 3   Number of Visits 16   Date for OT Re-Evaluation 07/15/17   Authorization Type medicare pt will need G code and PN every 10th visit   Authorization Time Period 60 days   Authorization - Visit Number 3   Authorization - Number of Visits 10   OT Start Time 1400   OT Stop Time 1448   OT Time Calculation (min) 48 min   Activity Tolerance Patient tolerated treatment well   Behavior During Therapy [facility] for tasks assessed/performed      Past Medical History:  Diagnosis Date  . Arthritis    "hips, shoulders; knees; back" (10/20/2016)  . Bicuspid aortic valve   . BPH (benign prostatic hypertrophy)   . Carpal tunnel syndrome of right wrist   . Coronary artery disease involving native coronary artery of native heart with unstable angina pectoris ([place]) 10/20/2016  . Diverticulitis   . GERD (gastroesophageal reflux disease)   . Gout   . Heart murmur   . Hyperlipemia   . Hypertension   . Postoperative atrial fibrillation ([place]) 10/24/2016  . RLS (restless legs syndrome)   . S/P aortic valve replacement with bioprosthetic valve 10/22/2016   25 mm [facility] Ease bovine pericardial bioprosthetic tissue valve  . S/P ascending aortic aneurysm repair 10/22/2016   28 mm supracoronary straight graft replacement of ascending thoracic aortic aneurysm  . S/P CABG x 3 10/22/2016   Sequential LIMA to Diag and LAD, SVG to distal LAD, open vein harvest right thigh  . S/P mitral valve repair 10/22/2016   Artificial Gore-tex neochord placement x6 - posterior annuloplasty band placed but removed due to systolic  anterior motion of mitral valve  . Severe aortic stenosis   . Snores    Never been tested for sleep apnea  . Thoracic ascending aortic aneurysm ([place]) 10/21/2016  . Thrombocytopenia ([place]) 10/22/2016  . Type II diabetes mellitus ([place])     Past Surgical History:  Procedure Laterality Date  . AMPUTATION Bilateral 12/11/2016   Procedure: AMPUTATION BELOW KNEE BILATERALLY;  Surgeon: [name], MD;  Location: [facility];  Service: Orthopedics;  Laterality: Bilateral;  . AMPUTATION Bilateral 12/11/2016   Procedure: AMPUTATION BILATERAL HANDS EXCEPT RIGHT THUMB;  Surgeon: [name], MD;  Location: [facility];  Service: Orthopedics;  Laterality: Bilateral;  . AORTIC VALVE REPLACEMENT N/A 10/22/2016   Procedure: AORTIC VALVE REPLACEMENT (AVR) WITH SIZE 25 MM MAGNA EASE PERICARDIAL BIOPROSTHESIS - AORTIC;  Surgeon: Rexene Alberts, MD;  Location: [facility];  Service: Open Heart Surgery;  Laterality: N/A;  . BONE EXCISION Right 02/01/2017   Procedure: EXCISION RIGHT INDEX METACARPAL HEAD;  Surgeon: [name], MD;  Location: [facility];  Service: Orthopedics;  Laterality: Right;  . CARDIAC CATHETERIZATION N/A 10/20/2016   Procedure: Right/Left Heart Cath and Coronary Angiography;  Surgeon: Leonie Man, MD;  Location: [facility];  Service: Cardiovascular;  Laterality: N/A;  . CARPAL TUNNEL RELEASE Right 11/28/2013   Procedure: RIGHT WRIST CARPAL TUNNEL RELEASE;  Surgeon: Lorn Junes, MD;  Location: [facility];  Service: Orthopedics;  Laterality: Right;  .  CATARACT EXTRACTION W/ INTRAOCULAR LENS  IMPLANT, BILATERAL Bilateral 1978  . COLONOSCOPY    . CORONARY ARTERY BYPASS GRAFT N/A 10/22/2016   Procedure: CORONARY ARTERY BYPASS GRAFTING (CABG)x 2 WITH LIMA TO DIAGONAL, OPEN  HARVESTING OF RIGHT SAPHENOUS VEIN FOR VEIN GRAFT TO LAD;  Surgeon: Rexene Alberts, MD;  Location: [facility];  Service: Open Heart Surgery;  Laterality: N/A;  . FRACTURE SURGERY    . INGUINAL HERNIA REPAIR Right 1998   . IR GENERIC HISTORICAL  12/07/2016   IR US GUIDE VASC ACCESS RIGHT 12/07/2016 Aletta Edouard, MD [facility]  . IR GENERIC HISTORICAL  12/07/2016   IR RADIOLOGY PERIPHERAL GUIDED IV START 12/07/2016 Aletta Edouard, MD [facility]  . IR GENERIC HISTORICAL  12/07/2016   IR GASTROSTOMY TUBE MOD SED 12/07/2016 Aletta Edouard, MD [facility]  . LIPOMA EXCISION Right 2008   "side of my head"  . MITRAL VALVE REPAIR N/A 10/22/2016   Procedure: MITRAL VALVE REPAIR (MVR) WITH SIZE 30 SORIN ANNULOFLEX ANNULOPLASTY RING WITH SUBSEQUENT REMOVAL OF RING;  Surgeon: Rexene Alberts, MD;  Location: [facility];  Service: Open Heart Surgery;  Laterality: N/A;  . PENECTOMY  2007   Peyronie's disease   . PENILE PROSTHESIS IMPLANT  2009  . REMOVAL OF PENILE PROSTHESIS N/A 02/01/2017   Procedure: REMOVAL OF PENILE PROSTHESIS;  Surgeon: Kathie Rhodes, MD;  Location: [facility];  Service: Urology;  Laterality: N/A;  . SHOULDER OPEN ROTATOR CUFF REPAIR Right 2006  . STERNAL CLOSURE N/A 10/26/2016   Procedure: STERNAL WASHOUT AND DELAYED PRIMARY CLOSURE;  Surgeon: Rexene Alberts, MD;  Location: [facility];  Service: Thoracic;  Laterality: N/A;  . TEE WITHOUT CARDIOVERSION N/A 10/26/2016   Procedure: TRANSESOPHAGEAL ECHOCARDIOGRAM (TEE);  Surgeon: Rexene Alberts, MD;  Location: [facility];  Service: Thoracic;  Laterality: N/A;  . TEE WITHOUT CARDIOVERSION N/A 10/22/2016   Procedure: TRANSESOPHAGEAL ECHOCARDIOGRAM (TEE);  Surgeon: Rexene Alberts, MD;  Location: [facility];  Service: Open Heart Surgery;  Laterality: N/A;  . THORACIC AORTIC ANEURYSM REPAIR  10/22/2016   Procedure: ASCENDING AORTIC  ANEURYSM REPAIR (AAA) WITH 28 MM HEMASHIELD PLATINUM WOVEN DOUBLE VELOUR VASCULAR GRAFT;  Surgeon: Rexene Alberts, MD;  Location: [facility];  Service: Open Heart Surgery;;  . TONSILLECTOMY  ~ 1955  . TRACHEOSTOMY TUBE PLACEMENT N/A 11/09/2016   Procedure: TRACHEOSTOMY;  Surgeon: Rexene Alberts, MD;  Location: [facility];  Service: Thoracic;  Laterality:  N/A;  . TRANSURETHRAL RESECTION OF PROSTATE  2005  . TRIGGER FINGER RELEASE Bilateral    several lt and rt hands  . TRIGGER FINGER RELEASE Right 11/28/2013   Procedure: RIGHT TRIGGER FINGER  RELEASE (TENDON SHEATH INCISION);  Surgeon: Lorn Junes, MD;  Location: [facility];  Service: Orthopedics;  Laterality: Right;  Marland Kitchen VIDEO BRONCHOSCOPY N/A 11/09/2016   Procedure: VIDEO BRONCHOSCOPY;  Surgeon: Rexene Alberts, MD;  Location: [facility];  Service: Thoracic;  Laterality: N/A;  . WRIST FRACTURE SURGERY Right ~ 1959    There were no vitals filed for this visit.      Subjective Assessment - 05/31/17 1506    Subjective  Patient purchased a toliet aid, but was unable to have it release toilet paper, still exploring options.     Patient is accompained by: Family member   Pertinent History see epic. Pt with Bil BKA as well as bilateral UE amputations of all fingers excepet L thumb at MCP joint due to gangrene.    Patient Stated  Goals walk, drive a car and go where I want to go. Wife hopes pt can go out to eat in a restaurant and use public bathroom and eat .    Currently in Pain? No/denies   Pain Score 0-No pain                      OT Treatments/Exercises (OP) - 05/31/17 1508      ADLs   Toileting Problem solved through various toilet aides available.  Patient is now able to clean partially after toileting, but unable to release paper into toilet effectively    Bathing Patient would like to shower, but first has to be able to climb 14 stairs.  7 steps, landing, and 7 steps - with rail on one side.  Patient has attempted climbing forst seven steps recently.  Discussed with patient and wife, process for showering.  Patient has padded tub bench with cut out that he has used for toileting, but has not used for showering.  Discussed logisitics of shower transfer, having towels, clothes nearby, steps of transfer, management of shower curtain, walking to shower bench versus  use of wheelchair, etc.  Patient and wife actively involved in these discussions, and we will practice an actual transfer at next session.     Writing Addressed handwriting using adapted cuff for writing, with strap added to increase stability on right hand.  Patient also attempted to write with his universal cuff with metal post, and built up grip on pencil, but with only two points of contact, patient wasn't able to stabilize pen sufficiently.  With third point of contact, patient able to write - but felt adaptive writing cuff was more stable.                 OT Education - 05/31/17 1525    Education provided Yes   Education Details tub transfers, handwriting, toilet aides   Person(s) Educated Patient;Spouse   Methods Explanation   Comprehension Verbalized understanding          OT Short Term Goals - 05/25/17 1659      OT SHORT TERM GOAL #1   Title PT and wife will be mod I with home activities program - 06/14/2017   Status On-going     OT SHORT TERM GOAL #2   Title Pt will be supervision for 3 in 1 commode transfers   Status On-going     OT [place] #3   Title Pt will demonstrate ability to use opposition splint with prn with basic ADL tasks   Status On-going     OT SHORT TERM GOAL #4   Title Pt will be able to complete simple cold snack prep with min a   Status On-going           OT Long Term Goals - 05/31/17 1529      OT LONG TERM GOAL #1   Title Pt will be mod I with home activities program - 07/12/2017   Status On-going     OT LONG TERM GOAL #2   Title Pt will be mod I with toilet transfers   Status On-going     OT LONG TERM GOAL #3   Title IF pt is able to complete stairs, pt will be mod I with tub bench transfers   Status On-going     OT LONG TERM GOAL #4   Title Pt will verbalize understanding of driving eval information   Status On-going  OT LONG TERM GOAL #5   Title Pt will be mod I with toilet hygiene with AE   Status On-going      OT LONG TERM GOAL #6   Title Pt will be mod I with buttoning, zipping using AE/prostheses   Status On-going     OT LONG TERM GOAL #7   Title Pt will be mod I with cutting food with AE/prostheses   Status On-going     OT LONG TERM GOAL #8   Title Pt will be able to write at 3 sentence level with 100% legibility with AE.    Status On-going               Plan - 05/31/17 1526    Clinical Impression Statement Patient is very eager for increased independence with ADL/IADL.  Patient's wife very supportive.     Rehab Potential Fair   OT Frequency 2x / week   OT Duration 8 weeks   OT Treatment/Interventions Self-care/ADL training;Moist Heat;Cryotherapy;Ultrasound;Fluidtherapy;DME and/or AE instruction;Neuromuscular education;Therapeutic exercise;Therapist, nutritional;Therapeutic activities;Splinting;Patient/family education;Balance Museum/gallery conservator transfer, address toilet aide,    Consulted and Agree with Plan of Care Patient;Family member/caregiver   Family Member Consulted wife      Patient will benefit from skilled therapeutic intervention in order to improve the following deficits and impairments:  Abnormal gait, Decreased activity tolerance, Decreased balance, Decreased mobility, Decreased knowledge of use of DME, Difficulty walking, Impaired UE functional use, Impaired sensation, Pain, Other (comment)  Visit Diagnosis: Unsteadiness on feet  Muscle weakness (generalized)  Other symptoms and signs involving the musculoskeletal system  Other disturbances of skin sensation  Pain in right hand  Pain in left hand  Abnormal posture    Problem List Patient Active Problem List   Diagnosis Date Noted  . Amputation of both hands with complication, subsequent encounter 03/01/2017  . Diabetes ([place]) 02/19/2017  . Status post bilateral below knee amputation ([place]) 02/09/2017  . Wound disruption, post-op, skin, sequela 02/09/2017  . Debility 02/05/2017  .  Ganglion upper arm, right   . Thrombosis of both upper extremities   . Suspected heparin induced thrombocytopenia (HIT) in hospitalized patient ([place])   . Gangrene of lower extremity ([place])   . Heparin induced thrombocytopenia (HIT) ([place])   . Tracheostomy status ([place])   . Chest tube in place   . Atherosclerosis of native arteries of extremities with gangrene, left leg ([place])   . Atherosclerosis of native arteries of extremities with gangrene, right leg ([place])   . Acute on chronic diastolic CHF (congestive heart failure) ([place])   . Enteritis due to Clostridium difficile   . Anasarca   . FUO (fever of unknown origin)   . Acute encephalopathy   . Elevated LFTs   . DIC (disseminated intravascular coagulation) ([place]) 10/28/2016  . Postoperative atrial fibrillation ([place]) 10/24/2016  . Cardiogenic shock ([place])   . Mitral regurgitation due to cusp prolapse 10/22/2016  . S/P aortic valve replacement with bioprosthetic valve 10/22/2016  . S/P ascending aortic aneurysm repair 10/22/2016  . S/P mitral valve repair 10/22/2016  . S/P CABG x 3 10/22/2016  . Thrombocytopenia ([place]) 10/22/2016  . Acute combined systolic and diastolic congestive heart failure ([place]) 10/22/2016  . Acute respiratory failure ([place]) 10/22/2016  . Thoracic ascending aortic aneurysm ([place]) 10/21/2016  . Coronary artery disease involving native coronary artery of native heart with unstable angina pectoris ([place]) 10/20/2016  . Severe aortic stenosis by prior echocardiography 10/19/2016  . Unstable angina ([place]) 10/19/2016  .  Aortic valve stenosis 10/19/2016  . Bicuspid aortic valve 10/19/2016  . Trigger ring finger of right hand   . Wears glasses   . Gout   . Hypertension   . Carpal tunnel syndrome of right wrist   . Arthritis   . Snores     Mariah Milling 05/31/2017, 3:30 PM  [facility] [address] [place], [place], [address] Phone: [phone]   Fax:   [phone]  Name: DIETER HANE MRN: [id number] Date of Birth: May 22, 1946

## 2017-06-02 ENCOUNTER — Encounter: Payer: Medicare Other | Admitting: Physical Therapy

## 2017-06-02 DIAGNOSIS — E1140 Type 2 diabetes mellitus with diabetic neuropathy, unspecified: Secondary | ICD-10-CM | POA: Diagnosis not present

## 2017-06-02 DIAGNOSIS — I10 Essential (primary) hypertension: Secondary | ICD-10-CM | POA: Diagnosis not present

## 2017-06-02 DIAGNOSIS — Z1389 Encounter for screening for other disorder: Secondary | ICD-10-CM | POA: Diagnosis not present

## 2017-06-02 DIAGNOSIS — E782 Mixed hyperlipidemia: Secondary | ICD-10-CM | POA: Diagnosis not present

## 2017-06-02 DIAGNOSIS — N401 Enlarged prostate with lower urinary tract symptoms: Secondary | ICD-10-CM | POA: Diagnosis not present

## 2017-06-02 DIAGNOSIS — Z0001 Encounter for general adult medical examination with abnormal findings: Secondary | ICD-10-CM | POA: Diagnosis not present

## 2017-06-02 DIAGNOSIS — Z0000 Encounter for general adult medical examination without abnormal findings: Secondary | ICD-10-CM | POA: Diagnosis not present

## 2017-06-02 DIAGNOSIS — M1A9XX0 Chronic gout, unspecified, without tophus (tophi): Secondary | ICD-10-CM | POA: Diagnosis not present

## 2017-06-02 DIAGNOSIS — Z6834 Body mass index (BMI) 34.0-34.9, adult: Secondary | ICD-10-CM | POA: Diagnosis not present

## 2017-06-02 DIAGNOSIS — E119 Type 2 diabetes mellitus without complications: Secondary | ICD-10-CM | POA: Diagnosis not present

## 2017-06-04 ENCOUNTER — Encounter: Payer: Self-pay | Admitting: Physical Therapy

## 2017-06-04 ENCOUNTER — Encounter: Payer: Medicare Other | Admitting: Occupational Therapy

## 2017-06-04 ENCOUNTER — Ambulatory Visit: Payer: Medicare Other | Admitting: Physical Therapy

## 2017-06-04 ENCOUNTER — Ambulatory Visit: Payer: Medicare Other | Admitting: Occupational Therapy

## 2017-06-04 ENCOUNTER — Encounter: Payer: Self-pay | Admitting: Occupational Therapy

## 2017-06-04 DIAGNOSIS — M79641 Pain in right hand: Secondary | ICD-10-CM | POA: Diagnosis not present

## 2017-06-04 DIAGNOSIS — M79642 Pain in left hand: Secondary | ICD-10-CM | POA: Diagnosis not present

## 2017-06-04 DIAGNOSIS — R29898 Other symptoms and signs involving the musculoskeletal system: Secondary | ICD-10-CM

## 2017-06-04 DIAGNOSIS — R2681 Unsteadiness on feet: Secondary | ICD-10-CM | POA: Diagnosis not present

## 2017-06-04 DIAGNOSIS — M6281 Muscle weakness (generalized): Secondary | ICD-10-CM

## 2017-06-04 DIAGNOSIS — R2689 Other abnormalities of gait and mobility: Secondary | ICD-10-CM | POA: Diagnosis not present

## 2017-06-04 DIAGNOSIS — R208 Other disturbances of skin sensation: Secondary | ICD-10-CM

## 2017-06-04 NOTE — Addendum Note (Signed)
Addendum  created 06/04/17 0932 by Nolon Nations, MD   Sign clinical note

## 2017-06-04 NOTE — Therapy (Signed)
[facility] [address] [place], [place], [address] Phone: [phone]   Fax:  [phone]  Occupational Therapy Treatment  Patient Details  Name: Scott [name] MRN: [id number] Date of Birth: May 06, 1946 Referring Provider: Dr. Meridee Score  Encounter Date: 06/04/2017      OT End of Session - 06/04/17 1251    Visit Number 4   Number of Visits 16   Date for OT Re-Evaluation 07/15/17   Authorization Type medicare pt will need G code and PN every 10th visit   Authorization Time Period 60 days   Authorization - Visit Number 4   Authorization - Number of Visits 10   OT Start Time 4401   OT Stop Time 1231   OT Time Calculation (min) 46 min   Activity Tolerance Patient tolerated treatment well   Behavior During Therapy [facility] for tasks assessed/performed      Past Medical History:  Diagnosis Date  . Arthritis    "hips, shoulders; knees; back" (10/20/2016)  . Bicuspid aortic valve   . BPH (benign prostatic hypertrophy)   . Carpal tunnel syndrome of right wrist   . Coronary artery disease involving native coronary artery of native heart with unstable angina pectoris ([place]) 10/20/2016  . Diverticulitis   . GERD (gastroesophageal reflux disease)   . Gout   . Heart murmur   . Hyperlipemia   . Hypertension   . Postoperative atrial fibrillation ([place]) 10/24/2016  . RLS (restless legs syndrome)   . S/P aortic valve replacement with bioprosthetic valve 10/22/2016   25 mm [facility] Ease bovine pericardial bioprosthetic tissue valve  . S/P ascending aortic aneurysm repair 10/22/2016   28 mm supracoronary straight graft replacement of ascending thoracic aortic aneurysm  . S/P CABG x 3 10/22/2016   Sequential LIMA to Diag and LAD, SVG to distal LAD, open vein harvest right thigh  . S/P mitral valve repair 10/22/2016   Artificial Gore-tex neochord placement x6 - posterior annuloplasty band placed but removed due to systolic  anterior motion of mitral valve  . Severe aortic stenosis   . Snores    Never been tested for sleep apnea  . Thoracic ascending aortic aneurysm ([place]) 10/21/2016  . Thrombocytopenia ([place]) 10/22/2016  . Type II diabetes mellitus ([place])     Past Surgical History:  Procedure Laterality Date  . AMPUTATION Bilateral 12/11/2016   Procedure: AMPUTATION BELOW KNEE BILATERALLY;  Surgeon: [name], MD;  Location: [facility];  Service: Orthopedics;  Laterality: Bilateral;  . AMPUTATION Bilateral 12/11/2016   Procedure: AMPUTATION BILATERAL HANDS EXCEPT RIGHT THUMB;  Surgeon: [name], MD;  Location: [facility];  Service: Orthopedics;  Laterality: Bilateral;  . AORTIC VALVE REPLACEMENT N/A 10/22/2016   Procedure: AORTIC VALVE REPLACEMENT (AVR) WITH SIZE 25 MM MAGNA EASE PERICARDIAL BIOPROSTHESIS - AORTIC;  Surgeon: Rexene Alberts, MD;  Location: [facility];  Service: Open Heart Surgery;  Laterality: N/A;  . BONE EXCISION Right 02/01/2017   Procedure: EXCISION RIGHT INDEX METACARPAL HEAD;  Surgeon: [name], MD;  Location: [facility];  Service: Orthopedics;  Laterality: Right;  . CARDIAC CATHETERIZATION N/A 10/20/2016   Procedure: Right/Left Heart Cath and Coronary Angiography;  Surgeon: Leonie Man, MD;  Location: [facility] CV LAB;  Service: Cardiovascular;  Laterality: N/A;  . CARPAL TUNNEL RELEASE Right 11/28/2013   Procedure: RIGHT WRIST CARPAL TUNNEL RELEASE;  Surgeon: Lorn Junes, MD;  Location: [facility];  Service: Orthopedics;  Laterality: Right;  .  CATARACT EXTRACTION W/ INTRAOCULAR LENS  IMPLANT, BILATERAL Bilateral 1978  . COLONOSCOPY    . CORONARY ARTERY BYPASS GRAFT N/A 10/22/2016   Procedure: CORONARY ARTERY BYPASS GRAFTING (CABG)x 2 WITH LIMA TO DIAGONAL, OPEN  HARVESTING OF RIGHT SAPHENOUS VEIN FOR VEIN GRAFT TO LAD;  Surgeon: Rexene Alberts, MD;  Location: [facility];  Service: Open Heart Surgery;  Laterality: N/A;  . FRACTURE SURGERY    . INGUINAL HERNIA REPAIR Right 1998   . IR GENERIC HISTORICAL  12/07/2016   IR US GUIDE VASC ACCESS RIGHT 12/07/2016 Aletta Edouard, MD [facility]  . IR GENERIC HISTORICAL  12/07/2016   IR RADIOLOGY PERIPHERAL GUIDED IV START 12/07/2016 Aletta Edouard, MD [facility]  . IR GENERIC HISTORICAL  12/07/2016   IR GASTROSTOMY TUBE MOD SED 12/07/2016 Aletta Edouard, MD [facility]  . LIPOMA EXCISION Right 2008   "side of my head"  . MITRAL VALVE REPAIR N/A 10/22/2016   Procedure: MITRAL VALVE REPAIR (MVR) WITH SIZE 30 SORIN ANNULOFLEX ANNULOPLASTY RING WITH SUBSEQUENT REMOVAL OF RING;  Surgeon: Rexene Alberts, MD;  Location: [facility];  Service: Open Heart Surgery;  Laterality: N/A;  . PENECTOMY  2007   Peyronie's disease   . PENILE PROSTHESIS IMPLANT  2009  . REMOVAL OF PENILE PROSTHESIS N/A 02/01/2017   Procedure: REMOVAL OF PENILE PROSTHESIS;  Surgeon: Kathie Rhodes, MD;  Location: [facility];  Service: Urology;  Laterality: N/A;  . SHOULDER OPEN ROTATOR CUFF REPAIR Right 2006  . STERNAL CLOSURE N/A 10/26/2016   Procedure: STERNAL WASHOUT AND DELAYED PRIMARY CLOSURE;  Surgeon: Rexene Alberts, MD;  Location: [facility];  Service: Thoracic;  Laterality: N/A;  . TEE WITHOUT CARDIOVERSION N/A 10/26/2016   Procedure: TRANSESOPHAGEAL ECHOCARDIOGRAM (TEE);  Surgeon: Rexene Alberts, MD;  Location: [facility];  Service: Thoracic;  Laterality: N/A;  . TEE WITHOUT CARDIOVERSION N/A 10/22/2016   Procedure: TRANSESOPHAGEAL ECHOCARDIOGRAM (TEE);  Surgeon: Rexene Alberts, MD;  Location: [facility];  Service: Open Heart Surgery;  Laterality: N/A;  . THORACIC AORTIC ANEURYSM REPAIR  10/22/2016   Procedure: ASCENDING AORTIC  ANEURYSM REPAIR (AAA) WITH 28 MM HEMASHIELD PLATINUM WOVEN DOUBLE VELOUR VASCULAR GRAFT;  Surgeon: Rexene Alberts, MD;  Location: [facility];  Service: Open Heart Surgery;;  . TONSILLECTOMY  ~ 1955  . TRACHEOSTOMY TUBE PLACEMENT N/A 11/09/2016   Procedure: TRACHEOSTOMY;  Surgeon: Rexene Alberts, MD;  Location: [facility];  Service: Thoracic;  Laterality:  N/A;  . TRANSURETHRAL RESECTION OF PROSTATE  2005  . TRIGGER FINGER RELEASE Bilateral    several lt and rt hands  . TRIGGER FINGER RELEASE Right 11/28/2013   Procedure: RIGHT TRIGGER FINGER  RELEASE (TENDON SHEATH INCISION);  Surgeon: Lorn Junes, MD;  Location: [facility];  Service: Orthopedics;  Laterality: Right;  Marland Kitchen VIDEO BRONCHOSCOPY N/A 11/09/2016   Procedure: VIDEO BRONCHOSCOPY;  Surgeon: Rexene Alberts, MD;  Location: [facility];  Service: Thoracic;  Laterality: N/A;  . WRIST FRACTURE SURGERY Right ~ 1959    There were no vitals filed for this visit.                    OT Treatments/Exercises (OP) - 06/04/17 1242      ADLs   Toileting Found patient a toilet aide that he was able to fill and empty effectively in the clinic setting.  Did not actual use for toilet hygiene, but patient plans to attempt over the weekend.     Bathing Practiced tub  bench transfers with padded tub seat with cut out, as patient has at home.  Patient able to transfer to from bench with min assist.  Patient able to doff/don prosthesis at edge of seat, and scoot in /out along bench without difficulty.  Patient able to weight shift sufficiently to wash bottom.  Patient was issued a wash mitt on rehab.  He hasn't used it since returning home.  Encouraged patient to utilize wash mitt for increased independence with bathing - with overall goal of modified independence for shower.  Discussed options for shelving in tub (suction cup shelving, or corner shelving to hold patient's soap, shampoo, etc.  Discussed the importance of having all needed supplies nearby with shower - patient hopes to use a table or small shelving unit outside the tub to hold towel, clothing, hygiene items, etc.     Writing Practiced writing with various implements.  Patient has been using adaptive writing cuff since last visit, and finds this is helpful, although he feels he needs more practice.  Today introduced the  writing bird, which patient was able to use effectively.  Patient took both items for additional practice to determine best option for him.                  OT Education - 06/04/17 1250    Education provided Yes   Education Details toilet aide, tub transfers - logistics, writing aides   Person(s) Educated Patient   Methods Explanation;Demonstration;Tactile cues;Verbal cues   Comprehension Verbalized understanding;Returned demonstration          OT Short Term Goals - 06/04/17 1255      OT SHORT TERM GOAL #1   Title PT and wife will be mod I with home activities program - 06/14/2017   Status On-going     OT SHORT TERM GOAL #2   Title Pt will be supervision for 3 in 1 commode transfers   Status Achieved     OT SHORT TERM GOAL #3   Title Pt will demonstrate ability to use opposition splint with prn with basic ADL tasks   Status On-going     OT SHORT TERM GOAL #4   Title Pt will be able to complete simple cold snack prep with min a   Status On-going           OT Long Term Goals - 05/31/17 1529      OT LONG TERM GOAL #1   Title Pt will be mod I with home activities program - 07/12/2017   Status On-going     OT LONG TERM GOAL #2   Title Pt will be mod I with toilet transfers   Status On-going     OT LONG TERM GOAL #3   Title IF pt is able to complete stairs, pt will be mod I with tub bench transfers   Status On-going     OT LONG TERM GOAL #4   Title Pt will verbalize understanding of driving eval information   Status On-going     OT LONG TERM GOAL #5   Title Pt will be mod I with toilet hygiene with AE   Status On-going     OT LONG TERM GOAL #6   Title Pt will be mod I with buttoning, zipping using AE/prostheses   Status On-going     OT LONG TERM GOAL #7   Title Pt will be mod I with cutting food with AE/prostheses   Status On-going     OT  LONG TERM GOAL #8   Title Pt will be able to write at 3 sentence level with 100% legibility with AE.    Status  On-going               Plan - 06/04/17 1251    Clinical Impression Statement Patient is progressing well toward OT goals due to improved functional mobility, strength, endurance, and excellent problem solving skills.     Rehab Potential Fair   OT Frequency 2x / week   OT Duration 8 weeks   OT Treatment/Interventions Self-care/ADL training;Moist Heat;Cryotherapy;Ultrasound;Fluidtherapy;DME and/or AE instruction;Neuromuscular education;Therapeutic exercise;Therapist, nutritional;Therapeutic activities;Splinting;Patient/family education;Balance training   Plan He should be getting prosthesis for LUE week of 7/16 Remo Lipps may attend therapy sessions for instruction on device.  If prosthesis availabale - may be able to  address grasp release left -    Consulted and Agree with Plan of Care Patient;Family member/caregiver   Family Member Consulted daughter [name]      Patient will benefit from skilled therapeutic intervention in order to improve the following deficits and impairments:  Abnormal gait, Decreased activity tolerance, Decreased balance, Decreased mobility, Decreased knowledge of use of DME, Difficulty walking, Impaired UE functional use, Impaired sensation, Pain, Other (comment)  Visit Diagnosis: Unsteadiness on feet  Muscle weakness (generalized)  Other symptoms and signs involving the musculoskeletal system  Other disturbances of skin sensation  Pain in right hand  Pain in left hand    Problem List Patient Active Problem List   Diagnosis Date Noted  . Amputation of both hands with complication, subsequent encounter 03/01/2017  . Diabetes ([place]) 02/19/2017  . Status post bilateral below knee amputation ([place]) 02/09/2017  . Wound disruption, post-op, skin, sequela 02/09/2017  . Debility 02/05/2017  . Ganglion upper arm, right   . Thrombosis of both upper extremities   . Suspected heparin induced thrombocytopenia (HIT) in hospitalized patient ([place])   .  Gangrene of lower extremity ([place])   . Heparin induced thrombocytopenia (HIT) ([place])   . Tracheostomy status ([place])   . Chest tube in place   . Atherosclerosis of native arteries of extremities with gangrene, left leg ([place])   . Atherosclerosis of native arteries of extremities with gangrene, right leg ([place])   . Acute on chronic diastolic CHF (congestive heart failure) ([place])   . Enteritis due to Clostridium difficile   . Anasarca   . FUO (fever of unknown origin)   . Acute encephalopathy   . Elevated LFTs   . DIC (disseminated intravascular coagulation) ([place]) 10/28/2016  . Postoperative atrial fibrillation ([place]) 10/24/2016  . Cardiogenic shock ([place])   . Mitral regurgitation due to cusp prolapse 10/22/2016  . S/P aortic valve replacement with bioprosthetic valve 10/22/2016  . S/P ascending aortic aneurysm repair 10/22/2016  . S/P mitral valve repair 10/22/2016  . S/P CABG x 3 10/22/2016  . Thrombocytopenia ([place]) 10/22/2016  . Acute combined systolic and diastolic congestive heart failure ([place]) 10/22/2016  . Acute respiratory failure ([place]) 10/22/2016  . Thoracic ascending aortic aneurysm ([place]) 10/21/2016  . Coronary artery disease involving native coronary artery of native heart with unstable angina pectoris ([place]) 10/20/2016  . Severe aortic stenosis by prior echocardiography 10/19/2016  . Unstable angina ([place]) 10/19/2016  . Aortic valve stenosis 10/19/2016  . Bicuspid aortic valve 10/19/2016  . Trigger ring finger of right hand   . Wears glasses   . Gout   . Hypertension   . Carpal tunnel syndrome of right wrist   .  Arthritis   . Snores     Mariah Milling, OTR/L 06/04/2017, 12:56 PM  [facility] [address] [place], [place], [address] Phone: [phone]   Fax:  [phone]  Name: ISSAIAH SEABROOKS MRN: [id number] Date of Birth: 08-06-1946

## 2017-06-04 NOTE — Therapy (Signed)
[facility] [address] [place], [place], [address] Phone: [phone]   Fax:  [phone]  Physical Therapy Treatment  Patient Details  Name: Scott [name] MRN: [id number] Date of Birth: 12/25/1945 Referring Provider: Meridee Score MD   Encounter Date: 06/04/2017      PT End of Session - 06/04/17 1106    Visit Number 11   Number of Visits 33   Date for PT Re-Evaluation 06/18/17   Authorization Type Medicare & G-codes every 10th visit    PT Start Time 1103   PT Stop Time 1145   PT Time Calculation (min) 42 min   Equipment Utilized During Treatment Gait belt   Activity Tolerance Patient tolerated treatment well   Behavior During Therapy WFL for tasks assessed/performed      Past Medical History:  Diagnosis Date  . Arthritis    "hips, shoulders; knees; back" (10/20/2016)  . Bicuspid aortic valve   . BPH (benign prostatic hypertrophy)   . Carpal tunnel syndrome of right wrist   . Coronary artery disease involving native coronary artery of native heart with unstable angina pectoris ([place]) 10/20/2016  . Diverticulitis   . GERD (gastroesophageal reflux disease)   . Gout   . Heart murmur   . Hyperlipemia   . Hypertension   . Postoperative atrial fibrillation ([place]) 10/24/2016  . RLS (restless legs syndrome)   . S/P aortic valve replacement with bioprosthetic valve 10/22/2016   25 mm [facility] Ease bovine pericardial bioprosthetic tissue valve  . S/P ascending aortic aneurysm repair 10/22/2016   28 mm supracoronary straight graft replacement of ascending thoracic aortic aneurysm  . S/P CABG x 3 10/22/2016   Sequential LIMA to Diag and LAD, SVG to distal LAD, open vein harvest right thigh  . S/P mitral valve repair 10/22/2016   Artificial Gore-tex neochord placement x6 - posterior annuloplasty band placed but removed due to systolic anterior motion of mitral valve  . Severe aortic stenosis   . Snores     Never been tested for sleep apnea  . Thoracic ascending aortic aneurysm ([place]) 10/21/2016  . Thrombocytopenia ([place]) 10/22/2016  . Type II diabetes mellitus ([place])     Past Surgical History:  Procedure Laterality Date  . AMPUTATION Bilateral 12/11/2016   Procedure: AMPUTATION BELOW KNEE BILATERALLY;  Surgeon: [name], MD;  Location: [facility];  Service: Orthopedics;  Laterality: Bilateral;  . AMPUTATION Bilateral 12/11/2016   Procedure: AMPUTATION BILATERAL HANDS EXCEPT RIGHT THUMB;  Surgeon: [name], MD;  Location: [facility];  Service: Orthopedics;  Laterality: Bilateral;  . AORTIC VALVE REPLACEMENT N/A 10/22/2016   Procedure: AORTIC VALVE REPLACEMENT (AVR) WITH SIZE 25 MM MAGNA EASE PERICARDIAL BIOPROSTHESIS - AORTIC;  Surgeon: Rexene Alberts, MD;  Location: [facility];  Service: Open Heart Surgery;  Laterality: N/A;  . BONE EXCISION Right 02/01/2017   Procedure: EXCISION RIGHT INDEX METACARPAL HEAD;  Surgeon: [name], MD;  Location: [facility];  Service: Orthopedics;  Laterality: Right;  . CARDIAC CATHETERIZATION N/A 10/20/2016   Procedure: Right/Left Heart Cath and Coronary Angiography;  Surgeon: Leonie Man, MD;  Location: [facility];  Service: Cardiovascular;  Laterality: N/A;  . CARPAL TUNNEL RELEASE Right 11/28/2013   Procedure: RIGHT WRIST CARPAL TUNNEL RELEASE;  Surgeon: Lorn Junes, MD;  Location: [facility];  Service: Orthopedics;  Laterality: Right;  . CATARACT EXTRACTION W/ INTRAOCULAR LENS  IMPLANT, BILATERAL Bilateral 1978  . COLONOSCOPY    .  CORONARY ARTERY BYPASS GRAFT N/A 10/22/2016   Procedure: CORONARY ARTERY BYPASS GRAFTING (CABG)x 2 WITH LIMA TO DIAGONAL, OPEN  HARVESTING OF RIGHT SAPHENOUS VEIN FOR VEIN GRAFT TO LAD;  Surgeon: Rexene Alberts, MD;  Location: [facility];  Service: Open Heart Surgery;  Laterality: N/A;  . FRACTURE SURGERY    . INGUINAL HERNIA REPAIR Right 1998  . IR GENERIC HISTORICAL  12/07/2016   IR US GUIDE VASC ACCESS RIGHT  12/07/2016 Aletta Edouard, MD [facility]  . IR GENERIC HISTORICAL  12/07/2016   IR RADIOLOGY PERIPHERAL GUIDED IV START 12/07/2016 Aletta Edouard, MD [facility]  . IR GENERIC HISTORICAL  12/07/2016   IR GASTROSTOMY TUBE MOD SED 12/07/2016 Aletta Edouard, MD [facility]  . LIPOMA EXCISION Right 2008   "side of my head"  . MITRAL VALVE REPAIR N/A 10/22/2016   Procedure: MITRAL VALVE REPAIR (MVR) WITH SIZE 30 SORIN ANNULOFLEX ANNULOPLASTY RING WITH SUBSEQUENT REMOVAL OF RING;  Surgeon: Rexene Alberts, MD;  Location: [facility];  Service: Open Heart Surgery;  Laterality: N/A;  . PENECTOMY  2007   Peyronie's disease   . PENILE PROSTHESIS IMPLANT  2009  . REMOVAL OF PENILE PROSTHESIS N/A 02/01/2017   Procedure: REMOVAL OF PENILE PROSTHESIS;  Surgeon: Kathie Rhodes, MD;  Location: [facility];  Service: Urology;  Laterality: N/A;  . SHOULDER OPEN ROTATOR CUFF REPAIR Right 2006  . STERNAL CLOSURE N/A 10/26/2016   Procedure: STERNAL WASHOUT AND DELAYED PRIMARY CLOSURE;  Surgeon: Rexene Alberts, MD;  Location: [facility];  Service: Thoracic;  Laterality: N/A;  . TEE WITHOUT CARDIOVERSION N/A 10/26/2016   Procedure: TRANSESOPHAGEAL ECHOCARDIOGRAM (TEE);  Surgeon: Rexene Alberts, MD;  Location: [facility];  Service: Thoracic;  Laterality: N/A;  . TEE WITHOUT CARDIOVERSION N/A 10/22/2016   Procedure: TRANSESOPHAGEAL ECHOCARDIOGRAM (TEE);  Surgeon: Rexene Alberts, MD;  Location: [facility];  Service: Open Heart Surgery;  Laterality: N/A;  . THORACIC AORTIC ANEURYSM REPAIR  10/22/2016   Procedure: ASCENDING AORTIC  ANEURYSM REPAIR (AAA) WITH 28 MM HEMASHIELD PLATINUM WOVEN DOUBLE VELOUR VASCULAR GRAFT;  Surgeon: Rexene Alberts, MD;  Location: [facility];  Service: Open Heart Surgery;;  . TONSILLECTOMY  ~ 1955  . TRACHEOSTOMY TUBE PLACEMENT N/A 11/09/2016   Procedure: TRACHEOSTOMY;  Surgeon: Rexene Alberts, MD;  Location: [facility];  Service: Thoracic;  Laterality: N/A;  . TRANSURETHRAL RESECTION OF PROSTATE  2005  . TRIGGER FINGER  RELEASE Bilateral    several lt and rt hands  . TRIGGER FINGER RELEASE Right 11/28/2013   Procedure: RIGHT TRIGGER FINGER  RELEASE (TENDON SHEATH INCISION);  Surgeon: Lorn Junes, MD;  Location: [facility];  Service: Orthopedics;  Laterality: Right;  Marland Kitchen VIDEO BRONCHOSCOPY N/A 11/09/2016   Procedure: VIDEO BRONCHOSCOPY;  Surgeon: Rexene Alberts, MD;  Location: [facility];  Service: Thoracic;  Laterality: N/A;  . WRIST FRACTURE SURGERY Right ~ 1959    There were no vitals filed for this visit.      Subjective Assessment - 06/04/17 1103    Subjective No new complaints. No falls or pain to report. Walking at home with family supervision (1 person). Has done the stairs once more since last session, still only to the landing and down. Multiple medication changes, EPIC updated.     Patient is accompained by: Family member   Pertinent History arthritis, bicuspid aortic valve, BPH, carpal tunnel syndrome in R wrist, CAD, diverticulitis, GERD, gout, hyperlipidemia, HTN, DM type II,thrombocytopenia, thoracic ascending aortic aneurysm, severe aortic  stenosis, amputation of bilateral hands except right thumb (12/11/2016) due to gangrene, bilateral transtibial amputations (12/11/2016) due to gangrene, R and L heart cath (10/20/2016)    Limitations Standing;Walking;House hold activities   Patient Stated Goals use prostheses to walk including stairs, outdoors, target shooting, drive   Currently in Pain? No/denies   Pain Score 0-No pain           [facility] Adult PT Treatment/Exercise - 06/04/17 1106      Transfers   Transfers Sit to Stand;Stand to Sit   Sit to Stand 4: Min guard;With upper extremity assist;From chair/3-in-1   Sit to Stand Details Verbal cues for safe use of DME/AE;Verbal cues for precautions/safety   Stand to Sit 4: Min guard;With upper extremity assist;To chair/3-in-1   Stand to Sit Details (indicate cue type and reason) Verbal cues for precautions/safety;Verbal cues for safe  use of DME/AE     Ambulation/Gait   Ambulation/Gait Yes   Ambulation/Gait Assistance 5: Supervision;4: Min guard   Ambulation/Gait Assistance Details cues on posture, to increase base of support at times (especially with turns) and to stay close to walker with gait.    Ambulation Distance (Feet) 280 Feet  x1, , plus around gym/barriers    Assistive device Rolling walker;Prostheses   Gait Pattern Step-through pattern;Decreased stride length;Trunk flexed;Wide base of support   Ambulation Surface Level;Indoor   Stairs Yes   Stairs Assistance 4: Min guard   Stairs Assistance Details (indicate cue type and reason) cues for foot placement and weight shifting   Stair Management Technique One rail Left;Step to pattern;Sideways;Forwards   Number of Stairs 4   Height of Stairs 6   Ramp 4: Min assist   Ramp Details (indicate cue type and reason) x1 rep with assist for walker management and balance. cues on posture, step length and walker proximity       Curb 4: Min assist   Curb Details (indicate cue type and reason) x3 reps. assistance needed for balance during walker transition. cues needed for stance position and to lift all 4 ends down vs just front 2 as pt would get stuck with right lateral lean needing assist to correct so to take other 2 legs down. this improved with repetition over the next 2 reps. cues to for foot placement with descent x 1st rep only.                                                    Prosthetics   Current prosthetic wear tolerance (days/week)  daily    Current prosthetic wear tolerance (#hours/day)  4 hours on, 2 hours off rotation throughout day   Residual limb condition  right limb: intact except for small blister on medial thigh at incision site. scap in place with bandaide over it. left LE: wounds along bottom of limb at incision appear to be healing. one small open area on incision where suture was pulled, covered with Tegaderm. Drainage noted under tegaderm, scant  amount. small area on medial incision covered with scab that is loose on ~ 1/2 of it, remainder is adhered. intact skin visable underneath.                          Education Provided Residual limb care;Correct ply sock adjustment;Proper wear schedule/adjustment;Proper weight-bearing schedule/adjustment   Person(s)  Educated Patient;Child(ren)   Education Method Explanation;Verbal cues;Handout   Education Method Verbalized understanding;Verbal cues required;Needs further instruction   Donning Prosthesis Minimal assist   Doffing Prosthesis Minimal assist           PT Short Term Goals - 05/24/17 1158      PT SHORT TERM GOAL #1   Title Patient will demonstrate ability to complete stairs with 1 rail on the L and supervision to indicate a decrease in risk of falling. (TARGET DATE: 06/18/2017)    Time 4   Period Weeks   Status New     PT SHORT TERM GOAL #2   Title Patient will demonstrate ability to ambulate 125ft with cane and min A (hand held assistance) to indicate a decrease in risk of falling. (TARGET DATE: 06/18/2017)    Time 4   Period Weeks   Status New     PT SHORT TERM GOAL #3   Title Patient will demonstrate ability to perform sit <> stand transfer from armless chair with S to indicate improvement in functional mobility and a decrease in risk of falling. (TARGET DATE: 06/18/2017)    Time 4   Period Weeks   Status New     PT SHORT TERM GOAL #4   Title Patient will demonstrate ability to reach anteriorly by 10 inches and to ankle level towards the ground with unilateral UE support without a loss of balance to indicate a decrease in his risk of falling. (TARGET DATE: 06/18/2017)    Time 4   Period Weeks   Status New           PT Long Term Goals - 04/28/17 1638      PT LONG TERM GOAL #1   Title Patient modified independently donnes & doffes, adjusts ply socks and pt / wife verbalize understanding of proper prosthetic care to ensure safe use of prostheses.  (Target Date:  08/20/2017)   Time 4   Period Months   Status On-going     PT LONG TERM GOAL #2   Title Patient will tolerate prostheses wear >90% of awake hours without skin issues or limb pain to demonstrate a decrease in his risk of falling. (Target Date: 08/20/2017)   Time 4   Period Months   Status On-going     PT LONG TERM GOAL #3   Title Patient performs standing balance activities with intermittent UE support reaching 10", picking up objects from floor and looks over shoulders with weight shift, trunk rotation with prostheses independently. (Target Date: 08/20/2017)     Time 4   Period Weeks   Status On-going     PT LONG TERM GOAL #4   Title Patient will ambulate 1000 feet including outdoor surfaces with LRAD and prostheses modified independently to enable community mobility.  (Target Date: 08/20/2017)   Time 4   Period Months   Status On-going     PT LONG TERM GOAL #5   Title Patient will negotiate ramp/curbs and stairs with LRAD and prostheses modified independent for community access. (Target Date: 08/20/2017)   Time 4   Period Months   Status On-going     PT LONG TERM GOAL #6   Title Patient's gait velocity will be >/= 1.86ft/s to indicate a limited community ambulator. (Target Date: 08/20/2017)   Time 4   Period Weeks   Status On-going     PT LONG TERM GOAL #7   Title Patient reports Activities of Balance Confindence score using FOTO >25% to  indicate greater confidence in his balance. (Target Date: 08/20/2017)   Time 4   Period Months   Status On-going            Plan - 06/04/17 1106    Clinical Impression Statement Today's skilled session continued to address mobility with prostheses, with emphasis placed on barriers. Pt is progressing well towards goals and should benefit from continued PT to progress toward unmet goals.    Rehab Potential Good   Clinical Impairments Affecting Rehab Potential arthritis, bicuspid aortic valve, BPH, carpal tunnel syndrome in R wrist, CAD,  diverticulitis, GERD, gout, hyperlipidemia, HTN, DM type II,thrombocytopenia, thoracic ascending aortic aneurysm, severe aortic stenosis, amputation of bilateral hands except right thumb (12/11/2016) due to gangrene, bilateral transtibial amputations (12/11/2016) due to gangrene, R and L heart cath (10/20/2016)    PT Frequency 2x / week   PT Duration Other (comment)  16 weeks   PT Treatment/Interventions ADLs/Self Care Home Management;Neuromuscular re-education;Balance training;Therapeutic exercise;Therapeutic activities;Functional mobility training;Stair training;Gait training;DME Instruction;Patient/family education;Prosthetic Training   PT Next Visit Plan progress prosthetic gait training including use of cane with quad tip + mod A hand held assist from PT and barriers with RW; progress balance activities including multi direction reaching;    Consulted and Agree with Plan of Care Patient;Family member/caregiver   Family Member Consulted daughter       Patient will benefit from skilled therapeutic intervention in order to improve the following deficits and impairments:  Abnormal gait, Decreased activity tolerance, Decreased balance, Decreased coordination, Decreased range of motion, Decreased mobility, Decreased knowledge of use of DME, Decreased knowledge of precautions, Decreased endurance, Decreased skin integrity, Decreased scar mobility, Decreased strength, Difficulty walking, Postural dysfunction, Prosthetic Dependency  Visit Diagnosis: Unsteadiness on feet  Muscle weakness (generalized)  Other symptoms and signs involving the musculoskeletal system     Problem List Patient Active Problem List   Diagnosis Date Noted  . Amputation of both hands with complication, subsequent encounter 03/01/2017  . Diabetes ([place]) 02/19/2017  . Status post bilateral below knee amputation ([place]) 02/09/2017  . Wound disruption, post-op, skin, sequela 02/09/2017  . Debility 02/05/2017  . Ganglion upper  arm, right   . Thrombosis of both upper extremities   . Suspected heparin induced thrombocytopenia (HIT) in hospitalized patient ([place])   . Gangrene of lower extremity ([place])   . Heparin induced thrombocytopenia (HIT) ([place])   . Tracheostomy status ([place])   . Chest tube in place   . Atherosclerosis of native arteries of extremities with gangrene, left leg ([place])   . Atherosclerosis of native arteries of extremities with gangrene, right leg ([place])   . Acute on chronic diastolic CHF (congestive heart failure) ([place])   . Enteritis due to Clostridium difficile   . Anasarca   . FUO (fever of unknown origin)   . Acute encephalopathy   . Elevated LFTs   . DIC (disseminated intravascular coagulation) ([place]) 10/28/2016  . Postoperative atrial fibrillation ([place]) 10/24/2016  . Cardiogenic shock ([place])   . Mitral regurgitation due to cusp prolapse 10/22/2016  . S/P aortic valve replacement with bioprosthetic valve 10/22/2016  . S/P ascending aortic aneurysm repair 10/22/2016  . S/P mitral valve repair 10/22/2016  . S/P CABG x 3 10/22/2016  . Thrombocytopenia ([place]) 10/22/2016  . Acute combined systolic and diastolic congestive heart failure ([place]) 10/22/2016  . Acute respiratory failure ([place]) 10/22/2016  . Thoracic ascending aortic aneurysm ([place]) 10/21/2016  . Coronary artery disease involving native coronary artery of native heart with unstable angina  pectoris ([place]) 10/20/2016  . Severe aortic stenosis by prior echocardiography 10/19/2016  . Unstable angina ([place]) 10/19/2016  . Aortic valve stenosis 10/19/2016  . Bicuspid aortic valve 10/19/2016  . Trigger ring finger of right hand   . Wears glasses   . Gout   . Hypertension   . Carpal tunnel syndrome of right wrist   . Arthritis   . Snores     [place], [place], [facility] [address], [place], [place] [address] [phone] 06/04/17, 4:23 PM   Name: Scott [name] MRN: [id number] Date of Birth:  Dec 22, 1945

## 2017-06-04 NOTE — Anesthesia Postprocedure Evaluation (Signed)
Anesthesia Post Note  Patient: Scott [name]  Procedure(s) Performed: Procedure(s) (LRB): REMOVAL OF PENILE PROSTHESIS (N/A) EXCISION RIGHT INDEX METACARPAL HEAD (Right)     Anesthesia Post Evaluation  Last Vitals:  Vitals:   02/01/17 1245 02/01/17 1250  BP:    Pulse: (!) 33   Resp: 20   Temp:  36.5 C    Last Pain:  Vitals:   02/01/17 1250  PainSc: Asleep                 Nolon Nations

## 2017-06-07 ENCOUNTER — Ambulatory Visit: Payer: Medicare Other | Admitting: Physical Therapy

## 2017-06-07 ENCOUNTER — Encounter: Payer: Self-pay | Admitting: Physical Therapy

## 2017-06-07 ENCOUNTER — Encounter: Payer: Self-pay | Admitting: Occupational Therapy

## 2017-06-07 ENCOUNTER — Ambulatory Visit: Payer: Medicare Other | Admitting: Occupational Therapy

## 2017-06-07 DIAGNOSIS — M79641 Pain in right hand: Secondary | ICD-10-CM

## 2017-06-07 DIAGNOSIS — R2689 Other abnormalities of gait and mobility: Secondary | ICD-10-CM | POA: Diagnosis not present

## 2017-06-07 DIAGNOSIS — R29898 Other symptoms and signs involving the musculoskeletal system: Secondary | ICD-10-CM

## 2017-06-07 DIAGNOSIS — M6281 Muscle weakness (generalized): Secondary | ICD-10-CM

## 2017-06-07 DIAGNOSIS — R2681 Unsteadiness on feet: Secondary | ICD-10-CM

## 2017-06-07 DIAGNOSIS — R293 Abnormal posture: Secondary | ICD-10-CM

## 2017-06-07 DIAGNOSIS — M79642 Pain in left hand: Secondary | ICD-10-CM | POA: Diagnosis not present

## 2017-06-07 DIAGNOSIS — R208 Other disturbances of skin sensation: Secondary | ICD-10-CM

## 2017-06-07 NOTE — Patient Instructions (Signed)
Local Driver Evaluation Programs: ° °Comprehensive Evaluation: includes clinical and in vehicle behind the wheel testing by OCCUPATIONAL THERAPIST. Programs have varying levels of adaptive controls available for trial.  ° °[facility] °[address] °[place], [place]  [address] °[phone] or [phone] °http://www.driver-rehab.com °Evaluator:  Cyndee Crompton, OT/CDRS/CDI/SCDCM/Low Vision Certification ° °[facility] °[address] °[place], [place] [address] °[phone] °https://www.[facility].org/home/services/rehabilitation.aspx °Evaluators:  Shannon Sheek, OT and Jill Tucker, OT ° °[facility] [place] [facility] (ONLY SERVES VETERANS!!) °[facility] °[address] °[place], [place]  [address] °[phone] °http://www.[place].va.gov/services/Physical_Medicine_Rehabilitation_Services.asp °Evaluators:  Eric Andrews, KT; Heidi Harris, KT;  Gary Whitaker, KT (KT=kiniesotherapist) ° ° °Clinical evaluations only:  Includes clinical testing, refers to other programs or local certified driving instructor for behind the wheel testing. ° °[facility] (outpatient Rehab) °[facility] °[address] °[place], [place] [address] °[phone] for scheduling °http://www.[facility].edu/Outpatient-Rehabilitation/Neurorehabilitation-Therapy.htm °Evaluators:  Kelly Lambeth, OT; Kate Phillips, OT ° °Other area clinical evaluators available upon request including [facility], [facility] and [facility]. ° ° °    Resource List °What is a Driver Evaluation: °Your Road Ahead - A Guide to Comprehensive Driving Evaluations °http://www.thehartford.com/resources/mature-market-excellence/publications-on-aging ° °[employer] for [employer] - Disability and Driving Fact Sheets °http://www.aded.net/?page=510 ° °Driving after a Brain  Injury: °[employer] °http://www.biausa.org/tbims-abstracts/if-there-is-an-effective-way-to-determine-if-someone-is-ready-to-drive-after-tbi?A=SearchResult&SearchID=9495675&ObjectID=2758842&ObjectType=35 ° °Driving with Adaptive Equipment: °[facility] Process °http://www.driver-rehab.com/adaptive-equipment ° °[employer] °http://www.nmeda.com/ ° ° ° ° ° ° °  °

## 2017-06-07 NOTE — Therapy (Signed)
[facility] [address] [place], [place], [address] Phone: [phone]   Fax:  [phone]  Physical Therapy Treatment  Patient Details  Name: Scott [name] MRN: [id number] Date of Birth: 03/21/1946 Referring Provider: Meridee Score MD   Encounter Date: 06/07/2017      PT End of Session - 06/07/17 1459    Visit Number 12   Number of Visits 33   Date for PT Re-Evaluation 06/18/17   Authorization Type Medicare & G-codes every 10th visit    PT Start Time 1100   PT Stop Time 1145   PT Time Calculation (min) 45 min   Equipment Utilized During Treatment Gait belt   Activity Tolerance Patient tolerated treatment well   Behavior During Therapy [facility] for tasks assessed/performed      Past Medical History:  Diagnosis Date  . Arthritis    "hips, shoulders; knees; back" (10/20/2016)  . Bicuspid aortic valve   . BPH (benign prostatic hypertrophy)   . Carpal tunnel syndrome of right wrist   . Coronary artery disease involving native coronary artery of native heart with unstable angina pectoris ([place]) 10/20/2016  . Diverticulitis   . GERD (gastroesophageal reflux disease)   . Gout   . Heart murmur   . Hyperlipemia   . Hypertension   . Postoperative atrial fibrillation ([place]) 10/24/2016  . RLS (restless legs syndrome)   . S/P aortic valve replacement with bioprosthetic valve 10/22/2016   25 mm [facility] Ease bovine pericardial bioprosthetic tissue valve  . S/P ascending aortic aneurysm repair 10/22/2016   28 mm supracoronary straight graft replacement of ascending thoracic aortic aneurysm  . S/P CABG x 3 10/22/2016   Sequential LIMA to Diag and LAD, SVG to distal LAD, open vein harvest right thigh  . S/P mitral valve repair 10/22/2016   Artificial Gore-tex neochord placement x6 - posterior annuloplasty band placed but removed due to systolic anterior motion of mitral valve  . Severe aortic stenosis   . Snores    Never been tested for sleep apnea  . Thoracic ascending aortic aneurysm ([place]) 10/21/2016  . Thrombocytopenia ([place]) 10/22/2016  . Type II diabetes mellitus ([place])     Past Surgical History:  Procedure Laterality Date  . AMPUTATION Bilateral 12/11/2016   Procedure: AMPUTATION BELOW KNEE BILATERALLY;  Surgeon: [name], MD;  Location: [place];  Service: Orthopedics;  Laterality: Bilateral;  . AMPUTATION Bilateral 12/11/2016   Procedure: AMPUTATION BILATERAL HANDS EXCEPT RIGHT THUMB;  Surgeon: [name], MD;  Location: [place];  Service: Orthopedics;  Laterality: Bilateral;  . AORTIC VALVE REPLACEMENT N/A 10/22/2016   Procedure: AORTIC VALVE REPLACEMENT (AVR) WITH SIZE 25 MM MAGNA EASE PERICARDIAL BIOPROSTHESIS - AORTIC;  Surgeon: Rexene Alberts, MD;  Location: [place];  Service: Open Heart Surgery;  Laterality: N/A;  . BONE EXCISION Right 02/01/2017   Procedure: EXCISION RIGHT INDEX METACARPAL HEAD;  Surgeon: [name], MD;  Location: [facility];  Service: Orthopedics;  Laterality: Right;  . CARDIAC CATHETERIZATION N/A 10/20/2016   Procedure: Right/Left Heart Cath and Coronary Angiography;  Surgeon: Leonie Man, MD;  Location: [facility];  Service: Cardiovascular;  Laterality: N/A;  . CARPAL TUNNEL RELEASE Right 11/28/2013   Procedure: RIGHT WRIST CARPAL TUNNEL RELEASE;  Surgeon: Lorn Junes, MD;  Location: [place];  Service: Orthopedics;  Laterality: Right;  . CATARACT EXTRACTION W/ INTRAOCULAR LENS  IMPLANT, BILATERAL Bilateral 1978  . COLONOSCOPY    .  CORONARY ARTERY BYPASS GRAFT N/A 10/22/2016   Procedure: CORONARY ARTERY BYPASS GRAFTING (CABG)x 2 WITH LIMA TO DIAGONAL, OPEN  HARVESTING OF RIGHT SAPHENOUS VEIN FOR VEIN GRAFT TO LAD;  Surgeon: Rexene Alberts, MD;  Location: [facility];  Service: Open Heart Surgery;  Laterality: N/A;  . FRACTURE SURGERY    . INGUINAL HERNIA REPAIR Right 1998  . IR GENERIC HISTORICAL  12/07/2016   IR US GUIDE VASC ACCESS RIGHT  12/07/2016 Aletta Edouard, MD [facility]  . IR GENERIC HISTORICAL  12/07/2016   IR RADIOLOGY PERIPHERAL GUIDED IV START 12/07/2016 Aletta Edouard, MD [facility]  . IR GENERIC HISTORICAL  12/07/2016   IR GASTROSTOMY TUBE MOD SED 12/07/2016 Aletta Edouard, MD [facility]  . LIPOMA EXCISION Right 2008   "side of my head"  . MITRAL VALVE REPAIR N/A 10/22/2016   Procedure: MITRAL VALVE REPAIR (MVR) WITH SIZE 30 SORIN ANNULOFLEX ANNULOPLASTY RING WITH SUBSEQUENT REMOVAL OF RING;  Surgeon: Rexene Alberts, MD;  Location: [place];  Service: Open Heart Surgery;  Laterality: N/A;  . PENECTOMY  2007   Peyronie's disease   . PENILE PROSTHESIS IMPLANT  2009  . REMOVAL OF PENILE PROSTHESIS N/A 02/01/2017   Procedure: REMOVAL OF PENILE PROSTHESIS;  Surgeon: Kathie Rhodes, MD;  Location: [place] [facility];  Service: Urology;  Laterality: N/A;  . SHOULDER OPEN ROTATOR CUFF REPAIR Right 2006  . STERNAL CLOSURE N/A 10/26/2016   Procedure: STERNAL WASHOUT AND DELAYED PRIMARY CLOSURE;  Surgeon: Rexene Alberts, MD;  Location: [place];  Service: Thoracic;  Laterality: N/A;  . TEE WITHOUT CARDIOVERSION N/A 10/26/2016   Procedure: TRANSESOPHAGEAL ECHOCARDIOGRAM (TEE);  Surgeon: Rexene Alberts, MD;  Location: [place];  Service: Thoracic;  Laterality: N/A;  . TEE WITHOUT CARDIOVERSION N/A 10/22/2016   Procedure: TRANSESOPHAGEAL ECHOCARDIOGRAM (TEE);  Surgeon: Rexene Alberts, MD;  Location: [facility];  Service: Open Heart Surgery;  Laterality: N/A;  . THORACIC AORTIC ANEURYSM REPAIR  10/22/2016   Procedure: ASCENDING AORTIC  ANEURYSM REPAIR (AAA) WITH 28 MM HEMASHIELD PLATINUM WOVEN DOUBLE VELOUR VASCULAR GRAFT;  Surgeon: Rexene Alberts, MD;  Location: [facility];  Service: Open Heart Surgery;;  . TONSILLECTOMY  ~ 1955  . TRACHEOSTOMY TUBE PLACEMENT N/A 11/09/2016   Procedure: TRACHEOSTOMY;  Surgeon: Rexene Alberts, MD;  Location: [place];  Service: Thoracic;  Laterality: N/A;  . TRANSURETHRAL RESECTION OF PROSTATE  2005  . TRIGGER FINGER  RELEASE Bilateral    several lt and rt hands  . TRIGGER FINGER RELEASE Right 11/28/2013   Procedure: RIGHT TRIGGER FINGER  RELEASE (TENDON SHEATH INCISION);  Surgeon: Lorn Junes, MD;  Location: [facility];  Service: Orthopedics;  Laterality: Right;  Mar[place] VIDEO BRONCHOSCOPY N/A 11/09/2016   Procedure: VIDEO BRONCHOSCOPY;  Surgeon: Rexene Alberts, MD;  Location: [place];  Service: Thoracic;  Laterality: N/A;  . WRIST FRACTURE SURGERY Right ~ 1959    There were no vitals filed for this visit.      Subjective Assessment - 06/07/17 1105    Subjective He is wearing prostheses 4 hrs 2x/day and wounds appear to be healing. No falls.    Patient is accompained by: Family member   Pertinent History arthritis, bicuspid aortic valve, BPH, carpal tunnel syndrome in R wrist, CAD, diverticulitis, GERD, gout, hyperlipidemia, HTN, DM type II,thrombocytopenia, thoracic ascending aortic aneurysm, severe aortic stenosis, amputation of bilateral hands except right thumb (12/11/2016) due to gangrene, bilateral transtibial amputations (12/11/2016) due to gangrene, R and L heart cath (  10/20/2016)    Limitations Standing;Walking;House hold activities   Patient Stated Goals use prostheses to walk including stairs, outdoors, target shooting, drive   Currently in Pain? No/denies                         [facility] Adult PT Treatment/Exercise - 06/07/17 1100      Transfers   Sit to Stand 4: Min guard;With armrests;With upper extremity assist;From chair/3-in-1  light touch to stabilize   Stand to Sit 5: Supervision;With upper extremity assist;With armrests;To chair/3-in-1     Ambulation/Gait   Ambulation/Gait Yes   Ambulation/Gait Assistance 3: Mod assist;4: Min assist  initially modA 2 people for safety, progressed to [facility]   Ambulation/Gait Assistance Details Initiated gait with cane with pt using step-to pattern that was slow & unsteady. PT progressed to BUE support with PT & PTA  progressing to support on anterior shoulder to on belt with occasional touch / support UE/hand for balance loss. PT demo, verbal cues on step thru pattern with equal step length & relationship to balance. Pt ambulated with LUE support on table & RUE hand hold minA but ht of table caused lateral trunk lean. PT progressed to LUE support on wall & RUE hand hold to anterior shoulder support.    Ambulation Distance (Feet) 100 Feet  100' X 4   Assistive device Prostheses;2 person hand held assist;1 person hand held assist  1 person hand hold & wall support   Ambulation Surface Indoor;Level     Prosthetics   Prosthetic Care Comments  Use of cut-off socks distally for volume adjustment & proximally under liner for sweat management. Long incision scab gone with superficial opening 6mm at each end. Minor blood drainage in Tegaderm. PT replaced Tegaderm & recommended continued use. PT recommended switching liners / cleaning with bathing and donning other set after bath to get to bed.    Current prosthetic wear tolerance (days/week)  daily    Current prosthetic wear tolerance (#hours/day)  4 hours on, 2 hours off rotation throughout day   Residual limb condition  left limb with 2 openings 28mm with minor blood drainage and lateral thigh single folliculitis from sweat probally   Education Provided Skin check;Residual limb care;Prosthetic cleaning;Proper wear schedule/adjustment;Proper [place];Correct ply sock adjustment;Other (comment)  see prosthetic care.    Person(s) Educated Patient;Spouse   Education Method Explanation;Demonstration;Verbal cues   Education Method Verbalized understanding;Needs further instruction                  PT Short Term Goals - 06/07/17 1500      PT SHORT TERM GOAL #1   Title Patient will demonstrate ability to complete stairs with 1 rail on the L and supervision to indicate a decrease in risk of falling. (TARGET DATE: 06/18/2017)    Time 4   Period Weeks   Status  On-going     PT SHORT TERM GOAL #2   Title Patient will demonstrate ability to ambulate 141ft with cane and min A (hand held assistance) to indicate a decrease in risk of falling. (TARGET DATE: 06/18/2017)    Time 4   Period Weeks   Status On-going     PT SHORT TERM GOAL #3   Title Patient will demonstrate ability to perform sit <> stand transfer from armless chair with S to indicate improvement in functional mobility and a decrease in risk of falling. (TARGET DATE: 06/18/2017)    Time 4   Period [place]  Status On-going     PT SHORT TERM GOAL #4   Title Patient will demonstrate ability to reach anteriorly by 10 inches and to ankle level towards the ground with unilateral UE support without a loss of balance to indicate a decrease in his risk of falling. (TARGET DATE: 06/18/2017)    Time 4   Period Weeks   Status On-going           PT Long Term Goals - 04/28/17 1638      PT LONG TERM GOAL #1   Title Patient modified independently donnes & doffes, adjusts ply socks and pt / wife verbalize understanding of proper prosthetic care to ensure safe use of prostheses.  (Target Date: 08/20/2017)   Time 4   Period Months   Status On-going     PT LONG TERM GOAL #2   Title Patient will tolerate prostheses wear >90% of awake hours without skin issues or limb pain to demonstrate a decrease in his risk of falling. (Target Date: 08/20/2017)   Time 4   Period Months   Status On-going     PT LONG TERM GOAL #3   Title Patient performs standing balance activities with intermittent UE support reaching 10", picking up objects from floor and looks over shoulders with weight shift, trunk rotation with prostheses independently. (Target Date: 08/20/2017)     Time 4   Period Weeks   Status On-going     PT LONG TERM GOAL #4   Title Patient will ambulate 1000 feet including outdoor surfaces with LRAD and prostheses modified independently to enable community mobility.  (Target Date: 08/20/2017)   Time 4    Period Months   Status On-going     PT LONG TERM GOAL #5   Title Patient will negotiate ramp/curbs and stairs with LRAD and prostheses modified independent for community access. (Target Date: 08/20/2017)   Time 4   Period Months   Status On-going     PT LONG TERM GOAL #6   Title Patient's gait velocity will be >/= 1.84ft/s to indicate a limited community ambulator. (Target Date: 08/20/2017)   Time 4   Period Weeks   Status On-going     PT LONG TERM GOAL #7   Title Patient reports Activities of Balance Confindence score using FOTO >25% to indicate greater confidence in his balance. (Target Date: 08/20/2017)   Time 4   Period Months   Status On-going               Plan - 06/07/17 1500    Clinical Impression Statement Skilled session progressed gait to no AD with progressively decreasing UE support on surfaces. Patient improving balance reaction with sit to stand with less support to stand. Prosthetic gait with cane seemed so slow that it negatively impacted his balance.    Rehab Potential Good   Clinical Impairments Affecting Rehab Potential arthritis, bicuspid aortic valve, BPH, carpal tunnel syndrome in R wrist, CAD, diverticulitis, GERD, gout, hyperlipidemia, HTN, DM type II,thrombocytopenia, thoracic ascending aortic aneurysm, severe aortic stenosis, amputation of bilateral hands except right thumb (12/11/2016) due to gangrene, bilateral transtibial amputations (12/11/2016) due to gangrene, R and L heart cath (10/20/2016)    PT Frequency 2x / week   PT Duration Other (comment)  16 weeks   PT Treatment/Interventions ADLs/Self Care Home Management;Neuromuscular re-education;Balance training;Therapeutic exercise;Therapeutic activities;Functional mobility training;Stair training;Gait training;DME Instruction;Patient/family education;Prosthetic Training   PT Next Visit Plan progress prosthetic gait training and barriers with RW; progress balance activities including multi  direction  reaching;    Consulted and Agree with Plan of Care Patient;Family member/caregiver   Family Member Consulted wife      Patient will benefit from skilled therapeutic intervention in order to improve the following deficits and impairments:  Abnormal gait, Decreased activity tolerance, Decreased balance, Decreased coordination, Decreased range of motion, Decreased mobility, Decreased knowledge of use of DME, Decreased knowledge of precautions, Decreased endurance, Decreased skin integrity, Decreased scar mobility, Decreased strength, Difficulty walking, Postural dysfunction, Prosthetic Dependency  Visit Diagnosis: Unsteadiness on feet  Muscle weakness (generalized)  Other symptoms and signs involving the musculoskeletal system  Abnormal posture  Other abnormalities of gait and mobility     Problem List Patient Active Problem List   Diagnosis Date Noted  . Amputation of both hands with complication, subsequent encounter 03/01/2017  . Diabetes ([place]) 02/19/2017  . Status post bilateral below knee amputation ([place]) 02/09/2017  . Wound disruption, post-op, skin, sequela 02/09/2017  . Debility 02/05/2017  . Ganglion upper arm, right   . Thrombosis of both upper extremities   . Suspected heparin induced thrombocytopenia (HIT) in hospitalized patient ([place])   . Gangrene of lower extremity ([place])   . Heparin induced thrombocytopenia (HIT) ([place])   . Tracheostomy status ([place])   . Chest tube in place   . Atherosclerosis of native arteries of extremities with gangrene, left leg ([place])   . Atherosclerosis of native arteries of extremities with gangrene, right leg ([place])   . Acute on chronic diastolic CHF (congestive heart failure) ([place])   . Enteritis due to Clostridium difficile   . Anasarca   . FUO (fever of unknown origin)   . Acute encephalopathy   . Elevated LFTs   . DIC (disseminated intravascular coagulation) ([place]) 10/28/2016  . Postoperative atrial fibrillation ([place]) 10/24/2016  .  Cardiogenic shock ([place])   . Mitral regurgitation due to cusp prolapse 10/22/2016  . S/P aortic valve replacement with bioprosthetic valve 10/22/2016  . S/P ascending aortic aneurysm repair 10/22/2016  . S/P mitral valve repair 10/22/2016  . S/P CABG x 3 10/22/2016  . Thrombocytopenia ([place]) 10/22/2016  . Acute combined systolic and diastolic congestive heart failure ([place]) 10/22/2016  . Acute respiratory failure ([place]) 10/22/2016  . Thoracic ascending aortic aneurysm ([place]) 10/21/2016  . Coronary artery disease involving native coronary artery of native heart with unstable angina pectoris ([place]) 10/20/2016  . Severe aortic stenosis by prior echocardiography 10/19/2016  . Unstable angina ([place]) 10/19/2016  . Aortic valve stenosis 10/19/2016  . Bicuspid aortic valve 10/19/2016  . Trigger ring finger of right hand   . Wears glasses   . Gout   . Hypertension   . Carpal tunnel syndrome of right wrist   . Arthritis   . Snores     Vastie Douty PT, DPT 06/07/2017, 3:23 PM  [facility] [address] [place], [place], [address] Phone: [phone]   Fax:  [phone]  Name: Scott [name] MRN: [id number] Date of Birth: 1946-07-06

## 2017-06-07 NOTE — Therapy (Signed)
[facility] [address] [place], [place], [address] Phone: [phone]   Fax:  [phone]  Occupational Therapy Treatment  Patient Details  Name: Scott [name] MRN: [id number] Date of Birth: 1946/08/21 Referring Provider: Dr. Meridee Score  Encounter Date: 06/07/2017      OT End of Session - 06/07/17 1303    Visit Number 5   Number of Visits 16   Date for OT Re-Evaluation 07/15/17   Authorization Type medicare pt will need G code and PN every 10th visit   Authorization Time Period 60 days   Authorization - Visit Number 5   Authorization - Number of Visits 10   OT Start Time 1146   OT Stop Time 1231   OT Time Calculation (min) 45 min   Activity Tolerance Patient tolerated treatment well      Past Medical History:  Diagnosis Date  . Arthritis    "hips, shoulders; knees; back" (10/20/2016)  . Bicuspid aortic valve   . BPH (benign prostatic hypertrophy)   . Carpal tunnel syndrome of right wrist   . Coronary artery disease involving native coronary artery of native heart with unstable angina pectoris ([place]) 10/20/2016  . Diverticulitis   . GERD (gastroesophageal reflux disease)   . Gout   . Heart murmur   . Hyperlipemia   . Hypertension   . Postoperative atrial fibrillation ([place]) 10/24/2016  . RLS (restless legs syndrome)   . S/P aortic valve replacement with bioprosthetic valve 10/22/2016   25 mm [facility] Ease bovine pericardial bioprosthetic tissue valve  . S/P ascending aortic aneurysm repair 10/22/2016   28 mm supracoronary straight graft replacement of ascending thoracic aortic aneurysm  . S/P CABG x 3 10/22/2016   Sequential LIMA to Diag and LAD, SVG to distal LAD, open vein harvest right thigh  . S/P mitral valve repair 10/22/2016   Artificial Gore-tex neochord placement x6 - posterior annuloplasty band placed but removed due to systolic anterior motion of mitral valve  . Severe aortic stenosis    . Snores    Never been tested for sleep apnea  . Thoracic ascending aortic aneurysm ([place]) 10/21/2016  . Thrombocytopenia ([place]) 10/22/2016  . Type II diabetes mellitus ([place])     Past Surgical History:  Procedure Laterality Date  . AMPUTATION Bilateral 12/11/2016   Procedure: AMPUTATION BELOW KNEE BILATERALLY;  Surgeon: [name], MD;  Location: [facility];  Service: Orthopedics;  Laterality: Bilateral;  . AMPUTATION Bilateral 12/11/2016   Procedure: AMPUTATION BILATERAL HANDS EXCEPT RIGHT THUMB;  Surgeon: [name], MD;  Location: [facility];  Service: Orthopedics;  Laterality: Bilateral;  . AORTIC VALVE REPLACEMENT N/A 10/22/2016   Procedure: AORTIC VALVE REPLACEMENT (AVR) WITH SIZE 25 MM MAGNA EASE PERICARDIAL BIOPROSTHESIS - AORTIC;  Surgeon: Rexene Alberts, MD;  Location: [facility];  Service: Open Heart Surgery;  Laterality: N/A;  . BONE EXCISION Right 02/01/2017   Procedure: EXCISION RIGHT INDEX METACARPAL HEAD;  Surgeon: [name], MD;  Location: [facility];  Service: Orthopedics;  Laterality: Right;  . CARDIAC CATHETERIZATION N/A 10/20/2016   Procedure: Right/Left Heart Cath and Coronary Angiography;  Surgeon: Leonie Man, MD;  Location: [facility];  Service: Cardiovascular;  Laterality: N/A;  . CARPAL TUNNEL RELEASE Right 11/28/2013   Procedure: RIGHT WRIST CARPAL TUNNEL RELEASE;  Surgeon: Lorn Junes, MD;  Location: [facility];  Service: Orthopedics;  Laterality: Right;  . CATARACT EXTRACTION W/ INTRAOCULAR LENS  IMPLANT, BILATERAL  Bilateral 1978  . COLONOSCOPY    . CORONARY ARTERY BYPASS GRAFT N/A 10/22/2016   Procedure: CORONARY ARTERY BYPASS GRAFTING (CABG)x 2 WITH LIMA TO DIAGONAL, OPEN  HARVESTING OF RIGHT SAPHENOUS VEIN FOR VEIN GRAFT TO LAD;  Surgeon: Rexene Alberts, MD;  Location: [facility];  Service: Open Heart Surgery;  Laterality: N/A;  . FRACTURE SURGERY    . INGUINAL HERNIA REPAIR Right 1998  . IR GENERIC HISTORICAL  12/07/2016   IR US GUIDE VASC  ACCESS RIGHT 12/07/2016 Aletta Edouard, MD [facility]  . IR GENERIC HISTORICAL  12/07/2016   IR RADIOLOGY PERIPHERAL GUIDED IV START 12/07/2016 Aletta Edouard, MD [facility]  . IR GENERIC HISTORICAL  12/07/2016   IR GASTROSTOMY TUBE MOD SED 12/07/2016 Aletta Edouard, MD [facility]  . LIPOMA EXCISION Right 2008   "side of my head"  . MITRAL VALVE REPAIR N/A 10/22/2016   Procedure: MITRAL VALVE REPAIR (MVR) WITH SIZE 30 SORIN ANNULOFLEX ANNULOPLASTY RING WITH SUBSEQUENT REMOVAL OF RING;  Surgeon: Rexene Alberts, MD;  Location: [facility];  Service: Open Heart Surgery;  Laterality: N/A;  . PENECTOMY  2007   Peyronie's disease   . PENILE PROSTHESIS IMPLANT  2009  . REMOVAL OF PENILE PROSTHESIS N/A 02/01/2017   Procedure: REMOVAL OF PENILE PROSTHESIS;  Surgeon: Kathie Rhodes, MD;  Location: [facility];  Service: Urology;  Laterality: N/A;  . SHOULDER OPEN ROTATOR CUFF REPAIR Right 2006  . STERNAL CLOSURE N/A 10/26/2016   Procedure: STERNAL WASHOUT AND DELAYED PRIMARY CLOSURE;  Surgeon: Rexene Alberts, MD;  Location: [facility];  Service: Thoracic;  Laterality: N/A;  . TEE WITHOUT CARDIOVERSION N/A 10/26/2016   Procedure: TRANSESOPHAGEAL ECHOCARDIOGRAM (TEE);  Surgeon: Rexene Alberts, MD;  Location: [facility];  Service: Thoracic;  Laterality: N/A;  . TEE WITHOUT CARDIOVERSION N/A 10/22/2016   Procedure: TRANSESOPHAGEAL ECHOCARDIOGRAM (TEE);  Surgeon: Rexene Alberts, MD;  Location: [facility];  Service: Open Heart Surgery;  Laterality: N/A;  . THORACIC AORTIC ANEURYSM REPAIR  10/22/2016   Procedure: ASCENDING AORTIC  ANEURYSM REPAIR (AAA) WITH 28 MM HEMASHIELD PLATINUM WOVEN DOUBLE VELOUR VASCULAR GRAFT;  Surgeon: Rexene Alberts, MD;  Location: [facility];  Service: Open Heart Surgery;;  . TONSILLECTOMY  ~ 1955  . TRACHEOSTOMY TUBE PLACEMENT N/A 11/09/2016   Procedure: TRACHEOSTOMY;  Surgeon: Rexene Alberts, MD;  Location: [facility];  Service: Thoracic;  Laterality: N/A;  . TRANSURETHRAL RESECTION OF PROSTATE  2005  .  TRIGGER FINGER RELEASE Bilateral    several lt and rt hands  . TRIGGER FINGER RELEASE Right 11/28/2013   Procedure: RIGHT TRIGGER FINGER  RELEASE (TENDON SHEATH INCISION);  Surgeon: Lorn Junes, MD;  Location: [facility];  Service: Orthopedics;  Laterality: Right;  Marland Kitchen VIDEO BRONCHOSCOPY N/A 11/09/2016   Procedure: VIDEO BRONCHOSCOPY;  Surgeon: Rexene Alberts, MD;  Location: [facility];  Service: Thoracic;  Laterality: N/A;  . WRIST FRACTURE SURGERY Right ~ 1959    There were no vitals filed for this visit.      Subjective Assessment - 06/07/17 1146    Subjective  I tried the equipment for the tolet aid and with modifications it worked   Patient is accompained by: Family member  wife   Pertinent History see epic. Pt with Bil BKA as well as bilateral UE amputations of all fingers excepet L thumb at MCP joint due to gangrene.    Patient Stated Goals walk, drive a car and go where I want to go.  Wife hopes pt can go out to eat in a restaurant and use public bathroom and eat .    Currently in Pain? No/denies                      OT Treatments/Exercises (OP) - 06/07/17 0001      ADLs   Toileting Pt reports he purchase toileting aid discussed last session and modified it slightly and is now able to use for toilet hygiene.  Discussed likely need to purchase raised toilet seat for toilet upstairs as well (pt has on downstairs toilet).     Functional Mobility Pt and wife stated pt was able to do a full set of stairs and go up to the second floor at home. Pt's bedroom and full bath are on the second floor. Wife wanted to see transfer tub bench transfer - completed transfer with cues about when to take off and put back on LE prostheses.  Pt will shower at night, ensure residual limbs are as dry as possilble and change out clean liners, ambulate only to bedroom and then remove prostheses to allow skin to fullly dry overnight.  Discussed use of hand mitts with soap inside or use  of liquid soap.  Modified long handled sponge with blue foam and pt able hold and use, discussed modified way to wash bottom and hair.  Pt to try and palm hand held shower and if unable will explore options to modify.  Pt and wife to try shower in next few days.     Driving Educated pt and wife on driving evaluation and provided information in writing. Pt and wife aware that pt will need medical clearance for assessment. Also discussed waiting until pt's has hand prosthesis as this may impact any recomendations for equipment. Pt and wife verbalized understanding.    ADL Comments Discussed use of walker bag to allow pt to carry simple beverage or simple snack as well as other items while using walker. Pt's wife to make. Pt reports he is using opposition splint at home as needed.                OT Education - 06/07/17 1258    Education provided Yes   Education Details driving eval, reviewed tub bench transfers, other ADL issues   Person(s) Educated Patient;Spouse   Methods Explanation;Demonstration;Handout   Comprehension Verbalized understanding;Returned demonstration          OT Short Term Goals - 06/07/17 1259      OT SHORT TERM GOAL #1   Title PT and wife will be mod I with home activities program - 06/14/2017   Status On-going     OT SHORT TERM GOAL #2   Title Pt will be supervision for 3 in 1 commode transfers   Status Achieved     OT SHORT TERM GOAL #3   Title Pt will demonstrate ability to use opposition splint with prn with basic ADL tasks   Status Achieved     OT SHORT TERM GOAL #4   Title Pt will be able to complete simple cold snack prep with min a   Status On-going           OT Long Term Goals - 06/07/17 1259      OT LONG TERM GOAL #1   Title Pt will be mod I with home activities program - 07/12/2017   Status On-going     OT LONG TERM GOAL #2   Title Pt  will be mod I with toilet transfers   Status On-going     OT LONG TERM GOAL #3   Title IF pt is  able to complete stairs, pt will be mod I with tub bench transfers   Status On-going     OT LONG TERM GOAL #4   Title Pt will verbalize understanding of driving eval information   Status Achieved     OT LONG TERM GOAL #5   Title Pt will be mod I with toilet hygiene with AE   Status Achieved     OT LONG TERM GOAL #6   Title Pt will be mod I with buttoning, zipping using AE/prostheses   Status On-going     OT LONG TERM GOAL #7   Title Pt will be mod I with cutting food with AE/prostheses   Status On-going     OT LONG TERM GOAL #8   Title Pt will be able to write at 3 sentence level with 100% legibility with AE.    Status On-going               Plan - 06/07/17 1259    Clinical Impression Statement Pt progressing toward goals.  Pt now able to do stairs with assistance therefore pt and wife will attempt shower   Rehab Potential Fair   OT Frequency 2x / week   OT Duration 8 weeks   OT Treatment/Interventions Self-care/ADL training;Moist Heat;Cryotherapy;Ultrasound;Fluidtherapy;DME and/or AE instruction;Neuromuscular education;Therapeutic exercise;Therapist, nutritional;Therapeutic activities;Splinting;Patient/family education;Balance training   Plan pt and wife picking up L hand prosthesis today - incorporate into functional use as possible.  Continue to address goals.    Consulted and Agree with Plan of Care Patient;Family member/caregiver   Family Member Consulted wife      Patient will benefit from skilled therapeutic intervention in order to improve the following deficits and impairments:  Abnormal gait, Decreased activity tolerance, Decreased balance, Decreased mobility, Decreased knowledge of use of DME, Difficulty walking, Impaired UE functional use, Impaired sensation, Pain, Other (comment)  Visit Diagnosis: Unsteadiness on feet  Other symptoms and signs involving the musculoskeletal system  Other disturbances of skin sensation  Pain in right hand  Pain  in left hand    Problem List Patient Active Problem List   Diagnosis Date Noted  . Amputation of both hands with complication, subsequent encounter 03/01/2017  . Diabetes ([place]) 02/19/2017  . Status post bilateral below knee amputation ([place]) 02/09/2017  . Wound disruption, post-op, skin, sequela 02/09/2017  . Debility 02/05/2017  . Ganglion upper arm, right   . Thrombosis of both upper extremities   . Suspected heparin induced thrombocytopenia (HIT) in hospitalized patient ([place])   . Gangrene of lower extremity ([place])   . Heparin induced thrombocytopenia (HIT) ([place])   . Tracheostomy status ([place])   . Chest tube in place   . Atherosclerosis of native arteries of extremities with gangrene, left leg ([place])   . Atherosclerosis of native arteries of extremities with gangrene, right leg ([place])   . Acute on chronic diastolic CHF (congestive heart failure) ([place])   . Enteritis due to Clostridium difficile   . Anasarca   . FUO (fever of unknown origin)   . Acute encephalopathy   . Elevated LFTs   . DIC (disseminated intravascular coagulation) ([place]) 10/28/2016  . Postoperative atrial fibrillation ([place]) 10/24/2016  . Cardiogenic shock ([place])   . Mitral regurgitation due to cusp prolapse 10/22/2016  . S/P aortic valve replacement with bioprosthetic valve 10/22/2016  . S/P  ascending aortic aneurysm repair 10/22/2016  . S/P mitral valve repair 10/22/2016  . S/P CABG x 3 10/22/2016  . Thrombocytopenia ([place]) 10/22/2016  . Acute combined systolic and diastolic congestive heart failure ([place]) 10/22/2016  . Acute respiratory failure ([place]) 10/22/2016  . Thoracic ascending aortic aneurysm ([place]) 10/21/2016  . Coronary artery disease involving native coronary artery of native heart with unstable angina pectoris ([place]) 10/20/2016  . Severe aortic stenosis by prior echocardiography 10/19/2016  . Unstable angina ([place]) 10/19/2016  . Aortic valve stenosis 10/19/2016  . Bicuspid aortic valve 10/19/2016  .  Trigger ring finger of right hand   . Wears glasses   . Gout   . Hypertension   . Carpal tunnel syndrome of right wrist   . Arthritis   . Snores     Quay Burow, OTR/L 06/07/2017, 1:05 PM  [facility] [address] [place], [place], [address] Phone: [phone]   Fax:  [phone]  Name: Scott [name] MRN: [id number] Date of Birth: 07-02-1946

## 2017-06-08 ENCOUNTER — Encounter: Payer: Self-pay | Admitting: Occupational Therapy

## 2017-06-08 ENCOUNTER — Ambulatory Visit: Payer: Medicare Other | Admitting: Occupational Therapy

## 2017-06-08 ENCOUNTER — Ambulatory Visit: Payer: Medicare Other | Admitting: Physical Therapy

## 2017-06-08 ENCOUNTER — Encounter: Payer: Self-pay | Admitting: Physical Therapy

## 2017-06-08 DIAGNOSIS — M79642 Pain in left hand: Secondary | ICD-10-CM | POA: Diagnosis not present

## 2017-06-08 DIAGNOSIS — M6281 Muscle weakness (generalized): Secondary | ICD-10-CM

## 2017-06-08 DIAGNOSIS — M79641 Pain in right hand: Secondary | ICD-10-CM

## 2017-06-08 DIAGNOSIS — R208 Other disturbances of skin sensation: Secondary | ICD-10-CM

## 2017-06-08 DIAGNOSIS — R2681 Unsteadiness on feet: Secondary | ICD-10-CM | POA: Diagnosis not present

## 2017-06-08 DIAGNOSIS — R2689 Other abnormalities of gait and mobility: Secondary | ICD-10-CM

## 2017-06-08 DIAGNOSIS — R29898 Other symptoms and signs involving the musculoskeletal system: Secondary | ICD-10-CM

## 2017-06-08 NOTE — Therapy (Signed)
[facility] [address] [place], [place], [address] Phone: [phone]   Fax:  [phone]  Occupational Therapy Treatment  Patient Details  Name: Scott [name] MRN: [id number] Date of Birth: 10-Apr-1946 Referring Provider: Dr. Meridee Score  Encounter Date: 06/08/2017      OT End of Session - 06/08/17 1744    Visit Number 6   Number of Visits 16   Date for OT Re-Evaluation 07/15/17   Authorization Type medicare pt will need G code and PN every 10th visit   Authorization Time Period 60 days   Authorization - Visit Number 6   Authorization - Number of Visits 10   OT Start Time 1093   OT Stop Time 1529   OT Time Calculation (min) 42 min   Activity Tolerance Patient tolerated treatment well      Past Medical History:  Diagnosis Date  . Arthritis    "hips, shoulders; knees; back" (10/20/2016)  . Bicuspid aortic valve   . BPH (benign prostatic hypertrophy)   . Carpal tunnel syndrome of right wrist   . Coronary artery disease involving native coronary artery of native heart with unstable angina pectoris ([place]) 10/20/2016  . Diverticulitis   . GERD (gastroesophageal reflux disease)   . Gout   . Heart murmur   . Hyperlipemia   . Hypertension   . Postoperative atrial fibrillation ([place]) 10/24/2016  . RLS (restless legs syndrome)   . S/P aortic valve replacement with bioprosthetic valve 10/22/2016   25 mm [facility] Ease bovine pericardial bioprosthetic tissue valve  . S/P ascending aortic aneurysm repair 10/22/2016   28 mm supracoronary straight graft replacement of ascending thoracic aortic aneurysm  . S/P CABG x 3 10/22/2016   Sequential LIMA to Diag and LAD, SVG to distal LAD, open vein harvest right thigh  . S/P mitral valve repair 10/22/2016   Artificial Gore-tex neochord placement x6 - posterior annuloplasty band placed but removed due to systolic anterior motion of mitral valve  . Severe aortic stenosis    . Snores    Never been tested for sleep apnea  . Thoracic ascending aortic aneurysm ([place]) 10/21/2016  . Thrombocytopenia ([place]) 10/22/2016  . Type II diabetes mellitus ([place])     Past Surgical History:  Procedure Laterality Date  . AMPUTATION Bilateral 12/11/2016   Procedure: AMPUTATION BELOW KNEE BILATERALLY;  Surgeon: [name], MD;  Location: [facility];  Service: Orthopedics;  Laterality: Bilateral;  . AMPUTATION Bilateral 12/11/2016   Procedure: AMPUTATION BILATERAL HANDS EXCEPT RIGHT THUMB;  Surgeon: [name], MD;  Location: [facility];  Service: Orthopedics;  Laterality: Bilateral;  . AORTIC VALVE REPLACEMENT N/A 10/22/2016   Procedure: AORTIC VALVE REPLACEMENT (AVR) WITH SIZE 25 MM MAGNA EASE PERICARDIAL BIOPROSTHESIS - AORTIC;  Surgeon: Rexene Alberts, MD;  Location: [facility];  Service: Open Heart Surgery;  Laterality: N/A;  . BONE EXCISION Right 02/01/2017   Procedure: EXCISION RIGHT INDEX METACARPAL HEAD;  Surgeon: [name], MD;  Location: [facility];  Service: Orthopedics;  Laterality: Right;  . CARDIAC CATHETERIZATION N/A 10/20/2016   Procedure: Right/Left Heart Cath and Coronary Angiography;  Surgeon: Leonie Man, MD;  Location: [facility];  Service: Cardiovascular;  Laterality: N/A;  . CARPAL TUNNEL RELEASE Right 11/28/2013   Procedure: RIGHT WRIST CARPAL TUNNEL RELEASE;  Surgeon: Lorn Junes, MD;  Location: [facility];  Service: Orthopedics;  Laterality: Right;  . CATARACT EXTRACTION W/ INTRAOCULAR LENS  IMPLANT, BILATERAL  Bilateral 1978  . COLONOSCOPY    . CORONARY ARTERY BYPASS GRAFT N/A 10/22/2016   Procedure: CORONARY ARTERY BYPASS GRAFTING (CABG)x 2 WITH LIMA TO DIAGONAL, OPEN  HARVESTING OF RIGHT SAPHENOUS VEIN FOR VEIN GRAFT TO LAD;  Surgeon: Rexene Alberts, MD;  Location: [facility];  Service: Open Heart Surgery;  Laterality: N/A;  . FRACTURE SURGERY    . INGUINAL HERNIA REPAIR Right 1998  . IR GENERIC HISTORICAL  12/07/2016   IR US GUIDE VASC  ACCESS RIGHT 12/07/2016 Aletta Edouard, MD [facility]  . IR GENERIC HISTORICAL  12/07/2016   IR RADIOLOGY PERIPHERAL GUIDED IV START 12/07/2016 Aletta Edouard, MD [facility]  . IR GENERIC HISTORICAL  12/07/2016   IR GASTROSTOMY TUBE MOD SED 12/07/2016 Aletta Edouard, MD [facility]  . LIPOMA EXCISION Right 2008   "side of my head"  . MITRAL VALVE REPAIR N/A 10/22/2016   Procedure: MITRAL VALVE REPAIR (MVR) WITH SIZE 30 SORIN ANNULOFLEX ANNULOPLASTY RING WITH SUBSEQUENT REMOVAL OF RING;  Surgeon: Rexene Alberts, MD;  Location: [facility];  Service: Open Heart Surgery;  Laterality: N/A;  . PENECTOMY  2007   Peyronie's disease   . PENILE PROSTHESIS IMPLANT  2009  . REMOVAL OF PENILE PROSTHESIS N/A 02/01/2017   Procedure: REMOVAL OF PENILE PROSTHESIS;  Surgeon: Kathie Rhodes, MD;  Location: [facility];  Service: Urology;  Laterality: N/A;  . SHOULDER OPEN ROTATOR CUFF REPAIR Right 2006  . STERNAL CLOSURE N/A 10/26/2016   Procedure: STERNAL WASHOUT AND DELAYED PRIMARY CLOSURE;  Surgeon: Rexene Alberts, MD;  Location: [facility];  Service: Thoracic;  Laterality: N/A;  . TEE WITHOUT CARDIOVERSION N/A 10/26/2016   Procedure: TRANSESOPHAGEAL ECHOCARDIOGRAM (TEE);  Surgeon: Rexene Alberts, MD;  Location: [facility];  Service: Thoracic;  Laterality: N/A;  . TEE WITHOUT CARDIOVERSION N/A 10/22/2016   Procedure: TRANSESOPHAGEAL ECHOCARDIOGRAM (TEE);  Surgeon: Rexene Alberts, MD;  Location: [facility];  Service: Open Heart Surgery;  Laterality: N/A;  . THORACIC AORTIC ANEURYSM REPAIR  10/22/2016   Procedure: ASCENDING AORTIC  ANEURYSM REPAIR (AAA) WITH 28 MM HEMASHIELD PLATINUM WOVEN DOUBLE VELOUR VASCULAR GRAFT;  Surgeon: Rexene Alberts, MD;  Location: [facility];  Service: Open Heart Surgery;;  . TONSILLECTOMY  ~ 1955  . TRACHEOSTOMY TUBE PLACEMENT N/A 11/09/2016   Procedure: TRACHEOSTOMY;  Surgeon: Rexene Alberts, MD;  Location: [facility];  Service: Thoracic;  Laterality: N/A;  . TRANSURETHRAL RESECTION OF PROSTATE  2005  .  TRIGGER FINGER RELEASE Bilateral    several lt and rt hands  . TRIGGER FINGER RELEASE Right 11/28/2013   Procedure: RIGHT TRIGGER FINGER  RELEASE (TENDON SHEATH INCISION);  Surgeon: Lorn Junes, MD;  Location: [facility];  Service: Orthopedics;  Laterality: Right;  Marland Kitchen VIDEO BRONCHOSCOPY N/A 11/09/2016   Procedure: VIDEO BRONCHOSCOPY;  Surgeon: Rexene Alberts, MD;  Location: [facility];  Service: Thoracic;  Laterality: N/A;  . WRIST FRACTURE SURGERY Right ~ 1959    There were no vitals filed for this visit.      Subjective Assessment - 06/08/17 1736    Subjective  I got my L prosthesis for my arm   Patient is accompained by: Family member  wife   Pertinent History see epic. Pt with Bil BKA as well as bilateral UE amputations of all fingers excepet L thumb at MCP joint due to gangrene.    Patient Stated Goals walk, drive a car and go where I want to go. Wife hopes pt can go  out to eat in a restaurant and use public bathroom and eat .    Currently in Pain? No/denies                      OT Treatments/Exercises (OP) - 06/08/17 0001      ADLs   ADL Comments Pt arrived today with LUE prosthesis. Addressed donning and doffing and pt able to complete with cues and min a.  Provided basic education on how to open and close hook as well as began discussion on work box for UE prosthesis.  Therapeutic activities to allow pt to begin to explore use of hook to pick up one inch blocks with hook.  Pt states he has h/o of arthritis and "I can feel this in my shoulder."  Discussed use of heat and ice, and pt to call MD to determine if he can use something else.  Pt quickly able to use movement to manuever hook for basic activities.                  OT Education - 06/07/17 1258    Education provided Yes   Education Details driving eval, reviewed tub bench transfers, other ADL issues   Person(s) Educated Patient;Spouse   Methods Explanation;Demonstration;Handout    Comprehension Verbalized understanding;Returned demonstration          OT Short Term Goals - 06/08/17 1743      OT SHORT TERM GOAL #1   Title PT and wife will be mod I with home activities program - 06/14/2017   Status On-going     OT SHORT TERM GOAL #2   Title Pt will be supervision for 3 in 1 commode transfers   Status Achieved     OT SHORT TERM GOAL #3   Title Pt will demonstrate ability to use opposition splint with prn with basic ADL tasks   Status Achieved     OT SHORT TERM GOAL #4   Title Pt will be able to complete simple cold snack prep with min a   Status On-going           OT Long Term Goals - 06/08/17 1743      OT LONG TERM GOAL #1   Title Pt will be mod I with home activities program - 07/12/2017   Status On-going     OT LONG TERM GOAL #2   Title Pt will be mod I with toilet transfers   Status On-going     OT LONG TERM GOAL #3   Title IF pt is able to complete stairs, pt will be mod I with tub bench transfers   Status On-going     OT LONG TERM GOAL #4   Title Pt will verbalize understanding of driving eval information   Status Achieved     OT LONG TERM GOAL #5   Title Pt will be mod I with toilet hygiene with AE   Status Achieved     OT LONG TERM GOAL #6   Title Pt will be mod I with buttoning, zipping using AE/prostheses   Status On-going     OT LONG TERM GOAL #7   Title Pt will be mod I with cutting food with AE/prostheses   Status On-going     OT LONG TERM GOAL #8   Title Pt will be able to write at 3 sentence level with 100% legibility with AE.    Status On-going  Plan - 06/08/17 1743    Clinical Impression Statement Pt progressing toward goals. Pt received LUE prosthesis yesterday   Rehab Potential Fair   OT Frequency 2x / week   OT Duration 8 weeks   OT Treatment/Interventions Self-care/ADL training;Moist Heat;Cryotherapy;Ultrasound;Fluidtherapy;DME and/or AE instruction;Neuromuscular education;Therapeutic  exercise;Therapist, nutritional;Therapeutic activities;Splinting;Patient/family education;Balance training   Plan check prosthesis, practice using prosthesis for functional tasks, focus on simple snack prep, check other STG's   Consulted and Agree with Plan of Care Patient;Family member/caregiver   Family Member Consulted wife      Patient will benefit from skilled therapeutic intervention in order to improve the following deficits and impairments:  Abnormal gait, Decreased activity tolerance, Decreased balance, Decreased mobility, Decreased knowledge of use of DME, Difficulty walking, Impaired UE functional use, Impaired sensation, Pain, Other (comment)  Visit Diagnosis: Other symptoms and signs involving the musculoskeletal system  Other disturbances of skin sensation  Pain in right hand  Pain in left hand    Problem List Patient Active Problem List   Diagnosis Date Noted  . Amputation of both hands with complication, subsequent encounter 03/01/2017  . Diabetes ([place]) 02/19/2017  . Status post bilateral below knee amputation ([place]) 02/09/2017  . Wound disruption, post-op, skin, sequela 02/09/2017  . Debility 02/05/2017  . Ganglion upper arm, right   . Thrombosis of both upper extremities   . Suspected heparin induced thrombocytopenia (HIT) in hospitalized patient ([place])   . Gangrene of lower extremity ([place])   . Heparin induced thrombocytopenia (HIT) ([place])   . Tracheostomy status ([place])   . Chest tube in place   . Atherosclerosis of native arteries of extremities with gangrene, left leg ([place])   . Atherosclerosis of native arteries of extremities with gangrene, right leg ([place])   . Acute on chronic diastolic CHF (congestive heart failure) ([place])   . Enteritis due to Clostridium difficile   . Anasarca   . FUO (fever of unknown origin)   . Acute encephalopathy   . Elevated LFTs   . DIC (disseminated intravascular coagulation) ([place]) 10/28/2016  . Postoperative atrial  fibrillation ([place]) 10/24/2016  . Cardiogenic shock ([place])   . Mitral regurgitation due to cusp prolapse 10/22/2016  . S/P aortic valve replacement with bioprosthetic valve 10/22/2016  . S/P ascending aortic aneurysm repair 10/22/2016  . S/P mitral valve repair 10/22/2016  . S/P CABG x 3 10/22/2016  . Thrombocytopenia ([place]) 10/22/2016  . Acute combined systolic and diastolic congestive heart failure ([place]) 10/22/2016  . Acute respiratory failure ([place]) 10/22/2016  . Thoracic ascending aortic aneurysm ([place]) 10/21/2016  . Coronary artery disease involving native coronary artery of native heart with unstable angina pectoris ([place]) 10/20/2016  . Severe aortic stenosis by prior echocardiography 10/19/2016  . Unstable angina ([place]) 10/19/2016  . Aortic valve stenosis 10/19/2016  . Bicuspid aortic valve 10/19/2016  . Trigger ring finger of right hand   . Wears glasses   . Gout   . Hypertension   . Carpal tunnel syndrome of right wrist   . Arthritis   . Snores     Quay Burow, OTR/L 06/08/2017, 5:45 PM  [facility] [address] [place], [place], [address] Phone: [phone]   Fax:  [phone]  Name: DENILSON SALMINEN MRN: [id number] Date of Birth: 1946/03/01

## 2017-06-08 NOTE — Therapy (Signed)
[facility] [address] [employer] [place], [place], [address] Phone: [phone]   Fax:  [phone]  Physical Therapy Treatment  Patient Details  Name: Scott [name] MRN: [id number] Date of Birth: 09/17/1946 Referring Provider: Meridee Score MD   Encounter Date: 06/08/2017      PT End of Session - 06/08/17 1450    Visit Number 13   Number of Visits 33   Date for PT Re-Evaluation 06/18/17   Authorization Type Medicare & G-codes every 10th visit    PT Start Time 1402   PT Stop Time 1440   PT Time Calculation (min) 38 min   Equipment Utilized During Treatment Gait belt   Activity Tolerance Patient tolerated treatment well   Behavior During Therapy [facility] for tasks assessed/performed      Past Medical History:  Diagnosis Date  . Arthritis    "hips, shoulders; knees; back" (10/20/2016)  . Bicuspid aortic valve   . BPH (benign prostatic hypertrophy)   . Carpal tunnel syndrome of right wrist   . Coronary artery disease involving native coronary artery of native heart with unstable angina pectoris ([place]) 10/20/2016  . Diverticulitis   . GERD (gastroesophageal reflux disease)   . Gout   . Heart murmur   . Hyperlipemia   . Hypertension   . Postoperative atrial fibrillation ([place]) 10/24/2016  . RLS (restless legs syndrome)   . S/P aortic valve replacement with bioprosthetic valve 10/22/2016   25 mm [facility] Ease bovine pericardial bioprosthetic tissue valve  . S/P ascending aortic aneurysm repair 10/22/2016   28 mm supracoronary straight graft replacement of ascending thoracic aortic aneurysm  . S/P CABG x 3 10/22/2016   Sequential LIMA to Diag and LAD, SVG to distal LAD, open vein harvest right thigh  . S/P mitral valve repair 10/22/2016   Artificial Gore-tex neochord placement x6 - posterior annuloplasty band placed but removed due to systolic anterior motion of mitral valve  . Severe aortic stenosis   . Snores    Never been tested for sleep apnea  . Thoracic ascending aortic aneurysm ([place]) 10/21/2016  . Thrombocytopenia ([place]) 10/22/2016  . Type II diabetes mellitus ([place])     Past Surgical History:  Procedure Laterality Date  . AMPUTATION Bilateral 12/11/2016   Procedure: AMPUTATION BELOW KNEE BILATERALLY;  Surgeon: [name], MD;  Location: [place];  Service: Orthopedics;  Laterality: Bilateral;  . AMPUTATION Bilateral 12/11/2016   Procedure: AMPUTATION BILATERAL HANDS EXCEPT RIGHT THUMB;  Surgeon: [name], MD;  Location: [place];  Service: Orthopedics;  Laterality: Bilateral;  . AORTIC VALVE REPLACEMENT N/A 10/22/2016   Procedure: AORTIC VALVE REPLACEMENT (AVR) WITH SIZE 25 MM MAGNA EASE PERICARDIAL BIOPROSTHESIS - AORTIC;  Surgeon: Rexene Alberts, MD;  Location: [place];  Service: Open Heart Surgery;  Laterality: N/A;  . BONE EXCISION Right 02/01/2017   Procedure: EXCISION RIGHT INDEX METACARPAL HEAD;  Surgeon: [name], MD;  Location: [facility];  Service: Orthopedics;  Laterality: Right;  . CARDIAC CATHETERIZATION N/A 10/20/2016   Procedure: Right/Left Heart Cath and Coronary Angiography;  Surgeon: Leonie Man, MD;  Location: [facility];  Service: Cardiovascular;  Laterality: N/A;  . CARPAL TUNNEL RELEASE Right 11/28/2013   Procedure: RIGHT WRIST CARPAL TUNNEL RELEASE;  Surgeon: Lorn Junes, MD;  Location: [facility];  Service: Orthopedics;  Laterality: Right;  . CATARACT EXTRACTION W/ INTRAOCULAR LENS  IMPLANT, BILATERAL Bilateral 1978  . COLONOSCOPY    .  CORONARY ARTERY BYPASS GRAFT N/A 10/22/2016   Procedure: CORONARY ARTERY BYPASS GRAFTING (CABG)x 2 WITH LIMA TO DIAGONAL, OPEN  HARVESTING OF RIGHT SAPHENOUS VEIN FOR VEIN GRAFT TO LAD;  Surgeon: Rexene Alberts, MD;  Location: [facility];  Service: Open Heart Surgery;  Laterality: N/A;  . FRACTURE SURGERY    . INGUINAL HERNIA REPAIR Right 1998  . IR GENERIC HISTORICAL  12/07/2016   IR US GUIDE VASC ACCESS RIGHT  12/07/2016 Aletta Edouard, MD [facility]  . IR GENERIC HISTORICAL  12/07/2016   IR RADIOLOGY PERIPHERAL GUIDED IV START 12/07/2016 Aletta Edouard, MD [facility]  . IR GENERIC HISTORICAL  12/07/2016   IR GASTROSTOMY TUBE MOD SED 12/07/2016 Aletta Edouard, MD [facility]  . LIPOMA EXCISION Right 2008   "side of my head"  . MITRAL VALVE REPAIR N/A 10/22/2016   Procedure: MITRAL VALVE REPAIR (MVR) WITH SIZE 30 SORIN ANNULOFLEX ANNULOPLASTY RING WITH SUBSEQUENT REMOVAL OF RING;  Surgeon: Rexene Alberts, MD;  Location: [place];  Service: Open Heart Surgery;  Laterality: N/A;  . PENECTOMY  2007   Peyronie's disease   . PENILE PROSTHESIS IMPLANT  2009  . REMOVAL OF PENILE PROSTHESIS N/A 02/01/2017   Procedure: REMOVAL OF PENILE PROSTHESIS;  Surgeon: Kathie Rhodes, MD;  Location: [facility];  Service: Urology;  Laterality: N/A;  . SHOULDER OPEN ROTATOR CUFF REPAIR Right 2006  . STERNAL CLOSURE N/A 10/26/2016   Procedure: STERNAL WASHOUT AND DELAYED PRIMARY CLOSURE;  Surgeon: Rexene Alberts, MD;  Location: [facility];  Service: Thoracic;  Laterality: N/A;  . TEE WITHOUT CARDIOVERSION N/A 10/26/2016   Procedure: TRANSESOPHAGEAL ECHOCARDIOGRAM (TEE);  Surgeon: Rexene Alberts, MD;  Location: [facility];  Service: Thoracic;  Laterality: N/A;  . TEE WITHOUT CARDIOVERSION N/A 10/22/2016   Procedure: TRANSESOPHAGEAL ECHOCARDIOGRAM (TEE);  Surgeon: Rexene Alberts, MD;  Location: [facility];  Service: Open Heart Surgery;  Laterality: N/A;  . THORACIC AORTIC ANEURYSM REPAIR  10/22/2016   Procedure: ASCENDING AORTIC  ANEURYSM REPAIR (AAA) WITH 28 MM HEMASHIELD PLATINUM WOVEN DOUBLE VELOUR VASCULAR GRAFT;  Surgeon: Rexene Alberts, MD;  Location: [facility];  Service: Open Heart Surgery;;  . TONSILLECTOMY  ~ 1955  . TRACHEOSTOMY TUBE PLACEMENT N/A 11/09/2016   Procedure: TRACHEOSTOMY;  Surgeon: Rexene Alberts, MD;  Location: [facility];  Service: Thoracic;  Laterality: N/A;  . TRANSURETHRAL RESECTION OF PROSTATE  2005  . TRIGGER FINGER  RELEASE Bilateral    several lt and rt hands  . TRIGGER FINGER RELEASE Right 11/28/2013   Procedure: RIGHT TRIGGER FINGER  RELEASE (TENDON SHEATH INCISION);  Surgeon: Lorn Junes, MD;  Location: [place];  Service: Orthopedics;  Laterality: Right;  Marland Kitchen VIDEO BRONCHOSCOPY N/A 11/09/2016   Procedure: VIDEO BRONCHOSCOPY;  Surgeon: Rexene Alberts, MD;  Location: [facility];  Service: Thoracic;  Laterality: N/A;  . WRIST FRACTURE SURGERY Right ~ 1959    There were no vitals filed for this visit.      Subjective Assessment - 06/08/17 1447    Subjective Patient reports no new falls or pain today. He brought in his new prosthetic arm to work with OT today.    Patient is accompained by: Family member   Pertinent History arthritis, bicuspid aortic valve, BPH, carpal tunnel syndrome in R wrist, CAD, diverticulitis, GERD, gout, hyperlipidemia, HTN, DM type II,thrombocytopenia, thoracic ascending aortic aneurysm, severe aortic stenosis, amputation of bilateral hands except right thumb (12/11/2016) due to gangrene, bilateral transtibial amputations (12/11/2016) due to gangrene,  R and L heart cath (10/20/2016)    Currently in Pain? No/denies                         [facility] Adult PT Treatment/Exercise - 06/08/17 1509      Ambulation/Gait   Ambulation/Gait Yes   Ambulation/Gait Assistance 3: Mod assist;4: Min assist;4: Min guard   Ambulation/Gait Assistance Details min guard to assist with gait using walker, gait without AD required modA +2 with UE support on the right side using PTA's hand and progressivley less UE on the left as patient gained confidence walking.    Ambulation Distance (Feet) 45 Feet  additional 95 ft without AD    Assistive device Rolling walker;2 person hand held assist;Prostheses   Ambulation Surface Level;Indoor     High Level Balance   High Level Balance Activities Marching forwards;Side stepping   High Level Balance Comments in parallel bars 4 laps,  with min guard with unilateral UE support on bars      Neuro Re-ed    Neuro Re-ed Details  static balance in parallel bars with unilateral arm reaching out to targets, alternating arm raises 15x, progressed to bilateral arm raises 10x with min guard to min assist; sitting on short stool, mini curls 2x10 with feet held down and guard standing behind, lateral leans alternating sides 10x each, trunk rotation alternating sides 10x each           Prosthetics   Prosthetic Care Comments  Use of cut-off socks distally for volume adjustment & proximally under liner for sweat management. Long incision scab gone with superficial opening 74mm at each end. Minor blood drainage in Tegaderm. PT replaced Tegaderm & recommended continued use. PT recommended switching liners / cleaning with bathing and donning other set after bath to get to bed.    Current prosthetic wear tolerance (days/week)  daily    Current prosthetic wear tolerance (#hours/day)  4 hours on, 2 hours off rotation throughout day  increased wear time to 5 hours on, 2 hours off for next week   Residual limb condition  left limb with 2 openings 39mm with minor blood drainage and lateral thigh single folliculitis from sweat probally   Education Provided Skin check;Residual limb care;Prosthetic cleaning;Proper wear schedule/adjustment;Proper Donning;Correct ply sock adjustment;Other (comment)   Person(s) Educated Patient;Spouse   Education Method Explanation;Demonstration;Verbal cues   Education Method Verbalized understanding            PT Short Term Goals - 06/07/17 1500      PT SHORT TERM GOAL #1   Title Patient will demonstrate ability to complete stairs with 1 rail on the L and supervision to indicate a decrease in risk of falling. (TARGET DATE: 06/18/2017)    Time 4   Period Weeks   Status On-going     PT SHORT TERM GOAL #2   Title Patient will demonstrate ability to ambulate 168ft with cane and min A (hand held assistance) to indicate  a decrease in risk of falling. (TARGET DATE: 06/18/2017)    Time 4   Period Weeks   Status On-going     PT SHORT TERM GOAL #3   Title Patient will demonstrate ability to perform sit <> stand transfer from armless chair with S to indicate improvement in functional mobility and a decrease in risk of falling. (TARGET DATE: 06/18/2017)    Time 4   Period Weeks   Status On-going     PT SHORT TERM GOAL #  4   Title Patient will demonstrate ability to reach anteriorly by 10 inches and to ankle level towards the ground with unilateral UE support without a loss of balance to indicate a decrease in his risk of falling. (TARGET DATE: 06/18/2017)    Time 4   Period Weeks   Status On-going           PT Long Term Goals - 04/28/17 1638      PT LONG TERM GOAL #1   Title Patient modified independently donnes & doffes, adjusts ply socks and pt / wife verbalize understanding of proper prosthetic care to ensure safe use of prostheses.  (Target Date: 08/20/2017)   Time 4   Period Months   Status On-going     PT LONG TERM GOAL #2   Title Patient will tolerate prostheses wear >90% of awake hours without skin issues or limb pain to demonstrate a decrease in his risk of falling. (Target Date: 08/20/2017)   Time 4   Period Months   Status On-going     PT LONG TERM GOAL #3   Title Patient performs standing balance activities with intermittent UE support reaching 10", picking up objects from floor and looks over shoulders with weight shift, trunk rotation with prostheses independently. (Target Date: 08/20/2017)     Time 4   Period Weeks   Status On-going     PT LONG TERM GOAL #4   Title Patient will ambulate 1000 feet including outdoor surfaces with LRAD and prostheses modified independently to enable community mobility.  (Target Date: 08/20/2017)   Time 4   Period Months   Status On-going     PT LONG TERM GOAL #5   Title Patient will negotiate ramp/curbs and stairs with LRAD and prostheses modified  independent for community access. (Target Date: 08/20/2017)   Time 4   Period Months   Status On-going     PT LONG TERM GOAL #6   Title Patient's gait velocity will be >/= 1.54ft/s to indicate a limited community ambulator. (Target Date: 08/20/2017)   Time 4   Period Weeks   Status On-going     PT LONG TERM GOAL #7   Title Patient reports Activities of Balance Confindence score using FOTO >25% to indicate greater confidence in his balance. (Target Date: 08/20/2017)   Time 4   Period Months   Status On-going               Plan - 06/08/17 1453    Clinical Impression Statement Today's session focused on dynamic gait, static balance, and gait without an AD. Patient tolerated balance exercises well using progressively less UE as he adjusted to exercise. Patient was told to start wearing his prosthetics 5hr a day twice a day and to monitor skin condition with the prolonged wear. Patient would benefit from continued PT to address residual concerns.    Clinical Impairments Affecting Rehab Potential arthritis, bicuspid aortic valve, BPH, carpal tunnel syndrome in R wrist, CAD, diverticulitis, GERD, gout, hyperlipidemia, HTN, DM type II,thrombocytopenia, thoracic ascending aortic aneurysm, severe aortic stenosis, amputation of bilateral hands except right thumb (12/11/2016) due to gangrene, bilateral transtibial amputations (12/11/2016) due to gangrene, R and L heart cath (10/20/2016)    PT Frequency 2x / week   PT Duration Other (comment)   PT Treatment/Interventions ADLs/Self Care Home Management;Neuromuscular re-education;Balance training;Therapeutic exercise;Therapeutic activities;Functional mobility training;Stair training;Gait training;DME Instruction;Patient/family education;Prosthetic Training   PT Next Visit Plan progress prosthetic gait training and barriers with RW; progress balance activities  including multi direction reaching;    Consulted and Agree with Plan of Care Patient;Family  member/caregiver   Family Member Consulted wife      Patient will benefit from skilled therapeutic intervention in order to improve the following deficits and impairments:  Abnormal gait, Decreased activity tolerance, Decreased balance, Decreased coordination, Decreased range of motion, Decreased mobility, Decreased knowledge of use of DME, Decreased knowledge of precautions, Decreased endurance, Decreased skin integrity, Decreased scar mobility, Decreased strength, Difficulty walking, Postural dysfunction, Prosthetic Dependency  Visit Diagnosis: Unsteadiness on feet  Other symptoms and signs involving the musculoskeletal system  Other disturbances of skin sensation  Muscle weakness (generalized)  Other abnormalities of gait and mobility     Problem List Patient Active Problem List   Diagnosis Date Noted  . Amputation of both hands with complication, subsequent encounter 03/01/2017  . Diabetes ([place]) 02/19/2017  . Status post bilateral below knee amputation ([place]) 02/09/2017  . Wound disruption, post-op, skin, sequela 02/09/2017  . Debility 02/05/2017  . Ganglion upper arm, right   . Thrombosis of both upper extremities   . Suspected heparin induced thrombocytopenia (HIT) in hospitalized patient ([place])   . Gangrene of lower extremity ([place])   . Heparin induced thrombocytopenia (HIT) ([name])   . Tracheostomy status ([place])   . Chest tube in place   . Atherosclerosis of native arteries of extremities with gangrene, left leg ([place])   . Atherosclerosis of native arteries of extremities with gangrene, right leg ([place])   . Acute on chronic diastolic CHF (congestive heart failure) ([place])   . Enteritis due to Clostridium difficile   . Anasarca   . FUO (fever of unknown origin)   . Acute encephalopathy   . Elevated LFTs   . DIC (disseminated intravascular coagulation) ([place]) 10/28/2016  . Postoperative atrial fibrillation ([place]) 10/24/2016  . Cardiogenic shock ([place])   . Mitral regurgitation  due to cusp prolapse 10/22/2016  . S/P aortic valve replacement with bioprosthetic valve 10/22/2016  . S/P ascending aortic aneurysm repair 10/22/2016  . S/P mitral valve repair 10/22/2016  . S/P CABG x 3 10/22/2016  . Thrombocytopenia ([place]) 10/22/2016  . Acute combined systolic and diastolic congestive heart failure ([place]) 10/22/2016  . Acute respiratory failure ([place]) 10/22/2016  . Thoracic ascending aortic aneurysm ([place]) 10/21/2016  . Coronary artery disease involving native coronary artery of native heart with unstable angina pectoris ([place]) 10/20/2016  . Severe aortic stenosis by prior echocardiography 10/19/2016  . Unstable angina ([place]) 10/19/2016  . Aortic valve stenosis 10/19/2016  . Bicuspid aortic valve 10/19/2016  . Trigger ring finger of right hand   . Wears glasses   . Gout   . Hypertension   . Carpal tunnel syndrome of right wrist   . Arthritis   . Snores     Skip Estimable, [place] 06/08/2017, 4:38 PM  [facility] [address] [place], [place], [address] Phone: [phone]   Fax:  [phone]  Name: Scott [name] MRN: [id number] Date of Birth: 08/24/1946

## 2017-06-09 ENCOUNTER — Encounter: Payer: Medicare Other | Admitting: Physical Therapy

## 2017-06-10 ENCOUNTER — Ambulatory Visit (INDEPENDENT_AMBULATORY_CARE_PROVIDER_SITE_OTHER): Payer: Medicare Other | Admitting: *Deleted

## 2017-06-10 DIAGNOSIS — I82603 Acute embolism and thrombosis of unspecified veins of upper extremity, bilateral: Secondary | ICD-10-CM | POA: Diagnosis not present

## 2017-06-10 DIAGNOSIS — I2510 Atherosclerotic heart disease of native coronary artery without angina pectoris: Secondary | ICD-10-CM

## 2017-06-10 DIAGNOSIS — Q231 Congenital insufficiency of aortic valve: Secondary | ICD-10-CM | POA: Diagnosis not present

## 2017-06-10 DIAGNOSIS — Z9889 Other specified postprocedural states: Secondary | ICD-10-CM

## 2017-06-10 DIAGNOSIS — I4891 Unspecified atrial fibrillation: Secondary | ICD-10-CM

## 2017-06-10 DIAGNOSIS — I9789 Other postprocedural complications and disorders of the circulatory system, not elsewhere classified: Secondary | ICD-10-CM

## 2017-06-10 LAB — POCT INR: INR: 2.8

## 2017-06-11 ENCOUNTER — Other Ambulatory Visit: Payer: Self-pay | Admitting: Cardiology

## 2017-06-14 ENCOUNTER — Ambulatory Visit: Payer: Medicare Other | Admitting: Physical Therapy

## 2017-06-14 ENCOUNTER — Encounter: Payer: Self-pay | Admitting: Physical Therapy

## 2017-06-14 DIAGNOSIS — R29898 Other symptoms and signs involving the musculoskeletal system: Secondary | ICD-10-CM | POA: Diagnosis not present

## 2017-06-14 DIAGNOSIS — R2681 Unsteadiness on feet: Secondary | ICD-10-CM | POA: Diagnosis not present

## 2017-06-14 DIAGNOSIS — M79642 Pain in left hand: Secondary | ICD-10-CM | POA: Diagnosis not present

## 2017-06-14 DIAGNOSIS — M79641 Pain in right hand: Secondary | ICD-10-CM | POA: Diagnosis not present

## 2017-06-14 DIAGNOSIS — M6281 Muscle weakness (generalized): Secondary | ICD-10-CM | POA: Diagnosis not present

## 2017-06-14 DIAGNOSIS — R2689 Other abnormalities of gait and mobility: Secondary | ICD-10-CM | POA: Diagnosis not present

## 2017-06-14 NOTE — Therapy (Signed)
[facility] [address] [place], [place], [address] Phone: [phone]   Fax:  [phone]  Physical Therapy Treatment  Patient Details  Name: Scott [name] MRN: [id number] Date of Birth: Nov 28, 1945 Referring Provider: Meridee Score MD   Encounter Date: 06/14/2017      PT End of Session - 06/14/17 1203    Visit Number 14   Number of Visits 33   Date for PT Re-Evaluation 06/18/17   Authorization Type Medicare & G-codes every 10th visit    PT Start Time 1105   PT Stop Time 1144   PT Time Calculation (min) 39 min   Equipment Utilized During Treatment Gait belt   Activity Tolerance Patient tolerated treatment well   Behavior During Therapy [facility] for tasks assessed/performed      Past Medical History:  Diagnosis Date  . Arthritis    "hips, shoulders; knees; back" (10/20/2016)  . Bicuspid aortic valve   . BPH (benign prostatic hypertrophy)   . Carpal tunnel syndrome of right wrist   . Coronary artery disease involving native coronary artery of native heart with unstable angina pectoris ([place]) 10/20/2016  . Diverticulitis   . GERD (gastroesophageal reflux disease)   . Gout   . Heart murmur   . Hyperlipemia   . Hypertension   . Postoperative atrial fibrillation ([place]) 10/24/2016  . RLS (restless legs syndrome)   . S/P aortic valve replacement with bioprosthetic valve 10/22/2016   25 mm [facility] Ease bovine pericardial bioprosthetic tissue valve  . S/P ascending aortic aneurysm repair 10/22/2016   28 mm supracoronary straight graft replacement of ascending thoracic aortic aneurysm  . S/P CABG x 3 10/22/2016   Sequential LIMA to Diag and LAD, SVG to distal LAD, open vein harvest right thigh  . S/P mitral valve repair 10/22/2016   Artificial Gore-tex neochord placement x6 - posterior annuloplasty band placed but removed due to systolic anterior motion of mitral valve  . Severe aortic stenosis   . Snores     Never been tested for sleep apnea  . Thoracic ascending aortic aneurysm ([place]) 10/21/2016  . Thrombocytopenia ([place]) 10/22/2016  . Type II diabetes mellitus ([place])     Past Surgical History:  Procedure Laterality Date  . AMPUTATION Bilateral 12/11/2016   Procedure: AMPUTATION BELOW KNEE BILATERALLY;  Surgeon: [name], MD;  Location: [place];  Service: Orthopedics;  Laterality: Bilateral;  . AMPUTATION Bilateral 12/11/2016   Procedure: AMPUTATION BILATERAL HANDS EXCEPT RIGHT THUMB;  Surgeon: [name], MD;  Location: [place];  Service: Orthopedics;  Laterality: Bilateral;  . AORTIC VALVE REPLACEMENT N/A 10/22/2016   Procedure: AORTIC VALVE REPLACEMENT (AVR) WITH SIZE 25 MM MAGNA EASE PERICARDIAL BIOPROSTHESIS - AORTIC;  Surgeon: Rexene Alberts, MD;  Location: [place];  Service: Open Heart Surgery;  Laterality: N/A;  . BONE EXCISION Right 02/01/2017   Procedure: EXCISION RIGHT INDEX METACARPAL HEAD;  Surgeon: [name], MD;  Location: [place];  Service: Orthopedics;  Laterality: Right;  . CARDIAC CATHETERIZATION N/A 10/20/2016   Procedure: Right/Left Heart Cath and Coronary Angiography;  Surgeon: Leonie Man, MD;  Location: [place] [facility];  Service: Cardiovascular;  Laterality: N/A;  . CARPAL TUNNEL RELEASE Right 11/28/2013   Procedure: RIGHT WRIST CARPAL TUNNEL RELEASE;  Surgeon: Lorn Junes, MD;  Location: [place];  Service: Orthopedics;  Laterality: Right;  . CATARACT EXTRACTION W/ INTRAOCULAR LENS  IMPLANT, BILATERAL Bilateral 1978  . COLONOSCOPY    .  CORONARY ARTERY BYPASS GRAFT N/A 10/22/2016   Procedure: CORONARY ARTERY BYPASS GRAFTING (CABG)x 2 WITH LIMA TO DIAGONAL, OPEN  HARVESTING OF RIGHT SAPHENOUS VEIN FOR VEIN GRAFT TO LAD;  Surgeon: Rexene Alberts, MD;  Location: [place];  Service: Open Heart Surgery;  Laterality: N/A;  . FRACTURE SURGERY    . INGUINAL HERNIA REPAIR Right 1998  . IR GENERIC HISTORICAL  12/07/2016   IR US GUIDE VASC ACCESS RIGHT  12/07/2016 Aletta Edouard, MD [facility]  . IR GENERIC HISTORICAL  12/07/2016   IR RADIOLOGY PERIPHERAL GUIDED IV START 12/07/2016 Aletta Edouard, MD [facility]  . IR GENERIC HISTORICAL  12/07/2016   IR GASTROSTOMY TUBE MOD SED 12/07/2016 Aletta Edouard, MD [facility]  . LIPOMA EXCISION Right 2008   "side of my head"  . MITRAL VALVE REPAIR N/A 10/22/2016   Procedure: MITRAL VALVE REPAIR (MVR) WITH SIZE 30 SORIN ANNULOFLEX ANNULOPLASTY RING WITH SUBSEQUENT REMOVAL OF RING;  Surgeon: Rexene Alberts, MD;  Location: [place];  Service: Open Heart Surgery;  Laterality: N/A;  . PENECTOMY  2007   Peyronie's disease   . PENILE PROSTHESIS IMPLANT  2009  . REMOVAL OF PENILE PROSTHESIS N/A 02/01/2017   Procedure: REMOVAL OF PENILE PROSTHESIS;  Surgeon: Kathie Rhodes, MD;  Location: [place];  Service: Urology;  Laterality: N/A;  . SHOULDER OPEN ROTATOR CUFF REPAIR Right 2006  . STERNAL CLOSURE N/A 10/26/2016   Procedure: STERNAL WASHOUT AND DELAYED PRIMARY CLOSURE;  Surgeon: Rexene Alberts, MD;  Location: [place];  Service: Thoracic;  Laterality: N/A;  . TEE WITHOUT CARDIOVERSION N/A 10/26/2016   Procedure: TRANSESOPHAGEAL ECHOCARDIOGRAM (TEE);  Surgeon: Rexene Alberts, MD;  Location: [place];  Service: Thoracic;  Laterality: N/A;  . TEE WITHOUT CARDIOVERSION N/A 10/22/2016   Procedure: TRANSESOPHAGEAL ECHOCARDIOGRAM (TEE);  Surgeon: Rexene Alberts, MD;  Location: [place];  Service: Open Heart Surgery;  Laterality: N/A;  . THORACIC AORTIC ANEURYSM REPAIR  10/22/2016   Procedure: ASCENDING AORTIC  ANEURYSM REPAIR (AAA) WITH 28 MM HEMASHIELD PLATINUM WOVEN DOUBLE VELOUR VASCULAR GRAFT;  Surgeon: Rexene Alberts, MD;  Location: [place];  Service: Open Heart Surgery;;  . TONSILLECTOMY  ~ 1955  . TRACHEOSTOMY TUBE PLACEMENT N/A 11/09/2016   Procedure: TRACHEOSTOMY;  Surgeon: Rexene Alberts, MD;  Location: [place];  Service: Thoracic;  Laterality: N/A;  . TRANSURETHRAL RESECTION OF PROSTATE  2005  . TRIGGER FINGER  RELEASE Bilateral    several lt and rt hands  . TRIGGER FINGER RELEASE Right 11/28/2013   Procedure: RIGHT TRIGGER FINGER  RELEASE (TENDON SHEATH INCISION);  Surgeon: Lorn Junes, MD;  Location: [place];  Service: Orthopedics;  Laterality: Right;  Marland Kitchen VIDEO BRONCHOSCOPY N/A 11/09/2016   Procedure: VIDEO BRONCHOSCOPY;  Surgeon: Rexene Alberts, MD;  Location: [facility] [place];  Service: Thoracic;  Laterality: N/A;  . WRIST FRACTURE SURGERY Right ~ 1959    There were no vitals filed for this visit.      Subjective Assessment - 06/14/17 1107    Subjective Patient reports a new sore on his right knee that he wanted [place] to look at. Over the weekend he went down to his basement for the first time since his amputations.    Patient is accompained by: Family member   Pertinent History arthritis, bicuspid aortic valve, BPH, carpal tunnel syndrome in R wrist, CAD, diverticulitis, GERD, gout, hyperlipidemia, HTN, DM type II,thrombocytopenia, thoracic ascending aortic aneurysm, severe aortic stenosis, amputation of bilateral hands except right  thumb (12/11/2016) due to gangrene, bilateral transtibial amputations (12/11/2016) due to gangrene, R and L heart cath (10/20/2016)    Limitations Standing;Walking;House hold activities   Patient Stated Goals use prostheses to walk including stairs, outdoors, target shooting, drive   Currently in Pain? No/denies             [facility] Adult PT Treatment/Exercise - 06/14/17 1122      Transfers   Transfers Sit to Stand;Stand to Sit   Sit to Stand 5: Supervision;4: Min guard;With upper extremity assist;With armrests;From chair/3-in-1  and without armrests   Sit to Stand Details Verbal cues for sequencing;Verbal cues for technique   Sit to Stand Details (indicate cue type and reason) verbal cues for hand placement to stand from chiar without armrest   Stand to Sit 5: Supervision;With upper extremity assist;With armrests;To chair/3-in-1     Ambulation/Gait    Ambulation/Gait Yes   Ambulation/Gait Assistance 4: Min guard;4: Min assist   Ambulation/Gait Assistance Details min guard to intermittent min assist with two people assisting, cues for longer steps and to keep cane closer to body   Ambulation Distance (Feet) 115 Feet  plus additional walking around gym using walker    Assistive device Straight cane;1 person hand held assist   Ambulation Surface Level;Indoor     Neuro Re-ed    Neuro Re-ed Details  static balance in parallel bars with alternating arm raises forward and lateral 15x, progressed to bilateral arm raises 10x with min guard; progressed to static balance 2x30sec on airex mat with intermittent UE support and min guard assist, alternating arm raises 15x with min assist for balance and cues to shift hips forward to maintain balance         Prosthetics   Prosthetic Care Comments  patient was instructed to increase sock ply to find a better fit and to take prosthesis off and dry his leg and liner throughout the day    Current prosthetic wear tolerance (#hours/day)  4-5hours on, 2 hours off rotation throughout day   Residual limb condition  right limb had blister  at tibial tuberosity that patient stated was getting worse, muitple other small pressure blisters on both left and right legs   Education Provided Skin check;Residual limb care;Prosthetic cleaning;Proper wear schedule/adjustment;Proper Donning;Correct ply sock adjustment;Other (comment)   Person(s) Educated Patient;Child(ren)   Education Method Explanation   Education Method Verbalized understanding            PT Education - 06/14/17 1202    Education provided Yes   Education Details reinforced heat and sweat managemnent, Richardson Landry from [place] advised patient to wear more socks to help relieve pressure points on legs (if that doesn't help make an appointment at [employer])   Person(s) Educated Patient;Child(ren)   Methods Explanation   Comprehension Verbalized understanding           PT Short Term Goals - 06/14/17 1214      PT SHORT TERM GOAL #1   Title Patient will demonstrate ability to complete stairs with 1 rail on the L and supervision to indicate a decrease in risk of falling. (TARGET DATE: 06/18/2017)    Time 4   Period Weeks   Status On-going     PT SHORT TERM GOAL #2   Title Patient will demonstrate ability to ambulate 128ft with cane and min A (hand held assistance) to indicate a decrease in risk of falling. (TARGET DATE: 06/18/2017)    Baseline 06/14/17: Patient ambulated ~159ft with cane and min guard  to min A (hand held assist)    Time 4   Period Weeks   Status Achieved     PT SHORT TERM GOAL #3   Title Patient will demonstrate ability to perform sit <> stand transfer from armless chair with S to indicate improvement in functional mobility and a decrease in risk of falling. (TARGET DATE: 06/18/2017)    Baseline 06/14/17: Patient able to progress to supervision for sit to stand from armless chair, needing verbal cueing for hand placement, prosthetic foot positioning and weight shifting    Time 4   Period Weeks   Status Achieved     PT SHORT TERM GOAL #4   Title Patient will demonstrate ability to reach anteriorly by 10 inches and to ankle level towards the ground with unilateral UE support without a loss of balance to indicate a decrease in his risk of falling. (TARGET DATE: 06/18/2017)    Baseline 06/14/17: Patient reached 11" anteriorly and to ankle level with right UE support on walker    Time 4   Period Weeks   Status Achieved           PT Long Term Goals - 04/28/17 1638      PT LONG TERM GOAL #1   Title Patient modified independently donnes & doffes, adjusts ply socks and pt / wife verbalize understanding of proper prosthetic care to ensure safe use of prostheses.  (Target Date: 08/20/2017)   Time 4   Period Months   Status On-going     PT LONG TERM GOAL #2   Title Patient will tolerate prostheses wear >90% of awake hours without  skin issues or limb pain to demonstrate a decrease in his risk of falling. (Target Date: 08/20/2017)   Time 4   Period Months   Status On-going     PT LONG TERM GOAL #3   Title Patient performs standing balance activities with intermittent UE support reaching 10", picking up objects from floor and looks over shoulders with weight shift, trunk rotation with prostheses independently. (Target Date: 08/20/2017)     Time 4   Period Weeks   Status On-going     PT LONG TERM GOAL #4   Title Patient will ambulate 1000 feet including outdoor surfaces with LRAD and prostheses modified independently to enable community mobility.  (Target Date: 08/20/2017)   Time 4   Period Months   Status On-going     PT LONG TERM GOAL #5   Title Patient will negotiate ramp/curbs and stairs with LRAD and prostheses modified independent for community access. (Target Date: 08/20/2017)   Time 4   Period Months   Status On-going     PT LONG TERM GOAL #6   Title Patient's gait velocity will be >/= 1.22ft/s to indicate a limited community ambulator. (Target Date: 08/20/2017)   Time 4   Period Weeks   Status On-going     PT LONG TERM GOAL #7   Title Patient reports Activities of Balance Confindence score using FOTO >25% to indicate greater confidence in his balance. (Target Date: 08/20/2017)   Time 4   Period Months   Status On-going               Plan - 06/14/17 1205    Clinical Impression Statement Today's session focused on assessing patient's short term goals and continued to work on balance in the parallel bars. We tested 3 out of 4 STGs today with the patient meeting all three. Patient was able to  walk ~176ft with min guard to intermiteent min assist and hand held assist. He was able to walk with no LOB and normalized his gait patten as he gained confidence. Paitent was able to meet goal 3 but needed moderate verbal cues to progress from min guard to supervision when standing from a chair with no armrests.  Patient was also instructed to increase sock ply to find a better fit to relieve pressure on his residual limb. If adding socks does not improve pressure points he is to see Richardson Landry at [facility]. Patient continues to make progress and would benefit from continued PT address unmet goals.     Rehab Potential Good   Clinical Impairments Affecting Rehab Potential arthritis, bicuspid aortic valve, BPH, carpal tunnel syndrome in R wrist, CAD, diverticulitis, GERD, gout, hyperlipidemia, HTN, DM type II,thrombocytopenia, thoracic ascending aortic aneurysm, severe aortic stenosis, amputation of bilateral hands except right thumb (12/11/2016) due to gangrene, bilateral transtibial amputations (12/11/2016) due to gangrene, R and L heart cath (10/20/2016)    PT Frequency 2x / week   PT Duration Other (comment)   PT Treatment/Interventions ADLs/Self Care Home Management;Neuromuscular re-education;Balance training;Therapeutic exercise;Therapeutic activities;Functional mobility training;Stair training;Gait training;DME Instruction;Patient/family education;Prosthetic Training   PT Next Visit Plan progress prosthetic gait training on level surfaces with cane or no AD with 2 person assist for safety and barriers with RW, progress balance activities including multi directional reaching;    Consulted and Agree with Plan of Care Patient;Family member/caregiver   Family Member Consulted daughter       Patient will benefit from skilled therapeutic intervention in order to improve the following deficits and impairments:  Abnormal gait, Decreased activity tolerance, Decreased balance, Decreased coordination, Decreased range of motion, Decreased mobility, Decreased knowledge of use of DME, Decreased knowledge of precautions, Decreased endurance, Decreased skin integrity, Decreased scar mobility, Decreased strength, Difficulty walking, Postural dysfunction, Prosthetic Dependency  Visit Diagnosis: Unsteadiness on feet  Other symptoms  and signs involving the musculoskeletal system  Muscle weakness (generalized)  Other abnormalities of gait and mobility     Problem List Patient Active Problem List   Diagnosis Date Noted  . Amputation of both hands with complication, subsequent encounter 03/01/2017  . Diabetes ([place]) 02/19/2017  . Status post bilateral below knee amputation ([place]) 02/09/2017  . Wound disruption, post-op, skin, sequela 02/09/2017  . Debility 02/05/2017  . Ganglion upper arm, right   . Thrombosis of both upper extremities   . Suspected heparin induced thrombocytopenia (HIT) in hospitalized patient ([place])   . Gangrene of lower extremity ([place])   . Heparin induced thrombocytopenia (HIT) ([place])   . Tracheostomy status ([place])   . Chest tube in place   . Atherosclerosis of native arteries of extremities with gangrene, left leg ([place])   . Atherosclerosis of native arteries of extremities with gangrene, right leg ([place])   . Acute on chronic diastolic CHF (congestive heart failure) ([place])   . Enteritis due to Clostridium difficile   . Anasarca   . FUO (fever of unknown origin)   . Acute encephalopathy   . Elevated LFTs   . DIC (disseminated intravascular coagulation) ([place]) 10/28/2016  . Postoperative atrial fibrillation ([place]) 10/24/2016  . Cardiogenic shock ([place])   . Mitral regurgitation due to cusp prolapse 10/22/2016  . S/P aortic valve replacement with bioprosthetic valve 10/22/2016  . S/P ascending aortic aneurysm repair 10/22/2016  . S/P mitral valve repair 10/22/2016  . S/P CABG x 3 10/22/2016  . Thrombocytopenia ([place]) 10/22/2016  . Acute combined systolic  and diastolic congestive heart failure ([place]) 10/22/2016  . Acute respiratory failure ([place]) 10/22/2016  . Thoracic ascending aortic aneurysm ([place]) 10/21/2016  . Coronary artery disease involving native coronary artery of native heart with unstable angina pectoris ([place]) 10/20/2016  . Severe aortic stenosis by prior echocardiography 10/19/2016  .  Unstable angina ([place]) 10/19/2016  . Aortic valve stenosis 10/19/2016  . Bicuspid aortic valve 10/19/2016  . Trigger ring finger of right hand   . Wears glasses   . Gout   . Hypertension   . Carpal tunnel syndrome of right wrist   . Arthritis   . Snores     Skip Estimable, SPTA 06/14/2017, 12:28 PM  [facility] [address] [place], [place], [address] Phone: [phone]   Fax:  [phone]  Name: Scott [name] MRN: [id number] Date of Birth: 12-11-1945  This note has been reviewed and edited by supervising CI.  Willow Ora, PTA, [facility] [address], [place], [place] [address] [phone] 06/14/17, 1:50 PM

## 2017-06-15 ENCOUNTER — Telehealth: Payer: Self-pay | Admitting: Physical Medicine and Rehabilitation

## 2017-06-15 MED ORDER — BASAGLAR KWIKPEN 100 UNIT/ML ~~LOC~~ SOPN
5.0000 [IU] | PEN_INJECTOR | Freq: Two times a day (BID) | SUBCUTANEOUS | 1 refills | Status: DC
Start: 2017-06-15 — End: 2017-07-01

## 2017-06-15 MED ORDER — BASAGLAR KWIKPEN 100 UNIT/ML ~~LOC~~ SOPN: 5.0000 [IU] | PEN_INJECTOR | Freq: Two times a day (BID) | SUBCUTANEOUS | 1 refills | Status: DC

## 2017-06-16 ENCOUNTER — Encounter: Payer: Self-pay | Admitting: Physical Therapy

## 2017-06-16 ENCOUNTER — Ambulatory Visit: Payer: Medicare Other | Admitting: Physical Therapy

## 2017-06-16 DIAGNOSIS — R2689 Other abnormalities of gait and mobility: Secondary | ICD-10-CM | POA: Diagnosis not present

## 2017-06-16 DIAGNOSIS — M6281 Muscle weakness (generalized): Secondary | ICD-10-CM | POA: Diagnosis not present

## 2017-06-16 DIAGNOSIS — M79641 Pain in right hand: Secondary | ICD-10-CM | POA: Diagnosis not present

## 2017-06-16 DIAGNOSIS — R2681 Unsteadiness on feet: Secondary | ICD-10-CM

## 2017-06-16 DIAGNOSIS — R29898 Other symptoms and signs involving the musculoskeletal system: Secondary | ICD-10-CM | POA: Diagnosis not present

## 2017-06-16 DIAGNOSIS — M79642 Pain in left hand: Secondary | ICD-10-CM | POA: Diagnosis not present

## 2017-06-16 NOTE — Therapy (Signed)
[facility] [address] [place], [place], [address] Phone: [phone]   Fax:  [phone]  Physical Therapy Treatment  Patient Details  Name: Scott [name] MRN: [id number] Date of Birth: March 15, 1946 Referring Provider: Meridee Score MD   Encounter Date: 06/16/2017      PT End of Session - 06/16/17 1151    Visit Number 15   Number of Visits 33   Date for PT Re-Evaluation 06/18/17   Authorization Type Medicare & G-codes every 10th visit    PT Start Time 1102   PT Stop Time 1144   PT Time Calculation (min) 42 min   Equipment Utilized During Treatment Gait belt   Activity Tolerance Patient tolerated treatment well   Behavior During Therapy [name] for tasks assessed/performed      Past Medical History:  Diagnosis Date  . Arthritis    "hips, shoulders; knees; back" (10/20/2016)  . Bicuspid aortic valve   . BPH (benign prostatic hypertrophy)   . Carpal tunnel syndrome of right wrist   . Coronary artery disease involving native coronary artery of native heart with unstable angina pectoris ([place]) 10/20/2016  . Diverticulitis   . GERD (gastroesophageal reflux disease)   . Gout   . Heart murmur   . Hyperlipemia   . Hypertension   . Postoperative atrial fibrillation ([place]) 10/24/2016  . RLS (restless legs syndrome)   . S/P aortic valve replacement with bioprosthetic valve 10/22/2016   25 mm [facility] Ease bovine pericardial bioprosthetic tissue valve  . S/P ascending aortic aneurysm repair 10/22/2016   28 mm supracoronary straight graft replacement of ascending thoracic aortic aneurysm  . S/P CABG x 3 10/22/2016   Sequential LIMA to Diag and LAD, SVG to distal LAD, open vein harvest right thigh  . S/P mitral valve repair 10/22/2016   Artificial Gore-tex neochord placement x6 - posterior annuloplasty band placed but removed due to systolic anterior motion of mitral valve  . Severe aortic stenosis   . Snores    Never been tested for sleep apnea  . Thoracic ascending aortic aneurysm ([place]) 10/21/2016  . Thrombocytopenia ([place]) 10/22/2016  . Type II diabetes mellitus ([place])     Past Surgical History:  Procedure Laterality Date  . AMPUTATION Bilateral 12/11/2016   Procedure: AMPUTATION BELOW KNEE BILATERALLY;  Surgeon: [name], MD;  Location: [place];  Service: Orthopedics;  Laterality: Bilateral;  . AMPUTATION Bilateral 12/11/2016   Procedure: AMPUTATION BILATERAL HANDS EXCEPT RIGHT THUMB;  Surgeon: [name], MD;  Location: [place];  Service: Orthopedics;  Laterality: Bilateral;  . AORTIC VALVE REPLACEMENT N/A 10/22/2016   Procedure: AORTIC VALVE REPLACEMENT (AVR) WITH SIZE 25 MM MAGNA EASE PERICARDIAL BIOPROSTHESIS - AORTIC;  Surgeon: Rexene Alberts, MD;  Location: [place];  Service: Open Heart Surgery;  Laterality: N/A;  . BONE EXCISION Right 02/01/2017   Procedure: EXCISION RIGHT INDEX METACARPAL HEAD;  Surgeon: [name], MD;  Location: [place];  Service: Orthopedics;  Laterality: Right;  . CARDIAC CATHETERIZATION N/A 10/20/2016   Procedure: Right/Left Heart Cath and Coronary Angiography;  Surgeon: Leonie Man, MD;  Location: [facility];  Service: Cardiovascular;  Laterality: N/A;  . CARPAL TUNNEL RELEASE Right 11/28/2013   Procedure: RIGHT WRIST CARPAL TUNNEL RELEASE;  Surgeon: Lorn Junes, MD;  Location: [facility];  Service: Orthopedics;  Laterality: Right;  . CATARACT EXTRACTION W/ INTRAOCULAR LENS  IMPLANT, BILATERAL Bilateral 1978  . COLONOSCOPY    .  CORONARY ARTERY BYPASS GRAFT N/A 10/22/2016   Procedure: CORONARY ARTERY BYPASS GRAFTING (CABG)x 2 WITH LIMA TO DIAGONAL, OPEN  HARVESTING OF RIGHT SAPHENOUS VEIN FOR VEIN GRAFT TO LAD;  Surgeon: Rexene Alberts, MD;  Location: [facility];  Service: Open Heart Surgery;  Laterality: N/A;  . FRACTURE SURGERY    . INGUINAL HERNIA REPAIR Right 1998  . IR GENERIC HISTORICAL  12/07/2016   IR US GUIDE VASC ACCESS RIGHT  12/07/2016 Aletta Edouard, MD [facility]  . IR GENERIC HISTORICAL  12/07/2016   IR RADIOLOGY PERIPHERAL GUIDED IV START 12/07/2016 Aletta Edouard, MD [facility]  . IR GENERIC HISTORICAL  12/07/2016   IR GASTROSTOMY TUBE MOD SED 12/07/2016 Aletta Edouard, MD [facility]  . LIPOMA EXCISION Right 2008   "side of my head"  . MITRAL VALVE REPAIR N/A 10/22/2016   Procedure: MITRAL VALVE REPAIR (MVR) WITH SIZE 30 SORIN ANNULOFLEX ANNULOPLASTY RING WITH SUBSEQUENT REMOVAL OF RING;  Surgeon: Rexene Alberts, MD;  Location: [place] [facility];  Service: Open Heart Surgery;  Laterality: N/A;  . PENECTOMY  2007   Peyronie's disease   . PENILE PROSTHESIS IMPLANT  2009  . REMOVAL OF PENILE PROSTHESIS N/A 02/01/2017   Procedure: REMOVAL OF PENILE PROSTHESIS;  Surgeon: Kathie Rhodes, MD;  Location: [facility];  Service: Urology;  Laterality: N/A;  . SHOULDER OPEN ROTATOR CUFF REPAIR Right 2006  . STERNAL CLOSURE N/A 10/26/2016   Procedure: STERNAL WASHOUT AND DELAYED PRIMARY CLOSURE;  Surgeon: Rexene Alberts, MD;  Location: [facility];  Service: Thoracic;  Laterality: N/A;  . TEE WITHOUT CARDIOVERSION N/A 10/26/2016   Procedure: TRANSESOPHAGEAL ECHOCARDIOGRAM (TEE);  Surgeon: Rexene Alberts, MD;  Location: [facility];  Service: Thoracic;  Laterality: N/A;  . TEE WITHOUT CARDIOVERSION N/A 10/22/2016   Procedure: TRANSESOPHAGEAL ECHOCARDIOGRAM (TEE);  Surgeon: Rexene Alberts, MD;  Location: [facility];  Service: Open Heart Surgery;  Laterality: N/A;  . THORACIC AORTIC ANEURYSM REPAIR  10/22/2016   Procedure: ASCENDING AORTIC  ANEURYSM REPAIR (AAA) WITH 28 MM HEMASHIELD PLATINUM WOVEN DOUBLE VELOUR VASCULAR GRAFT;  Surgeon: Rexene Alberts, MD;  Location: [place] [facility];  Service: Open Heart Surgery;;  . TONSILLECTOMY  ~ 1955  . TRACHEOSTOMY TUBE PLACEMENT N/A 11/09/2016   Procedure: TRACHEOSTOMY;  Surgeon: Rexene Alberts, MD;  Location: [facility] [place];  Service: Thoracic;  Laterality: N/A;  . TRANSURETHRAL RESECTION OF PROSTATE  2005  . TRIGGER FINGER  RELEASE Bilateral    several lt and rt hands  . TRIGGER FINGER RELEASE Right 11/28/2013   Procedure: RIGHT TRIGGER FINGER  RELEASE (TENDON SHEATH INCISION);  Surgeon: Lorn Junes, MD;  Location: [facility];  Service: Orthopedics;  Laterality: Right;  Marland Kitchen VIDEO BRONCHOSCOPY N/A 11/09/2016   Procedure: VIDEO BRONCHOSCOPY;  Surgeon: Rexene Alberts, MD;  Location: [facility] [place];  Service: Thoracic;  Laterality: N/A;  . WRIST FRACTURE SURGERY Right ~ 1959    There were no vitals filed for this visit.      Subjective Assessment - 06/16/17 1102    Subjective Patient denies any new falls or pain. He stated that the sore on his R residual limb is getting better.    Patient Stated Goals use prostheses to walk including stairs, outdoors, target shooting, drive   Currently in Pain? No/denies             [facility] Adult PT Treatment/Exercise - 06/16/17 1157      Ambulation/Gait   Ambulation/Gait Yes   Ambulation/Gait Assistance 4: Min guard;5:  Supervision   Ambulation/Gait Assistance Details min guard when in parallel bars without AD   Assistive device Rolling walker;Parallel bars;Prostheses   Ambulation Surface Level;Indoor   Stairs Yes   Stairs Assistance 5: Supervision   Stairs Assistance Details (indicate cue type and reason) cues for proper form when desceding the stairs   Stair Management Technique One rail Left;One rail Right;Sideways;Step to pattern   Number of Stairs 4   Height of Stairs 6   Gait Comments blocked practice walking with one UE support in parallel bars, min guard with cues for step length and weight shifting, started with bilateral UE support then progressed to single UE support emphasizing proper gait sequence with reciprocal arm movements      High Level Balance   High Level Balance Activities Side stepping;Backward walking;Marching forwards;Negotiating over obstacles   High Level Balance Comments in parallel bars, used black foam strips as obstacles for  patient to step over, several small stumbles when patient did not lift foot high enough to clear obstacle or when patient went to fast over obstacle and lost his balance     Neuro Re-ed    Neuro Re-ed Details  static balance in parallel bars 2x30sec, progressed with alternating arm raises and trunk rotation, min guard with intermittent UE support on bars; forward step ups onto arirex mat, several sets of stepping onto and then off facing forward and several sets with stepping onto airex then back of backwards, min guard; added static balance on airex before stepping down, 30sec with min guard to min assist      Prosthetics   Prosthetic Care Comments  small wound on right leg looked to be healing with no sign of infection, increased sock ply has helped according to patient    Education Provided Skin check;Residual limb care;Prosthetic cleaning;Proper wear schedule/adjustment;Proper Donning;Correct ply sock adjustment;Other (comment)   Person(s) Educated Patient;Child(ren)   Education Method Explanation   Education Method Verbalized understanding                  PT Short Term Goals - 06/16/17 1224      PT SHORT TERM GOAL #1   Title Patient will demonstrate ability to complete stairs with 1 rail on the L and supervision to indicate a decrease in risk of falling. (TARGET DATE: 06/18/2017)    Baseline 06/16/17: completed stairs with 1 rail on left needing minimal cues for proper sequence    Time 4   Period Weeks   Status Achieved     PT SHORT TERM GOAL #2   Title Patient will demonstrate ability to ambulate 172ft with cane and min A (hand held assistance) to indicate a decrease in risk of falling. (TARGET DATE: 06/18/2017)    Baseline 06/14/17: Patient ambulated ~164ft with cane and min guard to min A (hand held assist)    Time 4   Period Weeks   Status Achieved     PT SHORT TERM GOAL #3   Title Patient will demonstrate ability to perform sit <> stand transfer from armless chair with  S to indicate improvement in functional mobility and a decrease in risk of falling. (TARGET DATE: 06/18/2017)    Baseline 06/14/17: Patient able to progress to supervision for sit to stand from armless chair, needing verbal cueing for hand placement, prosthetic foot positioning and weight shifting    Time 4   Period Weeks   Status Achieved     PT SHORT TERM GOAL #4   Title Patient will demonstrate  ability to reach anteriorly by 10 inches and to ankle level towards the ground with unilateral UE support without a loss of balance to indicate a decrease in his risk of falling. (TARGET DATE: 06/18/2017)    Baseline 06/14/17: Patient reached 11" anteriorly and to ankle level with right UE support on walker    Time 4   Period Weeks   Status Achieved           PT Long Term Goals - 04/28/17 1638      PT LONG TERM GOAL #1   Title Patient modified independently donnes & doffes, adjusts ply socks and pt / wife verbalize understanding of proper prosthetic care to ensure safe use of prostheses.  (Target Date: 08/20/2017)   Time 4   Period Months   Status On-going     PT LONG TERM GOAL #2   Title Patient will tolerate prostheses wear >90% of awake hours without skin issues or limb pain to demonstrate a decrease in his risk of falling. (Target Date: 08/20/2017)   Time 4   Period Months   Status On-going     PT LONG TERM GOAL #3   Title Patient performs standing balance activities with intermittent UE support reaching 10", picking up objects from floor and looks over shoulders with weight shift, trunk rotation with prostheses independently. (Target Date: 08/20/2017)     Time 4   Period Weeks   Status On-going     PT LONG TERM GOAL #4   Title Patient will ambulate 1000 feet including outdoor surfaces with LRAD and prostheses modified independently to enable community mobility.  (Target Date: 08/20/2017)   Time 4   Period Months   Status On-going     PT LONG TERM GOAL #5   Title Patient will  negotiate ramp/curbs and stairs with LRAD and prostheses modified independent for community access. (Target Date: 08/20/2017)   Time 4   Period Months   Status On-going     PT LONG TERM GOAL #6   Title Patient's gait velocity will be >/= 1.35ft/s to indicate a limited community ambulator. (Target Date: 08/20/2017)   Time 4   Period Weeks   Status On-going     PT LONG TERM GOAL #7   Title Patient reports Activities of Balance Confindence score using FOTO >25% to indicate greater confidence in his balance. (Target Date: 08/20/2017)   Time 4   Period Months   Status On-going               Plan - 06/16/17 1214    Clinical Impression Statement Today's session focused on assessing patient's last STG on the stairs and balance training in the parallel bars. Patient was able to walk up and down the stairs sideways using one hand rail with supervision from PTA. Patient continues to find compliant surface balance activites challenging but shows progression with being able to regain balance with less assistance. He would benefit from continued PT to address unmet goals.      Rehab Potential Good   Clinical Impairments Affecting Rehab Potential arthritis, bicuspid aortic valve, BPH, carpal tunnel syndrome in R wrist, CAD, diverticulitis, GERD, gout, hyperlipidemia, HTN, DM type II,thrombocytopenia, thoracic ascending aortic aneurysm, severe aortic stenosis, amputation of bilateral hands except right thumb (12/11/2016) due to gangrene, bilateral transtibial amputations (12/11/2016) due to gangrene, R and L heart cath (10/20/2016)    PT Frequency 2x / week   PT Duration Other (comment)   PT Treatment/Interventions ADLs/Self Care Home Management;Neuromuscular re-education;Balance  training;Therapeutic exercise;Therapeutic activities;Functional mobility training;Stair training;Gait training;DME Instruction;Patient/family education;Prosthetic Training   PT Next Visit Plan progress prosthetic gait training  and barriers with RW; gait training uisng a SPC, progress balance activities including multi direction reaching;    Consulted and Agree with Plan of Care Patient;Family member/caregiver   Family Member Consulted daughter       Patient will benefit from skilled therapeutic intervention in order to improve the following deficits and impairments:  Abnormal gait, Decreased activity tolerance, Decreased balance, Decreased coordination, Decreased range of motion, Decreased mobility, Decreased knowledge of use of DME, Decreased knowledge of precautions, Decreased endurance, Decreased skin integrity, Decreased scar mobility, Decreased strength, Difficulty walking, Postural dysfunction, Prosthetic Dependency  Visit Diagnosis: Unsteadiness on feet  Other symptoms and signs involving the musculoskeletal system  Muscle weakness (generalized)  Other abnormalities of gait and mobility     Problem List Patient Active Problem List   Diagnosis Date Noted  . Amputation of both hands with complication, subsequent encounter 03/01/2017  . Diabetes ([place]) 02/19/2017  . Status post bilateral below knee amputation ([place]) 02/09/2017  . Wound disruption, post-op, skin, sequela 02/09/2017  . Debility 02/05/2017  . Ganglion upper arm, right   . Thrombosis of both upper extremities   . Suspected heparin induced thrombocytopenia (HIT) in hospitalized patient ([place])   . Gangrene of lower extremity ([place])   . Heparin induced thrombocytopenia (HIT) ([place])   . Tracheostomy status ([place])   . Chest tube in place   . Atherosclerosis of native arteries of extremities with gangrene, left leg ([place])   . Atherosclerosis of native arteries of extremities with gangrene, right leg ([place])   . Acute on chronic diastolic CHF (congestive heart failure) ([place])   . Enteritis due to Clostridium difficile   . Anasarca   . FUO (fever of unknown origin)   . Acute encephalopathy   . Elevated LFTs   . DIC (disseminated intravascular  coagulation) ([place]) 10/28/2016  . Postoperative atrial fibrillation ([place]) 10/24/2016  . Cardiogenic shock ([place])   . Mitral regurgitation due to cusp prolapse 10/22/2016  . S/P aortic valve replacement with bioprosthetic valve 10/22/2016  . S/P ascending aortic aneurysm repair 10/22/2016  . S/P mitral valve repair 10/22/2016  . S/P CABG x 3 10/22/2016  . Thrombocytopenia ([place]) 10/22/2016  . Acute combined systolic and diastolic congestive heart failure ([place]) 10/22/2016  . Acute respiratory failure ([place]) 10/22/2016  . Thoracic ascending aortic aneurysm ([place]) 10/21/2016  . Coronary artery disease involving native coronary artery of native heart with unstable angina pectoris ([place]) 10/20/2016  . Severe aortic stenosis by prior echocardiography 10/19/2016  . Unstable angina ([place]) 10/19/2016  . Aortic valve stenosis 10/19/2016  . Bicuspid aortic valve 10/19/2016  . Trigger ring finger of right hand   . Wears glasses   . Gout   . Hypertension   . Carpal tunnel syndrome of right wrist   . Arthritis   . Snores     Skip Estimable , SPTA 06/16/2017, 2:46 PM  [facility] [address] [place], [place], [address] Phone: [phone]   Fax:  [phone]  Name: Scott [name] MRN: [id number] Date of Birth: 1946/07/03

## 2017-06-21 ENCOUNTER — Encounter: Payer: Self-pay | Admitting: Physical Therapy

## 2017-06-21 ENCOUNTER — Ambulatory Visit: Payer: Medicare Other | Admitting: Physical Therapy

## 2017-06-21 ENCOUNTER — Encounter: Payer: Self-pay | Admitting: Occupational Therapy

## 2017-06-21 ENCOUNTER — Ambulatory Visit: Payer: Medicare Other | Admitting: Occupational Therapy

## 2017-06-21 DIAGNOSIS — R29898 Other symptoms and signs involving the musculoskeletal system: Secondary | ICD-10-CM | POA: Diagnosis not present

## 2017-06-21 DIAGNOSIS — M79642 Pain in left hand: Secondary | ICD-10-CM | POA: Diagnosis not present

## 2017-06-21 DIAGNOSIS — R2681 Unsteadiness on feet: Secondary | ICD-10-CM | POA: Diagnosis not present

## 2017-06-21 DIAGNOSIS — M6281 Muscle weakness (generalized): Secondary | ICD-10-CM | POA: Diagnosis not present

## 2017-06-21 DIAGNOSIS — R2689 Other abnormalities of gait and mobility: Secondary | ICD-10-CM | POA: Diagnosis not present

## 2017-06-21 DIAGNOSIS — R208 Other disturbances of skin sensation: Secondary | ICD-10-CM

## 2017-06-21 DIAGNOSIS — M79641 Pain in right hand: Secondary | ICD-10-CM

## 2017-06-21 DIAGNOSIS — R293 Abnormal posture: Secondary | ICD-10-CM

## 2017-06-21 NOTE — Therapy (Signed)
[facility] [address] [place], [place], [address] Phone: [phone]   Fax:  [phone]  Occupational Therapy Treatment  Patient Details  Name: Scott [name] MRN: [id number] Date of Birth: [birth year] Referring Provider: Dr. Meridee Score  Encounter Date: 06/21/2017      OT End of Session - 06/21/17 1253    Visit Number 7   Number of Visits 16   Date for OT Re-Evaluation 07/15/17   Authorization Type medicare pt will need G code and PN every 10th visit   Authorization Time Period 60 days   Authorization - Visit Number 7   Authorization - Number of Visits 10   OT Start Time 1101   OT Stop Time 1148   OT Time Calculation (min) 47 min   Activity Tolerance Patient tolerated treatment well      Past Medical History:  Diagnosis Date  . Arthritis    "hips, shoulders; knees; back" (10/20/2016)  . Bicuspid aortic valve   . BPH (benign prostatic hypertrophy)   . Carpal tunnel syndrome of right wrist   . Coronary artery disease involving native coronary artery of native heart with unstable angina pectoris ([place]) 10/20/2016  . Diverticulitis   . GERD (gastroesophageal reflux disease)   . Gout   . Heart murmur   . Hyperlipemia   . Hypertension   . Postoperative atrial fibrillation ([place]) 10/24/2016  . RLS (restless legs syndrome)   . S/P aortic valve replacement with bioprosthetic valve 10/22/2016   25 mm [facility] Ease bovine pericardial bioprosthetic tissue valve  . S/P ascending aortic aneurysm repair 10/22/2016   28 mm supracoronary straight graft replacement of ascending thoracic aortic aneurysm  . S/P CABG x 3 10/22/2016   Sequential LIMA to Diag and LAD, SVG to distal LAD, open vein harvest right thigh  . S/P mitral valve repair 10/22/2016   Artificial Gore-tex neochord placement x6 - posterior annuloplasty band placed but removed due to systolic anterior motion of mitral valve  . Severe aortic stenosis    . Snores    Never been tested for sleep apnea  . Thoracic ascending aortic aneurysm ([place]) 10/21/2016  . Thrombocytopenia ([place]) 10/22/2016  . Type II diabetes mellitus ([place])     Past Surgical History:  Procedure Laterality Date  . AMPUTATION Bilateral 12/11/2016   Procedure: AMPUTATION BELOW KNEE BILATERALLY;  Surgeon: [name], MD;  Location: [facility];  Service: Orthopedics;  Laterality: Bilateral;  . AMPUTATION Bilateral 12/11/2016   Procedure: AMPUTATION BILATERAL HANDS EXCEPT RIGHT THUMB;  Surgeon: [name], MD;  Location: [facility];  Service: Orthopedics;  Laterality: Bilateral;  . AORTIC VALVE REPLACEMENT N/A 10/22/2016   Procedure: AORTIC VALVE REPLACEMENT (AVR) WITH SIZE 25 MM MAGNA EASE PERICARDIAL BIOPROSTHESIS - AORTIC;  Surgeon: Rexene Alberts, MD;  Location: [facility];  Service: Open Heart Surgery;  Laterality: N/A;  . BONE EXCISION Right 02/01/2017   Procedure: EXCISION RIGHT INDEX METACARPAL HEAD;  Surgeon: [name], MD;  Location: [facility];  Service: Orthopedics;  Laterality: Right;  . CARDIAC CATHETERIZATION N/A 10/20/2016   Procedure: Right/Left Heart Cath and Coronary Angiography;  Surgeon: Leonie Man, MD;  Location: [facility];  Service: Cardiovascular;  Laterality: N/A;  . CARPAL TUNNEL RELEASE Right 11/28/2013   Procedure: RIGHT WRIST CARPAL TUNNEL RELEASE;  Surgeon: Lorn Junes, MD;  Location: [facility];  Service: Orthopedics;  Laterality: Right;  . CATARACT EXTRACTION W/ INTRAOCULAR LENS  IMPLANT, BILATERAL  Bilateral 1978  . COLONOSCOPY    . CORONARY ARTERY BYPASS GRAFT N/A 10/22/2016   Procedure: CORONARY ARTERY BYPASS GRAFTING (CABG)x 2 WITH LIMA TO DIAGONAL, OPEN  HARVESTING OF RIGHT SAPHENOUS VEIN FOR VEIN GRAFT TO LAD;  Surgeon: Rexene Alberts, MD;  Location: [facility];  Service: Open Heart Surgery;  Laterality: N/A;  . FRACTURE SURGERY    . INGUINAL HERNIA REPAIR Right 1998  . IR GENERIC HISTORICAL  12/07/2016   IR US GUIDE VASC  ACCESS RIGHT 12/07/2016 Aletta Edouard, MD [facility]  . IR GENERIC HISTORICAL  12/07/2016   IR RADIOLOGY PERIPHERAL GUIDED IV START 12/07/2016 Aletta Edouard, MD [facility]  . IR GENERIC HISTORICAL  12/07/2016   IR GASTROSTOMY TUBE MOD SED 12/07/2016 Aletta Edouard, MD [facility]  . LIPOMA EXCISION Right 2008   "side of my head"  . MITRAL VALVE REPAIR N/A 10/22/2016   Procedure: MITRAL VALVE REPAIR (MVR) WITH SIZE 30 SORIN ANNULOFLEX ANNULOPLASTY RING WITH SUBSEQUENT REMOVAL OF RING;  Surgeon: Rexene Alberts, MD;  Location: [facility];  Service: Open Heart Surgery;  Laterality: N/A;  . PENECTOMY  2007   Peyronie's disease   . PENILE PROSTHESIS IMPLANT  2009  . REMOVAL OF PENILE PROSTHESIS N/A 02/01/2017   Procedure: REMOVAL OF PENILE PROSTHESIS;  Surgeon: Kathie Rhodes, MD;  Location: [facility];  Service: Urology;  Laterality: N/A;  . SHOULDER OPEN ROTATOR CUFF REPAIR Right 2006  . STERNAL CLOSURE N/A 10/26/2016   Procedure: STERNAL WASHOUT AND DELAYED PRIMARY CLOSURE;  Surgeon: Rexene Alberts, MD;  Location: [facility];  Service: Thoracic;  Laterality: N/A;  . TEE WITHOUT CARDIOVERSION N/A 10/26/2016   Procedure: TRANSESOPHAGEAL ECHOCARDIOGRAM (TEE);  Surgeon: Rexene Alberts, MD;  Location: [facility];  Service: Thoracic;  Laterality: N/A;  . TEE WITHOUT CARDIOVERSION N/A 10/22/2016   Procedure: TRANSESOPHAGEAL ECHOCARDIOGRAM (TEE);  Surgeon: Rexene Alberts, MD;  Location: [facility];  Service: Open Heart Surgery;  Laterality: N/A;  . THORACIC AORTIC ANEURYSM REPAIR  10/22/2016   Procedure: ASCENDING AORTIC  ANEURYSM REPAIR (AAA) WITH 28 MM HEMASHIELD PLATINUM WOVEN DOUBLE VELOUR VASCULAR GRAFT;  Surgeon: Rexene Alberts, MD;  Location: [facility];  Service: Open Heart Surgery;;  . TONSILLECTOMY  ~ 1955  . TRACHEOSTOMY TUBE PLACEMENT N/A 11/09/2016   Procedure: TRACHEOSTOMY;  Surgeon: Rexene Alberts, MD;  Location: [facility];  Service: Thoracic;  Laterality: N/A;  . TRANSURETHRAL RESECTION OF PROSTATE  2005  .  TRIGGER FINGER RELEASE Bilateral    several lt and rt hands  . TRIGGER FINGER RELEASE Right 11/28/2013   Procedure: RIGHT TRIGGER FINGER  RELEASE (TENDON SHEATH INCISION);  Surgeon: Lorn Junes, MD;  Location: [facility];  Service: Orthopedics;  Laterality: Right;  Marland Kitchen VIDEO BRONCHOSCOPY N/A 11/09/2016   Procedure: VIDEO BRONCHOSCOPY;  Surgeon: Rexene Alberts, MD;  Location: [facility];  Service: Thoracic;  Laterality: N/A;  . WRIST FRACTURE SURGERY Right ~ 1959    There were no vitals filed for this visit.      Subjective Assessment - 06/21/17 1103    Subjective  I haven't really used this prosthesis at home   Patient is accompained by: Family member  dtr   Pertinent History see epic. Pt with Bil BKA as well as bilateral UE amputations of all fingers excepet L thumb at MCP joint due to gangrene.    Patient Stated Goals walk, drive a car and go where I want to go. Wife hopes pt can go  out to eat in a restaurant and use public bathroom and eat .    Currently in Pain? No/denies                      OT Treatments/Exercises (OP) - 06/21/17 0001      ADLs   ADL Comments Addresed using rocker knife with prosthesis for cutting and pt able to maniupulate. Pt states he will purchase one. Pt reports he is now transferring into shower and bathing with supervision only. Pt reports he is having some difficulty holding hand held shower - pt to bring with him next session and will attempt to modify.  Pt also reports he is now mod I with toilet transfers, toilet hygiene and clothes manipulation.  Discsued use of walker for shower transfers as pt states he leaves walker outside of bathroom and leans on wife.  Recommended that pt side step into bathroom with RW to achieve greater independence and pt verbalized understanding. Practiced side stepping both directions - pt with one LOB when side stepping to the right however able to catch his own balance. Would recommend supevision at  this time however no physical assistance. Pt verbalized understanding.  Remo Lipps from prosthetics also attended last part of session - worked with him for modification for greater comfort and he willl also add strap to secure prosthesis better. Pt needed min a and cues to don and doff prosthesis and reported he did not really put it on this past week. Discussed that he needs to don and doff 3-4 times per day as well as use for brief periods to begin to learn what he wants to use it for.  Pt verbalized agreement and dtr  present. Pt ordered writing aid and using at home and can now write up to one sentence legibly.  Pt also ordered and is able to use use button hook with zipper pull .                   OT Short Term Goals - 06/21/17 1251      OT SHORT TERM GOAL #1   Title PT and wife will be mod I with home activities program - 06/14/2017   Status Achieved     OT SHORT TERM GOAL #2   Title Pt will be supervision for 3 in 1 commode transfers   Status Achieved     OT SHORT TERM GOAL #3   Title Pt will demonstrate ability to use opposition splint with prn with basic ADL tasks   Status Achieved     OT SHORT TERM GOAL #4   Title Pt will be able to complete simple cold snack prep with min a   Status Achieved           OT Long Term Goals - 06/21/17 1252      OT LONG TERM GOAL #1   Title Pt will be mod I with home activities program - 07/12/2017   Status On-going     OT LONG TERM GOAL #2   Title Pt will be mod I with toilet transfers   Status Achieved     OT LONG TERM GOAL #3   Title IF pt is able to complete stairs, pt will be mod I with tub bench transfers   Status On-going     OT LONG TERM GOAL #4   Title Pt will verbalize understanding of driving eval information   Status Achieved     OT LONG  TERM GOAL #5   Title Pt will be mod I with toilet hygiene with AE   Status Achieved     OT LONG TERM GOAL #6   Title Pt will be mod I with buttoning, zipping using  AE/prostheses   Status Achieved     OT LONG TERM GOAL #7   Title Pt will be mod I with cutting food with AE/prostheses   Status Achieved     OT LONG TERM GOAL #8   Title Pt will be able to write at 3 sentence level with 100% legibility with AE.    Status On-going               Plan - 06/21/17 1252    Clinical Impression Statement Pt progressing well toward goals. Pt gaining significant independence in basic ADL's    Rehab Potential Fair   OT Frequency 2x / week   OT Duration 8 weeks   OT Treatment/Interventions Self-care/ADL training;Moist Heat;Cryotherapy;Ultrasound;Fluidtherapy;DME and/or AE instruction;Neuromuscular education;Therapeutic exercise;Therapist, nutritional;Therapeutic activities;Splinting;Patient/family education;Balance training   Plan modify shower head if possible, practice using prosthesis for functional tasks, give HEP for UE strengthening.    Consulted and Agree with Plan of Care Patient;Family member/caregiver   Family Member Consulted dtr      Patient will benefit from skilled therapeutic intervention in order to improve the following deficits and impairments:  Abnormal gait, Decreased activity tolerance, Decreased balance, Decreased mobility, Decreased knowledge of use of DME, Difficulty walking, Impaired UE functional use, Impaired sensation, Pain, Other (comment)  Visit Diagnosis: Unsteadiness on feet  Other symptoms and signs involving the musculoskeletal system  Muscle weakness (generalized)  Pain in right hand  Pain in left hand  Other disturbances of skin sensation    Problem List Patient Active Problem List   Diagnosis Date Noted  . Amputation of both hands with complication, subsequent encounter 03/01/2017  . Diabetes ([place]) 02/19/2017  . Status post bilateral below knee amputation ([place]) 02/09/2017  . Wound disruption, post-op, skin, sequela 02/09/2017  . Debility 02/05/2017  . Ganglion upper arm, right   . Thrombosis of  both upper extremities   . Suspected heparin induced thrombocytopenia (HIT) in hospitalized patient ([place])   . Gangrene of lower extremity ([place])   . Heparin induced thrombocytopenia (HIT) ([place])   . Tracheostomy status ([place])   . Chest tube in place   . Atherosclerosis of native arteries of extremities with gangrene, left leg ([place])   . Atherosclerosis of native arteries of extremities with gangrene, right leg ([place])   . Acute on chronic diastolic CHF (congestive heart failure) ([place])   . Enteritis due to Clostridium difficile   . Anasarca   . FUO (fever of unknown origin)   . Acute encephalopathy   . Elevated LFTs   . DIC (disseminated intravascular coagulation) ([place]) 10/28/2016  . Postoperative atrial fibrillation ([name]) 10/24/2016  . Cardiogenic shock ([place])   . Mitral regurgitation due to cusp prolapse 10/22/2016  . S/P aortic valve replacement with bioprosthetic valve 10/22/2016  . S/P ascending aortic aneurysm repair 10/22/2016  . S/P mitral valve repair 10/22/2016  . S/P CABG x 3 10/22/2016  . Thrombocytopenia ([place]) 10/22/2016  . Acute combined systolic and diastolic congestive heart failure ([place]) 10/22/2016  . Acute respiratory failure ([place]) 10/22/2016  . Thoracic ascending aortic aneurysm ([place]) 10/21/2016  . Coronary artery disease involving native coronary artery of native heart with unstable angina pectoris ([place]) 10/20/2016  . Severe aortic stenosis by prior echocardiography 10/19/2016  . Unstable  angina ([place]) 10/19/2016  . Aortic valve stenosis 10/19/2016  . Bicuspid aortic valve 10/19/2016  . Trigger ring finger of right hand   . Wears glasses   . Gout   . Hypertension   . Carpal tunnel syndrome of right wrist   . Arthritis   . Snores     Quay Burow, OTR/L 06/21/2017, 12:55 PM  [facility] [address] [place], [place], [address] Phone: [phone]   Fax:  [phone]  Name: Scott [name] MRN: [id number] Date of Birth: December 20, 1945

## 2017-06-21 NOTE — Therapy (Signed)
[name] [address] [place], [place], [address] Phone: [phone]   Fax:  [phone]  Physical Therapy Treatment  Patient Details  Name: Scott [name] MRN: [id number] Date of Birth: [birth year] Referring Provider: Meridee Score MD   Encounter Date: 06/21/2017      PT End of Session - 06/21/17 2059    Visit Number 16   Number of Visits 33   Date for PT Re-Evaluation 08/20/17   Authorization Type Medicare & G-codes every 10th visit    PT Start Time 1015   PT Stop Time 1100   PT Time Calculation (min) 45 min   Equipment Utilized During Treatment Gait belt   Activity Tolerance Patient tolerated treatment well   Behavior During Therapy [facility] for tasks assessed/performed      Past Medical History:  Diagnosis Date  . Arthritis    "hips, shoulders; knees; back" (10/20/2016)  . Bicuspid aortic valve   . BPH (benign prostatic hypertrophy)   . Carpal tunnel syndrome of right wrist   . Coronary artery disease involving native coronary artery of native heart with unstable angina pectoris ([place]) 10/20/2016  . Diverticulitis   . GERD (gastroesophageal reflux disease)   . Gout   . Heart murmur   . Hyperlipemia   . Hypertension   . Postoperative atrial fibrillation ([place]) 10/24/2016  . RLS (restless legs syndrome)   . S/P aortic valve replacement with bioprosthetic valve 10/22/2016   25 mm [facility] Ease bovine pericardial bioprosthetic tissue valve  . S/P ascending aortic aneurysm repair 10/22/2016   28 mm supracoronary straight graft replacement of ascending thoracic aortic aneurysm  . S/P CABG x 3 10/22/2016   Sequential LIMA to Diag and LAD, SVG to distal LAD, open vein harvest right thigh  . S/P mitral valve repair 10/22/2016   Artificial Gore-tex neochord placement x6 - posterior annuloplasty band placed but removed due to systolic anterior motion of mitral valve  . Severe aortic stenosis   . Snores    Never been tested for sleep apnea  . Thoracic ascending aortic aneurysm ([place]) 10/21/2016  . Thrombocytopenia ([place]) 10/22/2016  . Type II diabetes mellitus ([place])     Past Surgical History:  Procedure Laterality Date  . AMPUTATION Bilateral 12/11/2016   Procedure: AMPUTATION BELOW KNEE BILATERALLY;  Surgeon: [name], MD;  Location: [place];  Service: Orthopedics;  Laterality: Bilateral;  . AMPUTATION Bilateral 12/11/2016   Procedure: AMPUTATION BILATERAL HANDS EXCEPT RIGHT THUMB;  Surgeon: [name], MD;  Location: [place];  Service: Orthopedics;  Laterality: Bilateral;  . AORTIC VALVE REPLACEMENT N/A 10/22/2016   Procedure: AORTIC VALVE REPLACEMENT (AVR) WITH SIZE 25 MM MAGNA EASE PERICARDIAL BIOPROSTHESIS - AORTIC;  Surgeon: Rexene Alberts, MD;  Location: [place];  Service: Open Heart Surgery;  Laterality: N/A;  . BONE EXCISION Right 02/01/2017   Procedure: EXCISION RIGHT INDEX METACARPAL HEAD;  Surgeon: [name], MD;  Location: [place];  Service: Orthopedics;  Laterality: Right;  . CARDIAC CATHETERIZATION N/A 10/20/2016   Procedure: Right/Left Heart Cath and Coronary Angiography;  Surgeon: Leonie Man, MD;  Location: [facility];  Service: Cardiovascular;  Laterality: N/A;  . CARPAL TUNNEL RELEASE Right 11/28/2013   Procedure: RIGHT WRIST CARPAL TUNNEL RELEASE;  Surgeon: Lorn Junes, MD;  Location: [place];  Service: Orthopedics;  Laterality: Right;  . CATARACT EXTRACTION W/ INTRAOCULAR LENS  IMPLANT, BILATERAL Bilateral 1978  . COLONOSCOPY    .  CORONARY ARTERY BYPASS GRAFT N/A 10/22/2016   Procedure: CORONARY ARTERY BYPASS GRAFTING (CABG)x 2 WITH LIMA TO DIAGONAL, OPEN  HARVESTING OF RIGHT SAPHENOUS VEIN FOR VEIN GRAFT TO LAD;  Surgeon: Rexene Alberts, MD;  Location: [place];  Service: Open Heart Surgery;  Laterality: N/A;  . FRACTURE SURGERY    . INGUINAL HERNIA REPAIR Right 1998  . IR GENERIC HISTORICAL  12/07/2016   IR US GUIDE VASC ACCESS RIGHT  12/07/2016 Aletta Edouard, MD [facility]  . IR GENERIC HISTORICAL  12/07/2016   IR RADIOLOGY PERIPHERAL GUIDED IV START 12/07/2016 Aletta Edouard, MD [facility]  . IR GENERIC HISTORICAL  12/07/2016   IR GASTROSTOMY TUBE MOD SED 12/07/2016 Aletta Edouard, MD [facility]  . LIPOMA EXCISION Right 2008   "side of my head"  . MITRAL VALVE REPAIR N/A 10/22/2016   Procedure: MITRAL VALVE REPAIR (MVR) WITH SIZE 30 SORIN ANNULOFLEX ANNULOPLASTY RING WITH SUBSEQUENT REMOVAL OF RING;  Surgeon: Rexene Alberts, MD;  Location: [facility];  Service: Open Heart Surgery;  Laterality: N/A;  . PENECTOMY  2007   Peyronie's disease   . PENILE PROSTHESIS IMPLANT  2009  . REMOVAL OF PENILE PROSTHESIS N/A 02/01/2017   Procedure: REMOVAL OF PENILE PROSTHESIS;  Surgeon: Kathie Rhodes, MD;  Location: [facility];  Service: Urology;  Laterality: N/A;  . SHOULDER OPEN ROTATOR CUFF REPAIR Right 2006  . STERNAL CLOSURE N/A 10/26/2016   Procedure: STERNAL WASHOUT AND DELAYED PRIMARY CLOSURE;  Surgeon: Rexene Alberts, MD;  Location: [place];  Service: Thoracic;  Laterality: N/A;  . TEE WITHOUT CARDIOVERSION N/A 10/26/2016   Procedure: TRANSESOPHAGEAL ECHOCARDIOGRAM (TEE);  Surgeon: Rexene Alberts, MD;  Location: [place];  Service: Thoracic;  Laterality: N/A;  . TEE WITHOUT CARDIOVERSION N/A 10/22/2016   Procedure: TRANSESOPHAGEAL ECHOCARDIOGRAM (TEE);  Surgeon: Rexene Alberts, MD;  Location: [place];  Service: Open Heart Surgery;  Laterality: N/A;  . THORACIC AORTIC ANEURYSM REPAIR  10/22/2016   Procedure: ASCENDING AORTIC  ANEURYSM REPAIR (AAA) WITH 28 MM HEMASHIELD PLATINUM WOVEN DOUBLE VELOUR VASCULAR GRAFT;  Surgeon: Rexene Alberts, MD;  Location: [facility];  Service: Open Heart Surgery;;  . TONSILLECTOMY  ~ 1955  . TRACHEOSTOMY TUBE PLACEMENT N/A 11/09/2016   Procedure: TRACHEOSTOMY;  Surgeon: Rexene Alberts, MD;  Location: [place];  Service: Thoracic;  Laterality: N/A;  . TRANSURETHRAL RESECTION OF PROSTATE  2005  . TRIGGER FINGER  RELEASE Bilateral    several lt and rt hands  . TRIGGER FINGER RELEASE Right 11/28/2013   Procedure: RIGHT TRIGGER FINGER  RELEASE (TENDON SHEATH INCISION);  Surgeon: Lorn Junes, MD;  Location: [facility];  Service: Orthopedics;  Laterality: Right;  Marland Kitchen VIDEO BRONCHOSCOPY N/A 11/09/2016   Procedure: VIDEO BRONCHOSCOPY;  Surgeon: Rexene Alberts, MD;  Location: [place];  Service: Thoracic;  Laterality: N/A;  . WRIST FRACTURE SURGERY Right ~ 1959    There were no vitals filed for this visit.      Subjective Assessment - 06/21/17 1016    Subjective He is wearing prostheses 4 hrs on, 2 hrs off, 3sets. wounds are healing on left but issue on RLE.    Patient is accompained by: Family member   Pertinent History arthritis, bicuspid aortic valve, BPH, carpal tunnel syndrome in R wrist, CAD, diverticulitis, GERD, gout, hyperlipidemia, HTN, DM type II,thrombocytopenia, thoracic ascending aortic aneurysm, severe aortic stenosis, amputation of bilateral hands except right thumb (12/11/2016) due to gangrene, bilateral transtibial amputations (12/11/2016) due to gangrene,  R and L heart cath (10/20/2016)    Limitations Standing;Walking;House hold activities   Patient Stated Goals use prostheses to walk including stairs, outdoors, target shooting, drive   Currently in Pain? No/denies                         [facility] Adult PT Treatment/Exercise - 06/21/17 1015      Transfers   Transfers Sit to Stand;Stand to Sit   Sit to Stand 5: Supervision;4: Min assist;With upper extremity assist;With armrests;From chair/3-in-1  MinA simulated couch   Sit to Stand Details Tactile cues for sequencing;Verbal cues for technique   Sit to Stand Details (indicate cue type and reason) worked on standing from chair with armrests with UE support to stabilize; standing from chairs without armrests to RW & to no support; simulated soft, low couch with armrest on Rt & Lt sides-less assist with Rt  armrests.    Stand to Sit 4: Min guard;5: Supervision;With upper extremity assist;With armrests;Other (comment)  Min guard simulated couch   Stand to Sit Details (indicate cue type and reason) Tactile cues for sequencing;Verbal cues for technique;Verbal cues for sequencing   Stand to Sit Details work on sitting to chairs with & without armrests with & without RW for stabilization; min guard to simulated couch (low, soft)     Ambulation/Gait   Ambulation/Gait Yes   Ambulation/Gait Assistance 5: Supervision   Ambulation Distance (Feet) 200 Feet   Assistive device Rolling walker;Prostheses   Gait Pattern Step-through pattern;Decreased stride length;Trunk flexed;Wide base of support   Ambulation Surface Indoor;Level   Stairs Yes   Stairs Assistance 5: Supervision   Stairs Assistance Details (indicate cue type and reason) progressed to reciprocal pattern with 2 rails; PT demo & verbal cues on technique   Stair Management Technique One rail Left;Sideways;Step to pattern;Two rails;Alternating pattern;Forwards  2hands on left rail step-to & 2 rails forward alternating   Number of Stairs 4  3 reps   Height of Stairs 6   Ramp 5: Supervision;4: Min assist  Min guard with RW & prostheses   Ramp Details (indicate cue type and reason) tactile & verbal cues on wt shift & posture   Curb 4: Min assist;3: Mod assist  Min guard except 1st attempt to move RW needed mod A for bal   Curb Details (indicate cue type and reason) verbal cues on positioning prior to moving RW.    Gait Comments ModA to ambulate 5' without AD.      Exercises   Exercises Knee/Hip     Knee/Hip Exercises: Standing   Lateral Step Up Right;Left;5 reps;Hand Hold: 2;Step Height: 4"   Forward Step Up 5 reps;Hand Hold: 2;Step Height: 4";Right;Left   Forward Step Up Limitations alternating lead limb.    Step Down Right;Left;5 reps;Hand Hold: 2;Step Height: 4"   Step Down Limitations alternating lead limb     Prosthetics    Prosthetic Care Comments  RLE: 31mm wound with small amount of bloody drainage, no signs of infection.  LLE: 42mm wound at tibial tubercle, superficial; prosthetist present at end of OT session so PT showed him spot with plans to heat up inner liner for greater relief to area.    Current prosthetic wear tolerance (days/week)  daily    Current prosthetic wear tolerance (#hours/day)  4-5hours on, 2 hours off rotation throughout day   Education Provided Skin check;Residual limb care;Prosthetic cleaning;Proper wear schedule/adjustment;Proper Donning;Correct ply sock adjustment;Other (comment)   Person(s) Educated Patient  Education Method Explanation;Verbal cues   Education Method Verbalized understanding;Needs further instruction;Verbal cues required                  PT Short Term Goals - 06/21/17 2101      PT SHORT TERM GOAL #1   Title Patient tolerates bil. LE prostheses wear >75% of awake hours without skin issues.    Time 1   Period Months   Status New   Target Date 07/21/17     PT SHORT TERM GOAL #2   Title Patient performs sit to/from stand from chairs without armrests without UE support to stablize modified independent.    Time 1   Period Months   Status New   Target Date 07/21/17     PT SHORT TERM GOAL #3   Title Patient reaches 10" anteriorly & within 5" of floor without UE support with supervision.    Time 1   Period Months   Status New   Target Date 07/21/17     PT SHORT TERM GOAL #4   Title Patient ambulates 300' with single UE support or less with minA.    Time 1   Period Months   Status New   Target Date 07/21/17     PT SHORT TERM GOAL #5   Title Patient negotiates ramps & curbs with cane or less with bil. prostheses with minA.    Time 1   Period Months   Status New   Target Date 07/21/17           PT Long Term Goals - 06/21/17 2106      PT LONG TERM GOAL #1   Title Patient demonstrates & verbalizes understanding of proper prosthetic care  including independent donning to enable safe use of LE prostheses.    Time 2   Period Months   Status Revised   Target Date 08/20/17     PT LONG TERM GOAL #2   Title Patient will tolerate prostheses wear >90% of awake hours without skin issues or limb pain to demonstrate a decrease in his risk of falling.   Time 2   Period Months   Status On-going   Target Date 08/20/17     PT LONG TERM GOAL #3   Title Patient performs standing balance activities with intermittent UE support reaching 10", picking up objects from floor and looks over shoulders with weight shift, trunk rotation with prostheses independently.   Time 4   Period Weeks   Status On-going   Target Date 08/20/17     PT LONG TERM GOAL #4   Title Patient will ambulate 1000 feet including outdoor surfaces with LRAD and prostheses modified independently to enable community mobility.     Time 4   Period Months   Status On-going   Target Date 08/20/17     PT LONG TERM GOAL #5   Title Patient will negotiate ramp/curbs and stairs with LRAD and prostheses modified independent for community access.    Time 4   Period Months   Status On-going   Target Date 08/20/17     PT LONG TERM GOAL #6   Title Patient's gait velocity will be >/= 1.67ft/s to indicate a limited community ambulator. (Target Date: 08/20/2017)   Time 4   Period Weeks   Status On-going     PT LONG TERM GOAL #7   Title Patient reports Activities of Balance Confindence score using FOTO >25% to indicate greater confidence in his balance. (Target Date:  08/20/2017)   Time 4   Period Months   Status On-going               Plan - 06/21/17 2110    Clinical Impression Statement Patient improved sit to /from stand from various surfaces with skilled instruction. Patient has progressed function during first 60 day certification period and would benefit from another 60days of skilled care to progress his function with bil. prostheses at community level.    Rehab  Potential Good   Clinical Impairments Affecting Rehab Potential arthritis, bicuspid aortic valve, BPH, carpal tunnel syndrome in R wrist, CAD, diverticulitis, GERD, gout, hyperlipidemia, HTN, DM type II,thrombocytopenia, thoracic ascending aortic aneurysm, severe aortic stenosis, amputation of bilateral hands except right thumb (12/11/2016) due to gangrene, bilateral transtibial amputations (12/11/2016) due to gangrene, R and L heart cath (10/20/2016)    PT Frequency 2x / week   PT Duration Other (comment)  60 days (9 weeks)   PT Treatment/Interventions ADLs/Self Care Home Management;Neuromuscular re-education;Balance training;Therapeutic exercise;Therapeutic activities;Functional mobility training;Stair training;Gait training;DME Instruction;Patient/family education;Prosthetic Training   PT Next Visit Plan progress prosthetic gait training and barriers with RW; gait training uisng a SPC, progress balance activities including multi direction reaching;    Consulted and Agree with Plan of Care Patient;Family member/caregiver   Family Member Consulted daughter       Patient will benefit from skilled therapeutic intervention in order to improve the following deficits and impairments:  Abnormal gait, Decreased activity tolerance, Decreased balance, Decreased coordination, Decreased range of motion, Decreased mobility, Decreased knowledge of use of DME, Decreased knowledge of precautions, Decreased endurance, Decreased skin integrity, Decreased scar mobility, Decreased strength, Difficulty walking, Postural dysfunction, Prosthetic Dependency  Visit Diagnosis: Unsteadiness on feet  Other symptoms and signs involving the musculoskeletal system  Muscle weakness (generalized)  Other abnormalities of gait and mobility  Other disturbances of skin sensation  Abnormal posture     Problem List Patient Active Problem List   Diagnosis Date Noted  . Amputation of both hands with complication, subsequent  encounter 03/01/2017  . Diabetes ([place]) 02/19/2017  . Status post bilateral below knee amputation ([place]) 02/09/2017  . Wound disruption, post-op, skin, sequela 02/09/2017  . Debility 02/05/2017  . Ganglion upper arm, right   . Thrombosis of both upper extremities   . Suspected heparin induced thrombocytopenia (HIT) in hospitalized patient ([place])   . Gangrene of lower extremity ([place])   . Heparin induced thrombocytopenia (HIT) ([place])   . Tracheostomy status ([place])   . Chest tube in place   . Atherosclerosis of native arteries of extremities with gangrene, left leg ([place])   . Atherosclerosis of native arteries of extremities with gangrene, right leg ([place])   . Acute on chronic diastolic CHF (congestive heart failure) ([place])   . Enteritis due to Clostridium difficile   . Anasarca   . FUO (fever of unknown origin)   . Acute encephalopathy   . Elevated LFTs   . DIC (disseminated intravascular coagulation) ([place]) 10/28/2016  . Postoperative atrial fibrillation ([place]) 10/24/2016  . Cardiogenic shock ([place])   . Mitral regurgitation due to cusp prolapse 10/22/2016  . S/P aortic valve replacement with bioprosthetic valve 10/22/2016  . S/P ascending aortic aneurysm repair 10/22/2016  . S/P mitral valve repair 10/22/2016  . S/P CABG x 3 10/22/2016  . Thrombocytopenia ([place]) 10/22/2016  . Acute combined systolic and diastolic congestive heart failure ([place]) 10/22/2016  . Acute respiratory failure ([place]) 10/22/2016  . Thoracic ascending aortic aneurysm ([place]) 10/21/2016  .  Coronary artery disease involving native coronary artery of native heart with unstable angina pectoris ([place]) 10/20/2016  . Severe aortic stenosis by prior echocardiography 10/19/2016  . Unstable angina ([place]) 10/19/2016  . Aortic valve stenosis 10/19/2016  . Bicuspid aortic valve 10/19/2016  . Trigger ring finger of right hand   . Wears glasses   . Gout   . Hypertension   . Carpal tunnel syndrome of right wrist   . Arthritis   . Snores      Arijana Narayan PT, DPT 06/21/2017, 9:17 PM  [facility] [address] [place], [place], [address] Phone: [phone]   Fax:  [phone]  Name: Scott [name] MRN: [id number] Date of Birth: May 14, 1946

## 2017-06-21 NOTE — Patient Instructions (Addendum)
Perform near sink or rail with chair back opposite side. Maintain support with both hands but focus on using legs more than arms.    FUNCTIONAL MOBILITY: Step Up / Step Down    Step up, leading with left leg. Step down, leading with left leg. ___ reps per set, ___ sets per day, ___ days per week Repeat leading with other leg. Hold onto a support.  Copyright  VHI. All rights reserved.  FUNCTIONAL MOBILITY: Lateral Step Up    Step up sideways, leading with right leg. _10__ reps leading with each leg Copyright  VHI. All rights reserved.  Step-Down: Forward    Step down forward. Keep pelvis neutral. Do _10__ times, each leg, __1-2_ times per day.  http://ss.exer.us/176   Copyright  VHI. All rights reserved.  Step-Up: Forward    Leading with right leg, bring both feet onto __4__ inch step. Return to starting position, leading with left leg. Repeat __10__ times per session. Do __1-2__ sessions per day.   Copyright  VHI. All rights reserved.  Step-Up: Lateral    Step up to side with right leg. Bring other foot up onto ___4_ inch step. Return to floor position with left leg. Repeat _10___ times per session. Do __1-2__ sessions per day.   Copyright  VHI. All rights reserved.

## 2017-06-23 ENCOUNTER — Ambulatory Visit: Payer: Medicare Other | Admitting: Occupational Therapy

## 2017-06-23 ENCOUNTER — Encounter: Payer: Self-pay | Admitting: Physical Therapy

## 2017-06-23 ENCOUNTER — Encounter: Payer: Self-pay | Admitting: Occupational Therapy

## 2017-06-23 ENCOUNTER — Ambulatory Visit: Payer: Medicare Other | Attending: Orthopedic Surgery | Admitting: Physical Therapy

## 2017-06-23 DIAGNOSIS — R2681 Unsteadiness on feet: Secondary | ICD-10-CM

## 2017-06-23 DIAGNOSIS — M79641 Pain in right hand: Secondary | ICD-10-CM | POA: Insufficient documentation

## 2017-06-23 DIAGNOSIS — R29898 Other symptoms and signs involving the musculoskeletal system: Secondary | ICD-10-CM | POA: Insufficient documentation

## 2017-06-23 DIAGNOSIS — R2689 Other abnormalities of gait and mobility: Secondary | ICD-10-CM

## 2017-06-23 DIAGNOSIS — M6281 Muscle weakness (generalized): Secondary | ICD-10-CM | POA: Diagnosis not present

## 2017-06-23 DIAGNOSIS — M79642 Pain in left hand: Secondary | ICD-10-CM | POA: Insufficient documentation

## 2017-06-23 DIAGNOSIS — R293 Abnormal posture: Secondary | ICD-10-CM | POA: Diagnosis not present

## 2017-06-23 DIAGNOSIS — R208 Other disturbances of skin sensation: Secondary | ICD-10-CM | POA: Diagnosis not present

## 2017-06-23 NOTE — Patient Instructions (Addendum)
Putting arm prosthesis on:  Put left arm in prosthesis first. Bring strap in front of your chest. Make sure the strap goes over the top of the prosthesis not under your elbow.  Put your right arm into the strap. Make sure the strap isn't twisted.  Lift both arms all the way up over your head and put straps over your head.  Adjust the strap in back and then tighten the prosthesis.  Taking if off:  Take if off your right arm first and then take if off your left arm.

## 2017-06-23 NOTE — Therapy (Signed)
[facility] [address] [place], [place], [address] Phone: [phone]   Fax:  [phone]  Occupational Therapy Treatment  Patient Details  Name: Scott [name] MRN: [id number] Date of Birth: [birth year] Referring Provider: Dr. Meridee Score  Encounter Date: 06/23/2017      OT End of Session - 06/23/17 1242    Visit Number 8   Number of Visits 16   Date for OT Re-Evaluation 07/21/17   Authorization Type medicare pt will need G code and PN every 10th visit   Authorization Time Period 60 days   Authorization - Visit Number 8   Authorization - Number of Visits 10   OT Start Time 5009   OT Stop Time 1231   OT Time Calculation (min) 46 min   Activity Tolerance Patient tolerated treatment well      Past Medical History:  Diagnosis Date  . Arthritis    "hips, shoulders; knees; back" (10/20/2016)  . Bicuspid aortic valve   . BPH (benign prostatic hypertrophy)   . Carpal tunnel syndrome of right wrist   . Coronary artery disease involving native coronary artery of native heart with unstable angina pectoris ([place]) 10/20/2016  . Diverticulitis   . GERD (gastroesophageal reflux disease)   . Gout   . Heart murmur   . Hyperlipemia   . Hypertension   . Postoperative atrial fibrillation ([place]) 10/24/2016  . RLS (restless legs syndrome)   . S/P aortic valve replacement with bioprosthetic valve 10/22/2016   25 mm [facility] Ease bovine pericardial bioprosthetic tissue valve  . S/P ascending aortic aneurysm repair 10/22/2016   28 mm supracoronary straight graft replacement of ascending thoracic aortic aneurysm  . S/P CABG x 3 10/22/2016   Sequential LIMA to Diag and LAD, SVG to distal LAD, open vein harvest right thigh  . S/P mitral valve repair 10/22/2016   Artificial Gore-tex neochord placement x6 - posterior annuloplasty band placed but removed due to systolic anterior motion of mitral valve  . Severe aortic stenosis    . Snores    Never been tested for sleep apnea  . Thoracic ascending aortic aneurysm ([place]) 10/21/2016  . Thrombocytopenia ([place]) 10/22/2016  . Type II diabetes mellitus ([place])     Past Surgical History:  Procedure Laterality Date  . AMPUTATION Bilateral 12/11/2016   Procedure: AMPUTATION BELOW KNEE BILATERALLY;  Surgeon: [name], MD;  Location: [facility];  Service: Orthopedics;  Laterality: Bilateral;  . AMPUTATION Bilateral 12/11/2016   Procedure: AMPUTATION BILATERAL HANDS EXCEPT RIGHT THUMB;  Surgeon: [name], MD;  Location: [facility];  Service: Orthopedics;  Laterality: Bilateral;  . AORTIC VALVE REPLACEMENT N/A 10/22/2016   Procedure: AORTIC VALVE REPLACEMENT (AVR) WITH SIZE 25 MM MAGNA EASE PERICARDIAL BIOPROSTHESIS - AORTIC;  Surgeon: Rexene Alberts, MD;  Location: [facility];  Service: Open Heart Surgery;  Laterality: N/A;  . BONE EXCISION Right 02/01/2017   Procedure: EXCISION RIGHT INDEX METACARPAL HEAD;  Surgeon: [name], MD;  Location: [facility];  Service: Orthopedics;  Laterality: Right;  . CARDIAC CATHETERIZATION N/A 10/20/2016   Procedure: Right/Left Heart Cath and Coronary Angiography;  Surgeon: Leonie Man, MD;  Location: [facility];  Service: Cardiovascular;  Laterality: N/A;  . CARPAL TUNNEL RELEASE Right 11/28/2013   Procedure: RIGHT WRIST CARPAL TUNNEL RELEASE;  Surgeon: Lorn Junes, MD;  Location: [facility];  Service: Orthopedics;  Laterality: Right;  . CATARACT EXTRACTION W/ INTRAOCULAR LENS  IMPLANT, BILATERAL  Bilateral 1978  . COLONOSCOPY    . CORONARY ARTERY BYPASS GRAFT N/A 10/22/2016   Procedure: CORONARY ARTERY BYPASS GRAFTING (CABG)x 2 WITH LIMA TO DIAGONAL, OPEN  HARVESTING OF RIGHT SAPHENOUS VEIN FOR VEIN GRAFT TO LAD;  Surgeon: Rexene Alberts, MD;  Location: [facility];  Service: Open Heart Surgery;  Laterality: N/A;  . FRACTURE SURGERY    . INGUINAL HERNIA REPAIR Right 1998  . IR GENERIC HISTORICAL  12/07/2016   IR US GUIDE VASC  ACCESS RIGHT 12/07/2016 Aletta Edouard, MD [facility]  . IR GENERIC HISTORICAL  12/07/2016   IR RADIOLOGY PERIPHERAL GUIDED IV START 12/07/2016 Aletta Edouard, MD [facility]  . IR GENERIC HISTORICAL  12/07/2016   IR GASTROSTOMY TUBE MOD SED 12/07/2016 Aletta Edouard, MD [facility]  . LIPOMA EXCISION Right 2008   "side of my head"  . MITRAL VALVE REPAIR N/A 10/22/2016   Procedure: MITRAL VALVE REPAIR (MVR) WITH SIZE 30 SORIN ANNULOFLEX ANNULOPLASTY RING WITH SUBSEQUENT REMOVAL OF RING;  Surgeon: Rexene Alberts, MD;  Location: [facility];  Service: Open Heart Surgery;  Laterality: N/A;  . PENECTOMY  2007   Peyronie's disease   . PENILE PROSTHESIS IMPLANT  2009  . REMOVAL OF PENILE PROSTHESIS N/A 02/01/2017   Procedure: REMOVAL OF PENILE PROSTHESIS;  Surgeon: Kathie Rhodes, MD;  Location: [facility];  Service: Urology;  Laterality: N/A;  . SHOULDER OPEN ROTATOR CUFF REPAIR Right 2006  . STERNAL CLOSURE N/A 10/26/2016   Procedure: STERNAL WASHOUT AND DELAYED PRIMARY CLOSURE;  Surgeon: Rexene Alberts, MD;  Location: [facility];  Service: Thoracic;  Laterality: N/A;  . TEE WITHOUT CARDIOVERSION N/A 10/26/2016   Procedure: TRANSESOPHAGEAL ECHOCARDIOGRAM (TEE);  Surgeon: Rexene Alberts, MD;  Location: [facility];  Service: Thoracic;  Laterality: N/A;  . TEE WITHOUT CARDIOVERSION N/A 10/22/2016   Procedure: TRANSESOPHAGEAL ECHOCARDIOGRAM (TEE);  Surgeon: Rexene Alberts, MD;  Location: [facility];  Service: Open Heart Surgery;  Laterality: N/A;  . THORACIC AORTIC ANEURYSM REPAIR  10/22/2016   Procedure: ASCENDING AORTIC  ANEURYSM REPAIR (AAA) WITH 28 MM HEMASHIELD PLATINUM WOVEN DOUBLE VELOUR VASCULAR GRAFT;  Surgeon: Rexene Alberts, MD;  Location: [facility];  Service: Open Heart Surgery;;  . TONSILLECTOMY  ~ 1955  . TRACHEOSTOMY TUBE PLACEMENT N/A 11/09/2016   Procedure: TRACHEOSTOMY;  Surgeon: Rexene Alberts, MD;  Location: [facility];  Service: Thoracic;  Laterality: N/A;  . TRANSURETHRAL RESECTION OF PROSTATE  2005  .  TRIGGER FINGER RELEASE Bilateral    several lt and rt hands  . TRIGGER FINGER RELEASE Right 11/28/2013   Procedure: RIGHT TRIGGER FINGER  RELEASE (TENDON SHEATH INCISION);  Surgeon: Lorn Junes, MD;  Location: [facility];  Service: Orthopedics;  Laterality: Right;  Marland Kitchen VIDEO BRONCHOSCOPY N/A 11/09/2016   Procedure: VIDEO BRONCHOSCOPY;  Surgeon: Rexene Alberts, MD;  Location: [facility];  Service: Thoracic;  Laterality: N/A;  . WRIST FRACTURE SURGERY Right ~ 1959    There were no vitals filed for this visit.      Subjective Assessment - 06/23/17 1147    Pertinent History see epic. Pt with Bil BKA as well as bilateral UE amputations of all fingers excepet L thumb at MCP joint due to gangrene.    Patient Stated Goals walk, drive a car and go where I want to go. Wife hopes pt can go out to eat in a restaurant and use public bathroom and eat .  OT Treatments/Exercises (OP) - 06/23/17 0001      ADLs   ADL Comments Practiced donning and doffing of UE prosthesis. Pt needs min cues at this point. Provided directions in writing and pt to practice this weekend.  Also practiced using prosthesis to interact with objects in the environment.  Pt practiced using hook as well as power claw and also discussed various uses and difference betweent them. Prosthetist here today and prosthesis has been modified for better fit and easier use. Pt to practice frequent short bursts of use this weekend and is aware to contact prosthetist if there are any issues with fit.  Pt has h/o of arthritis and can no longer take daily anti-inflammatory therefore will need to build up tolerance of use more slowly.  Also practiced writing with AE - pt able to write short sentences with 100% legibilty.                 OT Education - 06/23/17 1237    Education provided Yes   Education Details donning/doffing UE prosthesis   Person(s) Educated Patient   Methods  Explanation;Demonstration;Verbal cues;Handout   Comprehension Verbalized understanding;Returned demonstration;Need further instruction          OT Short Term Goals - 06/23/17 1238      OT SHORT TERM GOAL #1   Title PT and wife will be mod I with home activities program - 06/14/2017   Status Achieved     OT SHORT TERM GOAL #2   Title Pt will be supervision for 3 in 1 commode transfers   Status Achieved     OT SHORT TERM GOAL #3   Title Pt will demonstrate ability to use opposition splint with prn with basic ADL tasks   Status Achieved     OT SHORT TERM GOAL #4   Title Pt will be able to complete simple cold snack prep with min a   Status Achieved           OT Long Term Goals - 06/23/17 1238      OT LONG TERM GOAL #1   Title Pt will be mod I with home activities program - 07/21/2017   Status On-going     OT LONG TERM GOAL #2   Title Pt will be mod I with toilet transfers   Status Achieved     OT LONG TERM GOAL #3   Title IF pt is able to complete stairs, pt will be mod I with tub bench transfers   Status On-going     OT LONG TERM GOAL #4   Title Pt will verbalize understanding of driving eval information   Status Achieved     OT LONG TERM GOAL #5   Title Pt will be mod I with toilet hygiene with AE   Status Achieved     OT LONG TERM GOAL #6   Title Pt will be mod I with buttoning, zipping using AE/prostheses   Status Achieved     OT LONG TERM GOAL #7   Title Pt will be mod I with cutting food with AE/prostheses   Status Achieved     OT LONG TERM GOAL #8   Title Pt will be able to write at 3 sentence level with 100% legibility with AE.    Status On-going     OT LONG TERM GOAL  #9   Baseline Pt will demonstrate ability to use LUE prosthesis as stabilizer during simple functional tasks   Status New  OT LONG TERM GOAL  #10   TITLE Pt will be mod I with donning and doffing LUE prosthesis.   Status New               Plan - 06/23/17 1241     Clinical Impression Statement Pt with excellent progression toward goals.  Pt beginning LUE prosthesis training.    Rehab Potential Good   OT Frequency 2x / week   OT Duration 8 weeks   OT Treatment/Interventions Self-care/ADL training;Moist Heat;Cryotherapy;Ultrasound;Fluidtherapy;DME and/or AE instruction;Neuromuscular education;Therapeutic exercise;Therapist, nutritional;Therapeutic activities;Splinting;Patient/family education;Balance training   Plan modify shower head if pt brings it in, practice donning and doffing LUE prosthesis, practice using LUE prosthesis functionally,    Consulted and Agree with Plan of Care Patient      Patient will benefit from skilled therapeutic intervention in order to improve the following deficits and impairments:  Abnormal gait, Decreased activity tolerance, Decreased balance, Decreased mobility, Decreased knowledge of use of DME, Difficulty walking, Impaired UE functional use, Impaired sensation, Pain, Other (comment)  Visit Diagnosis: Unsteadiness on feet - Plan: Ot plan of care cert/re-cert  Other symptoms and signs involving the musculoskeletal system - Plan: Ot plan of care cert/re-cert  Muscle weakness (generalized) - Plan: Ot plan of care cert/re-cert  Pain in right hand - Plan: Ot plan of care cert/re-cert  Pain in left hand - Plan: Ot plan of care cert/re-cert  Other disturbances of skin sensation - Plan: Ot plan of care cert/re-cert    Problem List Patient Active Problem List   Diagnosis Date Noted  . Amputation of both hands with complication, subsequent encounter 03/01/2017  . Diabetes ([place]) 02/19/2017  . Status post bilateral below knee amputation ([place]) 02/09/2017  . Wound disruption, post-op, skin, sequela 02/09/2017  . Debility 02/05/2017  . Ganglion upper arm, right   . Thrombosis of both upper extremities   . Suspected heparin induced thrombocytopenia (HIT) in hospitalized patient ([place])   . Gangrene of lower extremity  ([place])   . Heparin induced thrombocytopenia (HIT) ([place])   . Tracheostomy status ([place])   . Chest tube in place   . Atherosclerosis of native arteries of extremities with gangrene, left leg ([place])   . Atherosclerosis of native arteries of extremities with gangrene, right leg ([place])   . Acute on chronic diastolic CHF (congestive heart failure) ([place])   . Enteritis due to Clostridium difficile   . Anasarca   . FUO (fever of unknown origin)   . Acute encephalopathy   . Elevated LFTs   . DIC (disseminated intravascular coagulation) ([place]) 10/28/2016  . Postoperative atrial fibrillation ([place]) 10/24/2016  . Cardiogenic shock ([place])   . Mitral regurgitation due to cusp prolapse 10/22/2016  . S/P aortic valve replacement with bioprosthetic valve 10/22/2016  . S/P ascending aortic aneurysm repair 10/22/2016  . S/P mitral valve repair 10/22/2016  . S/P CABG x 3 10/22/2016  . Thrombocytopenia ([place]) 10/22/2016  . Acute combined systolic and diastolic congestive heart failure ([place]) 10/22/2016  . Acute respiratory failure ([place]) 10/22/2016  . Thoracic ascending aortic aneurysm ([place]) 10/21/2016  . Coronary artery disease involving native coronary artery of native heart with unstable angina pectoris ([place]) 10/20/2016  . Severe aortic stenosis by prior echocardiography 10/19/2016  . Unstable angina ([place]) 10/19/2016  . Aortic valve stenosis 10/19/2016  . Bicuspid aortic valve 10/19/2016  . Trigger ring finger of right hand   . Wears glasses   . Gout   . Hypertension   . Carpal tunnel syndrome  of right wrist   . Arthritis   . Snores     Quay Burow, OTR/L 06/23/2017, 12:46 PM  [facility] [address] [place], [place], [address] Phone: [phone]   Fax:  [phone]  Name: Scott [name] MRN: [id number] Date of Birth: 25-Jan-1946

## 2017-06-24 NOTE — Therapy (Signed)
Scott [name] [address] [name], [place], [address] Phone: [phone]   Fax:  [phone]  Physical Therapy Treatment  Patient Details  Name: Scott [name] MRN: [id number] Date of Birth: September 02, 1946 Referring Provider: Meridee Score MD   Encounter Date: 06/23/2017      PT End of Session - 06/23/17 1112    Visit Number 17   Number of Visits 33   Date for PT Re-Evaluation 08/20/17   Authorization Type Medicare & G-codes every 10th visit    PT Start Time 1104   PT Stop Time 1145   PT Time Calculation (min) 41 min   Equipment Utilized During Treatment Gait belt   Activity Tolerance Patient tolerated treatment well   Behavior During Therapy [facility] for tasks assessed/performed      Past Medical History:  Diagnosis Date  . Arthritis    "hips, shoulders; knees; back" (10/20/2016)  . Bicuspid aortic valve   . BPH (benign prostatic hypertrophy)   . Carpal tunnel syndrome of right wrist   . Coronary artery disease involving native coronary artery of native heart with unstable angina pectoris ([place]) 10/20/2016  . Diverticulitis   . GERD (gastroesophageal reflux disease)   . Gout   . Heart murmur   . Hyperlipemia   . Hypertension   . Postoperative atrial fibrillation ([place]) 10/24/2016  . RLS (restless legs syndrome)   . S/P aortic valve replacement with bioprosthetic valve 10/22/2016   25 mm [facility] Ease bovine pericardial bioprosthetic tissue valve  . S/P ascending aortic aneurysm repair 10/22/2016   28 mm supracoronary straight graft replacement of ascending thoracic aortic aneurysm  . S/P CABG x 3 10/22/2016   Sequential LIMA to Diag and LAD, SVG to distal LAD, open vein harvest right thigh  . S/P mitral valve repair 10/22/2016   Artificial Gore-tex neochord placement x6 - posterior annuloplasty band placed but removed due to systolic anterior motion of mitral valve  . Severe aortic stenosis   . Snores    Never  been tested for sleep apnea  . Thoracic ascending aortic aneurysm ([place]) 10/21/2016  . Thrombocytopenia ([place]) 10/22/2016  . Type II diabetes mellitus ([place])     Past Surgical History:  Procedure Laterality Date  . AMPUTATION Bilateral 12/11/2016   Procedure: AMPUTATION BELOW KNEE BILATERALLY;  Surgeon: Scott Minion, MD;  Location: [place];  Service: Orthopedics;  Laterality: Bilateral;  . AMPUTATION Bilateral 12/11/2016   Procedure: AMPUTATION BILATERAL HANDS EXCEPT RIGHT THUMB;  Surgeon: [place] Minion, MD;  Location: [place];  Service: Orthopedics;  Laterality: Bilateral;  . AORTIC VALVE REPLACEMENT N/A 10/22/2016   Procedure: AORTIC VALVE REPLACEMENT (AVR) WITH SIZE 25 MM MAGNA EASE PERICARDIAL BIOPROSTHESIS - AORTIC;  Surgeon: [place] Alberts, MD;  Location: [place];  Service: Open Heart Surgery;  Laterality: N/A;  . BONE EXCISION Right 02/01/2017   Procedure: EXCISION RIGHT INDEX METACARPAL HEAD;  Surgeon: [place] Minion, MD;  Location: [place];  Service: Orthopedics;  Laterality: Right;  . CARDIAC CATHETERIZATION N/A 10/20/2016   Procedure: Right/Left Heart Cath and Coronary Angiography;  Surgeon: Leonie Man, MD;  Location: [place] [facility];  Service: Cardiovascular;  Laterality: N/A;  . CARPAL TUNNEL RELEASE Right 11/28/2013   Procedure: RIGHT WRIST CARPAL TUNNEL RELEASE;  Surgeon: [place] Junes, MD;  Location: [place];  Service: Orthopedics;  Laterality: Right;  . CATARACT EXTRACTION W/ INTRAOCULAR LENS  IMPLANT, BILATERAL Bilateral 1978  . COLONOSCOPY    .  CORONARY ARTERY BYPASS GRAFT N/A 10/22/2016   Procedure: CORONARY ARTERY BYPASS GRAFTING (CABG)x 2 WITH LIMA TO DIAGONAL, OPEN  HARVESTING OF RIGHT SAPHENOUS VEIN FOR VEIN GRAFT TO LAD;  Surgeon: [place] Alberts, MD;  Location: [place];  Service: Open Heart Surgery;  Laterality: N/A;  . FRACTURE SURGERY    . INGUINAL HERNIA REPAIR Right 1998  . IR GENERIC HISTORICAL  12/07/2016   IR US GUIDE VASC ACCESS RIGHT 12/07/2016  [place] Edouard, MD [facility]INTER[facility]  . IR GENERIC HISTORICAL  12/07/2016   IR RADIOLOGY PERIPHERAL GUIDED IV START 12/07/2016 [place] Edouard, MD [facility]  . IR GENERIC HISTORICAL  12/07/2016   IR GASTROSTOMY TUBE MOD SED 12/07/2016 [place] Edouard, MD [facility]  . LIPOMA EXCISION Right 2008   "side of my head"  . MITRAL VALVE REPAIR N/A 10/22/2016   Procedure: MITRAL VALVE REPAIR (MVR) WITH SIZE 30 SORIN ANNULOFLEX ANNULOPLASTY RING WITH SUBSEQUENT REMOVAL OF RING;  Surgeon: [place] Alberts, MD;  Location: [place];  Service: Open Heart Surgery;  Laterality: N/A;  . PENECTOMY  2007   Peyronie's disease   . PENILE PROSTHESIS IMPLANT  2009  . REMOVAL OF PENILE PROSTHESIS N/A 02/01/2017   Procedure: REMOVAL OF PENILE PROSTHESIS;  Surgeon: [place] Rhodes, MD;  Location: [place];  Service: Urology;  Laterality: N/A;  . SHOULDER OPEN ROTATOR CUFF REPAIR Right 2006  . STERNAL CLOSURE N/A 10/26/2016   Procedure: STERNAL WASHOUT AND DELAYED PRIMARY CLOSURE;  Surgeon: [place] Alberts, MD;  Location: [place];  Service: Thoracic;  Laterality: N/A;  . TEE WITHOUT CARDIOVERSION N/A 10/26/2016   Procedure: TRANSESOPHAGEAL ECHOCARDIOGRAM (TEE);  Surgeon: [place] Alberts, MD;  Location: [place];  Service: Thoracic;  Laterality: N/A;  . TEE WITHOUT CARDIOVERSION N/A 10/22/2016   Procedure: TRANSESOPHAGEAL ECHOCARDIOGRAM (TEE);  Surgeon: [place] Alberts, MD;  Location: [place];  Service: Open Heart Surgery;  Laterality: N/A;  . THORACIC AORTIC ANEURYSM REPAIR  10/22/2016   Procedure: ASCENDING AORTIC  ANEURYSM REPAIR (AAA) WITH 28 MM HEMASHIELD PLATINUM WOVEN DOUBLE VELOUR VASCULAR GRAFT;  Surgeon: [place] Alberts, MD;  Location: [place];  Service: Open Heart Surgery;;  . TONSILLECTOMY  ~ 1955  . TRACHEOSTOMY TUBE PLACEMENT N/A 11/09/2016   Procedure: TRACHEOSTOMY;  Surgeon: [place] Alberts, MD;  Location: [place];  Service: Thoracic;  Laterality: N/A;  . TRANSURETHRAL RESECTION OF PROSTATE  2005  . TRIGGER FINGER RELEASE  Bilateral    several lt and rt hands  . TRIGGER FINGER RELEASE Right 11/28/2013   Procedure: RIGHT TRIGGER FINGER  RELEASE (TENDON SHEATH INCISION);  Surgeon: [place] Junes, MD;  Location: [place];  Service: Orthopedics;  Laterality: Right;  Marland Kitchen VIDEO BRONCHOSCOPY N/A 11/09/2016   Procedure: VIDEO BRONCHOSCOPY;  Surgeon: [place] Alberts, MD;  Location: [place];  Service: Thoracic;  Laterality: N/A;  . WRIST FRACTURE SURGERY Right ~ 1959    There were no vitals filed for this visit.      Subjective Assessment - 06/23/17 1110    Subjective No new complaints. No falls. Just left from seeing Richardson Landry at [place] where Richardson Landry did blow out the plastic liner to offset pressure on wound on right limb. Also he increased the sock ply on his left leg which has decreased the pressure at the end of the limb.    Patient is accompained by: Family member   Pertinent History arthritis, bicuspid aortic valve, BPH, carpal tunnel syndrome in R wrist, CAD, diverticulitis, GERD, gout, hyperlipidemia,  HTN, DM type II,thrombocytopenia, thoracic ascending aortic aneurysm, severe aortic stenosis, amputation of bilateral hands except right thumb (12/11/2016) due to gangrene, bilateral transtibial amputations (12/11/2016) due to gangrene, R and L heart cath (10/20/2016)    Limitations Standing;Walking;House hold activities   Patient Stated Goals use prostheses to walk including stairs, outdoors, target shooting, drive   Currently in Pain? Yes   Pain [name] 2    Pain Location Knee   Pain Orientation Right   Pain Descriptors / Indicators Sore;Aching   Pain Type Acute pain   Pain Onset More than a month ago   Pain Frequency Constant   Aggravating Factors  pressure from prosthesis   Pain Relieving Factors removing prosthesis            [facility] Adult PT Treatment/Exercise - 06/23/17 1113      Transfers   Transfers Sit to Stand;Stand to Sit   Sit to Stand 5: Supervision;With upper extremity assist;From  chair/3-in-1   Stand to Sit 5: Supervision;With upper extremity assist;To chair/3-in-1     Ambulation/Gait   Ambulation/Gait Yes   Ambulation/Gait Assistance 5: Supervision   Ambulation/Gait Assistance Details cues for walker proximity with gait and for increaed base of support so not to step/trip over feet with gait             Ambulation Distance (Feet) 250 Feet   Assistive device Rolling walker;Prostheses   Gait Pattern Step-through pattern;Decreased stride length;Trunk flexed;Wide base of support   Ambulation Surface Level;Indoor     High Level Balance   High Level Balance Activities Side stepping;Backward walking;Marching forwards   High Level Balance Comments in parallel bars: 5 laps each with light touch on bars as needed for balance. cues on posture and weight shifting .      Knee/Hip Exercises: Standing   Lateral Step Up Both;1 set;10 reps;Hand Hold: 2;Step Height: 6"   Lateral Step Up Limitations cues on technique and weight shifting   Forward Step Up Both;1 set;10 reps;Hand Hold: 2;Step Height: 6";Limitations   Forward Step Up Limitations cues on weight shifting and ex technique     Prosthetics   Prosthetic Care Comments  Has had the changes made to the plastic inner liner and reports less pressure on right limb since then.    Current prosthetic wear tolerance (days/week)  daily    Current prosthetic wear tolerance (#hours/day)  4-5hours on, 2 hours off rotation throughout day   Residual limb condition  right limb had blister  at tibial tuberosity that patient stated was getting worse, muitple other small pressure blisters on both left and right legs   Education Provided Skin check;Residual limb care;Prosthetic cleaning;Proper wear schedule/adjustment;Proper Donning;Correct ply sock adjustment;Other (comment)   Person(s) Educated Patient;Child(ren)   Education Method Explanation;Verbal cues   Education Method Verbalized understanding;Needs further instruction   Donning  Prosthesis Minimal assist   Doffing Prosthesis Minimal assist             PT Short Term Goals - 06/21/17 2101      PT SHORT TERM GOAL #1   Title Patient tolerates bil. LE prostheses wear >75% of awake hours without skin issues.    Time 1   Period Months   Status New   Target Date 07/21/17     PT SHORT TERM GOAL #2   Title Patient performs sit to/from stand from chairs without armrests without UE support to stablize modified independent.    Time 1   Period Months   Status New  Target Date 07/21/17     PT SHORT TERM GOAL #3   Title Patient reaches 10" anteriorly & within 5" of floor without UE support with supervision.    Time 1   Period Months   Status New   Target Date 07/21/17     PT SHORT TERM GOAL #4   Title Patient ambulates 300' with single UE support or less with minA.    Time 1   Period Months   Status New   Target Date 07/21/17     PT SHORT TERM GOAL #5   Title Patient negotiates ramps & curbs with cane or less with bil. prostheses with minA.    Time 1   Period Months   Status New   Target Date 07/21/17           PT Long Term Goals - 06/21/17 2106      PT LONG TERM GOAL #1   Title Patient demonstrates & verbalizes understanding of proper prosthetic care including independent donning to enable safe use of LE prostheses.    Time 2   Period Months   Status Revised   Target Date 08/20/17     PT LONG TERM GOAL #2   Title Patient will tolerate prostheses wear >90% of awake hours without skin issues or limb pain to demonstrate a decrease in his risk of falling.   Time 2   Period Months   Status On-going   Target Date 08/20/17     PT LONG TERM GOAL #3   Title Patient performs standing balance activities with intermittent UE support reaching 10", picking up objects from floor and looks over shoulders with weight shift, trunk rotation with prostheses independently.   Time 4   Period Weeks   Status On-going   Target Date 08/20/17     PT LONG  TERM GOAL #4   Title Patient will ambulate 1000 feet including outdoor surfaces with LRAD and prostheses modified independently to enable community mobility.     Time 4   Period Months   Status On-going   Target Date 08/20/17     PT LONG TERM GOAL #5   Title Patient will negotiate ramp/curbs and stairs with LRAD and prostheses modified independent for community access.    Time 4   Period Months   Status On-going   Target Date 08/20/17     PT LONG TERM GOAL #6   Title Patient's gait velocity will be >/= 1.65ft/s to indicate a limited community ambulator. (Target Date: 08/20/2017)   Time 4   Period Weeks   Status On-going     PT LONG TERM GOAL #7   Title Patient reports Activities of Balance Confindence [name] using FOTO >25% to indicate greater confidence in his balance. (Target Date: 08/20/2017)   Time 4   Period Months   Status On-going            Plan - 06/23/17 1112    Clinical Impression Statement Today's skilled session continued to address mobility with prostheses, dynamic balance and LE strengthening. Pt is making steady progress toward goals and should benefit from continued PT to progress toward unmet goals.   Rehab Potential Good   Clinical Impairments Affecting Rehab Potential arthritis, bicuspid aortic valve, BPH, carpal tunnel syndrome in R wrist, CAD, diverticulitis, GERD, gout, hyperlipidemia, HTN, DM type II,thrombocytopenia, thoracic ascending aortic aneurysm, severe aortic stenosis, amputation of bilateral hands except right thumb (12/11/2016) due to gangrene, bilateral transtibial amputations (12/11/2016) due to gangrene, R and  L heart cath (10/20/2016)    PT Frequency 2x / week   PT Duration Other (comment)  60 days (9 weeks)   PT Treatment/Interventions ADLs/Self Care Home Management;Neuromuscular re-education;Balance training;Therapeutic exercise;Therapeutic activities;Functional mobility training;Stair training;Gait training;DME Instruction;Patient/family  education;Prosthetic Training   PT Next Visit Plan progress prosthetic gait training and barriers with RW; gait training uisng a SPC, progress balance activities including multi direction reaching;    Consulted and Agree with Plan of Care Patient;Family member/caregiver   Family Member Consulted daughter       Patient will benefit from skilled therapeutic intervention in order to improve the following deficits and impairments:  Abnormal gait, Decreased activity tolerance, Decreased balance, Decreased coordination, Decreased range of motion, Decreased mobility, Decreased knowledge of use of DME, Decreased knowledge of precautions, Decreased endurance, Decreased skin integrity, Decreased scar mobility, Decreased strength, Difficulty walking, Postural dysfunction, Prosthetic Dependency  Visit Diagnosis: Unsteadiness on feet  Muscle weakness (generalized)  Other abnormalities of gait and mobility     Problem List Patient Active Problem List   Diagnosis Date Noted  . Amputation of both hands with complication, subsequent encounter 03/01/2017  . Diabetes ([place]) 02/19/2017  . Status post bilateral below knee amputation ([place]) 02/09/2017  . Wound disruption, post-op, skin, sequela 02/09/2017  . Debility 02/05/2017  . Ganglion upper arm, right   . Thrombosis of both upper extremities   . Suspected heparin induced thrombocytopenia (HIT) in hospitalized patient ([place])   . Gangrene of lower extremity ([place])   . Heparin induced thrombocytopenia (HIT) ([place])   . Tracheostomy status ([place])   . Chest tube in place   . Atherosclerosis of native arteries of extremities with gangrene, left leg ([place])   . Atherosclerosis of native arteries of extremities with gangrene, right leg ([place])   . Acute on chronic diastolic CHF (congestive heart failure) ([place])   . Enteritis due to Clostridium difficile   . Anasarca   . FUO (fever of unknown origin)   . Acute encephalopathy   . Elevated LFTs   . DIC  (disseminated intravascular coagulation) ([place]) 10/28/2016  . Postoperative atrial fibrillation ([place]) 10/24/2016  . Cardiogenic shock ([place])   . Mitral regurgitation due to cusp prolapse 10/22/2016  . S/P aortic valve replacement with bioprosthetic valve 10/22/2016  . S/P ascending aortic aneurysm repair 10/22/2016  . S/P mitral valve repair 10/22/2016  . S/P CABG x 3 10/22/2016  . Thrombocytopenia ([place]) 10/22/2016  . Acute combined systolic and diastolic congestive heart failure ([place]) 10/22/2016  . Acute respiratory failure ([place]) 10/22/2016  . Thoracic ascending aortic aneurysm ([place]) 10/21/2016  . Coronary artery disease involving native coronary artery of native heart with unstable angina pectoris ([place]) 10/20/2016  . Severe aortic stenosis by prior echocardiography 10/19/2016  . Unstable angina ([place]) 10/19/2016  . Aortic valve stenosis 10/19/2016  . Bicuspid aortic valve 10/19/2016  . Trigger ring finger of right hand   . Wears glasses   . Gout   . Hypertension   . Carpal tunnel syndrome of right wrist   . Arthritis   . Snores     Willow Ora, [place], [facility] [address], [place], [place] [address] [phone] 06/24/17, 12:41 PM   Name: Scott [name] MRN: [id number] Date of Birth: [birth year]

## 2017-06-28 ENCOUNTER — Encounter: Payer: Self-pay | Admitting: Occupational Therapy

## 2017-06-28 ENCOUNTER — Ambulatory Visit: Payer: Medicare Other | Admitting: Physical Therapy

## 2017-06-28 ENCOUNTER — Ambulatory Visit: Payer: Medicare Other | Admitting: Occupational Therapy

## 2017-06-28 ENCOUNTER — Encounter: Payer: Self-pay | Admitting: Physical Therapy

## 2017-06-28 DIAGNOSIS — R2681 Unsteadiness on feet: Secondary | ICD-10-CM | POA: Diagnosis not present

## 2017-06-28 DIAGNOSIS — R2689 Other abnormalities of gait and mobility: Secondary | ICD-10-CM | POA: Diagnosis not present

## 2017-06-28 DIAGNOSIS — R293 Abnormal posture: Secondary | ICD-10-CM | POA: Diagnosis not present

## 2017-06-28 DIAGNOSIS — R29898 Other symptoms and signs involving the musculoskeletal system: Secondary | ICD-10-CM | POA: Diagnosis not present

## 2017-06-28 DIAGNOSIS — M6281 Muscle weakness (generalized): Secondary | ICD-10-CM | POA: Diagnosis not present

## 2017-06-28 DIAGNOSIS — M79641 Pain in right hand: Secondary | ICD-10-CM

## 2017-06-28 DIAGNOSIS — M79642 Pain in left hand: Secondary | ICD-10-CM

## 2017-06-28 DIAGNOSIS — R208 Other disturbances of skin sensation: Secondary | ICD-10-CM

## 2017-06-28 NOTE — Therapy (Signed)
[facility] [address] [place], [place], [address] Phone: [phone]   Fax:  [phone]  Occupational Therapy Treatment  Patient Details  Name: Scott [name] MRN: [id number] Date of Birth: 05-03-1946 Referring Provider: Dr. Meridee Score  Encounter Date: 06/28/2017      OT End of Session - 06/28/17 1257    Visit Number 9   Number of Visits 16   Date for OT Re-Evaluation 07/21/17   Authorization Type medicare pt will need G code and PN every 10th visit   Authorization Time Period 60 days   Authorization - Visit Number 9   Authorization - Number of Visits 10   OT Start Time 3151   OT Stop Time 1233   OT Time Calculation (min) 48 min   Activity Tolerance Patient tolerated treatment well   Behavior During Therapy [facility] for tasks assessed/performed      Past Medical History:  Diagnosis Date  . Arthritis    "hips, shoulders; knees; back" (10/20/2016)  . Bicuspid aortic valve   . BPH (benign prostatic hypertrophy)   . Carpal tunnel syndrome of right wrist   . Coronary artery disease involving native coronary artery of native heart with unstable angina pectoris ([place]) 10/20/2016  . Diverticulitis   . GERD (gastroesophageal reflux disease)   . Gout   . Heart murmur   . Hyperlipemia   . Hypertension   . Postoperative atrial fibrillation ([place]) 10/24/2016  . RLS (restless legs syndrome)   . S/P aortic valve replacement with bioprosthetic valve 10/22/2016   25 mm [employer] bovine pericardial bioprosthetic tissue valve  . S/P ascending aortic aneurysm repair 10/22/2016   28 mm supracoronary straight graft replacement of ascending thoracic aortic aneurysm  . S/P CABG x 3 10/22/2016   Sequential LIMA to Diag and LAD, SVG to distal LAD, open vein harvest right thigh  . S/P mitral valve repair 10/22/2016   Artificial Gore-tex neochord placement x6 - posterior annuloplasty band placed but removed due to systolic  anterior motion of mitral valve  . Severe aortic stenosis   . Snores    Never been tested for sleep apnea  . Thoracic ascending aortic aneurysm ([place]) 10/21/2016  . Thrombocytopenia ([place]) 10/22/2016  . Type II diabetes mellitus ([place])     Past Surgical History:  Procedure Laterality Date  . AMPUTATION Bilateral 12/11/2016   Procedure: AMPUTATION BELOW KNEE BILATERALLY;  Surgeon: [name], MD;  Location: [facility];  Service: Orthopedics;  Laterality: Bilateral;  . AMPUTATION Bilateral 12/11/2016   Procedure: AMPUTATION BILATERAL HANDS EXCEPT RIGHT THUMB;  Surgeon: [name], MD;  Location: [facility];  Service: Orthopedics;  Laterality: Bilateral;  . AORTIC VALVE REPLACEMENT N/A 10/22/2016   Procedure: AORTIC VALVE REPLACEMENT (AVR) WITH SIZE 25 MM MAGNA EASE PERICARDIAL BIOPROSTHESIS - AORTIC;  Surgeon: Rexene Alberts, MD;  Location: [facility];  Service: Open Heart Surgery;  Laterality: N/A;  . BONE EXCISION Right 02/01/2017   Procedure: EXCISION RIGHT INDEX METACARPAL HEAD;  Surgeon: [name], MD;  Location: [facility];  Service: Orthopedics;  Laterality: Right;  . CARDIAC CATHETERIZATION N/A 10/20/2016   Procedure: Right/Left Heart Cath and Coronary Angiography;  Surgeon: Leonie Man, MD;  Location: [facility];  Service: Cardiovascular;  Laterality: N/A;  . CARPAL TUNNEL RELEASE Right 11/28/2013   Procedure: RIGHT WRIST CARPAL TUNNEL RELEASE;  Surgeon: Lorn Junes, MD;  Location: [facility];  Service: Orthopedics;  Laterality: Right;  .  CATARACT EXTRACTION W/ INTRAOCULAR LENS  IMPLANT, BILATERAL Bilateral 1978  . COLONOSCOPY    . CORONARY ARTERY BYPASS GRAFT N/A 10/22/2016   Procedure: CORONARY ARTERY BYPASS GRAFTING (CABG)x 2 WITH LIMA TO DIAGONAL, OPEN  HARVESTING OF RIGHT SAPHENOUS VEIN FOR VEIN GRAFT TO LAD;  Surgeon: Rexene Alberts, MD;  Location: [facility];  Service: Open Heart Surgery;  Laterality: N/A;  . FRACTURE SURGERY    . INGUINAL HERNIA REPAIR Right 1998   . IR GENERIC HISTORICAL  12/07/2016   IR US GUIDE VASC ACCESS RIGHT 12/07/2016 Aletta Edouard, MD [facility]  . IR GENERIC HISTORICAL  12/07/2016   IR RADIOLOGY PERIPHERAL GUIDED IV START 12/07/2016 Aletta Edouard, MD [facility]  . IR GENERIC HISTORICAL  12/07/2016   IR GASTROSTOMY TUBE MOD SED 12/07/2016 Aletta Edouard, MD [facility]  . LIPOMA EXCISION Right 2008   "side of my head"  . MITRAL VALVE REPAIR N/A 10/22/2016   Procedure: MITRAL VALVE REPAIR (MVR) WITH SIZE 30 SORIN ANNULOFLEX ANNULOPLASTY RING WITH SUBSEQUENT REMOVAL OF RING;  Surgeon: Rexene Alberts, MD;  Location: [facility];  Service: Open Heart Surgery;  Laterality: N/A;  . PENECTOMY  2007   Peyronie's disease   . PENILE PROSTHESIS IMPLANT  2009  . REMOVAL OF PENILE PROSTHESIS N/A 02/01/2017   Procedure: REMOVAL OF PENILE PROSTHESIS;  Surgeon: Kathie Rhodes, MD;  Location: [facility];  Service: Urology;  Laterality: N/A;  . SHOULDER OPEN ROTATOR CUFF REPAIR Right 2006  . STERNAL CLOSURE N/A 10/26/2016   Procedure: STERNAL WASHOUT AND DELAYED PRIMARY CLOSURE;  Surgeon: Rexene Alberts, MD;  Location: [facility];  Service: Thoracic;  Laterality: N/A;  . TEE WITHOUT CARDIOVERSION N/A 10/26/2016   Procedure: TRANSESOPHAGEAL ECHOCARDIOGRAM (TEE);  Surgeon: Rexene Alberts, MD;  Location: [facility];  Service: Thoracic;  Laterality: N/A;  . TEE WITHOUT CARDIOVERSION N/A 10/22/2016   Procedure: TRANSESOPHAGEAL ECHOCARDIOGRAM (TEE);  Surgeon: Rexene Alberts, MD;  Location: [facility];  Service: Open Heart Surgery;  Laterality: N/A;  . THORACIC AORTIC ANEURYSM REPAIR  10/22/2016   Procedure: ASCENDING AORTIC  ANEURYSM REPAIR (AAA) WITH 28 MM HEMASHIELD PLATINUM WOVEN DOUBLE VELOUR VASCULAR GRAFT;  Surgeon: Rexene Alberts, MD;  Location: [facility];  Service: Open Heart Surgery;;  . TONSILLECTOMY  ~ 1955  . TRACHEOSTOMY TUBE PLACEMENT N/A 11/09/2016   Procedure: TRACHEOSTOMY;  Surgeon: Rexene Alberts, MD;  Location: [facility];  Service: Thoracic;  Laterality:  N/A;  . TRANSURETHRAL RESECTION OF PROSTATE  2005  . TRIGGER FINGER RELEASE Bilateral    several lt and rt hands  . TRIGGER FINGER RELEASE Right 11/28/2013   Procedure: RIGHT TRIGGER FINGER  RELEASE (TENDON SHEATH INCISION);  Surgeon: Lorn Junes, MD;  Location: [facility];  Service: Orthopedics;  Laterality: Right;  Marland Kitchen VIDEO BRONCHOSCOPY N/A 11/09/2016   Procedure: VIDEO BRONCHOSCOPY;  Surgeon: Rexene Alberts, MD;  Location: [facility];  Service: Thoracic;  Laterality: N/A;  . WRIST FRACTURE SURGERY Right ~ 1959    There were no vitals filed for this visit.      Subjective Assessment - 06/28/17 1246    Subjective  I put it on to practice moving blocks.  (left prosthesis)   Patient is accompained by: Family member   Pertinent History see epic. Pt with Bil BKA as well as bilateral UE amputations of all fingers excepet L thumb at MCP joint due to gangrene.    Patient Stated Goals walk, drive a car and go where  I want to go. Wife hopes pt can go out to eat in a restaurant and use public bathroom and eat .    Currently in Pain? No/denies   Pain Score 0-No pain                      OT Treatments/Exercises (OP) - 06/28/17 0001      ADLs   Functional Mobility Patient is beginning to walk with a cane in PT.  Patient would have more options for UE use if not walking with a bilateral device (walker.)  Practiced wearing left UE prosthesis and opening, closing lightweight door with handle.  Patient needs min to mod assist for balance with cane, and he is not yet confident with this device, would benefit from additional UE training up in standing.     Home Maintenance Worked with opposition brace, and left UE prosthesis (both hook and claw) to address patient's ability to manipulate functional objects.  Pick up varying sizes and weights, transport objects across tabletop while seated, and manipulate smaller objects bilaterally - nuts and bolts.  Worked with patient to  attempt to use a wrench to loosen / tighten nut/bolt.  Patient practiced various techniques to determine most effective method.     ADL Comments Patient able to don prosthesis with hook attachment.  Patient unable to adjust shoulder tension straps, patient needed cueing to locate DRing to re-strap forearm/hand strap.  Patient needs assistance to change out hook for claw (this is a two handed process)                 OT Education - 06/28/17 1256    Education provided Yes   Education Details Use of hook versus claw device - one defaults to closed position, the other to open position   Person(s) Educated Patient;Child(ren)   Methods Explanation;Demonstration   Comprehension Verbalized understanding;Returned demonstration;Need further instruction          OT Short Term Goals - 06/23/17 1238      OT SHORT TERM GOAL #1   Title PT and wife will be mod I with home activities program - 06/14/2017   Status Achieved     OT SHORT TERM GOAL #2   Title Pt will be supervision for 3 in 1 commode transfers   Status Achieved     OT SHORT TERM GOAL #3   Title Pt will demonstrate ability to use opposition splint with prn with basic ADL tasks   Status Achieved     OT SHORT TERM GOAL #4   Title Pt will be able to complete simple cold snack prep with min a   Status Achieved           OT Long Term Goals - 06/23/17 1238      OT LONG TERM GOAL #1   Title Pt will be mod I with home activities program - 07/21/2017   Status On-going     OT LONG TERM GOAL #2   Title Pt will be mod I with toilet transfers   Status Achieved     OT LONG TERM GOAL #3   Title IF pt is able to complete stairs, pt will be mod I with tub bench transfers   Status On-going     OT LONG TERM GOAL #4   Title Pt will verbalize understanding of driving eval information   Status Achieved     OT LONG TERM GOAL #5   Title Pt will be mod  I with toilet hygiene with AE   Status Achieved     OT LONG TERM GOAL #6    Title Pt will be mod I with buttoning, zipping using AE/prostheses   Status Achieved     OT LONG TERM GOAL #7   Title Pt will be mod I with cutting food with AE/prostheses   Status Achieved     OT LONG TERM GOAL #8   Title Pt will be able to write at 3 sentence level with 100% legibility with AE.    Status On-going     OT LONG TERM GOAL  #9   Baseline Pt will demonstrate ability to use LUE prosthesis as stabilizer during simple functional tasks   Status New     OT LONG TERM GOAL  #10   TITLE Pt will be mod I with donning and doffing LUE prosthesis.   Status New               Plan - 06/28/17 1257    Clinical Impression Statement Patient continues to show excellent progress toward OT goals.  Patient is beginning to gain comfort and tolerance to LUE prosthesis.     Rehab Potential Good   OT Frequency 2x / week   OT Duration 8 weeks   OT Treatment/Interventions Self-care/ADL training;Moist Heat;Cryotherapy;Ultrasound;Fluidtherapy;DME and/or AE instruction;Neuromuscular education;Therapeutic exercise;Therapist, nutritional;Therapeutic activities;Splinting;Patient/family education;Balance training   Plan modify shower head if patiet brings in, try walking with wheelchair glove and strap for cane - with LUE prosthesis   Consulted and Agree with Plan of Care Patient   Family Member Consulted dtr      Patient will benefit from skilled therapeutic intervention in order to improve the following deficits and impairments:  Abnormal gait, Decreased activity tolerance, Decreased balance, Decreased mobility, Decreased knowledge of use of DME, Difficulty walking, Impaired UE functional use, Impaired sensation, Pain, Other (comment)  Visit Diagnosis: Unsteadiness on feet  Other symptoms and signs involving the musculoskeletal system  Muscle weakness (generalized)  Pain in right hand  Pain in left hand  Other disturbances of skin sensation    Problem List Patient Active  Problem List   Diagnosis Date Noted  . Amputation of both hands with complication, subsequent encounter 03/01/2017  . Diabetes ([place]) 02/19/2017  . Status post bilateral below knee amputation ([place]) 02/09/2017  . Wound disruption, post-op, skin, sequela 02/09/2017  . Debility 02/05/2017  . Ganglion upper arm, right   . Thrombosis of both upper extremities   . Suspected heparin induced thrombocytopenia (HIT) in hospitalized patient ([place])   . Gangrene of lower extremity ([place])   . Heparin induced thrombocytopenia (HIT) ([place])   . Tracheostomy status ([place])   . Chest tube in place   . Atherosclerosis of native arteries of extremities with gangrene, left leg ([place])   . Atherosclerosis of native arteries of extremities with gangrene, right leg ([place])   . Acute on chronic diastolic CHF (congestive heart failure) ([place])   . Enteritis due to Clostridium difficile   . Anasarca   . FUO (fever of unknown origin)   . Acute encephalopathy   . Elevated LFTs   . DIC (disseminated intravascular coagulation) ([place]) 10/28/2016  . Postoperative atrial fibrillation ([place]) 10/24/2016  . Cardiogenic shock ([place])   . Mitral regurgitation due to cusp prolapse 10/22/2016  . S/P aortic valve replacement with bioprosthetic valve 10/22/2016  . S/P ascending aortic aneurysm repair 10/22/2016  . S/P mitral valve repair 10/22/2016  . S/P CABG x 3 10/22/2016  .  Thrombocytopenia ([place]) 10/22/2016  . Acute combined systolic and diastolic congestive heart failure ([place]) 10/22/2016  . Acute respiratory failure ([place]) 10/22/2016  . Thoracic ascending aortic aneurysm ([place]) 10/21/2016  . Coronary artery disease involving native coronary artery of native heart with unstable angina pectoris ([place]) 10/20/2016  . Severe aortic stenosis by prior echocardiography 10/19/2016  . Unstable angina ([place]) 10/19/2016  . Aortic valve stenosis 10/19/2016  . Bicuspid aortic valve 10/19/2016  . Trigger ring finger of right hand   . Wears glasses    . Gout   . Hypertension   . Carpal tunnel syndrome of right wrist   . Arthritis   . Snores     Mariah Milling , OTR/L 06/28/2017, 1:00 PM  [facility] [address] [place], [place], [address] Phone: [phone]   Fax:  [phone]  Name: Scott [name] MRN: [id number] Date of Birth: [birth year]

## 2017-06-28 NOTE — Therapy (Signed)
Scott [name] [address] [place], [place], [address] Phone: [phone]   Fax:  [phone]  Physical Therapy Treatment  Patient Details  Name: Scott [name] MRN: [id number] Date of Birth: 1946-10-04 Referring Provider: Meridee Score MD   Encounter Date: 06/28/2017      PT End of Session - 06/28/17 1106    Visit Number 18   Number of Visits 33   Date for PT Re-Evaluation 08/20/17   Authorization Type Medicare & G-codes every 10th visit    PT Start Time 1100   PT Stop Time 1144   PT Time Calculation (min) 44 min   Equipment Utilized During Treatment Gait belt   Activity Tolerance Patient tolerated treatment well   Behavior During Therapy [facility] for tasks assessed/performed      Past Medical History:  Diagnosis Date  . Arthritis    "hips, shoulders; knees; back" (10/20/2016)  . Bicuspid aortic valve   . BPH (benign prostatic hypertrophy)   . Carpal tunnel syndrome of right wrist   . Coronary artery disease involving native coronary artery of native heart with unstable angina pectoris ([place]) 10/20/2016  . Diverticulitis   . GERD (gastroesophageal reflux disease)   . Gout   . Heart murmur   . Hyperlipemia   . Hypertension   . Postoperative atrial fibrillation ([place]) 10/24/2016  . RLS (restless legs syndrome)   . S/P aortic valve replacement with bioprosthetic valve 10/22/2016   25 mm [facility] Ease bovine pericardial bioprosthetic tissue valve  . S/P ascending aortic aneurysm repair 10/22/2016   28 mm supracoronary straight graft replacement of ascending thoracic aortic aneurysm  . S/P CABG x 3 10/22/2016   Sequential LIMA to Diag and LAD, SVG to distal LAD, open vein harvest right thigh  . S/P mitral valve repair 10/22/2016   Artificial Gore-tex neochord placement x6 - posterior annuloplasty band placed but removed due to systolic anterior motion of mitral valve  . Severe aortic stenosis   . Snores    Never  been tested for sleep apnea  . Thoracic ascending aortic aneurysm ([place]) 10/21/2016  . Thrombocytopenia ([place]) 10/22/2016  . Type II diabetes mellitus ([place])     Past Surgical History:  Procedure Laterality Date  . AMPUTATION Bilateral 12/11/2016   Procedure: AMPUTATION BELOW KNEE BILATERALLY;  Surgeon: Scott Minion, MD;  Location: [place];  Service: Orthopedics;  Laterality: Bilateral;  . AMPUTATION Bilateral 12/11/2016   Procedure: AMPUTATION BILATERAL HANDS EXCEPT RIGHT THUMB;  Surgeon: Scott Minion, MD;  Location: [place];  Service: Orthopedics;  Laterality: Bilateral;  . AORTIC VALVE REPLACEMENT N/A 10/22/2016   Procedure: AORTIC VALVE REPLACEMENT (AVR) WITH SIZE 25 MM MAGNA EASE PERICARDIAL BIOPROSTHESIS - AORTIC;  Surgeon: Scott Alberts, MD;  Location: [place];  Service: Open Heart Surgery;  Laterality: N/A;  . BONE EXCISION Right 02/01/2017   Procedure: EXCISION RIGHT INDEX METACARPAL HEAD;  Surgeon: Scott Minion, MD;  Location: [place];  Service: Orthopedics;  Laterality: Right;  . CARDIAC CATHETERIZATION N/A 10/20/2016   Procedure: Right/Left Heart Cath and Coronary Angiography;  Surgeon: Scott Man, MD;  Location: [place] [facility];  Service: Cardiovascular;  Laterality: N/A;  . CARPAL TUNNEL RELEASE Right 11/28/2013   Procedure: RIGHT WRIST CARPAL TUNNEL RELEASE;  Surgeon: Scott Junes, MD;  Location: [place];  Service: Orthopedics;  Laterality: Right;  . CATARACT EXTRACTION W/ INTRAOCULAR LENS  IMPLANT, BILATERAL Bilateral 1978  . COLONOSCOPY    .  CORONARY ARTERY BYPASS GRAFT N/A 10/22/2016   Procedure: CORONARY ARTERY BYPASS GRAFTING (CABG)x 2 WITH LIMA TO DIAGONAL, OPEN  HARVESTING OF RIGHT SAPHENOUS VEIN FOR VEIN GRAFT TO LAD;  Surgeon: Scott Alberts, MD;  Location: [place];  Service: Open Heart Surgery;  Laterality: N/A;  . FRACTURE SURGERY    . INGUINAL HERNIA REPAIR Right 1998  . IR GENERIC HISTORICAL  12/07/2016   IR US GUIDE VASC ACCESS RIGHT 12/07/2016  Scott Edouard, MD [facility]  . IR GENERIC HISTORICAL  12/07/2016   IR RADIOLOGY PERIPHERAL GUIDED IV START 12/07/2016 Scott Edouard, MD [facility]  . IR GENERIC HISTORICAL  12/07/2016   IR GASTROSTOMY TUBE MOD SED 12/07/2016 Scott Edouard, MD [facility]  . LIPOMA EXCISION Right 2008   "side of my head"  . MITRAL VALVE REPAIR N/A 10/22/2016   Procedure: MITRAL VALVE REPAIR (MVR) WITH SIZE 30 SORIN ANNULOFLEX ANNULOPLASTY RING WITH SUBSEQUENT REMOVAL OF RING;  Surgeon: Scott Alberts, MD;  Location: [place];  Service: Open Heart Surgery;  Laterality: N/A;  . PENECTOMY  2007   Peyronie's disease   . PENILE PROSTHESIS IMPLANT  2009  . REMOVAL OF PENILE PROSTHESIS N/A 02/01/2017   Procedure: REMOVAL OF PENILE PROSTHESIS;  Surgeon: Scott Rhodes, MD;  Location: [place];  Service: Urology;  Laterality: N/A;  . SHOULDER OPEN ROTATOR CUFF REPAIR Right 2006  . STERNAL CLOSURE N/A 10/26/2016   Procedure: STERNAL WASHOUT AND DELAYED PRIMARY CLOSURE;  Surgeon: Scott Alberts, MD;  Location: [place];  Service: Thoracic;  Laterality: N/A;  . TEE WITHOUT CARDIOVERSION N/A 10/26/2016   Procedure: TRANSESOPHAGEAL ECHOCARDIOGRAM (TEE);  Surgeon: Scott Alberts, MD;  Location: [place];  Service: Thoracic;  Laterality: N/A;  . TEE WITHOUT CARDIOVERSION N/A 10/22/2016   Procedure: TRANSESOPHAGEAL ECHOCARDIOGRAM (TEE);  Surgeon: Scott Alberts, MD;  Location: [place];  Service: Open Heart Surgery;  Laterality: N/A;  . THORACIC AORTIC ANEURYSM REPAIR  10/22/2016   Procedure: ASCENDING AORTIC  ANEURYSM REPAIR (AAA) WITH 28 MM HEMASHIELD PLATINUM WOVEN DOUBLE VELOUR VASCULAR GRAFT;  Surgeon: Scott Alberts, MD;  Location: [place];  Service: Open Heart Surgery;;  . TONSILLECTOMY  ~ 1955  . TRACHEOSTOMY TUBE PLACEMENT N/A 11/09/2016   Procedure: TRACHEOSTOMY;  Surgeon: Scott Alberts, MD;  Location: [place];  Service: Thoracic;  Laterality: N/A;  . TRANSURETHRAL RESECTION OF PROSTATE  2005  . TRIGGER FINGER RELEASE  Bilateral    several lt and rt hands  . TRIGGER FINGER RELEASE Right 11/28/2013   Procedure: RIGHT TRIGGER FINGER  RELEASE (TENDON SHEATH INCISION);  Surgeon: Scott Junes, MD;  Location: [place];  Service: Orthopedics;  Laterality: Right;  Marland Kitchen VIDEO BRONCHOSCOPY N/A 11/09/2016   Procedure: VIDEO BRONCHOSCOPY;  Surgeon: Scott Alberts, MD;  Location: [place];  Service: Thoracic;  Laterality: N/A;  . WRIST FRACTURE SURGERY Right ~ 1959    There were no vitals filed for this visit.      Subjective Assessment - 06/28/17 1103    Subjective No new complaints. No falls. Pressure sores still present but pt reports they appear to be healing. Reports wearing prostheses for 5 hours on/1 hour off.   Patient is accompained by: Family member   Pertinent History arthritis, bicuspid aortic valve, BPH, carpal tunnel syndrome in R wrist, CAD, diverticulitis, GERD, gout, hyperlipidemia, HTN, DM type II,thrombocytopenia, thoracic ascending aortic aneurysm, severe aortic stenosis, amputation of bilateral hands except right thumb (12/11/2016) due to gangrene, bilateral transtibial  amputations (12/11/2016) due to gangrene, R and L heart cath (10/20/2016)    Limitations Standing;Walking;House hold activities   Patient Stated Goals use prostheses to walk including stairs, outdoors, target shooting, drive   Currently in Pain? No/denies   Pain Onset More than a month ago                         [facility] Adult PT Treatment/Exercise - 06/28/17 1401      Transfers   Transfers Sit to Stand   Sit to Stand 5: Supervision;With upper extremity assist;With armrests;From chair/3-in-1  UE assist with cane   Sit to Stand Details Verbal cues for technique;Verbal cues for sequencing;Verbal cues for safe use of DME/AE   Stand to Sit 5: Supervision;With upper extremity assist;With armrests;To chair/3-in-1  UE assist with cane   Stand to Sit Details (indicate cue type and reason) Verbal cues for  technique;Verbal cues for sequencing;Verbal cues for safe use of DME/AE     Ambulation/Gait   Ambulation/Gait Yes   Ambulation/Gait Assistance 4: Min assist;3: Mod assist  ModA with balance losses   Ambulation/Gait Assistance Details PT provided verbal, tactile/manual cues for use of cane, step length on turns   Ambulation Distance (Feet) 125 Feet  125' X 2   Assistive device Straight cane;Prostheses  Coban used to secure R hand to cane handle   Gait Pattern Step-to pattern;Decreased stride length;Decreased step length - right;Decreased arm swing - left;Trunk flexed   Ambulation Surface Level;Indoor     High Level Balance   High Level Balance Activities Negotitating around obstacles;Negotiating over obstacles   High Level Balance Comments SPC and mod A of PT to maintain balance and prevent falls; PT provided verbal cues for proper use of cane and step length     Prosthetics   Prosthetic Care Comments  changing shoes & heel ht with prostheses; use of vacuum seal bag for water activities.    Current prosthetic wear tolerance (days/week)  daily    Current prosthetic wear tolerance (#hours/day)  5hrs on, 1-2 hrs off, 2x/day   Residual limb condition  right limb wound 67mm at tibial tuberosity covered with Tegaderm.   Education Provided Skin check;Residual limb care;Proper wear schedule/adjustment;Other (comment)  see prosthetic care comments   Person(s) Educated Patient;Child(ren)   Education Method Explanation;Verbal cues;Demonstration   Education Method Verbalized understanding;Returned demonstration;Verbal cues required;Needs further instruction   Donning Prosthesis Supervision   Doffing Prosthesis Supervision                PT Education - 06/28/17 1105    Education provided Yes   Education Details possibility of use of gloves to aide grip with cane instead of Coban. Pt to bring his wt lifting gloves next session.    Person(s) Educated Patient;Child(ren)   Methods  Explanation;Demonstration   Comprehension Verbalized understanding;Need further instruction          PT Short Term Goals - 06/28/17 2212      PT SHORT TERM GOAL #1   Title Patient tolerates bil. LE prostheses wear >75% of awake hours without skin issues.    Time 1   Period Months   Status On-going   Target Date 07/21/17     PT SHORT TERM GOAL #2   Title Patient performs sit to/from stand from chairs without armrests without UE support to stablize modified independent.    Time 1   Period Months   Status On-going   Target Date 07/21/17  PT SHORT TERM GOAL #3   Title Patient reaches 10" anteriorly & within 5" of floor without UE support with supervision.    Time 1   Period Months   Status On-going   Target Date 07/21/17     PT SHORT TERM GOAL #4   Title Patient ambulates 300' with single UE support or less with minA.    Time 1   Period Months   Status On-going   Target Date 07/21/17     PT SHORT TERM GOAL #5   Title Patient negotiates ramps & curbs with cane or less with bil. prostheses with minA.    Time 1   Period Months   Status On-going   Target Date 07/21/17           PT Long Term Goals - 06/21/17 2106      PT LONG TERM GOAL #1   Title Patient demonstrates & verbalizes understanding of proper prosthetic care including independent donning to enable safe use of LE prostheses.    Time 2   Period Months   Status Revised   Target Date 08/20/17     PT LONG TERM GOAL #2   Title Patient will tolerate prostheses wear >90% of awake hours without skin issues or limb pain to demonstrate a decrease in his risk of falling.   Time 2   Period Months   Status On-going   Target Date 08/20/17     PT LONG TERM GOAL #3   Title Patient performs standing balance activities with intermittent UE support reaching 10", picking up objects from floor and looks over shoulders with weight shift, trunk rotation with prostheses independently.   Time 4   Period Weeks   Status  On-going   Target Date 08/20/17     PT LONG TERM GOAL #4   Title Patient will ambulate 1000 feet including outdoor surfaces with LRAD and prostheses modified independently to enable community mobility.     Time 4   Period Months   Status On-going   Target Date 08/20/17     PT LONG TERM GOAL #5   Title Patient will negotiate ramp/curbs and stairs with LRAD and prostheses modified independent for community access.    Time 4   Period Months   Status On-going   Target Date 08/20/17     PT LONG TERM GOAL #6   Title Patient's gait velocity will be >/= 1.52ft/s to indicate a limited community ambulator. (Target Date: 08/20/2017)   Time 4   Period Weeks   Status On-going     PT LONG TERM GOAL #7   Title Patient reports Activities of Balance Confindence score using FOTO >25% to indicate greater confidence in his balance. (Target Date: 08/20/2017)   Time 4   Period Months   Status On-going               Plan - 06/28/17 1131    Clinical Impression Statement Today's session addressed gait with single point cane Coban taped for grip on level surfaces, around and over obstacles. Pt requires min to modA from PT to prevent LOB and verbal, manual cues from PT to manage cane, balance reactions and for appropriate step length along curved paths. Pt will continue to benefit from skilled therapy to progress toward goals.   Rehab Potential Good   Clinical Impairments Affecting Rehab Potential arthritis, bicuspid aortic valve, BPH, carpal tunnel syndrome in R wrist, CAD, diverticulitis, GERD, gout, hyperlipidemia, HTN, DM type II,thrombocytopenia, thoracic ascending aortic  aneurysm, severe aortic stenosis, amputation of bilateral hands except right thumb (12/11/2016) due to gangrene, bilateral transtibial amputations (12/11/2016) due to gangrene, R and L heart cath (10/20/2016)    PT Frequency 2x / week   PT Duration Other (comment)  60 days (9 weeks)   PT Treatment/Interventions ADLs/Self Care  Home Management;Neuromuscular re-education;Balance training;Therapeutic exercise;Therapeutic activities;Functional mobility training;Stair training;Gait training;DME Instruction;Patient/family education;Prosthetic Training   PT Next Visit Plan Progress prosthetic gait training and barriers with SPC, progress balance activities including multi direction reaching   Consulted and Agree with Plan of Care Patient;Family member/caregiver   Family Member Consulted daughter       Patient will benefit from skilled therapeutic intervention in order to improve the following deficits and impairments:  Abnormal gait, Decreased activity tolerance, Decreased balance, Decreased coordination, Decreased range of motion, Decreased mobility, Decreased knowledge of use of DME, Decreased knowledge of precautions, Decreased endurance, Decreased skin integrity, Decreased scar mobility, Decreased strength, Difficulty walking, Postural dysfunction, Prosthetic Dependency  Visit Diagnosis: Unsteadiness on feet  Muscle weakness (generalized)  Other abnormalities of gait and mobility     Problem List Patient Active Problem List   Diagnosis Date Noted  . Amputation of both hands with complication, subsequent encounter 03/01/2017  . Diabetes ([place]) 02/19/2017  . Status post bilateral below knee amputation ([place]) 02/09/2017  . Wound disruption, post-op, skin, sequela 02/09/2017  . Debility 02/05/2017  . Ganglion upper arm, right   . Thrombosis of both upper extremities   . Suspected heparin induced thrombocytopenia (HIT) in hospitalized patient ([place])   . Gangrene of lower extremity ([place])   . Heparin induced thrombocytopenia (HIT) ([place])   . Tracheostomy status ([place])   . Chest tube in place   . Atherosclerosis of native arteries of extremities with gangrene, left leg ([place])   . Atherosclerosis of native arteries of extremities with gangrene, right leg ([place])   . Acute on chronic diastolic CHF (congestive heart failure)  ([place])   . Enteritis due to Clostridium difficile   . Anasarca   . FUO (fever of unknown origin)   . Acute encephalopathy   . Elevated LFTs   . DIC (disseminated intravascular coagulation) ([place]) 10/28/2016  . Postoperative atrial fibrillation ([place]) 10/24/2016  . Cardiogenic shock ([place])   . Mitral regurgitation due to cusp prolapse 10/22/2016  . S/P aortic valve replacement with bioprosthetic valve 10/22/2016  . S/P ascending aortic aneurysm repair 10/22/2016  . S/P mitral valve repair 10/22/2016  . S/P CABG x 3 10/22/2016  . Thrombocytopenia ([place]) 10/22/2016  . Acute combined systolic and diastolic congestive heart failure ([place]) 10/22/2016  . Acute respiratory failure ([place]) 10/22/2016  . Thoracic ascending aortic aneurysm ([place]) 10/21/2016  . Coronary artery disease involving native coronary artery of native heart with unstable angina pectoris ([place]) 10/20/2016  . Severe aortic stenosis by prior echocardiography 10/19/2016  . Unstable angina ([place]) 10/19/2016  . Aortic valve stenosis 10/19/2016  . Bicuspid aortic valve 10/19/2016  . Trigger ring finger of right hand   . Wears glasses   . Gout   . Hypertension   . Carpal tunnel syndrome of right wrist   . Arthritis   . Snores    Gershon Crane, SPT 06/28/2017, 2:222 PM  Jamey Reas, PT, DPT 06/28/2017, 10:16 PM  [facility] [address] [place], [place], [address] Phone: [phone]   Fax:  [phone]  Name: BRITTAN MAPEL MRN: [id number] Date of Birth: 1946/04/03

## 2017-06-30 ENCOUNTER — Encounter: Payer: Self-pay | Admitting: Physical Therapy

## 2017-06-30 ENCOUNTER — Ambulatory Visit: Payer: Medicare Other | Admitting: Physical Therapy

## 2017-06-30 ENCOUNTER — Encounter: Payer: Self-pay | Admitting: Occupational Therapy

## 2017-06-30 ENCOUNTER — Ambulatory Visit: Payer: Medicare Other | Admitting: Occupational Therapy

## 2017-06-30 DIAGNOSIS — M6281 Muscle weakness (generalized): Secondary | ICD-10-CM | POA: Diagnosis not present

## 2017-06-30 DIAGNOSIS — R2689 Other abnormalities of gait and mobility: Secondary | ICD-10-CM

## 2017-06-30 DIAGNOSIS — M79641 Pain in right hand: Secondary | ICD-10-CM | POA: Diagnosis not present

## 2017-06-30 DIAGNOSIS — R293 Abnormal posture: Secondary | ICD-10-CM | POA: Diagnosis not present

## 2017-06-30 DIAGNOSIS — R2681 Unsteadiness on feet: Secondary | ICD-10-CM | POA: Diagnosis not present

## 2017-06-30 DIAGNOSIS — R208 Other disturbances of skin sensation: Secondary | ICD-10-CM

## 2017-06-30 DIAGNOSIS — M79642 Pain in left hand: Secondary | ICD-10-CM

## 2017-06-30 DIAGNOSIS — R29898 Other symptoms and signs involving the musculoskeletal system: Secondary | ICD-10-CM

## 2017-06-30 NOTE — Therapy (Signed)
[facility] [address] [place], [place], [address] Phone: [phone]   Fax:  [phone]  Physical Therapy Treatment  Patient Details  Name: Scott [name] MRN: [id number] Date of Birth: 1946-01-22 Referring Provider: Meridee Score MD   Encounter Date: 06/30/2017      PT End of Session - 06/30/17 1114    Visit Number 19   Number of Visits 33   Date for PT Re-Evaluation 08/20/17   Authorization Type Medicare & G-codes every 10th visit    PT Start Time 1104   PT Stop Time 1146   PT Time Calculation (min) 42 min   Equipment Utilized During Treatment Gait belt  SPC   Activity Tolerance Patient tolerated treatment well   Behavior During Therapy [facility] for tasks assessed/performed      Past Medical History:  Diagnosis Date  . Arthritis    "hips, shoulders; knees; back" (10/20/2016)  . Bicuspid aortic valve   . BPH (benign prostatic hypertrophy)   . Carpal tunnel syndrome of right wrist   . Coronary artery disease involving native coronary artery of native heart with unstable angina pectoris ([place]) 10/20/2016  . Diverticulitis   . GERD (gastroesophageal reflux disease)   . Gout   . Heart murmur   . Hyperlipemia   . Hypertension   . Postoperative atrial fibrillation ([place]) 10/24/2016  . RLS (restless legs syndrome)   . S/P aortic valve replacement with bioprosthetic valve 10/22/2016   25 mm [facility] Ease bovine pericardial bioprosthetic tissue valve  . S/P ascending aortic aneurysm repair 10/22/2016   28 mm supracoronary straight graft replacement of ascending thoracic aortic aneurysm  . S/P CABG x 3 10/22/2016   Sequential LIMA to Diag and LAD, SVG to distal LAD, open vein harvest right thigh  . S/P mitral valve repair 10/22/2016   Artificial Gore-tex neochord placement x6 - posterior annuloplasty band placed but removed due to systolic anterior motion of mitral valve  . Severe aortic stenosis   . Snores     Never been tested for sleep apnea  . Thoracic ascending aortic aneurysm ([place]) 10/21/2016  . Thrombocytopenia ([place]) 10/22/2016  . Type II diabetes mellitus ([place])     Past Surgical History:  Procedure Laterality Date  . AMPUTATION Bilateral 12/11/2016   Procedure: AMPUTATION BELOW KNEE BILATERALLY;  Surgeon: [name], MD;  Location: [place];  Service: Orthopedics;  Laterality: Bilateral;  . AMPUTATION Bilateral 12/11/2016   Procedure: AMPUTATION BILATERAL HANDS EXCEPT RIGHT THUMB;  Surgeon: [name], MD;  Location: [place];  Service: Orthopedics;  Laterality: Bilateral;  . AORTIC VALVE REPLACEMENT N/A 10/22/2016   Procedure: AORTIC VALVE REPLACEMENT (AVR) WITH SIZE 25 MM MAGNA EASE PERICARDIAL BIOPROSTHESIS - AORTIC;  Surgeon: Rexene Alberts, MD;  Location: [place];  Service: Open Heart Surgery;  Laterality: N/A;  . BONE EXCISION Right 02/01/2017   Procedure: EXCISION RIGHT INDEX METACARPAL HEAD;  Surgeon: [name], MD;  Location: [place];  Service: Orthopedics;  Laterality: Right;  . CARDIAC CATHETERIZATION N/A 10/20/2016   Procedure: Right/Left Heart Cath and Coronary Angiography;  Surgeon: Leonie Man, MD;  Location: [place] [facility];  Service: Cardiovascular;  Laterality: N/A;  . CARPAL TUNNEL RELEASE Right 11/28/2013   Procedure: RIGHT WRIST CARPAL TUNNEL RELEASE;  Surgeon: Lorn Junes, MD;  Location: [place];  Service: Orthopedics;  Laterality: Right;  . CATARACT EXTRACTION W/ INTRAOCULAR LENS  IMPLANT, BILATERAL Bilateral 1978  . COLONOSCOPY    .  CORONARY ARTERY BYPASS GRAFT N/A 10/22/2016   Procedure: CORONARY ARTERY BYPASS GRAFTING (CABG)x 2 WITH LIMA TO DIAGONAL, OPEN  HARVESTING OF RIGHT SAPHENOUS VEIN FOR VEIN GRAFT TO LAD;  Surgeon: Rexene Alberts, MD;  Location: [place];  Service: Open Heart Surgery;  Laterality: N/A;  . FRACTURE SURGERY    . INGUINAL HERNIA REPAIR Right 1998  . IR GENERIC HISTORICAL  12/07/2016   IR US GUIDE VASC ACCESS RIGHT  12/07/2016 Aletta Edouard, MD [facility]  . IR GENERIC HISTORICAL  12/07/2016   IR RADIOLOGY PERIPHERAL GUIDED IV START 12/07/2016 Aletta Edouard, MD [facility]  . IR GENERIC HISTORICAL  12/07/2016   IR GASTROSTOMY TUBE MOD SED 12/07/2016 Aletta Edouard, MD [facility]  . LIPOMA EXCISION Right 2008   "side of my head"  . MITRAL VALVE REPAIR N/A 10/22/2016   Procedure: MITRAL VALVE REPAIR (MVR) WITH SIZE 30 SORIN ANNULOFLEX ANNULOPLASTY RING WITH SUBSEQUENT REMOVAL OF RING;  Surgeon: Rexene Alberts, MD;  Location: [place];  Service: Open Heart Surgery;  Laterality: N/A;  . PENECTOMY  2007   Peyronie's disease   . PENILE PROSTHESIS IMPLANT  2009  . REMOVAL OF PENILE PROSTHESIS N/A 02/01/2017   Procedure: REMOVAL OF PENILE PROSTHESIS;  Surgeon: Kathie Rhodes, MD;  Location: [place];  Service: Urology;  Laterality: N/A;  . SHOULDER OPEN ROTATOR CUFF REPAIR Right 2006  . STERNAL CLOSURE N/A 10/26/2016   Procedure: STERNAL WASHOUT AND DELAYED PRIMARY CLOSURE;  Surgeon: Rexene Alberts, MD;  Location: [place];  Service: Thoracic;  Laterality: N/A;  . TEE WITHOUT CARDIOVERSION N/A 10/26/2016   Procedure: TRANSESOPHAGEAL ECHOCARDIOGRAM (TEE);  Surgeon: Rexene Alberts, MD;  Location: [place];  Service: Thoracic;  Laterality: N/A;  . TEE WITHOUT CARDIOVERSION N/A 10/22/2016   Procedure: TRANSESOPHAGEAL ECHOCARDIOGRAM (TEE);  Surgeon: Rexene Alberts, MD;  Location: [facility] [place];  Service: Open Heart Surgery;  Laterality: N/A;  . THORACIC AORTIC ANEURYSM REPAIR  10/22/2016   Procedure: ASCENDING AORTIC  ANEURYSM REPAIR (AAA) WITH 28 MM HEMASHIELD PLATINUM WOVEN DOUBLE VELOUR VASCULAR GRAFT;  Surgeon: Rexene Alberts, MD;  Location: [place];  Service: Open Heart Surgery;;  . TONSILLECTOMY  ~ 1955  . TRACHEOSTOMY TUBE PLACEMENT N/A 11/09/2016   Procedure: TRACHEOSTOMY;  Surgeon: Rexene Alberts, MD;  Location: [facility];  Service: Thoracic;  Laterality: N/A;  . TRANSURETHRAL RESECTION OF PROSTATE  2005  . TRIGGER FINGER  RELEASE Bilateral    several lt and rt hands  . TRIGGER FINGER RELEASE Right 11/28/2013   Procedure: RIGHT TRIGGER FINGER  RELEASE (TENDON SHEATH INCISION);  Surgeon: Lorn Junes, MD;  Location: [place];  Service: Orthopedics;  Laterality: Right;  Marland Kitchen VIDEO BRONCHOSCOPY N/A 11/09/2016   Procedure: VIDEO BRONCHOSCOPY;  Surgeon: Rexene Alberts, MD;  Location: [place];  Service: Thoracic;  Laterality: N/A;  . WRIST FRACTURE SURGERY Right ~ 1959    There were no vitals filed for this visit.      Subjective Assessment - 06/30/17 1111    Subjective Pressure sores still present but they continue to appear to be healing. Reports wearing prostheses for 5 hours on/1 hour off. Pt going to see prosthetist after PT today to make adjustments.   Patient is accompained by: Family member   Pertinent History arthritis, bicuspid aortic valve, BPH, carpal tunnel syndrome in R wrist, CAD, diverticulitis, GERD, gout, hyperlipidemia, HTN, DM type II,thrombocytopenia, thoracic ascending aortic aneurysm, severe aortic stenosis, amputation of bilateral hands except right thumb (  12/11/2016) due to gangrene, bilateral transtibial amputations (12/11/2016) due to gangrene, R and L heart cath (10/20/2016)    Limitations Standing;Walking;House hold activities   Patient Stated Goals use prostheses to walk including stairs, outdoors, target shooting, drive   Currently in Pain? Yes   Pain Score 3   pain from sore on knee   Pain Location Knee   Pain Orientation Right   Pain Descriptors / Indicators Tender;Sore   Pain Onset --                         [facility] Adult PT Treatment/Exercise - 06/30/17 1203      Bed Mobility   Bed Mobility --     Transfers   Transfers Sit to Stand;Stand to Sit   Sit to Stand 4: Min assist;5: Supervision;With upper extremity assist;With armrests;From chair/3-in-1  to cane, initially minA   Sit to Stand Details Verbal cues for safe use of DME/AE;Verbal cues for  technique;Verbal cues for sequencing   Sit to Stand Details (indicate cue type and reason) Worked on standing using arm rests for support with verbal and visual cues from PT for foot placement, use of UE and position of AD   Stand to Sit 4: Min guard;5: Supervision;With upper extremity assist;With armrests;To chair/3-in-1  cane, initially min guard   Stand to Sit Details (indicate cue type and reason) Verbal cues for safe use of DME/AE;Verbal cues for technique;Verbal cues for sequencing   Stand to Sit Details Worked on sitting using arm rests for support with verbal and visual cues from PT for foot placement, use of UEs and position of AD     Ambulation/Gait   Ambulation/Gait Yes   Ambulation/Gait Assistance 4: Min assist;3: Mod assist   Ambulation/Gait Assistance Details Walking along counters and around furniture with min guard of PT; walking in open spaces with modA initially progressing min assist of PT; PT provided verbal cues for sequencing, step length, use of cane & upright posture   Ambulation Distance (Feet) 120 Feet  120' using counter /table, 105' & 53"   Assistive device Straight cane;Prostheses  Coban used to secure R hand to handle of cane   Gait Pattern Step-to pattern;Decreased step length - right;Trunk flexed;Decreased arm swing - left   Ambulation Surface Level;Indoor   Stairs Yes   Stairs Assistance 4: Min assist;3: Mod assist   Stairs Assistance Details (indicate cue type and reason) PT provided manual, visual, verbal cues for proper ascent/descent of stairs   Stair Management Technique One rail Left;With cane  Cane in R hand   Number of Stairs 4  2 reps   Ramp 3: Mod assist  cane & prostheses   Ramp Details (indicate cue type and reason) verbal & manual cues on technique including sequence, posture, wt shift & step length.      Standish & amputee magazines. Donning long pants with prostheses.    Current prosthetic  wear tolerance (days/week)  daily    Current prosthetic wear tolerance (#hours/day)  5hrs on, 1-2 hrs off, 2x/day   Residual limb condition  RLE wound 41mm at tibial tuberosity. LLE on incision 2 mm. Pt reports got small suture out yesterday.  All wounds covered with Tegaderm properly.    Education Provided Skin check;Residual limb care;Correct ply sock adjustment;Proper wear schedule/adjustment   Person(s) Educated Patient;Child(ren)   Education Method Explanation;Verbal cues   Education Method Verbalized understanding;Verbal cues required;Needs further instruction  PT Education - 06/30/17 1200    Education provided Yes   Education Details Wound care, walking on deck and around table (using cane, rail/furniture and assistance with gait belt)   Person(s) Educated Patient;Child(ren)   Methods Explanation;Demonstration   Comprehension Verbalized understanding;Returned demonstration          PT Short Term Goals - 06/28/17 2212      PT SHORT TERM GOAL #1   Title Patient tolerates bil. LE prostheses wear >75% of awake hours without skin issues.    Time 1   Period Months   Status On-going   Target Date 07/21/17     PT SHORT TERM GOAL #2   Title Patient performs sit to/from stand from chairs without armrests without UE support to stablize modified independent.    Time 1   Period Months   Status On-going   Target Date 07/21/17     PT SHORT TERM GOAL #3   Title Patient reaches 10" anteriorly & within 5" of floor without UE support with supervision.    Time 1   Period Months   Status On-going   Target Date 07/21/17     PT SHORT TERM GOAL #4   Title Patient ambulates 300' with single UE support or less with minA.    Time 1   Period Months   Status On-going   Target Date 07/21/17     PT SHORT TERM GOAL #5   Title Patient negotiates ramps & curbs with cane or less with bil. prostheses with minA.    Time 1   Period Months   Status On-going   Target Date  07/21/17           PT Long Term Goals - 06/21/17 2106      PT LONG TERM GOAL #1   Title Patient demonstrates & verbalizes understanding of proper prosthetic care including independent donning to enable safe use of LE prostheses.    Time 2   Period Months   Status Revised   Target Date 08/20/17     PT LONG TERM GOAL #2   Title Patient will tolerate prostheses wear >90% of awake hours without skin issues or limb pain to demonstrate a decrease in his risk of falling.   Time 2   Period Months   Status On-going   Target Date 08/20/17     PT LONG TERM GOAL #3   Title Patient performs standing balance activities with intermittent UE support reaching 10", picking up objects from floor and looks over shoulders with weight shift, trunk rotation with prostheses independently.   Time 4   Period Weeks   Status On-going   Target Date 08/20/17     PT LONG TERM GOAL #4   Title Patient will ambulate 1000 feet including outdoor surfaces with LRAD and prostheses modified independently to enable community mobility.     Time 4   Period Months   Status On-going   Target Date 08/20/17     PT LONG TERM GOAL #5   Title Patient will negotiate ramp/curbs and stairs with LRAD and prostheses modified independent for community access.    Time 4   Period Months   Status On-going   Target Date 08/20/17     PT LONG TERM GOAL #6   Title Patient's gait velocity will be >/= 1.24ft/s to indicate a limited community ambulator. (Target Date: 08/20/2017)   Time 4   Period Weeks   Status On-going     PT LONG TERM  GOAL #7   Title Patient reports Activities of Balance Confindence score using FOTO >25% to indicate greater confidence in his balance. (Target Date: 08/20/2017)   Time 4   Period Months   Status On-going               Plan - 06/30/17 1224    Clinical Impression Statement Today's session addressed ambulation with SPC in R hand with Coban tape for grip with and without  counters/furniture for support on the L side, and ascending/descending stairs using cane on R and handrail on L. Pt required min to modA from PT to prevent LOB/falls, and visual, verbal, manual and demo cues from PT for sequencing, AD management and step length. Session also addressed sit to stand transfers while holding cane in R hand with visual and verbal cues for AD and foot placement. Pt is making progress toward goals and would benefit from continued therapy to ensure function and mobility.   Rehab Potential Good   Clinical Impairments Affecting Rehab Potential arthritis, bicuspid aortic valve, BPH, carpal tunnel syndrome in R wrist, CAD, diverticulitis, GERD, gout, hyperlipidemia, HTN, DM type II,thrombocytopenia, thoracic ascending aortic aneurysm, severe aortic stenosis, amputation of bilateral hands except right thumb (12/11/2016) due to gangrene, bilateral transtibial amputations (12/11/2016) due to gangrene, R and L heart cath (10/20/2016)    PT Frequency 2x / week   PT Duration Other (comment)  60 days (9 weeks)   PT Treatment/Interventions ADLs/Self Care Home Management;Neuromuscular re-education;Balance training;Therapeutic exercise;Therapeutic activities;Functional mobility training;Stair training;Gait training;DME Instruction;Patient/family education;Prosthetic Training   PT Next Visit Plan Do G-code using prosthetic wear & care, Progress gait training with SPC, stairs, ramps, curbs, balance activities   Consulted and Agree with Plan of Care Patient;Family member/caregiver   Family Member Consulted daughter       Patient will benefit from skilled therapeutic intervention in order to improve the following deficits and impairments:  Abnormal gait, Decreased activity tolerance, Decreased balance, Decreased coordination, Decreased range of motion, Decreased mobility, Decreased knowledge of use of DME, Decreased knowledge of precautions, Decreased endurance, Decreased skin integrity, Decreased  scar mobility, Decreased strength, Difficulty walking, Postural dysfunction, Prosthetic Dependency  Visit Diagnosis: Unsteadiness on feet  Muscle weakness (generalized)  Other abnormalities of gait and mobility  Other symptoms and signs involving the musculoskeletal system  Abnormal posture     Problem List Patient Active Problem List   Diagnosis Date Noted  . Amputation of both hands with complication, subsequent encounter 03/01/2017  . Diabetes ([place]) 02/19/2017  . Status post bilateral below knee amputation ([place]) 02/09/2017  . Wound disruption, post-op, skin, sequela 02/09/2017  . Debility 02/05/2017  . Ganglion upper arm, right   . Thrombosis of both upper extremities   . Suspected heparin induced thrombocytopenia (HIT) in hospitalized patient ([place])   . Gangrene of lower extremity ([place])   . Heparin induced thrombocytopenia (HIT) ([place])   . Tracheostomy status ([place])   . Chest tube in place   . Atherosclerosis of native arteries of extremities with gangrene, left leg ([place])   . Atherosclerosis of native arteries of extremities with gangrene, right leg ([place])   . Acute on chronic diastolic CHF (congestive heart failure) ([place])   . Enteritis due to Clostridium difficile   . Anasarca   . FUO (fever of unknown origin)   . Acute encephalopathy   . Elevated LFTs   . DIC (disseminated intravascular coagulation) ([place]) 10/28/2016  . Postoperative atrial fibrillation ([place]) 10/24/2016  . Cardiogenic shock ([place])   .  Mitral regurgitation due to cusp prolapse 10/22/2016  . S/P aortic valve replacement with bioprosthetic valve 10/22/2016  . S/P ascending aortic aneurysm repair 10/22/2016  . S/P mitral valve repair 10/22/2016  . S/P CABG x 3 10/22/2016  . Thrombocytopenia ([place]) 10/22/2016  . Acute combined systolic and diastolic congestive heart failure ([place]) 10/22/2016  . Acute respiratory failure ([place]) 10/22/2016  . Thoracic ascending aortic aneurysm ([place]) 10/21/2016  . Coronary  artery disease involving native coronary artery of native heart with unstable angina pectoris ([place]) 10/20/2016  . Severe aortic stenosis by prior echocardiography 10/19/2016  . Unstable angina ([place]) 10/19/2016  . Aortic valve stenosis 10/19/2016  . Bicuspid aortic valve 10/19/2016  . Trigger ring finger of right hand   . Wears glasses   . Gout   . Hypertension   . Carpal tunnel syndrome of right wrist   . Arthritis   . Snores    Gershon Crane, SPT 06/30/2017, 12:43 PM  Jamey Reas, PT, DPT 06/30/2017, 8:10 PM  [facility] [address] [place], [place], [address] Phone: [phone]   Fax:  [phone]  Name: Scott [name] MRN: [id number] Date of Birth: Apr 30, 1946

## 2017-06-30 NOTE — Therapy (Signed)
[facility] [address] [place], [place], [address] Phone: [phone]   Fax:  [phone]  Occupational Therapy Treatment  Patient Details  Name: Scott [name] MRN: [id number] Date of Birth: 09-02-1946 Referring Provider: Dr. Meridee Score  Encounter Date: 06/30/2017      OT End of Session - 06/30/17 1310    Visit Number 10   Number of Visits 16   Date for OT Re-Evaluation 07/21/17   Authorization Type medicare pt will need G code and PN every 10th visit   Authorization Time Period 60 days   Authorization - Visit Number 10   Authorization - Number of Visits 20   OT Start Time 6073   OT Stop Time 1230   OT Time Calculation (min) 45 min   Activity Tolerance Patient tolerated treatment well   Behavior During Therapy [facility] for tasks assessed/performed      Past Medical History:  Diagnosis Date  . Arthritis    "hips, shoulders; knees; back" (10/20/2016)  . Bicuspid aortic valve   . BPH (benign prostatic hypertrophy)   . Carpal tunnel syndrome of right wrist   . Coronary artery disease involving native coronary artery of native heart with unstable angina pectoris ([place]) 10/20/2016  . Diverticulitis   . GERD (gastroesophageal reflux disease)   . Gout   . Heart murmur   . Hyperlipemia   . Hypertension   . Postoperative atrial fibrillation ([place]) 10/24/2016  . RLS (restless legs syndrome)   . S/P aortic valve replacement with bioprosthetic valve 10/22/2016   25 mm [facility] Ease bovine pericardial bioprosthetic tissue valve  . S/P ascending aortic aneurysm repair 10/22/2016   28 mm supracoronary straight graft replacement of ascending thoracic aortic aneurysm  . S/P CABG x 3 10/22/2016   Sequential LIMA to Diag and LAD, SVG to distal LAD, open vein harvest right thigh  . S/P mitral valve repair 10/22/2016   Artificial Gore-tex neochord placement x6 - posterior annuloplasty band placed but removed due to  systolic anterior motion of mitral valve  . Severe aortic stenosis   . Snores    Never been tested for sleep apnea  . Thoracic ascending aortic aneurysm ([place]) 10/21/2016  . Thrombocytopenia ([place]) 10/22/2016  . Type II diabetes mellitus ([place])     Past Surgical History:  Procedure Laterality Date  . AMPUTATION Bilateral 12/11/2016   Procedure: AMPUTATION BELOW KNEE BILATERALLY;  Surgeon: [name], MD;  Location: [place];  Service: Orthopedics;  Laterality: Bilateral;  . AMPUTATION Bilateral 12/11/2016   Procedure: AMPUTATION BILATERAL HANDS EXCEPT RIGHT THUMB;  Surgeon: [name], MD;  Location: [place];  Service: Orthopedics;  Laterality: Bilateral;  . AORTIC VALVE REPLACEMENT N/A 10/22/2016   Procedure: AORTIC VALVE REPLACEMENT (AVR) WITH SIZE 25 MM MAGNA EASE PERICARDIAL BIOPROSTHESIS - AORTIC;  Surgeon: Rexene Alberts, MD;  Location: [place];  Service: Open Heart Surgery;  Laterality: N/A;  . BONE EXCISION Right 02/01/2017   Procedure: EXCISION RIGHT INDEX METACARPAL HEAD;  Surgeon: [name], MD;  Location: [place];  Service: Orthopedics;  Laterality: Right;  . CARDIAC CATHETERIZATION N/A 10/20/2016   Procedure: Right/Left Heart Cath and Coronary Angiography;  Surgeon: Leonie Man, MD;  Location: [place] [facility];  Service: Cardiovascular;  Laterality: N/A;  . CARPAL TUNNEL RELEASE Right 11/28/2013   Procedure: RIGHT WRIST CARPAL TUNNEL RELEASE;  Surgeon: Lorn Junes, MD;  Location: [place];  Service: Orthopedics;  Laterality: Right;  .  CATARACT EXTRACTION W/ INTRAOCULAR LENS  IMPLANT, BILATERAL Bilateral 1978  . COLONOSCOPY    . CORONARY ARTERY BYPASS GRAFT N/A 10/22/2016   Procedure: CORONARY ARTERY BYPASS GRAFTING (CABG)x 2 WITH LIMA TO DIAGONAL, OPEN  HARVESTING OF RIGHT SAPHENOUS VEIN FOR VEIN GRAFT TO LAD;  Surgeon: Rexene Alberts, MD;  Location: [place];  Service: Open Heart Surgery;  Laterality: N/A;  . FRACTURE SURGERY    . INGUINAL HERNIA REPAIR  Right 1998  . IR GENERIC HISTORICAL  12/07/2016   IR US GUIDE VASC ACCESS RIGHT 12/07/2016 Aletta Edouard, MD [facility]  . IR GENERIC HISTORICAL  12/07/2016   IR RADIOLOGY PERIPHERAL GUIDED IV START 12/07/2016 Aletta Edouard, MD [facility]  . IR GENERIC HISTORICAL  12/07/2016   IR GASTROSTOMY TUBE MOD SED 12/07/2016 Aletta Edouard, MD [facility]  . LIPOMA EXCISION Right 2008   "side of my head"  . MITRAL VALVE REPAIR N/A 10/22/2016   Procedure: MITRAL VALVE REPAIR (MVR) WITH SIZE 30 SORIN ANNULOFLEX ANNULOPLASTY RING WITH SUBSEQUENT REMOVAL OF RING;  Surgeon: Rexene Alberts, MD;  Location: [place];  Service: Open Heart Surgery;  Laterality: N/A;  . PENECTOMY  2007   Peyronie's disease   . PENILE PROSTHESIS IMPLANT  2009  . REMOVAL OF PENILE PROSTHESIS N/A 02/01/2017   Procedure: REMOVAL OF PENILE PROSTHESIS;  Surgeon: Kathie Rhodes, MD;  Location: [facility];  Service: Urology;  Laterality: N/A;  . SHOULDER OPEN ROTATOR CUFF REPAIR Right 2006  . STERNAL CLOSURE N/A 10/26/2016   Procedure: STERNAL WASHOUT AND DELAYED PRIMARY CLOSURE;  Surgeon: Rexene Alberts, MD;  Location: [place];  Service: Thoracic;  Laterality: N/A;  . TEE WITHOUT CARDIOVERSION N/A 10/26/2016   Procedure: TRANSESOPHAGEAL ECHOCARDIOGRAM (TEE);  Surgeon: Rexene Alberts, MD;  Location: [place];  Service: Thoracic;  Laterality: N/A;  . TEE WITHOUT CARDIOVERSION N/A 10/22/2016   Procedure: TRANSESOPHAGEAL ECHOCARDIOGRAM (TEE);  Surgeon: Rexene Alberts, MD;  Location: [place];  Service: Open Heart Surgery;  Laterality: N/A;  . THORACIC AORTIC ANEURYSM REPAIR  10/22/2016   Procedure: ASCENDING AORTIC  ANEURYSM REPAIR (AAA) WITH 28 MM HEMASHIELD PLATINUM WOVEN DOUBLE VELOUR VASCULAR GRAFT;  Surgeon: Rexene Alberts, MD;  Location: [place];  Service: Open Heart Surgery;;  . TONSILLECTOMY  ~ 1955  . TRACHEOSTOMY TUBE PLACEMENT N/A 11/09/2016   Procedure: TRACHEOSTOMY;  Surgeon: Rexene Alberts, MD;  Location: [place] [facility];  Service: Thoracic;   Laterality: N/A;  . TRANSURETHRAL RESECTION OF PROSTATE  2005  . TRIGGER FINGER RELEASE Bilateral    several lt and rt hands  . TRIGGER FINGER RELEASE Right 11/28/2013   Procedure: RIGHT TRIGGER FINGER  RELEASE (TENDON SHEATH INCISION);  Surgeon: Lorn Junes, MD;  Location: [place];  Service: Orthopedics;  Laterality: Right;  Marland Kitchen VIDEO BRONCHOSCOPY N/A 11/09/2016   Procedure: VIDEO BRONCHOSCOPY;  Surgeon: Rexene Alberts, MD;  Location: [facility];  Service: Thoracic;  Laterality: N/A;  . WRIST FRACTURE SURGERY Right ~ 1959    There were no vitals filed for this visit.      Subjective Assessment - 06/30/17 1257    Subjective  I have worn it about a half hour at a time. (left UE prosthesis)   Patient is accompained by: Family member   Pertinent History see epic. Pt with Bil BKA as well as bilateral UE amputations of all fingers excepet L thumb at MCP joint due to gangrene.    Patient Stated Goals walk, drive a car  and go where I want to go. Wife hopes pt can go out to eat in a restaurant and use public bathroom and eat .    Currently in Pain? Yes   Pain Score 3    Pain Location Knee   Pain Orientation Right   Pain Descriptors / Indicators Sore   Pain Type Chronic pain   Pain Onset More than a month ago   Pain Frequency Constant   Aggravating Factors  pressure   Pain Relieving Factors removal of prosthesis   Multiple Pain Sites No                      OT Treatments/Exercises (OP) - 06/30/17 1301      ADLs   Bathing Discussed potential adaptations for hand held shower head to increase ability to grip.  Patient will bring to next session.     Functional Mobility Worked with patient on functional ambulation with cane in kitchen to attempt functional application for LUE prosthesis.  Patient able to don prosthesis in standing (at countertop)  Able to open and close refrigerator door, obtain lightweight plastic container, and transport across 5 feet to table.   Patient able to open cabinets at shoulder height and drawers at hip height using left prosthesis.  With increased time and min cueing able to turn on stovetop controls with prosthesis.  Patient's balance is improving, less reliant on UE support for static standing.  Patient needs cueing for optimal body placement for best use of prosthetic control for hook.                  OT Education - 06/30/17 1310    Education provided Yes   Education Details use of LUE prosthesis in functional setting, in sitting and dynamic standing conditions   Person(s) Educated Child(ren)   Methods Explanation;Demonstration;Verbal cues   Comprehension Verbalized understanding;Returned demonstration          OT Short Term Goals - 06/30/17 1314      OT SHORT TERM GOAL #1   Title PT and wife will be mod I with home activities program - 06/14/2017   Status Achieved     OT SHORT TERM GOAL #2   Title Pt will be supervision for 3 in 1 commode transfers   Status Achieved     OT SHORT TERM GOAL #3   Title Pt will demonstrate ability to use opposition splint with prn with basic ADL tasks   Status Achieved     OT SHORT TERM GOAL #4   Title Pt will be able to complete simple cold snack prep with min a   Status Achieved           OT Long Term Goals - 06/30/17 1314      OT LONG TERM GOAL #1   Title Pt will be mod I with home activities program - 07/21/2017   Status On-going     OT LONG TERM GOAL #2   Title Pt will be mod I with toilet transfers   Status Achieved     OT LONG TERM GOAL #3   Title IF pt is able to complete stairs, pt will be mod I with tub bench transfers   Status On-going     OT LONG TERM GOAL #4   Title Pt will verbalize understanding of driving eval information   Status Achieved     OT LONG TERM GOAL #5   Title Pt will be mod I with  toilet hygiene with AE   Status Achieved     OT LONG TERM GOAL #6   Title Pt will be mod I with buttoning, zipping using AE/prostheses    Status Achieved     OT LONG TERM GOAL #7   Title Pt will be mod I with cutting food with AE/prostheses   Status Achieved     OT LONG TERM GOAL #8   Title Pt will be able to write at 3 sentence level with 100% legibility with AE.    Status On-going     OT LONG TERM GOAL  #9   Baseline Pt will demonstrate ability to use LUE prosthesis as stabilizer during simple functional tasks   Status On-going     OT LONG TERM GOAL  #10   TITLE Pt will be mod I with donning and doffing LUE prosthesis.   Status Achieved  Patient can don and doff, but cannot change head from claw to hook               Plan - 10-Jul-2017 1311    Clinical Impression Statement Patient is showing excellent progress toward OT goal achievement.  Patient with improved balance, and improved knowledge and comfort with LUE prosthesis.     Rehab Potential Good   OT Frequency 2x / week   OT Duration 8 weeks   OT Treatment/Interventions Self-care/ADL training;Moist Heat;Cryotherapy;Ultrasound;Fluidtherapy;DME and/or AE instruction;Neuromuscular education;Therapeutic exercise;Therapist, nutritional;Therapeutic activities;Splinting;Patient/family education;Balance training   Plan Modify shower head, adapt lifting glove for cane grip, discharge or hold at end of next week until RUE prosthesis ready   [place] activity program - increase tolerance for LUE prosthesis, increase variety of uses through practice   Consulted and Agree with Plan of Care Patient   Family Member Consulted dtr      Patient will benefit from skilled therapeutic intervention in order to improve the following deficits and impairments:  Abnormal gait, Decreased activity tolerance, Decreased balance, Decreased mobility, Decreased knowledge of use of DME, Difficulty walking, Impaired UE functional use, Impaired sensation, Pain, Other (comment)  Visit Diagnosis: Muscle weakness (generalized)  Other symptoms and signs involving the  musculoskeletal system  Pain in right hand  Pain in left hand  Other disturbances of skin sensation      G-Codes - 10-Jul-2017 1322    Functional Assessment Tool Used (Outpatient only) skilled clinical observation   Functional Limitation Self care   Self Care Current Status (S5681) At least 60 percent but less than 80 percent impaired, limited or restricted   Self Care Goal Status (E7517) At least 20 percent but less than 40 percent impaired, limited or restricted      Problem List Patient Active Problem List   Diagnosis Date Noted  . Amputation of both hands with complication, subsequent encounter 03/01/2017  . Diabetes ([place]) 02/19/2017  . Status post bilateral below knee amputation ([place]) 02/09/2017  . Wound disruption, post-op, skin, sequela 02/09/2017  . Debility 02/05/2017  . Ganglion upper arm, right   . Thrombosis of both upper extremities   . Suspected heparin induced thrombocytopenia (HIT) in hospitalized patient ([place])   . Gangrene of lower extremity ([place])   . Heparin induced thrombocytopenia (HIT) ([place])   . Tracheostomy status ([place])   . Chest tube in place   . Atherosclerosis of native arteries of extremities with gangrene, left leg ([place])   . Atherosclerosis of native arteries of extremities with gangrene, right leg ([place])   . Acute on chronic  diastolic CHF (congestive heart failure) ([place])   . Enteritis due to Clostridium difficile   . Anasarca   . FUO (fever of unknown origin)   . Acute encephalopathy   . Elevated LFTs   . DIC (disseminated intravascular coagulation) ([place]) 10/28/2016  . Postoperative atrial fibrillation ([place]) 10/24/2016  . Cardiogenic shock ([place])   . Mitral regurgitation due to cusp prolapse 10/22/2016  . S/P aortic valve replacement with bioprosthetic valve 10/22/2016  . S/P ascending aortic aneurysm repair 10/22/2016  . S/P mitral valve repair 10/22/2016  . S/P CABG x 3 10/22/2016  . Thrombocytopenia ([place]) 10/22/2016  . Acute combined  systolic and diastolic congestive heart failure ([place]) 10/22/2016  . Acute respiratory failure ([place]) 10/22/2016  . Thoracic ascending aortic aneurysm ([place]) 10/21/2016  . Coronary artery disease involving native coronary artery of native heart with unstable angina pectoris ([place]) 10/20/2016  . Severe aortic stenosis by prior echocardiography 10/19/2016  . Unstable angina ([place]) 10/19/2016  . Aortic valve stenosis 10/19/2016  . Bicuspid aortic valve 10/19/2016  . Trigger ring finger of right hand   . Wears glasses   . Gout   . Hypertension   . Carpal tunnel syndrome of right wrist   . Arthritis   . Snores   Occupational Therapy Progress Note  Dates of Reporting Period: 05/17/2017 to 06/30/2017  Patient has met all short term, and many long term goals due to improved endurance, stand tolerance, static stand balance, and knowledge and use of UE prosthetics (wear, care and applications)   Goal Update: See above  Plan: See above  Reason Skilled Services are Required:  Patient making excellent progress, and has remaining long term goals to achieve to increase his independence with ADL/IADL and reduce care partner buren.  Patient needs additional practice with LUE prosthesis, and needs improved independence with functional mobility with cane.  Patient is highly motivated to be as independent as possible.     :Mariah Milling, OTR/L 06/30/2017, 1:23 PM  [facility] [address] [place], [place], [address] Phone: [phone]   Fax:  [phone]  Name: Scott [name] MRN: [id number] Date of Birth: August 18, 1946

## 2017-07-01 ENCOUNTER — Ambulatory Visit (INDEPENDENT_AMBULATORY_CARE_PROVIDER_SITE_OTHER): Payer: Medicare Other | Admitting: *Deleted

## 2017-07-01 DIAGNOSIS — I2510 Atherosclerotic heart disease of native coronary artery without angina pectoris: Secondary | ICD-10-CM

## 2017-07-01 DIAGNOSIS — I4891 Unspecified atrial fibrillation: Secondary | ICD-10-CM | POA: Diagnosis not present

## 2017-07-01 DIAGNOSIS — I9789 Other postprocedural complications and disorders of the circulatory system, not elsewhere classified: Secondary | ICD-10-CM

## 2017-07-01 DIAGNOSIS — Z9889 Other specified postprocedural states: Secondary | ICD-10-CM | POA: Diagnosis not present

## 2017-07-01 DIAGNOSIS — I82603 Acute embolism and thrombosis of unspecified veins of upper extremity, bilateral: Secondary | ICD-10-CM

## 2017-07-01 DIAGNOSIS — Q231 Congenital insufficiency of aortic valve: Secondary | ICD-10-CM | POA: Diagnosis not present

## 2017-07-01 LAB — POCT INR: INR: 2.1

## 2017-07-05 ENCOUNTER — Encounter: Payer: Self-pay | Admitting: Physical Therapy

## 2017-07-05 ENCOUNTER — Ambulatory Visit: Payer: Medicare Other | Admitting: Occupational Therapy

## 2017-07-05 ENCOUNTER — Encounter: Payer: Self-pay | Admitting: Occupational Therapy

## 2017-07-05 ENCOUNTER — Telehealth (INDEPENDENT_AMBULATORY_CARE_PROVIDER_SITE_OTHER): Payer: Self-pay | Admitting: Orthopedic Surgery

## 2017-07-05 ENCOUNTER — Ambulatory Visit: Payer: Medicare Other | Admitting: Physical Therapy

## 2017-07-05 DIAGNOSIS — M6281 Muscle weakness (generalized): Secondary | ICD-10-CM | POA: Diagnosis not present

## 2017-07-05 DIAGNOSIS — R29898 Other symptoms and signs involving the musculoskeletal system: Secondary | ICD-10-CM

## 2017-07-05 DIAGNOSIS — R293 Abnormal posture: Secondary | ICD-10-CM | POA: Diagnosis not present

## 2017-07-05 DIAGNOSIS — R2681 Unsteadiness on feet: Secondary | ICD-10-CM | POA: Diagnosis not present

## 2017-07-05 DIAGNOSIS — R2689 Other abnormalities of gait and mobility: Secondary | ICD-10-CM

## 2017-07-05 DIAGNOSIS — M79642 Pain in left hand: Secondary | ICD-10-CM

## 2017-07-05 DIAGNOSIS — M79641 Pain in right hand: Secondary | ICD-10-CM

## 2017-07-05 DIAGNOSIS — R208 Other disturbances of skin sensation: Secondary | ICD-10-CM

## 2017-07-05 MED ORDER — CEPHALEXIN 500 MG PO CAPS: 500.0000 mg | ORAL_CAPSULE | Freq: Four times a day (QID) | ORAL | 0 refills | Status: DC

## 2017-07-05 NOTE — Telephone Encounter (Signed)
They will be seeing you on Wednesday. But family is very concerned exposed to staph infection by another family member. Would like oral antibiotic. Encouraged family to have patient not wear prosthetic, currently wearing 10-15 hours a day. Needs to hold on physical therapy until office visit. Allergic to doxycycline and sulfa antibiotics. Will call Zaiah Eckerson his wife to advise all this. [phone].

## 2017-07-05 NOTE — Telephone Encounter (Signed)
Would need to come in for evaluation. Please call to make appt to see Scott [name] this week. Wednesday morning would be good.

## 2017-07-05 NOTE — Therapy (Signed)
[facility] [address] [place], [place], [address] Phone: [phone]   Fax:  [phone]  Occupational Therapy Treatment  Patient Details  Name: Scott [name] MRN: [id number] Date of Birth: [birth year] Referring Provider: Dr. Meridee Score  Encounter Date: 07/05/2017      OT End of Session - 07/05/17 1315    Visit Number 11   Number of Visits 16   Date for OT Re-Evaluation 07/21/17   Authorization Type medicare pt will need G code and PN every 10th visit   Authorization Time Period 60 days   Authorization - Visit Number 11   Authorization - Number of Visits 20   OT Start Time 1150   OT Stop Time 1231   OT Time Calculation (min) 41 min   Activity Tolerance Patient tolerated treatment well      Past Medical History:  Diagnosis Date  . Arthritis    "hips, shoulders; knees; back" (10/20/2016)  . Bicuspid aortic valve   . BPH (benign prostatic hypertrophy)   . Carpal tunnel syndrome of right wrist   . Coronary artery disease involving native coronary artery of native heart with unstable angina pectoris ([place]) 10/20/2016  . Diverticulitis   . GERD (gastroesophageal reflux disease)   . Gout   . Heart murmur   . Hyperlipemia   . Hypertension   . Postoperative atrial fibrillation ([place]) 10/24/2016  . RLS (restless legs syndrome)   . S/P aortic valve replacement with bioprosthetic valve 10/22/2016   25 mm [facility] Ease bovine pericardial bioprosthetic tissue valve  . S/P ascending aortic aneurysm repair 10/22/2016   28 mm supracoronary straight graft replacement of ascending thoracic aortic aneurysm  . S/P CABG x 3 10/22/2016   Sequential LIMA to Diag and LAD, SVG to distal LAD, open vein harvest right thigh  . S/P mitral valve repair 10/22/2016   Artificial Gore-tex neochord placement x6 - posterior annuloplasty band placed but removed due to systolic anterior motion of mitral valve  . Severe aortic  stenosis   . Snores    Never been tested for sleep apnea  . Thoracic ascending aortic aneurysm ([place]) 10/21/2016  . Thrombocytopenia ([place]) 10/22/2016  . Type II diabetes mellitus ([place])     Past Surgical History:  Procedure Laterality Date  . AMPUTATION Bilateral 12/11/2016   Procedure: AMPUTATION BELOW KNEE BILATERALLY;  Surgeon: [name], MD;  Location: [place];  Service: Orthopedics;  Laterality: Bilateral;  . AMPUTATION Bilateral 12/11/2016   Procedure: AMPUTATION BILATERAL HANDS EXCEPT RIGHT THUMB;  Surgeon: [name], MD;  Location: [place];  Service: Orthopedics;  Laterality: Bilateral;  . AORTIC VALVE REPLACEMENT N/A 10/22/2016   Procedure: AORTIC VALVE REPLACEMENT (AVR) WITH SIZE 25 MM MAGNA EASE PERICARDIAL BIOPROSTHESIS - AORTIC;  Surgeon: Rexene Alberts, MD;  Location: [place];  Service: Open Heart Surgery;  Laterality: N/A;  . BONE EXCISION Right 02/01/2017   Procedure: EXCISION RIGHT INDEX METACARPAL HEAD;  Surgeon: [name], MD;  Location: [facility];  Service: Orthopedics;  Laterality: Right;  . CARDIAC CATHETERIZATION N/A 10/20/2016   Procedure: Right/Left Heart Cath and Coronary Angiography;  Surgeon: Leonie Man, MD;  Location: [place] [facility];  Service: Cardiovascular;  Laterality: N/A;  . CARPAL TUNNEL RELEASE Right 11/28/2013   Procedure: RIGHT WRIST CARPAL TUNNEL RELEASE;  Surgeon: Lorn Junes, MD;  Location: [facility];  Service: Orthopedics;  Laterality: Right;  . CATARACT EXTRACTION W/ INTRAOCULAR LENS  IMPLANT, BILATERAL  Bilateral 1978  . COLONOSCOPY    . CORONARY ARTERY BYPASS GRAFT N/A 10/22/2016   Procedure: CORONARY ARTERY BYPASS GRAFTING (CABG)x 2 WITH LIMA TO DIAGONAL, OPEN  HARVESTING OF RIGHT SAPHENOUS VEIN FOR VEIN GRAFT TO LAD;  Surgeon: Rexene Alberts, MD;  Location: [facility];  Service: Open Heart Surgery;  Laterality: N/A;  . FRACTURE SURGERY    . INGUINAL HERNIA REPAIR Right 1998  . IR GENERIC HISTORICAL  12/07/2016   IR US  GUIDE VASC ACCESS RIGHT 12/07/2016 Aletta Edouard, MD [facility]  . IR GENERIC HISTORICAL  12/07/2016   IR RADIOLOGY PERIPHERAL GUIDED IV START 12/07/2016 Aletta Edouard, MD [facility]  . IR GENERIC HISTORICAL  12/07/2016   IR GASTROSTOMY TUBE MOD SED 12/07/2016 Aletta Edouard, MD [facility]  . LIPOMA EXCISION Right 2008   "side of my head"  . MITRAL VALVE REPAIR N/A 10/22/2016   Procedure: MITRAL VALVE REPAIR (MVR) WITH SIZE 30 SORIN ANNULOFLEX ANNULOPLASTY RING WITH SUBSEQUENT REMOVAL OF RING;  Surgeon: Rexene Alberts, MD;  Location: [place] [facility];  Service: Open Heart Surgery;  Laterality: N/A;  . PENECTOMY  2007   Peyronie's disease   . PENILE PROSTHESIS IMPLANT  2009  . REMOVAL OF PENILE PROSTHESIS N/A 02/01/2017   Procedure: REMOVAL OF PENILE PROSTHESIS;  Surgeon: Kathie Rhodes, MD;  Location: [facility] [facility];  Service: Urology;  Laterality: N/A;  . SHOULDER OPEN ROTATOR CUFF REPAIR Right 2006  . STERNAL CLOSURE N/A 10/26/2016   Procedure: STERNAL WASHOUT AND DELAYED PRIMARY CLOSURE;  Surgeon: Rexene Alberts, MD;  Location: [facility];  Service: Thoracic;  Laterality: N/A;  . TEE WITHOUT CARDIOVERSION N/A 10/26/2016   Procedure: TRANSESOPHAGEAL ECHOCARDIOGRAM (TEE);  Surgeon: Rexene Alberts, MD;  Location: [place];  Service: Thoracic;  Laterality: N/A;  . TEE WITHOUT CARDIOVERSION N/A 10/22/2016   Procedure: TRANSESOPHAGEAL ECHOCARDIOGRAM (TEE);  Surgeon: Rexene Alberts, MD;  Location: [facility];  Service: Open Heart Surgery;  Laterality: N/A;  . THORACIC AORTIC ANEURYSM REPAIR  10/22/2016   Procedure: ASCENDING AORTIC  ANEURYSM REPAIR (AAA) WITH 28 MM HEMASHIELD PLATINUM WOVEN DOUBLE VELOUR VASCULAR GRAFT;  Surgeon: Rexene Alberts, MD;  Location: [place];  Service: Open Heart Surgery;;  . TONSILLECTOMY  ~ 1955  . TRACHEOSTOMY TUBE PLACEMENT N/A 11/09/2016   Procedure: TRACHEOSTOMY;  Surgeon: Rexene Alberts, MD;  Location: [facility];  Service: Thoracic;  Laterality: N/A;  . TRANSURETHRAL RESECTION OF PROSTATE   2005  . TRIGGER FINGER RELEASE Bilateral    several lt and rt hands  . TRIGGER FINGER RELEASE Right 11/28/2013   Procedure: RIGHT TRIGGER FINGER  RELEASE (TENDON SHEATH INCISION);  Surgeon: Lorn Junes, MD;  Location: [facility];  Service: Orthopedics;  Laterality: Right;  Marland Kitchen VIDEO BRONCHOSCOPY N/A 11/09/2016   Procedure: VIDEO BRONCHOSCOPY;  Surgeon: Rexene Alberts, MD;  Location: [facility];  Service: Thoracic;  Laterality: N/A;  . WRIST FRACTURE SURGERY Right ~ 1959    There were no vitals filed for this visit.      Subjective Assessment - 07/05/17 1305    Subjective  I am not sure how I can hold onto this cane.    Patient is accompained by: Family member  dtr   Pertinent History see epic. Pt with Bil BKA as well as bilateral UE amputations of all fingers excepet L thumb at MCP joint due to gangrene.    Patient Stated Goals walk, drive a car and go where I want to go. Wife  hopes pt can go out to eat in a restaurant and use public bathroom and eat .    Currently in Pain? Yes   Pain Score 5    Pain Location Knee   Pain Orientation Right   Pain Descriptors / Indicators Sore   Pain Type Chronic pain   Pain Onset More than a month ago   Pain Frequency Constant   Aggravating Factors  pressure   Pain Relieving Factors removal of prosthesis   Multiple Pain Sites No                      OT Treatments/Exercises (OP) - 07/05/17 0001      ADLs   Bathing Pt is showering by himself at home however had great difficulty holding onto hand held shower head and drops it frequently in shower. Utilzed splinting material to fabricate holder with strap for pt to use with shower head and pt able to return demonstrate use of this in clinic without diffculty. Pt able to don and doff this. Pt to use at home this week.     Functional Mobility Pt working in PT toward use of single point cane but has no way to hold to onto cane (PT currently using coban wrapping in therapy  session).  Discussed options and problem solved with pt - best option appears to be have prosthetist make a similar hand hold for cane that was made for walker with hand splint as base.  Tried hand splint on cane and this would appear to work for pt. Pt and dtr to take pt's cane and hand splint to prosthetist this week for fabrication.                    OT Short Term Goals - 07/05/17 1311      OT SHORT TERM GOAL #1   Title PT and wife will be mod I with home activities program - 06/14/2017   Status Achieved     OT SHORT TERM GOAL #2   Title Pt will be supervision for 3 in 1 commode transfers   Status Achieved     OT SHORT TERM GOAL #3   Title Pt will demonstrate ability to use opposition splint with prn with basic ADL tasks   Status Achieved     OT SHORT TERM GOAL #4   Title Pt will be able to complete simple cold snack prep with min a   Status Achieved           OT Long Term Goals - 07/05/17 1311      OT LONG TERM GOAL #1   Title Pt will be mod I with home activities program - 07/21/2017   Status On-going     OT LONG TERM GOAL #2   Title Pt will be mod I with toilet transfers   Status Achieved     OT LONG TERM GOAL #3   Title IF pt is able to complete stairs, pt will be mod I with tub bench transfers   Status On-going     OT LONG TERM GOAL #4   Title Pt will verbalize understanding of driving eval information   Status Achieved     OT LONG TERM GOAL #5   Title Pt will be mod I with toilet hygiene with AE   Status Achieved     OT LONG TERM GOAL #6   Title Pt will be mod I with buttoning, zipping using AE/prostheses  Status Achieved     OT LONG TERM GOAL #7   Title Pt will be mod I with cutting food with AE/prostheses   Status Achieved     OT LONG TERM GOAL #8   Title Pt will be able to write at 3 sentence level with 100% legibility with AE.    Status On-going     OT LONG TERM GOAL  #9   Baseline Pt will demonstrate ability to use LUE prosthesis  as stabilizer during simple functional tasks   Status On-going     OT LONG TERM GOAL  #10   TITLE Pt will be mod I with donning and doffing LUE prosthesis.   Status Achieved  Patient can don and doff, but cannot change head from claw to hook               Plan - 07/05/17 1311    Clinical Impression Statement Pt continues to progress toward goals and to gain independence in ADL and basic IADL activities. Pt is very motivated.    Rehab Potential Good   OT Frequency 2x / week   OT Duration 8 weeks   OT Treatment/Interventions Self-care/ADL training;Moist Heat;Cryotherapy;Ultrasound;Fluidtherapy;DME and/or AE instruction;Neuromuscular education;Therapeutic exercise;Therapist, nutritional;Therapeutic activities;Splinting;Patient/family education;Balance training   Plan check to see if shower head working, give UE strengthening program, f cane prosthesis completed work at ambulatory level with cane and UE prosthesis.   Consulted and Agree with Plan of Care Patient;Family member/caregiver   Family Member Consulted dtr      Patient will benefit from skilled therapeutic intervention in order to improve the following deficits and impairments:  Abnormal gait, Decreased activity tolerance, Decreased balance, Decreased mobility, Decreased knowledge of use of DME, Difficulty walking, Impaired UE functional use, Impaired sensation, Pain, Other (comment)  Visit Diagnosis: Unsteadiness on feet  Muscle weakness (generalized)  Other symptoms and signs involving the musculoskeletal system  Pain in right hand  Pain in left hand  Other disturbances of skin sensation  Abnormal posture    Problem List Patient Active Problem List   Diagnosis Date Noted  . Amputation of both hands with complication, subsequent encounter 03/01/2017  . Diabetes ([place]) 02/19/2017  . Status post bilateral below knee amputation ([place]) 02/09/2017  . Wound disruption, post-op, skin, sequela 02/09/2017  .  Debility 02/05/2017  . Ganglion upper arm, right   . Thrombosis of both upper extremities   . Suspected heparin induced thrombocytopenia (HIT) in hospitalized patient ([place])   . Gangrene of lower extremity ([place])   . Heparin induced thrombocytopenia (HIT) ([place])   . Tracheostomy status ([place])   . Chest tube in place   . Atherosclerosis of native arteries of extremities with gangrene, left leg ([place])   . Atherosclerosis of native arteries of extremities with gangrene, right leg ([place])   . Acute on chronic diastolic CHF (congestive heart failure) ([place])   . Enteritis due to Clostridium difficile   . Anasarca   . FUO (fever of unknown origin)   . Acute encephalopathy   . Elevated LFTs   . DIC (disseminated intravascular coagulation) ([place]) 10/28/2016  . Postoperative atrial fibrillation ([place]) 10/24/2016  . Cardiogenic shock ([place])   . Mitral regurgitation due to cusp prolapse 10/22/2016  . S/P aortic valve replacement with bioprosthetic valve 10/22/2016  . S/P ascending aortic aneurysm repair 10/22/2016  . S/P mitral valve repair 10/22/2016  . S/P CABG x 3 10/22/2016  . Thrombocytopenia ([place]) 10/22/2016  . Acute combined systolic and diastolic congestive heart failure (  [place]) 10/22/2016  . Acute respiratory failure ([place]) 10/22/2016  . Thoracic ascending aortic aneurysm ([place]) 10/21/2016  . Coronary artery disease involving native coronary artery of native heart with unstable angina pectoris ([place]) 10/20/2016  . Severe aortic stenosis by prior echocardiography 10/19/2016  . Unstable angina ([place]) 10/19/2016  . Aortic valve stenosis 10/19/2016  . Bicuspid aortic valve 10/19/2016  . Trigger ring finger of right hand   . Wears glasses   . Gout   . Hypertension   . Carpal tunnel syndrome of right wrist   . Arthritis   . Snores     Quay Burow, OTR/L 07/05/2017, 1:16 PM  [facility] [address] [place], [place],  [address] Phone: [phone]   Fax:  [phone]  Name: Scott [name] MRN: [id number] Date of Birth: 05-04-1946

## 2017-07-05 NOTE — Telephone Encounter (Signed)
Patients daughter called concerned about her fathers leg. He has a small open wound from where his prosthetic leg has been rubbing and it's a little red and the physical therapist is concerned it may be infected. She was wondering if he could be prescribed an oral or cream antibiotic? CB # [phone]

## 2017-07-05 NOTE — Therapy (Signed)
[facility] [address] [employer] [place], [place], [address] Phone: [phone]   Fax:  [phone]  Physical Therapy Treatment  Patient Details  Name: Scott [name] MRN: [id number] Date of Birth: February 11, 1946 Referring Provider: Meridee Score MD   Encounter Date: 07/05/2017      PT End of Session - 07/05/17 1252    Visit Number 20   Number of Visits 33   Date for PT Re-Evaluation 08/20/17   Authorization Type Medicare & G-codes every 10th visit    PT Start Time 1100   PT Stop Time 1150   PT Time Calculation (min) 50 min   Equipment Utilized During Treatment Gait belt  SPC   Activity Tolerance Patient tolerated treatment well   Behavior During Therapy [facility] for tasks assessed/performed      Past Medical History:  Diagnosis Date  . Arthritis    "hips, shoulders; knees; back" (10/20/2016)  . Bicuspid aortic valve   . BPH (benign prostatic hypertrophy)   . Carpal tunnel syndrome of right wrist   . Coronary artery disease involving native coronary artery of native heart with unstable angina pectoris ([place]) 10/20/2016  . Diverticulitis   . GERD (gastroesophageal reflux disease)   . Gout   . Heart murmur   . Hyperlipemia   . Hypertension   . Postoperative atrial fibrillation ([place]) 10/24/2016  . RLS (restless legs syndrome)   . S/P aortic valve replacement with bioprosthetic valve 10/22/2016   25 mm [facility] Ease bovine pericardial bioprosthetic tissue valve  . S/P ascending aortic aneurysm repair 10/22/2016   28 mm supracoronary straight graft replacement of ascending thoracic aortic aneurysm  . S/P CABG x 3 10/22/2016   Sequential LIMA to Diag and LAD, SVG to distal LAD, open vein harvest right thigh  . S/P mitral valve repair 10/22/2016   Artificial Gore-tex neochord placement x6 - posterior annuloplasty band placed but removed due to systolic anterior motion of mitral valve  . Severe aortic stenosis   . Snores     Never been tested for sleep apnea  . Thoracic ascending aortic aneurysm ([place]) 10/21/2016  . Thrombocytopenia ([place]) 10/22/2016  . Type II diabetes mellitus ([place])     Past Surgical History:  Procedure Laterality Date  . AMPUTATION Bilateral 12/11/2016   Procedure: AMPUTATION BELOW KNEE BILATERALLY;  Surgeon: [name], MD;  Location: [place];  Service: Orthopedics;  Laterality: Bilateral;  . AMPUTATION Bilateral 12/11/2016   Procedure: AMPUTATION BILATERAL HANDS EXCEPT RIGHT THUMB;  Surgeon: [name], MD;  Location: [place];  Service: Orthopedics;  Laterality: Bilateral;  . AORTIC VALVE REPLACEMENT N/A 10/22/2016   Procedure: AORTIC VALVE REPLACEMENT (AVR) WITH SIZE 25 MM MAGNA EASE PERICARDIAL BIOPROSTHESIS - AORTIC;  Surgeon: Rexene Alberts, MD;  Location: [place];  Service: Open Heart Surgery;  Laterality: N/A;  . BONE EXCISION Right 02/01/2017   Procedure: EXCISION RIGHT INDEX METACARPAL HEAD;  Surgeon: [name], MD;  Location: [place];  Service: Orthopedics;  Laterality: Right;  . CARDIAC CATHETERIZATION N/A 10/20/2016   Procedure: Right/Left Heart Cath and Coronary Angiography;  Surgeon: Leonie Man, MD;  Location: [place];  Service: Cardiovascular;  Laterality: N/A;  . CARPAL TUNNEL RELEASE Right 11/28/2013   Procedure: RIGHT WRIST CARPAL TUNNEL RELEASE;  Surgeon: Lorn Junes, MD;  Location: [place];  Service: Orthopedics;  Laterality: Right;  . CATARACT EXTRACTION W/ INTRAOCULAR LENS  IMPLANT, BILATERAL Bilateral 1978  . COLONOSCOPY    .  CORONARY ARTERY BYPASS GRAFT N/A 10/22/2016   Procedure: CORONARY ARTERY BYPASS GRAFTING (CABG)x 2 WITH LIMA TO DIAGONAL, OPEN  HARVESTING OF RIGHT SAPHENOUS VEIN FOR VEIN GRAFT TO LAD;  Surgeon: Rexene Alberts, MD;  Location: [place];  Service: Open Heart Surgery;  Laterality: N/A;  . FRACTURE SURGERY    . INGUINAL HERNIA REPAIR Right 1998  . IR GENERIC HISTORICAL  12/07/2016   IR US GUIDE VASC ACCESS RIGHT  12/07/2016 Aletta Edouard, MD [facility]  . IR GENERIC HISTORICAL  12/07/2016   IR RADIOLOGY PERIPHERAL GUIDED IV START 12/07/2016 Aletta Edouard, MD [facility]  . IR GENERIC HISTORICAL  12/07/2016   IR GASTROSTOMY TUBE MOD SED 12/07/2016 Aletta Edouard, MD [facility]  . LIPOMA EXCISION Right 2008   "side of my head"  . MITRAL VALVE REPAIR N/A 10/22/2016   Procedure: MITRAL VALVE REPAIR (MVR) WITH SIZE 30 SORIN ANNULOFLEX ANNULOPLASTY RING WITH SUBSEQUENT REMOVAL OF RING;  Surgeon: Rexene Alberts, MD;  Location: [place];  Service: Open Heart Surgery;  Laterality: N/A;  . PENECTOMY  2007   Peyronie's disease   . PENILE PROSTHESIS IMPLANT  2009  . REMOVAL OF PENILE PROSTHESIS N/A 02/01/2017   Procedure: REMOVAL OF PENILE PROSTHESIS;  Surgeon: Kathie Rhodes, MD;  Location: [place];  Service: Urology;  Laterality: N/A;  . SHOULDER OPEN ROTATOR CUFF REPAIR Right 2006  . STERNAL CLOSURE N/A 10/26/2016   Procedure: STERNAL WASHOUT AND DELAYED PRIMARY CLOSURE;  Surgeon: Rexene Alberts, MD;  Location: [place];  Service: Thoracic;  Laterality: N/A;  . TEE WITHOUT CARDIOVERSION N/A 10/26/2016   Procedure: TRANSESOPHAGEAL ECHOCARDIOGRAM (TEE);  Surgeon: Rexene Alberts, MD;  Location: [place];  Service: Thoracic;  Laterality: N/A;  . TEE WITHOUT CARDIOVERSION N/A 10/22/2016   Procedure: TRANSESOPHAGEAL ECHOCARDIOGRAM (TEE);  Surgeon: Rexene Alberts, MD;  Location: [place];  Service: Open Heart Surgery;  Laterality: N/A;  . THORACIC AORTIC ANEURYSM REPAIR  10/22/2016   Procedure: ASCENDING AORTIC  ANEURYSM REPAIR (AAA) WITH 28 MM HEMASHIELD PLATINUM WOVEN DOUBLE VELOUR VASCULAR GRAFT;  Surgeon: Rexene Alberts, MD;  Location: [facility];  Service: Open Heart Surgery;;  . TONSILLECTOMY  ~ 1955  . TRACHEOSTOMY TUBE PLACEMENT N/A 11/09/2016   Procedure: TRACHEOSTOMY;  Surgeon: Rexene Alberts, MD;  Location: [place];  Service: Thoracic;  Laterality: N/A;  . TRANSURETHRAL RESECTION OF PROSTATE  2005  . TRIGGER FINGER  RELEASE Bilateral    several lt and rt hands  . TRIGGER FINGER RELEASE Right 11/28/2013   Procedure: RIGHT TRIGGER FINGER  RELEASE (TENDON SHEATH INCISION);  Surgeon: Lorn Junes, MD;  Location: [place];  Service: Orthopedics;  Laterality: Right;  Scott Kitchen VIDEO BRONCHOSCOPY N/A 11/09/2016   Procedure: VIDEO BRONCHOSCOPY;  Surgeon: Rexene Alberts, MD;  Location: [place];  Service: Thoracic;  Laterality: N/A;  . WRIST FRACTURE SURGERY Right ~ 1959    There were no vitals filed for this visit.      Subjective Assessment - 07/05/17 1212    Subjective Pt reports going to prosthetist who turned L foot and made adjustments to the R socket. He tried gloves but too difficult to use with cane so did not bring.  Pt reports wound on L knee is healing and he thinks he got the last bit of suture out.    Patient is accompained by: Family member   Pertinent History arthritis, bicuspid aortic valve, BPH, carpal tunnel syndrome in R wrist, CAD, diverticulitis, GERD, gout,  hyperlipidemia, HTN, DM type II,thrombocytopenia, thoracic ascending aortic aneurysm, severe aortic stenosis, amputation of bilateral hands except right thumb (12/11/2016) due to gangrene, bilateral transtibial amputations (12/11/2016) due to gangrene, R and L heart cath (10/20/2016)    Limitations Standing;Walking;House hold activities   Patient Stated Goals use prostheses to walk including stairs, outdoors, target shooting, drive   Currently in Pain? Yes   Pain Score 5    Pain Location Knee   Pain Orientation Right   Pain Descriptors / Indicators Aching;Sore   Pain Type Acute pain   Pain Onset 1 to 4 weeks ago   Pain Frequency Intermittent   Aggravating Factors  pressure from prosthesis & wound   Pain Relieving Factors removal of prosthesis                         [facility] Adult PT Treatment/Exercise - 07/05/17 1254      Ambulation/Gait   Ambulation/Gait Yes   Ambulation/Gait Assistance 4: Min guard    Ambulation/Gait Assistance Details tactile & verbal cues on upright posture & step length   Ambulation Distance (Feet) 200 Feet   Assistive device Prostheses;Straight cane  SPC secured to R hand using Coban   Gait Pattern Step-to pattern;Decreased arm swing - left;Decreased step length - right   Ambulation Surface Level;Indoor   Stairs Yes   Stairs Assistance 4: Min assist   Stairs Assistance Details (indicate cue type and reason) PT provided visual, verbal and demo cues for sequencing, foot placement and device management up/down stairs. Pt required minA to ensure safety.   Stair Management Technique Step to pattern;Forwards;One rail Left  Holding 2 hands on left rail & cane R hand not supporting   Number of Stairs 4  2 reps   Ramp 4: Min assist  using SPC  & Bil. prostheses   Ramp Details (indicate cue type and reason) PT provided visual, verbal and demo cues for sequencing, foot placement and device management. Pt required min A to ensure safety.   Curb 3: Mod assist  using SPC & bil. prostheses   Curb Details (indicate cue type and reason) Curb simulation using aerobic step with counter to L and cane in R hand. PT provided visual, verbal and demo cues for sequencing, foot placement and device management. Pt required Mod A to ensure safety due to LOB     Prosthetics   Prosthetic Care Comments  Presents today with sore on R knee that appears inflamed with some pus present. Pt and daughter advised to discuss with MD due to possible exposure to grandson's staph infection.    Current prosthetic wear tolerance (days/week)  daily   Current prosthetic wear tolerance (#hours/day)  5 hrs on, 1 hr off rotation for ~15 hrs total /day   Residual limb condition  RLE wound appears inflamed; advised to discuss with MD   Education Provided Prosthetic cleaning;Residual limb care;Skin check   Person(s) Educated Patient;Child(ren)   Education Method Explanation;Verbal cues   Education Method Verbalized  understanding;Verbal cues required;Needs further instruction   Donning Prosthesis Supervision   Doffing Prosthesis Supervision                  PT Short Term Goals - 06/28/17 2212      PT SHORT TERM GOAL #1   Title Patient tolerates bil. LE prostheses wear >75% of awake hours without skin issues.    Time 1   Period Months   Status On-going   Target Date  07/21/17     PT SHORT TERM GOAL #2   Title Patient performs sit to/from stand from chairs without armrests without UE support to stablize modified independent.    Time 1   Period Months   Status On-going   Target Date 07/21/17     PT SHORT TERM GOAL #3   Title Patient reaches 10" anteriorly & within 5" of floor without UE support with supervision.    Time 1   Period Months   Status On-going   Target Date 07/21/17     PT SHORT TERM GOAL #4   Title Patient ambulates 300' with single UE support or less with minA.    Time 1   Period Months   Status On-going   Target Date 07/21/17     PT SHORT TERM GOAL #5   Title Patient negotiates ramps & curbs with cane or less with bil. prostheses with minA.    Time 1   Period Months   Status On-going   Target Date 07/21/17           PT Long Term Goals - 06/21/17 2106      PT LONG TERM GOAL #1   Title Patient demonstrates & verbalizes understanding of proper prosthetic care including independent donning to enable safe use of LE prostheses.    Time 2   Period Months   Status Revised   Target Date 08/20/17     PT LONG TERM GOAL #2   Title Patient will tolerate prostheses wear >90% of awake hours without skin issues or limb pain to demonstrate a decrease in his risk of falling.   Time 2   Period Months   Status On-going   Target Date 08/20/17     PT LONG TERM GOAL #3   Title Patient performs standing balance activities with intermittent UE support reaching 10", picking up objects from floor and looks over shoulders with weight shift, trunk rotation with prostheses  independently.   Time 4   Period Weeks   Status On-going   Target Date 08/20/17     PT LONG TERM GOAL #4   Title Patient will ambulate 1000 feet including outdoor surfaces with LRAD and prostheses modified independently to enable community mobility.     Time 4   Period Months   Status On-going   Target Date 08/20/17     PT LONG TERM GOAL #5   Title Patient will negotiate ramp/curbs and stairs with LRAD and prostheses modified independent for community access.    Time 4   Period Months   Status On-going   Target Date 08/20/17     PT LONG TERM GOAL #6   Title Patient's gait velocity will be >/= 1.76ft/s to indicate a limited community ambulator. (Target Date: 08/20/2017)   Time 4   Period Weeks   Status On-going     PT LONG TERM GOAL #7   Title Patient reports Activities of Balance Confindence score using FOTO >25% to indicate greater confidence in his balance. (Target Date: 08/20/2017)   Time 4   Period Months   Status On-going               Plan - 07/05/17 1307    Clinical Impression Statement Today's session addressed ambulation with SPC in R hand using Coban tape for grip, ascending/descending stairs, ramps and curbs. Pt required min to mod A during session to prevent LOB/falls with visual, verbal and demo cues from PT for sequencing, foot placement and cane  management. Pt continues to make progress toward goals and will benefit from continued therapy to ensure safe function and mobility.   Rehab Potential Good   Clinical Impairments Affecting Rehab Potential arthritis, bicuspid aortic valve, BPH, carpal tunnel syndrome in R wrist, CAD, diverticulitis, GERD, gout, hyperlipidemia, HTN, DM type II,thrombocytopenia, thoracic ascending aortic aneurysm, severe aortic stenosis, amputation of bilateral hands except right thumb (12/11/2016) due to gangrene, bilateral transtibial amputations (12/11/2016) due to gangrene, R and L heart cath (10/20/2016)    PT Frequency 2x / week    PT Duration Other (comment)  60 days (9 weeks)   PT Treatment/Interventions ADLs/Self Care Home Management;Neuromuscular re-education;Balance training;Therapeutic exercise;Therapeutic activities;Functional mobility training;Stair training;Gait training;DME Instruction;Patient/family education;Prosthetic Training   PT Next Visit Plan Continue to progress gait training with SPC, stairs, ramps, curbs, balance activities   Consulted and Agree with Plan of Care Patient;Family member/caregiver   Family Member Consulted daughter       Patient will benefit from skilled therapeutic intervention in order to improve the following deficits and impairments:  Abnormal gait, Decreased activity tolerance, Decreased balance, Decreased coordination, Decreased range of motion, Decreased mobility, Decreased knowledge of use of DME, Decreased knowledge of precautions, Decreased endurance, Decreased skin integrity, Decreased scar mobility, Decreased strength, Difficulty walking, Postural dysfunction, Prosthetic Dependency  Visit Diagnosis: Unsteadiness on feet  Muscle weakness (generalized)  Other abnormalities of gait and mobility  Other symptoms and signs involving the musculoskeletal system  Abnormal posture       G-Codes - Jul 23, 2017 1313    Functional Assessment Tool Used (Outpatient Only) Pt is wearing prostheses 5 hours on, 1 hour off, for a total of 15 hours on per day. Independent to don/doff. Pt requires cues for skin management with wounds on Lt & Rt limbs.    Functional Limitation Self care   Mobility: Walking and Moving Around Current Status [phone]) At least 40 percent but less than 60 percent impaired, limited or restricted   Mobility: Walking and Moving Around Goal Status [phone]) At least 1 percent but less than 20 percent impaired, limited or restricted      Problem List Patient Active Problem List   Diagnosis Date Noted  . Amputation of both hands with complication, subsequent encounter  03/01/2017  . Diabetes ([place]) 02/19/2017  . Status post bilateral below knee amputation ([place]) 02/09/2017  . Wound disruption, post-op, skin, sequela 02/09/2017  . Debility 02/05/2017  . Ganglion upper arm, right   . Thrombosis of both upper extremities   . Suspected heparin induced thrombocytopenia (HIT) in hospitalized patient ([place])   . Gangrene of lower extremity ([place])   . Heparin induced thrombocytopenia (HIT) ([place])   . Tracheostomy status ([place])   . Chest tube in place   . Atherosclerosis of native arteries of extremities with gangrene, left leg ([place])   . Atherosclerosis of native arteries of extremities with gangrene, right leg ([place])   . Acute on chronic diastolic CHF (congestive heart failure) ([place])   . Enteritis due to Clostridium difficile   . Anasarca   . FUO (fever of unknown origin)   . Acute encephalopathy   . Elevated LFTs   . DIC (disseminated intravascular coagulation) ([place]) 10/28/2016  . Postoperative atrial fibrillation ([place]) 10/24/2016  . Cardiogenic shock ([place])   . Mitral regurgitation due to cusp prolapse 10/22/2016  . S/P aortic valve replacement with bioprosthetic valve 10/22/2016  . S/P ascending aortic aneurysm repair 10/22/2016  . S/P mitral valve repair 10/22/2016  . S/P CABG x 3 10/22/2016  .  Thrombocytopenia ([place]) 10/22/2016  . Acute combined systolic and diastolic congestive heart failure ([place]) 10/22/2016  . Acute respiratory failure ([place]) 10/22/2016  . Thoracic ascending aortic aneurysm ([place]) 10/21/2016  . Coronary artery disease involving native coronary artery of native heart with unstable angina pectoris ([place]) 10/20/2016  . Severe aortic stenosis by prior echocardiography 10/19/2016  . Unstable angina ([place]) 10/19/2016  . Aortic valve stenosis 10/19/2016  . Bicuspid aortic valve 10/19/2016  . Trigger ring finger of right hand   . Wears glasses   . Gout   . Hypertension   . Carpal tunnel syndrome of right wrist   . Arthritis   . Snores     Gershon Crane, [place] 07/05/2017, 3:43 PM  Physical Therapy Progress Note  Dates of Reporting Period: 05/31/2017 to 07/05/2017  Objective Reports of Subjective Statement: Patient reports increased wear & use of prostheses in home & community.   Objective Measurements: see above  Goal Update: see above  Plan: continue established plan of care  Reason Skilled Services are Required:Patient requires skilled instruction to advance mobility & reduce fall risk with bil. TTA prostheses.     Jamey Reas, PT, DPT 07/05/2017, 9:03 PM  [facility] [address] [place], [place], [address] Phone: [phone]   Fax:  [phone]  Name: RENOLD KOZAR MRN: [id number] Date of Birth: Nov 25, 1945

## 2017-07-07 ENCOUNTER — Encounter (INDEPENDENT_AMBULATORY_CARE_PROVIDER_SITE_OTHER): Payer: Self-pay | Admitting: Family

## 2017-07-07 ENCOUNTER — Encounter: Payer: Self-pay | Admitting: Physical Therapy

## 2017-07-07 ENCOUNTER — Ambulatory Visit (INDEPENDENT_AMBULATORY_CARE_PROVIDER_SITE_OTHER): Payer: Medicare Other | Admitting: Family

## 2017-07-07 ENCOUNTER — Ambulatory Visit: Payer: Medicare Other | Admitting: Occupational Therapy

## 2017-07-07 ENCOUNTER — Ambulatory Visit: Payer: Medicare Other | Admitting: Physical Therapy

## 2017-07-07 VITALS — Wt 170.0 lb

## 2017-07-07 DIAGNOSIS — Z89511 Acquired absence of right leg below knee: Secondary | ICD-10-CM

## 2017-07-07 DIAGNOSIS — M6281 Muscle weakness (generalized): Secondary | ICD-10-CM | POA: Diagnosis not present

## 2017-07-07 DIAGNOSIS — M79641 Pain in right hand: Secondary | ICD-10-CM

## 2017-07-07 DIAGNOSIS — Z89021 Acquired absence of right finger(s): Secondary | ICD-10-CM | POA: Diagnosis not present

## 2017-07-07 DIAGNOSIS — R293 Abnormal posture: Secondary | ICD-10-CM | POA: Diagnosis not present

## 2017-07-07 DIAGNOSIS — M79642 Pain in left hand: Secondary | ICD-10-CM

## 2017-07-07 DIAGNOSIS — R2681 Unsteadiness on feet: Secondary | ICD-10-CM

## 2017-07-07 DIAGNOSIS — R29898 Other symptoms and signs involving the musculoskeletal system: Secondary | ICD-10-CM

## 2017-07-07 DIAGNOSIS — R2689 Other abnormalities of gait and mobility: Secondary | ICD-10-CM | POA: Diagnosis not present

## 2017-07-07 DIAGNOSIS — S68412D Complete traumatic amputation of left hand at wrist level, subsequent encounter: Principal | ICD-10-CM

## 2017-07-07 DIAGNOSIS — Z89512 Acquired absence of left leg below knee: Secondary | ICD-10-CM

## 2017-07-07 DIAGNOSIS — R208 Other disturbances of skin sensation: Secondary | ICD-10-CM

## 2017-07-07 DIAGNOSIS — Z89022 Acquired absence of left finger(s): Secondary | ICD-10-CM

## 2017-07-07 DIAGNOSIS — S68411D Complete traumatic amputation of right hand at wrist level, subsequent encounter: Secondary | ICD-10-CM

## 2017-07-07 NOTE — Therapy (Signed)
[facility] [address] [place], [place], [address] Phone: [phone]   Fax:  [phone]  Physical Therapy Treatment  Patient Details  Name: Scott [name] MRN: [id number] Date of Birth: 1945-12-02 Referring Provider: Meridee Score MD   Encounter Date: 07/07/2017      PT End of Session - 07/07/17 1100    Visit Number 21   Number of Visits 33   Date for PT Re-Evaluation 08/20/17   Authorization Type Medicare & G-codes every 10th visit    PT Start Time 1100   PT Stop Time 1147   PT Time Calculation (min) 47 min   Equipment Utilized During Treatment Gait belt  SPC   Activity Tolerance Patient tolerated treatment well   Behavior During Therapy [facility] for tasks assessed/performed      Past Medical History:  Diagnosis Date  . Arthritis    "hips, shoulders; knees; back" (10/20/2016)  . Bicuspid aortic valve   . BPH (benign prostatic hypertrophy)   . Carpal tunnel syndrome of right wrist   . Coronary artery disease involving native coronary artery of native heart with unstable angina pectoris ([place]) 10/20/2016  . Diverticulitis   . GERD (gastroesophageal reflux disease)   . Gout   . Heart murmur   . Hyperlipemia   . Hypertension   . Postoperative atrial fibrillation ([place]) 10/24/2016  . RLS (restless legs syndrome)   . S/P aortic valve replacement with bioprosthetic valve 10/22/2016   25 mm [facility] Ease bovine pericardial bioprosthetic tissue valve  . S/P ascending aortic aneurysm repair 10/22/2016   28 mm supracoronary straight graft replacement of ascending thoracic aortic aneurysm  . S/P CABG x 3 10/22/2016   Sequential LIMA to Diag and LAD, SVG to distal LAD, open vein harvest right thigh  . S/P mitral valve repair 10/22/2016   Artificial Gore-tex neochord placement x6 - posterior annuloplasty band placed but removed due to systolic anterior motion of mitral valve  . Severe aortic stenosis   . Snores     Never been tested for sleep apnea  . Thoracic ascending aortic aneurysm ([place]) 10/21/2016  . Thrombocytopenia ([place]) 10/22/2016  . Type II diabetes mellitus ([place])     Past Surgical History:  Procedure Laterality Date  . AMPUTATION Bilateral 12/11/2016   Procedure: AMPUTATION BELOW KNEE BILATERALLY;  Surgeon: [name], MD;  Location: [place];  Service: Orthopedics;  Laterality: Bilateral;  . AMPUTATION Bilateral 12/11/2016   Procedure: AMPUTATION BILATERAL HANDS EXCEPT RIGHT THUMB;  Surgeon: [name], MD;  Location: [place];  Service: Orthopedics;  Laterality: Bilateral;  . AORTIC VALVE REPLACEMENT N/A 10/22/2016   Procedure: AORTIC VALVE REPLACEMENT (AVR) WITH SIZE 25 MM MAGNA EASE PERICARDIAL BIOPROSTHESIS - AORTIC;  Surgeon: Rexene Alberts, MD;  Location: [place];  Service: Open Heart Surgery;  Laterality: N/A;  . BONE EXCISION Right 02/01/2017   Procedure: EXCISION RIGHT INDEX METACARPAL HEAD;  Surgeon: [name], MD;  Location: [place];  Service: Orthopedics;  Laterality: Right;  . CARDIAC CATHETERIZATION N/A 10/20/2016   Procedure: Right/Left Heart Cath and Coronary Angiography;  Surgeon: Leonie Man, MD;  Location: [place];  Service: Cardiovascular;  Laterality: N/A;  . CARPAL TUNNEL RELEASE Right 11/28/2013   Procedure: RIGHT WRIST CARPAL TUNNEL RELEASE;  Surgeon: Lorn Junes, MD;  Location: [place];  Service: Orthopedics;  Laterality: Right;  . CATARACT EXTRACTION W/ INTRAOCULAR LENS  IMPLANT, BILATERAL Bilateral 1978  . COLONOSCOPY    .  CORONARY ARTERY BYPASS GRAFT N/A 10/22/2016   Procedure: CORONARY ARTERY BYPASS GRAFTING (CABG)x 2 WITH LIMA TO DIAGONAL, OPEN  HARVESTING OF RIGHT SAPHENOUS VEIN FOR VEIN GRAFT TO LAD;  Surgeon: Rexene Alberts, MD;  Location: [place];  Service: Open Heart Surgery;  Laterality: N/A;  . FRACTURE SURGERY    . INGUINAL HERNIA REPAIR Right 1998  . IR GENERIC HISTORICAL  12/07/2016   IR US GUIDE VASC ACCESS RIGHT  12/07/2016 Aletta Edouard, MD [facility]  . IR GENERIC HISTORICAL  12/07/2016   IR RADIOLOGY PERIPHERAL GUIDED IV START 12/07/2016 Aletta Edouard, MD [facility]  . IR GENERIC HISTORICAL  12/07/2016   IR GASTROSTOMY TUBE MOD SED 12/07/2016 Aletta Edouard, MD [facility]  . LIPOMA EXCISION Right 2008   "side of my head"  . MITRAL VALVE REPAIR N/A 10/22/2016   Procedure: MITRAL VALVE REPAIR (MVR) WITH SIZE 30 SORIN ANNULOFLEX ANNULOPLASTY RING WITH SUBSEQUENT REMOVAL OF RING;  Surgeon: Rexene Alberts, MD;  Location: [place];  Service: Open Heart Surgery;  Laterality: N/A;  . PENECTOMY  2007   Peyronie's disease   . PENILE PROSTHESIS IMPLANT  2009  . REMOVAL OF PENILE PROSTHESIS N/A 02/01/2017   Procedure: REMOVAL OF PENILE PROSTHESIS;  Surgeon: Kathie Rhodes, MD;  Location: [place];  Service: Urology;  Laterality: N/A;  . SHOULDER OPEN ROTATOR CUFF REPAIR Right 2006  . STERNAL CLOSURE N/A 10/26/2016   Procedure: STERNAL WASHOUT AND DELAYED PRIMARY CLOSURE;  Surgeon: Rexene Alberts, MD;  Location: [place];  Service: Thoracic;  Laterality: N/A;  . TEE WITHOUT CARDIOVERSION N/A 10/26/2016   Procedure: TRANSESOPHAGEAL ECHOCARDIOGRAM (TEE);  Surgeon: Rexene Alberts, MD;  Location: [place] [facility];  Service: Thoracic;  Laterality: N/A;  . TEE WITHOUT CARDIOVERSION N/A 10/22/2016   Procedure: TRANSESOPHAGEAL ECHOCARDIOGRAM (TEE);  Surgeon: Rexene Alberts, MD;  Location: [place];  Service: Open Heart Surgery;  Laterality: N/A;  . THORACIC AORTIC ANEURYSM REPAIR  10/22/2016   Procedure: ASCENDING AORTIC  ANEURYSM REPAIR (AAA) WITH 28 MM HEMASHIELD PLATINUM WOVEN DOUBLE VELOUR VASCULAR GRAFT;  Surgeon: Rexene Alberts, MD;  Location: [facility];  Service: Open Heart Surgery;;  . TONSILLECTOMY  ~ 1955  . TRACHEOSTOMY TUBE PLACEMENT N/A 11/09/2016   Procedure: TRACHEOSTOMY;  Surgeon: Rexene Alberts, MD;  Location: [place];  Service: Thoracic;  Laterality: N/A;  . TRANSURETHRAL RESECTION OF PROSTATE  2005  . TRIGGER FINGER  RELEASE Bilateral    several lt and rt hands  . TRIGGER FINGER RELEASE Right 11/28/2013   Procedure: RIGHT TRIGGER FINGER  RELEASE (TENDON SHEATH INCISION);  Surgeon: Lorn Junes, MD;  Location: [place];  Service: Orthopedics;  Laterality: Right;  Marland Kitchen VIDEO BRONCHOSCOPY N/A 11/09/2016   Procedure: VIDEO BRONCHOSCOPY;  Surgeon: Rexene Alberts, MD;  Location: [facility];  Service: Thoracic;  Laterality: N/A;  . WRIST FRACTURE SURGERY Right ~ 1959    There were no vitals filed for this visit.      Subjective Assessment - 07/07/17 1102    Subjective Pt reports seeing NP at Dr Jess Barters office today for wound on R knee. PCP had prescribed Keflex on Monday. NP today said to keep taking it and applying neosporin. NP removed another 2 pieces of suture from the wound on the L knee.   Pertinent History arthritis, bicuspid aortic valve, BPH, carpal tunnel syndrome in R wrist, CAD, diverticulitis, GERD, gout, hyperlipidemia, HTN, DM type II,thrombocytopenia, thoracic ascending aortic aneurysm, severe aortic stenosis, amputation of bilateral  hands except right thumb (12/11/2016) due to gangrene, bilateral transtibial amputations (12/11/2016) due to gangrene, R and L heart cath (10/20/2016)    Limitations Standing;Walking;House hold activities   Patient Stated Goals use prostheses to walk including stairs, outdoors, target shooting, drive   Currently in Pain? Yes   Pain Score 2    Pain Location Knee   Pain Orientation Right   Pain Descriptors / Indicators Sore  from sore on R knee   Pain Type Acute pain   Pain Onset 1 to 4 weeks ago   Pain Frequency Constant   Aggravating Factors  pressure   Pain Relieving Factors sitting, removal of prosthesis   Multiple Pain Sites Yes   Pain Score 3   Pain Location Knee   Pain Orientation Left   Pain Descriptors / Indicators Sore  from suture removal on L knee   Pain Type Acute pain   Pain Onset Today   Pain Frequency Constant   Aggravating Factors   pressure   Pain Relieving Factors sitting, removing prosthesis                        [facility] Adult PT Treatment/Exercise - 07/07/17 1105      Ambulation/Gait   Ambulation/Gait Yes   Ambulation/Gait Assistance 4: Min assist   Ambulation/Gait Assistance Details visual, verbal and tactile cues for foot placement and cane management    Ambulation Distance (Feet) 275 Feet  275' outside/grass/concrete/ramps/curbs; 80', 175', 55'   Assistive device Prostheses;Straight cane  SPC attached to hand w/ modified walker orthotic   Gait Pattern Step-to pattern;Decreased arm swing - left;Decreased step length - right   Ambulation Surface Level;Unlevel;Indoor;Outdoor;Paved;Grass   Door Management 4: Min assist  from PT to prevent door from closing behind pt   Ramp 4: Min assist  using SPC and LE prostheses   Ramp Details (indicate cue type and reason) PT provided minA for safety/to prevent LOB   Curb 3: Mod assist  using SPC and LE prostheses   Curb Details (indicate cue type and reason) Up/down curb in parking lot x3, alternating lead foot. PT provided visual, verbal, tactile cues for step length and ModA for safety and prevent LOB   Gait Comments Walked around 5 cones 5' apart (x2 laps); side steps between 5 cones 5' apart with 180 deg turns at each cone (x1 lap); and backward walking; total 175'. Walked 275' outside in grass, on ramps, up/down curbs. Walked 67' wearing L UE prosthesis to get used to weight shift. All ambulation using SPC and B LE prostheses with PT providing visual, verbal and tactile cues for foot placement and cane management and minA to modA for safety/to prevent LOB.     Prosthetics   Prosthetic Care Comments  On R knee, wound appears to be healing. On L knee, NP removed more pieces of suture from wound prior to OPPT today. Wound appeared to have another piece of suture present, which PT removed and covered with gauze and tegaderm. Pt instructed to remove gauze  tomorrow.   Current prosthetic wear tolerance (days/week)  daily   Current prosthetic wear tolerance (#hours/day)  5 hrs on, 1 hr off rotation for ~15 hrs total /day   Residual limb condition  RLE wound appears to be healing with less inflammation   Education Provided Other (comment)  Removal of gauze from L LE wound   Person(s) Educated Patient   Education Method Explanation;Verbal cues   Education Method Verbalized understanding;Verbal cues  required   Donning Prosthesis Supervision   Doffing Prosthesis Supervision                PT Education - 07/07/17 1100    Education provided Yes   Education Details Care of L knee wound with instructions to remove gauze tomorrow.    Methods Explanation;Verbal cues   Comprehension Verbalized understanding          PT Short Term Goals - 06/28/17 2212      PT SHORT TERM GOAL #1   Title Patient tolerates bil. LE prostheses wear >75% of awake hours without skin issues.    Time 1   Period Months   Status On-going   Target Date 07/21/17     PT SHORT TERM GOAL #2   Title Patient performs sit to/from stand from chairs without armrests without UE support to stablize modified independent.    Time 1   Period Months   Status On-going   Target Date 07/21/17     PT SHORT TERM GOAL #3   Title Patient reaches 10" anteriorly & within 5" of floor without UE support with supervision.    Time 1   Period Months   Status On-going   Target Date 07/21/17     PT SHORT TERM GOAL #4   Title Patient ambulates 300' with single UE support or less with minA.    Time 1   Period Months   Status On-going   Target Date 07/21/17     PT SHORT TERM GOAL #5   Title Patient negotiates ramps & curbs with cane or less with bil. prostheses with minA.    Time 1   Period Months   Status On-going   Target Date 07/21/17           PT Long Term Goals - 06/21/17 2106      PT LONG TERM GOAL #1   Title Patient demonstrates & verbalizes understanding of  proper prosthetic care including independent donning to enable safe use of LE prostheses.    Time 2   Period Months   Status Revised   Target Date 08/20/17     PT LONG TERM GOAL #2   Title Patient will tolerate prostheses wear >90% of awake hours without skin issues or limb pain to demonstrate a decrease in his risk of falling.   Time 2   Period Months   Status On-going   Target Date 08/20/17     PT LONG TERM GOAL #3   Title Patient performs standing balance activities with intermittent UE support reaching 10", picking up objects from floor and looks over shoulders with weight shift, trunk rotation with prostheses independently.   Time 4   Period Weeks   Status On-going   Target Date 08/20/17     PT LONG TERM GOAL #4   Title Patient will ambulate 1000 feet including outdoor surfaces with LRAD and prostheses modified independently to enable community mobility.     Time 4   Period Months   Status On-going   Target Date 08/20/17     PT LONG TERM GOAL #5   Title Patient will negotiate ramp/curbs and stairs with LRAD and prostheses modified independent for community access.    Time 4   Period Months   Status On-going   Target Date 08/20/17     PT LONG TERM GOAL #6   Title Patient's gait velocity will be >/= 1.52ft/s to indicate a limited community ambulator. (Target Date: 08/20/2017)  Time 4   Period Weeks   Status On-going     PT LONG TERM GOAL #7   Title Patient reports Activities of Balance Confindence score using FOTO >25% to indicate greater confidence in his balance. (Target Date: 08/20/2017)   Time 4   Period Months   Status On-going               Plan - 07/07/17 1110    Clinical Impression Statement Today's session addressed ambulation with SPC and B LE prostheses under a variety of conditions, including around tight corners, side steps, walking backwards, on grass, up/down curbs and ramps, and managing doors. Pt also practiced walking with SPC while wearing  L UE prosthesis to ensure ability to manage shift in balance. PT provided min to modA to prevent LOB/falls and visual, verbal, demo and tactile cues for sequencing, device management and foot placement. Pt is progressing well and will benefit from continued therapy to ensure safe ambulation and functional mobility.   Rehab Potential Good   Clinical Impairments Affecting Rehab Potential arthritis, bicuspid aortic valve, BPH, carpal tunnel syndrome in R wrist, CAD, diverticulitis, GERD, gout, hyperlipidemia, HTN, DM type II,thrombocytopenia, thoracic ascending aortic aneurysm, severe aortic stenosis, amputation of bilateral hands except right thumb (12/11/2016) due to gangrene, bilateral transtibial amputations (12/11/2016) due to gangrene, R and L heart cath (10/20/2016)    PT Frequency 2x / week   PT Duration Other (comment)  60 days (9 weeks)   PT Treatment/Interventions ADLs/Self Care Home Management;Neuromuscular re-education;Balance training;Therapeutic exercise;Therapeutic activities;Functional mobility training;Stair training;Gait training;DME Instruction;Patient/family education;Prosthetic Training   PT Next Visit Plan Ambulation with B LE prostheses, SPC and L UE prosthesis; stairs; walking on a variety of surfaces   Consulted and Agree with Plan of Care Patient;Family member/caregiver   Family Member Consulted --      Patient will benefit from skilled therapeutic intervention in order to improve the following deficits and impairments:  Abnormal gait, Decreased activity tolerance, Decreased balance, Decreased coordination, Decreased range of motion, Decreased mobility, Decreased knowledge of use of DME, Decreased knowledge of precautions, Decreased endurance, Decreased skin integrity, Decreased scar mobility, Decreased strength, Difficulty walking, Postural dysfunction, Prosthetic Dependency  Visit Diagnosis: Unsteadiness on feet  Other abnormalities of gait and mobility  Muscle weakness  (generalized)     Problem List Patient Active Problem List   Diagnosis Date Noted  . Amputation of both hands with complication, subsequent encounter 03/01/2017  . Diabetes ([place]) 02/19/2017  . Status post bilateral below knee amputation ([place]) 02/09/2017  . Wound disruption, post-op, skin, sequela 02/09/2017  . Debility 02/05/2017  . Ganglion upper arm, right   . Thrombosis of both upper extremities   . Suspected heparin induced thrombocytopenia (HIT) in hospitalized patient ([place])   . Gangrene of lower extremity ([place])   . Heparin induced thrombocytopenia (HIT) ([place])   . Tracheostomy status ([place])   . Chest tube in place   . Atherosclerosis of native arteries of extremities with gangrene, left leg ([employer])   . Atherosclerosis of native arteries of extremities with gangrene, right leg ([place])   . Acute on chronic diastolic CHF (congestive heart failure) ([place])   . Enteritis due to Clostridium difficile   . Anasarca   . FUO (fever of unknown origin)   . Acute encephalopathy   . Elevated LFTs   . DIC (disseminated intravascular coagulation) ([place]) 10/28/2016  . Postoperative atrial fibrillation ([place]) 10/24/2016  . Cardiogenic shock ([place])   . Mitral regurgitation due to cusp prolapse 10/22/2016  .  S/P aortic valve replacement with bioprosthetic valve 10/22/2016  . S/P ascending aortic aneurysm repair 10/22/2016  . S/P mitral valve repair 10/22/2016  . S/P CABG x 3 10/22/2016  . Thrombocytopenia ([place]) 10/22/2016  . Acute combined systolic and diastolic congestive heart failure ([place]) 10/22/2016  . Acute respiratory failure ([place]) 10/22/2016  . Thoracic ascending aortic aneurysm ([place]) 10/21/2016  . Coronary artery disease involving native coronary artery of native heart with unstable angina pectoris ([place]) 10/20/2016  . Severe aortic stenosis by prior echocardiography 10/19/2016  . Unstable angina ([place]) 10/19/2016  . Aortic valve stenosis 10/19/2016  . Bicuspid aortic valve 10/19/2016  .  Trigger ring finger of right hand   . Wears glasses   . Gout   . Hypertension   . Carpal tunnel syndrome of right wrist   . Arthritis   . Snores    Gershon Crane, SPT 07/07/2017, 4:41 PM  Jamey Reas, PT, DPT 07/07/2017, 10:58 PM  [facility] [address] [place], [place], [address] Phone: [phone]   Fax:  [phone]  Name: Scott [name] MRN: [id number] Date of Birth: [birth year]

## 2017-07-07 NOTE — Therapy (Signed)
[facility] [address] [place], [place], [address] Phone: [phone]   Fax:  [phone]  Occupational Therapy Treatment  Patient Details  Name: Scott [name] MRN: [id number] Date of Birth: December 06, 1945 Referring Provider: Dr. Meridee Score  Encounter Date: 07/07/2017      OT End of Session - 07/07/17 1355    Visit Number 12   Number of Visits 16   Date for OT Re-Evaluation 07/21/17   Authorization Type medicare pt will need G code and PN every 10th visit   Authorization Time Period 60 days   Authorization - Visit Number 12   Authorization - Number of Visits 20   OT Start Time 3151   OT Stop Time 1228   OT Time Calculation (min) 43 min   Activity Tolerance Patient tolerated treatment well   Behavior During Therapy [facility] for tasks assessed/performed      Past Medical History:  Diagnosis Date  . Arthritis    "hips, shoulders; knees; back" (10/20/2016)  . Bicuspid aortic valve   . BPH (benign prostatic hypertrophy)   . Carpal tunnel syndrome of right wrist   . Coronary artery disease involving native coronary artery of native heart with unstable angina pectoris ([place]) 10/20/2016  . Diverticulitis   . GERD (gastroesophageal reflux disease)   . Gout   . Heart murmur   . Hyperlipemia   . Hypertension   . Postoperative atrial fibrillation ([place]) 10/24/2016  . RLS (restless legs syndrome)   . S/P aortic valve replacement with bioprosthetic valve 10/22/2016   25 mm [facility] Ease bovine pericardial bioprosthetic tissue valve  . S/P ascending aortic aneurysm repair 10/22/2016   28 mm supracoronary straight graft replacement of ascending thoracic aortic aneurysm  . S/P CABG x 3 10/22/2016   Sequential LIMA to Diag and LAD, SVG to distal LAD, open vein harvest right thigh  . S/P mitral valve repair 10/22/2016   Artificial Gore-tex neochord placement x6 - posterior annuloplasty band placed but removed due to  systolic anterior motion of mitral valve  . Severe aortic stenosis   . Snores    Never been tested for sleep apnea  . Thoracic ascending aortic aneurysm ([place]) 10/21/2016  . Thrombocytopenia ([place]) 10/22/2016  . Type II diabetes mellitus ([place])     Past Surgical History:  Procedure Laterality Date  . AMPUTATION Bilateral 12/11/2016   Procedure: AMPUTATION BELOW KNEE BILATERALLY;  Surgeon: [name], MD;  Location: [place];  Service: Orthopedics;  Laterality: Bilateral;  . AMPUTATION Bilateral 12/11/2016   Procedure: AMPUTATION BILATERAL HANDS EXCEPT RIGHT THUMB;  Surgeon: [name], MD;  Location: [place];  Service: Orthopedics;  Laterality: Bilateral;  . AORTIC VALVE REPLACEMENT N/A 10/22/2016   Procedure: AORTIC VALVE REPLACEMENT (AVR) WITH SIZE 25 MM MAGNA EASE PERICARDIAL BIOPROSTHESIS - AORTIC;  Surgeon: Rexene Alberts, MD;  Location: [place];  Service: Open Heart Surgery;  Laterality: N/A;  . BONE EXCISION Right 02/01/2017   Procedure: EXCISION RIGHT INDEX METACARPAL HEAD;  Surgeon: [name], MD;  Location: [place];  Service: Orthopedics;  Laterality: Right;  . CARDIAC CATHETERIZATION N/A 10/20/2016   Procedure: Right/Left Heart Cath and Coronary Angiography;  Surgeon: Leonie Man, MD;  Location: [place] [facility];  Service: Cardiovascular;  Laterality: N/A;  . CARPAL TUNNEL RELEASE Right 11/28/2013   Procedure: RIGHT WRIST CARPAL TUNNEL RELEASE;  Surgeon: Lorn Junes, MD;  Location: [place];  Service: Orthopedics;  Laterality: Right;  .  CATARACT EXTRACTION W/ INTRAOCULAR LENS  IMPLANT, BILATERAL Bilateral 1978  . COLONOSCOPY    . CORONARY ARTERY BYPASS GRAFT N/A 10/22/2016   Procedure: CORONARY ARTERY BYPASS GRAFTING (CABG)x 2 WITH LIMA TO DIAGONAL, OPEN  HARVESTING OF RIGHT SAPHENOUS VEIN FOR VEIN GRAFT TO LAD;  Surgeon: Rexene Alberts, MD;  Location: [place];  Service: Open Heart Surgery;  Laterality: N/A;  . FRACTURE SURGERY    . INGUINAL HERNIA REPAIR  Right 1998  . IR GENERIC HISTORICAL  12/07/2016   IR US GUIDE VASC ACCESS RIGHT 12/07/2016 Aletta Edouard, MD [facility]  . IR GENERIC HISTORICAL  12/07/2016   IR RADIOLOGY PERIPHERAL GUIDED IV START 12/07/2016 Aletta Edouard, MD [facility]  . IR GENERIC HISTORICAL  12/07/2016   IR GASTROSTOMY TUBE MOD SED 12/07/2016 Aletta Edouard, MD [facility]  . LIPOMA EXCISION Right 2008   "side of my head"  . MITRAL VALVE REPAIR N/A 10/22/2016   Procedure: MITRAL VALVE REPAIR (MVR) WITH SIZE 30 SORIN ANNULOFLEX ANNULOPLASTY RING WITH SUBSEQUENT REMOVAL OF RING;  Surgeon: Rexene Alberts, MD;  Location: [place];  Service: Open Heart Surgery;  Laterality: N/A;  . PENECTOMY  2007   Peyronie's disease   . PENILE PROSTHESIS IMPLANT  2009  . REMOVAL OF PENILE PROSTHESIS N/A 02/01/2017   Procedure: REMOVAL OF PENILE PROSTHESIS;  Surgeon: Kathie Rhodes, MD;  Location: [place];  Service: Urology;  Laterality: N/A;  . SHOULDER OPEN ROTATOR CUFF REPAIR Right 2006  . STERNAL CLOSURE N/A 10/26/2016   Procedure: STERNAL WASHOUT AND DELAYED PRIMARY CLOSURE;  Surgeon: Rexene Alberts, MD;  Location: [place];  Service: Thoracic;  Laterality: N/A;  . TEE WITHOUT CARDIOVERSION N/A 10/26/2016   Procedure: TRANSESOPHAGEAL ECHOCARDIOGRAM (TEE);  Surgeon: Rexene Alberts, MD;  Location: [facility];  Service: Thoracic;  Laterality: N/A;  . TEE WITHOUT CARDIOVERSION N/A 10/22/2016   Procedure: TRANSESOPHAGEAL ECHOCARDIOGRAM (TEE);  Surgeon: Rexene Alberts, MD;  Location: [place];  Service: Open Heart Surgery;  Laterality: N/A;  . THORACIC AORTIC ANEURYSM REPAIR  10/22/2016   Procedure: ASCENDING AORTIC  ANEURYSM REPAIR (AAA) WITH 28 MM HEMASHIELD PLATINUM WOVEN DOUBLE VELOUR VASCULAR GRAFT;  Surgeon: Rexene Alberts, MD;  Location: [place];  Service: Open Heart Surgery;;  . TONSILLECTOMY  ~ 1955  . TRACHEOSTOMY TUBE PLACEMENT N/A 11/09/2016   Procedure: TRACHEOSTOMY;  Surgeon: Rexene Alberts, MD;  Location: [facility];  Service: Thoracic;   Laterality: N/A;  . TRANSURETHRAL RESECTION OF PROSTATE  2005  . TRIGGER FINGER RELEASE Bilateral    several lt and rt hands  . TRIGGER FINGER RELEASE Right 11/28/2013   Procedure: RIGHT TRIGGER FINGER  RELEASE (TENDON SHEATH INCISION);  Surgeon: Lorn Junes, MD;  Location: [place];  Service: Orthopedics;  Laterality: Right;  Marland Kitchen VIDEO BRONCHOSCOPY N/A 11/09/2016   Procedure: VIDEO BRONCHOSCOPY;  Surgeon: Rexene Alberts, MD;  Location: [place];  Service: Thoracic;  Laterality: N/A;  . WRIST FRACTURE SURGERY Right ~ 1959    There were no vitals filed for this visit.      Subjective Assessment - 07/07/17 1151    Subjective  Works like a Passenger transport manager!  regarding shower head attachment   Pertinent History see epic. Pt with Bil BKA as well as bilateral UE amputations of all fingers excepet L thumb at MCP joint due to gangrene.    Patient Stated Goals walk, drive a car and go where I want to go. Wife hopes pt can go out  to eat in a restaurant and use public bathroom and eat .    Currently in Pain? Yes   Pain Score 2    Pain Location Knee   Pain Orientation Right   Pain Descriptors / Indicators Tender   Pain Type Chronic pain   Pain Onset 1 to 4 weeks ago   Pain Frequency Constant                      OT Treatments/Exercises (OP) - 07/07/17 1351      ADLs   Functional Mobility Worked on walking in home like setting, with and without cane.  Made further recommendation for hand orthosis on cane - patient's son in law may further adjust thumb portion, and utilize zip tie to help secure grip to cane.  Discussed with patient and PT means to get down onto, and up off of floor.  Did not practice this session - PT demonstrated and discussed method.     Cooking Worked on transporting items that could be crushed - open can of soda, plastic / styrofoalm cup with claw versus hook.  Worked on additional uses of claw and changing orientation of clawfor various functions.                   OT Education - 07/07/17 1354    Education provided Yes   Education Details Reviewed paln to place on hold at this time and to reinstate OT if/when new prosthetic device became available of further treatment warranted to problem solve functional application   Person(s) Educated Patient;Child(ren)   Methods Explanation   Comprehension Verbalized understanding          OT Short Term Goals - 07/07/17 1359      OT SHORT TERM GOAL #1   Title PT and wife will be mod I with home activities program - 06/14/2017   Status Achieved     OT SHORT TERM GOAL #2   Title Pt will be supervision for 3 in 1 commode transfers   Status Achieved     OT SHORT TERM GOAL #3   Title Pt will demonstrate ability to use opposition splint with prn with basic ADL tasks   Status Achieved     OT SHORT TERM GOAL #4   Title Pt will be able to complete simple cold snack prep with min a   Status Achieved           OT Long Term Goals - 07/07/17 1359      OT LONG TERM GOAL #1   Title Pt will be mod I with home activities program - 07/21/2017   Status Achieved     OT LONG TERM GOAL #2   Title Pt will be mod I with toilet transfers   Status Achieved     OT LONG TERM GOAL #3   Title IF pt is able to complete stairs, pt will be mod I with tub bench transfers   Status Achieved     OT LONG TERM GOAL #4   Title Pt will verbalize understanding of driving eval information   Status Achieved     OT LONG TERM GOAL #5   Title Pt will be mod I with toilet hygiene with AE   Status Achieved     OT LONG TERM GOAL #6   Title Pt will be mod I with buttoning, zipping using AE/prostheses   Status Achieved     OT LONG TERM GOAL #7  Title Pt will be mod I with cutting food with AE/prostheses   Status Achieved     OT LONG TERM GOAL #8   Title Pt will be able to write at 3 sentence level with 100% legibility with AE.    Status Achieved     OT LONG TERM GOAL  #9   Baseline Pt will  demonstrate ability to use LUE prosthesis as stabilizer during simple functional tasks   Status Achieved     OT LONG TERM GOAL  #10   TITLE Pt will be mod I with donning and doffing LUE prosthesis.   Status Achieved               Plan - 2017/08/01 1356    Clinical Impression Statement Patient has met many short and long term OT goals at this point and needs further practice with his prostheses.  Patient will benefit from additional OT once done with training mode on current prosthesis and upgraded to new unit.  Patient and family are aware and in agreement to hold further OT services until that time.     Rehab Potential Good   OT Frequency 2x / week   OT Duration 8 weeks   OT Treatment/Interventions Self-care/ADL training;Moist Heat;Cryotherapy;Ultrasound;Fluidtherapy;DME and/or AE instruction;Neuromuscular education;Therapeutic exercise;Therapist, nutritional;Therapeutic activities;Splinting;Patient/family education;Balance training   Plan hold OT until new prosthesis arrives   OT Home Exercise Plan Home activity program - increase tolerance for LUE prosthesis, increase variety of uses through practice   Consulted and Agree with Plan of Care Patient;Family member/caregiver   Family Member Consulted dtr      Patient will benefit from skilled therapeutic intervention in order to improve the following deficits and impairments:  Abnormal gait, Decreased activity tolerance, Decreased balance, Decreased mobility, Decreased knowledge of use of DME, Difficulty walking, Impaired UE functional use, Impaired sensation, Pain, Other (comment)  Visit Diagnosis: Unsteadiness on feet  Muscle weakness (generalized)  Other symptoms and signs involving the musculoskeletal system  Pain in right hand  Pain in left hand  Other disturbances of skin sensation      G-Codes - 2017-08-01 1400    Functional Assessment Tool Used (Outpatient only) skilled clinical observation   Functional  Limitation Self care   Self Care Current Status (M4680) At least 20 percent but less than 40 percent impaired, limited or restricted   Self Care Goal Status (H2122) At least 20 percent but less than 40 percent impaired, limited or restricted   Self Care Discharge Status [phone]) At least 20 percent but less than 40 percent impaired, limited or restricted      Problem List Patient Active Problem List   Diagnosis Date Noted  . Amputation of both hands with complication, subsequent encounter 03/01/2017  . Diabetes ([place]) 02/19/2017  . Status post bilateral below knee amputation ([place]) 02/09/2017  . Wound disruption, post-op, skin, sequela 02/09/2017  . Debility 02/05/2017  . Ganglion upper arm, right   . Thrombosis of both upper extremities   . Suspected heparin induced thrombocytopenia (HIT) in hospitalized patient ([place])   . Gangrene of lower extremity ([place])   . Heparin induced thrombocytopenia (HIT) ([place])   . Tracheostomy status ([place])   . Chest tube in place   . Atherosclerosis of native arteries of extremities with gangrene, left leg ([place])   . Atherosclerosis of native arteries of extremities with gangrene, right leg ([place])   . Acute on chronic diastolic CHF (congestive heart failure) ([place])   . Enteritis due to Clostridium difficile   .  Anasarca   . FUO (fever of unknown origin)   . Acute encephalopathy   . Elevated LFTs   . DIC (disseminated intravascular coagulation) ([place]) 10/28/2016  . Postoperative atrial fibrillation ([place]) 10/24/2016  . Cardiogenic shock ([place])   . Mitral regurgitation due to cusp prolapse 10/22/2016  . S/P aortic valve replacement with bioprosthetic valve 10/22/2016  . S/P ascending aortic aneurysm repair 10/22/2016  . S/P mitral valve repair 10/22/2016  . S/P CABG x 3 10/22/2016  . Thrombocytopenia ([place]) 10/22/2016  . Acute combined systolic and diastolic congestive heart failure ([place]) 10/22/2016  . Acute respiratory failure ([place]) 10/22/2016  . Thoracic  ascending aortic aneurysm ([place]) 10/21/2016  . Coronary artery disease involving native coronary artery of native heart with unstable angina pectoris ([place]) 10/20/2016  . Severe aortic stenosis by prior echocardiography 10/19/2016  . Unstable angina ([place]) 10/19/2016  . Aortic valve stenosis 10/19/2016  . Bicuspid aortic valve 10/19/2016  . Trigger ring finger of right hand   . Wears glasses   . Gout   . Hypertension   . Carpal tunnel syndrome of right wrist   . Arthritis   . Snores     Mariah Milling, OTR/L 07/07/2017, 2:01 PM  [facility] [address] [place], [place], [address] Phone: [phone]   Fax:  [phone]  Name: Scott [name] MRN: [id number] Date of Birth: [birth year]

## 2017-07-07 NOTE — Progress Notes (Signed)
Office Visit Note   Patient: Scott [name]           Date of Birth: 1945/12/03           MRN: [id number] Visit Date: 07/07/2017              Requested by: Sasser, Silvestre Moment, MD [place], [place] [address] PCP: Manon Hilding, MD  Chief Complaint  Patient presents with  . Left Leg - Follow-up  . Right Leg - Follow-up      HPI: The patient is a 71 year old gentleman who presents today for evaluation of an ulcer to his right below the knee amputation. He states that Monday he noticed an open wound over the tibial tuberosity this has had drainage is some surrounding erythema. He is especially skin concerned as his grandson recently had a staph infection. Of note he was started on Keflex on Monday.  He is also concerned about an area on his left below the knee amputation incision. States that there is a open area that has never filled and he has been able to remove some suture material several times concerned that he has not gotten it all out.  Assessment & Plan: Visit Diagnoses:  1. Amputation of both hands with complication, subsequent encounter   2. Status post bilateral below knee amputation ([place])     Plan: Suture material removed from the left incision line. He will continue with his daily wound care and dressing changes. Did advised to minimize use of the prostheses until ulcerations have healed. Complete the course of Keflex. Follow-up in office in 2 weeks should these wounds still be open.  Follow-Up Instructions: Return in about 2 weeks (around 07/21/2017), or if symptoms worsen or fail to improve.   Ortho Exam  Patient is alert, oriented, no adenopathy, well-dressed, normal affect, normal respiratory effort. On examination of the right below the knee amputation there is a small ulceration over the tibial tuberosity duty. This is 3 mm in diameter there is a centimeter of surrounding erythema there is no drainage today. There is exudative tissue in the wound bed. There is  no depth. There is no purulence no ascending cellulitis. To the left below the knee amputation along the center of the incision there is a 3 mm in diameter open area there is some bloody drainage no purulence there is slight surrounding erythema some maceration. Was able to remove some suture material with tweezers from the wound. This ulcer is half a centimeter deep. Does not probe to bone.  Imaging: No results found. No images are attached to the encounter.  Labs: Lab Results  Component Value Date   HGBA1C 6.6 (H) 10/21/2016   REPTSTATUS 02/06/2017 FINAL 02/01/2017   GRAMSTAIN  02/01/2017    ABUNDANT WBC PRESENT, PREDOMINANTLY PMN MODERATE GRAM NEGATIVE RODS    CULT  02/01/2017    FEW PSEUDOMONAS AERUGINOSA MODERATE BACTEROIDES FRAGILIS BETA LACTAMASE POSITIVE    LABORGA PSEUDOMONAS AERUGINOSA 02/01/2017    Orders:  No orders of the defined types were placed in this encounter.  No orders of the defined types were placed in this encounter.    Procedures: No procedures performed  Clinical Data: No additional findings.  ROS:  All other systems negative, except as noted in the HPI. Review of Systems  Constitutional: Negative for chills and fever.  Skin: Positive for color change and wound.    Objective: Vital Signs: Wt 170 lb (77.1 kg)   BMI 32.12 kg/m  Specialty Comments:  No specialty comments available.  PMFS History: Patient Active Problem List   Diagnosis Date Noted  . Amputation of both hands with complication, subsequent encounter 03/01/2017  . Diabetes ([place]) 02/19/2017  . Status post bilateral below knee amputation ([place]) 02/09/2017  . Wound disruption, post-op, skin, sequela 02/09/2017  . Debility 02/05/2017  . Ganglion upper arm, right   . Thrombosis of both upper extremities   . Suspected heparin induced thrombocytopenia (HIT) in hospitalized patient ([place])   . Gangrene of lower extremity ([place])   . Heparin induced thrombocytopenia (HIT) ([place])     . Tracheostomy status ([place])   . Chest tube in place   . Atherosclerosis of native arteries of extremities with gangrene, left leg ([place])   . Atherosclerosis of native arteries of extremities with gangrene, right leg ([place])   . Acute on chronic diastolic CHF (congestive heart failure) ([place])   . Enteritis due to Clostridium difficile   . Anasarca   . FUO (fever of unknown origin)   . Acute encephalopathy   . Elevated LFTs   . DIC (disseminated intravascular coagulation) ([place]) 10/28/2016  . Postoperative atrial fibrillation ([place]) 10/24/2016  . Cardiogenic shock ([place])   . Mitral regurgitation due to cusp prolapse 10/22/2016  . S/P aortic valve replacement with bioprosthetic valve 10/22/2016  . S/P ascending aortic aneurysm repair 10/22/2016  . S/P mitral valve repair 10/22/2016  . S/P CABG x 3 10/22/2016  . Thrombocytopenia ([place]) 10/22/2016  . Acute combined systolic and diastolic congestive heart failure ([place]) 10/22/2016  . Acute respiratory failure ([place]) 10/22/2016  . Thoracic ascending aortic aneurysm ([place]) 10/21/2016  . Coronary artery disease involving native coronary artery of native heart with unstable angina pectoris ([place]) 10/20/2016  . Severe aortic stenosis by prior echocardiography 10/19/2016  . Unstable angina ([place]) 10/19/2016  . Aortic valve stenosis 10/19/2016  . Bicuspid aortic valve 10/19/2016  . Trigger ring finger of right hand   . Wears glasses   . Gout   . Hypertension   . Carpal tunnel syndrome of right wrist   . Arthritis   . Snores    Past Medical History:  Diagnosis Date  . Arthritis    "hips, shoulders; knees; back" (10/20/2016)  . Bicuspid aortic valve   . BPH (benign prostatic hypertrophy)   . Carpal tunnel syndrome of right wrist   . Coronary artery disease involving native coronary artery of native heart with unstable angina pectoris ([place]) 10/20/2016  . Diverticulitis   . GERD (gastroesophageal reflux disease)   . Gout   . Heart murmur   .  Hyperlipemia   . Hypertension   . Postoperative atrial fibrillation ([place]) 10/24/2016  . RLS (restless legs syndrome)   . S/P aortic valve replacement with bioprosthetic valve 10/22/2016   25 mm [facility] Ease bovine pericardial bioprosthetic tissue valve  . S/P ascending aortic aneurysm repair 10/22/2016   28 mm supracoronary straight graft replacement of ascending thoracic aortic aneurysm  . S/P CABG x 3 10/22/2016   Sequential LIMA to Diag and LAD, SVG to distal LAD, open vein harvest right thigh  . S/P mitral valve repair 10/22/2016   Artificial Gore-tex neochord placement x6 - posterior annuloplasty band placed but removed due to systolic anterior motion of mitral valve  . Severe aortic stenosis   . Snores    Never been tested for sleep apnea  . Thoracic ascending aortic aneurysm ([place]) 10/21/2016  . Thrombocytopenia ([place]) 10/22/2016  . Type II diabetes mellitus ([place])  Family History  Problem Relation Age of Onset  . Lung cancer Mother   . Clotting disorder Father        No details  . Heart disease Sister 82       Stents  . Cancer Sister        Throat    Past Surgical History:  Procedure Laterality Date  . AMPUTATION Bilateral 12/11/2016   Procedure: AMPUTATION BELOW KNEE BILATERALLY;  Surgeon: [name], MD;  Location: [place];  Service: Orthopedics;  Laterality: Bilateral;  . AMPUTATION Bilateral 12/11/2016   Procedure: AMPUTATION BILATERAL HANDS EXCEPT RIGHT THUMB;  Surgeon: [name], MD;  Location: [place];  Service: Orthopedics;  Laterality: Bilateral;  . AORTIC VALVE REPLACEMENT N/A 10/22/2016   Procedure: AORTIC VALVE REPLACEMENT (AVR) WITH SIZE 25 MM MAGNA EASE PERICARDIAL BIOPROSTHESIS - AORTIC;  Surgeon: Rexene Alberts, MD;  Location: [place];  Service: Open Heart Surgery;  Laterality: N/A;  . BONE EXCISION Right 02/01/2017   Procedure: EXCISION RIGHT INDEX METACARPAL HEAD;  Surgeon: [name], MD;  Location: [place];  Service: Orthopedics;  Laterality:  Right;  . CARDIAC CATHETERIZATION N/A 10/20/2016   Procedure: Right/Left Heart Cath and Coronary Angiography;  Surgeon: Leonie Man, MD;  Location: [facility];  Service: Cardiovascular;  Laterality: N/A;  . CARPAL TUNNEL RELEASE Right 11/28/2013   Procedure: RIGHT WRIST CARPAL TUNNEL RELEASE;  Surgeon: Lorn Junes, MD;  Location: [place] [place];  Service: Orthopedics;  Laterality: Right;  . CATARACT EXTRACTION W/ INTRAOCULAR LENS  IMPLANT, BILATERAL Bilateral 1978  . COLONOSCOPY    . CORONARY ARTERY BYPASS GRAFT N/A 10/22/2016   Procedure: CORONARY ARTERY BYPASS GRAFTING (CABG)x 2 WITH LIMA TO DIAGONAL, OPEN  HARVESTING OF RIGHT SAPHENOUS VEIN FOR VEIN GRAFT TO LAD;  Surgeon: Rexene Alberts, MD;  Location: [place];  Service: Open Heart Surgery;  Laterality: N/A;  . FRACTURE SURGERY    . INGUINAL HERNIA REPAIR Right 1998  . IR GENERIC HISTORICAL  12/07/2016   IR US GUIDE VASC ACCESS RIGHT 12/07/2016 Aletta Edouard, MD [facility]  . IR GENERIC HISTORICAL  12/07/2016   IR RADIOLOGY PERIPHERAL GUIDED IV START 12/07/2016 Aletta Edouard, MD [facility]  . IR GENERIC HISTORICAL  12/07/2016   IR GASTROSTOMY TUBE MOD SED 12/07/2016 Aletta Edouard, MD [facility]  . LIPOMA EXCISION Right 2008   "side of my head"  . MITRAL VALVE REPAIR N/A 10/22/2016   Procedure: MITRAL VALVE REPAIR (MVR) WITH SIZE 30 SORIN ANNULOFLEX ANNULOPLASTY RING WITH SUBSEQUENT REMOVAL OF RING;  Surgeon: Rexene Alberts, MD;  Location: [place];  Service: Open Heart Surgery;  Laterality: N/A;  . PENECTOMY  2007   Peyronie's disease   . PENILE PROSTHESIS IMPLANT  2009  . REMOVAL OF PENILE PROSTHESIS N/A 02/01/2017   Procedure: REMOVAL OF PENILE PROSTHESIS;  Surgeon: Kathie Rhodes, MD;  Location: [place];  Service: Urology;  Laterality: N/A;  . SHOULDER OPEN ROTATOR CUFF REPAIR Right 2006  . STERNAL CLOSURE N/A 10/26/2016   Procedure: STERNAL WASHOUT AND DELAYED PRIMARY CLOSURE;  Surgeon: Rexene Alberts, MD;   Location: [place];  Service: Thoracic;  Laterality: N/A;  . TEE WITHOUT CARDIOVERSION N/A 10/26/2016   Procedure: TRANSESOPHAGEAL ECHOCARDIOGRAM (TEE);  Surgeon: Rexene Alberts, MD;  Location: [place];  Service: Thoracic;  Laterality: N/A;  . TEE WITHOUT CARDIOVERSION N/A 10/22/2016   Procedure: TRANSESOPHAGEAL ECHOCARDIOGRAM (TEE);  Surgeon: Rexene Alberts, MD;  Location: [place];  Service: Open Heart Surgery;  Laterality: N/A;  . THORACIC AORTIC ANEURYSM REPAIR  10/22/2016   Procedure: ASCENDING AORTIC  ANEURYSM REPAIR (AAA) WITH 28 MM HEMASHIELD PLATINUM WOVEN DOUBLE VELOUR VASCULAR GRAFT;  Surgeon: Rexene Alberts, MD;  Location: [facility];  Service: Open Heart Surgery;;  . TONSILLECTOMY  ~ 1955  . TRACHEOSTOMY TUBE PLACEMENT N/A 11/09/2016   Procedure: TRACHEOSTOMY;  Surgeon: Rexene Alberts, MD;  Location: [place];  Service: Thoracic;  Laterality: N/A;  . TRANSURETHRAL RESECTION OF PROSTATE  2005  . TRIGGER FINGER RELEASE Bilateral    several lt and rt hands  . TRIGGER FINGER RELEASE Right 11/28/2013   Procedure: RIGHT TRIGGER FINGER  RELEASE (TENDON SHEATH INCISION);  Surgeon: Lorn Junes, MD;  Location: [place];  Service: Orthopedics;  Laterality: Right;  Marland Kitchen VIDEO BRONCHOSCOPY N/A 11/09/2016   Procedure: VIDEO BRONCHOSCOPY;  Surgeon: Rexene Alberts, MD;  Location: [facility];  Service: Thoracic;  Laterality: N/A;  . WRIST FRACTURE SURGERY Right ~ 1959   Social History   Occupational History  . [employer]    retired   Social History Main Topics  . Smoking status: Former Smoker    Years: 3.00    Types: Pipe, Cigars    Quit date: 1984  . Smokeless tobacco: Never Used  . Alcohol use 6.0 oz/week    10 Cans of beer per week  . Drug use: No  . Sexual activity: Not Currently

## 2017-07-08 ENCOUNTER — Other Ambulatory Visit: Payer: Self-pay | Admitting: Cardiology

## 2017-07-12 ENCOUNTER — Ambulatory Visit: Payer: Medicare Other | Admitting: Physical Therapy

## 2017-07-12 DIAGNOSIS — R2689 Other abnormalities of gait and mobility: Secondary | ICD-10-CM | POA: Diagnosis not present

## 2017-07-12 DIAGNOSIS — M79641 Pain in right hand: Secondary | ICD-10-CM | POA: Diagnosis not present

## 2017-07-12 DIAGNOSIS — R2681 Unsteadiness on feet: Secondary | ICD-10-CM

## 2017-07-12 DIAGNOSIS — R29898 Other symptoms and signs involving the musculoskeletal system: Secondary | ICD-10-CM

## 2017-07-12 DIAGNOSIS — M6281 Muscle weakness (generalized): Secondary | ICD-10-CM

## 2017-07-12 DIAGNOSIS — R293 Abnormal posture: Secondary | ICD-10-CM

## 2017-07-12 NOTE — Therapy (Signed)
[facility] [address] [place], [place], [address] Phone: [phone]   Fax:  [phone]  Physical Therapy Treatment  Patient Details  Name: Scott [name] MRN: [id number] Date of Birth: 1946/01/10 Referring Provider: Meridee Score MD   Encounter Date: 07/12/2017      PT End of Session - 07/12/17 1101    Visit Number 22   Number of Visits 33   Date for PT Re-Evaluation 08/20/17   Authorization Type Medicare & G-codes every 10th visit    PT Start Time 1100   PT Stop Time 1144   PT Time Calculation (min) 44 min   Equipment Utilized During Treatment Gait belt  SPC   Activity Tolerance Patient tolerated treatment well   Behavior During Therapy [facility] for tasks assessed/performed      Past Medical History:  Diagnosis Date  . Arthritis    "hips, shoulders; knees; back" (10/20/2016)  . Bicuspid aortic valve   . BPH (benign prostatic hypertrophy)   . Carpal tunnel syndrome of right wrist   . Coronary artery disease involving native coronary artery of native heart with unstable angina pectoris ([place]) 10/20/2016  . Diverticulitis   . GERD (gastroesophageal reflux disease)   . Gout   . Heart murmur   . Hyperlipemia   . Hypertension   . Postoperative atrial fibrillation ([place]) 10/24/2016  . RLS (restless legs syndrome)   . S/P aortic valve replacement with bioprosthetic valve 10/22/2016   25 mm [facility] Ease bovine pericardial bioprosthetic tissue valve  . S/P ascending aortic aneurysm repair 10/22/2016   28 mm supracoronary straight graft replacement of ascending thoracic aortic aneurysm  . S/P CABG x 3 10/22/2016   Sequential LIMA to Diag and LAD, SVG to distal LAD, open vein harvest right thigh  . S/P mitral valve repair 10/22/2016   Artificial Gore-tex neochord placement x6 - posterior annuloplasty band placed but removed due to systolic anterior motion of mitral valve  . Severe aortic stenosis   . Snores     Never been tested for sleep apnea  . Thoracic ascending aortic aneurysm ([place]) 10/21/2016  . Thrombocytopenia ([place]) 10/22/2016  . Type II diabetes mellitus ([place])     Past Surgical History:  Procedure Laterality Date  . AMPUTATION Bilateral 12/11/2016   Procedure: AMPUTATION BELOW KNEE BILATERALLY;  Surgeon: [name], MD;  Location: [facility];  Service: Orthopedics;  Laterality: Bilateral;  . AMPUTATION Bilateral 12/11/2016   Procedure: AMPUTATION BILATERAL HANDS EXCEPT RIGHT THUMB;  Surgeon: [name], MD;  Location: [facility];  Service: Orthopedics;  Laterality: Bilateral;  . AORTIC VALVE REPLACEMENT N/A 10/22/2016   Procedure: AORTIC VALVE REPLACEMENT (AVR) WITH SIZE 25 MM MAGNA EASE PERICARDIAL BIOPROSTHESIS - AORTIC;  Surgeon: Rexene Alberts, MD;  Location: [facility];  Service: Open Heart Surgery;  Laterality: N/A;  . BONE EXCISION Right 02/01/2017   Procedure: EXCISION RIGHT INDEX METACARPAL HEAD;  Surgeon: [name], MD;  Location: [facility];  Service: Orthopedics;  Laterality: Right;  . CARDIAC CATHETERIZATION N/A 10/20/2016   Procedure: Right/Left Heart Cath and Coronary Angiography;  Surgeon: Leonie Man, MD;  Location: [facility];  Service: Cardiovascular;  Laterality: N/A;  . CARPAL TUNNEL RELEASE Right 11/28/2013   Procedure: RIGHT WRIST CARPAL TUNNEL RELEASE;  Surgeon: Lorn Junes, MD;  Location: [facility];  Service: Orthopedics;  Laterality: Right;  . CATARACT EXTRACTION W/ INTRAOCULAR LENS  IMPLANT, BILATERAL Bilateral 1978  . COLONOSCOPY    .  CORONARY ARTERY BYPASS GRAFT N/A 10/22/2016   Procedure: CORONARY ARTERY BYPASS GRAFTING (CABG)x 2 WITH LIMA TO DIAGONAL, OPEN  HARVESTING OF RIGHT SAPHENOUS VEIN FOR VEIN GRAFT TO LAD;  Surgeon: Rexene Alberts, MD;  Location: [facility];  Service: Open Heart Surgery;  Laterality: N/A;  . FRACTURE SURGERY    . INGUINAL HERNIA REPAIR Right 1998  . IR GENERIC HISTORICAL  12/07/2016   IR US GUIDE VASC ACCESS RIGHT  12/07/2016 Aletta Edouard, MD [facility]  . IR GENERIC HISTORICAL  12/07/2016   IR RADIOLOGY PERIPHERAL GUIDED IV START 12/07/2016 Aletta Edouard, MD [facility]  . IR GENERIC HISTORICAL  12/07/2016   IR GASTROSTOMY TUBE MOD SED 12/07/2016 Aletta Edouard, MD [facility]  . LIPOMA EXCISION Right 2008   "side of my head"  . MITRAL VALVE REPAIR N/A 10/22/2016   Procedure: MITRAL VALVE REPAIR (MVR) WITH SIZE 30 SORIN ANNULOFLEX ANNULOPLASTY RING WITH SUBSEQUENT REMOVAL OF RING;  Surgeon: Rexene Alberts, MD;  Location: [facility];  Service: Open Heart Surgery;  Laterality: N/A;  . PENECTOMY  2007   Peyronie's disease   . PENILE PROSTHESIS IMPLANT  2009  . REMOVAL OF PENILE PROSTHESIS N/A 02/01/2017   Procedure: REMOVAL OF PENILE PROSTHESIS;  Surgeon: Kathie Rhodes, MD;  Location: [facility];  Service: Urology;  Laterality: N/A;  . SHOULDER OPEN ROTATOR CUFF REPAIR Right 2006  . STERNAL CLOSURE N/A 10/26/2016   Procedure: STERNAL WASHOUT AND DELAYED PRIMARY CLOSURE;  Surgeon: Rexene Alberts, MD;  Location: [facility];  Service: Thoracic;  Laterality: N/A;  . TEE WITHOUT CARDIOVERSION N/A 10/26/2016   Procedure: TRANSESOPHAGEAL ECHOCARDIOGRAM (TEE);  Surgeon: Rexene Alberts, MD;  Location: [facility];  Service: Thoracic;  Laterality: N/A;  . TEE WITHOUT CARDIOVERSION N/A 10/22/2016   Procedure: TRANSESOPHAGEAL ECHOCARDIOGRAM (TEE);  Surgeon: Rexene Alberts, MD;  Location: [facility];  Service: Open Heart Surgery;  Laterality: N/A;  . THORACIC AORTIC ANEURYSM REPAIR  10/22/2016   Procedure: ASCENDING AORTIC  ANEURYSM REPAIR (AAA) WITH 28 MM HEMASHIELD PLATINUM WOVEN DOUBLE VELOUR VASCULAR GRAFT;  Surgeon: Rexene Alberts, MD;  Location: [facility];  Service: Open Heart Surgery;;  . TONSILLECTOMY  ~ 1955  . TRACHEOSTOMY TUBE PLACEMENT N/A 11/09/2016   Procedure: TRACHEOSTOMY;  Surgeon: Rexene Alberts, MD;  Location: [facility];  Service: Thoracic;  Laterality: N/A;  . TRANSURETHRAL RESECTION OF PROSTATE  2005  . TRIGGER FINGER  RELEASE Bilateral    several lt and rt hands  . TRIGGER FINGER RELEASE Right 11/28/2013   Procedure: RIGHT TRIGGER FINGER  RELEASE (TENDON SHEATH INCISION);  Surgeon: Lorn Junes, MD;  Location: [facility];  Service: Orthopedics;  Laterality: Right;  Marland Kitchen VIDEO BRONCHOSCOPY N/A 11/09/2016   Procedure: VIDEO BRONCHOSCOPY;  Surgeon: Rexene Alberts, MD;  Location: [facility];  Service: Thoracic;  Laterality: N/A;  . WRIST FRACTURE SURGERY Right ~ 1959    There were no vitals filed for this visit.      Subjective Assessment - 07/12/17 1101    Subjective Pt reports no problems or complaints. Has been doing some walking with the UE prosthesis on to get used to it.   Pertinent History arthritis, bicuspid aortic valve, BPH, carpal tunnel syndrome in R wrist, CAD, diverticulitis, GERD, gout, hyperlipidemia, HTN, DM type II,thrombocytopenia, thoracic ascending aortic aneurysm, severe aortic stenosis, amputation of bilateral hands except right thumb (12/11/2016) due to gangrene, bilateral transtibial amputations (12/11/2016) due to gangrene, R and L heart cath (10/20/2016)  Limitations Standing;Walking;House hold activities   Patient Stated Goals use prostheses to walk including stairs, outdoors, target shooting, drive   Currently in Pain? No/denies   Pain Onset 1 to 4 weeks ago   Pain Onset Today                         [facility] Adult PT Treatment/Exercise - 07/12/17 1101      Transfers   Transfers Floor to Transfer   Floor to Transfer 4: Min assist;With upper extremity assist;4: Min guard;Other (comment)  without L UE prosthesis, pushing on mat table   Floor to Transfer Details (indicate cue type and reason) Demo, visual, verbal cues for technique including sequencing, foot placement and movement   Transfer Cueing --   Comments Performed floor<>standing transfers at mat using B UEs; performed one rep leading with RLE and one leading with LLE     Ambulation/Gait    Ambulation/Gait Yes   Ambulation/Gait Assistance 4: Min assist   Ambulation/Gait Assistance Details Visual, verbal, tactile cues for pelvic rotation/orientation to control prosthetic foot toe-out "aiming the headlights";  worked on carrying empty cup with LUE prosthesis   Ambulation Distance (Feet) 405 Feet  405' inside; 250' outside ramps/curbs/15' grass; 80' inside   Assistive device Straight cane;Prostheses  SPC w/modified hand orthotic, BLE & LUE prostheses   Gait Pattern Decreased arm swing - left;Decreased step length - right;Decreased trunk rotation;Step-through pattern;Decreased stride length;Trunk rotated posteriorly on right   Ambulation Surface Level;Unlevel;Indoor;Outdoor;Paved;Grass   Door Management 4: Min assist  cane, BLE & LUE prostheses   Door Managment Details (indicate cue type and reason) PT provided minA & verbal cues to initiate door opening and to prevent LOB from door swing   Ramp 4: Min assist   Ramp Details (indicate cue type and reason) PT provided verbal cues to manage step length, upright posture & wt shift while going up/down ramp with minA for safety/to prevent LOB   Curb 4: Min assist  cane, BLE & LUE prostheses   Curb Details (indicate cue type and reason) 6" curb in parking lot x4 alternating lead foot; PT provided visual, verbal, demo and tactile cues for sequencing and foot/cane placement including step thru with minA for safety/prevent LOB     Prosthetics   Prosthetic Care Comments  PT instructed to trial no Tegaderm on LLE wound and reapply if wound appears to open back up   Current prosthetic wear tolerance (days/week)  daily   Current prosthetic wear tolerance (#hours/day)  5 hrs on, 1 hr off rotation for ~15 hrs total /day   Residual limb condition  RLE tibial tubercle wound with mild eschar with red (decreased intensity) perimeter;   LLE incision wound with no openings /appears healed   Education Provided Residual limb care;Proper wear  schedule/adjustment   Person(s) Educated Patient   Education Method Explanation;Verbal cues   Education Method Verbalized understanding   Donning Prosthesis Modified independent (device/increased time)                PT Education - 07/12/17 1101    Education provided Yes   Education Details Wound care, with instructions not to reapply Tegaderm to L knee wound after removal this evening.    Person(s) Educated Patient;Child(ren)   Methods Explanation   Comprehension Verbalized understanding          PT Short Term Goals - 06/28/17 2212      PT SHORT TERM GOAL #1   Title Patient  tolerates bil. LE prostheses wear >75% of awake hours without skin issues.    Time 1   Period Months   Status On-going   Target Date 07/21/17     PT SHORT TERM GOAL #2   Title Patient performs sit to/from stand from chairs without armrests without UE support to stablize modified independent.    Time 1   Period Months   Status On-going   Target Date 07/21/17     PT SHORT TERM GOAL #3   Title Patient reaches 10" anteriorly & within 5" of floor without UE support with supervision.    Time 1   Period Months   Status On-going   Target Date 07/21/17     PT SHORT TERM GOAL #4   Title Patient ambulates 300' with single UE support or less with minA.    Time 1   Period Months   Status On-going   Target Date 07/21/17     PT SHORT TERM GOAL #5   Title Patient negotiates ramps & curbs with cane or less with bil. prostheses with minA.    Time 1   Period Months   Status On-going   Target Date 07/21/17           PT Long Term Goals - 06/21/17 2106      PT LONG TERM GOAL #1   Title Patient demonstrates & verbalizes understanding of proper prosthetic care including independent donning to enable safe use of LE prostheses.    Time 2   Period Months   Status Revised   Target Date 08/20/17     PT LONG TERM GOAL #2   Title Patient will tolerate prostheses wear >90% of awake hours without  skin issues or limb pain to demonstrate a decrease in his risk of falling.   Time 2   Period Months   Status On-going   Target Date 08/20/17     PT LONG TERM GOAL #3   Title Patient performs standing balance activities with intermittent UE support reaching 10", picking up objects from floor and looks over shoulders with weight shift, trunk rotation with prostheses independently.   Time 4   Period Weeks   Status On-going   Target Date 08/20/17     PT LONG TERM GOAL #4   Title Patient will ambulate 1000 feet including outdoor surfaces with LRAD and prostheses modified independently to enable community mobility.     Time 4   Period Months   Status On-going   Target Date 08/20/17     PT LONG TERM GOAL #5   Title Patient will negotiate ramp/curbs and stairs with LRAD and prostheses modified independent for community access.    Time 4   Period Months   Status On-going   Target Date 08/20/17     PT LONG TERM GOAL #6   Title Patient's gait velocity will be >/= 1.41ft/s to indicate a limited community ambulator. (Target Date: 08/20/2017)   Time 4   Period Weeks   Status On-going     PT LONG TERM GOAL #7   Title Patient reports Activities of Balance Confindence score using FOTO >25% to indicate greater confidence in his balance. (Target Date: 08/20/2017)   Time 4   Period Months   Status On-going               Plan - 07/12/17 1101    Clinical Impression Statement Today's session addressed floor<>standing transfers as well as a variety of indoor and outdoor ambulation  tasks, including negotiating ramps and curbs, while wearing L UE prosthesis and using it to carry objects and manage doors. PT provided minA to prevent falls/LOB with demo, visual, verbal and tactile cues for sequencing, pelvic rotation and foot/SPC placement. Pt continues to show improvement and will benefit from continued therapy to ensure safe ambulation and functional mobility.   Rehab Potential Good   Clinical  Impairments Affecting Rehab Potential arthritis, bicuspid aortic valve, BPH, carpal tunnel syndrome in R wrist, CAD, diverticulitis, GERD, gout, hyperlipidemia, HTN, DM type II,thrombocytopenia, thoracic ascending aortic aneurysm, severe aortic stenosis, amputation of bilateral hands except right thumb (12/11/2016) due to gangrene, bilateral transtibial amputations (12/11/2016) due to gangrene, R and L heart cath (10/20/2016)    PT Frequency 2x / week   PT Duration Other (comment)  60 days (9 weeks)   PT Treatment/Interventions ADLs/Self Care Home Management;Neuromuscular re-education;Balance training;Therapeutic exercise;Therapeutic activities;Functional mobility training;Stair training;Gait training;DME Instruction;Patient/family education;Prosthetic Training   PT Next Visit Plan Progress ambulation with B LE prostheses, SPC and L UE prosthesis around obstacles and on various surfaces; stairs   Consulted and Agree with Plan of Care Patient;Family member/caregiver      Patient will benefit from skilled therapeutic intervention in order to improve the following deficits and impairments:  Abnormal gait, Decreased activity tolerance, Decreased balance, Decreased coordination, Decreased range of motion, Decreased mobility, Decreased knowledge of use of DME, Decreased knowledge of precautions, Decreased endurance, Decreased skin integrity, Decreased scar mobility, Decreased strength, Difficulty walking, Postural dysfunction, Prosthetic Dependency  Visit Diagnosis: Unsteadiness on feet  Muscle weakness (generalized)  Other abnormalities of gait and mobility  Other symptoms and signs involving the musculoskeletal system  Abnormal posture     Problem List Patient Active Problem List   Diagnosis Date Noted  . Amputation of both hands with complication, subsequent encounter 03/01/2017  . Diabetes ([place]) 02/19/2017  . Status post bilateral below knee amputation ([place]) 02/09/2017  . Wound  disruption, post-op, skin, sequela 02/09/2017  . Debility 02/05/2017  . Ganglion upper arm, right   . Thrombosis of both upper extremities   . Suspected heparin induced thrombocytopenia (HIT) in hospitalized patient ([place])   . Gangrene of lower extremity ([place])   . Heparin induced thrombocytopenia (HIT) ([place])   . Tracheostomy status ([place])   . Chest tube in place   . Atherosclerosis of native arteries of extremities with gangrene, left leg ([place])   . Atherosclerosis of native arteries of extremities with gangrene, right leg ([place])   . Acute on chronic diastolic CHF (congestive heart failure) ([place])   . Enteritis due to Clostridium difficile   . Anasarca   . FUO (fever of unknown origin)   . Acute encephalopathy   . Elevated LFTs   . DIC (disseminated intravascular coagulation) ([place]) 10/28/2016  . Postoperative atrial fibrillation ([place]) 10/24/2016  . Cardiogenic shock ([place])   . Mitral regurgitation due to cusp prolapse 10/22/2016  . S/P aortic valve replacement with bioprosthetic valve 10/22/2016  . S/P ascending aortic aneurysm repair 10/22/2016  . S/P mitral valve repair 10/22/2016  . S/P CABG x 3 10/22/2016  . Thrombocytopenia ([place]) 10/22/2016  . Acute combined systolic and diastolic congestive heart failure ([place]) 10/22/2016  . Acute respiratory failure ([place]) 10/22/2016  . Thoracic ascending aortic aneurysm ([place]) 10/21/2016  . Coronary artery disease involving native coronary artery of native heart with unstable angina pectoris ([place]) 10/20/2016  . Severe aortic stenosis by prior echocardiography 10/19/2016  . Unstable angina ([place]) 10/19/2016  . Aortic valve stenosis 10/19/2016  .  Bicuspid aortic valve 10/19/2016  . Trigger ring finger of right hand   . Wears glasses   . Gout   . Hypertension   . Carpal tunnel syndrome of right wrist   . Arthritis   . Snores    Gershon Crane, SPT 07/12/2017, 1:43 PM  Jamey Reas, PT, DPT 07/12/2017, 10:40 PM  [facility] [address] [place], [place], [address] Phone: [phone]   Fax:  [phone]  Name: Scott [name] MRN: [id number] Date of Birth: 04-18-1946

## 2017-07-14 ENCOUNTER — Ambulatory Visit: Payer: Medicare Other | Admitting: Physical Therapy

## 2017-07-14 DIAGNOSIS — M79641 Pain in right hand: Secondary | ICD-10-CM | POA: Diagnosis not present

## 2017-07-14 DIAGNOSIS — R2681 Unsteadiness on feet: Secondary | ICD-10-CM

## 2017-07-14 DIAGNOSIS — R29898 Other symptoms and signs involving the musculoskeletal system: Secondary | ICD-10-CM | POA: Diagnosis not present

## 2017-07-14 DIAGNOSIS — R2689 Other abnormalities of gait and mobility: Secondary | ICD-10-CM | POA: Diagnosis not present

## 2017-07-14 DIAGNOSIS — R293 Abnormal posture: Secondary | ICD-10-CM | POA: Diagnosis not present

## 2017-07-14 DIAGNOSIS — M6281 Muscle weakness (generalized): Secondary | ICD-10-CM | POA: Diagnosis not present

## 2017-07-14 NOTE — Therapy (Signed)
[facility] [address] [place], [place], [address] Phone: [phone]   Fax:  [phone]  Physical Therapy Treatment  Patient Details  Name: Scott [name] MRN: [id number] Date of Birth: 06-Sep-1946 Referring Provider: Meridee Score MD   Encounter Date: 07/14/2017      PT End of Session - 07/14/17 1105    Visit Number 23   Number of Visits 33   Date for PT Re-Evaluation 08/20/17   Authorization Type Medicare & G-codes every 10th visit    PT Start Time 1100   PT Stop Time 1145   PT Time Calculation (min) 45 min   Equipment Utilized During Treatment Gait belt  SPC   Activity Tolerance Patient tolerated treatment well   Behavior During Therapy [facility] for tasks assessed/performed      Past Medical History:  Diagnosis Date  . Arthritis    "hips, shoulders; knees; back" (10/20/2016)  . Bicuspid aortic valve   . BPH (benign prostatic hypertrophy)   . Carpal tunnel syndrome of right wrist   . Coronary artery disease involving native coronary artery of native heart with unstable angina pectoris ([place]) 10/20/2016  . Diverticulitis   . GERD (gastroesophageal reflux disease)   . Gout   . Heart murmur   . Hyperlipemia   . Hypertension   . Postoperative atrial fibrillation ([place]) 10/24/2016  . RLS (restless legs syndrome)   . S/P aortic valve replacement with bioprosthetic valve 10/22/2016   25 mm [facility] Ease bovine pericardial bioprosthetic tissue valve  . S/P ascending aortic aneurysm repair 10/22/2016   28 mm supracoronary straight graft replacement of ascending thoracic aortic aneurysm  . S/P CABG x 3 10/22/2016   Sequential LIMA to Diag and LAD, SVG to distal LAD, open vein harvest right thigh  . S/P mitral valve repair 10/22/2016   Artificial Gore-tex neochord placement x6 - posterior annuloplasty band placed but removed due to systolic anterior motion of mitral valve  . Severe aortic stenosis   . Snores     Never been tested for sleep apnea  . Thoracic ascending aortic aneurysm ([place]) 10/21/2016  . Thrombocytopenia ([place]) 10/22/2016  . Type II diabetes mellitus ([place])     Past Surgical History:  Procedure Laterality Date  . AMPUTATION Bilateral 12/11/2016   Procedure: AMPUTATION BELOW KNEE BILATERALLY;  Surgeon: [name], MD;  Location: [place];  Service: Orthopedics;  Laterality: Bilateral;  . AMPUTATION Bilateral 12/11/2016   Procedure: AMPUTATION BILATERAL HANDS EXCEPT RIGHT THUMB;  Surgeon: [name], MD;  Location: [place];  Service: Orthopedics;  Laterality: Bilateral;  . AORTIC VALVE REPLACEMENT N/A 10/22/2016   Procedure: AORTIC VALVE REPLACEMENT (AVR) WITH SIZE 25 MM MAGNA EASE PERICARDIAL BIOPROSTHESIS - AORTIC;  Surgeon: Rexene Alberts, MD;  Location: [place];  Service: Open Heart Surgery;  Laterality: N/A;  . BONE EXCISION Right 02/01/2017   Procedure: EXCISION RIGHT INDEX METACARPAL HEAD;  Surgeon: [name], MD;  Location: [place];  Service: Orthopedics;  Laterality: Right;  . CARDIAC CATHETERIZATION N/A 10/20/2016   Procedure: Right/Left Heart Cath and Coronary Angiography;  Surgeon: Leonie Man, MD;  Location: [place] [facility];  Service: Cardiovascular;  Laterality: N/A;  . CARPAL TUNNEL RELEASE Right 11/28/2013   Procedure: RIGHT WRIST CARPAL TUNNEL RELEASE;  Surgeon: Lorn Junes, MD;  Location: [place];  Service: Orthopedics;  Laterality: Right;  . CATARACT EXTRACTION W/ INTRAOCULAR LENS  IMPLANT, BILATERAL Bilateral 1978  . COLONOSCOPY    .  CORONARY ARTERY BYPASS GRAFT N/A 10/22/2016   Procedure: CORONARY ARTERY BYPASS GRAFTING (CABG)x 2 WITH LIMA TO DIAGONAL, OPEN  HARVESTING OF RIGHT SAPHENOUS VEIN FOR VEIN GRAFT TO LAD;  Surgeon: Rexene Alberts, MD;  Location: [facility];  Service: Open Heart Surgery;  Laterality: N/A;  . FRACTURE SURGERY    . INGUINAL HERNIA REPAIR Right 1998  . IR GENERIC HISTORICAL  12/07/2016   IR US GUIDE VASC ACCESS RIGHT  12/07/2016 Aletta Edouard, MD [facility]  . IR GENERIC HISTORICAL  12/07/2016   IR RADIOLOGY PERIPHERAL GUIDED IV START 12/07/2016 Aletta Edouard, MD [facility]  . IR GENERIC HISTORICAL  12/07/2016   IR GASTROSTOMY TUBE MOD SED 12/07/2016 Aletta Edouard, MD [facility]  . LIPOMA EXCISION Right 2008   "side of my head"  . MITRAL VALVE REPAIR N/A 10/22/2016   Procedure: MITRAL VALVE REPAIR (MVR) WITH SIZE 30 SORIN ANNULOFLEX ANNULOPLASTY RING WITH SUBSEQUENT REMOVAL OF RING;  Surgeon: Rexene Alberts, MD;  Location: [place];  Service: Open Heart Surgery;  Laterality: N/A;  . PENECTOMY  2007   Peyronie's disease   . PENILE PROSTHESIS IMPLANT  2009  . REMOVAL OF PENILE PROSTHESIS N/A 02/01/2017   Procedure: REMOVAL OF PENILE PROSTHESIS;  Surgeon: Kathie Rhodes, MD;  Location: [place];  Service: Urology;  Laterality: N/A;  . SHOULDER OPEN ROTATOR CUFF REPAIR Right 2006  . STERNAL CLOSURE N/A 10/26/2016   Procedure: STERNAL WASHOUT AND DELAYED PRIMARY CLOSURE;  Surgeon: Rexene Alberts, MD;  Location: [place];  Service: Thoracic;  Laterality: N/A;  . TEE WITHOUT CARDIOVERSION N/A 10/26/2016   Procedure: TRANSESOPHAGEAL ECHOCARDIOGRAM (TEE);  Surgeon: Rexene Alberts, MD;  Location: [place];  Service: Thoracic;  Laterality: N/A;  . TEE WITHOUT CARDIOVERSION N/A 10/22/2016   Procedure: TRANSESOPHAGEAL ECHOCARDIOGRAM (TEE);  Surgeon: Rexene Alberts, MD;  Location: [place];  Service: Open Heart Surgery;  Laterality: N/A;  . THORACIC AORTIC ANEURYSM REPAIR  10/22/2016   Procedure: ASCENDING AORTIC  ANEURYSM REPAIR (AAA) WITH 28 MM HEMASHIELD PLATINUM WOVEN DOUBLE VELOUR VASCULAR GRAFT;  Surgeon: Rexene Alberts, MD;  Location: [place];  Service: Open Heart Surgery;;  . TONSILLECTOMY  ~ 1955  . TRACHEOSTOMY TUBE PLACEMENT N/A 11/09/2016   Procedure: TRACHEOSTOMY;  Surgeon: Rexene Alberts, MD;  Location: [place];  Service: Thoracic;  Laterality: N/A;  . TRANSURETHRAL RESECTION OF PROSTATE  2005  . TRIGGER FINGER  RELEASE Bilateral    several lt and rt hands  . TRIGGER FINGER RELEASE Right 11/28/2013   Procedure: RIGHT TRIGGER FINGER  RELEASE (TENDON SHEATH INCISION);  Surgeon: Lorn Junes, MD;  Location: [place];  Service: Orthopedics;  Laterality: Right;  Marland Kitchen VIDEO BRONCHOSCOPY N/A 11/09/2016   Procedure: VIDEO BRONCHOSCOPY;  Surgeon: Rexene Alberts, MD;  Location: [place];  Service: Thoracic;  Laterality: N/A;  . WRIST FRACTURE SURGERY Right ~ 1959    There were no vitals filed for this visit.      Subjective Assessment - 07/14/17 1104    Subjective Pt reports no problems or pain. Feels like the RLE knee wound is continuing to heal well   Pertinent History arthritis, bicuspid aortic valve, BPH, carpal tunnel syndrome in R wrist, CAD, diverticulitis, GERD, gout, hyperlipidemia, HTN, DM type II,thrombocytopenia, thoracic ascending aortic aneurysm, severe aortic stenosis, amputation of bilateral hands except right thumb (12/11/2016) due to gangrene, bilateral transtibial amputations (12/11/2016) due to gangrene, R and L heart cath (10/20/2016)    Limitations Standing;Walking;House hold  activities   Patient Stated Goals use prostheses to walk including stairs, outdoors, target shooting, drive   Currently in Pain? No/denies   Pain Onset 1 to 4 weeks ago   Pain Onset Today             Prosthetics Assessment - 07/14/17 1105      Prosthetics   Prosthetic Care Comments  PT instructed pt to keep Tegaderm on RLE wound during the day and to leave it off at night. Provided with additional Tegaderm sheets.   Residual limb condition  RLE wound on tibial tuberosity shows mininal slough and decreased redness at perimeter. LLE incision wound closed/appears healed.                    [facility] Adult PT Treatment/Exercise - 07/14/17 1105      Transfers   Sit to Stand 4: Min assist;From chair/3-in-1;With upper extremity assist;Other (comment)  from chair w/o armrests   Sit to  Stand Details Verbal cues for sequencing;Verbal cues for technique;Verbal cues for safe use of DME/AE;Manual facilitation for weight shifting   Stand to Sit 4: Min guard;With upper extremity assist;To chair/3-in-1;Other (comment)  to chair w/out arm rests     Ambulation/Gait   Ambulation/Gait Yes   Ambulation/Gait Assistance 4: Min guard   Ambulation/Gait Assistance Details Tactile cues for pelvic rotation   Ambulation Distance (Feet) 200 Feet   Assistive device Straight cane;Prostheses  SPC w/ modified hand orthotic; BLE & LUE prostheses   Gait Pattern Step-through pattern;Decreased arm swing - left;Decreased step length - right;Decreased stride length;Trunk rotated posteriorly on right   Ambulation Surface Level;Indoor   Stairs Yes   Stairs Assistance 4: Min guard   Stairs Assistance Details (indicate cue type and reason) Visual, verbal, demo cues for proper sequencing, foot placement on stairs   Stair Management Technique Two rails;Alternating pattern  w/ BLE prostheses     High Level Balance   High Level Balance Activities Negotitating around obstacles;Side stepping;Other (comment)   High Level Balance Comments Ambulating w/ SPC w/ modified hand orthosis and BLE and LUE prostheses around obstacles while holding plate/cup; sidesteps. PT provided minA and visual/verbal/tactile cues for weight shift, sequencing and AD management. Standing reach to touch objects on floor using [facility] for UE assist. PT provided minGuard for safety and demo/visual/verbal cues for foot placement and sequencing.                PT Education - 07/14/17 1105    Education provided Yes   Education Details Education on carrying empty plates and cups using LUE prosthesis and to practice at home. Instructions to visit [place] website to determine needs for return to shooting.   Person(s) Educated Patient;Child(ren)   Methods Explanation;Demonstration   Comprehension Verbalized understanding;Returned  demonstration;Need further instruction          PT Short Term Goals - 06/28/17 2212      PT SHORT TERM GOAL #1   Title Patient tolerates bil. LE prostheses wear >75% of awake hours without skin issues.    Time 1   Period Months   Status On-going   Target Date 07/21/17     PT SHORT TERM GOAL #2   Title Patient performs sit to/from stand from chairs without armrests without UE support to stablize modified independent.    Time 1   Period Months   Status On-going   Target Date 07/21/17     PT SHORT TERM GOAL #3   Title Patient reaches  10" anteriorly & within 5" of floor without UE support with supervision.    Time 1   Period Months   Status On-going   Target Date 07/21/17     PT SHORT TERM GOAL #4   Title Patient ambulates 300' with single UE support or less with minA.    Time 1   Period Months   Status On-going   Target Date 07/21/17     PT SHORT TERM GOAL #5   Title Patient negotiates ramps & curbs with cane or less with bil. prostheses with minA.    Time 1   Period Months   Status On-going   Target Date 07/21/17           PT Long Term Goals - 06/21/17 2106      PT LONG TERM GOAL #1   Title Patient demonstrates & verbalizes understanding of proper prosthetic care including independent donning to enable safe use of LE prostheses.    Time 2   Period Months   Status Revised   Target Date 08/20/17     PT LONG TERM GOAL #2   Title Patient will tolerate prostheses wear >90% of awake hours without skin issues or limb pain to demonstrate a decrease in his risk of falling.   Time 2   Period Months   Status On-going   Target Date 08/20/17     PT LONG TERM GOAL #3   Title Patient performs standing balance activities with intermittent UE support reaching 10", picking up objects from floor and looks over shoulders with weight shift, trunk rotation with prostheses independently.   Time 4   Period Weeks   Status On-going   Target Date 08/20/17     PT LONG TERM  GOAL #4   Title Patient will ambulate 1000 feet including outdoor surfaces with LRAD and prostheses modified independently to enable community mobility.     Time 4   Period Months   Status On-going   Target Date 08/20/17     PT LONG TERM GOAL #5   Title Patient will negotiate ramp/curbs and stairs with LRAD and prostheses modified independent for community access.    Time 4   Period Months   Status On-going   Target Date 08/20/17     PT LONG TERM GOAL #6   Title Patient's gait velocity will be >/= 1.61ft/s to indicate a limited community ambulator. (Target Date: 08/20/2017)   Time 4   Period Weeks   Status On-going     PT LONG TERM GOAL #7   Title Patient reports Activities of Balance Confindence score using FOTO >25% to indicate greater confidence in his balance. (Target Date: 08/20/2017)   Time 4   Period Months   Status On-going               Plan - 07/14/17 1105    Clinical Impression Statement Today's session focused on ambulating around obstacles and while holding objects in LUE prosthesis, as well as ascending/descending stairs, sit<>stand transfers to/from chair without armrests, and reaching to touch objects on the floor. PT provided cues for proper sequencing and placement of AD and prostheses during activities, as well as minGuard to minAssist to prevent LOB/for safety. Patient is progressing well and will benefit from continued therapy to ensure progression toward goals.   Rehab Potential Good   Clinical Impairments Affecting Rehab Potential arthritis, bicuspid aortic valve, BPH, carpal tunnel syndrome in R wrist, CAD, diverticulitis, GERD, gout, hyperlipidemia, HTN, DM type II,thrombocytopenia, thoracic  ascending aortic aneurysm, severe aortic stenosis, amputation of bilateral hands except right thumb (12/11/2016) due to gangrene, bilateral transtibial amputations (12/11/2016) due to gangrene, R and L heart cath (10/20/2016)    PT Frequency 2x / week   PT Duration  Other (comment)  60 days (9 weeks)   PT Treatment/Interventions ADLs/Self Care Home Management;Neuromuscular re-education;Balance training;Therapeutic exercise;Therapeutic activities;Functional mobility training;Stair training;Gait training;DME Instruction;Patient/family education;Prosthetic Training   PT Next Visit Plan Begin to assess STGs. Progress ambulation with B LE prostheses, SPC and LUE prosthesis over obstacles. Stairs. Sitting in chair and pulling up to table.    Consulted and Agree with Plan of Care Patient;Family member/caregiver   Family Member Consulted daughter       Patient will benefit from skilled therapeutic intervention in order to improve the following deficits and impairments:  Abnormal gait, Decreased activity tolerance, Decreased balance, Decreased coordination, Decreased range of motion, Decreased mobility, Decreased knowledge of use of DME, Decreased knowledge of precautions, Decreased endurance, Decreased skin integrity, Decreased scar mobility, Decreased strength, Difficulty walking, Postural dysfunction, Prosthetic Dependency  Visit Diagnosis: Other abnormalities of gait and mobility  Unsteadiness on feet     Problem List Patient Active Problem List   Diagnosis Date Noted  . Amputation of both hands with complication, subsequent encounter 03/01/2017  . Diabetes ([place]) 02/19/2017  . Status post bilateral below knee amputation ([place]) 02/09/2017  . Wound disruption, post-op, skin, sequela 02/09/2017  . Debility 02/05/2017  . Ganglion upper arm, right   . Thrombosis of both upper extremities   . Suspected heparin induced thrombocytopenia (HIT) in hospitalized patient ([place])   . Gangrene of lower extremity ([place])   . Heparin induced thrombocytopenia (HIT) ([place])   . Tracheostomy status ([place])   . Chest tube in place   . Atherosclerosis of native arteries of extremities with gangrene, left leg ([place])   . Atherosclerosis of native arteries of extremities with  gangrene, right leg ([place])   . Acute on chronic diastolic CHF (congestive heart failure) ([place])   . Enteritis due to Clostridium difficile   . Anasarca   . FUO (fever of unknown origin)   . Acute encephalopathy   . Elevated LFTs   . DIC (disseminated intravascular coagulation) ([place]) 10/28/2016  . Postoperative atrial fibrillation ([place]) 10/24/2016  . Cardiogenic shock ([place])   . Mitral regurgitation due to cusp prolapse 10/22/2016  . S/P aortic valve replacement with bioprosthetic valve 10/22/2016  . S/P ascending aortic aneurysm repair 10/22/2016  . S/P mitral valve repair 10/22/2016  . S/P CABG x 3 10/22/2016  . Thrombocytopenia ([place]) 10/22/2016  . Acute combined systolic and diastolic congestive heart failure ([place]) 10/22/2016  . Acute respiratory failure ([place]) 10/22/2016  . Thoracic ascending aortic aneurysm ([place]) 10/21/2016  . Coronary artery disease involving native coronary artery of native heart with unstable angina pectoris ([place]) 10/20/2016  . Severe aortic stenosis by prior echocardiography 10/19/2016  . Unstable angina ([place]) 10/19/2016  . Aortic valve stenosis 10/19/2016  . Bicuspid aortic valve 10/19/2016  . Trigger ring finger of right hand   . Wears glasses   . Gout   . Hypertension   . Carpal tunnel syndrome of right wrist   . Arthritis   . Snores     Gershon Crane, [place] 07/14/2017, 1:05 PM  [facility] [place] [address] [place], [place], [address] Phone: [phone]   Fax:  [phone]  Name: Scott [name] MRN: [id number] Date of Birth: 03/08/1946

## 2017-07-15 ENCOUNTER — Encounter: Payer: Self-pay | Admitting: Cardiology

## 2017-07-15 ENCOUNTER — Ambulatory Visit (INDEPENDENT_AMBULATORY_CARE_PROVIDER_SITE_OTHER): Payer: Medicare Other | Admitting: Cardiology

## 2017-07-15 VITALS — BP 98/62 | HR 81 | Ht 69.0 in | Wt 196.2 lb

## 2017-07-15 DIAGNOSIS — Z9889 Other specified postprocedural states: Secondary | ICD-10-CM | POA: Diagnosis not present

## 2017-07-15 DIAGNOSIS — I10 Essential (primary) hypertension: Secondary | ICD-10-CM | POA: Diagnosis not present

## 2017-07-15 DIAGNOSIS — Z952 Presence of prosthetic heart valve: Secondary | ICD-10-CM

## 2017-07-15 DIAGNOSIS — I4891 Unspecified atrial fibrillation: Secondary | ICD-10-CM | POA: Diagnosis not present

## 2017-07-15 DIAGNOSIS — I2510 Atherosclerotic heart disease of native coronary artery without angina pectoris: Secondary | ICD-10-CM

## 2017-07-15 MED ORDER — AMOXICILLIN 500 MG PO CAPS: 2000.0000 mg | ORAL_CAPSULE | Freq: Every day | ORAL | 0 refills | Status: DC

## 2017-07-15 MED ORDER — FUROSEMIDE 40 MG PO TABS: 40.0000 mg | ORAL_TABLET | Freq: Every day | ORAL | 3 refills | Status: DC

## 2017-07-15 NOTE — Progress Notes (Signed)
Clinical Summary Mr. Bogart is a 71 y.o.male seen today for follow up of the following medical problems.   1. Aortic stenosis - history of bicuspid AV - history of prior AVR, Edwards Magna Ease Pericardial Tissue Valve (size 34m, model # 3300TFX, serial # [id number] along with 3 vessel CABG, MV repair, and repair of ascending aortic aneurysm    - OR on 10/22/16 for CABG X 3, AVR, MVR, repair of thoracic ascending aneurysm with placement of VAC on open chest Post op course significant for cardiogenic shock requiring multiple pressors, SAM requiring removal of mitral annuloplasty ring, persistent fevers, encephalopathy, shocked liver, DIC due to consumptive intraoperative coagulopathy, diffuse clotting with ischemia of distal BUE/BLE extremities, A fib, C diff colitis, volume overload and VDRF with difficulty with vent wean requiring tracheostomy. Sternal wound closed on 12/4 and was started on coumadin for RV thrombus and A fib.   [facility], pneumonitis, intermittent fevers, dysphagia requiring PEG tube placement on 1/15 by Dr. YKathlene Cote He developed progressive gangrenous changes of BUE and BLE requiring B-transtibial amputation , left transmetacarpal amputation and Right MCP amputation of right hand by Dr. DSharol Givenon 1/19.     - denies any SOB or DOE, working with rehab twice a week without troubles - no orthopnea, no PND, no chest pain - compliant with meds    2. Mitral regurgitation - patient had MV ring placed during his surgery 10/22/17 along with AVR, ascending thoracic aneurysm repair, and CABG.  - ring was subsequently removed during that operation due to significant SAM - 02/15/17 echo shows only mild MR  - denies any SOB/DOE/LE edema since our last visit  3. CAD - history of CABG 10/22/2017 (LIMA-Diag and distal LAD, SVG-LAD) 01/2017 echo: 55-60%, no WMAs, grade II diastolic dysfunction. Normal functioning AV.    - no  recent chest pains/SOB/DOE  4. Ischemic limbs - occurred in setting of cardiogenic shock requiring high dose pressors as well as DIC - required amputation - followed by ortho. Appt with Dr DSharol Given   5.Afib/aflutter - initally had post op afib managed with amiodarone - noted to be in aflutter at clinical f/u. We increased his amio at that time   - no recent palpitations.    6. [place]thrombus - [place] - repeat echo did not show evidence of RA thrombus - has been on coumadin    Past Medical History:  Diagnosis Date  . Arthritis    "hips, shoulders; knees; back" (10/20/2016)  . Bicuspid aortic valve   . BPH (benign prostatic hypertrophy)   . Carpal tunnel syndrome of right wrist   . Coronary artery disease involving native coronary artery of native heart with unstable angina pectoris ([place] 10/20/[place]  . Diverticulitis   . GERD (gastroesophageal reflux disease)   . Gout   . Heart murmur   . Hyperlipemia   . Hypertension   . Postoperative atrial fibrillation ([place] 10/24/2016  . RLS (restless legs syndrome)   . S/P aortic valve replacement with bioprosthetic valve 10/22/2016   [facility] bovine pericardial bioprosthetic tissue valve  . S/P ascending aortic aneurysm repair 10/22/2016   28 mm supracoronary straight graft replacement of ascending thoracic aortic aneurysm  . S/P CABG x 3 10/22/2016   Sequential LIMA to Diag and LAD, SVG to distal LAD, open vein harvest right thigh  . S/P mitral valve repair 10/22/2016   Artificial Gore-tex neochord placement x6 - posterior annuloplasty  band placed but removed due to systolic anterior motion of mitral valve  . Severe aortic stenosis   . Snores    Never been tested for sleep apnea  . Thoracic ascending aortic aneurysm ([place]) 10/21/2016  . Thrombocytopenia ([place]) 10/22/2016  . Type II diabetes mellitus (H[facility])      Allergies  Allergen Reactions  . Doxycycline Hives  . Sulfa Antibiotics  Hives     Current Outpatient Prescriptions  Medication Sig Dispense Refill  . allopurinol (ZYLOPRIM) 300 MG tablet Take 300 mg by mouth daily.    Marland Kitchen amiodarone (PACERONE) 200 MG tablet Take 1 tablet (200 mg total) by mouth daily. 90 tablet 3  . aspirin EC 81 MG tablet Take 81 mg by mouth daily.    Marland Kitchen atenolol (TENORMIN) 25 MG tablet TAKE 1 TABLET (25 MG TOTAL) BY MOUTH DAILY. 30 tablet 3  . atorvastatin (LIPITOR) 40 MG tablet TAKE 1 TABLET (40 MG TOTAL) BY MOUTH DAILY AT 6 PM. 30 tablet 3  . blood glucose meter kit and supplies KIT Dispense based on patient and insurance preference. Use up to four times daily as directed. (FOR ICD-9 250.00, 250.01). 1 each 0  . cephALEXin (KEFLEX) 500 MG capsule Take 1 capsule (500 mg total) by mouth 4 (four) times daily. 56 capsule 0  . digoxin (LANOXIN) 0.125 MG tablet TAKE 1 TABLET (0.125 MG TOTAL) BY MOUTH DAILY. 30 tablet 2  . famotidine (PEPCID) 20 MG tablet Take 1 tablet (20 mg total) by mouth 2 (two) times daily. (Patient taking differently: Take 20 mg by mouth daily. ) 60 tablet 0  . ferrous sulfate 325 (65 FE) MG tablet Take 1 tablet (325 mg total) by mouth 2 (two) times daily with a meal. 60 tablet 0  . fluticasone (FLONASE) 50 MCG/ACT nasal spray Place 1 spray into both nostrils daily. (Patient taking differently: Place 1 spray into both nostrils daily as needed. ) 9.9 g 2  . furosemide (LASIX) 40 MG tablet TAKE 1 TABLET (40 MG TOTAL) BY MOUTH 2 (TWO) TIMES DAILY. 60 tablet 3  . gabapentin (NEURONTIN) 300 MG capsule Take 1 capsule (300 mg total) by mouth 3 (three) times daily. 270 capsule 1  . KLOR-CON M20 20 MEQ tablet TAKE 1 TABLET (20 MEQ TOTAL) BY MOUTH 2 (TWO) TIMES DAILY. 60 tablet 3  . lisinopril (PRINIVIL,ZESTRIL) 2.5 MG tablet TAKE 1 TABLET (2.5 MG TOTAL) BY MOUTH DAILY. 30 tablet 3  . Melatonin 10 MG CAPS Take 10 mg by mouth at bedtime.     . metFORMIN (GLUCOPHAGE) 500 MG tablet Take 500 mg by mouth daily with breakfast.    . nitroGLYCERIN  (NITROSTAT) 0.4 MG SL tablet Place 0.4 mg under the tongue every 5 (five) minutes as needed.     Marland Kitchen rOPINIRole (REQUIP) 2 MG tablet Take 2 mg by mouth See admin instructions. Takes 82m at 4:30pm and 283mat 9:30pm.    . sertraline (ZOLOFT) 50 MG tablet Take 1.5 tablets (75 mg total) by mouth daily. (Patient taking differently: Take 50 mg by mouth daily. ) 45 tablet 0  . vitamin C (VITAMIN C) 500 MG tablet Take 1 tablet (500 mg total) by mouth 2 (two) times daily. 60 tablet 0  . warfarin (COUMADIN) 2 MG tablet Take 1 1/2 tablets daily except 1 tablet on Sundays 45 tablet 3  . zinc sulfate 220 (50 Zn) MG capsule Take 1 capsule (220 mg total) by mouth 2 (two) times daily. 60 capsule 0   No  current facility-administered medications for this visit.      Past Surgical History:  Procedure Laterality Date  . AMPUTATION Bilateral 12/11/2016   Procedure: AMPUTATION BELOW KNEE BILATERALLY;  Surgeon: [name], MD;  Location: [place];  Service: Orthopedics;  Laterality: Bilateral;  . AMPUTATION Bilateral 12/11/2016   Procedure: AMPUTATION BILATERAL HANDS EXCEPT RIGHT THUMB;  Surgeon: [name], MD;  Location: [place];  Service: Orthopedics;  Laterality: Bilateral;  . AORTIC VALVE REPLACEMENT N/A 10/22/2016   Procedure: AORTIC VALVE REPLACEMENT (AVR) WITH SIZE 25 MM MAGNA EASE PERICARDIAL BIOPROSTHESIS - AORTIC;  Surgeon: Rexene Alberts, MD;  Location: [place];  Service: Open Heart Surgery;  Laterality: N/A;  . BONE EXCISION Right 02/01/2017   Procedure: EXCISION RIGHT INDEX METACARPAL HEAD;  Surgeon: [name], MD;  Location: [place];  Service: Orthopedics;  Laterality: Right;  . CARDIAC CATHETERIZATION N/A 10/20/2016   Procedure: Right/Left Heart Cath and Coronary Angiography;  Surgeon: Leonie Man, MD;  Location: [place] [facility];  Service: Cardiovascular;  Laterality: N/A;  . CARPAL TUNNEL RELEASE Right 11/28/2013   Procedure: RIGHT WRIST CARPAL TUNNEL RELEASE;  Surgeon: Lorn Junes, MD;   Location: [place];  Service: Orthopedics;  Laterality: Right;  . CATARACT EXTRACTION W/ INTRAOCULAR LENS  IMPLANT, BILATERAL Bilateral 1978  . COLONOSCOPY    . CORONARY ARTERY BYPASS GRAFT N/A 10/22/2016   Procedure: CORONARY ARTERY BYPASS GRAFTING (CABG)x 2 WITH LIMA TO DIAGONAL, OPEN  HARVESTING OF RIGHT SAPHENOUS VEIN FOR VEIN GRAFT TO LAD;  Surgeon: Rexene Alberts, MD;  Location: [place];  Service: Open Heart Surgery;  Laterality: N/A;  . FRACTURE SURGERY    . INGUINAL HERNIA REPAIR Right 1998  . IR GENERIC HISTORICAL  12/07/2016   IR US GUIDE VASC ACCESS RIGHT 12/07/2016 Aletta Edouard, MD [facility]  . IR GENERIC HISTORICAL  12/07/2016   IR RADIOLOGY PERIPHERAL GUIDED IV START 12/07/2016 Aletta Edouard, MD [facility]  . IR GENERIC HISTORICAL  12/07/2016   IR GASTROSTOMY TUBE MOD SED 12/07/2016 Aletta Edouard, MD [facility]  . LIPOMA EXCISION Right 2008   "side of my head"  . MITRAL VALVE REPAIR N/A 10/22/2016   Procedure: MITRAL VALVE REPAIR (MVR) WITH SIZE 30 SORIN ANNULOFLEX ANNULOPLASTY RING WITH SUBSEQUENT REMOVAL OF RING;  Surgeon: Rexene Alberts, MD;  Location: [place];  Service: Open Heart Surgery;  Laterality: N/A;  . PENECTOMY  2007   Peyronie's disease   . PENILE PROSTHESIS IMPLANT  2009  . REMOVAL OF PENILE PROSTHESIS N/A 02/01/2017   Procedure: REMOVAL OF PENILE PROSTHESIS;  Surgeon: Kathie Rhodes, MD;  Location: [place];  Service: Urology;  Laterality: N/A;  . SHOULDER OPEN ROTATOR CUFF REPAIR Right 2006  . STERNAL CLOSURE N/A 10/26/2016   Procedure: STERNAL WASHOUT AND DELAYED PRIMARY CLOSURE;  Surgeon: Rexene Alberts, MD;  Location: [place];  Service: Thoracic;  Laterality: N/A;  . TEE WITHOUT CARDIOVERSION N/A 10/26/2016   Procedure: TRANSESOPHAGEAL ECHOCARDIOGRAM (TEE);  Surgeon: Rexene Alberts, MD;  Location: [place];  Service: Thoracic;  Laterality: N/A;  . TEE WITHOUT CARDIOVERSION N/A 10/22/2016   Procedure: TRANSESOPHAGEAL ECHOCARDIOGRAM (TEE);   Surgeon: Rexene Alberts, MD;  Location: [place];  Service: Open Heart Surgery;  Laterality: N/A;  . THORACIC AORTIC ANEURYSM REPAIR  10/22/2016   Procedure: ASCENDING AORTIC  ANEURYSM REPAIR (AAA) WITH 28 MM HEMASHIELD PLATINUM WOVEN DOUBLE VELOUR VASCULAR GRAFT;  Surgeon: Rexene Alberts, MD;  Location: [place];  Service: Open Heart Surgery;;  . TONSILLECTOMY  ~ 1955  . TRACHEOSTOMY TUBE PLACEMENT N/A 11/09/2016   Procedure: TRACHEOSTOMY;  Surgeon: Rexene Alberts, MD;  Location: [place];  Service: Thoracic;  Laterality: N/A;  . TRANSURETHRAL RESECTION OF PROSTATE  2005  . TRIGGER FINGER RELEASE Bilateral    several lt and rt hands  . TRIGGER FINGER RELEASE Right 11/28/2013   Procedure: RIGHT TRIGGER FINGER  RELEASE (TENDON SHEATH INCISION);  Surgeon: Lorn Junes, MD;  Location: [place];  Service: Orthopedics;  Laterality: Right;  Marland Kitchen VIDEO BRONCHOSCOPY N/A 11/09/2016   Procedure: VIDEO BRONCHOSCOPY;  Surgeon: Rexene Alberts, MD;  Location: [place];  Service: Thoracic;  Laterality: N/A;  . WRIST FRACTURE SURGERY Right ~ 8502     Allergies  Allergen Reactions  . Doxycycline Hives  . Sulfa Antibiotics Hives      Family History  Problem Relation Age of Onset  . Lung cancer Mother   . Clotting disorder Father        No details  . Heart disease Sister 73       Stents  . Cancer Sister        Throat     Social History Mr. Buehrle reports that he quit smoking about 34 years ago. His smoking use included Pipe and Cigars. He quit after 3.00 years of use. He has never used smokeless tobacco. Mr. Nazar reports that he drinks about 6.0 oz of alcohol per week .   Review of Systems CONSTITUTIONAL: No weight loss, fever, chills, weakness or fatigue.  HEENT: Eyes: No visual loss, blurred vision, double vision or yellow sclerae.No hearing loss, sneezing, congestion, runny nose or sore throat.  SKIN: No rash or itching.  CARDIOVASCULAR: per hpi RESPIRATORY: per  hpi GASTROINTESTINAL: No anorexia, nausea, vomiting or diarrhea. No abdominal pain or blood.  GENITOURINARY: No burning on urination, no polyuria NEUROLOGICAL: No headache, dizziness, syncope, paralysis, ataxia, numbness or tingling in the extremities. No change in bowel or bladder control.  MUSCULOSKELETAL: No muscle, back pain, joint pain or stiffness.  LYMPHATICS: No enlarged nodes. No history of splenectomy.  PSYCHIATRIC: No history of depression or anxiety.  ENDOCRINOLOGIC: No reports of sweating, cold or heat intolerance. No polyuria or polydipsia.  Marland Kitchen   Physical Examination Vitals:   07/15/17 1420  BP: 98/62  Pulse: 81  SpO2: 98%   Vitals:   07/15/17 1420  Weight: 196 lb 3.2 oz (89 kg)  Height: '5\' 9"'  (1.753 m)    Gen: resting comfortably, no acute distress HEENT: no scleral icterus, pupils equal round and reactive, no palptable cervical adenopathy,  CV: irreg, 2/6 systolic murmur rusb, no jvd Resp: Clear to auscultation bilaterally GI: abdomen is soft, non-tender, non-distended, normal bowel sounds, no hepatosplenomegaly MSK: extremities are warm, no edema.  Skin: warm, no rash Neuro:  no focal deficits Psych: appropriate affect      Assessment and Plan   1. Aortic stenosis - s/p tissue AVR, recent echo shows normal function.  - no  recent symptoms - amoxicillin 2g 1 dose 30 minutes prior to dental procedure, Rx given today.  - some soft bp's and orthostatic symptosm at times, change lasix to 72m daily, may take additional 49mas needed.   2. Mitral regurgitation - MV ring placed and [facility] during [facility] recent ehco shows mild MR. Likely improved due to decreased pressure across AV valve  - no recent symptoms, we will continue to monitor  3. CAD - s/p CABG. No recent chest pani - continue current meds  4. Afib - initially postop afib after cardiac surgery. Was on amio on discharge. However, has converted back to afib at  outpatient f/u despite increase in amio last visit. EKG in clinic today shows rate controlled afib.  - he is asymptomatic - we will d/c amio since he is not mainting SR. Continue rate control. He is asymptomatic with appropriate rate control and thus at this time do not see a strong indication for cardioversion, though may consider at continued f/u.    5. RA thrombus - occurred post cardiac surgery. Has been on coumadin -  Most recent echo without evidence of ongoing clot   6. Ischemic limbs - s/p multiple amputations, occurred postop in setting of DIC and pressor use - continue to follow with ortho    F/u 6 months     Arnoldo Lenis, M.D

## 2017-07-15 NOTE — Patient Instructions (Signed)
Medication Instructions:  Your physician has recommended you make the following change in your medication:  Stop amiodarone Start Lasix 40 mg daily plus 40 mg as needed for shortness of breath Start amoxicillin 2,000 mg 40 min prior to dental procedure  Labwork: none  Testing/Procedures: none  Follow-Up: Your physician wants you to follow-up in: [place] DR. BRANCH You will receive a reminder letter in the mail two months in advance. If you don't receive a letter, please call our office to schedule the follow-up appointment.  Any Other Special Instructions Will Be Listed Below (If Applicable).  If you need a refill on your cardiac medications before your next appointment, please call your pharmacy.

## 2017-07-19 ENCOUNTER — Encounter: Payer: Self-pay | Admitting: Physical Therapy

## 2017-07-19 ENCOUNTER — Ambulatory Visit: Payer: Medicare Other | Admitting: Physical Therapy

## 2017-07-19 DIAGNOSIS — R2689 Other abnormalities of gait and mobility: Secondary | ICD-10-CM | POA: Diagnosis not present

## 2017-07-19 DIAGNOSIS — R2681 Unsteadiness on feet: Secondary | ICD-10-CM | POA: Diagnosis not present

## 2017-07-19 DIAGNOSIS — R29898 Other symptoms and signs involving the musculoskeletal system: Secondary | ICD-10-CM | POA: Diagnosis not present

## 2017-07-19 DIAGNOSIS — R293 Abnormal posture: Secondary | ICD-10-CM | POA: Diagnosis not present

## 2017-07-19 DIAGNOSIS — M79641 Pain in right hand: Secondary | ICD-10-CM | POA: Diagnosis not present

## 2017-07-19 DIAGNOSIS — M6281 Muscle weakness (generalized): Secondary | ICD-10-CM

## 2017-07-19 NOTE — Telephone Encounter (Signed)
Opened in error

## 2017-07-19 NOTE — Therapy (Signed)
[facility] [address] [place], [place], [address] Phone: [phone]   Fax:  [phone]  Physical Therapy Treatment  Patient Details  Name: Scott [name] MRN: [id number] Date of Birth: [birth year] Referring Provider: Meridee Score MD   Encounter Date: 07/19/2017      PT End of Session - 07/19/17 1105    Visit Number 24   Number of Visits 33   Date for PT Re-Evaluation 08/20/17   Authorization Type Medicare & G-codes every 10th visit    PT Start Time 1100   PT Stop Time 1145   PT Time Calculation (min) 45 min   Equipment Utilized During Treatment Gait belt  SPC   Activity Tolerance Patient tolerated treatment well   Behavior During Therapy [facility] for tasks assessed/performed      Past Medical History:  Diagnosis Date  . Arthritis    "hips, shoulders; knees; back" (10/20/2016)  . Bicuspid aortic valve   . BPH (benign prostatic hypertrophy)   . Carpal tunnel syndrome of right wrist   . Coronary artery disease involving native coronary artery of native heart with unstable angina pectoris ([place]) 10/20/2016  . Diverticulitis   . GERD (gastroesophageal reflux disease)   . Gout   . Heart murmur   . Hyperlipemia   . Hypertension   . Postoperative atrial fibrillation ([place]) 10/24/2016  . RLS (restless legs syndrome)   . S/P aortic valve replacement with bioprosthetic valve 10/22/2016   25 mm [facility] Ease bovine pericardial bioprosthetic tissue valve  . S/P ascending aortic aneurysm repair 10/22/2016   28 mm supracoronary straight graft replacement of ascending thoracic aortic aneurysm  . S/P CABG x 3 10/22/2016   Sequential LIMA to Diag and LAD, SVG to distal LAD, open vein harvest right thigh  . S/P mitral valve repair 10/22/2016   Artificial Gore-tex neochord placement x6 - posterior annuloplasty band placed but removed due to systolic anterior motion of mitral valve  . Severe aortic stenosis   . Snores     Never been tested for sleep apnea  . Thoracic ascending aortic aneurysm ([place]) 10/21/2016  . Thrombocytopenia ([place]) 10/22/2016  . Type II diabetes mellitus ([place])     Past Surgical History:  Procedure Laterality Date  . AMPUTATION Bilateral 12/11/2016   Procedure: AMPUTATION BELOW KNEE BILATERALLY;  Surgeon: [name], MD;  Location: [place];  Service: Orthopedics;  Laterality: Bilateral;  . AMPUTATION Bilateral 12/11/2016   Procedure: AMPUTATION BILATERAL HANDS EXCEPT RIGHT THUMB;  Surgeon: [name], MD;  Location: [place];  Service: Orthopedics;  Laterality: Bilateral;  . AORTIC VALVE REPLACEMENT N/A 10/22/2016   Procedure: AORTIC VALVE REPLACEMENT (AVR) WITH SIZE 25 MM MAGNA EASE PERICARDIAL BIOPROSTHESIS - AORTIC;  Surgeon: Rexene Alberts, MD;  Location: [place];  Service: Open Heart Surgery;  Laterality: N/A;  . BONE EXCISION Right 02/01/2017   Procedure: EXCISION RIGHT INDEX METACARPAL HEAD;  Surgeon: [name], MD;  Location: [place];  Service: Orthopedics;  Laterality: Right;  . CARDIAC CATHETERIZATION N/A 10/20/2016   Procedure: Right/Left Heart Cath and Coronary Angiography;  Surgeon: Leonie Man, MD;  Location: [facility] [facility];  Service: Cardiovascular;  Laterality: N/A;  . CARPAL TUNNEL RELEASE Right 11/28/2013   Procedure: RIGHT WRIST CARPAL TUNNEL RELEASE;  Surgeon: Lorn Junes, MD;  Location: [place];  Service: Orthopedics;  Laterality: Right;  . CATARACT EXTRACTION W/ INTRAOCULAR LENS  IMPLANT, BILATERAL Bilateral 1978  . COLONOSCOPY    .  CORONARY ARTERY BYPASS GRAFT N/A 10/22/2016   Procedure: CORONARY ARTERY BYPASS GRAFTING (CABG)x 2 WITH LIMA TO DIAGONAL, OPEN  HARVESTING OF RIGHT SAPHENOUS VEIN FOR VEIN GRAFT TO LAD;  Surgeon: Rexene Alberts, MD;  Location: [facility];  Service: Open Heart Surgery;  Laterality: N/A;  . FRACTURE SURGERY    . INGUINAL HERNIA REPAIR Right 1998  . IR GENERIC HISTORICAL  12/07/2016   IR US GUIDE VASC ACCESS RIGHT  12/07/2016 Aletta Edouard, MD [facility]  . IR GENERIC HISTORICAL  12/07/2016   IR RADIOLOGY PERIPHERAL GUIDED IV START 12/07/2016 Aletta Edouard, MD [facility]  . IR GENERIC HISTORICAL  12/07/2016   IR GASTROSTOMY TUBE MOD SED 12/07/2016 Aletta Edouard, MD [facility]  . LIPOMA EXCISION Right 2008   "side of my head"  . MITRAL VALVE REPAIR N/A 10/22/2016   Procedure: MITRAL VALVE REPAIR (MVR) WITH SIZE 30 SORIN ANNULOFLEX ANNULOPLASTY RING WITH SUBSEQUENT REMOVAL OF RING;  Surgeon: Rexene Alberts, MD;  Location: [place];  Service: Open Heart Surgery;  Laterality: N/A;  . PENECTOMY  2007   Peyronie's disease   . PENILE PROSTHESIS IMPLANT  2009  . REMOVAL OF PENILE PROSTHESIS N/A 02/01/2017   Procedure: REMOVAL OF PENILE PROSTHESIS;  Surgeon: Kathie Rhodes, MD;  Location: [place];  Service: Urology;  Laterality: N/A;  . SHOULDER OPEN ROTATOR CUFF REPAIR Right 2006  . STERNAL CLOSURE N/A 10/26/2016   Procedure: STERNAL WASHOUT AND DELAYED PRIMARY CLOSURE;  Surgeon: Rexene Alberts, MD;  Location: [place];  Service: Thoracic;  Laterality: N/A;  . TEE WITHOUT CARDIOVERSION N/A 10/26/2016   Procedure: TRANSESOPHAGEAL ECHOCARDIOGRAM (TEE);  Surgeon: Rexene Alberts, MD;  Location: [place];  Service: Thoracic;  Laterality: N/A;  . TEE WITHOUT CARDIOVERSION N/A 10/22/2016   Procedure: TRANSESOPHAGEAL ECHOCARDIOGRAM (TEE);  Surgeon: Rexene Alberts, MD;  Location: [facility];  Service: Open Heart Surgery;  Laterality: N/A;  . THORACIC AORTIC ANEURYSM REPAIR  10/22/2016   Procedure: ASCENDING AORTIC  ANEURYSM REPAIR (AAA) WITH 28 MM HEMASHIELD PLATINUM WOVEN DOUBLE VELOUR VASCULAR GRAFT;  Surgeon: Rexene Alberts, MD;  Location: [facility];  Service: Open Heart Surgery;;  . TONSILLECTOMY  ~ 1955  . TRACHEOSTOMY TUBE PLACEMENT N/A 11/09/2016   Procedure: TRACHEOSTOMY;  Surgeon: Rexene Alberts, MD;  Location: [facility];  Service: Thoracic;  Laterality: N/A;  . TRANSURETHRAL RESECTION OF PROSTATE  2005  . TRIGGER FINGER  RELEASE Bilateral    several lt and rt hands  . TRIGGER FINGER RELEASE Right 11/28/2013   Procedure: RIGHT TRIGGER FINGER  RELEASE (TENDON SHEATH INCISION);  Surgeon: Lorn Junes, MD;  Location: [place];  Service: Orthopedics;  Laterality: Right;  Marland Kitchen VIDEO BRONCHOSCOPY N/A 11/09/2016   Procedure: VIDEO BRONCHOSCOPY;  Surgeon: Rexene Alberts, MD;  Location: [facility];  Service: Thoracic;  Laterality: N/A;  . WRIST FRACTURE SURGERY Right ~ 1959    There were no vitals filed for this visit.      Subjective Assessment - 07/19/17 1103    Subjective Reports no problems, falls or pain.    Pertinent History arthritis, bicuspid aortic valve, BPH, carpal tunnel syndrome in R wrist, CAD, diverticulitis, GERD, gout, hyperlipidemia, HTN, DM type II,thrombocytopenia, thoracic ascending aortic aneurysm, severe aortic stenosis, amputation of bilateral hands except right thumb (12/11/2016) due to gangrene, bilateral transtibial amputations (12/11/2016) due to gangrene, R and L heart cath (10/20/2016)    Limitations Standing;Walking;House hold activities   Patient Stated Goals use prostheses to walk  including stairs, outdoors, target shooting, drive   Currently in Pain? No/denies   Pain Onset 1 to 4 weeks ago   Pain Onset Today                         [facility] Adult PT Treatment/Exercise - 07/19/17 1105      Transfers   Transfers Sit to Stand;Stand to Sit   Sit to Stand 5: Supervision;With upper extremity assist;From chair/3-in-1;With armrests;4: Min assist   Sit to Stand Details Verbal cues for sequencing;Verbal cues for technique;Manual facilitation for weight shifting  when using chair without arm rests   Sit to Stand Details (indicate cue type and reason) Patient sit to stand from chair with armrests with UE support and PT supervision. Patient sit to stand from chair without armrests with UE support and minA of PT with verbal, and manual cues.   Stand to Sit 5:  Supervision;With upper extremity assist;To chair/3-in-1;With armrests;4: Min assist   Stand to Sit Details (indicate cue type and reason) Verbal cues for sequencing;Verbal cues for technique;Manual facilitation for weight shifting  when using chair without arm rests   Stand to Sit Details Patient stand to sit to chair with armrests with UE support and PT supervision. Patient stand to sit to chair without armrests with UE support and minA of PT with verbal and manual cues.     Ambulation/Gait   Ambulation/Gait Yes   Ambulation/Gait Assistance 4: Min guard   Ambulation/Gait Assistance Details tactile cues for pelvic rotation   Ambulation Distance (Feet) 200 Feet   Assistive device Straight cane;Prostheses  SPC w/ modified hand orthotic; B LE and L UE prostheses   Gait Pattern Step-through pattern;Decreased arm swing - left;Decreased step length - right;Decreased stride length;Trunk rotated posteriorly on right   Ambulation Surface Level;Unlevel;Indoor   Stairs Yes   Stairs Assistance 4: Min guard;4: Min assist  SPC w/ modified hand orthotic; B LE and L UE prostheses   Stairs Assistance Details (indicate cue type and reason) Pt ascended/descended stairs with step-to pattern using SPC and 1 rail with minGuard of PT. Pt ascended/descended stairs with alternating pattern and 2 rails with minAssist of PT. PT provided visual, verbal cues for sequencing, foot placement on steps.  SPC w/ modified hand orthotic; B LE and L UE prostheses   Stair Management Technique One rail Right;One rail Left;With cane;Step to pattern;Two rails;Alternating pattern  SPC w/ modified hand orthotic; B LE and L UE prostheses   Ramp 4: Min assist  SPC w/ modified hand orthotic; B LE and L UE prostheses   Ramp Details (indicate cue type and reason) PT provided visual and verbal cues for foot placement and AD management with minA to prevent LOB/for safety   Curb 3: Mod assist  SPC w/ modified hand orthotic; B LE and L UE  prostheses   Curb Details (indicate cue type and reason) 3" aerobic step to adjust height of curb.     Therapeutic Activites    Therapeutic Activities Other Therapeutic Activities   Other Therapeutic Activities Pulling chair out at table, sitting down, sliding chair back under while seated. Reverse. Performed using UE support to/from chair with and without armrests. PT provided MinGuard for safety and visual, verbal, tactile and demo cues for sequencing.     Prosthetics   Prosthetic Care Comments  PT instructed to continue wearing Tegaderm on R LE as previously instructed   Current prosthetic wear tolerance (days/week)  daily   Residual  limb condition  RLE wound on tibial tuberosity shows mininal slough/redness at perimeter   Education Provided Residual limb care;Proper wear schedule/adjustment   Person(s) Educated Patient;Spouse   Education Method Explanation;Verbal cues   Education Method Verbalized understanding;Needs further instruction   Donning Prosthesis Modified independent (device/increased time)   Doffing Prosthesis Modified independent (device/increased time)                PT Education - 07/19/17 1106    Education Details Education to apply baby oil or diaper rash cream on the R LE bypass graft scar to reduce friction from prosthesis. Instruction to continue wearing tegaderm on R knee as previously instructed until next visit. Education on assessing fluid retention via sock ply need. [employer] handout on shooting sports.   Person(s) Educated Patient;Spouse   Methods Explanation;Demonstration;Tactile cues   Comprehension Verbalized understanding;Need further instruction          PT Short Term Goals - 06/28/17 2212      PT SHORT TERM GOAL #1   Title Patient tolerates bil. LE prostheses wear >75% of awake hours without skin issues.    Time 1   Period Months   Status On-going   Target Date 07/21/17     PT SHORT TERM GOAL #2   Title Patient performs sit  to/from stand from chairs without armrests without UE support to stablize modified independent.    Time 1   Period Months   Status On-going   Target Date 07/21/17     PT SHORT TERM GOAL #3   Title Patient reaches 10" anteriorly & within 5" of floor without UE support with supervision.    Time 1   Period Months   Status On-going   Target Date 07/21/17     PT SHORT TERM GOAL #4   Title Patient ambulates 300' with single UE support or less with minA.    Time 1   Period Months   Status On-going   Target Date 07/21/17     PT SHORT TERM GOAL #5   Title Patient negotiates ramps & curbs with cane or less with bil. prostheses with minA.    Time 1   Period Months   Status On-going   Target Date 07/21/17           PT Long Term Goals - 06/21/17 2106      PT LONG TERM GOAL #1   Title Patient demonstrates & verbalizes understanding of proper prosthetic care including independent donning to enable safe use of LE prostheses.    Time 2   Period Months   Status Revised   Target Date 08/20/17     PT LONG TERM GOAL #2   Title Patient will tolerate prostheses wear >90% of awake hours without skin issues or limb pain to demonstrate a decrease in his risk of falling.   Time 2   Period Months   Status On-going   Target Date 08/20/17     PT LONG TERM GOAL #3   Title Patient performs standing balance activities with intermittent UE support reaching 10", picking up objects from floor and looks over shoulders with weight shift, trunk rotation with prostheses independently.   Time 4   Period Weeks   Status On-going   Target Date 08/20/17     PT LONG TERM GOAL #4   Title Patient will ambulate 1000 feet including outdoor surfaces with LRAD and prostheses modified independently to enable community mobility.     Time 4  Period Months   Status On-going   Target Date 08/20/17     PT LONG TERM GOAL #5   Title Patient will negotiate ramp/curbs and stairs with LRAD and prostheses modified  independent for community access.    Time 4   Period Months   Status On-going   Target Date 08/20/17     PT LONG TERM GOAL #6   Title Patient's gait velocity will be >/= 1.64ft/s to indicate a limited community ambulator. (Target Date: 08/20/2017)   Time 4   Period Weeks   Status On-going     PT LONG TERM GOAL #7   Title Patient reports Activities of Balance Confindence score using FOTO >25% to indicate greater confidence in his balance. (Target Date: 08/20/2017)   Time 4   Period Months   Status On-going               Plan - 07/19/17 1105    Clinical Impression Statement Today's session focused on sit<>stand transfers to/from chairs with and without armrests at table as well as ascending/descending stairs, ramps, and curbs while wearing B LE and L UE prostheses using SPC with modified hand orthotic. PT provided visual, verbal, tactile and demo cues for sequencing, foot placement, use of AD and placement of UE prosthesis during stair activities. Pt requires mindGuard to minAssist for safety/to prevent LOB. He continues to show improvement and will benefit from continued therapy to progress toward goals   Rehab Potential Good   Clinical Impairments Affecting Rehab Potential arthritis, bicuspid aortic valve, BPH, carpal tunnel syndrome in R wrist, CAD, diverticulitis, GERD, gout, hyperlipidemia, HTN, DM type II,thrombocytopenia, thoracic ascending aortic aneurysm, severe aortic stenosis, amputation of bilateral hands except right thumb (12/11/2016) due to gangrene, bilateral transtibial amputations (12/11/2016) due to gangrene, R and L heart cath (10/20/2016)    PT Frequency 2x / week   PT Duration Other (comment)  60 days (9 weeks)   PT Treatment/Interventions ADLs/Self Care Home Management;Neuromuscular re-education;Balance training;Therapeutic exercise;Therapeutic activities;Functional mobility training;Stair training;Gait training;DME Instruction;Patient/family education;Prosthetic  Training   PT Next Visit Plan Assess STGs. Ambulation on a variety of surfaces, stairs   Consulted and Agree with Plan of Care Patient;Family member/caregiver   Family Member Consulted spouse      Patient will benefit from skilled therapeutic intervention in order to improve the following deficits and impairments:  Abnormal gait, Decreased activity tolerance, Decreased balance, Decreased coordination, Decreased range of motion, Decreased mobility, Decreased knowledge of use of DME, Decreased knowledge of precautions, Decreased endurance, Decreased skin integrity, Decreased scar mobility, Decreased strength, Difficulty walking, Postural dysfunction, Prosthetic Dependency  Visit Diagnosis: Unsteadiness on feet  Muscle weakness (generalized)  Other abnormalities of gait and mobility     Problem List Patient Active Problem List   Diagnosis Date Noted  . Amputation of both hands with complication, subsequent encounter 03/01/2017  . Diabetes ([place]) 02/19/2017  . Status post bilateral below knee amputation ([place]) 02/09/2017  . Wound disruption, post-op, skin, sequela 02/09/2017  . Debility 02/05/2017  . Ganglion upper arm, right   . Thrombosis of both upper extremities   . Suspected heparin induced thrombocytopenia (HIT) in hospitalized patient ([place])   . Gangrene of lower extremity ([place])   . Heparin induced thrombocytopenia (HIT) ([place])   . Tracheostomy status ([place])   . Chest tube in place   . Atherosclerosis of native arteries of extremities with gangrene, left leg ([place])   . Atherosclerosis of native arteries of extremities with gangrene, right leg ([place])   .  Acute on chronic diastolic CHF (congestive heart failure) ([place])   . Enteritis due to Clostridium difficile   . Anasarca   . FUO (fever of unknown origin)   . Acute encephalopathy   . Elevated LFTs   . DIC (disseminated intravascular coagulation) ([place]) 10/28/2016  . Postoperative atrial fibrillation ([place]) 10/24/2016  .  Cardiogenic shock ([place])   . Mitral regurgitation due to cusp prolapse 10/22/2016  . S/P aortic valve replacement with bioprosthetic valve 10/22/2016  . S/P ascending aortic aneurysm repair 10/22/2016  . S/P mitral valve repair 10/22/2016  . S/P CABG x 3 10/22/2016  . Thrombocytopenia ([place]) 10/22/2016  . Acute combined systolic and diastolic congestive heart failure ([place]) 10/22/2016  . Acute respiratory failure ([place]) 10/22/2016  . Thoracic ascending aortic aneurysm ([place]) 10/21/2016  . Coronary artery disease involving native coronary artery of native heart with unstable angina pectoris ([place]) 10/20/2016  . Severe aortic stenosis by prior echocardiography 10/19/2016  . Unstable angina ([place]) 10/19/2016  . Aortic valve stenosis 10/19/2016  . Bicuspid aortic valve 10/19/2016  . Trigger ring finger of right hand   . Wears glasses   . Gout   . Hypertension   . Carpal tunnel syndrome of right wrist   . Arthritis   . Snores     Gershon Crane, [place] 07/19/2017, 2:53 PM  Jamey Reas, PT, DPT PT Specializing in [place] 07/19/17 10:24 PM Phone:  [phone]  Fax:  [phone] [facility] [address] [place], [place] [address]  [facility] [address] [place], [place], [address] Phone: [phone]   Fax:  [phone]  Name: CARLAS VANDYNE MRN: [id number] Date of Birth: 05/20/1946

## 2017-07-21 ENCOUNTER — Encounter: Payer: Self-pay | Admitting: Physical Therapy

## 2017-07-21 ENCOUNTER — Ambulatory Visit: Payer: Medicare Other | Admitting: Physical Therapy

## 2017-07-21 DIAGNOSIS — R2681 Unsteadiness on feet: Secondary | ICD-10-CM

## 2017-07-21 DIAGNOSIS — R29898 Other symptoms and signs involving the musculoskeletal system: Secondary | ICD-10-CM | POA: Diagnosis not present

## 2017-07-21 DIAGNOSIS — M79641 Pain in right hand: Secondary | ICD-10-CM | POA: Diagnosis not present

## 2017-07-21 DIAGNOSIS — R293 Abnormal posture: Secondary | ICD-10-CM

## 2017-07-21 DIAGNOSIS — M6281 Muscle weakness (generalized): Secondary | ICD-10-CM

## 2017-07-21 DIAGNOSIS — R2689 Other abnormalities of gait and mobility: Secondary | ICD-10-CM | POA: Diagnosis not present

## 2017-07-21 NOTE — Therapy (Signed)
[facility] [address] [place], [place], [address] Phone: [phone]   Fax:  [phone]  Physical Therapy Treatment  Patient Details  Name: Scott [name] MRN: [id number] Date of Birth: 1946-04-21 Referring Provider: Meridee Score MD   Encounter Date: 07/21/2017      PT End of Session - 07/21/17 1949    Visit Number 25   Number of Visits 33   Date for PT Re-Evaluation 08/20/17   Authorization Type Medicare & G-codes every 10th visit    PT Start Time 1100   PT Stop Time 1149   PT Time Calculation (min) 49 min   Equipment Utilized During Treatment Gait belt  SPC   Activity Tolerance Patient tolerated treatment well   Behavior During Therapy [facility] for tasks assessed/performed      Past Medical History:  Diagnosis Date  . Arthritis    "hips, shoulders; knees; back" (10/20/2016)  . Bicuspid aortic valve   . BPH (benign prostatic hypertrophy)   . Carpal tunnel syndrome of right wrist   . Coronary artery disease involving native coronary artery of native heart with unstable angina pectoris ([place]) 10/20/2016  . Diverticulitis   . GERD (gastroesophageal reflux disease)   . Gout   . Heart murmur   . Hyperlipemia   . Hypertension   . Postoperative atrial fibrillation ([place]) 10/24/2016  . RLS (restless legs syndrome)   . S/P aortic valve replacement with bioprosthetic valve 10/22/2016   25 mm [facility] Ease bovine pericardial bioprosthetic tissue valve  . S/P ascending aortic aneurysm repair 10/22/2016   28 mm supracoronary straight graft replacement of ascending thoracic aortic aneurysm  . S/P CABG x 3 10/22/2016   Sequential LIMA to Diag and LAD, SVG to distal LAD, open vein harvest right thigh  . S/P mitral valve repair 10/22/2016   Artificial Gore-tex neochord placement x6 - posterior annuloplasty band placed but removed due to systolic anterior motion of mitral valve  . Severe aortic stenosis   . Snores     Never been tested for sleep apnea  . Thoracic ascending aortic aneurysm ([place]) 10/21/2016  . Thrombocytopenia ([place]) 10/22/2016  . Type II diabetes mellitus ([place])     Past Surgical History:  Procedure Laterality Date  . AMPUTATION Bilateral 12/11/2016   Procedure: AMPUTATION BELOW KNEE BILATERALLY;  Surgeon: [name], MD;  Location: [facility];  Service: Orthopedics;  Laterality: Bilateral;  . AMPUTATION Bilateral 12/11/2016   Procedure: AMPUTATION BILATERAL HANDS EXCEPT RIGHT THUMB;  Surgeon: [name], MD;  Location: [facility];  Service: Orthopedics;  Laterality: Bilateral;  . AORTIC VALVE REPLACEMENT N/A 10/22/2016   Procedure: AORTIC VALVE REPLACEMENT (AVR) WITH SIZE 25 MM MAGNA EASE PERICARDIAL BIOPROSTHESIS - AORTIC;  Surgeon: Rexene Alberts, MD;  Location: [facility];  Service: Open Heart Surgery;  Laterality: N/A;  . BONE EXCISION Right 02/01/2017   Procedure: EXCISION RIGHT INDEX METACARPAL HEAD;  Surgeon: [name], MD;  Location: [facility];  Service: Orthopedics;  Laterality: Right;  . CARDIAC CATHETERIZATION N/A 10/20/2016   Procedure: Right/Left Heart Cath and Coronary Angiography;  Surgeon: Leonie Man, MD;  Location: [facility];  Service: Cardiovascular;  Laterality: N/A;  . CARPAL TUNNEL RELEASE Right 11/28/2013   Procedure: RIGHT WRIST CARPAL TUNNEL RELEASE;  Surgeon: Lorn Junes, MD;  Location: [facility];  Service: Orthopedics;  Laterality: Right;  . CATARACT EXTRACTION W/ INTRAOCULAR LENS  IMPLANT, BILATERAL Bilateral 1978  . COLONOSCOPY    .  CORONARY ARTERY BYPASS GRAFT N/A 10/22/2016   Procedure: CORONARY ARTERY BYPASS GRAFTING (CABG)x 2 WITH LIMA TO DIAGONAL, OPEN  HARVESTING OF RIGHT SAPHENOUS VEIN FOR VEIN GRAFT TO LAD;  Surgeon: Rexene Alberts, MD;  Location: [facility];  Service: Open Heart Surgery;  Laterality: N/A;  . FRACTURE SURGERY    . INGUINAL HERNIA REPAIR Right 1998  . IR GENERIC HISTORICAL  12/07/2016   IR US GUIDE VASC ACCESS RIGHT  12/07/2016 Aletta Edouard, MD [facility]  . IR GENERIC HISTORICAL  12/07/2016   IR RADIOLOGY PERIPHERAL GUIDED IV START 12/07/2016 Aletta Edouard, MD [facility]  . IR GENERIC HISTORICAL  12/07/2016   IR GASTROSTOMY TUBE MOD SED 12/07/2016 Aletta Edouard, MD [facility]  . LIPOMA EXCISION Right 2008   "side of my head"  . MITRAL VALVE REPAIR N/A 10/22/2016   Procedure: MITRAL VALVE REPAIR (MVR) WITH SIZE 30 SORIN ANNULOFLEX ANNULOPLASTY RING WITH SUBSEQUENT REMOVAL OF RING;  Surgeon: Rexene Alberts, MD;  Location: [facility];  Service: Open Heart Surgery;  Laterality: N/A;  . PENECTOMY  2007   Peyronie's disease   . PENILE PROSTHESIS IMPLANT  2009  . REMOVAL OF PENILE PROSTHESIS N/A 02/01/2017   Procedure: REMOVAL OF PENILE PROSTHESIS;  Surgeon: Kathie Rhodes, MD;  Location: [facility];  Service: Urology;  Laterality: N/A;  . SHOULDER OPEN ROTATOR CUFF REPAIR Right 2006  . STERNAL CLOSURE N/A 10/26/2016   Procedure: STERNAL WASHOUT AND DELAYED PRIMARY CLOSURE;  Surgeon: Rexene Alberts, MD;  Location: [facility];  Service: Thoracic;  Laterality: N/A;  . TEE WITHOUT CARDIOVERSION N/A 10/26/2016   Procedure: TRANSESOPHAGEAL ECHOCARDIOGRAM (TEE);  Surgeon: Rexene Alberts, MD;  Location: [facility];  Service: Thoracic;  Laterality: N/A;  . TEE WITHOUT CARDIOVERSION N/A 10/22/2016   Procedure: TRANSESOPHAGEAL ECHOCARDIOGRAM (TEE);  Surgeon: Rexene Alberts, MD;  Location: [facility];  Service: Open Heart Surgery;  Laterality: N/A;  . THORACIC AORTIC ANEURYSM REPAIR  10/22/2016   Procedure: ASCENDING AORTIC  ANEURYSM REPAIR (AAA) WITH 28 MM HEMASHIELD PLATINUM WOVEN DOUBLE VELOUR VASCULAR GRAFT;  Surgeon: Rexene Alberts, MD;  Location: [facility];  Service: Open Heart Surgery;;  . TONSILLECTOMY  ~ 1955  . TRACHEOSTOMY TUBE PLACEMENT N/A 11/09/2016   Procedure: TRACHEOSTOMY;  Surgeon: Rexene Alberts, MD;  Location: [facility];  Service: Thoracic;  Laterality: N/A;  . TRANSURETHRAL RESECTION OF PROSTATE  2005  . TRIGGER FINGER  RELEASE Bilateral    several lt and rt hands  . TRIGGER FINGER RELEASE Right 11/28/2013   Procedure: RIGHT TRIGGER FINGER  RELEASE (TENDON SHEATH INCISION);  Surgeon: Lorn Junes, MD;  Location: [facility];  Service: Orthopedics;  Laterality: Right;  Marland Kitchen VIDEO BRONCHOSCOPY N/A 11/09/2016   Procedure: VIDEO BRONCHOSCOPY;  Surgeon: Rexene Alberts, MD;  Location: [facility];  Service: Thoracic;  Laterality: N/A;  . WRIST FRACTURE SURGERY Right ~ 1959    There were no vitals filed for this visit.      Subjective Assessment - 07/21/17 1104    Subjective He looked at gun shooting websites. He thinks they may be able to do something.    Patient is accompained by: Family member   Pertinent History arthritis, bicuspid aortic valve, BPH, carpal tunnel syndrome in R wrist, CAD, diverticulitis, GERD, gout, hyperlipidemia, HTN, DM type II,thrombocytopenia, thoracic ascending aortic aneurysm, severe aortic stenosis, amputation of bilateral hands except right thumb (12/11/2016) due to gangrene, bilateral transtibial amputations (12/11/2016) due to gangrene, R and L heart cath (  10/20/2016)    Limitations Standing;Walking;House hold activities   Patient Stated Goals use prostheses to walk including stairs, outdoors, target shooting, drive   Currently in Pain? No/denies                         [facility] Adult PT Treatment/Exercise - 07/21/17 1100      Transfers   Transfers Sit to Stand;Stand to Sit   Sit to Stand 5: Supervision;With upper extremity assist;From chair/3-in-1;4: Min assist  chairs without armrests   Sit to Stand Details Verbal cues for sequencing;Verbal cues for technique;Manual facilitation for weight shifting  when using chair without arm rests   Stand to Sit 5: Supervision;With upper extremity assist;To chair/3-in-1;4: Min assist  chairs without armrests   Stand to Sit Details (indicate cue type and reason) Verbal cues for sequencing;Verbal cues for  technique;Manual facilitation for weight shifting  when using chair without arm rests     Ambulation/Gait   Ambulation/Gait Yes   Ambulation/Gait Assistance 4: Min guard   Ambulation/Gait Assistance Details verbal & tactile cues on step width & upright posture   Ambulation Distance (Feet) 300 Feet  300' outdoor including 50' on grass & 200'   Assistive device Straight cane;Prostheses  SPC w/ modified hand orthotic; B LE and L UE prostheses   Gait Pattern Step-through pattern;Decreased arm swing - left;Decreased step length - right;Decreased stride length;Trunk rotated posteriorly on right   Ambulation Surface Indoor;Level;Outdoor;Paved;Grass   Stairs Yes   Stairs Assistance 4: Min guard;4: Min assist   Stairs Assistance Details (indicate cue type and reason) verbal cues on reciprocal pattern / technique   Stair Management Technique Two rails;Alternating pattern;Forwards   Number of Stairs 4  3 reps   Ramp 4: Min assist  SPC w/ modified hand orthotic; B LE and L UE prostheses   Ramp Details (indicate cue type and reason) verbal cues on technique, wt shift & posture   Curb 4: Min assist  SPC w/ modified hand orthotic; B LE and L UE prostheses   Curb Details (indicate cue type and reason) verbal, demo & tactile cues on sequence, technique with momentum & step thru pattern     High Level Balance   High Level Balance Activities Side stepping;Backward walking;Turns;Head turns  Bil TTA prostheses & cane   High Level Balance Comments tactile & verbal cues on wt shift, posture & maintaining path     Prosthetics   Prosthetic Care Comments  PT recommended at next prosthetic appt 1. new shrinkers  2. ?adding more pads  3. 5-ply socks & 4. alignment check with gait with cane.  PT instructed to continue wearing Tegaderm on R LE as previously instructed.  UE / hand motions for opposition to create valley between thenar & hypothenar bellies to facilitate some control with grip.    Current prosthetic  wear tolerance (days/week)  daily   Current prosthetic wear tolerance (#hours/day)  5 hrs on, 1 hr off rotation for ~15 hrs total /day   Residual limb condition  RLE wound on tibial tuberosity with epitheal coverage.   Education Provided Residual limb care;Correct ply sock adjustment   Person(s) Educated Patient;Spouse   Education Method Explanation;Demonstration;Verbal cues;Tactile cues   Education Method Verbalized understanding;Returned demonstration;Tactile cues required;Verbal cues required;Needs further instruction                  PT Short Term Goals - 07/21/17 1949      PT SHORT TERM GOAL #1  Title Patient tolerates bil. LE prostheses wear >75% of awake hours without skin issues.    Baseline wound on RLE healed but still using Tegaderm as protection.    Time 1   Period Months   Status Partially Met     PT SHORT TERM GOAL #2   Title Patient performs sit to/from stand from chairs without armrests without UE support to stablize modified independent.    Baseline MET 07/21/2017   Time 1   Period Months   Status Achieved     PT SHORT TERM GOAL #3   Title Patient reaches 10" anteriorly & within 5" of floor without UE support with supervision.    Baseline MET 07/20/2017   Time 1   Period Months   Status Achieved     PT SHORT TERM GOAL #4   Title Patient ambulates 300' with single UE support or less with minA.    Baseline MET 07/21/2017   Time 1   Period Months   Status Achieved     PT SHORT TERM GOAL #5   Title Patient negotiates ramps & curbs with cane or less with bil. prostheses with minA.    Baseline MET 07/21/17   Time 1   Period Months   Status Achieved           PT Long Term Goals - 06/21/17 2106      PT LONG TERM GOAL #1   Title Patient demonstrates & verbalizes understanding of proper prosthetic care including independent donning to enable safe use of LE prostheses.    Time 2   Period Months   Status Revised   Target Date 08/20/17     PT  LONG TERM GOAL #2   Title Patient will tolerate prostheses wear >90% of awake hours without skin issues or limb pain to demonstrate a decrease in his risk of falling.   Time 2   Period Months   Status On-going   Target Date 08/20/17     PT LONG TERM GOAL #3   Title Patient performs standing balance activities with intermittent UE support reaching 10", picking up objects from floor and looks over shoulders with weight shift, trunk rotation with prostheses independently.   Time 4   Period Weeks   Status On-going   Target Date 08/20/17     PT LONG TERM GOAL #4   Title Patient will ambulate 1000 feet including outdoor surfaces with LRAD and prostheses modified independently to enable community mobility.     Time 4   Period Months   Status On-going   Target Date 08/20/17     PT LONG TERM GOAL #5   Title Patient will negotiate ramp/curbs and stairs with LRAD and prostheses modified independent for community access.    Time 4   Period Months   Status On-going   Target Date 08/20/17     PT LONG TERM GOAL #6   Title Patient's gait velocity will be >/= 1.79f/s to indicate a limited community ambulator. (Target Date: 08/20/2017)   Time 4   Period Weeks   Status On-going     PT LONG TERM GOAL #7   Title Patient reports Activities of Balance Confindence score using FOTO >25% to indicate greater confidence in his balance. (Target Date: 08/20/2017)   Time 4   Period Months   Status On-going               Plan - 07/21/17 1951    Clinical Impression Statement Patient met all  STGs indicating progress towards community moblity with Bil. TTA prostheses & cane. He reports ability to do more things outside of PT and increased activity tolerance.    Rehab Potential Good   Clinical Impairments Affecting Rehab Potential arthritis, bicuspid aortic valve, BPH, carpal tunnel syndrome in R wrist, CAD, diverticulitis, GERD, gout, hyperlipidemia, HTN, DM type II,thrombocytopenia, thoracic  ascending aortic aneurysm, severe aortic stenosis, amputation of bilateral hands except right thumb (12/11/2016) due to gangrene, bilateral transtibial amputations (12/11/2016) due to gangrene, R and L heart cath (10/20/2016)    PT Frequency 2x / week   PT Duration Other (comment)  60 days (9 weeks)   PT Treatment/Interventions ADLs/Self Care Home Management;Neuromuscular re-education;Balance training;Therapeutic exercise;Therapeutic activities;Functional mobility training;Stair training;Gait training;DME Instruction;Patient/family education;Prosthetic Training   PT Next Visit Plan work towards [place].  Ambulation on a variety of surfaces, stairs with cane for community & no UE device for household   Consulted and Agree with Plan of Care Patient;Family member/caregiver   Family Member Consulted spouse      Patient will benefit from skilled therapeutic intervention in order to improve the following deficits and impairments:  Abnormal gait, Decreased activity tolerance, Decreased balance, Decreased coordination, Decreased range of motion, Decreased mobility, Decreased knowledge of use of DME, Decreased knowledge of precautions, Decreased endurance, Decreased skin integrity, Decreased scar mobility, Decreased strength, Difficulty walking, Postural dysfunction, Prosthetic Dependency  Visit Diagnosis: Unsteadiness on feet  Muscle weakness (generalized)  Other abnormalities of gait and mobility  Other symptoms and signs involving the musculoskeletal system  Abnormal posture     Problem List Patient Active Problem List   Diagnosis Date Noted  . Amputation of both hands with complication, subsequent encounter 03/01/2017  . Diabetes ([place]) 02/19/2017  . Status post bilateral below knee amputation ([place]) 02/09/2017  . Wound disruption, post-op, skin, sequela 02/09/2017  . Debility 02/05/2017  . Ganglion upper arm, right   . Thrombosis of both upper extremities   . Suspected heparin induced  thrombocytopenia (HIT) in hospitalized patient ([place])   . Gangrene of lower extremity ([place])   . Heparin induced thrombocytopenia (HIT) ([place])   . Tracheostomy status ([place])   . Chest tube in place   . Atherosclerosis of native arteries of extremities with gangrene, left leg ([place])   . Atherosclerosis of native arteries of extremities with gangrene, right leg ([place])   . Acute on chronic diastolic CHF (congestive heart failure) ([place])   . Enteritis due to Clostridium difficile   . Anasarca   . FUO (fever of unknown origin)   . Acute encephalopathy   . Elevated LFTs   . DIC (disseminated intravascular coagulation) ([place]) 10/28/2016  . Postoperative atrial fibrillation ([place]) 10/24/2016  . Cardiogenic shock ([place])   . Mitral regurgitation due to cusp prolapse 10/22/2016  . S/P aortic valve replacement with bioprosthetic valve 10/22/2016  . S/P ascending aortic aneurysm repair 10/22/2016  . S/P mitral valve repair 10/22/2016  . S/P CABG x 3 10/22/2016  . Thrombocytopenia ([place]) 10/22/2016  . Acute combined systolic and diastolic congestive heart failure ([place]) 10/22/2016  . Acute respiratory failure ([place]) 10/22/2016  . Thoracic ascending aortic aneurysm ([place]) 10/21/2016  . Coronary artery disease involving native coronary artery of native heart with unstable angina pectoris ([place]) 10/20/2016  . Severe aortic stenosis by prior echocardiography 10/19/2016  . Unstable angina ([place]) 10/19/2016  . Aortic valve stenosis 10/19/2016  . Bicuspid aortic valve 10/19/2016  . Trigger ring finger of right hand   . Wears glasses   .  Gout   . Hypertension   . Carpal tunnel syndrome of right wrist   . Arthritis   . Snores     Gelsey Amyx PT, DPT 07/21/2017, 7:55 PM  [facility] [address] [place], [place], [address] Phone: [phone]   Fax:  [phone]  Name: Scott [name] MRN: [id number] Date of Birth: [birth year]

## 2017-07-28 ENCOUNTER — Ambulatory Visit: Payer: Medicare Other | Attending: Orthopedic Surgery | Admitting: Physical Therapy

## 2017-07-28 ENCOUNTER — Encounter: Payer: Self-pay | Admitting: Physical Therapy

## 2017-07-28 DIAGNOSIS — M6281 Muscle weakness (generalized): Secondary | ICD-10-CM | POA: Diagnosis not present

## 2017-07-28 DIAGNOSIS — R2681 Unsteadiness on feet: Secondary | ICD-10-CM | POA: Diagnosis not present

## 2017-07-28 DIAGNOSIS — R293 Abnormal posture: Secondary | ICD-10-CM | POA: Insufficient documentation

## 2017-07-28 DIAGNOSIS — R29898 Other symptoms and signs involving the musculoskeletal system: Secondary | ICD-10-CM | POA: Insufficient documentation

## 2017-07-28 DIAGNOSIS — R208 Other disturbances of skin sensation: Secondary | ICD-10-CM | POA: Insufficient documentation

## 2017-07-28 DIAGNOSIS — R2689 Other abnormalities of gait and mobility: Secondary | ICD-10-CM | POA: Diagnosis not present

## 2017-07-29 ENCOUNTER — Ambulatory Visit (INDEPENDENT_AMBULATORY_CARE_PROVIDER_SITE_OTHER): Payer: Medicare Other | Admitting: *Deleted

## 2017-07-29 ENCOUNTER — Telehealth: Payer: Self-pay | Admitting: Cardiology

## 2017-07-29 DIAGNOSIS — I82603 Acute embolism and thrombosis of unspecified veins of upper extremity, bilateral: Secondary | ICD-10-CM

## 2017-07-29 DIAGNOSIS — Z9889 Other specified postprocedural states: Secondary | ICD-10-CM | POA: Diagnosis not present

## 2017-07-29 DIAGNOSIS — I4891 Unspecified atrial fibrillation: Secondary | ICD-10-CM | POA: Diagnosis not present

## 2017-07-29 DIAGNOSIS — Q231 Congenital insufficiency of aortic valve: Secondary | ICD-10-CM

## 2017-07-29 DIAGNOSIS — I9789 Other postprocedural complications and disorders of the circulatory system, not elsewhere classified: Secondary | ICD-10-CM

## 2017-07-29 DIAGNOSIS — Z5181 Encounter for therapeutic drug level monitoring: Secondary | ICD-10-CM

## 2017-07-29 LAB — POCT INR: INR: 2.6

## 2017-07-29 NOTE — Telephone Encounter (Signed)
Advised wife if patient has not had a reaction to the flu shot in the past, it would be fine for him to have the flu shot

## 2017-07-29 NOTE — Telephone Encounter (Signed)
Patient wants to know if he can take Flu Shot

## 2017-07-29 NOTE — Therapy (Signed)
[facility] [address] [place], [place], [address] Phone: [phone]   Fax:  [phone]  Physical Therapy Treatment  Patient Details  Name: Scott [name] MRN: [id number] Date of Birth: [birth year] Referring Provider: Meridee Score MD   Encounter Date: 07/28/2017   07/28/17 1108  PT Visits / Re-Eval  Visit Number 26  Number of Visits 33  Date for PT Re-Evaluation 08/20/17  Authorization  Authorization Type Medicare & G-codes every 10th visit   PT Time Calculation  PT Start Time 1102  PT Stop Time 1145  PT Time Calculation (min) 43 min  PT - End of Session  Equipment Utilized During Treatment Gait belt [facility])  Activity Tolerance Patient tolerated treatment well  Behavior During Therapy [facility] for tasks assessed/performed     Past Medical History:  Diagnosis Date  . Arthritis    "hips, shoulders; knees; back" (10/20/2016)  . Bicuspid aortic valve   . BPH (benign prostatic hypertrophy)   . Carpal tunnel syndrome of right wrist   . Coronary artery disease involving native coronary artery of native heart with unstable angina pectoris ([place]) 10/20/2016  . Diverticulitis   . GERD (gastroesophageal reflux disease)   . Gout   . Heart murmur   . Hyperlipemia   . Hypertension   . Postoperative atrial fibrillation ([place]) 10/24/2016  . RLS (restless legs syndrome)   . S/P aortic valve replacement with bioprosthetic valve 10/22/2016   25 mm [facility] Ease bovine pericardial bioprosthetic tissue valve  . S/P ascending aortic aneurysm repair 10/22/2016   28 mm supracoronary straight graft replacement of ascending thoracic aortic aneurysm  . S/P CABG x 3 10/22/2016   Sequential LIMA to Diag and LAD, SVG to distal LAD, open vein harvest right thigh  . S/P mitral valve repair 10/22/2016   Artificial Gore-tex neochord placement x6 - posterior annuloplasty band placed but removed due to systolic anterior motion of mitral  valve  . Severe aortic stenosis   . Snores    Never been tested for sleep apnea  . Thoracic ascending aortic aneurysm ([place]) 10/21/2016  . Thrombocytopenia ([place]) 10/22/2016  . Type II diabetes mellitus ([place])     Past Surgical History:  Procedure Laterality Date  . AMPUTATION Bilateral 12/11/2016   Procedure: AMPUTATION BELOW KNEE BILATERALLY;  Surgeon: [name], MD;  Location: [place];  Service: Orthopedics;  Laterality: Bilateral;  . AMPUTATION Bilateral 12/11/2016   Procedure: AMPUTATION BILATERAL HANDS EXCEPT RIGHT THUMB;  Surgeon: [name], MD;  Location: [place];  Service: Orthopedics;  Laterality: Bilateral;  . AORTIC VALVE REPLACEMENT N/A 10/22/2016   Procedure: AORTIC VALVE REPLACEMENT (AVR) WITH SIZE 25 MM MAGNA EASE PERICARDIAL BIOPROSTHESIS - AORTIC;  Surgeon: Rexene Alberts, MD;  Location: [place];  Service: Open Heart Surgery;  Laterality: N/A;  . BONE EXCISION Right 02/01/2017   Procedure: EXCISION RIGHT INDEX METACARPAL HEAD;  Surgeon: [name], MD;  Location: [place];  Service: Orthopedics;  Laterality: Right;  . CARDIAC CATHETERIZATION N/A 10/20/2016   Procedure: Right/Left Heart Cath and Coronary Angiography;  Surgeon: Leonie Man, MD;  Location: [place] [facility];  Service: Cardiovascular;  Laterality: N/A;  . CARPAL TUNNEL RELEASE Right 11/28/2013   Procedure: RIGHT WRIST CARPAL TUNNEL RELEASE;  Surgeon: Lorn Junes, MD;  Location: [place];  Service: Orthopedics;  Laterality: Right;  . CATARACT EXTRACTION W/ INTRAOCULAR LENS  IMPLANT, BILATERAL Bilateral 1978  . COLONOSCOPY    . CORONARY  ARTERY BYPASS GRAFT N/A 10/22/2016   Procedure: CORONARY ARTERY BYPASS GRAFTING (CABG)x 2 WITH LIMA TO DIAGONAL, OPEN  HARVESTING OF RIGHT SAPHENOUS VEIN FOR VEIN GRAFT TO LAD;  Surgeon: Rexene Alberts, MD;  Location: [place];  Service: Open Heart Surgery;  Laterality: N/A;  . FRACTURE SURGERY    . INGUINAL HERNIA REPAIR Right 1998  . IR GENERIC HISTORICAL   12/07/2016   IR US GUIDE VASC ACCESS RIGHT 12/07/2016 Aletta Edouard, MD [facility]  . IR GENERIC HISTORICAL  12/07/2016   IR RADIOLOGY PERIPHERAL GUIDED IV START 12/07/2016 Aletta Edouard, MD [facility]  . IR GENERIC HISTORICAL  12/07/2016   IR GASTROSTOMY TUBE MOD SED 12/07/2016 Aletta Edouard, MD [facility]  . LIPOMA EXCISION Right 2008   "side of my head"  . MITRAL VALVE REPAIR N/A 10/22/2016   Procedure: MITRAL VALVE REPAIR (MVR) WITH SIZE 30 SORIN ANNULOFLEX ANNULOPLASTY RING WITH SUBSEQUENT REMOVAL OF RING;  Surgeon: Rexene Alberts, MD;  Location: [place];  Service: Open Heart Surgery;  Laterality: N/A;  . PENECTOMY  2007   Peyronie's disease   . PENILE PROSTHESIS IMPLANT  2009  . REMOVAL OF PENILE PROSTHESIS N/A 02/01/2017   Procedure: REMOVAL OF PENILE PROSTHESIS;  Surgeon: Kathie Rhodes, MD;  Location: [place];  Service: Urology;  Laterality: N/A;  . SHOULDER OPEN ROTATOR CUFF REPAIR Right 2006  . STERNAL CLOSURE N/A 10/26/2016   Procedure: STERNAL WASHOUT AND DELAYED PRIMARY CLOSURE;  Surgeon: Rexene Alberts, MD;  Location: [place];  Service: Thoracic;  Laterality: N/A;  . TEE WITHOUT CARDIOVERSION N/A 10/26/2016   Procedure: TRANSESOPHAGEAL ECHOCARDIOGRAM (TEE);  Surgeon: Rexene Alberts, MD;  Location: [place];  Service: Thoracic;  Laterality: N/A;  . TEE WITHOUT CARDIOVERSION N/A 10/22/2016   Procedure: TRANSESOPHAGEAL ECHOCARDIOGRAM (TEE);  Surgeon: Rexene Alberts, MD;  Location: [place];  Service: Open Heart Surgery;  Laterality: N/A;  . THORACIC AORTIC ANEURYSM REPAIR  10/22/2016   Procedure: ASCENDING AORTIC  ANEURYSM REPAIR (AAA) WITH 28 MM HEMASHIELD PLATINUM WOVEN DOUBLE VELOUR VASCULAR GRAFT;  Surgeon: Rexene Alberts, MD;  Location: [place];  Service: Open Heart Surgery;;  . TONSILLECTOMY  ~ 1955  . TRACHEOSTOMY TUBE PLACEMENT N/A 11/09/2016   Procedure: TRACHEOSTOMY;  Surgeon: Rexene Alberts, MD;  Location: [place];  Service: Thoracic;  Laterality: N/A;  . TRANSURETHRAL  RESECTION OF PROSTATE  2005  . TRIGGER FINGER RELEASE Bilateral    several lt and rt hands  . TRIGGER FINGER RELEASE Right 11/28/2013   Procedure: RIGHT TRIGGER FINGER  RELEASE (TENDON SHEATH INCISION);  Surgeon: Lorn Junes, MD;  Location: [place];  Service: Orthopedics;  Laterality: Right;  Marland Kitchen VIDEO BRONCHOSCOPY N/A 11/09/2016   Procedure: VIDEO BRONCHOSCOPY;  Surgeon: Rexene Alberts, MD;  Location: [place];  Service: Thoracic;  Laterality: N/A;  . WRIST FRACTURE SURGERY Right ~ 1959    There were no vitals filed for this visit.     07/28/17 1106  Symptoms/Limitations  Subjective Reports one cable broke on UE prosthesis so not able to use if well as it's difficult to extend it out. No falls or pain to report.   Patient is accompained by: Family member  Pertinent History arthritis, bicuspid aortic valve, BPH, carpal tunnel syndrome in R wrist, CAD, diverticulitis, GERD, gout, hyperlipidemia, HTN, DM type II,thrombocytopenia, thoracic ascending aortic aneurysm, severe aortic stenosis, amputation of bilateral hands except right thumb (12/11/2016) due to gangrene, bilateral transtibial amputations (12/11/2016) due to gangrene,  R and L heart cath (10/20/2016)   Limitations Standing;Walking;House hold activities  Patient Stated Goals use prostheses to walk including stairs, outdoors, target shooting, drive  Pain Assessment  Currently in Pain? No/denies  Pain Score 0      07/28/17 1108  Transfers  Transfers Sit to Stand;Stand to Sit  Sit to Stand 5: Supervision;With upper extremity assist;From chair/3-in-1;4: Min assist  Stand to Sit 5: Supervision;With upper extremity assist;To chair/3-in-1;4: Min assist  Ambulation/Gait  Ambulation/Gait Yes  Ambulation/Gait Assistance 4: Min guard;4: Min assist  Ambulation/Gait Assistance Details cues needed for posture, base of support and weight shifting. multiple laps in ~50 foot hallway with pt carrying cane vs using it, occasional  touch to wall for balance with min guard to min assist for balance.  Ambulation Distance (Feet) 100 Feet (x2 with cane, multiple laps in hall no device)  Assistive device Straight cane;Prostheses;None  Gait Pattern Step-through pattern;Decreased arm swing - left;Decreased step length - right;Decreased stride length;Trunk rotated posteriorly on right  Ambulation Surface Level;Indoor  High Level Balance  High Level Balance Activities Side stepping  High Level Balance Comments next to counter with cane support only  x 4 laps toward each side with min guard to min assist for balance.  Prosthetics  Prosthetic Care Comments  Pt and spouse report the only issue with pt getting himself completely ready for bed at night is he is uable to fully get his Dr. Sharol Given socks on due to how tight they are. Discussed use of an aide/device to assist with this. Pt/spouse borrowed clinics metal frame sock donner to try it at home before purchasing one of their own.                       Current prosthetic wear tolerance (days/week)  daily  Current prosthetic wear tolerance (#hours/day)  5 hrs on, 1 hr off rotation for ~15 hrs total /day  Residual limb condition  RLE wound on tibial tuberosity with epitheal coverage.covered with Tegaderm. Blood noted on inner thigh of right limb along incision. Area cleaed with it appearing to be a scratch of some kind. Tegaderm applied here as welll for decr friction from liner on area.   Education Provided Residual limb care;Proper wear schedule/adjustment;Proper weight-bearing schedule/adjustment  Person(s) Educated Patient;Spouse  Education Method Explanation;Demonstration;Verbal cues  Education Method Returned demonstration;Verbalized understanding;Verbal cues required;Needs further instruction  Donning Prosthesis 6  Doffing Prosthesis 6           PT Short Term Goals - 07/21/17 1949      PT SHORT TERM GOAL #1   Title Patient tolerates bil. LE prostheses wear >75% of awake  hours without skin issues.    Baseline wound on RLE healed but still using Tegaderm as protection.    Time 1   Period Months   Status Partially Met     PT SHORT TERM GOAL #2   Title Patient performs sit to/from stand from chairs without armrests without UE support to stablize modified independent.    Baseline MET 07/21/2017   Time 1   Period Months   Status Achieved     PT SHORT TERM GOAL #3   Title Patient reaches 10" anteriorly & within 5" of floor without UE support with supervision.    Baseline MET 07/20/2017   Time 1   Period Months   Status Achieved     PT SHORT TERM GOAL #4   Title Patient ambulates 300' with single UE support or  less with minA.    Baseline MET 07/21/2017   Time 1   Period Months   Status Achieved     PT SHORT TERM GOAL #5   Title Patient negotiates ramps & curbs with cane or less with bil. prostheses with minA.    Baseline MET 07/21/17   Time 1   Period Months   Status Achieved           PT Long Term Goals - 06/21/17 2106      PT LONG TERM GOAL #1   Title Patient demonstrates & verbalizes understanding of proper prosthetic care including independent donning to enable safe use of LE prostheses.    Time 2   Period Months   Status Revised   Target Date 08/20/17     PT LONG TERM GOAL #2   Title Patient will tolerate prostheses wear >90% of awake hours without skin issues or limb pain to demonstrate a decrease in his risk of falling.   Time 2   Period Months   Status On-going   Target Date 08/20/17     PT LONG TERM GOAL #3   Title Patient performs standing balance activities with intermittent UE support reaching 10", picking up objects from floor and looks over shoulders with weight shift, trunk rotation with prostheses independently.   Time 4   Period Weeks   Status On-going   Target Date 08/20/17     PT LONG TERM GOAL #4   Title Patient will ambulate 1000 feet including outdoor surfaces with LRAD and prostheses modified independently to  enable community mobility.     Time 4   Period Months   Status On-going   Target Date 08/20/17     PT LONG TERM GOAL #5   Title Patient will negotiate ramp/curbs and stairs with LRAD and prostheses modified independent for community access.    Time 4   Period Months   Status On-going   Target Date 08/20/17     PT LONG TERM GOAL #6   Title Patient's gait velocity will be >/= 1.34f/s to indicate a limited community ambulator. (Target Date: 08/20/2017)   Time 4   Period Weeks   Status On-going     PT LONG TERM GOAL #7   Title Patient reports Activities of Balance Confindence score using FOTO >25% to indicate greater confidence in his balance. (Target Date: 08/20/2017)   Time 4   Period Months   Status On-going      07/28/17 1108  Plan  Clinical Impression Statement Today's skilled session addressed donning of Dr. DJess Barterscompression socks for home, gait with cane/no AD and balance with no issues noted or reported. Pt is making steady progress toward goals and should benefit from continued PT to progress toward unmet goals.  Pt will benefit from skilled therapeutic intervention in order to improve on the following deficits Abnormal gait;Decreased activity tolerance;Decreased balance;Decreased coordination;Decreased range of motion;Decreased mobility;Decreased knowledge of use of DME;Decreased knowledge of precautions;Decreased endurance;Decreased skin integrity;Decreased scar mobility;Decreased strength;Difficulty walking;Postural dysfunction;Prosthetic Dependency  Rehab Potential Good  Clinical Impairments Affecting Rehab Potential arthritis, bicuspid aortic valve, BPH, carpal tunnel syndrome in R wrist, CAD, diverticulitis, GERD, gout, hyperlipidemia, HTN, DM type II,thrombocytopenia, thoracic ascending aortic aneurysm, severe aortic stenosis, amputation of bilateral hands except right thumb (12/11/2016) due to gangrene, bilateral transtibial amputations (12/11/2016) due to gangrene, R and L  heart cath (10/20/2016)   PT Frequency 2x / week  PT Duration Other (comment) (60 days (9 weeks))  PT Treatment/Interventions  ADLs/Self Care Home Management;Neuromuscular re-education;Balance training;Therapeutic exercise;Therapeutic activities;Functional mobility training;Stair training;Gait training;DME Instruction;Patient/family education;Prosthetic Training  PT Next Visit Plan work towards [place].  Ambulation on a variety of surfaces, stairs with cane for community & no UE device for household  Consulted and Agree with Plan of Care Patient;Family member/caregiver  Family Member Consulted spouse        Patient will benefit from skilled therapeutic intervention in order to improve the following deficits and impairments:  Abnormal gait, Decreased activity tolerance, Decreased balance, Decreased coordination, Decreased range of motion, Decreased mobility, Decreased knowledge of use of DME, Decreased knowledge of precautions, Decreased endurance, Decreased skin integrity, Decreased scar mobility, Decreased strength, Difficulty walking, Postural dysfunction, Prosthetic Dependency  Visit Diagnosis: Unsteadiness on feet  Muscle weakness (generalized)  Other abnormalities of gait and mobility     Problem List Patient Active Problem List   Diagnosis Date Noted  . Amputation of both hands with complication, subsequent encounter 03/01/2017  . Diabetes ([place]) 02/19/2017  . Status post bilateral below knee amputation ([place]) 02/09/2017  . Wound disruption, post-op, skin, sequela 02/09/2017  . Debility 02/05/2017  . Ganglion upper arm, right   . Thrombosis of both upper extremities   . Suspected heparin induced thrombocytopenia (HIT) in hospitalized patient ([place])   . Gangrene of lower extremity ([place])   . Heparin induced thrombocytopenia (HIT) ([place])   . Tracheostomy status ([place])   . Chest tube in place   . Atherosclerosis of native arteries of extremities with gangrene, left leg ([place])   .  Atherosclerosis of native arteries of extremities with gangrene, right leg ([place])   . Acute on chronic diastolic CHF (congestive heart failure) ([place])   . Enteritis due to Clostridium difficile   . Anasarca   . FUO (fever of unknown origin)   . Acute encephalopathy   . Elevated LFTs   . DIC (disseminated intravascular coagulation) ([place]) 10/28/2016  . Postoperative atrial fibrillation ([place]) 10/24/2016  . Cardiogenic shock ([place])   . Mitral regurgitation due to cusp prolapse 10/22/2016  . S/P aortic valve replacement with bioprosthetic valve 10/22/2016  . S/P ascending aortic aneurysm repair 10/22/2016  . S/P mitral valve repair 10/22/2016  . S/P CABG x 3 10/22/2016  . Thrombocytopenia ([place]) 10/22/2016  . Acute combined systolic and diastolic congestive heart failure ([place]) 10/22/2016  . Acute respiratory failure ([place]) 10/22/2016  . Thoracic ascending aortic aneurysm ([place]) 10/21/2016  . Coronary artery disease involving native coronary artery of native heart with unstable angina pectoris ([place]) 10/20/2016  . Severe aortic stenosis by prior echocardiography 10/19/2016  . Unstable angina ([place]) 10/19/2016  . Aortic valve stenosis 10/19/2016  . Bicuspid aortic valve 10/19/2016  . Trigger ring finger of right hand   . Wears glasses   . Gout   . Hypertension   . Carpal tunnel syndrome of right wrist   . Arthritis   . Snores     [place], [place], [facility] [address], [place], [place] [address] [phone] 07/29/17, 10:58 PM  Name: Scott [name] MRN: [id number] Date of Birth: 18-Sep-1946

## 2017-07-30 ENCOUNTER — Ambulatory Visit: Payer: Medicare Other | Admitting: Physical Therapy

## 2017-07-30 ENCOUNTER — Other Ambulatory Visit: Payer: Self-pay | Admitting: Cardiology

## 2017-07-30 DIAGNOSIS — R293 Abnormal posture: Secondary | ICD-10-CM | POA: Diagnosis not present

## 2017-07-30 DIAGNOSIS — M6281 Muscle weakness (generalized): Secondary | ICD-10-CM | POA: Diagnosis not present

## 2017-07-30 DIAGNOSIS — R2681 Unsteadiness on feet: Secondary | ICD-10-CM

## 2017-07-30 DIAGNOSIS — R2689 Other abnormalities of gait and mobility: Secondary | ICD-10-CM | POA: Diagnosis not present

## 2017-07-30 DIAGNOSIS — R208 Other disturbances of skin sensation: Secondary | ICD-10-CM | POA: Diagnosis not present

## 2017-07-30 DIAGNOSIS — R29898 Other symptoms and signs involving the musculoskeletal system: Secondary | ICD-10-CM | POA: Diagnosis not present

## 2017-07-30 NOTE — Therapy (Signed)
[facility] [address] [place], [place], [address] Phone: [phone]   Fax:  [phone]  Physical Therapy Treatment  Patient Details  Name: Scott [name] MRN: [id number] Date of Birth: 12/04/1945 Referring Provider: Meridee Score MD   Encounter Date: 07/30/2017      PT End of Session - 07/30/17 1314    Visit Number 27   Number of Visits 33   Date for PT Re-Evaluation 08/20/17   Authorization Type Medicare & G-codes every 10th visit    PT Start Time 1105   PT Stop Time 1151   PT Time Calculation (min) 46 min   Equipment Utilized During Treatment Other (comment)  sock aide   Activity Tolerance Patient tolerated treatment well   Behavior During Therapy [facility] for tasks assessed/performed      Past Medical History:  Diagnosis Date  . Arthritis    "hips, shoulders; knees; back" (10/20/2016)  . Bicuspid aortic valve   . BPH (benign prostatic hypertrophy)   . Carpal tunnel syndrome of right wrist   . Coronary artery disease involving native coronary artery of native heart with unstable angina pectoris ([place]) 10/20/2016  . Diverticulitis   . GERD (gastroesophageal reflux disease)   . Gout   . Heart murmur   . Hyperlipemia   . Hypertension   . Postoperative atrial fibrillation ([place]) 10/24/2016  . RLS (restless legs syndrome)   . S/P aortic valve replacement with bioprosthetic valve 10/22/2016   25 mm [facility] Ease bovine pericardial bioprosthetic tissue valve  . S/P ascending aortic aneurysm repair 10/22/2016   28 mm supracoronary straight graft replacement of ascending thoracic aortic aneurysm  . S/P CABG x 3 10/22/2016   Sequential LIMA to Diag and LAD, SVG to distal LAD, open vein harvest right thigh  . S/P mitral valve repair 10/22/2016   Artificial Gore-tex neochord placement x6 - posterior annuloplasty band placed but removed due to systolic anterior motion of mitral valve  . Severe aortic stenosis    . Snores    Never been tested for sleep apnea  . Thoracic ascending aortic aneurysm ([place]) 10/21/2016  . Thrombocytopenia ([place]) 10/22/2016  . Type II diabetes mellitus ([place])     Past Surgical History:  Procedure Laterality Date  . AMPUTATION Bilateral 12/11/2016   Procedure: AMPUTATION BELOW KNEE BILATERALLY;  Surgeon: [name], MD;  Location: [facility];  Service: Orthopedics;  Laterality: Bilateral;  . AMPUTATION Bilateral 12/11/2016   Procedure: AMPUTATION BILATERAL HANDS EXCEPT RIGHT THUMB;  Surgeon: [name], MD;  Location: [facility];  Service: Orthopedics;  Laterality: Bilateral;  . AORTIC VALVE REPLACEMENT N/A 10/22/2016   Procedure: AORTIC VALVE REPLACEMENT (AVR) WITH SIZE 25 MM MAGNA EASE PERICARDIAL BIOPROSTHESIS - AORTIC;  Surgeon: Rexene Alberts, MD;  Location: [facility];  Service: Open Heart Surgery;  Laterality: N/A;  . BONE EXCISION Right 02/01/2017   Procedure: EXCISION RIGHT INDEX METACARPAL HEAD;  Surgeon: [name], MD;  Location: [facility];  Service: Orthopedics;  Laterality: Right;  . CARDIAC CATHETERIZATION N/A 10/20/2016   Procedure: Right/Left Heart Cath and Coronary Angiography;  Surgeon: Leonie Man, MD;  Location: [facility];  Service: Cardiovascular;  Laterality: N/A;  . CARPAL TUNNEL RELEASE Right 11/28/2013   Procedure: RIGHT WRIST CARPAL TUNNEL RELEASE;  Surgeon: Lorn Junes, MD;  Location: [facility];  Service: Orthopedics;  Laterality: Right;  . CATARACT EXTRACTION W/ INTRAOCULAR LENS  IMPLANT, BILATERAL Bilateral 1978  . COLONOSCOPY    .  CORONARY ARTERY BYPASS GRAFT N/A 10/22/2016   Procedure: CORONARY ARTERY BYPASS GRAFTING (CABG)x 2 WITH LIMA TO DIAGONAL, OPEN  HARVESTING OF RIGHT SAPHENOUS VEIN FOR VEIN GRAFT TO LAD;  Surgeon: Rexene Alberts, MD;  Location: [facility];  Service: Open Heart Surgery;  Laterality: N/A;  . FRACTURE SURGERY    . INGUINAL HERNIA REPAIR Right 1998  . IR GENERIC HISTORICAL  12/07/2016   IR US GUIDE VASC  ACCESS RIGHT 12/07/2016 Aletta Edouard, MD [facility]  . IR GENERIC HISTORICAL  12/07/2016   IR RADIOLOGY PERIPHERAL GUIDED IV START 12/07/2016 Aletta Edouard, MD [facility]  . IR GENERIC HISTORICAL  12/07/2016   IR GASTROSTOMY TUBE MOD SED 12/07/2016 Aletta Edouard, MD [facility]  . LIPOMA EXCISION Right 2008   "side of my head"  . MITRAL VALVE REPAIR N/A 10/22/2016   Procedure: MITRAL VALVE REPAIR (MVR) WITH SIZE 30 SORIN ANNULOFLEX ANNULOPLASTY RING WITH SUBSEQUENT REMOVAL OF RING;  Surgeon: Rexene Alberts, MD;  Location: [facility];  Service: Open Heart Surgery;  Laterality: N/A;  . PENECTOMY  2007   Peyronie's disease   . PENILE PROSTHESIS IMPLANT  2009  . REMOVAL OF PENILE PROSTHESIS N/A 02/01/2017   Procedure: REMOVAL OF PENILE PROSTHESIS;  Surgeon: Kathie Rhodes, MD;  Location: [facility];  Service: Urology;  Laterality: N/A;  . SHOULDER OPEN ROTATOR CUFF REPAIR Right 2006  . STERNAL CLOSURE N/A 10/26/2016   Procedure: STERNAL WASHOUT AND DELAYED PRIMARY CLOSURE;  Surgeon: Rexene Alberts, MD;  Location: [place];  Service: Thoracic;  Laterality: N/A;  . TEE WITHOUT CARDIOVERSION N/A 10/26/2016   Procedure: TRANSESOPHAGEAL ECHOCARDIOGRAM (TEE);  Surgeon: Rexene Alberts, MD;  Location: [facility];  Service: Thoracic;  Laterality: N/A;  . TEE WITHOUT CARDIOVERSION N/A 10/22/2016   Procedure: TRANSESOPHAGEAL ECHOCARDIOGRAM (TEE);  Surgeon: Rexene Alberts, MD;  Location: [facility];  Service: Open Heart Surgery;  Laterality: N/A;  . THORACIC AORTIC ANEURYSM REPAIR  10/22/2016   Procedure: ASCENDING AORTIC  ANEURYSM REPAIR (AAA) WITH 28 MM HEMASHIELD PLATINUM WOVEN DOUBLE VELOUR VASCULAR GRAFT;  Surgeon: Rexene Alberts, MD;  Location: [facility];  Service: Open Heart Surgery;;  . TONSILLECTOMY  ~ 1955  . TRACHEOSTOMY TUBE PLACEMENT N/A 11/09/2016   Procedure: TRACHEOSTOMY;  Surgeon: Rexene Alberts, MD;  Location: [facility];  Service: Thoracic;  Laterality: N/A;  . TRANSURETHRAL RESECTION OF PROSTATE  2005  .  TRIGGER FINGER RELEASE Bilateral    several lt and rt hands  . TRIGGER FINGER RELEASE Right 11/28/2013   Procedure: RIGHT TRIGGER FINGER  RELEASE (TENDON SHEATH INCISION);  Surgeon: Lorn Junes, MD;  Location: [facility];  Service: Orthopedics;  Laterality: Right;  Marland Kitchen VIDEO BRONCHOSCOPY N/A 11/09/2016   Procedure: VIDEO BRONCHOSCOPY;  Surgeon: Rexene Alberts, MD;  Location: [facility];  Service: Thoracic;  Laterality: N/A;  . WRIST FRACTURE SURGERY Right ~ 1959    There were no vitals filed for this visit.      Subjective Assessment - 07/30/17 1105    Subjective Has an appt on Monday to have UE prosthesis cable fixed and have LE aligned.  No issues to report.  Unable to figure out how to don socks with borrowed sock aid.  Wanting to focus on that today.   Patient is accompained by: Family member   Pertinent History arthritis, bicuspid aortic valve, BPH, carpal tunnel syndrome in R wrist, CAD, diverticulitis, GERD, gout, hyperlipidemia, HTN, DM type II,thrombocytopenia, thoracic ascending aortic aneurysm, severe aortic  stenosis, amputation of bilateral hands except right thumb (12/11/2016) due to gangrene, bilateral transtibial amputations (12/11/2016) due to gangrene, R and L heart cath (10/20/2016)    Patient Stated Goals use prostheses to walk including stairs, outdoors, target shooting, drive   Currently in Pain? No/denies                         [facility] Adult PT Treatment/Exercise - 07/30/17 1307      Therapeutic Activites    Therapeutic Activities ADL's   ADL's Reviewed and problem solved most effective method for donning shrink sock onto residual limb at night to allow pt to be fully independent with donning and doffing.  Trialed use of two types of sock aides-metal aide with handles and plastic aide with rope for pulling seated at edge of mat.  Most effective and efficient method includes: removing sock from limb in the morning over the sock aide so that the  sock is inside out; when donning pt begins by placing inside of sock against tip of limb and then gradually rolling/pulling sock up limb.  Pt required extra time to perform and at times was unable to pinch edge of sock with thumb.  Pt may benefit from small loops sewn onto edge of sock for leverage when pulling.  Pt to take home both types of sock aides and continue to perform technique with each type to determine most effecient aide for home use. Pt and wife encouraged with pt's progress with donning/doffing today.                PT Education - 07/30/17 1313    Education provided Yes   Education Details donning/doffing shrinker sock with 2 types of sock aides; removal of prosthesis before elevating LE when resting; removal of liner during rest periods to allow skin to dry if perspiring   Person(s) Educated Patient;Spouse   Methods Explanation;Demonstration;Tactile cues;Verbal cues   Comprehension Need further instruction          PT Short Term Goals - 07/21/17 1949      PT SHORT TERM GOAL #1   Title Patient tolerates bil. LE prostheses wear >75% of awake hours without skin issues.    Baseline wound on RLE healed but still using Tegaderm as protection.    Time 1   Period Months   Status Partially Met     PT SHORT TERM GOAL #2   Title Patient performs sit to/from stand from chairs without armrests without UE support to stablize modified independent.    Baseline MET 07/21/2017   Time 1   Period Months   Status Achieved     PT SHORT TERM GOAL #3   Title Patient reaches 10" anteriorly & within 5" of floor without UE support with supervision.    Baseline MET 07/20/2017   Time 1   Period Months   Status Achieved     PT SHORT TERM GOAL #4   Title Patient ambulates 300' with single UE support or less with minA.    Baseline MET 07/21/2017   Time 1   Period Months   Status Achieved     PT SHORT TERM GOAL #5   Title Patient negotiates ramps & curbs with cane or less with bil.  prostheses with minA.    Baseline MET 07/21/17   Time 1   Period Months   Status Achieved           PT Long Term Goals -  06/21/17 2106      PT LONG TERM GOAL #1   Title Patient demonstrates & verbalizes understanding of proper prosthetic care including independent donning to enable safe use of LE prostheses.    Time 2   Period Months   Status Revised   Target Date 08/20/17     PT LONG TERM GOAL #2   Title Patient will tolerate prostheses wear >90% of awake hours without skin issues or limb pain to demonstrate a decrease in his risk of falling.   Time 2   Period Months   Status On-going   Target Date 08/20/17     PT LONG TERM GOAL #3   Title Patient performs standing balance activities with intermittent UE support reaching 10", picking up objects from floor and looks over shoulders with weight shift, trunk rotation with prostheses independently.   Time 4   Period Weeks   Status On-going   Target Date 08/20/17     PT LONG TERM GOAL #4   Title Patient will ambulate 1000 feet including outdoor surfaces with LRAD and prostheses modified independently to enable community mobility.     Time 4   Period Months   Status On-going   Target Date 08/20/17     PT LONG TERM GOAL #5   Title Patient will negotiate ramp/curbs and stairs with LRAD and prostheses modified independent for community access.    Time 4   Period Months   Status On-going   Target Date 08/20/17     PT LONG TERM GOAL #6   Title Patient's gait velocity will be >/= 1.7f/s to indicate a limited community ambulator. (Target Date: 08/20/2017)   Time 4   Period Weeks   Status On-going     PT LONG TERM GOAL #7   Title Patient reports Activities of Balance Confindence score using FOTO >25% to indicate greater confidence in his balance. (Target Date: 08/20/2017)   Time 4   Period Months   Status On-going               Plan - 07/30/17 1314    Clinical Impression Statement Full treatment session today  focused on problem solving most efficient method and use of sock aides to don shrinker socks in the evening.  Pt this weekend to continue to practice doffing sock onto sock aide in the am and donning with sock already applied to aide inside out in the pm.  Will continue to assess most effective method and will continue to progress.   Rehab Potential Good   Clinical Impairments Affecting Rehab Potential arthritis, bicuspid aortic valve, BPH, carpal tunnel syndrome in R wrist, CAD, diverticulitis, GERD, gout, hyperlipidemia, HTN, DM type II,thrombocytopenia, thoracic ascending aortic aneurysm, severe aortic stenosis, amputation of bilateral hands except right thumb (12/11/2016) due to gangrene, bilateral transtibial amputations (12/11/2016) due to gangrene, R and L heart cath (10/20/2016)    PT Frequency 2x / week   PT Duration Other (comment)  60 days (9 weeks)   PT Treatment/Interventions ADLs/Self Care Home Management;Neuromuscular re-education;Balance training;Therapeutic exercise;Therapeutic activities;Functional mobility training;Stair training;Gait training;DME Instruction;Patient/family education;Prosthetic Training   PT Next Visit Plan review method with sock aide to don shrinker sock; work towards [place]  Ambulation on a variety of surfaces, stairs with cane for community & no UE device for household   Consulted and Agree with Plan of Care Patient;Family member/caregiver   Family Member Consulted spouse      Patient will benefit from skilled therapeutic intervention in order to improve  the following deficits and impairments:  Abnormal gait, Decreased activity tolerance, Decreased balance, Decreased coordination, Decreased range of motion, Decreased mobility, Decreased knowledge of use of DME, Decreased knowledge of precautions, Decreased endurance, Decreased skin integrity, Decreased scar mobility, Decreased strength, Difficulty walking, Postural dysfunction, Prosthetic Dependency  Visit  Diagnosis: Unsteadiness on feet  Muscle weakness (generalized)     Problem List Patient Active Problem List   Diagnosis Date Noted  . Amputation of both hands with complication, subsequent encounter 03/01/2017  . Diabetes ([place]) 02/19/2017  . Status post bilateral below knee amputation ([place]) 02/09/2017  . Wound disruption, post-op, skin, sequela 02/09/2017  . Debility 02/05/2017  . Ganglion upper arm, right   . Thrombosis of both upper extremities   . Suspected heparin induced thrombocytopenia (HIT) in hospitalized patient ([place])   . Gangrene of lower extremity ([place])   . Heparin induced thrombocytopenia (HIT) ([place])   . Tracheostomy status ([place])   . Chest tube in place   . Atherosclerosis of native arteries of extremities with gangrene, left leg ([place])   . Atherosclerosis of native arteries of extremities with gangrene, right leg ([place])   . Acute on chronic diastolic CHF (congestive heart failure) ([place])   . Enteritis due to Clostridium difficile   . Anasarca   . FUO (fever of unknown origin)   . Acute encephalopathy   . Elevated LFTs   . DIC (disseminated intravascular coagulation) ([place]) 10/28/2016  . Postoperative atrial fibrillation ([place]) 10/24/2016  . Cardiogenic shock ([place])   . Mitral regurgitation due to cusp prolapse 10/22/2016  . S/P aortic valve replacement with bioprosthetic valve 10/22/2016  . S/P ascending aortic aneurysm repair 10/22/2016  . S/P mitral valve repair 10/22/2016  . S/P CABG x 3 10/22/2016  . Thrombocytopenia ([place]) 10/22/2016  . Acute combined systolic and diastolic congestive heart failure ([place]) 10/22/2016  . Acute respiratory failure ([place]) 10/22/2016  . Thoracic ascending aortic aneurysm ([place]) 10/21/2016  . Coronary artery disease involving native coronary artery of native heart with unstable angina pectoris ([place]) 10/20/2016  . Severe aortic stenosis by prior echocardiography 10/19/2016  . Unstable angina ([place]) 10/19/2016  . Aortic valve stenosis  10/19/2016  . Bicuspid aortic valve 10/19/2016  . Trigger ring finger of right hand   . Wears glasses   . Gout   . Hypertension   . Carpal tunnel syndrome of right wrist   . Arthritis   . Snores     Raylene Everts, [place], DPT 07/30/17    1:18 PM    [facility] [address] [place], [place], [address] Phone: [phone]   Fax:  [phone]  Name: Scott [name] MRN: [id number] Date of Birth: [birth year]

## 2017-08-02 ENCOUNTER — Ambulatory Visit (INDEPENDENT_AMBULATORY_CARE_PROVIDER_SITE_OTHER): Payer: Medicare Other | Admitting: Orthopedic Surgery

## 2017-08-02 ENCOUNTER — Ambulatory Visit: Payer: Medicare Other | Admitting: Physical Therapy

## 2017-08-02 ENCOUNTER — Encounter: Payer: Self-pay | Admitting: Physical Therapy

## 2017-08-02 DIAGNOSIS — R29898 Other symptoms and signs involving the musculoskeletal system: Secondary | ICD-10-CM | POA: Diagnosis not present

## 2017-08-02 DIAGNOSIS — R2681 Unsteadiness on feet: Secondary | ICD-10-CM

## 2017-08-02 DIAGNOSIS — M6281 Muscle weakness (generalized): Secondary | ICD-10-CM

## 2017-08-02 DIAGNOSIS — R293 Abnormal posture: Secondary | ICD-10-CM

## 2017-08-02 DIAGNOSIS — R208 Other disturbances of skin sensation: Secondary | ICD-10-CM | POA: Diagnosis not present

## 2017-08-02 DIAGNOSIS — R2689 Other abnormalities of gait and mobility: Secondary | ICD-10-CM | POA: Diagnosis not present

## 2017-08-02 NOTE — Therapy (Signed)
[facility] [address] [place], [place], [address] Phone: [phone]   Fax:  [phone]  Physical Therapy Treatment  Patient Details  Name: KARLTON MAYA MRN: [id number] Date of Birth: 18-Jan-1946 Referring Provider: Meridee Score MD   Encounter Date: 08/02/2017      PT End of Session - 08/02/17 2146    Visit Number 28   Number of Visits 33   Date for PT Re-Evaluation 08/20/17   Authorization Type Medicare & G-codes every 10th visit    PT Start Time 1228   PT Stop Time 1315   PT Time Calculation (min) 47 min   Equipment Utilized During Treatment Other (comment)  sock aide   Activity Tolerance Patient tolerated treatment well   Behavior During Therapy [facility] for tasks assessed/performed      Past Medical History:  Diagnosis Date  . Arthritis    "hips, shoulders; knees; back" (10/20/2016)  . Bicuspid aortic valve   . BPH (benign prostatic hypertrophy)   . Carpal tunnel syndrome of right wrist   . Coronary artery disease involving native coronary artery of native heart with unstable angina pectoris ([place]) 10/20/2016  . Diverticulitis   . GERD (gastroesophageal reflux disease)   . Gout   . Heart murmur   . Hyperlipemia   . Hypertension   . Postoperative atrial fibrillation ([place]) 10/24/2016  . RLS (restless legs syndrome)   . S/P aortic valve replacement with bioprosthetic valve 10/22/2016   25 mm [facility] Ease bovine pericardial bioprosthetic tissue valve  . S/P ascending aortic aneurysm repair 10/22/2016   28 mm supracoronary straight graft replacement of ascending thoracic aortic aneurysm  . S/P CABG x 3 10/22/2016   Sequential LIMA to Diag and LAD, SVG to distal LAD, open vein harvest right thigh  . S/P mitral valve repair 10/22/2016   Artificial Gore-tex neochord placement x6 - posterior annuloplasty band placed but removed due to systolic anterior motion of mitral valve  . Severe aortic stenosis    . Snores    Never been tested for sleep apnea  . Thoracic ascending aortic aneurysm ([place]) 10/21/2016  . Thrombocytopenia ([place]) 10/22/2016  . Type II diabetes mellitus ([place])     Past Surgical History:  Procedure Laterality Date  . AMPUTATION Bilateral 12/11/2016   Procedure: AMPUTATION BELOW KNEE BILATERALLY;  Surgeon: [name], MD;  Location: [place];  Service: Orthopedics;  Laterality: Bilateral;  . AMPUTATION Bilateral 12/11/2016   Procedure: AMPUTATION BILATERAL HANDS EXCEPT RIGHT THUMB;  Surgeon: [name], MD;  Location: [place];  Service: Orthopedics;  Laterality: Bilateral;  . AORTIC VALVE REPLACEMENT N/A 10/22/2016   Procedure: AORTIC VALVE REPLACEMENT (AVR) WITH SIZE 25 MM MAGNA EASE PERICARDIAL BIOPROSTHESIS - AORTIC;  Surgeon: Rexene Alberts, MD;  Location: [place];  Service: Open Heart Surgery;  Laterality: N/A;  . BONE EXCISION Right 02/01/2017   Procedure: EXCISION RIGHT INDEX METACARPAL HEAD;  Surgeon: [name], MD;  Location: [place];  Service: Orthopedics;  Laterality: Right;  . CARDIAC CATHETERIZATION N/A 10/20/2016   Procedure: Right/Left Heart Cath and Coronary Angiography;  Surgeon: Leonie Man, MD;  Location: [facility] [facility];  Service: Cardiovascular;  Laterality: N/A;  . CARPAL TUNNEL RELEASE Right 11/28/2013   Procedure: RIGHT WRIST CARPAL TUNNEL RELEASE;  Surgeon: Lorn Junes, MD;  Location: [place];  Service: Orthopedics;  Laterality: Right;  . CATARACT EXTRACTION W/ INTRAOCULAR LENS  IMPLANT, BILATERAL Bilateral 1978  . COLONOSCOPY    .  CORONARY ARTERY BYPASS GRAFT N/A 10/22/2016   Procedure: CORONARY ARTERY BYPASS GRAFTING (CABG)x 2 WITH LIMA TO DIAGONAL, OPEN  HARVESTING OF RIGHT SAPHENOUS VEIN FOR VEIN GRAFT TO LAD;  Surgeon: Rexene Alberts, MD;  Location: [facility];  Service: Open Heart Surgery;  Laterality: N/A;  . FRACTURE SURGERY    . INGUINAL HERNIA REPAIR Right 1998  . IR GENERIC HISTORICAL  12/07/2016   IR US GUIDE VASC  ACCESS RIGHT 12/07/2016 Aletta Edouard, MD [facility]  . IR GENERIC HISTORICAL  12/07/2016   IR RADIOLOGY PERIPHERAL GUIDED IV START 12/07/2016 Aletta Edouard, MD [facility]  . IR GENERIC HISTORICAL  12/07/2016   IR GASTROSTOMY TUBE MOD SED 12/07/2016 Aletta Edouard, MD [facility]  . LIPOMA EXCISION Right 2008   "side of my head"  . MITRAL VALVE REPAIR N/A 10/22/2016   Procedure: MITRAL VALVE REPAIR (MVR) WITH SIZE 30 SORIN ANNULOFLEX ANNULOPLASTY RING WITH SUBSEQUENT REMOVAL OF RING;  Surgeon: Rexene Alberts, MD;  Location: [place];  Service: Open Heart Surgery;  Laterality: N/A;  . PENECTOMY  2007   Peyronie's disease   . PENILE PROSTHESIS IMPLANT  2009  . REMOVAL OF PENILE PROSTHESIS N/A 02/01/2017   Procedure: REMOVAL OF PENILE PROSTHESIS;  Surgeon: Kathie Rhodes, MD;  Location: [place];  Service: Urology;  Laterality: N/A;  . SHOULDER OPEN ROTATOR CUFF REPAIR Right 2006  . STERNAL CLOSURE N/A 10/26/2016   Procedure: STERNAL WASHOUT AND DELAYED PRIMARY CLOSURE;  Surgeon: Rexene Alberts, MD;  Location: [place];  Service: Thoracic;  Laterality: N/A;  . TEE WITHOUT CARDIOVERSION N/A 10/26/2016   Procedure: TRANSESOPHAGEAL ECHOCARDIOGRAM (TEE);  Surgeon: Rexene Alberts, MD;  Location: [place];  Service: Thoracic;  Laterality: N/A;  . TEE WITHOUT CARDIOVERSION N/A 10/22/2016   Procedure: TRANSESOPHAGEAL ECHOCARDIOGRAM (TEE);  Surgeon: Rexene Alberts, MD;  Location: [facility];  Service: Open Heart Surgery;  Laterality: N/A;  . THORACIC AORTIC ANEURYSM REPAIR  10/22/2016   Procedure: ASCENDING AORTIC  ANEURYSM REPAIR (AAA) WITH 28 MM HEMASHIELD PLATINUM WOVEN DOUBLE VELOUR VASCULAR GRAFT;  Surgeon: Rexene Alberts, MD;  Location: [facility];  Service: Open Heart Surgery;;  . TONSILLECTOMY  ~ 1955  . TRACHEOSTOMY TUBE PLACEMENT N/A 11/09/2016   Procedure: TRACHEOSTOMY;  Surgeon: Rexene Alberts, MD;  Location: [facility];  Service: Thoracic;  Laterality: N/A;  . TRANSURETHRAL RESECTION OF PROSTATE  2005  .  TRIGGER FINGER RELEASE Bilateral    several lt and rt hands  . TRIGGER FINGER RELEASE Right 11/28/2013   Procedure: RIGHT TRIGGER FINGER  RELEASE (TENDON SHEATH INCISION);  Surgeon: Lorn Junes, MD;  Location: [facility];  Service: Orthopedics;  Laterality: Right;  Marland Kitchen VIDEO BRONCHOSCOPY N/A 11/09/2016   Procedure: VIDEO BRONCHOSCOPY;  Surgeon: Rexene Alberts, MD;  Location: [facility];  Service: Thoracic;  Laterality: N/A;  . WRIST FRACTURE SURGERY Right ~ 1959    There were no vitals filed for this visit.      Subjective Assessment - 08/02/17 1227    Subjective His wounds are almost healed. He feels the metal sock aid is better.    Patient is accompained by: Family member   Pertinent History arthritis, bicuspid aortic valve, BPH, carpal tunnel syndrome in R wrist, CAD, diverticulitis, GERD, gout, hyperlipidemia, HTN, DM type II,thrombocytopenia, thoracic ascending aortic aneurysm, severe aortic stenosis, amputation of bilateral hands except right thumb (12/11/2016) due to gangrene, bilateral transtibial amputations (12/11/2016) due to gangrene, R and L heart cath (10/20/2016)  Limitations Standing;Walking;House hold activities   Patient Stated Goals use prostheses to walk including stairs, outdoors, target shooting, drive   Currently in Pain? No/denies                         [facility] Adult PT Treatment/Exercise - 08/02/17 1230      Transfers   Transfers Sit to Stand;Stand to Sit   Sit to Stand 5: Supervision;With upper extremity assist;From chair/3-in-1  no UE touch to stabilize   Stand to Sit 5: Supervision;With upper extremity assist     Ambulation/Gait   Ambulation/Gait Yes   Ambulation/Gait Assistance 4: Min guard   Ambulation/Gait Assistance Details verbal & tactile cues on step width, pelvic orientation & upright posture   Ambulation Distance (Feet) 250 Feet  250' X 2    Assistive device Prostheses;None  BLE TTA prostheses, BUE prostheses    Ambulation Surface Indoor;Level     High Level Balance   High Level Balance Activities Side stepping;Backward walking;Head turns;Negotitating around obstacles  slolam course, 4-square stepping   High Level Balance Comments manual & verbal cues on weight shift, upright posture & balance reaction  4-square stepping counter posterior & 3 chairbacks later/ant     Self-Care   Self-Care ADL's   ADL's attempted donning shrinkers with BUE prostheses but appears would risk scratching LE residual limbs so will continue to persue sock aides.      Prosthetics   Prosthetic Care Comments  Fall risk when awake without prostheses wear.    Current prosthetic wear tolerance (days/week)  daily   Current prosthetic wear tolerance (#hours/day)  reports all awake hours but removes prostheses, leaving liners on limbs for 30 min when uncomfortable.                  PT Short Term Goals - 07/21/17 1949      PT SHORT TERM GOAL #1   Title Patient tolerates bil. LE prostheses wear >75% of awake hours without skin issues.    Baseline wound on RLE healed but still using Tegaderm as protection.    Time 1   Period Months   Status Partially Met     PT SHORT TERM GOAL #2   Title Patient performs sit to/from stand from chairs without armrests without UE support to stablize modified independent.    Baseline MET 07/21/2017   Time 1   Period Months   Status Achieved     PT SHORT TERM GOAL #3   Title Patient reaches 10" anteriorly & within 5" of floor without UE support with supervision.    Baseline MET 07/20/2017   Time 1   Period Months   Status Achieved     PT SHORT TERM GOAL #4   Title Patient ambulates 300' with single UE support or less with minA.    Baseline MET 07/21/2017   Time 1   Period Months   Status Achieved     PT SHORT TERM GOAL #5   Title Patient negotiates ramps & curbs with cane or less with bil. prostheses with minA.    Baseline MET 07/21/17   Time 1   Period Months   Status  Achieved           PT Long Term Goals - 06/21/17 2106      PT LONG TERM GOAL #1   Title Patient demonstrates & verbalizes understanding of proper prosthetic care including independent donning to enable safe use of LE prostheses.  Time 2   Period Months   Status Revised   Target Date 08/20/17     PT LONG TERM GOAL #2   Title Patient will tolerate prostheses wear >90% of awake hours without skin issues or limb pain to demonstrate a decrease in his risk of falling.   Time 2   Period Months   Status On-going   Target Date 08/20/17     PT LONG TERM GOAL #3   Title Patient performs standing balance activities with intermittent UE support reaching 10", picking up objects from floor and looks over shoulders with weight shift, trunk rotation with prostheses independently.   Time 4   Period Weeks   Status On-going   Target Date 08/20/17     PT LONG TERM GOAL #4   Title Patient will ambulate 1000 feet including outdoor surfaces with LRAD and prostheses modified independently to enable community mobility.     Time 4   Period Months   Status On-going   Target Date 08/20/17     PT LONG TERM GOAL #5   Title Patient will negotiate ramp/curbs and stairs with LRAD and prostheses modified independent for community access.    Time 4   Period Months   Status On-going   Target Date 08/20/17     PT LONG TERM GOAL #6   Title Patient's gait velocity will be >/= 1.70f/s to indicate a limited community ambulator. (Target Date: 08/20/2017)   Time 4   Period Weeks   Status On-going     PT LONG TERM GOAL #7   Title Patient reports Activities of Balance Confindence score using FOTO >25% to indicate greater confidence in his balance. (Target Date: 08/20/2017)   Time 4   Period Months   Status On-going               Plan - 08/02/17 2146    Clinical Impression Statement Today's skilled session focused on gait & balance with protheses only, no AD. Patient improved ability to move  right, left & backwards with skilled instructions. Patient needs more practice with 4-square activity before performing at home with family.    Rehab Potential Good   Clinical Impairments Affecting Rehab Potential arthritis, bicuspid aortic valve, BPH, carpal tunnel syndrome in R wrist, CAD, diverticulitis, GERD, gout, hyperlipidemia, HTN, DM type II,thrombocytopenia, thoracic ascending aortic aneurysm, severe aortic stenosis, amputation of bilateral hands except right thumb (12/11/2016) due to gangrene, bilateral transtibial amputations (12/11/2016) due to gangrene, R and L heart cath (10/20/2016)    PT Frequency 2x / week   PT Duration Other (comment)  60 days (9 weeks)   PT Treatment/Interventions ADLs/Self Care Home Management;Neuromuscular re-education;Balance training;Therapeutic exercise;Therapeutic activities;Functional mobility training;Stair training;Gait training;DME Instruction;Patient/family education;Prosthetic Training   PT Next Visit Plan Prosthetic gait without AD on a variety of surfaces, stairs, ramps, curbs   Consulted and Agree with Plan of Care Patient;Family member/caregiver   Family Member Consulted dtr      Patient will benefit from skilled therapeutic intervention in order to improve the following deficits and impairments:  Abnormal gait, Decreased activity tolerance, Decreased balance, Decreased coordination, Decreased range of motion, Decreased mobility, Decreased knowledge of use of DME, Decreased knowledge of precautions, Decreased endurance, Decreased skin integrity, Decreased scar mobility, Decreased strength, Difficulty walking, Postural dysfunction, Prosthetic Dependency  Visit Diagnosis: Unsteadiness on feet  Muscle weakness (generalized)  Other abnormalities of gait and mobility  Other symptoms and signs involving the musculoskeletal system  Abnormal posture     Problem  List Patient Active Problem List   Diagnosis Date Noted  . Amputation of both  hands with complication, subsequent encounter 03/01/2017  . Diabetes ([place]) 02/19/2017  . Status post bilateral below knee amputation ([place]) 02/09/2017  . Wound disruption, post-op, skin, sequela 02/09/2017  . Debility 02/05/2017  . Ganglion upper arm, right   . Thrombosis of both upper extremities   . Suspected heparin induced thrombocytopenia (HIT) in hospitalized patient ([place])   . Gangrene of lower extremity ([place])   . Heparin induced thrombocytopenia (HIT) ([place])   . Tracheostomy status ([place])   . Chest tube in place   . Atherosclerosis of native arteries of extremities with gangrene, left leg ([place])   . Atherosclerosis of native arteries of extremities with gangrene, right leg ([place])   . Acute on chronic diastolic CHF (congestive heart failure) ([place])   . Enteritis due to Clostridium difficile   . Anasarca   . FUO (fever of unknown origin)   . Acute encephalopathy   . Elevated LFTs   . DIC (disseminated intravascular coagulation) ([place]) 10/28/2016  . Postoperative atrial fibrillation ([place]) 10/24/2016  . Cardiogenic shock ([place])   . Mitral regurgitation due to cusp prolapse 10/22/2016  . S/P aortic valve replacement with bioprosthetic valve 10/22/2016  . S/P ascending aortic aneurysm repair 10/22/2016  . S/P mitral valve repair 10/22/2016  . S/P CABG x 3 10/22/2016  . Thrombocytopenia ([place]) 10/22/2016  . Acute combined systolic and diastolic congestive heart failure ([place]) 10/22/2016  . Acute respiratory failure ([place]) 10/22/2016  . Thoracic ascending aortic aneurysm ([place]) 10/21/2016  . Coronary artery disease involving native coronary artery of native heart with unstable angina pectoris ([place]) 10/20/2016  . Severe aortic stenosis by prior echocardiography 10/19/2016  . Unstable angina ([place]) 10/19/2016  . Aortic valve stenosis 10/19/2016  . Bicuspid aortic valve 10/19/2016  . Trigger ring finger of right hand   . Wears glasses   . Gout   . Hypertension   . Carpal tunnel syndrome of  right wrist   . Arthritis   . Snores     Dalaina Tates PT, DPT 08/02/2017, 9:50 PM  [facility] [address] [place], [place], [address] Phone: [phone]   Fax:  [phone]  Name: CID AGENA MRN: [id number] Date of Birth: 09/26/1946

## 2017-08-04 ENCOUNTER — Ambulatory Visit: Payer: Medicare Other | Admitting: Physical Therapy

## 2017-08-04 DIAGNOSIS — M6281 Muscle weakness (generalized): Secondary | ICD-10-CM | POA: Diagnosis not present

## 2017-08-04 DIAGNOSIS — R2681 Unsteadiness on feet: Secondary | ICD-10-CM

## 2017-08-04 DIAGNOSIS — R29898 Other symptoms and signs involving the musculoskeletal system: Secondary | ICD-10-CM

## 2017-08-04 DIAGNOSIS — R2689 Other abnormalities of gait and mobility: Secondary | ICD-10-CM

## 2017-08-04 DIAGNOSIS — R208 Other disturbances of skin sensation: Secondary | ICD-10-CM | POA: Diagnosis not present

## 2017-08-04 DIAGNOSIS — R293 Abnormal posture: Secondary | ICD-10-CM | POA: Diagnosis not present

## 2017-08-05 NOTE — Therapy (Signed)
[facility] [address] [place], [place], [address] Phone: [phone]   Fax:  [phone]  Physical Therapy Treatment  Patient Details  Name: Scott [name] MRN: [id number] Date of Birth: May 06, 1946 Referring Provider: Meridee Score MD   Encounter Date: 08/04/2017      PT End of Session - 08/04/17 1106    Visit Number 29   Number of Visits 33   Date for PT Re-Evaluation 08/20/17   Authorization Type Medicare & G-codes every 10th visit    PT Start Time 1104   PT Stop Time 1145   PT Time Calculation (min) 41 min   Equipment Utilized During Treatment Gait belt   Activity Tolerance Patient tolerated treatment well   Behavior During Therapy [facility] for tasks assessed/performed      Past Medical History:  Diagnosis Date  . Arthritis    "hips, shoulders; knees; back" (10/20/2016)  . Bicuspid aortic valve   . BPH (benign prostatic hypertrophy)   . Carpal tunnel syndrome of right wrist   . Coronary artery disease involving native coronary artery of native heart with unstable angina pectoris ([place]) 10/20/2016  . Diverticulitis   . GERD (gastroesophageal reflux disease)   . Gout   . Heart murmur   . Hyperlipemia   . Hypertension   . Postoperative atrial fibrillation ([place]) 10/24/2016  . RLS (restless legs syndrome)   . S/P aortic valve replacement with bioprosthetic valve 10/22/2016   25 mm [facility] Ease bovine pericardial bioprosthetic tissue valve  . S/P ascending aortic aneurysm repair 10/22/2016   28 mm supracoronary straight graft replacement of ascending thoracic aortic aneurysm  . S/P CABG x 3 10/22/2016   Sequential LIMA to Diag and LAD, SVG to distal LAD, open vein harvest right thigh  . S/P mitral valve repair 10/22/2016   Artificial Gore-tex neochord placement x6 - posterior annuloplasty band placed but removed due to systolic anterior motion of mitral valve  . Severe aortic stenosis   . Snores    Never been tested for sleep apnea  . Thoracic ascending aortic aneurysm ([place]) 10/21/2016  . Thrombocytopenia ([place]) 10/22/2016  . Type II diabetes mellitus ([place])     Past Surgical History:  Procedure Laterality Date  . AMPUTATION Bilateral 12/11/2016   Procedure: AMPUTATION BELOW KNEE BILATERALLY;  Surgeon: [name], MD;  Location: [place];  Service: Orthopedics;  Laterality: Bilateral;  . AMPUTATION Bilateral 12/11/2016   Procedure: AMPUTATION BILATERAL HANDS EXCEPT RIGHT THUMB;  Surgeon: [name], MD;  Location: [place];  Service: Orthopedics;  Laterality: Bilateral;  . AORTIC VALVE REPLACEMENT N/A 10/22/2016   Procedure: AORTIC VALVE REPLACEMENT (AVR) WITH SIZE 25 MM MAGNA EASE PERICARDIAL BIOPROSTHESIS - AORTIC;  Surgeon: Rexene Alberts, MD;  Location: [place];  Service: Open Heart Surgery;  Laterality: N/A;  . BONE EXCISION Right 02/01/2017   Procedure: EXCISION RIGHT INDEX METACARPAL HEAD;  Surgeon: [name], MD;  Location: [place];  Service: Orthopedics;  Laterality: Right;  . CARDIAC CATHETERIZATION N/A 10/20/2016   Procedure: Right/Left Heart Cath and Coronary Angiography;  Surgeon: Leonie Man, MD;  Location: [place];  Service: Cardiovascular;  Laterality: N/A;  . CARPAL TUNNEL RELEASE Right 11/28/2013   Procedure: RIGHT WRIST CARPAL TUNNEL RELEASE;  Surgeon: Lorn Junes, MD;  Location: [place];  Service: Orthopedics;  Laterality: Right;  . CATARACT EXTRACTION W/ INTRAOCULAR LENS  IMPLANT, BILATERAL Bilateral 1978  . COLONOSCOPY    .  CORONARY ARTERY BYPASS GRAFT N/A 10/22/2016   Procedure: CORONARY ARTERY BYPASS GRAFTING (CABG)x 2 WITH LIMA TO DIAGONAL, OPEN  HARVESTING OF RIGHT SAPHENOUS VEIN FOR VEIN GRAFT TO LAD;  Surgeon: Rexene Alberts, MD;  Location: [place];  Service: Open Heart Surgery;  Laterality: N/A;  . FRACTURE SURGERY    . INGUINAL HERNIA REPAIR Right 1998  . IR GENERIC HISTORICAL  12/07/2016   IR US GUIDE VASC ACCESS RIGHT  12/07/2016 Aletta Edouard, MD [facility]  . IR GENERIC HISTORICAL  12/07/2016   IR RADIOLOGY PERIPHERAL GUIDED IV START 12/07/2016 Aletta Edouard, MD [facility]  . IR GENERIC HISTORICAL  12/07/2016   IR GASTROSTOMY TUBE MOD SED 12/07/2016 Aletta Edouard, MD [facility]  . LIPOMA EXCISION Right 2008   "side of my head"  . MITRAL VALVE REPAIR N/A 10/22/2016   Procedure: MITRAL VALVE REPAIR (MVR) WITH SIZE 30 SORIN ANNULOFLEX ANNULOPLASTY RING WITH SUBSEQUENT REMOVAL OF RING;  Surgeon: Rexene Alberts, MD;  Location: [place];  Service: Open Heart Surgery;  Laterality: N/A;  . PENECTOMY  2007   Peyronie's disease   . PENILE PROSTHESIS IMPLANT  2009  . REMOVAL OF PENILE PROSTHESIS N/A 02/01/2017   Procedure: REMOVAL OF PENILE PROSTHESIS;  Surgeon: Kathie Rhodes, MD;  Location: [place];  Service: Urology;  Laterality: N/A;  . SHOULDER OPEN ROTATOR CUFF REPAIR Right 2006  . STERNAL CLOSURE N/A 10/26/2016   Procedure: STERNAL WASHOUT AND DELAYED PRIMARY CLOSURE;  Surgeon: Rexene Alberts, MD;  Location: [place];  Service: Thoracic;  Laterality: N/A;  . TEE WITHOUT CARDIOVERSION N/A 10/26/2016   Procedure: TRANSESOPHAGEAL ECHOCARDIOGRAM (TEE);  Surgeon: Rexene Alberts, MD;  Location: [place];  Service: Thoracic;  Laterality: N/A;  . TEE WITHOUT CARDIOVERSION N/A 10/22/2016   Procedure: TRANSESOPHAGEAL ECHOCARDIOGRAM (TEE);  Surgeon: Rexene Alberts, MD;  Location: [place];  Service: Open Heart Surgery;  Laterality: N/A;  . THORACIC AORTIC ANEURYSM REPAIR  10/22/2016   Procedure: ASCENDING AORTIC  ANEURYSM REPAIR (AAA) WITH 28 MM HEMASHIELD PLATINUM WOVEN DOUBLE VELOUR VASCULAR GRAFT;  Surgeon: Rexene Alberts, MD;  Location: [place];  Service: Open Heart Surgery;;  . TONSILLECTOMY  ~ 1955  . TRACHEOSTOMY TUBE PLACEMENT N/A 11/09/2016   Procedure: TRACHEOSTOMY;  Surgeon: Rexene Alberts, MD;  Location: [place];  Service: Thoracic;  Laterality: N/A;  . TRANSURETHRAL RESECTION OF PROSTATE  2005  . TRIGGER FINGER  RELEASE Bilateral    several lt and rt hands  . TRIGGER FINGER RELEASE Right 11/28/2013   Procedure: RIGHT TRIGGER FINGER  RELEASE (TENDON SHEATH INCISION);  Surgeon: Lorn Junes, MD;  Location: [place];  Service: Orthopedics;  Laterality: Right;  Marland Kitchen VIDEO BRONCHOSCOPY N/A 11/09/2016   Procedure: VIDEO BRONCHOSCOPY;  Surgeon: Rexene Alberts, MD;  Location: [place];  Service: Thoracic;  Laterality: N/A;  . WRIST FRACTURE SURGERY Right ~ 1959    There were no vitals filed for this visit.      Subjective Assessment - 08/04/17 1106    Subjective No new complaints. No falls or pain to report.    Patient is accompained by: Family member  spouse   Pertinent History arthritis, bicuspid aortic valve, BPH, carpal tunnel syndrome in R wrist, CAD, diverticulitis, GERD, gout, hyperlipidemia, HTN, DM type II,thrombocytopenia, thoracic ascending aortic aneurysm, severe aortic stenosis, amputation of bilateral hands except right thumb (12/11/2016) due to gangrene, bilateral transtibial amputations (12/11/2016) due to gangrene, R and L heart cath (10/20/2016)  Limitations Standing;Walking;House hold activities   Patient Stated Goals use prostheses to walk including stairs, outdoors, target shooting, drive   Currently in Pain? No/denies   Pain Score 0-No pain           [facility] Adult PT Treatment/Exercise - 08/04/17 1107      Transfers   Transfers Sit to Stand;Stand to Sit   Sit to Stand 5: Supervision;With upper extremity assist;From chair/3-in-1   Stand to Sit 5: Supervision;With upper extremity assist     Ambulation/Gait   Ambulation/Gait Yes   Ambulation/Gait Assistance 4: Min guard   Ambulation/Gait Assistance Details multimodal cues for posture, more narrowed base of support and step length with gait.    Ambulation Distance (Feet) 125 Feet   Assistive device Prostheses;None   Gait Pattern Step-through pattern;Decreased arm swing - left;Decreased step length -  right;Decreased stride length;Trunk rotated posteriorly on right   Ambulation Surface Level;Indoor     High Level Balance   High Level Balance Activities Side stepping;Backward walking;Marching forwards   High Level Balance Comments in parallel bars for safety with no UE support x 4-5 laps each way with min guard to min assist for balance. cues on posture, base of support, weight shifitng to assist with balance as well.                    Neuro Re-ed    Neuro Re-ed Details  4 square steppping with bands as markers on floor: chairs on sides and in front with counter top behind pt. performed 6-8 laps each direction with min guard to min assist for balance. sent home bands with pt to practice at home when he has someone with him that can help with balance when needed.                           Prosthetics   Current prosthetic wear tolerance (days/week)  daily   Current prosthetic wear tolerance (#hours/day)  reports all awake hours but removes prostheses, leaving liners on limbs for 30 min when uncomfortable.   Residual limb condition  pt reports all wounds are healed with no issues.    Donning Prosthesis Modified independent (device/increased time)   Doffing Prosthesis Modified independent (device/increased time)             PT Short Term Goals - 07/21/17 1949      PT SHORT TERM GOAL #1   Title Patient tolerates bil. LE prostheses wear >75% of awake hours without skin issues.    Baseline wound on RLE healed but still using Tegaderm as protection.    Time 1   Period Months   Status Partially Met     PT SHORT TERM GOAL #2   Title Patient performs sit to/from stand from chairs without armrests without UE support to stablize modified independent.    Baseline MET 07/21/2017   Time 1   Period Months   Status Achieved     PT SHORT TERM GOAL #3   Title Patient reaches 10" anteriorly & within 5" of floor without UE support with supervision.    Baseline MET 07/20/2017   Time 1   Period  Months   Status Achieved     PT SHORT TERM GOAL #4   Title Patient ambulates 300' with single UE support or less with minA.    Baseline MET 07/21/2017   Time 1   Period Months   Status Achieved  PT SHORT TERM GOAL #5   Title Patient negotiates ramps & curbs with cane or less with bil. prostheses with minA.    Baseline MET 07/21/17   Time 1   Period Months   Status Achieved           PT Long Term Goals - 06/21/17 2106      PT LONG TERM GOAL #1   Title Patient demonstrates & verbalizes understanding of proper prosthetic care including independent donning to enable safe use of LE prostheses.    Time 2   Period Months   Status Revised   Target Date 08/20/17     PT LONG TERM GOAL #2   Title Patient will tolerate prostheses wear >90% of awake hours without skin issues or limb pain to demonstrate a decrease in his risk of falling.   Time 2   Period Months   Status On-going   Target Date 08/20/17     PT LONG TERM GOAL #3   Title Patient performs standing balance activities with intermittent UE support reaching 10", picking up objects from floor and looks over shoulders with weight shift, trunk rotation with prostheses independently.   Time 4   Period Weeks   Status On-going   Target Date 08/20/17     PT LONG TERM GOAL #4   Title Patient will ambulate 1000 feet including outdoor surfaces with LRAD and prostheses modified independently to enable community mobility.     Time 4   Period Months   Status On-going   Target Date 08/20/17     PT LONG TERM GOAL #5   Title Patient will negotiate ramp/curbs and stairs with LRAD and prostheses modified independent for community access.    Time 4   Period Months   Status On-going   Target Date 08/20/17     PT LONG TERM GOAL #6   Title Patient's gait velocity will be >/= 1.95f/s to indicate a limited community ambulator. (Target Date: 08/20/2017)   Time 4   Period Weeks   Status On-going     PT LONG TERM GOAL #7   Title  Patient reports Activities of Balance Confindence score using FOTO >25% to indicate greater confidence in his balance. (Target Date: 08/20/2017)   Time 4   Period Months   Status On-going            Plan - 08/04/17 1107    Clinical Impression Statement Today's skilled session continued to address gait with prostheses only and high level balance activities. Pt is making steady progress toward goals and should benefit from continued PT to progress toward unmet goals.    Rehab Potential Good   Clinical Impairments Affecting Rehab Potential arthritis, bicuspid aortic valve, BPH, carpal tunnel syndrome in R wrist, CAD, diverticulitis, GERD, gout, hyperlipidemia, HTN, DM type II,thrombocytopenia, thoracic ascending aortic aneurysm, severe aortic stenosis, amputation of bilateral hands except right thumb (12/11/2016) due to gangrene, bilateral transtibial amputations (12/11/2016) due to gangrene, R and L heart cath (10/20/2016)    PT Frequency 2x / week   PT Duration Other (comment)  60 days (9 weeks)   PT Treatment/Interventions ADLs/Self Care Home Management;Neuromuscular re-education;Balance training;Therapeutic exercise;Therapeutic activities;Functional mobility training;Stair training;Gait training;DME Instruction;Patient/family education;Prosthetic Training   PT Next Visit Plan G-code next visit; Prosthetic gait without AD on a variety of surfaces, stairs, ramps, curbs;balance    Consulted and Agree with Plan of Care Patient;Family member/caregiver   Family Member Consulted dtr      Patient will benefit from  skilled therapeutic intervention in order to improve the following deficits and impairments:  Abnormal gait, Decreased activity tolerance, Decreased balance, Decreased coordination, Decreased range of motion, Decreased mobility, Decreased knowledge of use of DME, Decreased knowledge of precautions, Decreased endurance, Decreased skin integrity, Decreased scar mobility, Decreased strength,  Difficulty walking, Postural dysfunction, Prosthetic Dependency  Visit Diagnosis: Unsteadiness on feet  Muscle weakness (generalized)  Other abnormalities of gait and mobility  Other symptoms and signs involving the musculoskeletal system     Problem List Patient Active Problem List   Diagnosis Date Noted  . Amputation of both hands with complication, subsequent encounter 03/01/2017  . Diabetes ([place]) 02/19/2017  . Status post bilateral below knee amputation ([place]) 02/09/2017  . Wound disruption, post-op, skin, sequela 02/09/2017  . Debility 02/05/2017  . Ganglion upper arm, right   . Thrombosis of both upper extremities   . Suspected heparin induced thrombocytopenia (HIT) in hospitalized patient ([place])   . Gangrene of lower extremity ([place])   . Heparin induced thrombocytopenia (HIT) ([place])   . Tracheostomy status ([place])   . Chest tube in place   . Atherosclerosis of native arteries of extremities with gangrene, left leg ([place])   . Atherosclerosis of native arteries of extremities with gangrene, right leg ([place])   . Acute on chronic diastolic CHF (congestive heart failure) ([place])   . Enteritis due to Clostridium difficile   . Anasarca   . FUO (fever of unknown origin)   . Acute encephalopathy   . Elevated LFTs   . DIC (disseminated intravascular coagulation) ([place]) 10/28/2016  . Postoperative atrial fibrillation ([place]) 10/24/2016  . Cardiogenic shock ([place])   . Mitral regurgitation due to cusp prolapse 10/22/2016  . S/P aortic valve replacement with bioprosthetic valve 10/22/2016  . S/P ascending aortic aneurysm repair 10/22/2016  . S/P mitral valve repair 10/22/2016  . S/P CABG x 3 10/22/2016  . Thrombocytopenia ([place]) 10/22/2016  . Acute combined systolic and diastolic congestive heart failure ([place]) 10/22/2016  . Acute respiratory failure ([place]) 10/22/2016  . Thoracic ascending aortic aneurysm ([place]) 10/21/2016  . Coronary artery disease involving native coronary artery of native  heart with unstable angina pectoris ([place]) 10/20/2016  . Severe aortic stenosis by prior echocardiography 10/19/2016  . Unstable angina ([place]) 10/19/2016  . Aortic valve stenosis 10/19/2016  . Bicuspid aortic valve 10/19/2016  . Trigger ring finger of right hand   . Wears glasses   . Gout   . Hypertension   . Carpal tunnel syndrome of right wrist   . Arthritis   . Snores     [place], [place], [facility] [address], [place], [place] [address] [phone] 08/05/17, 4:27 PM   Name: LAKYN MANTIONE MRN: [id number] Date of Birth: 10/08/1946

## 2017-08-09 ENCOUNTER — Ambulatory Visit: Payer: Medicare Other | Admitting: Physical Therapy

## 2017-08-11 ENCOUNTER — Encounter: Payer: Self-pay | Admitting: Physical Therapy

## 2017-08-11 ENCOUNTER — Ambulatory Visit: Payer: Medicare Other | Admitting: Physical Therapy

## 2017-08-11 DIAGNOSIS — M6281 Muscle weakness (generalized): Secondary | ICD-10-CM | POA: Diagnosis not present

## 2017-08-11 DIAGNOSIS — R2689 Other abnormalities of gait and mobility: Secondary | ICD-10-CM | POA: Diagnosis not present

## 2017-08-11 DIAGNOSIS — R208 Other disturbances of skin sensation: Secondary | ICD-10-CM | POA: Diagnosis not present

## 2017-08-11 DIAGNOSIS — R2681 Unsteadiness on feet: Secondary | ICD-10-CM

## 2017-08-11 DIAGNOSIS — R29898 Other symptoms and signs involving the musculoskeletal system: Secondary | ICD-10-CM | POA: Diagnosis not present

## 2017-08-11 DIAGNOSIS — R293 Abnormal posture: Secondary | ICD-10-CM | POA: Diagnosis not present

## 2017-08-11 NOTE — Therapy (Addendum)
[facility] [address] [place], [place], [address] Phone: [phone]   Fax:  [phone]  Physical Therapy Treatment  Patient Details  Name: Scott [name] MRN: [id number] Date of Birth: [birth year] Referring Provider: Meridee Score MD   Encounter Date: 08/11/2017      PT End of Session - 08/11/17 1109    Visit Number 30   Number of Visits 33   Date for PT Re-Evaluation 08/20/17   Authorization Type Medicare & G-codes every 10th visit    PT Start Time 1102   PT Stop Time 1143   PT Time Calculation (min) 41 min   Equipment Utilized During Treatment Gait belt   Activity Tolerance Patient tolerated treatment well   Behavior During Therapy [facility] for tasks assessed/performed      Past Medical History:  Diagnosis Date  . Arthritis    "hips, shoulders; knees; back" (10/20/2016)  . Bicuspid aortic valve   . BPH (benign prostatic hypertrophy)   . Carpal tunnel syndrome of right wrist   . Coronary artery disease involving native coronary artery of native heart with unstable angina pectoris ([place]) 10/20/2016  . Diverticulitis   . GERD (gastroesophageal reflux disease)   . Gout   . Heart murmur   . Hyperlipemia   . Hypertension   . Postoperative atrial fibrillation ([place]) 10/24/2016  . RLS (restless legs syndrome)   . S/P aortic valve replacement with bioprosthetic valve 10/22/2016   25 mm [facility] Ease bovine pericardial bioprosthetic tissue valve  . S/P ascending aortic aneurysm repair 10/22/2016   28 mm supracoronary straight graft replacement of ascending thoracic aortic aneurysm  . S/P CABG x 3 10/22/2016   Sequential LIMA to Diag and LAD, SVG to distal LAD, open vein harvest right thigh  . S/P mitral valve repair 10/22/2016   Artificial Gore-tex neochord placement x6 - posterior annuloplasty band placed but removed due to systolic anterior motion of mitral valve  . Severe aortic stenosis   . Snores     Never been tested for sleep apnea  . Thoracic ascending aortic aneurysm ([place]) 10/21/2016  . Thrombocytopenia ([place]) 10/22/2016  . Type II diabetes mellitus ([place])     Past Surgical History:  Procedure Laterality Date  . AMPUTATION Bilateral 12/11/2016   Procedure: AMPUTATION BELOW KNEE BILATERALLY;  Surgeon: [name], MD;  Location: [facility];  Service: Orthopedics;  Laterality: Bilateral;  . AMPUTATION Bilateral 12/11/2016   Procedure: AMPUTATION BILATERAL HANDS EXCEPT RIGHT THUMB;  Surgeon: [name], MD;  Location: [facility];  Service: Orthopedics;  Laterality: Bilateral;  . AORTIC VALVE REPLACEMENT N/A 10/22/2016   Procedure: AORTIC VALVE REPLACEMENT (AVR) WITH SIZE 25 MM MAGNA EASE PERICARDIAL BIOPROSTHESIS - AORTIC;  Surgeon: Rexene Alberts, MD;  Location: [facility];  Service: Open Heart Surgery;  Laterality: N/A;  . BONE EXCISION Right 02/01/2017   Procedure: EXCISION RIGHT INDEX METACARPAL HEAD;  Surgeon: [name], MD;  Location: [facility];  Service: Orthopedics;  Laterality: Right;  . CARDIAC CATHETERIZATION N/A 10/20/2016   Procedure: Right/Left Heart Cath and Coronary Angiography;  Surgeon: Leonie Man, MD;  Location: [facility];  Service: Cardiovascular;  Laterality: N/A;  . CARPAL TUNNEL RELEASE Right 11/28/2013   Procedure: RIGHT WRIST CARPAL TUNNEL RELEASE;  Surgeon: Lorn Junes, MD;  Location: [facility];  Service: Orthopedics;  Laterality: Right;  . CATARACT EXTRACTION W/ INTRAOCULAR LENS  IMPLANT, BILATERAL Bilateral 1978  . COLONOSCOPY    .  CORONARY ARTERY BYPASS GRAFT N/A 10/22/2016   Procedure: CORONARY ARTERY BYPASS GRAFTING (CABG)x 2 WITH LIMA TO DIAGONAL, OPEN  HARVESTING OF RIGHT SAPHENOUS VEIN FOR VEIN GRAFT TO LAD;  Surgeon: Rexene Alberts, MD;  Location: [facility];  Service: Open Heart Surgery;  Laterality: N/A;  . FRACTURE SURGERY    . INGUINAL HERNIA REPAIR Right 1998  . IR GENERIC HISTORICAL  12/07/2016   IR US GUIDE VASC ACCESS RIGHT  12/07/2016 Aletta Edouard, MD [facility]  . IR GENERIC HISTORICAL  12/07/2016   IR RADIOLOGY PERIPHERAL GUIDED IV START 12/07/2016 Aletta Edouard, MD [facility]  . IR GENERIC HISTORICAL  12/07/2016   IR GASTROSTOMY TUBE MOD SED 12/07/2016 Aletta Edouard, MD [facility]  . LIPOMA EXCISION Right 2008   "side of my head"  . MITRAL VALVE REPAIR N/A 10/22/2016   Procedure: MITRAL VALVE REPAIR (MVR) WITH SIZE 30 SORIN ANNULOFLEX ANNULOPLASTY RING WITH SUBSEQUENT REMOVAL OF RING;  Surgeon: Rexene Alberts, MD;  Location: [facility];  Service: Open Heart Surgery;  Laterality: N/A;  . PENECTOMY  2007   Peyronie's disease   . PENILE PROSTHESIS IMPLANT  2009  . REMOVAL OF PENILE PROSTHESIS N/A 02/01/2017   Procedure: REMOVAL OF PENILE PROSTHESIS;  Surgeon: Kathie Rhodes, MD;  Location: [facility];  Service: Urology;  Laterality: N/A;  . SHOULDER OPEN ROTATOR CUFF REPAIR Right 2006  . STERNAL CLOSURE N/A 10/26/2016   Procedure: STERNAL WASHOUT AND DELAYED PRIMARY CLOSURE;  Surgeon: Rexene Alberts, MD;  Location: [facility];  Service: Thoracic;  Laterality: N/A;  . TEE WITHOUT CARDIOVERSION N/A 10/26/2016   Procedure: TRANSESOPHAGEAL ECHOCARDIOGRAM (TEE);  Surgeon: Rexene Alberts, MD;  Location: [facility];  Service: Thoracic;  Laterality: N/A;  . TEE WITHOUT CARDIOVERSION N/A 10/22/2016   Procedure: TRANSESOPHAGEAL ECHOCARDIOGRAM (TEE);  Surgeon: Rexene Alberts, MD;  Location: [facility];  Service: Open Heart Surgery;  Laterality: N/A;  . THORACIC AORTIC ANEURYSM REPAIR  10/22/2016   Procedure: ASCENDING AORTIC  ANEURYSM REPAIR (AAA) WITH 28 MM HEMASHIELD PLATINUM WOVEN DOUBLE VELOUR VASCULAR GRAFT;  Surgeon: Rexene Alberts, MD;  Location: [facility];  Service: Open Heart Surgery;;  . TONSILLECTOMY  ~ 1955  . TRACHEOSTOMY TUBE PLACEMENT N/A 11/09/2016   Procedure: TRACHEOSTOMY;  Surgeon: Rexene Alberts, MD;  Location: [facility];  Service: Thoracic;  Laterality: N/A;  . TRANSURETHRAL RESECTION OF PROSTATE  2005  . TRIGGER FINGER  RELEASE Bilateral    several lt and rt hands  . TRIGGER FINGER RELEASE Right 11/28/2013   Procedure: RIGHT TRIGGER FINGER  RELEASE (TENDON SHEATH INCISION);  Surgeon: Lorn Junes, MD;  Location: [facility];  Service: Orthopedics;  Laterality: Right;  Marland Kitchen VIDEO BRONCHOSCOPY N/A 11/09/2016   Procedure: VIDEO BRONCHOSCOPY;  Surgeon: Rexene Alberts, MD;  Location: [facility];  Service: Thoracic;  Laterality: N/A;  . WRIST FRACTURE SURGERY Right ~ 1959    There were no vitals filed for this visit.      Subjective Assessment - 08/11/17 1108    Subjective No new complaints. No falls or pain to report.    Patient is accompained by: Family member  spouse   Pertinent History arthritis, bicuspid aortic valve, BPH, carpal tunnel syndrome in R wrist, CAD, diverticulitis, GERD, gout, hyperlipidemia, HTN, DM type II,thrombocytopenia, thoracic ascending aortic aneurysm, severe aortic stenosis, amputation of bilateral hands except right thumb (12/11/2016) due to gangrene, bilateral transtibial amputations (12/11/2016) due to gangrene, R and L heart cath (10/20/2016)  Limitations Standing;Walking;House hold activities   Patient Stated Goals use prostheses to walk including stairs, outdoors, target shooting, drive   Currently in Pain? No/denies   Pain Score 0-No pain              [facility] Adult PT Treatment/Exercise - 08/11/17 1112      Transfers   Transfers Sit to Stand;Stand to Sit   Sit to Stand 5: Supervision;With upper extremity assist;From chair/3-in-1   Stand to Sit 5: Supervision;With upper extremity assist     Ambulation/Gait   Ambulation/Gait Yes   Ambulation/Gait Assistance 4: Min guard   Ambulation/Gait Assistance Details cues for posture, base of support and step length. pt carried cane, using 1-2 times for balance with turns mostly   Ambulation Distance (Feet) 110 Feet  x1, plus around gym with activities   Assistive device Prostheses;None   Gait Pattern Step-through  pattern;Decreased arm swing - left;Decreased step length - right;Decreased stride length;Trunk rotated posteriorly on right   Ambulation Surface Level;Indoor     High Level Balance   High Level Balance Comments in hallway (~50 foot): forward gait with head movements left<>fwd<>right, then up<>fwd<>down x 2 laps each with up min guard to min assist with cane use, min to mod assist with on AD. cues on posture, step length and base of support with gait.                                Neuro Re-ed    Neuro Re-ed Details  4 square steppping with bands as markers on floor: chairs on sides and in front with counter top behind pt. performed 6-8 laps each direction with min guard to min assist for balance. cues for increased step length/distance and for weight shifting, especially with backwards stepping; worked on blocked practice of fwd/bwd stepping over band to work on step length and weight shifting with UE support and min guard assist.                                     PT Short Term Goals - 07/21/17 1949      PT SHORT TERM GOAL #1   Title Patient tolerates bil. LE prostheses wear >75% of awake hours without skin issues.    Baseline wound on RLE healed but still using Tegaderm as protection.    Time 1   Period Months   Status Partially Met     PT SHORT TERM GOAL #2   Title Patient performs sit to/from stand from chairs without armrests without UE support to stablize modified independent.    Baseline MET 07/21/2017   Time 1   Period Months   Status Achieved     PT SHORT TERM GOAL #3   Title Patient reaches 10" anteriorly & within 5" of floor without UE support with supervision.    Baseline MET 07/20/2017   Time 1   Period Months   Status Achieved     PT SHORT TERM GOAL #4   Title Patient ambulates 300' with single UE support or less with minA.    Baseline MET 07/21/2017   Time 1   Period Months   Status Achieved     PT SHORT TERM GOAL #5   Title Patient negotiates ramps &  curbs with cane or less with bil. prostheses with minA.    Baseline MET 07/21/17  Time 1   Period Months   Status Achieved           PT Long Term Goals - 06/21/17 2106      PT LONG TERM GOAL #1   Title Patient demonstrates & verbalizes understanding of proper prosthetic care including independent donning to enable safe use of LE prostheses.    Time 2   Period Months   Status Revised   Target Date 08/20/17     PT LONG TERM GOAL #2   Title Patient will tolerate prostheses wear >90% of awake hours without skin issues or limb pain to demonstrate a decrease in his risk of falling.   Time 2   Period Months   Status On-going   Target Date 08/20/17     PT LONG TERM GOAL #3   Title Patient performs standing balance activities with intermittent UE support reaching 10", picking up objects from floor and looks over shoulders with weight shift, trunk rotation with prostheses independently.   Time 4   Period Weeks   Status On-going   Target Date 08/20/17     PT LONG TERM GOAL #4   Title Patient will ambulate 1000 feet including outdoor surfaces with LRAD and prostheses modified independently to enable community mobility.     Time 4   Period Months   Status On-going   Target Date 08/20/17     PT LONG TERM GOAL #5   Title Patient will negotiate ramp/curbs and stairs with LRAD and prostheses modified independent for community access.    Time 4   Period Months   Status On-going   Target Date 08/20/17     PT LONG TERM GOAL #6   Title Patient's gait velocity will be >/= 1.58f/s to indicate a limited community ambulator. (Target Date: 08/20/2017)   Time 4   Period Weeks   Status On-going     PT LONG TERM GOAL #7   Title Patient reports Activities of Balance Confindence score using FOTO >25% to indicate greater confidence in his balance. (Target Date: 08/20/2017)   Time 4   Period Months   Status On-going               Plan - 08/11/17 1110    Clinical Impression  Statement Today's skilled session continued to address gait with on AD and balance with multi-direction stepping. Pt has made significant improvement with lateral stepping both ways, contiinues to need up to min assist with cues for backward stepping. Pt is progressing toward goals and should benefit from continued PT to progress toward unmet goals.                                  Rehab Potential Good   Clinical Impairments Affecting Rehab Potential arthritis, bicuspid aortic valve, BPH, carpal tunnel syndrome in R wrist, CAD, diverticulitis, GERD, gout, hyperlipidemia, HTN, DM type II,thrombocytopenia, thoracic ascending aortic aneurysm, severe aortic stenosis, amputation of bilateral hands except right thumb (12/11/2016) due to gangrene, bilateral transtibial amputations (12/11/2016) due to gangrene, R and L heart cath (10/20/2016)    PT Frequency 2x / week   PT Duration Other (comment)  60 days (9 weeks)   PT Treatment/Interventions ADLs/Self Care Home Management;Neuromuscular re-education;Balance training;Therapeutic exercise;Therapeutic activities;Functional mobility training;Stair training;Gait training;DME Instruction;Patient/family education;Prosthetic Training   PT Next Visit Plan stairs next session to assess for pt being independent (spouse no with him) per pt/spouse request;  Prosthetic gait  without AD on a variety of surfaces, stairs, ramps, curbs;balance    Consulted and Agree with Plan of Care Patient;Family member/caregiver   Family Member Consulted dtr      Patient will benefit from skilled therapeutic intervention in order to improve the following deficits and impairments:  Abnormal gait, Decreased activity tolerance, Decreased balance, Decreased coordination, Decreased range of motion, Decreased mobility, Decreased knowledge of use of DME, Decreased knowledge of precautions, Decreased endurance, Decreased skin integrity, Decreased scar mobility, Decreased strength, Difficulty walking,  Postural dysfunction, Prosthetic Dependency  Visit Diagnosis: Unsteadiness on feet  Muscle weakness (generalized)  Other abnormalities of gait and mobility       G-Codes - 2017/08/18 1110    Functional Assessment Tool Used (Outpatient Only) wearing prostheses (bil LE's) all awake hours with 1 or 2 short breaks from prostheses with liners on only duirng day. all wounds are healed at this time.    Functional Limitation Self care      Problem List Patient Active Problem List   Diagnosis Date Noted  . Amputation of both hands with complication, subsequent encounter 03/01/2017  . Diabetes ([place]) 02/19/2017  . Status post bilateral below knee amputation ([place]) 02/09/2017  . Wound disruption, post-op, skin, sequela 02/09/2017  . Debility 02/05/2017  . Ganglion upper arm, right   . Thrombosis of both upper extremities   . Suspected heparin induced thrombocytopenia (HIT) in hospitalized patient ([place])   . Gangrene of lower extremity ([place])   . Heparin induced thrombocytopenia (HIT) ([place])   . Tracheostomy status ([place])   . Chest tube in place   . Atherosclerosis of native arteries of extremities with gangrene, left leg ([place])   . Atherosclerosis of native arteries of extremities with gangrene, right leg ([place])   . Acute on chronic diastolic CHF (congestive heart failure) ([place])   . Enteritis due to Clostridium difficile   . Anasarca   . FUO (fever of unknown origin)   . Acute encephalopathy   . Elevated LFTs   . DIC (disseminated intravascular coagulation) ([place]) 10/28/2016  . Postoperative atrial fibrillation ([place]) 10/24/2016  . Cardiogenic shock ([place])   . Mitral regurgitation due to cusp prolapse 10/22/2016  . S/P aortic valve replacement with bioprosthetic valve 10/22/2016  . S/P ascending aortic aneurysm repair 10/22/2016  . S/P mitral valve repair 10/22/2016  . S/P CABG x 3 10/22/2016  . Thrombocytopenia ([place]) 10/22/2016  . Acute combined systolic and diastolic congestive heart  failure ([place]) 10/22/2016  . Acute respiratory failure ([place]) 10/22/2016  . Thoracic ascending aortic aneurysm ([place]) 10/21/2016  . Coronary artery disease involving native coronary artery of native heart with unstable angina pectoris ([place]) 10/20/2016  . Severe aortic stenosis by prior echocardiography 10/19/2016  . Unstable angina ([place]) 10/19/2016  . Aortic valve stenosis 10/19/2016  . Bicuspid aortic valve 10/19/2016  . Trigger ring finger of right hand   . Wears glasses   . Gout   . Hypertension   . Carpal tunnel syndrome of right wrist   . Arthritis   . Snores     [place], [place] [address], [place], [place] [address] [phone] 2017-08-18, 11:18 PM   Name: Scott [name] MRN: [id number] Date of Birth: 08-Feb-1946    08/18/2017 1110  PT G-Codes  Functional Assessment Tool Used (Outpatient Only) wearing prostheses (bil LE's) all awake hours with 1 or 2 short breaks from prostheses with liners on only duirng day. all wounds are healed at this time.  Functional Limitation Self care  Mobility: Walking and Moving Around Current Status [phone]) CJ  Mobility: Walking and Moving Around Goal Status [phone]) CI   Physical Therapy Progress Note  Dates of Reporting Period: 07/05/17 to 08/11/17  Objective Reports of Subjective Statement: Patient & wife report increased activity & prostheses wear.   Objective Measurements: see above  Goal Update: see above  Plan: Continue established plan   Reason Skilled Services are Required: Patient requires skilled instruction to progress prosthetic mobility with bilateral Transtibial prostheses and amputation of all phalanges except dominant thumb.   Jamey Reas, PT, DPT PT Specializing in [place] 08/12/17 10:05 PM Phone:  [phone]  Fax:  [phone] [facility] [address] [place], [place] [address]

## 2017-08-12 ENCOUNTER — Ambulatory Visit: Payer: Medicare Other | Admitting: Physical Therapy

## 2017-08-12 ENCOUNTER — Encounter: Payer: Self-pay | Admitting: Physical Therapy

## 2017-08-12 DIAGNOSIS — M6281 Muscle weakness (generalized): Secondary | ICD-10-CM

## 2017-08-12 DIAGNOSIS — R29898 Other symptoms and signs involving the musculoskeletal system: Secondary | ICD-10-CM | POA: Diagnosis not present

## 2017-08-12 DIAGNOSIS — R2681 Unsteadiness on feet: Secondary | ICD-10-CM

## 2017-08-12 DIAGNOSIS — R2689 Other abnormalities of gait and mobility: Secondary | ICD-10-CM | POA: Diagnosis not present

## 2017-08-12 DIAGNOSIS — R208 Other disturbances of skin sensation: Secondary | ICD-10-CM | POA: Diagnosis not present

## 2017-08-12 DIAGNOSIS — R293 Abnormal posture: Secondary | ICD-10-CM | POA: Diagnosis not present

## 2017-08-12 NOTE — Therapy (Signed)
[facility] [address] [place] [facility] [place], [place], [address] Phone: [phone]   Fax:  [phone]  Physical Therapy Treatment  Patient Details  Name: Scott [name] MRN: [id number] Date of Birth: Feb 15, 1946 Referring Provider: Meridee Score MD   Encounter Date: 08/12/2017      PT End of Session - 08/12/17 1106    Visit Number 31   Number of Visits 33   Date for PT Re-Evaluation 08/20/17   Authorization Type Medicare & G-codes every 10th visit    PT Start Time 1102   PT Stop Time 1145   PT Time Calculation (min) 43 min   Equipment Utilized During Treatment Gait belt   Activity Tolerance Patient tolerated treatment well   Behavior During Therapy WFL for tasks assessed/performed      Past Medical History:  Diagnosis Date  . Arthritis    "hips, shoulders; knees; back" (10/20/2016)  . Bicuspid aortic valve   . BPH (benign prostatic hypertrophy)   . Carpal tunnel syndrome of right wrist   . Coronary artery disease involving native coronary artery of native heart with unstable angina pectoris ([place]) 10/20/2016  . Diverticulitis   . GERD (gastroesophageal reflux disease)   . Gout   . Heart murmur   . Hyperlipemia   . Hypertension   . Postoperative atrial fibrillation ([place]) 10/24/2016  . RLS (restless legs syndrome)   . S/P aortic valve replacement with bioprosthetic valve 10/22/2016   25 mm [facility] Ease bovine pericardial bioprosthetic tissue valve  . S/P ascending aortic aneurysm repair 10/22/2016   28 mm supracoronary straight graft replacement of ascending thoracic aortic aneurysm  . S/P CABG x 3 10/22/2016   Sequential LIMA to Diag and LAD, SVG to distal LAD, open vein harvest right thigh  . S/P mitral valve repair 10/22/2016   Artificial Gore-tex neochord placement x6 - posterior annuloplasty band placed but removed due to systolic anterior motion of mitral valve  . Severe aortic stenosis   . Snores    Never been tested for sleep apnea  . Thoracic ascending aortic aneurysm ([place]) 10/21/2016  . Thrombocytopenia ([place]) 10/22/2016  . Type II diabetes mellitus ([place])     Past Surgical History:  Procedure Laterality Date  . AMPUTATION Bilateral 12/11/2016   Procedure: AMPUTATION BELOW KNEE BILATERALLY;  Surgeon: [name], MD;  Location: [facility];  Service: Orthopedics;  Laterality: Bilateral;  . AMPUTATION Bilateral 12/11/2016   Procedure: AMPUTATION BILATERAL HANDS EXCEPT RIGHT THUMB;  Surgeon: [name], MD;  Location: [facility];  Service: Orthopedics;  Laterality: Bilateral;  . AORTIC VALVE REPLACEMENT N/A 10/22/2016   Procedure: AORTIC VALVE REPLACEMENT (AVR) WITH SIZE 25 MM MAGNA EASE PERICARDIAL BIOPROSTHESIS - AORTIC;  Surgeon: Rexene Alberts, MD;  Location: [facility];  Service: Open Heart Surgery;  Laterality: N/A;  . BONE EXCISION Right 02/01/2017   Procedure: EXCISION RIGHT INDEX METACARPAL HEAD;  Surgeon: [name], MD;  Location: [facility];  Service: Orthopedics;  Laterality: Right;  . CARDIAC CATHETERIZATION N/A 10/20/2016   Procedure: Right/Left Heart Cath and Coronary Angiography;  Surgeon: Leonie Man, MD;  Location: [facility];  Service: Cardiovascular;  Laterality: N/A;  . CARPAL TUNNEL RELEASE Right 11/28/2013   Procedure: RIGHT WRIST CARPAL TUNNEL RELEASE;  Surgeon: Lorn Junes, MD;  Location: [facility];  Service: Orthopedics;  Laterality: Right;  . CATARACT EXTRACTION W/ INTRAOCULAR LENS  IMPLANT, BILATERAL Bilateral 1978  . COLONOSCOPY    .  CORONARY ARTERY BYPASS GRAFT N/A 10/22/2016   Procedure: CORONARY ARTERY BYPASS GRAFTING (CABG)x 2 WITH LIMA TO DIAGONAL, OPEN  HARVESTING OF RIGHT SAPHENOUS VEIN FOR VEIN GRAFT TO LAD;  Surgeon: Rexene Alberts, MD;  Location: [facility];  Service: Open Heart Surgery;  Laterality: N/A;  . FRACTURE SURGERY    . INGUINAL HERNIA REPAIR Right 1998  . IR GENERIC HISTORICAL  12/07/2016   IR US GUIDE VASC ACCESS RIGHT  12/07/2016 Aletta Edouard, MD [facility]  . IR GENERIC HISTORICAL  12/07/2016   IR RADIOLOGY PERIPHERAL GUIDED IV START 12/07/2016 Aletta Edouard, MD [facility]  . IR GENERIC HISTORICAL  12/07/2016   IR GASTROSTOMY TUBE MOD SED 12/07/2016 Aletta Edouard, MD [facility]  . LIPOMA EXCISION Right 2008   "side of my head"  . MITRAL VALVE REPAIR N/A 10/22/2016   Procedure: MITRAL VALVE REPAIR (MVR) WITH SIZE 30 SORIN ANNULOFLEX ANNULOPLASTY RING WITH SUBSEQUENT REMOVAL OF RING;  Surgeon: Rexene Alberts, MD;  Location: [facility];  Service: Open Heart Surgery;  Laterality: N/A;  . PENECTOMY  2007   Peyronie's disease   . PENILE PROSTHESIS IMPLANT  2009  . REMOVAL OF PENILE PROSTHESIS N/A 02/01/2017   Procedure: REMOVAL OF PENILE PROSTHESIS;  Surgeon: Kathie Rhodes, MD;  Location: [facility];  Service: Urology;  Laterality: N/A;  . SHOULDER OPEN ROTATOR CUFF REPAIR Right 2006  . STERNAL CLOSURE N/A 10/26/2016   Procedure: STERNAL WASHOUT AND DELAYED PRIMARY CLOSURE;  Surgeon: Rexene Alberts, MD;  Location: [facility];  Service: Thoracic;  Laterality: N/A;  . TEE WITHOUT CARDIOVERSION N/A 10/26/2016   Procedure: TRANSESOPHAGEAL ECHOCARDIOGRAM (TEE);  Surgeon: Rexene Alberts, MD;  Location: [facility];  Service: Thoracic;  Laterality: N/A;  . TEE WITHOUT CARDIOVERSION N/A 10/22/2016   Procedure: TRANSESOPHAGEAL ECHOCARDIOGRAM (TEE);  Surgeon: Rexene Alberts, MD;  Location: [facility];  Service: Open Heart Surgery;  Laterality: N/A;  . THORACIC AORTIC ANEURYSM REPAIR  10/22/2016   Procedure: ASCENDING AORTIC  ANEURYSM REPAIR (AAA) WITH 28 MM HEMASHIELD PLATINUM WOVEN DOUBLE VELOUR VASCULAR GRAFT;  Surgeon: Rexene Alberts, MD;  Location: [facility];  Service: Open Heart Surgery;;  . TONSILLECTOMY  ~ 1955  . TRACHEOSTOMY TUBE PLACEMENT N/A 11/09/2016   Procedure: TRACHEOSTOMY;  Surgeon: Rexene Alberts, MD;  Location: [facility];  Service: Thoracic;  Laterality: N/A;  . TRANSURETHRAL RESECTION OF PROSTATE  2005  . TRIGGER FINGER  RELEASE Bilateral    several lt and rt hands  . TRIGGER FINGER RELEASE Right 11/28/2013   Procedure: RIGHT TRIGGER FINGER  RELEASE (TENDON SHEATH INCISION);  Surgeon: Lorn Junes, MD;  Location: [facility];  Service: Orthopedics;  Laterality: Right;  Marland Kitchen VIDEO BRONCHOSCOPY N/A 11/09/2016   Procedure: VIDEO BRONCHOSCOPY;  Surgeon: Rexene Alberts, MD;  Location: [facility];  Service: Thoracic;  Laterality: N/A;  . WRIST FRACTURE SURGERY Right ~ 1959    There were no vitals filed for this visit.      Subjective Assessment - 08/12/17 1105    Subjective No new complaints. No falls or pain to report.    Patient is accompained by: Family member  spouse   Pertinent History arthritis, bicuspid aortic valve, BPH, carpal tunnel syndrome in R wrist, CAD, diverticulitis, GERD, gout, hyperlipidemia, HTN, DM type II,thrombocytopenia, thoracic ascending aortic aneurysm, severe aortic stenosis, amputation of bilateral hands except right thumb (12/11/2016) due to gangrene, bilateral transtibial amputations (12/11/2016) due to gangrene, R and L heart cath (10/20/2016)  Limitations Standing;Walking;House hold activities   Patient Stated Goals use prostheses to walk including stairs, outdoors, target shooting, drive   Currently in Pain? No/denies   Pain Score 0-No pain            [facility] Adult PT Treatment/Exercise - 08/12/17 1110      Transfers   Transfers Sit to Stand;Stand to Sit   Sit to Stand 5: Supervision;With upper extremity assist;From chair/3-in-1   Stand to Sit 5: Supervision;With upper extremity assist     Ambulation/Gait   Ambulation/Gait Yes   Ambulation/Gait Assistance 4: Min guard;5: Supervision   Ambulation/Gait Assistance Details around gym for multiple activities with supervision when pt used cane, min guard to occasional min assist when pt carried cane for no AD with gaitl    Assistive device Prostheses;None   Gait Pattern Step-through pattern;Decreased arm swing -  left;Decreased step length - right;Decreased stride length;Trunk rotated posteriorly on right   Ambulation Surface Level;Indoor   Stairs Yes   Stairs Assistance 5: Supervision;6: Modified independent (Device/Increase time)   Stairs Assistance Details (indicate cue type and reason) blocked practice on stairs: used rail/cane to ascend fwd/single rail to descend sideways x 1 reps. was discussed that goal was to be independent and needed to figure out how he could safety get the cane down with using single rail. pt thought he could carry it down by strap on arm while holding onto rail. Discussed this would not be safe as he may step on the end of the cane creating a fall risk. On discussion was decided he could use cane down stairs and RW upstairs, with just using single rail to go up/down both stairs. blocked practice on up/down using single rail with pt progressing from min guard to distant supervision with no cues needed by end. Pt/spouse to try this at home with her providing distant supervision, to in the home, to pt on his own as they feel comfortable.                          Stair Management Technique One rail Left;Step to pattern;Sideways   Number of Stairs 4   Height of Stairs 6     High Level Balance   High Level Balance Activities Marching forwards;Backward walking   High Level Balance Comments in parallel bars: marching forward, backward walking x 3 laps with no UE support, min assist to occasional touch to bars to catch balance. cues on posture and weight shifitng to assist with balance.      Neuro Re-ed    Neuro Re-ed Details  in parallel bars: fwd/bwd stepping to/from airex<>floor x 9-10 reps with each foot leading, cues on step length and weight shifting to assist with balance. the with 2 foam bubbles: alternating fwd taps, progressing to alternating cross taps with min assist and intermittent touch to bars for balance.      Prosthetics   Current prosthetic wear tolerance (days/week)   daily   Current prosthetic wear tolerance (#hours/day)  reports all awake hours but removes prostheses, leaving liners on limbs for 30 min when uncomfortable.   Residual limb condition  pt reports all wounds are healed with no issues.    Education Provided Residual limb care;Proper wear schedule/adjustment;Proper weight-bearing schedule/adjustment   Person(s) Educated Patient;Spouse   Education Method Explanation;Verbal cues   Education Method Verbalized understanding;Needs further instruction;Verbal cues required   Donning Prosthesis Modified independent (device/increased time)   Doffing Prosthesis Modified independent (device/increased time)  PT Short Term Goals - 07/21/17 1949      PT SHORT TERM GOAL #1   Title Patient tolerates bil. LE prostheses wear >75% of awake hours without skin issues.    Baseline wound on RLE healed but still using Tegaderm as protection.    Time 1   Period Months   Status Partially Met     PT SHORT TERM GOAL #2   Title Patient performs sit to/from stand from chairs without armrests without UE support to stablize modified independent.    Baseline MET 07/21/2017   Time 1   Period Months   Status Achieved     PT SHORT TERM GOAL #3   Title Patient reaches 10" anteriorly & within 5" of floor without UE support with supervision.    Baseline MET 07/20/2017   Time 1   Period Months   Status Achieved     PT SHORT TERM GOAL #4   Title Patient ambulates 300' with single UE support or less with minA.    Baseline MET 07/21/2017   Time 1   Period Months   Status Achieved     PT SHORT TERM GOAL #5   Title Patient negotiates ramps & curbs with cane or less with bil. prostheses with minA.    Baseline MET 07/21/17   Time 1   Period Months   Status Achieved           PT Long Term Goals - 06/21/17 2106      PT LONG TERM GOAL #1   Title Patient demonstrates & verbalizes understanding of proper prosthetic care including independent donning  to enable safe use of LE prostheses.    Time 2   Period Months   Status Revised   Target Date 08/20/17     PT LONG TERM GOAL #2   Title Patient will tolerate prostheses wear >90% of awake hours without skin issues or limb pain to demonstrate a decrease in his risk of falling.   Time 2   Period Months   Status On-going   Target Date 08/20/17     PT LONG TERM GOAL #3   Title Patient performs standing balance activities with intermittent UE support reaching 10", picking up objects from floor and looks over shoulders with weight shift, trunk rotation with prostheses independently.   Time 4   Period Weeks   Status On-going   Target Date 08/20/17     PT LONG TERM GOAL #4   Title Patient will ambulate 1000 feet including outdoor surfaces with LRAD and prostheses modified independently to enable community mobility.     Time 4   Period Months   Status On-going   Target Date 08/20/17     PT LONG TERM GOAL #5   Title Patient will negotiate ramp/curbs and stairs with LRAD and prostheses modified independent for community access.    Time 4   Period Months   Status On-going   Target Date 08/20/17     PT LONG TERM GOAL #6   Title Patient's gait velocity will be >/= 1.11f/s to indicate a limited community ambulator. (Target Date: 08/20/2017)   Time 4   Period Weeks   Status On-going     PT LONG TERM GOAL #7   Title Patient reports Activities of Balance Confindence score using FOTO >25% to indicate greater confidence in his balance. (Target Date: 08/20/2017)   Time 4   Period Months   Status On-going  Plan - 08/12/17 1106    Clinical Impression Statement Today's skilled session focused on stairs and safe technique to allow pt to progress to being independent on stairs without spouse having to be with him. Best way was for pt to ascend/descend sideways with single rail. Remainder of session continued to address gait without AD and balance activities without issues  reported. Pt is progressing toward goals and should benefit from continued PT to progress toward unmet goals.                           Rehab Potential Good   Clinical Impairments Affecting Rehab Potential arthritis, bicuspid aortic valve, BPH, carpal tunnel syndrome in R wrist, CAD, diverticulitis, GERD, gout, hyperlipidemia, HTN, DM type II,thrombocytopenia, thoracic ascending aortic aneurysm, severe aortic stenosis, amputation of bilateral hands except right thumb (12/11/2016) due to gangrene, bilateral transtibial amputations (12/11/2016) due to gangrene, R and L heart cath (10/20/2016)    PT Frequency 2x / week   PT Duration Other (comment)  60 days (9 weeks)   PT Treatment/Interventions ADLs/Self Care Home Management;Neuromuscular re-education;Balance training;Therapeutic exercise;Therapeutic activities;Functional mobility training;Stair training;Gait training;DME Instruction;Patient/family education;Prosthetic Training   PT Next Visit Plan assess LTGs as plan of care ends at end of month (next week)   Consulted and Agree with Plan of Care Patient;Family member/caregiver   Family Member Consulted dtr      Patient will benefit from skilled therapeutic intervention in order to improve the following deficits and impairments:  Abnormal gait, Decreased activity tolerance, Decreased balance, Decreased coordination, Decreased range of motion, Decreased mobility, Decreased knowledge of use of DME, Decreased knowledge of precautions, Decreased endurance, Decreased skin integrity, Decreased scar mobility, Decreased strength, Difficulty walking, Postural dysfunction, Prosthetic Dependency  Visit Diagnosis: Unsteadiness on feet  Muscle weakness (generalized)  Other abnormalities of gait and mobility     Problem List Patient Active Problem List   Diagnosis Date Noted  . Amputation of both hands with complication, subsequent encounter 03/01/2017  . Diabetes ([place]) 02/19/2017  . Status post  bilateral below knee amputation ([place]) 02/09/2017  . Wound disruption, post-op, skin, sequela 02/09/2017  . Debility 02/05/2017  . Ganglion upper arm, right   . Thrombosis of both upper extremities   . Suspected heparin induced thrombocytopenia (HIT) in hospitalized patient ([place])   . Gangrene of lower extremity ([place])   . Heparin induced thrombocytopenia (HIT) ([place])   . Tracheostomy status ([place])   . Chest tube in place   . Atherosclerosis of native arteries of extremities with gangrene, left leg ([place])   . Atherosclerosis of native arteries of extremities with gangrene, right leg ([place])   . Acute on chronic diastolic CHF (congestive heart failure) ([name])   . Enteritis due to Clostridium difficile   . Anasarca   . FUO (fever of unknown origin)   . Acute encephalopathy   . Elevated LFTs   . DIC (disseminated intravascular coagulation) ([place]) 10/28/2016  . Postoperative atrial fibrillation ([place]) 10/24/2016  . Cardiogenic shock ([place])   . Mitral regurgitation due to cusp prolapse 10/22/2016  . S/P aortic valve replacement with bioprosthetic valve 10/22/2016  . S/P ascending aortic aneurysm repair 10/22/2016  . S/P mitral valve repair 10/22/2016  . S/P CABG x 3 10/22/2016  . Thrombocytopenia ([place]) 10/22/2016  . Acute combined systolic and diastolic congestive heart failure ([place]) 10/22/2016  . Acute respiratory failure ([place]) 10/22/2016  . Thoracic ascending aortic aneurysm ([place]) 10/21/2016  . Coronary artery disease involving  native coronary artery of native heart with unstable angina pectoris ([place]) 10/20/2016  . Severe aortic stenosis by prior echocardiography 10/19/2016  . Unstable angina ([place]) 10/19/2016  . Aortic valve stenosis 10/19/2016  . Bicuspid aortic valve 10/19/2016  . Trigger ring finger of right hand   . Wears glasses   . Gout   . Hypertension   . Carpal tunnel syndrome of right wrist   . Arthritis   . Snores     [place], [place], [facility] [address] [place], [place] [address] [phone] 08/12/17, 4:17 PM   Name: GABE GLACE MRN: [id number] Date of Birth: 04/22/1946

## 2017-08-13 ENCOUNTER — Telehealth: Payer: Self-pay | Admitting: Cardiology

## 2017-08-13 NOTE — Telephone Encounter (Signed)
Patient called in regards to having his Start amoxicillin 2,000 mg 40 min prior to dental procedure called in. States that his dental procedure is scheduled for Thursday. [facility]  [address]. [place]    [phone].

## 2017-08-13 NOTE — Telephone Encounter (Signed)
Pt says [facility] never received [facility] for [facility] - spoke with Dorothea Ogle at [facility] who says they would have rx ready for pt - pt made aware and appreciative

## 2017-08-16 ENCOUNTER — Encounter: Payer: Self-pay | Admitting: Physical Therapy

## 2017-08-16 ENCOUNTER — Ambulatory Visit: Payer: Medicare Other | Admitting: Physical Therapy

## 2017-08-16 DIAGNOSIS — R208 Other disturbances of skin sensation: Secondary | ICD-10-CM | POA: Diagnosis not present

## 2017-08-16 DIAGNOSIS — M6281 Muscle weakness (generalized): Secondary | ICD-10-CM

## 2017-08-16 DIAGNOSIS — R2689 Other abnormalities of gait and mobility: Secondary | ICD-10-CM

## 2017-08-16 DIAGNOSIS — R29898 Other symptoms and signs involving the musculoskeletal system: Secondary | ICD-10-CM | POA: Diagnosis not present

## 2017-08-16 DIAGNOSIS — R293 Abnormal posture: Secondary | ICD-10-CM | POA: Diagnosis not present

## 2017-08-16 DIAGNOSIS — R2681 Unsteadiness on feet: Secondary | ICD-10-CM | POA: Diagnosis not present

## 2017-08-16 NOTE — Therapy (Signed)
[facility] [address] [place], [place], [address] Phone: [phone]   Fax:  [phone]  Physical Therapy Treatment  Patient Details  Name: Scott [name] MRN: [id number] Date of Birth: 07-22-1946 Referring Provider: Meridee Score MD   Encounter Date: 08/16/2017      PT End of Session - 08/16/17 2209    Visit Number 32   Number of Visits 33   Date for PT Re-Evaluation 08/20/17   Authorization Type Medicare & G-codes every 10th visit    PT Start Time 1100   PT Stop Time 1150   PT Time Calculation (min) 50 min   Equipment Utilized During Treatment Gait belt   Activity Tolerance Patient tolerated treatment well   Behavior During Therapy [facility] for tasks assessed/performed      Past Medical History:  Diagnosis Date  . Arthritis    "hips, shoulders; knees; back" (10/20/2016)  . Bicuspid aortic valve   . BPH (benign prostatic hypertrophy)   . Carpal tunnel syndrome of right wrist   . Coronary artery disease involving native coronary artery of native heart with unstable angina pectoris ([place]) 10/20/2016  . Diverticulitis   . GERD (gastroesophageal reflux disease)   . Gout   . Heart murmur   . Hyperlipemia   . Hypertension   . Postoperative atrial fibrillation ([place]) 10/24/2016  . RLS (restless legs syndrome)   . S/P aortic valve replacement with bioprosthetic valve 10/22/2016   25 mm [facility] Ease bovine pericardial bioprosthetic tissue valve  . S/P ascending aortic aneurysm repair 10/22/2016   28 mm supracoronary straight graft replacement of ascending thoracic aortic aneurysm  . S/P CABG x 3 10/22/2016   Sequential LIMA to Diag and LAD, SVG to distal LAD, open vein harvest right thigh  . S/P mitral valve repair 10/22/2016   Artificial Gore-tex neochord placement x6 - posterior annuloplasty band placed but removed due to systolic anterior motion of mitral valve  . Severe aortic stenosis   . Snores    Never been tested for sleep apnea  . Thoracic ascending aortic aneurysm ([place]) 10/21/2016  . Thrombocytopenia ([place]) 10/22/2016  . Type II diabetes mellitus ([place])     Past Surgical History:  Procedure Laterality Date  . AMPUTATION Bilateral 12/11/2016   Procedure: AMPUTATION BELOW KNEE BILATERALLY;  Surgeon: [name], MD;  Location: [facility];  Service: Orthopedics;  Laterality: Bilateral;  . AMPUTATION Bilateral 12/11/2016   Procedure: AMPUTATION BILATERAL HANDS EXCEPT RIGHT THUMB;  Surgeon: [name], MD;  Location: [facility];  Service: Orthopedics;  Laterality: Bilateral;  . AORTIC VALVE REPLACEMENT N/A 10/22/2016   Procedure: AORTIC VALVE REPLACEMENT (AVR) WITH SIZE 25 MM MAGNA EASE PERICARDIAL BIOPROSTHESIS - AORTIC;  Surgeon: Rexene Alberts, MD;  Location: [facility];  Service: Open Heart Surgery;  Laterality: N/A;  . BONE EXCISION Right 02/01/2017   Procedure: EXCISION RIGHT INDEX METACARPAL HEAD;  Surgeon: [name], MD;  Location: [place];  Service: Orthopedics;  Laterality: Right;  . CARDIAC CATHETERIZATION N/A 10/20/2016   Procedure: Right/Left Heart Cath and Coronary Angiography;  Surgeon: Leonie Man, MD;  Location: [facility];  Service: Cardiovascular;  Laterality: N/A;  . CARPAL TUNNEL RELEASE Right 11/28/2013   Procedure: RIGHT WRIST CARPAL TUNNEL RELEASE;  Surgeon: Lorn Junes, MD;  Location: [facility];  Service: Orthopedics;  Laterality: Right;  . CATARACT EXTRACTION W/ INTRAOCULAR LENS  IMPLANT, BILATERAL Bilateral 1978  . COLONOSCOPY    .  CORONARY ARTERY BYPASS GRAFT N/A 10/22/2016   Procedure: CORONARY ARTERY BYPASS GRAFTING (CABG)x 2 WITH LIMA TO DIAGONAL, OPEN  HARVESTING OF RIGHT SAPHENOUS VEIN FOR VEIN GRAFT TO LAD;  Surgeon: Rexene Alberts, MD;  Location: [facility];  Service: Open Heart Surgery;  Laterality: N/A;  . FRACTURE SURGERY    . INGUINAL HERNIA REPAIR Right 1998  . IR GENERIC HISTORICAL  12/07/2016   IR US GUIDE VASC ACCESS RIGHT  12/07/2016 Aletta Edouard, MD [facility]  . IR GENERIC HISTORICAL  12/07/2016   IR RADIOLOGY PERIPHERAL GUIDED IV START 12/07/2016 Aletta Edouard, MD [facility]  . IR GENERIC HISTORICAL  12/07/2016   IR GASTROSTOMY TUBE MOD SED 12/07/2016 Aletta Edouard, MD [facility]  . LIPOMA EXCISION Right 2008   "side of my head"  . MITRAL VALVE REPAIR N/A 10/22/2016   Procedure: MITRAL VALVE REPAIR (MVR) WITH SIZE 30 SORIN ANNULOFLEX ANNULOPLASTY RING WITH SUBSEQUENT REMOVAL OF RING;  Surgeon: Rexene Alberts, MD;  Location: [facility];  Service: Open Heart Surgery;  Laterality: N/A;  . PENECTOMY  2007   Peyronie's disease   . PENILE PROSTHESIS IMPLANT  2009  . REMOVAL OF PENILE PROSTHESIS N/A 02/01/2017   Procedure: REMOVAL OF PENILE PROSTHESIS;  Surgeon: Kathie Rhodes, MD;  Location: [facility];  Service: Urology;  Laterality: N/A;  . SHOULDER OPEN ROTATOR CUFF REPAIR Right 2006  . STERNAL CLOSURE N/A 10/26/2016   Procedure: STERNAL WASHOUT AND DELAYED PRIMARY CLOSURE;  Surgeon: Rexene Alberts, MD;  Location: [facility];  Service: Thoracic;  Laterality: N/A;  . TEE WITHOUT CARDIOVERSION N/A 10/26/2016   Procedure: TRANSESOPHAGEAL ECHOCARDIOGRAM (TEE);  Surgeon: Rexene Alberts, MD;  Location: [facility];  Service: Thoracic;  Laterality: N/A;  . TEE WITHOUT CARDIOVERSION N/A 10/22/2016   Procedure: TRANSESOPHAGEAL ECHOCARDIOGRAM (TEE);  Surgeon: Rexene Alberts, MD;  Location: [facility];  Service: Open Heart Surgery;  Laterality: N/A;  . THORACIC AORTIC ANEURYSM REPAIR  10/22/2016   Procedure: ASCENDING AORTIC  ANEURYSM REPAIR (AAA) WITH 28 MM HEMASHIELD PLATINUM WOVEN DOUBLE VELOUR VASCULAR GRAFT;  Surgeon: Rexene Alberts, MD;  Location: [facility];  Service: Open Heart Surgery;;  . TONSILLECTOMY  ~ 1955  . TRACHEOSTOMY TUBE PLACEMENT N/A 11/09/2016   Procedure: TRACHEOSTOMY;  Surgeon: Rexene Alberts, MD;  Location: [facility];  Service: Thoracic;  Laterality: N/A;  . TRANSURETHRAL RESECTION OF PROSTATE  2005  . TRIGGER FINGER  RELEASE Bilateral    several lt and rt hands  . TRIGGER FINGER RELEASE Right 11/28/2013   Procedure: RIGHT TRIGGER FINGER  RELEASE (TENDON SHEATH INCISION);  Surgeon: Lorn Junes, MD;  Location: [facility];  Service: Orthopedics;  Laterality: Right;  Marland Kitchen VIDEO BRONCHOSCOPY N/A 11/09/2016   Procedure: VIDEO BRONCHOSCOPY;  Surgeon: Rexene Alberts, MD;  Location: [facility];  Service: Thoracic;  Laterality: N/A;  . WRIST FRACTURE SURGERY Right ~ 1959    There were no vitals filed for this visit.      Subjective Assessment - 08/16/17 1105    Subjective He works in garage some but limited grip. No falls.    Patient is accompained by: Family member   Pertinent History arthritis, bicuspid aortic valve, BPH, carpal tunnel syndrome in R wrist, CAD, diverticulitis, GERD, gout, hyperlipidemia, HTN, DM type II,thrombocytopenia, thoracic ascending aortic aneurysm, severe aortic stenosis, amputation of bilateral hands except right thumb (12/11/2016) due to gangrene, bilateral transtibial amputations (12/11/2016) due to gangrene, R and L heart cath (10/20/2016)    Limitations  Standing;Walking;House hold activities   Patient Stated Goals use prostheses to walk including stairs, outdoors, target shooting, drive   Currently in Pain? No/denies            [facility] PT Assessment - 08/16/17 1100      Ambulation/Gait   Gait velocity 2.18 ft/sec comfortable; 2.91 ft/sec fast pace                     [facility] Adult PT Treatment/Exercise - 08/16/17 1100      Transfers   Transfers Sit to Stand;Stand to Sit;Floor to Transfer   Sit to Stand 6: Modified independent (Device/Increase time);5: Supervision;4: Min assist;With upper extremity assist;From chair/3-in-1  from garden kneel bench   Sit to Stand Details (indicate cue type and reason) Mod Ind from chairs with armrests, SBA chairs without armrests, MinA from garden kneel bench   Stand to Sit 6: Modified independent (Device/Increase  time);5: Supervision;4: Min assist;With upper extremity assist;To chair/3-in-1  to chairs with & without armrests, garden kneel bench   Stand to Sit Details MinA to garden kneel bench, SBA to chairs without armrests, Mod Ind chairs with armrests   Floor to Transfer 4: Min assist;5: Supervision;With upper extremity assist   Floor to Transfer Details (indicate cue type and reason) Demo & verbal cues on technique pushing on chair; Pt return demo understanding with MinA initially.      Ambulation/Gait   Ambulation/Gait Yes   Ambulation/Gait Assistance 5: Supervision;4: Min assist;3: Mod assist;6: Modified independent (Device/Increase time)  Mod Ind <300'. SBA >300' hard surface, MinA grass, ModA slop   Ambulation/Gait Assistance Details Mod A on grass slope, MinA on grass,    Ambulation Distance (Feet) 1000 Feet   Assistive device Straight cane;Prostheses   Gait Pattern Step-through pattern;Decreased arm swing - left;Decreased stride length;Left hip hike;Right hip hike;Trunk flexed   Ambulation Surface Indoor;Level;Outdoor;Unlevel;Paved;Grass   Stairs Yes   Stairs Assistance 6: Modified independent (Device/Increase time)   Stairs Assistance Details (indicate cue type and reason) left rail & cane RUE ascend and 2 hands on right rail descending   Stair Management Technique One rail Left;With cane;Step to pattern;Forwards   Number of Stairs 4   Ramp 5: Supervision  cane & Bil TTA prostheses   Curb 5: Supervision;4: Min assist  SBA outdoor std curb & MinA curb leading to grass                  PT Short Term Goals - 07/21/17 1949      PT SHORT TERM GOAL #1   Title Patient tolerates bil. LE prostheses wear >75% of awake hours without skin issues.    Baseline wound on RLE healed but still using Tegaderm as protection.    Time 1   Period Months   Status Partially Met     PT SHORT TERM GOAL #2   Title Patient performs sit to/from stand from chairs without armrests without UE support  to stablize modified independent.    Baseline MET 07/21/2017   Time 1   Period Months   Status Achieved     PT SHORT TERM GOAL #3   Title Patient reaches 10" anteriorly & within 5" of floor without UE support with supervision.    Baseline MET 07/20/2017   Time 1   Period Months   Status Achieved     PT SHORT TERM GOAL #4   Title Patient ambulates 300' with single UE support or less with minA.    Baseline  MET 07/21/2017   Time 1   Period Months   Status Achieved     PT SHORT TERM GOAL #5   Title Patient negotiates ramps & curbs with cane or less with bil. prostheses with minA.    Baseline MET 07/21/17   Time 1   Period Months   Status Achieved           PT Long Term Goals - 08/16/17 2209      PT LONG TERM GOAL #1   Title Patient demonstrates & verbalizes understanding of proper prosthetic care including independent donning to enable safe use of LE prostheses.    Baseline MET 08/16/17   Time 2   Period Months   Status Achieved     PT LONG TERM GOAL #2   Title Patient will tolerate prostheses wear >90% of awake hours without skin issues or limb pain to demonstrate a decrease in his risk of falling.   Time 2   Period Months   Status On-going     PT LONG TERM GOAL #3   Title Patient performs standing balance activities with intermittent UE support reaching 10", picking up objects from floor and looks over shoulders with weight shift, trunk rotation with prostheses independently.   Time 4   Period Weeks   Status On-going     PT LONG TERM GOAL #4   Title Patient will ambulate 1000 feet including outdoor surfaces with LRAD and prostheses modified independently to enable community mobility.     Baseline Progressing 08/16/17 Pt ambulates 1000' but requires supervison >300' with cane and minA on grass/modA on grass slope   Time 4   Period Months   Status On-going     PT LONG TERM GOAL #5   Title Patient will negotiate ramp/curbs and stairs with LRAD and prostheses modified  independent for community access.    Baseline Partially MET  with cane 08/16/17   Time 4   Period Months   Status Partially Met     PT LONG TERM GOAL #6   Title Patient's gait velocity will be >/= 1.67f/s to indicate a limited community ambulator. (Target Date: 08/20/2017)   Baseline MET 08/16/17 with cane   Time 4   Period Weeks   Status Achieved     PT LONG TERM GOAL #7   Title Patient reports Activities of Balance Confindence score using FOTO >25% to indicate greater confidence in his balance. (Target Date: 08/20/2017)   Time 4   Period Months   Status On-going               Plan - 08/16/17 2214    Clinical Impression Statement Patient met or partially met 3 of 7 LTGs with cane & prostheses. Patient appears would benefit from additional PT to progress prosthetic gait limited community & household with prostheses ony and full community with cane.    Rehab Potential Good   Clinical Impairments Affecting Rehab Potential arthritis, bicuspid aortic valve, BPH, carpal tunnel syndrome in R wrist, CAD, diverticulitis, GERD, gout, hyperlipidemia, HTN, DM type II,thrombocytopenia, thoracic ascending aortic aneurysm, severe aortic stenosis, amputation of bilateral hands except right thumb (12/11/2016) due to gangrene, bilateral transtibial amputations (12/11/2016) due to gangrene, R and L heart cath (10/20/2016)    PT Frequency 2x / week   PT Duration Other (comment)  60 days (9 weeks)   PT Treatment/Interventions ADLs/Self Care Home Management;Neuromuscular re-education;Balance training;Therapeutic exercise;Therapeutic activities;Functional mobility training;Stair training;Gait training;DME Instruction;Patient/family education;Prosthetic Training   PT Next Visit Plan  Complete reassessment and recertifiy 1x/wk    Consulted and Agree with Plan of Care Patient;Family member/caregiver   Family Member Consulted wife      Patient will benefit from skilled therapeutic intervention in order to  improve the following deficits and impairments:  Abnormal gait, Decreased activity tolerance, Decreased balance, Decreased coordination, Decreased range of motion, Decreased mobility, Decreased knowledge of use of DME, Decreased knowledge of precautions, Decreased endurance, Decreased skin integrity, Decreased scar mobility, Decreased strength, Difficulty walking, Postural dysfunction, Prosthetic Dependency  Visit Diagnosis: Unsteadiness on feet  Muscle weakness (generalized)  Other abnormalities of gait and mobility  Other symptoms and signs involving the musculoskeletal system     Problem List Patient Active Problem List   Diagnosis Date Noted  . Amputation of both hands with complication, subsequent encounter 03/01/2017  . Diabetes ([place]) 02/19/2017  . Status post bilateral below knee amputation ([place]) 02/09/2017  . Wound disruption, post-op, skin, sequela 02/09/2017  . Debility 02/05/2017  . Ganglion upper arm, right   . Thrombosis of both upper extremities   . Suspected heparin induced thrombocytopenia (HIT) in hospitalized patient ([place])   . Gangrene of lower extremity ([place])   . Heparin induced thrombocytopenia (HIT) ([place])   . Tracheostomy status ([place])   . Chest tube in place   . Atherosclerosis of native arteries of extremities with gangrene, left leg ([place])   . Atherosclerosis of native arteries of extremities with gangrene, right leg ([place])   . Acute on chronic diastolic CHF (congestive heart failure) ([place])   . Enteritis due to Clostridium difficile   . Anasarca   . FUO (fever of unknown origin)   . Acute encephalopathy   . Elevated LFTs   . DIC (disseminated intravascular coagulation) ([place]) 10/28/2016  . Postoperative atrial fibrillation ([place]) 10/24/2016  . Cardiogenic shock ([place])   . Mitral regurgitation due to cusp prolapse 10/22/2016  . S/P aortic valve replacement with bioprosthetic valve 10/22/2016  . S/P ascending aortic aneurysm repair 10/22/2016  . S/P mitral  valve repair 10/22/2016  . S/P CABG x 3 10/22/2016  . Thrombocytopenia ([place]) 10/22/2016  . Acute combined systolic and diastolic congestive heart failure ([place]) 10/22/2016  . Acute respiratory failure ([place]) 10/22/2016  . Thoracic ascending aortic aneurysm ([place]) 10/21/2016  . Coronary artery disease involving native coronary artery of native heart with unstable angina pectoris ([place]) 10/20/2016  . Severe aortic stenosis by prior echocardiography 10/19/2016  . Unstable angina ([place]) 10/19/2016  . Aortic valve stenosis 10/19/2016  . Bicuspid aortic valve 10/19/2016  . Trigger ring finger of right hand   . Wears glasses   . Gout   . Hypertension   . Carpal tunnel syndrome of right wrist   . Arthritis   . Snores     Sammie Schermerhorn PT, DPT 08/16/2017, 10:18 PM  [facility] [address] [place], [place], [address] Phone: [phone]   Fax:  [phone]  Name: CHRYSTOPHER STANGL MRN: [id number] Date of Birth: Sep 15, 1946

## 2017-08-18 ENCOUNTER — Ambulatory Visit: Payer: Medicare Other | Admitting: Physical Therapy

## 2017-08-18 ENCOUNTER — Encounter: Payer: Self-pay | Admitting: Physical Therapy

## 2017-08-18 DIAGNOSIS — R2689 Other abnormalities of gait and mobility: Secondary | ICD-10-CM | POA: Diagnosis not present

## 2017-08-18 DIAGNOSIS — R208 Other disturbances of skin sensation: Secondary | ICD-10-CM

## 2017-08-18 DIAGNOSIS — R293 Abnormal posture: Secondary | ICD-10-CM | POA: Diagnosis not present

## 2017-08-18 DIAGNOSIS — M6281 Muscle weakness (generalized): Secondary | ICD-10-CM | POA: Diagnosis not present

## 2017-08-18 DIAGNOSIS — R2681 Unsteadiness on feet: Secondary | ICD-10-CM | POA: Diagnosis not present

## 2017-08-18 DIAGNOSIS — R29898 Other symptoms and signs involving the musculoskeletal system: Secondary | ICD-10-CM

## 2017-08-18 NOTE — Therapy (Signed)
[facility] [address] [place], [place], [address] Phone: [phone]   Fax:  [phone]  Physical Therapy Treatment  Patient Details  Name: Scott [name] MRN: [id number] Date of Birth: Jan 26, 1946 Referring Provider: Meridee Score MD   Encounter Date: 08/18/2017      PT End of Session - 08/18/17 2209    Visit Number 33   Number of Visits 41   Date for PT Re-Evaluation 10/13/17   Authorization Type Medicare & G-codes every 10th visit    PT Start Time 1100   PT Stop Time 1153   PT Time Calculation (min) 53 min   Equipment Utilized During Treatment Gait belt   Activity Tolerance Patient tolerated treatment well   Behavior During Therapy [facility] for tasks assessed/performed      Past Medical History:  Diagnosis Date  . Arthritis    "hips, shoulders; knees; back" (10/20/2016)  . Bicuspid aortic valve   . BPH (benign prostatic hypertrophy)   . Carpal tunnel syndrome of right wrist   . Coronary artery disease involving native coronary artery of native heart with unstable angina pectoris ([place]) 10/20/2016  . Diverticulitis   . GERD (gastroesophageal reflux disease)   . Gout   . Heart murmur   . Hyperlipemia   . Hypertension   . Postoperative atrial fibrillation ([place]) 10/24/2016  . RLS (restless legs syndrome)   . S/P aortic valve replacement with bioprosthetic valve 10/22/2016   25 mm [facility] Ease bovine pericardial bioprosthetic tissue valve  . S/P ascending aortic aneurysm repair 10/22/2016   28 mm supracoronary straight graft replacement of ascending thoracic aortic aneurysm  . S/P CABG x 3 10/22/2016   Sequential LIMA to Diag and LAD, SVG to distal LAD, open vein harvest right thigh  . S/P mitral valve repair 10/22/2016   Artificial Gore-tex neochord placement x6 - posterior annuloplasty band placed but removed due to systolic anterior motion of mitral valve  . Severe aortic stenosis   . Snores    Never been tested for sleep apnea  . Thoracic ascending aortic aneurysm ([place]) 10/21/2016  . Thrombocytopenia ([place]) 10/22/2016  . Type II diabetes mellitus ([place])     Past Surgical History:  Procedure Laterality Date  . AMPUTATION Bilateral 12/11/2016   Procedure: AMPUTATION BELOW KNEE BILATERALLY;  Surgeon: [name], MD;  Location: [place];  Service: Orthopedics;  Laterality: Bilateral;  . AMPUTATION Bilateral 12/11/2016   Procedure: AMPUTATION BILATERAL HANDS EXCEPT RIGHT THUMB;  Surgeon: [name], MD;  Location: [place];  Service: Orthopedics;  Laterality: Bilateral;  . AORTIC VALVE REPLACEMENT N/A 10/22/2016   Procedure: AORTIC VALVE REPLACEMENT (AVR) WITH SIZE 25 MM MAGNA EASE PERICARDIAL BIOPROSTHESIS - AORTIC;  Surgeon: Rexene Alberts, MD;  Location: [place];  Service: Open Heart Surgery;  Laterality: N/A;  . BONE EXCISION Right 02/01/2017   Procedure: EXCISION RIGHT INDEX METACARPAL HEAD;  Surgeon: [name], MD;  Location: [place];  Service: Orthopedics;  Laterality: Right;  . CARDIAC CATHETERIZATION N/A 10/20/2016   Procedure: Right/Left Heart Cath and Coronary Angiography;  Surgeon: Leonie Man, MD;  Location: [facility];  Service: Cardiovascular;  Laterality: N/A;  . CARPAL TUNNEL RELEASE Right 11/28/2013   Procedure: RIGHT WRIST CARPAL TUNNEL RELEASE;  Surgeon: Lorn Junes, MD;  Location: [place];  Service: Orthopedics;  Laterality: Right;  . CATARACT EXTRACTION W/ INTRAOCULAR LENS  IMPLANT, BILATERAL Bilateral 1978  . COLONOSCOPY    .  CORONARY ARTERY BYPASS GRAFT N/A 10/22/2016   Procedure: CORONARY ARTERY BYPASS GRAFTING (CABG)x 2 WITH LIMA TO DIAGONAL, OPEN  HARVESTING OF RIGHT SAPHENOUS VEIN FOR VEIN GRAFT TO LAD;  Surgeon: Rexene Alberts, MD;  Location: [place];  Service: Open Heart Surgery;  Laterality: N/A;  . FRACTURE SURGERY    . INGUINAL HERNIA REPAIR Right 1998  . IR GENERIC HISTORICAL  12/07/2016   IR US GUIDE VASC ACCESS RIGHT  12/07/2016 Aletta Edouard, MD [facility]  . IR GENERIC HISTORICAL  12/07/2016   IR RADIOLOGY PERIPHERAL GUIDED IV START 12/07/2016 Aletta Edouard, MD [facility]  . IR GENERIC HISTORICAL  12/07/2016   IR GASTROSTOMY TUBE MOD SED 12/07/2016 Aletta Edouard, MD [facility]  . LIPOMA EXCISION Right 2008   "side of my head"  . MITRAL VALVE REPAIR N/A 10/22/2016   Procedure: MITRAL VALVE REPAIR (MVR) WITH SIZE 30 SORIN ANNULOFLEX ANNULOPLASTY RING WITH SUBSEQUENT REMOVAL OF RING;  Surgeon: Rexene Alberts, MD;  Location: [place];  Service: Open Heart Surgery;  Laterality: N/A;  . PENECTOMY  2007   Peyronie's disease   . PENILE PROSTHESIS IMPLANT  2009  . REMOVAL OF PENILE PROSTHESIS N/A 02/01/2017   Procedure: REMOVAL OF PENILE PROSTHESIS;  Surgeon: Kathie Rhodes, MD;  Location: [place];  Service: Urology;  Laterality: N/A;  . SHOULDER OPEN ROTATOR CUFF REPAIR Right 2006  . STERNAL CLOSURE N/A 10/26/2016   Procedure: STERNAL WASHOUT AND DELAYED PRIMARY CLOSURE;  Surgeon: Rexene Alberts, MD;  Location: [place];  Service: Thoracic;  Laterality: N/A;  . TEE WITHOUT CARDIOVERSION N/A 10/26/2016   Procedure: TRANSESOPHAGEAL ECHOCARDIOGRAM (TEE);  Surgeon: Rexene Alberts, MD;  Location: [place];  Service: Thoracic;  Laterality: N/A;  . TEE WITHOUT CARDIOVERSION N/A 10/22/2016   Procedure: TRANSESOPHAGEAL ECHOCARDIOGRAM (TEE);  Surgeon: Rexene Alberts, MD;  Location: [place];  Service: Open Heart Surgery;  Laterality: N/A;  . THORACIC AORTIC ANEURYSM REPAIR  10/22/2016   Procedure: ASCENDING AORTIC  ANEURYSM REPAIR (AAA) WITH 28 MM HEMASHIELD PLATINUM WOVEN DOUBLE VELOUR VASCULAR GRAFT;  Surgeon: Rexene Alberts, MD;  Location: [place];  Service: Open Heart Surgery;;  . TONSILLECTOMY  ~ 1955  . TRACHEOSTOMY TUBE PLACEMENT N/A 11/09/2016   Procedure: TRACHEOSTOMY;  Surgeon: Rexene Alberts, MD;  Location: [facility];  Service: Thoracic;  Laterality: N/A;  . TRANSURETHRAL RESECTION OF PROSTATE  2005  . TRIGGER FINGER  RELEASE Bilateral    several lt and rt hands  . TRIGGER FINGER RELEASE Right 11/28/2013   Procedure: RIGHT TRIGGER FINGER  RELEASE (TENDON SHEATH INCISION);  Surgeon: Lorn Junes, MD;  Location: [place];  Service: Orthopedics;  Laterality: Right;  Marland Kitchen VIDEO BRONCHOSCOPY N/A 11/09/2016   Procedure: VIDEO BRONCHOSCOPY;  Surgeon: Rexene Alberts, MD;  Location: [place];  Service: Thoracic;  Laterality: N/A;  . WRIST FRACTURE SURGERY Right ~ 1959    There were no vitals filed for this visit.      Subjective Assessment - 08/18/17 1101    Subjective He had to get onto floor to pick up something without issues.    Pertinent History arthritis, bicuspid aortic valve, BPH, carpal tunnel syndrome in R wrist, CAD, diverticulitis, GERD, gout, hyperlipidemia, HTN, DM type II,thrombocytopenia, thoracic ascending aortic aneurysm, severe aortic stenosis, amputation of bilateral hands except right thumb (12/11/2016) due to gangrene, bilateral transtibial amputations (12/11/2016) due to gangrene, R and L heart cath (10/20/2016)    Limitations Standing;Walking;House hold activities   Patient  Stated Goals use prostheses to walk including stairs, outdoors, target shooting, drive   Currently in Pain? No/denies                         [facility] Adult PT Treatment/Exercise - 08/18/17 1100      Transfers   Transfers Sit to Stand;Stand to Sit   Sit to Stand 5: Supervision;With upper extremity assist;From chair/3-in-1  from chair without armrests pushing w/ UEs no touch to stab   Stand to Sit 5: Supervision;With upper extremity assist;To chair/3-in-1  chair without armrests w/ UEs,      Ambulation/Gait   Ambulation/Gait Yes   Ambulation/Gait Assistance 4: Min assist;5: Supervision   Ambulation/Gait Assistance Details Verbal cues of posture, step width, wt shift, direction changes,    Ambulation Distance (Feet) 400 Feet  400' X 2   Assistive device Prostheses;None   Gait Pattern  Step-through pattern;Decreased stride length;Left hip hike;Right hip hike;Trunk flexed;Wide base of support   Ambulation Surface Indoor;Level   Gait velocity No device TTA prostheses only 1.99 ft/sec comfortable and 2.33 ft/sec fast pace with minA  with cane 2.18 ft/sec comfortable & 2.91 ft/sec fast pace   Gait velocity - backwards MinA for backwards gait without AD; decreased step length.   Stairs Yes   Stairs Assistance 5: Supervision   Stairs Assistance Details (indicate cue type and reason) 2 rails for alternating pattern: verbal cues for wt shift and foot position   Stair Management Technique Two rails;Alternating pattern;Forwards   Ramp 4: Min assist  TTA prostheses only   Ramp Details (indicate cue type and reason) upright posture   Curb 4: Min assist  TTA prostheses only   Curb Details (indicate cue type and reason) verbal cues on foot position, wt shift & sequence     High Level Balance   High Level Balance Activities Side stepping;Backward walking;Turns;Head turns;Negotiating over obstacles;Negotitating around obstacles  TTA prostheses only   High Level Balance Comments verbal cues on turning 90* to right & left, technique & balance reactions     Self-Care   Self-Care Lifting   Lifting Picking up 12" ball from floor, carrying 50' and placing on floor under control. PT demo, tactile & verbal cues technique with bil. TTA prostheses. Pt required MinA.     Prosthetics   Prosthetic Care Comments  Donning long pants with TTA prostheses.    Current prosthetic wear tolerance (days/week)  daily   Current prosthetic wear tolerance (#hours/day)  reports all awake hours but removes prostheses, leaving liners on limbs for 30 min when uncomfortable.   Residual limb condition  No open areas. Good hair growth. Normal temperature & color.    Education Provided Proper Donning;Prosthetic Research officer, political party) Educated Patient;Spouse   Education Method Explanation;Demonstration;Verbal cues    Education Method Verbalized understanding;Verbal cues required;Needs further instruction   Donning Prosthesis Modified independent (device/increased time)   Doffing Prosthesis Modified independent (device/increased time)                  PT Short Term Goals - 08/18/17 2230      PT SHORT TERM GOAL #1   Title Patient ambulates 200' with bilateral prostheses no assistive device with supervision.    Time 4   Period Weeks   Status New   Target Date 09/17/17     PT SHORT TERM GOAL #2   Title Patient able to lift <1# object with UE prosthesis with supervision.    Time  4   Period Weeks   Status New   Target Date 09/17/17     PT SHORT TERM GOAL #3   Title Patient negotiates standard height ramps & curbs outdoors with prostheses only with supervision.    Time 1   Period Months   Status New   Target Date 09/17/17     PT SHORT TERM GOAL #4   Title Patient ambulates 100' on non-sloped grass with cane & prostheses with supervision.    Time 1   Period Months   Status New   Target Date 09/17/17           PT Long Term Goals - 08/18/17 2212      PT LONG TERM GOAL #1   Title Patient demonstrates & verbalizes understanding of proper prosthetic care including independent donning to enable safe use of LE prostheses.    Baseline MET 08/16/17   Time 2   Period Months   Status Achieved     PT LONG TERM GOAL #2   Title Patient will tolerate prostheses wear >90% of awake hours without skin issues or limb pain to demonstrate a decrease in his risk of falling.   Baseline MET 08/18/2017   Time 2   Period Months   Status Achieved     PT LONG TERM GOAL #3   Title Patient performs standing balance activities with intermittent UE support reaching 10", picking up objects from floor and looks over shoulders with weight shift, trunk rotation with prostheses independently.   Baseline Progressing 08/18/2017 Pt requires UE support to reach 10" & look over shoulders, minA to pick up object  off floor.   Time 2   Period Months   Status On-going   Target Date 10/13/17     PT LONG TERM GOAL #4   Title Patient will ambulate 1000 feet including outdoor surfaces with cane and prostheses modified independently to enable community mobility.     Baseline Progressing 08/16/17 Pt ambulates 1000' but requires supervison >300' with cane and minA on grass/modA on grass slope;  On 08/18/2017 pt ambulates up to 400' without assistive device TTA prostheses only with intermittent MinA for balance loss.   Time 2   Period Months   Status On-going   Target Date 10/13/17     PT LONG TERM GOAL #5   Title Patient will negotiate ramp/curbs without assistive device with TTA prostheses only and stairs with 1 rail and prostheses modified independent for community access.    Time 2   Period Months   Status Revised   Target Date 10/13/17     PT LONG TERM GOAL #6   Title Patient ambulates 100' household around furniture and 300' on paved surfaces with prostheses only modified independent.    Time 2   Period Months   Status New   Target Date 10/13/17     PT LONG TERM GOAL #7   Title Patient reports Activities of Balance Confindence score using FOTO >25% to indicate greater confidence in his balance. (Target Date: 08/20/2017)   Time 2   Period Months   Status On-going   Target Date 10/13/17     PT LONG TERM GOAL #8   Title Patient able to lift objects with UE prosthesis from floor modified independent.    Time 2   Period Months   Status New   Target Date 10/13/17               Plan - 08/18/17 2224  Clinical Impression Statement Patient is progressing with bilateral Transtibial Prostheses. He is wearing TTA prostheses all awake hours with no skin issues. Patient ambulates basic community (up to 300') on paved firm surfaces and negotiates standard height ramps & curbs modified independent. Prosthetic gait on grass especially with slopes using cane requires minimal assist. Prosthetic  gait for household & basic communtiy prostheses requires minimal assist. Patient has potential to meet higher level of function with bilateral Transtibial prostheses with additional skilled care.    Rehab Potential Good   Clinical Impairments Affecting Rehab Potential arthritis, bicuspid aortic valve, BPH, carpal tunnel syndrome in R wrist, CAD, diverticulitis, GERD, gout, hyperlipidemia, HTN, DM type II,thrombocytopenia, thoracic ascending aortic aneurysm, severe aortic stenosis, amputation of bilateral hands except right thumb (12/11/2016) due to gangrene, bilateral transtibial amputations (12/11/2016) due to gangrene, R and L heart cath (10/20/2016)    PT Frequency 2x / week   PT Duration 8 weeks   PT Treatment/Interventions ADLs/Self Care Home Management;Neuromuscular re-education;Balance training;Therapeutic exercise;Therapeutic activities;Functional mobility training;Stair training;Gait training;DME Instruction;Patient/family education;Prosthetic Training   PT Next Visit Plan prosthetic gait basic community skills including ramps/curbs with prostheses only and fuller community including grass with cane.    Consulted and Agree with Plan of Care Patient;Family member/caregiver   Family Member Consulted wife      Patient will benefit from skilled therapeutic intervention in order to improve the following deficits and impairments:  Abnormal gait, Decreased activity tolerance, Decreased balance, Decreased coordination, Decreased range of motion, Decreased mobility, Decreased knowledge of use of DME, Decreased knowledge of precautions, Decreased endurance, Decreased skin integrity, Decreased scar mobility, Decreased strength, Difficulty walking, Postural dysfunction, Prosthetic Dependency  Visit Diagnosis: Unsteadiness on feet  Muscle weakness (generalized)  Other abnormalities of gait and mobility  Other symptoms and signs involving the musculoskeletal system  Abnormal posture  Other  disturbances of skin sensation     Problem List Patient Active Problem List   Diagnosis Date Noted  . Amputation of both hands with complication, subsequent encounter 03/01/2017  . Diabetes ([place]) 02/19/2017  . Status post bilateral below knee amputation ([place]) 02/09/2017  . Wound disruption, post-op, skin, sequela 02/09/2017  . Debility 02/05/2017  . Ganglion upper arm, right   . Thrombosis of both upper extremities   . Suspected heparin induced thrombocytopenia (HIT) in hospitalized patient ([place])   . Gangrene of lower extremity ([place])   . Heparin induced thrombocytopenia (HIT) ([place])   . Tracheostomy status ([place])   . Chest tube in place   . Atherosclerosis of native arteries of extremities with gangrene, left leg ([place])   . Atherosclerosis of native arteries of extremities with gangrene, right leg ([place])   . Acute on chronic diastolic CHF (congestive heart failure) ([place])   . Enteritis due to Clostridium difficile   . Anasarca   . FUO (fever of unknown origin)   . Acute encephalopathy   . Elevated LFTs   . DIC (disseminated intravascular coagulation) ([place]) 10/28/2016  . Postoperative atrial fibrillation ([place]) 10/24/2016  . Cardiogenic shock ([place])   . Mitral regurgitation due to cusp prolapse 10/22/2016  . S/P aortic valve replacement with bioprosthetic valve 10/22/2016  . S/P ascending aortic aneurysm repair 10/22/2016  . S/P mitral valve repair 10/22/2016  . S/P CABG x 3 10/22/2016  . Thrombocytopenia ([place]) 10/22/2016  . Acute combined systolic and diastolic congestive heart failure ([place]) 10/22/2016  . Acute respiratory failure ([place]) 10/22/2016  . Thoracic ascending aortic aneurysm ([place]) 10/21/2016  . Coronary artery disease involving native  coronary artery of native heart with unstable angina pectoris ([place]) 10/20/2016  . Severe aortic stenosis by prior echocardiography 10/19/2016  . Unstable angina ([place]) 10/19/2016  . Aortic valve stenosis 10/19/2016  . Bicuspid aortic valve  10/19/2016  . Trigger ring finger of right hand   . Wears glasses   . Gout   . Hypertension   . Carpal tunnel syndrome of right wrist   . Arthritis   . Snores     Demetrious Rainford PT, DPT 08/18/2017, 10:35 PM  [facility] [address] [place], [place], [address] Phone: [phone]   Fax:  [phone]  Name: Scott [name] MRN: [id number] Date of Birth: Jun 21, 1946

## 2017-08-23 ENCOUNTER — Encounter: Payer: Self-pay | Admitting: Physical Therapy

## 2017-08-23 ENCOUNTER — Ambulatory Visit: Payer: Medicare Other | Attending: Orthopedic Surgery | Admitting: Physical Therapy

## 2017-08-23 DIAGNOSIS — M6281 Muscle weakness (generalized): Secondary | ICD-10-CM | POA: Insufficient documentation

## 2017-08-23 DIAGNOSIS — R293 Abnormal posture: Secondary | ICD-10-CM | POA: Insufficient documentation

## 2017-08-23 DIAGNOSIS — Z23 Encounter for immunization: Secondary | ICD-10-CM | POA: Diagnosis not present

## 2017-08-23 DIAGNOSIS — R29898 Other symptoms and signs involving the musculoskeletal system: Secondary | ICD-10-CM | POA: Insufficient documentation

## 2017-08-23 DIAGNOSIS — R2689 Other abnormalities of gait and mobility: Secondary | ICD-10-CM | POA: Diagnosis not present

## 2017-08-23 DIAGNOSIS — R2681 Unsteadiness on feet: Secondary | ICD-10-CM | POA: Insufficient documentation

## 2017-08-24 NOTE — Therapy (Signed)
[facility] [address] [place], [place], [address] Phone: [phone]   Fax:  [phone]  Physical Therapy Treatment  Patient Details  Name: Scott [name] MRN: [id number] Date of Birth: Jun 03, 1946 Referring Provider: Meridee Score MD   Encounter Date: 08/23/2017      PT End of Session - 08/23/17 1106    Visit Number 34   Number of Visits 41   Date for PT Re-Evaluation 10/13/17   Authorization Type Medicare & G-codes every 10th visit    PT Start Time 1103   PT Stop Time 1145   PT Time Calculation (min) 42 min   Equipment Utilized During Treatment Gait belt   Activity Tolerance Patient tolerated treatment well   Behavior During Therapy WFL for tasks assessed/performed      Past Medical History:  Diagnosis Date  . Arthritis    "hips, shoulders; knees; back" (10/20/2016)  . Bicuspid aortic valve   . BPH (benign prostatic hypertrophy)   . Carpal tunnel syndrome of right wrist   . Coronary artery disease involving native coronary artery of native heart with unstable angina pectoris ([place]) 10/20/2016  . Diverticulitis   . GERD (gastroesophageal reflux disease)   . Gout   . Heart murmur   . Hyperlipemia   . Hypertension   . Postoperative atrial fibrillation ([place]) 10/24/2016  . RLS (restless legs syndrome)   . S/P aortic valve replacement with bioprosthetic valve 10/22/2016   25 mm [facility] bovine pericardial bioprosthetic tissue valve  . S/P ascending aortic aneurysm repair 10/22/2016   28 mm supracoronary straight graft replacement of ascending thoracic aortic aneurysm  . S/P CABG x 3 10/22/2016   Sequential LIMA to Diag and LAD, SVG to distal LAD, open vein harvest right thigh  . S/P mitral valve repair 10/22/2016   Artificial Gore-tex neochord placement x6 - posterior annuloplasty band placed but removed due to systolic anterior motion of mitral valve  . Severe aortic stenosis   . Snores    Never been tested for sleep apnea  . Thoracic ascending aortic aneurysm ([place]) 10/21/2016  . Thrombocytopenia ([place]) 10/22/2016  . Type II diabetes mellitus ([place])     Past Surgical History:  Procedure Laterality Date  . AMPUTATION Bilateral 12/11/2016   Procedure: AMPUTATION BELOW KNEE BILATERALLY;  Surgeon: [name], MD;  Location: [facility];  Service: Orthopedics;  Laterality: Bilateral;  . AMPUTATION Bilateral 12/11/2016   Procedure: AMPUTATION BILATERAL HANDS EXCEPT RIGHT THUMB;  Surgeon: [name], MD;  Location: [facility];  Service: Orthopedics;  Laterality: Bilateral;  . AORTIC VALVE REPLACEMENT N/A 10/22/2016   Procedure: AORTIC VALVE REPLACEMENT (AVR) WITH SIZE 25 MM MAGNA EASE PERICARDIAL BIOPROSTHESIS - AORTIC;  Surgeon: Rexene Alberts, MD;  Location: [facility];  Service: Open Heart Surgery;  Laterality: N/A;  . BONE EXCISION Right 02/01/2017   Procedure: EXCISION RIGHT INDEX METACARPAL HEAD;  Surgeon: [name], MD;  Location: [facility];  Service: Orthopedics;  Laterality: Right;  . CARDIAC CATHETERIZATION N/A 10/20/2016   Procedure: Right/Left Heart Cath and Coronary Angiography;  Surgeon: Leonie Man, MD;  Location: [facility];  Service: Cardiovascular;  Laterality: N/A;  . CARPAL TUNNEL RELEASE Right 11/28/2013   Procedure: RIGHT WRIST CARPAL TUNNEL RELEASE;  Surgeon: Lorn Junes, MD;  Location: [facility];  Service: Orthopedics;  Laterality: Right;  . CATARACT EXTRACTION W/ INTRAOCULAR LENS  IMPLANT, BILATERAL Bilateral 1978  . COLONOSCOPY    .  CORONARY ARTERY BYPASS GRAFT N/A 10/22/2016   Procedure: CORONARY ARTERY BYPASS GRAFTING (CABG)x 2 WITH LIMA TO DIAGONAL, OPEN  HARVESTING OF RIGHT SAPHENOUS VEIN FOR VEIN GRAFT TO LAD;  Surgeon: Rexene Alberts, MD;  Location: [facility];  Service: Open Heart Surgery;  Laterality: N/A;  . FRACTURE SURGERY    . INGUINAL HERNIA REPAIR Right 1998  . IR GENERIC HISTORICAL  12/07/2016   IR US GUIDE VASC ACCESS RIGHT  12/07/2016 Aletta Edouard, MD [facility]  . IR GENERIC HISTORICAL  12/07/2016   IR RADIOLOGY PERIPHERAL GUIDED IV START 12/07/2016 Aletta Edouard, MD [facility]  . IR GENERIC HISTORICAL  12/07/2016   IR GASTROSTOMY TUBE MOD SED 12/07/2016 Aletta Edouard, MD [facility]  . LIPOMA EXCISION Right 2008   "side of my head"  . MITRAL VALVE REPAIR N/A 10/22/2016   Procedure: MITRAL VALVE REPAIR (MVR) WITH SIZE 30 SORIN ANNULOFLEX ANNULOPLASTY RING WITH SUBSEQUENT REMOVAL OF RING;  Surgeon: Rexene Alberts, MD;  Location: [facility];  Service: Open Heart Surgery;  Laterality: N/A;  . PENECTOMY  2007   Peyronie's disease   . PENILE PROSTHESIS IMPLANT  2009  . REMOVAL OF PENILE PROSTHESIS N/A 02/01/2017   Procedure: REMOVAL OF PENILE PROSTHESIS;  Surgeon: Kathie Rhodes, MD;  Location: [facility];  Service: Urology;  Laterality: N/A;  . SHOULDER OPEN ROTATOR CUFF REPAIR Right 2006  . STERNAL CLOSURE N/A 10/26/2016   Procedure: STERNAL WASHOUT AND DELAYED PRIMARY CLOSURE;  Surgeon: Rexene Alberts, MD;  Location: [facility];  Service: Thoracic;  Laterality: N/A;  . TEE WITHOUT CARDIOVERSION N/A 10/26/2016   Procedure: TRANSESOPHAGEAL ECHOCARDIOGRAM (TEE);  Surgeon: Rexene Alberts, MD;  Location: [facility];  Service: Thoracic;  Laterality: N/A;  . TEE WITHOUT CARDIOVERSION N/A 10/22/2016   Procedure: TRANSESOPHAGEAL ECHOCARDIOGRAM (TEE);  Surgeon: Rexene Alberts, MD;  Location: [facility];  Service: Open Heart Surgery;  Laterality: N/A;  . THORACIC AORTIC ANEURYSM REPAIR  10/22/2016   Procedure: ASCENDING AORTIC  ANEURYSM REPAIR (AAA) WITH 28 MM HEMASHIELD PLATINUM WOVEN DOUBLE VELOUR VASCULAR GRAFT;  Surgeon: Rexene Alberts, MD;  Location: [facility];  Service: Open Heart Surgery;;  . TONSILLECTOMY  ~ 1955  . TRACHEOSTOMY TUBE PLACEMENT N/A 11/09/2016   Procedure: TRACHEOSTOMY;  Surgeon: Rexene Alberts, MD;  Location: [facility];  Service: Thoracic;  Laterality: N/A;  . TRANSURETHRAL RESECTION OF PROSTATE  2005  . TRIGGER FINGER  RELEASE Bilateral    several lt and rt hands  . TRIGGER FINGER RELEASE Right 11/28/2013   Procedure: RIGHT TRIGGER FINGER  RELEASE (TENDON SHEATH INCISION);  Surgeon: Lorn Junes, MD;  Location: [facility];  Service: Orthopedics;  Laterality: Right;  [place] VIDEO BRONCHOSCOPY N/A 11/09/2016   Procedure: VIDEO BRONCHOSCOPY;  Surgeon: Rexene Alberts, MD;  Location: [facility];  Service: Thoracic;  Laterality: N/A;  . WRIST FRACTURE SURGERY Right ~ 1959    There were no vitals filed for this visit.      Subjective Assessment - 08/23/17 1106    Subjective No new complaints. No falls or pain to report.    Patient is accompained by: Family member   Pertinent History arthritis, bicuspid aortic valve, BPH, carpal tunnel syndrome in R wrist, CAD, diverticulitis, GERD, gout, hyperlipidemia, HTN, DM type II,thrombocytopenia, thoracic ascending aortic aneurysm, severe aortic stenosis, amputation of bilateral hands except right thumb (12/11/2016) due to gangrene, bilateral transtibial amputations (12/11/2016) due to gangrene, R and L heart cath (10/20/2016)    Limitations Standing;Walking;House  hold activities   Patient Stated Goals use prostheses to walk including stairs, outdoors, target shooting, drive   Currently in Pain? No/denies   Pain Score 0-No pain           [facility] Adult PT Treatment/Exercise - 08/23/17 1108      Transfers   Transfers Sit to Stand;Stand to Sit   Sit to Stand 5: Supervision;With upper extremity assist;From chair/3-in-1   Stand to Sit 5: Supervision;With upper extremity assist;To chair/3-in-1     Ambulation/Gait   Ambulation/Gait Yes   Ambulation/Gait Assistance 4: Min guard;4: Min assist   Ambulation/Gait Assistance Details supervision with use of cane; 50 feet x2 no AD with cues on base of support, step length and min guard to min assist for balance.    Ambulation Distance (Feet) 50 Feet  x2, 100 x2   Assistive device Prostheses;None   Gait Pattern  Step-through pattern;Decreased stride length;Left hip hike;Right hip hike;Trunk flexed;Wide base of support   Ambulation Surface Level;Indoor   Curb 4: Min assist;Other (comment)  min guard at times   Curb Details (indicate cue type and reason) blocked practice on aerobic steps in parallel bars working on sequencing, step length, and weight shifting with no UE support (light touch at times to catch balance). min assist progressing to min gaurd assist at times. cues for stance position with descending to assist with balance as well.     Pre-Gait Activities worked on pivot turns right and left from fwd gait x 3-4 each way with min assist, cues on step placement/sequencing with the pivot turns as well     Prosthetics   Current prosthetic wear tolerance (days/week)  daily   Current prosthetic wear tolerance (#hours/day)  reports all awake hours but removes prostheses, leaving liners on limbs for 30 min when uncomfortable.   Residual limb condition  intact per pt   Donning Prosthesis Modified independent (device/increased time)   Doffing Prosthesis Modified independent (device/increased time)          PT Short Term Goals - 08/18/17 2230      PT SHORT TERM GOAL #1   Title Patient ambulates 200' with bilateral prostheses no assistive device with supervision.    Time 4   Period Weeks   Status New   Target Date 09/17/17     PT SHORT TERM GOAL #2   Title Patient able to lift <1# object with UE prosthesis with supervision.    Time 4   Period Weeks   Status New   Target Date 09/17/17     PT SHORT TERM GOAL #3   Title Patient negotiates standard height ramps & curbs outdoors with prostheses only with supervision.    Time 1   Period Months   Status New   Target Date 09/17/17     PT SHORT TERM GOAL #4   Title Patient ambulates 100' on non-sloped grass with cane & prostheses with supervision.    Time 1   Period Months   Status New   Target Date 09/17/17           PT Long Term  Goals - 08/18/17 2212      PT LONG TERM GOAL #1   Title Patient demonstrates & verbalizes understanding of proper prosthetic care including independent donning to enable safe use of LE prostheses.    Baseline MET 08/16/17   Time 2   Period Months   Status Achieved     PT LONG TERM GOAL #2   Title Patient  will tolerate prostheses wear >90% of awake hours without skin issues or limb pain to demonstrate a decrease in his risk of falling.   Baseline MET 08/18/2017   Time 2   Period Months   Status Achieved     PT LONG TERM GOAL #3   Title Patient performs standing balance activities with intermittent UE support reaching 10", picking up objects from floor and looks over shoulders with weight shift, trunk rotation with prostheses independently.   Baseline Progressing 08/18/2017 Pt requires UE support to reach 10" & look over shoulders, minA to pick up object off floor.   Time 2   Period Months   Status On-going   Target Date 10/13/17     PT LONG TERM GOAL #4   Title Patient will ambulate 1000 feet including outdoor surfaces with cane and prostheses modified independently to enable community mobility.     Baseline Progressing 08/16/17 Pt ambulates 1000' but requires supervison >300' with cane and minA on grass/modA on grass slope;  On 08/18/2017 pt ambulates up to 400' without assistive device TTA prostheses only with intermittent MinA for balance loss.   Time 2   Period Months   Status On-going   Target Date 10/13/17     PT LONG TERM GOAL #5   Title Patient will negotiate ramp/curbs without assistive device with TTA prostheses only and stairs with 1 rail and prostheses modified independent for community access.    Time 2   Period Months   Status Revised   Target Date 10/13/17     PT LONG TERM GOAL #6   Title Patient ambulates 100' household around furniture and 300' on paved surfaces with prostheses only modified independent.    Time 2   Period Months   Status New   Target Date  10/13/17     PT LONG TERM GOAL #7   Title Patient reports Activities of Balance Confindence score using FOTO >25% to indicate greater confidence in his balance. (Target Date: 08/20/2017)   Time 2   Period Months   Status On-going   Target Date 10/13/17     PT LONG TERM GOAL #8   Title Patient able to lift objects with UE prosthesis from floor modified independent.    Time 2   Period Months   Status New   Target Date 10/13/17           Plan - 08/23/17 1106    Clinical Impression Statement Today's skilled session focused on gait with no AD, curb negotiation with no AD and pivot turns from forward gait to change directions. Pt needed up to min assist for balance, along with cues on sequencing and technique. Pt is progressing toward goals and should benefit from continued PT to progress toward unmet goals.                   Rehab Potential Good   Clinical Impairments Affecting Rehab Potential arthritis, bicuspid aortic valve, BPH, carpal tunnel syndrome in R wrist, CAD, diverticulitis, GERD, gout, hyperlipidemia, HTN, DM type II,thrombocytopenia, thoracic ascending aortic aneurysm, severe aortic stenosis, amputation of bilateral hands except right thumb (12/11/2016) due to gangrene, bilateral transtibial amputations (12/11/2016) due to gangrene, R and L heart cath (10/20/2016)    PT Frequency 2x / week   PT Duration 8 weeks   PT Treatment/Interventions ADLs/Self Care Home Management;Neuromuscular re-education;Balance training;Therapeutic exercise;Therapeutic activities;Functional mobility training;Stair training;Gait training;DME Instruction;Patient/family education;Prosthetic Training   PT Next Visit Plan prosthetic gait basic community skills including ramps/curbs  with prostheses only and fuller community including grass with cane.    Consulted and Agree with Plan of Care Patient;Family member/caregiver   Family Member Consulted wife      Patient will benefit from skilled therapeutic  intervention in order to improve the following deficits and impairments:  Abnormal gait, Decreased activity tolerance, Decreased balance, Decreased coordination, Decreased range of motion, Decreased mobility, Decreased knowledge of use of DME, Decreased knowledge of precautions, Decreased endurance, Decreased skin integrity, Decreased scar mobility, Decreased strength, Difficulty walking, Postural dysfunction, Prosthetic Dependency  Visit Diagnosis: Unsteadiness on feet  Other abnormalities of gait and mobility     Problem List Patient Active Problem List   Diagnosis Date Noted  . Amputation of both hands with complication, subsequent encounter 03/01/2017  . Diabetes ([place]) 02/19/2017  . Status post bilateral below knee amputation ([place]) 02/09/2017  . Wound disruption, post-op, skin, sequela 02/09/2017  . Debility 02/05/2017  . Ganglion upper arm, right   . Thrombosis of both upper extremities   . Suspected heparin induced thrombocytopenia (HIT) in hospitalized patient ([place])   . Gangrene of lower extremity ([place])   . Heparin induced thrombocytopenia (HIT) ([place])   . Tracheostomy status ([place])   . Chest tube in place   . Atherosclerosis of native arteries of extremities with gangrene, left leg ([place])   . Atherosclerosis of native arteries of extremities with gangrene, right leg ([place])   . Acute on chronic diastolic CHF (congestive heart failure) ([place])   . Enteritis due to Clostridium difficile   . Anasarca   . FUO (fever of unknown origin)   . Acute encephalopathy   . Elevated LFTs   . DIC (disseminated intravascular coagulation) ([place]) 10/28/2016  . Postoperative atrial fibrillation ([place]) 10/24/2016  . Cardiogenic shock ([place])   . Mitral regurgitation due to cusp prolapse 10/22/2016  . S/P aortic valve replacement with bioprosthetic valve 10/22/2016  . S/P ascending aortic aneurysm repair 10/22/2016  . S/P mitral valve repair 10/22/2016  . S/P CABG x 3 10/22/2016  . Thrombocytopenia  ([place]) 10/22/2016  . Acute combined systolic and diastolic congestive heart failure ([place]) 10/22/2016  . Acute respiratory failure ([place]) 10/22/2016  . Thoracic ascending aortic aneurysm ([place]) 10/21/2016  . Coronary artery disease involving native coronary artery of native heart with unstable angina pectoris ([place]) 10/20/2016  . Severe aortic stenosis by prior echocardiography 10/19/2016  . Unstable angina ([place]) 10/19/2016  . Aortic valve stenosis 10/19/2016  . Bicuspid aortic valve 10/19/2016  . Trigger ring finger of right hand   . Wears glasses   . Gout   . Hypertension   . Carpal tunnel syndrome of right wrist   . Arthritis   . Snores     [place]ow [place], [place], [facility] [address] [place], [place] [address] [phone] 08/24/17, 11:47 AM   Name: Scott [name] MRN: [id number] Date of Birth: 1946-07-24

## 2017-08-26 ENCOUNTER — Ambulatory Visit (INDEPENDENT_AMBULATORY_CARE_PROVIDER_SITE_OTHER): Payer: Medicare Other | Admitting: *Deleted

## 2017-08-26 DIAGNOSIS — I4891 Unspecified atrial fibrillation: Secondary | ICD-10-CM | POA: Diagnosis not present

## 2017-08-26 DIAGNOSIS — Q231 Congenital insufficiency of aortic valve: Secondary | ICD-10-CM

## 2017-08-26 DIAGNOSIS — I82603 Acute embolism and thrombosis of unspecified veins of upper extremity, bilateral: Secondary | ICD-10-CM

## 2017-08-26 DIAGNOSIS — Z9889 Other specified postprocedural states: Secondary | ICD-10-CM

## 2017-08-26 DIAGNOSIS — I9789 Other postprocedural complications and disorders of the circulatory system, not elsewhere classified: Secondary | ICD-10-CM

## 2017-08-26 DIAGNOSIS — Z5181 Encounter for therapeutic drug level monitoring: Secondary | ICD-10-CM

## 2017-08-26 LAB — POCT INR: INR: 1.9

## 2017-08-30 ENCOUNTER — Ambulatory Visit: Payer: Medicare Other | Admitting: Physical Therapy

## 2017-08-30 ENCOUNTER — Encounter: Payer: Self-pay | Admitting: Physical Therapy

## 2017-08-30 DIAGNOSIS — M6281 Muscle weakness (generalized): Secondary | ICD-10-CM

## 2017-08-30 DIAGNOSIS — R29898 Other symptoms and signs involving the musculoskeletal system: Secondary | ICD-10-CM

## 2017-08-30 DIAGNOSIS — R2681 Unsteadiness on feet: Secondary | ICD-10-CM

## 2017-08-30 DIAGNOSIS — R293 Abnormal posture: Secondary | ICD-10-CM

## 2017-08-30 DIAGNOSIS — R2689 Other abnormalities of gait and mobility: Secondary | ICD-10-CM | POA: Diagnosis not present

## 2017-08-30 NOTE — Therapy (Signed)
[facility] [address] [place], [place], [address] Phone: [phone]   Fax:  [phone]  Physical Therapy Treatment  Patient Details  Name: Scott [name] MRN: [id number] Date of Birth: [birth year] Referring Provider: Meridee Score MD   Encounter Date: 08/30/2017      PT End of Session - 08/30/17 2241    Visit Number 35   Number of Visits 41   Date for PT Re-Evaluation 10/13/17   Authorization Type Medicare & G-codes every 10th visit    PT Start Time 1100   PT Stop Time 1148   PT Time Calculation (min) 48 min   Equipment Utilized During Treatment Gait belt   Activity Tolerance Patient tolerated treatment well   Behavior During Therapy [facility] for tasks assessed/performed      Past Medical History:  Diagnosis Date  . Arthritis    "hips, shoulders; knees; back" (10/20/2016)  . Bicuspid aortic valve   . BPH (benign prostatic hypertrophy)   . Carpal tunnel syndrome of right wrist   . Coronary artery disease involving native coronary artery of native heart with unstable angina pectoris ([place]) 10/20/2016  . Diverticulitis   . GERD (gastroesophageal reflux disease)   . Gout   . Heart murmur   . Hyperlipemia   . Hypertension   . Postoperative atrial fibrillation ([place]) 10/24/2016  . RLS (restless legs syndrome)   . S/P aortic valve replacement with bioprosthetic valve 10/22/2016   25 mm [facility] Ease bovine pericardial bioprosthetic tissue valve  . S/P ascending aortic aneurysm repair 10/22/2016   28 mm supracoronary straight graft replacement of ascending thoracic aortic aneurysm  . S/P CABG x 3 10/22/2016   Sequential LIMA to Diag and LAD, SVG to distal LAD, open vein harvest right thigh  . S/P mitral valve repair 10/22/2016   Artificial Gore-tex neochord placement x6 - posterior annuloplasty band placed but removed due to systolic anterior motion of mitral valve  . Severe aortic stenosis   . Snores    Never been tested for sleep apnea  . Thoracic ascending aortic aneurysm ([place]) 10/21/2016  . Thrombocytopenia ([place]) 10/22/2016  . Type II diabetes mellitus ([place])     Past Surgical History:  Procedure Laterality Date  . AMPUTATION Bilateral 12/11/2016   Procedure: AMPUTATION BELOW KNEE BILATERALLY;  Surgeon: [name], MD;  Location: [place];  Service: Orthopedics;  Laterality: Bilateral;  . AMPUTATION Bilateral 12/11/2016   Procedure: AMPUTATION BILATERAL HANDS EXCEPT RIGHT THUMB;  Surgeon: [name], MD;  Location: [place];  Service: Orthopedics;  Laterality: Bilateral;  . AORTIC VALVE REPLACEMENT N/A 10/22/2016   Procedure: AORTIC VALVE REPLACEMENT (AVR) WITH SIZE 25 MM MAGNA EASE PERICARDIAL BIOPROSTHESIS - AORTIC;  Surgeon: Rexene Alberts, MD;  Location: [place];  Service: Open Heart Surgery;  Laterality: N/A;  . BONE EXCISION Right 02/01/2017   Procedure: EXCISION RIGHT INDEX METACARPAL HEAD;  Surgeon: [name], MD;  Location: [place];  Service: Orthopedics;  Laterality: Right;  . CARDIAC CATHETERIZATION N/A 10/20/2016   Procedure: Right/Left Heart Cath and Coronary Angiography;  Surgeon: Leonie Man, MD;  Location: [place] [facility];  Service: Cardiovascular;  Laterality: N/A;  . CARPAL TUNNEL RELEASE Right 11/28/2013   Procedure: RIGHT WRIST CARPAL TUNNEL RELEASE;  Surgeon: Lorn Junes, MD;  Location: [place];  Service: Orthopedics;  Laterality: Right;  . CATARACT EXTRACTION W/ INTRAOCULAR LENS  IMPLANT, BILATERAL Bilateral 1978  . COLONOSCOPY    .  CORONARY ARTERY BYPASS GRAFT N/A 10/22/2016   Procedure: CORONARY ARTERY BYPASS GRAFTING (CABG)x 2 WITH LIMA TO DIAGONAL, OPEN  HARVESTING OF RIGHT SAPHENOUS VEIN FOR VEIN GRAFT TO LAD;  Surgeon: Rexene Alberts, MD;  Location: [place];  Service: Open Heart Surgery;  Laterality: N/A;  . FRACTURE SURGERY    . INGUINAL HERNIA REPAIR Right 1998  . IR GENERIC HISTORICAL  12/07/2016   IR US GUIDE VASC ACCESS RIGHT  12/07/2016 Aletta Edouard, MD [facility]  . IR GENERIC HISTORICAL  12/07/2016   IR RADIOLOGY PERIPHERAL GUIDED IV START 12/07/2016 Aletta Edouard, MD [facility]  . IR GENERIC HISTORICAL  12/07/2016   IR GASTROSTOMY TUBE MOD SED 12/07/2016 Aletta Edouard, MD [facility]  . LIPOMA EXCISION Right 2008   "side of my head"  . MITRAL VALVE REPAIR N/A 10/22/2016   Procedure: MITRAL VALVE REPAIR (MVR) WITH SIZE 30 SORIN ANNULOFLEX ANNULOPLASTY RING WITH SUBSEQUENT REMOVAL OF RING;  Surgeon: Rexene Alberts, MD;  Location: [place];  Service: Open Heart Surgery;  Laterality: N/A;  . PENECTOMY  2007   Peyronie's disease   . PENILE PROSTHESIS IMPLANT  2009  . REMOVAL OF PENILE PROSTHESIS N/A 02/01/2017   Procedure: REMOVAL OF PENILE PROSTHESIS;  Surgeon: Kathie Rhodes, MD;  Location: [place];  Service: Urology;  Laterality: N/A;  . SHOULDER OPEN ROTATOR CUFF REPAIR Right 2006  . STERNAL CLOSURE N/A 10/26/2016   Procedure: STERNAL WASHOUT AND DELAYED PRIMARY CLOSURE;  Surgeon: Rexene Alberts, MD;  Location: [place];  Service: Thoracic;  Laterality: N/A;  . TEE WITHOUT CARDIOVERSION N/A 10/26/2016   Procedure: TRANSESOPHAGEAL ECHOCARDIOGRAM (TEE);  Surgeon: Rexene Alberts, MD;  Location: [place];  Service: Thoracic;  Laterality: N/A;  . TEE WITHOUT CARDIOVERSION N/A 10/22/2016   Procedure: TRANSESOPHAGEAL ECHOCARDIOGRAM (TEE);  Surgeon: Rexene Alberts, MD;  Location: [place];  Service: Open Heart Surgery;  Laterality: N/A;  . THORACIC AORTIC ANEURYSM REPAIR  10/22/2016   Procedure: ASCENDING AORTIC  ANEURYSM REPAIR (AAA) WITH 28 MM HEMASHIELD PLATINUM WOVEN DOUBLE VELOUR VASCULAR GRAFT;  Surgeon: Rexene Alberts, MD;  Location: [facility];  Service: Open Heart Surgery;;  . TONSILLECTOMY  ~ 1955  . TRACHEOSTOMY TUBE PLACEMENT N/A 11/09/2016   Procedure: TRACHEOSTOMY;  Surgeon: Rexene Alberts, MD;  Location: [place];  Service: Thoracic;  Laterality: N/A;  . TRANSURETHRAL RESECTION OF PROSTATE  2005  . TRIGGER FINGER  RELEASE Bilateral    several lt and rt hands  . TRIGGER FINGER RELEASE Right 11/28/2013   Procedure: RIGHT TRIGGER FINGER  RELEASE (TENDON SHEATH INCISION);  Surgeon: Lorn Junes, MD;  Location: [place];  Service: Orthopedics;  Laterality: Right;  Marland Kitchen VIDEO BRONCHOSCOPY N/A 11/09/2016   Procedure: VIDEO BRONCHOSCOPY;  Surgeon: Rexene Alberts, MD;  Location: [place];  Service: Thoracic;  Laterality: N/A;  . WRIST FRACTURE SURGERY Right ~ 1959    There were no vitals filed for this visit.      Subjective Assessment - 08/30/17 1101    Subjective He has been working on walking around house without cane.      Patient is accompained by: Family member   Pertinent History arthritis, bicuspid aortic valve, BPH, carpal tunnel syndrome in R wrist, CAD, diverticulitis, GERD, gout, hyperlipidemia, HTN, DM type II,thrombocytopenia, thoracic ascending aortic aneurysm, severe aortic stenosis, amputation of bilateral hands except right thumb (12/11/2016) due to gangrene, bilateral transtibial amputations (12/11/2016) due to gangrene, R and L heart cath (10/20/2016)  Limitations Standing;Walking;House hold activities   Patient Stated Goals use prostheses to walk including stairs, outdoors, target shooting, drive   Currently in Pain? No/denies                         [facility] Adult PT Treatment/Exercise - 08/30/17 1100      Ambulation/Gait   Ambulation/Gait Yes   Ambulation/Gait Assistance 4: Min assist;4: Min guard;5: Supervision   Ambulation/Gait Assistance Details gait indoors with prostheses only with cues on posture, wt shfit,  turning 90* right & left, turning 180* with minA/wall touch;  outdoors on grass with cane.    Ambulation Distance (Feet) 500 Feet  500' indoors, 200' outdoors   Assistive device Prostheses;None;Straight cane  straight cane on grass   Ambulation Surface Level;Indoor;Outdoor;Paved;Gravel;Grass   Stairs Yes   Stairs Assistance 5: Supervision    Stair Management Technique Two rails;Alternating pattern;Forwards   Number of Stairs 4   Height of Stairs 4   Ramp 4: Min assist  prostheses only   Ramp Details (indicate cue type and reason) verbal cues on posture & wt shift   Curb 4: Min assist  prostheses only   Curb Details (indicate cue type and reason) demo, verbal & tactile cues on foot position, using momentum and knee control      High Level Balance   High Level Balance Activities Side stepping;Backward walking;Direction changes;Head turns;Negotitating around obstacles   High Level Balance Comments verbal cues on turning 90* to right & left, technique & balance reactions     Prosthetics   Prosthetic Care Comments  changing shoes on prostheses, heel ht, shoe durometer, tightening laces.    Current prosthetic wear tolerance (days/week)  daily   Current prosthetic wear tolerance (#hours/day)  reports all awake hours but removes prostheses, leaving liners on limbs for 30 min when uncomfortable.   Residual limb condition  intact per pt   Education Provided Proper Donning;Prosthetic cleaning   Person(s) Educated Patient;Spouse   Education Method Explanation;Demonstration;Tactile cues;Verbal cues   Education Method Verbalized understanding;Returned demonstration;Tactile cues required;Verbal cues required;Needs further instruction                  PT Short Term Goals - 08/18/17 2230      PT SHORT TERM GOAL #1   Title Patient ambulates 200' with bilateral prostheses no assistive device with supervision.    Time 4   Period Weeks   Status New   Target Date 09/17/17     PT SHORT TERM GOAL #2   Title Patient able to lift <1# object with UE prosthesis with supervision.    Time 4   Period Weeks   Status New   Target Date 09/17/17     PT SHORT TERM GOAL #3   Title Patient negotiates standard height ramps & curbs outdoors with prostheses only with supervision.    Time 1   Period Months   Status New   Target Date  09/17/17     PT SHORT TERM GOAL #4   Title Patient ambulates 100' on non-sloped grass with cane & prostheses with supervision.    Time 1   Period Months   Status New   Target Date 09/17/17           PT Long Term Goals - 08/18/17 2212      PT LONG TERM GOAL #1   Title Patient demonstrates & verbalizes understanding of proper prosthetic care including independent donning to enable safe use  of LE prostheses.    Baseline MET 08/16/17   Time 2   Period Months   Status Achieved     PT LONG TERM GOAL #2   Title Patient will tolerate prostheses wear >90% of awake hours without skin issues or limb pain to demonstrate a decrease in his risk of falling.   Baseline MET 08/18/2017   Time 2   Period Months   Status Achieved     PT LONG TERM GOAL #3   Title Patient performs standing balance activities with intermittent UE support reaching 10", picking up objects from floor and looks over shoulders with weight shift, trunk rotation with prostheses independently.   Baseline Progressing 08/18/2017 Pt requires UE support to reach 10" & look over shoulders, minA to pick up object off floor.   Time 2   Period Months   Status On-going   Target Date 10/13/17     PT LONG TERM GOAL #4   Title Patient will ambulate 1000 feet including outdoor surfaces with cane and prostheses modified independently to enable community mobility.     Baseline Progressing 08/16/17 Pt ambulates 1000' but requires supervison >300' with cane and minA on grass/modA on grass slope;  On 08/18/2017 pt ambulates up to 400' without assistive device TTA prostheses only with intermittent MinA for balance loss.   Time 2   Period Months   Status On-going   Target Date 10/13/17     PT LONG TERM GOAL #5   Title Patient will negotiate ramp/curbs without assistive device with TTA prostheses only and stairs with 1 rail and prostheses modified independent for community access.    Time 2   Period Months   Status Revised   Target Date  10/13/17     PT LONG TERM GOAL #6   Title Patient ambulates 100' household around furniture and 300' on paved surfaces with prostheses only modified independent.    Time 2   Period Months   Status New   Target Date 10/13/17     PT LONG TERM GOAL #7   Title Patient reports Activities of Balance Confindence score using FOTO >25% to indicate greater confidence in his balance. (Target Date: 08/20/2017)   Time 2   Period Months   Status On-going   Target Date 10/13/17     PT LONG TERM GOAL #8   Title Patient able to lift objects with UE prosthesis from floor modified independent.    Time 2   Period Months   Status New   Target Date 10/13/17               Plan - 08/30/17 2244    Clinical Impression Statement Patient improved ability to scan environment & change direction with skilled instruction with prostheses without AD. Patient improved ability to neg curbs with instruction in technique.    Rehab Potential Good   Clinical Impairments Affecting Rehab Potential arthritis, bicuspid aortic valve, BPH, carpal tunnel syndrome in R wrist, CAD, diverticulitis, GERD, gout, hyperlipidemia, HTN, DM type II,thrombocytopenia, thoracic ascending aortic aneurysm, severe aortic stenosis, amputation of bilateral hands except right thumb (12/11/2016) due to gangrene, bilateral transtibial amputations (12/11/2016) due to gangrene, R and L heart cath (10/20/2016)    PT Frequency 2x / week   PT Duration 8 weeks   PT Treatment/Interventions ADLs/Self Care Home Management;Neuromuscular re-education;Balance training;Therapeutic exercise;Therapeutic activities;Functional mobility training;Stair training;Gait training;DME Instruction;Patient/family education;Prosthetic Training   PT Next Visit Plan prosthetic gait basic community skills including ramps/curbs with prostheses only and fuller  community including grass with cane.    Consulted and Agree with Plan of Care Patient;Family member/caregiver   Family  Member Consulted wife      Patient will benefit from skilled therapeutic intervention in order to improve the following deficits and impairments:  Abnormal gait, Decreased activity tolerance, Decreased balance, Decreased coordination, Decreased range of motion, Decreased mobility, Decreased knowledge of use of DME, Decreased knowledge of precautions, Decreased endurance, Decreased skin integrity, Decreased scar mobility, Decreased strength, Difficulty walking, Postural dysfunction, Prosthetic Dependency  Visit Diagnosis: Unsteadiness on feet  Other abnormalities of gait and mobility  Muscle weakness (generalized)  Other symptoms and signs involving the musculoskeletal system  Abnormal posture     Problem List Patient Active Problem List   Diagnosis Date Noted  . Amputation of both hands with complication, subsequent encounter 03/01/2017  . Diabetes ([place]) 02/19/2017  . Status post bilateral below knee amputation ([place]) 02/09/2017  . Wound disruption, post-op, skin, sequela 02/09/2017  . Debility 02/05/2017  . Ganglion upper arm, right   . Thrombosis of both upper extremities   . Suspected heparin induced thrombocytopenia (HIT) in hospitalized patient ([place])   . Gangrene of lower extremity ([place])   . Heparin induced thrombocytopenia (HIT) ([place])   . Tracheostomy status ([place])   . Chest tube in place   . Atherosclerosis of native arteries of extremities with gangrene, left leg ([place])   . Atherosclerosis of native arteries of extremities with gangrene, right leg ([place])   . Acute on chronic diastolic CHF (congestive heart failure) ([place])   . Enteritis due to Clostridium difficile   . Anasarca   . FUO (fever of unknown origin)   . Acute encephalopathy   . Elevated LFTs   . DIC (disseminated intravascular coagulation) ([place]) 10/28/2016  . Postoperative atrial fibrillation ([place]) 10/24/2016  . Cardiogenic shock ([place])   . Mitral regurgitation due to cusp prolapse 10/22/2016  . S/P aortic  valve replacement with bioprosthetic valve 10/22/2016  . S/P ascending aortic aneurysm repair 10/22/2016  . S/P mitral valve repair 10/22/2016  . S/P CABG x 3 10/22/2016  . Thrombocytopenia ([place]) 10/22/2016  . Acute combined systolic and diastolic congestive heart failure ([place]) 10/22/2016  . Acute respiratory failure ([place]) 10/22/2016  . Thoracic ascending aortic aneurysm ([place]) 10/21/2016  . Coronary artery disease involving native coronary artery of native heart with unstable angina pectoris ([place]) 10/20/2016  . Severe aortic stenosis by prior echocardiography 10/19/2016  . Unstable angina ([place]) 10/19/2016  . Aortic valve stenosis 10/19/2016  . Bicuspid aortic valve 10/19/2016  . Trigger ring finger of right hand   . Wears glasses   . Gout   . Hypertension   . Carpal tunnel syndrome of right wrist   . Arthritis   . Snores     Grenda Lora PT, DPT 08/30/2017, 10:48 PM  [facility] [address] [place], [place], [address] Phone: [phone]   Fax:  [phone]  Name: Scott [name] MRN: [id number] Date of Birth: 1946-01-08

## 2017-09-01 DIAGNOSIS — E11319 Type 2 diabetes mellitus with unspecified diabetic retinopathy without macular edema: Secondary | ICD-10-CM | POA: Diagnosis not present

## 2017-09-06 ENCOUNTER — Ambulatory Visit: Payer: Medicare Other | Admitting: Physical Therapy

## 2017-09-06 ENCOUNTER — Encounter: Payer: Self-pay | Admitting: Physical Therapy

## 2017-09-06 DIAGNOSIS — R29898 Other symptoms and signs involving the musculoskeletal system: Secondary | ICD-10-CM | POA: Diagnosis not present

## 2017-09-06 DIAGNOSIS — R2689 Other abnormalities of gait and mobility: Secondary | ICD-10-CM

## 2017-09-06 DIAGNOSIS — R293 Abnormal posture: Secondary | ICD-10-CM | POA: Diagnosis not present

## 2017-09-06 DIAGNOSIS — M6281 Muscle weakness (generalized): Secondary | ICD-10-CM

## 2017-09-06 DIAGNOSIS — R2681 Unsteadiness on feet: Secondary | ICD-10-CM | POA: Diagnosis not present

## 2017-09-06 NOTE — Therapy (Signed)
[place] [place] [address] [place], [place], [address] Phone: [phone]   Fax:  [phone]  Physical Therapy Treatment  Patient Details  Name: Scott [name] MRN: [id number] Date of Birth: Jun 26, 1946 Referring Provider: Meridee Score MD   Encounter Date: 09/06/2017      PT End of Session - 09/06/17 2214    Visit Number 36   Number of Visits 41   Date for PT Re-Evaluation 10/13/17   Authorization Type Medicare & G-codes every 10th visit    PT Start Time 1100   PT Stop Time 1147   PT Time Calculation (min) 47 min   Equipment Utilized During Treatment Gait belt   Activity Tolerance Patient tolerated treatment well   Behavior During Therapy [facility] for tasks assessed/performed      Past Medical History:  Diagnosis Date  . Arthritis    "hips, shoulders; knees; back" (10/20/2016)  . Bicuspid aortic valve   . BPH (benign prostatic hypertrophy)   . Carpal tunnel syndrome of right wrist   . Coronary artery disease involving native coronary artery of native heart with unstable angina pectoris ([place]) 10/20/2016  . Diverticulitis   . GERD (gastroesophageal reflux disease)   . Gout   . Heart murmur   . Hyperlipemia   . Hypertension   . Postoperative atrial fibrillation ([place]) 10/24/2016  . RLS (restless legs syndrome)   . S/P aortic valve replacement with bioprosthetic valve 10/22/2016   25 mm [facility] Ease bovine pericardial bioprosthetic tissue valve  . S/P ascending aortic aneurysm repair 10/22/2016   28 mm supracoronary straight graft replacement of ascending thoracic aortic aneurysm  . S/P CABG x 3 10/22/2016   Sequential LIMA to Diag and LAD, SVG to distal LAD, open vein harvest right thigh  . S/P mitral valve repair 10/22/2016   Artificial Gore-tex neochord placement x6 - posterior annuloplasty band placed but removed due to systolic anterior motion of mitral valve  . Severe aortic stenosis   . Snores     Never been tested for sleep apnea  . Thoracic ascending aortic aneurysm ([place]) 10/21/2016  . Thrombocytopenia ([place]) 10/22/2016  . Type II diabetes mellitus ([place])     Past Surgical History:  Procedure Laterality Date  . AMPUTATION Bilateral 12/11/2016   Procedure: AMPUTATION BELOW KNEE BILATERALLY;  Surgeon: [name], MD;  Location: [place];  Service: Orthopedics;  Laterality: Bilateral;  . AMPUTATION Bilateral 12/11/2016   Procedure: AMPUTATION BILATERAL HANDS EXCEPT RIGHT THUMB;  Surgeon: [name], MD;  Location: [place];  Service: Orthopedics;  Laterality: Bilateral;  . AORTIC VALVE REPLACEMENT N/A 10/22/2016   Procedure: AORTIC VALVE REPLACEMENT (AVR) WITH SIZE 25 MM MAGNA EASE PERICARDIAL BIOPROSTHESIS - AORTIC;  Surgeon: Rexene Alberts, MD;  Location: [place];  Service: Open Heart Surgery;  Laterality: N/A;  . BONE EXCISION Right 02/01/2017   Procedure: EXCISION RIGHT INDEX METACARPAL HEAD;  Surgeon: [name], MD;  Location: [place];  Service: Orthopedics;  Laterality: Right;  . CARDIAC CATHETERIZATION N/A 10/20/2016   Procedure: Right/Left Heart Cath and Coronary Angiography;  Surgeon: Leonie Man, MD;  Location: [place] [facility];  Service: Cardiovascular;  Laterality: N/A;  . CARPAL TUNNEL RELEASE Right 11/28/2013   Procedure: RIGHT WRIST CARPAL TUNNEL RELEASE;  Surgeon: Lorn Junes, MD;  Location: [place];  Service: Orthopedics;  Laterality: Right;  . CATARACT EXTRACTION W/ INTRAOCULAR LENS  IMPLANT, BILATERAL Bilateral 1978  . COLONOSCOPY    .  CORONARY ARTERY BYPASS GRAFT N/A 10/22/2016   Procedure: CORONARY ARTERY BYPASS GRAFTING (CABG)x 2 WITH LIMA TO DIAGONAL, OPEN  HARVESTING OF RIGHT SAPHENOUS VEIN FOR VEIN GRAFT TO LAD;  Surgeon: Rexene Alberts, MD;  Location: [place];  Service: Open Heart Surgery;  Laterality: N/A;  . FRACTURE SURGERY    . INGUINAL HERNIA REPAIR Right 1998  . IR GENERIC HISTORICAL  12/07/2016   IR US GUIDE VASC ACCESS RIGHT  12/07/2016 Aletta Edouard, MD [facility]  . IR GENERIC HISTORICAL  12/07/2016   IR RADIOLOGY PERIPHERAL GUIDED IV START 12/07/2016 Aletta Edouard, MD [facility]  . IR GENERIC HISTORICAL  12/07/2016   IR GASTROSTOMY TUBE MOD SED 12/07/2016 Aletta Edouard, MD [facility]  . LIPOMA EXCISION Right 2008   "side of my head"  . MITRAL VALVE REPAIR N/A 10/22/2016   Procedure: MITRAL VALVE REPAIR (MVR) WITH SIZE 30 SORIN ANNULOFLEX ANNULOPLASTY RING WITH SUBSEQUENT REMOVAL OF RING;  Surgeon: Rexene Alberts, MD;  Location: [place];  Service: Open Heart Surgery;  Laterality: N/A;  . PENECTOMY  2007   Peyronie's disease   . PENILE PROSTHESIS IMPLANT  2009  . REMOVAL OF PENILE PROSTHESIS N/A 02/01/2017   Procedure: REMOVAL OF PENILE PROSTHESIS;  Surgeon: Kathie Rhodes, MD;  Location: [place];  Service: Urology;  Laterality: N/A;  . SHOULDER OPEN ROTATOR CUFF REPAIR Right 2006  . STERNAL CLOSURE N/A 10/26/2016   Procedure: STERNAL WASHOUT AND DELAYED PRIMARY CLOSURE;  Surgeon: Rexene Alberts, MD;  Location: [place];  Service: Thoracic;  Laterality: N/A;  . TEE WITHOUT CARDIOVERSION N/A 10/26/2016   Procedure: TRANSESOPHAGEAL ECHOCARDIOGRAM (TEE);  Surgeon: Rexene Alberts, MD;  Location: [place];  Service: Thoracic;  Laterality: N/A;  . TEE WITHOUT CARDIOVERSION N/A 10/22/2016   Procedure: TRANSESOPHAGEAL ECHOCARDIOGRAM (TEE);  Surgeon: Rexene Alberts, MD;  Location: [place];  Service: Open Heart Surgery;  Laterality: N/A;  . THORACIC AORTIC ANEURYSM REPAIR  10/22/2016   Procedure: ASCENDING AORTIC  ANEURYSM REPAIR (AAA) WITH 28 MM HEMASHIELD PLATINUM WOVEN DOUBLE VELOUR VASCULAR GRAFT;  Surgeon: Rexene Alberts, MD;  Location: [place];  Service: Open Heart Surgery;;  . TONSILLECTOMY  ~ 1955  . TRACHEOSTOMY TUBE PLACEMENT N/A 11/09/2016   Procedure: TRACHEOSTOMY;  Surgeon: Rexene Alberts, MD;  Location: [place];  Service: Thoracic;  Laterality: N/A;  . TRANSURETHRAL RESECTION OF PROSTATE  2005  . TRIGGER FINGER  RELEASE Bilateral    several lt and rt hands  . TRIGGER FINGER RELEASE Right 11/28/2013   Procedure: RIGHT TRIGGER FINGER  RELEASE (TENDON SHEATH INCISION);  Surgeon: Lorn Junes, MD;  Location: [place];  Service: Orthopedics;  Laterality: Right;  Marland Kitchen VIDEO BRONCHOSCOPY N/A 11/09/2016   Procedure: VIDEO BRONCHOSCOPY;  Surgeon: Rexene Alberts, MD;  Location: [place];  Service: Thoracic;  Laterality: N/A;  . WRIST FRACTURE SURGERY Right ~ 1959    There were no vitals filed for this visit.      Subjective Assessment - 09/06/17 1100    Subjective Patient able to [place] yard with riding mower using technique instructed by PT. He had small washer in shoe & it changed his gait.    Patient is accompained by: Family member   Pertinent History arthritis, bicuspid aortic valve, BPH, carpal tunnel syndrome in R wrist, CAD, diverticulitis, GERD, gout, hyperlipidemia, HTN, DM type II,thrombocytopenia, thoracic ascending aortic aneurysm, severe aortic stenosis, amputation of bilateral hands except right thumb (12/11/2016) due to gangrene, bilateral transtibial amputations (  12/11/2016) due to gangrene, R and L heart cath (10/20/2016)    Limitations Standing;Walking;House hold activities   Patient Stated Goals use prostheses to walk including stairs, outdoors, target shooting, drive   Currently in Pain? No/denies                         [facility] Adult PT Treatment/Exercise - 09/06/17 1100      Transfers   Transfers Sit to Stand;Stand to Sit   Sit to Stand 5: Supervision;With upper extremity assist;From chair/3-in-1  chairs without armrests   Stand to Sit 5: Supervision;With upper extremity assist;To chair/3-in-1  to chairs without armrests     Ambulation/Gait   Ambulation/Gait Yes   Ambulation/Gait Assistance 4: Min assist;4: Min guard;5: Supervision   Ambulation/Gait Assistance Details tactile & verbal cues on balance reactions, wt shift, step width and posture. PT demo,  verbal cues on 90* & 180* turns with weight shift & pelvic control.    Ambulation Distance (Feet) 500 Feet  500' indoors, 200' outdoors on grass with cane   Assistive device Prostheses;None;Straight cane  straight cane on grass   Gait Pattern Step-through pattern;Decreased stride length;Left hip hike;Right hip hike;Trunk flexed;Wide base of support   Stairs Yes   Stairs Assistance 5: Supervision   Stair Management Technique Two rails;Alternating pattern;Forwards   Number of Stairs 4   Height of Stairs 4   Ramp 4: Min assist  prostheses only   Ramp Details (indicate cue type and reason) verbal & tactile cues on posture & wt shift   Curb 4: Min assist;3: Mod assist  prostheses only   Curb Details (indicate cue type and reason) verbal & tactile cues on technique, wt shfit & balance reaction.     High Level Balance   High Level Balance Activities Side stepping;Backward walking;Direction changes;Head turns;Negotitating around obstacles;Figure 8 turns;Negotiating over obstacles  prosthesis only   High Level Balance Comments verbal cues on turning 90* to right & left, technique & balance reactions     Prosthetics   Prosthetic Care Comments  reviewed changing shoes on prostheses, heel ht, shoe durometer, tightening laces.    Current prosthetic wear tolerance (days/week)  daily   Current prosthetic wear tolerance (#hours/day)  reports all awake hours but removes prostheses, leaving liners on limbs for 30 min when uncomfortable.   Residual limb condition  intact per pt   Education Provided Proper Donning;Prosthetic cleaning   Person(s) Educated Patient;Child(ren)   Education Method Explanation;Verbal cues   Education Method Verbalized understanding;Verbal cues required;Needs further instruction                  PT Short Term Goals - 08/18/17 2230      PT SHORT TERM GOAL #1   Title Patient ambulates 200' with bilateral prostheses no assistive device with supervision.    Time 4    Period Weeks   Status New   Target Date 09/17/17     PT SHORT TERM GOAL #2   Title Patient able to lift <1# object with UE prosthesis with supervision.    Time 4   Period Weeks   Status New   Target Date 09/17/17     PT SHORT TERM GOAL #3   Title Patient negotiates standard height ramps & curbs outdoors with prostheses only with supervision.    Time 1   Period Months   Status New   Target Date 09/17/17     PT SHORT TERM GOAL #4  Title Patient ambulates 100' on non-sloped grass with cane & prostheses with supervision.    Time 1   Period Months   Status New   Target Date 09/17/17           PT Long Term Goals - 08/18/17 2212      PT LONG TERM GOAL #1   Title Patient demonstrates & verbalizes understanding of proper prosthetic care including independent donning to enable safe use of LE prostheses.    Baseline MET 08/16/17   Time 2   Period Months   Status Achieved     PT LONG TERM GOAL #2   Title Patient will tolerate prostheses wear >90% of awake hours without skin issues or limb pain to demonstrate a decrease in his risk of falling.   Baseline MET 08/18/2017   Time 2   Period Months   Status Achieved     PT LONG TERM GOAL #3   Title Patient performs standing balance activities with intermittent UE support reaching 10", picking up objects from floor and looks over shoulders with weight shift, trunk rotation with prostheses independently.   Baseline Progressing 08/18/2017 Pt requires UE support to reach 10" & look over shoulders, minA to pick up object off floor.   Time 2   Period Months   Status On-going   Target Date 10/13/17     PT LONG TERM GOAL #4   Title Patient will ambulate 1000 feet including outdoor surfaces with cane and prostheses modified independently to enable community mobility.     Baseline Progressing 08/16/17 Pt ambulates 1000' but requires supervison >300' with cane and minA on grass/modA on grass slope;  On 08/18/2017 pt ambulates up to 400'  without assistive device TTA prostheses only with intermittent MinA for balance loss.   Time 2   Period Months   Status On-going   Target Date 10/13/17     PT LONG TERM GOAL #5   Title Patient will negotiate ramp/curbs without assistive device with TTA prostheses only and stairs with 1 rail and prostheses modified independent for community access.    Time 2   Period Months   Status Revised   Target Date 10/13/17     PT LONG TERM GOAL #6   Title Patient ambulates 100' household around furniture and 300' on paved surfaces with prostheses only modified independent.    Time 2   Period Months   Status New   Target Date 10/13/17     PT LONG TERM GOAL #7   Title Patient reports Activities of Balance [place] score using FOTO >25% to indicate greater confidence in his balance. (Target Date: 08/20/2017)   Time 2   Period Months   Status On-going   Target Date 10/13/17     PT LONG TERM GOAL #8   Title Patient able to lift objects with UE prosthesis from floor modified independent.    Time 2   Period Months   Status New   Target Date 10/13/17               Plan - 09/06/17 2214    Clinical Impression Statement Patient improved direction changes with skilled instruction in weight shift with changing foot direction with pelvis with unweighted. Patient verbalizes improved function outside of PT with instruction that he continues to recieve.    Rehab Potential Good   Clinical Impairments Affecting Rehab Potential arthritis, bicuspid aortic valve, BPH, carpal tunnel syndrome in R wrist, CAD, diverticulitis, GERD, gout, hyperlipidemia, HTN, DM type  II,thrombocytopenia, thoracic ascending aortic aneurysm, severe aortic stenosis, amputation of bilateral hands except right thumb (12/11/2016) due to gangrene, bilateral transtibial amputations (12/11/2016) due to gangrene, R and L heart cath (10/20/2016)    PT Frequency 2x / week   PT Duration 8 weeks   PT Treatment/Interventions ADLs/Self  Care Home Management;Neuromuscular re-education;Balance training;Therapeutic exercise;Therapeutic activities;Functional mobility training;Stair training;Gait training;DME Instruction;Patient/family education;Prosthetic Training   PT Next Visit Plan prosthetic gait basic community skills including ramps/curbs with prostheses only and fuller community including grass with cane.    Consulted and Agree with Plan of Care Patient;Family member/caregiver   Family Member Consulted dtr      Patient will benefit from skilled therapeutic intervention in order to improve the following deficits and impairments:  Abnormal gait, Decreased activity tolerance, Decreased balance, Decreased coordination, Decreased range of motion, Decreased mobility, Decreased knowledge of use of DME, Decreased knowledge of precautions, Decreased endurance, Decreased skin integrity, Decreased scar mobility, Decreased strength, Difficulty walking, Postural dysfunction, Prosthetic Dependency  Visit Diagnosis: Unsteadiness on feet  Other abnormalities of gait and mobility  Muscle weakness (generalized)  Other symptoms and signs involving the musculoskeletal system  Abnormal posture     Problem List Patient Active Problem List   Diagnosis Date Noted  . Amputation of both hands with complication, subsequent encounter 03/01/2017  . Diabetes ([place]) 02/19/2017  . Status post bilateral below knee amputation ([place]) 02/09/2017  . Wound disruption, post-op, skin, sequela 02/09/2017  . Debility 02/05/2017  . Ganglion upper arm, right   . Thrombosis of both upper extremities   . Suspected heparin induced thrombocytopenia (HIT) in hospitalized patient ([place])   . Gangrene of lower extremity ([place])   . Heparin induced thrombocytopenia (HIT) ([place])   . Tracheostomy status ([place])   . Chest tube in place   . Atherosclerosis of native arteries of extremities with gangrene, left leg ([place])   . Atherosclerosis of native arteries of  extremities with gangrene, right leg ([place])   . Acute on chronic diastolic CHF (congestive heart failure) ([place])   . Enteritis due to Clostridium difficile   . Anasarca   . FUO (fever of unknown origin)   . Acute encephalopathy   . Elevated LFTs   . DIC (disseminated intravascular coagulation) ([place]) 10/28/2016  . Postoperative atrial fibrillation ([place]) 10/24/2016  . Cardiogenic shock ([place])   . Mitral regurgitation due to cusp prolapse 10/22/2016  . S/P aortic valve replacement with bioprosthetic valve 10/22/2016  . S/P ascending aortic aneurysm repair 10/22/2016  . S/P mitral valve repair 10/22/2016  . S/P CABG x 3 10/22/2016  . Thrombocytopenia ([place]) 10/22/2016  . Acute combined systolic and diastolic congestive heart failure ([place]) 10/22/2016  . Acute respiratory failure ([place]) 10/22/2016  . Thoracic ascending aortic aneurysm ([place]) 10/21/2016  . Coronary artery disease involving native coronary artery of native heart with unstable angina pectoris ([place]) 10/20/2016  . Severe aortic stenosis by prior echocardiography 10/19/2016  . Unstable angina ([place]) 10/19/2016  . Aortic valve stenosis 10/19/2016  . Bicuspid aortic valve 10/19/2016  . Trigger ring finger of right hand   . Wears glasses   . Gout   . Hypertension   . Carpal tunnel syndrome of right wrist   . Arthritis   . Snores     Khalis Hittle PT, DPT 09/06/2017, 10:18 PM  [place]  [address] [place], [place], [address] Phone: [phone]   Fax:  [phone]  Name: Scott [name] MRN: [id number] Date of Birth: 09/25/1946

## 2017-09-09 ENCOUNTER — Other Ambulatory Visit: Payer: Self-pay | Admitting: Cardiology

## 2017-09-13 ENCOUNTER — Ambulatory Visit: Payer: Medicare Other | Admitting: Physical Therapy

## 2017-09-13 ENCOUNTER — Encounter: Payer: Self-pay | Admitting: Physical Therapy

## 2017-09-13 DIAGNOSIS — R2681 Unsteadiness on feet: Secondary | ICD-10-CM

## 2017-09-13 DIAGNOSIS — R293 Abnormal posture: Secondary | ICD-10-CM

## 2017-09-13 DIAGNOSIS — R2689 Other abnormalities of gait and mobility: Secondary | ICD-10-CM | POA: Diagnosis not present

## 2017-09-13 DIAGNOSIS — M6281 Muscle weakness (generalized): Secondary | ICD-10-CM

## 2017-09-13 DIAGNOSIS — R29898 Other symptoms and signs involving the musculoskeletal system: Secondary | ICD-10-CM

## 2017-09-13 NOTE — Therapy (Signed)
[facility] [address] [place], [place], [address] Phone: [phone]   Fax:  [phone]  Physical Therapy Treatment  Patient Details  Name: Scott [name] MRN: [id number] Date of Birth: 1946/10/11 Referring Provider: Meridee Score MD   Encounter Date: 09/13/2017      PT End of Session - 09/13/17 1223    Visit Number 37   Number of Visits 41   Date for PT Re-Evaluation 10/13/17   Authorization Type Medicare & G-codes every 10th visit    PT Start Time 1100   PT Stop Time 1144   PT Time Calculation (min) 44 min   Equipment Utilized During Treatment Gait belt   Activity Tolerance Patient tolerated treatment well   Behavior During Therapy [facility] for tasks assessed/performed      Past Medical History:  Diagnosis Date  . Arthritis    "hips, shoulders; knees; back" (10/20/2016)  . Bicuspid aortic valve   . BPH (benign prostatic hypertrophy)   . Carpal tunnel syndrome of right wrist   . Coronary artery disease involving native coronary artery of native heart with unstable angina pectoris ([place]) 10/20/2016  . Diverticulitis   . GERD (gastroesophageal reflux disease)   . Gout   . Heart murmur   . Hyperlipemia   . Hypertension   . Postoperative atrial fibrillation ([place]) 10/24/2016  . RLS (restless legs syndrome)   . S/P aortic valve replacement with bioprosthetic valve 10/22/2016   25 mm [facility] Ease bovine pericardial bioprosthetic tissue valve  . S/P ascending aortic aneurysm repair 10/22/2016   28 mm supracoronary straight graft replacement of ascending thoracic aortic aneurysm  . S/P CABG x 3 10/22/2016   Sequential LIMA to Diag and LAD, SVG to distal LAD, open vein harvest right thigh  . S/P mitral valve repair 10/22/2016   Artificial Gore-tex neochord placement x6 - posterior annuloplasty band placed but removed due to systolic anterior motion of mitral valve  . Severe aortic stenosis   . Snores     Never been tested for sleep apnea  . Thoracic ascending aortic aneurysm ([place]) 10/21/2016  . Thrombocytopenia ([place]) 10/22/2016  . Type II diabetes mellitus ([place])     Past Surgical History:  Procedure Laterality Date  . AMPUTATION Bilateral 12/11/2016   Procedure: AMPUTATION BELOW KNEE BILATERALLY;  Surgeon: [name], MD;  Location: [place];  Service: Orthopedics;  Laterality: Bilateral;  . AMPUTATION Bilateral 12/11/2016   Procedure: AMPUTATION BILATERAL HANDS EXCEPT RIGHT THUMB;  Surgeon: [name], MD;  Location: [place];  Service: Orthopedics;  Laterality: Bilateral;  . AORTIC VALVE REPLACEMENT N/A 10/22/2016   Procedure: AORTIC VALVE REPLACEMENT (AVR) WITH SIZE 25 MM MAGNA EASE PERICARDIAL BIOPROSTHESIS - AORTIC;  Surgeon: Rexene Alberts, MD;  Location: [place];  Service: Open Heart Surgery;  Laterality: N/A;  . BONE EXCISION Right 02/01/2017   Procedure: EXCISION RIGHT INDEX METACARPAL HEAD;  Surgeon: [name], MD;  Location: [place];  Service: Orthopedics;  Laterality: Right;  . CARDIAC CATHETERIZATION N/A 10/20/2016   Procedure: Right/Left Heart Cath and Coronary Angiography;  Surgeon: Leonie Man, MD;  Location: [place] [facility];  Service: Cardiovascular;  Laterality: N/A;  . CARPAL TUNNEL RELEASE Right 11/28/2013   Procedure: RIGHT WRIST CARPAL TUNNEL RELEASE;  Surgeon: Lorn Junes, MD;  Location: [place];  Service: Orthopedics;  Laterality: Right;  . CATARACT EXTRACTION W/ INTRAOCULAR LENS  IMPLANT, BILATERAL Bilateral 1978  . COLONOSCOPY    .  CORONARY ARTERY BYPASS GRAFT N/A 10/22/2016   Procedure: CORONARY ARTERY BYPASS GRAFTING (CABG)x 2 WITH LIMA TO DIAGONAL, OPEN  HARVESTING OF RIGHT SAPHENOUS VEIN FOR VEIN GRAFT TO LAD;  Surgeon: Rexene Alberts, MD;  Location: [place];  Service: Open Heart Surgery;  Laterality: N/A;  . FRACTURE SURGERY    . INGUINAL HERNIA REPAIR Right 1998  . IR GENERIC HISTORICAL  12/07/2016   IR US GUIDE VASC ACCESS RIGHT  12/07/2016 Aletta Edouard, MD [facility]  . IR GENERIC HISTORICAL  12/07/2016   IR RADIOLOGY PERIPHERAL GUIDED IV START 12/07/2016 Aletta Edouard, MD [facility]  . IR GENERIC HISTORICAL  12/07/2016   IR GASTROSTOMY TUBE MOD SED 12/07/2016 Aletta Edouard, MD [facility]  . LIPOMA EXCISION Right 2008   "side of my head"  . MITRAL VALVE REPAIR N/A 10/22/2016   Procedure: MITRAL VALVE REPAIR (MVR) WITH SIZE 30 SORIN ANNULOFLEX ANNULOPLASTY RING WITH SUBSEQUENT REMOVAL OF RING;  Surgeon: Rexene Alberts, MD;  Location: [place];  Service: Open Heart Surgery;  Laterality: N/A;  . PENECTOMY  2007   Peyronie's disease   . PENILE PROSTHESIS IMPLANT  2009  . REMOVAL OF PENILE PROSTHESIS N/A 02/01/2017   Procedure: REMOVAL OF PENILE PROSTHESIS;  Surgeon: Kathie Rhodes, MD;  Location: [place];  Service: Urology;  Laterality: N/A;  . SHOULDER OPEN ROTATOR CUFF REPAIR Right 2006  . STERNAL CLOSURE N/A 10/26/2016   Procedure: STERNAL WASHOUT AND DELAYED PRIMARY CLOSURE;  Surgeon: Rexene Alberts, MD;  Location: [place];  Service: Thoracic;  Laterality: N/A;  . TEE WITHOUT CARDIOVERSION N/A 10/26/2016   Procedure: TRANSESOPHAGEAL ECHOCARDIOGRAM (TEE);  Surgeon: Rexene Alberts, MD;  Location: [place];  Service: Thoracic;  Laterality: N/A;  . TEE WITHOUT CARDIOVERSION N/A 10/22/2016   Procedure: TRANSESOPHAGEAL ECHOCARDIOGRAM (TEE);  Surgeon: Rexene Alberts, MD;  Location: [place];  Service: Open Heart Surgery;  Laterality: N/A;  . THORACIC AORTIC ANEURYSM REPAIR  10/22/2016   Procedure: ASCENDING AORTIC  ANEURYSM REPAIR (AAA) WITH 28 MM HEMASHIELD PLATINUM WOVEN DOUBLE VELOUR VASCULAR GRAFT;  Surgeon: Rexene Alberts, MD;  Location: [place];  Service: Open Heart Surgery;;  . TONSILLECTOMY  ~ 1955  . TRACHEOSTOMY TUBE PLACEMENT N/A 11/09/2016   Procedure: TRACHEOSTOMY;  Surgeon: Rexene Alberts, MD;  Location: [place];  Service: Thoracic;  Laterality: N/A;  . TRANSURETHRAL RESECTION OF PROSTATE  2005  . TRIGGER FINGER  RELEASE Bilateral    several lt and rt hands  . TRIGGER FINGER RELEASE Right 11/28/2013   Procedure: RIGHT TRIGGER FINGER  RELEASE (TENDON SHEATH INCISION);  Surgeon: Lorn Junes, MD;  Location: [place];  Service: Orthopedics;  Laterality: Right;  Marland Kitchen VIDEO BRONCHOSCOPY N/A 11/09/2016   Procedure: VIDEO BRONCHOSCOPY;  Surgeon: Rexene Alberts, MD;  Location: [place];  Service: Thoracic;  Laterality: N/A;  . WRIST FRACTURE SURGERY Right ~ 1959    There were no vitals filed for this visit.      Subjective Assessment - 09/13/17 1106    Subjective He has been shopping using cart & let go to retrieve items. He walks up to 1.5 hrs without issues.    Patient is accompained by: Family member   Pertinent History arthritis, bicuspid aortic valve, BPH, carpal tunnel syndrome in R wrist, CAD, diverticulitis, GERD, gout, hyperlipidemia, HTN, DM type II,thrombocytopenia, thoracic ascending aortic aneurysm, severe aortic stenosis, amputation of bilateral hands except right thumb (12/11/2016) due to gangrene, bilateral transtibial amputations (12/11/2016) due to gangrene,  R and L heart cath (10/20/2016)    Limitations Standing;Walking;House hold activities   Patient Stated Goals use prostheses to walk including stairs, outdoors, target shooting, drive   Currently in Pain? No/denies                         [facility] Adult PT Treatment/Exercise - 09/13/17 1100      Transfers   Floor to Transfer 6: Modified independent (Device/Increase time);With upper extremity assist  pushing on chair bottom     Ambulation/Gait   Ambulation/Gait Yes   Ambulation/Gait Assistance 5: Supervision   Ambulation/Gait Assistance Details PT demo & verbal cues on narrowing step width to ~4". Pt negotiating around obstacles, direction changes, scanning, increasing pace to cross street.    Ambulation Distance (Feet) 300 Feet  300' indoors no device & 300' outdoors cane on grass   Assistive device  Prostheses;None;Straight cane  cane for grass & gravel only   Gait Pattern Step-through pattern;Decreased stride length;Left hip hike;Right hip hike;Trunk flexed;Wide base of support   Ambulation Surface Indoor;Level;Outdoor;Unlevel;Paved;Gravel;Grass;Other (comment)  grass hill, PT demo sidestepping when too steep   Ramp 5: Supervision  prostheses only   Curb 5: Supervision  prostheses only   Curb Details (indicate cue type and reason) verbal cues on foot position, step thru and alternating lead limb.      High Level Balance   High Level Balance Activities Side stepping;Backward walking;Direction changes;Head turns;Negotitating around obstacles   High Level Balance Comments verbal cues on turning 90* to right & left, technique & balance reactions     Self-Care   ADL's PT used internet for modified grips for rake, shovel etc (Macen's grip) and use of UE prostheses to aid with yard work.    Lifting Pt able to lift 1# container from floor with supervision and ambulate 30' carrying item without AD.                   PT Short Term Goals - 09/13/17 1223      PT SHORT TERM GOAL #1   Title Patient ambulates 200' with bilateral prostheses no assistive device with supervision.    Baseline MET 09/13/17   Time 4   Period Weeks   Status Achieved     PT SHORT TERM GOAL #2   Title Patient able to lift <1# object with UE prosthesis with supervision.    Baseline MET 09/13/17   Time 4   Period Weeks   Status Achieved     PT SHORT TERM GOAL #3   Title Patient negotiates standard height ramps & curbs outdoors with prostheses only with supervision.    Baseline MET 09/13/17   Time 1   Period Months   Status Achieved     PT SHORT TERM GOAL #4   Title Patient ambulates 100' on non-sloped grass with cane & prostheses with supervision.    Baseline MET 09/13/17   Time 1   Period Months   Status Achieved           PT Long Term Goals - 08/18/17 2212      PT LONG TERM GOAL #1    Title Patient demonstrates & verbalizes understanding of proper prosthetic care including independent donning to enable safe use of LE prostheses.    Baseline MET 08/16/17   Time 2   Period Months   Status Achieved     PT LONG TERM GOAL #2   Title Patient will tolerate prostheses  wear >90% of awake hours without skin issues or limb pain to demonstrate a decrease in his risk of falling.   Baseline MET 08/18/2017   Time 2   Period Months   Status Achieved     PT LONG TERM GOAL #3   Title Patient performs standing balance activities with intermittent UE support reaching 10", picking up objects from floor and looks over shoulders with weight shift, trunk rotation with prostheses independently.   Baseline Progressing 08/18/2017 Pt requires UE support to reach 10" & look over shoulders, minA to pick up object off floor.   Time 2   Period Months   Status On-going   Target Date 10/13/17     PT LONG TERM GOAL #4   Title Patient will ambulate 1000 feet including outdoor surfaces with cane and prostheses modified independently to enable community mobility.     Baseline Progressing 08/16/17 Pt ambulates 1000' but requires supervison >300' with cane and minA on grass/modA on grass slope;  On 08/18/2017 pt ambulates up to 400' without assistive device TTA prostheses only with intermittent MinA for balance loss.   Time 2   Period Months   Status On-going   Target Date 10/13/17     PT LONG TERM GOAL #5   Title Patient will negotiate ramp/curbs without assistive device with TTA prostheses only and stairs with 1 rail and prostheses modified independent for community access.    Time 2   Period Months   Status Revised   Target Date 10/13/17     PT LONG TERM GOAL #6   Title Patient ambulates 100' household around furniture and 300' on paved surfaces with prostheses only modified independent.    Time 2   Period Months   Status New   Target Date 10/13/17     PT LONG TERM GOAL #7   Title Patient  reports Activities of Balance Confindence score using FOTO >25% to indicate greater confidence in his balance. (Target Date: 08/20/2017)   Time 2   Period Months   Status On-going   Target Date 10/13/17     PT LONG TERM GOAL #8   Title Patient able to lift objects with UE prosthesis from floor modified independent.    Time 2   Period Months   Status New   Target Date 10/13/17               Plan - 09/13/17 1224    Clinical Impression Statement Patient met 4 of 4 STGs for this 30 day period. He has progressed activity level tolerating shopping for 1.5 - 2 hrs and yard work with Engineer, building services.    Rehab Potential Good   Clinical Impairments Affecting Rehab Potential arthritis, bicuspid aortic valve, BPH, carpal tunnel syndrome in R wrist, CAD, diverticulitis, GERD, gout, hyperlipidemia, HTN, DM type II,thrombocytopenia, thoracic ascending aortic aneurysm, severe aortic stenosis, amputation of bilateral hands except right thumb (12/11/2016) due to gangrene, bilateral transtibial amputations (12/11/2016) due to gangrene, R and L heart cath (10/20/2016)    PT Frequency 2x / week   PT Duration 8 weeks   PT Treatment/Interventions ADLs/Self Care Home Management;Neuromuscular re-education;Balance training;Therapeutic exercise;Therapeutic activities;Functional mobility training;Stair training;Gait training;DME Instruction;Patient/family education;Prosthetic Training   PT Next Visit Plan continue towards LTGs   Consulted and Agree with Plan of Care Patient;Family member/caregiver   Family Member Consulted wife      Patient will benefit from skilled therapeutic intervention in order to improve the following deficits and impairments:  Abnormal gait, Decreased activity  tolerance, Decreased balance, Decreased coordination, Decreased range of motion, Decreased mobility, Decreased knowledge of use of DME, Decreased knowledge of precautions, Decreased endurance, Decreased skin integrity, Decreased scar  mobility, Decreased strength, Difficulty walking, Postural dysfunction, Prosthetic Dependency  Visit Diagnosis: Unsteadiness on feet  Other abnormalities of gait and mobility  Muscle weakness (generalized)  Other symptoms and signs involving the musculoskeletal system  Abnormal posture     Problem List Patient Active Problem List   Diagnosis Date Noted  . Amputation of both hands with complication, subsequent encounter 03/01/2017  . Diabetes ([place]) 02/19/2017  . Status post bilateral below knee amputation ([place]) 02/09/2017  . Wound disruption, post-op, skin, sequela 02/09/2017  . Debility 02/05/2017  . Ganglion upper arm, right   . Thrombosis of both upper extremities   . Suspected heparin induced thrombocytopenia (HIT) in hospitalized patient ([place])   . Gangrene of lower extremity ([place])   . Heparin induced thrombocytopenia (HIT) ([place])   . Tracheostomy status ([place])   . Chest tube in place   . Atherosclerosis of native arteries of extremities with gangrene, left leg ([place])   . Atherosclerosis of native arteries of extremities with gangrene, right leg ([place])   . Acute on chronic diastolic CHF (congestive heart failure) ([place])   . Enteritis due to Clostridium difficile   . Anasarca   . FUO (fever of unknown origin)   . Acute encephalopathy   . Elevated LFTs   . DIC (disseminated intravascular coagulation) ([place]) 10/28/2016  . Postoperative atrial fibrillation ([place]) 10/24/2016  . Cardiogenic shock ([place])   . Mitral regurgitation due to cusp prolapse 10/22/2016  . S/P aortic valve replacement with bioprosthetic valve 10/22/2016  . S/P ascending aortic aneurysm repair 10/22/2016  . S/P mitral valve repair 10/22/2016  . S/P CABG x 3 10/22/2016  . Thrombocytopenia ([place]) 10/22/2016  . Acute combined systolic and diastolic congestive heart failure ([place]) 10/22/2016  . Acute respiratory failure ([place]) 10/22/2016  . Thoracic ascending aortic aneurysm ([place]) 10/21/2016  . Coronary artery  disease involving native coronary artery of native heart with unstable angina pectoris ([place]) 10/20/2016  . Severe aortic stenosis by prior echocardiography 10/19/2016  . Unstable angina ([place]) 10/19/2016  . Aortic valve stenosis 10/19/2016  . Bicuspid aortic valve 10/19/2016  . Trigger ring finger of right hand   . Wears glasses   . Gout   . Hypertension   . Carpal tunnel syndrome of right wrist   . Arthritis   . Snores     Jaquelinne Glendening PT, DPT 09/13/2017, 12:27 PM  [facility] [address] [place], [place], [address] Phone: [phone]   Fax:  [phone]  Name: FLORA RATZ MRN: [id number] Date of Birth: [birth year]

## 2017-09-14 ENCOUNTER — Ambulatory Visit (INDEPENDENT_AMBULATORY_CARE_PROVIDER_SITE_OTHER): Payer: Medicare Other | Admitting: *Deleted

## 2017-09-14 DIAGNOSIS — I82603 Acute embolism and thrombosis of unspecified veins of upper extremity, bilateral: Secondary | ICD-10-CM | POA: Diagnosis not present

## 2017-09-14 DIAGNOSIS — Q231 Congenital insufficiency of aortic valve: Secondary | ICD-10-CM

## 2017-09-14 DIAGNOSIS — Z9889 Other specified postprocedural states: Secondary | ICD-10-CM | POA: Diagnosis not present

## 2017-09-14 DIAGNOSIS — I9789 Other postprocedural complications and disorders of the circulatory system, not elsewhere classified: Secondary | ICD-10-CM

## 2017-09-14 DIAGNOSIS — I4891 Unspecified atrial fibrillation: Secondary | ICD-10-CM | POA: Diagnosis not present

## 2017-09-14 DIAGNOSIS — Z5181 Encounter for therapeutic drug level monitoring: Secondary | ICD-10-CM

## 2017-09-14 LAB — POCT INR: INR: 3.1

## 2017-09-20 ENCOUNTER — Ambulatory Visit: Payer: Medicare Other | Admitting: Physical Therapy

## 2017-09-20 ENCOUNTER — Encounter: Payer: Self-pay | Admitting: Physical Therapy

## 2017-09-20 DIAGNOSIS — M6281 Muscle weakness (generalized): Secondary | ICD-10-CM

## 2017-09-20 DIAGNOSIS — R29898 Other symptoms and signs involving the musculoskeletal system: Secondary | ICD-10-CM

## 2017-09-20 DIAGNOSIS — R2681 Unsteadiness on feet: Secondary | ICD-10-CM | POA: Diagnosis not present

## 2017-09-20 DIAGNOSIS — R2689 Other abnormalities of gait and mobility: Secondary | ICD-10-CM | POA: Diagnosis not present

## 2017-09-20 DIAGNOSIS — R293 Abnormal posture: Secondary | ICD-10-CM

## 2017-09-21 NOTE — Therapy (Signed)
[facility] [address] [place], [place], [address] Phone: [phone]   Fax:  [phone]  Physical Therapy Treatment  Patient Details  Name: Scott [name] MRN: [id number] Date of Birth: 1946/05/26 Referring Provider: Meridee Score MD   Encounter Date: 09/20/2017      PT End of Session - 09/20/17 1523    Visit Number 38   Number of Visits 41   Date for PT Re-Evaluation 10/13/17   Authorization Type Medicare & G-codes every 10th visit    PT Start Time 1100   PT Stop Time 1144   PT Time Calculation (min) 44 min   Equipment Utilized During Treatment Gait belt   Activity Tolerance Patient tolerated treatment well   Behavior During Therapy [facility] for tasks assessed/performed      Past Medical History:  Diagnosis Date  . Arthritis    "hips, shoulders; knees; back" (10/20/2016)  . Bicuspid aortic valve   . BPH (benign prostatic hypertrophy)   . Carpal tunnel syndrome of right wrist   . Coronary artery disease involving native coronary artery of native heart with unstable angina pectoris ([place]) 10/20/2016  . Diverticulitis   . GERD (gastroesophageal reflux disease)   . Gout   . Heart murmur   . Hyperlipemia   . Hypertension   . Postoperative atrial fibrillation ([place]) 10/24/2016  . RLS (restless legs syndrome)   . S/P aortic valve replacement with bioprosthetic valve 10/22/2016   25 mm [facility] Ease bovine pericardial bioprosthetic tissue valve  . S/P ascending aortic aneurysm repair 10/22/2016   28 mm supracoronary straight graft replacement of ascending thoracic aortic aneurysm  . S/P CABG x 3 10/22/2016   Sequential LIMA to Diag and LAD, SVG to distal LAD, open vein harvest right thigh  . S/P mitral valve repair 10/22/2016   Artificial Gore-tex neochord placement x6 - posterior annuloplasty band placed but removed due to systolic anterior motion of mitral valve  . Severe aortic stenosis   . Snores    Never been tested for sleep apnea  . Thoracic ascending aortic aneurysm ([place]) 10/21/2016  . Thrombocytopenia ([place]) 10/22/2016  . Type II diabetes mellitus ([place])     Past Surgical History:  Procedure Laterality Date  . AMPUTATION Bilateral 12/11/2016   Procedure: AMPUTATION BELOW KNEE BILATERALLY;  Surgeon: [name], MD;  Location: [place];  Service: Orthopedics;  Laterality: Bilateral;  . AMPUTATION Bilateral 12/11/2016   Procedure: AMPUTATION BILATERAL HANDS EXCEPT RIGHT THUMB;  Surgeon: [name], MD;  Location: [place];  Service: Orthopedics;  Laterality: Bilateral;  . AORTIC VALVE REPLACEMENT N/A 10/22/2016   Procedure: AORTIC VALVE REPLACEMENT (AVR) WITH SIZE 25 MM MAGNA EASE PERICARDIAL BIOPROSTHESIS - AORTIC;  Surgeon: Rexene Alberts, MD;  Location: [place];  Service: Open Heart Surgery;  Laterality: N/A;  . BONE EXCISION Right 02/01/2017   Procedure: EXCISION RIGHT INDEX METACARPAL HEAD;  Surgeon: [name], MD;  Location: [place];  Service: Orthopedics;  Laterality: Right;  . CARDIAC CATHETERIZATION N/A 10/20/2016   Procedure: Right/Left Heart Cath and Coronary Angiography;  Surgeon: Leonie Man, MD;  Location: [place] [facility];  Service: Cardiovascular;  Laterality: N/A;  . CARPAL TUNNEL RELEASE Right 11/28/2013   Procedure: RIGHT WRIST CARPAL TUNNEL RELEASE;  Surgeon: Lorn Junes, MD;  Location: [place];  Service: Orthopedics;  Laterality: Right;  . CATARACT EXTRACTION W/ INTRAOCULAR LENS  IMPLANT, BILATERAL Bilateral 1978  . COLONOSCOPY    .  CORONARY ARTERY BYPASS GRAFT N/A 10/22/2016   Procedure: CORONARY ARTERY BYPASS GRAFTING (CABG)x 2 WITH LIMA TO DIAGONAL, OPEN  HARVESTING OF RIGHT SAPHENOUS VEIN FOR VEIN GRAFT TO LAD;  Surgeon: Rexene Alberts, MD;  Location: [place];  Service: Open Heart Surgery;  Laterality: N/A;  . FRACTURE SURGERY    . INGUINAL HERNIA REPAIR Right 1998  . IR GENERIC HISTORICAL  12/07/2016   IR US GUIDE VASC ACCESS RIGHT  12/07/2016 Aletta Edouard, MD [facility]  . IR GENERIC HISTORICAL  12/07/2016   IR RADIOLOGY PERIPHERAL GUIDED IV START 12/07/2016 Aletta Edouard, MD [facility]  . IR GENERIC HISTORICAL  12/07/2016   IR GASTROSTOMY TUBE MOD SED 12/07/2016 Aletta Edouard, MD [facility]  . LIPOMA EXCISION Right 2008   "side of my head"  . MITRAL VALVE REPAIR N/A 10/22/2016   Procedure: MITRAL VALVE REPAIR (MVR) WITH SIZE 30 SORIN ANNULOFLEX ANNULOPLASTY RING WITH SUBSEQUENT REMOVAL OF RING;  Surgeon: Rexene Alberts, MD;  Location: [place];  Service: Open Heart Surgery;  Laterality: N/A;  . PENECTOMY  2007   Peyronie's disease   . PENILE PROSTHESIS IMPLANT  2009  . REMOVAL OF PENILE PROSTHESIS N/A 02/01/2017   Procedure: REMOVAL OF PENILE PROSTHESIS;  Surgeon: Kathie Rhodes, MD;  Location: [place];  Service: Urology;  Laterality: N/A;  . SHOULDER OPEN ROTATOR CUFF REPAIR Right 2006  . STERNAL CLOSURE N/A 10/26/2016   Procedure: STERNAL WASHOUT AND DELAYED PRIMARY CLOSURE;  Surgeon: Rexene Alberts, MD;  Location: [place];  Service: Thoracic;  Laterality: N/A;  . TEE WITHOUT CARDIOVERSION N/A 10/26/2016   Procedure: TRANSESOPHAGEAL ECHOCARDIOGRAM (TEE);  Surgeon: Rexene Alberts, MD;  Location: [place];  Service: Thoracic;  Laterality: N/A;  . TEE WITHOUT CARDIOVERSION N/A 10/22/2016   Procedure: TRANSESOPHAGEAL ECHOCARDIOGRAM (TEE);  Surgeon: Rexene Alberts, MD;  Location: [place];  Service: Open Heart Surgery;  Laterality: N/A;  . THORACIC AORTIC ANEURYSM REPAIR  10/22/2016   Procedure: ASCENDING AORTIC  ANEURYSM REPAIR (AAA) WITH 28 MM HEMASHIELD PLATINUM WOVEN DOUBLE VELOUR VASCULAR GRAFT;  Surgeon: Rexene Alberts, MD;  Location: [place];  Service: Open Heart Surgery;;  . TONSILLECTOMY  ~ 1955  . TRACHEOSTOMY TUBE PLACEMENT N/A 11/09/2016   Procedure: TRACHEOSTOMY;  Surgeon: Rexene Alberts, MD;  Location: [place];  Service: Thoracic;  Laterality: N/A;  . TRANSURETHRAL RESECTION OF PROSTATE  2005  . TRIGGER FINGER  RELEASE Bilateral    several lt and rt hands  . TRIGGER FINGER RELEASE Right 11/28/2013   Procedure: RIGHT TRIGGER FINGER  RELEASE (TENDON SHEATH INCISION);  Surgeon: Lorn Junes, MD;  Location: [place];  Service: Orthopedics;  Laterality: Right;  Marland Kitchen VIDEO BRONCHOSCOPY N/A 11/09/2016   Procedure: VIDEO BRONCHOSCOPY;  Surgeon: Rexene Alberts, MD;  Location: [place];  Service: Thoracic;  Laterality: N/A;  . WRIST FRACTURE SURGERY Right ~ 1959    There were no vitals filed for this visit.      Subjective Assessment - 09/20/17 1112    Subjective He would like to return to driving. No falls.    Patient is accompained by: Family member   Pertinent History arthritis, bicuspid aortic valve, BPH, carpal tunnel syndrome in R wrist, CAD, diverticulitis, GERD, gout, hyperlipidemia, HTN, DM type II,thrombocytopenia, thoracic ascending aortic aneurysm, severe aortic stenosis, amputation of bilateral hands except right thumb (12/11/2016) due to gangrene, bilateral transtibial amputations (12/11/2016) due to gangrene, R and L heart cath (10/20/2016)    Limitations Standing;Walking;House  hold activities   Patient Stated Goals use prostheses to walk including stairs, outdoors, target shooting, drive   Currently in Pain? No/denies       Prosthetic Training with bilateral Transtibial Amputation prostheses Patient & wife report skin issues with residual limbs. PT assessed bilateral residual limbs. Left has callous forming with dry skin, loss of hair at distal tibia and 86m opening distal tibia medial area with no infection signs, incision with redness no opening. Right no open areas, pre-callous forming at distal tibia. Patient ambulated 1000' indoors & outdoors with prostheses only working on scanning while maintaining path & pace, negotiating over & around obstacles, sidestepping & backwards gait, ambulating with eyes closed to simulate walking in dark with tactile & verbal cues.  Patient  negotiated ramp with supervision & curb with min guard with prostheses only with verbal cues.  Pt ambulated on grass hill with cane & prostheses side stepping with steep incline with min guard & verbal cues.                            PT Education - 09/20/17 1100    Education provided Yes   Education Details driving with prostheses, socket revisions,    Person(s) Educated Patient;Spouse   Methods Explanation   Comprehension Verbalized understanding;Verbal cues required          PT Short Term Goals - 09/13/17 1223      PT SHORT TERM GOAL #1   Title Patient ambulates 200' with bilateral prostheses no assistive device with supervision.    Baseline MET 09/13/17   Time 4   Period Weeks   Status Achieved     PT SHORT TERM GOAL #2   Title Patient able to lift <1# object with UE prosthesis with supervision.    Baseline MET 09/13/17   Time 4   Period Weeks   Status Achieved     PT SHORT TERM GOAL #3   Title Patient negotiates standard height ramps & curbs outdoors with prostheses only with supervision.    Baseline MET 09/13/17   Time 1   Period Months   Status Achieved     PT SHORT TERM GOAL #4   Title Patient ambulates 100' on non-sloped grass with cane & prostheses with supervision.    Baseline MET 09/13/17   Time 1   Period Months   Status Achieved           PT Long Term Goals - 08/18/17 2212      PT LONG TERM GOAL #1   Title Patient demonstrates & verbalizes understanding of proper prosthetic care including independent donning to enable safe use of LE prostheses.    Baseline MET 08/16/17   Time 2   Period Months   Status Achieved     PT LONG TERM GOAL #2   Title Patient will tolerate prostheses wear >90% of awake hours without skin issues or limb pain to demonstrate a decrease in his risk of falling.   Baseline MET 08/18/2017   Time 2   Period Months   Status Achieved     PT LONG TERM GOAL #3   Title Patient performs standing balance  activities with intermittent UE support reaching 10", picking up objects from floor and looks over shoulders with weight shift, trunk rotation with prostheses independently.   Baseline Progressing 08/18/2017 Pt requires UE support to reach 10" & look over shoulders, minA to pick up object off floor.  Time 2   Period Months   Status On-going   Target Date 10/13/17     PT LONG TERM GOAL #4   Title Patient will ambulate 1000 feet including outdoor surfaces with cane and prostheses modified independently to enable community mobility.     Baseline Progressing 08/16/17 Pt ambulates 1000' but requires supervison >300' with cane and minA on grass/modA on grass slope;  On 08/18/2017 pt ambulates up to 400' without assistive device TTA prostheses only with intermittent MinA for balance loss.   Time 2   Period Months   Status On-going   Target Date 10/13/17     PT LONG TERM GOAL #5   Title Patient will negotiate ramp/curbs without assistive device with TTA prostheses only and stairs with 1 rail and prostheses modified independent for community access.    Time 2   Period Months   Status Revised   Target Date 10/13/17     PT LONG TERM GOAL #6   Title Patient ambulates 100' household around furniture and 300' on paved surfaces with prostheses only modified independent.    Time 2   Period Months   Status New   Target Date 10/13/17     PT LONG TERM GOAL #7   Title Patient reports Activities of Balance Confindence score using FOTO >25% to indicate greater confidence in his balance. (Target Date: 08/20/2017)   Time 2   Period Months   Status On-going   Target Date 10/13/17     PT LONG TERM GOAL #8   Title Patient able to lift objects with UE prosthesis from floor modified independent.    Time 2   Period Months   Status New   Target Date 10/13/17               Plan - 09/20/17 1524    Clinical Impression Statement Patient appears will need socket revisions soon. He is wearing multple  socks and limbs have calloused due to higher weight bearing areas. Pt is on target to meet LTGs by 10/13/17.    Rehab Potential Good   Clinical Impairments Affecting Rehab Potential arthritis, bicuspid aortic valve, BPH, carpal tunnel syndrome in R wrist, CAD, diverticulitis, GERD, gout, hyperlipidemia, HTN, DM type II,thrombocytopenia, thoracic ascending aortic aneurysm, severe aortic stenosis, amputation of bilateral hands except right thumb (12/11/2016) due to gangrene, bilateral transtibial amputations (12/11/2016) due to gangrene, R and L heart cath (10/20/2016)    PT Frequency 2x / week   PT Duration 8 weeks   PT Treatment/Interventions ADLs/Self Care Home Management;Neuromuscular re-education;Balance training;Therapeutic exercise;Therapeutic activities;Functional mobility training;Stair training;Gait training;DME Instruction;Patient/family education;Prosthetic Training   PT Next Visit Plan continue towards LTGs   Consulted and Agree with Plan of Care Patient;Family member/caregiver   Family Member Consulted wife      Patient will benefit from skilled therapeutic intervention in order to improve the following deficits and impairments:  Abnormal gait, Decreased activity tolerance, Decreased balance, Decreased coordination, Decreased range of motion, Decreased mobility, Decreased knowledge of use of DME, Decreased knowledge of precautions, Decreased endurance, Decreased skin integrity, Decreased scar mobility, Decreased strength, Difficulty walking, Postural dysfunction, Prosthetic Dependency  Visit Diagnosis: Unsteadiness on feet  Other abnormalities of gait and mobility  Muscle weakness (generalized)  Other symptoms and signs involving the musculoskeletal system  Abnormal posture     Problem List Patient Active Problem List   Diagnosis Date Noted  . Amputation of both hands with complication, subsequent encounter 03/01/2017  . Diabetes ([place]) 02/19/2017  . Status  post bilateral  below knee amputation ([place]) 02/09/2017  . Wound disruption, post-op, skin, sequela 02/09/2017  . Debility 02/05/2017  . Ganglion upper arm, right   . Thrombosis of both upper extremities   . Suspected heparin induced thrombocytopenia (HIT) in hospitalized patient ([place])   . Gangrene of lower extremity ([place])   . Heparin induced thrombocytopenia (HIT) ([place])   . Tracheostomy status ([place])   . Chest tube in place   . Atherosclerosis of native arteries of extremities with gangrene, left leg ([place])   . Atherosclerosis of native arteries of extremities with gangrene, right leg ([place])   . Acute on chronic diastolic CHF (congestive heart failure) ([place])   . Enteritis due to Clostridium difficile   . Anasarca   . FUO (fever of unknown origin)   . Acute encephalopathy   . Elevated LFTs   . DIC (disseminated intravascular coagulation) ([place]) 10/28/2016  . Postoperative atrial fibrillation ([place]) 10/24/2016  . Cardiogenic shock ([place])   . Mitral regurgitation due to cusp prolapse 10/22/2016  . S/P aortic valve replacement with bioprosthetic valve 10/22/2016  . S/P ascending aortic aneurysm repair 10/22/2016  . S/P mitral valve repair 10/22/2016  . S/P CABG x 3 10/22/2016  . Thrombocytopenia ([place]) 10/22/2016  . Acute combined systolic and diastolic congestive heart failure ([place]) 10/22/2016  . Acute respiratory failure ([place]) 10/22/2016  . Thoracic ascending aortic aneurysm ([place]) 10/21/2016  . Coronary artery disease involving native coronary artery of native heart with unstable angina pectoris ([place]) 10/20/2016  . Severe aortic stenosis by prior echocardiography 10/19/2016  . Unstable angina ([place]) 10/19/2016  . Aortic valve stenosis 10/19/2016  . Bicuspid aortic valve 10/19/2016  . Trigger ring finger of right hand   . Wears glasses   . Gout   . Hypertension   . Carpal tunnel syndrome of right wrist   . Arthritis   . Snores     Ottis Sarnowski PT, DPT 09/21/2017, 6:31 AM  [facility] [address] [place], [place], [address] Phone: [phone]   Fax:  [phone]  Name: Scott [name] MRN: [id number] Date of Birth: 08-Oct-1946

## 2017-09-27 ENCOUNTER — Ambulatory Visit (INDEPENDENT_AMBULATORY_CARE_PROVIDER_SITE_OTHER): Payer: Medicare Other | Admitting: Orthopedic Surgery

## 2017-09-27 ENCOUNTER — Encounter: Payer: Self-pay | Admitting: Physical Therapy

## 2017-09-27 ENCOUNTER — Encounter (INDEPENDENT_AMBULATORY_CARE_PROVIDER_SITE_OTHER): Payer: Self-pay | Admitting: Orthopedic Surgery

## 2017-09-27 ENCOUNTER — Ambulatory Visit: Payer: Medicare Other | Attending: Orthopedic Surgery | Admitting: Physical Therapy

## 2017-09-27 DIAGNOSIS — R29898 Other symptoms and signs involving the musculoskeletal system: Secondary | ICD-10-CM

## 2017-09-27 DIAGNOSIS — Z89511 Acquired absence of right leg below knee: Secondary | ICD-10-CM | POA: Diagnosis not present

## 2017-09-27 DIAGNOSIS — Z89512 Acquired absence of left leg below knee: Secondary | ICD-10-CM

## 2017-09-27 DIAGNOSIS — B369 Superficial mycosis, unspecified: Secondary | ICD-10-CM | POA: Diagnosis not present

## 2017-09-27 DIAGNOSIS — R2681 Unsteadiness on feet: Secondary | ICD-10-CM

## 2017-09-27 DIAGNOSIS — R2689 Other abnormalities of gait and mobility: Secondary | ICD-10-CM | POA: Diagnosis not present

## 2017-09-27 DIAGNOSIS — R293 Abnormal posture: Secondary | ICD-10-CM | POA: Diagnosis not present

## 2017-09-27 DIAGNOSIS — I2510 Atherosclerotic heart disease of native coronary artery without angina pectoris: Secondary | ICD-10-CM

## 2017-09-27 DIAGNOSIS — M6281 Muscle weakness (generalized): Secondary | ICD-10-CM | POA: Diagnosis not present

## 2017-09-27 MED ORDER — TERBINAFINE HCL 250 MG PO TABS: 250.0000 mg | ORAL_TABLET | Freq: Every day | ORAL | 0 refills | Status: DC

## 2017-09-27 NOTE — Therapy (Signed)
[facility] [address] [place], [place], [address] Phone: [phone]   Fax:  [phone]  Physical Therapy Treatment  Patient Details  Name: Scott [name] MRN: [id number] Date of Birth: 12-30-1945 Referring Provider: Meridee Score MD    Encounter Date: 09/27/2017  PT End of Session - 09/27/17 2202    Visit Number  39    Number of Visits  41    Date for PT Re-Evaluation  10/13/17    Authorization Type  Medicare & G-codes every 10th visit     PT Start Time  1100    PT Stop Time  1148    PT Time Calculation (min)  48 min    Equipment Utilized During Treatment  Gait belt    Activity Tolerance  Patient tolerated treatment well    Behavior During Therapy  [facility] for tasks assessed/performed       Past Medical History:  Diagnosis Date  . Arthritis    "hips, shoulders; knees; back" (10/20/2016)  . Bicuspid aortic valve   . BPH (benign prostatic hypertrophy)   . Carpal tunnel syndrome of right wrist   . Coronary artery disease involving native coronary artery of native heart with unstable angina pectoris ([place]) 10/20/2016  . Diverticulitis   . GERD (gastroesophageal reflux disease)   . Gout   . Heart murmur   . Hyperlipemia   . Hypertension   . Postoperative atrial fibrillation ([place]) 10/24/2016  . RLS (restless legs syndrome)   . S/P aortic valve replacement with bioprosthetic valve 10/22/2016   25 mm [facility] Ease bovine pericardial bioprosthetic tissue valve  . S/P ascending aortic aneurysm repair 10/22/2016   28 mm supracoronary straight graft replacement of ascending thoracic aortic aneurysm  . S/P CABG x 3 10/22/2016   Sequential LIMA to Diag and LAD, SVG to distal LAD, open vein harvest right thigh  . S/P mitral valve repair 10/22/2016   Artificial Gore-tex neochord placement x6 - posterior annuloplasty band placed but removed due to systolic anterior motion of mitral valve  . Severe aortic stenosis   .  Snores    Never been tested for sleep apnea  . Thoracic ascending aortic aneurysm ([place]) 10/21/2016  . Thrombocytopenia ([place]) 10/22/2016  . Type II diabetes mellitus ([place])     Past Surgical History:  Procedure Laterality Date  . CATARACT EXTRACTION W/ INTRAOCULAR LENS  IMPLANT, BILATERAL Bilateral 1978  . COLONOSCOPY    . FRACTURE SURGERY    . INGUINAL HERNIA REPAIR Right 1998  . IR GENERIC HISTORICAL  12/07/2016   IR US GUIDE VASC ACCESS RIGHT 12/07/2016 Aletta Edouard, MD [facility]  . IR GENERIC HISTORICAL  12/07/2016   IR RADIOLOGY PERIPHERAL GUIDED IV START 12/07/2016 Aletta Edouard, MD [facility]  . IR GENERIC HISTORICAL  12/07/2016   IR GASTROSTOMY TUBE MOD SED 12/07/2016 Aletta Edouard, MD [facility]  . LIPOMA EXCISION Right 2008   "side of my head"  . PENECTOMY  2007   Peyronie's disease   . PENILE PROSTHESIS IMPLANT  2009  . SHOULDER OPEN ROTATOR CUFF REPAIR Right 2006  . TONSILLECTOMY  ~ 1955  . TRANSURETHRAL RESECTION OF PROSTATE  2005  . TRIGGER FINGER RELEASE Bilateral    several lt and rt hands  . WRIST FRACTURE SURGERY Right ~ 1959    There were no vitals filed for this visit.  Subjective Assessment - 09/27/17 1104    Subjective  He had windows in left socket which helped comfort.  Patient is accompained by:  Family member    Pertinent History  arthritis, bicuspid aortic valve, BPH, carpal tunnel syndrome in R wrist, CAD, diverticulitis, GERD, gout, hyperlipidemia, HTN, DM type II,thrombocytopenia, thoracic ascending aortic aneurysm, severe aortic stenosis, amputation of bilateral hands except right thumb (12/11/2016) due to gangrene, bilateral transtibial amputations (12/11/2016) due to gangrene, R and L heart cath (10/20/2016)     Limitations  Standing;Walking;House hold activities    Patient Stated Goals  use prostheses to walk including stairs, outdoors, target shooting, drive    Currently in Pain?  No/denies                      [facility]  Adult PT Treatment/Exercise - 09/27/17 1100      Transfers   Transfers  Sit to Stand;Stand to Sit    Sit to Stand  6: Modified independent (Device/Increase time);With upper extremity assist;From chair/3-in-1    Stand to Sit  6: Modified independent (Device/Increase time);With upper extremity assist;To chair/3-in-1    Floor to Transfer  6: Modified independent (Device/Increase time);With upper extremity assist pushing on chair bottom   pushing on chair bottom     Ambulation/Gait   Ambulation/Gait  Yes    Ambulation/Gait Assistance  5: Supervision    Ambulation Distance (Feet)  400 Feet    Assistive device  Prostheses;None cane for grass & gravel only   cane for grass & gravel only   Gait Pattern  Step-through pattern;Decreased stride length;Left hip hike;Right hip hike;Trunk flexed;Wide base of support    Ambulation Surface  Indoor;Level    Stairs  Yes    Stairs Assistance  5: Supervision    Stair Management Technique  One rail Right;One rail Left;With cane;Alternating pattern;Forwards    Number of Stairs  4 5 reps   5 reps   Ramp  5: Supervision prostheses only   prostheses only   Ramp Details (indicate cue type and reason)  verbal cues on posture & wt shift    Curb  4: Min assist prostheses only,    prostheses only,    Curb Details (indicate cue type and reason)  verbal cues on technique, wt shift      High Level Balance   High Level Balance Activities  Side stepping;Backward walking;Direction changes;Head turns;Negotitating around obstacles    High Level Balance Comments  verbal cues on turning 90* to right & left, technique & balance reactions      Self-Care   Lifting  Pt able to lift 1# container from floor with supervision and ambulate 30' carrying item without AD.       Prosthetics   Prosthetic Care Comments   PT has 3 circular spots (2 on LLE & 1 on RLE) that appear to be ringworm.  2 areas with superficial skin breakdown covered with Tegaderm. PT wearing >10ply socks on  BLE.     Current prosthetic wear tolerance (days/week)   daily    Current prosthetic wear tolerance (#hours/day)   reports all awake hours but removes prostheses, leaving liners on limbs for 30 min when uncomfortable.    Education Provided  Skin check;Residual limb care;Proper wear schedule/adjustment    Person(s) Educated  Patient;Spouse    Education Method  Explanation;Verbal cues    Education Method  Verbalized understanding;Needs further instruction             PT Education - 09/27/17 1130    Education provided  Yes    Education Details  modifying pants from  button & zipper to velcro and using elastic belt & installing elastic waistband on pants one size too big    Person(s) Educated  Patient;Spouse    Methods  Explanation;Demonstration;Verbal cues    Comprehension  Verbalized understanding       PT Short Term Goals - 09/13/17 1223      PT SHORT TERM GOAL #1   Title  Patient ambulates 200' with bilateral prostheses no assistive device with supervision.     Baseline  MET 09/13/17    Time  4    Period  Weeks    Status  Achieved      PT SHORT TERM GOAL #2   Title  Patient able to lift <1# object with UE prosthesis with supervision.     Baseline  MET 09/13/17    Time  4    Period  Weeks    Status  Achieved      PT SHORT TERM GOAL #3   Title  Patient negotiates standard height ramps & curbs outdoors with prostheses only with supervision.     Baseline  MET 09/13/17    Time  1    Period  Months    Status  Achieved      PT SHORT TERM GOAL #4   Title  Patient ambulates 100' on non-sloped grass with cane & prostheses with supervision.     Baseline  MET 09/13/17    Time  1    Period  Months    Status  Achieved        PT Long Term Goals - 08/18/17 2212      PT LONG TERM GOAL #1   Title  Patient demonstrates & verbalizes understanding of proper prosthetic care including independent donning to enable safe use of LE prostheses.     Baseline  MET 08/16/17    Time  2     Period  Months    Status  Achieved      PT LONG TERM GOAL #2   Title  Patient will tolerate prostheses wear >90% of awake hours without skin issues or limb pain to demonstrate a decrease in his risk of falling.    Baseline  MET 08/18/2017    Time  2    Period  Months    Status  Achieved      PT LONG TERM GOAL #3   Title  Patient performs standing balance activities with intermittent UE support reaching 10", picking up objects from floor and looks over shoulders with weight shift, trunk rotation with prostheses independently.    Baseline  Progressing 08/18/2017 Pt requires UE support to reach 10" & look over shoulders, minA to pick up object off floor.    Time  2    Period  Months    Status  On-going    Target Date  10/13/17      PT LONG TERM GOAL #4   Title  Patient will ambulate 1000 feet including outdoor surfaces with cane and prostheses modified independently to enable community mobility.      Baseline  Progressing 08/16/17 Pt ambulates 1000' but requires supervison >300' with cane and minA on grass/modA on grass slope;  On 08/18/2017 pt ambulates up to 400' without assistive device TTA prostheses only with intermittent MinA for balance loss.    Time  2    Period  Months    Status  On-going    Target Date  10/13/17      PT LONG TERM GOAL #  5   Title  Patient will negotiate ramp/curbs without assistive device with TTA prostheses only and stairs with 1 rail and prostheses modified independent for community access.     Time  2    Period  Months    Status  Revised    Target Date  10/13/17      PT LONG TERM GOAL #6   Title  Patient ambulates 100' household around furniture and 300' on paved surfaces with prostheses only modified independent.     Time  2    Period  Months    Status  New    Target Date  10/13/17      PT LONG TERM GOAL #7   Title  Patient reports Activities of Balance Confindence score using FOTO >25% to indicate greater confidence in his balance. (Target Date:  08/20/2017)    Time  2    Period  Months    Status  On-going    Target Date  10/13/17      PT LONG TERM GOAL #8   Title  Patient able to lift objects with UE prosthesis from floor modified independent.     Time  2    Period  Months    Status  New    Target Date  10/13/17            Plan - 09/27/17 2203    Clinical Impression Statement  Patient is on target to meet LTGs in 2 weeks. Patient improved ability to change directions & scan environment with prostheses only improved.  Patient appears to need socket revisions due to significant limb changes.     Rehab Potential  Good    Clinical Impairments Affecting Rehab Potential  arthritis, bicuspid aortic valve, BPH, carpal tunnel syndrome in R wrist, CAD, diverticulitis, GERD, gout, hyperlipidemia, HTN, DM type II,thrombocytopenia, thoracic ascending aortic aneurysm, severe aortic stenosis, amputation of bilateral hands except right thumb (12/11/2016) due to gangrene, bilateral transtibial amputations (12/11/2016) due to gangrene, R and L heart cath (10/20/2016)     PT Frequency  2x / week    PT Duration  8 weeks    PT Treatment/Interventions  ADLs/Self Care Home Management;Neuromuscular re-education;Balance training;Therapeutic exercise;Therapeutic activities;Functional mobility training;Stair training;Gait training;DME Instruction;Patient/family education;Prosthetic Training    PT Next Visit Plan  continue towards LTGs    Consulted and Agree with Plan of Care  Patient;Family member/caregiver    Family Member Consulted  wife       Patient will benefit from skilled therapeutic intervention in order to improve the following deficits and impairments:  Abnormal gait, Decreased activity tolerance, Decreased balance, Decreased coordination, Decreased range of motion, Decreased mobility, Decreased knowledge of use of DME, Decreased knowledge of precautions, Decreased endurance, Decreased skin integrity, Decreased scar mobility, Decreased  strength, Difficulty walking, Postural dysfunction, Prosthetic Dependency  Visit Diagnosis: Unsteadiness on feet  Other abnormalities of gait and mobility  Muscle weakness (generalized)  Other symptoms and signs involving the musculoskeletal system  Abnormal posture     Problem List Patient Active Problem List   Diagnosis Date Noted  . Amputation of both hands with complication, subsequent encounter 03/01/2017  . Diabetes ([place]) 02/19/2017  . Status post bilateral below knee amputation ([place]) 02/09/2017  . Wound disruption, post-op, skin, sequela 02/09/2017  . Debility 02/05/2017  . Ganglion upper arm, right   . Thrombosis of both upper extremities   . Suspected heparin induced thrombocytopenia (HIT) in hospitalized patient ([place])   . Gangrene of lower extremity ([place])   .  Heparin induced thrombocytopenia (HIT) ([place])   . Tracheostomy status ([place])   . Chest tube in place   . Atherosclerosis of native arteries of extremities with gangrene, left leg ([place])   . Atherosclerosis of native arteries of extremities with gangrene, right leg ([place])   . Acute on chronic diastolic CHF (congestive heart failure) ([place])   . Enteritis due to Clostridium difficile   . Anasarca   . FUO (fever of unknown origin)   . Acute encephalopathy   . Elevated LFTs   . DIC (disseminated intravascular coagulation) ([place]) 10/28/2016  . Postoperative atrial fibrillation ([place]) 10/24/2016  . Cardiogenic shock ([place])   . Mitral regurgitation due to cusp prolapse 10/22/2016  . S/P aortic valve replacement with bioprosthetic valve 10/22/2016  . S/P ascending aortic aneurysm repair 10/22/2016  . S/P mitral valve repair 10/22/2016  . S/P CABG x 3 10/22/2016  . Thrombocytopenia ([place]) 10/22/2016  . Acute combined systolic and diastolic congestive heart failure ([place]) 10/22/2016  . Acute respiratory failure ([place]) 10/22/2016  . Thoracic ascending aortic aneurysm ([place]) 10/21/2016  . Coronary artery disease involving  native coronary artery of native heart with unstable angina pectoris ([place]) 10/20/2016  . Severe aortic stenosis by prior echocardiography 10/19/2016  . Unstable angina ([place]) 10/19/2016  . Aortic valve stenosis 10/19/2016  . Bicuspid aortic valve 10/19/2016  . Trigger ring finger of right hand   . Wears glasses   . Gout   . Hypertension   . Carpal tunnel syndrome of right wrist   . Arthritis   . Snores     Everest Brod PT, DPT 09/27/2017, 10:07 PM  [facility] [address] [place], [place], [address] Phone: [phone]   Fax:  [phone]  Name: Scott [name] MRN: [id number] Date of Birth: 1946-05-21

## 2017-09-27 NOTE — Progress Notes (Signed)
Office Visit Note   Patient: Scott [name]           Date of Birth: 1946-05-17           MRN: [id number] Visit Date: 09/27/2017              Requested by: Sasser, Silvestre Moment, MD [place], [place] [address] PCP: Manon Hilding, MD  Chief Complaint  Patient presents with  . Right Leg - Follow-up  . Left Leg - Follow-up  . Right Hand - Follow-up  . Left Hand - Follow-up      HPI: Patient is a 71 year old gentleman who is status post bilateral transtibial amputations as well as amputations of both hands.  He has prosthetics for all 4 extremities.  Patient is currently working with [employer] for his sockets his residual volume has decreased significantly and patient is now wearing over 12 ply sock and does not have rotational stability and is at risk of falling due to looseness of the socket.  Patient complains of some pain over the first webspace of the left hand and states he is developed a ringworm infection of the left transtibial amputation.  Assessment & Plan: Visit Diagnoses:  1. Fungal dermatitis   2. S/P BKA (below knee amputation) bilateral ([place])     Plan: A prescription was called in for terbanafin.  Patient will use the black stump shrinker against the skin underneath the silicone liner to help decrease the moisture against the skin.  Patient was given a new prescription for [employer] for bilateral prosthetic sockets with new liners.  Follow-Up Instructions: Return in about 3 weeks (around 10/18/2017).   Ortho Exam  Patient is alert, oriented, no adenopathy, well-dressed, normal affect, normal respiratory effort. Examination patient has stable transtibial amputations.  Patient has a ringworm pattern of a rash on the left transtibial amputation.  He does not have it anywhere else on his body.  Patient's both hands are stable he has some pain in the first webspace of the left hand but there is no redness no cellulitis no signs of infection.  Patient states that he has some  restless leg syndrome which has increased since he is decreased his dose of Neurontin.  Recommended that he try coconut water to decrease the muscle hypersensitivity.  Imaging: No results found. No images are attached to the encounter.  Labs: Lab Results  Component Value Date   HGBA1C 6.6 (H) 10/21/2016   REPTSTATUS 02/06/2017 FINAL 02/01/2017   GRAMSTAIN  02/01/2017    ABUNDANT WBC PRESENT, PREDOMINANTLY PMN MODERATE GRAM NEGATIVE RODS    CULT  02/01/2017    FEW PSEUDOMONAS AERUGINOSA MODERATE BACTEROIDES FRAGILIS BETA LACTAMASE POSITIVE    LABORGA PSEUDOMONAS AERUGINOSA 02/01/2017    Orders:  No orders of the defined types were placed in this encounter.  Meds ordered this encounter  Medications  . terbinafine (LAMISIL) 250 MG tablet    Sig: Take 1 tablet (250 mg total) daily by mouth.    Dispense:  20 tablet    Refill:  0     Procedures: No procedures performed  Clinical Data: No additional findings.  ROS:  All other systems negative, except as noted in the HPI. Review of Systems  Objective: Vital Signs: There were no vitals taken for this visit.  Specialty Comments:  No specialty comments available.  PMFS History: Patient Active Problem List   Diagnosis Date Noted  . Amputation of both hands with complication, subsequent encounter 03/01/2017  .  Diabetes ([place]) 02/19/2017  . Status post bilateral below knee amputation ([place]) 02/09/2017  . Wound disruption, post-op, skin, sequela 02/09/2017  . Debility 02/05/2017  . Ganglion upper arm, right   . Thrombosis of both upper extremities   . Suspected heparin induced thrombocytopenia (HIT) in hospitalized patient ([place])   . Gangrene of lower extremity ([place])   . Heparin induced thrombocytopenia (HIT) ([place])   . Tracheostomy status ([place])   . Chest tube in place   . Atherosclerosis of native arteries of extremities with gangrene, left leg ([place])   . Atherosclerosis of native arteries of extremities with gangrene,  right leg ([place])   . Acute on chronic diastolic CHF (congestive heart failure) ([place])   . Enteritis due to Clostridium difficile   . Anasarca   . FUO (fever of unknown origin)   . Acute encephalopathy   . Elevated LFTs   . DIC (disseminated intravascular coagulation) ([place]) 10/28/2016  . Postoperative atrial fibrillation ([place]) 10/24/2016  . Cardiogenic shock ([place])   . Mitral regurgitation due to cusp prolapse 10/22/2016  . S/P aortic valve replacement with bioprosthetic valve 10/22/2016  . S/P ascending aortic aneurysm repair 10/22/2016  . S/P mitral valve repair 10/22/2016  . S/P CABG x 3 10/22/2016  . Thrombocytopenia ([place]) 10/22/2016  . Acute combined systolic and diastolic congestive heart failure ([place]) 10/22/2016  . Acute respiratory failure ([place]) 10/22/2016  . Thoracic ascending aortic aneurysm ([place]) 10/21/2016  . Coronary artery disease involving native coronary artery of native heart with unstable angina pectoris ([place]) 10/20/2016  . Severe aortic stenosis by prior echocardiography 10/19/2016  . Unstable angina ([place]) 10/19/2016  . Aortic valve stenosis 10/19/2016  . Bicuspid aortic valve 10/19/2016  . Trigger ring finger of right hand   . Wears glasses   . Gout   . Hypertension   . Carpal tunnel syndrome of right wrist   . Arthritis   . Snores    Past Medical History:  Diagnosis Date  . Arthritis    "hips, shoulders; knees; back" (10/20/2016)  . Bicuspid aortic valve   . BPH (benign prostatic hypertrophy)   . Carpal tunnel syndrome of right wrist   . Coronary artery disease involving native coronary artery of native heart with unstable angina pectoris ([place]) 10/20/2016  . Diverticulitis   . GERD (gastroesophageal reflux disease)   . Gout   . Heart murmur   . Hyperlipemia   . Hypertension   . Postoperative atrial fibrillation ([place]) 10/24/2016  . RLS (restless legs syndrome)   . S/P aortic valve replacement with bioprosthetic valve 10/22/2016   25 mm [facility]  Ease bovine pericardial bioprosthetic tissue valve  . S/P ascending aortic aneurysm repair 10/22/2016   28 mm supracoronary straight graft replacement of ascending thoracic aortic aneurysm  . S/P CABG x 3 10/22/2016   Sequential LIMA to Diag and LAD, SVG to distal LAD, open vein harvest right thigh  . S/P mitral valve repair 10/22/2016   Artificial Gore-tex neochord placement x6 - posterior annuloplasty band placed but removed due to systolic anterior motion of mitral valve  . Severe aortic stenosis   . Snores    Never been tested for sleep apnea  . Thoracic ascending aortic aneurysm ([place]) 10/21/2016  . Thrombocytopenia ([place]) 10/22/2016  . Type II diabetes mellitus ([facility])     Family History  Problem Relation Age of Onset  . Lung cancer Mother   . Clotting disorder Father        No details  .  Heart disease Sister 29       Stents  . Cancer Sister        Throat    Past Surgical History:  Procedure Laterality Date  . CATARACT EXTRACTION W/ INTRAOCULAR LENS  IMPLANT, BILATERAL Bilateral 1978  . COLONOSCOPY    . FRACTURE SURGERY    . INGUINAL HERNIA REPAIR Right 1998  . IR GENERIC HISTORICAL  12/07/2016   IR US GUIDE VASC ACCESS RIGHT 12/07/2016 [facility]let[facility] Edouard, MD [facility]  . IR GENERIC HISTORICAL  12/07/2016   IR RADIOLOGY PERIPHERAL GUIDED IV START 12/07/2016 Aletta Edouard, MD [facility]  . IR GENERIC HISTORICAL  12/07/2016   IR GASTROSTOMY TUBE MOD SED 12/07/2016 Aletta Edouard, MD [facility]  . LIPOMA EXCISION Right 2008   "side of my head"  . PENECTOMY  2007   Peyronie's disease   . PENILE PROSTHESIS IMPLANT  2009  . SHOULDER OPEN ROTATOR CUFF REPAIR Right 2006  . TONSILLECTOMY  ~ 1955  . TRANSURETHRAL RESECTION OF PROSTATE  2005  . TRIGGER FINGER RELEASE Bilateral    several lt and rt hands  . WRIST FRACTURE SURGERY Right ~ 1959   Social History   Occupational History  . Occupation: Designer, multimedia: [employer]    Comment: retired  Tobacco Use    . Smoking status: Former Smoker    Years: 3.00    Types: Pipe, Cigars    Last attempt to quit: 1984    Years since quitting: 34.8  . Smokeless tobacco: Never Used  Substance and Sexual Activity  . Alcohol use: Yes    Alcohol/week: 6.0 oz    Types: 10 Cans of beer per week  . Drug use: No  . Sexual activity: Not Currently

## 2017-10-01 DIAGNOSIS — M1A9XX0 Chronic gout, unspecified, without tophus (tophi): Secondary | ICD-10-CM | POA: Diagnosis not present

## 2017-10-01 DIAGNOSIS — E7800 Pure hypercholesterolemia, unspecified: Secondary | ICD-10-CM | POA: Diagnosis not present

## 2017-10-01 DIAGNOSIS — I10 Essential (primary) hypertension: Secondary | ICD-10-CM | POA: Diagnosis not present

## 2017-10-01 DIAGNOSIS — E1159 Type 2 diabetes mellitus with other circulatory complications: Secondary | ICD-10-CM | POA: Diagnosis not present

## 2017-10-01 DIAGNOSIS — E1151 Type 2 diabetes mellitus with diabetic peripheral angiopathy without gangrene: Secondary | ICD-10-CM | POA: Diagnosis not present

## 2017-10-01 DIAGNOSIS — K210 Gastro-esophageal reflux disease with esophagitis: Secondary | ICD-10-CM | POA: Diagnosis not present

## 2017-10-01 DIAGNOSIS — E781 Pure hyperglyceridemia: Secondary | ICD-10-CM | POA: Diagnosis not present

## 2017-10-01 DIAGNOSIS — E782 Mixed hyperlipidemia: Secondary | ICD-10-CM | POA: Diagnosis not present

## 2017-10-01 DIAGNOSIS — E1140 Type 2 diabetes mellitus with diabetic neuropathy, unspecified: Secondary | ICD-10-CM | POA: Diagnosis not present

## 2017-10-04 ENCOUNTER — Encounter: Payer: Self-pay | Admitting: Physical Therapy

## 2017-10-04 ENCOUNTER — Ambulatory Visit: Payer: Medicare Other | Admitting: Physical Therapy

## 2017-10-04 DIAGNOSIS — M6281 Muscle weakness (generalized): Secondary | ICD-10-CM | POA: Diagnosis not present

## 2017-10-04 DIAGNOSIS — R293 Abnormal posture: Secondary | ICD-10-CM | POA: Diagnosis not present

## 2017-10-04 DIAGNOSIS — R29898 Other symptoms and signs involving the musculoskeletal system: Secondary | ICD-10-CM

## 2017-10-04 DIAGNOSIS — R2681 Unsteadiness on feet: Secondary | ICD-10-CM

## 2017-10-04 DIAGNOSIS — R2689 Other abnormalities of gait and mobility: Secondary | ICD-10-CM | POA: Diagnosis not present

## 2017-10-05 ENCOUNTER — Ambulatory Visit (INDEPENDENT_AMBULATORY_CARE_PROVIDER_SITE_OTHER): Payer: Medicare Other | Admitting: *Deleted

## 2017-10-05 DIAGNOSIS — I82603 Acute embolism and thrombosis of unspecified veins of upper extremity, bilateral: Secondary | ICD-10-CM

## 2017-10-05 DIAGNOSIS — Q231 Congenital insufficiency of aortic valve: Secondary | ICD-10-CM | POA: Diagnosis not present

## 2017-10-05 DIAGNOSIS — I9789 Other postprocedural complications and disorders of the circulatory system, not elsewhere classified: Secondary | ICD-10-CM

## 2017-10-05 DIAGNOSIS — Z9889 Other specified postprocedural states: Secondary | ICD-10-CM | POA: Diagnosis not present

## 2017-10-05 DIAGNOSIS — I4891 Unspecified atrial fibrillation: Secondary | ICD-10-CM

## 2017-10-05 LAB — POCT INR: INR: 2.4

## 2017-10-05 NOTE — Therapy (Signed)
[facility] [address] [place], [place], [address] Phone: [phone]   Fax:  [phone]  Physical Therapy Treatment  Patient Details  Name: Scott [name] MRN: [id number] Date of Birth: 06/05/1946 Referring Provider: Meridee Score MD    Encounter Date: 10/04/2017  PT End of Session - 10/04/17 1624    Visit Number  40    Number of Visits  41    Date for PT Re-Evaluation  10/13/17    Authorization Type  Medicare & G-codes every 10th visit     PT Start Time  1100    PT Stop Time  1146    PT Time Calculation (min)  46 min    Equipment Utilized During Treatment  Gait belt    Activity Tolerance  Patient tolerated treatment well    Behavior During Therapy  [facility] for tasks assessed/performed       Past Medical History:  Diagnosis Date  . Arthritis    "hips, shoulders; knees; back" (10/20/2016)  . Bicuspid aortic valve   . BPH (benign prostatic hypertrophy)   . Carpal tunnel syndrome of right wrist   . Coronary artery disease involving native coronary artery of native heart with unstable angina pectoris ([place]) 10/20/2016  . Diverticulitis   . GERD (gastroesophageal reflux disease)   . Gout   . Heart murmur   . Hyperlipemia   . Hypertension   . Postoperative atrial fibrillation ([place]) 10/24/2016  . RLS (restless legs syndrome)   . S/P aortic valve replacement with bioprosthetic valve 10/22/2016   25 mm [facility] Ease bovine pericardial bioprosthetic tissue valve  . S/P ascending aortic aneurysm repair 10/22/2016   28 mm supracoronary straight graft replacement of ascending thoracic aortic aneurysm  . S/P CABG x 3 10/22/2016   Sequential LIMA to Diag and LAD, SVG to distal LAD, open vein harvest right thigh  . S/P mitral valve repair 10/22/2016   Artificial Gore-tex neochord placement x6 - posterior annuloplasty band placed but removed due to systolic anterior motion of mitral valve  . Severe aortic stenosis    . Snores    Never been tested for sleep apnea  . Thoracic ascending aortic aneurysm ([place]) 10/21/2016  . Thrombocytopenia ([place]) 10/22/2016  . Type II diabetes mellitus ([place])     Past Surgical History:  Procedure Laterality Date  . CATARACT EXTRACTION W/ INTRAOCULAR LENS  IMPLANT, BILATERAL Bilateral 1978  . COLONOSCOPY    . FRACTURE SURGERY    . INGUINAL HERNIA REPAIR Right 1998  . IR GENERIC HISTORICAL  12/07/2016   IR US GUIDE VASC ACCESS RIGHT 12/07/2016 Aletta Edouard, MD [facility]  . IR GENERIC HISTORICAL  12/07/2016   IR RADIOLOGY PERIPHERAL GUIDED IV START 12/07/2016 Aletta Edouard, MD [facility]  . IR GENERIC HISTORICAL  12/07/2016   IR GASTROSTOMY TUBE MOD SED 12/07/2016 Aletta Edouard, MD [facility]  . LIPOMA EXCISION Right 2008   "side of my head"  . PENECTOMY  2007   Peyronie's disease   . PENILE PROSTHESIS IMPLANT  2009  . SHOULDER OPEN ROTATOR CUFF REPAIR Right 2006  . TONSILLECTOMY  ~ 1955  . TRANSURETHRAL RESECTION OF PROSTATE  2005  . TRIGGER FINGER RELEASE Bilateral    several lt and rt hands  . WRIST FRACTURE SURGERY Right ~ 1959    There were no vitals filed for this visit.  Subjective Assessment - 10/04/17 1102    Subjective  He drove car in parking lot using right prosthesis  gas to brake. No falls.     Patient is accompained by:  Family member    Pertinent History  arthritis, bicuspid aortic valve, BPH, carpal tunnel syndrome in R wrist, CAD, diverticulitis, GERD, gout, hyperlipidemia, HTN, DM type II,thrombocytopenia, thoracic ascending aortic aneurysm, severe aortic stenosis, amputation of bilateral hands except right thumb (12/11/2016) due to gangrene, bilateral transtibial amputations (12/11/2016) due to gangrene, R and L heart cath (10/20/2016)     Limitations  Standing;Walking;House hold activities    Patient Stated Goals  use prostheses to walk including stairs, outdoors, target shooting, drive    Currently in Pain?  No/denies      Prosthetic  Training with Bilateral TTA  Prostheses: Patient had MD visit with Dr. Sharol Given last Monday after PT. Dr. Sharol Given advised patient to use shrinker sock under liner to wick sweat from skin. However this impaired suspension without gel in contact with skin and caused slippage of prostheses enabling air in distal area. He developed a blister on right distal limb which pt appropriately used Tegaderm to enable to heal. His left limb has healing ring worm.  PT instructed pt and wife in possible alterations of pants due to amputation of 9 digits. Pt & wife verbalized understanding. Pt ambulated 500' working on scanning with head turns and negotiating around obstacles with verbal cues.  Pt negotiated stairs alternating pattern with 2 rails with verbal cues.  Pt negotiated ramp 3 reps without device with minor verbal cues.  Pt negotiated curb without device: 7" with minA and 3" with supervision with verbal cues.  Stepping over obstacles with supervision/verbal cues. Picking up light weight object from floor 2 reps with supervision.  Pt ambulated 100' carrying light weight items with supervision.                           PT Short Term Goals - 09/13/17 1223      PT SHORT TERM GOAL #1   Title  Patient ambulates 200' with bilateral prostheses no assistive device with supervision.     Baseline  MET 09/13/17    Time  4    Period  Weeks    Status  Achieved      PT SHORT TERM GOAL #2   Title  Patient able to lift <1# object with UE prosthesis with supervision.     Baseline  MET 09/13/17    Time  4    Period  Weeks    Status  Achieved      PT SHORT TERM GOAL #3   Title  Patient negotiates standard height ramps & curbs outdoors with prostheses only with supervision.     Baseline  MET 09/13/17    Time  1    Period  Months    Status  Achieved      PT SHORT TERM GOAL #4   Title  Patient ambulates 100' on non-sloped grass with cane & prostheses with supervision.     Baseline  MET  09/13/17    Time  1    Period  Months    Status  Achieved        PT Long Term Goals - 08/18/17 2212      PT LONG TERM GOAL #1   Title  Patient demonstrates & verbalizes understanding of proper prosthetic care including independent donning to enable safe use of LE prostheses.     Baseline  MET 08/16/17    Time  2  Period  Months    Status  Achieved      PT LONG TERM GOAL #2   Title  Patient will tolerate prostheses wear >90% of awake hours without skin issues or limb pain to demonstrate a decrease in his risk of falling.    Baseline  MET 08/18/2017    Time  2    Period  Months    Status  Achieved      PT LONG TERM GOAL #3   Title  Patient performs standing balance activities with intermittent UE support reaching 10", picking up objects from floor and looks over shoulders with weight shift, trunk rotation with prostheses independently.    Baseline  Progressing 08/18/2017 Pt requires UE support to reach 10" & look over shoulders, minA to pick up object off floor.    Time  2    Period  Months    Status  On-going    Target Date  10/13/17      PT LONG TERM GOAL #4   Title  Patient will ambulate 1000 feet including outdoor surfaces with cane and prostheses modified independently to enable community mobility.      Baseline  Progressing 08/16/17 Pt ambulates 1000' but requires supervison >300' with cane and minA on grass/modA on grass slope;  On 08/18/2017 pt ambulates up to 400' without assistive device TTA prostheses only with intermittent MinA for balance loss.    Time  2    Period  Months    Status  On-going    Target Date  10/13/17      PT LONG TERM GOAL #5   Title  Patient will negotiate ramp/curbs without assistive device with TTA prostheses only and stairs with 1 rail and prostheses modified independent for community access.     Time  2    Period  Months    Status  Revised    Target Date  10/13/17      PT LONG TERM GOAL #6   Title  Patient ambulates 100' household around  furniture and 300' on paved surfaces with prostheses only modified independent.     Time  2    Period  Months    Status  New    Target Date  10/13/17      PT LONG TERM GOAL #7   Title  Patient reports Activities of Balance Confindence score using FOTO >25% to indicate greater confidence in his balance. (Target Date: 08/20/2017)    Time  2    Period  Months    Status  On-going    Target Date  10/13/17      PT LONG TERM GOAL #8   Title  Patient able to lift objects with UE prosthesis from floor modified independent.     Time  2    Period  Months    Status  New    Target Date  10/13/17            Plan - 10/04/17 1624    Clinical Impression Statement  Patient appears ready for discharge next week. He is functioning in home & community with bilateral Transtibial Amputation prostheses. He still needs cane for curb negotiation for safety.     Rehab Potential  Good    Clinical Impairments Affecting Rehab Potential  arthritis, bicuspid aortic valve, BPH, carpal tunnel syndrome in R wrist, CAD, diverticulitis, GERD, gout, hyperlipidemia, HTN, DM type II,thrombocytopenia, thoracic ascending aortic aneurysm, severe aortic stenosis, amputation of bilateral hands except right thumb (12/11/2016) due  to gangrene, bilateral transtibial amputations (12/11/2016) due to gangrene, R and L heart cath (10/20/2016)     PT Frequency  2x / week    PT Duration  8 weeks    PT Treatment/Interventions  ADLs/Self Care Home Management;Neuromuscular re-education;Balance training;Therapeutic exercise;Therapeutic activities;Functional mobility training;Stair training;Gait training;DME Instruction;Patient/family education;Prosthetic Training    PT Next Visit Plan  check LTGs & discharge    Consulted and Agree with Plan of Care  Patient;Family member/caregiver    Family Member Consulted  wife       Patient will benefit from skilled therapeutic intervention in order to improve the following deficits and impairments:   Abnormal gait, Decreased activity tolerance, Decreased balance, Decreased coordination, Decreased range of motion, Decreased mobility, Decreased knowledge of use of DME, Decreased knowledge of precautions, Decreased endurance, Decreased skin integrity, Decreased scar mobility, Decreased strength, Difficulty walking, Postural dysfunction, Prosthetic Dependency  Visit Diagnosis: Unsteadiness on feet  Other abnormalities of gait and mobility  Muscle weakness (generalized)  Other symptoms and signs involving the musculoskeletal system  Abnormal posture   G-Codes - 10/18/17 1627    Functional Assessment Tool Used (Outpatient Only)  Patient tolerates wearing prostheses all awake hours. He attempted to use shrinker under liner as advised by MD and got a blister on distal right limb 1 week ago. Blister is healing and pt stopped use of shrinker under liner as caused slippage & air in distal area. Pt verbalized understanding of concerns with sock under liner.     Functional Limitation  Self care    Mobility: Walking and Moving Around Current Status [phone])  At least 20 percent but less than 40 percent impaired, limited or restricted    Mobility: Walking and Moving Around Goal Status [phone])  At least 1 percent but less than 20 percent impaired, limited or restricted       Problem List Patient Active Problem List   Diagnosis Date Noted  . Amputation of both hands with complication, subsequent encounter 03/01/2017  . Diabetes ([place]) 02/19/2017  . Status post bilateral below knee amputation ([place]) 02/09/2017  . Wound disruption, post-op, skin, sequela 02/09/2017  . Debility 02/05/2017  . Ganglion upper arm, right   . Thrombosis of both upper extremities   . Suspected heparin induced thrombocytopenia (HIT) in hospitalized patient ([place])   . Gangrene of lower extremity ([place])   . Heparin induced thrombocytopenia (HIT) ([place])   . Tracheostomy status ([place])   . Chest tube in place   . Atherosclerosis  of native arteries of extremities with gangrene, left leg ([place])   . Atherosclerosis of native arteries of extremities with gangrene, right leg ([place])   . Acute on chronic diastolic CHF (congestive heart failure) ([place])   . Enteritis due to Clostridium difficile   . Anasarca   . FUO (fever of unknown origin)   . Acute encephalopathy   . Elevated LFTs   . DIC (disseminated intravascular coagulation) ([place]) 10/28/2016  . Postoperative atrial fibrillation ([place]) 10/24/2016  . Cardiogenic shock ([place])   . Mitral regurgitation due to cusp prolapse 10/22/2016  . S/P aortic valve replacement with bioprosthetic valve 10/22/2016  . S/P ascending aortic aneurysm repair 10/22/2016  . S/P mitral valve repair 10/22/2016  . S/P CABG x 3 10/22/2016  . Thrombocytopenia ([place]) 10/22/2016  . Acute combined systolic and diastolic congestive heart failure ([place]) 10/22/2016  . Acute respiratory failure ([place]) 10/22/2016  . Thoracic ascending aortic aneurysm ([place]) 10/21/2016  . Coronary artery disease involving native coronary artery of native  heart with unstable angina pectoris ([place]) 10/20/2016  . Severe aortic stenosis by prior echocardiography 10/19/2016  . Unstable angina ([place]) 10/19/2016  . Aortic valve stenosis 10/19/2016  . Bicuspid aortic valve 10/19/2016  . Trigger ring finger of right hand   . Wears glasses   . Gout   . Hypertension   . Carpal tunnel syndrome of right wrist   . Arthritis   . Snores    Physical Therapy Progress Note  Dates of Reporting Period: 08/11/2017 to 10/04/2017  Objective Reports of Subjective Statement: Patient and wife report improved function with bilateral prostheses.   Objective Measurements: see above  Goal Update: see above  Plan: discharge planned at next visit.   Reason Skilled Services are Required: Reassessment of function for discharge.     Jamey Reas PT, DPT 10/05/2017, 6:30 AM  [facility] [address] [place], [place], [address] Phone: [phone]   Fax:  [phone]  Name: Scott [name] MRN: [id number] Date of Birth: 04-Aug-1946

## 2017-10-06 DIAGNOSIS — M1A9XX0 Chronic gout, unspecified, without tophus (tophi): Secondary | ICD-10-CM | POA: Diagnosis not present

## 2017-10-06 DIAGNOSIS — G2581 Restless legs syndrome: Secondary | ICD-10-CM | POA: Diagnosis not present

## 2017-10-06 DIAGNOSIS — N401 Enlarged prostate with lower urinary tract symptoms: Secondary | ICD-10-CM | POA: Diagnosis not present

## 2017-10-06 DIAGNOSIS — I10 Essential (primary) hypertension: Secondary | ICD-10-CM | POA: Diagnosis not present

## 2017-10-06 DIAGNOSIS — E1140 Type 2 diabetes mellitus with diabetic neuropathy, unspecified: Secondary | ICD-10-CM | POA: Diagnosis not present

## 2017-10-06 DIAGNOSIS — Z6837 Body mass index (BMI) 37.0-37.9, adult: Secondary | ICD-10-CM | POA: Diagnosis not present

## 2017-10-06 DIAGNOSIS — E782 Mixed hyperlipidemia: Secondary | ICD-10-CM | POA: Diagnosis not present

## 2017-10-11 ENCOUNTER — Other Ambulatory Visit (INDEPENDENT_AMBULATORY_CARE_PROVIDER_SITE_OTHER): Payer: Self-pay | Admitting: Family

## 2017-10-11 ENCOUNTER — Ambulatory Visit: Payer: Medicare Other | Admitting: Physical Therapy

## 2017-10-11 ENCOUNTER — Encounter: Payer: Self-pay | Admitting: Physical Therapy

## 2017-10-11 DIAGNOSIS — R2689 Other abnormalities of gait and mobility: Secondary | ICD-10-CM | POA: Diagnosis not present

## 2017-10-11 DIAGNOSIS — R29898 Other symptoms and signs involving the musculoskeletal system: Secondary | ICD-10-CM

## 2017-10-11 DIAGNOSIS — M6281 Muscle weakness (generalized): Secondary | ICD-10-CM

## 2017-10-11 DIAGNOSIS — R2681 Unsteadiness on feet: Secondary | ICD-10-CM

## 2017-10-11 DIAGNOSIS — R293 Abnormal posture: Secondary | ICD-10-CM | POA: Diagnosis not present

## 2017-10-11 NOTE — Therapy (Signed)
[facility] [address] [place], [place], [address] Phone: [phone]   Fax:  [phone]  Physical Therapy Treatment  Patient Details  Name: Scott [name] MRN: [id number] Date of Birth: 09-12-1946 Referring Provider: Meridee Score MD    Encounter Date: 10/11/2017  PT End of Session - 10/11/17 1300    Visit Number  41    Number of Visits  41    Date for PT Re-Evaluation  10/13/17    Authorization Type  Medicare & G-codes every 10th visit     PT Start Time  1100    PT Stop Time  1155    PT Time Calculation (min)  55 min    Equipment Utilized During Treatment  Gait belt    Activity Tolerance  Patient tolerated treatment well    Behavior During Therapy  [facility] for tasks assessed/performed       Past Medical History:  Diagnosis Date  . Arthritis    "hips, shoulders; knees; back" (10/20/2016)  . Bicuspid aortic valve   . BPH (benign prostatic hypertrophy)   . Carpal tunnel syndrome of right wrist   . Coronary artery disease involving native coronary artery of native heart with unstable angina pectoris ([place]) 10/20/2016  . Diverticulitis   . GERD (gastroesophageal reflux disease)   . Gout   . Heart murmur   . Hyperlipemia   . Hypertension   . Postoperative atrial fibrillation ([place]) 10/24/2016  . RLS (restless legs syndrome)   . S/P aortic valve replacement with bioprosthetic valve 10/22/2016   25 mm [facility] Ease bovine pericardial bioprosthetic tissue valve  . S/P ascending aortic aneurysm repair 10/22/2016   28 mm supracoronary straight graft replacement of ascending thoracic aortic aneurysm  . S/P CABG x 3 10/22/2016   Sequential LIMA to Diag and LAD, SVG to distal LAD, open vein harvest right thigh  . S/P mitral valve repair 10/22/2016   Artificial Gore-tex neochord placement x6 - posterior annuloplasty band placed but removed due to systolic anterior motion of mitral valve  . Severe aortic stenosis    . Snores    Never been tested for sleep apnea  . Thoracic ascending aortic aneurysm ([place]) 10/21/2016  . Thrombocytopenia ([place]) 10/22/2016  . Type II diabetes mellitus ([place])     Past Surgical History:  Procedure Laterality Date  . AMPUTATION BELOW KNEE BILATERALLY Bilateral 12/11/2016   Performed by [name], MD at [place]  . AMPUTATION BILATERAL HANDS EXCEPT RIGHT THUMB Bilateral 12/11/2016   Performed by [name], MD at [place]  . AORTIC VALVE REPLACEMENT (AVR) WITH SIZE 25 MM MAGNA EASE PERICARDIAL BIOPROSTHESIS - AORTIC N/A 10/22/2016   Performed by Rexene Alberts, MD at [place] (AAA) WITH 28 MM HEMASHIELD PLATINUM WOVEN DOUBLE VELOUR VASCULAR GRAFT  10/22/2016   Performed by Rexene Alberts, MD at [place]  . CATARACT EXTRACTION W/ INTRAOCULAR LENS  IMPLANT, BILATERAL Bilateral 1978  . COLONOSCOPY    . CORONARY ARTERY BYPASS GRAFTING (CABG)x 2 WITH LIMA TO DIAGONAL, OPEN  HARVESTING OF RIGHT SAPHENOUS VEIN FOR VEIN GRAFT TO LAD N/A 10/22/2016   Performed by Rexene Alberts, MD at [place]  . EXCISION RIGHT INDEX METACARPAL HEAD Right 02/01/2017   Performed by [name], MD at [place]  . FRACTURE SURGERY    . INGUINAL HERNIA REPAIR Right 1998  . IR GENERIC HISTORICAL  12/07/2016   IR  US GUIDE VASC ACCESS RIGHT 12/07/2016 Aletta Edouard, MD [facility]  . IR GENERIC HISTORICAL  12/07/2016   IR RADIOLOGY PERIPHERAL GUIDED IV START 12/07/2016 Aletta Edouard, MD [facility]  . IR GENERIC HISTORICAL  12/07/2016   IR GASTROSTOMY TUBE MOD SED 12/07/2016 Aletta Edouard, MD [facility]  . LIPOMA EXCISION Right 2008   "side of my head"  . MITRAL VALVE REPAIR (MVR) WITH SIZE 30 SORIN ANNULOFLEX ANNULOPLASTY RING WITH SUBSEQUENT REMOVAL OF RING N/A 10/22/2016   Performed by Rexene Alberts, MD at [facility] OR  . PENECTOMY  2007   Peyronie's disease   . PENILE PROSTHESIS IMPLANT  2009  . REMOVAL OF PENILE PROSTHESIS N/A 02/01/2017   Performed by  Kathie Rhodes, MD at [place] (TENDON SHEATH INCISION) Right 11/28/2013   Performed by Lorn Junes, MD at [facility]  . RIGHT WRIST CARPAL TUNNEL RELEASE Right 11/28/2013   Performed by Lorn Junes, MD at [facility]  . Right/Left Heart Cath and Coronary Angiography N/A 10/20/2016   Performed by Leonie Man, MD at [place] [facility]  . SHOULDER OPEN ROTATOR CUFF REPAIR Right 2006  . STERNAL WASHOUT AND DELAYED PRIMARY CLOSURE N/A 10/26/2016   Performed by Rexene Alberts, MD at [facility] OR  . TONSILLECTOMY  ~ 1955  . TRACHEOSTOMY N/A 11/09/2016   Performed by Rexene Alberts, MD at [facility] OR  . TRANSESOPHAGEAL ECHOCARDIOGRAM (TEE) N/A 10/26/2016   Performed by Rexene Alberts, MD at [place]  . TRANSESOPHAGEAL ECHOCARDIOGRAM (TEE) N/A 10/22/2016   Performed by Rexene Alberts, MD at [place]  . TRANSURETHRAL RESECTION OF PROSTATE  2005  . TRIGGER FINGER RELEASE Bilateral    several lt and rt hands  . VIDEO BRONCHOSCOPY N/A 11/09/2016   Performed by Rexene Alberts, MD at [place] Right ~ 1959    There were no vitals filed for this visit.  Subjective Assessment - 10/11/17 1101    Subjective  He drove around the neighborhood without issues. He is working with prosthetist on socket revisions. He reports significant progress with his mobility with PT.     Patient is accompained by:  Family member    Pertinent History  arthritis, bicuspid aortic valve, BPH, carpal tunnel syndrome in R wrist, CAD, diverticulitis, GERD, gout, hyperlipidemia, HTN, DM type II,thrombocytopenia, thoracic ascending aortic aneurysm, severe aortic stenosis, amputation of bilateral hands except right thumb (12/11/2016) due to gangrene, bilateral transtibial amputations (12/11/2016) due to gangrene, R and L heart cath (10/20/2016)     Limitations  Standing;Walking;House hold activities    Patient Stated Goals  use prostheses to walk  including stairs, outdoors, target shooting, drive    Currently in Pain?  No/denies         [facility] PT Assessment - 10/11/17 1100      Observation/Other Assessments   Focus on Therapeutic Outcomes (FOTO)   45.77 Functional Status initial FS 20.55    Activities of Balance Confidence Scale (ABC Scale)   70.0% initial 5.0%      Transfers   Transfers  Sit to Stand;Stand to Sit    Sit to Stand  6: Modified independent (Device/Increase time);With upper extremity assist;From chair/3-in-1 without armrests    Stand to Sit  6: Modified independent (Device/Increase time);With upper extremity assist;To chair/3-in-1 without armrests    Floor to Transfer  6: Modified independent (Device/Increase time);With upper  extremity assist from ground using cane support      Ambulation/Gait   Ambulation/Gait  Yes    Ambulation/Gait Assistance  6: Modified independent (Device/Increase time)    Ambulation/Gait Assistance Details  up to 500' without device except prostheses;  >1000' with cane intermittently needs grass hill.     Ambulation Distance (Feet)  1200 Feet    Assistive device  Prostheses;None;Straight cane    Gait Pattern  Step-through pattern;Wide base of support    Ambulation Surface  Indoor;Level;Outdoor;Unlevel;Paved;Gravel;Grass    Gait velocity  2.69 ft/sec comfortable pace & 3.79 ft/sec    Stairs  Yes    Stairs Assistance  6: Modified independent (Device/Increase time)    Stair Management Technique  Two rails;Alternating pattern;One rail Left;Step to pattern;Forwards    Number of Stairs  4 3 reps    Ramp  6: Modified independent (Device) prostheses only    Curb  6: Modified independent (Device/increase time) cane & prostheses      Static Standing Balance   Static Standing - Balance Support  No upper extremity supported    Static Standing - Level of Assistance  6: Modified independent (Device/Increase time)    Static Standing - Comment/# of Minutes  stands > 2 minutes safely      Dynamic  Standing Balance   Dynamic Standing - Balance Support  No upper extremity supported    Dynamic Standing - Level of Assistance  6: Modified independent (Device/Increase time)    Dynamic Standing - Balance Activities  Head nods;Head turns;Reaching for objects    Dynamic Standing - Comments  reaches 10" anteriorly, picks up object from floor and weight shifts to look over his shoulders.                             PT Short Term Goals - 09/13/17 1223      PT SHORT TERM GOAL #1   Title  Patient ambulates 200' with bilateral prostheses no assistive device with supervision.     Baseline  MET 09/13/17    Time  4    Period  Weeks    Status  Achieved      PT SHORT TERM GOAL #2   Title  Patient able to lift <1# object with UE prosthesis with supervision.     Baseline  MET 09/13/17    Time  4    Period  Weeks    Status  Achieved      PT SHORT TERM GOAL #3   Title  Patient negotiates standard height ramps & curbs outdoors with prostheses only with supervision.     Baseline  MET 09/13/17    Time  1    Period  Months    Status  Achieved      PT SHORT TERM GOAL #4   Title  Patient ambulates 100' on non-sloped grass with cane & prostheses with supervision.     Baseline  MET 09/13/17    Time  1    Period  Months    Status  Achieved        PT Long Term Goals - 10/11/17 1300      PT LONG TERM GOAL #1   Title  Patient demonstrates & verbalizes understanding of proper prosthetic care including independent donning to enable safe use of LE prostheses.     Baseline  MET 08/16/17    Time  2    Period  Months  Status  Achieved      PT LONG TERM GOAL #2   Title  Patient will tolerate prostheses wear >90% of awake hours without skin issues or limb pain to demonstrate a decrease in his risk of falling.    Baseline  MET 08/18/2017    Time  2    Period  Months    Status  Achieved      PT LONG TERM GOAL #3   Title  Patient performs standing balance activities with  intermittent UE support reaching 10", picking up objects from floor and looks over shoulders with weight shift, trunk rotation with prostheses independently.    Baseline  MET 10/11/2017    Time  2    Period  Months    Status  Achieved      PT LONG TERM GOAL #4   Title  Patient will ambulate 1000 feet including outdoor surfaces with cane and prostheses modified independently to enable community mobility.      Baseline  MET 10/11/2017     Time  2    Period  Months    Status  Achieved      PT LONG TERM GOAL #5   Title  Patient will negotiate ramp/curbs without assistive device with TTA prostheses only and stairs with 1 rail and prostheses modified independent for community access.     Baseline  MET 10/11/2017    Time  2    Period  Months    Status  Achieved      PT LONG TERM GOAL #6   Title  Patient ambulates 100' household around furniture and 300' on paved surfaces with prostheses only modified independent.     Baseline  MET 10/11/2017    Time  2    Period  Months    Status  Achieved      PT LONG TERM GOAL #7   Title  Patient reports Activities of Balance Confindence score using FOTO >25% to indicate greater confidence in his balance. (Target Date: 08/20/2017)    Baseline  MET 10/11/2017  ABC score improved to 70.0% from 5.0%    Time  2    Period  Months    Status  Achieved      PT LONG TERM GOAL #8   Title  Patient able to lift objects with UE prosthesis from floor modified independent.     Baseline  MET 10/11/2017    Time  2    Period  Months    Status  Achieved            Plan - 10/11/17 1303    Clinical Impression Statement  patient met all LTGs. He appears to be functioning with prostheses only at community level and uses cane for distances >500-700' or on grass slopes. Patient agrees to discharge at this time but may need additional training with new prostheses.     Rehab Potential  Good    Clinical Impairments Affecting Rehab Potential  arthritis, bicuspid  aortic valve, BPH, carpal tunnel syndrome in R wrist, CAD, diverticulitis, GERD, gout, hyperlipidemia, HTN, DM type II,thrombocytopenia, thoracic ascending aortic aneurysm, severe aortic stenosis, amputation of bilateral hands except right thumb (12/11/2016) due to gangrene, bilateral transtibial amputations (12/11/2016) due to gangrene, R and L heart cath (10/20/2016)     PT Frequency  2x / week    PT Duration  8 weeks    PT Treatment/Interventions  ADLs/Self Care Home Management;Neuromuscular re-education;Balance training;Therapeutic exercise;Therapeutic activities;Functional mobility training;Stair training;Gait training;DME Instruction;Patient/family  education;Prosthetic Training    PT Next Visit Plan  discharge    Consulted and Agree with Plan of Care  Patient;Family member/caregiver    Family Member Consulted  wife       Patient will benefit from skilled therapeutic intervention in order to improve the following deficits and impairments:  Abnormal gait, Decreased activity tolerance, Decreased balance, Decreased coordination, Decreased range of motion, Decreased mobility, Decreased knowledge of use of DME, Decreased knowledge of precautions, Decreased endurance, Decreased skin integrity, Decreased scar mobility, Decreased strength, Difficulty walking, Postural dysfunction, Prosthetic Dependency  Visit Diagnosis: Unsteadiness on feet  Other abnormalities of gait and mobility  Muscle weakness (generalized)  Other symptoms and signs involving the musculoskeletal system   G-Codes - 2017/10/31 1305    Functional Assessment Tool Used (Outpatient Only)  Patient tolerates wearing prostheses all awake hours without skin issues or limb pain. Patient verbalizes proper prosthetic care     Functional Limitation  Self care    Self Care Goal Status [phone])  At least 1 percent but less than 20 percent impaired, limited or restricted    Self Care Discharge Status [phone])  At least 1 percent but less than  20 percent impaired, limited or restricted       Problem List Patient Active Problem List   Diagnosis Date Noted  . Amputation of both hands with complication, subsequent encounter 03/01/2017  . Diabetes ([place]) 02/19/2017  . Status post bilateral below knee amputation ([place]) 02/09/2017  . Wound disruption, post-op, skin, sequela 02/09/2017  . Debility 02/05/2017  . Ganglion upper arm, right   . Thrombosis of both upper extremities   . Suspected heparin induced thrombocytopenia (HIT) in hospitalized patient ([place])   . Gangrene of lower extremity ([place])   . Heparin induced thrombocytopenia (HIT) ([place])   . Tracheostomy status ([place])   . Chest tube in place   . Atherosclerosis of native arteries of extremities with gangrene, left leg ([place])   . Atherosclerosis of native arteries of extremities with gangrene, right leg ([place])   . Acute on chronic diastolic CHF (congestive heart failure) ([place])   . Enteritis due to Clostridium difficile   . Anasarca   . FUO (fever of unknown origin)   . Acute encephalopathy   . Elevated LFTs   . DIC (disseminated intravascular coagulation) ([place]) 10/28/2016  . Postoperative atrial fibrillation ([place]) 10/24/2016  . Cardiogenic shock ([place])   . Mitral regurgitation due to cusp prolapse 10/22/2016  . S/P aortic valve replacement with bioprosthetic valve 10/22/2016  . S/P ascending aortic aneurysm repair 10/22/2016  . S/P mitral valve repair 10/22/2016  . S/P CABG x 3 10/22/2016  . Thrombocytopenia ([place]) 10/22/2016  . Acute combined systolic and diastolic congestive heart failure ([place]) 10/22/2016  . Acute respiratory failure ([place]) 10/22/2016  . Thoracic ascending aortic aneurysm ([place]) 10/21/2016  . Coronary artery disease involving native coronary artery of native heart with unstable angina pectoris ([place]) 10/20/2016  . Severe aortic stenosis by prior echocardiography 10/19/2016  . Unstable angina ([place]) 10/19/2016  . Aortic valve stenosis 10/19/2016  . Bicuspid  aortic valve 10/19/2016  . Trigger ring finger of right hand   . Wears glasses   . Gout   . Hypertension   . Carpal tunnel syndrome of right wrist   . Arthritis   . Snores    PHYSICAL THERAPY DISCHARGE SUMMARY  Visits from Start of Care: 41  Current functional level related to goals / functional outcomes: See above   Remaining deficits: See  above   Education / Equipment: Prosthetic care & HEP. Pt purchased cane. He is currently working on socket revisions.   Plan: Patient agrees to discharge.  Patient goals were met. Patient is being discharged due to meeting the stated rehab goals.  ?????        Windsor Zirkelbach PT, DPT 10/11/2017, 1:07 PM  [facility] [address] [place], [place], [address] Phone: [phone]   Fax:  [phone]  Name: Scott [name] MRN: [id number] Date of Birth: [birth year]

## 2017-10-18 ENCOUNTER — Encounter (INDEPENDENT_AMBULATORY_CARE_PROVIDER_SITE_OTHER): Payer: Self-pay | Admitting: Orthopedic Surgery

## 2017-10-18 ENCOUNTER — Ambulatory Visit (INDEPENDENT_AMBULATORY_CARE_PROVIDER_SITE_OTHER): Payer: Medicare Other | Admitting: Orthopedic Surgery

## 2017-10-18 DIAGNOSIS — I2510 Atherosclerotic heart disease of native coronary artery without angina pectoris: Secondary | ICD-10-CM

## 2017-10-18 DIAGNOSIS — Z89511 Acquired absence of right leg below knee: Secondary | ICD-10-CM

## 2017-10-18 DIAGNOSIS — B369 Superficial mycosis, unspecified: Secondary | ICD-10-CM

## 2017-10-18 DIAGNOSIS — S68411D Complete traumatic amputation of right hand at wrist level, subsequent encounter: Secondary | ICD-10-CM

## 2017-10-18 DIAGNOSIS — Z89512 Acquired absence of left leg below knee: Secondary | ICD-10-CM

## 2017-10-18 DIAGNOSIS — S68412D Complete traumatic amputation of left hand at wrist level, subsequent encounter: Secondary | ICD-10-CM

## 2017-10-18 MED ORDER — DOXYCYCLINE HYCLATE 100 MG PO TABS: 100.0000 mg | ORAL_TABLET | Freq: Two times a day (BID) | ORAL | 0 refills | Status: DC

## 2017-10-18 NOTE — Progress Notes (Signed)
Office Visit Note   Patient: Scott [name]           Date of Birth: Mar 04, 1946           MRN: [id number] Visit Date: 10/18/2017              Requested by: Sasser, Silvestre Moment, MD [place],  [id number] PCP: Manon Hilding, MD  Chief Complaint  Patient presents with  . Right Leg - Follow-up  . Left Leg - Follow-up  . Right Hand - Follow-up  . Left Hand - Follow-up      HPI: Patient is a 71 year old gentleman status post bilateral amputations of his hands and his legs.  Patient has had significant decreased residual volume for his left lower extremity and has been to [place] today for a new test socket.  Patient states that his ringworm has healed up he states that with the prosthesis that he uses on the right hand for eating he is developed a redness area over the index metacarpal.  He is concerned for infection.  Assessment & Plan: Visit Diagnoses:  1. Fungal dermatitis   2. S/P BKA (below knee amputation) bilateral ([place])   3. Amputation of both hands with complication, subsequent encounter     Plan: We will call in a prescription for doxycycline.  By report patient has a history of hives with Bactrim DS.  Patient will use a Band-Aid and antibiotic ointment over the small ulcerative areas on his left transtibial amputation.  Follow-up after 2 weeks of doxycycline.  Follow-Up Instructions: Return in about 2 weeks (around 11/01/2017).   Ortho Exam  Patient is alert, oriented, no adenopathy, well-dressed, normal affect, normal respiratory effort. Examination patient has healing ulcers from the left transtibial amputation will also residual volume.  These are superficial these were about 3 mm in diameter have fibrinous tissue at the base but there is no cellulitis no odor no drainage.  Examination the right hand he has a new area of redness over the index metacarpal there is no fluctuance there is no ascending cellulitis there is no tenderness to palpation.  Differential  diagnosis includes pressure from his prosthesis versus a low-grade infection.  Imaging: No results found. No images are attached to the encounter.  Labs: Lab Results  Component Value Date   HGBA1C 6.6 (H) 10/21/2016   REPTSTATUS 02/06/2017 FINAL 02/01/2017   GRAMSTAIN  02/01/2017    ABUNDANT WBC PRESENT, PREDOMINANTLY PMN MODERATE GRAM NEGATIVE RODS    CULT  02/01/2017    FEW PSEUDOMONAS AERUGINOSA MODERATE BACTEROIDES FRAGILIS BETA LACTAMASE POSITIVE    LABORGA PSEUDOMONAS AERUGINOSA 02/01/2017    @LABSALLVALUES (HGBA1)@  @BMI1 @  Orders:  No orders of the defined types were placed in this encounter.  Meds ordered this encounter  Medications  . doxycycline (VIBRA-TABS) 100 MG tablet    Sig: Take 1 tablet (100 mg total) by mouth 2 (two) times daily.    Dispense:  20 tablet    Refill:  0     Procedures: No procedures performed  Clinical Data: No additional findings.  ROS:  All other systems negative, except as noted in the HPI. Review of Systems  Objective: Vital Signs: There were no vitals taken for this visit.  Specialty Comments:  No specialty comments available.  PMFS History: Patient Active Problem List   Diagnosis Date Noted  . Amputation of both hands with complication, subsequent encounter 03/01/2017  . Diabetes ([place]) 02/19/2017  . Status post bilateral below  knee amputation ([place]) 02/09/2017  . Wound disruption, post-op, skin, sequela 02/09/2017  . Debility 02/05/2017  . Ganglion upper arm, right   . Thrombosis of both upper extremities   . Suspected heparin induced thrombocytopenia (HIT) in hospitalized patient ([place])   . Gangrene of lower extremity ([place])   . Heparin induced thrombocytopenia (HIT) ([place])   . Tracheostomy status ([place])   . Chest tube in place   . Atherosclerosis of native arteries of extremities with gangrene, left leg ([place])   . Atherosclerosis of native arteries of extremities with gangrene, right leg ([place])   . Acute on  chronic diastolic CHF (congestive heart failure) ([place])   . Enteritis due to Clostridium difficile   . Anasarca   . FUO (fever of unknown origin)   . Acute encephalopathy   . Elevated LFTs   . DIC (disseminated intravascular coagulation) ([place]) 10/28/2016  . Postoperative atrial fibrillation ([place]) 10/24/2016  . Cardiogenic shock ([place])   . Mitral regurgitation due to cusp prolapse 10/22/2016  . S/P aortic valve replacement with bioprosthetic valve 10/22/2016  . S/P ascending aortic aneurysm repair 10/22/2016  . S/P mitral valve repair 10/22/2016  . S/P CABG x 3 10/22/2016  . Thrombocytopenia ([place]) 10/22/2016  . Acute combined systolic and diastolic congestive heart failure ([place]) 10/22/2016  . Acute respiratory failure ([place]) 10/22/2016  . Thoracic ascending aortic aneurysm ([place]) 10/21/2016  . Coronary artery disease involving native coronary artery of native heart with unstable angina pectoris ([place]) 10/20/2016  . Severe aortic stenosis by prior echocardiography 10/19/2016  . Unstable angina ([place]) 10/19/2016  . Aortic valve stenosis 10/19/2016  . Bicuspid aortic valve 10/19/2016  . Trigger ring finger of right hand   . Wears glasses   . Gout   . Hypertension   . Carpal tunnel syndrome of right wrist   . Arthritis   . Snores    Past Medical History:  Diagnosis Date  . Arthritis    "hips, shoulders; knees; back" (10/20/2016)  . Bicuspid aortic valve   . BPH (benign prostatic hypertrophy)   . Carpal tunnel syndrome of right wrist   . Coronary artery disease involving native coronary artery of native heart with unstable angina pectoris ([place]) 10/20/2016  . Diverticulitis   . GERD (gastroesophageal reflux disease)   . Gout   . Heart murmur   . Hyperlipemia   . Hypertension   . Postoperative atrial fibrillation ([place]) 10/24/2016  . RLS (restless legs syndrome)   . S/P aortic valve replacement with bioprosthetic valve 10/22/2016   25 mm [facility] Ease bovine pericardial  bioprosthetic tissue valve  . S/P ascending aortic aneurysm repair 10/22/2016   28 mm supracoronary straight graft replacement of ascending thoracic aortic aneurysm  . S/P CABG x 3 10/22/2016   Sequential LIMA to Diag and LAD, SVG to distal LAD, open vein harvest right thigh  . S/P mitral valve repair 10/22/2016   Artificial Gore-tex neochord placement x6 - posterior annuloplasty band placed but removed due to systolic anterior motion of mitral valve  . Severe aortic stenosis   . Snores    Never been tested for sleep apnea  . Thoracic ascending aortic aneurysm ([place]) 10/21/2016  . Thrombocytopenia ([place]) 10/22/2016  . Type II diabetes mellitus (HCC)     Family History  Problem Relation Age of Onset  . Lung cancer Mother   . Clotting disorder Father        No details  . Heart disease Sister 32  Stents  . Cancer Sister        Throat    Past Surgical History:  Procedure Laterality Date  . AMPUTATION Bilateral 12/11/2016   Procedure: AMPUTATION BELOW KNEE BILATERALLY;  Surgeon: [name], MD;  Location: [facility];  Service: Orthopedics;  Laterality: Bilateral;  . AMPUTATION Bilateral 12/11/2016   Procedure: AMPUTATION BILATERAL HANDS EXCEPT RIGHT THUMB;  Surgeon: [name], MD;  Location: [facility];  Service: Orthopedics;  Laterality: Bilateral;  . AORTIC VALVE REPLACEMENT N/A 10/22/2016   Procedure: AORTIC VALVE REPLACEMENT (AVR) WITH SIZE 25 MM MAGNA EASE PERICARDIAL BIOPROSTHESIS - AORTIC;  Surgeon: Rexene Alberts, MD;  Location: [facility];  Service: Open Heart Surgery;  Laterality: N/A;  . BONE EXCISION Right 02/01/2017   Procedure: EXCISION RIGHT INDEX METACARPAL HEAD;  Surgeon: [name], MD;  Location: [facility];  Service: Orthopedics;  Laterality: Right;  . CARDIAC CATHETERIZATION N/A 10/20/2016   Procedure: Right/Left Heart Cath and Coronary Angiography;  Surgeon: Leonie Man, MD;  Location: [place] [facility];  Service: Cardiovascular;  Laterality: N/A;  . CARPAL TUNNEL  RELEASE Right 11/28/2013   Procedure: RIGHT WRIST CARPAL TUNNEL RELEASE;  Surgeon: Lorn Junes, MD;  Location: [facility];  Service: Orthopedics;  Laterality: Right;  . CATARACT EXTRACTION W/ INTRAOCULAR LENS  IMPLANT, BILATERAL Bilateral 1978  . COLONOSCOPY    . CORONARY ARTERY BYPASS GRAFT N/A 10/22/2016   Procedure: CORONARY ARTERY BYPASS GRAFTING (CABG)x 2 WITH LIMA TO DIAGONAL, OPEN  HARVESTING OF RIGHT SAPHENOUS VEIN FOR VEIN GRAFT TO LAD;  Surgeon: Rexene Alberts, MD;  Location: [facility];  Service: Open Heart Surgery;  Laterality: N/A;  . FRACTURE SURGERY    . INGUINAL HERNIA REPAIR Right 1998  . IR GENERIC HISTORICAL  12/07/2016   IR US GUIDE VASC ACCESS RIGHT 12/07/2016 Aletta Edouard, MD [facility]  . IR GENERIC HISTORICAL  12/07/2016   IR RADIOLOGY PERIPHERAL GUIDED IV START 12/07/2016 Aletta Edouard, MD [facility]  . IR GENERIC HISTORICAL  12/07/2016   IR GASTROSTOMY TUBE MOD SED 12/07/2016 Aletta Edouard, MD [facility]  . LIPOMA EXCISION Right 2008   "side of my head"  . MITRAL VALVE REPAIR N/A 10/22/2016   Procedure: MITRAL VALVE REPAIR (MVR) WITH SIZE 30 SORIN ANNULOFLEX ANNULOPLASTY RING WITH SUBSEQUENT REMOVAL OF RING;  Surgeon: Rexene Alberts, MD;  Location: [place];  Service: Open Heart Surgery;  Laterality: N/A;  . PENECTOMY  2007   Peyronie's disease   . PENILE PROSTHESIS IMPLANT  2009  . REMOVAL OF PENILE PROSTHESIS N/A 02/01/2017   Procedure: REMOVAL OF PENILE PROSTHESIS;  Surgeon: Kathie Rhodes, MD;  Location: [place];  Service: Urology;  Laterality: N/A;  . SHOULDER OPEN ROTATOR CUFF REPAIR Right 2006  . STERNAL CLOSURE N/A 10/26/2016   Procedure: STERNAL WASHOUT AND DELAYED PRIMARY CLOSURE;  Surgeon: Rexene Alberts, MD;  Location: [place];  Service: Thoracic;  Laterality: N/A;  . TEE WITHOUT CARDIOVERSION N/A 10/26/2016   Procedure: TRANSESOPHAGEAL ECHOCARDIOGRAM (TEE);  Surgeon: Rexene Alberts, MD;  Location: [facility];  Service: Thoracic;  Laterality:  N/A;  . TEE WITHOUT CARDIOVERSION N/A 10/22/2016   Procedure: TRANSESOPHAGEAL ECHOCARDIOGRAM (TEE);  Surgeon: Rexene Alberts, MD;  Location: [place];  Service: Open Heart Surgery;  Laterality: N/A;  . THORACIC AORTIC ANEURYSM REPAIR  10/22/2016   Procedure: ASCENDING AORTIC  ANEURYSM REPAIR (AAA) WITH 28 MM HEMASHIELD PLATINUM WOVEN DOUBLE VELOUR VASCULAR GRAFT;  Surgeon: Rexene Alberts, MD;  [facility];  Service: Open Heart Surgery;;  . TONSILLECTOMY  ~ 1955  . TRACHEOSTOMY TUBE PLACEMENT N/A 11/09/2016   Procedure: TRACHEOSTOMY;  Surgeon: Rexene Alberts, MD;  Location: [place];  Service: Thoracic;  Laterality: N/A;  . TRANSURETHRAL RESECTION OF PROSTATE  2005  . TRIGGER FINGER RELEASE Bilateral    several lt and rt hands  . TRIGGER FINGER RELEASE Right 11/28/2013   Procedure: RIGHT TRIGGER FINGER  RELEASE (TENDON SHEATH INCISION);  Surgeon: Lorn Junes, MD;  Location: [place];  Service: Orthopedics;  Laterality: Right;  Marland Kitchen VIDEO BRONCHOSCOPY N/A 11/09/2016   Procedure: VIDEO BRONCHOSCOPY;  Surgeon: Rexene Alberts, MD;  Location: [place];  Service: Thoracic;  Laterality: N/A;  . WRIST FRACTURE SURGERY Right ~ 1959   Social History   Occupational History  . Occupation: Designer, multimedia: [employer]    Comment: retired  Tobacco Use  . Smoking status: Former Smoker    Years: 3.00    Types: Pipe, Cigars    Last attempt to quit: 1984    Years since quitting: 34.9  . Smokeless tobacco: Never Used  Substance and Sexual Activity  . Alcohol use: Yes    Alcohol/week: 6.0 oz    Types: 10 Cans of beer per week  . Drug use: No  . Sexual activity: Not Currently

## 2017-10-25 ENCOUNTER — Telehealth (INDEPENDENT_AMBULATORY_CARE_PROVIDER_SITE_OTHER): Payer: Self-pay | Admitting: Orthopedic Surgery

## 2017-10-25 NOTE — Telephone Encounter (Signed)
Last 2 ov notes faxed to [facility] [phone] °

## 2017-10-26 ENCOUNTER — Ambulatory Visit (INDEPENDENT_AMBULATORY_CARE_PROVIDER_SITE_OTHER): Payer: Medicare Other | Admitting: *Deleted

## 2017-10-26 DIAGNOSIS — I4891 Unspecified atrial fibrillation: Secondary | ICD-10-CM

## 2017-10-26 DIAGNOSIS — Q231 Congenital insufficiency of aortic valve: Secondary | ICD-10-CM

## 2017-10-26 DIAGNOSIS — I82603 Acute embolism and thrombosis of unspecified veins of upper extremity, bilateral: Secondary | ICD-10-CM

## 2017-10-26 DIAGNOSIS — I9789 Other postprocedural complications and disorders of the circulatory system, not elsewhere classified: Secondary | ICD-10-CM

## 2017-10-26 DIAGNOSIS — Z9889 Other specified postprocedural states: Secondary | ICD-10-CM | POA: Diagnosis not present

## 2017-10-26 LAB — POCT INR: INR: 2

## 2017-11-03 ENCOUNTER — Ambulatory Visit (INDEPENDENT_AMBULATORY_CARE_PROVIDER_SITE_OTHER): Payer: Medicare Other | Admitting: Orthopedic Surgery

## 2017-11-04 ENCOUNTER — Other Ambulatory Visit: Payer: Self-pay | Admitting: Cardiology

## 2017-11-06 ENCOUNTER — Other Ambulatory Visit: Payer: Self-pay | Admitting: Cardiology

## 2017-11-08 ENCOUNTER — Other Ambulatory Visit: Payer: Self-pay

## 2017-11-08 ENCOUNTER — Encounter: Payer: Self-pay | Admitting: Thoracic Surgery (Cardiothoracic Vascular Surgery)

## 2017-11-08 ENCOUNTER — Ambulatory Visit (INDEPENDENT_AMBULATORY_CARE_PROVIDER_SITE_OTHER): Payer: Medicare Other | Admitting: Thoracic Surgery (Cardiothoracic Vascular Surgery)

## 2017-11-08 VITALS — BP 135/87 | HR 101 | Resp 16 | Ht 69.0 in | Wt 197.0 lb

## 2017-11-08 DIAGNOSIS — I2510 Atherosclerotic heart disease of native coronary artery without angina pectoris: Secondary | ICD-10-CM

## 2017-11-08 DIAGNOSIS — Z8679 Personal history of other diseases of the circulatory system: Secondary | ICD-10-CM

## 2017-11-08 DIAGNOSIS — Z951 Presence of aortocoronary bypass graft: Secondary | ICD-10-CM

## 2017-11-08 DIAGNOSIS — Z9889 Other specified postprocedural states: Secondary | ICD-10-CM

## 2017-11-08 DIAGNOSIS — Z953 Presence of xenogenic heart valve: Secondary | ICD-10-CM | POA: Diagnosis not present

## 2017-11-08 NOTE — Patient Instructions (Signed)

## 2017-11-08 NOTE — Progress Notes (Signed)
[address]       [place] [address]             [phone]     CARDIOTHORACIC SURGERY OFFICE NOTE  Referring Provider is Arnoldo Lenis, MD PCP is Quintin [place] Silvestre Moment, MD   HPI:  Patient is a 71 year old male with complex past medical history including bicuspid aortic valve with aortic stenosis, mitral regurgitation, coronary artery disease, hypertension, type 2 diabetes mellitus, and remote history of tobacco use who presented in November 2017 with unstable angina pectoris. He was diagnosed with severe symptomatic aortic stenosis, coronary artery disease, and ascending thoracic aortic aneurysm. He underwent elective aortic valve replacement using a bioprosthetic tissue valve, supra coronary straight graft repair of ascending thoracic aortic aneurysm, and coronary artery bypass grafting 3 on 10/22/2016. Intraoperative findings were notable for the surprising finding of mitral valve prolapse with multiple ruptured primary chordae tendineae causing severe mitral regurgitation necessitating concomitant mitral valve repair. Subsequently with initial attempts to wean from cardiopulmonary bypass the patient developed severe systolic anterior motion of the mitral valve requiring removal of the posterior annuloplasty band that had initially been placed as part of complex mitral valve repair. The patient's subsequent postoperative convalescence was extremely complicated and prolonged. He developed gangrenous necrosis of fingers and toes on all 4 extremities because of prolonged need for inotropic support. He experienced respiratory failure requiring prolonged ventilatory support and tracheostomy, recurrent paroxysmal atrial fibrillation, and C. difficile colitis. His subsequent convalescence was extremely slow and [facility] several [facility]. He eventually underwent bilateral below-knee amputation as well as  amputation of almost all of the digits on both hands.  He slowly improved and eventually was discharged to the [facility] inpatient rehabilitation facility and eventually he was discharged home with his family.  He was last seen here in our office on Mar 29, 2017.  Since then he has been followed regularly by Dr. Harl Bowie and Dr. Sharol Given.  He walked into our office today for routine follow-up.  He carries a specially designed cane to assist intermittently for balance, but essentially he walks without any type of physical support.  He is getting around remarkably well.  He states that he only gets short of breath with quite strenuous activity, and he reports that his breathing is much improved in comparison with how he felt prior to surgery one year ago.  He never gets any chest pain or chest tightness.  He remains physically limited because of his amputations, but the degree to which he has returned to a reasonably normal functional status is nothing short of inspiring.  He remains chronically anticoagulated using warfarin.  He has not had any bleeding complications.   Current Outpatient Medications  Medication Sig Dispense Refill  . allopurinol (ZYLOPRIM) 300 MG tablet Take 300 mg by mouth daily.    Marland Kitchen aspirin EC 81 MG tablet Take 81 mg by mouth daily.    Marland Kitchen atenolol (TENORMIN) 25 MG tablet TAKE 1 TABLET BY MOUTH DAILY 90 tablet 1  . atorvastatin (LIPITOR) 40 MG tablet TAKE 1 TABLET (40 MG TOTAL) BY MOUTH DAILY AT 6 PM. 90 tablet 1  . digoxin (LANOXIN) 0.125 MG tablet TAKE 1 TABLET (0.125 MG TOTAL) BY MOUTH DAILY. 30 tablet 3  . famotidine (PEPCID) 20 MG tablet Take 20 mg by mouth daily.    . fluticasone (FLONASE) 50 MCG/ACT nasal spray Place 1 spray into both  nostrils as needed for allergies or rhinitis.    . furosemide (LASIX) 40 MG tablet Take 1 tablet (40 mg total) by mouth daily. May take an additional 40 mg as needed for shortness of breath 90 tablet 3  . gabapentin (NEURONTIN) 300 MG capsule TAKE 1  CAPSULE BY MOUTH THREE TIMES A DAY 270 capsule 1  . KLOR-CON M20 20 MEQ tablet TAKE 1 TABLET (20 MEQ TOTAL) BY MOUTH 2 (TWO) TIMES DAILY. 180 tablet 1  . lisinopril (PRINIVIL,ZESTRIL) 2.5 MG tablet TAKE 1 TABLET BY MOUTH EVERY DAY 90 tablet 1  . Melatonin 10 MG CAPS Take 10 mg by mouth at bedtime.     . nitroGLYCERIN (NITROSTAT) 0.4 MG SL tablet Place 0.4 mg under the tongue every 5 (five) minutes as needed.     Marland Kitchen rOPINIRole (REQUIP) 2 MG tablet Take 2 mg by mouth See admin instructions. Takes 37m at 4:30pm and 265mat 9:30pm.    . warfarin (COUMADIN) 2 MG tablet TAKE 1 AND 1/2 TABLETS DAILY EXCEPT 2 TABLETS ON MONDAYS, WEDNESDAYS AND FRIDAYS 60 tablet 3  . amoxicillin (AMOXIL) 500 MG capsule Take 4 capsules (2,000 mg total) by mouth daily. Take 2,000 mg 40 minutes before dental procedure (Patient not taking: Reported on 11/08/2017) 4 capsule 0  . blood glucose meter kit and supplies KIT Dispense based on patient and insurance preference. Use up to four times daily as directed. (FOR ICD-9 250.00, 250.01). (Patient not taking: Reported on 11/08/2017) 1 each 0  . FLUZONE HIGH-DOSE 0.5 ML injection TO BE ADMINISTERED BY PHARMACIST FOR IMMUNIZATION  0  . metFORMIN (GLUCOPHAGE) 500 MG tablet Take 500 mg by mouth daily after supper.      No current facility-administered medications for this visit.       Physical Exam:   BP 135/87 (BP Location: Right Arm, Patient Position: Sitting, Cuff Size: Large)   Pulse (!) 101   Resp 16   Ht '5\' 9"'  (1.753 m)   Wt 197 lb (89.4 kg)   BMI 29.09 kg/m   General:  Well-appearing  Chest:   Clear to auscultation with symmetrical breath sounds  CV:   Irregular rate and rhythm with no murmur appreciated  Incisions:  Completely healed  Abdomen:  Soft nontender  Extremities:  Warm and well perfused, no lower extremity edema  Diagnostic Tests:  n/a   Impression:  Patient is doing impressively well approximately 1 year following very complex cardiac surgery  including aortic valve replacement using a bioprosthetic tissue valve, supra coronary straight graft repair of a descending thoracic aortic aneurysm, mitral valve repair, and coronary artery bypass grafting x3.  His postoperative surgical course was extremely complicated but he survived and has recovered to a remarkably impressive functional status despite requiring multiple amputations and an extremely prolonged physical recovery.  He currently remains in rate controlled atrial fibrillation on warfarin anticoagulation.  He appears quite stable from a cardiac standpoint.   Plan:  We have not recommended any changes the patient's current medications.  He will continue to follow-up with Dr. BrHarl Bowieor long-term management of his cardiac issues.  The patient has been reminded regarding the importance of dental hygiene and the lifelong need for antibiotic prophylaxis for all dental cleanings and other related invasive procedures.  In the future he will call and return to see usKoreanly should specific problems or questions arise.  I spent in excess of 15 minutes during the conduct of this office consultation and >50% of this time involved  direct face-to-face encounter with the patient for counseling and/or coordination of their care.    Valentina Gu. Roxy Manns, MD 11/08/2017 1:15 PM

## 2017-11-12 DIAGNOSIS — Z6837 Body mass index (BMI) 37.0-37.9, adult: Secondary | ICD-10-CM | POA: Diagnosis not present

## 2017-11-12 DIAGNOSIS — L0231 Cutaneous abscess of buttock: Secondary | ICD-10-CM | POA: Diagnosis not present

## 2017-11-18 ENCOUNTER — Ambulatory Visit (INDEPENDENT_AMBULATORY_CARE_PROVIDER_SITE_OTHER): Payer: Medicare Other | Admitting: *Deleted

## 2017-11-18 ENCOUNTER — Telehealth: Payer: Self-pay | Admitting: Cardiology

## 2017-11-18 DIAGNOSIS — Z9889 Other specified postprocedural states: Secondary | ICD-10-CM | POA: Diagnosis not present

## 2017-11-18 DIAGNOSIS — I82603 Acute embolism and thrombosis of unspecified veins of upper extremity, bilateral: Secondary | ICD-10-CM

## 2017-11-18 DIAGNOSIS — Q231 Congenital insufficiency of aortic valve: Secondary | ICD-10-CM

## 2017-11-18 DIAGNOSIS — I9789 Other postprocedural complications and disorders of the circulatory system, not elsewhere classified: Secondary | ICD-10-CM

## 2017-11-18 DIAGNOSIS — I4891 Unspecified atrial fibrillation: Secondary | ICD-10-CM

## 2017-11-18 LAB — POCT INR: INR: 2.4

## 2017-11-18 NOTE — Telephone Encounter (Signed)
Noted  

## 2017-11-18 NOTE — Telephone Encounter (Signed)
PATIENT WALK IN   Changing RX to [facility] as of Nov 23, 2017.  Wants all scripts sent with a 90 days supply when it is time to send Customer service [phone] provider service [phone] / [phone]

## 2017-11-18 NOTE — Patient Instructions (Signed)
Continue coumadin 1 1/2 tablets daily except 2 tablets on Mondays, Wednesdays and Fridays Recheck in 4 weeks.

## 2017-11-27 ENCOUNTER — Other Ambulatory Visit: Payer: Self-pay | Admitting: Cardiology

## 2017-12-01 ENCOUNTER — Other Ambulatory Visit: Payer: Self-pay | Admitting: Cardiology

## 2017-12-01 NOTE — Telephone Encounter (Signed)
Wants all Cardiac Medication to be sent    Changing RX to [facility] as of Nov 23, 2017.  Wants all scripts sent with a 90 days supply when it is time to send Customer service [phone] provider service [phone] / [phone]   Patient walked in Wants someone to call her in the morning to verify his medication before sending over please.

## 2017-12-02 NOTE — Telephone Encounter (Signed)
Pt returned call and voiced understanding

## 2017-12-02 NOTE — Telephone Encounter (Signed)
Spoke with [facility] which is a script savings for members and uses [facility] mail service to send prescriptions. Verified fax/phone/address which is already listed as pt pharmacy

## 2017-12-16 ENCOUNTER — Ambulatory Visit (INDEPENDENT_AMBULATORY_CARE_PROVIDER_SITE_OTHER): Payer: Medicare Other | Admitting: *Deleted

## 2017-12-16 DIAGNOSIS — I4891 Unspecified atrial fibrillation: Secondary | ICD-10-CM | POA: Diagnosis not present

## 2017-12-16 DIAGNOSIS — Z9889 Other specified postprocedural states: Secondary | ICD-10-CM | POA: Diagnosis not present

## 2017-12-16 DIAGNOSIS — Q231 Congenital insufficiency of aortic valve: Secondary | ICD-10-CM

## 2017-12-16 DIAGNOSIS — Z5181 Encounter for therapeutic drug level monitoring: Secondary | ICD-10-CM

## 2017-12-16 DIAGNOSIS — I9789 Other postprocedural complications and disorders of the circulatory system, not elsewhere classified: Secondary | ICD-10-CM | POA: Diagnosis not present

## 2017-12-16 DIAGNOSIS — I82603 Acute embolism and thrombosis of unspecified veins of upper extremity, bilateral: Secondary | ICD-10-CM

## 2017-12-16 LAB — POCT INR: INR: 1.9

## 2017-12-16 MED ORDER — GABAPENTIN 300 MG PO CAPS: 300.0000 mg | ORAL_CAPSULE | Freq: Every day | ORAL | 1 refills | Status: DC

## 2017-12-16 MED ORDER — WARFARIN SODIUM 2 MG PO TABS: ORAL_TABLET | ORAL | 3 refills | Status: DC

## 2017-12-16 NOTE — Patient Instructions (Signed)
Take 2 tablets tonight then increase dose to 2 tablets daily except 1 1/2 tablets on Sundays, Tuesdays and Thursdays Recheck in 3.5 weeks.

## 2017-12-24 ENCOUNTER — Other Ambulatory Visit: Payer: Self-pay | Admitting: *Deleted

## 2017-12-24 MED ORDER — ATORVASTATIN CALCIUM 40 MG PO TABS: 40.0000 mg | ORAL_TABLET | Freq: Every day | ORAL | 1 refills | Status: DC

## 2017-12-24 MED ORDER — FUROSEMIDE 40 MG PO TABS: 40.0000 mg | ORAL_TABLET | Freq: Every day | ORAL | 1 refills | Status: DC

## 2017-12-24 MED ORDER — DIGOXIN 125 MCG PO TABS: 0.1250 mg | ORAL_TABLET | Freq: Every day | ORAL | 1 refills | Status: DC

## 2017-12-24 MED ORDER — LISINOPRIL 2.5 MG PO TABS: 2.5000 mg | ORAL_TABLET | Freq: Every day | ORAL | 1 refills | Status: DC

## 2017-12-24 MED ORDER — ATENOLOL 25 MG PO TABS: 25.0000 mg | ORAL_TABLET | Freq: Every day | ORAL | 1 refills | Status: DC

## 2017-12-24 MED ORDER — POTASSIUM CHLORIDE CRYS ER 20 MEQ PO TBCR: 20.0000 meq | EXTENDED_RELEASE_TABLET | Freq: Two times a day (BID) | ORAL | 1 refills | Status: DC

## 2018-01-11 ENCOUNTER — Encounter: Payer: Self-pay | Admitting: *Deleted

## 2018-01-11 ENCOUNTER — Other Ambulatory Visit: Payer: Self-pay

## 2018-01-11 ENCOUNTER — Ambulatory Visit (INDEPENDENT_AMBULATORY_CARE_PROVIDER_SITE_OTHER): Payer: Medicare Other | Admitting: Cardiology

## 2018-01-11 ENCOUNTER — Ambulatory Visit (INDEPENDENT_AMBULATORY_CARE_PROVIDER_SITE_OTHER): Payer: Medicare Other | Admitting: *Deleted

## 2018-01-11 ENCOUNTER — Encounter: Payer: Self-pay | Admitting: Cardiology

## 2018-01-11 VITALS — BP 116/73 | HR 56 | Ht 69.0 in | Wt 204.8 lb

## 2018-01-11 DIAGNOSIS — I9789 Other postprocedural complications and disorders of the circulatory system, not elsewhere classified: Secondary | ICD-10-CM | POA: Diagnosis not present

## 2018-01-11 DIAGNOSIS — I350 Nonrheumatic aortic (valve) stenosis: Secondary | ICD-10-CM

## 2018-01-11 DIAGNOSIS — I82603 Acute embolism and thrombosis of unspecified veins of upper extremity, bilateral: Secondary | ICD-10-CM | POA: Diagnosis not present

## 2018-01-11 DIAGNOSIS — I4891 Unspecified atrial fibrillation: Secondary | ICD-10-CM

## 2018-01-11 DIAGNOSIS — I2510 Atherosclerotic heart disease of native coronary artery without angina pectoris: Secondary | ICD-10-CM

## 2018-01-11 DIAGNOSIS — Z9889 Other specified postprocedural states: Secondary | ICD-10-CM | POA: Diagnosis not present

## 2018-01-11 DIAGNOSIS — I10 Essential (primary) hypertension: Secondary | ICD-10-CM | POA: Diagnosis not present

## 2018-01-11 DIAGNOSIS — Q231 Congenital insufficiency of aortic valve: Secondary | ICD-10-CM

## 2018-01-11 LAB — POCT INR: INR: 1.9

## 2018-01-11 MED ORDER — WARFARIN SODIUM 2 MG PO TABS: ORAL_TABLET | ORAL | 3 refills | Status: DC

## 2018-01-11 NOTE — Patient Instructions (Signed)
Increase coumadin to 2 tablets daily except 1 1/2 tablets on Sundays Recheck in 3 weeks.

## 2018-01-11 NOTE — Patient Instructions (Signed)

## 2018-01-11 NOTE — Progress Notes (Signed)
Clinical Summary Mr. Sabado is a 72 y.o.male seen today for follow up of the following medical problems.   1. Aortic stenosis - history of bicuspid AV - history of prior AVR, [employer] Magna Ease Pericardial Tissue Valve (size 69mm, model # 3300TFX, serial # [id number]) along with 3 vessel CABG, MV repair, and repair of ascending aortic aneurysm   - OR on 10/22/16 for CABG X 3, AVR, MVR, repair of thoracic ascending aneurysm with placement of VAC on open chest Post op course significant for cardiogenic shock requiring multiple pressors, SAM requiring removal of mitral annuloplasty ring, persistent fevers, encephalopathy, shocked liver, DIC due to consumptive intraoperative coagulopathy, diffuse clotting with ischemia of distal BUE/BLE extremities, A fib, C diff colitis, volume overload and VDRF with difficulty with vent wean requiring tracheostomy. Sternal wound closed on 12/4 and was started on coumadin for RV thrombus and A fib.   [facility] course significant for MRSA bacteremia, pneumonitis, intermittent fevers, dysphagia requiring PEG tube placement on 1/15 by Dr. Kathlene Cote. He developed progressive gangrenous changes of BUE and BLE requiring B-transtibial amputation , left transmetacarpal amputation and Right MCP amputation of right hand by Dr. Sharol Given on 1/19.     - no SOB/DOE - no recent edema - compliant with meds  2. Mitral regurgitation - patient had MV ring placed during his surgery 10/22/17 along with AVR, ascending thoracic aneurysm repair, and CABG.  - ring was subsequently removed during that operation due to significant SAM - 02/15/17 echo shows only mild MR  - no recent symptoms  3. CAD - history of CABG 10/22/2017 (LIMA-Diag and distal LAD, SVG-LAD) 01/2017 echo: 55-60%, no WMAs, grade II diastolic dysfunction. Normal functioning AV.    - no recent chest pain.   4. Ischemic limbs - occurred in setting of cardiogenic shock requiring high dose pressors as  well as DIC - required amputation - followed by ortho.  5.Afib/aflutter - initally had post op afib managed with amiodarone - noted to be in aflutter at clinical f/u. We increased his amio at that time    - no recent palptitations. No bleeding on coumadin.     6. RAthrombus - noted during [place] 10/26/16 TEE - repeat echo did not show evidence of RA thrombus       Past Medical History:  Diagnosis Date  . Arthritis    "hips, shoulders; knees; back" (10/20/2016)  . Bicuspid aortic valve   . BPH (benign prostatic hypertrophy)   . Carpal tunnel syndrome of right wrist   . Coronary artery disease involving native coronary artery of native heart with unstable angina pectoris ([place]) 10/20/2016  . Diverticulitis   . GERD (gastroesophageal reflux disease)   . Gout   . Heart murmur   . Hyperlipemia   . Hypertension   . Postoperative atrial fibrillation ([place]) 10/24/2016  . RLS (restless legs syndrome)   . S/P aortic valve replacement with bioprosthetic valve 10/22/2016   25 mm [facility] Ease bovine pericardial bioprosthetic tissue valve  . S/P ascending aortic aneurysm repair 10/22/2016   28 mm supracoronary straight graft replacement of ascending thoracic aortic aneurysm  . S/P CABG x 3 10/22/2016   Sequential LIMA to Diag and LAD, SVG to distal LAD, open vein harvest right thigh  . S/P mitral valve repair 10/22/2016   Artificial Gore-tex neochord placement x6 - posterior annuloplasty band placed but removed due to systolic anterior motion of mitral valve  . Severe aortic stenosis   . Snores  Never been tested for sleep apnea  . Thoracic ascending aortic aneurysm ([place]) 10/21/2016  . Thrombocytopenia ([place]) 10/22/2016  . Type II diabetes mellitus (HCC)      Allergies  Allergen Reactions  . Doxycycline Hives  . Sulfa Antibiotics Hives     Current Outpatient Medications  Medication Sig Dispense Refill  . allopurinol (ZYLOPRIM) 300 MG tablet Take 300 mg  by mouth daily.    Marland Kitchen aspirin EC 81 MG tablet Take 81 mg by mouth daily.    Marland Kitchen atenolol (TENORMIN) 25 MG tablet Take 1 tablet (25 mg total) by mouth daily. 90 tablet 1  . atorvastatin (LIPITOR) 40 MG tablet Take 1 tablet (40 mg total) by mouth daily at 6 PM. 90 tablet 1  . digoxin (LANOXIN) 0.125 MG tablet Take 1 tablet (0.125 mg total) by mouth daily. 90 tablet 1  . fluticasone (FLONASE) 50 MCG/ACT nasal spray Place 1 spray into both nostrils as needed for allergies or rhinitis.    Marland Kitchen FLUZONE HIGH-DOSE 0.5 ML injection TO BE ADMINISTERED BY PHARMACIST FOR IMMUNIZATION  0  . furosemide (LASIX) 40 MG tablet Take 1 tablet (40 mg total) by mouth daily. May take an additional 40 mg as needed for shortness of breath 90 tablet 1  . gabapentin (NEURONTIN) 300 MG capsule Take 1 capsule (300 mg total) by mouth daily. 270 capsule 1  . lisinopril (PRINIVIL,ZESTRIL) 2.5 MG tablet Take 1 tablet (2.5 mg total) by mouth daily. 90 tablet 1  . Melatonin 10 MG CAPS Take 10 mg by mouth at bedtime.     . metFORMIN (GLUCOPHAGE) 500 MG tablet Take 500 mg by mouth daily after supper.     . nitroGLYCERIN (NITROSTAT) 0.4 MG SL tablet Place 0.4 mg under the tongue every 5 (five) minutes as needed.     . potassium chloride SA (KLOR-CON M20) 20 MEQ tablet Take 1 tablet (20 mEq total) by mouth 2 (two) times daily. 180 tablet 1  . rOPINIRole (REQUIP) 2 MG tablet Take 2 mg by mouth See admin instructions. Takes 2mg  at 4:30pm and 2mg  at 9:30pm.    . warfarin (COUMADIN) 2 MG tablet TAKE 2 TABLETS DAILY EXCEPT 1 1/2 TABLETS ON SUNDAYS, TUESDAYS AND THURSDAYS 60 tablet 3   No current facility-administered medications for this visit.      Past Surgical History:  Procedure Laterality Date  . AMPUTATION Bilateral 12/11/2016   Procedure: AMPUTATION BELOW KNEE BILATERALLY;  Surgeon: [name], MD;  Location: [place];  Service: Orthopedics;  Laterality: Bilateral;  . AMPUTATION Bilateral 12/11/2016   Procedure: AMPUTATION BILATERAL  HANDS EXCEPT RIGHT THUMB;  Surgeon: [name], MD;  Location: [place];  Service: Orthopedics;  Laterality: Bilateral;  . AORTIC VALVE REPLACEMENT N/A 10/22/2016   Procedure: AORTIC VALVE REPLACEMENT (AVR) WITH SIZE 25 MM MAGNA EASE PERICARDIAL BIOPROSTHESIS - AORTIC;  Surgeon: Rexene Alberts, MD;  Location: [place];  Service: Open Heart Surgery;  Laterality: N/A;  . BONE EXCISION Right 02/01/2017   Procedure: EXCISION RIGHT INDEX METACARPAL HEAD;  Surgeon: [name], MD;  Location: [place];  Service: Orthopedics;  Laterality: Right;  . CARDIAC CATHETERIZATION N/A 10/20/2016   Procedure: Right/Left Heart Cath and Coronary Angiography;  Surgeon: Leonie Man, MD;  Location: [place] CV LAB;  Service: Cardiovascular;  Laterality: N/A;  . CARPAL TUNNEL RELEASE Right 11/28/2013   Procedure: RIGHT WRIST CARPAL TUNNEL RELEASE;  Surgeon: Lorn Junes, MD;  Location: [place];  Service: Orthopedics;  Laterality: Right;  . CATARACT EXTRACTION W/ INTRAOCULAR LENS  IMPLANT, BILATERAL Bilateral 1978  . COLONOSCOPY    . CORONARY ARTERY BYPASS GRAFT N/A 10/22/2016   Procedure: CORONARY ARTERY BYPASS GRAFTING (CABG)x 2 WITH LIMA TO DIAGONAL, OPEN  HARVESTING OF RIGHT SAPHENOUS VEIN FOR VEIN GRAFT TO LAD;  Surgeon: Rexene Alberts, MD;  Location: [place];  Service: Open Heart Surgery;  Laterality: N/A;  . FRACTURE SURGERY    . INGUINAL HERNIA REPAIR Right 1998  . IR GENERIC HISTORICAL  12/07/2016   IR US GUIDE VASC ACCESS RIGHT 12/07/2016 Aletta Edouard, MD [facility]INTER[facility]  . IR GENERIC HISTORICAL  12/07/2016   IR RADIOLOGY PERIPHERAL GUIDED IV START 12/07/2016 Aletta Edouard, MD [facility]  . IR GENERIC HISTORICAL  12/07/2016   IR GASTROSTOMY TUBE MOD SED 12/07/2016 Aletta Edouard, MD [facility]  . LIPOMA EXCISION Right 2008   "side of my head"  . MITRAL VALVE REPAIR N/A 10/22/2016   Procedure: MITRAL VALVE REPAIR (MVR) WITH SIZE 30 SORIN ANNULOFLEX ANNULOPLASTY RING WITH SUBSEQUENT  REMOVAL OF RING;  Surgeon: Rexene Alberts, MD;  Location: [place];  Service: Open Heart Surgery;  Laterality: N/A;  . PENECTOMY  2007   Peyronie's disease   . PENILE PROSTHESIS IMPLANT  2009  . REMOVAL OF PENILE PROSTHESIS N/A 02/01/2017   Procedure: REMOVAL OF PENILE PROSTHESIS;  Surgeon: Kathie Rhodes, MD;  Location: [place];  Service: Urology;  Laterality: N/A;  . SHOULDER OPEN ROTATOR CUFF REPAIR Right 2006  . STERNAL CLOSURE N/A 10/26/2016   Procedure: STERNAL WASHOUT AND DELAYED PRIMARY CLOSURE;  Surgeon: Rexene Alberts, MD;  Location: [place];  Service: Thoracic;  Laterality: N/A;  . TEE WITHOUT CARDIOVERSION N/A 10/26/2016   Procedure: TRANSESOPHAGEAL ECHOCARDIOGRAM (TEE);  Surgeon: Rexene Alberts, MD;  Location: [place];  Service: Thoracic;  Laterality: N/A;  . TEE WITHOUT CARDIOVERSION N/A 10/22/2016   Procedure: TRANSESOPHAGEAL ECHOCARDIOGRAM (TEE);  Surgeon: Rexene Alberts, MD;  Location: [place];  Service: Open Heart Surgery;  Laterality: N/A;  . THORACIC AORTIC ANEURYSM REPAIR  10/22/2016   Procedure: ASCENDING AORTIC  ANEURYSM REPAIR (AAA) WITH 28 MM HEMASHIELD PLATINUM WOVEN DOUBLE VELOUR VASCULAR GRAFT;  Surgeon: Rexene Alberts, MD;  Location: [place];  Service: Open Heart Surgery;;  . TONSILLECTOMY  ~ 1955  . TRACHEOSTOMY TUBE PLACEMENT N/A 11/09/2016   Procedure: TRACHEOSTOMY;  Surgeon: Rexene Alberts, MD;  Location: [place];  Service: Thoracic;  Laterality: N/A;  . TRANSURETHRAL RESECTION OF PROSTATE  2005  . TRIGGER FINGER RELEASE Bilateral    several lt and rt hands  . TRIGGER FINGER RELEASE Right 11/28/2013   Procedure: RIGHT TRIGGER FINGER  RELEASE (TENDON SHEATH INCISION);  Surgeon: Lorn Junes, MD;  Location: [place];  Service: Orthopedics;  Laterality: Right;  Marland Kitchen VIDEO BRONCHOSCOPY N/A 11/09/2016   Procedure: VIDEO BRONCHOSCOPY;  Surgeon: Rexene Alberts, MD;  Location: [place];  Service: Thoracic;  Laterality: N/A;  . WRIST FRACTURE SURGERY Right ~ 5631      Allergies  Allergen Reactions  . Doxycycline Hives  . Sulfa Antibiotics Hives      Family History  Problem Relation Age of Onset  . Lung cancer Mother   . Clotting disorder Father        No details  . Heart disease Sister 45       Stents  . Cancer Sister        Throat     Social History Mr.  Kirkendoll reports that he quit smoking about 35 years ago. His smoking use included pipe and cigars. He quit after 3.00 years of use. he has never used smokeless tobacco. Mr. Tedesco reports that he drinks about 6.0 oz of alcohol per week.   Review of Systems CONSTITUTIONAL: No weight loss, fever, chills, weakness or fatigue.  HEENT: Eyes: No visual loss, blurred vision, double vision or yellow sclerae.No hearing loss, sneezing, congestion, runny nose or sore throat.  SKIN: No rash or itching.  CARDIOVASCULAR: per hpi RESPIRATORY: per hpi GASTROINTESTINAL: No anorexia, nausea, vomiting or diarrhea. No abdominal pain or blood.  GENITOURINARY: No burning on urination, no polyuria NEUROLOGICAL: No headache, dizziness, syncope, paralysis, ataxia, numbness or tingling in the extremities. No change in bowel or bladder control.  MUSCULOSKELETAL: No muscle, back pain, joint pain or stiffness.  LYMPHATICS: No enlarged nodes. No history of splenectomy.  PSYCHIATRIC: No history of depression or anxiety.  ENDOCRINOLOGIC: No reports of sweating, cold or heat intolerance. No polyuria or polydipsia.  Marland Kitchen   Physical Examination Vitals:   01/11/18 1502  BP: 116/73  Pulse: (!) 56   Vitals:   01/11/18 1502  Weight: 204 lb 12.8 oz (92.9 kg)  Height: 5\' 9"  (1.753 m)    Gen: resting comfortably, no acute distress HEENT: no scleral icterus, pupils equal round and reactive, no palptable cervical adenopathy,  CV: RRR, no m/r/g, no jvd Resp: Clear to auscultation bilaterally GI: abdomen is soft, non-tender, non-distended, normal bowel sounds, no hepatosplenomegaly MSK: extremities are warm, no  edema.  Skin: warm, no rash Neuro:  no focal deficits Psych: appropriate affect    Assessment and Plan   1. Aortic stenosis - s/p tissue AVR, recent echo shows normal function.  - no recent symptoms, continue to monitor.   2. Mitral regurgitation - MV ring placed and removed during initial surgery due to [facility] - most recent ehco shows mild MR. Likely improved due to decreased pressure across AV valve  -no symptoms, continue to monitor.   3. CAD - s/p CABG, asymptomatic - continue current meds  4. Afib - initially postop afib after cardiac surgery. Was on amio on discharge. However, has converted back to afib at outpatient f/u despite increase in amio last visit. EKG in clinic today shows rate controlled afib.  -continue current meds. He has done well with atenolol and digoxin and thus we have continued, intiailyl on due to soft bp's. Pending bps in the future could consider change in regimen   5. Ischemic limbs - s/p multiple amputations, occurred postop in setting of DIC and pressor use - continue to follow with ortho - has been on coumadin along with aspirin      Arnoldo Lenis, M.D.

## 2018-01-12 ENCOUNTER — Other Ambulatory Visit: Payer: Self-pay | Admitting: Cardiology

## 2018-01-13 ENCOUNTER — Encounter: Payer: Self-pay | Admitting: Cardiology

## 2018-01-28 DIAGNOSIS — I10 Essential (primary) hypertension: Secondary | ICD-10-CM | POA: Diagnosis not present

## 2018-01-28 DIAGNOSIS — E1140 Type 2 diabetes mellitus with diabetic neuropathy, unspecified: Secondary | ICD-10-CM | POA: Diagnosis not present

## 2018-01-28 DIAGNOSIS — E781 Pure hyperglyceridemia: Secondary | ICD-10-CM | POA: Diagnosis not present

## 2018-01-28 DIAGNOSIS — K210 Gastro-esophageal reflux disease with esophagitis: Secondary | ICD-10-CM | POA: Diagnosis not present

## 2018-01-28 DIAGNOSIS — E1151 Type 2 diabetes mellitus with diabetic peripheral angiopathy without gangrene: Secondary | ICD-10-CM | POA: Diagnosis not present

## 2018-01-28 DIAGNOSIS — E1159 Type 2 diabetes mellitus with other circulatory complications: Secondary | ICD-10-CM | POA: Diagnosis not present

## 2018-01-28 DIAGNOSIS — G2581 Restless legs syndrome: Secondary | ICD-10-CM | POA: Diagnosis not present

## 2018-01-28 DIAGNOSIS — E7800 Pure hypercholesterolemia, unspecified: Secondary | ICD-10-CM | POA: Diagnosis not present

## 2018-01-31 ENCOUNTER — Encounter: Payer: Self-pay | Admitting: Occupational Therapy

## 2018-01-31 DIAGNOSIS — R2681 Unsteadiness on feet: Secondary | ICD-10-CM

## 2018-01-31 NOTE — Therapy (Signed)
[facility] [address] [place], [place], [address] Phone: [phone]   Fax:  [phone]  Occupational Therapy Treatment  Patient Details  Name: Scott [name] MRN: [id number] Date of Birth: [birth year] Referring Provider: Meridee Score MD    Encounter Date: 01/31/2018    Past Medical History:  Diagnosis Date  . Arthritis    "hips, shoulders; knees; back" (10/20/2016)  . Bicuspid aortic valve   . BPH (benign prostatic hypertrophy)   . Carpal tunnel syndrome of right wrist   . Coronary artery disease involving native coronary artery of native heart with unstable angina pectoris ([place]) 10/20/2016  . Diverticulitis   . GERD (gastroesophageal reflux disease)   . Gout   . Heart murmur   . Hyperlipemia   . Hypertension   . Postoperative atrial fibrillation ([place]) 10/24/2016  . RLS (restless legs syndrome)   . S/P aortic valve replacement with bioprosthetic valve 10/22/2016   25 mm [facility] Ease bovine pericardial bioprosthetic tissue valve  . S/P ascending aortic aneurysm repair 10/22/2016   28 mm supracoronary straight graft replacement of ascending thoracic aortic aneurysm  . S/P CABG x 3 10/22/2016   Sequential LIMA to Diag and LAD, SVG to distal LAD, open vein harvest right thigh  . S/P mitral valve repair 10/22/2016   Artificial Gore-tex neochord placement x6 - posterior annuloplasty band placed but removed due to systolic anterior motion of mitral valve  . Severe aortic stenosis   . Snores    Never been tested for sleep apnea  . Thoracic ascending aortic aneurysm ([place]) 10/21/2016  . Thrombocytopenia ([place]) 10/22/2016  . Type II diabetes mellitus ([place])     Past Surgical History:  Procedure Laterality Date  . AMPUTATION Bilateral 12/11/2016   Procedure: AMPUTATION BELOW KNEE BILATERALLY;  Surgeon: [name], MD;  Location: [place];  Service: Orthopedics;  Laterality: Bilateral;  . AMPUTATION Bilateral  12/11/2016   Procedure: AMPUTATION BILATERAL HANDS EXCEPT RIGHT THUMB;  Surgeon: [name], MD;  Location: [place];  Service: Orthopedics;  Laterality: Bilateral;  . AORTIC VALVE REPLACEMENT N/A 10/22/2016   Procedure: AORTIC VALVE REPLACEMENT (AVR) WITH SIZE 25 MM MAGNA EASE PERICARDIAL BIOPROSTHESIS - AORTIC;  Surgeon: Rexene Alberts, MD;  Location: [place];  Service: Open Heart Surgery;  Laterality: N/A;  . BONE EXCISION Right 02/01/2017   Procedure: EXCISION RIGHT INDEX METACARPAL HEAD;  Surgeon: [name], MD;  Location: [place];  Service: Orthopedics;  Laterality: Right;  . CARDIAC CATHETERIZATION N/A 10/20/2016   Procedure: Right/Left Heart Cath and Coronary Angiography;  Surgeon: Leonie Man, MD;  Location: [place] [facility];  Service: Cardiovascular;  Laterality: N/A;  . CARPAL TUNNEL RELEASE Right 11/28/2013   Procedure: RIGHT WRIST CARPAL TUNNEL RELEASE;  Surgeon: Lorn Junes, MD;  Location: [place];  Service: Orthopedics;  Laterality: Right;  . CATARACT EXTRACTION W/ INTRAOCULAR LENS  IMPLANT, BILATERAL Bilateral 1978  . COLONOSCOPY    . CORONARY ARTERY BYPASS GRAFT N/A 10/22/2016   Procedure: CORONARY ARTERY BYPASS GRAFTING (CABG)x 2 WITH LIMA TO DIAGONAL, OPEN  HARVESTING OF RIGHT SAPHENOUS VEIN FOR VEIN GRAFT TO LAD;  Surgeon: Rexene Alberts, MD;  Location: [place];  Service: Open Heart Surgery;  Laterality: N/A;  . FRACTURE SURGERY    . INGUINAL HERNIA REPAIR Right 1998  . IR GENERIC HISTORICAL  12/07/2016   IR US GUIDE VASC ACCESS RIGHT 12/07/2016 Aletta Edouard, MD [facility]  . IR GENERIC  HISTORICAL  12/07/2016   IR RADIOLOGY PERIPHERAL GUIDED IV START 12/07/2016 Aletta Edouard, MD [facility]  . IR GENERIC HISTORICAL  12/07/2016   IR GASTROSTOMY TUBE MOD SED 12/07/2016 Aletta Edouard, MD [facility]  . LIPOMA EXCISION Right 2008   "side of my head"  . MITRAL VALVE REPAIR N/A 10/22/2016   Procedure: MITRAL VALVE REPAIR (MVR) WITH SIZE 30 SORIN  ANNULOFLEX ANNULOPLASTY RING WITH SUBSEQUENT REMOVAL OF RING;  Surgeon: Rexene Alberts, MD;  Location: [place];  Service: Open Heart Surgery;  Laterality: N/A;  . PENECTOMY  2007   Peyronie's disease   . PENILE PROSTHESIS IMPLANT  2009  . REMOVAL OF PENILE PROSTHESIS N/A 02/01/2017   Procedure: REMOVAL OF PENILE PROSTHESIS;  Surgeon: Kathie Rhodes, MD;  Location: [place];  Service: Urology;  Laterality: N/A;  . SHOULDER OPEN ROTATOR CUFF REPAIR Right 2006  . STERNAL CLOSURE N/A 10/26/2016   Procedure: STERNAL WASHOUT AND DELAYED PRIMARY CLOSURE;  Surgeon: Rexene Alberts, MD;  Location: [place];  Service: Thoracic;  Laterality: N/A;  . TEE WITHOUT CARDIOVERSION N/A 10/26/2016   Procedure: TRANSESOPHAGEAL ECHOCARDIOGRAM (TEE);  Surgeon: Rexene Alberts, MD;  Location: [place];  Service: Thoracic;  Laterality: N/A;  . TEE WITHOUT CARDIOVERSION N/A 10/22/2016   Procedure: TRANSESOPHAGEAL ECHOCARDIOGRAM (TEE);  Surgeon: Rexene Alberts, MD;  Location: [place];  Service: Open Heart Surgery;  Laterality: N/A;  . THORACIC AORTIC ANEURYSM REPAIR  10/22/2016   Procedure: ASCENDING AORTIC  ANEURYSM REPAIR (AAA) WITH 28 MM HEMASHIELD PLATINUM WOVEN DOUBLE VELOUR VASCULAR GRAFT;  Surgeon: Rexene Alberts, MD;  Location: [place];  Service: Open Heart Surgery;;  . TONSILLECTOMY  ~ 1955  . TRACHEOSTOMY TUBE PLACEMENT N/A 11/09/2016   Procedure: TRACHEOSTOMY;  Surgeon: Rexene Alberts, MD;  Location: [place];  Service: Thoracic;  Laterality: N/A;  . TRANSURETHRAL RESECTION OF PROSTATE  2005  . TRIGGER FINGER RELEASE Bilateral    several lt and rt hands  . TRIGGER FINGER RELEASE Right 11/28/2013   Procedure: RIGHT TRIGGER FINGER  RELEASE (TENDON SHEATH INCISION);  Surgeon: Lorn Junes, MD;  Location: [place];  Service: Orthopedics;  Laterality: Right;  Marland Kitchen VIDEO BRONCHOSCOPY N/A 11/09/2016   Procedure: VIDEO BRONCHOSCOPY;  Surgeon: Rexene Alberts, MD;  Location: [place];  Service: Thoracic;  Laterality: N/A;   . WRIST FRACTURE SURGERY Right ~ 1959    There were no vitals filed for this visit.                          OT Short Term Goals - 01/31/18 1055      OT SHORT TERM GOAL #1   Title  PT and wife will be mod I with home activities program - 06/14/2017    Status  Achieved      OT SHORT TERM GOAL #2   Title  Pt will be supervision for 3 in 1 commode transfers    Status  Achieved      OT SHORT TERM GOAL #3   Title  Pt will demonstrate ability to use opposition splint with prn with basic ADL tasks    Status  Achieved      OT SHORT TERM GOAL #4   Title  Pt will be able to complete simple cold snack prep with min a    Status  Achieved        OT Long Term Goals - 01/31/18 1055  OT LONG TERM GOAL #1   Title  Pt will be mod I with home activities program - 07/21/2017    Status  Achieved      OT LONG TERM GOAL #2   Title  Pt will be mod I with toilet transfers    Status  Achieved      OT LONG TERM GOAL #3   Title  IF pt is able to complete stairs, pt will be mod I with tub bench transfers    Status  Achieved      OT LONG TERM GOAL #4   Title  Pt will verbalize understanding of driving eval information    Status  Achieved      OT LONG TERM GOAL #5   Title  Pt will be mod I with toilet hygiene with AE    Status  Achieved      OT LONG TERM GOAL #6   Title  Pt will be mod I with buttoning, zipping using AE/prostheses    Status  Achieved      OT LONG TERM GOAL #7   Title  Pt will be mod I with cutting food with AE/prostheses    Status  Achieved      OT LONG TERM GOAL #8   Title  Pt will be able to write at 3 sentence level with 100% legibility with AE.     Status  Achieved      OT LONG TERM GOAL  #9   Baseline  Pt will demonstrate ability to use LUE prosthesis as stabilizer during simple functional tasks    Status  Achieved      OT LONG TERM GOAL  #10   TITLE  Pt will be mod I with donning and doffing LUE prosthesis.    Status  Achieved             Plan - 01/31/18 1055    Clinical Impression Statement  Pt has not yet returned to therapy therefore will d/c pt a this time from OT.       Patient will benefit from skilled therapeutic intervention in order to improve the following deficits and impairments:     Visit Diagnosis: Unsteadiness on feet    Problem List Patient Active Problem List   Diagnosis Date Noted  . Amputation of both hands with complication, subsequent encounter 03/01/2017  . Diabetes ([place]) 02/19/2017  . Status post bilateral below knee amputation ([place]) 02/09/2017  . Wound disruption, post-op, skin, sequela 02/09/2017  . Debility 02/05/2017  . Ganglion upper arm, right   . Thrombosis of both upper extremities   . Suspected heparin induced thrombocytopenia (HIT) in hospitalized patient ([place])   . Gangrene of lower extremity ([place])   . Heparin induced thrombocytopenia (HIT) ([place])   . Tracheostomy status ([place])   . Chest tube in place   . Atherosclerosis of native arteries of extremities with gangrene, left leg ([place])   . Atherosclerosis of native arteries of extremities with gangrene, right leg ([place])   . Acute on chronic diastolic CHF (congestive heart failure) ([place])   . Enteritis due to Clostridium difficile   . Anasarca   . FUO (fever of unknown origin)   . Acute encephalopathy   . Elevated LFTs   . DIC (disseminated intravascular coagulation) ([place]) 10/28/2016  . Postoperative atrial fibrillation ([place]) 10/24/2016  . Cardiogenic shock ([place])   . Mitral regurgitation due to cusp prolapse 10/22/2016  . S/P aortic valve replacement with bioprosthetic valve 10/22/2016  .  S/P ascending aortic aneurysm repair 10/22/2016  . S/P mitral valve repair 10/22/2016  . S/P CABG x 3 10/22/2016  . Thrombocytopenia ([place]) 10/22/2016  . Acute combined systolic and diastolic congestive heart failure ([place]) 10/22/2016  . Acute respiratory failure ([place]) 10/22/2016  . Thoracic ascending aortic aneurysm ([place])  10/21/2016  . Coronary artery disease involving native coronary artery of native heart with unstable angina pectoris ([place]) 10/20/2016  . Severe aortic stenosis by prior echocardiography 10/19/2016  . Unstable angina ([place]) 10/19/2016  . Aortic valve stenosis 10/19/2016  . Bicuspid aortic valve 10/19/2016  . Trigger ring finger of right hand   . Wears glasses   . Gout   . Hypertension   . Carpal tunnel syndrome of right wrist   . Arthritis   . Snores    OCCUPATIONAL THERAPY DISCHARGE SUMMARY  Visits from Start of Care: 12 Current functional level related to goals / functional outcomes: See above   Remaining deficits: See above - pt may return to OT when he gets new UE prosthesis   Education / Equipment: ADL equipment, HEP  Plan: Patient agrees to discharge.  Patient goals were met. Patient is being discharged due to meeting the stated rehab goals.  ?????      Quay Burow, OTR/L 01/31/2018, 10:56 AM  [facility] [address] [place], [place], [address] Phone: [phone]   Fax:  [phone]  Name: Scott [name] MRN: [id number] Date of Birth: 06-25-1946

## 2018-02-01 ENCOUNTER — Ambulatory Visit (INDEPENDENT_AMBULATORY_CARE_PROVIDER_SITE_OTHER): Payer: Medicare Other | Admitting: *Deleted

## 2018-02-01 DIAGNOSIS — Z953 Presence of xenogenic heart valve: Secondary | ICD-10-CM

## 2018-02-01 DIAGNOSIS — Z9889 Other specified postprocedural states: Secondary | ICD-10-CM

## 2018-02-01 DIAGNOSIS — I82603 Acute embolism and thrombosis of unspecified veins of upper extremity, bilateral: Secondary | ICD-10-CM | POA: Diagnosis not present

## 2018-02-01 DIAGNOSIS — I4891 Unspecified atrial fibrillation: Secondary | ICD-10-CM | POA: Diagnosis not present

## 2018-02-01 DIAGNOSIS — I9789 Other postprocedural complications and disorders of the circulatory system, not elsewhere classified: Secondary | ICD-10-CM | POA: Diagnosis not present

## 2018-02-01 DIAGNOSIS — Q231 Congenital insufficiency of aortic valve: Secondary | ICD-10-CM | POA: Diagnosis not present

## 2018-02-01 LAB — POCT INR: INR: 2.3

## 2018-02-01 NOTE — Patient Instructions (Signed)
Continue coumadin 2 tablets daily except 1 1/2 tablets on Sundays Recheck in 4 weeks.

## 2018-02-02 DIAGNOSIS — E782 Mixed hyperlipidemia: Secondary | ICD-10-CM | POA: Diagnosis not present

## 2018-02-02 DIAGNOSIS — N401 Enlarged prostate with lower urinary tract symptoms: Secondary | ICD-10-CM | POA: Diagnosis not present

## 2018-02-02 DIAGNOSIS — M1A9XX0 Chronic gout, unspecified, without tophus (tophi): Secondary | ICD-10-CM | POA: Diagnosis not present

## 2018-02-02 DIAGNOSIS — E1140 Type 2 diabetes mellitus with diabetic neuropathy, unspecified: Secondary | ICD-10-CM | POA: Diagnosis not present

## 2018-02-02 DIAGNOSIS — I10 Essential (primary) hypertension: Secondary | ICD-10-CM | POA: Diagnosis not present

## 2018-02-02 DIAGNOSIS — G2581 Restless legs syndrome: Secondary | ICD-10-CM | POA: Diagnosis not present

## 2018-03-01 ENCOUNTER — Ambulatory Visit (INDEPENDENT_AMBULATORY_CARE_PROVIDER_SITE_OTHER): Payer: Medicare Other | Admitting: *Deleted

## 2018-03-01 DIAGNOSIS — Z9889 Other specified postprocedural states: Secondary | ICD-10-CM | POA: Diagnosis not present

## 2018-03-01 DIAGNOSIS — Z5181 Encounter for therapeutic drug level monitoring: Secondary | ICD-10-CM

## 2018-03-01 DIAGNOSIS — I82603 Acute embolism and thrombosis of unspecified veins of upper extremity, bilateral: Secondary | ICD-10-CM

## 2018-03-01 DIAGNOSIS — Q231 Congenital insufficiency of aortic valve: Secondary | ICD-10-CM | POA: Diagnosis not present

## 2018-03-01 DIAGNOSIS — I9789 Other postprocedural complications and disorders of the circulatory system, not elsewhere classified: Secondary | ICD-10-CM

## 2018-03-01 DIAGNOSIS — I4891 Unspecified atrial fibrillation: Secondary | ICD-10-CM | POA: Diagnosis not present

## 2018-03-01 LAB — POCT INR: INR: 2.6

## 2018-03-01 MED ORDER — WARFARIN SODIUM 2 MG PO TABS
ORAL_TABLET | ORAL | 3 refills | Status: DC
Start: 2018-03-01 — End: 2019-01-13

## 2018-03-01 NOTE — Patient Instructions (Signed)
Increase coumadin to 2 tablets daily (per pt request to avoid the 1/2 tablet) Recheck in 4 weeks.

## 2018-03-13 ENCOUNTER — Other Ambulatory Visit: Payer: Self-pay | Admitting: Cardiology

## 2018-03-29 ENCOUNTER — Ambulatory Visit (INDEPENDENT_AMBULATORY_CARE_PROVIDER_SITE_OTHER): Payer: Medicare Other | Admitting: *Deleted

## 2018-03-29 DIAGNOSIS — I82603 Acute embolism and thrombosis of unspecified veins of upper extremity, bilateral: Secondary | ICD-10-CM

## 2018-03-29 DIAGNOSIS — Z5181 Encounter for therapeutic drug level monitoring: Secondary | ICD-10-CM

## 2018-03-29 DIAGNOSIS — Z953 Presence of xenogenic heart valve: Secondary | ICD-10-CM

## 2018-03-29 DIAGNOSIS — Q231 Congenital insufficiency of aortic valve: Secondary | ICD-10-CM | POA: Diagnosis not present

## 2018-03-29 DIAGNOSIS — I4891 Unspecified atrial fibrillation: Secondary | ICD-10-CM | POA: Diagnosis not present

## 2018-03-29 DIAGNOSIS — I9789 Other postprocedural complications and disorders of the circulatory system, not elsewhere classified: Secondary | ICD-10-CM | POA: Diagnosis not present

## 2018-03-29 DIAGNOSIS — Z9889 Other specified postprocedural states: Secondary | ICD-10-CM | POA: Diagnosis not present

## 2018-03-29 LAB — POCT INR: INR: 2.3

## 2018-03-29 NOTE — Patient Instructions (Signed)
Continue coumadin 2 tablets daily.  Recheck in 6 weeks.  

## 2018-04-12 ENCOUNTER — Other Ambulatory Visit (INDEPENDENT_AMBULATORY_CARE_PROVIDER_SITE_OTHER): Payer: Self-pay | Admitting: Family

## 2018-04-15 DIAGNOSIS — L03116 Cellulitis of left lower limb: Secondary | ICD-10-CM | POA: Diagnosis not present

## 2018-04-20 ENCOUNTER — Telehealth (INDEPENDENT_AMBULATORY_CARE_PROVIDER_SITE_OTHER): Payer: Self-pay | Admitting: Orthopedic Surgery

## 2018-04-20 NOTE — Telephone Encounter (Signed)
Last 2 ov notes refaxed to [facility]

## 2018-04-21 DIAGNOSIS — L03116 Cellulitis of left lower limb: Secondary | ICD-10-CM | POA: Diagnosis not present

## 2018-05-04 DIAGNOSIS — D485 Neoplasm of uncertain behavior of skin: Secondary | ICD-10-CM | POA: Diagnosis not present

## 2018-05-04 DIAGNOSIS — L89899 Pressure ulcer of other site, unspecified stage: Secondary | ICD-10-CM | POA: Diagnosis not present

## 2018-05-04 DIAGNOSIS — L814 Other melanin hyperpigmentation: Secondary | ICD-10-CM | POA: Diagnosis not present

## 2018-05-04 DIAGNOSIS — L0100 Impetigo, unspecified: Secondary | ICD-10-CM | POA: Diagnosis not present

## 2018-05-04 DIAGNOSIS — L570 Actinic keratosis: Secondary | ICD-10-CM | POA: Diagnosis not present

## 2018-05-04 DIAGNOSIS — L820 Inflamed seborrheic keratosis: Secondary | ICD-10-CM | POA: Diagnosis not present

## 2018-05-10 ENCOUNTER — Ambulatory Visit (INDEPENDENT_AMBULATORY_CARE_PROVIDER_SITE_OTHER): Payer: Medicare Other | Admitting: *Deleted

## 2018-05-10 DIAGNOSIS — Z9889 Other specified postprocedural states: Secondary | ICD-10-CM | POA: Diagnosis not present

## 2018-05-10 DIAGNOSIS — Q231 Congenital insufficiency of aortic valve: Secondary | ICD-10-CM

## 2018-05-10 DIAGNOSIS — L03119 Cellulitis of unspecified part of limb: Secondary | ICD-10-CM | POA: Diagnosis not present

## 2018-05-10 DIAGNOSIS — Z5181 Encounter for therapeutic drug level monitoring: Secondary | ICD-10-CM | POA: Diagnosis not present

## 2018-05-10 DIAGNOSIS — I4891 Unspecified atrial fibrillation: Secondary | ICD-10-CM

## 2018-05-10 DIAGNOSIS — I82603 Acute embolism and thrombosis of unspecified veins of upper extremity, bilateral: Secondary | ICD-10-CM

## 2018-05-10 DIAGNOSIS — I9789 Other postprocedural complications and disorders of the circulatory system, not elsewhere classified: Secondary | ICD-10-CM | POA: Diagnosis not present

## 2018-05-10 DIAGNOSIS — L271 Localized skin eruption due to drugs and medicaments taken internally: Secondary | ICD-10-CM | POA: Diagnosis not present

## 2018-05-10 LAB — POCT INR: INR: 2.1 (ref 2.0–3.0)

## 2018-05-10 NOTE — Patient Instructions (Signed)
Continue coumadin 2 tablets daily.  Recheck in 6 weeks.  

## 2018-05-17 DIAGNOSIS — L0100 Impetigo, unspecified: Secondary | ICD-10-CM | POA: Diagnosis not present

## 2018-05-17 DIAGNOSIS — D235 Other benign neoplasm of skin of trunk: Secondary | ICD-10-CM | POA: Diagnosis not present

## 2018-06-14 DIAGNOSIS — E11319 Type 2 diabetes mellitus with unspecified diabetic retinopathy without macular edema: Secondary | ICD-10-CM | POA: Diagnosis not present

## 2018-06-20 DIAGNOSIS — E1140 Type 2 diabetes mellitus with diabetic neuropathy, unspecified: Secondary | ICD-10-CM | POA: Diagnosis not present

## 2018-06-20 DIAGNOSIS — E7801 Familial hypercholesterolemia: Secondary | ICD-10-CM | POA: Diagnosis not present

## 2018-06-20 DIAGNOSIS — E1159 Type 2 diabetes mellitus with other circulatory complications: Secondary | ICD-10-CM | POA: Diagnosis not present

## 2018-06-20 DIAGNOSIS — I10 Essential (primary) hypertension: Secondary | ICD-10-CM | POA: Diagnosis not present

## 2018-06-20 DIAGNOSIS — K210 Gastro-esophageal reflux disease with esophagitis: Secondary | ICD-10-CM | POA: Diagnosis not present

## 2018-06-20 DIAGNOSIS — E782 Mixed hyperlipidemia: Secondary | ICD-10-CM | POA: Diagnosis not present

## 2018-06-20 DIAGNOSIS — E1151 Type 2 diabetes mellitus with diabetic peripheral angiopathy without gangrene: Secondary | ICD-10-CM | POA: Diagnosis not present

## 2018-06-20 DIAGNOSIS — G2581 Restless legs syndrome: Secondary | ICD-10-CM | POA: Diagnosis not present

## 2018-06-21 ENCOUNTER — Ambulatory Visit (INDEPENDENT_AMBULATORY_CARE_PROVIDER_SITE_OTHER): Payer: Medicare Other | Admitting: *Deleted

## 2018-06-21 DIAGNOSIS — Z5181 Encounter for therapeutic drug level monitoring: Secondary | ICD-10-CM

## 2018-06-21 DIAGNOSIS — I9789 Other postprocedural complications and disorders of the circulatory system, not elsewhere classified: Secondary | ICD-10-CM

## 2018-06-21 DIAGNOSIS — I4891 Unspecified atrial fibrillation: Secondary | ICD-10-CM

## 2018-06-21 DIAGNOSIS — Z9889 Other specified postprocedural states: Secondary | ICD-10-CM | POA: Diagnosis not present

## 2018-06-21 DIAGNOSIS — E782 Mixed hyperlipidemia: Secondary | ICD-10-CM | POA: Diagnosis not present

## 2018-06-21 DIAGNOSIS — Q231 Congenital insufficiency of aortic valve: Secondary | ICD-10-CM | POA: Diagnosis not present

## 2018-06-21 DIAGNOSIS — I82603 Acute embolism and thrombosis of unspecified veins of upper extremity, bilateral: Secondary | ICD-10-CM | POA: Diagnosis not present

## 2018-06-21 DIAGNOSIS — E1121 Type 2 diabetes mellitus with diabetic nephropathy: Secondary | ICD-10-CM | POA: Diagnosis not present

## 2018-06-21 DIAGNOSIS — Z953 Presence of xenogenic heart valve: Secondary | ICD-10-CM | POA: Diagnosis not present

## 2018-06-21 DIAGNOSIS — I10 Essential (primary) hypertension: Secondary | ICD-10-CM | POA: Diagnosis not present

## 2018-06-21 LAB — POCT INR: INR: 2.2 (ref 2.0–3.0)

## 2018-06-21 NOTE — Patient Instructions (Signed)
Continue coumadin 2 tablets daily.  Recheck in 6 weeks.  

## 2018-06-23 DIAGNOSIS — N401 Enlarged prostate with lower urinary tract symptoms: Secondary | ICD-10-CM | POA: Diagnosis not present

## 2018-06-23 DIAGNOSIS — E782 Mixed hyperlipidemia: Secondary | ICD-10-CM | POA: Diagnosis not present

## 2018-06-23 DIAGNOSIS — G2581 Restless legs syndrome: Secondary | ICD-10-CM | POA: Diagnosis not present

## 2018-06-23 DIAGNOSIS — I10 Essential (primary) hypertension: Secondary | ICD-10-CM | POA: Diagnosis not present

## 2018-06-23 DIAGNOSIS — E1140 Type 2 diabetes mellitus with diabetic neuropathy, unspecified: Secondary | ICD-10-CM | POA: Diagnosis not present

## 2018-06-23 DIAGNOSIS — Z6830 Body mass index (BMI) 30.0-30.9, adult: Secondary | ICD-10-CM | POA: Diagnosis not present

## 2018-06-23 DIAGNOSIS — M1A9XX0 Chronic gout, unspecified, without tophus (tophi): Secondary | ICD-10-CM | POA: Diagnosis not present

## 2018-07-04 ENCOUNTER — Other Ambulatory Visit: Payer: Self-pay | Admitting: *Deleted

## 2018-07-04 MED ORDER — DIGOXIN 125 MCG PO TABS: 0.1250 mg | ORAL_TABLET | Freq: Every day | ORAL | 3 refills | Status: DC

## 2018-07-04 MED ORDER — FUROSEMIDE 40 MG PO TABS: 40.0000 mg | ORAL_TABLET | Freq: Every day | ORAL | 3 refills | Status: DC

## 2018-07-04 MED ORDER — LISINOPRIL 2.5 MG PO TABS: 2.5000 mg | ORAL_TABLET | Freq: Every day | ORAL | 3 refills | Status: DC

## 2018-07-04 MED ORDER — ATENOLOL 25 MG PO TABS: 25.0000 mg | ORAL_TABLET | Freq: Every day | ORAL | 3 refills | Status: DC

## 2018-07-04 MED ORDER — ATORVASTATIN CALCIUM 40 MG PO TABS: 40.0000 mg | ORAL_TABLET | Freq: Every day | ORAL | 3 refills | Status: DC

## 2018-07-27 ENCOUNTER — Encounter: Payer: Self-pay | Admitting: Cardiology

## 2018-07-27 ENCOUNTER — Ambulatory Visit (INDEPENDENT_AMBULATORY_CARE_PROVIDER_SITE_OTHER): Payer: Medicare Other | Admitting: Cardiology

## 2018-07-27 VITALS — BP 134/81 | HR 94 | Ht 69.0 in | Wt 208.8 lb

## 2018-07-27 DIAGNOSIS — I4891 Unspecified atrial fibrillation: Secondary | ICD-10-CM | POA: Diagnosis not present

## 2018-07-27 DIAGNOSIS — I2510 Atherosclerotic heart disease of native coronary artery without angina pectoris: Secondary | ICD-10-CM

## 2018-07-27 DIAGNOSIS — Z952 Presence of prosthetic heart valve: Secondary | ICD-10-CM

## 2018-07-27 MED ORDER — NITROGLYCERIN 0.4 MG SL SUBL: 0.4000 mg | SUBLINGUAL_TABLET | SUBLINGUAL | 3 refills | Status: DC | PRN

## 2018-07-27 NOTE — Progress Notes (Signed)
Clinical Summary Mr. Fanguy is a 72 y.o.male seen today for follow up of the following medical problems.   1. Aortic stenosis - history of bicuspid AV - history of prior AVR, [facility] Magna Ease Pericardial Tissue Valve (size 6mm, model # 3300TFX, serial # [id number]) along with 3 vessel CABG, MV repair, and repair of ascending aortic aneurysm   - OR on 10/22/16 for CABG X 3, AVR, MVR, repair of thoracic ascending aneurysm with placement of VAC on open chest Post op course significant for cardiogenic shock requiring multiple pressors, SAM requiring removal of mitral annuloplasty ring, persistent fevers, encephalopathy, shocked liver, DIC due to consumptive intraoperative coagulopathy, diffuse clotting with ischemia of distal BUE/BLE extremities, A fib, C diff colitis, volume overload and VDRF with difficulty with vent wean requiring tracheostomy. Sternal wound closed on 12/4 and was started on coumadin for RV thrombus and A fib.   [facility] course significant for MRSA bacteremia, pneumonitis, intermittent fevers, dysphagia requiring PEG tube placement on 1/15 by Dr. Kathlene Cote. He developed progressive gangrenous changes of BUE and BLE requiring B-transtibial amputation , left transmetacarpal amputation and Right MCP amputation of right hand by Dr. Sharol Given on 1/19.     - no recent SOb/DOE. No edema - compliant with meds   2. Mitral regurgitation - patient had MV ring placed during his surgery 10/22/17 along with AVR, ascending thoracic aneurysm repair, and CABG.  - ring was subsequently removed during that operation due to significant SAM - 02/15/17 echo shows only mild MR  - denies any recent symptoms.   3. CAD - history of CABG 10/22/2017 (LIMA-Diag and distal LAD, SVG-LAD) 01/2017 echo: 55-60%, no WMAs, grade II diastolic dysfunction. Normal functioning AV.    - no recent chest pain.   4. Ischemic limbs - occurred in setting of cardiogenic shock requiring high dose  pressors as well as DIC - required amputation - followed by ortho.  5.Afib/aflutter - initally had post op afib  - noted to be in aflutter at clinical f/u and thus was committed to long term anticoagulation  - no bleeding on coumadin.  - no recent palpiations.     6. RAthrombus - noted during [place] 10/26/16 TEE - repeat echo did not show evidence of RA thrombus   Past Medical History:  Diagnosis Date  . Arthritis    "hips, shoulders; knees; back" (10/20/2016)  . Bicuspid aortic valve   . BPH (benign prostatic hypertrophy)   . Carpal tunnel syndrome of right wrist   . Coronary artery disease involving native coronary artery of native heart with unstable angina pectoris ([place]) 10/20/2016  . Diverticulitis   . GERD (gastroesophageal reflux disease)   . Gout   . Heart murmur   . Hyperlipemia   . Hypertension   . Postoperative atrial fibrillation ([place]) 10/24/2016  . RLS (restless legs syndrome)   . S/P aortic valve replacement with bioprosthetic valve 10/22/2016   25 mm [facility] Ease bovine pericardial bioprosthetic tissue valve  . S/P ascending aortic aneurysm repair 10/22/2016   28 mm supracoronary straight graft replacement of ascending thoracic aortic aneurysm  . S/P CABG x 3 10/22/2016   Sequential LIMA to Diag and LAD, SVG to distal LAD, open vein harvest right thigh  . S/P mitral valve repair 10/22/2016   Artificial Gore-tex neochord placement x6 - posterior annuloplasty band placed but removed due to systolic anterior motion of mitral valve  . Severe aortic stenosis   . Snores    Never been  tested for sleep apnea  . Thoracic ascending aortic aneurysm ([place]) 10/21/2016  . Thrombocytopenia ([place]) 10/22/2016  . Type II diabetes mellitus (H[facility])      Allergies  Allergen Reactions  . Doxycycline Hives  . Sulfa Antibiotics Hives     Current Outpatient Medications  Medication Sig Dispense Refill  . allopurinol (ZYLOPRIM) 300 MG tablet Take 300 mg by mouth  daily.    Marland Kitchen aspirin EC 81 MG tablet Take 81 mg by mouth daily.    Marland Kitchen atenolol (TENORMIN) 25 MG tablet Take 1 tablet (25 mg total) by mouth daily. 90 tablet 3  . atorvastatin (LIPITOR) 40 MG tablet Take 1 tablet (40 mg total) by mouth daily at 6 PM. 90 tablet 3  . digoxin (LANOXIN) 0.125 MG tablet Take 1 tablet (0.125 mg total) by mouth daily. 90 tablet 3  . fluticasone (FLONASE) 50 MCG/ACT nasal spray Place 1 spray into both nostrils as needed for allergies or rhinitis.    Marland Kitchen FLUZONE HIGH-DOSE 0.5 ML injection TO BE ADMINISTERED BY PHARMACIST FOR IMMUNIZATION  0  . furosemide (LASIX) 40 MG tablet Take 1 tablet (40 mg total) by mouth daily. May take an additional 40 mg as needed for shortness of breath 90 tablet 3  . gabapentin (NEURONTIN) 300 MG capsule TAKE 1 CAPSULE BY MOUTH THREE TIMES A DAY 270 capsule 1  . lisinopril (PRINIVIL,ZESTRIL) 2.5 MG tablet Take 1 tablet (2.5 mg total) by mouth daily. 90 tablet 3  . Melatonin 10 MG CAPS Take 10 mg by mouth at bedtime.     . metFORMIN (GLUCOPHAGE) 500 MG tablet Take 500 mg by mouth 2 (two) times daily with a meal.     . nitroGLYCERIN (NITROSTAT) 0.4 MG SL tablet Place 0.4 mg under the tongue every 5 (five) minutes as needed.     . potassium chloride SA (KLOR-CON M20) 20 MEQ tablet Take 1 tablet (20 mEq total) by mouth 2 (two) times daily. 180 tablet 1  . rOPINIRole (REQUIP) 2 MG tablet Take 2 mg by mouth See admin instructions. Takes 2mg  at 4:30pm and 2mg  at 9:30pm.    . tamsulosin (FLOMAX) 0.4 MG CAPS capsule Take 0.4 mg by mouth daily.    Marland Kitchen warfarin (COUMADIN) 2 MG tablet TAKE 2 TABLETS DAILY OR AS DIRECTED 180 tablet 3  . warfarin (COUMADIN) 2 MG tablet Take coumadin 2 tablets daily 60 tablet 3   No current facility-administered medications for this visit.      Past Surgical History:  Procedure Laterality Date  . AMPUTATION Bilateral 12/11/2016   Procedure: AMPUTATION BELOW KNEE BILATERALLY;  Surgeon: [name], MD;  Location: [place];   Service: Orthopedics;  Laterality: Bilateral;  . AMPUTATION Bilateral 12/11/2016   Procedure: AMPUTATION BILATERAL HANDS EXCEPT RIGHT THUMB;  Surgeon: [name], MD;  Location: [place];  Service: Orthopedics;  Laterality: Bilateral;  . AORTIC VALVE REPLACEMENT N/A 10/22/2016   Procedure: AORTIC VALVE REPLACEMENT (AVR) WITH SIZE 25 MM MAGNA EASE PERICARDIAL BIOPROSTHESIS - AORTIC;  Surgeon: Rexene Alberts, MD;  Location: [place];  Service: Open Heart Surgery;  Laterality: N/A;  . BONE EXCISION Right 02/01/2017   Procedure: EXCISION RIGHT INDEX METACARPAL HEAD;  Surgeon: [name], MD;  Location: [place];  Service: Orthopedics;  Laterality: Right;  . CARDIAC CATHETERIZATION N/A 10/20/2016   Procedure: Right/Left Heart Cath and Coronary Angiography;  Surgeon: Leonie Man, MD;  Location: [place] [facility];  Service: Cardiovascular;  Laterality: N/A;  . CARPAL TUNNEL  RELEASE Right 11/28/2013   Procedure: RIGHT WRIST CARPAL TUNNEL RELEASE;  Surgeon: Lorn Junes, MD;  Location: [place];  Service: Orthopedics;  Laterality: Right;  . CATARACT EXTRACTION W/ INTRAOCULAR LENS  IMPLANT, BILATERAL Bilateral 1978  . COLONOSCOPY    . CORONARY ARTERY BYPASS GRAFT N/A 10/22/2016   Procedure: CORONARY ARTERY BYPASS GRAFTING (CABG)x 2 WITH LIMA TO DIAGONAL, OPEN  HARVESTING OF RIGHT SAPHENOUS VEIN FOR VEIN GRAFT TO LAD;  Surgeon: Rexene Alberts, MD;  Location: [place];  Service: Open Heart Surgery;  Laterality: N/A;  . FRACTURE SURGERY    . INGUINAL HERNIA REPAIR Right 1998  . IR GENERIC HISTORICAL  12/07/2016   IR US GUIDE VASC ACCESS RIGHT 12/07/2016 Aletta Edouard, MD [facility]  . IR GENERIC HISTORICAL  12/07/2016   IR RADIOLOGY PERIPHERAL GUIDED IV START 12/07/2016 Aletta Edouard, MD [facility]  . IR GENERIC HISTORICAL  12/07/2016   IR GASTROSTOMY TUBE MOD SED 12/07/2016 Aletta Edouard, MD [facility]  . LIPOMA EXCISION Right 2008   "side of my head"  . MITRAL VALVE REPAIR N/A  10/22/2016   Procedure: MITRAL VALVE REPAIR (MVR) WITH SIZE 30 SORIN ANNULOFLEX ANNULOPLASTY RING WITH SUBSEQUENT REMOVAL OF RING;  Surgeon: Rexene Alberts, MD;  Location: [place];  Service: Open Heart Surgery;  Laterality: N/A;  . PENECTOMY  2007   Peyronie's disease   . PENILE PROSTHESIS IMPLANT  2009  . REMOVAL OF PENILE PROSTHESIS N/A 02/01/2017   Procedure: REMOVAL OF PENILE PROSTHESIS;  Surgeon: Kathie Rhodes, MD;  Location: [place];  Service: Urology;  Laterality: N/A;  . SHOULDER OPEN ROTATOR CUFF REPAIR Right 2006  . STERNAL CLOSURE N/A 10/26/2016   Procedure: STERNAL WASHOUT AND DELAYED PRIMARY CLOSURE;  Surgeon: Rexene Alberts, MD;  Location: [place];  Service: Thoracic;  Laterality: N/A;  . TEE WITHOUT CARDIOVERSION N/A 10/26/2016   Procedure: TRANSESOPHAGEAL ECHOCARDIOGRAM (TEE);  Surgeon: Rexene Alberts, MD;  Location: [place];  Service: Thoracic;  Laterality: N/A;  . TEE WITHOUT CARDIOVERSION N/A 10/22/2016   Procedure: TRANSESOPHAGEAL ECHOCARDIOGRAM (TEE);  Surgeon: Rexene Alberts, MD;  Location: [place];  Service: Open Heart Surgery;  Laterality: N/A;  . THORACIC AORTIC ANEURYSM REPAIR  10/22/2016   Procedure: ASCENDING AORTIC  ANEURYSM REPAIR (AAA) WITH 28 MM HEMASHIELD PLATINUM WOVEN DOUBLE VELOUR VASCULAR GRAFT;  Surgeon: Rexene Alberts, MD;  Location: [place];  Service: Open Heart Surgery;;  . TONSILLECTOMY  ~ 1955  . TRACHEOSTOMY TUBE PLACEMENT N/A 11/09/2016   Procedure: TRACHEOSTOMY;  Surgeon: Rexene Alberts, MD;  Location: [place];  Service: Thoracic;  Laterality: N/A;  . TRANSURETHRAL RESECTION OF PROSTATE  2005  . TRIGGER FINGER RELEASE Bilateral    several lt and rt hands  . TRIGGER FINGER RELEASE Right 11/28/2013   Procedure: RIGHT TRIGGER FINGER  RELEASE (TENDON SHEATH INCISION);  Surgeon: Lorn Junes, MD;  Location: [place];  Service: Orthopedics;  Laterality: Right;  Marland Kitchen VIDEO BRONCHOSCOPY N/A 11/09/2016   Procedure: VIDEO BRONCHOSCOPY;  Surgeon:  Rexene Alberts, MD;  Location: [place];  Service: Thoracic;  Laterality: N/A;  . WRIST FRACTURE SURGERY Right ~ 8101     Allergies  Allergen Reactions  . Doxycycline Hives  . Sulfa Antibiotics Hives      Family History  Problem Relation Age of Onset  . Lung cancer Mother   . Clotting disorder Father        No details  . Heart disease Sister  50       Stents  . Cancer Sister        Throat     Social History Mr. Gidney reports that he quit smoking about 35 years ago. His smoking use included pipe and cigars. He quit after 3.00 years of use. He has never used smokeless tobacco. Mr. Breithaupt reports that he drinks about 10.0 standard drinks of alcohol per week.   Review of Systems CONSTITUTIONAL: No weight loss, fever, chills, weakness or fatigue.  HEENT: Eyes: No visual loss, blurred vision, double vision or yellow sclerae.No hearing loss, sneezing, congestion, runny nose or sore throat.  SKIN: No rash or itching.  CARDIOVASCULAR: per hpi RESPIRATORY: No shortness of breath, cough or sputum.  GASTROINTESTINAL: No anorexia, nausea, vomiting or diarrhea. No abdominal pain or blood.  GENITOURINARY: No burning on urination, no polyuria NEUROLOGICAL: No headache, dizziness, syncope, paralysis, ataxia, numbness or tingling in the extremities. No change in bowel or bladder control.  MUSCULOSKELETAL: No muscle, back pain, joint pain or stiffness.  LYMPHATICS: No enlarged nodes. No history of splenectomy.  PSYCHIATRIC: No history of depression or anxiety.  ENDOCRINOLOGIC: No reports of sweating, cold or heat intolerance. No polyuria or polydipsia.  Marland Kitchen   Physical Examination Vitals:   07/27/18 1056  BP: 134/81  Pulse: 94   Vitals:   07/27/18 1056  Weight: 208 lb 12.8 oz (94.7 kg)  Height: 5\' 9"  (1.753 m)    Gen: resting comfortably, no acute distress HEENT: no scleral icterus, pupils equal round and reactive, no palptable cervical adenopathy,  CV: RRR, 2/6 systolic murmur  rusb, 2/6 systolic murmur at apex, no jvd Resp: Clear to auscultation bilaterally GI: abdomen is soft, non-tender, non-distended, normal bowel sounds, no hepatosplenomegaly MSK: extremities are warm, no edema.  Skin: warm, no rash Neuro:  no focal deficits Psych: appropriate affect     Assessment and Plan   1. Aortic stenosis - s/p tissue AVR, most recent echo shows normal function.  - doing well without symptoms, continue to monitor.   2. Mitral regurgitation - MV ring placed and removed during initial surgery due to [facility] - most recent ehco shows mild MR. Likely improved due to decreased pressure across AV valve  -no recent symptoms, continue to monitor.   3. CAD - s/p CABG. Denies any chest pain, continue current meds   4.Afib -continue current meds. He has done well with atenolol and digoxin and thus we have continued, intiailyl on due to soft bp's. Pending bps in the future could consider change in regimen  - continue current meds at this time.    5. Ischemic limbs - s/p multiple amputations, occurred postop in setting of DIC and pressor use - continue to follow with ortho - has been on coumadin along with aspirin   F/u 6 months  Arnoldo Lenis, M.D.

## 2018-07-27 NOTE — Patient Instructions (Signed)

## 2018-07-28 ENCOUNTER — Encounter: Payer: Self-pay | Admitting: *Deleted

## 2018-08-02 ENCOUNTER — Ambulatory Visit (INDEPENDENT_AMBULATORY_CARE_PROVIDER_SITE_OTHER): Payer: Medicare Other | Admitting: *Deleted

## 2018-08-02 DIAGNOSIS — I9789 Other postprocedural complications and disorders of the circulatory system, not elsewhere classified: Secondary | ICD-10-CM

## 2018-08-02 DIAGNOSIS — I82603 Acute embolism and thrombosis of unspecified veins of upper extremity, bilateral: Secondary | ICD-10-CM | POA: Diagnosis not present

## 2018-08-02 DIAGNOSIS — Z5181 Encounter for therapeutic drug level monitoring: Secondary | ICD-10-CM | POA: Diagnosis not present

## 2018-08-02 DIAGNOSIS — I4891 Unspecified atrial fibrillation: Secondary | ICD-10-CM | POA: Diagnosis not present

## 2018-08-02 DIAGNOSIS — Q231 Congenital insufficiency of aortic valve: Secondary | ICD-10-CM | POA: Diagnosis not present

## 2018-08-02 DIAGNOSIS — Z9889 Other specified postprocedural states: Secondary | ICD-10-CM

## 2018-08-02 DIAGNOSIS — Q2381 Bicuspid aortic valve: Secondary | ICD-10-CM

## 2018-08-02 LAB — POCT INR: INR: 2 (ref 2.0–3.0)

## 2018-08-02 NOTE — Patient Instructions (Signed)
Take coumadin 3 tablets tonight then resume 2 tablets daily Recheck in 6 weeks.

## 2018-08-07 ENCOUNTER — Other Ambulatory Visit: Payer: Self-pay | Admitting: Cardiology

## 2018-08-10 DIAGNOSIS — Z23 Encounter for immunization: Secondary | ICD-10-CM | POA: Diagnosis not present

## 2018-08-22 DIAGNOSIS — E782 Mixed hyperlipidemia: Secondary | ICD-10-CM | POA: Diagnosis not present

## 2018-08-22 DIAGNOSIS — E1140 Type 2 diabetes mellitus with diabetic neuropathy, unspecified: Secondary | ICD-10-CM | POA: Diagnosis not present

## 2018-09-01 DIAGNOSIS — S68411A Complete traumatic amputation of right hand at wrist level, initial encounter: Secondary | ICD-10-CM | POA: Diagnosis not present

## 2018-09-01 DIAGNOSIS — Z89512 Acquired absence of left leg below knee: Secondary | ICD-10-CM | POA: Diagnosis not present

## 2018-09-01 DIAGNOSIS — Z89511 Acquired absence of right leg below knee: Secondary | ICD-10-CM | POA: Diagnosis not present

## 2018-09-01 DIAGNOSIS — N390 Urinary tract infection, site not specified: Secondary | ICD-10-CM | POA: Diagnosis not present

## 2018-09-01 DIAGNOSIS — Z6830 Body mass index (BMI) 30.0-30.9, adult: Secondary | ICD-10-CM | POA: Diagnosis not present

## 2018-09-01 DIAGNOSIS — I998 Other disorder of circulatory system: Secondary | ICD-10-CM | POA: Diagnosis not present

## 2018-09-01 DIAGNOSIS — S68412A Complete traumatic amputation of left hand at wrist level, initial encounter: Secondary | ICD-10-CM | POA: Diagnosis not present

## 2018-09-01 DIAGNOSIS — I739 Peripheral vascular disease, unspecified: Secondary | ICD-10-CM | POA: Diagnosis not present

## 2018-09-13 ENCOUNTER — Ambulatory Visit (INDEPENDENT_AMBULATORY_CARE_PROVIDER_SITE_OTHER): Payer: Medicare Other | Admitting: *Deleted

## 2018-09-13 DIAGNOSIS — I82603 Acute embolism and thrombosis of unspecified veins of upper extremity, bilateral: Secondary | ICD-10-CM | POA: Diagnosis not present

## 2018-09-13 DIAGNOSIS — I4891 Unspecified atrial fibrillation: Secondary | ICD-10-CM | POA: Diagnosis not present

## 2018-09-13 DIAGNOSIS — Q231 Congenital insufficiency of aortic valve: Secondary | ICD-10-CM | POA: Diagnosis not present

## 2018-09-13 DIAGNOSIS — Z5181 Encounter for therapeutic drug level monitoring: Secondary | ICD-10-CM | POA: Diagnosis not present

## 2018-09-13 DIAGNOSIS — Z953 Presence of xenogenic heart valve: Secondary | ICD-10-CM

## 2018-09-13 DIAGNOSIS — Z9889 Other specified postprocedural states: Secondary | ICD-10-CM

## 2018-09-13 DIAGNOSIS — I9789 Other postprocedural complications and disorders of the circulatory system, not elsewhere classified: Secondary | ICD-10-CM

## 2018-09-13 LAB — POCT INR: INR: 2.2 (ref 2.0–3.0)

## 2018-09-13 NOTE — Patient Instructions (Signed)
Continue coumadin 2 tablets daily.  Recheck in 6 weeks.  

## 2018-09-21 IMAGING — CR DG CHEST 1V PORT
1 series · 1 of 1 positions shown · non-contrast
Comparison: 11/03/2006.

CLINICAL DATA: Intubation.

EXAM:
PORTABLE CHEST 1 VIEW

[AP]
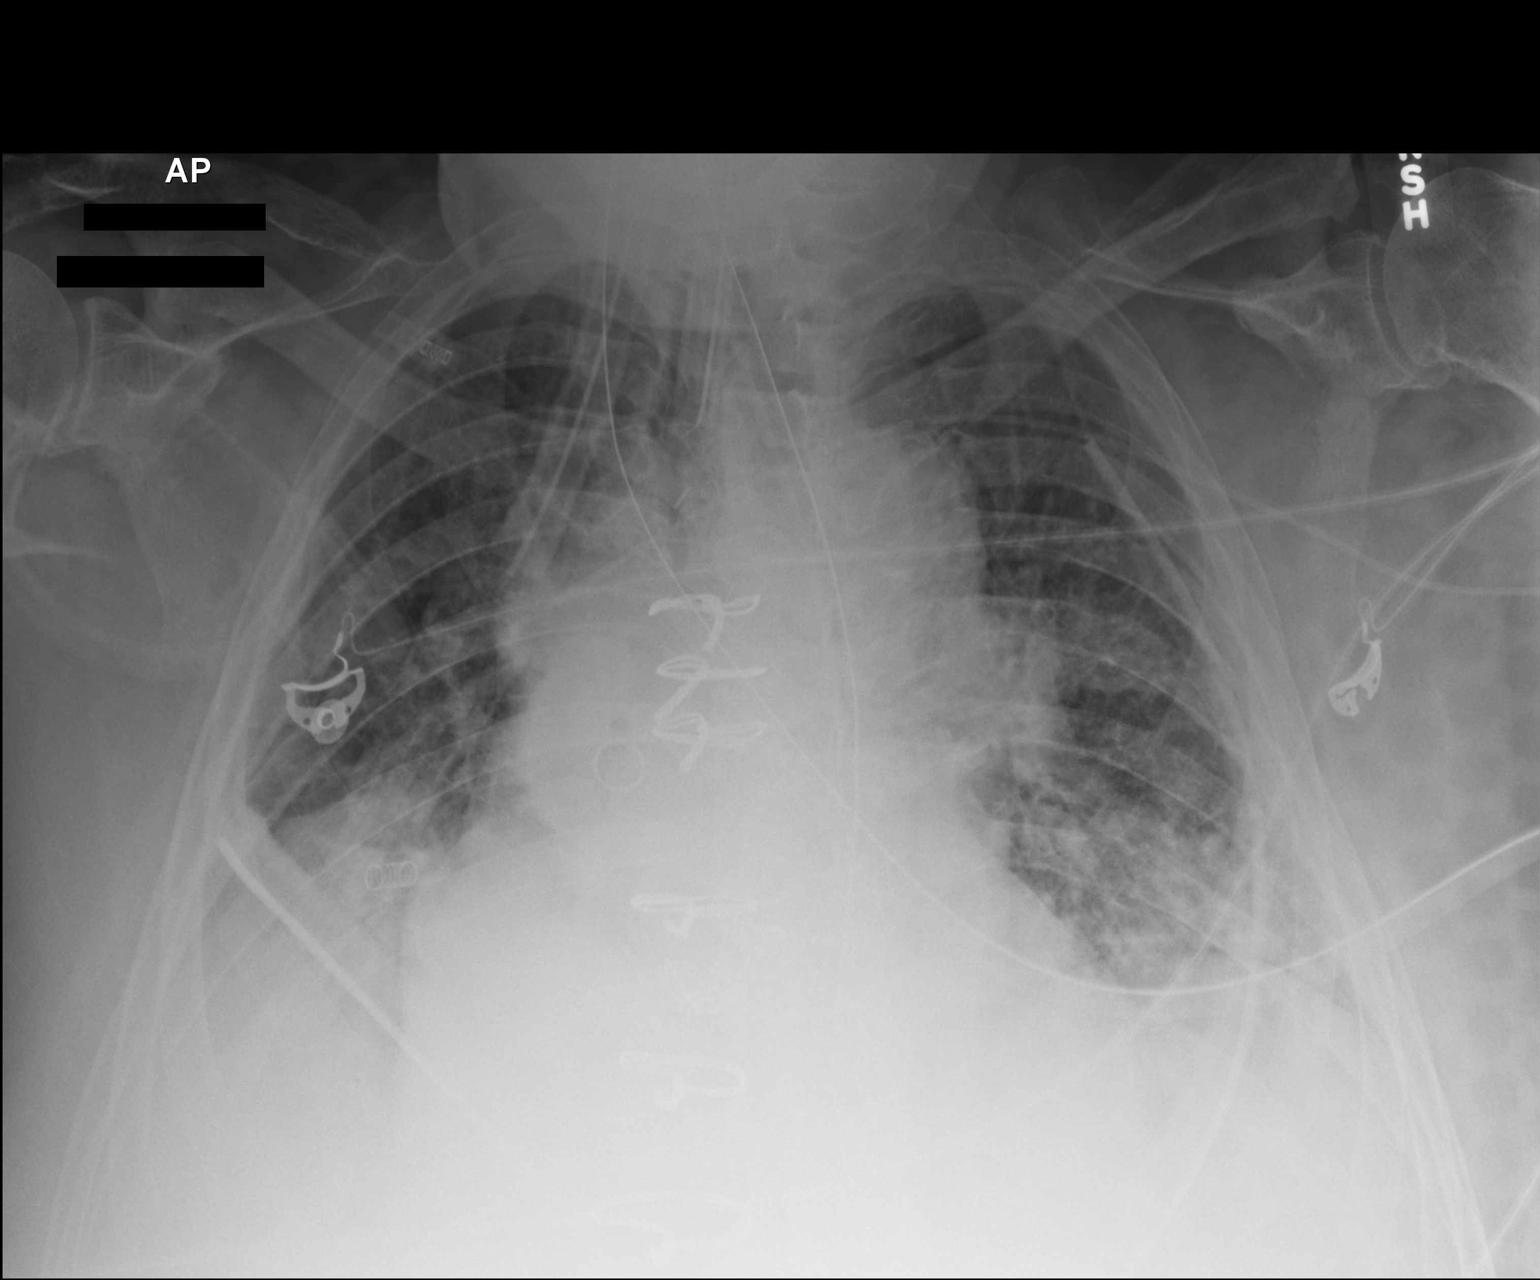

[1 of 1 positions shown; findings below may reference images not displayed]

FINDINGS: Endotracheal tube and NG tube noted in good anatomic position. Prior
CABG. Stable cardiomegaly. Developing left lower lobe pulmonary
infiltrate. Low lung volumes with bibasilar atelectasis.
Infiltrates/edema.
IMPRESSION: 1. Lines and tubes including left chest tube in stable position. No
pneumothorax.

2. Prior CABG.  Stable cardiomegaly.

3.  Developing left lower lobe infiltrate.  Low lung volumes.

## 2018-09-24 IMAGING — CR DG CHEST 1V PORT
1 series · 1 of 1 positions shown · non-contrast
Comparison: 11/05/2016

CLINICAL DATA: Shortness of breath, hila

EXAM:
PORTABLE CHEST 1 VIEW

[AP]
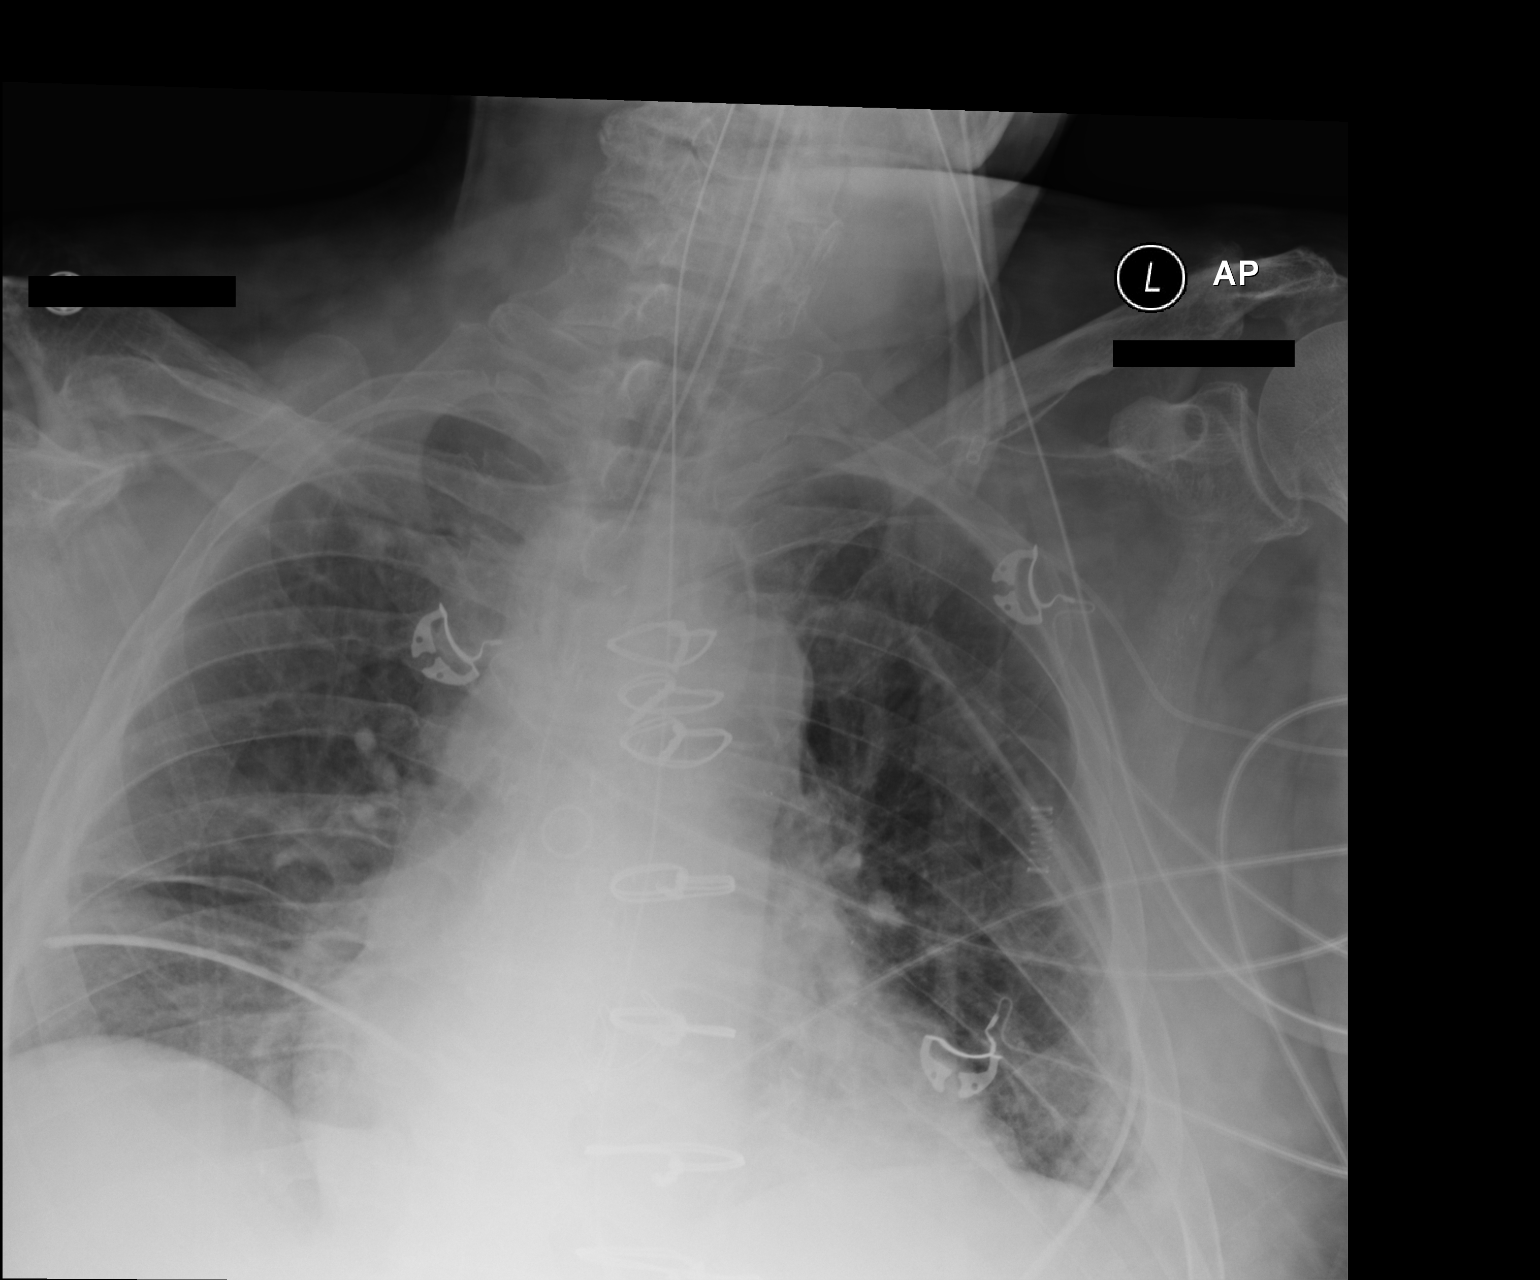

[1 of 1 positions shown; findings below may reference images not displayed]

FINDINGS: Support devices are unchanged. Bilateral chest tubes remain in
place. No visible pneumothorax. Cardiomegaly with vascular
congestion and bibasilar atelectasis, improving since prior study.
IMPRESSION: Improving bibasilar atelectasis.

Cardiomegaly, vascular congestion.

No pneumothorax.

## 2018-09-27 IMAGING — DX DG CHEST 1V PORT
1 series · 1 of 1 positions shown · non-contrast
Comparison: 11/09/2016

CLINICAL DATA: Tracheostomy tube, status post CABG

EXAM:
PORTABLE CHEST 1 VIEW

[chest ap]
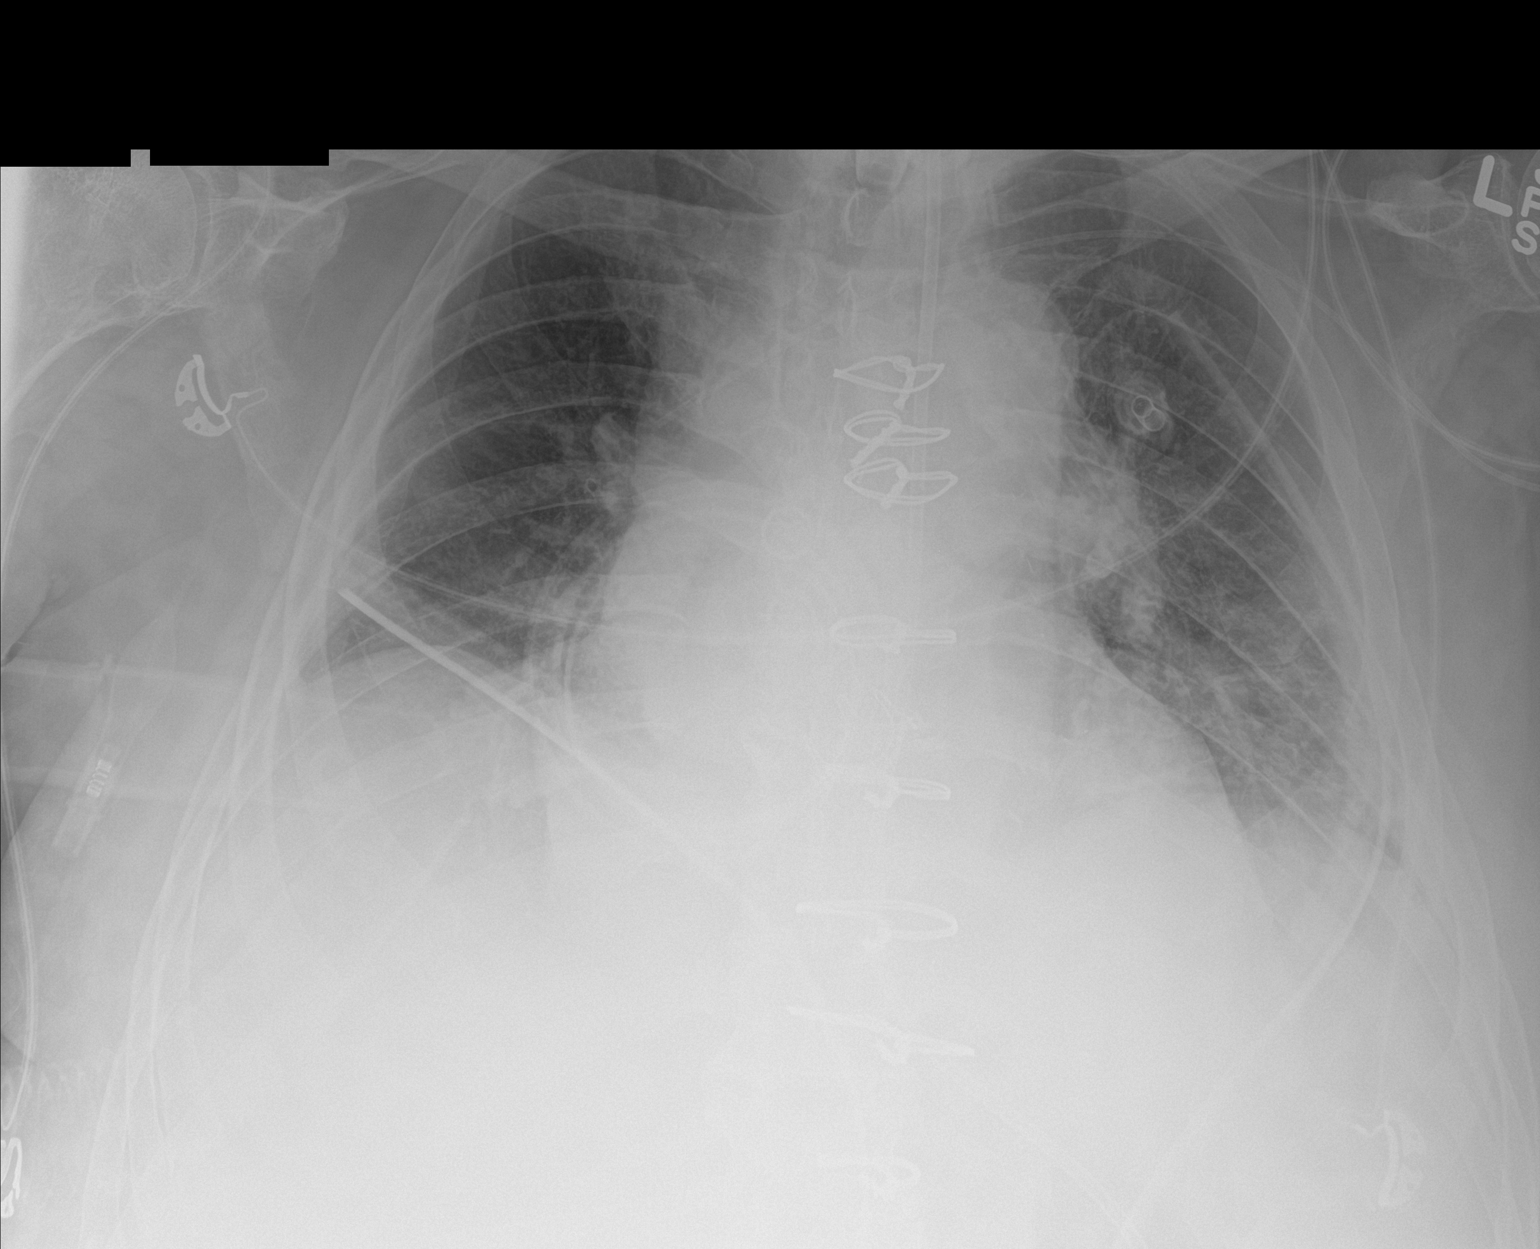

[1 of 1 positions shown; findings below may reference images not displayed]

FINDINGS: Tracheostomy tube in place. Status post median sternotomy. No
pulmonary edema. Improvement in aeration from prior exam. Streaky
mild left basilar atelectasis or infiltrate. NG tube in place.
Bilateral chest tube unchanged in position.
IMPRESSION: Improvement in aeration. Streaky mild left basilar atelectasis or
infiltrate. Tracheostomy tube in place. No pulmonary edema.

## 2018-10-24 DIAGNOSIS — E1140 Type 2 diabetes mellitus with diabetic neuropathy, unspecified: Secondary | ICD-10-CM | POA: Diagnosis not present

## 2018-10-24 DIAGNOSIS — Z0001 Encounter for general adult medical examination with abnormal findings: Secondary | ICD-10-CM | POA: Diagnosis not present

## 2018-10-24 DIAGNOSIS — Z6831 Body mass index (BMI) 31.0-31.9, adult: Secondary | ICD-10-CM | POA: Diagnosis not present

## 2018-10-24 DIAGNOSIS — I10 Essential (primary) hypertension: Secondary | ICD-10-CM | POA: Diagnosis not present

## 2018-10-25 ENCOUNTER — Ambulatory Visit (INDEPENDENT_AMBULATORY_CARE_PROVIDER_SITE_OTHER): Payer: Medicare Other | Admitting: *Deleted

## 2018-10-25 DIAGNOSIS — Q231 Congenital insufficiency of aortic valve: Secondary | ICD-10-CM | POA: Diagnosis not present

## 2018-10-25 DIAGNOSIS — I9789 Other postprocedural complications and disorders of the circulatory system, not elsewhere classified: Secondary | ICD-10-CM

## 2018-10-25 DIAGNOSIS — Z5181 Encounter for therapeutic drug level monitoring: Secondary | ICD-10-CM

## 2018-10-25 DIAGNOSIS — Z9889 Other specified postprocedural states: Secondary | ICD-10-CM

## 2018-10-25 DIAGNOSIS — I82603 Acute embolism and thrombosis of unspecified veins of upper extremity, bilateral: Secondary | ICD-10-CM | POA: Diagnosis not present

## 2018-10-25 DIAGNOSIS — I4891 Unspecified atrial fibrillation: Secondary | ICD-10-CM

## 2018-10-25 LAB — POCT INR: INR: 2.2 (ref 2.0–3.0)

## 2018-10-25 NOTE — Patient Instructions (Signed)
Continue coumadin 2 tablets daily.  Recheck in 6 weeks.  

## 2018-11-15 IMAGING — CR DG CHEST 1V PORT
1 series · 1 of 1 positions shown · non-contrast
Comparison: 12/17/2016

CLINICAL DATA: Pneumonia

EXAM:
PORTABLE CHEST 1 VIEW

[portable]
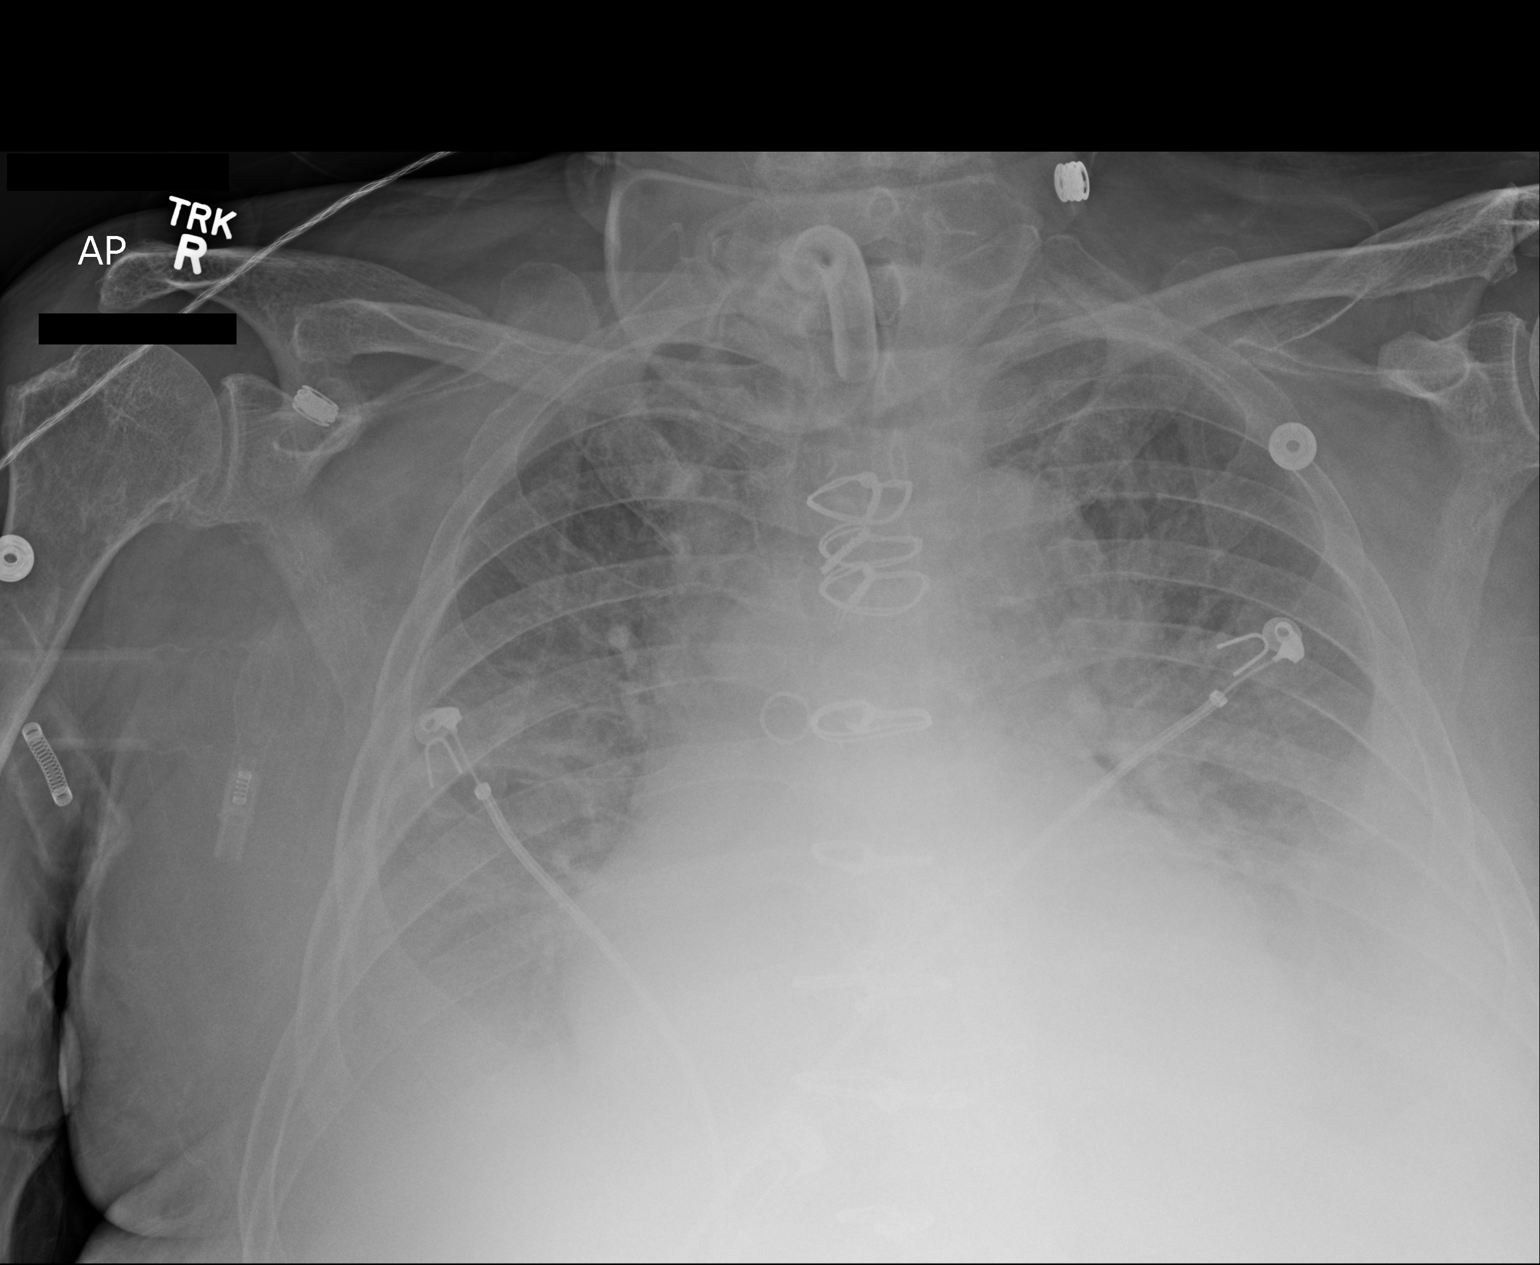

[1 of 1 positions shown; findings below may reference images not displayed]

FINDINGS: Prior CABG. Tracheostomy tube is unchanged. There is cardiomegaly
with diffuse bilateral airspace disease and layering effusions
compatible with CHF. Findings stable for slightly worsened since
prior study.
IMPRESSION: Moderate CHF and layering effusions, slightly worsened since prior
study.

## 2018-11-21 DIAGNOSIS — I10 Essential (primary) hypertension: Secondary | ICD-10-CM | POA: Diagnosis not present

## 2018-11-21 DIAGNOSIS — E1140 Type 2 diabetes mellitus with diabetic neuropathy, unspecified: Secondary | ICD-10-CM | POA: Diagnosis not present

## 2018-11-21 DIAGNOSIS — E782 Mixed hyperlipidemia: Secondary | ICD-10-CM | POA: Diagnosis not present

## 2018-11-22 IMAGING — DX DG CHEST 1V PORT
1 series · 1 of 1 positions shown · non-contrast
Comparison: 12/29/2016.

CLINICAL DATA: Respiratory failure.

EXAM:
PORTABLE CHEST 1 VIEW

[chest ap]
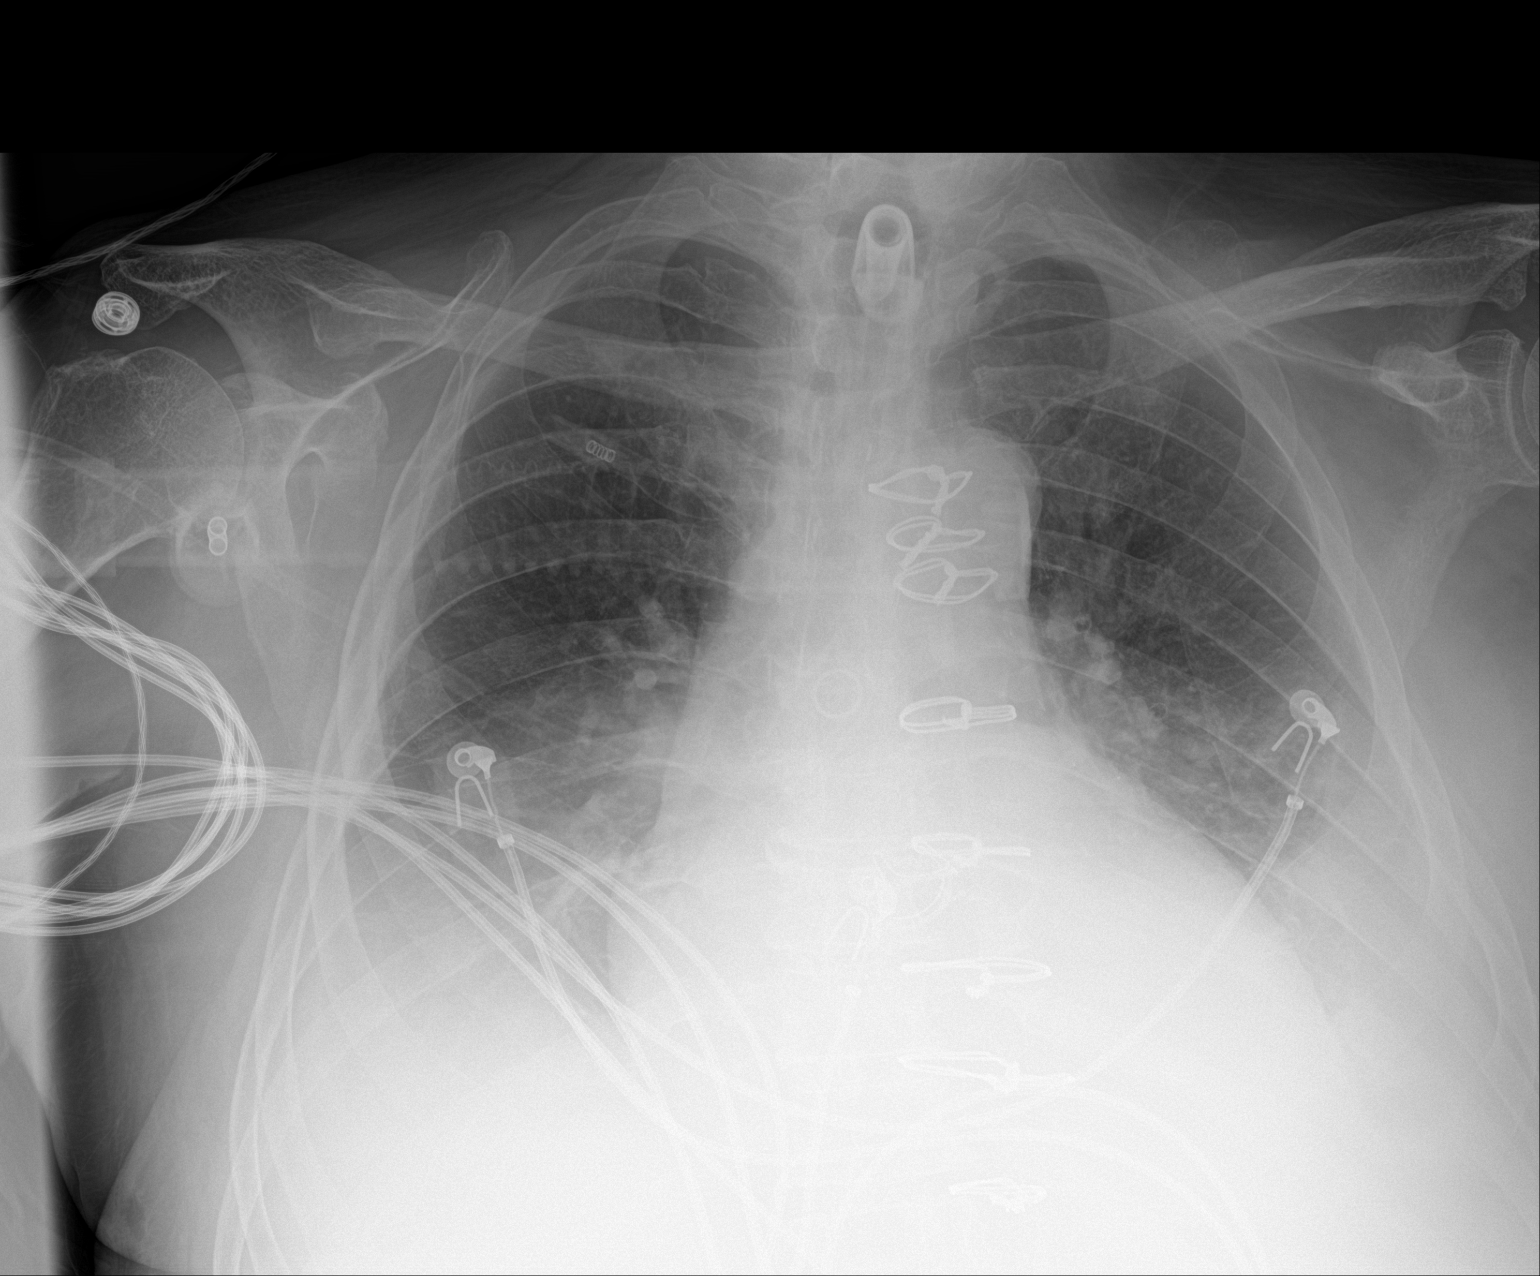

[1 of 1 positions shown; findings below may reference images not displayed]

FINDINGS: Tracheostomy tube in stable position. Prior CABG and cardiac valve
replacement. Cardiomegaly with pulmonary vascular prominence.
Bilateral pulmonary infiltrates and pleural effusions. Findings
consistent CHF. Interim improvement from prior exam . Bilateral
pneumonia cannot be excluded. No pneumothorax.
IMPRESSION: 1. Tracheostomy tube in stable position. 2. Persistent changes of
congestive heart failure bilateral pulmonary edema and bilateral
pleural effusions. Interim improvement from prior exam .

## 2018-12-06 ENCOUNTER — Ambulatory Visit (INDEPENDENT_AMBULATORY_CARE_PROVIDER_SITE_OTHER): Payer: Medicare Other | Admitting: Pharmacist

## 2018-12-06 DIAGNOSIS — Z9889 Other specified postprocedural states: Secondary | ICD-10-CM | POA: Diagnosis not present

## 2018-12-06 DIAGNOSIS — Q231 Congenital insufficiency of aortic valve: Secondary | ICD-10-CM

## 2018-12-06 DIAGNOSIS — I9789 Other postprocedural complications and disorders of the circulatory system, not elsewhere classified: Secondary | ICD-10-CM | POA: Diagnosis not present

## 2018-12-06 DIAGNOSIS — I82603 Acute embolism and thrombosis of unspecified veins of upper extremity, bilateral: Secondary | ICD-10-CM | POA: Diagnosis not present

## 2018-12-06 DIAGNOSIS — I4891 Unspecified atrial fibrillation: Secondary | ICD-10-CM | POA: Diagnosis not present

## 2018-12-06 LAB — POCT INR: INR: 2.2 (ref 2.0–3.0)

## 2018-12-06 NOTE — Patient Instructions (Signed)
Description   Continue coumadin 2 tablets daily Recheck in 6 weeks.

## 2018-12-22 DIAGNOSIS — I10 Essential (primary) hypertension: Secondary | ICD-10-CM | POA: Diagnosis not present

## 2018-12-22 DIAGNOSIS — E1140 Type 2 diabetes mellitus with diabetic neuropathy, unspecified: Secondary | ICD-10-CM | POA: Diagnosis not present

## 2018-12-22 DIAGNOSIS — E782 Mixed hyperlipidemia: Secondary | ICD-10-CM | POA: Diagnosis not present

## 2018-12-22 DIAGNOSIS — E1142 Type 2 diabetes mellitus with diabetic polyneuropathy: Secondary | ICD-10-CM | POA: Diagnosis not present

## 2019-01-13 ENCOUNTER — Other Ambulatory Visit: Payer: Self-pay | Admitting: Cardiology

## 2019-01-13 ENCOUNTER — Other Ambulatory Visit: Payer: Self-pay | Admitting: *Deleted

## 2019-01-13 MED ORDER — WARFARIN SODIUM 2 MG PO TABS: ORAL_TABLET | ORAL | 3 refills | Status: DC

## 2019-01-13 MED ORDER — DIGOXIN 125 MCG PO TABS: 0.1250 mg | ORAL_TABLET | Freq: Every day | ORAL | 3 refills | Status: DC

## 2019-01-13 MED ORDER — DIGOXIN 125 MCG PO TABS: 0.1250 mg | ORAL_TABLET | Freq: Every day | ORAL | 0 refills | Status: DC

## 2019-01-13 NOTE — Telephone Encounter (Signed)
[facility]

## 2019-01-13 NOTE — Telephone Encounter (Signed)
[facility] received for warfarin refill. Refill sent.

## 2019-01-13 NOTE — Telephone Encounter (Signed)
°*  STAT* If patient is at the pharmacy, call can be transferred to refill team.   1. Which medications need to be refilled?digoxin (LANOXIN) 0.125 MG tablet     2. Which pharmacy/location (including street and city if local pharmacy) is medication to be sent to? [facility]   [phone]  Also [facility] [place]  3. Do they need a 30 day or 90 day supply?       NEEDS ALL CARDIAC SCRIPTS SENT TO THE [facility]

## 2019-01-17 ENCOUNTER — Other Ambulatory Visit: Payer: Self-pay | Admitting: Cardiology

## 2019-01-17 ENCOUNTER — Ambulatory Visit (INDEPENDENT_AMBULATORY_CARE_PROVIDER_SITE_OTHER): Payer: Medicare Other | Admitting: *Deleted

## 2019-01-17 DIAGNOSIS — Q231 Congenital insufficiency of aortic valve: Secondary | ICD-10-CM | POA: Diagnosis not present

## 2019-01-17 DIAGNOSIS — I82603 Acute embolism and thrombosis of unspecified veins of upper extremity, bilateral: Secondary | ICD-10-CM | POA: Diagnosis not present

## 2019-01-17 DIAGNOSIS — I9789 Other postprocedural complications and disorders of the circulatory system, not elsewhere classified: Secondary | ICD-10-CM | POA: Diagnosis not present

## 2019-01-17 DIAGNOSIS — Z5181 Encounter for therapeutic drug level monitoring: Secondary | ICD-10-CM | POA: Diagnosis not present

## 2019-01-17 DIAGNOSIS — I4891 Unspecified atrial fibrillation: Secondary | ICD-10-CM | POA: Diagnosis not present

## 2019-01-17 DIAGNOSIS — Z9889 Other specified postprocedural states: Secondary | ICD-10-CM | POA: Diagnosis not present

## 2019-01-17 LAB — POCT INR: INR: 1.6 — AB (ref 2.0–3.0)

## 2019-01-17 MED ORDER — DIGOXIN 125 MCG PO TABS: 0.1250 mg | ORAL_TABLET | Freq: Every day | ORAL | 1 refills | Status: DC

## 2019-01-17 MED ORDER — POTASSIUM CHLORIDE CRYS ER 20 MEQ PO TBCR: 20.0000 meq | EXTENDED_RELEASE_TABLET | Freq: Two times a day (BID) | ORAL | 1 refills | Status: DC

## 2019-01-17 MED ORDER — WARFARIN SODIUM 2 MG PO TABS: ORAL_TABLET | ORAL | 3 refills | Status: DC

## 2019-01-17 MED ORDER — ATENOLOL 25 MG PO TABS: 25.0000 mg | ORAL_TABLET | Freq: Every day | ORAL | 1 refills | Status: DC

## 2019-01-17 MED ORDER — ATORVASTATIN CALCIUM 40 MG PO TABS: 40.0000 mg | ORAL_TABLET | Freq: Every day | ORAL | 1 refills | Status: DC

## 2019-01-17 MED ORDER — LISINOPRIL 2.5 MG PO TABS: 2.5000 mg | ORAL_TABLET | Freq: Every day | ORAL | 1 refills | Status: DC

## 2019-01-17 MED ORDER — FUROSEMIDE 40 MG PO TABS: 40.0000 mg | ORAL_TABLET | Freq: Every day | ORAL | 1 refills | Status: DC

## 2019-01-17 NOTE — Telephone Encounter (Signed)
Please send all medications to [facility] pharmacy [phone].    furosemide (LASIX) 40 MG tablet   warfarin (COUMADIN) 2 MG tablet  digoxin (LANOXIN) 0.125 MG tablet   KLOR-CON M20 20 MEQ tablet   lisinopril (PRINIVIL,ZESTRIL) 2.5 MG tablet   atenolol (TENORMIN) 25 MG tablet   atorvastatin (LIPITOR) 40 MG tablet

## 2019-01-17 NOTE — Patient Instructions (Signed)
Take coumadin 3 tablets x 3 days then resume 2 tablets daily Recheck in 3 weeks.

## 2019-01-17 NOTE — Telephone Encounter (Signed)
Medication sent to [place] [facility] [place]

## 2019-02-06 ENCOUNTER — Ambulatory Visit: Payer: Medicare Other | Admitting: Cardiology

## 2019-02-07 ENCOUNTER — Other Ambulatory Visit: Payer: Self-pay

## 2019-02-07 ENCOUNTER — Ambulatory Visit (INDEPENDENT_AMBULATORY_CARE_PROVIDER_SITE_OTHER): Payer: Medicare Other | Admitting: *Deleted

## 2019-02-07 DIAGNOSIS — Z9889 Other specified postprocedural states: Secondary | ICD-10-CM | POA: Diagnosis not present

## 2019-02-07 DIAGNOSIS — Q231 Congenital insufficiency of aortic valve: Secondary | ICD-10-CM | POA: Diagnosis not present

## 2019-02-07 DIAGNOSIS — I9789 Other postprocedural complications and disorders of the circulatory system, not elsewhere classified: Secondary | ICD-10-CM | POA: Diagnosis not present

## 2019-02-07 DIAGNOSIS — I4891 Unspecified atrial fibrillation: Secondary | ICD-10-CM

## 2019-02-07 DIAGNOSIS — Z5181 Encounter for therapeutic drug level monitoring: Secondary | ICD-10-CM

## 2019-02-07 DIAGNOSIS — I82603 Acute embolism and thrombosis of unspecified veins of upper extremity, bilateral: Secondary | ICD-10-CM | POA: Diagnosis not present

## 2019-02-07 LAB — POCT INR: INR: 2 (ref 2.0–3.0)

## 2019-02-07 NOTE — Patient Instructions (Signed)
Increase coumadin to 4mg  daily except 6mg  on Tuesdays Recheck in 4 weeks.

## 2019-02-14 ENCOUNTER — Telehealth (INDEPENDENT_AMBULATORY_CARE_PROVIDER_SITE_OTHER): Payer: Self-pay | Admitting: Orthopedic Surgery

## 2019-02-14 NOTE — Telephone Encounter (Signed)
Patient called and needs to get new sockets for his prosthetics.  He wanted to know if Dr. Sharol Given would send a prescription without the patient having to come into the office since he is a high risk patient.  CB#[phone].  Thank you.

## 2019-02-15 NOTE — Telephone Encounter (Signed)
Patient was called and Rx sent to [facility] for replacement sockets; Bil BKA

## 2019-02-20 DIAGNOSIS — K210 Gastro-esophageal reflux disease with esophagitis: Secondary | ICD-10-CM | POA: Diagnosis not present

## 2019-02-20 DIAGNOSIS — E1159 Type 2 diabetes mellitus with other circulatory complications: Secondary | ICD-10-CM | POA: Diagnosis not present

## 2019-02-20 DIAGNOSIS — E7800 Pure hypercholesterolemia, unspecified: Secondary | ICD-10-CM | POA: Diagnosis not present

## 2019-02-20 DIAGNOSIS — E1121 Type 2 diabetes mellitus with diabetic nephropathy: Secondary | ICD-10-CM | POA: Diagnosis not present

## 2019-02-20 DIAGNOSIS — E782 Mixed hyperlipidemia: Secondary | ICD-10-CM | POA: Diagnosis not present

## 2019-02-20 DIAGNOSIS — E781 Pure hyperglyceridemia: Secondary | ICD-10-CM | POA: Diagnosis not present

## 2019-02-21 DIAGNOSIS — Z6830 Body mass index (BMI) 30.0-30.9, adult: Secondary | ICD-10-CM | POA: Diagnosis not present

## 2019-02-21 DIAGNOSIS — E1140 Type 2 diabetes mellitus with diabetic neuropathy, unspecified: Secondary | ICD-10-CM | POA: Diagnosis not present

## 2019-02-21 DIAGNOSIS — N401 Enlarged prostate with lower urinary tract symptoms: Secondary | ICD-10-CM | POA: Diagnosis not present

## 2019-02-21 DIAGNOSIS — Z23 Encounter for immunization: Secondary | ICD-10-CM | POA: Diagnosis not present

## 2019-02-21 DIAGNOSIS — I10 Essential (primary) hypertension: Secondary | ICD-10-CM | POA: Diagnosis not present

## 2019-02-21 DIAGNOSIS — M1A9XX0 Chronic gout, unspecified, without tophus (tophi): Secondary | ICD-10-CM | POA: Diagnosis not present

## 2019-02-21 DIAGNOSIS — E782 Mixed hyperlipidemia: Secondary | ICD-10-CM | POA: Diagnosis not present

## 2019-02-21 DIAGNOSIS — G2581 Restless legs syndrome: Secondary | ICD-10-CM | POA: Diagnosis not present

## 2019-03-04 ENCOUNTER — Other Ambulatory Visit: Payer: Self-pay | Admitting: Cardiology

## 2019-03-06 ENCOUNTER — Telehealth: Payer: Self-pay | Admitting: *Deleted

## 2019-03-06 NOTE — Telephone Encounter (Signed)
° °  COVID-19 Pre-Screening Questions: ° °• Do you currently have a fever?NO ° ° °• Have you recently travelled on a cruise, internationally, or to [place], [place], [place], [place], [place], or [place], [place] ([employer]) ? NO °•  °• Have you been in contact with someone that is currently pending confirmation of Covid19 testing or has been confirmed to have the Covid19 virus?  NO °•  °Are you currently experiencing fatigue or cough? NO ° ° °   ° ° ° ° °

## 2019-03-07 ENCOUNTER — Other Ambulatory Visit: Payer: Self-pay

## 2019-03-07 ENCOUNTER — Ambulatory Visit (INDEPENDENT_AMBULATORY_CARE_PROVIDER_SITE_OTHER): Payer: Medicare Other | Admitting: *Deleted

## 2019-03-07 DIAGNOSIS — I4891 Unspecified atrial fibrillation: Secondary | ICD-10-CM | POA: Diagnosis not present

## 2019-03-07 DIAGNOSIS — Z9889 Other specified postprocedural states: Secondary | ICD-10-CM | POA: Diagnosis not present

## 2019-03-07 DIAGNOSIS — Z5181 Encounter for therapeutic drug level monitoring: Secondary | ICD-10-CM

## 2019-03-07 DIAGNOSIS — Q231 Congenital insufficiency of aortic valve: Secondary | ICD-10-CM | POA: Diagnosis not present

## 2019-03-07 DIAGNOSIS — I9789 Other postprocedural complications and disorders of the circulatory system, not elsewhere classified: Secondary | ICD-10-CM | POA: Diagnosis not present

## 2019-03-07 DIAGNOSIS — I82603 Acute embolism and thrombosis of unspecified veins of upper extremity, bilateral: Secondary | ICD-10-CM

## 2019-03-07 LAB — POCT INR: INR: 1.9 — AB (ref 2.0–3.0)

## 2019-03-07 NOTE — Patient Instructions (Signed)
Increase coumadin to 4mg  daily except 6mg  on Tuesdays and Fridays Recheck in 5 weeks at Dr Harl Bowie appt.

## 2019-03-22 ENCOUNTER — Telehealth (INDEPENDENT_AMBULATORY_CARE_PROVIDER_SITE_OTHER): Payer: Self-pay

## 2019-03-22 NOTE — Telephone Encounter (Signed)
Called and lm on vm for pt that he needs to call the office and make an appt. Last in office 10/18/17 and needs updated office eval for replacement socket from [facility].

## 2019-03-23 ENCOUNTER — Telehealth (INDEPENDENT_AMBULATORY_CARE_PROVIDER_SITE_OTHER): Payer: Self-pay | Admitting: Orthopedic Surgery

## 2019-03-23 DIAGNOSIS — I10 Essential (primary) hypertension: Secondary | ICD-10-CM | POA: Diagnosis not present

## 2019-03-23 DIAGNOSIS — E782 Mixed hyperlipidemia: Secondary | ICD-10-CM | POA: Diagnosis not present

## 2019-03-23 NOTE — Telephone Encounter (Signed)
Can you please call the pt and make an appt  For next week. I had called pt yesterday and documented in chart that he needs office visit so we can update chart and send notes to [facility].

## 2019-03-23 NOTE — Telephone Encounter (Signed)
Pt was unsure if he needed to make an apt or not but he needs new sockets for his prosthetic leg.

## 2019-04-03 ENCOUNTER — Telehealth: Payer: Self-pay

## 2019-04-03 NOTE — Telephone Encounter (Signed)
-----   Message from Pamella Pert, [place] sent at 04/03/2019  8:05 AM EDT ----- Regarding: appt This pt needs an appt for prosthetic eval. I called on 03/22/19 and lm on vm to make an appt and have not heard back for pt. He had appt with [facility] and advised that office that he has not been successful in reaching our office to make this appt. I will call pt today and try and make appt. Will hold on to this message until I am able to reach him.

## 2019-04-03 NOTE — Telephone Encounter (Signed)
I called and sw pt and made an appt for this Tuesday.

## 2019-04-04 ENCOUNTER — Other Ambulatory Visit: Payer: Self-pay

## 2019-04-04 ENCOUNTER — Encounter: Payer: Self-pay | Admitting: Orthopedic Surgery

## 2019-04-04 ENCOUNTER — Ambulatory Visit (INDEPENDENT_AMBULATORY_CARE_PROVIDER_SITE_OTHER): Payer: Medicare Other | Admitting: Physician Assistant

## 2019-04-04 VITALS — Ht 69.0 in | Wt 208.8 lb

## 2019-04-04 DIAGNOSIS — Z89511 Acquired absence of right leg below knee: Secondary | ICD-10-CM | POA: Diagnosis not present

## 2019-04-04 DIAGNOSIS — Z89112 Acquired absence of left hand: Secondary | ICD-10-CM

## 2019-04-04 DIAGNOSIS — Z89512 Acquired absence of left leg below knee: Secondary | ICD-10-CM | POA: Diagnosis not present

## 2019-04-04 DIAGNOSIS — Z89111 Acquired absence of right hand: Secondary | ICD-10-CM | POA: Diagnosis not present

## 2019-04-04 DIAGNOSIS — S68411S Complete traumatic amputation of right hand at wrist level, sequela: Secondary | ICD-10-CM

## 2019-04-04 NOTE — Progress Notes (Signed)
Office Visit Note   Patient: Scott [name]           Date of Birth: January 28, 1946           MRN: [id number] Visit Date: 04/04/2019              Requested by: Sasser, Silvestre Moment, MD [place], [place] [id number] PCP: Manon Hilding, MD  Chief Complaint  Patient presents with  . Left Leg - Follow-up  . Right Leg - Follow-up      HPI: The patient is a 73 year old gentleman who is seen for bilateral prosthetic evaluations.  The patient is status post bilateral transtibial amputations as well as complete amputation of the left hand and complete amputation of the fingers and thumb of the right hand. He has vacuum sockets for his transtibial amputation and reports continued volume loss of the residual limbs bilaterally.  He reports that he is intermittently had difficulty with skin breakdown but none currently.  He does feel like he is getting some increased pressure over the distal tibia area bilaterally. He does wear a orthotic for functional assistance over his right residual hand and reports some coolness over the distal metacarpals and occasionally some mild discomfort.  He has excellent radial pulse here.  We discussed that he could potentially utilize some nitroglycerin patch over the right hand to improve flow to this area but he like to hold off on this for now.  Assessment & Plan: Visit Diagnoses:  1. S/P BKA (below knee amputation) bilateral ([place])   2. Amputation of both hands with complication, sequela ([place])     Plan: The patient was given a prescription for K3 bilateral transtibial prosthetics.  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  He needs to be able to go up and down curbs, stairs, bleachers and needs to be able to ambulate on gravel, grass and other uneven surfaces.  Follow-Up Instructions: Return in about 1 year (around 04/03/2020).   Ortho Exam  Patient is alert, oriented, no adenopathy, well-dressed, normal  affect, normal respiratory effort. Bilateral transtibial amputation sites are well consolidated.  He has volume loss bilaterally and is having difficulty fitting despite multiple socks.  He has no skin breakdown currently but some minimal pressure areas over the distal tibia.  No signs of cellulitis or infection.  Full knee extension and excellent flexion. Bilateral hand amputation sites are well-healed.  He does have some coolness to palpation over the metacarpals of the right hand but excellent radial pulse and no signs of cellulitis or infection.  Imaging: No results found. No images are attached to the encounter.  Labs: Lab Results  Component Value Date   HGBA1C 6.6 (H) 10/21/2016   REPTSTATUS 02/06/2017 FINAL 02/01/2017   GRAMSTAIN  02/01/2017    ABUNDANT WBC PRESENT, PREDOMINANTLY PMN MODERATE GRAM NEGATIVE RODS    CULT  02/01/2017    FEW PSEUDOMONAS AERUGINOSA MODERATE BACTEROIDES FRAGILIS BETA LACTAMASE POSITIVE    LABORGA PSEUDOMONAS AERUGINOSA 02/01/2017     Lab Results  Component Value Date   ALBUMIN 3.2 (L) 03/11/2017   ALBUMIN 2.0 (L) 02/05/2017   ALBUMIN 2.3 (L) 12/10/2016   PREALBUMIN 10.8 (L) 11/16/2016   PREALBUMIN 19.7 10/21/2016    Body mass index is 30.83 kg/m.  Orders:  No orders of the defined types were placed in this encounter.  No orders of the defined types were placed in this encounter.  Procedures: No procedures performed  Clinical Data: No additional findings.  ROS:  All other systems negative, except as noted in the HPI. Review of Systems  Objective: Vital Signs: Ht 5\' 9"  (1.753 m)   Wt 208 lb 12.8 oz (94.7 kg)   BMI 30.83 kg/m   Specialty Comments:  No specialty comments available.  PMFS History: Patient Active Problem List   Diagnosis Date Noted  . Amputation of both hands with complication, subsequent encounter 03/01/2017  . Diabetes ([place]) 02/19/2017  . Status post bilateral below knee amputation ([place]) 02/09/2017   . Wound disruption, post-op, skin, sequela 02/09/2017  . Debility 02/05/2017  . Ganglion upper arm, right   . Thrombosis of both upper extremities   . Suspected heparin induced thrombocytopenia (HIT) in hospitalized patient   . Gangrene of lower extremity ([place])   . Heparin induced thrombocytopenia (HIT) ([place])   . Tracheostomy status ([place])   . Chest tube in place   . Atherosclerosis of native arteries of extremities with gangrene, left leg ([place])   . Atherosclerosis of native arteries of extremities with gangrene, right leg ([place])   . Acute on chronic diastolic CHF (congestive heart failure) ([place])   . Enteritis due to Clostridium difficile   . Anasarca   . FUO (fever of unknown origin)   . Acute encephalopathy   . Elevated LFTs   . DIC (disseminated intravascular coagulation) ([place]) 10/28/2016  . Postoperative atrial fibrillation ([place]) 10/24/2016  . Cardiogenic shock ([place])   . Mitral regurgitation due to cusp prolapse 10/22/2016  . S/P aortic valve replacement with bioprosthetic valve 10/22/2016  . S/P ascending aortic aneurysm repair 10/22/2016  . S/P mitral valve repair 10/22/2016  . S/P CABG x 3 10/22/2016  . Thrombocytopenia ([place]) 10/22/2016  . Acute combined systolic and diastolic congestive heart failure ([place]) 10/22/2016  . Acute respiratory failure ([place]) 10/22/2016  . Thoracic ascending aortic aneurysm ([place]) 10/21/2016  . Coronary artery disease involving native coronary artery of native heart with unstable angina pectoris ([place]) 10/20/2016  . Severe aortic stenosis by prior echocardiography 10/19/2016  . Unstable angina ([place]) 10/19/2016  . Aortic valve stenosis 10/19/2016  . Bicuspid aortic valve 10/19/2016  . Trigger ring finger of right hand   . Wears glasses   . Gout   . Hypertension   . Carpal tunnel syndrome of right wrist   . Arthritis   . Snores    Past Medical History:  Diagnosis Date  . Arthritis    "hips, shoulders; knees; back" (10/20/2016)  . Bicuspid  aortic valve   . BPH (benign prostatic hypertrophy)   . Carpal tunnel syndrome of right wrist   . Coronary artery disease involving native coronary artery of native heart with unstable angina pectoris ([place]) 10/20/2016  . Diverticulitis   . GERD (gastroesophageal reflux disease)   . Gout   . Heart murmur   . Hyperlipemia   . Hypertension   . Postoperative atrial fibrillation ([place]) 10/24/2016  . RLS (restless legs syndrome)   . S/P aortic valve replacement with bioprosthetic valve 10/22/2016   25 mm [facility] Ease bovine pericardial bioprosthetic tissue valve  . S/P ascending aortic aneurysm repair 10/22/2016   28 mm supracoronary straight graft replacement of ascending thoracic aortic aneurysm  . S/P CABG x 3 10/22/2016   Sequential LIMA to Diag and LAD, SVG to distal LAD, open vein harvest right thigh  . S/P mitral valve repair 10/22/2016   Artificial Gore-tex neochord placement x6 - posterior annuloplasty band placed  but removed due to systolic anterior motion of mitral valve  . Severe aortic stenosis   . Snores    Never been tested for sleep apnea  . Thoracic ascending aortic aneurysm ([place]) 10/21/2016  . Thrombocytopenia ([place]) 10/22/2016  . Type II diabetes mellitus ([facility])     Family History  Problem Relation Age of Onset  . Lung cancer Mother   . Clotting disorder Father        No details  . Heart disease Sister 38       Stents  . Cancer Sister        Throat    Past Surgical History:  Procedure Laterality Date  . AMPUTATION Bilateral 12/11/2016   Procedure: AMPUTATION BELOW KNEE BILATERALLY;  Surgeon: [name], MD;  Location: [facility];  Service: Orthopedics;  Laterality: Bilateral;  . AMPUTATION Bilateral 12/11/2016   Procedure: AMPUTATION BILATERAL HANDS EXCEPT RIGHT THUMB;  Surgeon: [name], MD;  Location: [facility];  Service: Orthopedics;  Laterality: Bilateral;  . AORTIC VALVE REPLACEMENT N/A 10/22/2016   Procedure: AORTIC VALVE REPLACEMENT (AVR) WITH SIZE 25 MM  MAGNA EASE PERICARDIAL BIOPROSTHESIS - AORTIC;  Surgeon: Rexene Alberts, MD;  Location: [facility];  Service: Open Heart Surgery;  Laterality: N/A;  . BONE EXCISION Right 02/01/2017   Procedure: EXCISION RIGHT INDEX METACARPAL HEAD;  Surgeon: [name], MD;  Location: [facility];  Service: Orthopedics;  Laterality: Right;  . CARDIAC CATHETERIZATION N/A 10/20/2016   Procedure: Right/Left Heart Cath and Coronary Angiography;  Surgeon: Leonie Man, MD;  Location: [facility];  Service: Cardiovascular;  Laterality: N/A;  . CARPAL TUNNEL RELEASE Right 11/28/2013   Procedure: RIGHT WRIST CARPAL TUNNEL RELEASE;  Surgeon: Lorn Junes, MD;  Location: [facility];  Service: Orthopedics;  Laterality: Right;  . CATARACT EXTRACTION W/ INTRAOCULAR LENS  IMPLANT, BILATERAL Bilateral 1978  . COLONOSCOPY    . CORONARY ARTERY BYPASS GRAFT N/A 10/22/2016   Procedure: CORONARY ARTERY BYPASS GRAFTING (CABG)x 2 WITH LIMA TO DIAGONAL, OPEN  HARVESTING OF RIGHT SAPHENOUS VEIN FOR VEIN GRAFT TO LAD;  Surgeon: Rexene Alberts, MD;  Location: [facility];  Service: Open Heart Surgery;  Laterality: N/A;  . FRACTURE SURGERY    . INGUINAL HERNIA REPAIR Right 1998  . IR GENERIC HISTORICAL  12/07/2016   IR US GUIDE VASC ACCESS RIGHT 12/07/2016 Aletta Edouard, MD [facility]  . IR GENERIC HISTORICAL  12/07/2016   IR RADIOLOGY PERIPHERAL GUIDED IV START 12/07/2016 Aletta Edouard, MD [facility]  . IR GENERIC HISTORICAL  12/07/2016   IR GASTROSTOMY TUBE MOD SED 12/07/2016 Aletta Edouard, MD [facility]  . LIPOMA EXCISION Right 2008   "side of my head"  . MITRAL VALVE REPAIR N/A 10/22/2016   Procedure: MITRAL VALVE REPAIR (MVR) WITH SIZE 30 SORIN ANNULOFLEX ANNULOPLASTY RING WITH SUBSEQUENT REMOVAL OF RING;  Surgeon: Rexene Alberts, MD;  Location: [facility];  Service: Open Heart Surgery;  Laterality: N/A;  . PENECTOMY  2007   Peyronie's disease   . PENILE PROSTHESIS IMPLANT  2009  . REMOVAL OF PENILE PROSTHESIS  N/A 02/01/2017   Procedure: REMOVAL OF PENILE PROSTHESIS;  Surgeon: Kathie Rhodes, MD;  Location: [facility];  Service: Urology;  Laterality: N/A;  . SHOULDER OPEN ROTATOR CUFF REPAIR Right 2006  . STERNAL CLOSURE N/A 10/26/2016   Procedure: STERNAL WASHOUT AND DELAYED PRIMARY CLOSURE;  Surgeon: Rexene Alberts, MD;  Location: [facility];  Service: Thoracic;  Laterality: N/A;  .  TEE WITHOUT CARDIOVERSION N/A 10/26/2016   Procedure: TRANSESOPHAGEAL ECHOCARDIOGRAM (TEE);  Surgeon: Rexene Alberts, MD;  Location: [facility];  Service: Thoracic;  Laterality: N/A;  . TEE WITHOUT CARDIOVERSION N/A 10/22/2016   Procedure: TRANSESOPHAGEAL ECHOCARDIOGRAM (TEE);  Surgeon: Rexene Alberts, MD;  Location: [facility];  Service: Open Heart Surgery;  Laterality: N/A;  . THORACIC AORTIC ANEURYSM REPAIR  10/22/2016   Procedure: ASCENDING AORTIC  ANEURYSM REPAIR (AAA) WITH 28 MM HEMASHIELD PLATINUM WOVEN DOUBLE VELOUR VASCULAR GRAFT;  Surgeon: Rexene Alberts, MD;  Location: [facility];  Service: Open Heart Surgery;;  . TONSILLECTOMY  ~ 1955  . TRACHEOSTOMY TUBE PLACEMENT N/A 11/09/2016   Procedure: TRACHEOSTOMY;  Surgeon: Rexene Alberts, MD;  Location: [facility];  Service: Thoracic;  Laterality: N/A;  . TRANSURETHRAL RESECTION OF PROSTATE  2005  . TRIGGER FINGER RELEASE Bilateral    several lt and rt hands  . TRIGGER FINGER RELEASE Right 11/28/2013   Procedure: RIGHT TRIGGER FINGER  RELEASE (TENDON SHEATH INCISION);  Surgeon: Lorn Junes, MD;  Location: [facility];  Service: Orthopedics;  Laterality: Right;  Marland Kitchen VIDEO BRONCHOSCOPY N/A 11/09/2016   Procedure: VIDEO BRONCHOSCOPY;  Surgeon: Rexene Alberts, MD;  Location: [facility];  Service: Thoracic;  Laterality: N/A;  . WRIST FRACTURE SURGERY Right ~ 1959   Social History   Occupational History  . Occupation: Designer, multimedia: [employer]    Comment: retired  Tobacco Use  . Smoking status: Former Smoker    Years: 3.00    Types: Pipe, Cigars    Last attempt  to quit: 1984    Years since quitting: 36.3  . Smokeless tobacco: Never Used  Substance and Sexual Activity  . Alcohol use: Yes    Alcohol/week: 10.0 standard drinks    Types: 10 Cans of beer per week  . Drug use: No  . Sexual activity: Not Currently

## 2019-04-05 ENCOUNTER — Encounter: Payer: Self-pay | Admitting: Physician Assistant

## 2019-04-07 ENCOUNTER — Other Ambulatory Visit: Payer: Self-pay | Admitting: Cardiology

## 2019-04-10 ENCOUNTER — Telehealth: Payer: Self-pay | Admitting: *Deleted

## 2019-04-10 NOTE — Telephone Encounter (Signed)
The patient verbally consented for a telehealth phone visit with [facility] and understands that his/her insurance company will be billed for the encounter.  He will have vitals, weight, & medications ready.

## 2019-04-11 ENCOUNTER — Encounter: Payer: Self-pay | Admitting: *Deleted

## 2019-04-11 ENCOUNTER — Ambulatory Visit (INDEPENDENT_AMBULATORY_CARE_PROVIDER_SITE_OTHER): Payer: Medicare Other | Admitting: *Deleted

## 2019-04-11 ENCOUNTER — Telehealth (INDEPENDENT_AMBULATORY_CARE_PROVIDER_SITE_OTHER): Payer: Medicare Other | Admitting: Cardiology

## 2019-04-11 ENCOUNTER — Encounter: Payer: Self-pay | Admitting: Cardiology

## 2019-04-11 VITALS — BP 133/82 | Ht 69.0 in | Wt 205.0 lb

## 2019-04-11 DIAGNOSIS — I9789 Other postprocedural complications and disorders of the circulatory system, not elsewhere classified: Secondary | ICD-10-CM | POA: Diagnosis not present

## 2019-04-11 DIAGNOSIS — Z9889 Other specified postprocedural states: Secondary | ICD-10-CM | POA: Diagnosis not present

## 2019-04-11 DIAGNOSIS — Z953 Presence of xenogenic heart valve: Secondary | ICD-10-CM | POA: Diagnosis not present

## 2019-04-11 DIAGNOSIS — I82603 Acute embolism and thrombosis of unspecified veins of upper extremity, bilateral: Secondary | ICD-10-CM

## 2019-04-11 DIAGNOSIS — I2510 Atherosclerotic heart disease of native coronary artery without angina pectoris: Secondary | ICD-10-CM

## 2019-04-11 DIAGNOSIS — Z5181 Encounter for therapeutic drug level monitoring: Secondary | ICD-10-CM

## 2019-04-11 DIAGNOSIS — I4891 Unspecified atrial fibrillation: Secondary | ICD-10-CM

## 2019-04-11 DIAGNOSIS — Q231 Congenital insufficiency of aortic valve: Secondary | ICD-10-CM

## 2019-04-11 LAB — POCT INR: INR: 3.2 — AB (ref 2.0–3.0)

## 2019-04-11 MED ORDER — METOPROLOL SUCCINATE ER 25 MG PO TB24: 25.0000 mg | ORAL_TABLET | Freq: Every day | ORAL | 1 refills | Status: DC

## 2019-04-11 NOTE — Patient Instructions (Signed)
Take coumadin 1 tablet tonight then resume 4mg  daily except 6mg  on Tuesdays and Fridays Increase Vit K foods Recheck in 6 weeks

## 2019-04-11 NOTE — Patient Instructions (Signed)
Your physician recommends that you schedule a follow-up appointment in: [place] has recommended you make the following change in your medication:   STOP ATENOLOL   STOP DIGOXIN   START TOPROL XL 25 MG DAILY   Thank you for choosing [facility]!!

## 2019-04-11 NOTE — Addendum Note (Signed)
Addended by: Julian Hy T on: 04/11/2019 04:37 PM   Modules accepted: Orders

## 2019-04-11 NOTE — Progress Notes (Signed)
Virtual Visit via Telephone Note   This visit type was conducted due to national recommendations for restrictions regarding the COVID-19 Pandemic (e.g. social distancing) in an effort to limit this patient's exposure and mitigate transmission in our community.  Due to his co-morbid illnesses, this patient is at least at moderate risk for complications without adequate follow up.  This format is felt to be most appropriate for this patient at this time.  The patient did not have access to video technology/had technical difficulties with video requiring transitioning to audio format only (telephone).  All issues noted in this document were discussed and addressed.  No physical exam could be performed with this format.  Please refer to the patient's chart for his  consent to telehealth for [facility].   Date:  04/11/2019   ID:  Scott [name], DOB 11/14/1946, MRN [id number]  Patient Location: Home Provider Location: Home  PCP:  Manon Hilding, MD  Cardiologist:  Carlyle Dolly, MD  Electrophysiologist:  None   Evaluation Performed:  Follow-Up Visit  Chief Complaint:  6 month follow up  History of Present Illness:    Scott [name] is a 73 y.o. male seen today for follow up of the following medical problems.   1. Aortic stenosis - history of bicuspid AV - history of prior AVR, [employer] Magna Ease Pericardial Tissue Valve (size 36mm, model # 3300TFX, serial # [id number]) along with 3 vessel CABG, MV repair, and repair of ascending aortic aneurysm   - OR on 10/22/16 for CABG X 3, AVR, MVR, repair of thoracic ascending aneurysm with placement of VAC on open chest Post op course significant for cardiogenic shock requiring multiple pressors, SAM requiring removal of mitral annuloplasty ring, persistent fevers, encephalopathy, shocked liver, DIC due to consumptive intraoperative coagulopathy, diffuse clotting with ischemia of distal BUE/BLE extremities, A fib, C diff colitis, volume  overload and VDRF with difficulty with vent wean requiring tracheostomy. Sternal wound closed on 12/4 and was started on coumadin for RV thrombus and A fib.   [facility] course significant for MRSA bacteremia, pneumonitis, intermittent fevers, dysphagia requiring PEG tube placement on 1/15 by Dr. Kathlene Cote. He developed progressive gangrenous changes of BUE and BLE requiring B-transtibial amputation , left transmetacarpal amputation and Right MCP amputation of right hand by Dr. Sharol Given on 1/19.      - no recent SOB or DOE.  - no recent edema - no bleeding issues on coumadin      2. Mitral regurgitation - patient had MV ring placed during his surgery 10/22/17 along with AVR, ascending thoracic aneurysm repair, and CABG.  - ring was subsequently removed during that operation due to significant SAM - 02/15/17 echo shows only mild MR  -denies any symptoms  3. CAD - history of CABG 10/22/2017 (LIMA-Diag and distal LAD, SVG-LAD) 01/2017 echo: 55-60%, no WMAs, grade II diastolic dysfunction. Normal functioning AV.    - denies any chest pain  4. Ischemic limbs - occurred in setting of cardiogenic shock requiring high dose pressors as well as DIC - required amputation - followed by ortho.  5.Afib/aflutter - initally had post op afib  - noted to be in aflutter at clinical f/u and thus was committed to long term anticoagulation    - has had some recent palpitations - occurs about 1-2 times per week, lasts about 30 seconds.         The patient does not have symptoms concerning for COVID-19 infection (fever, chills, cough, or new shortness  of breath).    Past Medical History:  Diagnosis Date  . Arthritis    "hips, shoulders; knees; back" (10/20/2016)  . Bicuspid aortic valve   . BPH (benign prostatic hypertrophy)   . Carpal tunnel syndrome of right wrist   . Coronary artery disease involving native coronary artery of native heart with unstable angina pectoris  ([place]) 10/20/2016  . Diverticulitis   . GERD (gastroesophageal reflux disease)   . Gout   . Heart murmur   . Hyperlipemia   . Hypertension   . Postoperative atrial fibrillation ([place]) 10/24/2016  . RLS (restless legs syndrome)   . S/P aortic valve replacement with bioprosthetic valve 10/22/2016   25 mm [facility] Ease bovine pericardial bioprosthetic tissue valve  . S/P ascending aortic aneurysm repair 10/22/2016   28 mm supracoronary straight graft replacement of ascending thoracic aortic aneurysm  . S/P CABG x 3 10/22/2016   Sequential LIMA to Diag and LAD, SVG to distal LAD, open vein harvest right thigh  . S/P mitral valve repair 10/22/2016   Artificial Gore-tex neochord placement x6 - posterior annuloplasty band placed but removed due to systolic anterior motion of mitral valve  . Severe aortic stenosis   . Snores    Never been tested for sleep apnea  . Thoracic ascending aortic aneurysm ([place]) 10/21/2016  . Thrombocytopenia ([place]) 10/22/2016  . Type II diabetes mellitus ([place])    Past Surgical History:  Procedure Laterality Date  . AMPUTATION Bilateral 12/11/2016   Procedure: AMPUTATION BELOW KNEE BILATERALLY;  Surgeon: [name], MD;  Location: [place];  Service: Orthopedics;  Laterality: Bilateral;  . AMPUTATION Bilateral 12/11/2016   Procedure: AMPUTATION BILATERAL HANDS EXCEPT RIGHT THUMB;  Surgeon: [name], MD;  Location: [place];  Service: Orthopedics;  Laterality: Bilateral;  . AORTIC VALVE REPLACEMENT N/A 10/22/2016   Procedure: AORTIC VALVE REPLACEMENT (AVR) WITH SIZE 25 MM MAGNA EASE PERICARDIAL BIOPROSTHESIS - AORTIC;  Surgeon: Rexene Alberts, MD;  Location: [place];  Service: Open Heart Surgery;  Laterality: N/A;  . BONE EXCISION Right 02/01/2017   Procedure: EXCISION RIGHT INDEX METACARPAL HEAD;  Surgeon: [name], MD;  Location: [place];  Service: Orthopedics;  Laterality: Right;  . CARDIAC CATHETERIZATION N/A 10/20/2016   Procedure: Right/Left Heart Cath and  Coronary Angiography;  Surgeon: Leonie Man, MD;  Location: [place] CV LAB;  Service: Cardiovascular;  Laterality: N/A;  . CARPAL TUNNEL RELEASE Right 11/28/2013   Procedure: RIGHT WRIST CARPAL TUNNEL RELEASE;  Surgeon: Lorn Junes, MD;  Location: [place];  Service: Orthopedics;  Laterality: Right;  . CATARACT EXTRACTION W/ INTRAOCULAR LENS  IMPLANT, BILATERAL Bilateral 1978  . COLONOSCOPY    . CORONARY ARTERY BYPASS GRAFT N/A 10/22/2016   Procedure: CORONARY ARTERY BYPASS GRAFTING (CABG)x 2 WITH LIMA TO DIAGONAL, OPEN  HARVESTING OF RIGHT SAPHENOUS VEIN FOR VEIN GRAFT TO LAD;  Surgeon: Rexene Alberts, MD;  Location: [place];  Service: Open Heart Surgery;  Laterality: N/A;  . FRACTURE SURGERY    . INGUINAL HERNIA REPAIR Right 1998  . IR GENERIC HISTORICAL  12/07/2016   IR US GUIDE VASC ACCESS RIGHT 12/07/2016 Aletta Edouard, MD [facility]  . IR GENERIC HISTORICAL  12/07/2016   IR RADIOLOGY PERIPHERAL GUIDED IV START 12/07/2016 Aletta Edouard, MD [facility]  . IR GENERIC HISTORICAL  12/07/2016   IR GASTROSTOMY TUBE MOD SED 12/07/2016 Aletta Edouard, MD [facility]  . LIPOMA EXCISION Right 2008   "side of my  head"  . MITRAL VALVE REPAIR N/A 10/22/2016   Procedure: MITRAL VALVE REPAIR (MVR) WITH SIZE 30 SORIN ANNULOFLEX ANNULOPLASTY RING WITH SUBSEQUENT REMOVAL OF RING;  Surgeon: Rexene Alberts, MD;  Location: [place];  Service: Open Heart Surgery;  Laterality: N/A;  . PENECTOMY  2007   Peyronie's disease   . PENILE PROSTHESIS IMPLANT  2009  . REMOVAL OF PENILE PROSTHESIS N/A 02/01/2017   Procedure: REMOVAL OF PENILE PROSTHESIS;  Surgeon: Kathie Rhodes, MD;  Location: [place];  Service: Urology;  Laterality: N/A;  . SHOULDER OPEN ROTATOR CUFF REPAIR Right 2006  . STERNAL CLOSURE N/A 10/26/2016   Procedure: STERNAL WASHOUT AND DELAYED PRIMARY CLOSURE;  Surgeon: Rexene Alberts, MD;  Location: [place];  Service: Thoracic;  Laterality: N/A;  . TEE WITHOUT CARDIOVERSION N/A  10/26/2016   Procedure: TRANSESOPHAGEAL ECHOCARDIOGRAM (TEE);  Surgeon: Rexene Alberts, MD;  Location: [place];  Service: Thoracic;  Laterality: N/A;  . TEE WITHOUT CARDIOVERSION N/A 10/22/2016   Procedure: TRANSESOPHAGEAL ECHOCARDIOGRAM (TEE);  Surgeon: Rexene Alberts, MD;  Location: [place];  Service: Open Heart Surgery;  Laterality: N/A;  . THORACIC AORTIC ANEURYSM REPAIR  10/22/2016   Procedure: ASCENDING AORTIC  ANEURYSM REPAIR (AAA) WITH 28 MM HEMASHIELD PLATINUM WOVEN DOUBLE VELOUR VASCULAR GRAFT;  Surgeon: Rexene Alberts, MD;  Location: [place];  Service: Open Heart Surgery;;  . TONSILLECTOMY  ~ 1955  . TRACHEOSTOMY TUBE PLACEMENT N/A 11/09/2016   Procedure: TRACHEOSTOMY;  Surgeon: Rexene Alberts, MD;  Location: [place];  Service: Thoracic;  Laterality: N/A;  . TRANSURETHRAL RESECTION OF PROSTATE  2005  . TRIGGER FINGER RELEASE Bilateral    several lt and rt hands  . TRIGGER FINGER RELEASE Right 11/28/2013   Procedure: RIGHT TRIGGER FINGER  RELEASE (TENDON SHEATH INCISION);  Surgeon: Lorn Junes, MD;  Location: [place];  Service: Orthopedics;  Laterality: Right;  Marland Kitchen VIDEO BRONCHOSCOPY N/A 11/09/2016   Procedure: VIDEO BRONCHOSCOPY;  Surgeon: Rexene Alberts, MD;  Location: [place];  Service: Thoracic;  Laterality: N/A;  . WRIST FRACTURE SURGERY Right ~ 1959     No outpatient medications have been marked as taking for the 04/11/19 encounter (Appointment) with Arnoldo Lenis, MD.     Allergies:   Sulfa antibiotics   Social History   Tobacco Use  . Smoking status: Former Smoker    Years: 3.00    Types: Pipe, Cigars    Last attempt to quit: 1984    Years since quitting: 36.4  . Smokeless tobacco: Never Used  Substance Use Topics  . Alcohol use: Yes    Alcohol/week: 10.0 standard drinks    Types: 10 Cans of beer per week  . Drug use: No     Family Hx: The patient's family history includes Cancer in his sister; Clotting disorder in his father; Heart disease  (age of onset: 71) in his sister; Lung cancer in his mother.  ROS:   Please see the history of present illness.     All other systems reviewed and are negative.   Prior CV studies:   The following studies were reviewed today: na  Labs/Other Tests and Data Reviewed:    EKG:  na  Recent Labs: No results found for requested labs within last 8760 hours.   Recent Lipid Panel Lab Results  Component Value Date/Time   CHOL 134 10/21/2016 02:17 AM   TRIG 203 (H) 10/21/2016 02:17 AM   HDL 28 (L) 10/21/2016 02:17 AM  CHOLHDL 4.8 10/21/2016 02:17 AM   LDLCALC 65 10/21/2016 02:17 AM    Wt Readings from Last 3 Encounters:  04/04/19 208 lb 12.8 oz (94.7 kg)  07/27/18 208 lb 12.8 oz (94.7 kg)  01/11/18 204 lb 12.8 oz (92.9 kg)     Objective:    Vital Signs:   Today's Vitals   04/11/19 1558  BP: 133/82  Weight: 205 lb (93 kg)  Height: 5\' 9"  (1.753 m)   Body mass index is 30.27 kg/m.  Normal affect. Normal speech pattern and tone. Comfortable, no apparent distress. No audible signs of SOb or wheezing.      ASSESSMENT & PLAN:    1. Aortic stenosis - s/p tissue AVR, most recent echo shows normal function.  - no symptoms, continue to monitor.   2. CAD - s/p CABG.  - recent symptoms, continue current meds   3.Afib -some recent palpitatoins. He had been on atenolol and digoxin prior to me seeing him and had done well so we continued - with recent symptoms, stop digoxin and atenolol, start toprol 25mg  daily    Request labs from pcp  F/u 4 months  COVID-19 Education: The signs and symptoms of COVID-19 were discussed with the patient and how to seek care for testing (follow up with PCP or arrange E-visit).  The importance of social distancing was discussed today.  Time:   Today, I have spent 16 minutes with the patient with telehealth technology discussing the above problems.     Medication Adjustments/Labs and Tests Ordered: Current medicines are  reviewed at length with the patient today.  Concerns regarding medicines are outlined above.   Tests Ordered: No orders of the defined types were placed in this encounter.   Medication Changes: No orders of the defined types were placed in this encounter.   Disposition:  Follow up 4 months  Signed, Carlyle Dolly, MD  04/11/2019 3:55 PM    [place]

## 2019-05-18 ENCOUNTER — Telehealth: Payer: Self-pay | Admitting: *Deleted

## 2019-05-18 NOTE — Telephone Encounter (Signed)
Patient notified and verbalized understanding. 

## 2019-05-18 NOTE — Telephone Encounter (Signed)
No longer needs refill   Needs to get more information on metrprolol

## 2019-05-18 NOTE — Telephone Encounter (Signed)
Need emergency refill on warfarin

## 2019-05-18 NOTE — Telephone Encounter (Signed)
Uncommon and only potential very mild increase in blood sugars. The medication has far more benefits for his heart that risks to his blood sugars, I would recommend starting   J Cyniah Gossard DM

## 2019-05-18 NOTE — Telephone Encounter (Signed)
Patient states that he is diabetic & does not check his sugar that often due to having only a thumb to prick.  Says he read the insert on the Toprol XL and it mentions that it could cause elevation in blood sugar.  He has not started medication yet as he was concerned about this.  States his A1C runs 7-8 & sees pmd for management of his diabetes every 4 months.

## 2019-05-23 ENCOUNTER — Other Ambulatory Visit: Payer: Self-pay

## 2019-05-23 ENCOUNTER — Ambulatory Visit (INDEPENDENT_AMBULATORY_CARE_PROVIDER_SITE_OTHER): Payer: Medicare Other | Admitting: *Deleted

## 2019-05-23 DIAGNOSIS — Z953 Presence of xenogenic heart valve: Secondary | ICD-10-CM

## 2019-05-23 DIAGNOSIS — E1165 Type 2 diabetes mellitus with hyperglycemia: Secondary | ICD-10-CM | POA: Diagnosis not present

## 2019-05-23 DIAGNOSIS — Z9889 Other specified postprocedural states: Secondary | ICD-10-CM | POA: Diagnosis not present

## 2019-05-23 DIAGNOSIS — I9789 Other postprocedural complications and disorders of the circulatory system, not elsewhere classified: Secondary | ICD-10-CM

## 2019-05-23 DIAGNOSIS — E785 Hyperlipidemia, unspecified: Secondary | ICD-10-CM | POA: Diagnosis not present

## 2019-05-23 DIAGNOSIS — I4891 Unspecified atrial fibrillation: Secondary | ICD-10-CM | POA: Diagnosis not present

## 2019-05-23 DIAGNOSIS — Z5181 Encounter for therapeutic drug level monitoring: Secondary | ICD-10-CM | POA: Diagnosis not present

## 2019-05-23 DIAGNOSIS — I10 Essential (primary) hypertension: Secondary | ICD-10-CM | POA: Diagnosis not present

## 2019-05-23 LAB — POCT INR: INR: 3.9 — AB (ref 2.0–3.0)

## 2019-05-23 MED ORDER — WARFARIN SODIUM 2 MG PO TABS: ORAL_TABLET | ORAL | 3 refills | Status: DC

## 2019-05-23 NOTE — Patient Instructions (Signed)
Hold coumadin tonight then decrease dose to 4mg  daily except 6mg  on Tuesdays  Increase Vit K foods Recheck in 6 weeks

## 2019-06-02 DIAGNOSIS — H5203 Hypermetropia, bilateral: Secondary | ICD-10-CM | POA: Diagnosis not present

## 2019-06-02 DIAGNOSIS — H43811 Vitreous degeneration, right eye: Secondary | ICD-10-CM | POA: Diagnosis not present

## 2019-06-02 DIAGNOSIS — H524 Presbyopia: Secondary | ICD-10-CM | POA: Diagnosis not present

## 2019-06-02 DIAGNOSIS — E119 Type 2 diabetes mellitus without complications: Secondary | ICD-10-CM | POA: Diagnosis not present

## 2019-06-02 DIAGNOSIS — Z9849 Cataract extraction status, unspecified eye: Secondary | ICD-10-CM | POA: Diagnosis not present

## 2019-06-02 DIAGNOSIS — Z7984 Long term (current) use of oral hypoglycemic drugs: Secondary | ICD-10-CM | POA: Diagnosis not present

## 2019-06-02 DIAGNOSIS — H52223 Regular astigmatism, bilateral: Secondary | ICD-10-CM | POA: Diagnosis not present

## 2019-06-02 DIAGNOSIS — Z961 Presence of intraocular lens: Secondary | ICD-10-CM | POA: Diagnosis not present

## 2019-06-19 DIAGNOSIS — E781 Pure hyperglyceridemia: Secondary | ICD-10-CM | POA: Diagnosis not present

## 2019-06-19 DIAGNOSIS — E782 Mixed hyperlipidemia: Secondary | ICD-10-CM | POA: Diagnosis not present

## 2019-06-19 DIAGNOSIS — E1159 Type 2 diabetes mellitus with other circulatory complications: Secondary | ICD-10-CM | POA: Diagnosis not present

## 2019-06-19 DIAGNOSIS — E1151 Type 2 diabetes mellitus with diabetic peripheral angiopathy without gangrene: Secondary | ICD-10-CM | POA: Diagnosis not present

## 2019-06-19 DIAGNOSIS — I10 Essential (primary) hypertension: Secondary | ICD-10-CM | POA: Diagnosis not present

## 2019-06-19 DIAGNOSIS — E7800 Pure hypercholesterolemia, unspecified: Secondary | ICD-10-CM | POA: Diagnosis not present

## 2019-06-19 DIAGNOSIS — K210 Gastro-esophageal reflux disease with esophagitis: Secondary | ICD-10-CM | POA: Diagnosis not present

## 2019-06-19 DIAGNOSIS — E1121 Type 2 diabetes mellitus with diabetic nephropathy: Secondary | ICD-10-CM | POA: Diagnosis not present

## 2019-06-19 DIAGNOSIS — E7801 Familial hypercholesterolemia: Secondary | ICD-10-CM | POA: Diagnosis not present

## 2019-06-22 DIAGNOSIS — M1A9XX0 Chronic gout, unspecified, without tophus (tophi): Secondary | ICD-10-CM | POA: Diagnosis not present

## 2019-06-22 DIAGNOSIS — N401 Enlarged prostate with lower urinary tract symptoms: Secondary | ICD-10-CM | POA: Diagnosis not present

## 2019-06-22 DIAGNOSIS — E6609 Other obesity due to excess calories: Secondary | ICD-10-CM | POA: Diagnosis not present

## 2019-06-22 DIAGNOSIS — Z6830 Body mass index (BMI) 30.0-30.9, adult: Secondary | ICD-10-CM | POA: Diagnosis not present

## 2019-06-22 DIAGNOSIS — E1140 Type 2 diabetes mellitus with diabetic neuropathy, unspecified: Secondary | ICD-10-CM | POA: Diagnosis not present

## 2019-06-22 DIAGNOSIS — I10 Essential (primary) hypertension: Secondary | ICD-10-CM | POA: Diagnosis not present

## 2019-06-22 DIAGNOSIS — E782 Mixed hyperlipidemia: Secondary | ICD-10-CM | POA: Diagnosis not present

## 2019-06-22 DIAGNOSIS — G2581 Restless legs syndrome: Secondary | ICD-10-CM | POA: Diagnosis not present

## 2019-06-23 DIAGNOSIS — E785 Hyperlipidemia, unspecified: Secondary | ICD-10-CM | POA: Diagnosis not present

## 2019-06-23 DIAGNOSIS — I10 Essential (primary) hypertension: Secondary | ICD-10-CM | POA: Diagnosis not present

## 2019-07-03 ENCOUNTER — Ambulatory Visit (INDEPENDENT_AMBULATORY_CARE_PROVIDER_SITE_OTHER): Payer: Medicare Other | Admitting: *Deleted

## 2019-07-03 ENCOUNTER — Other Ambulatory Visit: Payer: Self-pay

## 2019-07-03 DIAGNOSIS — I9789 Other postprocedural complications and disorders of the circulatory system, not elsewhere classified: Secondary | ICD-10-CM | POA: Diagnosis not present

## 2019-07-03 DIAGNOSIS — Z953 Presence of xenogenic heart valve: Secondary | ICD-10-CM

## 2019-07-03 DIAGNOSIS — Z9889 Other specified postprocedural states: Secondary | ICD-10-CM | POA: Diagnosis not present

## 2019-07-03 DIAGNOSIS — Z5181 Encounter for therapeutic drug level monitoring: Secondary | ICD-10-CM

## 2019-07-03 DIAGNOSIS — I4891 Unspecified atrial fibrillation: Secondary | ICD-10-CM

## 2019-07-03 LAB — POCT INR: INR: 2.4 (ref 2.0–3.0)

## 2019-07-03 NOTE — Patient Instructions (Signed)
Continue coumadin 4mg  daily except 6mg  on Tuesdays  Continue Vit K foods Recheck in 6 weeks

## 2019-07-06 ENCOUNTER — Other Ambulatory Visit: Payer: Self-pay | Admitting: Cardiology

## 2019-07-06 MED ORDER — METOPROLOL SUCCINATE ER 25 MG PO TB24: 25.0000 mg | ORAL_TABLET | Freq: Every day | ORAL | 1 refills | Status: DC

## 2019-07-07 ENCOUNTER — Telehealth: Payer: Self-pay | Admitting: Cardiology

## 2019-07-07 NOTE — Telephone Encounter (Signed)
Having SOB when he lays down at night and while he's up moving around. He has been having some fluid build up in his upper parts of his legs. PCP changed his fluid pill to taking 1 1/2 a day.  States no chest pain.  [phone]

## 2019-07-07 NOTE — Telephone Encounter (Signed)
To clarify he has been taking 40mg  alternating days with 40mg  twice a day? If so have him take lasix 40mg  bid Jobe Marker and update [place] Monday.    Zandra Abts MD

## 2019-07-07 NOTE — Telephone Encounter (Signed)
Patient confirmed that he is taking furosemide 40 mg daily alternating with 40 mg BID. Advised to take 40 mg BID today, tomorrow and Sunday and call office Monday with update on symptoms. Verbalized understanding of plan.

## 2019-07-07 NOTE — Telephone Encounter (Signed)
Reports SOB started 3 weeks ago. One week ago started taking and extra 40 mg tablet every other day. Weight today 207 lbs and 3 weeks ago 205 lbs. Denies dizziness or chest pain.

## 2019-07-12 ENCOUNTER — Telehealth: Payer: Self-pay | Admitting: Cardiology

## 2019-07-12 MED ORDER — TORSEMIDE 20 MG PO TABS: 20.0000 mg | ORAL_TABLET | Freq: Two times a day (BID) | ORAL | 3 refills | Status: DC

## 2019-07-12 NOTE — Telephone Encounter (Signed)
Pt says since taking lasix 40 mg bid since last week has not had any imoprovment in SOB - says he has gained around 2-3lbs since last week - denies any noticeable swelling - doesn't know what HR/BP are today.

## 2019-07-12 NOTE — Telephone Encounter (Signed)
Can he give [place] an exact weight for today. Is he still having fluid in his legs? Still SOB with laying flat?    Zandra Abts MD

## 2019-07-12 NOTE — Addendum Note (Signed)
Addended by: Julian Hy T on: 07/12/2019 11:48 AM   Modules accepted: Orders

## 2019-07-12 NOTE — Telephone Encounter (Signed)
Denies any fluid in legs, weight today is 208.9lbs - says SOB is worse while lying flat but is most all of the time

## 2019-07-12 NOTE — Telephone Encounter (Signed)
Pt called stating he's still having SOB and what Dr. Harl Bowie recommended did not help out.   Pt can be reached @ [phone]

## 2019-07-12 NOTE — Telephone Encounter (Signed)
Pt voiced understanding - torsemide 20 mg sent to [facility] - pt will update [place] on Monday

## 2019-07-12 NOTE — Telephone Encounter (Signed)
I still think the most likely cause given the weight gain and symptoms worst with laying flat (called orthopnea) suggests that he is keeping extra fluid in his body and lungs. If lasix not getting the fluid off can we change him to torsemide 20mg  bid and stop lasix. Can he update [place] again on Monday   Zandra Abts MD

## 2019-07-20 DIAGNOSIS — Z23 Encounter for immunization: Secondary | ICD-10-CM | POA: Diagnosis not present

## 2019-07-26 ENCOUNTER — Telehealth: Payer: Self-pay | Admitting: Cardiology

## 2019-07-26 NOTE — Telephone Encounter (Signed)
Called pt,. No answer, left message for pt to return call.  

## 2019-07-26 NOTE — Telephone Encounter (Signed)
Increase torsemide to 40mg  bid and update [place] again on Monday. We have an appointment in the next few weeks, will be evaluate then in more detail but everything would still suggest fluid is the ongoign issue.    Zandra Abts MD

## 2019-07-26 NOTE — Telephone Encounter (Signed)
Patient called stating that he continues to have a lot of shortness of breath.  [phone].

## 2019-07-26 NOTE — Telephone Encounter (Signed)
Returned pt call. He states that he is starting to have worsening SOB. He is not sure what his BP/HR is today as he does not check it regularly. He states his weight is 208 lbs. He states that the torsemide 20 mg - bid helped his breathing for a few days, but it has since gotten worse. He is not having any swelling, or  chest pain. He did say that it is worse when lying down. Please advise.

## 2019-07-27 NOTE — Telephone Encounter (Signed)
Pt made aware of medication increase. He will call early next week to update.

## 2019-07-27 NOTE — Telephone Encounter (Signed)
Returning call.

## 2019-08-03 ENCOUNTER — Ambulatory Visit: Payer: Medicare Other | Admitting: Cardiology

## 2019-08-03 ENCOUNTER — Telehealth: Payer: Self-pay

## 2019-08-03 NOTE — Telephone Encounter (Signed)
Pt made aware, made aware of appt.

## 2019-08-03 NOTE — Telephone Encounter (Signed)
Pt called complaining of worsening SOB. He stated his weight is 203 lbs. He is sure he is retaining fluid. He has taken his torsemide 40 mg - two times daily for past week and it is not helping. He would like to be seen in office sooner than 9/22. Please advise

## 2019-08-03 NOTE — Telephone Encounter (Signed)
Can he see me tomorrow in [place] MD

## 2019-08-04 ENCOUNTER — Encounter: Payer: Self-pay | Admitting: Cardiology

## 2019-08-04 ENCOUNTER — Ambulatory Visit (INDEPENDENT_AMBULATORY_CARE_PROVIDER_SITE_OTHER): Payer: Medicare Other | Admitting: Cardiology

## 2019-08-04 ENCOUNTER — Telehealth: Payer: Self-pay | Admitting: Cardiology

## 2019-08-04 ENCOUNTER — Other Ambulatory Visit: Payer: Self-pay

## 2019-08-04 ENCOUNTER — Encounter: Payer: Self-pay | Admitting: *Deleted

## 2019-08-04 VITALS — BP 124/82 | HR 98 | Ht 69.0 in | Wt 206.8 lb

## 2019-08-04 DIAGNOSIS — I2510 Atherosclerotic heart disease of native coronary artery without angina pectoris: Secondary | ICD-10-CM | POA: Diagnosis not present

## 2019-08-04 DIAGNOSIS — R0602 Shortness of breath: Secondary | ICD-10-CM

## 2019-08-04 DIAGNOSIS — I9789 Other postprocedural complications and disorders of the circulatory system, not elsewhere classified: Secondary | ICD-10-CM

## 2019-08-04 DIAGNOSIS — I4891 Unspecified atrial fibrillation: Secondary | ICD-10-CM

## 2019-08-04 MED ORDER — TORSEMIDE 20 MG PO TABS: 40.0000 mg | ORAL_TABLET | Freq: Two times a day (BID) | ORAL | 3 refills | Status: DC

## 2019-08-04 MED ORDER — METOPROLOL SUCCINATE ER 50 MG PO TB24: 50.0000 mg | ORAL_TABLET | Freq: Every day | ORAL | 1 refills | Status: DC

## 2019-08-04 NOTE — Patient Instructions (Addendum)
Your physician recommends that you schedule a follow-up appointment in: AS SCHEDULED 08/15/2019 WITH DR [facility]  Your physician has recommended you make the following change in your medication:   INCREASE TOPROL XL 50 MG DAILY   Your physician has requested that you have an echocardiogram. Echocardiography is a painless test that uses sound waves to create images of your heart. It provides your doctor with information about the size and shape of your heart and how well your heart's chambers and valves are working. This procedure takes approximately one hour. There are no restrictions for this procedure.  Your physician recommends that you return for lab work NEXT WEEK BNP/BMP/TSH/MG  Thank you for choosing [facility]!!

## 2019-08-04 NOTE — Telephone Encounter (Signed)
Pre-cert Verification for the following procedure    ECHO  Scheduled for 08-08-2019 at [facility]

## 2019-08-04 NOTE — Progress Notes (Signed)
Clinical Summary Scott [name] is a 73 y.o.male seen today for follow up of the following medical problems.   1. Aortic stenosis - history of bicuspid AV - history of AVR 10/22/2016, [facility] Ease Pericardial Tissue Valve (size 1mm, model # 3300TFX, serial # [id number]) along with 3 vessel CABG, MV repair, and repair of ascending aortic aneurysm   - OR on 10/22/16 for CABG X 3, AVR, MVR, repair of thoracic ascending aneurysm with placement of VAC on open chest Post op course significant for cardiogenic shock requiring multiple pressors, SAM requiring removal of mitral annuloplasty ring, persistent fevers, encephalopathy, shocked liver, DIC due to consumptive intraoperative coagulopathy, diffuse clotting with ischemia of distal BUE/BLE extremities, A fib, C diff colitis, volume overload and VDRF with difficulty with vent wean requiring tracheostomy. Sternal wound closed on 12/4 and was started on coumadin for RV thrombus and A fib.   [facility] course significant for MRSA bacteremia, pneumonitis, intermittent fevers, dysphagia requiring PEG tube placement on 1/15 by Dr. Kathlene Cote. He developed progressive gangrenous changes of BUE and BLE requiring B-transtibial amputation , left transmetacarpal amputation and Right MCP amputation of right hand by Dr. Sharol Given on 1/19.   01/2017 echo: 55-60%, no WMAs, grade II diastolic dysfunction. Normal functioning AV.     - recent SOB/DOE, fluid gain. We titrated his lasix and later changed to torsemide 20mg  bid then 40mg  bid - home weight up to 208 lbs initially, yesterday reported weight down to 203 lbs.  by our scales he was 208 lbs in 07/2018, today 205 lbs.  Toresemide was incrased 9/2  2. Mitral regurgitation - patient had MV ring placed during his surgery 10/22/17 along with AVR, ascending thoracic aneurysm repair, and CABG.  - ring was subsequently removed during that operation due to significant SAM - 02/15/17 echo shows only mild MR     3. CAD - history of CABG 10/22/2017 (LIMA-Diag and distal LAD, SVG-LAD) 01/2017 echo: 55-60%, no WMAs, grade II diastolic dysfunction. Normal functioning AV.    - he denies any recent chest pain  4. Ischemic limbs - occurred in setting of cardiogenic shock requiring high dose pressors as well as DIC - required amputation - followed by ortho.  5.Afib/aflutter - initally had post op afib  - noted to be in aflutter at clinical f/uand thus was committed to long term anticoagulation    - has had some recent palpitations - occurs about 1-2 times per week, lasts about 30 seconds.    6. SOB -started late July - tried taking some extra diuretic on his own initially with some improvement.  - recent SOB/DOE, fluid gain. We titrated his lasix and later changed to torsemide 20mg  bid then 40mg  bid - home weight up to 208 lbs initially, yesterday reported weight down to 203 lbs.  by our scales he was 208 lbs in 07/2018, today 205 lbs.   - symptoms have improved with torsemide. Some orthopnea. - highest weight 209 lbs, down to 206 lbs as low as 204.  - some cough at times, some clear sputum which is chronic. - no chest pain. Occasional palpitations - moderate improvement     Past Medical History:  Diagnosis Date  . Arthritis    "hips, shoulders; knees; back" (10/20/2016)  . Bicuspid aortic valve   . BPH (benign prostatic hypertrophy)   . Carpal tunnel syndrome of right wrist   . Coronary artery disease involving native coronary artery of native heart with unstable angina pectoris ([place]) 10/20/2016  .  Diverticulitis   . GERD (gastroesophageal reflux disease)   . Gout   . Heart murmur   . Hyperlipemia   . Hypertension   . Postoperative atrial fibrillation ([place]) 10/24/2016  . RLS (restless legs syndrome)   . S/P aortic valve replacement with bioprosthetic valve 10/22/2016   25 mm [facility] Ease bovine pericardial bioprosthetic tissue valve  . S/P ascending  aortic aneurysm repair 10/22/2016   28 mm supracoronary straight graft replacement of ascending thoracic aortic aneurysm  . S/P CABG x 3 10/22/2016   Sequential LIMA to Diag and LAD, SVG to distal LAD, open vein harvest right thigh  . S/P mitral valve repair 10/22/2016   Artificial Gore-tex neochord placement x6 - posterior annuloplasty band placed but removed due to systolic anterior motion of mitral valve  . Severe aortic stenosis   . Snores    Never been tested for sleep apnea  . Thoracic ascending aortic aneurysm ([place]) 10/21/2016  . Thrombocytopenia ([place]) 10/22/2016  . Type II diabetes mellitus ([facility])      Allergies  Allergen Reactions  . Sulfa Antibiotics Hives     Current Outpatient Medications  Medication Sig Dispense Refill  . allopurinol (ZYLOPRIM) 300 MG tablet Take 300 mg by mouth daily.    Marland Kitchen aspirin EC 81 MG tablet Take 81 mg by mouth daily.    Marland Kitchen atorvastatin (LIPITOR) 40 MG tablet TAKE 1 TABLET DAILY AT 6PM 90 tablet 1  . KLOR-CON M20 20 MEQ tablet TAKE 1 TABLET BY MOUTH TWICE A DAY 180 tablet 2  . KLOR-CON M20 20 MEQ tablet TAKE 1 TABLET TWICE A DAY 180 tablet 1  . lisinopril (ZESTRIL) 2.5 MG tablet TAKE 1 TABLET DAILY 90 tablet 1  . Melatonin 10 MG CAPS Take 10 mg by mouth at bedtime.     . metFORMIN (GLUCOPHAGE) 500 MG tablet Take 500 mg by mouth daily with breakfast. & 1000 mg by mouth in the evening    . metoprolol succinate (TOPROL XL) 25 MG 24 hr tablet Take 1 tablet (25 mg total) by mouth daily. 90 tablet 1  . nitroGLYCERIN (NITROSTAT) 0.4 MG SL tablet Place 1 tablet (0.4 mg total) under the tongue every 5 (five) minutes as needed. 25 tablet 3  . rOPINIRole (REQUIP) 2 MG tablet Take 2 mg by mouth See admin instructions. Takes 2mg  at 4:30pm and 2mg  at 9:30pm.    . tamsulosin (FLOMAX) 0.4 MG CAPS capsule Take 0.4 mg by mouth daily.    Marland Kitchen torsemide (DEMADEX) 20 MG tablet Take 1 tablet (20 mg total) by mouth 2 (two) times daily. 60 tablet 3  . warfarin (COUMADIN) 2  MG tablet TAKE 2 TABLETS DAILY EXCEPT 3 TABLETS ON TUESDAYS OR AS DIRECTED 185 tablet 3   No current facility-administered medications for this visit.      Past Surgical History:  Procedure Laterality Date  . AMPUTATION Bilateral 12/11/2016   Procedure: AMPUTATION BELOW KNEE BILATERALLY;  Surgeon: [name], MD;  Location: [place];  Service: Orthopedics;  Laterality: Bilateral;  . AMPUTATION Bilateral 12/11/2016   Procedure: AMPUTATION BILATERAL HANDS EXCEPT RIGHT THUMB;  Surgeon: [name], MD;  Location: [place];  Service: Orthopedics;  Laterality: Bilateral;  . AORTIC VALVE REPLACEMENT N/A 10/22/2016   Procedure: AORTIC VALVE REPLACEMENT (AVR) WITH SIZE 25 MM MAGNA EASE PERICARDIAL BIOPROSTHESIS - AORTIC;  Surgeon: Rexene Alberts, MD;  Location: [place];  Service: Open Heart Surgery;  Laterality: N/A;  . BONE EXCISION Right 02/01/2017  Procedure: EXCISION RIGHT INDEX METACARPAL HEAD;  Surgeon: [name], MD;  Location: [place];  Service: Orthopedics;  Laterality: Right;  . CARDIAC CATHETERIZATION N/A 10/20/2016   Procedure: Right/Left Heart Cath and Coronary Angiography;  Surgeon: Leonie Man, MD;  Location: [place] [facility];  Service: Cardiovascular;  Laterality: N/A;  . CARPAL TUNNEL RELEASE Right 11/28/2013   Procedure: RIGHT WRIST CARPAL TUNNEL RELEASE;  Surgeon: Lorn Junes, MD;  Location: [place];  Service: Orthopedics;  Laterality: Right;  . CATARACT EXTRACTION W/ INTRAOCULAR LENS  IMPLANT, BILATERAL Bilateral 1978  . COLONOSCOPY    . CORONARY ARTERY BYPASS GRAFT N/A 10/22/2016   Procedure: CORONARY ARTERY BYPASS GRAFTING (CABG)x 2 WITH LIMA TO DIAGONAL, OPEN  HARVESTING OF RIGHT SAPHENOUS VEIN FOR VEIN GRAFT TO LAD;  Surgeon: Rexene Alberts, MD;  Location: [place];  Service: Open Heart Surgery;  Laterality: N/A;  . FRACTURE SURGERY    . INGUINAL HERNIA REPAIR Right 1998  . IR GENERIC HISTORICAL  12/07/2016   IR US GUIDE VASC ACCESS RIGHT 12/07/2016  Aletta Edouard, MD [facility]  . IR GENERIC HISTORICAL  12/07/2016   IR RADIOLOGY PERIPHERAL GUIDED IV START 12/07/2016 Aletta Edouard, MD [facility]  . IR GENERIC HISTORICAL  12/07/2016   IR GASTROSTOMY TUBE MOD SED 12/07/2016 Aletta Edouard, MD [facility]  . LIPOMA EXCISION Right 2008   "side of my head"  . MITRAL VALVE REPAIR N/A 10/22/2016   Procedure: MITRAL VALVE REPAIR (MVR) WITH SIZE 30 SORIN ANNULOFLEX ANNULOPLASTY RING WITH SUBSEQUENT REMOVAL OF RING;  Surgeon: Rexene Alberts, MD;  Location: [place];  Service: Open Heart Surgery;  Laterality: N/A;  . PENECTOMY  2007   Peyronie's disease   . PENILE PROSTHESIS IMPLANT  2009  . REMOVAL OF PENILE PROSTHESIS N/A 02/01/2017   Procedure: REMOVAL OF PENILE PROSTHESIS;  Surgeon: Kathie Rhodes, MD;  Location: [place];  Service: Urology;  Laterality: N/A;  . SHOULDER OPEN ROTATOR CUFF REPAIR Right 2006  . STERNAL CLOSURE N/A 10/26/2016   Procedure: STERNAL WASHOUT AND DELAYED PRIMARY CLOSURE;  Surgeon: Rexene Alberts, MD;  Location: [place];  Service: Thoracic;  Laterality: N/A;  . TEE WITHOUT CARDIOVERSION N/A 10/26/2016   Procedure: TRANSESOPHAGEAL ECHOCARDIOGRAM (TEE);  Surgeon: Rexene Alberts, MD;  Location: [place];  Service: Thoracic;  Laterality: N/A;  . TEE WITHOUT CARDIOVERSION N/A 10/22/2016   Procedure: TRANSESOPHAGEAL ECHOCARDIOGRAM (TEE);  Surgeon: Rexene Alberts, MD;  Location: [place];  Service: Open Heart Surgery;  Laterality: N/A;  . THORACIC AORTIC ANEURYSM REPAIR  10/22/2016   Procedure: ASCENDING AORTIC  ANEURYSM REPAIR (AAA) WITH 28 MM HEMASHIELD PLATINUM WOVEN DOUBLE VELOUR VASCULAR GRAFT;  Surgeon: Rexene Alberts, MD;  Location: [place];  Service: Open Heart Surgery;;  . TONSILLECTOMY  ~ 1955  . TRACHEOSTOMY TUBE PLACEMENT N/A 11/09/2016   Procedure: TRACHEOSTOMY;  Surgeon: Rexene Alberts, MD;  Location: [place];  Service: Thoracic;  Laterality: N/A;  . TRANSURETHRAL RESECTION OF PROSTATE  2005  . TRIGGER FINGER RELEASE  Bilateral    several lt and rt hands  . TRIGGER FINGER RELEASE Right 11/28/2013   Procedure: RIGHT TRIGGER FINGER  RELEASE (TENDON SHEATH INCISION);  Surgeon: Lorn Junes, MD;  Location: [place];  Service: Orthopedics;  Laterality: Right;  Marland Kitchen VIDEO BRONCHOSCOPY N/A 11/09/2016   Procedure: VIDEO BRONCHOSCOPY;  Surgeon: Rexene Alberts, MD;  Location: [place];  Service: Thoracic;  Laterality: N/A;  . WRIST FRACTURE  SURGERY Right ~ 1959     Allergies  Allergen Reactions  . Sulfa Antibiotics Hives      Family History  Problem Relation Age of Onset  . Lung cancer Mother   . Clotting disorder Father        No details  . Heart disease Sister 20       Stents  . Cancer Sister        Throat     Social History Mr. Boss reports that he quit smoking about 36 years ago. His smoking use included pipe and cigars. He quit after 3.00 years of use. He has never used smokeless tobacco. Mr. Wunschel reports current alcohol use of about 10.0 standard drinks of alcohol per week.   Review of Systems CONSTITUTIONAL: No weight loss, fever, chills, weakness or fatigue.  HEENT: Eyes: No visual loss, blurred vision, double vision or yellow sclerae.No hearing loss, sneezing, congestion, runny nose or sore throat.  SKIN: No rash or itching.  CARDIOVASCULAR: per hpi RESPIRATORY: per hpi GASTROINTESTINAL: No anorexia, nausea, vomiting or diarrhea. No abdominal pain or blood.  GENITOURINARY: No burning on urination, no polyuria NEUROLOGICAL: No headache, dizziness, syncope, paralysis, ataxia, numbness or tingling in the extremities. No change in bowel or bladder control.  MUSCULOSKELETAL: No muscle, back pain, joint pain or stiffness.  LYMPHATICS: No enlarged nodes. No history of splenectomy.  PSYCHIATRIC: No history of depression or anxiety.  ENDOCRINOLOGIC: No reports of sweating, cold or heat intolerance. No polyuria or polydipsia.  Marland Kitchen   Physical Examination Today's Vitals    08/04/19 1239  BP: 124/82  Pulse: 98  SpO2: 98%  Weight: 206 lb 12.8 oz (93.8 kg)  Height: 5\' 9"  (1.753 m)   Body mass index is 30.54 kg/m.  Gen: resting comfortably, no acute distress HEENT: no scleral icterus, pupils equal round and reactive, no palptable cervical adenopathy,  CV: RRR, 2/6 systolic murmur rusb, no jvd Resp: Clear to auscultation bilaterally GI: abdomen is soft, non-tender, non-distended, normal bowel sounds, no hepatosplenomegaly MSK: extremities are warm, no edema.  Skin: warm, no rash Neuro:  no focal deficits Psych: appropriate affect      Assessment and Plan  1. Aortic stenosis - with recent SOB/DOE and abdominal distension repeat echo  2. CAD - s/p CABG.  - no recent chest pain, continue to monitor.    3.Afib -some recent palpitations. Given worsening fluid retention pending echo results may obtain an event monitor to see if uncontrolled arrhythmia contributing.  - EKG today shows afib rate high 90s  4. SOB - recent SOB/DOE and abdominal distension - continue high diuretic dosing, obtain echo      Arnoldo Lenis, M.D

## 2019-08-08 ENCOUNTER — Other Ambulatory Visit: Payer: Self-pay

## 2019-08-08 ENCOUNTER — Ambulatory Visit (HOSPITAL_COMMUNITY)
Admission: RE | Admit: 2019-08-08 | Discharge: 2019-08-08 | Disposition: A | Discharge status: Home / Self Care | Payer: Medicare Other | Source: Ambulatory Visit | Attending: Cardiology | Admitting: Cardiology

## 2019-08-08 ENCOUNTER — Other Ambulatory Visit (HOSPITAL_COMMUNITY)
Admission: RE | Admit: 2019-08-08 | Discharge: 2019-08-08 | Disposition: A | Discharge status: Home / Self Care | Payer: Medicare Other | Source: Ambulatory Visit | Attending: Cardiology | Admitting: Cardiology

## 2019-08-08 DIAGNOSIS — Z952 Presence of prosthetic heart valve: Secondary | ICD-10-CM | POA: Diagnosis not present

## 2019-08-08 DIAGNOSIS — E119 Type 2 diabetes mellitus without complications: Secondary | ICD-10-CM | POA: Diagnosis not present

## 2019-08-08 DIAGNOSIS — I509 Heart failure, unspecified: Secondary | ICD-10-CM | POA: Diagnosis not present

## 2019-08-08 DIAGNOSIS — E785 Hyperlipidemia, unspecified: Secondary | ICD-10-CM | POA: Diagnosis not present

## 2019-08-08 DIAGNOSIS — I4891 Unspecified atrial fibrillation: Secondary | ICD-10-CM | POA: Diagnosis not present

## 2019-08-08 DIAGNOSIS — I340 Nonrheumatic mitral (valve) insufficiency: Secondary | ICD-10-CM | POA: Insufficient documentation

## 2019-08-08 DIAGNOSIS — Z951 Presence of aortocoronary bypass graft: Secondary | ICD-10-CM | POA: Insufficient documentation

## 2019-08-08 DIAGNOSIS — I110 Hypertensive heart disease with heart failure: Secondary | ICD-10-CM | POA: Insufficient documentation

## 2019-08-08 DIAGNOSIS — R0602 Shortness of breath: Secondary | ICD-10-CM | POA: Diagnosis not present

## 2019-08-08 LAB — BASIC METABOLIC PANEL
Anion gap: 11 (ref 5–15)
BUN: 33 mg/dL — ABNORMAL HIGH (ref 8–23)
CO2: 26 mmol/L (ref 22–32)
Calcium: 9.3 mg/dL (ref 8.9–10.3)
Chloride: 102 mmol/L (ref 98–111)
Creatinine, Ser: 1.04 mg/dL (ref 0.61–1.24)
GFR calc Af Amer: >60 mL/min (ref >60)
GFR calc non Af Amer: >60 mL/min (ref >60)
Glucose, Bld: 144 mg/dL — ABNORMAL HIGH (ref 70–99)
Potassium: 4.1 mmol/L (ref 3.5–5.1)
Sodium: 139 mmol/L (ref 135–145)

## 2019-08-08 LAB — TSH: TSH: 1.889 u[IU]/mL (ref 0.350–4.500)

## 2019-08-08 LAB — BRAIN NATRIURETIC PEPTIDE: B Natriuretic Peptide: 243 pg/mL — ABNORMAL HIGH (ref 0.0–100.0)

## 2019-08-08 LAB — MAGNESIUM: Magnesium: 1.5 mg/dL — ABNORMAL LOW (ref 1.7–2.4)

## 2019-08-08 NOTE — Progress Notes (Signed)
*  PRELIMINARY RESULTS* Echocardiogram 2D Echocardiogram has been performed.  Samuel Germany 08/08/2019, 2:45 PM

## 2019-08-10 ENCOUNTER — Telehealth: Payer: Self-pay | Admitting: *Deleted

## 2019-08-10 NOTE — Telephone Encounter (Signed)
Pt aware - updated medication list - routed to pcp

## 2019-08-10 NOTE — Telephone Encounter (Signed)
-----   Message from Arnoldo Lenis, MD sent at 08/10/2019  3:54 PM EDT ----- Labs show magnesium is just a little low, can we start magnesium oxide 400mg  daily   Zandra Abts MD

## 2019-08-11 ENCOUNTER — Telehealth: Payer: Self-pay

## 2019-08-11 NOTE — Telephone Encounter (Signed)
Called pt. No answer. Left message for pt to return call.  

## 2019-08-11 NOTE — Telephone Encounter (Signed)
-----   Message from Arnoldo Lenis, MD sent at 08/10/2019  3:53 PM EDT ----- Echo is overall stable, no major changes in heart function. How is his fluid build up and breathing doing since our visit   Zandra Abts MD

## 2019-08-14 ENCOUNTER — Ambulatory Visit (INDEPENDENT_AMBULATORY_CARE_PROVIDER_SITE_OTHER): Payer: Medicare Other | Admitting: *Deleted

## 2019-08-14 ENCOUNTER — Other Ambulatory Visit: Payer: Self-pay

## 2019-08-14 DIAGNOSIS — Z5181 Encounter for therapeutic drug level monitoring: Secondary | ICD-10-CM

## 2019-08-14 DIAGNOSIS — Z9889 Other specified postprocedural states: Secondary | ICD-10-CM | POA: Diagnosis not present

## 2019-08-14 DIAGNOSIS — I4891 Unspecified atrial fibrillation: Secondary | ICD-10-CM | POA: Diagnosis not present

## 2019-08-14 DIAGNOSIS — R570 Cardiogenic shock: Secondary | ICD-10-CM

## 2019-08-14 DIAGNOSIS — Z953 Presence of xenogenic heart valve: Secondary | ICD-10-CM

## 2019-08-14 DIAGNOSIS — I9789 Other postprocedural complications and disorders of the circulatory system, not elsewhere classified: Secondary | ICD-10-CM

## 2019-08-14 LAB — POCT INR: INR: 2.8 (ref 2.0–3.0)

## 2019-08-14 NOTE — Patient Instructions (Signed)
Continue coumadin 4mg daily except 6mg on Tuesdays  Continue Vit K foods Recheck in 6 weeks 

## 2019-08-15 ENCOUNTER — Encounter: Payer: Self-pay | Admitting: Cardiology

## 2019-08-15 ENCOUNTER — Ambulatory Visit (INDEPENDENT_AMBULATORY_CARE_PROVIDER_SITE_OTHER): Payer: Medicare Other | Admitting: Cardiology

## 2019-08-15 VITALS — BP 99/66 | HR 102 | Ht 69.0 in | Wt 209.0 lb

## 2019-08-15 DIAGNOSIS — Z952 Presence of prosthetic heart valve: Secondary | ICD-10-CM | POA: Diagnosis not present

## 2019-08-15 DIAGNOSIS — R0602 Shortness of breath: Secondary | ICD-10-CM

## 2019-08-15 DIAGNOSIS — I2510 Atherosclerotic heart disease of native coronary artery without angina pectoris: Secondary | ICD-10-CM | POA: Diagnosis not present

## 2019-08-15 DIAGNOSIS — I4891 Unspecified atrial fibrillation: Secondary | ICD-10-CM | POA: Diagnosis not present

## 2019-08-15 MED ORDER — TORSEMIDE 20 MG PO TABS: ORAL_TABLET | ORAL | 1 refills | Status: DC

## 2019-08-15 MED ORDER — METOPROLOL SUCCINATE ER 50 MG PO TB24: ORAL_TABLET | ORAL | 1 refills | Status: DC

## 2019-08-15 NOTE — Progress Notes (Signed)
Clinical Summary Mr. Alikhan is a 73 y.o.male seen today for follow up of the following medical problems.   1. Aortic stenosis - history of bicuspid AV - history of AVR 10/22/2016, [facility] Ease Pericardial Tissue Valve (size 36mm, model # 3300TFX, serial # [id number]) along with 3 vessel CABG, MV repair, and repair of ascending aortic aneurysm   - OR on 10/22/16 for CABG X 3, AVR, MVR, repair of thoracic ascending aneurysm with placement of VAC on open chest Post op course significant for cardiogenic shock requiring multiple pressors, SAM requiring removal of mitral annuloplasty ring, persistent fevers, encephalopathy, shocked liver, DIC due to consumptive intraoperative coagulopathy, diffuse clotting with ischemia of distal BUE/BLE extremities, A fib, C diff colitis, volume overload and VDRF with difficulty with vent wean requiring tracheostomy. Sternal wound closed on 12/4 and was started on coumadin for RV thrombus and A fib.   [facility] course significant for MRSA bacteremia, pneumonitis, intermittent fevers, dysphagia requiring PEG tube placement on 1/15 by Dr. Kathlene Cote. He developed progressive gangrenous changes of BUE and BLE requiring B-transtibial amputation , left transmetacarpal amputation and Right MCP amputation of right hand by Dr. Sharol Given on 1/19.   01/2017 echo: 55-60%, no WMAs, grade II diastolic dysfunction. Normal functioning AV.     - recent SOB/DOE, fluid gain. We titrated his lasix and later changed to torsemide 20mg  bid then 40mg  bid - home weight up to 208 lbs initially, yesterday reported weight down to 203 lbs.  by our scales he was 208 lbs in 07/2018, today 205 lbs.  Toresemide was incrased 9/2 - breathign has been improving since higher diuretic dosing.   2. Mitral regurgitation - patient had MV ring placed during his surgery 10/22/17 along with AVR, ascending thoracic aneurysm repair, and CABG.  - ring was subsequently removed during that  operation due to significant SAM - 02/15/17 echo shows only mild MR    3. CAD - history of CABG 10/22/2017 (LIMA-Diag and distal LAD, SVG-LAD) 01/2017 echo: 55-60%, no WMAs, grade II diastolic dysfunction. Normal functioning AV.    - he denies any recent chest pain  4. Ischemic limbs - occurred in setting of cardiogenic shock requiring high dose pressors as well as DIC - required amputation - followed by ortho.  5.Afib/aflutter - initally had post op afib  - noted to be in aflutter at clinical f/uand thus was committed to long term anticoagulation    - mildly elevated rates low 100s by EKG last visit and vitals check today - mild palpitations at times  6. SOB -started late July - tried taking some extra diuretic on his own initially with some improvement.  - recent SOB/DOE, fluid gain. We titrated his lasix and later changed to torsemide 20mg  bid then 40mg  bid - home weight up to 208 lbs initially, yesterday reported weight down to 203 lbs.  by our scales he was 208 lbs in 07/2018, today 205 lbs.   - symptoms have improved with torsemide. Some orthopnea. - highest weight 209 lbs, down to 206 lbs as low as 204.  - some cough at times, some clear sputum which is chronic. - no chest pain. Occasional palpitations - moderate improvement   07/2019 echo LVEF 0000000, indet diastolic function, severe LAE, mild to mod MR, , dilated IVC, normal AVR  - has been on torsemide 40mg  bid - 08/08/19 labs Cr 1.04, BUN 33. K 4.1 Mg 1.5. Overall mild uptrend in Cr and BUN  - breathing improved since  last visit - home weights around 205 lbs. Feels like edema has improved.       Past Medical History:  Diagnosis Date  . Arthritis    "hips, shoulders; knees; back" (10/20/2016)  . Bicuspid aortic valve   . BPH (benign prostatic hypertrophy)   . Carpal tunnel syndrome of right wrist   . Coronary artery disease involving native coronary artery of native heart with unstable  angina pectoris ([place]) 10/20/2016  . Diverticulitis   . GERD (gastroesophageal reflux disease)   . Gout   . Heart murmur   . Hyperlipemia   . Hypertension   . Postoperative atrial fibrillation ([place]) 10/24/2016  . RLS (restless legs syndrome)   . S/P aortic valve replacement with bioprosthetic valve 10/22/2016   25 mm [facility] Ease bovine pericardial bioprosthetic tissue valve  . S/P ascending aortic aneurysm repair 10/22/2016   28 mm supracoronary straight graft replacement of ascending thoracic aortic aneurysm  . S/P CABG x 3 10/22/2016   Sequential LIMA to Diag and LAD, SVG to distal LAD, open vein harvest right thigh  . S/P mitral valve repair 10/22/2016   Artificial Gore-tex neochord placement x6 - posterior annuloplasty band placed but removed due to systolic anterior motion of mitral valve  . Severe aortic stenosis   . Snores    Never been tested for sleep apnea  . Thoracic ascending aortic aneurysm ([place]) 10/21/2016  . Thrombocytopenia ([place]) 10/22/2016  . Type II diabetes mellitus (H[facility])      Allergies  Allergen Reactions  . Sulfa Antibiotics Hives     Current Outpatient Medications  Medication Sig Dispense Refill  . allopurinol (ZYLOPRIM) 300 MG tablet Take 300 mg by mouth daily.    Marland Kitchen aspirin EC 81 MG tablet Take 81 mg by mouth daily.    Marland Kitchen atorvastatin (LIPITOR) 40 MG tablet TAKE 1 TABLET DAILY AT 6PM 90 tablet 1  . KLOR-CON M20 20 MEQ tablet TAKE 1 TABLET BY MOUTH TWICE A DAY 180 tablet 2  . lisinopril (ZESTRIL) 2.5 MG tablet TAKE 1 TABLET DAILY 90 tablet 1  . Magnesium Oxide (MAG-OX 400 PO) Take 400 mg by mouth daily.    . Melatonin 10 MG CAPS Take 10 mg by mouth at bedtime.     . metFORMIN (GLUCOPHAGE) 500 MG tablet Take 500 mg by mouth daily with breakfast. & 1000 mg by mouth in the evening    . metoprolol succinate (TOPROL-XL) 50 MG 24 hr tablet Take 50 mg by mouth 2 (two) times daily. Take with or immediately following a meal.    . nitroGLYCERIN (NITROSTAT)  0.4 MG SL tablet Place 1 tablet (0.4 mg total) under the tongue every 5 (five) minutes as needed. 25 tablet 3  . rOPINIRole (REQUIP) 2 MG tablet Take 2 mg by mouth See admin instructions. Takes 2mg  at 4:30pm and 2mg  at 9:30pm.    . tamsulosin (FLOMAX) 0.4 MG CAPS capsule Take 0.4 mg by mouth daily.    Marland Kitchen torsemide (DEMADEX) 20 MG tablet Take 2 tablets (40 mg total) by mouth 2 (two) times daily. 120 tablet 3  . warfarin (COUMADIN) 2 MG tablet TAKE 2 TABLETS DAILY EXCEPT 3 TABLETS ON TUESDAYS OR AS DIRECTED (Patient taking differently: TAKE 2 TABLETS DAILY EXCEPT 3 TABLETS ON TUESDAYS OR AS DIRECTED MANAGED BY LISA) 185 tablet 3   No current facility-administered medications for this visit.      Past Surgical History:  Procedure Laterality Date  . AMPUTATION Bilateral 12/11/2016   Procedure:  AMPUTATION BELOW KNEE BILATERALLY;  Surgeon: [name], MD;  Location: [place];  Service: Orthopedics;  Laterality: Bilateral;  . AMPUTATION Bilateral 12/11/2016   Procedure: AMPUTATION BILATERAL HANDS EXCEPT RIGHT THUMB;  Surgeon: [name], MD;  Location: [place];  Service: Orthopedics;  Laterality: Bilateral;  . AORTIC VALVE REPLACEMENT N/A 10/22/2016   Procedure: AORTIC VALVE REPLACEMENT (AVR) WITH SIZE 25 MM MAGNA EASE PERICARDIAL BIOPROSTHESIS - AORTIC;  Surgeon: Rexene Alberts, MD;  Location: [place];  Service: Open Heart Surgery;  Laterality: N/A;  . BONE EXCISION Right 02/01/2017   Procedure: EXCISION RIGHT INDEX METACARPAL HEAD;  Surgeon: [name], MD;  Location: [place];  Service: Orthopedics;  Laterality: Right;  . CARDIAC CATHETERIZATION N/A 10/20/2016   Procedure: Right/Left Heart Cath and Coronary Angiography;  Surgeon: Leonie Man, MD;  Location: [place] [facility];  Service: Cardiovascular;  Laterality: N/A;  . CARPAL TUNNEL RELEASE Right 11/28/2013   Procedure: RIGHT WRIST CARPAL TUNNEL RELEASE;  Surgeon: Lorn Junes, MD;  Location: [place];  Service: Orthopedics;   Laterality: Right;  . CATARACT EXTRACTION W/ INTRAOCULAR LENS  IMPLANT, BILATERAL Bilateral 1978  . COLONOSCOPY    . CORONARY ARTERY BYPASS GRAFT N/A 10/22/2016   Procedure: CORONARY ARTERY BYPASS GRAFTING (CABG)x 2 WITH LIMA TO DIAGONAL, OPEN  HARVESTING OF RIGHT SAPHENOUS VEIN FOR VEIN GRAFT TO LAD;  Surgeon: Rexene Alberts, MD;  Location: [place];  Service: Open Heart Surgery;  Laterality: N/A;  . FRACTURE SURGERY    . INGUINAL HERNIA REPAIR Right 1998  . IR GENERIC HISTORICAL  12/07/2016   IR US GUIDE VASC ACCESS RIGHT 12/07/2016 Aletta Edouard, MD [facility]  . IR GENERIC HISTORICAL  12/07/2016   IR RADIOLOGY PERIPHERAL GUIDED IV START 12/07/2016 Aletta Edouard, MD [facility]  . IR GENERIC HISTORICAL  12/07/2016   IR GASTROSTOMY TUBE MOD SED 12/07/2016 Aletta Edouard, MD [facility]  . LIPOMA EXCISION Right 2008   "side of my head"  . MITRAL VALVE REPAIR N/A 10/22/2016   Procedure: MITRAL VALVE REPAIR (MVR) WITH SIZE 30 SORIN ANNULOFLEX ANNULOPLASTY RING WITH SUBSEQUENT REMOVAL OF RING;  Surgeon: Rexene Alberts, MD;  Location: [place];  Service: Open Heart Surgery;  Laterality: N/A;  . PENECTOMY  2007   Peyronie's disease   . PENILE PROSTHESIS IMPLANT  2009  . REMOVAL OF PENILE PROSTHESIS N/A 02/01/2017   Procedure: REMOVAL OF PENILE PROSTHESIS;  Surgeon: Kathie Rhodes, MD;  Location: [place];  Service: Urology;  Laterality: N/A;  . SHOULDER OPEN ROTATOR CUFF REPAIR Right 2006  . STERNAL CLOSURE N/A 10/26/2016   Procedure: STERNAL WASHOUT AND DELAYED PRIMARY CLOSURE;  Surgeon: Rexene Alberts, MD;  Location: [place];  Service: Thoracic;  Laterality: N/A;  . TEE WITHOUT CARDIOVERSION N/A 10/26/2016   Procedure: TRANSESOPHAGEAL ECHOCARDIOGRAM (TEE);  Surgeon: Rexene Alberts, MD;  Location: [place];  Service: Thoracic;  Laterality: N/A;  . TEE WITHOUT CARDIOVERSION N/A 10/22/2016   Procedure: TRANSESOPHAGEAL ECHOCARDIOGRAM (TEE);  Surgeon: Rexene Alberts, MD;  Location: [place];  Service: Open  Heart Surgery;  Laterality: N/A;  . THORACIC AORTIC ANEURYSM REPAIR  10/22/2016   Procedure: ASCENDING AORTIC  ANEURYSM REPAIR (AAA) WITH 28 MM HEMASHIELD PLATINUM WOVEN DOUBLE VELOUR VASCULAR GRAFT;  Surgeon: Rexene Alberts, MD;  Location: [place];  Service: Open Heart Surgery;;  . TONSILLECTOMY  ~ 1955  . TRACHEOSTOMY TUBE PLACEMENT N/A 11/09/2016   Procedure: TRACHEOSTOMY;  Surgeon: Rexene Alberts,  MD;  Location: [place];  Service: Thoracic;  Laterality: N/A;  . TRANSURETHRAL RESECTION OF PROSTATE  2005  . TRIGGER FINGER RELEASE Bilateral    several lt and rt hands  . TRIGGER FINGER RELEASE Right 11/28/2013   Procedure: RIGHT TRIGGER FINGER  RELEASE (TENDON SHEATH INCISION);  Surgeon: Lorn Junes, MD;  Location: [place];  Service: Orthopedics;  Laterality: Right;  Marland Kitchen VIDEO BRONCHOSCOPY N/A 11/09/2016   Procedure: VIDEO BRONCHOSCOPY;  Surgeon: Rexene Alberts, MD;  Location: [place];  Service: Thoracic;  Laterality: N/A;  . WRIST FRACTURE SURGERY Right ~ [id number]     Allergies  Allergen Reactions  . Sulfa Antibiotics Hives      Family History  Problem Relation Age of Onset  . Lung cancer Mother   . Clotting disorder Father        No details  . Heart disease Sister 24       Stents  . Cancer Sister        Throat     Social History Mr. Resh reports that he quit smoking about 36 years ago. His smoking use included pipe and cigars. He quit after 3.00 years of use. He has never used smokeless tobacco. Mr. Mayeaux reports current alcohol use of about 10.0 standard drinks of alcohol per week.   Review of Systems CONSTITUTIONAL: No weight loss, fever, chills, weakness or fatigue.  HEENT: Eyes: No visual loss, blurred vision, double vision or yellow sclerae.No hearing loss, sneezing, congestion, runny nose or sore throat.  SKIN: No rash or itching.  CARDIOVASCULAR: per hpi RESPIRATORY: No shortness of breath, cough or sputum.  GASTROINTESTINAL: No anorexia, nausea,  vomiting or diarrhea. No abdominal pain or blood.  GENITOURINARY: No burning on urination, no polyuria NEUROLOGICAL: No headache, dizziness, syncope, paralysis, ataxia, numbness or tingling in the extremities. No change in bowel or bladder control.  MUSCULOSKELETAL: No muscle, back pain, joint pain or stiffness.  LYMPHATICS: No enlarged nodes. No history of splenectomy.  PSYCHIATRIC: No history of depression or anxiety.  ENDOCRINOLOGIC: No reports of sweating, cold or heat intolerance. No polyuria or polydipsia.  Marland Kitchen   Physical Examination Vitals:   08/15/19 1557  BP: 99/66  Pulse: (!) 102  SpO2: 97%   Filed Weights   08/15/19 1557  Weight: 209 lb (94.8 kg)    Gen: resting comfortably, no acute distress HEENT: no scleral icterus, pupils equal round and reactive, no palptable cervical adenopathy,  CV: irreg, 2/6 systolic murmur rusb, no jvd Resp: Clear to auscultation bilaterally GI: abdomen is soft, non-tender, non-distended, normal bowel sounds, no hepatosplenomegaly MSK: extremities are warm, no edema.  Skin: warm, no rash Neuro:  no focal deficits Psych: appropriate affect   Diagnostic Studies  07/2019 echo 1. The left ventricle has normal systolic function, with an ejection fraction of 55-60%. The cavity size was normal. There is moderately increased left ventricular wall thickness. Left ventricular diastolic Doppler parameters are indeterminate.  2. The right ventricle has mildly reduced systolic function. The cavity was moderately enlarged. There is not assessed.  3. The ventricular septum flattens in systole consistent with RV pressure overload.  4. Left atrial size was severely dilated.  5. Right atrial size was mildly dilated.  6. Mitral valve regurgitation mild to moderate MR, the jet is eccentric and may be underestimated. No evidence of mitral valve stenosis.  7. The aortic valve was not well visualized. No stenosis of the aortic valve.  8. [facility] Ease  Pericardial Tissue  Valve (size 39mm, model # 3300TFX is in the AV position.  9. The aorta is normal unless otherwise noted. 10. The aortic root is normal in size and structure. 11. Pulmonary hypertension is indeterminate, inadequate TR jet. 12. The inferior vena cava was dilated in size with >50% respiratory variability.   Assessment and Plan   1. Aortic stenosis s/p AVR - stable and normal functioning AVR by recent echo, continue to monitor  2. CAD - s/p CABG. - no chest pain, we will continue to monitor.    3.Afib -some recent palpitations, resting heart rates have been around 100. May be contributing to his recent SOB and some fluid overload - increase toprol to 75 mg daily.   4. SOB - recent SOB/DOE and abdominal distension - symptoms have improved with increased diuretic, lower dose of torsemide to 40mg  in AM and 20mg  in pm     Arnoldo Lenis, M.D.

## 2019-08-15 NOTE — Patient Instructions (Signed)
Your physician recommends that you schedule a follow-up appointment in: [place] has recommended you make the following change in your medication:   INCREASE TOPROL XL 75 MG (1 AND 1/2 TABLETS) TWICE DAILY   DECREASE TORSEMIDE 40 MG (2 TABLETS) IN THE MORNING AND 20 MG (1 TABLET) IN THE EVENING   Your physician recommends that you return for lab work 1 WEEK BMP/MG  Thank you for choosing [facility]!!

## 2019-08-16 ENCOUNTER — Telehealth: Payer: Self-pay | Admitting: *Deleted

## 2019-08-16 NOTE — Telephone Encounter (Signed)
Pt was here yesterday and says he thinks he told Dr Scott [name] the wrong dose that he was actually of Toprol XL - he was taking Toprol XL 25 mg bid and wanted to make sure that Dr Scott [name] wanted to increase Toprol XL 75 mg bid

## 2019-08-17 NOTE — Telephone Encounter (Signed)
Pt is returning call.  

## 2019-08-17 NOTE — Telephone Encounter (Signed)
If he was taking toprol 25mg  bid then I would increase to 50mg  bid and not 75mg  bid   Bettey Costa MD

## 2019-08-17 NOTE — Telephone Encounter (Signed)
Pt made aware - updated medication list

## 2019-08-22 ENCOUNTER — Other Ambulatory Visit (HOSPITAL_COMMUNITY)
Admission: RE | Admit: 2019-08-22 | Discharge: 2019-08-22 | Disposition: A | Discharge status: Home / Self Care | Payer: Medicare Other | Source: Ambulatory Visit | Attending: Cardiology | Admitting: Cardiology

## 2019-08-22 DIAGNOSIS — I4891 Unspecified atrial fibrillation: Secondary | ICD-10-CM | POA: Insufficient documentation

## 2019-08-22 LAB — BASIC METABOLIC PANEL
Anion gap: 8 (ref 5–15)
BUN: 30 mg/dL — ABNORMAL HIGH (ref 8–23)
CO2: 24 mmol/L (ref 22–32)
Calcium: 8.8 mg/dL — ABNORMAL LOW (ref 8.9–10.3)
Chloride: 106 mmol/L (ref 98–111)
Creatinine, Ser: 1.11 mg/dL (ref 0.61–1.24)
GFR calc Af Amer: >60 mL/min (ref >60)
GFR calc non Af Amer: >60 mL/min (ref >60)
Glucose, Bld: 167 mg/dL — ABNORMAL HIGH (ref 70–99)
Potassium: 4 mmol/L (ref 3.5–5.1)
Sodium: 138 mmol/L (ref 135–145)

## 2019-08-22 LAB — MAGNESIUM: Magnesium: 1.8 mg/dL (ref 1.7–2.4)

## 2019-08-23 DIAGNOSIS — I10 Essential (primary) hypertension: Secondary | ICD-10-CM | POA: Diagnosis not present

## 2019-08-23 DIAGNOSIS — E785 Hyperlipidemia, unspecified: Secondary | ICD-10-CM | POA: Diagnosis not present

## 2019-08-25 ENCOUNTER — Telehealth: Payer: Self-pay | Admitting: *Deleted

## 2019-08-25 DIAGNOSIS — I4891 Unspecified atrial fibrillation: Secondary | ICD-10-CM

## 2019-08-25 NOTE — Telephone Encounter (Signed)
Pt aware - says SOB is somewhat better but not back to normal and still gets weak/tired easily - has f/u in December

## 2019-08-25 NOTE — Telephone Encounter (Signed)
-----   Message from Arnoldo Lenis, MD sent at 08/23/2019  9:56 AM EDT ----- Labs look fine   J branch MD

## 2019-08-28 ENCOUNTER — Other Ambulatory Visit: Payer: Self-pay

## 2019-08-28 ENCOUNTER — Encounter (INDEPENDENT_AMBULATORY_CARE_PROVIDER_SITE_OTHER): Payer: Medicare Other

## 2019-08-28 DIAGNOSIS — I4891 Unspecified atrial fibrillation: Secondary | ICD-10-CM

## 2019-08-28 NOTE — Telephone Encounter (Signed)
Pt agreeable to Holter - will come today for placement

## 2019-08-28 NOTE — Addendum Note (Signed)
Addended by: Julian Hy T on: 08/28/2019 11:03 AM   Modules accepted: Orders

## 2019-08-28 NOTE — Telephone Encounter (Signed)
ERROR

## 2019-08-28 NOTE — Telephone Encounter (Signed)
He has had some high heart rates recently that could be playing a role, can we obtain a 48 hour holter monitor for afib for him.    Zandra Abts MD

## 2019-08-29 ENCOUNTER — Telehealth: Payer: Self-pay | Admitting: Cardiology

## 2019-08-29 NOTE — Telephone Encounter (Signed)
Has had several leads come off from monitor that was placed yesterday

## 2019-08-29 NOTE — Telephone Encounter (Signed)
Patient will come by office this morning for nurse to check.

## 2019-09-08 ENCOUNTER — Telehealth: Payer: Self-pay | Admitting: Cardiology

## 2019-09-08 NOTE — Telephone Encounter (Signed)
Pt calling regarding Holter monitor (in Epic) results - aware that once the provider results we would contact him

## 2019-09-08 NOTE — Telephone Encounter (Signed)
Resulted today   J Halayna Blane MD

## 2019-09-08 NOTE — Telephone Encounter (Signed)
Patient calling to request test results from last week. / tg

## 2019-09-08 NOTE — Telephone Encounter (Signed)
Heart monitor overall looks good, no significant abnormal rhythms, heart rates look good    J BrancH MD

## 2019-09-22 DIAGNOSIS — E782 Mixed hyperlipidemia: Secondary | ICD-10-CM | POA: Diagnosis not present

## 2019-09-22 DIAGNOSIS — E1121 Type 2 diabetes mellitus with diabetic nephropathy: Secondary | ICD-10-CM | POA: Diagnosis not present

## 2019-09-22 DIAGNOSIS — I10 Essential (primary) hypertension: Secondary | ICD-10-CM | POA: Diagnosis not present

## 2019-09-25 ENCOUNTER — Other Ambulatory Visit: Payer: Self-pay

## 2019-09-25 ENCOUNTER — Ambulatory Visit (INDEPENDENT_AMBULATORY_CARE_PROVIDER_SITE_OTHER): Payer: Medicare Other | Admitting: *Deleted

## 2019-09-25 DIAGNOSIS — Z9889 Other specified postprocedural states: Secondary | ICD-10-CM

## 2019-09-25 DIAGNOSIS — I9789 Other postprocedural complications and disorders of the circulatory system, not elsewhere classified: Secondary | ICD-10-CM

## 2019-09-25 DIAGNOSIS — I4891 Unspecified atrial fibrillation: Secondary | ICD-10-CM | POA: Diagnosis not present

## 2019-09-25 DIAGNOSIS — Z5181 Encounter for therapeutic drug level monitoring: Secondary | ICD-10-CM | POA: Diagnosis not present

## 2019-09-25 DIAGNOSIS — Z953 Presence of xenogenic heart valve: Secondary | ICD-10-CM | POA: Diagnosis not present

## 2019-09-25 LAB — POCT INR: INR: 1.9 — AB (ref 2.0–3.0)

## 2019-09-25 NOTE — Patient Instructions (Signed)
Take 6mg  tonight then resume 4mg  daily except 6mg  on Tuesdays  Continue Vit K foods Recheck in 6 weeks

## 2019-09-26 ENCOUNTER — Other Ambulatory Visit: Payer: Self-pay | Admitting: Cardiology

## 2019-10-02 ENCOUNTER — Other Ambulatory Visit: Payer: Self-pay | Admitting: Cardiology

## 2019-10-03 ENCOUNTER — Other Ambulatory Visit: Payer: Self-pay | Admitting: Cardiology

## 2019-10-23 DIAGNOSIS — E781 Pure hyperglyceridemia: Secondary | ICD-10-CM | POA: Diagnosis not present

## 2019-10-23 DIAGNOSIS — I10 Essential (primary) hypertension: Secondary | ICD-10-CM | POA: Diagnosis not present

## 2019-10-23 DIAGNOSIS — E782 Mixed hyperlipidemia: Secondary | ICD-10-CM | POA: Diagnosis not present

## 2019-10-23 DIAGNOSIS — E1159 Type 2 diabetes mellitus with other circulatory complications: Secondary | ICD-10-CM | POA: Diagnosis not present

## 2019-10-23 DIAGNOSIS — E7800 Pure hypercholesterolemia, unspecified: Secondary | ICD-10-CM | POA: Diagnosis not present

## 2019-10-23 DIAGNOSIS — E7801 Familial hypercholesterolemia: Secondary | ICD-10-CM | POA: Diagnosis not present

## 2019-10-26 DIAGNOSIS — E1140 Type 2 diabetes mellitus with diabetic neuropathy, unspecified: Secondary | ICD-10-CM | POA: Diagnosis not present

## 2019-10-26 DIAGNOSIS — I10 Essential (primary) hypertension: Secondary | ICD-10-CM | POA: Diagnosis not present

## 2019-10-26 DIAGNOSIS — N401 Enlarged prostate with lower urinary tract symptoms: Secondary | ICD-10-CM | POA: Diagnosis not present

## 2019-10-26 DIAGNOSIS — M1A9XX0 Chronic gout, unspecified, without tophus (tophi): Secondary | ICD-10-CM | POA: Diagnosis not present

## 2019-10-26 DIAGNOSIS — Z23 Encounter for immunization: Secondary | ICD-10-CM | POA: Diagnosis not present

## 2019-10-26 DIAGNOSIS — Z6830 Body mass index (BMI) 30.0-30.9, adult: Secondary | ICD-10-CM | POA: Diagnosis not present

## 2019-10-26 DIAGNOSIS — G2581 Restless legs syndrome: Secondary | ICD-10-CM | POA: Diagnosis not present

## 2019-10-26 DIAGNOSIS — E782 Mixed hyperlipidemia: Secondary | ICD-10-CM | POA: Diagnosis not present

## 2019-10-27 DIAGNOSIS — Z0001 Encounter for general adult medical examination with abnormal findings: Secondary | ICD-10-CM | POA: Diagnosis not present

## 2019-10-27 DIAGNOSIS — Z6830 Body mass index (BMI) 30.0-30.9, adult: Secondary | ICD-10-CM | POA: Diagnosis not present

## 2019-10-27 DIAGNOSIS — E1121 Type 2 diabetes mellitus with diabetic nephropathy: Secondary | ICD-10-CM | POA: Diagnosis not present

## 2019-10-27 DIAGNOSIS — I10 Essential (primary) hypertension: Secondary | ICD-10-CM | POA: Diagnosis not present

## 2019-10-27 DIAGNOSIS — E782 Mixed hyperlipidemia: Secondary | ICD-10-CM | POA: Diagnosis not present

## 2019-11-06 ENCOUNTER — Other Ambulatory Visit: Payer: Self-pay

## 2019-11-06 ENCOUNTER — Ambulatory Visit (INDEPENDENT_AMBULATORY_CARE_PROVIDER_SITE_OTHER): Payer: Medicare Other | Admitting: *Deleted

## 2019-11-06 DIAGNOSIS — I4891 Unspecified atrial fibrillation: Secondary | ICD-10-CM

## 2019-11-06 DIAGNOSIS — Z9889 Other specified postprocedural states: Secondary | ICD-10-CM

## 2019-11-06 DIAGNOSIS — I9789 Other postprocedural complications and disorders of the circulatory system, not elsewhere classified: Secondary | ICD-10-CM

## 2019-11-06 DIAGNOSIS — Z953 Presence of xenogenic heart valve: Secondary | ICD-10-CM | POA: Diagnosis not present

## 2019-11-06 DIAGNOSIS — Z5181 Encounter for therapeutic drug level monitoring: Secondary | ICD-10-CM | POA: Diagnosis not present

## 2019-11-06 LAB — POCT INR: INR: 2.2 (ref 2.0–3.0)

## 2019-11-06 NOTE — Patient Instructions (Signed)
Continue warfarin 4mg  daily except 6mg  on Tuesdays  Continue Vit K foods Recheck in 6 weeks

## 2019-11-22 ENCOUNTER — Other Ambulatory Visit: Payer: Self-pay

## 2019-11-22 ENCOUNTER — Ambulatory Visit (INDEPENDENT_AMBULATORY_CARE_PROVIDER_SITE_OTHER): Payer: Medicare Other | Admitting: Cardiology

## 2019-11-22 ENCOUNTER — Encounter: Payer: Self-pay | Admitting: Cardiology

## 2019-11-22 VITALS — BP 116/65 | HR 71 | Temp 97.8°F | Ht 69.0 in | Wt 212.0 lb

## 2019-11-22 DIAGNOSIS — I4891 Unspecified atrial fibrillation: Secondary | ICD-10-CM | POA: Diagnosis not present

## 2019-11-22 DIAGNOSIS — R0602 Shortness of breath: Secondary | ICD-10-CM | POA: Diagnosis not present

## 2019-11-22 DIAGNOSIS — Z952 Presence of prosthetic heart valve: Secondary | ICD-10-CM | POA: Diagnosis not present

## 2019-11-22 DIAGNOSIS — I2510 Atherosclerotic heart disease of native coronary artery without angina pectoris: Secondary | ICD-10-CM | POA: Diagnosis not present

## 2019-11-22 MED ORDER — METOPROLOL SUCCINATE ER 50 MG PO TB24: 50.0000 mg | ORAL_TABLET | Freq: Two times a day (BID) | ORAL | 3 refills | Status: DC

## 2019-11-22 NOTE — Progress Notes (Signed)
Clinical Summary Scott [name] is a 73 y.o.male seen today for follow up of the following medical problems.  1. Aortic stenosis - history of bicuspid AV - history of AVR11/30/2017, [facility] Meriter Ease Pericardial Tissue Valve (size 40mm, model # 3300TFX, serial # [id number]) along with 3 vessel CABG, MV repair, and repair of ascending aortic aneurysm   - OR on 10/22/16 for CABG X 3, AVR, MVR, repair of thoracic ascending aneurysm with placement of VAC on open chest Post op course significant for cardiogenic shock requiring multiple pressors, SAM requiring removal of mitral annuloplasty ring, persistent fevers, encephalopathy, shocked liver, DIC due to consumptive intraoperative coagulopathy, diffuse clotting with ischemia of distal BUE/BLE extremities, A fib, C diff colitis, volume overload and VDRF with difficulty with vent wean requiring tracheostomy. Sternal wound closed on 12/4 and was started on coumadin for RV thrombus and A fib.   [facility] course significant for MRSA bacteremia, pneumonitis, intermittent fevers, dysphagia requiring PEG tube placement on 1/15 by Dr. Kathlene Cote. He developed progressive gangrenous changes of BUE and BLE requiring B-transtibial amputation , left transmetacarpal amputation and Right MCP amputation of right hand by Dr. Sharol Given on 1/19.   01/2017 echo: 55-60%, no WMAs, grade II diastolic dysfunction. Normal functioning AV.   07/2019 echo: LVEF 55-60%, mild RV dysfunction, mild to mod MR - no recent SOB or DOE    2. Mitral regurgitation - patient had MV ring placed during his surgery 10/22/16 along with AVR, ascending thoracic aneurysm repair, and CABG.  - ring was subsequently removed during that operation due to significant SAM - 02/15/17 echo shows only mild MR    3. CAD - history of CABG 10/22/2017 (LIMA-Diag and distal LAD, SVG-LAD) 01/2017 echo: 55-60%, no WMAs, grade II diastolic dysfunction. Normal functioning AV.    -no recent  chest pain  4. Ischemic limbs - occurred in setting of cardiogenic shock requiring high dose pressors as well as DIC - required amputation - followed by ortho.  5.Afib/aflutter - initally had post op afib  - noted to be in aflutter at clinical f/uand thus was committed to long term anticoagulation    08/2019 monitor no significant arrhytmias - he did not increase toprol to 75mg  bid as discussed last visit. Since last visit the reported palpitations have resolved.     6. SOB -started late July - tried taking some extra diuretic on his own initially with some improvement.  - recent SOB/DOE, fluid gain. We titrated his lasix and later changed to torsemide 20mg  bid then 40mg  bid - home weight up to 208 lbs initially, yesterday reported weight down to 203 lbs. by our scales he was 208 lbs in 07/2018, today 205 lbs.  - symptoms have improved with torsemide. Some orthopnea. - highest weight 209 lbs, down to 206 lbs as low as 204.  - some cough at times, some clear sputum which is chronic. - no chest pain. Occasional palpitations - moderate improvement  07/2019 echo LVEF 0000000, indet diastolic function, severe LAE, mild to mod MR, , dilated IVC, normal AVR  - has been on torsemide 40mg  bid - 08/08/19 labs Cr 1.04, BUN 33. K 4.1 Mg 1.5. Overall mild uptrend in Cr and BUN  - reports recent weight gain due inactivity. By home scale around 211 lbs.  - he will take extra torsemide as needed based on swelling. Breathing remains improved.  Past Medical History:  Diagnosis Date  . Arthritis    "hips, shoulders; knees; back" (10/20/2016)  .  Bicuspid aortic valve   . BPH (benign prostatic hypertrophy)   . Carpal tunnel syndrome of right wrist   . Coronary artery disease involving native coronary artery of native heart with unstable angina pectoris ([place]) 10/20/2016  . Diverticulitis   . GERD (gastroesophageal reflux disease)   . Gout   . Heart murmur   . Hyperlipemia     . Hypertension   . Postoperative atrial fibrillation ([place]) 10/24/2016  . RLS (restless legs syndrome)   . S/P aortic valve replacement with bioprosthetic valve 10/22/2016   25 mm [facility] Ease bovine pericardial bioprosthetic tissue valve  . S/P ascending aortic aneurysm repair 10/22/2016   28 mm supracoronary straight graft replacement of ascending thoracic aortic aneurysm  . S/P CABG x 3 10/22/2016   Sequential LIMA to Diag and LAD, SVG to distal LAD, open vein harvest right thigh  . S/P mitral valve repair 10/22/2016   Artificial Gore-tex neochord placement x6 - posterior annuloplasty band placed but removed due to systolic anterior motion of mitral valve  . Severe aortic stenosis   . Snores    Never been tested for sleep apnea  . Thoracic ascending aortic aneurysm ([place]) 10/21/2016  . Thrombocytopenia ([place]) 10/22/2016  . Type II diabetes mellitus (H[facility])      Allergies  Allergen Reactions  . Sulfa Antibiotics Hives     Current Outpatient Medications  Medication Sig Dispense Refill  . allopurinol (ZYLOPRIM) 300 MG tablet Take 300 mg by mouth daily.    Marland Kitchen aspirin EC 81 MG tablet Take 81 mg by mouth daily.    Marland Kitchen atorvastatin (LIPITOR) 40 MG tablet TAKE 1 TABLET DAILY AT 6PM 90 tablet 1  . KLOR-CON M20 20 MEQ tablet TAKE 1 TABLET BY MOUTH TWICE A DAY 180 tablet 2  . lisinopril (ZESTRIL) 2.5 MG tablet TAKE 1 TABLET DAILY 90 tablet 1  . Magnesium Oxide (MAG-OX 400 PO) Take 400 mg by mouth daily.    . Melatonin 10 MG CAPS Take 10 mg by mouth at bedtime.     . metFORMIN (GLUCOPHAGE) 500 MG tablet Take 500 mg by mouth daily with breakfast. & 1000 mg by mouth in the evening    . metoprolol succinate (TOPROL-XL) 50 MG 24 hr tablet Take 50 mg by mouth 2 (two) times daily. Take with or immediately following a meal.    . metoprolol succinate (TOPROL-XL) 50 MG 24 hr tablet Take 1 tablet (50 mg total) by mouth 2 (two) times daily. 180 tablet 3  . nitroGLYCERIN (NITROSTAT) 0.4 MG SL tablet  Place 1 tablet (0.4 mg total) under the tongue every 5 (five) minutes as needed. 25 tablet 3  . rOPINIRole (REQUIP) 2 MG tablet Take 2 mg by mouth See admin instructions. Takes 2mg  at 4:30pm and 2mg  at 9:30pm.    . tamsulosin (FLOMAX) 0.4 MG CAPS capsule Take 0.4 mg by mouth daily.    Marland Kitchen torsemide (DEMADEX) 20 MG tablet TAKE 2 TABLETS IN THE MORNING AND 1 TABLET IN THE EVENING. MAY TAKE ADDITIONAL 1 TABLET AS NEEDED. DOSE DECREASE (NO LONGER ON FUROSEMIDE, 08/18/2019) 120 tablet 1  . warfarin (COUMADIN) 2 MG tablet TAKE 2 TABLETS DAILY EXCEPT 3 TABLETS ON TUESDAYS OR AS DIRECTED (Patient taking differently: TAKE 2 TABLETS DAILY EXCEPT 3 TABLETS ON TUESDAYS OR AS DIRECTED MANAGED BY LISA) 185 tablet 3   No current facility-administered medications for this visit.     Past Surgical History:  Procedure Laterality Date  . AMPUTATION Bilateral 12/11/2016  Procedure: AMPUTATION BELOW KNEE BILATERALLY;  Surgeon: [name], MD;  Location: [place];  Service: Orthopedics;  Laterality: Bilateral;  . AMPUTATION Bilateral 12/11/2016   Procedure: AMPUTATION BILATERAL HANDS EXCEPT RIGHT THUMB;  Surgeon: [name], MD;  Location: [place];  Service: Orthopedics;  Laterality: Bilateral;  . AORTIC VALVE REPLACEMENT N/A 10/22/2016   Procedure: AORTIC VALVE REPLACEMENT (AVR) WITH SIZE 25 MM MAGNA EASE PERICARDIAL BIOPROSTHESIS - AORTIC;  Surgeon: Rexene Alberts, MD;  Location: [place];  Service: Open Heart Surgery;  Laterality: N/A;  . BONE EXCISION Right 02/01/2017   Procedure: EXCISION RIGHT INDEX METACARPAL HEAD;  Surgeon: [name], MD;  Location: [place];  Service: Orthopedics;  Laterality: Right;  . CARDIAC CATHETERIZATION N/A 10/20/2016   Procedure: Right/Left Heart Cath and Coronary Angiography;  Surgeon: Leonie Man, MD;  Location: [place] [facility];  Service: Cardiovascular;  Laterality: N/A;  . CARPAL TUNNEL RELEASE Right 11/28/2013   Procedure: RIGHT WRIST CARPAL TUNNEL RELEASE;  Surgeon: Lorn Junes, MD;  Location: [place];  Service: Orthopedics;  Laterality: Right;  . CATARACT EXTRACTION W/ INTRAOCULAR LENS  IMPLANT, BILATERAL Bilateral 1978  . COLONOSCOPY    . CORONARY ARTERY BYPASS GRAFT N/A 10/22/2016   Procedure: CORONARY ARTERY BYPASS GRAFTING (CABG)x 2 WITH LIMA TO DIAGONAL, OPEN  HARVESTING OF RIGHT SAPHENOUS VEIN FOR VEIN GRAFT TO LAD;  Surgeon: Rexene Alberts, MD;  Location: [place];  Service: Open Heart Surgery;  Laterality: N/A;  . FRACTURE SURGERY    . INGUINAL HERNIA REPAIR Right 1998  . IR GENERIC HISTORICAL  12/07/2016   IR US GUIDE VASC ACCESS RIGHT 12/07/2016 Aletta Edouard, MD [facility]  . IR GENERIC HISTORICAL  12/07/2016   IR RADIOLOGY PERIPHERAL GUIDED IV START 12/07/2016 Aletta Edouard, MD [facility]  . IR GENERIC HISTORICAL  12/07/2016   IR GASTROSTOMY TUBE MOD SED 12/07/2016 Aletta Edouard, MD [facility]  . LIPOMA EXCISION Right 2008   "side of my head"  . MITRAL VALVE REPAIR N/A 10/22/2016   Procedure: MITRAL VALVE REPAIR (MVR) WITH SIZE 30 SORIN ANNULOFLEX ANNULOPLASTY RING WITH SUBSEQUENT REMOVAL OF RING;  Surgeon: Rexene Alberts, MD;  Location: [place];  Service: Open Heart Surgery;  Laterality: N/A;  . PENECTOMY  2007   Peyronie's disease   . PENILE PROSTHESIS IMPLANT  2009  . REMOVAL OF PENILE PROSTHESIS N/A 02/01/2017   Procedure: REMOVAL OF PENILE PROSTHESIS;  Surgeon: Kathie Rhodes, MD;  Location: [place];  Service: Urology;  Laterality: N/A;  . SHOULDER OPEN ROTATOR CUFF REPAIR Right 2006  . STERNAL CLOSURE N/A 10/26/2016   Procedure: STERNAL WASHOUT AND DELAYED PRIMARY CLOSURE;  Surgeon: Rexene Alberts, MD;  Location: [place];  Service: Thoracic;  Laterality: N/A;  . TEE WITHOUT CARDIOVERSION N/A 10/26/2016   Procedure: TRANSESOPHAGEAL ECHOCARDIOGRAM (TEE);  Surgeon: Rexene Alberts, MD;  Location: [place];  Service: Thoracic;  Laterality: N/A;  . TEE WITHOUT CARDIOVERSION N/A 10/22/2016   Procedure: TRANSESOPHAGEAL  ECHOCARDIOGRAM (TEE);  Surgeon: Rexene Alberts, MD;  Location: [place];  Service: Open Heart Surgery;  Laterality: N/A;  . THORACIC AORTIC ANEURYSM REPAIR  10/22/2016   Procedure: ASCENDING AORTIC  ANEURYSM REPAIR (AAA) WITH 28 MM HEMASHIELD PLATINUM WOVEN DOUBLE VELOUR VASCULAR GRAFT;  Surgeon: Rexene Alberts, MD;  Location: [place];  Service: Open Heart Surgery;;  . TONSILLECTOMY  ~ 1955  . TRACHEOSTOMY TUBE PLACEMENT N/A 11/09/2016   Procedure: TRACHEOSTOMY;  Surgeon: Valentina Gu  Roxy Manns, MD;  Location: [place];  Service: Thoracic;  Laterality: N/A;  . TRANSURETHRAL RESECTION OF PROSTATE  2005  . TRIGGER FINGER RELEASE Bilateral    several lt and rt hands  . TRIGGER FINGER RELEASE Right 11/28/2013   Procedure: RIGHT TRIGGER FINGER  RELEASE (TENDON SHEATH INCISION);  Surgeon: [place] Junes, MD;  Location: [place];  Service: Orthopedics;  Laterality: Right;  Marland Kitchen VIDEO BRONCHOSCOPY N/A 11/09/2016   Procedure: VIDEO BRONCHOSCOPY;  Surgeon: Rexene Alberts, MD;  Location: [place];  Service: Thoracic;  Laterality: N/A;  . WRIST FRACTURE SURGERY Right ~ [id number]     Allergies  Allergen Reactions  . Sulfa Antibiotics Hives      Family History  Problem Relation Age of Onset  . Lung cancer Mother   . Clotting disorder Father        No details  . Heart disease Sister 12       Stents  . Cancer Sister        Throat     Social History Mr. Cothern reports that he quit smoking about 37 years ago. His smoking use included pipe and cigars. He quit after 3.00 years of use. He has never used smokeless tobacco. Mr. Sico reports current alcohol use of about 10.0 standard drinks of alcohol per week.   Review of Systems CONSTITUTIONAL: No weight loss, fever, chills, weakness or fatigue.  HEENT: Eyes: No visual loss, blurred vision, double vision or yellow sclerae.No hearing loss, sneezing, congestion, runny nose or sore throat.  SKIN: No rash or itching.  CARDIOVASCULAR: per  hpi RESPIRATORY: No shortness of breath, cough or sputum.  GASTROINTESTINAL: No anorexia, nausea, vomiting or diarrhea. No abdominal pain or blood.  GENITOURINARY: No burning on urination, no polyuria NEUROLOGICAL: No headache, dizziness, syncope, paralysis, ataxia, numbness or tingling in the extremities. No change in bowel or bladder control.  MUSCULOSKELETAL: No muscle, back pain, joint pain or stiffness.  LYMPHATICS: No enlarged nodes. No history of splenectomy.  PSYCHIATRIC: No history of depression or anxiety.  ENDOCRINOLOGIC: No reports of sweating, cold or heat intolerance. No polyuria or polydipsia.  Marland Kitchen   Physical Examination Today's Vitals   11/22/19 0814  BP: 116/65  Pulse: 71  Temp: 97.8 F (36.6 C)  TempSrc: Temporal  SpO2: 96%  Weight: 212 lb (96.2 kg)  Height: 5\' 9"  (1.753 m)   Body mass index is 31.31 kg/m.  Gen: resting comfortably, no acute distress HEENT: no scleral icterus, pupils equal round and reactive, no palptable cervical adenopathy,  CV: RRR, 2/6 systolic murmur rusb, 2/6 systolic murmur apex, no jvd Resp: Clear to auscultation bilaterally GI: abdomen is soft, non-tender, non-distended, normal bowel sounds, no hepatosplenomegaly MSK: extremities are warm, no edema.  Skin: warm, no rash Neuro:  no focal deficits Psych: appropriate affect   Diagnostic Studies  07/2019 echo 1. The left ventricle has normal systolic function, with an ejection fraction of 55-60%. The cavity size was normal. There is moderately increased left ventricular wall thickness. Left ventricular diastolic Doppler parameters are indeterminate. 2. The right ventricle has mildly reduced systolic function. The cavity was moderately enlarged. There is not assessed. 3. The ventricular septum flattens in systole consistent with RV pressure overload. 4. Left atrial size was severely dilated. 5. Right atrial size was mildly dilated. 6. Mitral valve regurgitation mild to moderate  MR, the jet is eccentric and may be underestimated. No evidence of mitral valve stenosis. 7. The aortic valve was not well visualized. No  stenosis of the aortic valve. 8. [facility] Magna Ease Pericardial Tissue Valve (size 62mm, model # 3300TFX is in the AV position. 9. The aorta is normal unless otherwise noted. 10. The aortic root is normal in size and structure. 11. Pulmonary hypertension is indeterminate, inadequate TR jet. 12. The inferior vena cava was dilated in size with >50% respiratory variability.   Assessment and Plan  1. Aortic stenosis s/p AVR -stable and normal functioning AVR by recent echo - no recent symptoms, conitnue to monitor.   2. CAD - s/p CABG. -- no recent symptoms, continue current meds   3.Afib -no recent symptoms, continue current meds. He will be on toprol 50mg  bid  4. SOB - resolved with adjustment in diuretic dosing, continue current regimen.    F/u 6 months [place] office   Arnoldo Lenis, M.D.

## 2019-11-22 NOTE — Patient Instructions (Signed)

## 2019-12-01 ENCOUNTER — Other Ambulatory Visit: Payer: Self-pay | Admitting: Cardiology

## 2019-12-18 ENCOUNTER — Other Ambulatory Visit: Payer: Self-pay

## 2019-12-18 ENCOUNTER — Ambulatory Visit (INDEPENDENT_AMBULATORY_CARE_PROVIDER_SITE_OTHER): Payer: Medicare Other | Admitting: *Deleted

## 2019-12-18 DIAGNOSIS — Z5181 Encounter for therapeutic drug level monitoring: Secondary | ICD-10-CM | POA: Diagnosis not present

## 2019-12-18 DIAGNOSIS — Z953 Presence of xenogenic heart valve: Secondary | ICD-10-CM | POA: Diagnosis not present

## 2019-12-18 DIAGNOSIS — I9789 Other postprocedural complications and disorders of the circulatory system, not elsewhere classified: Secondary | ICD-10-CM

## 2019-12-18 DIAGNOSIS — I4891 Unspecified atrial fibrillation: Secondary | ICD-10-CM | POA: Diagnosis not present

## 2019-12-18 DIAGNOSIS — Z9889 Other specified postprocedural states: Secondary | ICD-10-CM

## 2019-12-18 LAB — POCT INR: INR: 2 (ref 2.0–3.0)

## 2019-12-18 NOTE — Patient Instructions (Signed)
Continue warfarin 4mg  daily except 6mg  on Tuesdays  Continue Vit K foods Recheck in 6 weeks

## 2019-12-22 DIAGNOSIS — I10 Essential (primary) hypertension: Secondary | ICD-10-CM | POA: Diagnosis not present

## 2019-12-22 DIAGNOSIS — E1121 Type 2 diabetes mellitus with diabetic nephropathy: Secondary | ICD-10-CM | POA: Diagnosis not present

## 2019-12-24 ENCOUNTER — Other Ambulatory Visit: Payer: Self-pay | Admitting: Cardiology

## 2019-12-25 ENCOUNTER — Other Ambulatory Visit: Payer: Self-pay | Admitting: *Deleted

## 2019-12-25 MED ORDER — LISINOPRIL 2.5 MG PO TABS: 2.5000 mg | ORAL_TABLET | Freq: Every day | ORAL | 1 refills | Status: DC

## 2019-12-25 MED ORDER — POTASSIUM CHLORIDE CRYS ER 20 MEQ PO TBCR: 20.0000 meq | EXTENDED_RELEASE_TABLET | Freq: Two times a day (BID) | ORAL | 1 refills | Status: DC

## 2020-01-16 ENCOUNTER — Telehealth: Payer: Self-pay | Admitting: *Deleted

## 2020-01-16 DIAGNOSIS — R195 Other fecal abnormalities: Secondary | ICD-10-CM | POA: Diagnosis not present

## 2020-01-16 DIAGNOSIS — R58 Hemorrhage, not elsewhere classified: Secondary | ICD-10-CM | POA: Diagnosis not present

## 2020-01-16 NOTE — Telephone Encounter (Signed)
-----   Message from Leeroy Bock, [facility] sent at 01/16/2020  4:58 PM EST ----- Should be ok to hold 5 days, his valve was a repair and he's still on anticoag for afib with CHADS2VASC score of 5 (age, CHF, HTN, DM, CAD).   Thanks, Jinny Blossom ----- Message ----- From: Malen Gauze, RN Sent: 01/16/2020   3:15 PM EST To: Leeroy Bock, RPH  Request from Dr Venora Maples to hold warfarin 5 days prior to colonoscopy.  Please advise. Lattie Haw

## 2020-01-16 NOTE — Telephone Encounter (Signed)
OK faxed to Dr Apolonio Schneiders office.

## 2020-01-28 ENCOUNTER — Other Ambulatory Visit: Payer: Self-pay | Admitting: Cardiology

## 2020-01-29 ENCOUNTER — Ambulatory Visit (INDEPENDENT_AMBULATORY_CARE_PROVIDER_SITE_OTHER): Payer: Medicare Other | Admitting: *Deleted

## 2020-01-29 ENCOUNTER — Other Ambulatory Visit: Payer: Self-pay | Admitting: *Deleted

## 2020-01-29 ENCOUNTER — Other Ambulatory Visit: Payer: Self-pay

## 2020-01-29 DIAGNOSIS — Z01818 Encounter for other preprocedural examination: Secondary | ICD-10-CM | POA: Diagnosis not present

## 2020-01-29 DIAGNOSIS — I4891 Unspecified atrial fibrillation: Secondary | ICD-10-CM

## 2020-01-29 DIAGNOSIS — Z953 Presence of xenogenic heart valve: Secondary | ICD-10-CM | POA: Diagnosis not present

## 2020-01-29 DIAGNOSIS — I9789 Other postprocedural complications and disorders of the circulatory system, not elsewhere classified: Secondary | ICD-10-CM

## 2020-01-29 DIAGNOSIS — Z9889 Other specified postprocedural states: Secondary | ICD-10-CM

## 2020-01-29 DIAGNOSIS — Z5181 Encounter for therapeutic drug level monitoring: Secondary | ICD-10-CM

## 2020-01-29 LAB — POCT INR: INR: 1.3 — AB (ref 2.0–3.0)

## 2020-01-29 MED ORDER — TORSEMIDE 20 MG PO TABS: ORAL_TABLET | ORAL | 2 refills | Status: DC

## 2020-01-29 NOTE — Patient Instructions (Signed)
Pending colonoscopy 01/31/20.  Took last dose of warfarin 01/25/20 Restart warfarin night of procedure if OK with MD.  Take 2 tablets daily except 6mg  on Tuesdays  Continue Vit K foods Recheck 7-10 days after procedure

## 2020-01-31 DIAGNOSIS — Z89512 Acquired absence of left leg below knee: Secondary | ICD-10-CM | POA: Diagnosis not present

## 2020-01-31 DIAGNOSIS — E119 Type 2 diabetes mellitus without complications: Secondary | ICD-10-CM | POA: Diagnosis not present

## 2020-01-31 DIAGNOSIS — I4891 Unspecified atrial fibrillation: Secondary | ICD-10-CM | POA: Diagnosis not present

## 2020-01-31 DIAGNOSIS — Z79899 Other long term (current) drug therapy: Secondary | ICD-10-CM | POA: Diagnosis not present

## 2020-01-31 DIAGNOSIS — G2581 Restless legs syndrome: Secondary | ICD-10-CM | POA: Diagnosis not present

## 2020-01-31 DIAGNOSIS — R011 Cardiac murmur, unspecified: Secondary | ICD-10-CM | POA: Diagnosis not present

## 2020-01-31 DIAGNOSIS — Z7901 Long term (current) use of anticoagulants: Secondary | ICD-10-CM | POA: Diagnosis not present

## 2020-01-31 DIAGNOSIS — E7800 Pure hypercholesterolemia, unspecified: Secondary | ICD-10-CM | POA: Diagnosis not present

## 2020-01-31 DIAGNOSIS — K5730 Diverticulosis of large intestine without perforation or abscess without bleeding: Secondary | ICD-10-CM | POA: Diagnosis not present

## 2020-01-31 DIAGNOSIS — Z89511 Acquired absence of right leg below knee: Secondary | ICD-10-CM | POA: Diagnosis not present

## 2020-01-31 DIAGNOSIS — N400 Enlarged prostate without lower urinary tract symptoms: Secondary | ICD-10-CM | POA: Diagnosis not present

## 2020-01-31 DIAGNOSIS — R195 Other fecal abnormalities: Secondary | ICD-10-CM | POA: Diagnosis not present

## 2020-01-31 DIAGNOSIS — Z7982 Long term (current) use of aspirin: Secondary | ICD-10-CM | POA: Diagnosis not present

## 2020-01-31 DIAGNOSIS — I10 Essential (primary) hypertension: Secondary | ICD-10-CM | POA: Diagnosis not present

## 2020-01-31 DIAGNOSIS — Z7984 Long term (current) use of oral hypoglycemic drugs: Secondary | ICD-10-CM | POA: Diagnosis not present

## 2020-01-31 DIAGNOSIS — K640 First degree hemorrhoids: Secondary | ICD-10-CM | POA: Diagnosis not present

## 2020-01-31 DIAGNOSIS — M109 Gout, unspecified: Secondary | ICD-10-CM | POA: Diagnosis not present

## 2020-02-07 DIAGNOSIS — Z23 Encounter for immunization: Secondary | ICD-10-CM | POA: Diagnosis not present

## 2020-02-08 ENCOUNTER — Other Ambulatory Visit: Payer: Self-pay

## 2020-02-08 ENCOUNTER — Ambulatory Visit (INDEPENDENT_AMBULATORY_CARE_PROVIDER_SITE_OTHER): Payer: Medicare Other | Admitting: *Deleted

## 2020-02-08 DIAGNOSIS — Z9889 Other specified postprocedural states: Secondary | ICD-10-CM

## 2020-02-08 DIAGNOSIS — Z5181 Encounter for therapeutic drug level monitoring: Secondary | ICD-10-CM

## 2020-02-08 DIAGNOSIS — I9789 Other postprocedural complications and disorders of the circulatory system, not elsewhere classified: Secondary | ICD-10-CM

## 2020-02-08 DIAGNOSIS — I4891 Unspecified atrial fibrillation: Secondary | ICD-10-CM | POA: Diagnosis not present

## 2020-02-08 DIAGNOSIS — Z953 Presence of xenogenic heart valve: Secondary | ICD-10-CM

## 2020-02-08 LAB — POCT INR: INR: 1.5 — AB (ref 2.0–3.0)

## 2020-02-08 NOTE — Patient Instructions (Signed)
Take 3 tablets tonight and tomorrow night then resume 2 tablets daily except 3 tablets on Tuesdays  Continue Vit K foods Recheck 3 wks

## 2020-02-21 DIAGNOSIS — E7849 Other hyperlipidemia: Secondary | ICD-10-CM | POA: Diagnosis not present

## 2020-02-21 DIAGNOSIS — I10 Essential (primary) hypertension: Secondary | ICD-10-CM | POA: Diagnosis not present

## 2020-02-29 ENCOUNTER — Ambulatory Visit (INDEPENDENT_AMBULATORY_CARE_PROVIDER_SITE_OTHER): Payer: Medicare Other | Admitting: *Deleted

## 2020-02-29 ENCOUNTER — Other Ambulatory Visit: Payer: Self-pay

## 2020-02-29 DIAGNOSIS — Z953 Presence of xenogenic heart valve: Secondary | ICD-10-CM

## 2020-02-29 DIAGNOSIS — I9789 Other postprocedural complications and disorders of the circulatory system, not elsewhere classified: Secondary | ICD-10-CM | POA: Diagnosis not present

## 2020-02-29 DIAGNOSIS — Z9889 Other specified postprocedural states: Secondary | ICD-10-CM | POA: Diagnosis not present

## 2020-02-29 DIAGNOSIS — Z5181 Encounter for therapeutic drug level monitoring: Secondary | ICD-10-CM | POA: Diagnosis not present

## 2020-02-29 DIAGNOSIS — I4891 Unspecified atrial fibrillation: Secondary | ICD-10-CM | POA: Diagnosis not present

## 2020-02-29 LAB — POCT INR: INR: 2.2 (ref 2.0–3.0)

## 2020-02-29 NOTE — Patient Instructions (Signed)
Continue warfarin 2 tablets daily except 3 tablets on Tuesdays  Continue Vit K foods Recheck 5 wks

## 2020-03-04 DIAGNOSIS — K2100 Gastro-esophageal reflux disease with esophagitis, without bleeding: Secondary | ICD-10-CM | POA: Diagnosis not present

## 2020-03-04 DIAGNOSIS — E782 Mixed hyperlipidemia: Secondary | ICD-10-CM | POA: Diagnosis not present

## 2020-03-04 DIAGNOSIS — I10 Essential (primary) hypertension: Secondary | ICD-10-CM | POA: Diagnosis not present

## 2020-03-04 DIAGNOSIS — E1121 Type 2 diabetes mellitus with diabetic nephropathy: Secondary | ICD-10-CM | POA: Diagnosis not present

## 2020-03-06 DIAGNOSIS — Z23 Encounter for immunization: Secondary | ICD-10-CM | POA: Diagnosis not present

## 2020-03-11 DIAGNOSIS — N401 Enlarged prostate with lower urinary tract symptoms: Secondary | ICD-10-CM | POA: Diagnosis not present

## 2020-03-11 DIAGNOSIS — I10 Essential (primary) hypertension: Secondary | ICD-10-CM | POA: Diagnosis not present

## 2020-03-11 DIAGNOSIS — E6609 Other obesity due to excess calories: Secondary | ICD-10-CM | POA: Diagnosis not present

## 2020-03-11 DIAGNOSIS — E782 Mixed hyperlipidemia: Secondary | ICD-10-CM | POA: Diagnosis not present

## 2020-03-11 DIAGNOSIS — G2581 Restless legs syndrome: Secondary | ICD-10-CM | POA: Diagnosis not present

## 2020-03-11 DIAGNOSIS — M1A9XX0 Chronic gout, unspecified, without tophus (tophi): Secondary | ICD-10-CM | POA: Diagnosis not present

## 2020-03-11 DIAGNOSIS — Z6831 Body mass index (BMI) 31.0-31.9, adult: Secondary | ICD-10-CM | POA: Diagnosis not present

## 2020-03-11 DIAGNOSIS — E1140 Type 2 diabetes mellitus with diabetic neuropathy, unspecified: Secondary | ICD-10-CM | POA: Diagnosis not present

## 2020-04-03 ENCOUNTER — Ambulatory Visit (INDEPENDENT_AMBULATORY_CARE_PROVIDER_SITE_OTHER): Payer: Medicare Other | Admitting: *Deleted

## 2020-04-03 ENCOUNTER — Other Ambulatory Visit: Payer: Self-pay

## 2020-04-03 DIAGNOSIS — I9789 Other postprocedural complications and disorders of the circulatory system, not elsewhere classified: Secondary | ICD-10-CM

## 2020-04-03 DIAGNOSIS — Z5181 Encounter for therapeutic drug level monitoring: Secondary | ICD-10-CM | POA: Diagnosis not present

## 2020-04-03 DIAGNOSIS — Z953 Presence of xenogenic heart valve: Secondary | ICD-10-CM

## 2020-04-03 DIAGNOSIS — Z9889 Other specified postprocedural states: Secondary | ICD-10-CM

## 2020-04-03 DIAGNOSIS — I4891 Unspecified atrial fibrillation: Secondary | ICD-10-CM | POA: Diagnosis not present

## 2020-04-03 LAB — POCT INR: INR: 2 (ref 2.0–3.0)

## 2020-04-03 NOTE — Patient Instructions (Signed)
Take 3 tablets tonight then resume 2 tablets daily except 3 tablets on Tuesdays  Continue Vit K foods Recheck 6 wks

## 2020-05-08 ENCOUNTER — Ambulatory Visit (INDEPENDENT_AMBULATORY_CARE_PROVIDER_SITE_OTHER): Payer: Medicare Other | Admitting: Cardiology

## 2020-05-08 ENCOUNTER — Encounter: Payer: Self-pay | Admitting: *Deleted

## 2020-05-08 ENCOUNTER — Encounter: Payer: Self-pay | Admitting: Cardiology

## 2020-05-08 ENCOUNTER — Ambulatory Visit (INDEPENDENT_AMBULATORY_CARE_PROVIDER_SITE_OTHER): Payer: Medicare Other | Admitting: *Deleted

## 2020-05-08 VITALS — BP 114/62 | HR 69 | Ht 68.5 in | Wt 213.2 lb

## 2020-05-08 DIAGNOSIS — I9789 Other postprocedural complications and disorders of the circulatory system, not elsewhere classified: Secondary | ICD-10-CM

## 2020-05-08 DIAGNOSIS — Z953 Presence of xenogenic heart valve: Secondary | ICD-10-CM | POA: Diagnosis not present

## 2020-05-08 DIAGNOSIS — Z5181 Encounter for therapeutic drug level monitoring: Secondary | ICD-10-CM

## 2020-05-08 DIAGNOSIS — Z952 Presence of prosthetic heart valve: Secondary | ICD-10-CM | POA: Diagnosis not present

## 2020-05-08 DIAGNOSIS — I4891 Unspecified atrial fibrillation: Secondary | ICD-10-CM

## 2020-05-08 DIAGNOSIS — Z9889 Other specified postprocedural states: Secondary | ICD-10-CM

## 2020-05-08 DIAGNOSIS — I2510 Atherosclerotic heart disease of native coronary artery without angina pectoris: Secondary | ICD-10-CM | POA: Diagnosis not present

## 2020-05-08 LAB — POCT INR: INR: 2.2 (ref 2.0–3.0)

## 2020-05-08 NOTE — Progress Notes (Signed)
Clinical Summary Mr. Harriger is a 74 y.o.male seen today for follow up of the following medical problems.  1. Aortic stenosis - history of bicuspid AV - history of AVR11/30/2017, [facility] Ease Pericardial Tissue Valve (size 48mm, model # 3300TFX, serial # [id number]) along with 3 vessel CABG, MV repair, and repair of ascending aortic aneurysm   - OR on 10/22/16 for CABG X 3, AVR, MVR, repair of thoracic ascending aneurysm with placement of VAC on open chest Post op course significant for cardiogenic shock requiring multiple pressors, SAM requiring removal of mitral annuloplasty ring, persistent fevers, encephalopathy, shocked liver, DIC due to consumptive intraoperative coagulopathy, diffuse clotting with ischemia of distal BUE/BLE extremities, A fib, C diff colitis, volume overload and VDRF with difficulty with vent wean requiring tracheostomy. Sternal wound closed on 12/4 and was started on coumadin for RV thrombus and A fib.   [facility] course significant for MRSA bacteremia, pneumonitis, intermittent fevers, dysphagia requiring PEG tube placement on 1/15 by Dr. Kathlene Cote. He developed progressive gangrenous changes of BUE and BLE requiring B-transtibial amputation , left transmetacarpal amputation and Right MCP amputation of right hand by Dr. Sharol Given on 1/19.   01/2017 echo: 55-60%, no WMAs, grade II diastolic dysfunction. Normal functioning AV.   07/2019 echo: LVEF 55-60%, mild RV dysfunction, mild to mod MR - no recent SOB or DOE   - no SOB/DOE, no fluid retention    2. Mitral regurgitation - patient had MV ring placed during his surgery 10/22/16 along with AVR, ascending thoracic aneurysm repair, and CABG.  - ring was subsequently removed during that operation due to significant SAM - 02/15/17 echo shows only mild MR    3. CAD - history of CABG 10/22/2017 (LIMA-Diag and distal LAD, SVG-LAD) 01/2017 echo: 55-60%, no WMAs, grade II diastolic dysfunction.  Normal functioning AV.    -denies any chest pain  4. Ischemic limbs - occurred in setting of cardiogenic shock requiring high dose pressors as well as DIC - required amputation - followed by ortho.  5.Afib/aflutter - initally had post op afib  - noted to be in aflutter at clinical f/uand thus was committed to long term anticoagulation 08/2019 monitor no significant arrhytmias - - palpitations at times, though mild - moderate amount of caffeine.    6. SOB 07/2019 echo LVEF 10-62%, indet diastolic function, severe LAE, mild to mod MR, , dilated IVC, normal AVR  - has been on torsemide 40mg  bid - 08/08/19 labs Cr 1.04, BUN 33. K 4.1 Mg 1.5. Overall mild uptrend in Cr and BUN  - reports recent weight gain due inactivity. By home scale around 211 lbs.  - he will take extra torsemide as needed based on swelling. Breathing remains improved.     SH: completed covid vaccine x2    Past Medical History:  Diagnosis Date  . Arthritis    "hips, shoulders; knees; back" (10/20/2016)  . Bicuspid aortic valve   . BPH (benign prostatic hypertrophy)   . Carpal tunnel syndrome of right wrist   . Coronary artery disease involving native coronary artery of native heart with unstable angina pectoris ([place]) 10/20/2016  . Diverticulitis   . GERD (gastroesophageal reflux disease)   . Gout   . Heart murmur   . Hyperlipemia   . Hypertension   . Postoperative atrial fibrillation ([place]) 10/24/2016  . RLS (restless legs syndrome)   . S/P aortic valve replacement with bioprosthetic valve 10/22/2016   25 mm [facility] Ease bovine pericardial bioprosthetic  tissue valve  . S/P ascending aortic aneurysm repair 10/22/2016   28 mm supracoronary straight graft replacement of ascending thoracic aortic aneurysm  . S/P CABG x 3 10/22/2016   Sequential LIMA to Diag and LAD, SVG to distal LAD, open vein harvest right thigh  . S/P mitral valve repair 10/22/2016   Artificial Gore-tex neochord  placement x6 - posterior annuloplasty band placed but removed due to systolic anterior motion of mitral valve  . Severe aortic stenosis   . Snores    Never been tested for sleep apnea  . Thoracic ascending aortic aneurysm ([place]) 10/21/2016  . Thrombocytopenia ([place]) 10/22/2016  . Type II diabetes mellitus (H[facility])      Allergies  Allergen Reactions  . Sulfa Antibiotics Hives     Current Outpatient Medications  Medication Sig Dispense Refill  . allopurinol (ZYLOPRIM) 300 MG tablet Take 300 mg by mouth daily.    Marland Kitchen aspirin EC 81 MG tablet Take 81 mg by mouth daily.    Marland Kitchen atorvastatin (LIPITOR) 40 MG tablet TAKE 1 TABLET DAILY AT 6PM 90 tablet 1  . lisinopril (ZESTRIL) 2.5 MG tablet Take 1 tablet (2.5 mg total) by mouth daily. 90 tablet 1  . Magnesium Oxide (MAG-OX 400 PO) Take 400 mg by mouth daily.    . Melatonin 10 MG CAPS Take 10 mg by mouth at bedtime.     . metFORMIN (GLUCOPHAGE) 500 MG tablet Take 500 mg by mouth daily with breakfast. & 1000 mg by mouth in the evening    . metoprolol succinate (TOPROL-XL) 50 MG 24 hr tablet Take 1 tablet (50 mg total) by mouth 2 (two) times daily. 180 tablet 3  . nitroGLYCERIN (NITROSTAT) 0.4 MG SL tablet Place 1 tablet (0.4 mg total) under the tongue every 5 (five) minutes as needed. 25 tablet 3  . potassium chloride SA (KLOR-CON M20) 20 MEQ tablet Take 1 tablet (20 mEq total) by mouth 2 (two) times daily. 180 tablet 1  . rOPINIRole (REQUIP) 2 MG tablet Take 2 mg by mouth See admin instructions. Takes 2mg  at 4:30pm and 2mg  at 9:30pm.    . tamsulosin (FLOMAX) 0.4 MG CAPS capsule Take 0.4 mg by mouth daily.    Marland Kitchen torsemide (DEMADEX) 20 MG tablet TAKE 2 TABLETS IN THE MORNING AND 1 TABLET IN THE EVENING. MAY TAKE ADDITIONAL 1 TABLET AS NEEDED. DOSE DECREASE (NO LONGER ON FUROSEMIDE, 08/18/2019) 300 tablet 2  . warfarin (JANTOVEN) 2 MG tablet TAKE 2 TABLETS DAILY EXCEPT 3 TABLETS ON TUESDAYS OR AS DIRECTED 65 tablet 3   No current facility-administered  medications for this visit.     Past Surgical History:  Procedure Laterality Date  . AMPUTATION Bilateral 12/11/2016   Procedure: AMPUTATION BELOW KNEE BILATERALLY;  Surgeon: [name], MD;  Location: [place];  Service: Orthopedics;  Laterality: Bilateral;  . AMPUTATION Bilateral 12/11/2016   Procedure: AMPUTATION BILATERAL HANDS EXCEPT RIGHT THUMB;  Surgeon: [name], MD;  Location: [place];  Service: Orthopedics;  Laterality: Bilateral;  . AORTIC VALVE REPLACEMENT N/A 10/22/2016   Procedure: AORTIC VALVE REPLACEMENT (AVR) WITH SIZE 25 MM MAGNA EASE PERICARDIAL BIOPROSTHESIS - AORTIC;  Surgeon: Rexene Alberts, MD;  Location: [place];  Service: Open Heart Surgery;  Laterality: N/A;  . BONE EXCISION Right 02/01/2017   Procedure: EXCISION RIGHT INDEX METACARPAL HEAD;  Surgeon: [name], MD;  Location: [place];  Service: Orthopedics;  Laterality: Right;  . CARDIAC CATHETERIZATION N/A 10/20/2016   Procedure: Right/Left Heart Cath  and Coronary Angiography;  Surgeon: Leonie Man, MD;  Location: [place] [facility];  Service: Cardiovascular;  Laterality: N/A;  . CARPAL TUNNEL RELEASE Right 11/28/2013   Procedure: RIGHT WRIST CARPAL TUNNEL RELEASE;  Surgeon: Lorn Junes, MD;  Location: [place];  Service: Orthopedics;  Laterality: Right;  . CATARACT EXTRACTION W/ INTRAOCULAR LENS  IMPLANT, BILATERAL Bilateral 1978  . COLONOSCOPY    . CORONARY ARTERY BYPASS GRAFT N/A 10/22/2016   Procedure: CORONARY ARTERY BYPASS GRAFTING (CABG)x 2 WITH LIMA TO DIAGONAL, OPEN  HARVESTING OF RIGHT SAPHENOUS VEIN FOR VEIN GRAFT TO LAD;  Surgeon: Rexene Alberts, MD;  Location: [place];  Service: Open Heart Surgery;  Laterality: N/A;  . FRACTURE SURGERY    . INGUINAL HERNIA REPAIR Right 1998  . IR GENERIC HISTORICAL  12/07/2016   IR US GUIDE VASC ACCESS RIGHT 12/07/2016 Aletta Edouard, MD [facility]  . IR GENERIC HISTORICAL  12/07/2016   IR RADIOLOGY PERIPHERAL GUIDED IV START 12/07/2016 Aletta Edouard, MD [facility]  . IR GENERIC HISTORICAL  12/07/2016   IR GASTROSTOMY TUBE MOD SED 12/07/2016 Aletta Edouard, MD [facility]  . LIPOMA EXCISION Right 2008   "side of my head"  . MITRAL VALVE REPAIR N/A 10/22/2016   Procedure: MITRAL VALVE REPAIR (MVR) WITH SIZE 30 SORIN ANNULOFLEX ANNULOPLASTY RING WITH SUBSEQUENT REMOVAL OF RING;  Surgeon: Rexene Alberts, MD;  Location: [place];  Service: Open Heart Surgery;  Laterality: N/A;  . PENECTOMY  2007   Peyronie's disease   . PENILE PROSTHESIS IMPLANT  2009  . REMOVAL OF PENILE PROSTHESIS N/A 02/01/2017   Procedure: REMOVAL OF PENILE PROSTHESIS;  Surgeon: Kathie Rhodes, MD;  Location: [place];  Service: Urology;  Laterality: N/A;  . SHOULDER OPEN ROTATOR CUFF REPAIR Right 2006  . STERNAL CLOSURE N/A 10/26/2016   Procedure: STERNAL WASHOUT AND DELAYED PRIMARY CLOSURE;  Surgeon: Rexene Alberts, MD;  Location: [place];  Service: Thoracic;  Laterality: N/A;  . TEE WITHOUT CARDIOVERSION N/A 10/26/2016   Procedure: TRANSESOPHAGEAL ECHOCARDIOGRAM (TEE);  Surgeon: Rexene Alberts, MD;  Location: [place];  Service: Thoracic;  Laterality: N/A;  . TEE WITHOUT CARDIOVERSION N/A 10/22/2016   Procedure: TRANSESOPHAGEAL ECHOCARDIOGRAM (TEE);  Surgeon: Rexene Alberts, MD;  Location: [place];  Service: Open Heart Surgery;  Laterality: N/A;  . THORACIC AORTIC ANEURYSM REPAIR  10/22/2016   Procedure: ASCENDING AORTIC  ANEURYSM REPAIR (AAA) WITH 28 MM HEMASHIELD PLATINUM WOVEN DOUBLE VELOUR VASCULAR GRAFT;  Surgeon: Rexene Alberts, MD;  Location: [place];  Service: Open Heart Surgery;;  . TONSILLECTOMY  ~ 1955  . TRACHEOSTOMY TUBE PLACEMENT N/A 11/09/2016   Procedure: TRACHEOSTOMY;  Surgeon: Rexene Alberts, MD;  Location: [place];  Service: Thoracic;  Laterality: N/A;  . TRANSURETHRAL RESECTION OF PROSTATE  2005  . TRIGGER FINGER RELEASE Bilateral    several lt and rt hands  . TRIGGER FINGER RELEASE Right 11/28/2013   Procedure: RIGHT TRIGGER FINGER  RELEASE (TENDON  SHEATH INCISION);  Surgeon: Lorn Junes, MD;  Location: [place];  Service: Orthopedics;  Laterality: Right;  Marland Kitchen VIDEO BRONCHOSCOPY N/A 11/09/2016   Procedure: VIDEO BRONCHOSCOPY;  Surgeon: Rexene Alberts, MD;  Location: [place];  Service: Thoracic;  Laterality: N/A;  . WRIST FRACTURE SURGERY Right ~ 6553     Allergies  Allergen Reactions  . Sulfa Antibiotics Hives      Family History  Problem Relation Age of Onset  . Lung cancer  Mother   . Clotting disorder Father        No details  . Heart disease Sister 80       Stents  . Cancer Sister        Throat     Social History Mr. Batalla reports that he quit smoking about 37 years ago. His smoking use included pipe and cigars. He quit after 3.00 years of use. He has never used smokeless tobacco. Mr. Terhune reports current alcohol use of about 10.0 standard drinks of alcohol per week.   Review of Systems CONSTITUTIONAL: No weight loss, fever, chills, weakness or fatigue.  HEENT: Eyes: No visual loss, blurred vision, double vision or yellow sclerae.No hearing loss, sneezing, congestion, runny nose or sore throat.  SKIN: No rash or itching.  CARDIOVASCULAR: per hpi RESPIRATORY: No shortness of breath, cough or sputum.  GASTROINTESTINAL: No anorexia, nausea, vomiting or diarrhea. No abdominal pain or blood.  GENITOURINARY: No burning on urination, no polyuria NEUROLOGICAL: No headache, dizziness, syncope, paralysis, ataxia, numbness or tingling in the extremities. No change in bowel or bladder control.  MUSCULOSKELETAL: No muscle, back pain, joint pain or stiffness.  LYMPHATICS: No enlarged nodes. No history of splenectomy.  PSYCHIATRIC: No history of depression or anxiety.  ENDOCRINOLOGIC: No reports of sweating, cold or heat intolerance. No polyuria or polydipsia.  Marland Kitchen   Physical Examination Vitals:   05/08/20 1528  BP: 114/62  Pulse: 69  SpO2: 97%   Filed Weights   05/08/20 1528  Weight: 213 lb 3.2 oz  (96.7 kg)    Gen: resting comfortably, no acute distress HEENT: no scleral icterus, pupils equal round and reactive, no palptable cervical adenopathy,  CV: RRR, no m/r/g,no jvd Resp: Clear to auscultation bilaterally GI: abdomen is soft, non-tender, non-distended, normal bowel sounds, no hepatosplenomegaly MSK: extremities are warm, no edema.  Skin: warm, no rash Neuro:  no focal deficits Psych: appropriate affect   Diagnostic Studies 07/2019 echo 1. The left ventricle has normal systolic function, with an ejection fraction of 55-60%. The cavity size was normal. There is moderately increased left ventricular wall thickness. Left ventricular diastolic Doppler parameters are indeterminate. 2. The right ventricle has mildly reduced systolic function. The cavity was moderately enlarged. There is not assessed. 3. The ventricular septum flattens in systole consistent with RV pressure overload. 4. Left atrial size was severely dilated. 5. Right atrial size was mildly dilated. 6. Mitral valve regurgitation mild to moderate MR, the jet is eccentric and may be underestimated. No evidence of mitral valve stenosis. 7. The aortic valve was not well visualized. No stenosis of the aortic valve. 8. [facility] Ease Pericardial Tissue Valve (size 21mm, model # 3300TFX is in the AV position. 9. The aorta is normal unless otherwise noted. 10. The aortic root is normal in size and structure. 11. Pulmonary hypertension is indeterminate, inadequate TR jet. 12. The inferior vena cava was dilated in size with >50% respiratory variability.    Assessment and Plan  1. Aortic stenosiss/p AVR -stable and normal functioning AVR by recent echo -doing well without symptoms, continue to monitor   2. CAD - s/p CABG. --no symptoms, continue current meds   3.Afib -controlled, continue current meds        Arnoldo Lenis, M.D.

## 2020-05-08 NOTE — Patient Instructions (Signed)
Medication Instructions:  Continue all current medications.   Labwork: none  Testing/Procedures: none  Follow-Up: 6 months   Any Other Special Instructions Will Be Listed Below (If Applicable).   If you need a refill on your cardiac medications before your next appointment, please call your pharmacy.  

## 2020-05-08 NOTE — Patient Instructions (Signed)
Continue warfarin 2 tablets daily except 3 tablets on Tuesdays  Continue Vit K foods Recheck 6 wks

## 2020-05-10 ENCOUNTER — Ambulatory Visit: Payer: Medicare Other | Admitting: Cardiology

## 2020-06-19 ENCOUNTER — Ambulatory Visit (INDEPENDENT_AMBULATORY_CARE_PROVIDER_SITE_OTHER): Payer: Medicare Other | Admitting: *Deleted

## 2020-06-19 DIAGNOSIS — Z5181 Encounter for therapeutic drug level monitoring: Secondary | ICD-10-CM | POA: Diagnosis not present

## 2020-06-19 DIAGNOSIS — I4891 Unspecified atrial fibrillation: Secondary | ICD-10-CM

## 2020-06-19 DIAGNOSIS — I9789 Other postprocedural complications and disorders of the circulatory system, not elsewhere classified: Secondary | ICD-10-CM | POA: Diagnosis not present

## 2020-06-19 DIAGNOSIS — Z9889 Other specified postprocedural states: Secondary | ICD-10-CM

## 2020-06-19 DIAGNOSIS — Q231 Congenital insufficiency of aortic valve: Secondary | ICD-10-CM | POA: Diagnosis not present

## 2020-06-19 DIAGNOSIS — I82603 Acute embolism and thrombosis of unspecified veins of upper extremity, bilateral: Secondary | ICD-10-CM | POA: Diagnosis not present

## 2020-06-19 DIAGNOSIS — Q2381 Bicuspid aortic valve: Secondary | ICD-10-CM

## 2020-06-19 LAB — POCT INR: INR: 2.1 (ref 2.0–3.0)

## 2020-06-19 NOTE — Patient Instructions (Signed)
Continue warfarin 2 tablets daily except 3 tablets on Tuesdays  Pending 2 dental extractions.  Not scheduled yet.  Will hold warfarin 2 days prior to extractions and resume night of .  Note faxed back to Dr Kathaleen Grinder office with OK to hold warfarin. Continue Vit K foods Recheck 6 wks

## 2020-06-21 ENCOUNTER — Other Ambulatory Visit: Payer: Self-pay | Admitting: Cardiology

## 2020-06-21 DIAGNOSIS — E1142 Type 2 diabetes mellitus with diabetic polyneuropathy: Secondary | ICD-10-CM | POA: Diagnosis not present

## 2020-06-21 DIAGNOSIS — Z7984 Long term (current) use of oral hypoglycemic drugs: Secondary | ICD-10-CM | POA: Diagnosis not present

## 2020-06-21 DIAGNOSIS — I482 Chronic atrial fibrillation, unspecified: Secondary | ICD-10-CM | POA: Diagnosis not present

## 2020-06-21 DIAGNOSIS — I10 Essential (primary) hypertension: Secondary | ICD-10-CM | POA: Diagnosis not present

## 2020-07-05 DIAGNOSIS — K2100 Gastro-esophageal reflux disease with esophagitis, without bleeding: Secondary | ICD-10-CM | POA: Diagnosis not present

## 2020-07-05 DIAGNOSIS — E7800 Pure hypercholesterolemia, unspecified: Secondary | ICD-10-CM | POA: Diagnosis not present

## 2020-07-05 DIAGNOSIS — E1159 Type 2 diabetes mellitus with other circulatory complications: Secondary | ICD-10-CM | POA: Diagnosis not present

## 2020-07-05 DIAGNOSIS — E1142 Type 2 diabetes mellitus with diabetic polyneuropathy: Secondary | ICD-10-CM | POA: Diagnosis not present

## 2020-07-05 DIAGNOSIS — I10 Essential (primary) hypertension: Secondary | ICD-10-CM | POA: Diagnosis not present

## 2020-07-05 DIAGNOSIS — E782 Mixed hyperlipidemia: Secondary | ICD-10-CM | POA: Diagnosis not present

## 2020-07-05 DIAGNOSIS — R197 Diarrhea, unspecified: Secondary | ICD-10-CM | POA: Diagnosis not present

## 2020-07-10 DIAGNOSIS — E782 Mixed hyperlipidemia: Secondary | ICD-10-CM | POA: Diagnosis not present

## 2020-07-10 DIAGNOSIS — N401 Enlarged prostate with lower urinary tract symptoms: Secondary | ICD-10-CM | POA: Diagnosis not present

## 2020-07-10 DIAGNOSIS — I10 Essential (primary) hypertension: Secondary | ICD-10-CM | POA: Diagnosis not present

## 2020-07-10 DIAGNOSIS — Z6830 Body mass index (BMI) 30.0-30.9, adult: Secondary | ICD-10-CM | POA: Diagnosis not present

## 2020-07-10 DIAGNOSIS — M1A9XX0 Chronic gout, unspecified, without tophus (tophi): Secondary | ICD-10-CM | POA: Diagnosis not present

## 2020-07-10 DIAGNOSIS — R197 Diarrhea, unspecified: Secondary | ICD-10-CM | POA: Diagnosis not present

## 2020-07-10 DIAGNOSIS — E1140 Type 2 diabetes mellitus with diabetic neuropathy, unspecified: Secondary | ICD-10-CM | POA: Diagnosis not present

## 2020-07-10 DIAGNOSIS — G2581 Restless legs syndrome: Secondary | ICD-10-CM | POA: Diagnosis not present

## 2020-07-23 DIAGNOSIS — E1142 Type 2 diabetes mellitus with diabetic polyneuropathy: Secondary | ICD-10-CM | POA: Diagnosis not present

## 2020-07-23 DIAGNOSIS — M25552 Pain in left hip: Secondary | ICD-10-CM | POA: Diagnosis not present

## 2020-07-23 DIAGNOSIS — I482 Chronic atrial fibrillation, unspecified: Secondary | ICD-10-CM | POA: Diagnosis not present

## 2020-07-23 DIAGNOSIS — I10 Essential (primary) hypertension: Secondary | ICD-10-CM | POA: Diagnosis not present

## 2020-07-23 DIAGNOSIS — Z7984 Long term (current) use of oral hypoglycemic drugs: Secondary | ICD-10-CM | POA: Diagnosis not present

## 2020-07-23 DIAGNOSIS — M1612 Unilateral primary osteoarthritis, left hip: Secondary | ICD-10-CM | POA: Diagnosis not present

## 2020-07-28 ENCOUNTER — Other Ambulatory Visit: Payer: Self-pay | Admitting: Cardiology

## 2020-07-30 ENCOUNTER — Telehealth: Payer: Self-pay | Admitting: Cardiology

## 2020-07-30 MED ORDER — WARFARIN SODIUM 2 MG PO TABS: ORAL_TABLET | ORAL | 0 refills | Status: DC

## 2020-07-30 NOTE — Telephone Encounter (Signed)
10 tablets of warfarin have been sent to local [facility] per pt request.

## 2020-07-30 NOTE — Telephone Encounter (Signed)
New message    *STAT* If patient is at the pharmacy, call can be transferred to refill team.   1. Which medications need to be refilled? (please list name of each medication and dose if known) warafrin   2. Which pharmacy/location (including street and city if local pharmacy) is medication to be sent to?  [facility] [place]  3. Do they need a 30 day or 90 day supply?  10 pills because mail order has not arrived yet  -  Please let pharmacy know pt will pay out of pocket

## 2020-08-02 ENCOUNTER — Other Ambulatory Visit: Payer: Self-pay | Admitting: *Deleted

## 2020-08-02 MED ORDER — WARFARIN SODIUM 2 MG PO TABS: ORAL_TABLET | ORAL | 0 refills | Status: DC

## 2020-08-05 DIAGNOSIS — Z23 Encounter for immunization: Secondary | ICD-10-CM | POA: Diagnosis not present

## 2020-08-09 ENCOUNTER — Ambulatory Visit (INDEPENDENT_AMBULATORY_CARE_PROVIDER_SITE_OTHER): Payer: Medicare Other | Admitting: *Deleted

## 2020-08-09 DIAGNOSIS — I82603 Acute embolism and thrombosis of unspecified veins of upper extremity, bilateral: Secondary | ICD-10-CM

## 2020-08-09 DIAGNOSIS — Z5181 Encounter for therapeutic drug level monitoring: Secondary | ICD-10-CM

## 2020-08-09 DIAGNOSIS — I4891 Unspecified atrial fibrillation: Secondary | ICD-10-CM

## 2020-08-09 DIAGNOSIS — Q231 Congenital insufficiency of aortic valve: Secondary | ICD-10-CM | POA: Diagnosis not present

## 2020-08-09 DIAGNOSIS — Z9889 Other specified postprocedural states: Secondary | ICD-10-CM | POA: Diagnosis not present

## 2020-08-09 DIAGNOSIS — I9789 Other postprocedural complications and disorders of the circulatory system, not elsewhere classified: Secondary | ICD-10-CM | POA: Diagnosis not present

## 2020-08-09 LAB — POCT INR: INR: 3.5 — AB (ref 2.0–3.0)

## 2020-08-09 NOTE — Patient Instructions (Signed)
Hold warfarin tonight then resume 2 tablets daily except 3 tablets on Tuesdays  Continue Vit K foods Recheck 4 wks

## 2020-08-22 DIAGNOSIS — I10 Essential (primary) hypertension: Secondary | ICD-10-CM | POA: Diagnosis not present

## 2020-08-22 DIAGNOSIS — Z7984 Long term (current) use of oral hypoglycemic drugs: Secondary | ICD-10-CM | POA: Diagnosis not present

## 2020-08-22 DIAGNOSIS — E1142 Type 2 diabetes mellitus with diabetic polyneuropathy: Secondary | ICD-10-CM | POA: Diagnosis not present

## 2020-08-27 ENCOUNTER — Other Ambulatory Visit: Payer: Self-pay | Admitting: Cardiology

## 2020-09-05 ENCOUNTER — Ambulatory Visit (INDEPENDENT_AMBULATORY_CARE_PROVIDER_SITE_OTHER): Payer: Medicare Other | Admitting: *Deleted

## 2020-09-05 ENCOUNTER — Other Ambulatory Visit: Payer: Self-pay

## 2020-09-05 DIAGNOSIS — Z953 Presence of xenogenic heart valve: Secondary | ICD-10-CM | POA: Diagnosis not present

## 2020-09-05 DIAGNOSIS — Q231 Congenital insufficiency of aortic valve: Secondary | ICD-10-CM

## 2020-09-05 DIAGNOSIS — I82603 Acute embolism and thrombosis of unspecified veins of upper extremity, bilateral: Secondary | ICD-10-CM

## 2020-09-05 DIAGNOSIS — Z9889 Other specified postprocedural states: Secondary | ICD-10-CM | POA: Diagnosis not present

## 2020-09-05 DIAGNOSIS — Z5181 Encounter for therapeutic drug level monitoring: Secondary | ICD-10-CM | POA: Diagnosis not present

## 2020-09-05 DIAGNOSIS — I4891 Unspecified atrial fibrillation: Secondary | ICD-10-CM

## 2020-09-05 DIAGNOSIS — I9789 Other postprocedural complications and disorders of the circulatory system, not elsewhere classified: Secondary | ICD-10-CM | POA: Diagnosis not present

## 2020-09-05 LAB — POCT INR: INR: 3.4 — AB (ref 2.0–3.0)

## 2020-09-05 NOTE — Patient Instructions (Signed)
Hold warfarin tonight then decrease dose to 2 tablets daily  Continue Vit K foods Recheck 4 wks

## 2020-10-05 ENCOUNTER — Other Ambulatory Visit: Payer: Self-pay | Admitting: Cardiology

## 2020-10-07 ENCOUNTER — Ambulatory Visit (INDEPENDENT_AMBULATORY_CARE_PROVIDER_SITE_OTHER): Payer: Medicare Other | Admitting: *Deleted

## 2020-10-07 DIAGNOSIS — Z953 Presence of xenogenic heart valve: Secondary | ICD-10-CM | POA: Diagnosis not present

## 2020-10-07 DIAGNOSIS — I82603 Acute embolism and thrombosis of unspecified veins of upper extremity, bilateral: Secondary | ICD-10-CM

## 2020-10-07 DIAGNOSIS — I9789 Other postprocedural complications and disorders of the circulatory system, not elsewhere classified: Secondary | ICD-10-CM | POA: Diagnosis not present

## 2020-10-07 DIAGNOSIS — I4891 Unspecified atrial fibrillation: Secondary | ICD-10-CM

## 2020-10-07 DIAGNOSIS — Z9889 Other specified postprocedural states: Secondary | ICD-10-CM | POA: Diagnosis not present

## 2020-10-07 DIAGNOSIS — Z5181 Encounter for therapeutic drug level monitoring: Secondary | ICD-10-CM | POA: Diagnosis not present

## 2020-10-07 DIAGNOSIS — Q231 Congenital insufficiency of aortic valve: Secondary | ICD-10-CM | POA: Diagnosis not present

## 2020-10-07 LAB — POCT INR: INR: 2.8 (ref 2.0–3.0)

## 2020-10-07 NOTE — Patient Instructions (Signed)
Continue warfarin 2 tablets daily  Continue Vit K foods Recheck 4 wks

## 2020-10-22 DIAGNOSIS — I10 Essential (primary) hypertension: Secondary | ICD-10-CM | POA: Diagnosis not present

## 2020-10-22 DIAGNOSIS — Z7984 Long term (current) use of oral hypoglycemic drugs: Secondary | ICD-10-CM | POA: Diagnosis not present

## 2020-10-22 DIAGNOSIS — E1142 Type 2 diabetes mellitus with diabetic polyneuropathy: Secondary | ICD-10-CM | POA: Diagnosis not present

## 2020-10-25 ENCOUNTER — Ambulatory Visit: Payer: Medicare Other | Admitting: Cardiology

## 2020-11-05 ENCOUNTER — Ambulatory Visit (INDEPENDENT_AMBULATORY_CARE_PROVIDER_SITE_OTHER): Payer: Medicare Other | Admitting: *Deleted

## 2020-11-05 DIAGNOSIS — Q231 Congenital insufficiency of aortic valve: Secondary | ICD-10-CM

## 2020-11-05 DIAGNOSIS — I82603 Acute embolism and thrombosis of unspecified veins of upper extremity, bilateral: Secondary | ICD-10-CM

## 2020-11-05 DIAGNOSIS — Z9889 Other specified postprocedural states: Secondary | ICD-10-CM | POA: Diagnosis not present

## 2020-11-05 DIAGNOSIS — Z5181 Encounter for therapeutic drug level monitoring: Secondary | ICD-10-CM

## 2020-11-05 DIAGNOSIS — I9789 Other postprocedural complications and disorders of the circulatory system, not elsewhere classified: Secondary | ICD-10-CM | POA: Diagnosis not present

## 2020-11-05 DIAGNOSIS — I4891 Unspecified atrial fibrillation: Secondary | ICD-10-CM | POA: Diagnosis not present

## 2020-11-05 DIAGNOSIS — Z23 Encounter for immunization: Secondary | ICD-10-CM | POA: Diagnosis not present

## 2020-11-05 LAB — POCT INR: INR: 2.5 (ref 2.0–3.0)

## 2020-11-05 NOTE — Patient Instructions (Signed)
Continue warfarin 2 tablets daily  Continue Vit K foods Recheck 5 wks

## 2020-11-06 DIAGNOSIS — E782 Mixed hyperlipidemia: Secondary | ICD-10-CM | POA: Diagnosis not present

## 2020-11-06 DIAGNOSIS — I10 Essential (primary) hypertension: Secondary | ICD-10-CM | POA: Diagnosis not present

## 2020-11-06 DIAGNOSIS — E1142 Type 2 diabetes mellitus with diabetic polyneuropathy: Secondary | ICD-10-CM | POA: Diagnosis not present

## 2020-11-06 DIAGNOSIS — E1121 Type 2 diabetes mellitus with diabetic nephropathy: Secondary | ICD-10-CM | POA: Diagnosis not present

## 2020-11-06 DIAGNOSIS — K2100 Gastro-esophageal reflux disease with esophagitis, without bleeding: Secondary | ICD-10-CM | POA: Diagnosis not present

## 2020-11-06 DIAGNOSIS — E7801 Familial hypercholesterolemia: Secondary | ICD-10-CM | POA: Diagnosis not present

## 2020-11-06 DIAGNOSIS — E1151 Type 2 diabetes mellitus with diabetic peripheral angiopathy without gangrene: Secondary | ICD-10-CM | POA: Diagnosis not present

## 2020-11-11 ENCOUNTER — Other Ambulatory Visit: Payer: Self-pay

## 2020-11-11 DIAGNOSIS — Z6831 Body mass index (BMI) 31.0-31.9, adult: Secondary | ICD-10-CM | POA: Diagnosis not present

## 2020-11-11 DIAGNOSIS — E1140 Type 2 diabetes mellitus with diabetic neuropathy, unspecified: Secondary | ICD-10-CM | POA: Diagnosis not present

## 2020-11-11 DIAGNOSIS — M1A9XX0 Chronic gout, unspecified, without tophus (tophi): Secondary | ICD-10-CM | POA: Diagnosis not present

## 2020-11-11 DIAGNOSIS — N401 Enlarged prostate with lower urinary tract symptoms: Secondary | ICD-10-CM | POA: Diagnosis not present

## 2020-11-11 DIAGNOSIS — E782 Mixed hyperlipidemia: Secondary | ICD-10-CM | POA: Diagnosis not present

## 2020-11-11 DIAGNOSIS — G2581 Restless legs syndrome: Secondary | ICD-10-CM | POA: Diagnosis not present

## 2020-11-11 DIAGNOSIS — I10 Essential (primary) hypertension: Secondary | ICD-10-CM | POA: Diagnosis not present

## 2020-11-11 DIAGNOSIS — R197 Diarrhea, unspecified: Secondary | ICD-10-CM | POA: Diagnosis not present

## 2020-11-22 DIAGNOSIS — E1142 Type 2 diabetes mellitus with diabetic polyneuropathy: Secondary | ICD-10-CM | POA: Diagnosis not present

## 2020-11-22 DIAGNOSIS — Z7984 Long term (current) use of oral hypoglycemic drugs: Secondary | ICD-10-CM | POA: Diagnosis not present

## 2020-11-22 DIAGNOSIS — I10 Essential (primary) hypertension: Secondary | ICD-10-CM | POA: Diagnosis not present

## 2020-11-27 ENCOUNTER — Ambulatory Visit (INDEPENDENT_AMBULATORY_CARE_PROVIDER_SITE_OTHER): Payer: Medicare Other | Admitting: Cardiology

## 2020-11-27 ENCOUNTER — Encounter: Payer: Self-pay | Admitting: Cardiology

## 2020-11-27 VITALS — BP 110/58 | HR 71 | Resp 16 | Ht 69.0 in | Wt 212.8 lb

## 2020-11-27 DIAGNOSIS — Z953 Presence of xenogenic heart valve: Secondary | ICD-10-CM

## 2020-11-27 DIAGNOSIS — I2510 Atherosclerotic heart disease of native coronary artery without angina pectoris: Secondary | ICD-10-CM

## 2020-11-27 DIAGNOSIS — I4891 Unspecified atrial fibrillation: Secondary | ICD-10-CM | POA: Diagnosis not present

## 2020-11-27 DIAGNOSIS — I25110 Atherosclerotic heart disease of native coronary artery with unstable angina pectoris: Secondary | ICD-10-CM

## 2020-11-27 NOTE — Progress Notes (Signed)
Clinical Summary Scott [name] is a 75 y.o.male seen today for follow up of the following medical problems.  1. Aortic stenosis - history of bicuspid AV - history of AVR11/30/2017, [facility] Ease Pericardial Tissue Valve (size 32mm, model # 3300TFX, serial # [id number]) along with 3 vessel CABG, MV repair, and repair of ascending aortic aneurysm   - OR on 10/22/16 for CABG X 3, AVR, MVR, repair of thoracic ascending aneurysm with placement of VAC on open chest Post op course significant for cardiogenic shock requiring multiple pressors, SAM requiring removal of mitral annuloplasty ring, persistent fevers, encephalopathy, shocked liver, DIC due to consumptive intraoperative coagulopathy, diffuse clotting with ischemia of distal BUE/BLE extremities, A fib, C diff colitis, volume overload and VDRF with difficulty with vent wean requiring tracheostomy. Sternal wound closed on 12/4 and was started on coumadin for RV thrombus and A fib.   [facility] course significant for MRSA bacteremia, pneumonitis, intermittent fevers, dysphagia requiring PEG tube placement on 1/15 by Dr. Kathlene Cote. He developed progressive gangrenous changes of BUE and BLE requiring B-transtibial amputation , left transmetacarpal amputation and Right MCP amputation of right hand by Dr. Sharol Given on 1/19.   01/2017 echo: 55-60%, no WMAs, grade II diastolic dysfunction. Normal functioning AV.   07/2019 echo: LVEF 55-60%, mild RV dysfunction, mild to mod MR - no recent SOB or DOE   - no recent SOB/DOE, no recent edema - compliant with meds    2. Mitral regurgitation - patient had MV ring placed during his surgery 11/30/17along with AVR, ascending thoracic aneurysm repair, and CABG.  - ring was subsequently removed during that operation due to significant SAM - 02/15/17 echo shows only mild MR    3. CAD - history of CABG 10/22/2017 (LIMA-Diag and distal LAD, SVG-LAD) 01/2017 echo: 55-60%, no WMAs, grade II  diastolic dysfunction. Normal functioning AV.    -no recent chest pain.   4. Ischemic limbs - occurred in setting of cardiogenic shock requiring high dose pressors as well as DIC - required amputation - followed by ortho.  5.Afib/aflutter - initally had post op afib  - noted to be in aflutter at clinical f/uand thus was committed to long term anticoagulation 08/2019 monitor no significant arrhytmias  - occasional palpitations, lasts just a few seconds.  - no bleeding on coumadin.        SH: completed covid vaccine x 3     Past Medical History:  Diagnosis Date  . Arthritis    "hips, shoulders; knees; back" (10/20/2016)  . Bicuspid aortic valve   . BPH (benign prostatic hypertrophy)   . Carpal tunnel syndrome of right wrist   . Coronary artery disease involving native coronary artery of native heart with unstable angina pectoris ([place]) 10/20/2016  . Diverticulitis   . GERD (gastroesophageal reflux disease)   . Gout   . Heart murmur   . Hyperlipemia   . Hypertension   . Postoperative atrial fibrillation ([place]) 10/24/2016  . RLS (restless legs syndrome)   . S/P aortic valve replacement with bioprosthetic valve 10/22/2016   25 mm [facility] Ease bovine pericardial bioprosthetic tissue valve  . S/P ascending aortic aneurysm repair 10/22/2016   28 mm supracoronary straight graft replacement of ascending thoracic aortic aneurysm  . S/P CABG x 3 10/22/2016   Sequential LIMA to Diag and LAD, SVG to distal LAD, open vein harvest right thigh  . S/P mitral valve repair 10/22/2016   Artificial Gore-tex neochord placement x6 - posterior annuloplasty band  placed but removed due to systolic anterior motion of mitral valve  . Severe aortic stenosis   . Snores    Never been tested for sleep apnea  . Thoracic ascending aortic aneurysm ([place]) 10/21/2016  . Thrombocytopenia ([place]) 10/22/2016  . Type II diabetes mellitus (HCC)      Allergies  Allergen Reactions  .  Sulfa Antibiotics Hives     Current Outpatient Medications  Medication Sig Dispense Refill  . allopurinol (ZYLOPRIM) 300 MG tablet Take 300 mg by mouth daily.    Marland Kitchen aspirin EC 81 MG tablet Take 81 mg by mouth daily.    Marland Kitchen atorvastatin (LIPITOR) 40 MG tablet TAKE 1 TABLET DAILY AT 6PM 90 tablet 3  . KLOR-CON M20 20 MEQ tablet TAKE 1 TABLET TWICE A DAY 180 tablet 3  . lisinopril (ZESTRIL) 2.5 MG tablet TAKE 1 TABLET DAILY 90 tablet 3  . Magnesium Oxide (MAG-OX 400 PO) Take 400 mg by mouth daily.    . Melatonin 10 MG CAPS Take 10 mg by mouth at bedtime.     . metFORMIN (GLUCOPHAGE) 500 MG tablet Take 500 mg by mouth daily with breakfast. & 1000 mg by mouth in the evening    . metoprolol succinate (TOPROL-XL) 50 MG 24 hr tablet Take 1 tablet (50 mg total) by mouth 2 (two) times daily. 180 tablet 3  . nitroGLYCERIN (NITROSTAT) 0.4 MG SL tablet Place 1 tablet (0.4 mg total) under the tongue every 5 (five) minutes as needed. 25 tablet 3  . rOPINIRole (REQUIP) 2 MG tablet Take 2 mg by mouth See admin instructions. Takes 2mg  at 4:30pm and 2mg  at 9:30pm.    . tamsulosin (FLOMAX) 0.4 MG CAPS capsule Take 0.4 mg by mouth daily.    Marland Kitchen torsemide (DEMADEX) 20 MG tablet TAKE 2 TABLETS EVERY MORNING AND 1 TABLET EVERY  EVENING. MAY TAKE ADDITIONAL 1 TABLET AS NEEDED. DOSE DECREASE (NO LONGER ON FUROSEMIDE 08/18/2019). 270 tablet 2  . warfarin (COUMADIN) 2 MG tablet TAKE 2 TABLETS BY MOUTH DAILY EXCEPT TAKE 3 TABLETS ON TUESDAYS OR AS DIRECTED 64 tablet 4   No current facility-administered medications for this visit.     Past Surgical History:  Procedure Laterality Date  . AMPUTATION Bilateral 12/11/2016   Procedure: AMPUTATION BELOW KNEE BILATERALLY;  Surgeon: [name], MD;  Location: [place];  Service: Orthopedics;  Laterality: Bilateral;  . AMPUTATION Bilateral 12/11/2016   Procedure: AMPUTATION BILATERAL HANDS EXCEPT RIGHT THUMB;  Surgeon: [name], MD;  Location: [place];  Service: Orthopedics;   Laterality: Bilateral;  . AORTIC VALVE REPLACEMENT N/A 10/22/2016   Procedure: AORTIC VALVE REPLACEMENT (AVR) WITH SIZE 25 MM MAGNA EASE PERICARDIAL BIOPROSTHESIS - AORTIC;  Surgeon: Rexene Alberts, MD;  Location: [place];  Service: Open Heart Surgery;  Laterality: N/A;  . BONE EXCISION Right 02/01/2017   Procedure: EXCISION RIGHT INDEX METACARPAL HEAD;  Surgeon: [name], MD;  Location: [place];  Service: Orthopedics;  Laterality: Right;  . CARDIAC CATHETERIZATION N/A 10/20/2016   Procedure: Right/Left Heart Cath and Coronary Angiography;  Surgeon: Leonie Man, MD;  Location: [place] [facility];  Service: Cardiovascular;  Laterality: N/A;  . CARPAL TUNNEL RELEASE Right 11/28/2013   Procedure: RIGHT WRIST CARPAL TUNNEL RELEASE;  Surgeon: Lorn Junes, MD;  Location: [place];  Service: Orthopedics;  Laterality: Right;  . CATARACT EXTRACTION W/ INTRAOCULAR LENS  IMPLANT, BILATERAL Bilateral 1978  . COLONOSCOPY    . CORONARY ARTERY BYPASS GRAFT  N/A 10/22/2016   Procedure: CORONARY ARTERY BYPASS GRAFTING (CABG)x 2 WITH LIMA TO DIAGONAL, OPEN  HARVESTING OF RIGHT SAPHENOUS VEIN FOR VEIN GRAFT TO LAD;  Surgeon: Purcell Nails, MD;  Location: [facility];  Service: Open Heart Surgery;  Laterality: N/A;  . FRACTURE SURGERY    . INGUINAL HERNIA REPAIR Right 1998  . IR GENERIC HISTORICAL  12/07/2016   IR US GUIDE VASC ACCESS RIGHT 12/07/2016 Irish Lack, MD [facility]  . IR GENERIC HISTORICAL  12/07/2016   IR RADIOLOGY PERIPHERAL GUIDED IV START 12/07/2016 Irish Lack, MD [facility]  . IR GENERIC HISTORICAL  12/07/2016   IR GASTROSTOMY TUBE MOD SED 12/07/2016 Irish Lack, MD [facility]  . LIPOMA EXCISION Right 2008   "side of my head"  . MITRAL VALVE REPAIR N/A 10/22/2016   Procedure: MITRAL VALVE REPAIR (MVR) WITH SIZE 30 SORIN ANNULOFLEX ANNULOPLASTY RING WITH SUBSEQUENT REMOVAL OF RING;  Surgeon: Purcell Nails, MD;  Location: [facility];  Service: Open Heart Surgery;   Laterality: N/A;  . PENECTOMY  2007   Peyronie's disease   . PENILE PROSTHESIS IMPLANT  2009  . REMOVAL OF PENILE PROSTHESIS N/A 02/01/2017   Procedure: REMOVAL OF PENILE PROSTHESIS;  Surgeon: Ihor Gully, MD;  Location: [facility] OR;  Service: Urology;  Laterality: N/A;  . SHOULDER OPEN ROTATOR CUFF REPAIR Right 2006  . STERNAL CLOSURE N/A 10/26/2016   Procedure: STERNAL WASHOUT AND DELAYED PRIMARY CLOSURE;  Surgeon: Purcell Nails, MD;  Location[facility];  Service: Thoracic;  Laterality: N/A;  . TEE WITHOUT CARDIOVERSION N/A 10/26/2016   Procedure: TRANSESOPHAGEAL ECHOCARDIOGRAM (TEE);  Surgeon: Purcell Nails, MD;  Location: [facility] OR;  Service: Thoracic;  Laterality: N/A;  . TEE WITHOUT CARDIOVERSION N/A 10/22/2016   Procedure: TRANSESOPHAGEAL ECHOCARDIOGRAM (TEE);  Surgeon: Purcell Nails, MD;  Location: [facility] OR;  Service: Open Heart Surgery;  Laterality: N/A;  . THORACIC AORTIC ANEURYSM REPAIR  10/22/2016   Procedure: ASCENDING AORTIC  ANEURYSM REPAIR (AAA) WITH 28 MM HEMASHIELD PLATINUM WOVEN DOUBLE VELOUR VASCULAR GRAFT;  Surgeon: Purcell Nails, MD;  Location: [facility];  Service: Open Heart Surgery;;  . TONSILLECTOMY  ~ 1955  . TRACHEOSTOMY TUBE PLACEMENT N/A 11/09/2016   Procedure: TRACHEOSTOMY;  Surgeon: Purcell Nails, MD;  Location: [facility] OR;  Service: Thoracic;  Laterality: N/A;  . TRANSURETHRAL RESECTION OF PROSTATE  2005  . TRIGGER FINGER RELEASE Bilateral    several lt and rt hands  . TRIGGER FINGER RELEASE Right 11/28/2013   Procedure: RIGHT TRIGGER FINGER  RELEASE (TENDON SHEATH INCISION);  Surgeon: Nilda Simmer, MD;  Location: [facility];  Service: Orthopedics;  Laterality: Right;  Marland Kitchen VIDEO BRONCHOSCOPY N/A 11/09/2016   Procedure: VIDEO BRONCHOSCOPY;  Surgeon: Purcell Nails, MD;  Location: [facility] OR;  Service: Thoracic;  Laterality: N/A;  . WRIST FRACTURE SURGERY Right ~ 7782     Allergies  Allergen Reactions  . Sulfa Antibiotics Hives      Family History  Problem  Relation Age of Onset  . Lung cancer Mother   . Clotting disorder Father        No details  . Heart disease Sister 50       Stents  . Cancer Sister        Throat     Social History Mr. Barga reports that he quit smoking about 38 years ago. His smoking use included pipe and cigars. He quit after 3.00 years of use. He has never used smokeless  tobacco. Mr. Cibelli reports current alcohol use of about 10.0 standard drinks of alcohol per week.   Review of Systems CONSTITUTIONAL: No weight loss, fever, chills, weakness or fatigue.  HEENT: Eyes: No visual loss, blurred vision, double vision or yellow sclerae.No hearing loss, sneezing, congestion, runny nose or sore throat.  SKIN: No rash or itching.  CARDIOVASCULAR: per hpi RESPIRATORY: No shortness of breath, cough or sputum.  GASTROINTESTINAL: No anorexia, nausea, vomiting or diarrhea. No abdominal pain or blood.  GENITOURINARY: No burning on urination, no polyuria NEUROLOGICAL: No headache, dizziness, syncope, paralysis, ataxia, numbness or tingling in the extremities. No change in bowel or bladder control.  MUSCULOSKELETAL: No muscle, back pain, joint pain or stiffness.  LYMPHATICS: No enlarged nodes. No history of splenectomy.  PSYCHIATRIC: No history of depression or anxiety.  ENDOCRINOLOGIC: No reports of sweating, cold or heat intolerance. No polyuria or polydipsia.  Marland Kitchen   Physical Examination Today's Vitals   11/27/20 1430  BP: (!) 110/58  Pulse: 71  Resp: 16  Weight: 212 lb 12.8 oz (96.5 kg)  Height: 5\' 9"  (1.753 m)   Body mass index is 31.43 kg/m.  Gen: resting comfortably, no acute distress HEENT: no scleral icterus, pupils equal round and reactive, no palptable cervical adenopathy,  CV: RRR, 2/6 systolic murmur, no jvd Resp: Clear to auscultation bilaterally GI: abdomen is soft, non-tender, non-distended, normal bowel sounds, no hepatosplenomegaly MSK: extremities are warm, no edema.  Skin: warm, no rash Neuro:   no focal deficits Psych: appropriate affect   Diagnostic Studies  07/2019 echo 1. The left ventricle has normal systolic function, with an ejection fraction of 55-60%. The cavity size was normal. There is moderately increased left ventricular wall thickness. Left ventricular diastolic Doppler parameters are indeterminate. 2. The right ventricle has mildly reduced systolic function. The cavity was moderately enlarged. There is not assessed. 3. The ventricular septum flattens in systole consistent with RV pressure overload. 4. Left atrial size was severely dilated. 5. Right atrial size was mildly dilated. 6. Mitral valve regurgitation mild to moderate MR, the jet is eccentric and may be underestimated. No evidence of mitral valve stenosis. 7. The aortic valve was not well visualized. No stenosis of the aortic valve. 8. [facility] Ease Pericardial Tissue Valve (size 67mm, model # 3300TFX is in the AV position. 9. The aorta is normal unless otherwise noted. 10. The aortic root is normal in size and structure. 11. Pulmonary hypertension is indeterminate, inadequate TR jet. 12. The inferior vena cava was dilated in size with >50% respiratory variability.   Assessment and Plan  1. Aortic stenosiss/p AVR - no symptom, continue to monitor.  2. CAD - no recent symptoms, continue current meds   3.Afib -infrequent mild palpitations - EKG today shows SR - continue current meds, coumadin for anticoag.      Arnoldo Lenis, M.D.

## 2020-11-27 NOTE — Patient Instructions (Signed)
Medication Instructions:  Your physician recommends that you continue on your current medications as directed. Please refer to the Current Medication list given to you today.  *If you need a refill on your cardiac medications before your next appointment, please call your pharmacy*   Lab Work: None today If you have labs (blood work) drawn today and your tests are completely normal, you will receive your results only by: . MyChart Message (if you have MyChart) OR . A paper copy in the mail If you have any lab test that is abnormal or we need to change your treatment, we will call you to review the results.   Testing/Procedures: None today   Follow-Up: At [facility], you and your health needs are our priority.  As part of our continuing mission to provide you with exceptional heart care, we have created designated Provider Care Teams.  These Care Teams include your primary Cardiologist (physician) and Advanced Practice Providers (APPs -  Physician Assistants and Nurse Practitioners) who all work together to provide you with the care you need, when you need it.  We recommend signing up for the patient portal called "MyChart".  Sign up information is provided on this After Visit Summary.  MyChart is used to connect with patients for Virtual Visits (Telemedicine).  Patients are able to view lab/test results, encounter notes, upcoming appointments, etc.  Non-urgent messages can be sent to your provider as well.   To learn more about what you can do with MyChart, go to [email or web address].    Your next appointment:   6 month(s)  The format for your next appointment:   In Person  Provider:   Jonathan Branch, MD   Other Instructions None       Thank you for choosing [facility] !         

## 2020-12-13 ENCOUNTER — Other Ambulatory Visit: Payer: Self-pay

## 2020-12-13 ENCOUNTER — Ambulatory Visit (INDEPENDENT_AMBULATORY_CARE_PROVIDER_SITE_OTHER): Payer: Medicare Other | Admitting: *Deleted

## 2020-12-13 DIAGNOSIS — I9789 Other postprocedural complications and disorders of the circulatory system, not elsewhere classified: Secondary | ICD-10-CM

## 2020-12-13 DIAGNOSIS — I82603 Acute embolism and thrombosis of unspecified veins of upper extremity, bilateral: Secondary | ICD-10-CM

## 2020-12-13 DIAGNOSIS — I4891 Unspecified atrial fibrillation: Secondary | ICD-10-CM | POA: Diagnosis not present

## 2020-12-13 DIAGNOSIS — Q231 Congenital insufficiency of aortic valve: Secondary | ICD-10-CM

## 2020-12-13 DIAGNOSIS — Z9889 Other specified postprocedural states: Secondary | ICD-10-CM | POA: Diagnosis not present

## 2020-12-13 DIAGNOSIS — Z5181 Encounter for therapeutic drug level monitoring: Secondary | ICD-10-CM | POA: Diagnosis not present

## 2020-12-13 LAB — POCT INR: INR: 2.2 (ref 2.0–3.0)

## 2020-12-13 NOTE — Patient Instructions (Signed)
Continue warfarin 2 tablets daily  Continue Vit K foods Recheck 6 wks

## 2020-12-21 DIAGNOSIS — Z7984 Long term (current) use of oral hypoglycemic drugs: Secondary | ICD-10-CM | POA: Diagnosis not present

## 2020-12-21 DIAGNOSIS — I10 Essential (primary) hypertension: Secondary | ICD-10-CM | POA: Diagnosis not present

## 2020-12-21 DIAGNOSIS — E1142 Type 2 diabetes mellitus with diabetic polyneuropathy: Secondary | ICD-10-CM | POA: Diagnosis not present

## 2020-12-21 DIAGNOSIS — I482 Chronic atrial fibrillation, unspecified: Secondary | ICD-10-CM | POA: Diagnosis not present

## 2020-12-27 ENCOUNTER — Other Ambulatory Visit: Payer: Self-pay

## 2020-12-27 MED ORDER — METOPROLOL SUCCINATE ER 50 MG PO TB24: 50.0000 mg | ORAL_TABLET | Freq: Two times a day (BID) | ORAL | 3 refills | Status: DC

## 2020-12-27 NOTE — Telephone Encounter (Signed)
Refilled toprol to [employer] order

## 2021-01-20 DIAGNOSIS — I482 Chronic atrial fibrillation, unspecified: Secondary | ICD-10-CM | POA: Diagnosis not present

## 2021-01-20 DIAGNOSIS — Z7984 Long term (current) use of oral hypoglycemic drugs: Secondary | ICD-10-CM | POA: Diagnosis not present

## 2021-01-20 DIAGNOSIS — I10 Essential (primary) hypertension: Secondary | ICD-10-CM | POA: Diagnosis not present

## 2021-01-20 DIAGNOSIS — E1142 Type 2 diabetes mellitus with diabetic polyneuropathy: Secondary | ICD-10-CM | POA: Diagnosis not present

## 2021-01-27 ENCOUNTER — Other Ambulatory Visit: Payer: Self-pay

## 2021-01-27 ENCOUNTER — Ambulatory Visit (INDEPENDENT_AMBULATORY_CARE_PROVIDER_SITE_OTHER): Payer: Medicare Other | Admitting: *Deleted

## 2021-01-27 DIAGNOSIS — Q231 Congenital insufficiency of aortic valve: Secondary | ICD-10-CM

## 2021-01-27 DIAGNOSIS — I9789 Other postprocedural complications and disorders of the circulatory system, not elsewhere classified: Secondary | ICD-10-CM | POA: Diagnosis not present

## 2021-01-27 DIAGNOSIS — Z953 Presence of xenogenic heart valve: Secondary | ICD-10-CM

## 2021-01-27 DIAGNOSIS — Z9889 Other specified postprocedural states: Secondary | ICD-10-CM

## 2021-01-27 DIAGNOSIS — I4891 Unspecified atrial fibrillation: Secondary | ICD-10-CM

## 2021-01-27 DIAGNOSIS — Z5181 Encounter for therapeutic drug level monitoring: Secondary | ICD-10-CM

## 2021-01-27 DIAGNOSIS — I82603 Acute embolism and thrombosis of unspecified veins of upper extremity, bilateral: Secondary | ICD-10-CM | POA: Diagnosis not present

## 2021-01-27 LAB — POCT INR: INR: 2.3 (ref 2.0–3.0)

## 2021-01-27 NOTE — Patient Instructions (Signed)
Continue warfarin 2 tablets daily  Continue Vit K foods Recheck 6 wks

## 2021-02-10 ENCOUNTER — Other Ambulatory Visit: Payer: Self-pay | Admitting: Cardiology

## 2021-02-19 DIAGNOSIS — E1142 Type 2 diabetes mellitus with diabetic polyneuropathy: Secondary | ICD-10-CM | POA: Diagnosis not present

## 2021-02-19 DIAGNOSIS — I482 Chronic atrial fibrillation, unspecified: Secondary | ICD-10-CM | POA: Diagnosis not present

## 2021-02-19 DIAGNOSIS — Z7984 Long term (current) use of oral hypoglycemic drugs: Secondary | ICD-10-CM | POA: Diagnosis not present

## 2021-02-19 DIAGNOSIS — I10 Essential (primary) hypertension: Secondary | ICD-10-CM | POA: Diagnosis not present

## 2021-03-10 DIAGNOSIS — E1142 Type 2 diabetes mellitus with diabetic polyneuropathy: Secondary | ICD-10-CM | POA: Diagnosis not present

## 2021-03-10 DIAGNOSIS — E782 Mixed hyperlipidemia: Secondary | ICD-10-CM | POA: Diagnosis not present

## 2021-03-10 DIAGNOSIS — E7801 Familial hypercholesterolemia: Secondary | ICD-10-CM | POA: Diagnosis not present

## 2021-03-10 DIAGNOSIS — E7800 Pure hypercholesterolemia, unspecified: Secondary | ICD-10-CM | POA: Diagnosis not present

## 2021-03-10 DIAGNOSIS — K2100 Gastro-esophageal reflux disease with esophagitis, without bleeding: Secondary | ICD-10-CM | POA: Diagnosis not present

## 2021-03-10 DIAGNOSIS — E7849 Other hyperlipidemia: Secondary | ICD-10-CM | POA: Diagnosis not present

## 2021-03-10 DIAGNOSIS — E1121 Type 2 diabetes mellitus with diabetic nephropathy: Secondary | ICD-10-CM | POA: Diagnosis not present

## 2021-03-10 DIAGNOSIS — E1151 Type 2 diabetes mellitus with diabetic peripheral angiopathy without gangrene: Secondary | ICD-10-CM | POA: Diagnosis not present

## 2021-03-14 DIAGNOSIS — N401 Enlarged prostate with lower urinary tract symptoms: Secondary | ICD-10-CM | POA: Diagnosis not present

## 2021-03-14 DIAGNOSIS — R197 Diarrhea, unspecified: Secondary | ICD-10-CM | POA: Diagnosis not present

## 2021-03-14 DIAGNOSIS — I10 Essential (primary) hypertension: Secondary | ICD-10-CM | POA: Diagnosis not present

## 2021-03-14 DIAGNOSIS — E1140 Type 2 diabetes mellitus with diabetic neuropathy, unspecified: Secondary | ICD-10-CM | POA: Diagnosis not present

## 2021-03-14 DIAGNOSIS — G2581 Restless legs syndrome: Secondary | ICD-10-CM | POA: Diagnosis not present

## 2021-03-14 DIAGNOSIS — M545 Low back pain, unspecified: Secondary | ICD-10-CM | POA: Diagnosis not present

## 2021-03-14 DIAGNOSIS — M1A9XX0 Chronic gout, unspecified, without tophus (tophi): Secondary | ICD-10-CM | POA: Diagnosis not present

## 2021-03-14 DIAGNOSIS — E7849 Other hyperlipidemia: Secondary | ICD-10-CM | POA: Diagnosis not present

## 2021-03-22 DIAGNOSIS — E1142 Type 2 diabetes mellitus with diabetic polyneuropathy: Secondary | ICD-10-CM | POA: Diagnosis not present

## 2021-03-22 DIAGNOSIS — I10 Essential (primary) hypertension: Secondary | ICD-10-CM | POA: Diagnosis not present

## 2021-03-22 DIAGNOSIS — Z7984 Long term (current) use of oral hypoglycemic drugs: Secondary | ICD-10-CM | POA: Diagnosis not present

## 2021-03-22 DIAGNOSIS — I482 Chronic atrial fibrillation, unspecified: Secondary | ICD-10-CM | POA: Diagnosis not present

## 2021-03-24 ENCOUNTER — Ambulatory Visit (INDEPENDENT_AMBULATORY_CARE_PROVIDER_SITE_OTHER): Payer: Medicare Other | Admitting: *Deleted

## 2021-03-24 ENCOUNTER — Other Ambulatory Visit: Payer: Self-pay

## 2021-03-24 DIAGNOSIS — Z5181 Encounter for therapeutic drug level monitoring: Secondary | ICD-10-CM

## 2021-03-24 DIAGNOSIS — I82603 Acute embolism and thrombosis of unspecified veins of upper extremity, bilateral: Secondary | ICD-10-CM | POA: Diagnosis not present

## 2021-03-24 DIAGNOSIS — I9789 Other postprocedural complications and disorders of the circulatory system, not elsewhere classified: Secondary | ICD-10-CM | POA: Diagnosis not present

## 2021-03-24 DIAGNOSIS — Z953 Presence of xenogenic heart valve: Secondary | ICD-10-CM

## 2021-03-24 DIAGNOSIS — I4891 Unspecified atrial fibrillation: Secondary | ICD-10-CM | POA: Diagnosis not present

## 2021-03-24 DIAGNOSIS — Q231 Congenital insufficiency of aortic valve: Secondary | ICD-10-CM

## 2021-03-24 DIAGNOSIS — Z9889 Other specified postprocedural states: Secondary | ICD-10-CM

## 2021-03-24 LAB — POCT INR: INR: 2.5 (ref 2.0–3.0)

## 2021-03-24 NOTE — Patient Instructions (Signed)
Continue warfarin 2 tablets daily  Continue Vit K foods Recheck 6 wks

## 2021-03-27 ENCOUNTER — Encounter: Payer: Self-pay | Admitting: Orthopedic Surgery

## 2021-03-27 ENCOUNTER — Ambulatory Visit (INDEPENDENT_AMBULATORY_CARE_PROVIDER_SITE_OTHER): Payer: Medicare Other | Admitting: Physician Assistant

## 2021-03-27 DIAGNOSIS — S68412S Complete traumatic amputation of left hand at wrist level, sequela: Secondary | ICD-10-CM

## 2021-03-27 DIAGNOSIS — M25562 Pain in left knee: Secondary | ICD-10-CM

## 2021-03-27 DIAGNOSIS — S68411S Complete traumatic amputation of right hand at wrist level, sequela: Secondary | ICD-10-CM

## 2021-03-27 DIAGNOSIS — M25561 Pain in right knee: Secondary | ICD-10-CM

## 2021-03-27 NOTE — Progress Notes (Signed)
Office Visit Note   Patient: Scott [name]           Date of Birth: 12-Jul-1946           MRN: [id number] Visit Date: 03/27/2021              Requested by: Sasser, Silvestre Moment, MD [place],  [place] [id number] PCP: Manon Hilding, MD  Chief Complaint  Patient presents with  . Right Knee - Pain  . Left Knee - Pain      HPI: Patient is a pleasant 75 year old gentleman who is status post bilateral below-knee amputations and bilateral hand amputations he is right-hand dominant.  He comes in today because his current prosthetic for his hand is ill fitting and does not cover his hand well.  He is getting more nerve pain and it makes it difficult for him to push off to a standing position because this area is not well-padded from his current prosthetic  Assessment & Plan: Visit Diagnoses: No diagnosis found.  Plan: Prescription was provided for new hand prosthetic.  This is necessary to protect his hand he is a right-handed gentleman who uses his hand prosthetic for activities of daily living.  Follow-Up Instructions: No follow-ups on file.   Ortho Exam  Patient is alert, oriented, no adenopathy, well-dressed, normal affect, normal respiratory effort. Status post right hand amputation.  He does have some callusing in the webspace between the thumb and the index finger.  Amputation stump is in good condition  Imaging: No results found. No images are attached to the encounter.  Labs: Lab Results  Component Value Date   HGBA1C 6.6 (H) 10/21/2016   REPTSTATUS 02/06/2017 FINAL 02/01/2017   GRAMSTAIN  02/01/2017    ABUNDANT WBC PRESENT, PREDOMINANTLY PMN MODERATE GRAM NEGATIVE RODS    CULT  02/01/2017    FEW PSEUDOMONAS AERUGINOSA MODERATE BACTEROIDES FRAGILIS BETA LACTAMASE POSITIVE    LABORGA PSEUDOMONAS AERUGINOSA 02/01/2017     Lab Results  Component Value Date   ALBUMIN 3.2 (L) 03/11/2017   ALBUMIN 2.0 (L) 02/05/2017   ALBUMIN 2.3 (L) 12/10/2016   PREALBUMIN  10.8 (L) 11/16/2016   PREALBUMIN 19.7 10/21/2016    Lab Results  Component Value Date   MG 1.8 08/22/2019   MG 1.5 (L) 08/08/2019   MG 1.9 01/05/2017   No results found for: VD25OH  Lab Results  Component Value Date   PREALBUMIN 10.8 (L) 11/16/2016   PREALBUMIN 19.7 10/21/2016   CBC EXTENDED Latest Ref Rng & Units 03/11/2017 02/15/2017 02/05/2017  WBC 4.0 - 10.5 K/uL 6.7 7.1 10.9(H)  RBC 4.22 - 5.81 MIL/uL 4.32 4.26 3.42(L)  HGB 13.0 - 17.0 g/dL 11.5(L) 10.6(L) 8.4(L)  HCT 39.0 - 52.0 % 36.6(L) 36.0(L) 28.5(L)  PLT 150 - 400 K/uL 174 249 357  NEUTROABS 1.7 - 7.7 K/uL - - 8.3(H)  LYMPHSABS 0.7 - 4.0 K/uL - - 1.5     There is no height or weight on file to calculate BMI.  Orders:  No orders of the defined types were placed in this encounter.  No orders of the defined types were placed in this encounter.    Procedures: No procedures performed  Clinical Data: No additional findings.  ROS:  All other systems negative, except as noted in the HPI. Review of Systems  Objective: Vital Signs: There were no vitals taken for this visit.  Specialty Comments:  No specialty comments available.  PMFS History: Patient Active Problem List  Diagnosis Date Noted  . Amputation of both hands with complication, subsequent encounter 03/01/2017  . Diabetes ([place]) 02/19/2017  . Status post bilateral below knee amputation ([place]) 02/09/2017  . Wound disruption, post-op, skin, sequela 02/09/2017  . Debility 02/05/2017  . Ganglion upper arm, right   . Thrombosis of both upper extremities   . Suspected heparin induced thrombocytopenia (HIT) in hospitalized patient   . Gangrene of lower extremity ([place])   . Heparin induced thrombocytopenia (HIT) ([place])   . Tracheostomy status ([place])   . Chest tube in place   . Atherosclerosis of native arteries of extremities with gangrene, left leg ([place])   . Atherosclerosis of native arteries of extremities with gangrene, right leg ([place])   . Acute on  chronic diastolic CHF (congestive heart failure) ([employer])   . Enteritis due to Clostridium difficile   . Anasarca   . FUO (fever of unknown origin)   . Acute encephalopathy   . Elevated LFTs   . DIC (disseminated intravascular coagulation) ([place]) 10/28/2016  . Postoperative atrial fibrillation ([place]) 10/24/2016  . Cardiogenic shock ([place])   . Mitral regurgitation due to cusp prolapse 10/22/2016  . S/P aortic valve replacement with bioprosthetic valve 10/22/2016  . S/P ascending aortic aneurysm repair 10/22/2016  . S/P mitral valve repair 10/22/2016  . S/P CABG x 3 10/22/2016  . Thrombocytopenia ([place]) 10/22/2016  . Acute combined systolic and diastolic congestive heart failure ([place]) 10/22/2016  . Acute respiratory failure ([place]) 10/22/2016  . Thoracic ascending aortic aneurysm ([place]) 10/21/2016  . Coronary artery disease involving native coronary artery of native heart with unstable angina pectoris ([place]) 10/20/2016  . Severe aortic stenosis by prior echocardiography 10/19/2016  . Unstable angina ([place]) 10/19/2016  . Aortic valve stenosis 10/19/2016  . Bicuspid aortic valve 10/19/2016  . Trigger ring finger of right hand   . Wears glasses   . Gout   . Hypertension   . Carpal tunnel syndrome of right wrist   . Arthritis   . Snores    Past Medical History:  Diagnosis Date  . Arthritis    "hips, shoulders; knees; back" (10/20/2016)  . Bicuspid aortic valve   . BPH (benign prostatic hypertrophy)   . Carpal tunnel syndrome of right wrist   . Coronary artery disease involving native coronary artery of native heart with unstable angina pectoris ([place]) 10/20/2016  . Diverticulitis   . GERD (gastroesophageal reflux disease)   . Gout   . Heart murmur   . Hyperlipemia   . Hypertension   . Postoperative atrial fibrillation ([place]) 10/24/2016  . RLS (restless legs syndrome)   . S/P aortic valve replacement with bioprosthetic valve 10/22/2016   25 mm [facility] Ease bovine pericardial  bioprosthetic tissue valve  . S/P ascending aortic aneurysm repair 10/22/2016   28 mm supracoronary straight graft replacement of ascending thoracic aortic aneurysm  . S/P CABG x 3 10/22/2016   Sequential LIMA to Diag and LAD, SVG to distal LAD, open vein harvest right thigh  . S/P mitral valve repair 10/22/2016   Artificial Gore-tex neochord placement x6 - posterior annuloplasty band placed but removed due to systolic anterior motion of mitral valve  . Severe aortic stenosis   . Snores    Never been tested for sleep apnea  . Thoracic ascending aortic aneurysm ([place]) 10/21/2016  . Thrombocytopenia ([place]) 10/22/2016  . Type II diabetes mellitus ([facility])     Family History  Problem Relation Age of Onset  . Lung cancer Mother   .  Clotting disorder Father        No details  . Heart disease Sister 29       Stents  . Cancer Sister        Throat    Past Surgical History:  Procedure Laterality Date  . AMPUTATION Bilateral 12/11/2016   Procedure: AMPUTATION BELOW KNEE BILATERALLY;  Surgeon: [name], MD;  Location: [facility];  Service: Orthopedics;  Laterality: Bilateral;  . AMPUTATION Bilateral 12/11/2016   Procedure: AMPUTATION BILATERAL HANDS EXCEPT RIGHT THUMB;  Surgeon: [name], MD;  Location: [facility];  Service: Orthopedics;  Laterality: Bilateral;  . AORTIC VALVE REPLACEMENT N/A 10/22/2016   Procedure: AORTIC VALVE REPLACEMENT (AVR) WITH SIZE 25 MM MAGNA EASE PERICARDIAL BIOPROSTHESIS - AORTIC;  Surgeon: Rexene Alberts, MD;  Location: [facility];  Service: Open Heart Surgery;  Laterality: N/A;  . BONE EXCISION Right 02/01/2017   Procedure: EXCISION RIGHT INDEX METACARPAL HEAD;  Surgeon: [name], MD;  Location: [facility];  Service: Orthopedics;  Laterality: Right;  . CARDIAC CATHETERIZATION N/A 10/20/2016   Procedure: Right/Left Heart Cath and Coronary Angiography;  Surgeon: Leonie Man, MD;  Location: [facility];  Service: Cardiovascular;  Laterality: N/A;  . CARPAL TUNNEL  RELEASE Right 11/28/2013   Procedure: RIGHT WRIST CARPAL TUNNEL RELEASE;  Surgeon: Lorn Junes, MD;  Location: [facility];  Service: Orthopedics;  Laterality: Right;  . CATARACT EXTRACTION W/ INTRAOCULAR LENS  IMPLANT, BILATERAL Bilateral 1978  . COLONOSCOPY    . CORONARY ARTERY BYPASS GRAFT N/A 10/22/2016   Procedure: CORONARY ARTERY BYPASS GRAFTING (CABG)x 2 WITH LIMA TO DIAGONAL, OPEN  HARVESTING OF RIGHT SAPHENOUS VEIN FOR VEIN GRAFT TO LAD;  Surgeon: Rexene Alberts, MD;  Location: [facility];  Service: Open Heart Surgery;  Laterality: N/A;  . FRACTURE SURGERY    . INGUINAL HERNIA REPAIR Right 1998  . IR GENERIC HISTORICAL  12/07/2016   IR US GUIDE VASC ACCESS RIGHT 12/07/2016 Aletta Edouard, MD [facility]  . IR GENERIC HISTORICAL  12/07/2016   IR RADIOLOGY PERIPHERAL GUIDED IV START 12/07/2016 Aletta Edouard, MD [facility]  . IR GENERIC HISTORICAL  12/07/2016   IR GASTROSTOMY TUBE MOD SED 12/07/2016 Aletta Edouard, MD [facility]  . LIPOMA EXCISION Right 2008   "side of my head"  . MITRAL VALVE REPAIR N/A 10/22/2016   Procedure: MITRAL VALVE REPAIR (MVR) WITH SIZE 30 SORIN ANNULOFLEX ANNULOPLASTY RING WITH SUBSEQUENT REMOVAL OF RING;  Surgeon: Rexene Alberts, MD;  Location: [facility];  Service: Open Heart Surgery;  Laterality: N/A;  . PENECTOMY  2007   Peyronie's disease   . PENILE PROSTHESIS IMPLANT  2009  . REMOVAL OF PENILE PROSTHESIS N/A 02/01/2017   Procedure: REMOVAL OF PENILE PROSTHESIS;  Surgeon: Kathie Rhodes, MD;  Location: [facility];  Service: Urology;  Laterality: N/A;  . SHOULDER OPEN ROTATOR CUFF REPAIR Right 2006  . STERNAL CLOSURE N/A 10/26/2016   Procedure: STERNAL WASHOUT AND DELAYED PRIMARY CLOSURE;  Surgeon: Rexene Alberts, MD;  Location: [facility];  Service: Thoracic;  Laterality: N/A;  . TEE WITHOUT CARDIOVERSION N/A 10/26/2016   Procedure: TRANSESOPHAGEAL ECHOCARDIOGRAM (TEE);  Surgeon: Rexene Alberts, MD;  Location: [facility];  Service: Thoracic;  Laterality:  N/A;  . TEE WITHOUT CARDIOVERSION N/A 10/22/2016   Procedure: TRANSESOPHAGEAL ECHOCARDIOGRAM (TEE);  Surgeon: Rexene Alberts, MD;  Location: [facility];  Service: Open Heart Surgery;  Laterality: N/A;  . THORACIC AORTIC ANEURYSM REPAIR  10/22/2016  Procedure: ASCENDING AORTIC  ANEURYSM REPAIR (AAA) WITH 28 MM HEMASHIELD PLATINUM WOVEN DOUBLE VELOUR VASCULAR GRAFT;  Surgeon: Rexene Alberts, MD;  Location: [facility];  Service: Open Heart Surgery;;  . TONSILLECTOMY  ~ 1955  . TRACHEOSTOMY TUBE PLACEMENT N/A 11/09/2016   Procedure: TRACHEOSTOMY;  Surgeon: Rexene Alberts, MD;  Location: [facility];  Service: Thoracic;  Laterality: N/A;  . TRANSURETHRAL RESECTION OF PROSTATE  2005  . TRIGGER FINGER RELEASE Bilateral    several lt and rt hands  . TRIGGER FINGER RELEASE Right 11/28/2013   Procedure: RIGHT TRIGGER FINGER  RELEASE (TENDON SHEATH INCISION);  Surgeon: Lorn Junes, MD;  Location: [facility];  Service: Orthopedics;  Laterality: Right;  Marland Kitchen VIDEO BRONCHOSCOPY N/A 11/09/2016   Procedure: VIDEO BRONCHOSCOPY;  Surgeon: Rexene Alberts, MD;  Location: [facility];  Service: Thoracic;  Laterality: N/A;  . WRIST FRACTURE SURGERY Right ~ 1959   Social History   Occupational History  . Occupation: Designer, multimedia: [employer]    Comment: retired  Tobacco Use  . Smoking status: Former Smoker    Years: 3.00    Types: Pipe, Cigars    Quit date: 1984    Years since quitting: 38.3  . Smokeless tobacco: Never Used  Vaping Use  . Vaping Use: Never used  Substance and Sexual Activity  . Alcohol use: Yes    Alcohol/week: 10.0 standard drinks    Types: 10 Cans of beer per week  . Drug use: No  . Sexual activity: Not Currently

## 2021-04-13 ENCOUNTER — Other Ambulatory Visit: Payer: Self-pay | Admitting: Cardiology

## 2021-04-21 DIAGNOSIS — E1142 Type 2 diabetes mellitus with diabetic polyneuropathy: Secondary | ICD-10-CM | POA: Diagnosis not present

## 2021-04-21 DIAGNOSIS — I482 Chronic atrial fibrillation, unspecified: Secondary | ICD-10-CM | POA: Diagnosis not present

## 2021-04-21 DIAGNOSIS — Z7984 Long term (current) use of oral hypoglycemic drugs: Secondary | ICD-10-CM | POA: Diagnosis not present

## 2021-04-21 DIAGNOSIS — I10 Essential (primary) hypertension: Secondary | ICD-10-CM | POA: Diagnosis not present

## 2021-05-05 ENCOUNTER — Ambulatory Visit (INDEPENDENT_AMBULATORY_CARE_PROVIDER_SITE_OTHER): Payer: Medicare Other | Admitting: *Deleted

## 2021-05-05 DIAGNOSIS — Z5181 Encounter for therapeutic drug level monitoring: Secondary | ICD-10-CM | POA: Diagnosis not present

## 2021-05-05 DIAGNOSIS — I4891 Unspecified atrial fibrillation: Secondary | ICD-10-CM

## 2021-05-05 DIAGNOSIS — I82603 Acute embolism and thrombosis of unspecified veins of upper extremity, bilateral: Secondary | ICD-10-CM

## 2021-05-05 DIAGNOSIS — Z953 Presence of xenogenic heart valve: Secondary | ICD-10-CM

## 2021-05-05 DIAGNOSIS — Q231 Congenital insufficiency of aortic valve: Secondary | ICD-10-CM

## 2021-05-05 DIAGNOSIS — Z9889 Other specified postprocedural states: Secondary | ICD-10-CM | POA: Diagnosis not present

## 2021-05-05 DIAGNOSIS — I9789 Other postprocedural complications and disorders of the circulatory system, not elsewhere classified: Secondary | ICD-10-CM

## 2021-05-05 LAB — POCT INR: INR: 2.8 (ref 2.0–3.0)

## 2021-05-05 NOTE — Patient Instructions (Signed)
Continue warfarin 2 tablets daily  Continue Vit K foods Recheck 6 wks

## 2021-05-27 ENCOUNTER — Other Ambulatory Visit: Payer: Self-pay | Admitting: Cardiology

## 2021-06-02 ENCOUNTER — Other Ambulatory Visit: Payer: Self-pay | Admitting: Cardiology

## 2021-06-09 ENCOUNTER — Ambulatory Visit (INDEPENDENT_AMBULATORY_CARE_PROVIDER_SITE_OTHER): Payer: Medicare Other | Admitting: Cardiology

## 2021-06-09 ENCOUNTER — Encounter: Payer: Self-pay | Admitting: Cardiology

## 2021-06-09 ENCOUNTER — Other Ambulatory Visit: Payer: Self-pay

## 2021-06-09 VITALS — BP 120/60 | HR 68 | Ht 69.0 in | Wt 213.8 lb

## 2021-06-09 DIAGNOSIS — Z953 Presence of xenogenic heart valve: Secondary | ICD-10-CM

## 2021-06-09 DIAGNOSIS — I25110 Atherosclerotic heart disease of native coronary artery with unstable angina pectoris: Secondary | ICD-10-CM

## 2021-06-09 DIAGNOSIS — I4891 Unspecified atrial fibrillation: Secondary | ICD-10-CM | POA: Diagnosis not present

## 2021-06-09 DIAGNOSIS — I2510 Atherosclerotic heart disease of native coronary artery without angina pectoris: Secondary | ICD-10-CM | POA: Diagnosis not present

## 2021-06-09 NOTE — Patient Instructions (Signed)
Medication Instructions:  Continue all current medications.   Labwork: none  Testing/Procedures: none  Follow-Up: 6 months   Any Other Special Instructions Will Be Listed Below (If Applicable).   If you need a refill on your cardiac medications before your next appointment, please call your pharmacy.  

## 2021-06-09 NOTE — Progress Notes (Signed)
Clinical Summary Mr. Edgell is a 75 y.o.maleseen today for follow up of the following medical problems.    1. Aortic stenosis - history of bicuspid AV - history of AVR 10/22/2016,  [facility] Ease Pericardial Tissue Valve (size 52mm, model # 3300TFX, serial # [id number]) along with 3 vessel CABG, MV repair, and repair of ascending aortic aneurysm     - OR on 10/22/16 for CABG X 3, AVR, MVR, repair of thoracic ascending aneurysm with placement of VAC on open chest Post op course significant for cardiogenic shock requiring multiple pressors, SAM requiring removal of mitral annuloplasty ring,  persistent fevers, encephalopathy, shocked liver, DIC due to consumptive intraoperative coagulopathy, diffuse clotting with ischemia of distal BUE/BLE extremities, A fib, C diff colitis, volume overload and VDRF with difficulty with vent wean requiring tracheostomy.  Sternal wound closed on 12/4 and was started on coumadin for RV thrombus and A fib.     [facility] course significant for MRSA bacteremia, pneumonitis, intermittent fevers, dysphagia requiring PEG tube placement on 1/15 by Dr. Kathlene Cote. He developed progressive gangrenous changes of BUE and BLE requiring B-transtibial amputation , left transmetacarpal amputation and Right MCP amputation of right hand by Dr. Sharol Given on 1/19.    01/2017 echo: 55-60%, no WMAs, grade II diastolic dysfunction. Normal functioning AV.   07/2019 echo: LVEF 55-60%, mild RV dysfunction, mild to mod MR   No SOB/DOE, no chest pains - compliant with meds        2. Mitral regurgitation - patient had MV ring placed during his surgery 10/22/16 along with AVR, ascending thoracic aneurysm repair, and CABG. - ring was subsequently removed during that operation due to significant SAM - 02/15/17 echo shows only mild MR    - no symptoms   3. CAD - history of CABG 10/22/2016 (LIMA-Diag and distal LAD, SVG-LAD) 01/2017 echo: 55-60%, no WMAs, grade II diastolic dysfunction.  Normal functioning AV.     - denies any chest pain, SOB or DOE   4. Ischemic limbs - occurred in setting of cardiogenic shock requiring high dose pressors as well as DIC  - required amputation - followed by ortho.   5.Afib/aflutter - initally had post op afib - noted to be in aflutter at clinical f/u and thus was committed to long term anticoagulation  08/2019 monitor no significant arrhytmias   - no symptoms     6. Hyperlipidemia - 02/2021 TC 99 TG 227 HDL 30 LDL 33 - he is on atorvastatin  Past Medical History:  Diagnosis Date   Arthritis    "hips, shoulders; knees; back" (10/20/2016)   Bicuspid aortic valve    BPH (benign prostatic hypertrophy)    Carpal tunnel syndrome of right wrist    Coronary artery disease involving native coronary artery of native heart with unstable angina pectoris ([place]) 10/20/2016   Diverticulitis    GERD (gastroesophageal reflux disease)    Gout    Heart murmur    Hyperlipemia    Hypertension    Postoperative atrial fibrillation ([place]) 10/24/2016   RLS (restless legs syndrome)    S/P aortic valve replacement with bioprosthetic valve 10/22/2016   25 mm [facility] Ease bovine pericardial bioprosthetic tissue valve   S/P ascending aortic aneurysm repair 10/22/2016   28 mm supracoronary straight graft replacement of ascending thoracic aortic aneurysm   S/P CABG x 3 10/22/2016   Sequential LIMA to Diag and LAD, SVG to distal LAD, open vein harvest right thigh   S/P mitral  valve repair 10/22/2016   Artificial Gore-tex neochord placement x6 - posterior annuloplasty band placed but removed due to systolic anterior motion of mitral valve   Severe aortic stenosis    Snores    Never been tested for sleep apnea   Thoracic ascending aortic aneurysm ([place]) 10/21/2016   Thrombocytopenia ([place]) 10/22/2016   Type II diabetes mellitus (HCC)      Allergies  Allergen Reactions   Sulfa Antibiotics Hives     Current Outpatient Medications  Medication  Sig Dispense Refill   allopurinol (ZYLOPRIM) 300 MG tablet Take 300 mg by mouth daily.     aspirin EC 81 MG tablet Take 81 mg by mouth daily.     atorvastatin (LIPITOR) 40 MG tablet TAKE 1 TABLET DAILY AT 6PM 90 tablet 3   KLOR-CON M20 20 MEQ tablet TAKE 1 TABLET TWICE A DAY 180 tablet 3   lisinopril (ZESTRIL) 2.5 MG tablet TAKE 1 TABLET DAILY 90 tablet 3   Magnesium Oxide (MAG-OX 400 PO) Take 400 mg by mouth daily.     Melatonin 10 MG CAPS Take 10 mg by mouth at bedtime.      metFORMIN (GLUCOPHAGE) 500 MG tablet Take 500 mg by mouth daily with breakfast. & 1000 mg by mouth in the evening     metoprolol succinate (TOPROL-XL) 50 MG 24 hr tablet Take 1 tablet (50 mg total) by mouth 2 (two) times daily. 180 tablet 3   nitroGLYCERIN (NITROSTAT) 0.4 MG SL tablet Place 1 tablet (0.4 mg total) under the tongue every 5 (five) minutes as needed. 25 tablet 3   rOPINIRole (REQUIP) 2 MG tablet Take 2 mg by mouth See admin instructions. Takes 2mg  at 4:30pm and 2mg  at 9:30pm.     tamsulosin (FLOMAX) 0.4 MG CAPS capsule Take 0.4 mg by mouth daily.     torsemide (DEMADEX) 20 MG tablet TAKE 2 TABLETS EVERY                     MORNING AND 1 TABLET EVERY               EVENING. MAY TAKE                        ADDITIONAL 1 TABLET AS NEEDED 270 tablet 2   warfarin (COUMADIN) 2 MG tablet TAKE 2 TABLETS BY MOUTH DAILY OR AS DIRECTED 180 tablet 2   No current facility-administered medications for this visit.     Past Surgical History:  Procedure Laterality Date   AMPUTATION Bilateral 12/11/2016   Procedure: AMPUTATION BELOW KNEE BILATERALLY;  Surgeon: [name], MD;  Location: [place];  Service: Orthopedics;  Laterality: Bilateral;   AMPUTATION Bilateral 12/11/2016   Procedure: AMPUTATION BILATERAL HANDS EXCEPT RIGHT THUMB;  Surgeon: [name], MD;  Location: [place];  Service: Orthopedics;  Laterality: Bilateral;   AORTIC VALVE REPLACEMENT N/A 10/22/2016   Procedure: AORTIC VALVE REPLACEMENT (AVR) WITH SIZE 25  MM MAGNA EASE PERICARDIAL BIOPROSTHESIS - AORTIC;  Surgeon: Rexene Alberts, MD;  Location: [place];  Service: Open Heart Surgery;  Laterality: N/A;   BONE EXCISION Right 02/01/2017   Procedure: EXCISION RIGHT INDEX METACARPAL HEAD;  Surgeon: [name], MD;  Location: [place];  Service: Orthopedics;  Laterality: Right;   CARDIAC CATHETERIZATION N/A 10/20/2016   Procedure: Right/Left Heart Cath and Coronary Angiography;  Surgeon: Leonie Man, MD;  Location: [place] [facility];  Service: Cardiovascular;  Laterality:  N/A;   CARPAL TUNNEL RELEASE Right 11/28/2013   Procedure: RIGHT WRIST CARPAL TUNNEL RELEASE;  Surgeon: Lorn Junes, MD;  Location: [place];  Service: Orthopedics;  Laterality: Right;   CATARACT EXTRACTION W/ INTRAOCULAR LENS  IMPLANT, BILATERAL Bilateral 1978   COLONOSCOPY     CORONARY ARTERY BYPASS GRAFT N/A 10/22/2016   Procedure: CORONARY ARTERY BYPASS GRAFTING (CABG)x 2 WITH LIMA TO DIAGONAL, OPEN  HARVESTING OF RIGHT SAPHENOUS VEIN FOR VEIN GRAFT TO LAD;  Surgeon: Rexene Alberts, MD;  Location: [place];  Service: Open Heart Surgery;  Laterality: N/A;   FRACTURE SURGERY     INGUINAL HERNIA REPAIR Right 1998   IR GENERIC HISTORICAL  12/07/2016   IR US GUIDE VASC ACCESS RIGHT 12/07/2016 Aletta Edouard, MD [facility]   IR GENERIC HISTORICAL  12/07/2016   IR RADIOLOGY PERIPHERAL GUIDED IV START 12/07/2016 Aletta Edouard, MD [facility]   IR GENERIC HISTORICAL  12/07/2016   IR GASTROSTOMY TUBE MOD SED 12/07/2016 Aletta Edouard, MD [facility]   LIPOMA EXCISION Right 2008   "side of my head"   MITRAL VALVE REPAIR N/A 10/22/2016   Procedure: MITRAL VALVE REPAIR (MVR) WITH SIZE 30 SORIN ANNULOFLEX ANNULOPLASTY RING WITH SUBSEQUENT REMOVAL OF RING;  Surgeon: Rexene Alberts, MD;  Location: [place];  Service: Open Heart Surgery;  Laterality: N/A;   PENECTOMY  2007   Peyronie's disease    PENILE PROSTHESIS IMPLANT  2009   REMOVAL OF PENILE PROSTHESIS N/A 02/01/2017    Procedure: REMOVAL OF PENILE PROSTHESIS;  Surgeon: Kathie Rhodes, MD;  Location: [place];  Service: Urology;  Laterality: N/A;   SHOULDER OPEN ROTATOR CUFF REPAIR Right 2006   STERNAL CLOSURE N/A 10/26/2016   Procedure: STERNAL WASHOUT AND DELAYED PRIMARY CLOSURE;  Surgeon: Rexene Alberts, MD;  Location: [place];  Service: Thoracic;  Laterality: N/A;   TEE WITHOUT CARDIOVERSION N/A 10/26/2016   Procedure: TRANSESOPHAGEAL ECHOCARDIOGRAM (TEE);  Surgeon: Rexene Alberts, MD;  Location: [place];  Service: Thoracic;  Laterality: N/A;   TEE WITHOUT CARDIOVERSION N/A 10/22/2016   Procedure: TRANSESOPHAGEAL ECHOCARDIOGRAM (TEE);  Surgeon: Rexene Alberts, MD;  Location: [place];  Service: Open Heart Surgery;  Laterality: N/A;   THORACIC AORTIC ANEURYSM REPAIR  10/22/2016   Procedure: ASCENDING AORTIC  ANEURYSM REPAIR (AAA) WITH 28 MM HEMASHIELD PLATINUM WOVEN DOUBLE VELOUR VASCULAR GRAFT;  Surgeon: Rexene Alberts, MD;  Location: [place];  Service: Open Heart Surgery;;   TONSILLECTOMY  ~ Donna N/A 11/09/2016   Procedure: TRACHEOSTOMY;  Surgeon: Rexene Alberts, MD;  Location: [place];  Service: Thoracic;  Laterality: N/A;   TRANSURETHRAL RESECTION OF PROSTATE  2005   TRIGGER FINGER RELEASE Bilateral    several lt and rt hands   TRIGGER FINGER RELEASE Right 11/28/2013   Procedure: RIGHT TRIGGER FINGER  RELEASE (TENDON SHEATH INCISION);  Surgeon: Lorn Junes, MD;  Location: [place];  Service: Orthopedics;  Laterality: Right;   VIDEO BRONCHOSCOPY N/A 11/09/2016   Procedure: VIDEO BRONCHOSCOPY;  Surgeon: Rexene Alberts, MD;  [facility];  Service: Thoracic;  Laterality: N/A;   WRIST FRACTURE SURGERY Right ~ 1959     Allergies  Allergen Reactions   Sulfa Antibiotics Hives      Family History  Problem Relation Age of Onset   Lung cancer Mother    Clotting disorder Father        No details   Heart disease Sister  43       Stents   Cancer Sister         Throat     Social History Mr. Syme reports that he quit smoking about 38 years ago. His smoking use included pipe and cigars. He has never used smokeless tobacco. Mr. Leaf reports current alcohol use of about 10.0 standard drinks of alcohol per week.   Review of Systems CONSTITUTIONAL: No weight loss, fever, chills, weakness or fatigue.  HEENT: Eyes: No visual loss, blurred vision, double vision or yellow sclerae.No hearing loss, sneezing, congestion, runny nose or sore throat.  SKIN: No rash or itching.  CARDIOVASCULAR: per hpi RESPIRATORY: No shortness of breath, cough or sputum.  GASTROINTESTINAL: No anorexia, nausea, vomiting or diarrhea. No abdominal pain or blood.  GENITOURINARY: No burning on urination, no polyuria NEUROLOGICAL: No headache, dizziness, syncope, paralysis, ataxia, numbness or tingling in the extremities. No change in bowel or bladder control.  MUSCULOSKELETAL: No muscle, back pain, joint pain or stiffness.  LYMPHATICS: No enlarged nodes. No history of splenectomy.  PSYCHIATRIC: No history of depression or anxiety.  ENDOCRINOLOGIC: No reports of sweating, cold or heat intolerance. No polyuria or polydipsia.  Marland Kitchen   Physical Examination Today's Vitals   06/09/21 1445  BP: 120/60  Pulse: 68  Weight: 213 lb 12.8 oz (97 kg)  Height: 5\' 9"  (1.753 m)   Body mass index is 31.57 kg/m.  Gen: resting comfortably, no acute distress HEENT: no scleral icterus, pupils equal round and reactive, no palptable cervical adenopathy,  CV: RRR, no m/r/g, no jvd Resp: Clear to auscultation bilaterally GI: abdomen is soft, non-tender, non-distended, normal bowel sounds, no hepatosplenomegaly MSK: extremities are warm, no edema.  Skin: warm, no rash Neuro:  no focal deficits Psych: appropriate affect   Diagnostic Studies  07/2019 echo 1. The left ventricle has normal systolic function, with an ejection fraction of 55-60%. The cavity size was normal. There is moderately  increased left ventricular wall thickness. Left ventricular diastolic Doppler parameters are indeterminate.  2. The right ventricle has mildly reduced systolic function. The cavity was moderately enlarged. There is not assessed.  3. The ventricular septum flattens in systole consistent with RV pressure overload.  4. Left atrial size was severely dilated.  5. Right atrial size was mildly dilated.  6. Mitral valve regurgitation mild to moderate MR, the jet is eccentric and may be underestimated. No evidence of mitral valve stenosis.  7. The aortic valve was not well visualized. No stenosis of the aortic valve.  8. [facility] Ease Pericardial Tissue Valve (size 46mm, model # 3300TFX is in the AV position.  9. The aorta is normal unless otherwise noted. 10. The aortic root is normal in size and structure. 11. Pulmonary hypertension is indeterminate, inadequate TR jet. 12. The inferior vena cava was dilated in size with >50% respiratory variability.   Assessment and Plan  1. Aortic stenosis s/p AVR - doing well without symptoms, continue to monitor   2. CAD - no symptoms, continue current meds   3. Afib -no significant symptoms, continue metoprolol and coumadin.    F/u 6 months  Arnoldo Lenis, M.D.

## 2021-06-16 ENCOUNTER — Other Ambulatory Visit: Payer: Self-pay

## 2021-06-16 ENCOUNTER — Ambulatory Visit (INDEPENDENT_AMBULATORY_CARE_PROVIDER_SITE_OTHER): Payer: Medicare Other | Admitting: *Deleted

## 2021-06-16 DIAGNOSIS — I4891 Unspecified atrial fibrillation: Secondary | ICD-10-CM

## 2021-06-16 DIAGNOSIS — Z5181 Encounter for therapeutic drug level monitoring: Secondary | ICD-10-CM

## 2021-06-16 DIAGNOSIS — I82603 Acute embolism and thrombosis of unspecified veins of upper extremity, bilateral: Secondary | ICD-10-CM

## 2021-06-16 DIAGNOSIS — I9789 Other postprocedural complications and disorders of the circulatory system, not elsewhere classified: Secondary | ICD-10-CM

## 2021-06-16 DIAGNOSIS — Z9889 Other specified postprocedural states: Secondary | ICD-10-CM | POA: Diagnosis not present

## 2021-06-16 DIAGNOSIS — Q231 Congenital insufficiency of aortic valve: Secondary | ICD-10-CM

## 2021-06-16 LAB — POCT INR: INR: 2.2 (ref 2.0–3.0)

## 2021-06-16 NOTE — Patient Instructions (Signed)
Continue warfarin 2 tablets daily  Continue Vit K foods Recheck 6 wks

## 2021-06-22 DIAGNOSIS — E1142 Type 2 diabetes mellitus with diabetic polyneuropathy: Secondary | ICD-10-CM | POA: Diagnosis not present

## 2021-06-22 DIAGNOSIS — I482 Chronic atrial fibrillation, unspecified: Secondary | ICD-10-CM | POA: Diagnosis not present

## 2021-06-22 DIAGNOSIS — I10 Essential (primary) hypertension: Secondary | ICD-10-CM | POA: Diagnosis not present

## 2021-06-22 DIAGNOSIS — Z7984 Long term (current) use of oral hypoglycemic drugs: Secondary | ICD-10-CM | POA: Diagnosis not present

## 2021-07-03 DIAGNOSIS — E7849 Other hyperlipidemia: Secondary | ICD-10-CM | POA: Diagnosis not present

## 2021-07-03 DIAGNOSIS — E1142 Type 2 diabetes mellitus with diabetic polyneuropathy: Secondary | ICD-10-CM | POA: Diagnosis not present

## 2021-07-03 DIAGNOSIS — E1121 Type 2 diabetes mellitus with diabetic nephropathy: Secondary | ICD-10-CM | POA: Diagnosis not present

## 2021-07-03 DIAGNOSIS — E782 Mixed hyperlipidemia: Secondary | ICD-10-CM | POA: Diagnosis not present

## 2021-07-03 DIAGNOSIS — E7801 Familial hypercholesterolemia: Secondary | ICD-10-CM | POA: Diagnosis not present

## 2021-07-03 DIAGNOSIS — E1151 Type 2 diabetes mellitus with diabetic peripheral angiopathy without gangrene: Secondary | ICD-10-CM | POA: Diagnosis not present

## 2021-07-03 DIAGNOSIS — I10 Essential (primary) hypertension: Secondary | ICD-10-CM | POA: Diagnosis not present

## 2021-07-03 DIAGNOSIS — E7800 Pure hypercholesterolemia, unspecified: Secondary | ICD-10-CM | POA: Diagnosis not present

## 2021-07-03 DIAGNOSIS — K2100 Gastro-esophageal reflux disease with esophagitis, without bleeding: Secondary | ICD-10-CM | POA: Diagnosis not present

## 2021-07-09 DIAGNOSIS — N401 Enlarged prostate with lower urinary tract symptoms: Secondary | ICD-10-CM | POA: Diagnosis not present

## 2021-07-09 DIAGNOSIS — E1140 Type 2 diabetes mellitus with diabetic neuropathy, unspecified: Secondary | ICD-10-CM | POA: Diagnosis not present

## 2021-07-09 DIAGNOSIS — G2581 Restless legs syndrome: Secondary | ICD-10-CM | POA: Diagnosis not present

## 2021-07-09 DIAGNOSIS — Z8679 Personal history of other diseases of the circulatory system: Secondary | ICD-10-CM | POA: Diagnosis not present

## 2021-07-09 DIAGNOSIS — I10 Essential (primary) hypertension: Secondary | ICD-10-CM | POA: Diagnosis not present

## 2021-07-09 DIAGNOSIS — M1A9XX0 Chronic gout, unspecified, without tophus (tophi): Secondary | ICD-10-CM | POA: Diagnosis not present

## 2021-07-09 DIAGNOSIS — E7849 Other hyperlipidemia: Secondary | ICD-10-CM | POA: Diagnosis not present

## 2021-07-09 DIAGNOSIS — Z6831 Body mass index (BMI) 31.0-31.9, adult: Secondary | ICD-10-CM | POA: Diagnosis not present

## 2021-07-29 ENCOUNTER — Other Ambulatory Visit: Payer: Self-pay

## 2021-07-29 ENCOUNTER — Ambulatory Visit (INDEPENDENT_AMBULATORY_CARE_PROVIDER_SITE_OTHER): Payer: Medicare Other | Admitting: *Deleted

## 2021-07-29 DIAGNOSIS — Z5181 Encounter for therapeutic drug level monitoring: Secondary | ICD-10-CM | POA: Diagnosis not present

## 2021-07-29 DIAGNOSIS — I4891 Unspecified atrial fibrillation: Secondary | ICD-10-CM

## 2021-07-29 DIAGNOSIS — I82603 Acute embolism and thrombosis of unspecified veins of upper extremity, bilateral: Secondary | ICD-10-CM | POA: Diagnosis not present

## 2021-07-29 DIAGNOSIS — Z9889 Other specified postprocedural states: Secondary | ICD-10-CM | POA: Diagnosis not present

## 2021-07-29 DIAGNOSIS — Q231 Congenital insufficiency of aortic valve: Secondary | ICD-10-CM

## 2021-07-29 DIAGNOSIS — I9789 Other postprocedural complications and disorders of the circulatory system, not elsewhere classified: Secondary | ICD-10-CM

## 2021-07-29 LAB — POCT INR: INR: 2.3 (ref 2.0–3.0)

## 2021-07-29 NOTE — Patient Instructions (Signed)
Continue warfarin 2 tablets daily  Continue Vit K foods Recheck 6 wks

## 2021-08-22 DIAGNOSIS — I10 Essential (primary) hypertension: Secondary | ICD-10-CM | POA: Diagnosis not present

## 2021-08-22 DIAGNOSIS — E1142 Type 2 diabetes mellitus with diabetic polyneuropathy: Secondary | ICD-10-CM | POA: Diagnosis not present

## 2021-08-22 DIAGNOSIS — I482 Chronic atrial fibrillation, unspecified: Secondary | ICD-10-CM | POA: Diagnosis not present

## 2021-08-22 DIAGNOSIS — Z7984 Long term (current) use of oral hypoglycemic drugs: Secondary | ICD-10-CM | POA: Diagnosis not present

## 2021-09-02 ENCOUNTER — Telehealth: Payer: Self-pay | Admitting: Cardiology

## 2021-09-02 MED ORDER — METOPROLOL SUCCINATE ER 50 MG PO TB24
50.0000 mg | ORAL_TABLET | Freq: Two times a day (BID) | ORAL | 2 refills | Status: DC
Start: 2021-09-02 — End: 2021-12-05

## 2021-09-02 NOTE — Telephone Encounter (Signed)
Pt c/o medication issue:  1. Name of Medication: metoprolol succinate (TOPROL-XL) 50 MG 24 hr tablet  2. How are you currently taking this medication (dosage and times per day)? As directed  3. Are you having a reaction (difficulty breathing--STAT)? no  4. What is your medication issue? [facility] had to give patient a 10 day emergency supply of toprol

## 2021-09-09 ENCOUNTER — Ambulatory Visit (INDEPENDENT_AMBULATORY_CARE_PROVIDER_SITE_OTHER): Payer: Medicare Other | Admitting: *Deleted

## 2021-09-09 DIAGNOSIS — Q231 Congenital insufficiency of aortic valve: Secondary | ICD-10-CM | POA: Diagnosis not present

## 2021-09-09 DIAGNOSIS — I9789 Other postprocedural complications and disorders of the circulatory system, not elsewhere classified: Secondary | ICD-10-CM

## 2021-09-09 DIAGNOSIS — Z23 Encounter for immunization: Secondary | ICD-10-CM | POA: Diagnosis not present

## 2021-09-09 DIAGNOSIS — I82603 Acute embolism and thrombosis of unspecified veins of upper extremity, bilateral: Secondary | ICD-10-CM | POA: Diagnosis not present

## 2021-09-09 DIAGNOSIS — I4891 Unspecified atrial fibrillation: Secondary | ICD-10-CM | POA: Diagnosis not present

## 2021-09-09 DIAGNOSIS — Z9889 Other specified postprocedural states: Secondary | ICD-10-CM

## 2021-09-09 DIAGNOSIS — Z953 Presence of xenogenic heart valve: Secondary | ICD-10-CM

## 2021-09-09 DIAGNOSIS — Z5181 Encounter for therapeutic drug level monitoring: Secondary | ICD-10-CM

## 2021-09-09 LAB — POCT INR: INR: 2.7 (ref 2.0–3.0)

## 2021-09-09 NOTE — Patient Instructions (Signed)
Continue warfarin 2 tablets daily  Continue Vit K foods Recheck 6 wks

## 2021-10-21 ENCOUNTER — Ambulatory Visit (INDEPENDENT_AMBULATORY_CARE_PROVIDER_SITE_OTHER): Payer: Medicare Other | Admitting: *Deleted

## 2021-10-21 DIAGNOSIS — Q231 Congenital insufficiency of aortic valve: Secondary | ICD-10-CM | POA: Diagnosis not present

## 2021-10-21 DIAGNOSIS — I4891 Unspecified atrial fibrillation: Secondary | ICD-10-CM | POA: Diagnosis not present

## 2021-10-21 DIAGNOSIS — Z9889 Other specified postprocedural states: Secondary | ICD-10-CM

## 2021-10-21 DIAGNOSIS — Z5181 Encounter for therapeutic drug level monitoring: Secondary | ICD-10-CM | POA: Diagnosis not present

## 2021-10-21 DIAGNOSIS — I9789 Other postprocedural complications and disorders of the circulatory system, not elsewhere classified: Secondary | ICD-10-CM | POA: Diagnosis not present

## 2021-10-21 DIAGNOSIS — I82603 Acute embolism and thrombosis of unspecified veins of upper extremity, bilateral: Secondary | ICD-10-CM

## 2021-10-21 LAB — POCT INR: INR: 2.4 (ref 2.0–3.0)

## 2021-10-21 NOTE — Patient Instructions (Signed)
Continue warfarin 2 tablets daily  Continue Vit K foods Recheck 6 wks

## 2021-10-22 DIAGNOSIS — E1142 Type 2 diabetes mellitus with diabetic polyneuropathy: Secondary | ICD-10-CM | POA: Diagnosis not present

## 2021-10-22 DIAGNOSIS — Z7984 Long term (current) use of oral hypoglycemic drugs: Secondary | ICD-10-CM | POA: Diagnosis not present

## 2021-10-22 DIAGNOSIS — I482 Chronic atrial fibrillation, unspecified: Secondary | ICD-10-CM | POA: Diagnosis not present

## 2021-10-22 DIAGNOSIS — I10 Essential (primary) hypertension: Secondary | ICD-10-CM | POA: Diagnosis not present

## 2021-11-03 ENCOUNTER — Other Ambulatory Visit: Payer: Self-pay | Admitting: Cardiology

## 2021-11-03 DIAGNOSIS — E781 Pure hyperglyceridemia: Secondary | ICD-10-CM | POA: Diagnosis not present

## 2021-11-03 DIAGNOSIS — E1121 Type 2 diabetes mellitus with diabetic nephropathy: Secondary | ICD-10-CM | POA: Diagnosis not present

## 2021-11-03 DIAGNOSIS — K2100 Gastro-esophageal reflux disease with esophagitis, without bleeding: Secondary | ICD-10-CM | POA: Diagnosis not present

## 2021-11-03 DIAGNOSIS — E7801 Familial hypercholesterolemia: Secondary | ICD-10-CM | POA: Diagnosis not present

## 2021-11-03 DIAGNOSIS — I10 Essential (primary) hypertension: Secondary | ICD-10-CM | POA: Diagnosis not present

## 2021-11-03 DIAGNOSIS — E782 Mixed hyperlipidemia: Secondary | ICD-10-CM | POA: Diagnosis not present

## 2021-11-03 DIAGNOSIS — E7800 Pure hypercholesterolemia, unspecified: Secondary | ICD-10-CM | POA: Diagnosis not present

## 2021-11-03 DIAGNOSIS — N401 Enlarged prostate with lower urinary tract symptoms: Secondary | ICD-10-CM | POA: Diagnosis not present

## 2021-11-03 DIAGNOSIS — E1159 Type 2 diabetes mellitus with other circulatory complications: Secondary | ICD-10-CM | POA: Diagnosis not present

## 2021-11-03 DIAGNOSIS — E1142 Type 2 diabetes mellitus with diabetic polyneuropathy: Secondary | ICD-10-CM | POA: Diagnosis not present

## 2021-11-03 DIAGNOSIS — E7849 Other hyperlipidemia: Secondary | ICD-10-CM | POA: Diagnosis not present

## 2021-11-06 DIAGNOSIS — Z1389 Encounter for screening for other disorder: Secondary | ICD-10-CM | POA: Diagnosis not present

## 2021-11-06 DIAGNOSIS — E7849 Other hyperlipidemia: Secondary | ICD-10-CM | POA: Diagnosis not present

## 2021-11-06 DIAGNOSIS — N401 Enlarged prostate with lower urinary tract symptoms: Secondary | ICD-10-CM | POA: Diagnosis not present

## 2021-11-06 DIAGNOSIS — E1140 Type 2 diabetes mellitus with diabetic neuropathy, unspecified: Secondary | ICD-10-CM | POA: Diagnosis not present

## 2021-11-06 DIAGNOSIS — Z8679 Personal history of other diseases of the circulatory system: Secondary | ICD-10-CM | POA: Diagnosis not present

## 2021-11-06 DIAGNOSIS — I10 Essential (primary) hypertension: Secondary | ICD-10-CM | POA: Diagnosis not present

## 2021-11-06 DIAGNOSIS — Z1331 Encounter for screening for depression: Secondary | ICD-10-CM | POA: Diagnosis not present

## 2021-11-06 DIAGNOSIS — M1A9XX0 Chronic gout, unspecified, without tophus (tophi): Secondary | ICD-10-CM | POA: Diagnosis not present

## 2021-12-02 ENCOUNTER — Ambulatory Visit (INDEPENDENT_AMBULATORY_CARE_PROVIDER_SITE_OTHER): Payer: Medicare Other | Admitting: *Deleted

## 2021-12-02 ENCOUNTER — Other Ambulatory Visit: Payer: Self-pay

## 2021-12-02 DIAGNOSIS — Z9889 Other specified postprocedural states: Secondary | ICD-10-CM

## 2021-12-02 DIAGNOSIS — Z5181 Encounter for therapeutic drug level monitoring: Secondary | ICD-10-CM

## 2021-12-02 DIAGNOSIS — I9789 Other postprocedural complications and disorders of the circulatory system, not elsewhere classified: Secondary | ICD-10-CM | POA: Diagnosis not present

## 2021-12-02 DIAGNOSIS — I82603 Acute embolism and thrombosis of unspecified veins of upper extremity, bilateral: Secondary | ICD-10-CM

## 2021-12-02 DIAGNOSIS — Q231 Congenital insufficiency of aortic valve: Secondary | ICD-10-CM

## 2021-12-02 DIAGNOSIS — I4891 Unspecified atrial fibrillation: Secondary | ICD-10-CM | POA: Diagnosis not present

## 2021-12-02 LAB — POCT INR: INR: 2.9 (ref 2.0–3.0)

## 2021-12-02 NOTE — Patient Instructions (Signed)
Continue warfarin 2 tablets daily  Continue Vit K foods Recheck 6 wks

## 2021-12-05 ENCOUNTER — Ambulatory Visit (INDEPENDENT_AMBULATORY_CARE_PROVIDER_SITE_OTHER): Payer: Medicare Other | Admitting: Cardiology

## 2021-12-05 ENCOUNTER — Encounter: Payer: Self-pay | Admitting: Cardiology

## 2021-12-05 ENCOUNTER — Other Ambulatory Visit: Payer: Self-pay

## 2021-12-05 VITALS — BP 100/62 | HR 98 | Ht 69.0 in | Wt 215.8 lb

## 2021-12-05 DIAGNOSIS — R0602 Shortness of breath: Secondary | ICD-10-CM

## 2021-12-05 DIAGNOSIS — D6869 Other thrombophilia: Secondary | ICD-10-CM

## 2021-12-05 DIAGNOSIS — I4891 Unspecified atrial fibrillation: Secondary | ICD-10-CM | POA: Diagnosis not present

## 2021-12-05 DIAGNOSIS — Q231 Congenital insufficiency of aortic valve: Secondary | ICD-10-CM

## 2021-12-05 DIAGNOSIS — Z952 Presence of prosthetic heart valve: Secondary | ICD-10-CM | POA: Diagnosis not present

## 2021-12-05 DIAGNOSIS — I2510 Atherosclerotic heart disease of native coronary artery without angina pectoris: Secondary | ICD-10-CM | POA: Diagnosis not present

## 2021-12-05 MED ORDER — METOPROLOL SUCCINATE ER 50 MG PO TB24
75.0000 mg | ORAL_TABLET | Freq: Two times a day (BID) | ORAL | 3 refills | Status: DC
Start: 2021-12-05 — End: 2021-12-24

## 2021-12-05 NOTE — Progress Notes (Signed)
Clinical Summary Mr. Hubbert is a 76 y.o.male seen today for follow up of the following medical problems.    1. Aortic stenosis - history of bicuspid AV - history of AVR 10/22/2016,  [facility] Ease Pericardial Tissue Valve (size 93mm, model # 3300TFX, serial # [id number]) along with 3 vessel CABG, MV repair, and repair of ascending aortic aneurysm     - OR on 10/22/16 for CABG X 3, AVR, MVR, repair of thoracic ascending aneurysm with placement of VAC on open chest Post op course significant for cardiogenic shock requiring multiple pressors, SAM requiring removal of mitral annuloplasty ring,  persistent fevers, encephalopathy, shocked liver, DIC due to consumptive intraoperative coagulopathy, diffuse clotting with ischemia of distal BUE/BLE extremities, A fib, C diff colitis, volume overload and VDRF with difficulty with vent wean requiring tracheostomy.  Sternal wound closed on 12/4 and was started on coumadin for RV thrombus and A fib.     [facility] course significant for MRSA bacteremia, pneumonitis, intermittent fevers, dysphagia requiring PEG tube placement on 1/15 by Dr. Kathlene Cote. He developed progressive gangrenous changes of BUE and BLE requiring B-transtibial amputation , left transmetacarpal amputation and Right MCP amputation of right hand by Dr. Sharol Given on 1/19.    01/2017 echo: 55-60%, no WMAs, grade II diastolic dysfunction. Normal functioning AV.   07/2019 echo: LVEF 55-60%, mild RV dysfunction, mild to mod MR     -some recent SOB/DOE. Started 2-3 weeks ago  - compliant with meds. Some mild orthopnea. Compliant with torsemide - some cough, nonproductive. No fever chills - no chest pains.          2. Mitral regurgitation - patient had MV ring placed during his surgery 10/22/16 along with AVR, ascending thoracic aneurysm repair, and CABG. - ring was subsequently removed during that operation due to significant SAM - 02/15/17 echo shows only mild MR      3. CAD -  history of CABG 10/22/2016 (LIMA-Diag and distal LAD, SVG-LAD) 01/2017 echo: 55-60%, no WMAs, grade II diastolic dysfunction. Normal functioning AV.     - no recent chest pain   4. Ischemic limbs - occurred in setting of cardiogenic shock requiring high dose pressors as well as DIC  - required amputation - followed by ortho. - has been on coumadin, ASA   5.Afib/aflutter - initally had post op afib - noted to be in aflutter at clinical f/u and thus was committed to long term anticoagulation  08/2019 monitor no significant arrhytmias   - some palpitato      6. Hyperlipidemia - 02/2021 TC 99 TG 227 HDL 30 LDL 33 - he is on atorvastatin   Past Medical History:  Diagnosis Date   Arthritis    "hips, shoulders; knees; back" (10/20/2016)   Bicuspid aortic valve    BPH (benign prostatic hypertrophy)    Carpal tunnel syndrome of right wrist    Coronary artery disease involving native coronary artery of native heart with unstable angina pectoris ([place]) 10/20/2016   Diverticulitis    GERD (gastroesophageal reflux disease)    Gout    Heart murmur    Hyperlipemia    Hypertension    Postoperative atrial fibrillation ([place]) 10/24/2016   RLS (restless legs syndrome)    S/P aortic valve replacement with bioprosthetic valve 10/22/2016   25 mm [facility] Ease bovine pericardial bioprosthetic tissue valve   S/P ascending aortic aneurysm repair 10/22/2016   28 mm supracoronary straight graft replacement of ascending thoracic aortic aneurysm  S/P CABG x 3 10/22/2016   Sequential LIMA to Diag and LAD, SVG to distal LAD, open vein harvest right thigh   S/P mitral valve repair 10/22/2016   Artificial Gore-tex neochord placement x6 - posterior annuloplasty band placed but removed due to systolic anterior motion of mitral valve   Severe aortic stenosis    Snores    Never been tested for sleep apnea   Thoracic ascending aortic aneurysm ([place]) 10/21/2016   Thrombocytopenia ([place]) 10/22/2016    Type II diabetes mellitus (HCC)      Allergies  Allergen Reactions   Sulfa Antibiotics Hives     Current Outpatient Medications  Medication Sig Dispense Refill   allopurinol (ZYLOPRIM) 300 MG tablet Take 300 mg by mouth daily.     aspirin EC 81 MG tablet Take 81 mg by mouth daily.     atorvastatin (LIPITOR) 40 MG tablet TAKE 1 TABLET DAILY AT 6PM 90 tablet 3   KLOR-CON M20 20 MEQ tablet TAKE 1 TABLET TWICE A DAY 180 tablet 3   lisinopril (ZESTRIL) 2.5 MG tablet TAKE 1 TABLET DAILY 90 tablet 3   Magnesium Oxide (MAG-OX 400 PO) Take 400 mg by mouth daily.     Melatonin 10 MG CAPS Take 10 mg by mouth at bedtime.      metFORMIN (GLUCOPHAGE) 500 MG tablet Take 500 mg by mouth daily with breakfast. & 1000 mg by mouth in the evening     metoprolol succinate (TOPROL-XL) 50 MG 24 hr tablet Take 1 tablet (50 mg total) by mouth 2 (two) times daily. Take with or immediately following a meal. 180 tablet 2   nitroGLYCERIN (NITROSTAT) 0.4 MG SL tablet Place 1 tablet (0.4 mg total) under the tongue every 5 (five) minutes as needed. 25 tablet 3   rOPINIRole (REQUIP) 2 MG tablet Take 2 mg by mouth See admin instructions. Takes 2mg  at 4:30pm and 2mg  at 9:30pm.     tamsulosin (FLOMAX) 0.4 MG CAPS capsule Take 0.4 mg by mouth daily.     torsemide (DEMADEX) 20 MG tablet TAKE 2 TABLETS EVERY MORNING AND 1 TABLET EVERY EVENING. MAY TAKE ADDITIONAL 1 TABLET AS NEEDED 270 tablet 2   warfarin (COUMADIN) 2 MG tablet TAKE 2 TABLETS BY MOUTH DAILY OR AS DIRECTED 180 tablet 2   No current facility-administered medications for this visit.     Past Surgical History:  Procedure Laterality Date   AMPUTATION Bilateral 12/11/2016   Procedure: AMPUTATION BELOW KNEE BILATERALLY;  Surgeon: [name], MD;  Location: [place];  Service: Orthopedics;  Laterality: Bilateral;   AMPUTATION Bilateral 12/11/2016   Procedure: AMPUTATION BILATERAL HANDS EXCEPT RIGHT THUMB;  Surgeon: [name], MD;  Location: [place];  Service:  Orthopedics;  Laterality: Bilateral;   AORTIC VALVE REPLACEMENT N/A 10/22/2016   Procedure: AORTIC VALVE REPLACEMENT (AVR) WITH SIZE 25 MM MAGNA EASE PERICARDIAL BIOPROSTHESIS - AORTIC;  Surgeon: Rexene Alberts, MD;  Location: [place];  Service: Open Heart Surgery;  Laterality: N/A;   BONE EXCISION Right 02/01/2017   Procedure: EXCISION RIGHT INDEX METACARPAL HEAD;  Surgeon: [name], MD;  Location: [place];  Service: Orthopedics;  Laterality: Right;   CARDIAC CATHETERIZATION N/A 10/20/2016   Procedure: Right/Left Heart Cath and Coronary Angiography;  Surgeon: Leonie Man, MD;  Location: [place] [facility];  Service: Cardiovascular;  Laterality: N/A;   CARPAL TUNNEL RELEASE Right 11/28/2013   Procedure: RIGHT WRIST CARPAL TUNNEL RELEASE;  Surgeon: Lorn Junes, MD;  Location:  [place];  Service: Orthopedics;  Laterality: Right;   CATARACT EXTRACTION W/ INTRAOCULAR LENS  IMPLANT, BILATERAL Bilateral 1978   COLONOSCOPY     CORONARY ARTERY BYPASS GRAFT N/A 10/22/2016   Procedure: CORONARY ARTERY BYPASS GRAFTING (CABG)x 2 WITH LIMA TO DIAGONAL, OPEN  HARVESTING OF RIGHT SAPHENOUS VEIN FOR VEIN GRAFT TO LAD;  Surgeon: Rexene Alberts, MD;  Location: [place];  Service: Open Heart Surgery;  Laterality: N/A;   FRACTURE SURGERY     INGUINAL HERNIA REPAIR Right 1998   IR GENERIC HISTORICAL  12/07/2016   IR US GUIDE VASC ACCESS RIGHT 12/07/2016 Aletta Edouard, MD [facility]   IR GENERIC HISTORICAL  12/07/2016   IR RADIOLOGY PERIPHERAL GUIDED IV START 12/07/2016 Aletta Edouard, MD [facility]   IR GENERIC HISTORICAL  12/07/2016   IR GASTROSTOMY TUBE MOD SED 12/07/2016 Aletta Edouard, MD [facility]   LIPOMA EXCISION Right 2008   "side of my head"   MITRAL VALVE REPAIR N/A 10/22/2016   Procedure: MITRAL VALVE REPAIR (MVR) WITH SIZE 30 SORIN ANNULOFLEX ANNULOPLASTY RING WITH SUBSEQUENT REMOVAL OF RING;  Surgeon: Rexene Alberts, MD;  Location: [place];  Service: Open Heart Surgery;   Laterality: N/A;   PENECTOMY  2007   Peyronie's disease    PENILE PROSTHESIS IMPLANT  2009   REMOVAL OF PENILE PROSTHESIS N/A 02/01/2017   Procedure: REMOVAL OF PENILE PROSTHESIS;  Surgeon: Kathie Rhodes, MD;  Location: [place];  Service: Urology;  Laterality: N/A;   SHOULDER OPEN ROTATOR CUFF REPAIR Right 2006   STERNAL CLOSURE N/A 10/26/2016   Procedure: STERNAL WASHOUT AND DELAYED PRIMARY CLOSURE;  Surgeon: Rexene Alberts, MD;  Location: [place];  Service: Thoracic;  Laterality: N/A;   TEE WITHOUT CARDIOVERSION N/A 10/26/2016   Procedure: TRANSESOPHAGEAL ECHOCARDIOGRAM (TEE);  Surgeon: Rexene Alberts, MD;  Location: [place];  Service: Thoracic;  Laterality: N/A;   TEE WITHOUT CARDIOVERSION N/A 10/22/2016   Procedure: TRANSESOPHAGEAL ECHOCARDIOGRAM (TEE);  Surgeon: Rexene Alberts, MD;  Location: [place];  Service: Open Heart Surgery;  Laterality: N/A;   THORACIC AORTIC ANEURYSM REPAIR  10/22/2016   Procedure: ASCENDING AORTIC  ANEURYSM REPAIR (AAA) WITH 28 MM HEMASHIELD PLATINUM WOVEN DOUBLE VELOUR VASCULAR GRAFT;  Surgeon: Rexene Alberts, MD;  Location: [place];  Service: Open Heart Surgery;;   TONSILLECTOMY  ~ [place] N/A 11/09/2016   Procedure: TRACHEOSTOMY;  Surgeon: Rexene Alberts, MD;  Location: [place];  Service: Thoracic;  Laterality: N/A;   TRANSURETHRAL RESECTION OF PROSTATE  2005   TRIGGER FINGER RELEASE Bilateral    several lt and rt hands   TRIGGER FINGER RELEASE Right 11/28/2013   Procedure: RIGHT TRIGGER FINGER  RELEASE (TENDON SHEATH INCISION);  Surgeon: Lorn Junes, MD;  Location: [place];  Service: Orthopedics;  Laterality: Right;   VIDEO BRONCHOSCOPY N/A 11/09/2016   Procedure: VIDEO BRONCHOSCOPY;  Surgeon: Rexene Alberts, MD;  Location: [facility];  Service: Thoracic;  Laterality: N/A;   WRIST FRACTURE SURGERY Right ~ 1959     Allergies  Allergen Reactions   Sulfa Antibiotics Hives      Family History  Problem Relation Age of  Onset   Lung cancer Mother    Clotting disorder Father        No details   Heart disease Sister 75       Stents   Cancer Sister        Throat  Social History Mr. Hitz reports that he quit smoking about 39 years ago. His smoking use included pipe and cigars. He has never used smokeless tobacco. Mr. Lorusso reports current alcohol use of about 10.0 standard drinks per week.   Review of Systems CONSTITUTIONAL: No weight loss, fever, chills, weakness or fatigue.  HEENT: Eyes: No visual loss, blurred vision, double vision or yellow sclerae.No hearing loss, sneezing, congestion, runny nose or sore throat.  SKIN: No rash or itching.  CARDIOVASCULAR: per hpi RESPIRATORY: per hpi GASTROINTESTINAL: No anorexia, nausea, vomiting or diarrhea. No abdominal pain or blood.  GENITOURINARY: No burning on urination, no polyuria NEUROLOGICAL: No headache, dizziness, syncope, paralysis, ataxia, numbness or tingling in the extremities. No change in bowel or bladder control.  LYMPHATICS: No enlarged nodes. No history of splenectomy.  PSYCHIATRIC: No history of depression or anxiety.  ENDOCRINOLOGIC: No reports of sweating, cold or heat intolerance. No polyuria or polydipsia.  Marland Kitchen   Physical Examination Today's Vitals   12/05/21 1440  BP: 100/62  Pulse: 98  SpO2: 96%  Weight: 215 lb 12.8 oz (97.9 kg)  Height: 5\' 9"  (1.753 m)   Body mass index is 31.87 kg/m.  Gen: resting comfortably, no acute distress HEENT: no scleral icterus, pupils equal round and reactive, no palptable cervical adenopathy,  CV: irreg, 2/6 systolic murmur rusb, elevated JVD Resp: Clear to auscultation bilaterally GI: abdomen is soft, non-tender, non-distended, normal bowel sounds, no hepatosplenomegaly MSK: extremities are warm, no edema.  Skin: warm, no rash Neuro:  no focal deficits Psych: appropriate affect   Diagnostic Studies  07/2019 echo 1. The left ventricle has normal systolic function, with an ejection  fraction of 55-60%. The cavity size was normal. There is moderately increased left ventricular wall thickness. Left ventricular diastolic Doppler parameters are indeterminate.  2. The right ventricle has mildly reduced systolic function. The cavity was moderately enlarged. There is not assessed.  3. The ventricular septum flattens in systole consistent with RV pressure overload.  4. Left atrial size was severely dilated.  5. Right atrial size was mildly dilated.  6. Mitral valve regurgitation mild to moderate MR, the jet is eccentric and may be underestimated. No evidence of mitral valve stenosis.  7. The aortic valve was not well visualized. No stenosis of the aortic valve.  8. [facility] Ease Pericardial Tissue Valve (size 17mm, model # 3300TFX is in the AV position.  9. The aorta is normal unless otherwise noted. 10. The aortic root is normal in size and structure. 11. Pulmonary hypertension is indeterminate, inadequate TR jet. 12. The inferior vena cava was dilated in size with >50% respiratory variability.   Assessment and Plan  1. Aortic stenosis s/p AVR - some recent SOB/DOE - will repeat echo, get CXR - some signs of fluid overload, increase torsemide to 40mg  bid over the weekend then back to 40mg  in AM and 20mg  in pm   2. CAD - no recenty symptoms, continue current meds   3. PAF -ekg today shows afib rate 93, may be playing some role in his SOB - increase toprol to 75mg  bid.      Arnoldo Lenis, M.D.

## 2021-12-05 NOTE — Patient Instructions (Signed)
Medication Instructions:  Your physician has recommended you make the following change in your medication:  INCREASE Toprol-XL to 75 mg twice daiy TAKE Torsemide 40 mg twice daily for two (2) days, then go back to prior dosing  *If you need a refill on your cardiac medications before your next appointment, please call your pharmacy*   Lab Work: None If you have labs (blood work) drawn today and your tests are completely normal, you will receive your results only by: [place] (if you have MyChart) OR A paper copy in the mail If you have any lab test that is abnormal or we need to change your treatment, we will call you to review the results.   Testing/Procedures: Your physician has requested that you have an echocardiogram. Echocardiography is a painless test that uses sound waves to create images of your heart. It provides your doctor with information about the size and shape of your heart and how well your hearts chambers and valves are working. This procedure takes approximately one hour. There are no restrictions for this procedure.  Chest X-Ray   Follow-Up: At [facility], you and your health needs are our priority.  As part of our continuing mission to provide you with exceptional heart care, we have created designated Provider Care Teams.  These Care Teams include your primary Cardiologist (physician) and Advanced Practice Providers (APPs -  Physician Assistants and Nurse Practitioners) who all work together to provide you with the care you need, when you need it.  We recommend signing up for the patient portal called "MyChart".  Sign up information is provided on this After Visit Summary.  MyChart is used to connect with patients for Virtual Visits (Telemedicine).  Patients are able to view lab/test results, encounter notes, upcoming appointments, etc.  Non-urgent messages can be sent to your provider as well.   To learn more about what you can do with MyChart, go to  [email or web address].    Your next appointment:   6 week(s)  The format for your next appointment:   In Person  Provider:   Carlyle Dolly, MD    Other Instructions

## 2021-12-10 ENCOUNTER — Ambulatory Visit (INDEPENDENT_AMBULATORY_CARE_PROVIDER_SITE_OTHER): Payer: Medicare Other

## 2021-12-10 ENCOUNTER — Other Ambulatory Visit: Payer: Self-pay

## 2021-12-10 DIAGNOSIS — I517 Cardiomegaly: Secondary | ICD-10-CM | POA: Diagnosis not present

## 2021-12-10 DIAGNOSIS — R0602 Shortness of breath: Secondary | ICD-10-CM | POA: Diagnosis not present

## 2021-12-10 DIAGNOSIS — J90 Pleural effusion, not elsewhere classified: Secondary | ICD-10-CM | POA: Diagnosis not present

## 2021-12-10 LAB — ECHOCARDIOGRAM COMPLETE
AR max vel: 2.46 cm2
AV Area VTI: 2.42 cm2
AV Area mean vel: 2.33 cm2
AV Mean grad: 5 mmHg
AV Peak grad: 10.8 mmHg
Ao pk vel: 1.64 m/s
Area-P 1/2: 3.6 cm2
MV M vel: 3.44 m/s
MV Peak grad: 47.3 mmHg
MV VTI: 1.65 cm2
S' Lateral: 3.54 cm

## 2021-12-11 ENCOUNTER — Telehealth: Payer: Self-pay | Admitting: Cardiology

## 2021-12-11 DIAGNOSIS — I4891 Unspecified atrial fibrillation: Secondary | ICD-10-CM

## 2021-12-11 DIAGNOSIS — R0602 Shortness of breath: Secondary | ICD-10-CM

## 2021-12-11 DIAGNOSIS — Z79899 Other long term (current) drug therapy: Secondary | ICD-10-CM

## 2021-12-11 MED ORDER — TORSEMIDE 20 MG PO TABS: 40.0000 mg | ORAL_TABLET | Freq: Two times a day (BID) | ORAL | 1 refills | Status: DC

## 2021-12-11 NOTE — Telephone Encounter (Signed)
Patient would like results to his echo.  Pt c/o Shortness Of Breath: STAT if SOB developed within the last 24 hours or pt is noticeably SOB on the phone  1. Are you currently SOB (can you hear that pt is SOB on the phone)? no  2. How long have you been experiencing SOB? 2-3 weeks, states it is getting worse  3. Are you SOB when sitting or when up moving around? both  4. Are you currently experiencing any other symptoms? No other symptoms.   He would like to be seen, now that he has completed all the test Dr. Harl Bowie has ordered for him.

## 2021-12-11 NOTE — Telephone Encounter (Signed)
-----   Message from Merlene Laughter, RN sent at 12/11/2021  3:59 PM EST -----  ----- Message ----- From: Arnoldo Lenis, MD Sent: 12/11/2021   3:57 PM EST To: Merlene Laughter, RN  Echo shows heart pumping function is normal, his artificial heart valve looks good. There is some suggestion he may have built up some extral fluid based on the echo. Has his breathing improved any when he increased his fluid pill over the weekend? The other issue was he as back in afib at our last visit and we icnreased his toprol. Any improvement in symptoms at all? We may need to get more fluid off and may need to get him out of afib if no improvement  Zandra Abts MD

## 2021-12-11 NOTE — Telephone Encounter (Signed)
Just resulted to your inbox  Scott Abts MD

## 2021-12-11 NOTE — Telephone Encounter (Signed)
Patient informed. Copy sent to PCP  Reports a little improvement after taking fluid medication over the weekend but he is still SOB.  Reports currently taking torsemide 40 mg in the morning and 20 mg in the evening and 1 extra daily as needed Says he hasn't noticed any difference with toprol xl to 75 mg BID Has not been checking vitals at home Lost 5 lbs weight today is 210 lbs

## 2021-12-11 NOTE — Telephone Encounter (Signed)
Patient informed and verbalized understanding of plan. Lab orders faxed to [facility]

## 2021-12-11 NOTE — Telephone Encounter (Signed)
Increase torsemide again to 40mg  bid. Needs bmet/mg/bnp on Monday. Can add him on 2pm Wed Feb 1 in [place] MD

## 2021-12-15 DIAGNOSIS — R0602 Shortness of breath: Secondary | ICD-10-CM | POA: Diagnosis not present

## 2021-12-15 DIAGNOSIS — I4891 Unspecified atrial fibrillation: Secondary | ICD-10-CM | POA: Diagnosis not present

## 2021-12-16 ENCOUNTER — Telehealth: Payer: Self-pay | Admitting: Cardiology

## 2021-12-16 NOTE — Telephone Encounter (Signed)
Patient's wife called for his lab results.

## 2021-12-16 NOTE — Telephone Encounter (Signed)
Pending review by provider

## 2021-12-17 NOTE — Telephone Encounter (Signed)
-----   Message from Arnoldo Lenis, MD sent at 12/16/2021  2:35 PM EST ----- Labs look ok other than continue to show some extra fluid has built up. Any improvement on the higher torsemide dose since we last were udpated  Zandra Abts MD

## 2021-12-17 NOTE — Telephone Encounter (Signed)
Patient informed. Copy sent to PCP Reports not much improvement on higher dose of torsemide

## 2021-12-17 NOTE — Telephone Encounter (Signed)
-----   Message from Arnoldo Lenis, MD sent at 12/17/2021  2:52 PM EST ----- I would take torsemide 40mg  bid until our f/u next week  Zandra Abts MD

## 2021-12-18 ENCOUNTER — Ambulatory Visit: Payer: Medicare Other | Admitting: Cardiology

## 2021-12-24 ENCOUNTER — Other Ambulatory Visit: Payer: Self-pay

## 2021-12-24 ENCOUNTER — Encounter: Payer: Self-pay | Admitting: Cardiology

## 2021-12-24 ENCOUNTER — Ambulatory Visit (INDEPENDENT_AMBULATORY_CARE_PROVIDER_SITE_OTHER): Payer: Medicare Other | Admitting: Cardiology

## 2021-12-24 VITALS — BP 130/70 | HR 95 | Ht 69.0 in | Wt 215.4 lb

## 2021-12-24 DIAGNOSIS — I5033 Acute on chronic diastolic (congestive) heart failure: Secondary | ICD-10-CM | POA: Diagnosis not present

## 2021-12-24 DIAGNOSIS — R0602 Shortness of breath: Secondary | ICD-10-CM | POA: Diagnosis not present

## 2021-12-24 DIAGNOSIS — I4891 Unspecified atrial fibrillation: Secondary | ICD-10-CM

## 2021-12-24 DIAGNOSIS — I2510 Atherosclerotic heart disease of native coronary artery without angina pectoris: Secondary | ICD-10-CM | POA: Diagnosis not present

## 2021-12-24 MED ORDER — TORSEMIDE 20 MG PO TABS
60.0000 mg | ORAL_TABLET | Freq: Two times a day (BID) | ORAL | 3 refills | Status: DC
Start: 2021-12-24 — End: 2022-01-02

## 2021-12-24 MED ORDER — METOPROLOL SUCCINATE ER 100 MG PO TB24
100.0000 mg | ORAL_TABLET | Freq: Every day | ORAL | 3 refills | Status: DC
Start: 2021-12-24 — End: 2022-01-06

## 2021-12-24 NOTE — Progress Notes (Signed)
Clinical Summary Mr. Haynesworth is a 76 y.o.maleseen today for follow up of the following medical problems.    1. Aortic stenosis - history of bicuspid AV - history of AVR 10/22/2016,  [facility] Ease Pericardial Tissue Valve (size 65mm, model # 3300TFX, serial # [id number]) along with 3 vessel CABG, MV repair, and repair of ascending aortic aneurysm     - OR on 10/22/16 for CABG X 3, AVR, MVR, repair of thoracic ascending aneurysm with placement of VAC on open chest Post op course significant for cardiogenic shock requiring multiple pressors, SAM requiring removal of mitral annuloplasty ring,  persistent fevers, encephalopathy, shocked liver, DIC due to consumptive intraoperative coagulopathy, diffuse clotting with ischemia of distal BUE/BLE extremities, A fib, C diff colitis, volume overload and VDRF with difficulty with vent wean requiring tracheostomy.  Sternal wound closed on 12/4 and was started on coumadin for RV thrombus and A fib.     [facility] course significant for MRSA bacteremia, pneumonitis, intermittent fevers, dysphagia requiring PEG tube placement on 1/15 by Dr. Kathlene Cote. He developed progressive gangrenous changes of BUE and BLE requiring B-transtibial amputation , left transmetacarpal amputation and Right MCP amputation of right hand by Dr. Sharol Given on 1/19.    01/2017 echo: 55-60%, no WMAs, grade II diastolic dysfunction. Normal functioning AV.   07/2019 echo: LVEF 55-60%, mild RV dysfunction, mild to mod MR   Jan 2023 echo: LVE 95-62%, indet diastolic fxn, mod RV dysfucntion, mod PASP, severe BAE, mild to mod MR, mod MS. Normal AVR. Dilated fixed IVC  2. SOB/Acute on chronic diastolic HF   -some recent SOB/DOE. Started 2-3 weeks ago  - compliant with meds. Some mild orthopnea. Compliant with torsemide - some cough, nonproductive. No fever chills - no chest pains.       Jan 2023 echo: LVE 13-08%, indet diastolic fxn, mod RV dysfucntion, mod PASP, severe BAE, mild to mod  MR, mod MS. Normal AVR. Dilated fixed IVC - has been talking torsemide 40mg  bid for a few days, then back to 40mg  in AM and 20mg  in PM.  - Dec 15 2021: Cr 1.23 BUN 25 BNP 2994 - clinic weight 215 and unchanged. Home 212 lbs and stable.  - CXR mild edema.  - +orthopnea that has increased -       2. Mitral regurgitation - patient had MV ring placed during his surgery 10/22/16 along with AVR, ascending thoracic aneurysm repair, and CABG. - ring was subsequently removed during that operation due to significant [facility] Jan 2023 echo: LVE 65-78%, indet diastolic fxn, mod RV dysfucntion, mod PASP, severe BAE, mild to mod MR, mod MS. Normal AVR. Dilated fixed IVC       3. CAD - history of CABG 10/22/2016 (LIMA-Diag and distal LAD, SVG-LAD) 01/2017 echo: 55-60%, no WMAs, grade II diastolic dysfunction. Normal functioning AV.     - no recent chest pain   4. Ischemic limbs - occurred in setting of cardiogenic shock requiring high dose pressors as well as DIC  - required amputation - followed by ortho. - has been on coumadin, ASA   5.Afib/aflutter - initally had post op afib - noted to be in aflutter at clinical f/u and thus was committed to long term anticoagulation  08/2019 monitor no significant arrhytmias    - 07/2019 ekg afib - 11/27/20 EKG SR - 12023 EKG afib  - last visit afib elevated rates, we increased toprol to 75mg  bid    6. Hyperlipidemia - 02/2021 TC  99 TG 227 HDL 30 LDL 33 - he is on atorvastatin   Past Medical History:  Diagnosis Date   Arthritis    "hips, shoulders; knees; back" (10/20/2016)   Bicuspid aortic valve    BPH (benign prostatic hypertrophy)    Carpal tunnel syndrome of right wrist    Coronary artery disease involving native coronary artery of native heart with unstable angina pectoris ([place]) 10/20/2016   Diverticulitis    GERD (gastroesophageal reflux disease)    Gout    Heart murmur    Hyperlipemia    Hypertension    Postoperative atrial  fibrillation ([place]) 10/24/2016   RLS (restless legs syndrome)    S/P aortic valve replacement with bioprosthetic valve 10/22/2016   25 mm [facility] Ease bovine pericardial bioprosthetic tissue valve   S/P ascending aortic aneurysm repair 10/22/2016   28 mm supracoronary straight graft replacement of ascending thoracic aortic aneurysm   S/P CABG x 3 10/22/2016   Sequential LIMA to Diag and LAD, SVG to distal LAD, open vein harvest right thigh   S/P mitral valve repair 10/22/2016   Artificial Gore-tex neochord placement x6 - posterior annuloplasty band placed but removed due to systolic anterior motion of mitral valve   Severe aortic stenosis    Snores    Never been tested for sleep apnea   Thoracic ascending aortic aneurysm 10/21/2016   Thrombocytopenia ([place]) 10/22/2016   Type II diabetes mellitus (HCC)      Allergies  Allergen Reactions   Sulfa Antibiotics Hives     Current Outpatient Medications  Medication Sig Dispense Refill   allopurinol (ZYLOPRIM) 300 MG tablet Take 300 mg by mouth daily.     aspirin EC 81 MG tablet Take 81 mg by mouth daily.     atorvastatin (LIPITOR) 40 MG tablet TAKE 1 TABLET DAILY AT 6PM 90 tablet 3   lisinopril (ZESTRIL) 2.5 MG tablet TAKE 1 TABLET DAILY 90 tablet 3   Magnesium Oxide (MAG-OX 400 PO) Take 400 mg by mouth 2 (two) times daily.     Melatonin 10 MG CAPS Take 10 mg by mouth at bedtime.      metFORMIN (GLUCOPHAGE) 500 MG tablet Take 500 mg by mouth daily with breakfast. & 1000 mg by mouth in the evening     metoprolol succinate (TOPROL-XL) 50 MG 24 hr tablet Take 1.5 tablets (75 mg total) by mouth in the morning and at bedtime. Take with or immediately following a meal. 270 tablet 3   MISC NATURAL PRODUCTS PO Take 1 tablet by mouth 2 (two) times daily. Sucontral D - natural product for healthy blood sugar levels     nitroGLYCERIN (NITROSTAT) 0.4 MG SL tablet Place 1 tablet (0.4 mg total) under the tongue every 5 (five) minutes as needed. 25  tablet 3   rOPINIRole (REQUIP) 2 MG tablet Take 2 mg by mouth See admin instructions. Takes 2mg  at 4:30pm and 2mg  at 9:30pm.     tamsulosin (FLOMAX) 0.4 MG CAPS capsule Take 0.4 mg by mouth daily.     torsemide (DEMADEX) 20 MG tablet Take 2 tablets (40 mg total) by mouth 2 (two) times daily. 360 tablet 1   warfarin (COUMADIN) 2 MG tablet TAKE 2 TABLETS BY MOUTH DAILY OR AS DIRECTED 180 tablet 2   No current facility-administered medications for this visit.     Past Surgical History:  Procedure Laterality Date   AMPUTATION Bilateral 12/11/2016   Procedure: AMPUTATION BELOW KNEE BILATERALLY;  Surgeon: [name], MD;  Location: [place];  Service: Orthopedics;  Laterality: Bilateral;   AMPUTATION Bilateral 12/11/2016   Procedure: AMPUTATION BILATERAL HANDS EXCEPT RIGHT THUMB;  Surgeon: [name], MD;  Location: [place];  Service: Orthopedics;  Laterality: Bilateral;   AORTIC VALVE REPLACEMENT N/A 10/22/2016   Procedure: AORTIC VALVE REPLACEMENT (AVR) WITH SIZE 25 MM MAGNA EASE PERICARDIAL BIOPROSTHESIS - AORTIC;  Surgeon: Rexene Alberts, MD;  Location: [place];  Service: Open Heart Surgery;  Laterality: N/A;   BONE EXCISION Right 02/01/2017   Procedure: EXCISION RIGHT INDEX METACARPAL HEAD;  Surgeon: [name], MD;  Location: [place];  Service: Orthopedics;  Laterality: Right;   CARDIAC CATHETERIZATION N/A 10/20/2016   Procedure: Right/Left Heart Cath and Coronary Angiography;  Surgeon: Leonie Man, MD;  Location: [place] [facility];  Service: Cardiovascular;  Laterality: N/A;   CARPAL TUNNEL RELEASE Right 11/28/2013   Procedure: RIGHT WRIST CARPAL TUNNEL RELEASE;  Surgeon: Lorn Junes, MD;  Location: [place];  Service: Orthopedics;  Laterality: Right;   CATARACT EXTRACTION W/ INTRAOCULAR LENS  IMPLANT, BILATERAL Bilateral 1978   COLONOSCOPY     CORONARY ARTERY BYPASS GRAFT N/A 10/22/2016   Procedure: CORONARY ARTERY BYPASS GRAFTING (CABG)x 2 WITH LIMA TO DIAGONAL,  OPEN  HARVESTING OF RIGHT SAPHENOUS VEIN FOR VEIN GRAFT TO LAD;  Surgeon: Rexene Alberts, MD;  Location: [place];  Service: Open Heart Surgery;  Laterality: N/A;   FRACTURE SURGERY     INGUINAL HERNIA REPAIR Right 1998   IR GENERIC HISTORICAL  12/07/2016   IR US GUIDE VASC ACCESS RIGHT 12/07/2016 Aletta Edouard, MD [facility]   IR GENERIC HISTORICAL  12/07/2016   IR RADIOLOGY PERIPHERAL GUIDED IV START 12/07/2016 Aletta Edouard, MD [facility]   IR GENERIC HISTORICAL  12/07/2016   IR GASTROSTOMY TUBE MOD SED 12/07/2016 Aletta Edouard, MD [facility]   LIPOMA EXCISION Right 2008   "side of my head"   MITRAL VALVE REPAIR N/A 10/22/2016   Procedure: MITRAL VALVE REPAIR (MVR) WITH SIZE 30 SORIN ANNULOFLEX ANNULOPLASTY RING WITH SUBSEQUENT REMOVAL OF RING;  Surgeon: Rexene Alberts, MD;  Location: [place];  Service: Open Heart Surgery;  Laterality: N/A;   PENECTOMY  2007   Peyronie's disease    PENILE PROSTHESIS IMPLANT  2009   REMOVAL OF PENILE PROSTHESIS N/A 02/01/2017   Procedure: REMOVAL OF PENILE PROSTHESIS;  Surgeon: Kathie Rhodes, MD;  Location: [place];  Service: Urology;  Laterality: N/A;   SHOULDER OPEN ROTATOR CUFF REPAIR Right 2006   STERNAL CLOSURE N/A 10/26/2016   Procedure: STERNAL WASHOUT AND DELAYED PRIMARY CLOSURE;  Surgeon: Rexene Alberts, MD;  Location: [place];  Service: Thoracic;  Laterality: N/A;   TEE WITHOUT CARDIOVERSION N/A 10/26/2016   Procedure: TRANSESOPHAGEAL ECHOCARDIOGRAM (TEE);  Surgeon: Rexene Alberts, MD;  Location: [place];  Service: Thoracic;  Laterality: N/A;   TEE WITHOUT CARDIOVERSION N/A 10/22/2016   Procedure: TRANSESOPHAGEAL ECHOCARDIOGRAM (TEE);  Surgeon: Rexene Alberts, MD;  Location: [place];  Service: Open Heart Surgery;  Laterality: N/A;   THORACIC AORTIC ANEURYSM REPAIR  10/22/2016   Procedure: ASCENDING AORTIC  ANEURYSM REPAIR (AAA) WITH 28 MM HEMASHIELD PLATINUM WOVEN DOUBLE VELOUR VASCULAR GRAFT;  Surgeon: Rexene Alberts, MD;  Location: [place];  Service:  Open Heart Surgery;;   TONSILLECTOMY  ~ [place] N/A 11/09/2016   Procedure: TRACHEOSTOMY;  Surgeon: Rexene Alberts, MD;  Location: [place];  Service: Thoracic;  Laterality: N/A;  TRANSURETHRAL RESECTION OF PROSTATE  2005   TRIGGER FINGER RELEASE Bilateral    several lt and rt hands   TRIGGER FINGER RELEASE Right 11/28/2013   Procedure: RIGHT TRIGGER FINGER  RELEASE (TENDON SHEATH INCISION);  Surgeon: Lorn Junes, MD;  Location: [place];  Service: Orthopedics;  Laterality: Right;   VIDEO BRONCHOSCOPY N/A 11/09/2016   Procedure: VIDEO BRONCHOSCOPY;  Surgeon: Rexene Alberts, MD;  Location: [facility];  Service: Thoracic;  Laterality: N/A;   WRIST FRACTURE SURGERY Right ~ 1959     Allergies  Allergen Reactions   Sulfa Antibiotics Hives      Family History  Problem Relation Age of Onset   Lung cancer Mother    Clotting disorder Father        No details   Heart disease Sister 50       Stents   Cancer Sister        Throat     Social History Mr. Gipson reports that he quit smoking about 39 years ago. His smoking use included pipe and cigars. He has never used smokeless tobacco. Mr. Newey reports current alcohol use of about 10.0 standard drinks per week.   Review of Systems CONSTITUTIONAL: No weight loss, fever, chills, weakness or fatigue.  HEENT: Eyes: No visual loss, blurred vision, double vision or yellow sclerae.No hearing loss, sneezing, congestion, runny nose or sore throat.  SKIN: No rash or itching.  CARDIOVASCULAR: per hpi RESPIRATORY: per hpi GASTROINTESTINAL: No anorexia, nausea, vomiting or diarrhea. No abdominal pain or blood.  GENITOURINARY: No burning on urination, no polyuria NEUROLOGICAL: No headache, dizziness, syncope, paralysis, ataxia, numbness or tingling in the extremities. No change in bowel or bladder control.  MUSCULOSKELETAL: No muscle, back pain, joint pain or stiffness.  LYMPHATICS: No enlarged nodes.  No history of splenectomy.  PSYCHIATRIC: No history of depression or anxiety.  ENDOCRINOLOGIC: No reports of sweating, cold or heat intolerance. No polyuria or polydipsia.  Marland Kitchen   Physical Examination Today's Vitals   12/24/21 1402  BP: 130/70  Pulse: 95  SpO2: 97%  Weight: 215 lb 6.4 oz (97.7 kg)  Height: 5\' 9"  (1.753 m)   Body mass index is 31.81 kg/m.  Gen: resting comfortably, no acute distress HEENT: no scleral icterus, pupils equal round and reactive, no palptable cervical adenopathy,  CV: irreg, elevated JVD Resp: Clear to auscultation bilaterally GI: abdomen is soft, non-tender, non-distended, normal bowel sounds, no hepatosplenomegaly ZHG:DJMEQASTM prosthesis Skin: warm, no rash Neuro:  no focal deficits Psych: appropriate affect   Diagnostic Studies  07/2019 echo 1. The left ventricle has normal systolic function, with an ejection fraction of 55-60%. The cavity size was normal. There is moderately increased left ventricular wall thickness. Left ventricular diastolic Doppler parameters are indeterminate.  2. The right ventricle has mildly reduced systolic function. The cavity was moderately enlarged. There is not assessed.  3. The ventricular septum flattens in systole consistent with RV pressure overload.  4. Left atrial size was severely dilated.  5. Right atrial size was mildly dilated.  6. Mitral valve regurgitation mild to moderate MR, the jet is eccentric and may be underestimated. No evidence of mitral valve stenosis.  7. The aortic valve was not well visualized. No stenosis of the aortic valve.  8. Edwards Magna Ease Pericardial Tissue Valve (size 22mm, model # 3300TFX is in the AV position.  9. The aorta is normal unless otherwise noted. 10. The aortic root is normal in size and structure. 11. Pulmonary  hypertension is indeterminate, inadequate TR jet. 12. The inferior vena cava was dilated in size with >50% respiratory variability.   Jan 2023 echo 1. Left  ventricular ejection fraction, by estimation, is 55 to 60%. The  left ventricle has normal function. Left ventricular endocardial border  not optimally defined to evaluate regional wall motion. Left ventricular  diastolic function could not be  evaluated.   2. Right ventricular systolic function is moderately reduced. The right  ventricular size is moderately enlarged. There is moderately elevated  pulmonary artery systolic pressure. The estimated right ventricular  systolic pressure is 15.1 mmHg.   3. Left atrial size was severely dilated.   4. Right atrial size was severely dilated.   5. The mitral valve is degenerative. Mild to moderate mitral valve  regurgitation. Mild mitral stenosis. The mean mitral valve gradient is 7.0  mmHg. Moderate mitral annular calcification.   6. The aortic valve has been repaired/replaced. There is a 25 mm Edwards  Magna Pericardial Tissue valve present in the aortic position.      Aortic valve regurgitation is not visualized. No aortic stenosis is  present. Aortic valve area, by VTI measures 2.42 cm. Aortic valve mean  gradient measures 5.0 mmHg. Aortic valve Vmax measures 1.64 m/s.   7. Aortic root/ascending aorta has been repaired/replaced with normal  dimensions.   8. The inferior vena cava is dilated in size with <50% respiratory  variability, suggesting right atrial pressure of 15 mmHg.   9. There is a trivial pericardial effusion that is circumferential.   Assessment and Plan   1.Acute on chronic diastolic - repeat echo as reported above, no significant changes - remains fluid overloaded. Weights 215lbs in clinic and unchanged since torsemide increase. proBNP in 2000s, CXR with mild pulm edema, JVD elevated - increase torsemide to 60mg  bid. Repeat bmet/mg/pro-bnp in 1 week, he will call to update [place] on symptoms and home weights in 1 week  2. Afib - ongoing high rates, increase toprol to 100mg  bid - could be contributing to his HF  exacerbation - has been paroxysmal in the past, last 2 visits still in afib - severe BAE, unlikely to be able to maintain SR.        Arnoldo Lenis, M.D.

## 2021-12-24 NOTE — Patient Instructions (Signed)
Medication Instructions:  INCREASE Torsemide to 60 mg tablets twice daily INCREASE Toprol XL to 100 mg tablets twice daily  Labwork: IN ONE WEEK: BMET MAG PRO BNP  Follow-Up: Keep scheduled follow up with Dr. Harl Bowie  Any Other Special Instructions Will Be Listed Below (If Applicable).  Update our office in 1 week with weights and swelling   If you need a refill on your cardiac medications before your next appointment, please call your pharmacy.

## 2021-12-31 DIAGNOSIS — I4891 Unspecified atrial fibrillation: Secondary | ICD-10-CM | POA: Diagnosis not present

## 2021-12-31 DIAGNOSIS — Z79899 Other long term (current) drug therapy: Secondary | ICD-10-CM | POA: Diagnosis not present

## 2021-12-31 DIAGNOSIS — R0602 Shortness of breath: Secondary | ICD-10-CM | POA: Diagnosis not present

## 2022-01-02 ENCOUNTER — Telehealth: Payer: Self-pay | Admitting: Cardiology

## 2022-01-02 LAB — BASIC METABOLIC PANEL
BUN/Creatinine Ratio: 18 (ref 10–24)
BUN: 22 mg/dL (ref 8–27)
CO2: 22 mmol/L (ref 20–29)
Calcium: 9.2 mg/dL (ref 8.6–10.2)
Chloride: 100 mmol/L (ref 96–106)
Creatinine, Ser: 1.19 mg/dL (ref 0.76–1.27)
Glucose: 140 mg/dL — ABNORMAL HIGH (ref 70–99)
Potassium: 4.2 mmol/L (ref 3.5–5.2)
Sodium: 141 mmol/L (ref 134–144)
eGFR: 64 mL/min/{1.73_m2} (ref >59)

## 2022-01-02 LAB — MAGNESIUM: Magnesium: 1.7 mg/dL (ref 1.6–2.3)

## 2022-01-02 LAB — BRAIN NATRIURETIC PEPTIDE: BNP: 600.6 pg/mL — ABNORMAL HIGH (ref 0.0–100.0)

## 2022-01-02 MED ORDER — TORSEMIDE 20 MG PO TABS: 80.0000 mg | ORAL_TABLET | Freq: Two times a day (BID) | ORAL | 3 refills | Status: DC

## 2022-01-02 NOTE — Telephone Encounter (Signed)
Increase torsemide to 80mg  bid and udpate [place] again on Monday  JBranch MD

## 2022-01-02 NOTE — Telephone Encounter (Signed)
Follow Up:    Patient said he was told to take his weight and call in with the weights.  12-25-21-    210.7 12-26-21-     210.5  12-27-21-     207.7   12-28-21-     208.7    12-29-21-     207.6    12-30-21-      209.1    12-31-21-       209/3     01-01-22-      210.1      01-02-22-    211.7

## 2022-01-02 NOTE — Telephone Encounter (Signed)
Spoke to pt's spouse (per DPR) who voiced understanding of plan. Pt to call Monday with weekend weights.

## 2022-01-05 ENCOUNTER — Telehealth: Payer: Self-pay | Admitting: Cardiology

## 2022-01-05 DIAGNOSIS — Z79899 Other long term (current) drug therapy: Secondary | ICD-10-CM

## 2022-01-05 NOTE — Telephone Encounter (Signed)
Pt wife called and stated Dr Harl Bowie switch pt to a different med and was to call in with his weight   Pt weighted every morning at 7:30 am.   Sat -209.7 Sun -209.2 This morning -209.2 Per wife they picked up the script for the metoprolol succinate (TOPROL-XL) 100 MG 24 hr tablet [[id number]] .   Per Dr this was increased 100 mg twice Daily  Best number [phone]

## 2022-01-05 NOTE — Telephone Encounter (Signed)
Note Increase torsemide to 80mg  bid and udpate [place] again on Monday   JBranch MD        Left message to return call.

## 2022-01-06 ENCOUNTER — Other Ambulatory Visit: Payer: Self-pay | Admitting: Cardiology

## 2022-01-06 MED ORDER — METOLAZONE 2.5 MG PO TABS: 2.5000 mg | ORAL_TABLET | ORAL | 3 refills | Status: DC

## 2022-01-06 MED ORDER — METOPROLOL SUCCINATE ER 100 MG PO TB24: 100.0000 mg | ORAL_TABLET | Freq: Two times a day (BID) | ORAL | 3 refills | Status: DC

## 2022-01-06 NOTE — Telephone Encounter (Signed)
Sat -209.7 Sun -209.2 Monday-209.2

## 2022-01-06 NOTE — Telephone Encounter (Signed)
Pt spouse verbalized understanding. Pt spouse stated that pt's SOB is still about the same. Pt's spouse agreeable to Metolazone 2.5 mg tablets once weekly on Wednesday and lab work- to be done at [facility] .

## 2022-01-06 NOTE — Telephone Encounter (Signed)
Patient's wife returned your call

## 2022-01-06 NOTE — Telephone Encounter (Signed)
Weight really not coming down much.  Are hsi symptoms any better on the the torsemide 80mg  bid. If ongoing symptoms of SOB would dose metolazone 2.5mg  once a week on Wednesdays and continue toresmide 80mg  bid. If symptoms improved can just continue to the torsemide 80mg  bid and no metolazone. Needs a bmet/mg at the end of the week   J Ramsay Bognar MD

## 2022-01-08 ENCOUNTER — Telehealth: Payer: Self-pay | Admitting: Cardiology

## 2022-01-08 NOTE — Telephone Encounter (Signed)
Listed allergy is to sulfa antibiotics. The crossover allergy to diuretics with sulfa is pretty low. Actually torsemide has sulfa in it and he has done fine with that. I would go ahead and take the metolazone.   Zandra Abts MD

## 2022-01-08 NOTE — Telephone Encounter (Signed)
Pt c/o medication issue:  1. Name of Medication: metolazone (ZAROXOLYN) 2.5 MG tablet  2. How are you currently taking this medication (dosage and times per day)? Has not started taking   3. Are you having a reaction (difficulty breathing--STAT)? no  4. What is your medication issue?  new medication flagged because it is a sulfa drug and pt has had an allergic reaction to these kinds of medication before (hives), pharmacy was to contact our office on Wednesday but pt has not heard back yet. Pharmacy wants to go ahead and refill because there is not much sulfa in it where it would cause a major issue and if so pt could take benadryl but would like to consult with Dr. Harl Bowie first.. please advise

## 2022-01-08 NOTE — Telephone Encounter (Signed)
Patient's wife informed and verbalized understanding of plan. 

## 2022-01-09 ENCOUNTER — Telehealth: Payer: Self-pay | Admitting: Cardiology

## 2022-01-09 NOTE — Telephone Encounter (Signed)
Pt c/o medication issue:  1. Name of Medication: Torsemide 20 & Zaroxolyn 2.5 mg   2. How are you currently taking this medication (dosage and times per day)? yes  3. Are you having a reaction (difficulty breathing--STAT)? no  4. What is your medication issue?  Patient wants to know if he is suppose to continue taking Torsemide while taking Zaroxolyn. States that he started the [place] today.

## 2022-01-09 NOTE — Telephone Encounter (Signed)
Wife confused on dosing of the Torsemide & Metolazone.  I reviewed previous instructions from phone notes with her, bu she understood something different - please clarify.

## 2022-01-09 NOTE — Telephone Encounter (Signed)
Torsemide should be 80mg  bid every day and metolazone just 2.5mg  once a week. If has not taken metolazone yet would take tomorrow as may have strong effect, and then would be to take every Saturday going forward.   Zandra Abts MD

## 2022-01-09 NOTE — Telephone Encounter (Signed)
Wife Danton Clap) notified & verbalized understanding.

## 2022-01-13 ENCOUNTER — Ambulatory Visit (INDEPENDENT_AMBULATORY_CARE_PROVIDER_SITE_OTHER): Payer: Medicare Other | Admitting: Cardiology

## 2022-01-13 ENCOUNTER — Encounter: Payer: Self-pay | Admitting: Cardiology

## 2022-01-13 ENCOUNTER — Ambulatory Visit (INDEPENDENT_AMBULATORY_CARE_PROVIDER_SITE_OTHER): Payer: Medicare Other | Admitting: *Deleted

## 2022-01-13 VITALS — BP 110/80 | HR 72 | Ht 69.0 in | Wt 207.8 lb

## 2022-01-13 DIAGNOSIS — I9789 Other postprocedural complications and disorders of the circulatory system, not elsewhere classified: Secondary | ICD-10-CM

## 2022-01-13 DIAGNOSIS — Z9889 Other specified postprocedural states: Secondary | ICD-10-CM | POA: Diagnosis not present

## 2022-01-13 DIAGNOSIS — I5033 Acute on chronic diastolic (congestive) heart failure: Secondary | ICD-10-CM

## 2022-01-13 DIAGNOSIS — I4891 Unspecified atrial fibrillation: Secondary | ICD-10-CM

## 2022-01-13 DIAGNOSIS — I2510 Atherosclerotic heart disease of native coronary artery without angina pectoris: Secondary | ICD-10-CM | POA: Diagnosis not present

## 2022-01-13 DIAGNOSIS — I82603 Acute embolism and thrombosis of unspecified veins of upper extremity, bilateral: Secondary | ICD-10-CM | POA: Diagnosis not present

## 2022-01-13 DIAGNOSIS — Q231 Congenital insufficiency of aortic valve: Secondary | ICD-10-CM | POA: Diagnosis not present

## 2022-01-13 LAB — POCT INR: INR: 3.7 — AB (ref 2.0–3.0)

## 2022-01-13 NOTE — Progress Notes (Signed)
Clinical Summary Scott [name] is a 76 y.o.male seen today for follow up of the following medical problems.    1. Aortic stenosis - history of bicuspid AV - history of AVR 10/22/2016,  [facility] Ease Pericardial Tissue Valve (size 103mm, model # 3300TFX, serial # [id number]) along with 3 vessel CABG, MV repair, and repair of ascending aortic aneurysm     - OR on 10/22/16 for CABG X 3, AVR, MVR, repair of thoracic ascending aneurysm with placement of VAC on open chest Post op course significant for cardiogenic shock requiring multiple pressors, SAM requiring removal of mitral annuloplasty ring,  persistent fevers, encephalopathy, shocked liver, DIC due to consumptive intraoperative coagulopathy, diffuse clotting with ischemia of distal BUE/BLE extremities, A fib, C diff colitis, volume overload and VDRF with difficulty with vent wean requiring tracheostomy.  Sternal wound closed on 12/4 and was started on coumadin for RV thrombus and A fib.     [facility] course significant for MRSA bacteremia, pneumonitis, intermittent fevers, dysphagia requiring PEG tube placement on 1/15 by Dr. Kathlene Cote. He developed progressive gangrenous changes of BUE and BLE requiring B-transtibial amputation , left transmetacarpal amputation and Right MCP amputation of right hand by Dr. Sharol Given on 1/19.    01/2017 echo: 55-60%, no WMAs, grade II diastolic dysfunction. Normal functioning AV.   07/2019 echo: LVEF 55-60%, mild RV dysfunction, mild to mod MR   Jan 2023 echo: LVE 36-64%, indet diastolic fxn, mod RV dysfucntion, mod PASP, severe BAE, mild to mod MR, mod MS. Normal AVR. Dilated fixed IVC   2. SOB/Acute on chronic diastolic HF  Jan 4034 echo: LVE 74-25%, indet diastolic fxn, mod RV dysfucntion, mod PASP, severe BAE, mild to mod MR, mod MS. Normal AVR. Dilated fixed IVC - has been talking torsemide 40mg  bid for a few days, then back to 40mg  in AM and 20mg  in PM.  - Dec 15 2021: Cr 1.23 BUN 25 BNP 2994 - clinic  weight 215 and unchanged. Home 212 lbs and stable.  - CXR mild edema.  - +orthopnea that has increased  - we increased torsemide 80mg  bid. Later started torsemide 2.5mg  daily once weekly - home weights 209 on Feb 11, down to 204.  - some improvement in breathing -          2. Mitral regurgitation - patient had MV ring placed during his surgery 10/22/16 along with AVR, ascending thoracic aneurysm repair, and CABG. - ring was subsequently removed during that operation due to significant [facility] Jan 2023 echo: LVE 95-63%, indet diastolic fxn, mod RV dysfucntion, mod PASP, severe BAE, mild to mod MR, mod MS. Normal AVR. Dilated fixed IVC       3. CAD - history of CABG 10/22/2016 (LIMA-Diag and distal LAD, SVG-LAD) 01/2017 echo: 55-60%, no WMAs, grade II diastolic dysfunction. Normal functioning AV.     - no recent chest pain   4. Ischemic limbs - occurred in setting of cardiogenic shock requiring high dose pressors as well as DIC  - required amputation - followed by ortho. - has been on coumadin, ASA   5.Afib/aflutter - initally had post op afib - noted to be in aflutter at clinical f/u and thus was committed to long term anticoagulation  08/2019 monitor no significant arrhytmias     - 07/2019 ekg afib - 11/27/20 EKG SR - 1 2023 EKG afib   - last visit afib elevated rates, we increased toprol to 100mg  bid    6. Hyperlipidemia -  02/2021 TC 99 TG 227 HDL 30 LDL 33 - he is on atorvastatin       Past Medical History:  Diagnosis Date   Arthritis    "hips, shoulders; knees; back" (10/20/2016)   Bicuspid aortic valve    BPH (benign prostatic hypertrophy)    Carpal tunnel syndrome of right wrist    Coronary artery disease involving native coronary artery of native heart with unstable angina pectoris ([place]) 10/20/2016   Diverticulitis    GERD (gastroesophageal reflux disease)    Gout    Heart murmur    Hyperlipemia    Hypertension    Postoperative atrial fibrillation ([place])  10/24/2016   RLS (restless legs syndrome)    S/P aortic valve replacement with bioprosthetic valve 10/22/2016   25 mm [facility] Ease bovine pericardial bioprosthetic tissue valve   S/P ascending aortic aneurysm repair 10/22/2016   28 mm supracoronary straight graft replacement of ascending thoracic aortic aneurysm   S/P CABG x 3 10/22/2016   Sequential LIMA to Diag and LAD, SVG to distal LAD, open vein harvest right thigh   S/P mitral valve repair 10/22/2016   Artificial Gore-tex neochord placement x6 - posterior annuloplasty band placed but removed due to systolic anterior motion of mitral valve   Severe aortic stenosis    Snores    Never been tested for sleep apnea   Thoracic ascending aortic aneurysm 10/21/2016   Thrombocytopenia ([place]) 10/22/2016   Type II diabetes mellitus (HCC)      Allergies  Allergen Reactions   Sulfa Antibiotics Hives     Current Outpatient Medications  Medication Sig Dispense Refill   allopurinol (ZYLOPRIM) 300 MG tablet Take 300 mg by mouth daily.     aspirin EC 81 MG tablet Take 81 mg by mouth daily.     atorvastatin (LIPITOR) 40 MG tablet TAKE 1 TABLET DAILY AT 6PM 90 tablet 3   lisinopril (ZESTRIL) 2.5 MG tablet TAKE 1 TABLET DAILY 90 tablet 3   Magnesium Oxide (MAG-OX 400 PO) Take 400 mg by mouth 2 (two) times daily.     Melatonin 10 MG CAPS Take 10 mg by mouth at bedtime.      metFORMIN (GLUCOPHAGE) 500 MG tablet Take 500 mg by mouth daily with breakfast. & 1000 mg by mouth in the evening     metolazone (ZAROXOLYN) 2.5 MG tablet Take 1 tablet (2.5 mg total) by mouth every Wednesday. 12 tablet 3   metoprolol succinate (TOPROL-XL) 100 MG 24 hr tablet Take 1 tablet (100 mg total) by mouth 2 (two) times daily. Take with or immediately following a meal. 180 tablet 3   MISC NATURAL PRODUCTS PO Take 1 tablet by mouth 2 (two) times daily. Sucontral D - natural product for healthy blood sugar levels     nitroGLYCERIN (NITROSTAT) 0.4 MG SL tablet Place 1  tablet (0.4 mg total) under the tongue every 5 (five) minutes as needed. 25 tablet 3   rOPINIRole (REQUIP) 2 MG tablet Take 2 mg by mouth See admin instructions. Takes 2mg  at 4:30pm and 2mg  at 9:30pm.     tamsulosin (FLOMAX) 0.4 MG CAPS capsule Take 0.4 mg by mouth daily.     torsemide (DEMADEX) 20 MG tablet Take 4 tablets (80 mg total) by mouth 2 (two) times daily. 360 tablet 3   warfarin (COUMADIN) 2 MG tablet TAKE 2 TABLETS BY MOUTH DAILY OR AS DIRECTED 180 tablet 2   No current facility-administered medications for this visit.     Past  Surgical History:  Procedure Laterality Date   AMPUTATION Bilateral 12/11/2016   Procedure: AMPUTATION BELOW KNEE BILATERALLY;  Surgeon: [name], MD;  Location: [place];  Service: Orthopedics;  Laterality: Bilateral;   AMPUTATION Bilateral 12/11/2016   Procedure: AMPUTATION BILATERAL HANDS EXCEPT RIGHT THUMB;  Surgeon: [name], MD;  Location: [place];  Service: Orthopedics;  Laterality: Bilateral;   AORTIC VALVE REPLACEMENT N/A 10/22/2016   Procedure: AORTIC VALVE REPLACEMENT (AVR) WITH SIZE 25 MM MAGNA EASE PERICARDIAL BIOPROSTHESIS - AORTIC;  Surgeon: Rexene Alberts, MD;  Location: [place];  Service: Open Heart Surgery;  Laterality: N/A;   BONE EXCISION Right 02/01/2017   Procedure: EXCISION RIGHT INDEX METACARPAL HEAD;  Surgeon: [name], MD;  Location: [place];  Service: Orthopedics;  Laterality: Right;   CARDIAC CATHETERIZATION N/A 10/20/2016   Procedure: Right/Left Heart Cath and Coronary Angiography;  Surgeon: Leonie Man, MD;  Location: [place] [facility];  Service: Cardiovascular;  Laterality: N/A;   CARPAL TUNNEL RELEASE Right 11/28/2013   Procedure: RIGHT WRIST CARPAL TUNNEL RELEASE;  Surgeon: Lorn Junes, MD;  Location: [place];  Service: Orthopedics;  Laterality: Right;   CATARACT EXTRACTION W/ INTRAOCULAR LENS  IMPLANT, BILATERAL Bilateral 1978   COLONOSCOPY     CORONARY ARTERY BYPASS GRAFT N/A 10/22/2016    Procedure: CORONARY ARTERY BYPASS GRAFTING (CABG)x 2 WITH LIMA TO DIAGONAL, OPEN  HARVESTING OF RIGHT SAPHENOUS VEIN FOR VEIN GRAFT TO LAD;  Surgeon: Rexene Alberts, MD;  Location: [place];  Service: Open Heart Surgery;  Laterality: N/A;   FRACTURE SURGERY     INGUINAL HERNIA REPAIR Right 1998   IR GENERIC HISTORICAL  12/07/2016   IR US GUIDE VASC ACCESS RIGHT 12/07/2016 Aletta Edouard, MD [facility]   IR GENERIC HISTORICAL  12/07/2016   IR RADIOLOGY PERIPHERAL GUIDED IV START 12/07/2016 Aletta Edouard, MD [facility]   IR GENERIC HISTORICAL  12/07/2016   IR GASTROSTOMY TUBE MOD SED 12/07/2016 Aletta Edouard, MD [facility]   LIPOMA EXCISION Right 2008   "side of my head"   MITRAL VALVE REPAIR N/A 10/22/2016   Procedure: MITRAL VALVE REPAIR (MVR) WITH SIZE 30 SORIN ANNULOFLEX ANNULOPLASTY RING WITH SUBSEQUENT REMOVAL OF RING;  Surgeon: Rexene Alberts, MD;  Location: [place];  Service: Open Heart Surgery;  Laterality: N/A;   PENECTOMY  2007   Peyronie's disease    PENILE PROSTHESIS IMPLANT  2009   REMOVAL OF PENILE PROSTHESIS N/A 02/01/2017   Procedure: REMOVAL OF PENILE PROSTHESIS;  Surgeon: Kathie Rhodes, MD;  Location: [place];  Service: Urology;  Laterality: N/A;   SHOULDER OPEN ROTATOR CUFF REPAIR Right 2006   STERNAL CLOSURE N/A 10/26/2016   Procedure: STERNAL WASHOUT AND DELAYED PRIMARY CLOSURE;  Surgeon: Rexene Alberts, MD;  Location: [place];  Service: Thoracic;  Laterality: N/A;   TEE WITHOUT CARDIOVERSION N/A 10/26/2016   Procedure: TRANSESOPHAGEAL ECHOCARDIOGRAM (TEE);  Surgeon: Rexene Alberts, MD;  Location: [place];  Service: Thoracic;  Laterality: N/A;   TEE WITHOUT CARDIOVERSION N/A 10/22/2016   Procedure: TRANSESOPHAGEAL ECHOCARDIOGRAM (TEE);  Surgeon: Rexene Alberts, MD;  Location: [place];  Service: Open Heart Surgery;  Laterality: N/A;   THORACIC AORTIC ANEURYSM REPAIR  10/22/2016   Procedure: ASCENDING AORTIC  ANEURYSM REPAIR (AAA) WITH 28 MM HEMASHIELD PLATINUM WOVEN DOUBLE  VELOUR VASCULAR GRAFT;  Surgeon: Rexene Alberts, MD;  Location: [place];  Service: Open Heart Surgery;;   TONSILLECTOMY  ~ 1955  TRACHEOSTOMY TUBE PLACEMENT N/A 11/09/2016   Procedure: TRACHEOSTOMY;  Surgeon: Rexene Alberts, MD;  Location: [place];  Service: Thoracic;  Laterality: N/A;   TRANSURETHRAL RESECTION OF PROSTATE  2005   TRIGGER FINGER RELEASE Bilateral    several lt and rt hands   TRIGGER FINGER RELEASE Right 11/28/2013   Procedure: RIGHT TRIGGER FINGER  RELEASE (TENDON SHEATH INCISION);  Surgeon: Lorn Junes, MD;  Location: [place];  Service: Orthopedics;  Laterality: Right;   VIDEO BRONCHOSCOPY N/A 11/09/2016   Procedure: VIDEO BRONCHOSCOPY;  Surgeon: Rexene Alberts, MD;  Location: [facility];  Service: Thoracic;  Laterality: N/A;   WRIST FRACTURE SURGERY Right ~ 1959     Allergies  Allergen Reactions   Sulfa Antibiotics Hives      Family History  Problem Relation Age of Onset   Lung cancer Mother    Clotting disorder Father        No details   Heart disease Sister 10       Stents   Cancer Sister        Throat     Social History Mr. Tetzloff reports that he quit smoking about 39 years ago. His smoking use included pipe and cigars. He has never used smokeless tobacco. Mr. Kleve reports current alcohol use of about 10.0 standard drinks per week.   Review of Systems CONSTITUTIONAL: No weight loss, fever, chills, weakness or fatigue.  HEENT: Eyes: No visual loss, blurred vision, double vision or yellow sclerae.No hearing loss, sneezing, congestion, runny nose or sore throat.  SKIN: No rash or itching.  CARDIOVASCULAR: per hpi RESPIRATORY: per hpi GASTROINTESTINAL: No anorexia, nausea, vomiting or diarrhea. No abdominal pain or blood.  GENITOURINARY: No burning on urination, no polyuria NEUROLOGICAL: No headache, dizziness, syncope, paralysis, ataxia, numbness or tingling in the extremities. No change in bowel or bladder control.  MUSCULOSKELETAL:  No muscle, back pain, joint pain or stiffness.  LYMPHATICS: No enlarged nodes. No history of splenectomy.  PSYCHIATRIC: No history of depression or anxiety.  ENDOCRINOLOGIC: No reports of sweating, cold or heat intolerance. No polyuria or polydipsia.  Marland Kitchen   Physical Examination Today's Vitals   01/13/22 1314  BP: 110/80  Pulse: 72  SpO2: 95%  Weight: 207 lb 12.8 oz (94.3 kg)  Height: 5\' 9"  (1.753 m)   Body mass index is 30.69 kg/m.  Gen: resting comfortably, no acute distress HEENT: no scleral icterus, pupils equal round and reactive, no palptable cervical adenopathy,  CV: irreg, 2/6 systolic murmur rusb. +JVD Resp: Clear to auscultation bilaterally GI: abdomen is soft, non-tender, non-distended, normal bowel sounds, no hepatosplenomegaly MSK: extremities are warm, no edema.  Skin: warm, no rash Neuro:  no focal deficits Psych: appropriate affect   Diagnostic Studies 07/2019 echo 1. The left ventricle has normal systolic function, with an ejection fraction of 55-60%. The cavity size was normal. There is moderately increased left ventricular wall thickness. Left ventricular diastolic Doppler parameters are indeterminate.  2. The right ventricle has mildly reduced systolic function. The cavity was moderately enlarged. There is not assessed.  3. The ventricular septum flattens in systole consistent with RV pressure overload.  4. Left atrial size was severely dilated.  5. Right atrial size was mildly dilated.  6. Mitral valve regurgitation mild to moderate MR, the jet is eccentric and may be underestimated. No evidence of mitral valve stenosis.  7. The aortic valve was not well visualized. No stenosis of the aortic valve.  8. [facility] Ease  Pericardial Tissue Valve (size 14mm, model # 3300TFX is in the AV position.  9. The aorta is normal unless otherwise noted. 10. The aortic root is normal in size and structure. 11. Pulmonary hypertension is indeterminate, inadequate TR  jet. 12. The inferior vena cava was dilated in size with >50% respiratory variability.     Jan 2023 echo 1. Left ventricular ejection fraction, by estimation, is 55 to 60%. The  left ventricle has normal function. Left ventricular endocardial border  not optimally defined to evaluate regional wall motion. Left ventricular  diastolic function could not be  evaluated.   2. Right ventricular systolic function is moderately reduced. The right  ventricular size is moderately enlarged. There is moderately elevated  pulmonary artery systolic pressure. The estimated right ventricular  systolic pressure is 83.4 mmHg.   3. Left atrial size was severely dilated.   4. Right atrial size was severely dilated.   5. The mitral valve is degenerative. Mild to moderate mitral valve  regurgitation. Mild mitral stenosis. The mean mitral valve gradient is 7.0  mmHg. Moderate mitral annular calcification.   6. The aortic valve has been repaired/replaced. There is a 25 mm Edwards  Magna Pericardial Tissue valve present in the aortic position.      Aortic valve regurgitation is not visualized. No aortic stenosis is  present. Aortic valve area, by VTI measures 2.42 cm. Aortic valve mean  gradient measures 5.0 mmHg. Aortic valve Vmax measures 1.64 m/s.   7. Aortic root/ascending aorta has been repaired/replaced with normal  dimensions.   8. The inferior vena cava is dilated in size with <50% respiratory  variability, suggesting right atrial pressure of 15 mmHg.   9. There is a trivial pericardial effusion that is circumferential.     Assessment and Plan  1.Acute on chronic diastolic - repeat echo as reported above, no significant changes - significant recent issues with fluid overload, SOB/DOE - currently on torsemide 80mg  bid and metolazone 2.5mg  on Wednesday - home weights down 5 lbs over the last 2 weeks, 209-->204. Breathing improving but not back to normal, ongoing signs of HF - continue current  diuretic regimen, recheck bmet/mg/bnp Friday and he will call and update [place] on weights Friday.    2. Afib/acquired thrombophilia - has been paroxysmal in the past, but last few visits staying in afib - severe BAE, unlikely to be able to maintain SR.  - continue rate control, continue eliquis for stroke prevention      Scott [name], M.D.

## 2022-01-13 NOTE — Patient Instructions (Signed)
Medication Instructions:  Your physician recommends that you continue on your current medications as directed. Please refer to the Current Medication list given to you today.   Labwork: Your physician recommends that you return for lab work in:  BMET, Mag, BNP to be done at [place] within a week  Testing/Procedures: none  Follow-Up: Your physician recommends that you schedule a follow-up appointment in: 1 month  Any Other Special Instructions Will Be Listed Below (If Applicable). Please call the office on Friday, 2/24 with a log of your weights and breathing.  If you need a refill on your cardiac medications before your next appointment, please call your pharmacy.

## 2022-01-13 NOTE — Patient Instructions (Signed)
Hold warfarin tonight then decrease dose to 2 tablets daily except 1 tablet on Mondays and Thursdays  Continue Vit K foods Recheck 3 wks

## 2022-01-16 ENCOUNTER — Telehealth: Payer: Self-pay | Admitting: Cardiology

## 2022-01-16 NOTE — Telephone Encounter (Signed)
Calling with update as requested at last OV.

## 2022-01-16 NOTE — Telephone Encounter (Signed)
Patient was calling to say his weight is 201.9, his breathing is better, and his swelling has decrease in his legs. Please advise

## 2022-01-19 DIAGNOSIS — I5033 Acute on chronic diastolic (congestive) heart failure: Secondary | ICD-10-CM | POA: Diagnosis not present

## 2022-01-19 DIAGNOSIS — Z5181 Encounter for therapeutic drug level monitoring: Secondary | ICD-10-CM | POA: Diagnosis not present

## 2022-01-19 NOTE — Telephone Encounter (Signed)
Weights continuing to trend down, would continue current regimen with torsemide 80mg  bid and metoalzone 2.5mg  every Wed. Can he get his blood work done this week?  J Maleek Craver MD

## 2022-01-19 NOTE — Telephone Encounter (Signed)
Notified wife Danton Clap) - stated he got his labs done this morning.

## 2022-01-20 LAB — BASIC METABOLIC PANEL
BUN/Creatinine Ratio: 26 — ABNORMAL HIGH (ref 10–24)
BUN: 29 mg/dL — ABNORMAL HIGH (ref 8–27)
CO2: 28 mmol/L (ref 20–29)
Calcium: 9.3 mg/dL (ref 8.6–10.2)
Chloride: 92 mmol/L — ABNORMAL LOW (ref 96–106)
Creatinine, Ser: 1.11 mg/dL (ref 0.76–1.27)
Glucose: 163 mg/dL — ABNORMAL HIGH (ref 70–99)
Potassium: 4.2 mmol/L (ref 3.5–5.2)
Sodium: 135 mmol/L (ref 134–144)
eGFR: 69 mL/min/{1.73_m2} (ref >59)

## 2022-01-20 LAB — BRAIN NATRIURETIC PEPTIDE: BNP: 584.4 pg/mL — ABNORMAL HIGH (ref 0.0–100.0)

## 2022-01-20 LAB — MAGNESIUM: Magnesium: 1.7 mg/dL (ref 1.6–2.3)

## 2022-02-02 ENCOUNTER — Telehealth: Payer: Self-pay | Admitting: Cardiology

## 2022-02-02 NOTE — Telephone Encounter (Signed)
-----   Message from Arnoldo Lenis, MD sent at 02/01/2022 11:06 AM EDT ----- ?Labs overall look good, still suggest some ongoing fluid overload but improving. MOst improtantly kidney function is normal electrolytes are normal ? ?Zandra Abts MD ?

## 2022-02-02 NOTE — Telephone Encounter (Signed)
Spoke to pt's spouse (ok per DPR) who verbalized understanding of results. Pt's spouse had no questions or concerns at this time. ?

## 2022-02-02 NOTE — Telephone Encounter (Signed)
Patient's wife is returning call to discuss lab results. 

## 2022-02-03 ENCOUNTER — Ambulatory Visit (INDEPENDENT_AMBULATORY_CARE_PROVIDER_SITE_OTHER): Payer: Medicare Other | Admitting: *Deleted

## 2022-02-03 ENCOUNTER — Other Ambulatory Visit: Payer: Self-pay

## 2022-02-03 DIAGNOSIS — Z953 Presence of xenogenic heart valve: Secondary | ICD-10-CM

## 2022-02-03 DIAGNOSIS — I9789 Other postprocedural complications and disorders of the circulatory system, not elsewhere classified: Secondary | ICD-10-CM

## 2022-02-03 DIAGNOSIS — Z5181 Encounter for therapeutic drug level monitoring: Secondary | ICD-10-CM | POA: Diagnosis not present

## 2022-02-03 DIAGNOSIS — Q231 Congenital insufficiency of aortic valve: Secondary | ICD-10-CM | POA: Diagnosis not present

## 2022-02-03 DIAGNOSIS — I4891 Unspecified atrial fibrillation: Secondary | ICD-10-CM

## 2022-02-03 DIAGNOSIS — Z9889 Other specified postprocedural states: Secondary | ICD-10-CM

## 2022-02-03 DIAGNOSIS — I82603 Acute embolism and thrombosis of unspecified veins of upper extremity, bilateral: Secondary | ICD-10-CM | POA: Diagnosis not present

## 2022-02-03 LAB — POCT INR: INR: 3.2 — AB (ref 2.0–3.0)

## 2022-02-03 NOTE — Patient Instructions (Signed)
Decrease warfarin to 2 tablets daily except 1 tablet on Tuesdays, Thursdays and Saturdays  ?Continue Vit K foods ?Recheck 3 wks ?

## 2022-02-11 ENCOUNTER — Encounter: Payer: Self-pay | Admitting: Cardiology

## 2022-02-11 ENCOUNTER — Ambulatory Visit (INDEPENDENT_AMBULATORY_CARE_PROVIDER_SITE_OTHER): Payer: Medicare Other | Admitting: Cardiology

## 2022-02-11 ENCOUNTER — Other Ambulatory Visit: Payer: Self-pay

## 2022-02-11 VITALS — BP 103/68 | HR 72 | Ht 69.0 in | Wt 221.6 lb

## 2022-02-11 DIAGNOSIS — I2510 Atherosclerotic heart disease of native coronary artery without angina pectoris: Secondary | ICD-10-CM

## 2022-02-11 DIAGNOSIS — I5033 Acute on chronic diastolic (congestive) heart failure: Secondary | ICD-10-CM | POA: Diagnosis not present

## 2022-02-11 MED ORDER — METOLAZONE 2.5 MG PO TABS: 2.5000 mg | ORAL_TABLET | ORAL | 1 refills | Status: DC

## 2022-02-11 NOTE — Progress Notes (Signed)
? ? ? ?Clinical Summary ?[name] is a 75 y.o.male seen today for follow up of the following medical problems.  ?  ?1. Aortic stenosis ?- history of bicuspid AV ?- history of AVR 10/22/2016,  [facility] Ease Pericardial Tissue Valve (size 49m, model # 3300TFX, serial # [id number] along with 3 vessel CABG, MV repair, and repair of ascending aortic aneurysm ?  ?  ?- OR on 10/22/16 for CABG X 3, AVR, MVR, repair of thoracic ascending aneurysm with placement of VAC on open chest ?Post op course significant for cardiogenic shock requiring multiple pressors, SAM requiring removal of mitral annuloplasty ring,  persistent fevers, encephalopathy, shocked liver, DIC due to consumptive intraoperative coagulopathy, diffuse clotting with ischemia of distal BUE/BLE extremities, A fib, C diff colitis, volume overload and VDRF with difficulty with vent wean requiring tracheostomy.  Sternal wound closed on 12/4 and was started on coumadin for RV thrombus and A fib.   ?  ?-Hospital course significant for MRSA bacteremia, pneumonitis, intermittent fevers, dysphagia requiring PEG tube placement on 1/15 by Dr. YKathlene Cote He developed progressive gangrenous changes of BUE and BLE requiring B-transtibial amputation , left transmetacarpal amputation and Right MCP amputation of right hand by Dr. DSharol Givenon 1/19. ?  ? 01/2017 echo: 55-60%, no WMAs, grade II diastolic dysfunction. Normal functioning AV. ?  ?07/2019 echo: LVEF 55-60%, mild RV dysfunction, mild to mod MR ?  ?Jan 2023 echo: LVE 578-24% indet diastolic fxn, mod RV dysfucntion, mod PASP, severe BAE, mild to mod MR, mod MS. Normal AVR. Dilated fixed IVC ?  ?2. SOB/Acute on chronic diastolic HF ? Jan 2023 echo: LVE 523-53% indet diastolic fxn, mod RV dysfucntion, mod PASP, severe BAE, mild to mod MR, mod MS. Normal AVR. Dilated fixed IVC ?- has been talking torsemide '40mg'$  bid for a few days, then back to '40mg'$  in AM and '20mg'$  in PM.  ?- Dec 15 2021: Cr 1.23 BUN 25 BNP 2994 ?- clinic  weight 215 and unchanged. Home 212 lbs and stable.  ?- CXR mild edema.  ?- +orthopnea that has increased ?  ? ? ? ?Last visit  on torsemide '80mg'$  bid and metolazone 2.'5mg'$  on Wednesday ?- home weights down 5 lbs over the last 2 weeks, 209-->204. Breathing improving but not back to normal, ?-01/19/22 labs: Cr 1.11 BUN 29 K 4.2 BNP 584 ?  ?  ? -weight back ot 221 lbs today ?- was down to 200lbs last week, up to 209 lbs ?- some increase SOB, some increased edema ?- compliant with meds. Torsemide '80mg'$  bid, metolazone. 2.'5mg'$  ? ? ? ?Other medical issues not addressed this visit ?  ?2. Mitral regurgitation ?- patient had MV ring placed during his surgery 10/22/16 along with AVR, ascending thoracic aneurysm repair, and CABG. ?- ring was subsequently removed during that operation due to significant SAM ?Jan 2023 echo: LVE 561-44% indet diastolic fxn, mod RV dysfucntion, mod PASP, severe BAE, mild to mod MR, mod MS. Normal AVR. Dilated fixed IVC ?  ?  ?  ?3. CAD ?- history of CABG 10/22/2016 (LIMA-Diag and distal LAD, SVG-LAD) ?01/2017 echo: 55-60%, no WMAs, grade II diastolic dysfunction. Normal functioning AV. ?  ?  ?- no recent chest pain ?  ?4. Ischemic limbs ?- occurred in setting of cardiogenic shock requiring high dose pressors as well as DIC  ?- required amputation ?- followed by ortho. ?- has been on coumadin, ASA ?  ?5.Afib/aflutter ?- initally had post op afib ?- noted to be  in aflutter at clinical f/u and thus was committed to long term anticoagulation ? 08/2019 monitor no significant arrhytmias ?  ?  ?- 07/2019 ekg afib ?- 11/27/20 EKG SR ?- 1 2023 EKG afib ?  ?- last visit afib elevated rates, we increased toprol to '100mg'$  bid ?  ? 6. Hyperlipidemia ?- 02/2021 TC 99 TG 227 HDL 30 LDL 33 ?- he is on atorvastatin ?  ?7. OSA screen ?- +daytime somolence, some apnea ?  ?Past Medical History:  ?Diagnosis Date  ? Arthritis   ? "hips, shoulders; knees; back" (10/20/2016)  ? Bicuspid aortic valve   ? BPH (benign prostatic  hypertrophy)   ? Carpal tunnel syndrome of right wrist   ? Coronary artery disease involving native coronary artery of native heart with unstable angina pectoris ([place]) 10/20/2016  ? Diverticulitis   ? GERD (gastroesophageal reflux disease)   ? Gout   ? Heart murmur   ? Hyperlipemia   ? Hypertension   ? Postoperative atrial fibrillation ([place]) 10/24/2016  ? RLS (restless legs syndrome)   ? S/P aortic valve replacement with bioprosthetic valve 10/22/2016  ? 25 mm [facility] Ease bovine pericardial bioprosthetic tissue valve  ? S/P ascending aortic aneurysm repair 10/22/2016  ? 28 mm supracoronary straight graft replacement of ascending thoracic aortic aneurysm  ? S/P CABG x 3 10/22/2016  ? Sequential LIMA to Diag and LAD, SVG to distal LAD, open vein harvest right thigh  ? S/P mitral valve repair 10/22/2016  ? Artificial Gore-tex neochord placement x6 - posterior annuloplasty band placed but removed due to systolic anterior motion of mitral valve  ? Severe aortic stenosis   ? Snores   ? Never been tested for sleep apnea  ? Thoracic ascending aortic aneurysm 10/21/2016  ? Thrombocytopenia ([place]) 10/22/2016  ? Type II diabetes mellitus ([place])   ? ? ? ?Allergies  ?Allergen Reactions  ? Sulfa Antibiotics Hives  ? ? ? ?Current Outpatient Medications  ?Medication Sig Dispense Refill  ? allopurinol (ZYLOPRIM) 300 MG tablet Take 300 mg by mouth daily.    ? aspirin EC 81 MG tablet Take 81 mg by mouth daily.    ? atorvastatin (LIPITOR) 40 MG tablet TAKE 1 TABLET DAILY AT 6PM 90 tablet 3  ? lisinopril (ZESTRIL) 2.5 MG tablet TAKE 1 TABLET DAILY 90 tablet 3  ? Magnesium Oxide (MAG-OX 400 PO) Take 400 mg by mouth 2 (two) times daily.    ? Melatonin 10 MG CAPS Take 10 mg by mouth at bedtime.     ? metFORMIN (GLUCOPHAGE) 500 MG tablet Take 500 mg by mouth daily with breakfast. & 1000 mg by mouth in the evening    ? metolazone (ZAROXOLYN) 2.5 MG tablet Take 1 tablet (2.5 mg total) by mouth every Wednesday. 12 tablet 3  ? metoprolol  succinate (TOPROL-XL) 100 MG 24 hr tablet Take 1 tablet (100 mg total) by mouth 2 (two) times daily. Take with or immediately following a meal. 180 tablet 3  ? MISC NATURAL PRODUCTS PO Take 1 tablet by mouth 2 (two) times daily. Sucontral D - natural product for healthy blood sugar levels    ? nitroGLYCERIN (NITROSTAT) 0.4 MG SL tablet Place 1 tablet (0.4 mg total) under the tongue every 5 (five) minutes as needed. 25 tablet 3  ? rOPINIRole (REQUIP) 2 MG tablet Take 2 mg by mouth See admin instructions. Takes '2mg'$  at 4:30pm and '2mg'$  at 9:30pm.    ? tamsulosin (FLOMAX) 0.4 MG CAPS capsule Take  0.4 mg by mouth daily.    ? torsemide (DEMADEX) 20 MG tablet Take 4 tablets (80 mg total) by mouth 2 (two) times daily. 360 tablet 3  ? warfarin (COUMADIN) 2 MG tablet TAKE 2 TABLETS BY MOUTH DAILY OR AS DIRECTED 180 tablet 2  ? ?No current facility-administered medications for this visit.  ? ? ? ?Past Surgical History:  ?Procedure Laterality Date  ? AMPUTATION Bilateral 12/11/2016  ? Procedure: AMPUTATION BELOW KNEE BILATERALLY;  Surgeon: [name], MD;  Location: [place];  Service: Orthopedics;  Laterality: Bilateral;  ? AMPUTATION Bilateral 12/11/2016  ? Procedure: AMPUTATION BILATERAL HANDS EXCEPT RIGHT THUMB;  Surgeon: [name], MD;  Location: [place];  Service: Orthopedics;  Laterality: Bilateral;  ? AORTIC VALVE REPLACEMENT N/A 10/22/2016  ? Procedure: AORTIC VALVE REPLACEMENT (AVR) WITH SIZE 25 MM MAGNA EASE PERICARDIAL BIOPROSTHESIS - AORTIC;  Surgeon: Rexene Alberts, MD;  Location: [place];  Service: Open Heart Surgery;  Laterality: N/A;  ? BONE EXCISION Right 02/01/2017  ? Procedure: EXCISION RIGHT INDEX METACARPAL HEAD;  Surgeon: [name], MD;  Location: [place];  Service: Orthopedics;  Laterality: Right;  ? CARDIAC CATHETERIZATION N/A 10/20/2016  ? Procedure: Right/Left Heart Cath and Coronary Angiography;  Surgeon: Leonie Man, MD;  Location: [place] [facility];  Service: Cardiovascular;  Laterality: N/A;  ?  CARPAL TUNNEL RELEASE Right 11/28/2013  ? Procedure: RIGHT WRIST CARPAL TUNNEL RELEASE;  Surgeon: Lorn Junes, MD;  Location: [place];  Service: Orthopedics;  Laterality: Right;  ? CATARACT

## 2022-02-11 NOTE — Patient Instructions (Addendum)
Medication Instructions:  ?Your physician has recommended you make the following change in your medication:  ?Change metolazone 2.5 mg to twice weekly on Wednesday & Saturday ?Continue other medications the same ? ?Labwork: ?none ? ?Testing/Procedures: ?none ? ?Follow-Up: ?Your physician recommends that you schedule a follow-up appointment in: 3 months ? ?Any Other Special Instructions Will Be Listed Below (If Applicable). ?Call office on Monday and update [place] on your weights ? ?If you need a refill on your cardiac medications before your next appointment, please call your pharmacy. ?

## 2022-02-20 DIAGNOSIS — E1142 Type 2 diabetes mellitus with diabetic polyneuropathy: Secondary | ICD-10-CM | POA: Diagnosis not present

## 2022-02-20 DIAGNOSIS — I10 Essential (primary) hypertension: Secondary | ICD-10-CM | POA: Diagnosis not present

## 2022-02-24 ENCOUNTER — Ambulatory Visit (INDEPENDENT_AMBULATORY_CARE_PROVIDER_SITE_OTHER): Payer: Medicare Other | Admitting: *Deleted

## 2022-02-24 DIAGNOSIS — Q231 Congenital insufficiency of aortic valve: Secondary | ICD-10-CM | POA: Diagnosis not present

## 2022-02-24 DIAGNOSIS — I82603 Acute embolism and thrombosis of unspecified veins of upper extremity, bilateral: Secondary | ICD-10-CM

## 2022-02-24 DIAGNOSIS — I9789 Other postprocedural complications and disorders of the circulatory system, not elsewhere classified: Secondary | ICD-10-CM

## 2022-02-24 DIAGNOSIS — Z5181 Encounter for therapeutic drug level monitoring: Secondary | ICD-10-CM | POA: Diagnosis not present

## 2022-02-24 DIAGNOSIS — Z953 Presence of xenogenic heart valve: Secondary | ICD-10-CM | POA: Diagnosis not present

## 2022-02-24 DIAGNOSIS — Z9889 Other specified postprocedural states: Secondary | ICD-10-CM | POA: Diagnosis not present

## 2022-02-24 DIAGNOSIS — I4891 Unspecified atrial fibrillation: Secondary | ICD-10-CM | POA: Diagnosis not present

## 2022-02-24 LAB — POCT INR: INR: 3 (ref 2.0–3.0)

## 2022-02-24 NOTE — Patient Instructions (Signed)
Continue warfarin 2 tablets daily except 1 tablet on Tuesdays, Thursdays and Saturdays  Continue Vit K foods Recheck 4 wks 

## 2022-02-27 ENCOUNTER — Ambulatory Visit: Payer: Medicare Other | Admitting: Cardiology

## 2022-03-04 DIAGNOSIS — E7849 Other hyperlipidemia: Secondary | ICD-10-CM | POA: Diagnosis not present

## 2022-03-04 DIAGNOSIS — E782 Mixed hyperlipidemia: Secondary | ICD-10-CM | POA: Diagnosis not present

## 2022-03-04 DIAGNOSIS — I10 Essential (primary) hypertension: Secondary | ICD-10-CM | POA: Diagnosis not present

## 2022-03-04 DIAGNOSIS — E7801 Familial hypercholesterolemia: Secondary | ICD-10-CM | POA: Diagnosis not present

## 2022-03-04 DIAGNOSIS — K2100 Gastro-esophageal reflux disease with esophagitis, without bleeding: Secondary | ICD-10-CM | POA: Diagnosis not present

## 2022-03-04 DIAGNOSIS — E1165 Type 2 diabetes mellitus with hyperglycemia: Secondary | ICD-10-CM | POA: Diagnosis not present

## 2022-03-04 DIAGNOSIS — E7800 Pure hypercholesterolemia, unspecified: Secondary | ICD-10-CM | POA: Diagnosis not present

## 2022-03-09 DIAGNOSIS — E1140 Type 2 diabetes mellitus with diabetic neuropathy, unspecified: Secondary | ICD-10-CM | POA: Diagnosis not present

## 2022-03-09 DIAGNOSIS — Z0001 Encounter for general adult medical examination with abnormal findings: Secondary | ICD-10-CM | POA: Diagnosis not present

## 2022-03-09 DIAGNOSIS — M1A9XX0 Chronic gout, unspecified, without tophus (tophi): Secondary | ICD-10-CM | POA: Diagnosis not present

## 2022-03-09 DIAGNOSIS — R197 Diarrhea, unspecified: Secondary | ICD-10-CM | POA: Diagnosis not present

## 2022-03-09 DIAGNOSIS — Z8679 Personal history of other diseases of the circulatory system: Secondary | ICD-10-CM | POA: Diagnosis not present

## 2022-03-09 DIAGNOSIS — I10 Essential (primary) hypertension: Secondary | ICD-10-CM | POA: Diagnosis not present

## 2022-03-09 DIAGNOSIS — N401 Enlarged prostate with lower urinary tract symptoms: Secondary | ICD-10-CM | POA: Diagnosis not present

## 2022-03-09 DIAGNOSIS — I5033 Acute on chronic diastolic (congestive) heart failure: Secondary | ICD-10-CM | POA: Diagnosis not present

## 2022-03-10 ENCOUNTER — Telehealth: Payer: Self-pay | Admitting: Orthopedic Surgery

## 2022-03-10 NOTE — Telephone Encounter (Signed)
Mr. Osmundson needs a prescription for new prosthetics from [facility]. Please advise  ?

## 2022-03-10 NOTE — Telephone Encounter (Signed)
Pt sch'd for 03/17/22 with Junie Panning ?

## 2022-03-10 NOTE — Telephone Encounter (Signed)
Can you please call pt to sch appt. Insurance will not cover rx if ot has not been seen with in 6 months and he is almost a year out.  ?

## 2022-03-17 ENCOUNTER — Ambulatory Visit (INDEPENDENT_AMBULATORY_CARE_PROVIDER_SITE_OTHER): Payer: Medicare Other | Admitting: Family

## 2022-03-17 DIAGNOSIS — Z89512 Acquired absence of left leg below knee: Secondary | ICD-10-CM | POA: Diagnosis not present

## 2022-03-17 DIAGNOSIS — Z89111 Acquired absence of right hand: Secondary | ICD-10-CM

## 2022-03-17 DIAGNOSIS — Z89112 Acquired absence of left hand: Secondary | ICD-10-CM | POA: Diagnosis not present

## 2022-03-17 DIAGNOSIS — Z89511 Acquired absence of right leg below knee: Secondary | ICD-10-CM

## 2022-03-24 ENCOUNTER — Ambulatory Visit (INDEPENDENT_AMBULATORY_CARE_PROVIDER_SITE_OTHER): Payer: Medicare Other | Admitting: *Deleted

## 2022-03-24 DIAGNOSIS — I9789 Other postprocedural complications and disorders of the circulatory system, not elsewhere classified: Secondary | ICD-10-CM | POA: Diagnosis not present

## 2022-03-24 DIAGNOSIS — I4891 Unspecified atrial fibrillation: Secondary | ICD-10-CM | POA: Diagnosis not present

## 2022-03-24 DIAGNOSIS — Q231 Congenital insufficiency of aortic valve: Secondary | ICD-10-CM

## 2022-03-24 DIAGNOSIS — Z9889 Other specified postprocedural states: Secondary | ICD-10-CM | POA: Diagnosis not present

## 2022-03-24 DIAGNOSIS — I82603 Acute embolism and thrombosis of unspecified veins of upper extremity, bilateral: Secondary | ICD-10-CM

## 2022-03-24 LAB — POCT INR: INR: 2.1 (ref 2.0–3.0)

## 2022-03-24 NOTE — Patient Instructions (Signed)
Continue warfarin 2 tablets daily except 1 tablet on Tuesdays, Thursdays and Saturdays  Continue Vit K foods Recheck 4 wks 

## 2022-04-01 ENCOUNTER — Encounter: Payer: Self-pay | Admitting: Family

## 2022-04-01 NOTE — Progress Notes (Signed)
? ?Office Visit Note ?  ?Patient: Scott [name]           ?Date of Birth: June 10, 1946           ?MRN: [id number] ?Visit Date: 03/17/2022 ?             ?Requested by: Sasser, Silvestre Moment, MD ?[address] ?[place],  [place] [address] ?PCP: Manon Hilding, MD ? ?No chief complaint on file. ? ? ? ? ?HPI: ?The patient is a 76 year old gentleman who is seen today for concern of ill fitting prostheses.  He is status post bilateral below-knee amputations as well as bilateral hand amputations.  He is right-hand dominant.  He comes in today requesting evaluation and prescription for new prostheses set up ? ?Patient is an existing bilateral transtibial  amputee. ? ?Patient's current comorbidities are not expected to impact the ability to function with the prescribed prosthesis. ?Patient verbally communicates a strong desire to use a prosthesis. ?Patient currently requires mobility aids to ambulate without a prosthesis.  Expects not to use mobility aids with a new prosthesis. ? ?Patient is a K2 level ambulator that will use a prosthesis to walk around their home and the community over low level environmental barriers.  ? ? ? ? ?Assessment & Plan: ?Visit Diagnoses:  ?1. Amputation of both hands with complication, sequela ([place])   ?2. Status post bilateral below knee amputation ([place])   ? ? ?Plan: Given an order for new hand prostheses as well as new below-knee amputation prosthetics. ? ?Follow-Up Instructions: No follow-ups on file.  ? ?Ortho Exam ? ?Patient is alert, oriented, no adenopathy, well-dressed, normal affect, normal respiratory effort. ?Status post right hand amputation.  He does have some callusing in the webspace between the thumb and the index finger.  Amputation stump is in good condition.  Bilateral below-knee residual limbs are well consolidated he does have some superficial ulceration bilaterally from end bearing this is less than a centimeter and without signs of infection ? ?Imaging: ?No results found. ?No images are  attached to the encounter. ? ?Labs: ?Lab Results  ?Component Value Date  ? HGBA1C 6.6 (H) 10/21/2016  ? REPTSTATUS 02/06/2017 FINAL 02/01/2017  ? GRAMSTAIN  02/01/2017  ?  ABUNDANT WBC PRESENT, PREDOMINANTLY PMN ?MODERATE GRAM NEGATIVE RODS ?  ? CULT  02/01/2017  ?  FEW PSEUDOMONAS AERUGINOSA ?MODERATE BACTEROIDES FRAGILIS ?BETA LACTAMASE POSITIVE ?  ? LABORGA PSEUDOMONAS AERUGINOSA 02/01/2017  ? ? ? ?Lab Results  ?Component Value Date  ? ALBUMIN 3.2 (L) 03/11/2017  ? ALBUMIN 2.0 (L) 02/05/2017  ? ALBUMIN 2.3 (L) 12/10/2016  ? PREALBUMIN 10.8 (L) 11/16/2016  ? PREALBUMIN 19.7 10/21/2016  ? ? ?Lab Results  ?Component Value Date  ? MG 1.7 01/19/2022  ? MG 1.7 12/31/2021  ? MG 1.8 08/22/2019  ? ?No results found for: VD25OH ? ?Lab Results  ?Component Value Date  ? PREALBUMIN 10.8 (L) 11/16/2016  ? PREALBUMIN 19.7 10/21/2016  ? ? ?  Latest Ref Rng & Units 03/11/2017  ?  1:50 PM 02/15/2017  ?  7:17 AM 02/05/2017  ? 11:28 PM  ?CBC EXTENDED  ?WBC 4.0 - 10.5 K/uL 6.7   7.1   10.9    ?RBC 4.22 - 5.81 MIL/uL 4.32   4.26   3.42    ?Hemoglobin 13.0 - 17.0 g/dL 11.5   10.6   8.4    ?HCT 39.0 - 52.0 % 36.6   36.0   28.5    ?Platelets 150 -  400 K/uL 174   249   357    ?NEUT# 1.7 - 7.7 K/uL   8.3    ?Lymph# 0.7 - 4.0 K/uL   1.5    ? ? ? ?There is no height or weight on file to calculate BMI. ? ?Orders:  ?No orders of the defined types were placed in this encounter. ? ?No orders of the defined types were placed in this encounter. ? ? ? Procedures: ?No procedures performed ? ?Clinical Data: ?No additional findings. ? ?ROS: ? ?All other systems negative, except as noted in the HPI. ?Review of Systems ? ?Objective: ?Vital Signs: There were no vitals taken for this visit. ? ?Specialty Comments:  ?No specialty comments available. ? ?PMFS History: ?Patient Active Problem List  ? Diagnosis Date Noted  ? Amputation of both hands with complication, subsequent encounter 03/01/2017  ? Diabetes ([place]) 02/19/2017  ? Status post bilateral below knee  amputation ([place]) 02/09/2017  ? Wound disruption, post-op, skin, sequela 02/09/2017  ? Debility 02/05/2017  ? Ganglion upper arm, right   ? Thrombosis of both upper extremities   ? Suspected heparin induced thrombocytopenia (HIT) in hospitalized patient   ? Gangrene of lower extremity ([place])   ? Heparin induced thrombocytopenia (HIT) (HCC)   ? Tracheostomy status ([place])   ? Chest tube in place   ? Atherosclerosis of native arteries of extremities with gangrene, left leg (H[facility])   ? Atherosclerosis of native arteries of extremities with gangrene, right leg ([place])   ? Acute on chronic diastolic CHF (congestive heart failure) ([place])   ? Enteritis due to Clostridium difficile   ? Anasarca   ? FUO (fever of unknown origin)   ? Acute encephalopathy   ? Elevated LFTs   ? DIC (disseminated intravascular coagulation) ([place]) 10/28/2016  ? Postoperative atrial fibrillation ([place]) 10/24/2016  ? Cardiogenic shock ([place])   ? Mitral regurgitation due to cusp prolapse 10/22/2016  ? S/P aortic valve replacement with bioprosthetic valve 10/22/2016  ? S/P ascending aortic aneurysm repair 10/22/2016  ? S/P mitral valve repair 10/22/2016  ? S/P CABG x 3 10/22/2016  ? Thrombocytopenia ([place]) 10/22/2016  ? Acute combined systolic and diastolic congestive heart failure ([place]) 10/22/2016  ? Acute respiratory failure ([place]) 10/22/2016  ? Thoracic ascending aortic aneurysm ([place]) 10/21/2016  ? Coronary artery disease involving native coronary artery of native heart with unstable angina pectoris ([place]) 10/20/2016  ? Severe aortic stenosis by prior echocardiography 10/19/2016  ? Unstable angina ([place]) 10/19/2016  ? Aortic valve stenosis 10/19/2016  ? Bicuspid aortic valve 10/19/2016  ? Trigger ring finger of right hand   ? Wears glasses   ? Gout   ? Hypertension   ? Carpal tunnel syndrome of right wrist   ? Arthritis   ? Snores   ? ?Past Medical History:  ?Diagnosis Date  ? Arthritis   ? "hips, shoulders; knees; back" (10/20/2016)  ? Bicuspid aortic valve   ?  BPH (benign prostatic hypertrophy)   ? Carpal tunnel syndrome of right wrist   ? Coronary artery disease involving native coronary artery of native heart with unstable angina pectoris ([place]) 10/20/2016  ? Diverticulitis   ? GERD (gastroesophageal reflux disease)   ? Gout   ? Heart murmur   ? Hyperlipemia   ? Hypertension   ? Postoperative atrial fibrillation ([place]) 10/24/2016  ? RLS (restless legs syndrome)   ? S/P aortic valve replacement with bioprosthetic valve 10/22/2016  ? 25 mm [facility] Ease bovine pericardial bioprosthetic tissue  valve  ? S/P ascending aortic aneurysm repair 10/22/2016  ? 28 mm supracoronary straight graft replacement of ascending thoracic aortic aneurysm  ? S/P CABG x 3 10/22/2016  ? Sequential LIMA to Diag and LAD, SVG to distal LAD, open vein harvest right thigh  ? S/P mitral valve repair 10/22/2016  ? Artificial Gore-tex neochord placement x6 - posterior annuloplasty band placed but removed due to systolic anterior motion of mitral valve  ? Severe aortic stenosis   ? Snores   ? Never been tested for sleep apnea  ? Thoracic ascending aortic aneurysm ([place]) 10/21/2016  ? Thrombocytopenia ([place]) 10/22/2016  ? Type II diabetes mellitus ([place])   ?  ?Family History  ?Problem Relation Age of Onset  ? Lung cancer Mother   ? Clotting disorder Father   ?     No details  ? Heart disease Sister 32  ?     Stents  ? Cancer Sister   ?     Throat  ?  ?Past Surgical History:  ?Procedure Laterality Date  ? AMPUTATION Bilateral 12/11/2016  ? Procedure: AMPUTATION BELOW KNEE BILATERALLY;  Surgeon: [name], MD;  Location: [place];  Service: Orthopedics;  Laterality: Bilateral;  ? AMPUTATION Bilateral 12/11/2016  ? Procedure: AMPUTATION BILATERAL HANDS EXCEPT RIGHT THUMB;  Surgeon: [name], MD;  Location: [place];  Service: Orthopedics;  Laterality: Bilateral;  ? AORTIC VALVE REPLACEMENT N/A 10/22/2016  ? Procedure: AORTIC VALVE REPLACEMENT (AVR) WITH SIZE 25 MM MAGNA EASE PERICARDIAL BIOPROSTHESIS -  AORTIC;  Surgeon: Rexene Alberts, MD;  Location: [place];  Service: Open Heart Surgery;  Laterality: N/A;  ? BONE EXCISION Right 02/01/2017  ? Procedure: EXCISION RIGHT INDEX METACARPAL HEAD;  Surgeon: Verlee Monte

## 2022-04-15 ENCOUNTER — Other Ambulatory Visit: Payer: Self-pay | Admitting: Cardiology

## 2022-04-15 NOTE — Telephone Encounter (Signed)
Received request for warfarin refill:  Last INR was 2.1 on 03/24/22 Next INR due 04/21/22 LOV was 02/11/22  Zandra Abts MD  Refill approved

## 2022-04-21 ENCOUNTER — Ambulatory Visit (INDEPENDENT_AMBULATORY_CARE_PROVIDER_SITE_OTHER): Payer: Medicare Other | Admitting: *Deleted

## 2022-04-21 DIAGNOSIS — Z9889 Other specified postprocedural states: Secondary | ICD-10-CM | POA: Diagnosis not present

## 2022-04-21 DIAGNOSIS — I4891 Unspecified atrial fibrillation: Secondary | ICD-10-CM | POA: Diagnosis not present

## 2022-04-21 DIAGNOSIS — I9789 Other postprocedural complications and disorders of the circulatory system, not elsewhere classified: Secondary | ICD-10-CM

## 2022-04-21 DIAGNOSIS — I82603 Acute embolism and thrombosis of unspecified veins of upper extremity, bilateral: Secondary | ICD-10-CM

## 2022-04-21 DIAGNOSIS — Q231 Congenital insufficiency of aortic valve: Secondary | ICD-10-CM | POA: Diagnosis not present

## 2022-04-21 DIAGNOSIS — Z5181 Encounter for therapeutic drug level monitoring: Secondary | ICD-10-CM | POA: Diagnosis not present

## 2022-04-21 LAB — POCT INR: INR: 2.1 (ref 2.0–3.0)

## 2022-04-21 NOTE — Patient Instructions (Signed)
Continue warfarin 2 tablets daily except 1 tablet on Tuesdays, Thursdays and Saturdays  Continue Vit K foods Recheck 6 wks 

## 2022-05-18 ENCOUNTER — Other Ambulatory Visit: Payer: Self-pay | Admitting: Cardiology

## 2022-06-02 ENCOUNTER — Encounter: Payer: Self-pay | Admitting: *Deleted

## 2022-06-02 ENCOUNTER — Ambulatory Visit (INDEPENDENT_AMBULATORY_CARE_PROVIDER_SITE_OTHER): Payer: Medicare Other | Admitting: Cardiology

## 2022-06-02 ENCOUNTER — Encounter: Payer: Self-pay | Admitting: Cardiology

## 2022-06-02 ENCOUNTER — Ambulatory Visit (INDEPENDENT_AMBULATORY_CARE_PROVIDER_SITE_OTHER): Payer: Medicare Other | Admitting: *Deleted

## 2022-06-02 VITALS — BP 92/60 | HR 81 | Ht 69.0 in | Wt 185.2 lb

## 2022-06-02 DIAGNOSIS — I4891 Unspecified atrial fibrillation: Secondary | ICD-10-CM | POA: Diagnosis not present

## 2022-06-02 DIAGNOSIS — Q231 Congenital insufficiency of aortic valve: Secondary | ICD-10-CM

## 2022-06-02 DIAGNOSIS — I9789 Other postprocedural complications and disorders of the circulatory system, not elsewhere classified: Secondary | ICD-10-CM

## 2022-06-02 DIAGNOSIS — I5032 Chronic diastolic (congestive) heart failure: Secondary | ICD-10-CM | POA: Diagnosis not present

## 2022-06-02 DIAGNOSIS — I2510 Atherosclerotic heart disease of native coronary artery without angina pectoris: Secondary | ICD-10-CM | POA: Diagnosis not present

## 2022-06-02 DIAGNOSIS — I82603 Acute embolism and thrombosis of unspecified veins of upper extremity, bilateral: Secondary | ICD-10-CM

## 2022-06-02 DIAGNOSIS — Z9889 Other specified postprocedural states: Secondary | ICD-10-CM

## 2022-06-02 LAB — POCT INR: INR: 1.7 — AB (ref 2.0–3.0)

## 2022-06-02 MED ORDER — METOLAZONE 2.5 MG PO TABS: ORAL_TABLET | ORAL | Status: DC

## 2022-06-02 NOTE — Patient Instructions (Signed)
Medication Instructions:  Change your Metolazone to 2.'5mg'$  on Wednesday's and only as needed on Saturday as needed for weight gain or swellihng  Continue all other medications.     Labwork: none  Testing/Procedures: none  Follow-Up: 6 months   Any Other Special Instructions Will Be Listed Below (If Applicable).   If you need a refill on your cardiac medications before your next appointment, please call your pharmacy.

## 2022-06-02 NOTE — Progress Notes (Signed)
Clinical Summary Scott [name] is a 76 y.o.male seen today for follow up of the following medical problems.    1. Aortic stenosis - history of bicuspid AV - history of AVR 10/22/2016,  [facility] Ease Pericardial Tissue Valve (size 12m, model # 3300TFX, serial # [id number] along with 3 vessel CABG, MV repair, and repair of ascending aortic aneurysm     - OR on 10/22/16 for CABG X 3, AVR, MVR, repair of thoracic ascending aneurysm with placement of VAC on open chest Post op course significant for cardiogenic shock requiring multiple pressors, SAM requiring removal of mitral annuloplasty ring,  persistent fevers, encephalopathy, shocked liver, DIC due to consumptive intraoperative coagulopathy, diffuse clotting with ischemia of distal BUE/BLE extremities, A fib, C diff colitis, volume overload and VDRF with difficulty with vent wean requiring tracheostomy.  Sternal wound closed on 12/4 and was started on coumadin for RV thrombus and A fib.     [facility] bacteremia, pneumonitis, intermittent fevers, dysphagia requiring PEG tube placement on 1/15 by Dr. YKathlene Cote He developed progressive gangrenous changes of BUE and BLE requiring B-transtibial amputation , left transmetacarpal amputation and Right MCP amputation of right hand by Dr. DSharol Givenon 1/19.    01/2017 echo: 55-60%, no WMAs, grade II diastolic dysfunction. Normal functioning AV.   07/2019 echo: LVEF 55-60%, mild RV dysfunction, mild to mod MR   Jan 2023 echo: LVE 550-93% indet diastolic fxn, mod RV dysfucntion, mod PASP, severe BAE, mild to mod MR, mod MS. Normal AVR. Dilated fixed IVC       2. SOB/Acute on chronic diastolic HF  Jan 22671echo: LVE 524-58% indet diastolic fxn, mod RV dysfucntion, mod PASP, severe BAE, mild to mod MR, mod MS. Normal AVR. Dilated fixed IVC - Dec 15 2021: Cr 1.23 BUN 25 BNP 2994    - weight today 185 lbs, 01/2022 was 221 lbs - edema has resolved - taking torsemide '80mg'$  bid,  metolazone 2.'5mg'$  Wed, Sat - reports recent labs with pcp      2. Mitral regurgitation - patient had MV ring placed during his surgery 10/22/16 along with AVR, ascending thoracic aneurysm repair, and CABG. - ring was subsequently removed during that operation [facility]: LVE 509-98% indet diastolic fxn, mod RV dysfucntion, mod PASP, severe BAE, mild to mod MR, mod MS. Normal AVR. Dilated fixed IVC       3. CAD - history of CABG 10/22/2016 (LIMA-Diag and distal LAD, SVG-LAD) 01/2017 echo: 55-60%, no WMAs, grade II diastolic dysfunction. Normal functioning AV.     - no chest pain   4. Ischemic limbs - occurred in setting of cardiogenic shock requiring high dose pressors as well as DIC  - required amputation - followed by ortho. - has been on coumadin, ASA   5.Afib/aflutter - initally had post op afib - noted to be in aflutter at clinical f/u and thus was committed to long term anticoagulation  08/2019 monitor no significant arrhytmias     - 07/2019 ekg afib - 11/27/20 EKG SR - 1 2023 EKG afib   - no recent palpitaitons - no bleeding on coumadin    6. Hyperlipidemia - 02/2021 TC 99 TG 227 HDL 30 LDL 33 - he is on atorvastatin - recent labs with pcp    Past Medical History:  Diagnosis Date   Arthritis    "hips, shoulders; knees; back" (10/20/2016)   Bicuspid aortic valve    BPH (  benign prostatic hypertrophy)    Carpal tunnel syndrome of right wrist    Coronary artery disease involving native coronary artery of native heart with unstable angina pectoris ([place]) 10/20/2016   Diverticulitis    GERD (gastroesophageal reflux disease)    Gout    Heart murmur    Hyperlipemia    Hypertension    Postoperative atrial fibrillation ([place]) 10/24/2016   RLS (restless legs syndrome)    S/P aortic valve replacement with bioprosthetic valve 10/22/2016   25 mm [facility] Ease bovine pericardial bioprosthetic tissue valve   S/P ascending aortic aneurysm  repair 10/22/2016   28 mm supracoronary straight graft replacement of ascending thoracic aortic aneurysm   S/P CABG x 3 10/22/2016   Sequential LIMA to Diag and LAD, SVG to distal LAD, open vein harvest right thigh   S/P mitral valve repair 10/22/2016   Artificial Gore-tex neochord placement x6 - posterior annuloplasty band placed but removed due to systolic anterior motion of mitral valve   Severe aortic stenosis    Snores    Never been tested for sleep apnea   Thoracic ascending aortic aneurysm ([place]) 10/21/2016   Thrombocytopenia ([place]) 10/22/2016   Type II diabetes mellitus (HCC)      Allergies  Allergen Reactions   Sulfa Antibiotics Hives     Current Outpatient Medications  Medication Sig Dispense Refill   allopurinol (ZYLOPRIM) 300 MG tablet Take 300 mg by mouth daily.     aspirin EC 81 MG tablet Take 81 mg by mouth daily.     atorvastatin (LIPITOR) 40 MG tablet TAKE 1 TABLET DAILY AT 6PM 90 tablet 3   KLOR-CON M20 20 MEQ tablet TAKE 1 TABLET TWICE A DAY 180 tablet 3   Lactobacillus (PROBIOTIC ACIDOPHILUS PO) Take 1 capsule by mouth daily.     lisinopril (ZESTRIL) 2.5 MG tablet TAKE 1 TABLET DAILY 90 tablet 3   Magnesium Oxide (MAG-OX 400 PO) Take 400 mg by mouth 2 (two) times daily.     Melatonin 10 MG CAPS Take 10 mg by mouth at bedtime.      metFORMIN (GLUCOPHAGE) 500 MG tablet Take 500 mg by mouth daily with breakfast. & 1000 mg by mouth in the evening     metolazone (ZAROXOLYN) 2.5 MG tablet Take 1 tablet (2.5 mg total) by mouth 2 (two) times a week. On Wednesday & Saturday 24 tablet 1   metoprolol succinate (TOPROL-XL) 100 MG 24 hr tablet Take 1 tablet (100 mg total) by mouth 2 (two) times daily. Take with or immediately following a meal. 180 tablet 3   MISC NATURAL PRODUCTS PO Take 1 tablet by mouth 2 (two) times daily. Sucontral D - natural product for healthy blood sugar levels     nitroGLYCERIN (NITROSTAT) 0.4 MG SL tablet Place 1 tablet (0.4 mg total) under the  tongue every 5 (five) minutes as needed. 25 tablet 3   rOPINIRole (REQUIP) 2 MG tablet Take 2 mg by mouth See admin instructions. Takes '2mg'$  at 4:30pm and '2mg'$  at 9:30pm.     tamsulosin (FLOMAX) 0.4 MG CAPS capsule Take 0.4 mg by mouth daily.     torsemide (DEMADEX) 20 MG tablet Take 4 tablets (80 mg total) by mouth 2 (two) times daily. 360 tablet 3   warfarin (COUMADIN) 2 MG tablet TAKE 2 TABLETS BY MOUTH DAILY OR AS DIRECTED 180 tablet 2   No current facility-administered medications for this visit.     Past Surgical History:  Procedure Laterality Date  AMPUTATION Bilateral 12/11/2016   Procedure: AMPUTATION BELOW KNEE BILATERALLY;  Surgeon: [name], MD;  Location: [place];  Service: Orthopedics;  Laterality: Bilateral;   AMPUTATION Bilateral 12/11/2016   Procedure: AMPUTATION BILATERAL HANDS EXCEPT RIGHT THUMB;  Surgeon: [name], MD;  Location: [place];  Service: Orthopedics;  Laterality: Bilateral;   AORTIC VALVE REPLACEMENT N/A 10/22/2016   Procedure: AORTIC VALVE REPLACEMENT (AVR) WITH SIZE 25 MM MAGNA EASE PERICARDIAL BIOPROSTHESIS - AORTIC;  Surgeon: Rexene Alberts, MD;  Location: [place];  Service: Open Heart Surgery;  Laterality: N/A;   BONE EXCISION Right 02/01/2017   Procedure: EXCISION RIGHT INDEX METACARPAL HEAD;  Surgeon: [name], MD;  Location: [place];  Service: Orthopedics;  Laterality: Right;   CARDIAC CATHETERIZATION N/A 10/20/2016   Procedure: Right/Left Heart Cath and Coronary Angiography;  Surgeon: Leonie Man, MD;  Location: [place] [facility];  Service: Cardiovascular;  Laterality: N/A;   CARPAL TUNNEL RELEASE Right 11/28/2013   Procedure: RIGHT WRIST CARPAL TUNNEL RELEASE;  Surgeon: Lorn Junes, MD;  Location: [place];  Service: Orthopedics;  Laterality: Right;   CATARACT EXTRACTION W/ INTRAOCULAR LENS  IMPLANT, BILATERAL Bilateral 1978   COLONOSCOPY     CORONARY ARTERY BYPASS GRAFT N/A 10/22/2016   Procedure: CORONARY ARTERY BYPASS  GRAFTING (CABG)x 2 WITH LIMA TO DIAGONAL, OPEN  HARVESTING OF RIGHT SAPHENOUS VEIN FOR VEIN GRAFT TO LAD;  Surgeon: Rexene Alberts, MD;  Location: [place];  Service: Open Heart Surgery;  Laterality: N/A;   FRACTURE SURGERY     INGUINAL HERNIA REPAIR Right 1998   IR GENERIC HISTORICAL  12/07/2016   IR US GUIDE VASC ACCESS RIGHT 12/07/2016 Aletta Edouard, MD [facility]   IR GENERIC HISTORICAL  12/07/2016   IR RADIOLOGY PERIPHERAL GUIDED IV START 12/07/2016 Aletta Edouard, MD [facility]   IR GENERIC HISTORICAL  12/07/2016   IR GASTROSTOMY TUBE MOD SED 12/07/2016 Aletta Edouard, MD [facility]   LIPOMA EXCISION Right 2008   "side of my head"   MITRAL VALVE REPAIR N/A 10/22/2016   Procedure: MITRAL VALVE REPAIR (MVR) WITH SIZE 30 SORIN ANNULOFLEX ANNULOPLASTY RING WITH SUBSEQUENT REMOVAL OF RING;  Surgeon: Rexene Alberts, MD;  Location: [place];  Service: Open Heart Surgery;  Laterality: N/A;   PENECTOMY  2007   Peyronie's disease    PENILE PROSTHESIS IMPLANT  2009   REMOVAL OF PENILE PROSTHESIS N/A 02/01/2017   Procedure: REMOVAL OF PENILE PROSTHESIS;  Surgeon: Kathie Rhodes, MD;  Location: [place];  Service: Urology;  Laterality: N/A;   SHOULDER OPEN ROTATOR CUFF REPAIR Right 2006   STERNAL CLOSURE N/A 10/26/2016   Procedure: STERNAL WASHOUT AND DELAYED PRIMARY CLOSURE;  Surgeon: Rexene Alberts, MD;  Location: [place];  Service: Thoracic;  Laterality: N/A;   TEE WITHOUT CARDIOVERSION N/A 10/26/2016   Procedure: TRANSESOPHAGEAL ECHOCARDIOGRAM (TEE);  Surgeon: Rexene Alberts, MD;  Location: [place];  Service: Thoracic;  Laterality: N/A;   TEE WITHOUT CARDIOVERSION N/A 10/22/2016   Procedure: TRANSESOPHAGEAL ECHOCARDIOGRAM (TEE);  Surgeon: Rexene Alberts, MD;  Location: [place];  Service: Open Heart Surgery;  Laterality: N/A;   THORACIC AORTIC ANEURYSM REPAIR  10/22/2016   Procedure: ASCENDING AORTIC  ANEURYSM REPAIR (AAA) WITH 28 MM HEMASHIELD PLATINUM WOVEN DOUBLE VELOUR VASCULAR GRAFT;  Surgeon:  Rexene Alberts, MD;  Location: [place];  Service: Open Heart Surgery;;   TONSILLECTOMY  ~ [place] N/A 11/09/2016   Procedure:  TRACHEOSTOMY;  Surgeon: Rexene Alberts, MD;  Location: [place];  Service: Thoracic;  Laterality: N/A;   TRANSURETHRAL RESECTION OF PROSTATE  2005   TRIGGER FINGER RELEASE Bilateral    several lt and rt hands   TRIGGER FINGER RELEASE Right 11/28/2013   Procedure: RIGHT TRIGGER FINGER  RELEASE (TENDON SHEATH INCISION);  Surgeon: Lorn Junes, MD;  Location: [place];  Service: Orthopedics;  Laterality: Right;   VIDEO BRONCHOSCOPY N/A 11/09/2016   Procedure: VIDEO BRONCHOSCOPY;  Surgeon: Rexene Alberts, MD;  Location: [facility] OR;  Service: Thoracic;  Laterality: N/A;   WRIST FRACTURE SURGERY Right ~ 1959     Allergies  Allergen Reactions   Sulfa Antibiotics Hives      Family History  Problem Relation Age of Onset   Lung cancer Mother    Clotting disorder Father        No details   Heart disease Sister 19       Stents   Cancer Sister        Throat     Social History Mr. Swigert reports that he quit smoking about 39 years ago. His smoking use included pipe and cigars. He has never used smokeless tobacco. Mr. Blanford reports current alcohol use of about 10.0 standard drinks of alcohol per week.   Review of Systems CONSTITUTIONAL: No weight loss, fever, chills, weakness or fatigue.  HEENT: Eyes: No visual loss, blurred vision, double vision or yellow sclerae.No hearing loss, sneezing, congestion, runny nose or sore throat.  SKIN: No rash or itching.  CARDIOVASCULAR: per hpi RESPIRATORY: No shortness of breath, cough or sputum.  GASTROINTESTINAL: No anorexia, nausea, vomiting or diarrhea. No abdominal pain or blood.  GENITOURINARY: No burning on urination, no polyuria NEUROLOGICAL: No headache, dizziness, syncope, paralysis, ataxia, numbness or tingling in the extremities. No change in bowel or bladder control.   MUSCULOSKELETAL: No muscle, back pain, joint pain or stiffness.  LYMPHATICS: No enlarged nodes. No history of splenectomy.  PSYCHIATRIC: No history of depression or anxiety.  ENDOCRINOLOGIC: No reports of sweating, cold or heat intolerance. No polyuria or polydipsia.  Marland Kitchen   Physical Examination Today's Vitals   06/02/22 1528  BP: 92/60  Pulse: 81  SpO2: 96%  Weight: 185 lb 3.2 oz (84 kg)  Height: '5\' 9"'$  (1.753 m)   Body mass index is 27.35 kg/m.  Gen: resting comfortably, no acute distress HEENT: no scleral icterus, pupils equal round and reactive, no palptable cervical adenopathy,  CV: RRR, no m/r/g no jvd Resp: Clear to auscultation bilaterally GI: abdomen is soft, non-tender, non-distended, normal bowel sounds, no hepatosplenomegaly MSK: extremities are warm, no edema.  Skin: warm, no rash Neuro:  no focal deficits Psych: appropriate affect   Diagnostic Studies  07/2019 echo 1. The left ventricle has normal systolic function, with an ejection fraction of 55-60%. The cavity size was normal. There is moderately increased left ventricular wall thickness. Left ventricular diastolic Doppler parameters are indeterminate.  2. The right ventricle has mildly reduced systolic function. The cavity was moderately enlarged. There is not assessed.  3. The ventricular septum flattens in systole consistent with RV pressure overload.  4. Left atrial size was severely dilated.  5. Right atrial size was mildly dilated.  6. Mitral valve regurgitation mild to moderate MR, the jet is eccentric and may be underestimated. No evidence of mitral valve stenosis.  7. The aortic valve was not well visualized. No stenosis of the aortic valve.  8. [facility] Ease  Pericardial Tissue Valve (size 67m, model # 3300TFX is in the AV position.  9. The aorta is normal unless otherwise noted. 10. The aortic root is normal in size and structure. 11. Pulmonary hypertension is indeterminate, inadequate TR  jet. 12. The inferior vena cava was dilated in size with >50% respiratory variability.     Jan 2023 echo 1. Left ventricular ejection fraction, by estimation, is 55 to 60%. The  left ventricle has normal function. Left ventricular endocardial border  not optimally defined to evaluate regional wall motion. Left ventricular  diastolic function could not be  evaluated.   2. Right ventricular systolic function is moderately reduced. The right  ventricular size is moderately enlarged. There is moderately elevated  pulmonary artery systolic pressure. The estimated right ventricular  systolic pressure is 513.2mmHg.   3. Left atrial size was severely dilated.   4. Right atrial size was severely dilated.   5. The mitral valve is degenerative. Mild to moderate mitral valve  regurgitation. Mild mitral stenosis. The mean mitral valve gradient is 7.0  mmHg. Moderate mitral annular calcification.   6. The aortic valve has been repaired/replaced. There is a 25 mm [place]  [place]agna Pericardial Tissue valve present in the aortic position.      Aortic valve regurgitation is not visualized. No aortic stenosis is  present. Aortic valve area, by VTI measures 2.42 cm. Aortic valve mean  gradient measures 5.0 mmHg. Aortic valve Vmax measures 1.64 m/s.   7. Aortic root/ascending aorta has been repaired/replaced with normal  dimensions.   8. The inferior vena cava is dilated in size with <50% respiratory  variability, suggesting right atrial pressure of 15 mmHg.   9. There is a trivial pericardial effusion that is circumferential.        Assessment and Plan  1.Chronic diastolic - repeat echo as reported above, no significant changes - euvoelmic today, significant decrease in weight. Will change his metoalzone to Wed 2.'5mg'$ , can take just prn Saturdays as needed  2. CAD - no symptoms, continue current meds      JArnoldo Lenis M.D.

## 2022-06-02 NOTE — Patient Instructions (Signed)
Take warfarin 2 tablets tonight then increase dose to 2 tablets daily except 1 tablet on Tuesdays and Saturdays  Continue Vit K foods Recheck 4 wks

## 2022-06-14 ENCOUNTER — Other Ambulatory Visit: Payer: Self-pay | Admitting: Cardiology

## 2022-06-30 ENCOUNTER — Ambulatory Visit (INDEPENDENT_AMBULATORY_CARE_PROVIDER_SITE_OTHER): Payer: Medicare Other | Admitting: *Deleted

## 2022-06-30 DIAGNOSIS — I82603 Acute embolism and thrombosis of unspecified veins of upper extremity, bilateral: Secondary | ICD-10-CM | POA: Diagnosis not present

## 2022-06-30 DIAGNOSIS — E7800 Pure hypercholesterolemia, unspecified: Secondary | ICD-10-CM | POA: Diagnosis not present

## 2022-06-30 DIAGNOSIS — E1159 Type 2 diabetes mellitus with other circulatory complications: Secondary | ICD-10-CM | POA: Diagnosis not present

## 2022-06-30 DIAGNOSIS — Q231 Congenital insufficiency of aortic valve: Secondary | ICD-10-CM | POA: Diagnosis not present

## 2022-06-30 DIAGNOSIS — E7849 Other hyperlipidemia: Secondary | ICD-10-CM | POA: Diagnosis not present

## 2022-06-30 DIAGNOSIS — Z5181 Encounter for therapeutic drug level monitoring: Secondary | ICD-10-CM | POA: Diagnosis not present

## 2022-06-30 DIAGNOSIS — M109 Gout, unspecified: Secondary | ICD-10-CM | POA: Diagnosis not present

## 2022-06-30 DIAGNOSIS — Z9889 Other specified postprocedural states: Secondary | ICD-10-CM

## 2022-06-30 DIAGNOSIS — I4891 Unspecified atrial fibrillation: Secondary | ICD-10-CM | POA: Diagnosis not present

## 2022-06-30 DIAGNOSIS — I10 Essential (primary) hypertension: Secondary | ICD-10-CM | POA: Diagnosis not present

## 2022-06-30 DIAGNOSIS — E782 Mixed hyperlipidemia: Secondary | ICD-10-CM | POA: Diagnosis not present

## 2022-06-30 DIAGNOSIS — K2100 Gastro-esophageal reflux disease with esophagitis, without bleeding: Secondary | ICD-10-CM | POA: Diagnosis not present

## 2022-06-30 DIAGNOSIS — E7801 Familial hypercholesterolemia: Secondary | ICD-10-CM | POA: Diagnosis not present

## 2022-06-30 DIAGNOSIS — I9789 Other postprocedural complications and disorders of the circulatory system, not elsewhere classified: Secondary | ICD-10-CM | POA: Diagnosis not present

## 2022-06-30 LAB — POCT INR: INR: 3 (ref 2.0–3.0)

## 2022-06-30 NOTE — Patient Instructions (Signed)
Continue warfarin  2 tablets daily except 1 tablet on Tuesdays and Saturdays  Continue Vit K foods Recheck 4 wks

## 2022-07-02 DIAGNOSIS — I5033 Acute on chronic diastolic (congestive) heart failure: Secondary | ICD-10-CM | POA: Diagnosis not present

## 2022-07-02 DIAGNOSIS — N401 Enlarged prostate with lower urinary tract symptoms: Secondary | ICD-10-CM | POA: Diagnosis not present

## 2022-07-02 DIAGNOSIS — R197 Diarrhea, unspecified: Secondary | ICD-10-CM | POA: Diagnosis not present

## 2022-07-02 DIAGNOSIS — E1140 Type 2 diabetes mellitus with diabetic neuropathy, unspecified: Secondary | ICD-10-CM | POA: Diagnosis not present

## 2022-07-02 DIAGNOSIS — E7849 Other hyperlipidemia: Secondary | ICD-10-CM | POA: Diagnosis not present

## 2022-07-02 DIAGNOSIS — I10 Essential (primary) hypertension: Secondary | ICD-10-CM | POA: Diagnosis not present

## 2022-07-02 DIAGNOSIS — M1A9XX0 Chronic gout, unspecified, without tophus (tophi): Secondary | ICD-10-CM | POA: Diagnosis not present

## 2022-07-02 DIAGNOSIS — E6609 Other obesity due to excess calories: Secondary | ICD-10-CM | POA: Diagnosis not present

## 2022-07-02 DIAGNOSIS — M545 Low back pain, unspecified: Secondary | ICD-10-CM | POA: Diagnosis not present

## 2022-07-02 DIAGNOSIS — Z8679 Personal history of other diseases of the circulatory system: Secondary | ICD-10-CM | POA: Diagnosis not present

## 2022-07-02 DIAGNOSIS — G2581 Restless legs syndrome: Secondary | ICD-10-CM | POA: Diagnosis not present

## 2022-07-28 ENCOUNTER — Ambulatory Visit: Payer: Medicare Other | Attending: Cardiology | Admitting: *Deleted

## 2022-07-28 DIAGNOSIS — Z9889 Other specified postprocedural states: Secondary | ICD-10-CM | POA: Insufficient documentation

## 2022-07-28 DIAGNOSIS — Q231 Congenital insufficiency of aortic valve: Secondary | ICD-10-CM | POA: Diagnosis not present

## 2022-07-28 DIAGNOSIS — I82603 Acute embolism and thrombosis of unspecified veins of upper extremity, bilateral: Secondary | ICD-10-CM | POA: Diagnosis not present

## 2022-07-28 DIAGNOSIS — I9789 Other postprocedural complications and disorders of the circulatory system, not elsewhere classified: Secondary | ICD-10-CM | POA: Insufficient documentation

## 2022-07-28 DIAGNOSIS — Z953 Presence of xenogenic heart valve: Secondary | ICD-10-CM | POA: Diagnosis not present

## 2022-07-28 DIAGNOSIS — Z5181 Encounter for therapeutic drug level monitoring: Secondary | ICD-10-CM | POA: Insufficient documentation

## 2022-07-28 DIAGNOSIS — I4891 Unspecified atrial fibrillation: Secondary | ICD-10-CM | POA: Insufficient documentation

## 2022-07-28 LAB — POCT INR: INR: 3.4 — AB (ref 2.0–3.0)

## 2022-07-28 NOTE — Patient Instructions (Signed)
Hold warfarin tonight then decrease dose to 2 tablets daily except 1 tablet on Tuesdays, Thursdays and Saturdays  Continue Vit K foods Recheck 4 wks

## 2022-08-22 DIAGNOSIS — E1165 Type 2 diabetes mellitus with hyperglycemia: Secondary | ICD-10-CM | POA: Diagnosis not present

## 2022-08-22 DIAGNOSIS — E782 Mixed hyperlipidemia: Secondary | ICD-10-CM | POA: Diagnosis not present

## 2022-08-22 DIAGNOSIS — I10 Essential (primary) hypertension: Secondary | ICD-10-CM | POA: Diagnosis not present

## 2022-08-25 ENCOUNTER — Ambulatory Visit: Payer: Medicare Other | Attending: Cardiology | Admitting: *Deleted

## 2022-08-25 DIAGNOSIS — Q231 Congenital insufficiency of aortic valve: Secondary | ICD-10-CM

## 2022-08-25 DIAGNOSIS — Z9889 Other specified postprocedural states: Secondary | ICD-10-CM

## 2022-08-25 DIAGNOSIS — I4891 Unspecified atrial fibrillation: Secondary | ICD-10-CM

## 2022-08-25 DIAGNOSIS — I9789 Other postprocedural complications and disorders of the circulatory system, not elsewhere classified: Secondary | ICD-10-CM

## 2022-08-25 DIAGNOSIS — I82603 Acute embolism and thrombosis of unspecified veins of upper extremity, bilateral: Secondary | ICD-10-CM

## 2022-08-25 DIAGNOSIS — Z5181 Encounter for therapeutic drug level monitoring: Secondary | ICD-10-CM | POA: Diagnosis not present

## 2022-08-25 LAB — POCT INR: INR: 2 (ref 2.0–3.0)

## 2022-08-25 NOTE — Patient Instructions (Signed)
Continue warfarin 2 tablets daily except 1 tablet on Tuesdays, Thursdays and Saturdays  Continue Vit K foods Recheck 4 wks

## 2022-09-22 ENCOUNTER — Ambulatory Visit: Payer: Medicare Other | Attending: Cardiology | Admitting: *Deleted

## 2022-09-22 DIAGNOSIS — I4891 Unspecified atrial fibrillation: Secondary | ICD-10-CM | POA: Diagnosis not present

## 2022-09-22 DIAGNOSIS — Q231 Congenital insufficiency of aortic valve: Secondary | ICD-10-CM | POA: Diagnosis not present

## 2022-09-22 DIAGNOSIS — Z953 Presence of xenogenic heart valve: Secondary | ICD-10-CM | POA: Diagnosis not present

## 2022-09-22 DIAGNOSIS — I9789 Other postprocedural complications and disorders of the circulatory system, not elsewhere classified: Secondary | ICD-10-CM | POA: Diagnosis not present

## 2022-09-22 DIAGNOSIS — I82603 Acute embolism and thrombosis of unspecified veins of upper extremity, bilateral: Secondary | ICD-10-CM | POA: Diagnosis not present

## 2022-09-22 DIAGNOSIS — Z9889 Other specified postprocedural states: Secondary | ICD-10-CM

## 2022-09-22 DIAGNOSIS — Z5181 Encounter for therapeutic drug level monitoring: Secondary | ICD-10-CM | POA: Diagnosis not present

## 2022-09-22 LAB — POCT INR: INR: 2.4 (ref 2.0–3.0)

## 2022-09-22 NOTE — Patient Instructions (Signed)
Continue warfarin 2 tablets daily except 1 tablet on Tuesdays, Thursdays and Saturdays  Continue Vit K foods Recheck 6 wks

## 2022-10-31 ENCOUNTER — Other Ambulatory Visit: Payer: Self-pay | Admitting: Cardiology

## 2022-10-31 DIAGNOSIS — Z23 Encounter for immunization: Secondary | ICD-10-CM | POA: Diagnosis not present

## 2022-11-02 DIAGNOSIS — E1165 Type 2 diabetes mellitus with hyperglycemia: Secondary | ICD-10-CM | POA: Diagnosis not present

## 2022-11-02 DIAGNOSIS — E782 Mixed hyperlipidemia: Secondary | ICD-10-CM | POA: Diagnosis not present

## 2022-11-02 DIAGNOSIS — E7849 Other hyperlipidemia: Secondary | ICD-10-CM | POA: Diagnosis not present

## 2022-11-02 DIAGNOSIS — I10 Essential (primary) hypertension: Secondary | ICD-10-CM | POA: Diagnosis not present

## 2022-11-02 DIAGNOSIS — K2100 Gastro-esophageal reflux disease with esophagitis, without bleeding: Secondary | ICD-10-CM | POA: Diagnosis not present

## 2022-11-02 NOTE — Telephone Encounter (Signed)
Refill request for warfarin:  Last INR was 2.4 on 09/22/22 Next INR due 11/03/22 LOV was 06/02/22  Zandra Abts MD  Refill approved.

## 2022-11-03 ENCOUNTER — Ambulatory Visit: Payer: Medicare Other | Attending: Cardiology | Admitting: *Deleted

## 2022-11-03 DIAGNOSIS — I4891 Unspecified atrial fibrillation: Secondary | ICD-10-CM | POA: Diagnosis not present

## 2022-11-03 DIAGNOSIS — Z9889 Other specified postprocedural states: Secondary | ICD-10-CM | POA: Diagnosis not present

## 2022-11-03 DIAGNOSIS — I82603 Acute embolism and thrombosis of unspecified veins of upper extremity, bilateral: Secondary | ICD-10-CM

## 2022-11-03 DIAGNOSIS — Q231 Congenital insufficiency of aortic valve: Secondary | ICD-10-CM

## 2022-11-03 DIAGNOSIS — I9789 Other postprocedural complications and disorders of the circulatory system, not elsewhere classified: Secondary | ICD-10-CM

## 2022-11-03 LAB — POCT INR: INR: 2.2 (ref 2.0–3.0)

## 2022-11-03 NOTE — Patient Instructions (Signed)
Continue warfarin 2 tablets daily except 1 tablet on Tuesdays, Thursdays and Saturdays  Continue Vit K foods Recheck 6 wks

## 2022-11-10 DIAGNOSIS — E1140 Type 2 diabetes mellitus with diabetic neuropathy, unspecified: Secondary | ICD-10-CM | POA: Diagnosis not present

## 2022-11-10 DIAGNOSIS — Z23 Encounter for immunization: Secondary | ICD-10-CM | POA: Diagnosis not present

## 2022-11-10 DIAGNOSIS — I5033 Acute on chronic diastolic (congestive) heart failure: Secondary | ICD-10-CM | POA: Diagnosis not present

## 2022-11-10 DIAGNOSIS — E6609 Other obesity due to excess calories: Secondary | ICD-10-CM | POA: Diagnosis not present

## 2022-11-10 DIAGNOSIS — I10 Essential (primary) hypertension: Secondary | ICD-10-CM | POA: Diagnosis not present

## 2022-11-10 DIAGNOSIS — R197 Diarrhea, unspecified: Secondary | ICD-10-CM | POA: Diagnosis not present

## 2022-11-10 DIAGNOSIS — M1A9XX0 Chronic gout, unspecified, without tophus (tophi): Secondary | ICD-10-CM | POA: Diagnosis not present

## 2022-11-10 DIAGNOSIS — Z8679 Personal history of other diseases of the circulatory system: Secondary | ICD-10-CM | POA: Diagnosis not present

## 2022-11-10 DIAGNOSIS — N401 Enlarged prostate with lower urinary tract symptoms: Secondary | ICD-10-CM | POA: Diagnosis not present

## 2022-11-10 DIAGNOSIS — E7849 Other hyperlipidemia: Secondary | ICD-10-CM | POA: Diagnosis not present

## 2022-11-10 DIAGNOSIS — G2581 Restless legs syndrome: Secondary | ICD-10-CM | POA: Diagnosis not present

## 2022-11-10 DIAGNOSIS — Z6827 Body mass index (BMI) 27.0-27.9, adult: Secondary | ICD-10-CM | POA: Diagnosis not present

## 2022-11-23 DIAGNOSIS — M109 Gout, unspecified: Secondary | ICD-10-CM | POA: Insufficient documentation

## 2022-11-23 DIAGNOSIS — N400 Enlarged prostate without lower urinary tract symptoms: Secondary | ICD-10-CM | POA: Insufficient documentation

## 2022-11-23 DIAGNOSIS — N529 Male erectile dysfunction, unspecified: Secondary | ICD-10-CM | POA: Insufficient documentation

## 2022-11-23 DIAGNOSIS — M810 Age-related osteoporosis without current pathological fracture: Secondary | ICD-10-CM | POA: Insufficient documentation

## 2022-11-23 DIAGNOSIS — E7800 Pure hypercholesterolemia, unspecified: Secondary | ICD-10-CM | POA: Insufficient documentation

## 2022-11-23 DIAGNOSIS — M1990 Unspecified osteoarthritis, unspecified site: Secondary | ICD-10-CM | POA: Insufficient documentation

## 2022-11-23 DIAGNOSIS — G2581 Restless legs syndrome: Secondary | ICD-10-CM | POA: Insufficient documentation

## 2022-11-23 DIAGNOSIS — B009 Herpesviral infection, unspecified: Secondary | ICD-10-CM | POA: Insufficient documentation

## 2022-11-23 DIAGNOSIS — F1090 Alcohol use, unspecified, uncomplicated: Secondary | ICD-10-CM | POA: Insufficient documentation

## 2022-11-23 DIAGNOSIS — Z89511 Acquired absence of right leg below knee: Secondary | ICD-10-CM | POA: Insufficient documentation

## 2022-12-03 ENCOUNTER — Ambulatory Visit: Payer: Medicare Other | Attending: Cardiology | Admitting: Cardiology

## 2022-12-03 ENCOUNTER — Encounter: Payer: Self-pay | Admitting: Cardiology

## 2022-12-03 VITALS — BP 116/60 | HR 75 | Ht 69.0 in | Wt 178.0 lb

## 2022-12-03 DIAGNOSIS — I2510 Atherosclerotic heart disease of native coronary artery without angina pectoris: Secondary | ICD-10-CM | POA: Diagnosis not present

## 2022-12-03 DIAGNOSIS — I5032 Chronic diastolic (congestive) heart failure: Secondary | ICD-10-CM | POA: Insufficient documentation

## 2022-12-03 DIAGNOSIS — I4891 Unspecified atrial fibrillation: Secondary | ICD-10-CM | POA: Diagnosis not present

## 2022-12-03 NOTE — Progress Notes (Signed)
Clinical Summary [name] is a 77 y.o.male seen today for follow up of the following medical problems.    1. Aortic stenosis - history of bicuspid AV - history of AVR 10/22/2016,  [facility] Ease Pericardial Tissue Valve (size 33m, model # 3300TFX, serial # [id number] along with 3 vessel CABG, MV repair, and repair of ascending aortic aneurysm     - OR on 10/22/16 for CABG X 3, AVR, MVR, repair of thoracic ascending aneurysm with placement of VAC on open chest Post op course significant for cardiogenic shock requiring multiple pressors, SAM requiring removal of mitral annuloplasty ring,  persistent fevers, encephalopathy, shocked liver, DIC due to consumptive intraoperative coagulopathy, diffuse clotting with ischemia of distal BUE/BLE extremities, A fib, C diff colitis, volume overload and VDRF with difficulty with vent wean requiring tracheostomy.  Sternal wound closed on 12/4 and was started on coumadin for RV thrombus and A fib.     [facility], pneumonitis, intermittent fevers, dysphagia requiring PEG tube placement on 1/15 by Dr. YKathlene Cote [name] developed progressive gangrenous changes of BUE and BLE requiring B-transtibial amputation , left transmetacarpal amputation and Right MCP amputation of right hand by Dr. DSharol [name] 1/19.    01/2017 echo: 55-60%, no WMAs, grade II diastolic dysfunction. Normal functioning AV.   07/2019 echo: LVEF 55-60%, mild RV dysfunction, mild to mod MR   Jan 2023 echo: LVE 549-70% indet diastolic fxn, mod RV dysfucntion, mod PASP, severe BAE, mild to mod MR, mod MS. Normal AVR. Dilated fixed IVC      - no chest pains, no SOb/DOE     2. SOB/Acute on chronic diastolic HF  Jan 22637echo: LVE 585-88% indet diastolic fxn, mod RV dysfucntion, mod PASP, severe BAE, mild to mod MR, mod MS. Normal AVR. Dilated fixed IVC - Dec 15 2021: Cr 1.23 BUN 25 BNP 2994     - weight today 185 lbs, 01/2022 was 221 lbs - edema has  resolved - taking torsemide '80mg'$  bid, metolazone 2.'5mg'$  Wed, Sat - reports recent labs with pcp  - no recent SOB/DOE, no recent edema - no lightheadness, dizziness         2. Mitral regurgitation - patient had MV ring placed during his surgery 10/22/16 along with AVR, ascending thoracic aneurysm repair, and CABG. - ring was subsequently [facility] during [facility] operation [facility] 550-27% indet diastolic fxn, mod RV dysfucntion, mod PASP, severe BAE, mild to mod MR, mod MS. Normal AVR. Dilated fixed IVC       3. CAD - history of CABG 10/22/2016 (LIMA-Diag and distal LAD, SVG-LAD) 01/2017 echo: 55-60%, no WMAs, grade II diastolic dysfunction. Normal functioning AV.     - no recent chest pain   4. Ischemic limbs - occurred in setting of cardiogenic shock requiring high dose pressors as well as DIC  - required amputation - followed by ortho. - has been on coumadin, ASA   5.Afib/aflutter - initally had post op afib - noted to be in aflutter at clinical f/u and thus was committed to long term anticoagulation  08/2019 monitor no significant arrhytmias  - no bleeding on coumadin     - 07/2019 ekg afib - 11/27/20 EKG SR - 1 2023 EKG afib   - no recent palpitaitons - no bleeding on coumadin - no palpitations    6. Hyperlipidemia - 02/2021 TC 99 TG 227 HDL 30 LDL 33 - [name] is  on atorvastatin - recent labs with pcp 10/2022 LDL 39 Past Medical History:  Diagnosis Date   Arthritis    "hips, shoulders; knees; back" (10/20/2016)   Bicuspid aortic valve    BPH (benign prostatic hypertrophy)    Carpal tunnel syndrome of right wrist    Coronary artery disease involving native coronary artery of native heart with unstable angina pectoris ([place]) 10/20/2016   Diverticulitis    GERD (gastroesophageal reflux disease)    Gout    Heart murmur    Hyperlipemia    Hypertension    Postoperative atrial fibrillation ([place]) 10/24/2016   RLS (restless legs syndrome)     S/P aortic valve replacement with bioprosthetic valve 10/22/2016   25 mm [facility] Ease bovine pericardial bioprosthetic tissue valve   S/P ascending aortic aneurysm repair 10/22/2016   28 mm supracoronary straight graft replacement of ascending thoracic aortic aneurysm   S/P CABG x 3 10/22/2016   Sequential LIMA to Diag and LAD, SVG to distal LAD, open vein harvest right thigh   S/P mitral valve repair 10/22/2016   Artificial Gore-tex neochord placement x6 - posterior annuloplasty band placed but removed due to systolic anterior motion of mitral valve   Severe aortic stenosis    Snores    Never been tested for sleep apnea   Thoracic ascending aortic aneurysm ([place]) 10/21/2016   Thrombocytopenia ([place]) 10/22/2016   Type II diabetes mellitus (HCC)      Allergies  Allergen Reactions   Sulfa Antibiotics Hives     Current Outpatient Medications  Medication Sig Dispense Refill   allopurinol (ZYLOPRIM) 300 MG tablet Take 300 mg by mouth daily.     aspirin EC 81 MG tablet Take 81 mg by mouth daily.     atorvastatin (LIPITOR) 40 MG tablet TAKE 1 TABLET DAILY AT 6PM 90 tablet 3   KLOR-CON M20 20 MEQ tablet TAKE 1 TABLET TWICE A DAY (Patient taking differently: 3 (three) times daily.) 180 tablet 3   Lactobacillus (PROBIOTIC ACIDOPHILUS PO) Take 1 capsule by mouth daily.     lisinopril (ZESTRIL) 2.5 MG tablet TAKE 1 TABLET DAILY 90 tablet 3   Magnesium Oxide (MAG-OX 400 PO) Take 400 mg by mouth 2 (two) times daily.     Melatonin 10 MG CAPS Take 10 mg by mouth at bedtime.      metFORMIN (GLUCOPHAGE) 500 MG tablet Take 500 mg by mouth.     metolazone (ZAROXOLYN) 2.5 MG tablet Take one tab by mouth on Wednesday only & then as needed on Saturday's for weight gain or swelling     metoprolol succinate (TOPROL-XL) 100 MG 24 hr tablet Take 1 tablet (100 mg total) by mouth 2 (two) times daily. Take with or immediately following a meal. 180 tablet 3   MISC NATURAL PRODUCTS PO Take 1 tablet by  mouth 2 (two) times daily. Sucontral D - natural product for healthy blood sugar levels     nitroGLYCERIN (NITROSTAT) 0.4 MG SL tablet Place 1 tablet (0.4 mg total) under the tongue every 5 (five) minutes as needed. 25 tablet 3   rOPINIRole (REQUIP) 2 MG tablet Take 2 mg by mouth See admin instructions. Takes '2mg'$  at 4:30pm and '2mg'$  at 9:30pm.     Semaglutide (OZEMPIC, 0.25 OR 0.5 MG/DOSE, [place]) Inject 0.5 Syringes into the skin once a week.     tamsulosin (FLOMAX) 0.4 MG CAPS capsule Take 0.4 mg by mouth daily.     torsemide (DEMADEX) 20 MG tablet Take 4 tablets (  80 mg total) by mouth 2 (two) times daily. 360 tablet 3   warfarin (COUMADIN) 2 MG tablet TAKE 2 TABLETS BY MOUTH DAILY OR AS DIRECTED 180 tablet 2   No current facility-administered medications for this visit.     Past Surgical History:  Procedure Laterality Date   AMPUTATION Bilateral 12/11/2016   Procedure: AMPUTATION BELOW KNEE BILATERALLY;  Surgeon: [name], MD;  Location: [place];  Service: Orthopedics;  Laterality: Bilateral;   AMPUTATION Bilateral 12/11/2016   Procedure: AMPUTATION BILATERAL HANDS EXCEPT RIGHT THUMB;  Surgeon: [name], MD;  Location: [place];  Service: Orthopedics;  Laterality: Bilateral;   AORTIC VALVE REPLACEMENT N/A 10/22/2016   Procedure: AORTIC VALVE REPLACEMENT (AVR) WITH SIZE 25 MM MAGNA EASE PERICARDIAL BIOPROSTHESIS - AORTIC;  Surgeon: Rexene Alberts, MD;  Location: [place];  Service: Open Heart Surgery;  Laterality: N/A;   BONE EXCISION Right 02/01/2017   Procedure: EXCISION RIGHT INDEX METACARPAL HEAD;  Surgeon: [name], MD;  Location: [place];  Service: Orthopedics;  Laterality: Right;   CARDIAC CATHETERIZATION N/A 10/20/2016   Procedure: Right/Left Heart Cath and Coronary Angiography;  Surgeon: Leonie Man, MD;  Location: [place] [facility];  Service: Cardiovascular;  Laterality: N/A;   CARPAL TUNNEL RELEASE Right 11/28/2013   Procedure: RIGHT WRIST CARPAL TUNNEL RELEASE;  Surgeon: Lorn Junes, MD;  Location: [place];  Service: Orthopedics;  Laterality: Right;   CATARACT EXTRACTION W/ INTRAOCULAR LENS  IMPLANT, BILATERAL Bilateral 1978   COLONOSCOPY     CORONARY ARTERY BYPASS GRAFT N/A 10/22/2016   Procedure: CORONARY ARTERY BYPASS GRAFTING (CABG)x 2 WITH LIMA TO DIAGONAL, OPEN  HARVESTING OF RIGHT SAPHENOUS VEIN FOR VEIN GRAFT TO LAD;  Surgeon: Rexene Alberts, MD;  Location: [place];  Service: Open Heart Surgery;  Laterality: N/A;   FRACTURE SURGERY     INGUINAL HERNIA REPAIR Right 1998   IR GENERIC HISTORICAL  12/07/2016   IR US GUIDE VASC ACCESS RIGHT 12/07/2016 Aletta Edouard, MD [facility]   IR GENERIC HISTORICAL  12/07/2016   IR RADIOLOGY PERIPHERAL GUIDED IV START 12/07/2016 Aletta Edouard, MD [facility]   IR GENERIC HISTORICAL  12/07/2016   IR GASTROSTOMY TUBE MOD SED 12/07/2016 Aletta Edouard, MD [facility]   LIPOMA EXCISION Right 2008   "side of my head"   MITRAL VALVE REPAIR N/A 10/22/2016   Procedure: MITRAL VALVE REPAIR (MVR) WITH SIZE 30 SORIN ANNULOFLEX ANNULOPLASTY RING WITH SUBSEQUENT REMOVAL OF RING;  Surgeon: Rexene Alberts, MD;  Location: [place];  Service: Open Heart Surgery;  Laterality: N/A;   PENECTOMY  2007   Peyronie's disease    PENILE PROSTHESIS IMPLANT  2009   REMOVAL OF PENILE PROSTHESIS N/A 02/01/2017   Procedure: REMOVAL OF PENILE PROSTHESIS;  Surgeon: Kathie Rhodes, MD;  Location: [place];  Service: Urology;  Laterality: N/A;   SHOULDER OPEN ROTATOR CUFF REPAIR Right 2006   STERNAL CLOSURE N/A 10/26/2016   Procedure: STERNAL WASHOUT AND DELAYED PRIMARY CLOSURE;  Surgeon: Rexene Alberts, MD;  Location: [place];  Service: Thoracic;  Laterality: N/A;   TEE WITHOUT CARDIOVERSION N/A 10/26/2016   Procedure: TRANSESOPHAGEAL ECHOCARDIOGRAM (TEE);  Surgeon: Rexene Alberts, MD;  Location: [place];  Service: Thoracic;  Laterality: N/A;   TEE WITHOUT CARDIOVERSION N/A 10/22/2016   Procedure: TRANSESOPHAGEAL ECHOCARDIOGRAM (TEE);   Surgeon: Rexene Alberts, MD;  Location: [place];  Service: Open Heart Surgery;  Laterality: N/A;   THORACIC AORTIC ANEURYSM  REPAIR  10/22/2016   Procedure: ASCENDING AORTIC  ANEURYSM REPAIR (AAA) WITH 28 MM HEMASHIELD PLATINUM WOVEN DOUBLE VELOUR VASCULAR GRAFT;  Surgeon: Rexene Alberts, MD;  Location: [place];  Service: Open Heart Surgery;;   TONSILLECTOMY  ~ [place] N/A 11/09/2016   Procedure: TRACHEOSTOMY;  Surgeon: Rexene Alberts, MD;  Location: [place];  Service: Thoracic;  Laterality: N/A;   TRANSURETHRAL RESECTION OF PROSTATE  2005   TRIGGER FINGER RELEASE Bilateral    several lt and rt hands   TRIGGER FINGER RELEASE Right 11/28/2013   Procedure: RIGHT TRIGGER FINGER  RELEASE (TENDON SHEATH INCISION);  Surgeon: Lorn Junes, MD;  Location: [place];  Service: Orthopedics;  Laterality: Right;   VIDEO BRONCHOSCOPY N/A 11/09/2016   Procedure: VIDEO BRONCHOSCOPY;  Surgeon: Rexene Alberts, MD;  [facility];  Service: Thoracic;  Laterality: N/A;   WRIST FRACTURE SURGERY Right ~ 1959     Allergies  Allergen Reactions   Sulfa Antibiotics Hives      Family History  Problem Relation Age of Onset   Lung cancer Mother    Clotting disorder Father        No details   Heart disease Sister 41       Stents   Cancer Sister        Throat     Social History Mr. Reister reports that [name] quit smoking about 40 years ago. His smoking use included pipe and cigars. [name] has never used smokeless tobacco. Mr. Voght reports current alcohol use of about 10.0 standard drinks of alcohol per week.   Review of Systems CONSTITUTIONAL: No weight loss, fever, chills, weakness or fatigue.  HEENT: Eyes: No visual loss, blurred vision, double vision or yellow sclerae.No hearing loss, sneezing, congestion, runny nose or sore throat.  SKIN: No rash or itching.  CARDIOVASCULAR: per hpi RESPIRATORY: No shortness of breath, cough or sputum.  GASTROINTESTINAL: No  anorexia, nausea, vomiting or diarrhea. No abdominal pain or blood.  GENITOURINARY: No burning on urination, no polyuria NEUROLOGICAL: No headache, dizziness, syncope, paralysis, ataxia, numbness or tingling in the extremities. No change in bowel or bladder control.  MUSCULOSKELETAL: No muscle, back pain, joint pain or stiffness.  LYMPHATICS: No enlarged nodes. No history of splenectomy.  PSYCHIATRIC: No history of depression or anxiety.  ENDOCRINOLOGIC: No reports of sweating, cold or heat intolerance. No polyuria or polydipsia.  Marland Kitchen   Physical Examination Today's Vitals   12/03/22 1540  BP: 116/60  Pulse: 75  SpO2: 97%  Weight: 178 lb (80.7 kg)  Height: '5\' 9"'$  (1.753 m)   Body mass index is 26.29 kg/m.  Gen: resting comfortably, no acute distress HEENT: no scleral icterus, pupils equal round and reactive, no palptable cervical adenopathy,  CV: irreg, 2/6 systolic murmur apex, no jvd Resp: Clear to auscultation bilaterally GI: abdomen is soft, non-tender, non-distended, normal bowel sounds, no hepatosplenomegaly MSK: extremities are warm, no edema.  Skin: warm, no rash Neuro:  no focal deficits Psych: appropriate affect   Diagnostic Studies  07/2019 echo 1. The left ventricle has normal systolic function, with an ejection fraction of 55-60%. The cavity size was normal. There is moderately increased left ventricular wall thickness. Left ventricular diastolic Doppler parameters are indeterminate.  2. The right ventricle has mildly reduced systolic function. The cavity was moderately enlarged. There is not assessed.  3. The ventricular septum flattens in systole consistent with RV pressure overload.  4. Left atrial size was  severely dilated.  5. Right atrial size was mildly dilated.  6. Mitral valve regurgitation mild to moderate MR, the jet is eccentric and may be underestimated. No evidence of mitral valve stenosis.  7. The aortic valve was not well visualized. No stenosis of  the aortic valve.  8. Edwards Magna Ease Pericardial Tissue Valve (size 18m, model # 3300TFX is in the AV position.  9. The aorta is normal unless otherwise noted. 10. The aortic root is normal in size and structure. 11. Pulmonary hypertension is indeterminate, inadequate TR jet. 12. The inferior vena cava was dilated in size with >50% respiratory variability.     Jan 2023 echo 1. Left ventricular ejection fraction, by estimation, is 55 to 60%. The  left ventricle has normal function. Left ventricular endocardial border  not optimally defined to evaluate regional wall motion. Left ventricular  diastolic function could not be  evaluated.   2. Right ventricular systolic function is moderately reduced. The right  ventricular size is moderately enlarged. There is moderately elevated  pulmonary artery systolic pressure. The estimated right ventricular  systolic pressure is 543.1mmHg.   3. Left atrial size was severely dilated.   4. Right atrial size was severely dilated.   5. The mitral valve is degenerative. Mild to moderate mitral valve  regurgitation. Mild mitral stenosis. The mean mitral valve gradient is 7.0  mmHg. Moderate mitral annular calcification.   6. The aortic valve has been repaired/replaced. There is a 25 mm Edwards  Magna Pericardial Tissue valve present in the aortic position.      Aortic valve regurgitation is not visualized. No aortic stenosis is  present. Aortic valve area, by VTI measures 2.42 cm. Aortic valve mean  gradient measures 5.0 mmHg. Aortic valve Vmax measures 1.64 m/s.   7. Aortic root/ascending aorta has been repaired/replaced with normal  dimensions.   8. The inferior vena cava is dilated in size with <50% respiratory  variability, suggesting right atrial pressure of 15 mmHg.   9. There is a trivial pericardial effusion that is circumferential.      Assessment and Plan   1.Chronic diastolic - euvolemic today, continue current meds. Has required  high dose diuretics, renal function remains stable. Continue current regimen   2. CAD - no symptoms, continue current meds  3. Afib/acquired thrombophilia - no symptoms, conitnue current meds - EKG today shows rate controlled afib    JArnoldo Lenis M.D.

## 2022-12-03 NOTE — Patient Instructions (Signed)
Medication Instructions:  Continue all current medications.   Labwork: none  Testing/Procedures: none  Follow-Up: 6 months   Any Other Special Instructions Will Be Listed Below (If Applicable).   If you need a refill on your cardiac medications before your next appointment, please call your pharmacy.  

## 2022-12-15 ENCOUNTER — Ambulatory Visit: Payer: Medicare Other | Attending: Cardiology | Admitting: *Deleted

## 2022-12-15 DIAGNOSIS — I82603 Acute embolism and thrombosis of unspecified veins of upper extremity, bilateral: Secondary | ICD-10-CM | POA: Diagnosis not present

## 2022-12-15 DIAGNOSIS — I9789 Other postprocedural complications and disorders of the circulatory system, not elsewhere classified: Secondary | ICD-10-CM | POA: Diagnosis not present

## 2022-12-15 DIAGNOSIS — Z9889 Other specified postprocedural states: Secondary | ICD-10-CM

## 2022-12-15 DIAGNOSIS — I4891 Unspecified atrial fibrillation: Secondary | ICD-10-CM | POA: Diagnosis not present

## 2022-12-15 DIAGNOSIS — Q231 Congenital insufficiency of aortic valve: Secondary | ICD-10-CM

## 2022-12-15 DIAGNOSIS — Q2381 Bicuspid aortic valve: Secondary | ICD-10-CM

## 2022-12-15 LAB — POCT INR: INR: 3.3 — AB (ref 2.0–3.0)

## 2022-12-15 NOTE — Patient Instructions (Signed)
Hold warfarin tonight then resume 2 tablets daily except 1 tablet on Tuesdays, Thursdays and Saturdays  Continue Vit K foods Recheck 6 wks

## 2023-01-19 ENCOUNTER — Other Ambulatory Visit: Payer: Self-pay | Admitting: Cardiology

## 2023-02-01 ENCOUNTER — Telehealth: Payer: Self-pay | Admitting: Cardiology

## 2023-02-01 ENCOUNTER — Other Ambulatory Visit: Payer: Self-pay

## 2023-02-01 MED ORDER — TORSEMIDE 20 MG PO TABS: 80.0000 mg | ORAL_TABLET | Freq: Two times a day (BID) | ORAL | 1 refills | Status: DC

## 2023-02-01 NOTE — Telephone Encounter (Signed)
*  STAT* If patient is at the pharmacy, call can be transferred to refill team.   1. Which medications need to be refilled? (please list name of each medication and dose if known)   torsemide (DEMADEX) 20 MG tablet   2. Which pharmacy/location (including street and city if local pharmacy) is medication to be sent to?  [id number] [place] [place] [address]   3. Do they need a 30 day or 90 day supply?   90 day  Wife states patient has enough medication to last through Wed (3/13).

## 2023-02-08 ENCOUNTER — Telehealth: Payer: Self-pay | Admitting: Cardiology

## 2023-02-08 ENCOUNTER — Encounter: Payer: Self-pay | Admitting: Cardiology

## 2023-02-08 ENCOUNTER — Ambulatory Visit: Payer: Medicare Other | Attending: Cardiology | Admitting: *Deleted

## 2023-02-08 DIAGNOSIS — Z9889 Other specified postprocedural states: Secondary | ICD-10-CM | POA: Insufficient documentation

## 2023-02-08 DIAGNOSIS — I4891 Unspecified atrial fibrillation: Secondary | ICD-10-CM | POA: Diagnosis not present

## 2023-02-08 DIAGNOSIS — R0602 Shortness of breath: Secondary | ICD-10-CM | POA: Diagnosis not present

## 2023-02-08 DIAGNOSIS — Q231 Congenital insufficiency of aortic valve: Secondary | ICD-10-CM | POA: Diagnosis not present

## 2023-02-08 DIAGNOSIS — I9789 Other postprocedural complications and disorders of the circulatory system, not elsewhere classified: Secondary | ICD-10-CM | POA: Insufficient documentation

## 2023-02-08 DIAGNOSIS — I82603 Acute embolism and thrombosis of unspecified veins of upper extremity, bilateral: Secondary | ICD-10-CM

## 2023-02-08 LAB — PROTIME-INR: INR: 8.87 — AB (ref 0.80–1.20)

## 2023-02-08 NOTE — Telephone Encounter (Signed)
[facility] lab:   PT 92.4   INR 8.87 Plan discussed with pt's wife and she verbalized understanding.  See coumadin note for details.

## 2023-02-08 NOTE — Telephone Encounter (Signed)
Caller is reporting out of range results

## 2023-02-08 NOTE — Patient Instructions (Signed)
Been taking Ibuprofen for back back pain.  No other changes. Hold warfarin x 3 days and recheck INR on Thursday Pt denies any S/S of bleeding or excessive bruising.  Bleeding and fall precautions discussed with patient and wife.  They verbalized understanding.

## 2023-02-10 ENCOUNTER — Telehealth: Payer: Self-pay | Admitting: Cardiology

## 2023-02-10 NOTE — Telephone Encounter (Signed)
Advised that cardiology don't prescribe or refill tamsulosin and she needed to contact either PCP or urologist. Verbalized understanding.

## 2023-02-10 NOTE — Telephone Encounter (Signed)
*  STAT* If patient is at the pharmacy, call can be transferred to refill team.   1. Which medications need to be refilled? (please list name of each medication and dose if known) tamsulosin (FLOMAX) 0.4 MG CAPS capsule   2. Which pharmacy/location (including street and city if local pharmacy) is medication to be sent to? tamsulosin (FLOMAX) 0.4 MG CAPS capsule   3. Do they need a 30 day or 90 day supply? [place]

## 2023-02-11 ENCOUNTER — Ambulatory Visit: Payer: Medicare Other | Attending: Cardiology

## 2023-02-11 DIAGNOSIS — I82603 Acute embolism and thrombosis of unspecified veins of upper extremity, bilateral: Secondary | ICD-10-CM | POA: Diagnosis not present

## 2023-02-11 DIAGNOSIS — Z9889 Other specified postprocedural states: Secondary | ICD-10-CM | POA: Diagnosis not present

## 2023-02-11 DIAGNOSIS — R030 Elevated blood-pressure reading, without diagnosis of hypertension: Secondary | ICD-10-CM | POA: Diagnosis not present

## 2023-02-11 DIAGNOSIS — I9789 Other postprocedural complications and disorders of the circulatory system, not elsewhere classified: Secondary | ICD-10-CM | POA: Diagnosis not present

## 2023-02-11 DIAGNOSIS — Q231 Congenital insufficiency of aortic valve: Secondary | ICD-10-CM | POA: Diagnosis not present

## 2023-02-11 DIAGNOSIS — R197 Diarrhea, unspecified: Secondary | ICD-10-CM | POA: Diagnosis not present

## 2023-02-11 DIAGNOSIS — M545 Low back pain, unspecified: Secondary | ICD-10-CM | POA: Diagnosis not present

## 2023-02-11 DIAGNOSIS — I4891 Unspecified atrial fibrillation: Secondary | ICD-10-CM | POA: Diagnosis not present

## 2023-02-11 DIAGNOSIS — Q2381 Bicuspid aortic valve: Secondary | ICD-10-CM

## 2023-02-11 LAB — POCT INR: INR: 2.7 (ref 2.0–3.0)

## 2023-02-11 NOTE — Patient Instructions (Signed)
Description   Start taking 1 tablet daily except 2 tablets on Mondays, Wednesdays and Fridays. Recheck in 1 week.

## 2023-02-15 DIAGNOSIS — S32020A Wedge compression fracture of second lumbar vertebra, initial encounter for closed fracture: Secondary | ICD-10-CM | POA: Diagnosis not present

## 2023-02-15 DIAGNOSIS — R197 Diarrhea, unspecified: Secondary | ICD-10-CM | POA: Diagnosis not present

## 2023-02-15 DIAGNOSIS — R030 Elevated blood-pressure reading, without diagnosis of hypertension: Secondary | ICD-10-CM | POA: Diagnosis not present

## 2023-02-15 DIAGNOSIS — M545 Low back pain, unspecified: Secondary | ICD-10-CM | POA: Diagnosis not present

## 2023-02-18 ENCOUNTER — Ambulatory Visit: Payer: Medicare Other | Attending: Cardiology | Admitting: *Deleted

## 2023-02-18 ENCOUNTER — Telehealth: Payer: Self-pay | Admitting: Cardiology

## 2023-02-18 DIAGNOSIS — Q231 Congenital insufficiency of aortic valve: Secondary | ICD-10-CM | POA: Diagnosis not present

## 2023-02-18 DIAGNOSIS — Z9889 Other specified postprocedural states: Secondary | ICD-10-CM | POA: Diagnosis not present

## 2023-02-18 DIAGNOSIS — I4891 Unspecified atrial fibrillation: Secondary | ICD-10-CM | POA: Diagnosis not present

## 2023-02-18 DIAGNOSIS — I82603 Acute embolism and thrombosis of unspecified veins of upper extremity, bilateral: Secondary | ICD-10-CM

## 2023-02-18 DIAGNOSIS — I9789 Other postprocedural complications and disorders of the circulatory system, not elsewhere classified: Secondary | ICD-10-CM | POA: Diagnosis not present

## 2023-02-18 LAB — POCT INR: INR: 2.2 (ref 2.0–3.0)

## 2023-02-18 MED ORDER — NITROGLYCERIN 0.4 MG SL SUBL: 0.4000 mg | SUBLINGUAL_TABLET | SUBLINGUAL | 3 refills | Status: DC | PRN

## 2023-02-18 MED ORDER — METOLAZONE 2.5 MG PO TABS: ORAL_TABLET | ORAL | 1 refills | Status: DC

## 2023-02-18 MED ORDER — POTASSIUM CHLORIDE CRYS ER 20 MEQ PO TBCR: 20.0000 meq | EXTENDED_RELEASE_TABLET | Freq: Three times a day (TID) | ORAL | Status: DC

## 2023-02-18 MED ORDER — METFORMIN HCL 500 MG PO TABS: 500.0000 mg | ORAL_TABLET | Freq: Every morning | ORAL | Status: AC

## 2023-02-18 NOTE — Telephone Encounter (Signed)
Wife called to go over patients medications. Per DPR, discussed and updated medication record. Copy mailed to patient as requested.

## 2023-02-18 NOTE — Patient Instructions (Signed)
Continue warfarin 1 tablet daily except 2 tablets on Mondays, Wednesdays and Fridays. Recheck in 3 weeks

## 2023-02-18 NOTE — Telephone Encounter (Signed)
Mrs. Delic would like to discuss her husband's medications. She states that she feels there is some discrepancy in what Dr.Branch is ordering vs what the pharmacy is sending. Please call after 200pm

## 2023-02-22 ENCOUNTER — Other Ambulatory Visit: Payer: Self-pay | Admitting: Cardiology

## 2023-02-22 ENCOUNTER — Other Ambulatory Visit: Payer: Self-pay | Admitting: *Deleted

## 2023-02-22 MED ORDER — NITROGLYCERIN 0.4 MG SL SUBL: 0.4000 mg | SUBLINGUAL_TABLET | SUBLINGUAL | 3 refills | Status: DC | PRN

## 2023-02-26 ENCOUNTER — Other Ambulatory Visit: Payer: Self-pay | Admitting: Cardiology

## 2023-03-08 DIAGNOSIS — E782 Mixed hyperlipidemia: Secondary | ICD-10-CM | POA: Diagnosis not present

## 2023-03-08 DIAGNOSIS — E1121 Type 2 diabetes mellitus with diabetic nephropathy: Secondary | ICD-10-CM | POA: Diagnosis not present

## 2023-03-08 DIAGNOSIS — I739 Peripheral vascular disease, unspecified: Secondary | ICD-10-CM | POA: Diagnosis not present

## 2023-03-08 DIAGNOSIS — E7849 Other hyperlipidemia: Secondary | ICD-10-CM | POA: Diagnosis not present

## 2023-03-08 DIAGNOSIS — E781 Pure hyperglyceridemia: Secondary | ICD-10-CM | POA: Diagnosis not present

## 2023-03-11 ENCOUNTER — Ambulatory Visit: Payer: Medicare Other | Attending: Cardiology | Admitting: *Deleted

## 2023-03-11 DIAGNOSIS — I9789 Other postprocedural complications and disorders of the circulatory system, not elsewhere classified: Secondary | ICD-10-CM | POA: Diagnosis not present

## 2023-03-11 DIAGNOSIS — Z5181 Encounter for therapeutic drug level monitoring: Secondary | ICD-10-CM | POA: Diagnosis not present

## 2023-03-11 DIAGNOSIS — Z9889 Other specified postprocedural states: Secondary | ICD-10-CM | POA: Diagnosis not present

## 2023-03-11 DIAGNOSIS — I4891 Unspecified atrial fibrillation: Secondary | ICD-10-CM | POA: Insufficient documentation

## 2023-03-11 DIAGNOSIS — Q231 Congenital insufficiency of aortic valve: Secondary | ICD-10-CM | POA: Insufficient documentation

## 2023-03-11 DIAGNOSIS — I82603 Acute embolism and thrombosis of unspecified veins of upper extremity, bilateral: Secondary | ICD-10-CM | POA: Insufficient documentation

## 2023-03-11 LAB — POCT INR: INR: 4.9 — AB (ref 2.0–3.0)

## 2023-03-11 NOTE — Patient Instructions (Signed)
Hold warfarin tonight and tomorrow night then resume 1 tablet daily except 2 tablets on Mondays, Wednesdays and Fridays. Recheck in 10 days

## 2023-03-15 DIAGNOSIS — E7849 Other hyperlipidemia: Secondary | ICD-10-CM | POA: Diagnosis not present

## 2023-03-15 DIAGNOSIS — Z8679 Personal history of other diseases of the circulatory system: Secondary | ICD-10-CM | POA: Diagnosis not present

## 2023-03-15 DIAGNOSIS — I10 Essential (primary) hypertension: Secondary | ICD-10-CM | POA: Diagnosis not present

## 2023-03-15 DIAGNOSIS — G2581 Restless legs syndrome: Secondary | ICD-10-CM | POA: Diagnosis not present

## 2023-03-15 DIAGNOSIS — I5033 Acute on chronic diastolic (congestive) heart failure: Secondary | ICD-10-CM | POA: Diagnosis not present

## 2023-03-15 DIAGNOSIS — N401 Enlarged prostate with lower urinary tract symptoms: Secondary | ICD-10-CM | POA: Diagnosis not present

## 2023-03-15 DIAGNOSIS — E1140 Type 2 diabetes mellitus with diabetic neuropathy, unspecified: Secondary | ICD-10-CM | POA: Diagnosis not present

## 2023-03-15 DIAGNOSIS — M545 Low back pain, unspecified: Secondary | ICD-10-CM | POA: Diagnosis not present

## 2023-03-15 DIAGNOSIS — M1A9XX0 Chronic gout, unspecified, without tophus (tophi): Secondary | ICD-10-CM | POA: Diagnosis not present

## 2023-03-15 DIAGNOSIS — M810 Age-related osteoporosis without current pathological fracture: Secondary | ICD-10-CM | POA: Diagnosis not present

## 2023-03-15 DIAGNOSIS — R197 Diarrhea, unspecified: Secondary | ICD-10-CM | POA: Diagnosis not present

## 2023-03-15 DIAGNOSIS — Z6827 Body mass index (BMI) 27.0-27.9, adult: Secondary | ICD-10-CM | POA: Diagnosis not present

## 2023-03-17 DIAGNOSIS — F17210 Nicotine dependence, cigarettes, uncomplicated: Secondary | ICD-10-CM | POA: Diagnosis not present

## 2023-03-17 DIAGNOSIS — Z0001 Encounter for general adult medical examination with abnormal findings: Secondary | ICD-10-CM | POA: Diagnosis not present

## 2023-03-17 DIAGNOSIS — E1121 Type 2 diabetes mellitus with diabetic nephropathy: Secondary | ICD-10-CM | POA: Diagnosis not present

## 2023-03-17 DIAGNOSIS — I10 Essential (primary) hypertension: Secondary | ICD-10-CM | POA: Diagnosis not present

## 2023-03-17 DIAGNOSIS — E7849 Other hyperlipidemia: Secondary | ICD-10-CM | POA: Diagnosis not present

## 2023-03-23 ENCOUNTER — Ambulatory Visit: Payer: Medicare Other | Attending: Cardiology | Admitting: *Deleted

## 2023-03-23 DIAGNOSIS — Q231 Congenital insufficiency of aortic valve: Secondary | ICD-10-CM | POA: Diagnosis not present

## 2023-03-23 DIAGNOSIS — I9789 Other postprocedural complications and disorders of the circulatory system, not elsewhere classified: Secondary | ICD-10-CM

## 2023-03-23 DIAGNOSIS — Z9889 Other specified postprocedural states: Secondary | ICD-10-CM | POA: Diagnosis not present

## 2023-03-23 DIAGNOSIS — Q2381 Bicuspid aortic valve: Secondary | ICD-10-CM

## 2023-03-23 DIAGNOSIS — I4891 Unspecified atrial fibrillation: Secondary | ICD-10-CM

## 2023-03-23 DIAGNOSIS — I82603 Acute embolism and thrombosis of unspecified veins of upper extremity, bilateral: Secondary | ICD-10-CM | POA: Diagnosis not present

## 2023-03-23 LAB — POCT INR: INR: 3 (ref 2.0–3.0)

## 2023-03-23 NOTE — Patient Instructions (Signed)
Continue warfarin 1 tablet daily except 2 tablets on Mondays, Wednesdays and Fridays. Recheck in 4 wks

## 2023-04-22 DIAGNOSIS — E1165 Type 2 diabetes mellitus with hyperglycemia: Secondary | ICD-10-CM | POA: Diagnosis not present

## 2023-04-22 DIAGNOSIS — I10 Essential (primary) hypertension: Secondary | ICD-10-CM | POA: Diagnosis not present

## 2023-04-22 DIAGNOSIS — E782 Mixed hyperlipidemia: Secondary | ICD-10-CM | POA: Diagnosis not present

## 2023-04-23 ENCOUNTER — Other Ambulatory Visit: Payer: Self-pay | Admitting: Cardiology

## 2023-04-23 DIAGNOSIS — L89892 Pressure ulcer of other site, stage 2: Secondary | ICD-10-CM | POA: Diagnosis not present

## 2023-04-23 DIAGNOSIS — L89309 Pressure ulcer of unspecified buttock, unspecified stage: Secondary | ICD-10-CM | POA: Diagnosis not present

## 2023-04-23 DIAGNOSIS — R030 Elevated blood-pressure reading, without diagnosis of hypertension: Secondary | ICD-10-CM | POA: Diagnosis not present

## 2023-04-23 DIAGNOSIS — M7061 Trochanteric bursitis, right hip: Secondary | ICD-10-CM | POA: Diagnosis not present

## 2023-05-03 ENCOUNTER — Ambulatory Visit: Payer: Medicare Other | Attending: Cardiology | Admitting: *Deleted

## 2023-05-03 ENCOUNTER — Other Ambulatory Visit (HOSPITAL_COMMUNITY): Payer: Self-pay | Admitting: Family Medicine

## 2023-05-03 DIAGNOSIS — Z5181 Encounter for therapeutic drug level monitoring: Secondary | ICD-10-CM | POA: Diagnosis not present

## 2023-05-03 DIAGNOSIS — D65 Disseminated intravascular coagulation [defibrination syndrome]: Secondary | ICD-10-CM

## 2023-05-03 DIAGNOSIS — E1142 Type 2 diabetes mellitus with diabetic polyneuropathy: Secondary | ICD-10-CM | POA: Diagnosis not present

## 2023-05-03 DIAGNOSIS — M7061 Trochanteric bursitis, right hip: Secondary | ICD-10-CM | POA: Diagnosis not present

## 2023-05-03 DIAGNOSIS — I9789 Other postprocedural complications and disorders of the circulatory system, not elsewhere classified: Secondary | ICD-10-CM

## 2023-05-03 DIAGNOSIS — Z89512 Acquired absence of left leg below knee: Secondary | ICD-10-CM | POA: Diagnosis not present

## 2023-05-03 DIAGNOSIS — R030 Elevated blood-pressure reading, without diagnosis of hypertension: Secondary | ICD-10-CM | POA: Diagnosis not present

## 2023-05-03 DIAGNOSIS — N1831 Chronic kidney disease, stage 3a: Secondary | ICD-10-CM | POA: Diagnosis not present

## 2023-05-03 DIAGNOSIS — L89892 Pressure ulcer of other site, stage 2: Secondary | ICD-10-CM | POA: Diagnosis not present

## 2023-05-03 DIAGNOSIS — I4891 Unspecified atrial fibrillation: Secondary | ICD-10-CM

## 2023-05-03 DIAGNOSIS — R296 Repeated falls: Secondary | ICD-10-CM | POA: Diagnosis not present

## 2023-05-03 DIAGNOSIS — S32020A Wedge compression fracture of second lumbar vertebra, initial encounter for closed fracture: Secondary | ICD-10-CM | POA: Diagnosis not present

## 2023-05-03 LAB — POCT INR: INR: 2.5 (ref 2.0–3.0)

## 2023-05-03 NOTE — Patient Instructions (Signed)
Continue warfarin 1 tablet daily except 2 tablets on Mondays, Wednesdays and Fridays. Recheck in 4 wks 

## 2023-05-14 DIAGNOSIS — M25551 Pain in right hip: Secondary | ICD-10-CM | POA: Diagnosis not present

## 2023-05-27 ENCOUNTER — Other Ambulatory Visit: Payer: Self-pay | Admitting: Cardiology

## 2023-05-28 ENCOUNTER — Ambulatory Visit (HOSPITAL_COMMUNITY)
Admission: RE | Admit: 2023-05-28 | Discharge: 2023-05-28 | Disposition: A | Discharge status: Home / Self Care | Payer: Medicare Other | Source: Ambulatory Visit | Attending: Family Medicine | Admitting: Family Medicine

## 2023-05-28 ENCOUNTER — Telehealth: Payer: Self-pay | Admitting: Cardiology

## 2023-05-28 DIAGNOSIS — M5136 Other intervertebral disc degeneration, lumbar region: Secondary | ICD-10-CM | POA: Diagnosis not present

## 2023-05-28 DIAGNOSIS — M48061 Spinal stenosis, lumbar region without neurogenic claudication: Secondary | ICD-10-CM | POA: Diagnosis not present

## 2023-05-28 DIAGNOSIS — M47816 Spondylosis without myelopathy or radiculopathy, lumbar region: Secondary | ICD-10-CM | POA: Diagnosis not present

## 2023-05-28 DIAGNOSIS — M4856XA Collapsed vertebra, not elsewhere classified, lumbar region, initial encounter for fracture: Secondary | ICD-10-CM | POA: Diagnosis not present

## 2023-05-28 DIAGNOSIS — S32020A Wedge compression fracture of second lumbar vertebra, initial encounter for closed fracture: Secondary | ICD-10-CM | POA: Insufficient documentation

## 2023-05-28 DIAGNOSIS — M7061 Trochanteric bursitis, right hip: Secondary | ICD-10-CM | POA: Diagnosis not present

## 2023-05-28 MED ORDER — METOPROLOL SUCCINATE ER 100 MG PO TB24: ORAL_TABLET | ORAL | 0 refills | Status: DC

## 2023-05-28 NOTE — Telephone Encounter (Signed)
Pt and daughter came in office and stated that the pt's wife sent them in with a note. Pt's metoprolol succinate er tabs was changed by Dr. Wyline Mood to 2 tablets a day but the actual rx was not changed. They are stating that they need a 14 day supply [facility] in to [facility] and then a new RX with the 2 tablets reflected sent to [facility] pharmacy. The pt is a little confused saying he was taking the medication weekly not daily. [phone] is the best number to reach the pt's wife who handles all of his medication.

## 2023-05-28 NOTE — Telephone Encounter (Signed)
Per OV note:  Assessment and Plan    1.Chronic diastolic - euvolemic today, continue current meds. Has required high dose diuretics, renal function remains stable. Continue current regimen   2. CAD - no symptoms, continue current meds   3. Afib/acquired thrombophilia - no symptoms, conitnue current meds - EKG today shows rate controlled afib   Spoke to patients wife and informed that pt's medication was not changed per provider. Pts wife is unsure why she wrote down that pt's medication was changed. Pt has been taking 2 tablets daily for a few months.   Pts wife is asking for medication refill for 14 days to their local pharmacy.   Will route to provider- please advise.

## 2023-05-31 MED ORDER — METOPROLOL SUCCINATE ER 100 MG PO TB24: 100.0000 mg | ORAL_TABLET | Freq: Two times a day (BID) | ORAL | 3 refills | Status: DC

## 2023-05-31 NOTE — Telephone Encounter (Signed)
THe med list in the prior clinic notes had toprol 100mg  bid listed,  I believe that was the dose he was on and should be on.  Dominga Ferry MD

## 2023-05-31 NOTE — Telephone Encounter (Signed)
Patient notified and verbalized understanding. Pt had no further questions or concerns at this time.  ?

## 2023-06-01 ENCOUNTER — Ambulatory Visit: Payer: Medicare Other | Attending: Cardiology | Admitting: *Deleted

## 2023-06-01 DIAGNOSIS — I82603 Acute embolism and thrombosis of unspecified veins of upper extremity, bilateral: Secondary | ICD-10-CM | POA: Insufficient documentation

## 2023-06-01 DIAGNOSIS — Z9889 Other specified postprocedural states: Secondary | ICD-10-CM | POA: Insufficient documentation

## 2023-06-01 DIAGNOSIS — I4891 Unspecified atrial fibrillation: Secondary | ICD-10-CM | POA: Diagnosis not present

## 2023-06-01 DIAGNOSIS — Q231 Congenital insufficiency of aortic valve: Secondary | ICD-10-CM | POA: Insufficient documentation

## 2023-06-01 DIAGNOSIS — I9789 Other postprocedural complications and disorders of the circulatory system, not elsewhere classified: Secondary | ICD-10-CM | POA: Diagnosis not present

## 2023-06-01 LAB — POCT INR: INR: 2 (ref 2.0–3.0)

## 2023-06-01 NOTE — Patient Instructions (Signed)
Continue warfarin 1 tablet daily except 2 tablets on Mondays, Wednesdays and Fridays. Recheck in 6 wks

## 2023-06-03 MED ORDER — METOPROLOL SUCCINATE ER 100 MG PO TB24: 100.0000 mg | ORAL_TABLET | Freq: Two times a day (BID) | ORAL | 3 refills | Status: DC

## 2023-06-03 NOTE — Addendum Note (Signed)
Addended by: Leonides Schanz C on: 06/03/2023 11:43 AM   Modules accepted: Orders

## 2023-06-09 ENCOUNTER — Ambulatory Visit: Payer: Medicare Other | Attending: Cardiology | Admitting: Cardiology

## 2023-06-09 ENCOUNTER — Encounter: Payer: Self-pay | Admitting: Cardiology

## 2023-06-09 VITALS — BP 120/70 | HR 78 | Ht 69.0 in | Wt 149.8 lb

## 2023-06-09 DIAGNOSIS — I5032 Chronic diastolic (congestive) heart failure: Secondary | ICD-10-CM | POA: Insufficient documentation

## 2023-06-09 DIAGNOSIS — I2510 Atherosclerotic heart disease of native coronary artery without angina pectoris: Secondary | ICD-10-CM | POA: Diagnosis present

## 2023-06-09 MED ORDER — METOPROLOL SUCCINATE ER 100 MG PO TB24: 100.0000 mg | ORAL_TABLET | Freq: Two times a day (BID) | ORAL | 3 refills | Status: DC

## 2023-06-09 NOTE — Patient Instructions (Addendum)

## 2023-06-09 NOTE — Progress Notes (Signed)
Clinical Summary Scott [name] is a 77 y.o.male seen today for follow up of the following medical problems.    1. Aortic stenosis - history of bicuspid AV - history of AVR 10/22/2016,  [facility] Ease Pericardial Tissue Valve (size 25mm, model # 3300TFX, serial # [id number]) along with 3 vessel CABG, MV repair, and repair of ascending aortic aneurysm     - OR on 10/22/16 for CABG X 3, AVR, MVR, repair of thoracic ascending aneurysm with placement of VAC on open chest Post op course significant for cardiogenic shock requiring multiple pressors, SAM requiring removal of mitral annuloplasty ring,  persistent fevers, encephalopathy, shocked liver, DIC due to consumptive intraoperative coagulopathy, diffuse clotting with ischemia of distal BUE/BLE extremities, A fib, C diff colitis, volume overload and VDRF with difficulty with vent wean requiring tracheostomy.  Sternal wound closed on 12/4 and was started on coumadin for RV thrombus and A fib.     [facility] course significant for MRSA bacteremia, pneumonitis, intermittent fevers, dysphagia requiring PEG tube placement on 1/15 by Dr. Fredia Sorrow. He developed progressive gangrenous changes of BUE and BLE requiring B-transtibial amputation , left transmetacarpal amputation and Right MCP amputation of right hand by Dr. Lajoyce Corners on 1/19.    01/2017 echo: 55-60%, no WMAs, grade II diastolic dysfunction. Normal functioning AV.   07/2019 echo: LVEF 55-60%, mild RV dysfunction, mild to mod MR   Jan 2023 echo: LVE 55-60%, indet diastolic fxn, mod RV dysfucntion, mod PASP, severe BAE, mild to mod MR, mod MS. Normal AVR. Dilated fixed IVC    - no SOB/DOE - takes toresmide 80mg  bid, metolazone 2.5mg  Wed. Can take additional 2.5mg  on Saturdays.      2. Chronic diastolic HF  Jan 2023 echo: LVE 55-60%, indet diastolic fxn, mod RV dysfucntion, mod PASP, severe BAE, mild to mod MR, mod MS. Normal AVR. Dilated fixed IVC - Dec 15 2021: Cr 1.23 BUN 25 BNP 2994      - weight today 185 lbs, 01/2022 was 221 lbs - edema has resolved - taking torsemide 80mg  bid, metolazone 2.5mg  Wed, Sat - reports recent labs with pcp   - no recent SOB/DOE, no recent edema - no lightheadedness or dizziness.          2. Mitral regurgitation - patient had MV ring placed during his surgery 10/22/16 along with AVR, ascending thoracic aneurysm repair, and CABG. - ring was subsequently removed during that operation due to significant [facility] Jan 2023 echo: LVE 55-60%, indet diastolic fxn, mod RV dysfucntion, mod PASP, severe BAE, mild to mod MR, mod MS. Normal AVR. Dilated fixed IVC       3. CAD - history of CABG 10/22/2016 (LIMA-Diag and distal LAD, SVG-LAD) 01/2017 echo: 55-60%, no WMAs, grade II diastolic dysfunction. Normal functioning AV.     - no recent chest pains   4. Ischemic limbs - occurred in setting of cardiogenic shock requiring high dose pressors as well as DIC  - required amputation - followed by ortho. - has been on coumadin, ASA   5.Afib/aflutter - initally had post op afib - noted to be in aflutter at clinical f/u and thus was committed to long term anticoagulation  08/2019 monitor no significant arrhytmias   - no bleeding on coumadin     - 07/2019 ekg afib - 11/27/20 EKG SR - 1 2023 EKG afib   - rare palpitations, short in duratino - no bleeding on coumadin.     6. Hyperlipidemia -  02/2021 TC 99 TG 227 HDL 30 LDL 33 - he is on atorvastatin - recent labs with pcp 10/2022 LDL 39 Past Medical History:  Diagnosis Date   Arthritis    "hips, shoulders; knees; back" (10/20/2016)   Bicuspid aortic valve    BPH (benign prostatic hypertrophy)    Carpal tunnel syndrome of right wrist    Coronary artery disease involving native coronary artery of native heart with unstable angina pectoris (HCC) 10/20/2016   Diverticulitis    GERD (gastroesophageal reflux disease)    Gout    Heart murmur    Hyperlipemia    Hypertension    Postoperative  atrial fibrillation (HCC) 10/24/2016   RLS (restless legs syndrome)    S/P aortic valve replacement with bioprosthetic valve 10/22/2016   25 mm [facility] Ease bovine pericardial bioprosthetic tissue valve   S/P ascending aortic aneurysm repair 10/22/2016   28 mm supracoronary straight graft replacement of ascending thoracic aortic aneurysm   S/P CABG x 3 10/22/2016   Sequential LIMA to Diag and LAD, SVG to distal LAD, open vein harvest right thigh   S/P mitral valve repair 10/22/2016   Artificial Gore-tex neochord placement x6 - posterior annuloplasty band placed but removed due to systolic anterior motion of mitral valve   Severe aortic stenosis    Snores    Never been tested for sleep apnea   Thoracic ascending aortic aneurysm (HCC) 10/21/2016   Thrombocytopenia (HCC) 10/22/2016   Type II diabetes mellitus (HCC)      Allergies  Allergen Reactions   Sulfa Antibiotics Hives     Current Outpatient Medications  Medication Sig Dispense Refill   allopurinol (ZYLOPRIM) 300 MG tablet Take 300 mg by mouth daily.     aspirin EC 81 MG tablet Take 81 mg by mouth daily.     atorvastatin (LIPITOR) 40 MG tablet TAKE 1 TABLET DAILY AT 6PM 90 tablet 1   KLOR-CON M20 20 MEQ tablet TAKE 1 TABLET TWICE A DAY 180 tablet 3   Lactobacillus (PROBIOTIC ACIDOPHILUS PO) Take 1 capsule by mouth daily.     lisinopril (ZESTRIL) 2.5 MG tablet TAKE 1 TABLET DAILY 90 tablet 1   Magnesium Oxide (MAG-OX 400 PO) Take 400 mg by mouth 2 (two) times daily.     Melatonin 10 MG CAPS Take 10 mg by mouth at bedtime.      metFORMIN (GLUCOPHAGE) 500 MG tablet Take 1 tablet (500 mg total) by mouth in the morning. Take 2 tablets (1000 mg) every evening.     metolazone (ZAROXOLYN) 2.5 MG tablet TAKE 1 TABLET TWICE WEEKLY ON WEDNESDAY AND SATURDAY, CHANGE IN DIRECTIONS 24 tablet 1   metoprolol succinate (TOPROL-XL) 100 MG 24 hr tablet Take 1 tablet (100 mg total) by mouth 2 (two) times daily. 180 tablet 3   MISC NATURAL  PRODUCTS PO Take 1 tablet by mouth 2 (two) times daily. Sucontral D - natural product for healthy blood sugar levels     nitroGLYCERIN (NITROSTAT) 0.4 MG SL tablet Place 1 tablet (0.4 mg total) under the tongue every 5 (five) minutes x 3 doses as needed (If no relief after 3rd dose, go to ED.). 25 tablet 3   rOPINIRole (REQUIP) 2 MG tablet Take 2 mg by mouth See admin instructions. Takes 2mg  at 4:30pm and 2mg  at 9:30pm.     tamsulosin (FLOMAX) 0.4 MG CAPS capsule Take 0.4 mg by mouth daily.     torsemide (DEMADEX) 20 MG tablet TAKE 4 TABLETS (80 MG TOTAL)  BY MOUTH 2 (TWO) TIMES DAILY. 720 tablet 1   warfarin (COUMADIN) 2 MG tablet TAKE 2 TABLETS BY MOUTH DAILY OR AS DIRECTED 180 tablet 2   No current facility-administered medications for this visit.     Past Surgical History:  Procedure Laterality Date   AMPUTATION Bilateral 12/11/2016   Procedure: AMPUTATION BELOW KNEE BILATERALLY;  Surgeon: Nadara Mustard, MD;  Location: [facility] OR;  Service: Orthopedics;  Laterality: Bilateral;   AMPUTATION Bilateral 12/11/2016   Procedure: AMPUTATION BILATERAL HANDS EXCEPT RIGHT THUMB;  Surgeon: Nadara Mustard, MD;  Location: [facility] OR;  Service: Orthopedics;  Laterality: Bilateral;   AORTIC VALVE REPLACEMENT N/A 10/22/2016   Procedure: AORTIC VALVE REPLACEMENT (AVR) WITH SIZE 25 MM MAGNA EASE PERICARDIAL BIOPROSTHESIS - AORTIC;  Surgeon: Purcell Nails, MD;  Location: [facility] OR;  Service: Open Heart Surgery;  Laterality: N/A;   BONE EXCISION Right 02/01/2017   Procedure: EXCISION RIGHT INDEX METACARPAL HEAD;  Surgeon: Nadara Mustard, MD;  Location: [facility] OR;  Service: Orthopedics;  Laterality: Right;   CARDIAC CATHETERIZATION N/A 10/20/2016   Procedure: Right/Left Heart Cath and Coronary Angiography;  Surgeon: Marykay Lex, MD;  Location: [facility] INVASIVE CV LAB;  Service: Cardiovascular;  Laterality: N/A;   CARPAL TUNNEL RELEASE Right 11/28/2013   Procedure: RIGHT WRIST CARPAL TUNNEL RELEASE;  Surgeon: Nilda Simmer, MD;   Location: [facility];  Service: Orthopedics;  Laterality: Right;   CATARACT EXTRACTION W/ INTRAOCULAR LENS  IMPLANT, BILATERAL Bilateral 1978   COLONOSCOPY     CORONARY ARTERY BYPASS GRAFT N/A 10/22/2016   Procedure: CORONARY ARTERY BYPASS GRAFTING (CABG)x 2 WITH LIMA TO DIAGONAL, OPEN  HARVESTING OF RIGHT SAPHENOUS VEIN FOR VEIN GRAFT TO LAD;  Surgeon: Purcell Nails, MD;  Location: [facility] OR;  Service: Open Heart Surgery;  Laterality: N/A;   FRACTURE SURGERY     INGUINAL HERNIA REPAIR Right 1998   IR GENERIC HISTORICAL  12/07/2016   IR US GUIDE VASC ACCESS RIGHT 12/07/2016 Irish Lack, MD [facility]   IR GENERIC HISTORICAL  12/07/2016   IR RADIOLOGY PERIPHERAL GUIDED IV START 12/07/2016 Irish Lack, MD [facility]   IR GENERIC HISTORICAL  12/07/2016   IR GASTROSTOMY TUBE MOD SED 12/07/2016 Irish Lack, MD [facility]   LIPOMA EXCISION Right 2008   "side of my head"   MITRAL VALVE REPAIR N/A 10/22/2016   Procedure: MITRAL VALVE REPAIR (MVR) WITH SIZE 30 SORIN ANNULOFLEX ANNULOPLASTY RING WITH SUBSEQUENT REMOVAL OF RING;  Surgeon: Purcell Nails, MD;  Location: [facility];  Service: Open Heart Surgery;  Laterality: N/A;   PENECTOMY  2007   Peyronie's disease    PENILE PROSTHESIS IMPLANT  2009   REMOVAL OF PENILE PROSTHESIS N/A 02/01/2017   Procedure: REMOVAL OF PENILE PROSTHESIS;  Surgeon: Ihor Gully, MD;  Location: [facility] OR;  Service: Urology;  Laterality: N/A;   SHOULDER OPEN ROTATOR CUFF REPAIR Right 2006   STERNAL CLOSURE N/A 10/26/2016   Procedure: STERNAL WASHOUT AND DELAYED PRIMARY CLOSURE;  Surgeon: Purcell Nails, MD;  Location: [facility];  Service: Thoracic;  Laterality: N/A;   TEE WITHOUT CARDIOVERSION N/A 10/26/2016   Procedure: TRANSESOPHAGEAL ECHOCARDIOGRAM (TEE);  Surgeon: Purcell Nails, MD;  Location: [facility] OR;  Service: Thoracic;  Laterality: N/A;   TEE WITHOUT CARDIOVERSION N/A 10/22/2016   Procedure: TRANSESOPHAGEAL ECHOCARDIOGRAM (TEE);  Surgeon: Purcell Nails, MD;  Location[facility] OR;  Service: Open Heart Surgery;  Laterality: N/A;   THORACIC AORTIC ANEURYSM REPAIR  10/22/2016  Procedure: ASCENDING AORTIC  ANEURYSM REPAIR (AAA) WITH 28 MM HEMASHIELD PLATINUM WOVEN DOUBLE VELOUR VASCULAR GRAFT;  Surgeon: Purcell Nails, MD;  Location: [facility];  Service: Open Heart Surgery;;   TONSILLECTOMY  ~ 1955   TRACHEOSTOMY TUBE PLACEMENT N/A 11/09/2016   Procedure: TRACHEOSTOMY;  Surgeon: Purcell Nails, MD;  Location: [facility];  Service: Thoracic;  Laterality: N/A;   TRANSURETHRAL RESECTION OF PROSTATE  2005   TRIGGER FINGER RELEASE Bilateral    several lt and rt hands   TRIGGER FINGER RELEASE Right 11/28/2013   Procedure: RIGHT TRIGGER FINGER  RELEASE (TENDON SHEATH INCISION);  Surgeon: Nilda Simmer, MD;  Location: [facility];  Service: Orthopedics;  Laterality: Right;   VIDEO BRONCHOSCOPY N/A 11/09/2016   Procedure: VIDEO BRONCHOSCOPY;  Surgeon: Purcell Nails, MD;  Location: [facility] OR;  Service: Thoracic;  Laterality: N/A;   WRIST FRACTURE SURGERY Right ~ 1959     Allergies  Allergen Reactions   Sulfa Antibiotics Hives      Family History  Problem Relation Age of Onset   Lung cancer Mother    Clotting disorder Father        No details   Heart disease Sister 4       Stents   Cancer Sister        Throat     Social History Scott [name] reports that he quit smoking about 40 years ago. His smoking use included pipe and cigars. He has never used smokeless tobacco. Scott [name] reports current alcohol use of about 10.0 standard drinks of alcohol per week.   Review of Systems CONSTITUTIONAL: No weight loss, fever, chills, weakness or fatigue.  HEENT: Eyes: No visual loss, blurred vision, double vision or yellow sclerae.No hearing loss, sneezing, congestion, runny nose or sore throat.  SKIN: No rash or itching.  CARDIOVASCULAR: per hpi RESPIRATORY: No shortness of breath, cough or sputum.  GASTROINTESTINAL: No anorexia, nausea,  vomiting or diarrhea. No abdominal pain or blood.  GENITOURINARY: No burning on urination, no polyuria NEUROLOGICAL: No headache, dizziness, syncope, paralysis, ataxia, numbness or tingling in the extremities. No change in bowel or bladder control.  MUSCULOSKELETAL: No muscle, back pain, joint pain or stiffness.  LYMPHATICS: No enlarged nodes. No history of splenectomy.  PSYCHIATRIC: No history of depression or anxiety.  ENDOCRINOLOGIC: No reports of sweating, cold or heat intolerance. No polyuria or polydipsia.  Marland Kitchen   Physical Examination Today's Vitals   06/09/23 1259  BP: 120/70  Pulse: 78  Weight: 149 lb 12.8 oz (67.9 kg)  Height: 5\' 9"  (1.753 m)   Body mass index is 22.12 kg/m.  Gen: resting comfortably, no acute distress HEENT: no scleral icterus, pupils equal round and reactive, no palptable cervical adenopathy,  CV: RRR, 2/6 systolic murmur apex, no jvd Resp: Clear to auscultation bilaterally GI: abdomen is soft, non-tender, non-distended, normal bowel sounds, no hepatosplenomegaly MSK: extremities are warm, no edema.  Skin: warm, no rash Neuro:  no focal deficits Psych: appropriate affect   Diagnostic Studies 07/2019 echo 1. The left ventricle has normal systolic function, with an ejection fraction of 55-60%. The cavity size was normal. There is moderately increased left ventricular wall thickness. Left ventricular diastolic Doppler parameters are indeterminate.  2. The right ventricle has mildly reduced systolic function. The cavity was moderately enlarged. There is not assessed.  3. The ventricular septum flattens in systole consistent with RV pressure overload.  4. Left atrial size was severely dilated.  5. Right atrial size  was mildly dilated.  6. Mitral valve regurgitation mild to moderate MR, the jet is eccentric and may be underestimated. No evidence of mitral valve stenosis.  7. The aortic valve was not well visualized. No stenosis of the aortic valve.  8.  Edwards Magna Ease Pericardial Tissue Valve (size 25mm, model # 3300TFX is in the AV position.  9. The aorta is normal unless otherwise noted. 10. The aortic root is normal in size and structure. 11. Pulmonary hypertension is indeterminate, inadequate TR jet. 12. The inferior vena cava was dilated in size with >50% respiratory variability.     Jan 2023 echo 1. Left ventricular ejection fraction, by estimation, is 55 to 60%. The  left ventricle has normal function. Left ventricular endocardial border  not optimally defined to evaluate regional wall motion. Left ventricular  diastolic function could not be  evaluated.   2. Right ventricular systolic function is moderately reduced. The right  ventricular size is moderately enlarged. There is moderately elevated  pulmonary artery systolic pressure. The estimated right ventricular  systolic pressure is 51.7 mmHg.   3. Left atrial size was severely dilated.   4. Right atrial size was severely dilated.   5. The mitral valve is degenerative. Mild to moderate mitral valve  regurgitation. Mild mitral stenosis. The mean mitral valve gradient is 7.0  mmHg. Moderate mitral annular calcification.   6. The aortic valve has been repaired/replaced. There is a 25 mm Edwards  Magna Pericardial Tissue valve present in the aortic position.      Aortic valve regurgitation is not visualized. No aortic stenosis is  present. Aortic valve area, by VTI measures 2.42 cm. Aortic valve mean  gradient measures 5.0 mmHg. Aortic valve Vmax measures 1.64 m/s.   7. Aortic root/ascending aorta has been repaired/replaced with normal  dimensions.   8. The inferior vena cava is dilated in size with <50% respiratory  variability, suggesting right atrial pressure of 15 mmHg.   9. There is a trivial pericardial effusion that is circumferential.         Assessment and Plan  1.Chronic diastolic - no symptoms, he will continue torsemide 80mg  bid. Takes metolazone 2.5mg   on Wed, may take additional dose prn on Saturdays if needed   2. CAD - denies symptoms, continue current meds   3. Afib/acquired thrombophilia - no symptoms, continue current meds - afib with tissue valve however have favored coumadin , he does have a moderate gradient across his mitral valve and would qualify as having valvular afib - he is on toprol 100mg  bid for rate control   F/u 6 months      Antoine Poche, M.D.

## 2023-06-10 ENCOUNTER — Other Ambulatory Visit (HOSPITAL_COMMUNITY): Payer: Self-pay | Admitting: Orthopedic Surgery

## 2023-06-10 DIAGNOSIS — M25551 Pain in right hip: Secondary | ICD-10-CM

## 2023-06-18 DIAGNOSIS — M25551 Pain in right hip: Secondary | ICD-10-CM | POA: Diagnosis not present

## 2023-06-29 DIAGNOSIS — N189 Chronic kidney disease, unspecified: Secondary | ICD-10-CM | POA: Diagnosis not present

## 2023-06-29 DIAGNOSIS — Z7984 Long term (current) use of oral hypoglycemic drugs: Secondary | ICD-10-CM | POA: Insufficient documentation

## 2023-06-29 DIAGNOSIS — B009 Herpesviral infection, unspecified: Secondary | ICD-10-CM | POA: Diagnosis not present

## 2023-06-29 DIAGNOSIS — Z7901 Long term (current) use of anticoagulants: Secondary | ICD-10-CM | POA: Diagnosis not present

## 2023-06-29 DIAGNOSIS — E1122 Type 2 diabetes mellitus with diabetic chronic kidney disease: Secondary | ICD-10-CM | POA: Diagnosis not present

## 2023-06-29 DIAGNOSIS — L89892 Pressure ulcer of other site, stage 2: Secondary | ICD-10-CM | POA: Diagnosis not present

## 2023-06-29 DIAGNOSIS — I129 Hypertensive chronic kidney disease with stage 1 through stage 4 chronic kidney disease, or unspecified chronic kidney disease: Secondary | ICD-10-CM | POA: Diagnosis not present

## 2023-06-29 DIAGNOSIS — E7800 Pure hypercholesterolemia, unspecified: Secondary | ICD-10-CM | POA: Diagnosis not present

## 2023-06-29 DIAGNOSIS — G2581 Restless legs syndrome: Secondary | ICD-10-CM | POA: Diagnosis not present

## 2023-06-29 DIAGNOSIS — R011 Cardiac murmur, unspecified: Secondary | ICD-10-CM | POA: Diagnosis not present

## 2023-06-29 DIAGNOSIS — K5790 Diverticulosis of intestine, part unspecified, without perforation or abscess without bleeding: Secondary | ICD-10-CM | POA: Diagnosis not present

## 2023-07-02 DIAGNOSIS — M810 Age-related osteoporosis without current pathological fracture: Secondary | ICD-10-CM | POA: Diagnosis not present

## 2023-07-02 DIAGNOSIS — N189 Chronic kidney disease, unspecified: Secondary | ICD-10-CM | POA: Diagnosis not present

## 2023-07-02 DIAGNOSIS — N400 Enlarged prostate without lower urinary tract symptoms: Secondary | ICD-10-CM | POA: Diagnosis not present

## 2023-07-02 DIAGNOSIS — N529 Male erectile dysfunction, unspecified: Secondary | ICD-10-CM | POA: Diagnosis not present

## 2023-07-02 DIAGNOSIS — M109 Gout, unspecified: Secondary | ICD-10-CM | POA: Diagnosis not present

## 2023-07-02 DIAGNOSIS — E7800 Pure hypercholesterolemia, unspecified: Secondary | ICD-10-CM | POA: Diagnosis not present

## 2023-07-02 DIAGNOSIS — Z7901 Long term (current) use of anticoagulants: Secondary | ICD-10-CM | POA: Diagnosis not present

## 2023-07-02 DIAGNOSIS — F1090 Alcohol use, unspecified, uncomplicated: Secondary | ICD-10-CM | POA: Diagnosis not present

## 2023-07-02 DIAGNOSIS — L89892 Pressure ulcer of other site, stage 2: Secondary | ICD-10-CM | POA: Diagnosis not present

## 2023-07-02 DIAGNOSIS — Z89022 Acquired absence of left finger(s): Secondary | ICD-10-CM | POA: Diagnosis not present

## 2023-07-02 DIAGNOSIS — G2581 Restless legs syndrome: Secondary | ICD-10-CM | POA: Diagnosis not present

## 2023-07-02 DIAGNOSIS — B009 Herpesviral infection, unspecified: Secondary | ICD-10-CM | POA: Diagnosis not present

## 2023-07-02 DIAGNOSIS — Z89512 Acquired absence of left leg below knee: Secondary | ICD-10-CM | POA: Diagnosis not present

## 2023-07-02 DIAGNOSIS — I4891 Unspecified atrial fibrillation: Secondary | ICD-10-CM | POA: Diagnosis not present

## 2023-07-02 DIAGNOSIS — E1122 Type 2 diabetes mellitus with diabetic chronic kidney disease: Secondary | ICD-10-CM | POA: Diagnosis not present

## 2023-07-02 DIAGNOSIS — Z4800 Encounter for change or removal of nonsurgical wound dressing: Secondary | ICD-10-CM | POA: Diagnosis not present

## 2023-07-02 DIAGNOSIS — Z89511 Acquired absence of right leg below knee: Secondary | ICD-10-CM | POA: Diagnosis not present

## 2023-07-02 DIAGNOSIS — Z89021 Acquired absence of right finger(s): Secondary | ICD-10-CM | POA: Diagnosis not present

## 2023-07-02 DIAGNOSIS — Z7984 Long term (current) use of oral hypoglycemic drugs: Secondary | ICD-10-CM | POA: Diagnosis not present

## 2023-07-02 DIAGNOSIS — I129 Hypertensive chronic kidney disease with stage 1 through stage 4 chronic kidney disease, or unspecified chronic kidney disease: Secondary | ICD-10-CM | POA: Diagnosis not present

## 2023-07-02 DIAGNOSIS — M1990 Unspecified osteoarthritis, unspecified site: Secondary | ICD-10-CM | POA: Diagnosis not present

## 2023-07-02 DIAGNOSIS — K5790 Diverticulosis of intestine, part unspecified, without perforation or abscess without bleeding: Secondary | ICD-10-CM | POA: Diagnosis not present

## 2023-07-05 DIAGNOSIS — E1122 Type 2 diabetes mellitus with diabetic chronic kidney disease: Secondary | ICD-10-CM | POA: Diagnosis not present

## 2023-07-05 DIAGNOSIS — N189 Chronic kidney disease, unspecified: Secondary | ICD-10-CM | POA: Diagnosis not present

## 2023-07-05 DIAGNOSIS — E7800 Pure hypercholesterolemia, unspecified: Secondary | ICD-10-CM | POA: Diagnosis not present

## 2023-07-05 DIAGNOSIS — I10 Essential (primary) hypertension: Secondary | ICD-10-CM | POA: Diagnosis not present

## 2023-07-05 DIAGNOSIS — Z4800 Encounter for change or removal of nonsurgical wound dressing: Secondary | ICD-10-CM | POA: Diagnosis not present

## 2023-07-05 DIAGNOSIS — E1142 Type 2 diabetes mellitus with diabetic polyneuropathy: Secondary | ICD-10-CM | POA: Diagnosis not present

## 2023-07-05 DIAGNOSIS — E782 Mixed hyperlipidemia: Secondary | ICD-10-CM | POA: Diagnosis not present

## 2023-07-05 DIAGNOSIS — L89892 Pressure ulcer of other site, stage 2: Secondary | ICD-10-CM | POA: Diagnosis not present

## 2023-07-05 DIAGNOSIS — E7849 Other hyperlipidemia: Secondary | ICD-10-CM | POA: Diagnosis not present

## 2023-07-05 DIAGNOSIS — K5790 Diverticulosis of intestine, part unspecified, without perforation or abscess without bleeding: Secondary | ICD-10-CM | POA: Diagnosis not present

## 2023-07-05 DIAGNOSIS — E7801 Familial hypercholesterolemia: Secondary | ICD-10-CM | POA: Diagnosis not present

## 2023-07-05 DIAGNOSIS — I129 Hypertensive chronic kidney disease with stage 1 through stage 4 chronic kidney disease, or unspecified chronic kidney disease: Secondary | ICD-10-CM | POA: Diagnosis not present

## 2023-07-05 DIAGNOSIS — E1165 Type 2 diabetes mellitus with hyperglycemia: Secondary | ICD-10-CM | POA: Diagnosis not present

## 2023-07-12 DIAGNOSIS — N189 Chronic kidney disease, unspecified: Secondary | ICD-10-CM | POA: Diagnosis not present

## 2023-07-12 DIAGNOSIS — L89892 Pressure ulcer of other site, stage 2: Secondary | ICD-10-CM | POA: Diagnosis not present

## 2023-07-12 DIAGNOSIS — I129 Hypertensive chronic kidney disease with stage 1 through stage 4 chronic kidney disease, or unspecified chronic kidney disease: Secondary | ICD-10-CM | POA: Diagnosis not present

## 2023-07-12 DIAGNOSIS — K5790 Diverticulosis of intestine, part unspecified, without perforation or abscess without bleeding: Secondary | ICD-10-CM | POA: Diagnosis not present

## 2023-07-12 DIAGNOSIS — Z4800 Encounter for change or removal of nonsurgical wound dressing: Secondary | ICD-10-CM | POA: Diagnosis not present

## 2023-07-12 DIAGNOSIS — E1122 Type 2 diabetes mellitus with diabetic chronic kidney disease: Secondary | ICD-10-CM | POA: Diagnosis not present

## 2023-07-13 ENCOUNTER — Ambulatory Visit: Payer: Medicare Other | Attending: Cardiology | Admitting: *Deleted

## 2023-07-13 DIAGNOSIS — I82603 Acute embolism and thrombosis of unspecified veins of upper extremity, bilateral: Secondary | ICD-10-CM | POA: Diagnosis not present

## 2023-07-13 DIAGNOSIS — Z9889 Other specified postprocedural states: Secondary | ICD-10-CM | POA: Diagnosis not present

## 2023-07-13 DIAGNOSIS — I4891 Unspecified atrial fibrillation: Secondary | ICD-10-CM

## 2023-07-13 DIAGNOSIS — Q231 Congenital insufficiency of aortic valve: Secondary | ICD-10-CM | POA: Diagnosis not present

## 2023-07-13 DIAGNOSIS — I9789 Other postprocedural complications and disorders of the circulatory system, not elsewhere classified: Secondary | ICD-10-CM | POA: Insufficient documentation

## 2023-07-13 LAB — POCT INR: INR: 1.7 — AB (ref 2.0–3.0)

## 2023-07-13 NOTE — Patient Instructions (Signed)
Take warfarin 2 1/2 tablets tonight then resume 1 tablet daily except 2 tablets on Mondays, Wednesdays and Fridays. Recheck in 3 wks

## 2023-07-15 DIAGNOSIS — N401 Enlarged prostate with lower urinary tract symptoms: Secondary | ICD-10-CM | POA: Diagnosis not present

## 2023-07-15 DIAGNOSIS — I5033 Acute on chronic diastolic (congestive) heart failure: Secondary | ICD-10-CM | POA: Diagnosis not present

## 2023-07-15 DIAGNOSIS — I10 Essential (primary) hypertension: Secondary | ICD-10-CM | POA: Diagnosis not present

## 2023-07-15 DIAGNOSIS — M810 Age-related osteoporosis without current pathological fracture: Secondary | ICD-10-CM | POA: Diagnosis not present

## 2023-07-15 DIAGNOSIS — D649 Anemia, unspecified: Secondary | ICD-10-CM | POA: Diagnosis not present

## 2023-07-15 DIAGNOSIS — Z89512 Acquired absence of left leg below knee: Secondary | ICD-10-CM | POA: Diagnosis not present

## 2023-07-15 DIAGNOSIS — S68412A Complete traumatic amputation of left hand at wrist level, initial encounter: Secondary | ICD-10-CM | POA: Diagnosis not present

## 2023-07-15 DIAGNOSIS — R197 Diarrhea, unspecified: Secondary | ICD-10-CM | POA: Diagnosis not present

## 2023-07-15 DIAGNOSIS — G2581 Restless legs syndrome: Secondary | ICD-10-CM | POA: Diagnosis not present

## 2023-07-15 DIAGNOSIS — Z8679 Personal history of other diseases of the circulatory system: Secondary | ICD-10-CM | POA: Diagnosis not present

## 2023-07-15 DIAGNOSIS — E7849 Other hyperlipidemia: Secondary | ICD-10-CM | POA: Diagnosis not present

## 2023-07-15 DIAGNOSIS — E1140 Type 2 diabetes mellitus with diabetic neuropathy, unspecified: Secondary | ICD-10-CM | POA: Diagnosis not present

## 2023-07-19 DIAGNOSIS — Z4800 Encounter for change or removal of nonsurgical wound dressing: Secondary | ICD-10-CM | POA: Diagnosis not present

## 2023-07-19 DIAGNOSIS — I129 Hypertensive chronic kidney disease with stage 1 through stage 4 chronic kidney disease, or unspecified chronic kidney disease: Secondary | ICD-10-CM | POA: Diagnosis not present

## 2023-07-19 DIAGNOSIS — E1122 Type 2 diabetes mellitus with diabetic chronic kidney disease: Secondary | ICD-10-CM | POA: Diagnosis not present

## 2023-07-19 DIAGNOSIS — L89892 Pressure ulcer of other site, stage 2: Secondary | ICD-10-CM | POA: Diagnosis not present

## 2023-07-19 DIAGNOSIS — K5790 Diverticulosis of intestine, part unspecified, without perforation or abscess without bleeding: Secondary | ICD-10-CM | POA: Diagnosis not present

## 2023-07-19 DIAGNOSIS — N189 Chronic kidney disease, unspecified: Secondary | ICD-10-CM | POA: Diagnosis not present

## 2023-07-27 DIAGNOSIS — E1122 Type 2 diabetes mellitus with diabetic chronic kidney disease: Secondary | ICD-10-CM | POA: Diagnosis not present

## 2023-07-27 DIAGNOSIS — Z4800 Encounter for change or removal of nonsurgical wound dressing: Secondary | ICD-10-CM | POA: Diagnosis not present

## 2023-07-27 DIAGNOSIS — K5790 Diverticulosis of intestine, part unspecified, without perforation or abscess without bleeding: Secondary | ICD-10-CM | POA: Diagnosis not present

## 2023-07-27 DIAGNOSIS — I129 Hypertensive chronic kidney disease with stage 1 through stage 4 chronic kidney disease, or unspecified chronic kidney disease: Secondary | ICD-10-CM | POA: Diagnosis not present

## 2023-07-27 DIAGNOSIS — L89892 Pressure ulcer of other site, stage 2: Secondary | ICD-10-CM | POA: Diagnosis not present

## 2023-07-27 DIAGNOSIS — N189 Chronic kidney disease, unspecified: Secondary | ICD-10-CM | POA: Diagnosis not present

## 2023-07-30 ENCOUNTER — Other Ambulatory Visit: Payer: Self-pay | Admitting: Cardiology

## 2023-08-01 DIAGNOSIS — F1090 Alcohol use, unspecified, uncomplicated: Secondary | ICD-10-CM | POA: Diagnosis not present

## 2023-08-01 DIAGNOSIS — B009 Herpesviral infection, unspecified: Secondary | ICD-10-CM | POA: Diagnosis not present

## 2023-08-01 DIAGNOSIS — N529 Male erectile dysfunction, unspecified: Secondary | ICD-10-CM | POA: Diagnosis not present

## 2023-08-01 DIAGNOSIS — Z89022 Acquired absence of left finger(s): Secondary | ICD-10-CM | POA: Diagnosis not present

## 2023-08-01 DIAGNOSIS — K5790 Diverticulosis of intestine, part unspecified, without perforation or abscess without bleeding: Secondary | ICD-10-CM | POA: Diagnosis not present

## 2023-08-01 DIAGNOSIS — Z89511 Acquired absence of right leg below knee: Secondary | ICD-10-CM | POA: Diagnosis not present

## 2023-08-01 DIAGNOSIS — Z7901 Long term (current) use of anticoagulants: Secondary | ICD-10-CM | POA: Diagnosis not present

## 2023-08-01 DIAGNOSIS — M810 Age-related osteoporosis without current pathological fracture: Secondary | ICD-10-CM | POA: Diagnosis not present

## 2023-08-01 DIAGNOSIS — I4891 Unspecified atrial fibrillation: Secondary | ICD-10-CM | POA: Diagnosis not present

## 2023-08-01 DIAGNOSIS — Z89512 Acquired absence of left leg below knee: Secondary | ICD-10-CM | POA: Diagnosis not present

## 2023-08-01 DIAGNOSIS — Z4800 Encounter for change or removal of nonsurgical wound dressing: Secondary | ICD-10-CM | POA: Diagnosis not present

## 2023-08-01 DIAGNOSIS — I129 Hypertensive chronic kidney disease with stage 1 through stage 4 chronic kidney disease, or unspecified chronic kidney disease: Secondary | ICD-10-CM | POA: Diagnosis not present

## 2023-08-01 DIAGNOSIS — Z89021 Acquired absence of right finger(s): Secondary | ICD-10-CM | POA: Diagnosis not present

## 2023-08-01 DIAGNOSIS — G2581 Restless legs syndrome: Secondary | ICD-10-CM | POA: Diagnosis not present

## 2023-08-01 DIAGNOSIS — M1990 Unspecified osteoarthritis, unspecified site: Secondary | ICD-10-CM | POA: Diagnosis not present

## 2023-08-01 DIAGNOSIS — M109 Gout, unspecified: Secondary | ICD-10-CM | POA: Diagnosis not present

## 2023-08-01 DIAGNOSIS — E1122 Type 2 diabetes mellitus with diabetic chronic kidney disease: Secondary | ICD-10-CM | POA: Diagnosis not present

## 2023-08-01 DIAGNOSIS — N189 Chronic kidney disease, unspecified: Secondary | ICD-10-CM | POA: Diagnosis not present

## 2023-08-01 DIAGNOSIS — N400 Enlarged prostate without lower urinary tract symptoms: Secondary | ICD-10-CM | POA: Diagnosis not present

## 2023-08-01 DIAGNOSIS — Z7984 Long term (current) use of oral hypoglycemic drugs: Secondary | ICD-10-CM | POA: Diagnosis not present

## 2023-08-01 DIAGNOSIS — E7800 Pure hypercholesterolemia, unspecified: Secondary | ICD-10-CM | POA: Diagnosis not present

## 2023-08-01 DIAGNOSIS — L89892 Pressure ulcer of other site, stage 2: Secondary | ICD-10-CM | POA: Diagnosis not present

## 2023-08-02 DIAGNOSIS — K5790 Diverticulosis of intestine, part unspecified, without perforation or abscess without bleeding: Secondary | ICD-10-CM | POA: Diagnosis not present

## 2023-08-02 DIAGNOSIS — N189 Chronic kidney disease, unspecified: Secondary | ICD-10-CM | POA: Diagnosis not present

## 2023-08-02 DIAGNOSIS — Z4800 Encounter for change or removal of nonsurgical wound dressing: Secondary | ICD-10-CM | POA: Diagnosis not present

## 2023-08-02 DIAGNOSIS — E1122 Type 2 diabetes mellitus with diabetic chronic kidney disease: Secondary | ICD-10-CM | POA: Diagnosis not present

## 2023-08-02 DIAGNOSIS — I129 Hypertensive chronic kidney disease with stage 1 through stage 4 chronic kidney disease, or unspecified chronic kidney disease: Secondary | ICD-10-CM | POA: Diagnosis not present

## 2023-08-02 DIAGNOSIS — L89892 Pressure ulcer of other site, stage 2: Secondary | ICD-10-CM | POA: Diagnosis not present

## 2023-08-03 ENCOUNTER — Ambulatory Visit: Payer: Medicare Other | Attending: Cardiology | Admitting: *Deleted

## 2023-08-03 DIAGNOSIS — I9789 Other postprocedural complications and disorders of the circulatory system, not elsewhere classified: Secondary | ICD-10-CM | POA: Diagnosis not present

## 2023-08-03 DIAGNOSIS — I82603 Acute embolism and thrombosis of unspecified veins of upper extremity, bilateral: Secondary | ICD-10-CM | POA: Insufficient documentation

## 2023-08-03 DIAGNOSIS — Q231 Congenital insufficiency of aortic valve: Secondary | ICD-10-CM | POA: Insufficient documentation

## 2023-08-03 DIAGNOSIS — I4891 Unspecified atrial fibrillation: Secondary | ICD-10-CM | POA: Diagnosis not present

## 2023-08-03 DIAGNOSIS — Z9889 Other specified postprocedural states: Secondary | ICD-10-CM | POA: Insufficient documentation

## 2023-08-03 LAB — POCT INR: INR: 2.2 (ref 2.0–3.0)

## 2023-08-03 NOTE — Patient Instructions (Signed)
Continue warfarin 1 tablet daily except 2 tablets on Mondays, Wednesdays and Fridays. Recheck in 4 wks 

## 2023-08-06 DIAGNOSIS — E1122 Type 2 diabetes mellitus with diabetic chronic kidney disease: Secondary | ICD-10-CM | POA: Diagnosis not present

## 2023-08-06 DIAGNOSIS — I4891 Unspecified atrial fibrillation: Secondary | ICD-10-CM | POA: Diagnosis not present

## 2023-08-06 DIAGNOSIS — Z7984 Long term (current) use of oral hypoglycemic drugs: Secondary | ICD-10-CM | POA: Diagnosis not present

## 2023-08-06 DIAGNOSIS — M109 Gout, unspecified: Secondary | ICD-10-CM | POA: Diagnosis not present

## 2023-08-06 DIAGNOSIS — Z7982 Long term (current) use of aspirin: Secondary | ICD-10-CM | POA: Diagnosis not present

## 2023-08-06 DIAGNOSIS — L89892 Pressure ulcer of other site, stage 2: Secondary | ICD-10-CM | POA: Diagnosis not present

## 2023-08-06 DIAGNOSIS — M810 Age-related osteoporosis without current pathological fracture: Secondary | ICD-10-CM | POA: Diagnosis not present

## 2023-08-06 DIAGNOSIS — M1990 Unspecified osteoarthritis, unspecified site: Secondary | ICD-10-CM | POA: Diagnosis not present

## 2023-08-06 DIAGNOSIS — Z882 Allergy status to sulfonamides status: Secondary | ICD-10-CM | POA: Diagnosis not present

## 2023-08-06 DIAGNOSIS — N189 Chronic kidney disease, unspecified: Secondary | ICD-10-CM | POA: Diagnosis not present

## 2023-08-06 DIAGNOSIS — E7800 Pure hypercholesterolemia, unspecified: Secondary | ICD-10-CM | POA: Diagnosis not present

## 2023-08-06 DIAGNOSIS — Z881 Allergy status to other antibiotic agents status: Secondary | ICD-10-CM | POA: Diagnosis not present

## 2023-08-06 DIAGNOSIS — N400 Enlarged prostate without lower urinary tract symptoms: Secondary | ICD-10-CM | POA: Diagnosis not present

## 2023-08-06 DIAGNOSIS — I129 Hypertensive chronic kidney disease with stage 1 through stage 4 chronic kidney disease, or unspecified chronic kidney disease: Secondary | ICD-10-CM | POA: Diagnosis not present

## 2023-08-06 DIAGNOSIS — Z880 Allergy status to penicillin: Secondary | ICD-10-CM | POA: Diagnosis not present

## 2023-08-06 DIAGNOSIS — Z79899 Other long term (current) drug therapy: Secondary | ICD-10-CM | POA: Diagnosis not present

## 2023-08-06 DIAGNOSIS — Z7901 Long term (current) use of anticoagulants: Secondary | ICD-10-CM | POA: Diagnosis not present

## 2023-08-09 ENCOUNTER — Other Ambulatory Visit: Payer: Self-pay | Admitting: Cardiology

## 2023-08-09 DIAGNOSIS — Z4800 Encounter for change or removal of nonsurgical wound dressing: Secondary | ICD-10-CM | POA: Diagnosis not present

## 2023-08-09 DIAGNOSIS — I129 Hypertensive chronic kidney disease with stage 1 through stage 4 chronic kidney disease, or unspecified chronic kidney disease: Secondary | ICD-10-CM | POA: Diagnosis not present

## 2023-08-09 DIAGNOSIS — K5790 Diverticulosis of intestine, part unspecified, without perforation or abscess without bleeding: Secondary | ICD-10-CM | POA: Diagnosis not present

## 2023-08-09 DIAGNOSIS — L89892 Pressure ulcer of other site, stage 2: Secondary | ICD-10-CM | POA: Diagnosis not present

## 2023-08-09 DIAGNOSIS — E1122 Type 2 diabetes mellitus with diabetic chronic kidney disease: Secondary | ICD-10-CM | POA: Diagnosis not present

## 2023-08-09 DIAGNOSIS — N189 Chronic kidney disease, unspecified: Secondary | ICD-10-CM | POA: Diagnosis not present

## 2023-08-10 ENCOUNTER — Other Ambulatory Visit: Payer: Self-pay | Admitting: Cardiology

## 2023-08-16 DIAGNOSIS — N189 Chronic kidney disease, unspecified: Secondary | ICD-10-CM | POA: Diagnosis not present

## 2023-08-16 DIAGNOSIS — Z4800 Encounter for change or removal of nonsurgical wound dressing: Secondary | ICD-10-CM | POA: Diagnosis not present

## 2023-08-16 DIAGNOSIS — I129 Hypertensive chronic kidney disease with stage 1 through stage 4 chronic kidney disease, or unspecified chronic kidney disease: Secondary | ICD-10-CM | POA: Diagnosis not present

## 2023-08-16 DIAGNOSIS — L89892 Pressure ulcer of other site, stage 2: Secondary | ICD-10-CM | POA: Diagnosis not present

## 2023-08-16 DIAGNOSIS — E1122 Type 2 diabetes mellitus with diabetic chronic kidney disease: Secondary | ICD-10-CM | POA: Diagnosis not present

## 2023-08-16 DIAGNOSIS — K5790 Diverticulosis of intestine, part unspecified, without perforation or abscess without bleeding: Secondary | ICD-10-CM | POA: Diagnosis not present

## 2023-08-24 DIAGNOSIS — L89892 Pressure ulcer of other site, stage 2: Secondary | ICD-10-CM | POA: Diagnosis not present

## 2023-08-24 DIAGNOSIS — E7800 Pure hypercholesterolemia, unspecified: Secondary | ICD-10-CM | POA: Diagnosis not present

## 2023-08-24 DIAGNOSIS — I129 Hypertensive chronic kidney disease with stage 1 through stage 4 chronic kidney disease, or unspecified chronic kidney disease: Secondary | ICD-10-CM | POA: Diagnosis not present

## 2023-08-24 DIAGNOSIS — M109 Gout, unspecified: Secondary | ICD-10-CM | POA: Diagnosis not present

## 2023-08-24 DIAGNOSIS — N189 Chronic kidney disease, unspecified: Secondary | ICD-10-CM | POA: Diagnosis not present

## 2023-08-24 DIAGNOSIS — E1122 Type 2 diabetes mellitus with diabetic chronic kidney disease: Secondary | ICD-10-CM | POA: Diagnosis not present

## 2023-08-25 ENCOUNTER — Other Ambulatory Visit: Payer: Self-pay | Admitting: Cardiology

## 2023-08-26 DIAGNOSIS — I129 Hypertensive chronic kidney disease with stage 1 through stage 4 chronic kidney disease, or unspecified chronic kidney disease: Secondary | ICD-10-CM | POA: Diagnosis not present

## 2023-08-26 DIAGNOSIS — N189 Chronic kidney disease, unspecified: Secondary | ICD-10-CM | POA: Diagnosis not present

## 2023-08-26 DIAGNOSIS — K5790 Diverticulosis of intestine, part unspecified, without perforation or abscess without bleeding: Secondary | ICD-10-CM | POA: Diagnosis not present

## 2023-08-26 DIAGNOSIS — Z4800 Encounter for change or removal of nonsurgical wound dressing: Secondary | ICD-10-CM | POA: Diagnosis not present

## 2023-08-26 DIAGNOSIS — E1122 Type 2 diabetes mellitus with diabetic chronic kidney disease: Secondary | ICD-10-CM | POA: Diagnosis not present

## 2023-08-26 DIAGNOSIS — L89892 Pressure ulcer of other site, stage 2: Secondary | ICD-10-CM | POA: Diagnosis not present

## 2023-08-31 ENCOUNTER — Ambulatory Visit: Payer: Medicare Other | Attending: Cardiology | Admitting: *Deleted

## 2023-08-31 DIAGNOSIS — Z89511 Acquired absence of right leg below knee: Secondary | ICD-10-CM | POA: Diagnosis not present

## 2023-08-31 DIAGNOSIS — N400 Enlarged prostate without lower urinary tract symptoms: Secondary | ICD-10-CM | POA: Diagnosis not present

## 2023-08-31 DIAGNOSIS — N189 Chronic kidney disease, unspecified: Secondary | ICD-10-CM | POA: Diagnosis not present

## 2023-08-31 DIAGNOSIS — I82603 Acute embolism and thrombosis of unspecified veins of upper extremity, bilateral: Secondary | ICD-10-CM | POA: Insufficient documentation

## 2023-08-31 DIAGNOSIS — M109 Gout, unspecified: Secondary | ICD-10-CM | POA: Diagnosis not present

## 2023-08-31 DIAGNOSIS — I9789 Other postprocedural complications and disorders of the circulatory system, not elsewhere classified: Secondary | ICD-10-CM | POA: Diagnosis not present

## 2023-08-31 DIAGNOSIS — Z89022 Acquired absence of left finger(s): Secondary | ICD-10-CM | POA: Diagnosis not present

## 2023-08-31 DIAGNOSIS — Z7901 Long term (current) use of anticoagulants: Secondary | ICD-10-CM | POA: Diagnosis not present

## 2023-08-31 DIAGNOSIS — Q2381 Bicuspid aortic valve: Secondary | ICD-10-CM | POA: Diagnosis not present

## 2023-08-31 DIAGNOSIS — Z7984 Long term (current) use of oral hypoglycemic drugs: Secondary | ICD-10-CM | POA: Diagnosis not present

## 2023-08-31 DIAGNOSIS — B009 Herpesviral infection, unspecified: Secondary | ICD-10-CM | POA: Diagnosis not present

## 2023-08-31 DIAGNOSIS — G2581 Restless legs syndrome: Secondary | ICD-10-CM | POA: Diagnosis not present

## 2023-08-31 DIAGNOSIS — Z89512 Acquired absence of left leg below knee: Secondary | ICD-10-CM | POA: Diagnosis not present

## 2023-08-31 DIAGNOSIS — K5790 Diverticulosis of intestine, part unspecified, without perforation or abscess without bleeding: Secondary | ICD-10-CM | POA: Diagnosis not present

## 2023-08-31 DIAGNOSIS — F1090 Alcohol use, unspecified, uncomplicated: Secondary | ICD-10-CM | POA: Diagnosis not present

## 2023-08-31 DIAGNOSIS — Z9889 Other specified postprocedural states: Secondary | ICD-10-CM | POA: Insufficient documentation

## 2023-08-31 DIAGNOSIS — Z4800 Encounter for change or removal of nonsurgical wound dressing: Secondary | ICD-10-CM | POA: Diagnosis not present

## 2023-08-31 DIAGNOSIS — I4891 Unspecified atrial fibrillation: Secondary | ICD-10-CM | POA: Diagnosis not present

## 2023-08-31 DIAGNOSIS — M810 Age-related osteoporosis without current pathological fracture: Secondary | ICD-10-CM | POA: Diagnosis not present

## 2023-08-31 DIAGNOSIS — L89892 Pressure ulcer of other site, stage 2: Secondary | ICD-10-CM | POA: Diagnosis not present

## 2023-08-31 DIAGNOSIS — E7800 Pure hypercholesterolemia, unspecified: Secondary | ICD-10-CM | POA: Diagnosis not present

## 2023-08-31 DIAGNOSIS — M1990 Unspecified osteoarthritis, unspecified site: Secondary | ICD-10-CM | POA: Diagnosis not present

## 2023-08-31 DIAGNOSIS — N529 Male erectile dysfunction, unspecified: Secondary | ICD-10-CM | POA: Diagnosis not present

## 2023-08-31 DIAGNOSIS — E1122 Type 2 diabetes mellitus with diabetic chronic kidney disease: Secondary | ICD-10-CM | POA: Diagnosis not present

## 2023-08-31 DIAGNOSIS — I129 Hypertensive chronic kidney disease with stage 1 through stage 4 chronic kidney disease, or unspecified chronic kidney disease: Secondary | ICD-10-CM | POA: Diagnosis not present

## 2023-08-31 DIAGNOSIS — Z89021 Acquired absence of right finger(s): Secondary | ICD-10-CM | POA: Diagnosis not present

## 2023-08-31 LAB — POCT INR: INR: 2.8 (ref 2.0–3.0)

## 2023-08-31 NOTE — Patient Instructions (Signed)
Continue warfarin 1 tablet daily except 2 tablets on Mondays, Wednesdays and Fridays. Recheck in 4 wks 

## 2023-09-02 DIAGNOSIS — Z4800 Encounter for change or removal of nonsurgical wound dressing: Secondary | ICD-10-CM | POA: Diagnosis not present

## 2023-09-02 DIAGNOSIS — L89892 Pressure ulcer of other site, stage 2: Secondary | ICD-10-CM | POA: Diagnosis not present

## 2023-09-02 DIAGNOSIS — N189 Chronic kidney disease, unspecified: Secondary | ICD-10-CM | POA: Diagnosis not present

## 2023-09-02 DIAGNOSIS — E1122 Type 2 diabetes mellitus with diabetic chronic kidney disease: Secondary | ICD-10-CM | POA: Diagnosis not present

## 2023-09-02 DIAGNOSIS — K5790 Diverticulosis of intestine, part unspecified, without perforation or abscess without bleeding: Secondary | ICD-10-CM | POA: Diagnosis not present

## 2023-09-02 DIAGNOSIS — I129 Hypertensive chronic kidney disease with stage 1 through stage 4 chronic kidney disease, or unspecified chronic kidney disease: Secondary | ICD-10-CM | POA: Diagnosis not present

## 2023-09-07 DIAGNOSIS — Z79899 Other long term (current) drug therapy: Secondary | ICD-10-CM | POA: Diagnosis not present

## 2023-09-07 DIAGNOSIS — N189 Chronic kidney disease, unspecified: Secondary | ICD-10-CM | POA: Diagnosis not present

## 2023-09-07 DIAGNOSIS — G2581 Restless legs syndrome: Secondary | ICD-10-CM | POA: Diagnosis not present

## 2023-09-07 DIAGNOSIS — Z7982 Long term (current) use of aspirin: Secondary | ICD-10-CM | POA: Diagnosis not present

## 2023-09-07 DIAGNOSIS — I4891 Unspecified atrial fibrillation: Secondary | ICD-10-CM | POA: Diagnosis not present

## 2023-09-07 DIAGNOSIS — Z7901 Long term (current) use of anticoagulants: Secondary | ICD-10-CM | POA: Diagnosis not present

## 2023-09-07 DIAGNOSIS — E7800 Pure hypercholesterolemia, unspecified: Secondary | ICD-10-CM | POA: Diagnosis not present

## 2023-09-07 DIAGNOSIS — L89892 Pressure ulcer of other site, stage 2: Secondary | ICD-10-CM | POA: Diagnosis not present

## 2023-09-07 DIAGNOSIS — Z882 Allergy status to sulfonamides status: Secondary | ICD-10-CM | POA: Diagnosis not present

## 2023-09-07 DIAGNOSIS — Z880 Allergy status to penicillin: Secondary | ICD-10-CM | POA: Diagnosis not present

## 2023-09-07 DIAGNOSIS — I129 Hypertensive chronic kidney disease with stage 1 through stage 4 chronic kidney disease, or unspecified chronic kidney disease: Secondary | ICD-10-CM | POA: Diagnosis not present

## 2023-09-07 DIAGNOSIS — E1122 Type 2 diabetes mellitus with diabetic chronic kidney disease: Secondary | ICD-10-CM | POA: Diagnosis not present

## 2023-09-07 DIAGNOSIS — T84063D Wear of articular bearing surface of internal prosthetic left knee joint, subsequent encounter: Secondary | ICD-10-CM | POA: Diagnosis not present

## 2023-09-07 DIAGNOSIS — Z7984 Long term (current) use of oral hypoglycemic drugs: Secondary | ICD-10-CM | POA: Diagnosis not present

## 2023-09-07 DIAGNOSIS — M109 Gout, unspecified: Secondary | ICD-10-CM | POA: Diagnosis not present

## 2023-09-09 DIAGNOSIS — L89892 Pressure ulcer of other site, stage 2: Secondary | ICD-10-CM | POA: Diagnosis not present

## 2023-09-09 DIAGNOSIS — E1122 Type 2 diabetes mellitus with diabetic chronic kidney disease: Secondary | ICD-10-CM | POA: Diagnosis not present

## 2023-09-09 DIAGNOSIS — I129 Hypertensive chronic kidney disease with stage 1 through stage 4 chronic kidney disease, or unspecified chronic kidney disease: Secondary | ICD-10-CM | POA: Diagnosis not present

## 2023-09-09 DIAGNOSIS — N189 Chronic kidney disease, unspecified: Secondary | ICD-10-CM | POA: Diagnosis not present

## 2023-09-09 DIAGNOSIS — K5790 Diverticulosis of intestine, part unspecified, without perforation or abscess without bleeding: Secondary | ICD-10-CM | POA: Diagnosis not present

## 2023-09-09 DIAGNOSIS — Z4800 Encounter for change or removal of nonsurgical wound dressing: Secondary | ICD-10-CM | POA: Diagnosis not present

## 2023-09-14 DIAGNOSIS — Z4800 Encounter for change or removal of nonsurgical wound dressing: Secondary | ICD-10-CM | POA: Diagnosis not present

## 2023-09-14 DIAGNOSIS — I129 Hypertensive chronic kidney disease with stage 1 through stage 4 chronic kidney disease, or unspecified chronic kidney disease: Secondary | ICD-10-CM | POA: Diagnosis not present

## 2023-09-14 DIAGNOSIS — L89892 Pressure ulcer of other site, stage 2: Secondary | ICD-10-CM | POA: Diagnosis not present

## 2023-09-14 DIAGNOSIS — N189 Chronic kidney disease, unspecified: Secondary | ICD-10-CM | POA: Diagnosis not present

## 2023-09-14 DIAGNOSIS — K5790 Diverticulosis of intestine, part unspecified, without perforation or abscess without bleeding: Secondary | ICD-10-CM | POA: Diagnosis not present

## 2023-09-14 DIAGNOSIS — E1122 Type 2 diabetes mellitus with diabetic chronic kidney disease: Secondary | ICD-10-CM | POA: Diagnosis not present

## 2023-09-22 DIAGNOSIS — E1122 Type 2 diabetes mellitus with diabetic chronic kidney disease: Secondary | ICD-10-CM | POA: Diagnosis not present

## 2023-09-22 DIAGNOSIS — Z4800 Encounter for change or removal of nonsurgical wound dressing: Secondary | ICD-10-CM | POA: Diagnosis not present

## 2023-09-22 DIAGNOSIS — K5790 Diverticulosis of intestine, part unspecified, without perforation or abscess without bleeding: Secondary | ICD-10-CM | POA: Diagnosis not present

## 2023-09-22 DIAGNOSIS — N189 Chronic kidney disease, unspecified: Secondary | ICD-10-CM | POA: Diagnosis not present

## 2023-09-22 DIAGNOSIS — I129 Hypertensive chronic kidney disease with stage 1 through stage 4 chronic kidney disease, or unspecified chronic kidney disease: Secondary | ICD-10-CM | POA: Diagnosis not present

## 2023-09-22 DIAGNOSIS — L89892 Pressure ulcer of other site, stage 2: Secondary | ICD-10-CM | POA: Diagnosis not present

## 2023-09-23 DIAGNOSIS — E1121 Type 2 diabetes mellitus with diabetic nephropathy: Secondary | ICD-10-CM | POA: Diagnosis not present

## 2023-09-23 DIAGNOSIS — E782 Mixed hyperlipidemia: Secondary | ICD-10-CM | POA: Diagnosis not present

## 2023-09-23 DIAGNOSIS — M810 Age-related osteoporosis without current pathological fracture: Secondary | ICD-10-CM | POA: Diagnosis not present

## 2023-09-23 DIAGNOSIS — I10 Essential (primary) hypertension: Secondary | ICD-10-CM | POA: Diagnosis not present

## 2023-09-28 ENCOUNTER — Ambulatory Visit: Payer: Medicare Other | Attending: Cardiology | Admitting: *Deleted

## 2023-09-28 DIAGNOSIS — Q2381 Bicuspid aortic valve: Secondary | ICD-10-CM | POA: Diagnosis not present

## 2023-09-28 DIAGNOSIS — I4891 Unspecified atrial fibrillation: Secondary | ICD-10-CM | POA: Diagnosis not present

## 2023-09-28 DIAGNOSIS — M1990 Unspecified osteoarthritis, unspecified site: Secondary | ICD-10-CM | POA: Diagnosis not present

## 2023-09-28 DIAGNOSIS — E1122 Type 2 diabetes mellitus with diabetic chronic kidney disease: Secondary | ICD-10-CM | POA: Diagnosis not present

## 2023-09-28 DIAGNOSIS — I82603 Acute embolism and thrombosis of unspecified veins of upper extremity, bilateral: Secondary | ICD-10-CM | POA: Insufficient documentation

## 2023-09-28 DIAGNOSIS — N400 Enlarged prostate without lower urinary tract symptoms: Secondary | ICD-10-CM | POA: Diagnosis not present

## 2023-09-28 DIAGNOSIS — Z79899 Other long term (current) drug therapy: Secondary | ICD-10-CM | POA: Diagnosis not present

## 2023-09-28 DIAGNOSIS — Z882 Allergy status to sulfonamides status: Secondary | ICD-10-CM | POA: Diagnosis not present

## 2023-09-28 DIAGNOSIS — M810 Age-related osteoporosis without current pathological fracture: Secondary | ICD-10-CM | POA: Diagnosis not present

## 2023-09-28 DIAGNOSIS — I9789 Other postprocedural complications and disorders of the circulatory system, not elsewhere classified: Secondary | ICD-10-CM | POA: Insufficient documentation

## 2023-09-28 DIAGNOSIS — L89892 Pressure ulcer of other site, stage 2: Secondary | ICD-10-CM | POA: Diagnosis not present

## 2023-09-28 DIAGNOSIS — Z881 Allergy status to other antibiotic agents status: Secondary | ICD-10-CM | POA: Diagnosis not present

## 2023-09-28 DIAGNOSIS — Z89512 Acquired absence of left leg below knee: Secondary | ICD-10-CM | POA: Diagnosis not present

## 2023-09-28 DIAGNOSIS — N189 Chronic kidney disease, unspecified: Secondary | ICD-10-CM | POA: Diagnosis not present

## 2023-09-28 DIAGNOSIS — Z9889 Other specified postprocedural states: Secondary | ICD-10-CM | POA: Diagnosis not present

## 2023-09-28 DIAGNOSIS — M109 Gout, unspecified: Secondary | ICD-10-CM | POA: Diagnosis not present

## 2023-09-28 DIAGNOSIS — I129 Hypertensive chronic kidney disease with stage 1 through stage 4 chronic kidney disease, or unspecified chronic kidney disease: Secondary | ICD-10-CM | POA: Diagnosis not present

## 2023-09-28 DIAGNOSIS — Z880 Allergy status to penicillin: Secondary | ICD-10-CM | POA: Diagnosis not present

## 2023-09-28 DIAGNOSIS — E7800 Pure hypercholesterolemia, unspecified: Secondary | ICD-10-CM | POA: Diagnosis not present

## 2023-09-28 DIAGNOSIS — L97829 Non-pressure chronic ulcer of other part of left lower leg with unspecified severity: Secondary | ICD-10-CM | POA: Diagnosis not present

## 2023-09-28 LAB — POCT INR: INR: 2.6 (ref 2.0–3.0)

## 2023-09-28 NOTE — Patient Instructions (Signed)
Continue warfarin 1 tablet daily except 2 tablets on Mondays, Wednesdays and Fridays. Recheck in 6 wks

## 2023-09-29 ENCOUNTER — Other Ambulatory Visit: Payer: Self-pay | Admitting: *Deleted

## 2023-09-29 DIAGNOSIS — Z4800 Encounter for change or removal of nonsurgical wound dressing: Secondary | ICD-10-CM | POA: Diagnosis not present

## 2023-09-29 DIAGNOSIS — K5790 Diverticulosis of intestine, part unspecified, without perforation or abscess without bleeding: Secondary | ICD-10-CM | POA: Diagnosis not present

## 2023-09-29 DIAGNOSIS — L89892 Pressure ulcer of other site, stage 2: Secondary | ICD-10-CM | POA: Diagnosis not present

## 2023-09-29 DIAGNOSIS — N189 Chronic kidney disease, unspecified: Secondary | ICD-10-CM | POA: Diagnosis not present

## 2023-09-29 DIAGNOSIS — I129 Hypertensive chronic kidney disease with stage 1 through stage 4 chronic kidney disease, or unspecified chronic kidney disease: Secondary | ICD-10-CM | POA: Diagnosis not present

## 2023-09-29 DIAGNOSIS — E1122 Type 2 diabetes mellitus with diabetic chronic kidney disease: Secondary | ICD-10-CM | POA: Diagnosis not present

## 2023-09-29 MED ORDER — WARFARIN SODIUM 2 MG PO TABS: ORAL_TABLET | ORAL | 1 refills | Status: DC

## 2023-09-29 NOTE — Telephone Encounter (Signed)
Refill request for warfarin:  Last INR was 2.6 on 09/28/23 Next INR due 11/09/23 LOV was 06/09/23  Refill approved.

## 2023-09-30 DIAGNOSIS — M109 Gout, unspecified: Secondary | ICD-10-CM | POA: Diagnosis not present

## 2023-09-30 DIAGNOSIS — M1990 Unspecified osteoarthritis, unspecified site: Secondary | ICD-10-CM | POA: Diagnosis not present

## 2023-09-30 DIAGNOSIS — Z89022 Acquired absence of left finger(s): Secondary | ICD-10-CM | POA: Diagnosis not present

## 2023-09-30 DIAGNOSIS — K5790 Diverticulosis of intestine, part unspecified, without perforation or abscess without bleeding: Secondary | ICD-10-CM | POA: Diagnosis not present

## 2023-09-30 DIAGNOSIS — I4891 Unspecified atrial fibrillation: Secondary | ICD-10-CM | POA: Diagnosis not present

## 2023-09-30 DIAGNOSIS — Z7901 Long term (current) use of anticoagulants: Secondary | ICD-10-CM | POA: Diagnosis not present

## 2023-09-30 DIAGNOSIS — Z4800 Encounter for change or removal of nonsurgical wound dressing: Secondary | ICD-10-CM | POA: Diagnosis not present

## 2023-09-30 DIAGNOSIS — Z89511 Acquired absence of right leg below knee: Secondary | ICD-10-CM | POA: Diagnosis not present

## 2023-09-30 DIAGNOSIS — Z89512 Acquired absence of left leg below knee: Secondary | ICD-10-CM | POA: Diagnosis not present

## 2023-09-30 DIAGNOSIS — I129 Hypertensive chronic kidney disease with stage 1 through stage 4 chronic kidney disease, or unspecified chronic kidney disease: Secondary | ICD-10-CM | POA: Diagnosis not present

## 2023-09-30 DIAGNOSIS — M810 Age-related osteoporosis without current pathological fracture: Secondary | ICD-10-CM | POA: Diagnosis not present

## 2023-09-30 DIAGNOSIS — N189 Chronic kidney disease, unspecified: Secondary | ICD-10-CM | POA: Diagnosis not present

## 2023-09-30 DIAGNOSIS — F1090 Alcohol use, unspecified, uncomplicated: Secondary | ICD-10-CM | POA: Diagnosis not present

## 2023-09-30 DIAGNOSIS — N400 Enlarged prostate without lower urinary tract symptoms: Secondary | ICD-10-CM | POA: Diagnosis not present

## 2023-09-30 DIAGNOSIS — E7800 Pure hypercholesterolemia, unspecified: Secondary | ICD-10-CM | POA: Diagnosis not present

## 2023-09-30 DIAGNOSIS — L89892 Pressure ulcer of other site, stage 2: Secondary | ICD-10-CM | POA: Diagnosis not present

## 2023-09-30 DIAGNOSIS — N529 Male erectile dysfunction, unspecified: Secondary | ICD-10-CM | POA: Diagnosis not present

## 2023-09-30 DIAGNOSIS — B009 Herpesviral infection, unspecified: Secondary | ICD-10-CM | POA: Diagnosis not present

## 2023-09-30 DIAGNOSIS — E1122 Type 2 diabetes mellitus with diabetic chronic kidney disease: Secondary | ICD-10-CM | POA: Diagnosis not present

## 2023-09-30 DIAGNOSIS — G2581 Restless legs syndrome: Secondary | ICD-10-CM | POA: Diagnosis not present

## 2023-09-30 DIAGNOSIS — Z7984 Long term (current) use of oral hypoglycemic drugs: Secondary | ICD-10-CM | POA: Diagnosis not present

## 2023-09-30 DIAGNOSIS — Z89021 Acquired absence of right finger(s): Secondary | ICD-10-CM | POA: Diagnosis not present

## 2023-10-05 DIAGNOSIS — Z4800 Encounter for change or removal of nonsurgical wound dressing: Secondary | ICD-10-CM | POA: Diagnosis not present

## 2023-10-05 DIAGNOSIS — E1122 Type 2 diabetes mellitus with diabetic chronic kidney disease: Secondary | ICD-10-CM | POA: Diagnosis not present

## 2023-10-05 DIAGNOSIS — K5790 Diverticulosis of intestine, part unspecified, without perforation or abscess without bleeding: Secondary | ICD-10-CM | POA: Diagnosis not present

## 2023-10-05 DIAGNOSIS — N189 Chronic kidney disease, unspecified: Secondary | ICD-10-CM | POA: Diagnosis not present

## 2023-10-05 DIAGNOSIS — I129 Hypertensive chronic kidney disease with stage 1 through stage 4 chronic kidney disease, or unspecified chronic kidney disease: Secondary | ICD-10-CM | POA: Diagnosis not present

## 2023-10-05 DIAGNOSIS — L89892 Pressure ulcer of other site, stage 2: Secondary | ICD-10-CM | POA: Diagnosis not present

## 2023-10-14 DIAGNOSIS — K5790 Diverticulosis of intestine, part unspecified, without perforation or abscess without bleeding: Secondary | ICD-10-CM | POA: Diagnosis not present

## 2023-10-14 DIAGNOSIS — N189 Chronic kidney disease, unspecified: Secondary | ICD-10-CM | POA: Diagnosis not present

## 2023-10-14 DIAGNOSIS — I129 Hypertensive chronic kidney disease with stage 1 through stage 4 chronic kidney disease, or unspecified chronic kidney disease: Secondary | ICD-10-CM | POA: Diagnosis not present

## 2023-10-14 DIAGNOSIS — Z4800 Encounter for change or removal of nonsurgical wound dressing: Secondary | ICD-10-CM | POA: Diagnosis not present

## 2023-10-14 DIAGNOSIS — E1122 Type 2 diabetes mellitus with diabetic chronic kidney disease: Secondary | ICD-10-CM | POA: Diagnosis not present

## 2023-10-14 DIAGNOSIS — L89892 Pressure ulcer of other site, stage 2: Secondary | ICD-10-CM | POA: Diagnosis not present

## 2023-10-19 DIAGNOSIS — S80819A Abrasion, unspecified lower leg, initial encounter: Secondary | ICD-10-CM | POA: Diagnosis not present

## 2023-10-19 DIAGNOSIS — R030 Elevated blood-pressure reading, without diagnosis of hypertension: Secondary | ICD-10-CM | POA: Diagnosis not present

## 2023-10-20 DIAGNOSIS — N189 Chronic kidney disease, unspecified: Secondary | ICD-10-CM | POA: Diagnosis not present

## 2023-10-20 DIAGNOSIS — K5790 Diverticulosis of intestine, part unspecified, without perforation or abscess without bleeding: Secondary | ICD-10-CM | POA: Diagnosis not present

## 2023-10-20 DIAGNOSIS — L89892 Pressure ulcer of other site, stage 2: Secondary | ICD-10-CM | POA: Diagnosis not present

## 2023-10-20 DIAGNOSIS — E1122 Type 2 diabetes mellitus with diabetic chronic kidney disease: Secondary | ICD-10-CM | POA: Diagnosis not present

## 2023-10-20 DIAGNOSIS — I129 Hypertensive chronic kidney disease with stage 1 through stage 4 chronic kidney disease, or unspecified chronic kidney disease: Secondary | ICD-10-CM | POA: Diagnosis not present

## 2023-10-20 DIAGNOSIS — Z4800 Encounter for change or removal of nonsurgical wound dressing: Secondary | ICD-10-CM | POA: Diagnosis not present

## 2023-10-28 DIAGNOSIS — Z4800 Encounter for change or removal of nonsurgical wound dressing: Secondary | ICD-10-CM | POA: Diagnosis not present

## 2023-10-28 DIAGNOSIS — K5790 Diverticulosis of intestine, part unspecified, without perforation or abscess without bleeding: Secondary | ICD-10-CM | POA: Diagnosis not present

## 2023-10-28 DIAGNOSIS — N189 Chronic kidney disease, unspecified: Secondary | ICD-10-CM | POA: Diagnosis not present

## 2023-10-28 DIAGNOSIS — L89892 Pressure ulcer of other site, stage 2: Secondary | ICD-10-CM | POA: Diagnosis not present

## 2023-10-28 DIAGNOSIS — E1122 Type 2 diabetes mellitus with diabetic chronic kidney disease: Secondary | ICD-10-CM | POA: Diagnosis not present

## 2023-10-28 DIAGNOSIS — I129 Hypertensive chronic kidney disease with stage 1 through stage 4 chronic kidney disease, or unspecified chronic kidney disease: Secondary | ICD-10-CM | POA: Diagnosis not present

## 2023-10-29 DIAGNOSIS — M109 Gout, unspecified: Secondary | ICD-10-CM | POA: Diagnosis not present

## 2023-10-29 DIAGNOSIS — L89892 Pressure ulcer of other site, stage 2: Secondary | ICD-10-CM | POA: Diagnosis not present

## 2023-10-29 DIAGNOSIS — E7800 Pure hypercholesterolemia, unspecified: Secondary | ICD-10-CM | POA: Diagnosis not present

## 2023-10-29 DIAGNOSIS — T8789 Other complications of amputation stump: Secondary | ICD-10-CM | POA: Diagnosis not present

## 2023-10-29 DIAGNOSIS — Z882 Allergy status to sulfonamides status: Secondary | ICD-10-CM | POA: Diagnosis not present

## 2023-10-29 DIAGNOSIS — N189 Chronic kidney disease, unspecified: Secondary | ICD-10-CM | POA: Diagnosis not present

## 2023-10-29 DIAGNOSIS — I129 Hypertensive chronic kidney disease with stage 1 through stage 4 chronic kidney disease, or unspecified chronic kidney disease: Secondary | ICD-10-CM | POA: Diagnosis not present

## 2023-10-29 DIAGNOSIS — Z880 Allergy status to penicillin: Secondary | ICD-10-CM | POA: Diagnosis not present

## 2023-10-29 DIAGNOSIS — E1122 Type 2 diabetes mellitus with diabetic chronic kidney disease: Secondary | ICD-10-CM | POA: Diagnosis not present

## 2023-10-30 DIAGNOSIS — G2581 Restless legs syndrome: Secondary | ICD-10-CM | POA: Diagnosis not present

## 2023-10-30 DIAGNOSIS — Z89512 Acquired absence of left leg below knee: Secondary | ICD-10-CM | POA: Diagnosis not present

## 2023-10-30 DIAGNOSIS — M1990 Unspecified osteoarthritis, unspecified site: Secondary | ICD-10-CM | POA: Diagnosis not present

## 2023-10-30 DIAGNOSIS — M109 Gout, unspecified: Secondary | ICD-10-CM | POA: Diagnosis not present

## 2023-10-30 DIAGNOSIS — I4891 Unspecified atrial fibrillation: Secondary | ICD-10-CM | POA: Diagnosis not present

## 2023-10-30 DIAGNOSIS — E7800 Pure hypercholesterolemia, unspecified: Secondary | ICD-10-CM | POA: Diagnosis not present

## 2023-10-30 DIAGNOSIS — E1122 Type 2 diabetes mellitus with diabetic chronic kidney disease: Secondary | ICD-10-CM | POA: Diagnosis not present

## 2023-10-30 DIAGNOSIS — N189 Chronic kidney disease, unspecified: Secondary | ICD-10-CM | POA: Diagnosis not present

## 2023-10-30 DIAGNOSIS — N529 Male erectile dysfunction, unspecified: Secondary | ICD-10-CM | POA: Diagnosis not present

## 2023-10-30 DIAGNOSIS — K5790 Diverticulosis of intestine, part unspecified, without perforation or abscess without bleeding: Secondary | ICD-10-CM | POA: Diagnosis not present

## 2023-10-30 DIAGNOSIS — B009 Herpesviral infection, unspecified: Secondary | ICD-10-CM | POA: Diagnosis not present

## 2023-10-30 DIAGNOSIS — Z89511 Acquired absence of right leg below knee: Secondary | ICD-10-CM | POA: Diagnosis not present

## 2023-10-30 DIAGNOSIS — L89892 Pressure ulcer of other site, stage 2: Secondary | ICD-10-CM | POA: Diagnosis not present

## 2023-10-30 DIAGNOSIS — Z4800 Encounter for change or removal of nonsurgical wound dressing: Secondary | ICD-10-CM | POA: Diagnosis not present

## 2023-10-30 DIAGNOSIS — Z7984 Long term (current) use of oral hypoglycemic drugs: Secondary | ICD-10-CM | POA: Diagnosis not present

## 2023-10-30 DIAGNOSIS — F1090 Alcohol use, unspecified, uncomplicated: Secondary | ICD-10-CM | POA: Diagnosis not present

## 2023-10-30 DIAGNOSIS — Z89021 Acquired absence of right finger(s): Secondary | ICD-10-CM | POA: Diagnosis not present

## 2023-10-30 DIAGNOSIS — Z7901 Long term (current) use of anticoagulants: Secondary | ICD-10-CM | POA: Diagnosis not present

## 2023-10-30 DIAGNOSIS — Z89022 Acquired absence of left finger(s): Secondary | ICD-10-CM | POA: Diagnosis not present

## 2023-10-30 DIAGNOSIS — I129 Hypertensive chronic kidney disease with stage 1 through stage 4 chronic kidney disease, or unspecified chronic kidney disease: Secondary | ICD-10-CM | POA: Diagnosis not present

## 2023-10-30 DIAGNOSIS — M810 Age-related osteoporosis without current pathological fracture: Secondary | ICD-10-CM | POA: Diagnosis not present

## 2023-10-30 DIAGNOSIS — N400 Enlarged prostate without lower urinary tract symptoms: Secondary | ICD-10-CM | POA: Diagnosis not present

## 2023-11-02 ENCOUNTER — Other Ambulatory Visit: Payer: Self-pay | Admitting: *Deleted

## 2023-11-02 ENCOUNTER — Ambulatory Visit (HOSPITAL_COMMUNITY)
Admission: RE | Admit: 2023-11-02 | Discharge: 2023-11-02 | Disposition: A | Discharge status: Home / Self Care | Payer: Medicare Other | Source: Ambulatory Visit | Attending: Vascular Surgery | Admitting: Vascular Surgery

## 2023-11-02 ENCOUNTER — Other Ambulatory Visit: Payer: Self-pay | Admitting: Vascular Surgery

## 2023-11-02 DIAGNOSIS — E782 Mixed hyperlipidemia: Secondary | ICD-10-CM | POA: Diagnosis not present

## 2023-11-02 DIAGNOSIS — I70261 Atherosclerosis of native arteries of extremities with gangrene, right leg: Secondary | ICD-10-CM

## 2023-11-02 DIAGNOSIS — E1165 Type 2 diabetes mellitus with hyperglycemia: Secondary | ICD-10-CM | POA: Diagnosis not present

## 2023-11-02 DIAGNOSIS — N1831 Chronic kidney disease, stage 3a: Secondary | ICD-10-CM | POA: Diagnosis not present

## 2023-11-02 DIAGNOSIS — E7801 Familial hypercholesterolemia: Secondary | ICD-10-CM | POA: Diagnosis not present

## 2023-11-02 DIAGNOSIS — I10 Essential (primary) hypertension: Secondary | ICD-10-CM | POA: Diagnosis not present

## 2023-11-02 DIAGNOSIS — E781 Pure hyperglyceridemia: Secondary | ICD-10-CM | POA: Diagnosis not present

## 2023-11-02 DIAGNOSIS — E7800 Pure hypercholesterolemia, unspecified: Secondary | ICD-10-CM | POA: Diagnosis not present

## 2023-11-02 DIAGNOSIS — E7849 Other hyperlipidemia: Secondary | ICD-10-CM | POA: Diagnosis not present

## 2023-11-04 DIAGNOSIS — L89892 Pressure ulcer of other site, stage 2: Secondary | ICD-10-CM | POA: Diagnosis not present

## 2023-11-04 DIAGNOSIS — N189 Chronic kidney disease, unspecified: Secondary | ICD-10-CM | POA: Diagnosis not present

## 2023-11-04 DIAGNOSIS — E1122 Type 2 diabetes mellitus with diabetic chronic kidney disease: Secondary | ICD-10-CM | POA: Diagnosis not present

## 2023-11-04 DIAGNOSIS — K5790 Diverticulosis of intestine, part unspecified, without perforation or abscess without bleeding: Secondary | ICD-10-CM | POA: Diagnosis not present

## 2023-11-04 DIAGNOSIS — Z4800 Encounter for change or removal of nonsurgical wound dressing: Secondary | ICD-10-CM | POA: Diagnosis not present

## 2023-11-04 DIAGNOSIS — I129 Hypertensive chronic kidney disease with stage 1 through stage 4 chronic kidney disease, or unspecified chronic kidney disease: Secondary | ICD-10-CM | POA: Diagnosis not present

## 2023-11-09 ENCOUNTER — Ambulatory Visit: Payer: Medicare Other | Attending: Cardiology | Admitting: *Deleted

## 2023-11-09 DIAGNOSIS — N401 Enlarged prostate with lower urinary tract symptoms: Secondary | ICD-10-CM | POA: Diagnosis not present

## 2023-11-09 DIAGNOSIS — I10 Essential (primary) hypertension: Secondary | ICD-10-CM | POA: Diagnosis not present

## 2023-11-09 DIAGNOSIS — I82603 Acute embolism and thrombosis of unspecified veins of upper extremity, bilateral: Secondary | ICD-10-CM | POA: Diagnosis not present

## 2023-11-09 DIAGNOSIS — Z9889 Other specified postprocedural states: Secondary | ICD-10-CM | POA: Insufficient documentation

## 2023-11-09 DIAGNOSIS — I4891 Unspecified atrial fibrillation: Secondary | ICD-10-CM | POA: Insufficient documentation

## 2023-11-09 DIAGNOSIS — E1140 Type 2 diabetes mellitus with diabetic neuropathy, unspecified: Secondary | ICD-10-CM | POA: Diagnosis not present

## 2023-11-09 DIAGNOSIS — M810 Age-related osteoporosis without current pathological fracture: Secondary | ICD-10-CM | POA: Diagnosis not present

## 2023-11-09 DIAGNOSIS — M1A9XX0 Chronic gout, unspecified, without tophus (tophi): Secondary | ICD-10-CM | POA: Diagnosis not present

## 2023-11-09 DIAGNOSIS — Z8679 Personal history of other diseases of the circulatory system: Secondary | ICD-10-CM | POA: Diagnosis not present

## 2023-11-09 DIAGNOSIS — R197 Diarrhea, unspecified: Secondary | ICD-10-CM | POA: Diagnosis not present

## 2023-11-09 DIAGNOSIS — I5033 Acute on chronic diastolic (congestive) heart failure: Secondary | ICD-10-CM | POA: Diagnosis not present

## 2023-11-09 DIAGNOSIS — Z23 Encounter for immunization: Secondary | ICD-10-CM | POA: Diagnosis not present

## 2023-11-09 DIAGNOSIS — Q2381 Bicuspid aortic valve: Secondary | ICD-10-CM | POA: Diagnosis not present

## 2023-11-09 DIAGNOSIS — I9789 Other postprocedural complications and disorders of the circulatory system, not elsewhere classified: Secondary | ICD-10-CM | POA: Insufficient documentation

## 2023-11-09 DIAGNOSIS — D649 Anemia, unspecified: Secondary | ICD-10-CM | POA: Diagnosis not present

## 2023-11-09 DIAGNOSIS — E7849 Other hyperlipidemia: Secondary | ICD-10-CM | POA: Diagnosis not present

## 2023-11-09 DIAGNOSIS — N1831 Chronic kidney disease, stage 3a: Secondary | ICD-10-CM | POA: Diagnosis not present

## 2023-11-09 LAB — POCT INR: INR: 2.4 (ref 2.0–3.0)

## 2023-11-09 NOTE — Patient Instructions (Signed)
Continue warfarin 1 tablet daily except 2 tablets on Mondays, Wednesdays and Fridays. Recheck in 6 wks

## 2023-11-11 DIAGNOSIS — E1122 Type 2 diabetes mellitus with diabetic chronic kidney disease: Secondary | ICD-10-CM | POA: Diagnosis not present

## 2023-11-11 DIAGNOSIS — K5790 Diverticulosis of intestine, part unspecified, without perforation or abscess without bleeding: Secondary | ICD-10-CM | POA: Diagnosis not present

## 2023-11-11 DIAGNOSIS — L89892 Pressure ulcer of other site, stage 2: Secondary | ICD-10-CM | POA: Diagnosis not present

## 2023-11-11 DIAGNOSIS — I129 Hypertensive chronic kidney disease with stage 1 through stage 4 chronic kidney disease, or unspecified chronic kidney disease: Secondary | ICD-10-CM | POA: Diagnosis not present

## 2023-11-11 DIAGNOSIS — N189 Chronic kidney disease, unspecified: Secondary | ICD-10-CM | POA: Diagnosis not present

## 2023-11-11 DIAGNOSIS — Z4800 Encounter for change or removal of nonsurgical wound dressing: Secondary | ICD-10-CM | POA: Diagnosis not present

## 2023-11-15 DIAGNOSIS — E876 Hypokalemia: Secondary | ICD-10-CM | POA: Diagnosis not present

## 2023-11-18 DIAGNOSIS — I129 Hypertensive chronic kidney disease with stage 1 through stage 4 chronic kidney disease, or unspecified chronic kidney disease: Secondary | ICD-10-CM | POA: Diagnosis not present

## 2023-11-18 DIAGNOSIS — L89892 Pressure ulcer of other site, stage 2: Secondary | ICD-10-CM | POA: Diagnosis not present

## 2023-11-18 DIAGNOSIS — Z4800 Encounter for change or removal of nonsurgical wound dressing: Secondary | ICD-10-CM | POA: Diagnosis not present

## 2023-11-18 DIAGNOSIS — E1122 Type 2 diabetes mellitus with diabetic chronic kidney disease: Secondary | ICD-10-CM | POA: Diagnosis not present

## 2023-11-18 DIAGNOSIS — K5790 Diverticulosis of intestine, part unspecified, without perforation or abscess without bleeding: Secondary | ICD-10-CM | POA: Diagnosis not present

## 2023-11-18 DIAGNOSIS — N189 Chronic kidney disease, unspecified: Secondary | ICD-10-CM | POA: Diagnosis not present

## 2023-11-25 DIAGNOSIS — Z4800 Encounter for change or removal of nonsurgical wound dressing: Secondary | ICD-10-CM | POA: Diagnosis not present

## 2023-11-25 DIAGNOSIS — L89892 Pressure ulcer of other site, stage 2: Secondary | ICD-10-CM | POA: Diagnosis not present

## 2023-11-25 DIAGNOSIS — E1122 Type 2 diabetes mellitus with diabetic chronic kidney disease: Secondary | ICD-10-CM | POA: Diagnosis not present

## 2023-11-25 DIAGNOSIS — N189 Chronic kidney disease, unspecified: Secondary | ICD-10-CM | POA: Diagnosis not present

## 2023-11-25 DIAGNOSIS — I129 Hypertensive chronic kidney disease with stage 1 through stage 4 chronic kidney disease, or unspecified chronic kidney disease: Secondary | ICD-10-CM | POA: Diagnosis not present

## 2023-11-25 DIAGNOSIS — K5790 Diverticulosis of intestine, part unspecified, without perforation or abscess without bleeding: Secondary | ICD-10-CM | POA: Diagnosis not present

## 2023-11-26 DIAGNOSIS — E7800 Pure hypercholesterolemia, unspecified: Secondary | ICD-10-CM | POA: Diagnosis not present

## 2023-11-26 DIAGNOSIS — Z7984 Long term (current) use of oral hypoglycemic drugs: Secondary | ICD-10-CM | POA: Diagnosis not present

## 2023-11-26 DIAGNOSIS — Z89512 Acquired absence of left leg below knee: Secondary | ICD-10-CM | POA: Diagnosis not present

## 2023-11-26 DIAGNOSIS — Z79899 Other long term (current) drug therapy: Secondary | ICD-10-CM | POA: Diagnosis not present

## 2023-11-26 DIAGNOSIS — M810 Age-related osteoporosis without current pathological fracture: Secondary | ICD-10-CM | POA: Diagnosis not present

## 2023-11-26 DIAGNOSIS — I10 Essential (primary) hypertension: Secondary | ICD-10-CM | POA: Insufficient documentation

## 2023-11-26 DIAGNOSIS — L89892 Pressure ulcer of other site, stage 2: Secondary | ICD-10-CM | POA: Diagnosis not present

## 2023-11-26 DIAGNOSIS — Z882 Allergy status to sulfonamides status: Secondary | ICD-10-CM | POA: Diagnosis not present

## 2023-11-26 DIAGNOSIS — I129 Hypertensive chronic kidney disease with stage 1 through stage 4 chronic kidney disease, or unspecified chronic kidney disease: Secondary | ICD-10-CM | POA: Diagnosis not present

## 2023-11-26 DIAGNOSIS — R011 Cardiac murmur, unspecified: Secondary | ICD-10-CM | POA: Insufficient documentation

## 2023-11-26 DIAGNOSIS — Z7729 Contact with and (suspected ) exposure to other hazardous substances: Secondary | ICD-10-CM | POA: Insufficient documentation

## 2023-11-26 DIAGNOSIS — E1122 Type 2 diabetes mellitus with diabetic chronic kidney disease: Secondary | ICD-10-CM | POA: Diagnosis not present

## 2023-11-26 DIAGNOSIS — I4891 Unspecified atrial fibrillation: Secondary | ICD-10-CM | POA: Diagnosis not present

## 2023-11-26 DIAGNOSIS — Z7982 Long term (current) use of aspirin: Secondary | ICD-10-CM | POA: Diagnosis not present

## 2023-11-26 DIAGNOSIS — N3281 Overactive bladder: Secondary | ICD-10-CM | POA: Insufficient documentation

## 2023-11-26 DIAGNOSIS — Z880 Allergy status to penicillin: Secondary | ICD-10-CM | POA: Diagnosis not present

## 2023-11-26 DIAGNOSIS — N189 Chronic kidney disease, unspecified: Secondary | ICD-10-CM | POA: Diagnosis not present

## 2023-11-26 DIAGNOSIS — B009 Herpesviral infection, unspecified: Secondary | ICD-10-CM | POA: Insufficient documentation

## 2023-11-29 DIAGNOSIS — K5790 Diverticulosis of intestine, part unspecified, without perforation or abscess without bleeding: Secondary | ICD-10-CM | POA: Diagnosis not present

## 2023-11-29 DIAGNOSIS — E1122 Type 2 diabetes mellitus with diabetic chronic kidney disease: Secondary | ICD-10-CM | POA: Diagnosis not present

## 2023-11-29 DIAGNOSIS — G2581 Restless legs syndrome: Secondary | ICD-10-CM | POA: Diagnosis not present

## 2023-11-29 DIAGNOSIS — M109 Gout, unspecified: Secondary | ICD-10-CM | POA: Diagnosis not present

## 2023-11-29 DIAGNOSIS — Z89022 Acquired absence of left finger(s): Secondary | ICD-10-CM | POA: Diagnosis not present

## 2023-11-29 DIAGNOSIS — Z89511 Acquired absence of right leg below knee: Secondary | ICD-10-CM | POA: Diagnosis not present

## 2023-11-29 DIAGNOSIS — Z7901 Long term (current) use of anticoagulants: Secondary | ICD-10-CM | POA: Diagnosis not present

## 2023-11-29 DIAGNOSIS — M810 Age-related osteoporosis without current pathological fracture: Secondary | ICD-10-CM | POA: Diagnosis not present

## 2023-11-29 DIAGNOSIS — L89892 Pressure ulcer of other site, stage 2: Secondary | ICD-10-CM | POA: Diagnosis not present

## 2023-11-29 DIAGNOSIS — I129 Hypertensive chronic kidney disease with stage 1 through stage 4 chronic kidney disease, or unspecified chronic kidney disease: Secondary | ICD-10-CM | POA: Diagnosis not present

## 2023-11-29 DIAGNOSIS — N529 Male erectile dysfunction, unspecified: Secondary | ICD-10-CM | POA: Diagnosis not present

## 2023-11-29 DIAGNOSIS — Z7984 Long term (current) use of oral hypoglycemic drugs: Secondary | ICD-10-CM | POA: Diagnosis not present

## 2023-11-29 DIAGNOSIS — E7800 Pure hypercholesterolemia, unspecified: Secondary | ICD-10-CM | POA: Diagnosis not present

## 2023-11-29 DIAGNOSIS — Z4800 Encounter for change or removal of nonsurgical wound dressing: Secondary | ICD-10-CM | POA: Diagnosis not present

## 2023-11-29 DIAGNOSIS — Z89021 Acquired absence of right finger(s): Secondary | ICD-10-CM | POA: Diagnosis not present

## 2023-11-29 DIAGNOSIS — Z89512 Acquired absence of left leg below knee: Secondary | ICD-10-CM | POA: Diagnosis not present

## 2023-11-29 DIAGNOSIS — I4891 Unspecified atrial fibrillation: Secondary | ICD-10-CM | POA: Diagnosis not present

## 2023-11-29 DIAGNOSIS — N400 Enlarged prostate without lower urinary tract symptoms: Secondary | ICD-10-CM | POA: Diagnosis not present

## 2023-11-29 DIAGNOSIS — F1090 Alcohol use, unspecified, uncomplicated: Secondary | ICD-10-CM | POA: Diagnosis not present

## 2023-11-29 DIAGNOSIS — M1990 Unspecified osteoarthritis, unspecified site: Secondary | ICD-10-CM | POA: Diagnosis not present

## 2023-11-29 DIAGNOSIS — B009 Herpesviral infection, unspecified: Secondary | ICD-10-CM | POA: Diagnosis not present

## 2023-11-29 DIAGNOSIS — N189 Chronic kidney disease, unspecified: Secondary | ICD-10-CM | POA: Diagnosis not present

## 2023-11-29 NOTE — Progress Notes (Deleted)
 Patient name: Scott [name] MRN: [id number] DOB: [birth year] Sex: male  REASON FOR CONSULT: Wound with weak left femoral pulse  HPI: Scott [name] is a 78 y.o. male, with history of coronary artery disease, hypertension, hyperlipidemia, A-fib, diabetes, bilateral BKA, status post CABG with mitral and aortic valve repair and ascending aortic aneurysm repair that presents for evaluation of weak left femoral pulse with a wound.  Patient did have lower extremity arterial duplex on 11/02/2023 with patent bilateral extremity arteries from the distal external iliac to the popliteal arteries without visualized stenosis.  Past Medical History:  Diagnosis Date   Arthritis    hips, shoulders; knees; back (10/20/2016)   Bicuspid aortic valve    BPH (benign prostatic hypertrophy)    Carpal tunnel syndrome of right wrist    Coronary artery disease involving native coronary artery of native heart with unstable angina pectoris (HCC) 10/20/2016   Diverticulitis    GERD (gastroesophageal reflux disease)    Gout    Heart murmur    Hyperlipemia    Hypertension    Postoperative atrial fibrillation (HCC) 10/24/2016   RLS (restless legs syndrome)    S/P aortic valve replacement with bioprosthetic valve 10/22/2016   25 mm [facility] Ease bovine pericardial bioprosthetic tissue valve   S/P ascending aortic aneurysm repair 10/22/2016   28 mm supracoronary straight graft replacement of ascending thoracic aortic aneurysm   S/P CABG x 3 10/22/2016   Sequential LIMA to Diag and LAD, SVG to distal LAD, open vein harvest right thigh   S/P mitral valve repair 10/22/2016   Artificial Gore-tex neochord placement x6 - posterior annuloplasty band placed but removed due to systolic anterior motion of mitral valve   Severe aortic stenosis    Snores    Never been tested for sleep apnea   Thoracic ascending aortic aneurysm (HCC) 10/21/2016   Thrombocytopenia (HCC) 10/22/2016   Type II diabetes mellitus (HCC)      Past Surgical History:  Procedure Laterality Date   AMPUTATION Bilateral 12/11/2016   Procedure: AMPUTATION BELOW KNEE BILATERALLY;  Surgeon: Jerona Harden GAILS, MD;  Location: [facility] OR;  Service: Orthopedics;  Laterality: Bilateral;   AMPUTATION Bilateral 12/11/2016   Procedure: AMPUTATION BILATERAL HANDS EXCEPT RIGHT THUMB;  Surgeon: Jerona Harden GAILS, MD;  Location: [facility] OR;  Service: Orthopedics;  Laterality: Bilateral;   AORTIC VALVE REPLACEMENT N/A 10/22/2016   Procedure: AORTIC VALVE REPLACEMENT (AVR) WITH SIZE 25 MM MAGNA EASE PERICARDIAL BIOPROSTHESIS - AORTIC;  Surgeon: Sudie VEAR Laine, MD;  Location: [facility] OR;  Service: Open Heart Surgery;  Laterality: N/A;   BONE EXCISION Right 02/01/2017   Procedure: EXCISION RIGHT INDEX METACARPAL HEAD;  Surgeon: Jerona Harden GAILS, MD;  Location: [facility] OR;  Service: Orthopedics;  Laterality: Right;   CARDIAC CATHETERIZATION N/A 10/20/2016   Procedure: Right/Left Heart Cath and Coronary Angiography;  Surgeon: Alm LELON Clay, MD;  Location: [facility] INVASIVE CV LAB;  Service: Cardiovascular;  Laterality: N/A;   CARPAL TUNNEL RELEASE Right 11/28/2013   Procedure: RIGHT WRIST CARPAL TUNNEL RELEASE;  Surgeon: Lamar DELENA Millman, MD;  Location: [facility];  Service: Orthopedics;  Laterality: Right;   CATARACT EXTRACTION W/ INTRAOCULAR LENS  IMPLANT, BILATERAL Bilateral 1978   COLONOSCOPY     CORONARY ARTERY BYPASS GRAFT N/A 10/22/2016   Procedure: CORONARY ARTERY BYPASS GRAFTING (CABG)x 2 WITH LIMA TO DIAGONAL, OPEN  HARVESTING OF RIGHT SAPHENOUS VEIN FOR VEIN GRAFT TO LAD;  Surgeon: Sudie VEAR Laine, MD;  Location: [facility];  Service: Open Heart Surgery;  Laterality: N/A;   FRACTURE SURGERY     INGUINAL HERNIA REPAIR Right 1998   IR GENERIC HISTORICAL  12/07/2016   IR US  GUIDE VASC ACCESS RIGHT 12/07/2016 Marcey Moan, MD [facility]   IR GENERIC HISTORICAL  12/07/2016   IR RADIOLOGY PERIPHERAL GUIDED IV START 12/07/2016 Marcey Moan, MD [facility]   IR GENERIC  HISTORICAL  12/07/2016   IR GASTROSTOMY TUBE MOD SED 12/07/2016 Marcey Moan, MD [facility]   LIPOMA EXCISION Right 2008   side of my head   MITRAL VALVE REPAIR N/A 10/22/2016   Procedure: MITRAL VALVE REPAIR (MVR) WITH SIZE 30 SORIN ANNULOFLEX ANNULOPLASTY RING WITH SUBSEQUENT REMOVAL OF RING;  Surgeon: Sudie VEAR Laine, MD;  Location: [facility];  Service: Open Heart Surgery;  Laterality: N/A;   PENECTOMY  2007   Peyronie's disease    PENILE PROSTHESIS IMPLANT  2009   REMOVAL OF PENILE PROSTHESIS N/A 02/01/2017   Procedure: REMOVAL OF PENILE PROSTHESIS;  Surgeon: Mark Ottelin, MD;  Location[facility];  Service: Urology;  Laterality: N/A;   SHOULDER OPEN ROTATOR CUFF REPAIR Right 2006   STERNAL CLOSURE N/A 10/26/2016   Procedure: STERNAL WASHOUT AND DELAYED PRIMARY CLOSURE;  Surgeon: Sudie VEAR Laine, MD;  Location: [facility];  Service: Thoracic;  Laterality: N/A;   TEE WITHOUT CARDIOVERSION N/A 10/26/2016   Procedure: TRANSESOPHAGEAL ECHOCARDIOGRAM (TEE);  Surgeon: Sudie VEAR Laine, MD;  Location: [facility] OR;  Service: Thoracic;  Laterality: N/A;   TEE WITHOUT CARDIOVERSION N/A 10/22/2016   Procedure: TRANSESOPHAGEAL ECHOCARDIOGRAM (TEE);  Surgeon: Sudie VEAR Laine, MD;  Location: [facility] OR;  Service: Open Heart Surgery;  Laterality: N/A;   THORACIC AORTIC ANEURYSM REPAIR  10/22/2016   Procedure: ASCENDING AORTIC  ANEURYSM REPAIR (AAA) WITH 28 MM HEMASHIELD PLATINUM WOVEN DOUBLE VELOUR VASCULAR GRAFT;  Surgeon: Sudie VEAR Laine, MD;  Location: [facility];  Service: Open Heart Surgery;;   TONSILLECTOMY  ~ 1955   TRACHEOSTOMY TUBE PLACEMENT N/A 11/09/2016   Procedure: TRACHEOSTOMY;  Surgeon: Sudie VEAR Laine, MD;  Location: [facility];  Service: Thoracic;  Laterality: N/A;   TRANSURETHRAL RESECTION OF PROSTATE  2005   TRIGGER FINGER RELEASE Bilateral    several lt and rt hands   TRIGGER FINGER RELEASE Right 11/28/2013   Procedure: RIGHT TRIGGER FINGER  RELEASE (TENDON SHEATH INCISION);  Surgeon: Lamar DELENA Millman, MD;   Location: [facility];  Service: Orthopedics;  Laterality: Right;   VIDEO BRONCHOSCOPY N/A 11/09/2016   Procedure: VIDEO BRONCHOSCOPY;  Surgeon: Sudie VEAR Laine, MD;  Location: [facility] OR;  Service: Thoracic;  Laterality: N/A;   WRIST FRACTURE SURGERY Right ~ 1959    Family History  Problem Relation Age of Onset   Lung cancer Mother    Clotting disorder Father        No details   Heart disease Sister 25       Stents   Cancer Sister        Throat    SOCIAL HISTORY: Social History   Socioeconomic History   Marital status: Married    Spouse name: Not on file   Number of children: Not on file   Years of education: Not on file   Highest education level: Not on file  Occupational History   Occupation: mait[employer]     Employer: [employer]    Comment: retired  Tobacco Use   Smoking status: Former    Types: Pipe, Software Engineer    Quit date: 1984  Years since quitting: 41.0   Smokeless tobacco: Never  Vaping Use   Vaping status: Never Used  Substance and Sexual Activity   Alcohol  use: Yes    Alcohol /week: 10.0 standard drinks of alcohol     Types: 10 Cans of beer per week   Drug use: No   Sexual activity: Not Currently  Other Topics Concern   Not on file  Social History Narrative   Not on file   Social Drivers of Health   Financial Resource Strain: Not on file  Food Insecurity: Not on file  Transportation Needs: Not on file  Physical Activity: Not on file  Stress: Not on file  Social Connections: Not on file  Intimate Partner Violence: Not on file    Allergies  Allergen Reactions   Sulfa Antibiotics Hives    Current Outpatient Medications  Medication Sig Dispense Refill   allopurinol  (ZYLOPRIM ) 300 MG tablet Take 300 mg by mouth daily.     aspirin  EC 81 MG tablet Take 81 mg by mouth daily.     atorvastatin  (LIPITOR) 40 MG tablet TAKE 1 TABLET DAILY AT 6PM 90 tablet 3   KLOR-CON  M20 20 MEQ tablet TAKE 1 TABLET TWICE A DAY (Patient taking  differently: Take 20 mEq by mouth 3 (three) times daily.) 180 tablet 3   Lactobacillus (PROBIOTIC ACIDOPHILUS PO) Take 1 capsule by mouth daily.     lisinopril  (ZESTRIL ) 2.5 MG tablet TAKE 1 TABLET DAILY 90 tablet 3   Magnesium  Oxide (MAG-OX 400 PO) Take 400 mg by mouth 2 (two) times daily.     Melatonin 10 MG CAPS Take 10 mg by mouth at bedtime.      metFORMIN  (GLUCOPHAGE ) 500 MG tablet Take 1 tablet (500 mg total) by mouth in the morning. Take 2 tablets (1000 mg) every evening.     metolazone  (ZAROXOLYN ) 2.5 MG tablet TAKE 1 TABLET (2.5 MG TOTAL) BY MOUTH EVERY WEDNESDAY. 12 tablet 1   metoprolol  succinate (TOPROL -XL) 100 MG 24 hr tablet Take 1 tablet (100 mg total) by mouth 2 (two) times daily. 180 tablet 3   MISC NATURAL PRODUCTS PO Take 1 tablet by mouth 2 (two) times daily. Sucontral D - natural product for healthy blood sugar levels     nitroGLYCERIN  (NITROSTAT ) 0.4 MG SL tablet Place 1 tablet (0.4 mg total) under the tongue every 5 (five) minutes x 3 doses as needed (If no relief after 3rd dose, go to ED.). 25 tablet 3   rOPINIRole  (REQUIP ) 2 MG tablet Take 2 mg by mouth See admin instructions. Takes 2mg  at 4:30pm and 2mg  at 9:30pm.     tamsulosin (FLOMAX) 0.4 MG CAPS capsule Take 0.4 mg by mouth daily.     torsemide  (DEMADEX ) 20 MG tablet TAKE 4 TABLETS (80 MG TOTAL) BY MOUTH 2 (TWO) TIMES DAILY. 720 tablet 1   warfarin (COUMADIN ) 2 MG tablet TAKE 1 TO  2 TABLETS BY MOUTH DAILY OR AS DIRECTED 180 tablet 1   No current facility-administered medications for this visit.    REVIEW OF SYSTEMS:  [X]  denotes positive finding, [ ]  denotes negative finding Cardiac  Comments:  Chest pain or chest pressure: ***   Shortness of breath upon exertion:    Short of breath when lying flat:    Irregular heart rhythm:        Vascular    Pain in calf, thigh, or hip brought on by ambulation:    Pain in feet at night that wakes you up from your  sleep:     Blood clot in your veins:    Leg swelling:          Pulmonary    Oxygen at home:    Productive cough:     Wheezing:         Neurologic    Sudden weakness in arms or legs:     Sudden numbness in arms or legs:     Sudden onset of difficulty speaking or slurred speech:    Temporary loss of vision in one eye:     Problems with dizziness:         Gastrointestinal    Blood in stool:     Vomited blood:         Genitourinary    Burning when urinating:     Blood in urine:        Psychiatric    Major depression:         Hematologic    Bleeding problems:    Problems with blood clotting too easily:        Skin    Rashes or ulcers:        Constitutional    Fever or chills:      PHYSICAL EXAM: There were no vitals filed for this visit.  GENERAL: The patient is a well-nourished male, in no acute distress. The vital signs are documented above. CARDIAC: There is a regular rate and rhythm.  VASCULAR: *** PULMONARY: There is good air exchange bilaterally without wheezing or rales. ABDOMEN: Soft and non-tender with normal pitched bowel sounds.  MUSCULOSKELETAL: There are no major deformities or cyanosis. NEUROLOGIC: No focal weakness or paresthesias are detected. SKIN: There are no ulcers or rashes noted. PSYCHIATRIC: The patient has a normal affect.  DATA:   LOWER EXTREMITY ARTERIAL DUPLEX STUDY  Patient Name:  HARPREET POMPEY  Date of Exam:   11/02/2023 Medical Rec #: [id number]         Accession #:    [id number] Date of Birth: 10-13-1946         Patient Gender: M Patient Age:   33 years Exam Location:  [facility] Procedure:      VAS US  LOWER EXTREMITY ARTERIAL DUPLEX Referring Phys: LONNI GASKINS   --------------------------------------------------------------------------- -----   Indications: Peripheral artery disease.  High Risk Factors: Hypertension, coronary artery disease.    Vascular Interventions: Amputation of both hands                         Amputation below knee bilaterally  12/11/2016. Current ABI:            N/A  Performing Technologist: Devere Dark RVT    Examination Guidelines: A complete evaluation includes B-mode imaging, spectral Doppler, color Doppler, and power Doppler as needed of all accessible portions of each vessel. Bilateral testing is considered an integral part of a complete examination. Limited examinations for reoccurring indications may be performed as noted.      +----------+--------+-----+--------+---------+----------+ RIGHT     PSV cm/sRatioStenosisWaveform Comments   +----------+--------+-----+--------+---------+----------+ EIA Distal50                   triphasic           +----------+--------+-----+--------+---------+----------+ CFA Prox  69                   biphasic            +----------+--------+-----+--------+---------+----------+ CFA Distal45  triphasic           +----------+--------+-----+--------+---------+----------+ DFA       36                   biphasic            +----------+--------+-----+--------+---------+----------+ SFA Prox  55                   biphasic            +----------+--------+-----+--------+---------+----------+ SFA Mid   68                   biphasic            +----------+--------+-----+--------+---------+----------+ SFA Distal57                   triphasic           +----------+--------+-----+--------+---------+----------+ POP Prox  59                   biphasic            +----------+--------+-----+--------+---------+----------+ POP Mid   21                   biphasic            +----------+--------+-----+--------+---------+----------+ POP Distal                              amputation +----------+--------+-----+--------+---------+----------+       +----------+--------+-----+--------+---------+----------+ LEFT      PSV cm/sRatioStenosisWaveform Comments    +----------+--------+-----+--------+---------+----------+ EIA Distal78                   triphasic           +----------+--------+-----+--------+---------+----------+ CFA Prox  70                   triphasic           +----------+--------+-----+--------+---------+----------+ DFA       49                   triphasic           +----------+--------+-----+--------+---------+----------+ SFA Prox  48                   biphasic            +----------+--------+-----+--------+---------+----------+ SFA Mid   56                   triphasic           +----------+--------+-----+--------+---------+----------+ SFA Distal42                   triphasic           +----------+--------+-----+--------+---------+----------+ POP Prox  26                   biphasic            +----------+--------+-----+--------+---------+----------+ POP Mid                                 amputation +----------+--------+-----+--------+---------+----------+   Summary: Patent bilateral lower extremities from the distal external iliac arteries to the popliteal arteries without visualized stenosis.    See table(s) above for measurements and observations.   Electronically signed by Lonni Gaskins MD on 11/02/2023 at 4:33:58 PM.  Assessment/Plan:  78 y.o. male, with history of coronary artery disease, hypertension, hyperlipidemia, A-fib, diabetes status post CABG with mitral and aortic valve and ascending aortic aneurysm repair the presents for evaluation of weak left femoral pulse with a wound.  Patient did have lower extremity arterial duplex on 11/02/2023 with patent bilateral extremity arteries from the distal external iliac to the popliteal arteries without visualized stenosis.   Lonni DOROTHA Gaskins, MD [facility] Office: [phone]

## 2023-11-30 ENCOUNTER — Encounter: Payer: Medicare Other | Admitting: Vascular Surgery

## 2023-12-01 DIAGNOSIS — N189 Chronic kidney disease, unspecified: Secondary | ICD-10-CM | POA: Diagnosis not present

## 2023-12-01 DIAGNOSIS — E1122 Type 2 diabetes mellitus with diabetic chronic kidney disease: Secondary | ICD-10-CM | POA: Diagnosis not present

## 2023-12-01 DIAGNOSIS — K5790 Diverticulosis of intestine, part unspecified, without perforation or abscess without bleeding: Secondary | ICD-10-CM | POA: Diagnosis not present

## 2023-12-01 DIAGNOSIS — I129 Hypertensive chronic kidney disease with stage 1 through stage 4 chronic kidney disease, or unspecified chronic kidney disease: Secondary | ICD-10-CM | POA: Diagnosis not present

## 2023-12-01 DIAGNOSIS — Z4800 Encounter for change or removal of nonsurgical wound dressing: Secondary | ICD-10-CM | POA: Diagnosis not present

## 2023-12-01 DIAGNOSIS — L89892 Pressure ulcer of other site, stage 2: Secondary | ICD-10-CM | POA: Diagnosis not present

## 2023-12-01 DIAGNOSIS — E876 Hypokalemia: Secondary | ICD-10-CM | POA: Diagnosis not present

## 2023-12-09 DIAGNOSIS — I129 Hypertensive chronic kidney disease with stage 1 through stage 4 chronic kidney disease, or unspecified chronic kidney disease: Secondary | ICD-10-CM | POA: Diagnosis not present

## 2023-12-09 DIAGNOSIS — K5790 Diverticulosis of intestine, part unspecified, without perforation or abscess without bleeding: Secondary | ICD-10-CM | POA: Diagnosis not present

## 2023-12-09 DIAGNOSIS — Z4800 Encounter for change or removal of nonsurgical wound dressing: Secondary | ICD-10-CM | POA: Diagnosis not present

## 2023-12-09 DIAGNOSIS — N189 Chronic kidney disease, unspecified: Secondary | ICD-10-CM | POA: Diagnosis not present

## 2023-12-09 DIAGNOSIS — L89892 Pressure ulcer of other site, stage 2: Secondary | ICD-10-CM | POA: Diagnosis not present

## 2023-12-09 DIAGNOSIS — E1122 Type 2 diabetes mellitus with diabetic chronic kidney disease: Secondary | ICD-10-CM | POA: Diagnosis not present

## 2023-12-15 DIAGNOSIS — Z4800 Encounter for change or removal of nonsurgical wound dressing: Secondary | ICD-10-CM | POA: Diagnosis not present

## 2023-12-15 DIAGNOSIS — E1122 Type 2 diabetes mellitus with diabetic chronic kidney disease: Secondary | ICD-10-CM | POA: Diagnosis not present

## 2023-12-15 DIAGNOSIS — L89892 Pressure ulcer of other site, stage 2: Secondary | ICD-10-CM | POA: Diagnosis not present

## 2023-12-15 DIAGNOSIS — N189 Chronic kidney disease, unspecified: Secondary | ICD-10-CM | POA: Diagnosis not present

## 2023-12-15 DIAGNOSIS — K5790 Diverticulosis of intestine, part unspecified, without perforation or abscess without bleeding: Secondary | ICD-10-CM | POA: Diagnosis not present

## 2023-12-15 DIAGNOSIS — I129 Hypertensive chronic kidney disease with stage 1 through stage 4 chronic kidney disease, or unspecified chronic kidney disease: Secondary | ICD-10-CM | POA: Diagnosis not present

## 2023-12-21 ENCOUNTER — Ambulatory Visit: Payer: Medicare Other | Attending: Cardiology | Admitting: *Deleted

## 2023-12-21 DIAGNOSIS — I82603 Acute embolism and thrombosis of unspecified veins of upper extremity, bilateral: Secondary | ICD-10-CM

## 2023-12-21 DIAGNOSIS — I9789 Other postprocedural complications and disorders of the circulatory system, not elsewhere classified: Secondary | ICD-10-CM

## 2023-12-21 DIAGNOSIS — Z9889 Other specified postprocedural states: Secondary | ICD-10-CM | POA: Diagnosis not present

## 2023-12-21 DIAGNOSIS — Q2381 Bicuspid aortic valve: Secondary | ICD-10-CM | POA: Diagnosis not present

## 2023-12-21 DIAGNOSIS — I4891 Unspecified atrial fibrillation: Secondary | ICD-10-CM | POA: Diagnosis not present

## 2023-12-21 LAB — POCT INR: INR: 2 (ref 2.0–3.0)

## 2023-12-21 NOTE — Patient Instructions (Signed)
Continue warfarin 1 tablet daily except 2 tablets on Mondays, Wednesdays and Fridays. Recheck in 6 wks

## 2023-12-24 DIAGNOSIS — E1122 Type 2 diabetes mellitus with diabetic chronic kidney disease: Secondary | ICD-10-CM | POA: Diagnosis not present

## 2023-12-24 DIAGNOSIS — I129 Hypertensive chronic kidney disease with stage 1 through stage 4 chronic kidney disease, or unspecified chronic kidney disease: Secondary | ICD-10-CM | POA: Diagnosis not present

## 2023-12-24 DIAGNOSIS — K5790 Diverticulosis of intestine, part unspecified, without perforation or abscess without bleeding: Secondary | ICD-10-CM | POA: Diagnosis not present

## 2023-12-24 DIAGNOSIS — N189 Chronic kidney disease, unspecified: Secondary | ICD-10-CM | POA: Diagnosis not present

## 2023-12-24 DIAGNOSIS — L89892 Pressure ulcer of other site, stage 2: Secondary | ICD-10-CM | POA: Diagnosis not present

## 2023-12-24 DIAGNOSIS — Z4800 Encounter for change or removal of nonsurgical wound dressing: Secondary | ICD-10-CM | POA: Diagnosis not present

## 2023-12-27 DIAGNOSIS — L89893 Pressure ulcer of other site, stage 3: Secondary | ICD-10-CM | POA: Diagnosis not present

## 2023-12-28 ENCOUNTER — Encounter: Payer: Medicare Other | Admitting: Vascular Surgery

## 2023-12-29 DIAGNOSIS — M109 Gout, unspecified: Secondary | ICD-10-CM | POA: Diagnosis not present

## 2023-12-29 DIAGNOSIS — Z7984 Long term (current) use of oral hypoglycemic drugs: Secondary | ICD-10-CM | POA: Diagnosis not present

## 2023-12-29 DIAGNOSIS — B009 Herpesviral infection, unspecified: Secondary | ICD-10-CM | POA: Diagnosis not present

## 2023-12-29 DIAGNOSIS — N400 Enlarged prostate without lower urinary tract symptoms: Secondary | ICD-10-CM | POA: Diagnosis not present

## 2023-12-29 DIAGNOSIS — E1122 Type 2 diabetes mellitus with diabetic chronic kidney disease: Secondary | ICD-10-CM | POA: Diagnosis not present

## 2023-12-29 DIAGNOSIS — M1990 Unspecified osteoarthritis, unspecified site: Secondary | ICD-10-CM | POA: Diagnosis not present

## 2023-12-29 DIAGNOSIS — L89892 Pressure ulcer of other site, stage 2: Secondary | ICD-10-CM | POA: Diagnosis not present

## 2023-12-29 DIAGNOSIS — K5790 Diverticulosis of intestine, part unspecified, without perforation or abscess without bleeding: Secondary | ICD-10-CM | POA: Diagnosis not present

## 2023-12-29 DIAGNOSIS — N529 Male erectile dysfunction, unspecified: Secondary | ICD-10-CM | POA: Diagnosis not present

## 2023-12-29 DIAGNOSIS — I4891 Unspecified atrial fibrillation: Secondary | ICD-10-CM | POA: Diagnosis not present

## 2023-12-29 DIAGNOSIS — Z89021 Acquired absence of right finger(s): Secondary | ICD-10-CM | POA: Diagnosis not present

## 2023-12-29 DIAGNOSIS — Z89022 Acquired absence of left finger(s): Secondary | ICD-10-CM | POA: Diagnosis not present

## 2023-12-29 DIAGNOSIS — Z4800 Encounter for change or removal of nonsurgical wound dressing: Secondary | ICD-10-CM | POA: Diagnosis not present

## 2023-12-29 DIAGNOSIS — I129 Hypertensive chronic kidney disease with stage 1 through stage 4 chronic kidney disease, or unspecified chronic kidney disease: Secondary | ICD-10-CM | POA: Diagnosis not present

## 2023-12-29 DIAGNOSIS — N189 Chronic kidney disease, unspecified: Secondary | ICD-10-CM | POA: Diagnosis not present

## 2023-12-29 DIAGNOSIS — G2581 Restless legs syndrome: Secondary | ICD-10-CM | POA: Diagnosis not present

## 2023-12-29 DIAGNOSIS — Z7901 Long term (current) use of anticoagulants: Secondary | ICD-10-CM | POA: Diagnosis not present

## 2023-12-29 DIAGNOSIS — Z89512 Acquired absence of left leg below knee: Secondary | ICD-10-CM | POA: Diagnosis not present

## 2023-12-29 DIAGNOSIS — Z89511 Acquired absence of right leg below knee: Secondary | ICD-10-CM | POA: Diagnosis not present

## 2023-12-29 DIAGNOSIS — M810 Age-related osteoporosis without current pathological fracture: Secondary | ICD-10-CM | POA: Diagnosis not present

## 2023-12-29 DIAGNOSIS — E7800 Pure hypercholesterolemia, unspecified: Secondary | ICD-10-CM | POA: Diagnosis not present

## 2023-12-29 DIAGNOSIS — F1090 Alcohol use, unspecified, uncomplicated: Secondary | ICD-10-CM | POA: Diagnosis not present

## 2023-12-30 DIAGNOSIS — N189 Chronic kidney disease, unspecified: Secondary | ICD-10-CM | POA: Diagnosis not present

## 2023-12-30 DIAGNOSIS — K5790 Diverticulosis of intestine, part unspecified, without perforation or abscess without bleeding: Secondary | ICD-10-CM | POA: Diagnosis not present

## 2023-12-30 DIAGNOSIS — E1122 Type 2 diabetes mellitus with diabetic chronic kidney disease: Secondary | ICD-10-CM | POA: Diagnosis not present

## 2023-12-30 DIAGNOSIS — L89892 Pressure ulcer of other site, stage 2: Secondary | ICD-10-CM | POA: Diagnosis not present

## 2023-12-30 DIAGNOSIS — Z4800 Encounter for change or removal of nonsurgical wound dressing: Secondary | ICD-10-CM | POA: Diagnosis not present

## 2023-12-30 DIAGNOSIS — I129 Hypertensive chronic kidney disease with stage 1 through stage 4 chronic kidney disease, or unspecified chronic kidney disease: Secondary | ICD-10-CM | POA: Diagnosis not present

## 2024-01-03 DIAGNOSIS — L89893 Pressure ulcer of other site, stage 3: Secondary | ICD-10-CM | POA: Diagnosis not present

## 2024-01-06 DIAGNOSIS — E1122 Type 2 diabetes mellitus with diabetic chronic kidney disease: Secondary | ICD-10-CM | POA: Diagnosis not present

## 2024-01-06 DIAGNOSIS — N189 Chronic kidney disease, unspecified: Secondary | ICD-10-CM | POA: Diagnosis not present

## 2024-01-06 DIAGNOSIS — K5790 Diverticulosis of intestine, part unspecified, without perforation or abscess without bleeding: Secondary | ICD-10-CM | POA: Diagnosis not present

## 2024-01-06 DIAGNOSIS — I129 Hypertensive chronic kidney disease with stage 1 through stage 4 chronic kidney disease, or unspecified chronic kidney disease: Secondary | ICD-10-CM | POA: Diagnosis not present

## 2024-01-06 DIAGNOSIS — Z4800 Encounter for change or removal of nonsurgical wound dressing: Secondary | ICD-10-CM | POA: Diagnosis not present

## 2024-01-06 DIAGNOSIS — L89892 Pressure ulcer of other site, stage 2: Secondary | ICD-10-CM | POA: Diagnosis not present

## 2024-01-10 DIAGNOSIS — L89893 Pressure ulcer of other site, stage 3: Secondary | ICD-10-CM | POA: Diagnosis not present

## 2024-01-11 DIAGNOSIS — M109 Gout, unspecified: Secondary | ICD-10-CM | POA: Diagnosis not present

## 2024-01-11 DIAGNOSIS — E7800 Pure hypercholesterolemia, unspecified: Secondary | ICD-10-CM | POA: Diagnosis not present

## 2024-01-11 DIAGNOSIS — N189 Chronic kidney disease, unspecified: Secondary | ICD-10-CM | POA: Diagnosis not present

## 2024-01-11 DIAGNOSIS — Z7982 Long term (current) use of aspirin: Secondary | ICD-10-CM | POA: Diagnosis not present

## 2024-01-11 DIAGNOSIS — Z79899 Other long term (current) drug therapy: Secondary | ICD-10-CM | POA: Diagnosis not present

## 2024-01-11 DIAGNOSIS — I129 Hypertensive chronic kidney disease with stage 1 through stage 4 chronic kidney disease, or unspecified chronic kidney disease: Secondary | ICD-10-CM | POA: Diagnosis not present

## 2024-01-11 DIAGNOSIS — I4891 Unspecified atrial fibrillation: Secondary | ICD-10-CM | POA: Diagnosis not present

## 2024-01-11 DIAGNOSIS — Z7984 Long term (current) use of oral hypoglycemic drugs: Secondary | ICD-10-CM | POA: Diagnosis not present

## 2024-01-11 DIAGNOSIS — L89892 Pressure ulcer of other site, stage 2: Secondary | ICD-10-CM | POA: Diagnosis not present

## 2024-01-11 DIAGNOSIS — E1122 Type 2 diabetes mellitus with diabetic chronic kidney disease: Secondary | ICD-10-CM | POA: Diagnosis not present

## 2024-01-13 DIAGNOSIS — N189 Chronic kidney disease, unspecified: Secondary | ICD-10-CM | POA: Diagnosis not present

## 2024-01-13 DIAGNOSIS — I129 Hypertensive chronic kidney disease with stage 1 through stage 4 chronic kidney disease, or unspecified chronic kidney disease: Secondary | ICD-10-CM | POA: Diagnosis not present

## 2024-01-13 DIAGNOSIS — K5790 Diverticulosis of intestine, part unspecified, without perforation or abscess without bleeding: Secondary | ICD-10-CM | POA: Diagnosis not present

## 2024-01-13 DIAGNOSIS — L89892 Pressure ulcer of other site, stage 2: Secondary | ICD-10-CM | POA: Diagnosis not present

## 2024-01-13 DIAGNOSIS — E1122 Type 2 diabetes mellitus with diabetic chronic kidney disease: Secondary | ICD-10-CM | POA: Diagnosis not present

## 2024-01-13 DIAGNOSIS — Z4800 Encounter for change or removal of nonsurgical wound dressing: Secondary | ICD-10-CM | POA: Diagnosis not present

## 2024-01-15 ENCOUNTER — Other Ambulatory Visit: Payer: Self-pay | Admitting: Cardiology

## 2024-01-17 DIAGNOSIS — L89893 Pressure ulcer of other site, stage 3: Secondary | ICD-10-CM | POA: Diagnosis not present

## 2024-01-20 DIAGNOSIS — L89892 Pressure ulcer of other site, stage 2: Secondary | ICD-10-CM | POA: Diagnosis not present

## 2024-01-20 DIAGNOSIS — N189 Chronic kidney disease, unspecified: Secondary | ICD-10-CM | POA: Diagnosis not present

## 2024-01-20 DIAGNOSIS — K5790 Diverticulosis of intestine, part unspecified, without perforation or abscess without bleeding: Secondary | ICD-10-CM | POA: Diagnosis not present

## 2024-01-20 DIAGNOSIS — E1122 Type 2 diabetes mellitus with diabetic chronic kidney disease: Secondary | ICD-10-CM | POA: Diagnosis not present

## 2024-01-20 DIAGNOSIS — Z4800 Encounter for change or removal of nonsurgical wound dressing: Secondary | ICD-10-CM | POA: Diagnosis not present

## 2024-01-20 DIAGNOSIS — I129 Hypertensive chronic kidney disease with stage 1 through stage 4 chronic kidney disease, or unspecified chronic kidney disease: Secondary | ICD-10-CM | POA: Diagnosis not present

## 2024-01-21 DIAGNOSIS — E782 Mixed hyperlipidemia: Secondary | ICD-10-CM | POA: Diagnosis not present

## 2024-01-21 DIAGNOSIS — I10 Essential (primary) hypertension: Secondary | ICD-10-CM | POA: Diagnosis not present

## 2024-01-21 DIAGNOSIS — N400 Enlarged prostate without lower urinary tract symptoms: Secondary | ICD-10-CM | POA: Diagnosis not present

## 2024-01-21 DIAGNOSIS — E1140 Type 2 diabetes mellitus with diabetic neuropathy, unspecified: Secondary | ICD-10-CM | POA: Diagnosis not present

## 2024-01-24 DIAGNOSIS — L89893 Pressure ulcer of other site, stage 3: Secondary | ICD-10-CM | POA: Diagnosis not present

## 2024-01-25 ENCOUNTER — Other Ambulatory Visit: Payer: Self-pay | Admitting: Cardiology

## 2024-01-25 NOTE — Telephone Encounter (Signed)
 Refill request for warfarin:  Last INR was 2.0 on 12/21/23 Next INR due 01/27/24 LOV was 06/09/23  Refill approved.

## 2024-01-26 ENCOUNTER — Ambulatory Visit: Payer: Medicare Other | Attending: Cardiology | Admitting: *Deleted

## 2024-01-26 DIAGNOSIS — Z9889 Other specified postprocedural states: Secondary | ICD-10-CM | POA: Insufficient documentation

## 2024-01-26 DIAGNOSIS — Q2381 Bicuspid aortic valve: Secondary | ICD-10-CM | POA: Insufficient documentation

## 2024-01-26 DIAGNOSIS — I4891 Unspecified atrial fibrillation: Secondary | ICD-10-CM | POA: Diagnosis not present

## 2024-01-26 DIAGNOSIS — I9789 Other postprocedural complications and disorders of the circulatory system, not elsewhere classified: Secondary | ICD-10-CM | POA: Diagnosis not present

## 2024-01-26 DIAGNOSIS — I82603 Acute embolism and thrombosis of unspecified veins of upper extremity, bilateral: Secondary | ICD-10-CM | POA: Insufficient documentation

## 2024-01-26 LAB — POCT INR: INR: 1.9 — AB (ref 2.0–3.0)

## 2024-01-26 NOTE — Patient Instructions (Signed)
 Increase warfarin to 2 tablets daily except 1 tablet on Sundays, Tuesdays and Thursdays Recheck in 6 wks

## 2024-01-27 ENCOUNTER — Encounter: Payer: Self-pay | Admitting: Cardiology

## 2024-01-27 ENCOUNTER — Ambulatory Visit: Payer: Medicare Other | Attending: Cardiology | Admitting: Cardiology

## 2024-01-27 VITALS — BP 100/68 | HR 71 | Ht 69.0 in | Wt 148.6 lb

## 2024-01-27 DIAGNOSIS — I2510 Atherosclerotic heart disease of native coronary artery without angina pectoris: Secondary | ICD-10-CM | POA: Insufficient documentation

## 2024-01-27 DIAGNOSIS — Z79899 Other long term (current) drug therapy: Secondary | ICD-10-CM | POA: Diagnosis not present

## 2024-01-27 DIAGNOSIS — I5032 Chronic diastolic (congestive) heart failure: Secondary | ICD-10-CM | POA: Insufficient documentation

## 2024-01-27 DIAGNOSIS — I129 Hypertensive chronic kidney disease with stage 1 through stage 4 chronic kidney disease, or unspecified chronic kidney disease: Secondary | ICD-10-CM | POA: Diagnosis not present

## 2024-01-27 DIAGNOSIS — I4891 Unspecified atrial fibrillation: Secondary | ICD-10-CM | POA: Insufficient documentation

## 2024-01-27 DIAGNOSIS — K5790 Diverticulosis of intestine, part unspecified, without perforation or abscess without bleeding: Secondary | ICD-10-CM | POA: Diagnosis not present

## 2024-01-27 DIAGNOSIS — I10 Essential (primary) hypertension: Secondary | ICD-10-CM | POA: Diagnosis not present

## 2024-01-27 DIAGNOSIS — E1122 Type 2 diabetes mellitus with diabetic chronic kidney disease: Secondary | ICD-10-CM | POA: Diagnosis not present

## 2024-01-27 DIAGNOSIS — L89892 Pressure ulcer of other site, stage 2: Secondary | ICD-10-CM | POA: Diagnosis not present

## 2024-01-27 DIAGNOSIS — N189 Chronic kidney disease, unspecified: Secondary | ICD-10-CM | POA: Diagnosis not present

## 2024-01-27 DIAGNOSIS — Z4800 Encounter for change or removal of nonsurgical wound dressing: Secondary | ICD-10-CM | POA: Diagnosis not present

## 2024-01-27 NOTE — Patient Instructions (Addendum)
 Medication Instructions:  Your physician recommends that you continue on your current medications as directed. Please refer to the Current Medication list given to you today.  Labwork: [facility] ([email or web address] [place]) or [facility]  Testing/Procedures: none  Follow-Up: Your physician recommends that you schedule a follow-up appointment in: 6 months  Any Other Special Instructions Will Be Listed Below (If Applicable).  If you need a refill on your cardiac medications before your next appointment, please call your pharmacy.

## 2024-01-27 NOTE — Progress Notes (Signed)
 Clinical Summary Scott [name] is a 78 y.o.male  seen today for follow up of the following medical problems.    1. Aortic stenosis - history of bicuspid AV - history of AVR 10/22/2016,  [facility] Ease Pericardial Tissue Valve (size 25mm, model # 3300TFX, serial # [id number]) along with 3 vessel CABG, MV repair, and repair of ascending aortic aneurysm     - OR on 10/22/16 for CABG X 3, AVR, MVR, repair of thoracic ascending aneurysm with placement of VAC on open chest Post op course significant for cardiogenic shock requiring multiple pressors, SAM requiring removal of mitral annuloplasty ring,  persistent fevers, encephalopathy, shocked liver, DIC due to consumptive intraoperative coagulopathy, diffuse clotting with ischemia of distal BUE/BLE extremities, A fib, C diff colitis, volume overload and VDRF with difficulty with vent wean requiring tracheostomy.  Sternal wound closed on 12/4 and was started on coumadin for RV thrombus and A fib.     [facility] course significant for MRSA bacteremia, pneumonitis, intermittent fevers, dysphagia requiring PEG tube placement on 1/15 by Dr. Fredia Sorrow. He developed progressive gangrenous changes of BUE and BLE requiring B-transtibial amputation , left transmetacarpal amputation and Right MCP amputation of right hand by Dr. Lajoyce Corners on 1/19.    01/2017 echo: 55-60%, no WMAs, grade II diastolic dysfunction. Normal functioning AV.   07/2019 echo: LVEF 55-60%, mild RV dysfunction, mild to mod MR   Jan 2023 echo: LVE 55-60%, indet diastolic fxn, mod RV dysfucntion, mod PASP, severe BAE, mild to mod MR, mod MS. Normal AVR. Dilated fixed IVC, normal AVR - no SOB/DOE, no edema         2. Chronic diastolic HF  Jan 2023 echo: LVE 55-60%, indet diastolic fxn, mod RV dysfucntion, mod PASP, severe BAE, mild to mod MR, mod MS. Normal AVR. Dilated fixed IVC - Dec 15 2021: Cr 1.23 BUN 25 BNP 2994    takes torsemide 80mg  bid, metolazone 2.5mg  on Wed and Sat - no  edema, no SOB/DOE     3. Mitral regurgitation - patient had MV ring placed during his surgery 10/22/16 along with AVR, ascending thoracic aneurysm repair, and CABG. - ring was subsequently removed during that operation due to significant [facility] Jan 2023 echo: LVE 55-60%, indet diastolic fxn, mod RV dysfucntion, mod PASP, severe BAE, mild to mod MR, mod MS. Normal AVR. Dilated fixed IVC       4. CAD - history of CABG 10/22/2016 (LIMA-Diag and distal LAD, SVG-LAD) 01/2017 echo: 55-60%, no WMAs, grade II diastolic dysfunction. Normal functioning AV.     - no recent chest pains.    5. Ischemic limbs - occurred in setting of cardiogenic shock requiring high dose pressors as well as DIC  - required amputation - followed by ortho. - has been on coumadin, ASA   6.Afib/aflutter - initally had post op afib - noted to be in aflutter at clinical f/u and thus was committed to long term anticoagulation  08/2019 monitor no significant arrhytmias   - no bleeding on coumadin     - 07/2019 ekg afib - 11/27/20 EKG SR - 1 2023 EKG afib   - rare palpitations, short in duratino - no bleeding on coumadin.     7. Hyperlipidemia - 02/2021 TC 99 TG 227 HDL 30 LDL 33 - he is on atorvastatin - recent labs with pcp 10/2022 LDL 39  - he reports more recent lipid panel with pcp, we will request results  Past Medical History:  Diagnosis Date   Arthritis    "hips, shoulders; knees; back" (10/20/2016)   Bicuspid aortic valve    BPH (benign prostatic hypertrophy)    Carpal tunnel syndrome of right wrist    Coronary artery disease involving native coronary artery of native heart with unstable angina pectoris (HCC) 10/20/2016   Diverticulitis    GERD (gastroesophageal reflux disease)    Gout    Heart murmur    Hyperlipemia    Hypertension    Postoperative atrial fibrillation (HCC) 10/24/2016   RLS (restless legs syndrome)    S/P aortic valve replacement with bioprosthetic valve 10/22/2016   25 mm  [facility] Ease bovine pericardial bioprosthetic tissue valve   S/P ascending aortic aneurysm repair 10/22/2016   28 mm supracoronary straight graft replacement of ascending thoracic aortic aneurysm   S/P CABG x 3 10/22/2016   Sequential LIMA to Diag and LAD, SVG to distal LAD, open vein harvest right thigh   S/P mitral valve repair 10/22/2016   Artificial Gore-tex neochord placement x6 - posterior annuloplasty band placed but removed due to systolic anterior motion of mitral valve   Severe aortic stenosis    Snores    Never been tested for sleep apnea   Thoracic ascending aortic aneurysm (HCC) 10/21/2016   Thrombocytopenia (HCC) 10/22/2016   Type II diabetes mellitus (HCC)      Allergies  Allergen Reactions   Sulfa Antibiotics Hives     Current Outpatient Medications  Medication Sig Dispense Refill   allopurinol (ZYLOPRIM) 300 MG tablet Take 300 mg by mouth daily.     aspirin EC 81 MG tablet Take 81 mg by mouth daily.     atorvastatin (LIPITOR) 40 MG tablet TAKE 1 TABLET DAILY AT 6PM 90 tablet 3   KLOR-CON M20 20 MEQ tablet TAKE 1 TABLET TWICE A DAY (Patient taking differently: Take 20 mEq by mouth 3 (three) times daily.) 180 tablet 3   Lactobacillus (PROBIOTIC ACIDOPHILUS PO) Take 1 capsule by mouth daily.     lisinopril (ZESTRIL) 2.5 MG tablet TAKE 1 TABLET DAILY 90 tablet 3   Magnesium Oxide (MAG-OX 400 PO) Take 400 mg by mouth 2 (two) times daily.     Melatonin 10 MG CAPS Take 10 mg by mouth at bedtime.      metFORMIN (GLUCOPHAGE) 500 MG tablet Take 1 tablet (500 mg total) by mouth in the morning. Take 2 tablets (1000 mg) every evening.     metolazone (ZAROXOLYN) 2.5 MG tablet TAKE 1 TABLET (2.5 MG TOTAL) BY MOUTH EVERY WEDNESDAY. 12 tablet 1   metoprolol succinate (TOPROL-XL) 100 MG 24 hr tablet Take 1 tablet (100 mg total) by mouth 2 (two) times daily. 180 tablet 3   MISC NATURAL PRODUCTS PO Take 1 tablet by mouth 2 (two) times daily. Sucontral D - natural product for  healthy blood sugar levels     nitroGLYCERIN (NITROSTAT) 0.4 MG SL tablet Place 1 tablet (0.4 mg total) under the tongue every 5 (five) minutes x 3 doses as needed (If no relief after 3rd dose, go to ED.). 25 tablet 3   rOPINIRole (REQUIP) 2 MG tablet Take 2 mg by mouth See admin instructions. Takes 2mg  at 4:30pm and 2mg  at 9:30pm.     tamsulosin (FLOMAX) 0.4 MG CAPS capsule Take 0.4 mg by mouth daily.     torsemide (DEMADEX) 20 MG tablet TAKE 4 TABLETS (80 MG TOTAL) BY MOUTH 2 (TWO) TIMES DAILY. 720 tablet 1   warfarin (COUMADIN) 2 MG tablet  TAKE 1 TO 2 TABLETS BY MOUTH DAILY OR AS DIRECTED 180 tablet 1   No current facility-administered medications for this visit.     Past Surgical History:  Procedure Laterality Date   AMPUTATION Bilateral 12/11/2016   Procedure: AMPUTATION BELOW KNEE BILATERALLY;  Surgeon: Nadara Mustard, MD;  Location: [facility];  Service: Orthopedics;  Laterality: Bilateral;   AMPUTATION Bilateral 12/11/2016   Procedure: AMPUTATION BILATERAL HANDS EXCEPT RIGHT THUMB;  Surgeon: Nadara Mustard, MD;  Location: [facility] OR;  Service: Orthopedics;  Laterality: Bilateral;   AORTIC VALVE REPLACEMENT N/A 10/22/2016   Procedure: AORTIC VALVE REPLACEMENT (AVR) WITH SIZE 25 MM MAGNA EASE PERICARDIAL BIOPROSTHESIS - AORTIC;  Surgeon: Purcell Nails, MD;  Location: [facility] OR;  Service: Open Heart Surgery;  Laterality: N/A;   BONE EXCISION Right 02/01/2017   Procedure: EXCISION RIGHT INDEX METACARPAL HEAD;  Surgeon: Nadara Mustard, MD;  Location: [facility] OR;  Service: Orthopedics;  Laterality: Right;   CARDIAC CATHETERIZATION N/A 10/20/2016   Procedure: Right/Left Heart Cath and Coronary Angiography;  Surgeon: Marykay Lex, MD;  Location: [facility] INVASIVE CV LAB;  Service: Cardiovascular;  Laterality: N/A;   CARPAL TUNNEL RELEASE Right 11/28/2013   Procedure: RIGHT WRIST CARPAL TUNNEL RELEASE;  Surgeon: Nilda Simmer, MD;  Location: [facility];  Service: Orthopedics;  Laterality: Right;    CATARACT EXTRACTION W/ INTRAOCULAR LENS  IMPLANT, BILATERAL Bilateral 1978   COLONOSCOPY     CORONARY ARTERY BYPASS GRAFT N/A 10/22/2016   Procedure: CORONARY ARTERY BYPASS GRAFTING (CABG)x 2 WITH LIMA TO DIAGONAL, OPEN  HARVESTING OF RIGHT SAPHENOUS VEIN FOR VEIN GRAFT TO LAD;  Surgeon: Purcell Nails, MD;  Location: [facility];  Service: Open Heart Surgery;  Laterality: N/A;   FRACTURE SURGERY     INGUINAL HERNIA REPAIR Right 1998   IR GENERIC HISTORICAL  12/07/2016   IR US GUIDE VASC ACCESS RIGHT 12/07/2016 Irish Lack, MD [facility]   IR GENERIC HISTORICAL  12/07/2016   IR RADIOLOGY PERIPHERAL GUIDED IV START 12/07/2016 Irish Lack, MD [facility]   IR GENERIC HISTORICAL  12/07/2016   IR GASTROSTOMY TUBE MOD SED 12/07/2016 Irish Lack, MD [facility]   LIPOMA EXCISION Right 2008   "side of my head"   MITRAL VALVE REPAIR N/A 10/22/2016   Procedure: MITRAL VALVE REPAIR (MVR) WITH SIZE 30 SORIN ANNULOFLEX ANNULOPLASTY RING WITH SUBSEQUENT REMOVAL OF RING;  Surgeon: Purcell Nails, MD;  Location: [facility];  Service: Open Heart Surgery;  Laterality: N/A;   PENECTOMY  2007   Peyronie's disease    PENILE PROSTHESIS IMPLANT  2009   REMOVAL OF PENILE PROSTHESIS N/A 02/01/2017   Procedure: REMOVAL OF PENILE PROSTHESIS;  Surgeon: Ihor Gully, MD;  Location: [facility] OR;  Service: Urology;  Laterality: N/A;   SHOULDER OPEN ROTATOR CUFF REPAIR Right 2006   STERNAL CLOSURE N/A 10/26/2016   Procedure: STERNAL WASHOUT AND DELAYED PRIMARY CLOSURE;  Surgeon: Purcell Nails, MD;  [facility];  Service: Thoracic;  Laterality: N/A;   TEE WITHOUT CARDIOVERSION N/A 10/26/2016   Procedure: TRANSESOPHAGEAL ECHOCARDIOGRAM (TEE);  Surgeon: Purcell Nails, MD;  Location: [facility] OR;  Service: Thoracic;  Laterality: N/A;   TEE WITHOUT CARDIOVERSION N/A 10/22/2016   Procedure: TRANSESOPHAGEAL ECHOCARDIOGRAM (TEE);  Surgeon: Purcell Nails, MD;  Location: [facility] OR;  Service: Open Heart Surgery;  Laterality: N/A;    THORACIC AORTIC ANEURYSM REPAIR  10/22/2016   Procedure: ASCENDING AORTIC  ANEURYSM REPAIR (AAA) WITH 28 MM HEMASHIELD PLATINUM  WOVEN DOUBLE VELOUR VASCULAR GRAFT;  Surgeon: Purcell Nails, MD;  Location: [facility];  Service: Open Heart Surgery;;   TONSILLECTOMY  ~ 1955   TRACHEOSTOMY TUBE PLACEMENT N/A 11/09/2016   Procedure: TRACHEOSTOMY;  Surgeon: Purcell Nails, MD;  [facility];  Service: Thoracic;  Laterality: N/A;   TRANSURETHRAL RESECTION OF PROSTATE  2005   TRIGGER FINGER RELEASE Bilateral    several lt and rt hands   TRIGGER FINGER RELEASE Right 11/28/2013   Procedure: RIGHT TRIGGER FINGER  RELEASE (TENDON SHEATH INCISION);  Surgeon: Nilda Simmer, MD;  Location: [facility];  Service: Orthopedics;  Laterality: Right;   VIDEO BRONCHOSCOPY N/A 11/09/2016   Procedure: VIDEO BRONCHOSCOPY;  Surgeon: Purcell Nails, MD;  Location: [facility] OR;  Service: Thoracic;  Laterality: N/A;   WRIST FRACTURE SURGERY Right ~ 1959     Allergies  Allergen Reactions   Sulfa Antibiotics Hives      Family History  Problem Relation Age of Onset   Lung cancer Mother    Clotting disorder Father        No details   Heart disease Sister 14       Stents   Cancer Sister        Throat     Social History Mr. Mahaney reports that he quit smoking about 41 years ago. His smoking use included pipe and cigars. He has never used smokeless tobacco. Mr. Peerson reports current alcohol use of about 10.0 standard drinks of alcohol per week.     Physical Examination Today's Vitals   01/27/24 1502  BP: 100/68  Pulse: 71  SpO2: 97%  Weight: 148 lb 9.6 oz (67.4 kg)  Height: 5\' 9"  (1.753 m)   Body mass index is 21.94 kg/m.  Gen: resting comfortably, no acute distress HEENT: no scleral icterus, pupils equal round and reactive, no palptable cervical adenopathy,  CV: irreg, 2/6 systolic murmur apex Resp: Clear to auscultation bilaterally GI: abdomen is soft, non-tender, non-distended,  normal bowel sounds, no hepatosplenomegaly MSK: extremities are warm, no edema.  Skin: warm, no rash Neuro:  no focal deficits Psych: appropriate affect   Diagnostic Studies  07/2019 echo 1. The left ventricle has normal systolic function, with an ejection fraction of 55-60%. The cavity size was normal. There is moderately increased left ventricular wall thickness. Left ventricular diastolic Doppler parameters are indeterminate.  2. The right ventricle has mildly reduced systolic function. The cavity was moderately enlarged. There is not assessed.  3. The ventricular septum flattens in systole consistent with RV pressure overload.  4. Left atrial size was severely dilated.  5. Right atrial size was mildly dilated.  6. Mitral valve regurgitation mild to moderate MR, the jet is eccentric and may be underestimated. No evidence of mitral valve stenosis.  7. The aortic valve was not well visualized. No stenosis of the aortic valve.  8. Edwards Magna Ease Pericardial Tissue Valve (size 25mm, model # 3300TFX is in the AV position.  9. The aorta is normal unless otherwise noted. 10. The aortic root is normal in size and structure. 11. Pulmonary hypertension is indeterminate, inadequate TR jet. 12. The inferior vena cava was dilated in size with >50% respiratory variability.     Jan 2023 echo 1. Left ventricular ejection fraction, by estimation, is 55 to 60%. The  left ventricle has normal function. Left ventricular endocardial border  not optimally defined to evaluate regional wall motion. Left ventricular  diastolic function could not be  evaluated.   2. Right ventricular systolic function is moderately reduced. The right  ventricular size is moderately enlarged. There is moderately elevated  pulmonary artery systolic pressure. The estimated right ventricular  systolic pressure is 51.7 mmHg.   3. Left atrial size was severely dilated.   4. Right atrial size was severely dilated.   5. The  mitral valve is degenerative. Mild to moderate mitral valve  regurgitation. Mild mitral stenosis. The mean mitral valve gradient is 7.0  mmHg. Moderate mitral annular calcification.   6. The aortic valve has been repaired/replaced. There is a 25 mm Edwards  Magna Pericardial Tissue valve present in the aortic position.      Aortic valve regurgitation is not visualized. No aortic stenosis is  present. Aortic valve area, by VTI measures 2.42 cm. Aortic valve mean  gradient measures 5.0 mmHg. Aortic valve Vmax measures 1.64 m/s.   7. Aortic root/ascending aorta has been repaired/replaced with normal  dimensions.   8. The inferior vena cava is dilated in size with <50% respiratory  variability, suggesting right atrial pressure of 15 mmHg.   9. There is a trivial pericardial effusion that is circumferential.    Assessment and Plan   1.Chronic diastolic - euvolemic without symptoms - repeat bmet/mg - continue curret diuretics   2. CAD - no symptoms, continue current meds   3. Afib/acquired thrombophilia - no symptoms, continue current meds - afib with tissue valve however have favored coumadin , he does have a moderate gradient across his mitral valve and would qualify as having valvular afib - EKG shows rate controlled, afib, continue current meds  4. Mitral regurgitation - repeat echo next visit   F/u 6 months     Antoine Poche, M.D.

## 2024-01-28 DIAGNOSIS — Z7901 Long term (current) use of anticoagulants: Secondary | ICD-10-CM | POA: Diagnosis not present

## 2024-01-28 DIAGNOSIS — G2581 Restless legs syndrome: Secondary | ICD-10-CM | POA: Diagnosis not present

## 2024-01-28 DIAGNOSIS — K5790 Diverticulosis of intestine, part unspecified, without perforation or abscess without bleeding: Secondary | ICD-10-CM | POA: Diagnosis not present

## 2024-01-28 DIAGNOSIS — B009 Herpesviral infection, unspecified: Secondary | ICD-10-CM | POA: Diagnosis not present

## 2024-01-28 DIAGNOSIS — N400 Enlarged prostate without lower urinary tract symptoms: Secondary | ICD-10-CM | POA: Diagnosis not present

## 2024-01-28 DIAGNOSIS — M109 Gout, unspecified: Secondary | ICD-10-CM | POA: Diagnosis not present

## 2024-01-28 DIAGNOSIS — F1090 Alcohol use, unspecified, uncomplicated: Secondary | ICD-10-CM | POA: Diagnosis not present

## 2024-01-28 DIAGNOSIS — Z7984 Long term (current) use of oral hypoglycemic drugs: Secondary | ICD-10-CM | POA: Diagnosis not present

## 2024-01-28 DIAGNOSIS — I129 Hypertensive chronic kidney disease with stage 1 through stage 4 chronic kidney disease, or unspecified chronic kidney disease: Secondary | ICD-10-CM | POA: Diagnosis not present

## 2024-01-28 DIAGNOSIS — Z89022 Acquired absence of left finger(s): Secondary | ICD-10-CM | POA: Diagnosis not present

## 2024-01-28 DIAGNOSIS — Z4800 Encounter for change or removal of nonsurgical wound dressing: Secondary | ICD-10-CM | POA: Diagnosis not present

## 2024-01-28 DIAGNOSIS — I4891 Unspecified atrial fibrillation: Secondary | ICD-10-CM | POA: Diagnosis not present

## 2024-01-28 DIAGNOSIS — M810 Age-related osteoporosis without current pathological fracture: Secondary | ICD-10-CM | POA: Diagnosis not present

## 2024-01-28 DIAGNOSIS — Z89511 Acquired absence of right leg below knee: Secondary | ICD-10-CM | POA: Diagnosis not present

## 2024-01-28 DIAGNOSIS — N189 Chronic kidney disease, unspecified: Secondary | ICD-10-CM | POA: Diagnosis not present

## 2024-01-28 DIAGNOSIS — E7800 Pure hypercholesterolemia, unspecified: Secondary | ICD-10-CM | POA: Diagnosis not present

## 2024-01-28 DIAGNOSIS — N529 Male erectile dysfunction, unspecified: Secondary | ICD-10-CM | POA: Diagnosis not present

## 2024-01-28 DIAGNOSIS — Z89021 Acquired absence of right finger(s): Secondary | ICD-10-CM | POA: Diagnosis not present

## 2024-01-28 DIAGNOSIS — M1990 Unspecified osteoarthritis, unspecified site: Secondary | ICD-10-CM | POA: Diagnosis not present

## 2024-01-28 DIAGNOSIS — L89892 Pressure ulcer of other site, stage 2: Secondary | ICD-10-CM | POA: Diagnosis not present

## 2024-01-28 DIAGNOSIS — E1122 Type 2 diabetes mellitus with diabetic chronic kidney disease: Secondary | ICD-10-CM | POA: Diagnosis not present

## 2024-01-28 DIAGNOSIS — Z89512 Acquired absence of left leg below knee: Secondary | ICD-10-CM | POA: Diagnosis not present

## 2024-02-01 DIAGNOSIS — I129 Hypertensive chronic kidney disease with stage 1 through stage 4 chronic kidney disease, or unspecified chronic kidney disease: Secondary | ICD-10-CM | POA: Diagnosis not present

## 2024-02-01 DIAGNOSIS — K5790 Diverticulosis of intestine, part unspecified, without perforation or abscess without bleeding: Secondary | ICD-10-CM | POA: Diagnosis not present

## 2024-02-01 DIAGNOSIS — N189 Chronic kidney disease, unspecified: Secondary | ICD-10-CM | POA: Diagnosis not present

## 2024-02-01 DIAGNOSIS — L89892 Pressure ulcer of other site, stage 2: Secondary | ICD-10-CM | POA: Diagnosis not present

## 2024-02-01 DIAGNOSIS — E1122 Type 2 diabetes mellitus with diabetic chronic kidney disease: Secondary | ICD-10-CM | POA: Diagnosis not present

## 2024-02-01 DIAGNOSIS — Z4800 Encounter for change or removal of nonsurgical wound dressing: Secondary | ICD-10-CM | POA: Diagnosis not present

## 2024-02-03 DIAGNOSIS — E1122 Type 2 diabetes mellitus with diabetic chronic kidney disease: Secondary | ICD-10-CM | POA: Diagnosis not present

## 2024-02-03 DIAGNOSIS — L89892 Pressure ulcer of other site, stage 2: Secondary | ICD-10-CM | POA: Diagnosis not present

## 2024-02-03 DIAGNOSIS — I129 Hypertensive chronic kidney disease with stage 1 through stage 4 chronic kidney disease, or unspecified chronic kidney disease: Secondary | ICD-10-CM | POA: Diagnosis not present

## 2024-02-03 DIAGNOSIS — K5790 Diverticulosis of intestine, part unspecified, without perforation or abscess without bleeding: Secondary | ICD-10-CM | POA: Diagnosis not present

## 2024-02-03 DIAGNOSIS — N189 Chronic kidney disease, unspecified: Secondary | ICD-10-CM | POA: Diagnosis not present

## 2024-02-03 DIAGNOSIS — Z4800 Encounter for change or removal of nonsurgical wound dressing: Secondary | ICD-10-CM | POA: Diagnosis not present

## 2024-02-08 DIAGNOSIS — L89893 Pressure ulcer of other site, stage 3: Secondary | ICD-10-CM | POA: Diagnosis not present

## 2024-02-11 DIAGNOSIS — I129 Hypertensive chronic kidney disease with stage 1 through stage 4 chronic kidney disease, or unspecified chronic kidney disease: Secondary | ICD-10-CM | POA: Diagnosis not present

## 2024-02-11 DIAGNOSIS — Z4800 Encounter for change or removal of nonsurgical wound dressing: Secondary | ICD-10-CM | POA: Diagnosis not present

## 2024-02-11 DIAGNOSIS — E1122 Type 2 diabetes mellitus with diabetic chronic kidney disease: Secondary | ICD-10-CM | POA: Diagnosis not present

## 2024-02-11 DIAGNOSIS — K5790 Diverticulosis of intestine, part unspecified, without perforation or abscess without bleeding: Secondary | ICD-10-CM | POA: Diagnosis not present

## 2024-02-11 DIAGNOSIS — N189 Chronic kidney disease, unspecified: Secondary | ICD-10-CM | POA: Diagnosis not present

## 2024-02-11 DIAGNOSIS — L89892 Pressure ulcer of other site, stage 2: Secondary | ICD-10-CM | POA: Diagnosis not present

## 2024-02-15 DIAGNOSIS — L89893 Pressure ulcer of other site, stage 3: Secondary | ICD-10-CM | POA: Diagnosis not present

## 2024-02-18 DIAGNOSIS — E1122 Type 2 diabetes mellitus with diabetic chronic kidney disease: Secondary | ICD-10-CM | POA: Diagnosis not present

## 2024-02-18 DIAGNOSIS — L89892 Pressure ulcer of other site, stage 2: Secondary | ICD-10-CM | POA: Diagnosis not present

## 2024-02-18 DIAGNOSIS — N189 Chronic kidney disease, unspecified: Secondary | ICD-10-CM | POA: Diagnosis not present

## 2024-02-18 DIAGNOSIS — K5790 Diverticulosis of intestine, part unspecified, without perforation or abscess without bleeding: Secondary | ICD-10-CM | POA: Diagnosis not present

## 2024-02-18 DIAGNOSIS — I129 Hypertensive chronic kidney disease with stage 1 through stage 4 chronic kidney disease, or unspecified chronic kidney disease: Secondary | ICD-10-CM | POA: Diagnosis not present

## 2024-02-18 DIAGNOSIS — Z4800 Encounter for change or removal of nonsurgical wound dressing: Secondary | ICD-10-CM | POA: Diagnosis not present

## 2024-02-21 DIAGNOSIS — E1140 Type 2 diabetes mellitus with diabetic neuropathy, unspecified: Secondary | ICD-10-CM | POA: Diagnosis not present

## 2024-02-21 DIAGNOSIS — I10 Essential (primary) hypertension: Secondary | ICD-10-CM | POA: Diagnosis not present

## 2024-02-21 DIAGNOSIS — N400 Enlarged prostate without lower urinary tract symptoms: Secondary | ICD-10-CM | POA: Diagnosis not present

## 2024-02-21 DIAGNOSIS — E782 Mixed hyperlipidemia: Secondary | ICD-10-CM | POA: Diagnosis not present

## 2024-02-22 DIAGNOSIS — L89893 Pressure ulcer of other site, stage 3: Secondary | ICD-10-CM | POA: Diagnosis not present

## 2024-02-25 DIAGNOSIS — K5790 Diverticulosis of intestine, part unspecified, without perforation or abscess without bleeding: Secondary | ICD-10-CM | POA: Diagnosis not present

## 2024-02-25 DIAGNOSIS — N189 Chronic kidney disease, unspecified: Secondary | ICD-10-CM | POA: Diagnosis not present

## 2024-02-25 DIAGNOSIS — E1122 Type 2 diabetes mellitus with diabetic chronic kidney disease: Secondary | ICD-10-CM | POA: Diagnosis not present

## 2024-02-25 DIAGNOSIS — I129 Hypertensive chronic kidney disease with stage 1 through stage 4 chronic kidney disease, or unspecified chronic kidney disease: Secondary | ICD-10-CM | POA: Diagnosis not present

## 2024-02-25 DIAGNOSIS — Z4800 Encounter for change or removal of nonsurgical wound dressing: Secondary | ICD-10-CM | POA: Diagnosis not present

## 2024-02-25 DIAGNOSIS — L89892 Pressure ulcer of other site, stage 2: Secondary | ICD-10-CM | POA: Diagnosis not present

## 2024-02-27 DIAGNOSIS — Z7901 Long term (current) use of anticoagulants: Secondary | ICD-10-CM | POA: Diagnosis not present

## 2024-02-27 DIAGNOSIS — G2581 Restless legs syndrome: Secondary | ICD-10-CM | POA: Diagnosis not present

## 2024-02-27 DIAGNOSIS — E7800 Pure hypercholesterolemia, unspecified: Secondary | ICD-10-CM | POA: Diagnosis not present

## 2024-02-27 DIAGNOSIS — F1090 Alcohol use, unspecified, uncomplicated: Secondary | ICD-10-CM | POA: Diagnosis not present

## 2024-02-27 DIAGNOSIS — M1990 Unspecified osteoarthritis, unspecified site: Secondary | ICD-10-CM | POA: Diagnosis not present

## 2024-02-27 DIAGNOSIS — Z7984 Long term (current) use of oral hypoglycemic drugs: Secondary | ICD-10-CM | POA: Diagnosis not present

## 2024-02-27 DIAGNOSIS — Z89511 Acquired absence of right leg below knee: Secondary | ICD-10-CM | POA: Diagnosis not present

## 2024-02-27 DIAGNOSIS — K5790 Diverticulosis of intestine, part unspecified, without perforation or abscess without bleeding: Secondary | ICD-10-CM | POA: Diagnosis not present

## 2024-02-27 DIAGNOSIS — N400 Enlarged prostate without lower urinary tract symptoms: Secondary | ICD-10-CM | POA: Diagnosis not present

## 2024-02-27 DIAGNOSIS — B009 Herpesviral infection, unspecified: Secondary | ICD-10-CM | POA: Diagnosis not present

## 2024-02-27 DIAGNOSIS — I4891 Unspecified atrial fibrillation: Secondary | ICD-10-CM | POA: Diagnosis not present

## 2024-02-27 DIAGNOSIS — Z89022 Acquired absence of left finger(s): Secondary | ICD-10-CM | POA: Diagnosis not present

## 2024-02-27 DIAGNOSIS — M109 Gout, unspecified: Secondary | ICD-10-CM | POA: Diagnosis not present

## 2024-02-27 DIAGNOSIS — N189 Chronic kidney disease, unspecified: Secondary | ICD-10-CM | POA: Diagnosis not present

## 2024-02-27 DIAGNOSIS — E1122 Type 2 diabetes mellitus with diabetic chronic kidney disease: Secondary | ICD-10-CM | POA: Diagnosis not present

## 2024-02-27 DIAGNOSIS — M810 Age-related osteoporosis without current pathological fracture: Secondary | ICD-10-CM | POA: Diagnosis not present

## 2024-02-27 DIAGNOSIS — Z89512 Acquired absence of left leg below knee: Secondary | ICD-10-CM | POA: Diagnosis not present

## 2024-02-27 DIAGNOSIS — I129 Hypertensive chronic kidney disease with stage 1 through stage 4 chronic kidney disease, or unspecified chronic kidney disease: Secondary | ICD-10-CM | POA: Diagnosis not present

## 2024-02-27 DIAGNOSIS — Z89021 Acquired absence of right finger(s): Secondary | ICD-10-CM | POA: Diagnosis not present

## 2024-02-27 DIAGNOSIS — L89892 Pressure ulcer of other site, stage 2: Secondary | ICD-10-CM | POA: Diagnosis not present

## 2024-02-29 DIAGNOSIS — L89893 Pressure ulcer of other site, stage 3: Secondary | ICD-10-CM | POA: Diagnosis not present

## 2024-03-03 DIAGNOSIS — E1122 Type 2 diabetes mellitus with diabetic chronic kidney disease: Secondary | ICD-10-CM | POA: Diagnosis not present

## 2024-03-03 DIAGNOSIS — I129 Hypertensive chronic kidney disease with stage 1 through stage 4 chronic kidney disease, or unspecified chronic kidney disease: Secondary | ICD-10-CM | POA: Diagnosis not present

## 2024-03-03 DIAGNOSIS — K5790 Diverticulosis of intestine, part unspecified, without perforation or abscess without bleeding: Secondary | ICD-10-CM | POA: Diagnosis not present

## 2024-03-03 DIAGNOSIS — I4891 Unspecified atrial fibrillation: Secondary | ICD-10-CM | POA: Diagnosis not present

## 2024-03-03 DIAGNOSIS — N189 Chronic kidney disease, unspecified: Secondary | ICD-10-CM | POA: Diagnosis not present

## 2024-03-03 DIAGNOSIS — L89892 Pressure ulcer of other site, stage 2: Secondary | ICD-10-CM | POA: Diagnosis not present

## 2024-03-06 DIAGNOSIS — E7849 Other hyperlipidemia: Secondary | ICD-10-CM | POA: Diagnosis not present

## 2024-03-06 DIAGNOSIS — I482 Chronic atrial fibrillation, unspecified: Secondary | ICD-10-CM | POA: Diagnosis not present

## 2024-03-06 DIAGNOSIS — Z8679 Personal history of other diseases of the circulatory system: Secondary | ICD-10-CM | POA: Diagnosis not present

## 2024-03-06 DIAGNOSIS — E7801 Familial hypercholesterolemia: Secondary | ICD-10-CM | POA: Diagnosis not present

## 2024-03-06 DIAGNOSIS — E782 Mixed hyperlipidemia: Secondary | ICD-10-CM | POA: Diagnosis not present

## 2024-03-06 DIAGNOSIS — Z1321 Encounter for screening for nutritional disorder: Secondary | ICD-10-CM | POA: Diagnosis not present

## 2024-03-06 DIAGNOSIS — I10 Essential (primary) hypertension: Secondary | ICD-10-CM | POA: Diagnosis not present

## 2024-03-06 DIAGNOSIS — Z131 Encounter for screening for diabetes mellitus: Secondary | ICD-10-CM | POA: Diagnosis not present

## 2024-03-07 DIAGNOSIS — L89893 Pressure ulcer of other site, stage 3: Secondary | ICD-10-CM | POA: Diagnosis not present

## 2024-03-08 ENCOUNTER — Ambulatory Visit: Attending: Cardiology | Admitting: *Deleted

## 2024-03-08 DIAGNOSIS — I4891 Unspecified atrial fibrillation: Secondary | ICD-10-CM | POA: Insufficient documentation

## 2024-03-08 DIAGNOSIS — I82603 Acute embolism and thrombosis of unspecified veins of upper extremity, bilateral: Secondary | ICD-10-CM | POA: Diagnosis not present

## 2024-03-08 DIAGNOSIS — Q2381 Bicuspid aortic valve: Secondary | ICD-10-CM | POA: Insufficient documentation

## 2024-03-08 DIAGNOSIS — Z9889 Other specified postprocedural states: Secondary | ICD-10-CM | POA: Insufficient documentation

## 2024-03-08 DIAGNOSIS — I9789 Other postprocedural complications and disorders of the circulatory system, not elsewhere classified: Secondary | ICD-10-CM | POA: Diagnosis not present

## 2024-03-08 LAB — POCT INR: INR: 3.4 — AB (ref 2.0–3.0)

## 2024-03-08 NOTE — Patient Instructions (Signed)
 Hold warfarin tonight then resume 2 tablets daily except 1 tablet on Sundays, Tuesdays and Thursdays Recheck in 6 wks

## 2024-03-10 DIAGNOSIS — I129 Hypertensive chronic kidney disease with stage 1 through stage 4 chronic kidney disease, or unspecified chronic kidney disease: Secondary | ICD-10-CM | POA: Diagnosis not present

## 2024-03-10 DIAGNOSIS — E1122 Type 2 diabetes mellitus with diabetic chronic kidney disease: Secondary | ICD-10-CM | POA: Diagnosis not present

## 2024-03-10 DIAGNOSIS — N189 Chronic kidney disease, unspecified: Secondary | ICD-10-CM | POA: Diagnosis not present

## 2024-03-10 DIAGNOSIS — K5790 Diverticulosis of intestine, part unspecified, without perforation or abscess without bleeding: Secondary | ICD-10-CM | POA: Diagnosis not present

## 2024-03-10 DIAGNOSIS — I4891 Unspecified atrial fibrillation: Secondary | ICD-10-CM | POA: Diagnosis not present

## 2024-03-10 DIAGNOSIS — L89892 Pressure ulcer of other site, stage 2: Secondary | ICD-10-CM | POA: Diagnosis not present

## 2024-03-13 DIAGNOSIS — E782 Mixed hyperlipidemia: Secondary | ICD-10-CM | POA: Diagnosis not present

## 2024-03-13 DIAGNOSIS — I10 Essential (primary) hypertension: Secondary | ICD-10-CM | POA: Diagnosis not present

## 2024-03-13 DIAGNOSIS — N400 Enlarged prostate without lower urinary tract symptoms: Secondary | ICD-10-CM | POA: Diagnosis not present

## 2024-03-13 DIAGNOSIS — Z23 Encounter for immunization: Secondary | ICD-10-CM | POA: Diagnosis not present

## 2024-03-13 DIAGNOSIS — E1140 Type 2 diabetes mellitus with diabetic neuropathy, unspecified: Secondary | ICD-10-CM | POA: Diagnosis not present

## 2024-03-13 DIAGNOSIS — E7849 Other hyperlipidemia: Secondary | ICD-10-CM | POA: Diagnosis not present

## 2024-03-13 DIAGNOSIS — Z6822 Body mass index (BMI) 22.0-22.9, adult: Secondary | ICD-10-CM | POA: Diagnosis not present

## 2024-03-17 DIAGNOSIS — I129 Hypertensive chronic kidney disease with stage 1 through stage 4 chronic kidney disease, or unspecified chronic kidney disease: Secondary | ICD-10-CM | POA: Diagnosis not present

## 2024-03-17 DIAGNOSIS — K5790 Diverticulosis of intestine, part unspecified, without perforation or abscess without bleeding: Secondary | ICD-10-CM | POA: Diagnosis not present

## 2024-03-17 DIAGNOSIS — E1122 Type 2 diabetes mellitus with diabetic chronic kidney disease: Secondary | ICD-10-CM | POA: Diagnosis not present

## 2024-03-17 DIAGNOSIS — I4891 Unspecified atrial fibrillation: Secondary | ICD-10-CM | POA: Diagnosis not present

## 2024-03-17 DIAGNOSIS — L89892 Pressure ulcer of other site, stage 2: Secondary | ICD-10-CM | POA: Diagnosis not present

## 2024-03-17 DIAGNOSIS — N189 Chronic kidney disease, unspecified: Secondary | ICD-10-CM | POA: Diagnosis not present

## 2024-03-21 DIAGNOSIS — I129 Hypertensive chronic kidney disease with stage 1 through stage 4 chronic kidney disease, or unspecified chronic kidney disease: Secondary | ICD-10-CM | POA: Diagnosis not present

## 2024-03-21 DIAGNOSIS — N189 Chronic kidney disease, unspecified: Secondary | ICD-10-CM | POA: Diagnosis not present

## 2024-03-21 DIAGNOSIS — I4891 Unspecified atrial fibrillation: Secondary | ICD-10-CM | POA: Diagnosis not present

## 2024-03-21 DIAGNOSIS — L89892 Pressure ulcer of other site, stage 2: Secondary | ICD-10-CM | POA: Diagnosis not present

## 2024-03-21 DIAGNOSIS — E1122 Type 2 diabetes mellitus with diabetic chronic kidney disease: Secondary | ICD-10-CM | POA: Diagnosis not present

## 2024-03-21 DIAGNOSIS — K5790 Diverticulosis of intestine, part unspecified, without perforation or abscess without bleeding: Secondary | ICD-10-CM | POA: Diagnosis not present

## 2024-03-22 DIAGNOSIS — E782 Mixed hyperlipidemia: Secondary | ICD-10-CM | POA: Diagnosis not present

## 2024-03-22 DIAGNOSIS — I10 Essential (primary) hypertension: Secondary | ICD-10-CM | POA: Diagnosis not present

## 2024-03-22 DIAGNOSIS — N400 Enlarged prostate without lower urinary tract symptoms: Secondary | ICD-10-CM | POA: Diagnosis not present

## 2024-03-22 DIAGNOSIS — E1140 Type 2 diabetes mellitus with diabetic neuropathy, unspecified: Secondary | ICD-10-CM | POA: Diagnosis not present

## 2024-03-24 DIAGNOSIS — N189 Chronic kidney disease, unspecified: Secondary | ICD-10-CM | POA: Diagnosis not present

## 2024-03-24 DIAGNOSIS — L89892 Pressure ulcer of other site, stage 2: Secondary | ICD-10-CM | POA: Diagnosis not present

## 2024-03-24 DIAGNOSIS — I129 Hypertensive chronic kidney disease with stage 1 through stage 4 chronic kidney disease, or unspecified chronic kidney disease: Secondary | ICD-10-CM | POA: Diagnosis not present

## 2024-03-24 DIAGNOSIS — K5790 Diverticulosis of intestine, part unspecified, without perforation or abscess without bleeding: Secondary | ICD-10-CM | POA: Diagnosis not present

## 2024-03-24 DIAGNOSIS — E1122 Type 2 diabetes mellitus with diabetic chronic kidney disease: Secondary | ICD-10-CM | POA: Diagnosis not present

## 2024-03-24 DIAGNOSIS — I4891 Unspecified atrial fibrillation: Secondary | ICD-10-CM | POA: Diagnosis not present

## 2024-03-28 DIAGNOSIS — M1990 Unspecified osteoarthritis, unspecified site: Secondary | ICD-10-CM | POA: Diagnosis not present

## 2024-03-28 DIAGNOSIS — N400 Enlarged prostate without lower urinary tract symptoms: Secondary | ICD-10-CM | POA: Diagnosis not present

## 2024-03-28 DIAGNOSIS — Z7984 Long term (current) use of oral hypoglycemic drugs: Secondary | ICD-10-CM | POA: Diagnosis not present

## 2024-03-28 DIAGNOSIS — Z89021 Acquired absence of right finger(s): Secondary | ICD-10-CM | POA: Diagnosis not present

## 2024-03-28 DIAGNOSIS — Z89022 Acquired absence of left finger(s): Secondary | ICD-10-CM | POA: Diagnosis not present

## 2024-03-28 DIAGNOSIS — I129 Hypertensive chronic kidney disease with stage 1 through stage 4 chronic kidney disease, or unspecified chronic kidney disease: Secondary | ICD-10-CM | POA: Diagnosis not present

## 2024-03-28 DIAGNOSIS — M109 Gout, unspecified: Secondary | ICD-10-CM | POA: Diagnosis not present

## 2024-03-28 DIAGNOSIS — Z7901 Long term (current) use of anticoagulants: Secondary | ICD-10-CM | POA: Diagnosis not present

## 2024-03-28 DIAGNOSIS — Z89512 Acquired absence of left leg below knee: Secondary | ICD-10-CM | POA: Diagnosis not present

## 2024-03-28 DIAGNOSIS — L89892 Pressure ulcer of other site, stage 2: Secondary | ICD-10-CM | POA: Diagnosis not present

## 2024-03-28 DIAGNOSIS — M810 Age-related osteoporosis without current pathological fracture: Secondary | ICD-10-CM | POA: Diagnosis not present

## 2024-03-28 DIAGNOSIS — F1090 Alcohol use, unspecified, uncomplicated: Secondary | ICD-10-CM | POA: Diagnosis not present

## 2024-03-28 DIAGNOSIS — B009 Herpesviral infection, unspecified: Secondary | ICD-10-CM | POA: Diagnosis not present

## 2024-03-28 DIAGNOSIS — N189 Chronic kidney disease, unspecified: Secondary | ICD-10-CM | POA: Diagnosis not present

## 2024-03-28 DIAGNOSIS — Z89511 Acquired absence of right leg below knee: Secondary | ICD-10-CM | POA: Diagnosis not present

## 2024-03-28 DIAGNOSIS — E1122 Type 2 diabetes mellitus with diabetic chronic kidney disease: Secondary | ICD-10-CM | POA: Diagnosis not present

## 2024-03-28 DIAGNOSIS — G2581 Restless legs syndrome: Secondary | ICD-10-CM | POA: Diagnosis not present

## 2024-03-28 DIAGNOSIS — K5790 Diverticulosis of intestine, part unspecified, without perforation or abscess without bleeding: Secondary | ICD-10-CM | POA: Diagnosis not present

## 2024-03-28 DIAGNOSIS — I4891 Unspecified atrial fibrillation: Secondary | ICD-10-CM | POA: Diagnosis not present

## 2024-03-28 DIAGNOSIS — E7800 Pure hypercholesterolemia, unspecified: Secondary | ICD-10-CM | POA: Diagnosis not present

## 2024-03-31 DIAGNOSIS — E1122 Type 2 diabetes mellitus with diabetic chronic kidney disease: Secondary | ICD-10-CM | POA: Diagnosis not present

## 2024-03-31 DIAGNOSIS — K5790 Diverticulosis of intestine, part unspecified, without perforation or abscess without bleeding: Secondary | ICD-10-CM | POA: Diagnosis not present

## 2024-03-31 DIAGNOSIS — I129 Hypertensive chronic kidney disease with stage 1 through stage 4 chronic kidney disease, or unspecified chronic kidney disease: Secondary | ICD-10-CM | POA: Diagnosis not present

## 2024-03-31 DIAGNOSIS — L89892 Pressure ulcer of other site, stage 2: Secondary | ICD-10-CM | POA: Diagnosis not present

## 2024-03-31 DIAGNOSIS — I4891 Unspecified atrial fibrillation: Secondary | ICD-10-CM | POA: Diagnosis not present

## 2024-03-31 DIAGNOSIS — N189 Chronic kidney disease, unspecified: Secondary | ICD-10-CM | POA: Diagnosis not present

## 2024-04-04 DIAGNOSIS — N189 Chronic kidney disease, unspecified: Secondary | ICD-10-CM | POA: Diagnosis not present

## 2024-04-04 DIAGNOSIS — I129 Hypertensive chronic kidney disease with stage 1 through stage 4 chronic kidney disease, or unspecified chronic kidney disease: Secondary | ICD-10-CM | POA: Diagnosis not present

## 2024-04-04 DIAGNOSIS — K5790 Diverticulosis of intestine, part unspecified, without perforation or abscess without bleeding: Secondary | ICD-10-CM | POA: Diagnosis not present

## 2024-04-04 DIAGNOSIS — I4891 Unspecified atrial fibrillation: Secondary | ICD-10-CM | POA: Diagnosis not present

## 2024-04-04 DIAGNOSIS — L89892 Pressure ulcer of other site, stage 2: Secondary | ICD-10-CM | POA: Diagnosis not present

## 2024-04-04 DIAGNOSIS — E1122 Type 2 diabetes mellitus with diabetic chronic kidney disease: Secondary | ICD-10-CM | POA: Diagnosis not present

## 2024-04-07 DIAGNOSIS — L89892 Pressure ulcer of other site, stage 2: Secondary | ICD-10-CM | POA: Diagnosis not present

## 2024-04-07 DIAGNOSIS — N189 Chronic kidney disease, unspecified: Secondary | ICD-10-CM | POA: Diagnosis not present

## 2024-04-07 DIAGNOSIS — I129 Hypertensive chronic kidney disease with stage 1 through stage 4 chronic kidney disease, or unspecified chronic kidney disease: Secondary | ICD-10-CM | POA: Diagnosis not present

## 2024-04-07 DIAGNOSIS — E1122 Type 2 diabetes mellitus with diabetic chronic kidney disease: Secondary | ICD-10-CM | POA: Diagnosis not present

## 2024-04-07 DIAGNOSIS — I4891 Unspecified atrial fibrillation: Secondary | ICD-10-CM | POA: Diagnosis not present

## 2024-04-07 DIAGNOSIS — K5790 Diverticulosis of intestine, part unspecified, without perforation or abscess without bleeding: Secondary | ICD-10-CM | POA: Diagnosis not present

## 2024-04-10 ENCOUNTER — Other Ambulatory Visit: Payer: Self-pay | Admitting: Cardiology

## 2024-04-11 ENCOUNTER — Telehealth: Payer: Self-pay | Admitting: Cardiology

## 2024-04-11 DIAGNOSIS — I4891 Unspecified atrial fibrillation: Secondary | ICD-10-CM | POA: Diagnosis not present

## 2024-04-11 DIAGNOSIS — E1122 Type 2 diabetes mellitus with diabetic chronic kidney disease: Secondary | ICD-10-CM | POA: Diagnosis not present

## 2024-04-11 DIAGNOSIS — N189 Chronic kidney disease, unspecified: Secondary | ICD-10-CM | POA: Diagnosis not present

## 2024-04-11 DIAGNOSIS — K5790 Diverticulosis of intestine, part unspecified, without perforation or abscess without bleeding: Secondary | ICD-10-CM | POA: Diagnosis not present

## 2024-04-11 DIAGNOSIS — I129 Hypertensive chronic kidney disease with stage 1 through stage 4 chronic kidney disease, or unspecified chronic kidney disease: Secondary | ICD-10-CM | POA: Diagnosis not present

## 2024-04-11 DIAGNOSIS — L89892 Pressure ulcer of other site, stage 2: Secondary | ICD-10-CM | POA: Diagnosis not present

## 2024-04-11 NOTE — Telephone Encounter (Signed)
   Pre-operative Risk Assessment    Patient Name: Scott [name]  DOB: 12-25-1945 MRN: [id number]   Date of last office visit: 01/27/24 Date of next office visit: n/a  Request for Surgical Clearance    Procedure:  Dental Extraction - Amount of Teeth to be Pulled:  1 (12)  Date of Surgery:  Clearance TBD                                Surgeon:  Dr. Burnette Carte  Surgeon's Group or Practice Name:  Dr. Burnette Carte Phone number:  [phone] Fax number:  [phone]   Type of Clearance Requested:   - Medical  - Pharmacy:  Hold        Type of Anesthesia:  General    Additional requests/questions:     Ryland Cozier   04/11/2024, 2:39 PM

## 2024-04-12 NOTE — Telephone Encounter (Signed)
 Pharmacy please advise on holding warfarin prior to dental extractions scheduled for TBD. Thank you.

## 2024-04-14 DIAGNOSIS — I129 Hypertensive chronic kidney disease with stage 1 through stage 4 chronic kidney disease, or unspecified chronic kidney disease: Secondary | ICD-10-CM | POA: Diagnosis not present

## 2024-04-14 DIAGNOSIS — N189 Chronic kidney disease, unspecified: Secondary | ICD-10-CM | POA: Diagnosis not present

## 2024-04-14 DIAGNOSIS — L89892 Pressure ulcer of other site, stage 2: Secondary | ICD-10-CM | POA: Diagnosis not present

## 2024-04-14 DIAGNOSIS — E1122 Type 2 diabetes mellitus with diabetic chronic kidney disease: Secondary | ICD-10-CM | POA: Diagnosis not present

## 2024-04-14 DIAGNOSIS — K5790 Diverticulosis of intestine, part unspecified, without perforation or abscess without bleeding: Secondary | ICD-10-CM | POA: Diagnosis not present

## 2024-04-14 DIAGNOSIS — I4891 Unspecified atrial fibrillation: Secondary | ICD-10-CM | POA: Diagnosis not present

## 2024-04-17 DIAGNOSIS — N189 Chronic kidney disease, unspecified: Secondary | ICD-10-CM | POA: Diagnosis not present

## 2024-04-17 DIAGNOSIS — I129 Hypertensive chronic kidney disease with stage 1 through stage 4 chronic kidney disease, or unspecified chronic kidney disease: Secondary | ICD-10-CM | POA: Diagnosis not present

## 2024-04-17 DIAGNOSIS — K5790 Diverticulosis of intestine, part unspecified, without perforation or abscess without bleeding: Secondary | ICD-10-CM | POA: Diagnosis not present

## 2024-04-17 DIAGNOSIS — E1122 Type 2 diabetes mellitus with diabetic chronic kidney disease: Secondary | ICD-10-CM | POA: Diagnosis not present

## 2024-04-17 DIAGNOSIS — L89892 Pressure ulcer of other site, stage 2: Secondary | ICD-10-CM | POA: Diagnosis not present

## 2024-04-17 DIAGNOSIS — I4891 Unspecified atrial fibrillation: Secondary | ICD-10-CM | POA: Diagnosis not present

## 2024-04-17 NOTE — Telephone Encounter (Signed)
 Patient does have history of AVR 09/2016 with [facility] Ease pericardial tissue valve as well as MV repair.  Will need antibiotic prophylaxis for dental procedures.   Okay to use amoxicillin  2 gm one hour prior to procedure.

## 2024-04-17 NOTE — Telephone Encounter (Signed)
   Name: Scott [name]  DOB: [birth year]  MRN: [id number]  Primary Cardiologist: Armida Lander, MD   Preoperative team, please contact this patient and set up a phone call appointment for further preoperative risk assessment. Please obtain consent and complete medication review. Thank you for your help.  I confirm that guidance regarding antiplatelet and oral anticoagulation therapy has been completed and, if necessary, noted below.  -Will need antibiotic prophylaxis for dental procedures. Okay to use amoxicillin  2 gm one hour prior to procedure.   -We don't recommend discontinuation of anticoagulation for single dental extractions. Would suggest INR check 1-2 days prior and arrange for INR to be at lower end of normal range.   I also confirmed the patient resides in the state of [place] . As per [facility] Medical Board telemedicine laws, the patient must reside in the state in which the provider is licensed.   Francene Ing, Retha Cast, NP 04/17/2024, 3:08 PM [facility]

## 2024-04-17 NOTE — Telephone Encounter (Signed)
 We don't recommend discontinuation of anticoagulation for single dental extractions.  Would suggest INR check 1-2 days prior and arrange for INR to be at lower end of normal range.

## 2024-04-18 ENCOUNTER — Telehealth: Payer: Self-pay

## 2024-04-18 NOTE — Telephone Encounter (Signed)
 S/W Scott [name] and got patient scheduled for TELE Preop appt 04/21/24  Med Rec and Consent done

## 2024-04-18 NOTE — Telephone Encounter (Signed)
 Med Rec and Consent done     Patient Consent for Virtual Visit        EDI Scott [name] has provided verbal consent on 04/18/2024 for a virtual visit (video or telephone).   CONSENT FOR VIRTUAL VISIT FOR:  Scott [name]  By participating in this virtual visit I agree to the following:  I hereby voluntarily request, consent and authorize [facility] and its employed or contracted physicians, physician assistants, nurse practitioners or other licensed health care professionals (the Practitioner), to provide me with telemedicine health care services (the "Services") as deemed necessary by the treating Practitioner. I acknowledge and consent to receive the Services by the Practitioner via telemedicine. I understand that the telemedicine visit will involve communicating with the Practitioner through live audiovisual communication technology and the disclosure of certain medical information by electronic transmission. I acknowledge that I have been given the opportunity to request an in-person assessment or other available alternative prior to the telemedicine visit and am voluntarily participating in the telemedicine visit.  I understand that I have the right to withhold or withdraw my consent to the use of telemedicine in the course of my care at any time, without affecting my right to future care or treatment, and that the Practitioner or I may terminate the telemedicine visit at any time. I understand that I have the right to inspect all information obtained and/or recorded in the course of the telemedicine visit and may receive copies of available information for a reasonable fee.  I understand that some of the potential risks of receiving the Services via telemedicine include:  Delay or interruption in medical evaluation due to technological equipment failure or disruption; Information transmitted may not be sufficient (e.g. poor resolution of images) to allow for appropriate medical  decision making by the Practitioner; and/or  In rare instances, security protocols could fail, causing a breach of personal health information.  Furthermore, I acknowledge that it is my responsibility to provide information about my medical history, conditions and care that is complete and accurate to the best of my ability. I acknowledge that Practitioner's advice, recommendations, and/or decision may be based on factors not within their control, such as incomplete or inaccurate data provided by me or distortions of diagnostic images or specimens that may result from electronic transmissions. I understand that the practice of medicine is not an exact science and that Practitioner makes no warranties or guarantees regarding treatment outcomes. I acknowledge that a copy of this consent can be made available to me via my patient portal [facility] MyChart), or I can request a printed copy by calling the office of  [facility].    I understand that my insurance will be billed for this visit.   I have read or had this consent read to me. I understand the contents of this consent, which adequately explains the benefits and risks of the Services being provided via telemedicine.  I have been provided ample opportunity to ask questions regarding this consent and the Services and have had my questions answered to my satisfaction. I give my informed consent for the services to be provided through the use of telemedicine in my medical care

## 2024-04-19 ENCOUNTER — Ambulatory Visit: Attending: Cardiology | Admitting: *Deleted

## 2024-04-19 DIAGNOSIS — E1165 Type 2 diabetes mellitus with hyperglycemia: Secondary | ICD-10-CM | POA: Diagnosis not present

## 2024-04-19 DIAGNOSIS — I4891 Unspecified atrial fibrillation: Secondary | ICD-10-CM | POA: Diagnosis not present

## 2024-04-19 DIAGNOSIS — Q2381 Bicuspid aortic valve: Secondary | ICD-10-CM | POA: Diagnosis not present

## 2024-04-19 DIAGNOSIS — Z9889 Other specified postprocedural states: Secondary | ICD-10-CM | POA: Diagnosis not present

## 2024-04-19 DIAGNOSIS — I82603 Acute embolism and thrombosis of unspecified veins of upper extremity, bilateral: Secondary | ICD-10-CM | POA: Diagnosis not present

## 2024-04-19 DIAGNOSIS — I9789 Other postprocedural complications and disorders of the circulatory system, not elsewhere classified: Secondary | ICD-10-CM | POA: Diagnosis not present

## 2024-04-19 LAB — POCT INR: INR: 2.5 (ref 2.0–3.0)

## 2024-04-19 NOTE — Telephone Encounter (Signed)
 Called and not answer left message for pt to call our office and ask to speak with the Preop team in regards to pt Preop clearance appt.  Pt can not to TELE Preop Appt due to not living in [place] . Patient will need IN OFFICE Preop Appt.

## 2024-04-19 NOTE — Telephone Encounter (Signed)
 Pt would like a c/b regarding sooner Tele Visit appt than 5/30 due to broken tooth and being in pain. Please advise

## 2024-04-19 NOTE — Patient Instructions (Signed)
 Continue warfarin 2 tablets daily except 1 tablet on Sundays, Tuesdays and Thursdays Recheck in 6 wks

## 2024-04-20 NOTE — Telephone Encounter (Signed)
 I have cancelled patient tele visit per Marlana Silvan, NP. Patient needs and in office visit for pre-op clearance due to living out of states per [facility] Medical Board telemedicine laws.

## 2024-04-20 NOTE — Telephone Encounter (Signed)
I left a message for the patient to call our office to schedule a tele visit for pre-op 

## 2024-04-21 ENCOUNTER — Ambulatory Visit

## 2024-04-21 DIAGNOSIS — K5790 Diverticulosis of intestine, part unspecified, without perforation or abscess without bleeding: Secondary | ICD-10-CM | POA: Diagnosis not present

## 2024-04-21 DIAGNOSIS — N400 Enlarged prostate without lower urinary tract symptoms: Secondary | ICD-10-CM | POA: Diagnosis not present

## 2024-04-21 DIAGNOSIS — I4891 Unspecified atrial fibrillation: Secondary | ICD-10-CM | POA: Diagnosis not present

## 2024-04-21 DIAGNOSIS — N189 Chronic kidney disease, unspecified: Secondary | ICD-10-CM | POA: Diagnosis not present

## 2024-04-21 DIAGNOSIS — E1140 Type 2 diabetes mellitus with diabetic neuropathy, unspecified: Secondary | ICD-10-CM | POA: Diagnosis not present

## 2024-04-21 DIAGNOSIS — I10 Essential (primary) hypertension: Secondary | ICD-10-CM | POA: Diagnosis not present

## 2024-04-21 DIAGNOSIS — I129 Hypertensive chronic kidney disease with stage 1 through stage 4 chronic kidney disease, or unspecified chronic kidney disease: Secondary | ICD-10-CM | POA: Diagnosis not present

## 2024-04-21 DIAGNOSIS — E1122 Type 2 diabetes mellitus with diabetic chronic kidney disease: Secondary | ICD-10-CM | POA: Diagnosis not present

## 2024-04-21 DIAGNOSIS — L89892 Pressure ulcer of other site, stage 2: Secondary | ICD-10-CM | POA: Diagnosis not present

## 2024-04-21 DIAGNOSIS — E782 Mixed hyperlipidemia: Secondary | ICD-10-CM | POA: Diagnosis not present

## 2024-04-24 NOTE — Telephone Encounter (Signed)
   Patient Name: Scott [name]  DOB: Mar 30, 1946 MRN: [id number]  Primary Cardiologist: Armida Lander, MD  Chart reviewed as part of pre-operative protocol coverage.  Regarding cardiac preoperative risk assessment, patient has declined office visit for evaluation.  As patient resides in [place]  we are unable to complete a telehealth visit, as such we are unable to complete a preoperative cardiac evaluation.  Patient reported his understanding and per office note from Alec Huntington, CMA continued to decline office visit.   Per our Pharmacy team, patient will need antibiotic prophylaxis for dental procedure, okay to use amoxicillin  2 g one hour prior to procedure.  Discontinuation of anticoagulation is not recommended for single dental extraction, would suggest INR check 1 to 2 days prior and arrange for INR to be at lower end of normal range, patient is followed by [facility] office for anticoagulation.   [name], NP 04/24/2024, 10:57 AM

## 2024-04-24 NOTE — Telephone Encounter (Signed)
 I s/w the pt that he needs to be schedule an appt in office due to he lives in [place], whereas the preop APP cannot practice, even a tele preop call in a state they are not licensed in. Pt understands this. Pt see's Dr. Amanda Jungling in the [place] office, though no appts in [place], [place] in a timely manner,as the pt has been in pain with the tooth that needs to be extracted. I did offer the pt an appt in the [place] office tomorrow. Pt refused and said he see's no reason why he needs to be seen. Pt tells me that the coumadin  nurse told him he could cut his dose in have for his dental procedure. I did explain to the pt that the nurse cannot give the clearance and he needs to be seen in the office. Pt was nice, but as of a matter of fact that he was not going to make an appt. Pt tells me he will take care of this himself and he will get a DDS to pull the tooth without him having coming in the office.   I stated to the pt that I will update all parties involved of our conversation today. Pt said thank you.

## 2024-04-25 DIAGNOSIS — K5790 Diverticulosis of intestine, part unspecified, without perforation or abscess without bleeding: Secondary | ICD-10-CM | POA: Diagnosis not present

## 2024-04-25 DIAGNOSIS — N189 Chronic kidney disease, unspecified: Secondary | ICD-10-CM | POA: Diagnosis not present

## 2024-04-25 DIAGNOSIS — L89892 Pressure ulcer of other site, stage 2: Secondary | ICD-10-CM | POA: Diagnosis not present

## 2024-04-25 DIAGNOSIS — I4891 Unspecified atrial fibrillation: Secondary | ICD-10-CM | POA: Diagnosis not present

## 2024-04-25 DIAGNOSIS — I129 Hypertensive chronic kidney disease with stage 1 through stage 4 chronic kidney disease, or unspecified chronic kidney disease: Secondary | ICD-10-CM | POA: Diagnosis not present

## 2024-04-25 DIAGNOSIS — E1122 Type 2 diabetes mellitus with diabetic chronic kidney disease: Secondary | ICD-10-CM | POA: Diagnosis not present

## 2024-04-27 DIAGNOSIS — M810 Age-related osteoporosis without current pathological fracture: Secondary | ICD-10-CM | POA: Diagnosis not present

## 2024-04-27 DIAGNOSIS — Z7901 Long term (current) use of anticoagulants: Secondary | ICD-10-CM | POA: Diagnosis not present

## 2024-04-27 DIAGNOSIS — M1990 Unspecified osteoarthritis, unspecified site: Secondary | ICD-10-CM | POA: Diagnosis not present

## 2024-04-27 DIAGNOSIS — Z7984 Long term (current) use of oral hypoglycemic drugs: Secondary | ICD-10-CM | POA: Diagnosis not present

## 2024-04-27 DIAGNOSIS — E7800 Pure hypercholesterolemia, unspecified: Secondary | ICD-10-CM | POA: Diagnosis not present

## 2024-04-27 DIAGNOSIS — Z89022 Acquired absence of left finger(s): Secondary | ICD-10-CM | POA: Diagnosis not present

## 2024-04-27 DIAGNOSIS — M109 Gout, unspecified: Secondary | ICD-10-CM | POA: Diagnosis not present

## 2024-04-27 DIAGNOSIS — Z89511 Acquired absence of right leg below knee: Secondary | ICD-10-CM | POA: Diagnosis not present

## 2024-04-27 DIAGNOSIS — G2581 Restless legs syndrome: Secondary | ICD-10-CM | POA: Diagnosis not present

## 2024-04-27 DIAGNOSIS — L89892 Pressure ulcer of other site, stage 2: Secondary | ICD-10-CM | POA: Diagnosis not present

## 2024-04-27 DIAGNOSIS — I129 Hypertensive chronic kidney disease with stage 1 through stage 4 chronic kidney disease, or unspecified chronic kidney disease: Secondary | ICD-10-CM | POA: Diagnosis not present

## 2024-04-27 DIAGNOSIS — E1122 Type 2 diabetes mellitus with diabetic chronic kidney disease: Secondary | ICD-10-CM | POA: Diagnosis not present

## 2024-04-27 DIAGNOSIS — Z89512 Acquired absence of left leg below knee: Secondary | ICD-10-CM | POA: Diagnosis not present

## 2024-04-27 DIAGNOSIS — K5790 Diverticulosis of intestine, part unspecified, without perforation or abscess without bleeding: Secondary | ICD-10-CM | POA: Diagnosis not present

## 2024-04-27 DIAGNOSIS — N189 Chronic kidney disease, unspecified: Secondary | ICD-10-CM | POA: Diagnosis not present

## 2024-04-27 DIAGNOSIS — B009 Herpesviral infection, unspecified: Secondary | ICD-10-CM | POA: Diagnosis not present

## 2024-04-27 DIAGNOSIS — I4891 Unspecified atrial fibrillation: Secondary | ICD-10-CM | POA: Diagnosis not present

## 2024-04-27 DIAGNOSIS — Z89021 Acquired absence of right finger(s): Secondary | ICD-10-CM | POA: Diagnosis not present

## 2024-04-27 DIAGNOSIS — N400 Enlarged prostate without lower urinary tract symptoms: Secondary | ICD-10-CM | POA: Diagnosis not present

## 2024-04-27 DIAGNOSIS — F1090 Alcohol use, unspecified, uncomplicated: Secondary | ICD-10-CM | POA: Diagnosis not present

## 2024-04-28 DIAGNOSIS — L89892 Pressure ulcer of other site, stage 2: Secondary | ICD-10-CM | POA: Diagnosis not present

## 2024-04-28 DIAGNOSIS — K5790 Diverticulosis of intestine, part unspecified, without perforation or abscess without bleeding: Secondary | ICD-10-CM | POA: Diagnosis not present

## 2024-04-28 DIAGNOSIS — N189 Chronic kidney disease, unspecified: Secondary | ICD-10-CM | POA: Diagnosis not present

## 2024-04-28 DIAGNOSIS — E1122 Type 2 diabetes mellitus with diabetic chronic kidney disease: Secondary | ICD-10-CM | POA: Diagnosis not present

## 2024-04-28 DIAGNOSIS — I129 Hypertensive chronic kidney disease with stage 1 through stage 4 chronic kidney disease, or unspecified chronic kidney disease: Secondary | ICD-10-CM | POA: Diagnosis not present

## 2024-04-28 DIAGNOSIS — I4891 Unspecified atrial fibrillation: Secondary | ICD-10-CM | POA: Diagnosis not present

## 2024-05-01 DIAGNOSIS — I4891 Unspecified atrial fibrillation: Secondary | ICD-10-CM | POA: Diagnosis not present

## 2024-05-01 DIAGNOSIS — K5790 Diverticulosis of intestine, part unspecified, without perforation or abscess without bleeding: Secondary | ICD-10-CM | POA: Diagnosis not present

## 2024-05-01 DIAGNOSIS — L89892 Pressure ulcer of other site, stage 2: Secondary | ICD-10-CM | POA: Diagnosis not present

## 2024-05-01 DIAGNOSIS — I129 Hypertensive chronic kidney disease with stage 1 through stage 4 chronic kidney disease, or unspecified chronic kidney disease: Secondary | ICD-10-CM | POA: Diagnosis not present

## 2024-05-01 DIAGNOSIS — N189 Chronic kidney disease, unspecified: Secondary | ICD-10-CM | POA: Diagnosis not present

## 2024-05-01 DIAGNOSIS — E1122 Type 2 diabetes mellitus with diabetic chronic kidney disease: Secondary | ICD-10-CM | POA: Diagnosis not present

## 2024-05-04 DIAGNOSIS — E1122 Type 2 diabetes mellitus with diabetic chronic kidney disease: Secondary | ICD-10-CM | POA: Diagnosis not present

## 2024-05-04 DIAGNOSIS — K5790 Diverticulosis of intestine, part unspecified, without perforation or abscess without bleeding: Secondary | ICD-10-CM | POA: Diagnosis not present

## 2024-05-04 DIAGNOSIS — N189 Chronic kidney disease, unspecified: Secondary | ICD-10-CM | POA: Diagnosis not present

## 2024-05-04 DIAGNOSIS — I129 Hypertensive chronic kidney disease with stage 1 through stage 4 chronic kidney disease, or unspecified chronic kidney disease: Secondary | ICD-10-CM | POA: Diagnosis not present

## 2024-05-04 DIAGNOSIS — I4891 Unspecified atrial fibrillation: Secondary | ICD-10-CM | POA: Diagnosis not present

## 2024-05-04 DIAGNOSIS — L89892 Pressure ulcer of other site, stage 2: Secondary | ICD-10-CM | POA: Diagnosis not present

## 2024-05-08 DIAGNOSIS — K5790 Diverticulosis of intestine, part unspecified, without perforation or abscess without bleeding: Secondary | ICD-10-CM | POA: Diagnosis not present

## 2024-05-08 DIAGNOSIS — E1122 Type 2 diabetes mellitus with diabetic chronic kidney disease: Secondary | ICD-10-CM | POA: Diagnosis not present

## 2024-05-08 DIAGNOSIS — N189 Chronic kidney disease, unspecified: Secondary | ICD-10-CM | POA: Diagnosis not present

## 2024-05-08 DIAGNOSIS — I129 Hypertensive chronic kidney disease with stage 1 through stage 4 chronic kidney disease, or unspecified chronic kidney disease: Secondary | ICD-10-CM | POA: Diagnosis not present

## 2024-05-08 DIAGNOSIS — I4891 Unspecified atrial fibrillation: Secondary | ICD-10-CM | POA: Diagnosis not present

## 2024-05-08 DIAGNOSIS — L89892 Pressure ulcer of other site, stage 2: Secondary | ICD-10-CM | POA: Diagnosis not present

## 2024-05-11 DIAGNOSIS — G2581 Restless legs syndrome: Secondary | ICD-10-CM | POA: Diagnosis not present

## 2024-05-11 DIAGNOSIS — T7840XA Allergy, unspecified, initial encounter: Secondary | ICD-10-CM | POA: Diagnosis not present

## 2024-05-11 DIAGNOSIS — E119 Type 2 diabetes mellitus without complications: Secondary | ICD-10-CM | POA: Diagnosis not present

## 2024-05-11 DIAGNOSIS — S81002A Unspecified open wound, left knee, initial encounter: Secondary | ICD-10-CM | POA: Diagnosis not present

## 2024-05-11 DIAGNOSIS — Z5189 Encounter for other specified aftercare: Secondary | ICD-10-CM | POA: Diagnosis not present

## 2024-05-12 DIAGNOSIS — L89892 Pressure ulcer of other site, stage 2: Secondary | ICD-10-CM | POA: Diagnosis not present

## 2024-05-12 DIAGNOSIS — N189 Chronic kidney disease, unspecified: Secondary | ICD-10-CM | POA: Diagnosis not present

## 2024-05-12 DIAGNOSIS — E1122 Type 2 diabetes mellitus with diabetic chronic kidney disease: Secondary | ICD-10-CM | POA: Diagnosis not present

## 2024-05-12 DIAGNOSIS — I129 Hypertensive chronic kidney disease with stage 1 through stage 4 chronic kidney disease, or unspecified chronic kidney disease: Secondary | ICD-10-CM | POA: Diagnosis not present

## 2024-05-12 DIAGNOSIS — I4891 Unspecified atrial fibrillation: Secondary | ICD-10-CM | POA: Diagnosis not present

## 2024-05-12 DIAGNOSIS — K5790 Diverticulosis of intestine, part unspecified, without perforation or abscess without bleeding: Secondary | ICD-10-CM | POA: Diagnosis not present

## 2024-05-18 DIAGNOSIS — S88112D Complete traumatic amputation at level between knee and ankle, left lower leg, subsequent encounter: Secondary | ICD-10-CM | POA: Diagnosis not present

## 2024-05-18 DIAGNOSIS — S88111D Complete traumatic amputation at level between knee and ankle, right lower leg, subsequent encounter: Secondary | ICD-10-CM | POA: Diagnosis not present

## 2024-05-22 ENCOUNTER — Other Ambulatory Visit: Payer: Self-pay | Admitting: Cardiology

## 2024-05-22 DIAGNOSIS — N400 Enlarged prostate without lower urinary tract symptoms: Secondary | ICD-10-CM | POA: Diagnosis not present

## 2024-05-22 DIAGNOSIS — E1140 Type 2 diabetes mellitus with diabetic neuropathy, unspecified: Secondary | ICD-10-CM | POA: Diagnosis not present

## 2024-05-22 DIAGNOSIS — I10 Essential (primary) hypertension: Secondary | ICD-10-CM | POA: Diagnosis not present

## 2024-05-22 DIAGNOSIS — E782 Mixed hyperlipidemia: Secondary | ICD-10-CM | POA: Diagnosis not present

## 2024-05-23 DIAGNOSIS — I129 Hypertensive chronic kidney disease with stage 1 through stage 4 chronic kidney disease, or unspecified chronic kidney disease: Secondary | ICD-10-CM | POA: Diagnosis not present

## 2024-05-23 DIAGNOSIS — I4891 Unspecified atrial fibrillation: Secondary | ICD-10-CM | POA: Diagnosis not present

## 2024-05-23 DIAGNOSIS — L89892 Pressure ulcer of other site, stage 2: Secondary | ICD-10-CM | POA: Diagnosis not present

## 2024-05-23 DIAGNOSIS — E1122 Type 2 diabetes mellitus with diabetic chronic kidney disease: Secondary | ICD-10-CM | POA: Diagnosis not present

## 2024-05-23 DIAGNOSIS — N189 Chronic kidney disease, unspecified: Secondary | ICD-10-CM | POA: Diagnosis not present

## 2024-05-23 DIAGNOSIS — K5790 Diverticulosis of intestine, part unspecified, without perforation or abscess without bleeding: Secondary | ICD-10-CM | POA: Diagnosis not present

## 2024-05-25 DIAGNOSIS — S88112D Complete traumatic amputation at level between knee and ankle, left lower leg, subsequent encounter: Secondary | ICD-10-CM | POA: Diagnosis not present

## 2024-05-25 DIAGNOSIS — S81002D Unspecified open wound, left knee, subsequent encounter: Secondary | ICD-10-CM | POA: Diagnosis not present

## 2024-05-25 DIAGNOSIS — S88111D Complete traumatic amputation at level between knee and ankle, right lower leg, subsequent encounter: Secondary | ICD-10-CM | POA: Diagnosis not present

## 2024-05-27 DIAGNOSIS — Z7984 Long term (current) use of oral hypoglycemic drugs: Secondary | ICD-10-CM | POA: Diagnosis not present

## 2024-05-27 DIAGNOSIS — Z89512 Acquired absence of left leg below knee: Secondary | ICD-10-CM | POA: Diagnosis not present

## 2024-05-27 DIAGNOSIS — F1090 Alcohol use, unspecified, uncomplicated: Secondary | ICD-10-CM | POA: Diagnosis not present

## 2024-05-27 DIAGNOSIS — B009 Herpesviral infection, unspecified: Secondary | ICD-10-CM | POA: Diagnosis not present

## 2024-05-27 DIAGNOSIS — L89892 Pressure ulcer of other site, stage 2: Secondary | ICD-10-CM | POA: Diagnosis not present

## 2024-05-27 DIAGNOSIS — N189 Chronic kidney disease, unspecified: Secondary | ICD-10-CM | POA: Diagnosis not present

## 2024-05-27 DIAGNOSIS — M109 Gout, unspecified: Secondary | ICD-10-CM | POA: Diagnosis not present

## 2024-05-27 DIAGNOSIS — G2581 Restless legs syndrome: Secondary | ICD-10-CM | POA: Diagnosis not present

## 2024-05-27 DIAGNOSIS — E7800 Pure hypercholesterolemia, unspecified: Secondary | ICD-10-CM | POA: Diagnosis not present

## 2024-05-27 DIAGNOSIS — M810 Age-related osteoporosis without current pathological fracture: Secondary | ICD-10-CM | POA: Diagnosis not present

## 2024-05-27 DIAGNOSIS — N400 Enlarged prostate without lower urinary tract symptoms: Secondary | ICD-10-CM | POA: Diagnosis not present

## 2024-05-27 DIAGNOSIS — Z89021 Acquired absence of right finger(s): Secondary | ICD-10-CM | POA: Diagnosis not present

## 2024-05-27 DIAGNOSIS — I4891 Unspecified atrial fibrillation: Secondary | ICD-10-CM | POA: Diagnosis not present

## 2024-05-27 DIAGNOSIS — Z89022 Acquired absence of left finger(s): Secondary | ICD-10-CM | POA: Diagnosis not present

## 2024-05-27 DIAGNOSIS — Z7901 Long term (current) use of anticoagulants: Secondary | ICD-10-CM | POA: Diagnosis not present

## 2024-05-27 DIAGNOSIS — Z89511 Acquired absence of right leg below knee: Secondary | ICD-10-CM | POA: Diagnosis not present

## 2024-05-27 DIAGNOSIS — K5790 Diverticulosis of intestine, part unspecified, without perforation or abscess without bleeding: Secondary | ICD-10-CM | POA: Diagnosis not present

## 2024-05-27 DIAGNOSIS — I129 Hypertensive chronic kidney disease with stage 1 through stage 4 chronic kidney disease, or unspecified chronic kidney disease: Secondary | ICD-10-CM | POA: Diagnosis not present

## 2024-05-27 DIAGNOSIS — E1122 Type 2 diabetes mellitus with diabetic chronic kidney disease: Secondary | ICD-10-CM | POA: Diagnosis not present

## 2024-05-27 DIAGNOSIS — M1990 Unspecified osteoarthritis, unspecified site: Secondary | ICD-10-CM | POA: Diagnosis not present

## 2024-05-31 ENCOUNTER — Ambulatory Visit: Attending: Cardiology | Admitting: *Deleted

## 2024-05-31 DIAGNOSIS — I82603 Acute embolism and thrombosis of unspecified veins of upper extremity, bilateral: Secondary | ICD-10-CM | POA: Insufficient documentation

## 2024-05-31 DIAGNOSIS — Q2381 Bicuspid aortic valve: Secondary | ICD-10-CM | POA: Diagnosis not present

## 2024-05-31 DIAGNOSIS — Z9889 Other specified postprocedural states: Secondary | ICD-10-CM | POA: Diagnosis not present

## 2024-05-31 DIAGNOSIS — I4891 Unspecified atrial fibrillation: Secondary | ICD-10-CM | POA: Diagnosis not present

## 2024-05-31 DIAGNOSIS — I9789 Other postprocedural complications and disorders of the circulatory system, not elsewhere classified: Secondary | ICD-10-CM | POA: Insufficient documentation

## 2024-05-31 LAB — POCT INR: INR: 2.8 (ref 2.0–3.0)

## 2024-05-31 NOTE — Patient Instructions (Signed)
 Continue warfarin 2 tablets daily except 1 tablet on Sundays, Tuesdays and Thursdays Recheck in 6 wks

## 2024-05-31 NOTE — Progress Notes (Signed)
Please see anticoagulation encounter.

## 2024-06-01 DIAGNOSIS — S88111D Complete traumatic amputation at level between knee and ankle, right lower leg, subsequent encounter: Secondary | ICD-10-CM | POA: Diagnosis not present

## 2024-06-01 DIAGNOSIS — S88112D Complete traumatic amputation at level between knee and ankle, left lower leg, subsequent encounter: Secondary | ICD-10-CM | POA: Diagnosis not present

## 2024-06-01 DIAGNOSIS — L89893 Pressure ulcer of other site, stage 3: Secondary | ICD-10-CM | POA: Diagnosis not present

## 2024-06-02 DIAGNOSIS — I4891 Unspecified atrial fibrillation: Secondary | ICD-10-CM | POA: Diagnosis not present

## 2024-06-02 DIAGNOSIS — L89892 Pressure ulcer of other site, stage 2: Secondary | ICD-10-CM | POA: Diagnosis not present

## 2024-06-02 DIAGNOSIS — I129 Hypertensive chronic kidney disease with stage 1 through stage 4 chronic kidney disease, or unspecified chronic kidney disease: Secondary | ICD-10-CM | POA: Diagnosis not present

## 2024-06-02 DIAGNOSIS — K5790 Diverticulosis of intestine, part unspecified, without perforation or abscess without bleeding: Secondary | ICD-10-CM | POA: Diagnosis not present

## 2024-06-02 DIAGNOSIS — E1122 Type 2 diabetes mellitus with diabetic chronic kidney disease: Secondary | ICD-10-CM | POA: Diagnosis not present

## 2024-06-02 DIAGNOSIS — N189 Chronic kidney disease, unspecified: Secondary | ICD-10-CM | POA: Diagnosis not present

## 2024-06-07 DIAGNOSIS — I129 Hypertensive chronic kidney disease with stage 1 through stage 4 chronic kidney disease, or unspecified chronic kidney disease: Secondary | ICD-10-CM | POA: Diagnosis not present

## 2024-06-07 DIAGNOSIS — N189 Chronic kidney disease, unspecified: Secondary | ICD-10-CM | POA: Diagnosis not present

## 2024-06-07 DIAGNOSIS — E1122 Type 2 diabetes mellitus with diabetic chronic kidney disease: Secondary | ICD-10-CM | POA: Diagnosis not present

## 2024-06-07 DIAGNOSIS — K5790 Diverticulosis of intestine, part unspecified, without perforation or abscess without bleeding: Secondary | ICD-10-CM | POA: Diagnosis not present

## 2024-06-07 DIAGNOSIS — L89892 Pressure ulcer of other site, stage 2: Secondary | ICD-10-CM | POA: Diagnosis not present

## 2024-06-07 DIAGNOSIS — I4891 Unspecified atrial fibrillation: Secondary | ICD-10-CM | POA: Diagnosis not present

## 2024-06-08 DIAGNOSIS — S88112D Complete traumatic amputation at level between knee and ankle, left lower leg, subsequent encounter: Secondary | ICD-10-CM | POA: Diagnosis not present

## 2024-06-08 DIAGNOSIS — S88111D Complete traumatic amputation at level between knee and ankle, right lower leg, subsequent encounter: Secondary | ICD-10-CM | POA: Diagnosis not present

## 2024-06-08 DIAGNOSIS — L89893 Pressure ulcer of other site, stage 3: Secondary | ICD-10-CM | POA: Diagnosis not present

## 2024-06-13 DIAGNOSIS — K5790 Diverticulosis of intestine, part unspecified, without perforation or abscess without bleeding: Secondary | ICD-10-CM | POA: Diagnosis not present

## 2024-06-13 DIAGNOSIS — E1122 Type 2 diabetes mellitus with diabetic chronic kidney disease: Secondary | ICD-10-CM | POA: Diagnosis not present

## 2024-06-13 DIAGNOSIS — N189 Chronic kidney disease, unspecified: Secondary | ICD-10-CM | POA: Diagnosis not present

## 2024-06-13 DIAGNOSIS — I129 Hypertensive chronic kidney disease with stage 1 through stage 4 chronic kidney disease, or unspecified chronic kidney disease: Secondary | ICD-10-CM | POA: Diagnosis not present

## 2024-06-13 DIAGNOSIS — I4891 Unspecified atrial fibrillation: Secondary | ICD-10-CM | POA: Diagnosis not present

## 2024-06-13 DIAGNOSIS — L89892 Pressure ulcer of other site, stage 2: Secondary | ICD-10-CM | POA: Diagnosis not present

## 2024-06-15 ENCOUNTER — Other Ambulatory Visit: Payer: Self-pay | Admitting: Cardiology

## 2024-06-15 DIAGNOSIS — S81002A Unspecified open wound, left knee, initial encounter: Secondary | ICD-10-CM | POA: Diagnosis not present

## 2024-06-15 DIAGNOSIS — S88111D Complete traumatic amputation at level between knee and ankle, right lower leg, subsequent encounter: Secondary | ICD-10-CM | POA: Diagnosis not present

## 2024-06-15 DIAGNOSIS — S88112D Complete traumatic amputation at level between knee and ankle, left lower leg, subsequent encounter: Secondary | ICD-10-CM | POA: Diagnosis not present

## 2024-06-15 DIAGNOSIS — R197 Diarrhea, unspecified: Secondary | ICD-10-CM | POA: Diagnosis not present

## 2024-06-21 DIAGNOSIS — L89892 Pressure ulcer of other site, stage 2: Secondary | ICD-10-CM | POA: Diagnosis not present

## 2024-06-21 DIAGNOSIS — K5790 Diverticulosis of intestine, part unspecified, without perforation or abscess without bleeding: Secondary | ICD-10-CM | POA: Diagnosis not present

## 2024-06-21 DIAGNOSIS — N189 Chronic kidney disease, unspecified: Secondary | ICD-10-CM | POA: Diagnosis not present

## 2024-06-21 DIAGNOSIS — E1122 Type 2 diabetes mellitus with diabetic chronic kidney disease: Secondary | ICD-10-CM | POA: Diagnosis not present

## 2024-06-21 DIAGNOSIS — I129 Hypertensive chronic kidney disease with stage 1 through stage 4 chronic kidney disease, or unspecified chronic kidney disease: Secondary | ICD-10-CM | POA: Diagnosis not present

## 2024-06-21 DIAGNOSIS — I4891 Unspecified atrial fibrillation: Secondary | ICD-10-CM | POA: Diagnosis not present

## 2024-06-22 DIAGNOSIS — E1140 Type 2 diabetes mellitus with diabetic neuropathy, unspecified: Secondary | ICD-10-CM | POA: Diagnosis not present

## 2024-06-22 DIAGNOSIS — S81002A Unspecified open wound, left knee, initial encounter: Secondary | ICD-10-CM | POA: Diagnosis not present

## 2024-06-22 DIAGNOSIS — E782 Mixed hyperlipidemia: Secondary | ICD-10-CM | POA: Diagnosis not present

## 2024-06-22 DIAGNOSIS — N400 Enlarged prostate without lower urinary tract symptoms: Secondary | ICD-10-CM | POA: Diagnosis not present

## 2024-06-22 DIAGNOSIS — I10 Essential (primary) hypertension: Secondary | ICD-10-CM | POA: Diagnosis not present

## 2024-06-26 DIAGNOSIS — Z7901 Long term (current) use of anticoagulants: Secondary | ICD-10-CM | POA: Diagnosis not present

## 2024-06-26 DIAGNOSIS — E1122 Type 2 diabetes mellitus with diabetic chronic kidney disease: Secondary | ICD-10-CM | POA: Diagnosis not present

## 2024-06-26 DIAGNOSIS — N400 Enlarged prostate without lower urinary tract symptoms: Secondary | ICD-10-CM | POA: Diagnosis not present

## 2024-06-26 DIAGNOSIS — M810 Age-related osteoporosis without current pathological fracture: Secondary | ICD-10-CM | POA: Diagnosis not present

## 2024-06-26 DIAGNOSIS — I4891 Unspecified atrial fibrillation: Secondary | ICD-10-CM | POA: Diagnosis not present

## 2024-06-26 DIAGNOSIS — M109 Gout, unspecified: Secondary | ICD-10-CM | POA: Diagnosis not present

## 2024-06-26 DIAGNOSIS — Z7984 Long term (current) use of oral hypoglycemic drugs: Secondary | ICD-10-CM | POA: Diagnosis not present

## 2024-06-26 DIAGNOSIS — L89892 Pressure ulcer of other site, stage 2: Secondary | ICD-10-CM | POA: Diagnosis not present

## 2024-06-26 DIAGNOSIS — Z89022 Acquired absence of left finger(s): Secondary | ICD-10-CM | POA: Diagnosis not present

## 2024-06-26 DIAGNOSIS — I129 Hypertensive chronic kidney disease with stage 1 through stage 4 chronic kidney disease, or unspecified chronic kidney disease: Secondary | ICD-10-CM | POA: Diagnosis not present

## 2024-06-26 DIAGNOSIS — K5790 Diverticulosis of intestine, part unspecified, without perforation or abscess without bleeding: Secondary | ICD-10-CM | POA: Diagnosis not present

## 2024-06-26 DIAGNOSIS — M1990 Unspecified osteoarthritis, unspecified site: Secondary | ICD-10-CM | POA: Diagnosis not present

## 2024-06-26 DIAGNOSIS — Z89511 Acquired absence of right leg below knee: Secondary | ICD-10-CM | POA: Diagnosis not present

## 2024-06-26 DIAGNOSIS — Z89021 Acquired absence of right finger(s): Secondary | ICD-10-CM | POA: Diagnosis not present

## 2024-06-26 DIAGNOSIS — N189 Chronic kidney disease, unspecified: Secondary | ICD-10-CM | POA: Diagnosis not present

## 2024-06-26 DIAGNOSIS — F1090 Alcohol use, unspecified, uncomplicated: Secondary | ICD-10-CM | POA: Diagnosis not present

## 2024-06-26 DIAGNOSIS — B009 Herpesviral infection, unspecified: Secondary | ICD-10-CM | POA: Diagnosis not present

## 2024-06-26 DIAGNOSIS — Z89512 Acquired absence of left leg below knee: Secondary | ICD-10-CM | POA: Diagnosis not present

## 2024-06-26 DIAGNOSIS — E7800 Pure hypercholesterolemia, unspecified: Secondary | ICD-10-CM | POA: Diagnosis not present

## 2024-06-26 DIAGNOSIS — G2581 Restless legs syndrome: Secondary | ICD-10-CM | POA: Diagnosis not present

## 2024-06-27 DIAGNOSIS — I4891 Unspecified atrial fibrillation: Secondary | ICD-10-CM | POA: Diagnosis not present

## 2024-06-27 DIAGNOSIS — K5790 Diverticulosis of intestine, part unspecified, without perforation or abscess without bleeding: Secondary | ICD-10-CM | POA: Diagnosis not present

## 2024-06-27 DIAGNOSIS — E1122 Type 2 diabetes mellitus with diabetic chronic kidney disease: Secondary | ICD-10-CM | POA: Diagnosis not present

## 2024-06-27 DIAGNOSIS — I129 Hypertensive chronic kidney disease with stage 1 through stage 4 chronic kidney disease, or unspecified chronic kidney disease: Secondary | ICD-10-CM | POA: Diagnosis not present

## 2024-06-27 DIAGNOSIS — L89892 Pressure ulcer of other site, stage 2: Secondary | ICD-10-CM | POA: Diagnosis not present

## 2024-06-27 DIAGNOSIS — N189 Chronic kidney disease, unspecified: Secondary | ICD-10-CM | POA: Diagnosis not present

## 2024-06-29 DIAGNOSIS — S81002D Unspecified open wound, left knee, subsequent encounter: Secondary | ICD-10-CM | POA: Diagnosis not present

## 2024-07-01 ENCOUNTER — Other Ambulatory Visit: Payer: Self-pay | Admitting: Cardiology

## 2024-07-02 ENCOUNTER — Other Ambulatory Visit: Payer: Self-pay | Admitting: Cardiology

## 2024-07-03 DIAGNOSIS — I4891 Unspecified atrial fibrillation: Secondary | ICD-10-CM | POA: Diagnosis not present

## 2024-07-03 DIAGNOSIS — I129 Hypertensive chronic kidney disease with stage 1 through stage 4 chronic kidney disease, or unspecified chronic kidney disease: Secondary | ICD-10-CM | POA: Diagnosis not present

## 2024-07-03 DIAGNOSIS — K5790 Diverticulosis of intestine, part unspecified, without perforation or abscess without bleeding: Secondary | ICD-10-CM | POA: Diagnosis not present

## 2024-07-03 DIAGNOSIS — N189 Chronic kidney disease, unspecified: Secondary | ICD-10-CM | POA: Diagnosis not present

## 2024-07-03 DIAGNOSIS — E1122 Type 2 diabetes mellitus with diabetic chronic kidney disease: Secondary | ICD-10-CM | POA: Diagnosis not present

## 2024-07-03 DIAGNOSIS — L89892 Pressure ulcer of other site, stage 2: Secondary | ICD-10-CM | POA: Diagnosis not present

## 2024-07-06 DIAGNOSIS — L89893 Pressure ulcer of other site, stage 3: Secondary | ICD-10-CM | POA: Diagnosis not present

## 2024-07-06 DIAGNOSIS — S88111D Complete traumatic amputation at level between knee and ankle, right lower leg, subsequent encounter: Secondary | ICD-10-CM | POA: Diagnosis not present

## 2024-07-06 DIAGNOSIS — S88112D Complete traumatic amputation at level between knee and ankle, left lower leg, subsequent encounter: Secondary | ICD-10-CM | POA: Diagnosis not present

## 2024-07-11 DIAGNOSIS — I10 Essential (primary) hypertension: Secondary | ICD-10-CM | POA: Diagnosis not present

## 2024-07-11 DIAGNOSIS — E1165 Type 2 diabetes mellitus with hyperglycemia: Secondary | ICD-10-CM | POA: Diagnosis not present

## 2024-07-11 DIAGNOSIS — E1140 Type 2 diabetes mellitus with diabetic neuropathy, unspecified: Secondary | ICD-10-CM | POA: Diagnosis not present

## 2024-07-11 DIAGNOSIS — E7801 Familial hypercholesterolemia: Secondary | ICD-10-CM | POA: Diagnosis not present

## 2024-07-11 DIAGNOSIS — E7849 Other hyperlipidemia: Secondary | ICD-10-CM | POA: Diagnosis not present

## 2024-07-12 ENCOUNTER — Ambulatory Visit: Attending: Cardiology | Admitting: *Deleted

## 2024-07-12 DIAGNOSIS — I4891 Unspecified atrial fibrillation: Secondary | ICD-10-CM | POA: Insufficient documentation

## 2024-07-12 DIAGNOSIS — Q2381 Bicuspid aortic valve: Secondary | ICD-10-CM | POA: Diagnosis not present

## 2024-07-12 DIAGNOSIS — I9789 Other postprocedural complications and disorders of the circulatory system, not elsewhere classified: Secondary | ICD-10-CM | POA: Insufficient documentation

## 2024-07-12 DIAGNOSIS — I82603 Acute embolism and thrombosis of unspecified veins of upper extremity, bilateral: Secondary | ICD-10-CM | POA: Insufficient documentation

## 2024-07-12 DIAGNOSIS — Z9889 Other specified postprocedural states: Secondary | ICD-10-CM | POA: Insufficient documentation

## 2024-07-12 LAB — POCT INR: INR: 2.6 (ref 2.0–3.0)

## 2024-07-12 NOTE — Patient Instructions (Signed)
 Continue warfarin 2 tablets daily except 1 tablet on Sundays, Tuesdays and Thursdays Recheck in 6 wks

## 2024-07-12 NOTE — Progress Notes (Signed)
 INR 2.6; Please see anticoagulation encounter

## 2024-07-13 DIAGNOSIS — I10 Essential (primary) hypertension: Secondary | ICD-10-CM | POA: Diagnosis not present

## 2024-07-13 DIAGNOSIS — S88112D Complete traumatic amputation at level between knee and ankle, left lower leg, subsequent encounter: Secondary | ICD-10-CM | POA: Diagnosis not present

## 2024-07-13 DIAGNOSIS — E1121 Type 2 diabetes mellitus with diabetic nephropathy: Secondary | ICD-10-CM | POA: Diagnosis not present

## 2024-07-13 DIAGNOSIS — L89893 Pressure ulcer of other site, stage 3: Secondary | ICD-10-CM | POA: Diagnosis not present

## 2024-07-13 DIAGNOSIS — S88111D Complete traumatic amputation at level between knee and ankle, right lower leg, subsequent encounter: Secondary | ICD-10-CM | POA: Diagnosis not present

## 2024-07-17 DIAGNOSIS — N189 Chronic kidney disease, unspecified: Secondary | ICD-10-CM | POA: Diagnosis not present

## 2024-07-17 DIAGNOSIS — E1122 Type 2 diabetes mellitus with diabetic chronic kidney disease: Secondary | ICD-10-CM | POA: Diagnosis not present

## 2024-07-17 DIAGNOSIS — I4891 Unspecified atrial fibrillation: Secondary | ICD-10-CM | POA: Diagnosis not present

## 2024-07-17 DIAGNOSIS — K5790 Diverticulosis of intestine, part unspecified, without perforation or abscess without bleeding: Secondary | ICD-10-CM | POA: Diagnosis not present

## 2024-07-17 DIAGNOSIS — L89892 Pressure ulcer of other site, stage 2: Secondary | ICD-10-CM | POA: Diagnosis not present

## 2024-07-17 DIAGNOSIS — I129 Hypertensive chronic kidney disease with stage 1 through stage 4 chronic kidney disease, or unspecified chronic kidney disease: Secondary | ICD-10-CM | POA: Diagnosis not present

## 2024-07-18 DIAGNOSIS — M810 Age-related osteoporosis without current pathological fracture: Secondary | ICD-10-CM | POA: Diagnosis not present

## 2024-07-18 DIAGNOSIS — E782 Mixed hyperlipidemia: Secondary | ICD-10-CM | POA: Diagnosis not present

## 2024-07-18 DIAGNOSIS — E1140 Type 2 diabetes mellitus with diabetic neuropathy, unspecified: Secondary | ICD-10-CM | POA: Diagnosis not present

## 2024-07-18 DIAGNOSIS — I10 Essential (primary) hypertension: Secondary | ICD-10-CM | POA: Diagnosis not present

## 2024-07-18 DIAGNOSIS — E7849 Other hyperlipidemia: Secondary | ICD-10-CM | POA: Diagnosis not present

## 2024-07-18 DIAGNOSIS — Z6822 Body mass index (BMI) 22.0-22.9, adult: Secondary | ICD-10-CM | POA: Diagnosis not present

## 2024-07-20 DIAGNOSIS — S88111D Complete traumatic amputation at level between knee and ankle, right lower leg, subsequent encounter: Secondary | ICD-10-CM | POA: Diagnosis not present

## 2024-07-20 DIAGNOSIS — E1121 Type 2 diabetes mellitus with diabetic nephropathy: Secondary | ICD-10-CM | POA: Diagnosis not present

## 2024-07-20 DIAGNOSIS — L089 Local infection of the skin and subcutaneous tissue, unspecified: Secondary | ICD-10-CM | POA: Diagnosis not present

## 2024-07-20 DIAGNOSIS — L89893 Pressure ulcer of other site, stage 3: Secondary | ICD-10-CM | POA: Diagnosis not present

## 2024-07-20 DIAGNOSIS — T148XXA Other injury of unspecified body region, initial encounter: Secondary | ICD-10-CM | POA: Diagnosis not present

## 2024-07-20 DIAGNOSIS — S88112D Complete traumatic amputation at level between knee and ankle, left lower leg, subsequent encounter: Secondary | ICD-10-CM | POA: Diagnosis not present

## 2024-07-20 DIAGNOSIS — I10 Essential (primary) hypertension: Secondary | ICD-10-CM | POA: Diagnosis not present

## 2024-07-21 DIAGNOSIS — E782 Mixed hyperlipidemia: Secondary | ICD-10-CM | POA: Diagnosis not present

## 2024-07-21 DIAGNOSIS — N400 Enlarged prostate without lower urinary tract symptoms: Secondary | ICD-10-CM | POA: Diagnosis not present

## 2024-07-21 DIAGNOSIS — E1140 Type 2 diabetes mellitus with diabetic neuropathy, unspecified: Secondary | ICD-10-CM | POA: Diagnosis not present

## 2024-07-21 DIAGNOSIS — I10 Essential (primary) hypertension: Secondary | ICD-10-CM | POA: Diagnosis not present

## 2024-07-25 DIAGNOSIS — K5790 Diverticulosis of intestine, part unspecified, without perforation or abscess without bleeding: Secondary | ICD-10-CM | POA: Diagnosis not present

## 2024-07-25 DIAGNOSIS — I129 Hypertensive chronic kidney disease with stage 1 through stage 4 chronic kidney disease, or unspecified chronic kidney disease: Secondary | ICD-10-CM | POA: Diagnosis not present

## 2024-07-25 DIAGNOSIS — I4891 Unspecified atrial fibrillation: Secondary | ICD-10-CM | POA: Diagnosis not present

## 2024-07-25 DIAGNOSIS — N189 Chronic kidney disease, unspecified: Secondary | ICD-10-CM | POA: Diagnosis not present

## 2024-07-25 DIAGNOSIS — E1122 Type 2 diabetes mellitus with diabetic chronic kidney disease: Secondary | ICD-10-CM | POA: Diagnosis not present

## 2024-07-25 DIAGNOSIS — L89892 Pressure ulcer of other site, stage 2: Secondary | ICD-10-CM | POA: Diagnosis not present

## 2024-07-26 DIAGNOSIS — M810 Age-related osteoporosis without current pathological fracture: Secondary | ICD-10-CM | POA: Diagnosis not present

## 2024-07-26 DIAGNOSIS — Z89021 Acquired absence of right finger(s): Secondary | ICD-10-CM | POA: Diagnosis not present

## 2024-07-26 DIAGNOSIS — F1090 Alcohol use, unspecified, uncomplicated: Secondary | ICD-10-CM | POA: Diagnosis not present

## 2024-07-26 DIAGNOSIS — Z89512 Acquired absence of left leg below knee: Secondary | ICD-10-CM | POA: Diagnosis not present

## 2024-07-26 DIAGNOSIS — G2581 Restless legs syndrome: Secondary | ICD-10-CM | POA: Diagnosis not present

## 2024-07-26 DIAGNOSIS — B009 Herpesviral infection, unspecified: Secondary | ICD-10-CM | POA: Diagnosis not present

## 2024-07-26 DIAGNOSIS — Z89511 Acquired absence of right leg below knee: Secondary | ICD-10-CM | POA: Diagnosis not present

## 2024-07-26 DIAGNOSIS — Z7984 Long term (current) use of oral hypoglycemic drugs: Secondary | ICD-10-CM | POA: Diagnosis not present

## 2024-07-26 DIAGNOSIS — I129 Hypertensive chronic kidney disease with stage 1 through stage 4 chronic kidney disease, or unspecified chronic kidney disease: Secondary | ICD-10-CM | POA: Diagnosis not present

## 2024-07-26 DIAGNOSIS — N189 Chronic kidney disease, unspecified: Secondary | ICD-10-CM | POA: Diagnosis not present

## 2024-07-26 DIAGNOSIS — M109 Gout, unspecified: Secondary | ICD-10-CM | POA: Diagnosis not present

## 2024-07-26 DIAGNOSIS — K5790 Diverticulosis of intestine, part unspecified, without perforation or abscess without bleeding: Secondary | ICD-10-CM | POA: Diagnosis not present

## 2024-07-26 DIAGNOSIS — N400 Enlarged prostate without lower urinary tract symptoms: Secondary | ICD-10-CM | POA: Diagnosis not present

## 2024-07-26 DIAGNOSIS — E7800 Pure hypercholesterolemia, unspecified: Secondary | ICD-10-CM | POA: Diagnosis not present

## 2024-07-26 DIAGNOSIS — E1122 Type 2 diabetes mellitus with diabetic chronic kidney disease: Secondary | ICD-10-CM | POA: Diagnosis not present

## 2024-07-26 DIAGNOSIS — Z89022 Acquired absence of left finger(s): Secondary | ICD-10-CM | POA: Diagnosis not present

## 2024-07-26 DIAGNOSIS — L89892 Pressure ulcer of other site, stage 2: Secondary | ICD-10-CM | POA: Diagnosis not present

## 2024-07-26 DIAGNOSIS — Z7901 Long term (current) use of anticoagulants: Secondary | ICD-10-CM | POA: Diagnosis not present

## 2024-07-26 DIAGNOSIS — I4891 Unspecified atrial fibrillation: Secondary | ICD-10-CM | POA: Diagnosis not present

## 2024-07-26 DIAGNOSIS — M1990 Unspecified osteoarthritis, unspecified site: Secondary | ICD-10-CM | POA: Diagnosis not present

## 2024-07-27 DIAGNOSIS — S88112D Complete traumatic amputation at level between knee and ankle, left lower leg, subsequent encounter: Secondary | ICD-10-CM | POA: Diagnosis not present

## 2024-07-27 DIAGNOSIS — S88111D Complete traumatic amputation at level between knee and ankle, right lower leg, subsequent encounter: Secondary | ICD-10-CM | POA: Diagnosis not present

## 2024-07-27 DIAGNOSIS — L089 Local infection of the skin and subcutaneous tissue, unspecified: Secondary | ICD-10-CM | POA: Diagnosis not present

## 2024-07-27 DIAGNOSIS — L89893 Pressure ulcer of other site, stage 3: Secondary | ICD-10-CM | POA: Diagnosis not present

## 2024-07-27 DIAGNOSIS — T148XXA Other injury of unspecified body region, initial encounter: Secondary | ICD-10-CM | POA: Diagnosis not present

## 2024-07-31 DIAGNOSIS — M7652 Patellar tendinitis, left knee: Secondary | ICD-10-CM | POA: Diagnosis not present

## 2024-07-31 DIAGNOSIS — L089 Local infection of the skin and subcutaneous tissue, unspecified: Secondary | ICD-10-CM | POA: Diagnosis not present

## 2024-07-31 DIAGNOSIS — S81002A Unspecified open wound, left knee, initial encounter: Secondary | ICD-10-CM | POA: Diagnosis not present

## 2024-07-31 DIAGNOSIS — T148XXA Other injury of unspecified body region, initial encounter: Secondary | ICD-10-CM | POA: Diagnosis not present

## 2024-08-01 DIAGNOSIS — I129 Hypertensive chronic kidney disease with stage 1 through stage 4 chronic kidney disease, or unspecified chronic kidney disease: Secondary | ICD-10-CM | POA: Diagnosis not present

## 2024-08-01 DIAGNOSIS — N189 Chronic kidney disease, unspecified: Secondary | ICD-10-CM | POA: Diagnosis not present

## 2024-08-01 DIAGNOSIS — K5790 Diverticulosis of intestine, part unspecified, without perforation or abscess without bleeding: Secondary | ICD-10-CM | POA: Diagnosis not present

## 2024-08-01 DIAGNOSIS — L89892 Pressure ulcer of other site, stage 2: Secondary | ICD-10-CM | POA: Diagnosis not present

## 2024-08-01 DIAGNOSIS — I4891 Unspecified atrial fibrillation: Secondary | ICD-10-CM | POA: Diagnosis not present

## 2024-08-01 DIAGNOSIS — E1122 Type 2 diabetes mellitus with diabetic chronic kidney disease: Secondary | ICD-10-CM | POA: Diagnosis not present

## 2024-08-03 ENCOUNTER — Ambulatory Visit: Attending: Cardiology | Admitting: Cardiology

## 2024-08-03 ENCOUNTER — Encounter: Payer: Self-pay | Admitting: Cardiology

## 2024-08-03 VITALS — BP 104/60 | HR 83 | Ht 69.0 in | Wt 161.4 lb

## 2024-08-03 DIAGNOSIS — I10 Essential (primary) hypertension: Secondary | ICD-10-CM | POA: Diagnosis not present

## 2024-08-03 DIAGNOSIS — I2510 Atherosclerotic heart disease of native coronary artery without angina pectoris: Secondary | ICD-10-CM | POA: Insufficient documentation

## 2024-08-03 DIAGNOSIS — Z952 Presence of prosthetic heart valve: Secondary | ICD-10-CM | POA: Diagnosis not present

## 2024-08-03 DIAGNOSIS — I340 Nonrheumatic mitral (valve) insufficiency: Secondary | ICD-10-CM | POA: Insufficient documentation

## 2024-08-03 DIAGNOSIS — I4891 Unspecified atrial fibrillation: Secondary | ICD-10-CM | POA: Insufficient documentation

## 2024-08-03 DIAGNOSIS — L89893 Pressure ulcer of other site, stage 3: Secondary | ICD-10-CM | POA: Diagnosis not present

## 2024-08-03 NOTE — Patient Instructions (Signed)
 Medication Instructions:  Your physician recommends that you continue on your current medications as directed. Please refer to the Current Medication list given to you today.   Labwork: Requesting labs from Primary Care Physician   Testing/Procedures: Your physician has requested that you have an echocardiogram. Echocardiography is a painless test that uses sound waves to create images of your heart. It provides your doctor with information about the size and shape of your heart and how well your heart's chambers and valves are working. This procedure takes approximately one hour. There are no restrictions for this procedure. Please do NOT wear cologne, perfume, aftershave, or lotions (deodorant is allowed). Please arrive 15 minutes prior to your appointment time.  Please note: We ask at that you not bring children with you during ultrasound (echo/ vascular) testing. Due to room size and safety concerns, children are not allowed in the ultrasound rooms during exams. Our front office staff cannot provide observation of children in our lobby area while testing is being conducted. An adult accompanying a patient to their appointment will only be allowed in the ultrasound room at the discretion of the ultrasound technician under special circumstances. We apologize for any inconvenience.   Follow-Up: Your physician recommends that you schedule a follow-up appointment in: 6 months  Any Other Special Instructions Will Be Listed Below (If Applicable). Thank you for choosing [facility]!     If you need a refill on your cardiac medications before your next appointment, please call your pharmacy.

## 2024-08-03 NOTE — Progress Notes (Signed)
 Clinical Summary Mr. Suddreth is a 78 y.o.male seen today for follow up of the following medical problems.    1. Aortic stenosis - history of bicuspid AV - history of AVR 10/22/2016,  [place] Ease Pericardial Tissue Valve (size 25mm, model # 3300TFX, serial # [id number]) along with 3 vessel CABG, MV repair, and repair of ascending aortic aneurysm     - OR on 10/22/16 for CABG X 3, AVR, MVR, repair of thoracic ascending aneurysm with placement of VAC on open chest Post op course significant for cardiogenic shock requiring multiple pressors, SAM requiring removal of mitral annuloplasty ring,  persistent fevers, encephalopathy, shocked liver, DIC due to consumptive intraoperative coagulopathy, diffuse clotting with ischemia of distal BUE/BLE extremities, A fib, C diff colitis, volume overload and VDRF with difficulty with vent wean requiring tracheostomy.  Sternal wound closed on 12/4 and was started on coumadin  for RV thrombus and A fib.     [facility] course significant for MRSA bacteremia, pneumonitis, intermittent fevers, dysphagia requiring PEG tube placement on 1/15 by Dr. Luverne. He developed progressive gangrenous changes of BUE and BLE requiring B-transtibial amputation , left transmetacarpal amputation and Right MCP amputation of right hand by Dr. Harden on 1/19.    01/2017 echo: 55-60%, no WMAs, grade II diastolic dysfunction. Normal functioning AV.   07/2019 echo: LVEF 55-60%, mild RV dysfunction, mild to mod MR   Jan 2023 echo: LVE 55-60%, indet diastolic fxn, mod RV dysfucntion, mod PASP, severe BAE, mild to mod MR, mod MS. Normal AVR. Dilated fixed IVC, normal AVR  - some SOB today but not typical  - no recent edema - compliant with meds.       2. Chronic diastolic HF  Jan 2023 echo: LVE 55-60%, indet diastolic fxn, mod RV dysfucntion, mod PASP, severe BAE, mild to mod MR, mod MS. Normal AVR. Dilated fixed IVC - Dec 15 2021: Cr 1.23 BUN 25 BNP 2994     - some SOB  today but not typical  - no recent edema - compliant with meds.   takes torsemide  80mg  bid, metolazone  2.5mg  on Wed and Sat      3. Mitral regurgitation - patient had MV ring placed during his surgery 10/22/16 along with AVR, ascending thoracic aneurysm repair, and CABG. - ring was subsequently removed during that operation due to significant [facility] Jan 2023 echo: LVE 55-60%, indet diastolic fxn, mod RV dysfucntion, mod PASP, severe BAE, mild to mod MR, mod MS. Normal AVR. Dilated fixed IVC      4. CAD - history of CABG 10/22/2016 (LIMA-Diag and distal LAD, SVG-LAD) - no chest pains   5. Ischemic limbs - occurred in setting of cardiogenic shock requiring high dose pressors as well as DIC  - required amputation - followed by ortho. - has been on coumadin , ASA   6.Afib/aflutter - initally had post op afib - noted to be in aflutter at clinical f/u and thus was committed to long term anticoagulation  08/2019 monitor no significant arrhytmias   - no bleeding on coumadin      - 07/2019 ekg afib - 11/27/20 EKG SR - 1 2023 EKG afib   - rare palpitations, lasts just a few seconds - no bleeding on coumadin      7. Hyperlipidemia - 02/2021 TC 99 TG 227 HDL 30 LDL 33 - he is on atorvastatin  - recent labs with pcp 10/2022 LDL 39   - he reports more recent lipid panel with pcp, we  will request results   Past Medical History:  Diagnosis Date   Arthritis    hips, shoulders; knees; back (10/20/2016)   Bicuspid aortic valve    BPH (benign prostatic hypertrophy)    Carpal tunnel syndrome of right wrist    Coronary artery disease involving native coronary artery of native heart with unstable angina pectoris (HCC) 10/20/2016   Diverticulitis    GERD (gastroesophageal reflux disease)    Gout    Heart murmur    Hyperlipemia    Hypertension    Postoperative atrial fibrillation (HCC) 10/24/2016   RLS (restless legs syndrome)    S/P aortic valve replacement with bioprosthetic valve  10/22/2016   25 mm [facility] Ease bovine pericardial bioprosthetic tissue valve   S/P ascending aortic aneurysm repair 10/22/2016   28 mm supracoronary straight graft replacement of ascending thoracic aortic aneurysm   S/P CABG x 3 10/22/2016   Sequential LIMA to Diag and LAD, SVG to distal LAD, open vein harvest right thigh   S/P mitral valve repair 10/22/2016   Artificial Gore-tex neochord placement x6 - posterior annuloplasty band placed but removed due to systolic anterior motion of mitral valve   Severe aortic stenosis    Snores    Never been tested for sleep apnea   Thoracic ascending aortic aneurysm (HCC) 10/21/2016   Thrombocytopenia (HCC) 10/22/2016   Type II diabetes mellitus (HCC)      Allergies  Allergen Reactions   Sulfa Antibiotics Hives     Current Outpatient Medications  Medication Sig Dispense Refill   alendronate (FOSAMAX) 70 MG tablet Take 70 mg by mouth once a week. Take with a full glass of water  on an empty stomach.     allopurinol  (ZYLOPRIM ) 300 MG tablet Take 300 mg by mouth daily.     aspirin  EC 81 MG tablet Take 81 mg by mouth daily.     atorvastatin  (LIPITOR) 40 MG tablet TAKE 1 TABLET DAILY AT 6PM 90 tablet 3   Lactobacillus (PROBIOTIC ACIDOPHILUS PO) Take 1 capsule by mouth daily.     lisinopril  (ZESTRIL ) 2.5 MG tablet TAKE 1 TABLET DAILY 90 tablet 3   Magnesium  Oxide (MAG-OX 400 PO) Take 400 mg by mouth 2 (two) times daily.     Melatonin 10 MG CAPS Take 10 mg by mouth at bedtime.      metFORMIN  (GLUCOPHAGE ) 500 MG tablet Take 1 tablet (500 mg total) by mouth in the morning. Take 2 tablets (1000 mg) every evening. (Patient taking differently: Take 500 mg by mouth in the morning.)     metolazone  (ZAROXOLYN ) 2.5 MG tablet TAKE 1 TABLET (2.5 MG TOTAL) BY MOUTH EVERY WEDNESDAY. 12 tablet 1   metoprolol  succinate (TOPROL -XL) 100 MG 24 hr tablet TAKE 1 TABLET TWICE A DAY 180 tablet 2   MISC NATURAL PRODUCTS PO Take 1 tablet by mouth 2 (two) times daily.  Sucontral D - natural product for healthy blood sugar levels (Patient not taking: Reported on 04/18/2024)     nitroGLYCERIN  (NITROSTAT ) 0.4 MG SL tablet PLACE 1 TAB UNDER TONGUE EVERY 5 MIN,UP TO 3 DOSES AS NEEDED FOR CHEST PAIN(AFTER 3RD DOSE GO TO ED) 25 tablet 3   potassium chloride  SA (KLOR-CON  M20) 20 MEQ tablet TAKE 1 TABLET TWICE A DAY 180 tablet 1   rOPINIRole  (REQUIP ) 2 MG tablet Take 2 mg by mouth See admin instructions. Takes 2mg  at 4:30pm and 2mg  at 9:30pm.     tamsulosin (FLOMAX) 0.4 MG CAPS capsule Take 0.4 mg by  mouth daily.     torsemide  (DEMADEX ) 20 MG tablet TAKE 4 TABLETS (80 MG TOTAL) BY MOUTH 2 (TWO) TIMES DAILY. 720 tablet 1   warfarin (COUMADIN ) 2 MG tablet TAKE 1 TO 2 TABLETS BY MOUTH DAILY OR AS DIRECTED 180 tablet 1   No current facility-administered medications for this visit.     Past Surgical History:  Procedure Laterality Date   AMPUTATION Bilateral 12/11/2016   Procedure: AMPUTATION BELOW KNEE BILATERALLY;  Surgeon: Jerona Harden GAILS, MD;  Location: [facility] OR;  Service: Orthopedics;  Laterality: Bilateral;   AMPUTATION Bilateral 12/11/2016   Procedure: AMPUTATION BILATERAL HANDS EXCEPT RIGHT THUMB;  Surgeon: Jerona Harden GAILS, MD;  Location: [facility] OR;  Service: Orthopedics;  Laterality: Bilateral;   AORTIC VALVE REPLACEMENT N/A 10/22/2016   Procedure: AORTIC VALVE REPLACEMENT (AVR) WITH SIZE 25 MM MAGNA EASE PERICARDIAL BIOPROSTHESIS - AORTIC;  Surgeon: Sudie VEAR Laine, MD;  Location: [facility] OR;  Service: Open Heart Surgery;  Laterality: N/A;   BONE EXCISION Right 02/01/2017   Procedure: EXCISION RIGHT INDEX METACARPAL HEAD;  Surgeon: Jerona Harden GAILS, MD;  Location: [facility] OR;  Service: Orthopedics;  Laterality: Right;   CARDIAC CATHETERIZATION N/A 10/20/2016   Procedure: Right/Left Heart Cath and Coronary Angiography;  Surgeon: Alm LELON Clay, MD;  Location: [facility] INVASIVE CV LAB;  Service: Cardiovascular;  Laterality: N/A;   CARPAL TUNNEL RELEASE Right 11/28/2013   Procedure: RIGHT WRIST  CARPAL TUNNEL RELEASE;  Surgeon: Lamar DELENA Millman, MD;  Location: [facility];  Service: Orthopedics;  Laterality: Right;   CATARACT EXTRACTION W/ INTRAOCULAR LENS  IMPLANT, BILATERAL Bilateral 1978   COLONOSCOPY     CORONARY ARTERY BYPASS GRAFT N/A 10/22/2016   Procedure: CORONARY ARTERY BYPASS GRAFTING (CABG)x 2 WITH LIMA TO DIAGONAL, OPEN  HARVESTING OF RIGHT SAPHENOUS VEIN FOR VEIN GRAFT TO LAD;  Surgeon: Sudie VEAR Laine, MD;  Location: [facility];  Service: Open Heart Surgery;  Laterality: N/A;   FRACTURE SURGERY     INGUINAL HERNIA REPAIR Right 1998   IR GENERIC HISTORICAL  12/07/2016   IR US  GUIDE VASC ACCESS RIGHT 12/07/2016 Marcey Moan, MD [facility]   IR GENERIC HISTORICAL  12/07/2016   IR RADIOLOGY PERIPHERAL GUIDED IV START 12/07/2016 Marcey Moan, MD [facility]   IR GENERIC HISTORICAL  12/07/2016   IR GASTROSTOMY TUBE MOD SED 12/07/2016 Marcey Moan, MD [facility]   LIPOMA EXCISION Right 2008   side of my head   MITRAL VALVE REPAIR N/A 10/22/2016   Procedure: MITRAL VALVE REPAIR (MVR) WITH SIZE 30 SORIN ANNULOFLEX ANNULOPLASTY RING WITH SUBSEQUENT REMOVAL OF RING;  Surgeon: Sudie VEAR Laine, MD;  Location: [facility];  Service: Open Heart Surgery;  Laterality: N/A;   PENECTOMY  2007   Peyronie's disease    PENILE PROSTHESIS IMPLANT  2009   REMOVAL OF PENILE PROSTHESIS N/A 02/01/2017   Procedure: REMOVAL OF PENILE PROSTHESIS;  Surgeon: Mark Ottelin, MD;  Location: [facility] OR;  Service: Urology;  Laterality: N/A;   SHOULDER OPEN ROTATOR CUFF REPAIR Right 2006   STERNAL CLOSURE N/A 10/26/2016   Procedure: STERNAL WASHOUT AND DELAYED PRIMARY CLOSURE;  Surgeon: Sudie VEAR Laine, MD;  Location: [facility];  Service: Thoracic;  Laterality: N/A;   TEE WITHOUT CARDIOVERSION N/A 10/26/2016   Procedure: TRANSESOPHAGEAL ECHOCARDIOGRAM (TEE);  Surgeon: Sudie VEAR Laine, MD;  [facility] OR;  Service: Thoracic;  Laterality: N/A;   TEE WITHOUT CARDIOVERSION N/A 10/22/2016   Procedure:  TRANSESOPHAGEAL ECHOCARDIOGRAM (TEE);  Surgeon: Sudie VEAR Laine, MD;  Location: [facility];  Service: Open Heart Surgery;  Laterality: N/A;   THORACIC AORTIC ANEURYSM REPAIR  10/22/2016   Procedure: ASCENDING AORTIC  ANEURYSM REPAIR (AAA) WITH 28 MM HEMASHIELD PLATINUM WOVEN DOUBLE VELOUR VASCULAR GRAFT;  Surgeon: Sudie VEAR Laine, MD;  Location: [facility];  Service: Open Heart Surgery;;   TONSILLECTOMY  ~ 1955   TRACHEOSTOMY TUBE PLACEMENT N/A 11/09/2016   Procedure: TRACHEOSTOMY;  Surgeon: Sudie VEAR Laine, MD;  Location: [facility];  Service: Thoracic;  Laterality: N/A;   TRANSURETHRAL RESECTION OF PROSTATE  2005   TRIGGER FINGER RELEASE Bilateral    several lt and rt hands   TRIGGER FINGER RELEASE Right 11/28/2013   Procedure: RIGHT TRIGGER FINGER  RELEASE (TENDON SHEATH INCISION);  Surgeon: Lamar DELENA Millman, MD;  Location: [facility];  Service: Orthopedics;  Laterality: Right;   VIDEO BRONCHOSCOPY N/A 11/09/2016   Procedure: VIDEO BRONCHOSCOPY;  Surgeon: Sudie VEAR Laine, MD;  Location: [facility] OR;  Service: Thoracic;  Laterality: N/A;   WRIST FRACTURE SURGERY Right ~ 1959     Allergies  Allergen Reactions   Sulfa Antibiotics Hives      Family History  Problem Relation Age of Onset   Lung cancer Mother    Clotting disorder Father        No details   Heart disease Sister 17       Stents   Cancer Sister        Throat     Social History Mr. Six reports that he quit smoking about 41 years ago. His smoking use included pipe and cigars. He has never used smokeless tobacco. Mr. Gripp reports current alcohol  use of about 10.0 standard drinks of alcohol  per week.    Physical Examination Today's Vitals   08/03/24 1514  BP: 104/60  Pulse: 83  SpO2: 99%  Weight: 161 lb 6.4 oz (73.2 kg)  Height: 5' 9 (1.753 m)   Body mass index is 23.83 kg/m.  Gen: resting comfortably, no acute distress HEENT: no scleral icterus, pupils equal round and reactive, no palptable cervical  adenopathy,  CV: irreg, 2/6 systolic murmur apex Resp: Clear to auscultation bilaterally GI: abdomen is soft, non-tender, non-distended, normal bowel sounds, no hepatosplenomegaly MSK: extremities are warm, no edema.  Skin: warm, no rash Neuro:  no focal deficits Psych: appropriate affect   Diagnostic Studies  07/2019 echo 1. The left ventricle has normal systolic function, with an ejection fraction of 55-60%. The cavity size was normal. There is moderately increased left ventricular wall thickness. Left ventricular diastolic Doppler parameters are indeterminate.  2. The right ventricle has mildly reduced systolic function. The cavity was moderately enlarged. There is not assessed.  3. The ventricular septum flattens in systole consistent with RV pressure overload.  4. Left atrial size was severely dilated.  5. Right atrial size was mildly dilated.  6. Mitral valve regurgitation mild to moderate MR, the jet is eccentric and may be underestimated. No evidence of mitral valve stenosis.  7. The aortic valve was not well visualized. No stenosis of the aortic valve.  8. [place] Ease Pericardial Tissue Valve (size 25mm, model # 3300TFX is in the AV position.  9. The aorta is normal unless otherwise noted. 10. The aortic root is normal in size and structure. 11. Pulmonary hypertension is indeterminate, inadequate TR jet. 12. The inferior vena cava was dilated in size with >50% respiratory variability.     Jan 2023 echo 1. Left ventricular ejection fraction, by estimation, is  55 to 60%. The  left ventricle has normal function. Left ventricular endocardial border  not optimally defined to evaluate regional wall motion. Left ventricular  diastolic function could not be  evaluated.   2. Right ventricular systolic function is moderately reduced. The right  ventricular size is moderately enlarged. There is moderately elevated  pulmonary artery systolic pressure. The estimated right  ventricular  systolic pressure is 51.7 mmHg.   3. Left atrial size was severely dilated.   4. Right atrial size was severely dilated.   5. The mitral valve is degenerative. Mild to moderate mitral valve  regurgitation. Mild mitral stenosis. The mean mitral valve gradient is 7.0  mmHg. Moderate mitral annular calcification.   6. The aortic valve has been repaired/replaced. There is a 25 mm [place] Pericardial Tissue valve present in the aortic position.      Aortic valve regurgitation is not visualized. No aortic stenosis is  present. Aortic valve area, by VTI measures 2.42 cm. Aortic valve mean  gradient measures 5.0 mmHg. Aortic valve Vmax measures 1.64 m/s.   7. Aortic root/ascending aorta has been repaired/replaced with normal  dimensions.   8. The inferior vena cava is dilated in size with <50% respiratory  variability, suggesting right atrial pressure of 15 mmHg.   9. There is a trivial pericardial effusion that is circumferential.        Assessment and Plan   1.Chronic diastolic - he is euvolemic today, continue current meds   2. CAD - denies symptoms, continue current meds   3. Long standing persistent Afib/acquired thrombophilia - afib with tissue valve however have favored coumadin  , he does have a moderate gradient across his mitral valve and would qualify as having valvular afib - no symptoms, continue current meds   4. Mitral regurgitation - we will repeat echo  5.HLD - request labs from pcp    Dorn PHEBE Ross, M.D.

## 2024-08-07 ENCOUNTER — Encounter: Payer: Self-pay | Admitting: *Deleted

## 2024-08-08 ENCOUNTER — Encounter: Payer: Self-pay | Admitting: Cardiology

## 2024-08-08 DIAGNOSIS — I4891 Unspecified atrial fibrillation: Secondary | ICD-10-CM | POA: Diagnosis not present

## 2024-08-08 DIAGNOSIS — N189 Chronic kidney disease, unspecified: Secondary | ICD-10-CM | POA: Diagnosis not present

## 2024-08-08 DIAGNOSIS — L89892 Pressure ulcer of other site, stage 2: Secondary | ICD-10-CM | POA: Diagnosis not present

## 2024-08-08 DIAGNOSIS — E1122 Type 2 diabetes mellitus with diabetic chronic kidney disease: Secondary | ICD-10-CM | POA: Diagnosis not present

## 2024-08-08 DIAGNOSIS — I129 Hypertensive chronic kidney disease with stage 1 through stage 4 chronic kidney disease, or unspecified chronic kidney disease: Secondary | ICD-10-CM | POA: Diagnosis not present

## 2024-08-08 DIAGNOSIS — K5790 Diverticulosis of intestine, part unspecified, without perforation or abscess without bleeding: Secondary | ICD-10-CM | POA: Diagnosis not present

## 2024-08-10 DIAGNOSIS — S88111D Complete traumatic amputation at level between knee and ankle, right lower leg, subsequent encounter: Secondary | ICD-10-CM | POA: Diagnosis not present

## 2024-08-10 DIAGNOSIS — S88112D Complete traumatic amputation at level between knee and ankle, left lower leg, subsequent encounter: Secondary | ICD-10-CM | POA: Diagnosis not present

## 2024-08-10 DIAGNOSIS — L89893 Pressure ulcer of other site, stage 3: Secondary | ICD-10-CM | POA: Diagnosis not present

## 2024-08-15 DIAGNOSIS — N189 Chronic kidney disease, unspecified: Secondary | ICD-10-CM | POA: Diagnosis not present

## 2024-08-15 DIAGNOSIS — K5790 Diverticulosis of intestine, part unspecified, without perforation or abscess without bleeding: Secondary | ICD-10-CM | POA: Diagnosis not present

## 2024-08-15 DIAGNOSIS — I4891 Unspecified atrial fibrillation: Secondary | ICD-10-CM | POA: Diagnosis not present

## 2024-08-15 DIAGNOSIS — L89892 Pressure ulcer of other site, stage 2: Secondary | ICD-10-CM | POA: Diagnosis not present

## 2024-08-15 DIAGNOSIS — I129 Hypertensive chronic kidney disease with stage 1 through stage 4 chronic kidney disease, or unspecified chronic kidney disease: Secondary | ICD-10-CM | POA: Diagnosis not present

## 2024-08-15 DIAGNOSIS — E1122 Type 2 diabetes mellitus with diabetic chronic kidney disease: Secondary | ICD-10-CM | POA: Diagnosis not present

## 2024-08-17 DIAGNOSIS — L89893 Pressure ulcer of other site, stage 3: Secondary | ICD-10-CM | POA: Diagnosis not present

## 2024-08-21 ENCOUNTER — Other Ambulatory Visit: Payer: Self-pay | Admitting: Cardiology

## 2024-08-22 ENCOUNTER — Ambulatory Visit (INDEPENDENT_AMBULATORY_CARE_PROVIDER_SITE_OTHER): Admitting: *Deleted

## 2024-08-22 ENCOUNTER — Ambulatory Visit: Attending: Cardiology

## 2024-08-22 DIAGNOSIS — Q2381 Bicuspid aortic valve: Secondary | ICD-10-CM

## 2024-08-22 DIAGNOSIS — K5790 Diverticulosis of intestine, part unspecified, without perforation or abscess without bleeding: Secondary | ICD-10-CM | POA: Diagnosis not present

## 2024-08-22 DIAGNOSIS — I4891 Unspecified atrial fibrillation: Secondary | ICD-10-CM

## 2024-08-22 DIAGNOSIS — N189 Chronic kidney disease, unspecified: Secondary | ICD-10-CM | POA: Diagnosis not present

## 2024-08-22 DIAGNOSIS — I340 Nonrheumatic mitral (valve) insufficiency: Secondary | ICD-10-CM | POA: Insufficient documentation

## 2024-08-22 DIAGNOSIS — I82603 Acute embolism and thrombosis of unspecified veins of upper extremity, bilateral: Secondary | ICD-10-CM

## 2024-08-22 DIAGNOSIS — E782 Mixed hyperlipidemia: Secondary | ICD-10-CM | POA: Diagnosis not present

## 2024-08-22 DIAGNOSIS — Z9889 Other specified postprocedural states: Secondary | ICD-10-CM | POA: Insufficient documentation

## 2024-08-22 DIAGNOSIS — N400 Enlarged prostate without lower urinary tract symptoms: Secondary | ICD-10-CM | POA: Diagnosis not present

## 2024-08-22 DIAGNOSIS — I129 Hypertensive chronic kidney disease with stage 1 through stage 4 chronic kidney disease, or unspecified chronic kidney disease: Secondary | ICD-10-CM | POA: Diagnosis not present

## 2024-08-22 DIAGNOSIS — E1122 Type 2 diabetes mellitus with diabetic chronic kidney disease: Secondary | ICD-10-CM | POA: Diagnosis not present

## 2024-08-22 DIAGNOSIS — L89892 Pressure ulcer of other site, stage 2: Secondary | ICD-10-CM | POA: Diagnosis not present

## 2024-08-22 DIAGNOSIS — I9789 Other postprocedural complications and disorders of the circulatory system, not elsewhere classified: Secondary | ICD-10-CM

## 2024-08-22 DIAGNOSIS — E1140 Type 2 diabetes mellitus with diabetic neuropathy, unspecified: Secondary | ICD-10-CM | POA: Diagnosis not present

## 2024-08-22 DIAGNOSIS — I10 Essential (primary) hypertension: Secondary | ICD-10-CM | POA: Diagnosis not present

## 2024-08-22 LAB — POCT INR: INR: 3.4 — AB (ref 2.0–3.0)

## 2024-08-22 MED ORDER — PERFLUTREN LIPID MICROSPHERE
1.0000 mL | INTRAVENOUS | Status: AC | PRN
Start: 2024-08-22 — End: 2024-08-22
  Administered 2024-08-22: 2 mL via INTRAVENOUS

## 2024-08-22 NOTE — Progress Notes (Signed)
 INR 3.4  Please see anticoagulation encounter

## 2024-08-22 NOTE — Patient Instructions (Signed)
 Hold warfarin tonight then resume 2 tablets daily except 1 tablet on Sundays, Tuesdays and Thursdays Recheck in 6 wks

## 2024-08-23 ENCOUNTER — Encounter

## 2024-08-23 LAB — ECHOCARDIOGRAM COMPLETE
AV Mean grad: 8 mmHg
AV Peak grad: 15.5 mmHg
Ao pk vel: 1.97 m/s
Area-P 1/2: 3.49 cm2
Calc EF: 57.2 %
MV M vel: 4.29 m/s
MV Peak grad: 73.6 mmHg
S' Lateral: 3.5 cm
Single Plane A2C EF: 60.2 %
Single Plane A4C EF: 56.1 %

## 2024-08-24 DIAGNOSIS — L89893 Pressure ulcer of other site, stage 3: Secondary | ICD-10-CM | POA: Diagnosis not present

## 2024-08-25 DIAGNOSIS — E1122 Type 2 diabetes mellitus with diabetic chronic kidney disease: Secondary | ICD-10-CM | POA: Diagnosis not present

## 2024-08-25 DIAGNOSIS — I129 Hypertensive chronic kidney disease with stage 1 through stage 4 chronic kidney disease, or unspecified chronic kidney disease: Secondary | ICD-10-CM | POA: Diagnosis not present

## 2024-08-25 DIAGNOSIS — Z89021 Acquired absence of right finger(s): Secondary | ICD-10-CM | POA: Diagnosis not present

## 2024-08-25 DIAGNOSIS — I4891 Unspecified atrial fibrillation: Secondary | ICD-10-CM | POA: Diagnosis not present

## 2024-08-25 DIAGNOSIS — Z7901 Long term (current) use of anticoagulants: Secondary | ICD-10-CM | POA: Diagnosis not present

## 2024-08-25 DIAGNOSIS — B009 Herpesviral infection, unspecified: Secondary | ICD-10-CM | POA: Diagnosis not present

## 2024-08-25 DIAGNOSIS — Z89511 Acquired absence of right leg below knee: Secondary | ICD-10-CM | POA: Diagnosis not present

## 2024-08-25 DIAGNOSIS — N189 Chronic kidney disease, unspecified: Secondary | ICD-10-CM | POA: Diagnosis not present

## 2024-08-25 DIAGNOSIS — M1990 Unspecified osteoarthritis, unspecified site: Secondary | ICD-10-CM | POA: Diagnosis not present

## 2024-08-25 DIAGNOSIS — Z7984 Long term (current) use of oral hypoglycemic drugs: Secondary | ICD-10-CM | POA: Diagnosis not present

## 2024-08-25 DIAGNOSIS — K5790 Diverticulosis of intestine, part unspecified, without perforation or abscess without bleeding: Secondary | ICD-10-CM | POA: Diagnosis not present

## 2024-08-25 DIAGNOSIS — E7800 Pure hypercholesterolemia, unspecified: Secondary | ICD-10-CM | POA: Diagnosis not present

## 2024-08-25 DIAGNOSIS — G2581 Restless legs syndrome: Secondary | ICD-10-CM | POA: Diagnosis not present

## 2024-08-25 DIAGNOSIS — N400 Enlarged prostate without lower urinary tract symptoms: Secondary | ICD-10-CM | POA: Diagnosis not present

## 2024-08-25 DIAGNOSIS — Z89022 Acquired absence of left finger(s): Secondary | ICD-10-CM | POA: Diagnosis not present

## 2024-08-25 DIAGNOSIS — L89892 Pressure ulcer of other site, stage 2: Secondary | ICD-10-CM | POA: Diagnosis not present

## 2024-08-25 DIAGNOSIS — Z89512 Acquired absence of left leg below knee: Secondary | ICD-10-CM | POA: Diagnosis not present

## 2024-08-25 DIAGNOSIS — M810 Age-related osteoporosis without current pathological fracture: Secondary | ICD-10-CM | POA: Diagnosis not present

## 2024-08-25 DIAGNOSIS — F1090 Alcohol use, unspecified, uncomplicated: Secondary | ICD-10-CM | POA: Diagnosis not present

## 2024-08-25 DIAGNOSIS — M109 Gout, unspecified: Secondary | ICD-10-CM | POA: Diagnosis not present

## 2024-08-29 DIAGNOSIS — L89892 Pressure ulcer of other site, stage 2: Secondary | ICD-10-CM | POA: Diagnosis not present

## 2024-08-29 DIAGNOSIS — I129 Hypertensive chronic kidney disease with stage 1 through stage 4 chronic kidney disease, or unspecified chronic kidney disease: Secondary | ICD-10-CM | POA: Diagnosis not present

## 2024-08-29 DIAGNOSIS — K5790 Diverticulosis of intestine, part unspecified, without perforation or abscess without bleeding: Secondary | ICD-10-CM | POA: Diagnosis not present

## 2024-08-29 DIAGNOSIS — N189 Chronic kidney disease, unspecified: Secondary | ICD-10-CM | POA: Diagnosis not present

## 2024-08-29 DIAGNOSIS — I4891 Unspecified atrial fibrillation: Secondary | ICD-10-CM | POA: Diagnosis not present

## 2024-08-29 DIAGNOSIS — E1122 Type 2 diabetes mellitus with diabetic chronic kidney disease: Secondary | ICD-10-CM | POA: Diagnosis not present

## 2024-08-31 DIAGNOSIS — L89893 Pressure ulcer of other site, stage 3: Secondary | ICD-10-CM | POA: Diagnosis not present

## 2024-09-05 DIAGNOSIS — I4891 Unspecified atrial fibrillation: Secondary | ICD-10-CM | POA: Diagnosis not present

## 2024-09-05 DIAGNOSIS — N189 Chronic kidney disease, unspecified: Secondary | ICD-10-CM | POA: Diagnosis not present

## 2024-09-05 DIAGNOSIS — E1122 Type 2 diabetes mellitus with diabetic chronic kidney disease: Secondary | ICD-10-CM | POA: Diagnosis not present

## 2024-09-05 DIAGNOSIS — K5790 Diverticulosis of intestine, part unspecified, without perforation or abscess without bleeding: Secondary | ICD-10-CM | POA: Diagnosis not present

## 2024-09-05 DIAGNOSIS — I129 Hypertensive chronic kidney disease with stage 1 through stage 4 chronic kidney disease, or unspecified chronic kidney disease: Secondary | ICD-10-CM | POA: Diagnosis not present

## 2024-09-05 DIAGNOSIS — L89892 Pressure ulcer of other site, stage 2: Secondary | ICD-10-CM | POA: Diagnosis not present

## 2024-09-07 DIAGNOSIS — S88111D Complete traumatic amputation at level between knee and ankle, right lower leg, subsequent encounter: Secondary | ICD-10-CM | POA: Diagnosis not present

## 2024-09-07 DIAGNOSIS — L89893 Pressure ulcer of other site, stage 3: Secondary | ICD-10-CM | POA: Diagnosis not present

## 2024-09-07 DIAGNOSIS — S88112D Complete traumatic amputation at level between knee and ankle, left lower leg, subsequent encounter: Secondary | ICD-10-CM | POA: Diagnosis not present

## 2024-09-09 DIAGNOSIS — Z23 Encounter for immunization: Secondary | ICD-10-CM | POA: Diagnosis not present

## 2024-09-11 DIAGNOSIS — K5790 Diverticulosis of intestine, part unspecified, without perforation or abscess without bleeding: Secondary | ICD-10-CM | POA: Diagnosis not present

## 2024-09-11 DIAGNOSIS — I129 Hypertensive chronic kidney disease with stage 1 through stage 4 chronic kidney disease, or unspecified chronic kidney disease: Secondary | ICD-10-CM | POA: Diagnosis not present

## 2024-09-11 DIAGNOSIS — I4891 Unspecified atrial fibrillation: Secondary | ICD-10-CM | POA: Diagnosis not present

## 2024-09-11 DIAGNOSIS — E1122 Type 2 diabetes mellitus with diabetic chronic kidney disease: Secondary | ICD-10-CM | POA: Diagnosis not present

## 2024-09-11 DIAGNOSIS — N189 Chronic kidney disease, unspecified: Secondary | ICD-10-CM | POA: Diagnosis not present

## 2024-09-11 DIAGNOSIS — L89892 Pressure ulcer of other site, stage 2: Secondary | ICD-10-CM | POA: Diagnosis not present

## 2024-09-14 DIAGNOSIS — L89893 Pressure ulcer of other site, stage 3: Secondary | ICD-10-CM | POA: Diagnosis not present

## 2024-09-18 DIAGNOSIS — I4891 Unspecified atrial fibrillation: Secondary | ICD-10-CM | POA: Diagnosis not present

## 2024-09-18 DIAGNOSIS — K5790 Diverticulosis of intestine, part unspecified, without perforation or abscess without bleeding: Secondary | ICD-10-CM | POA: Diagnosis not present

## 2024-09-18 DIAGNOSIS — N189 Chronic kidney disease, unspecified: Secondary | ICD-10-CM | POA: Diagnosis not present

## 2024-09-18 DIAGNOSIS — I129 Hypertensive chronic kidney disease with stage 1 through stage 4 chronic kidney disease, or unspecified chronic kidney disease: Secondary | ICD-10-CM | POA: Diagnosis not present

## 2024-09-18 DIAGNOSIS — L89892 Pressure ulcer of other site, stage 2: Secondary | ICD-10-CM | POA: Diagnosis not present

## 2024-09-18 DIAGNOSIS — E1122 Type 2 diabetes mellitus with diabetic chronic kidney disease: Secondary | ICD-10-CM | POA: Diagnosis not present

## 2024-09-21 DIAGNOSIS — L89893 Pressure ulcer of other site, stage 3: Secondary | ICD-10-CM | POA: Diagnosis not present

## 2024-09-22 DIAGNOSIS — E782 Mixed hyperlipidemia: Secondary | ICD-10-CM | POA: Diagnosis not present

## 2024-09-22 DIAGNOSIS — E1140 Type 2 diabetes mellitus with diabetic neuropathy, unspecified: Secondary | ICD-10-CM | POA: Diagnosis not present

## 2024-09-22 DIAGNOSIS — N400 Enlarged prostate without lower urinary tract symptoms: Secondary | ICD-10-CM | POA: Diagnosis not present

## 2024-09-22 DIAGNOSIS — I10 Essential (primary) hypertension: Secondary | ICD-10-CM | POA: Diagnosis not present

## 2024-09-24 DIAGNOSIS — F1090 Alcohol use, unspecified, uncomplicated: Secondary | ICD-10-CM | POA: Diagnosis not present

## 2024-09-24 DIAGNOSIS — I4891 Unspecified atrial fibrillation: Secondary | ICD-10-CM | POA: Diagnosis not present

## 2024-09-24 DIAGNOSIS — K5790 Diverticulosis of intestine, part unspecified, without perforation or abscess without bleeding: Secondary | ICD-10-CM | POA: Diagnosis not present

## 2024-09-24 DIAGNOSIS — Z7984 Long term (current) use of oral hypoglycemic drugs: Secondary | ICD-10-CM | POA: Diagnosis not present

## 2024-09-24 DIAGNOSIS — M1990 Unspecified osteoarthritis, unspecified site: Secondary | ICD-10-CM | POA: Diagnosis not present

## 2024-09-24 DIAGNOSIS — E1122 Type 2 diabetes mellitus with diabetic chronic kidney disease: Secondary | ICD-10-CM | POA: Diagnosis not present

## 2024-09-24 DIAGNOSIS — M810 Age-related osteoporosis without current pathological fracture: Secondary | ICD-10-CM | POA: Diagnosis not present

## 2024-09-24 DIAGNOSIS — Z89021 Acquired absence of right finger(s): Secondary | ICD-10-CM | POA: Diagnosis not present

## 2024-09-24 DIAGNOSIS — B009 Herpesviral infection, unspecified: Secondary | ICD-10-CM | POA: Diagnosis not present

## 2024-09-24 DIAGNOSIS — E7800 Pure hypercholesterolemia, unspecified: Secondary | ICD-10-CM | POA: Diagnosis not present

## 2024-09-24 DIAGNOSIS — N400 Enlarged prostate without lower urinary tract symptoms: Secondary | ICD-10-CM | POA: Diagnosis not present

## 2024-09-24 DIAGNOSIS — N189 Chronic kidney disease, unspecified: Secondary | ICD-10-CM | POA: Diagnosis not present

## 2024-09-24 DIAGNOSIS — G2581 Restless legs syndrome: Secondary | ICD-10-CM | POA: Diagnosis not present

## 2024-09-24 DIAGNOSIS — L89892 Pressure ulcer of other site, stage 2: Secondary | ICD-10-CM | POA: Diagnosis not present

## 2024-09-24 DIAGNOSIS — Z89022 Acquired absence of left finger(s): Secondary | ICD-10-CM | POA: Diagnosis not present

## 2024-09-24 DIAGNOSIS — I129 Hypertensive chronic kidney disease with stage 1 through stage 4 chronic kidney disease, or unspecified chronic kidney disease: Secondary | ICD-10-CM | POA: Diagnosis not present

## 2024-09-24 DIAGNOSIS — Z89511 Acquired absence of right leg below knee: Secondary | ICD-10-CM | POA: Diagnosis not present

## 2024-09-24 DIAGNOSIS — Z7901 Long term (current) use of anticoagulants: Secondary | ICD-10-CM | POA: Diagnosis not present

## 2024-09-24 DIAGNOSIS — M109 Gout, unspecified: Secondary | ICD-10-CM | POA: Diagnosis not present

## 2024-09-24 DIAGNOSIS — Z89512 Acquired absence of left leg below knee: Secondary | ICD-10-CM | POA: Diagnosis not present

## 2024-09-25 DIAGNOSIS — I129 Hypertensive chronic kidney disease with stage 1 through stage 4 chronic kidney disease, or unspecified chronic kidney disease: Secondary | ICD-10-CM | POA: Diagnosis not present

## 2024-09-25 DIAGNOSIS — K5790 Diverticulosis of intestine, part unspecified, without perforation or abscess without bleeding: Secondary | ICD-10-CM | POA: Diagnosis not present

## 2024-09-25 DIAGNOSIS — L89892 Pressure ulcer of other site, stage 2: Secondary | ICD-10-CM | POA: Diagnosis not present

## 2024-09-25 DIAGNOSIS — N189 Chronic kidney disease, unspecified: Secondary | ICD-10-CM | POA: Diagnosis not present

## 2024-09-25 DIAGNOSIS — E1122 Type 2 diabetes mellitus with diabetic chronic kidney disease: Secondary | ICD-10-CM | POA: Diagnosis not present

## 2024-09-25 DIAGNOSIS — I4891 Unspecified atrial fibrillation: Secondary | ICD-10-CM | POA: Diagnosis not present

## 2024-09-28 DIAGNOSIS — L89893 Pressure ulcer of other site, stage 3: Secondary | ICD-10-CM | POA: Diagnosis not present

## 2024-10-02 DIAGNOSIS — E1122 Type 2 diabetes mellitus with diabetic chronic kidney disease: Secondary | ICD-10-CM | POA: Diagnosis not present

## 2024-10-02 DIAGNOSIS — I129 Hypertensive chronic kidney disease with stage 1 through stage 4 chronic kidney disease, or unspecified chronic kidney disease: Secondary | ICD-10-CM | POA: Diagnosis not present

## 2024-10-02 DIAGNOSIS — L89892 Pressure ulcer of other site, stage 2: Secondary | ICD-10-CM | POA: Diagnosis not present

## 2024-10-02 DIAGNOSIS — I4891 Unspecified atrial fibrillation: Secondary | ICD-10-CM | POA: Diagnosis not present

## 2024-10-02 DIAGNOSIS — K5790 Diverticulosis of intestine, part unspecified, without perforation or abscess without bleeding: Secondary | ICD-10-CM | POA: Diagnosis not present

## 2024-10-02 DIAGNOSIS — N189 Chronic kidney disease, unspecified: Secondary | ICD-10-CM | POA: Diagnosis not present

## 2024-10-03 ENCOUNTER — Ambulatory Visit: Attending: Cardiology | Admitting: *Deleted

## 2024-10-03 DIAGNOSIS — I9789 Other postprocedural complications and disorders of the circulatory system, not elsewhere classified: Secondary | ICD-10-CM | POA: Insufficient documentation

## 2024-10-03 DIAGNOSIS — Q2381 Bicuspid aortic valve: Secondary | ICD-10-CM | POA: Diagnosis not present

## 2024-10-03 DIAGNOSIS — I4891 Unspecified atrial fibrillation: Secondary | ICD-10-CM | POA: Insufficient documentation

## 2024-10-03 DIAGNOSIS — Z9889 Other specified postprocedural states: Secondary | ICD-10-CM | POA: Diagnosis not present

## 2024-10-03 DIAGNOSIS — I82603 Acute embolism and thrombosis of unspecified veins of upper extremity, bilateral: Secondary | ICD-10-CM | POA: Diagnosis not present

## 2024-10-03 LAB — POCT INR: INR: 2.3 (ref 2.0–3.0)

## 2024-10-03 NOTE — Patient Instructions (Signed)
 Take warfarin 1/2 tablet tonight then resume 1 tablet daily except 1/2 tablet only on Sundays Recheck 6 wks

## 2024-10-09 DIAGNOSIS — E1122 Type 2 diabetes mellitus with diabetic chronic kidney disease: Secondary | ICD-10-CM | POA: Diagnosis not present

## 2024-10-09 DIAGNOSIS — I4891 Unspecified atrial fibrillation: Secondary | ICD-10-CM | POA: Diagnosis not present

## 2024-10-09 DIAGNOSIS — L89892 Pressure ulcer of other site, stage 2: Secondary | ICD-10-CM | POA: Diagnosis not present

## 2024-10-09 DIAGNOSIS — N189 Chronic kidney disease, unspecified: Secondary | ICD-10-CM | POA: Diagnosis not present

## 2024-10-09 DIAGNOSIS — K5790 Diverticulosis of intestine, part unspecified, without perforation or abscess without bleeding: Secondary | ICD-10-CM | POA: Diagnosis not present

## 2024-10-09 DIAGNOSIS — I129 Hypertensive chronic kidney disease with stage 1 through stage 4 chronic kidney disease, or unspecified chronic kidney disease: Secondary | ICD-10-CM | POA: Diagnosis not present

## 2024-10-09 NOTE — Progress Notes (Signed)
 INR-2.3; Please see anticoagulation encounter

## 2024-10-12 DIAGNOSIS — L89893 Pressure ulcer of other site, stage 3: Secondary | ICD-10-CM | POA: Diagnosis not present

## 2024-10-16 DIAGNOSIS — I4891 Unspecified atrial fibrillation: Secondary | ICD-10-CM | POA: Diagnosis not present

## 2024-10-16 DIAGNOSIS — L89892 Pressure ulcer of other site, stage 2: Secondary | ICD-10-CM | POA: Diagnosis not present

## 2024-10-16 DIAGNOSIS — N189 Chronic kidney disease, unspecified: Secondary | ICD-10-CM | POA: Diagnosis not present

## 2024-10-16 DIAGNOSIS — K5790 Diverticulosis of intestine, part unspecified, without perforation or abscess without bleeding: Secondary | ICD-10-CM | POA: Diagnosis not present

## 2024-10-16 DIAGNOSIS — E1122 Type 2 diabetes mellitus with diabetic chronic kidney disease: Secondary | ICD-10-CM | POA: Diagnosis not present

## 2024-10-16 DIAGNOSIS — I129 Hypertensive chronic kidney disease with stage 1 through stage 4 chronic kidney disease, or unspecified chronic kidney disease: Secondary | ICD-10-CM | POA: Diagnosis not present

## 2024-10-20 DIAGNOSIS — K5790 Diverticulosis of intestine, part unspecified, without perforation or abscess without bleeding: Secondary | ICD-10-CM | POA: Diagnosis not present

## 2024-10-20 DIAGNOSIS — I4891 Unspecified atrial fibrillation: Secondary | ICD-10-CM | POA: Diagnosis not present

## 2024-10-20 DIAGNOSIS — E1122 Type 2 diabetes mellitus with diabetic chronic kidney disease: Secondary | ICD-10-CM | POA: Diagnosis not present

## 2024-10-20 DIAGNOSIS — N400 Enlarged prostate without lower urinary tract symptoms: Secondary | ICD-10-CM | POA: Diagnosis not present

## 2024-10-20 DIAGNOSIS — E782 Mixed hyperlipidemia: Secondary | ICD-10-CM | POA: Diagnosis not present

## 2024-10-20 DIAGNOSIS — N189 Chronic kidney disease, unspecified: Secondary | ICD-10-CM | POA: Diagnosis not present

## 2024-10-20 DIAGNOSIS — E1140 Type 2 diabetes mellitus with diabetic neuropathy, unspecified: Secondary | ICD-10-CM | POA: Diagnosis not present

## 2024-10-20 DIAGNOSIS — L89892 Pressure ulcer of other site, stage 2: Secondary | ICD-10-CM | POA: Diagnosis not present

## 2024-10-20 DIAGNOSIS — I129 Hypertensive chronic kidney disease with stage 1 through stage 4 chronic kidney disease, or unspecified chronic kidney disease: Secondary | ICD-10-CM | POA: Diagnosis not present

## 2024-10-20 DIAGNOSIS — I10 Essential (primary) hypertension: Secondary | ICD-10-CM | POA: Diagnosis not present

## 2024-10-23 DIAGNOSIS — K5790 Diverticulosis of intestine, part unspecified, without perforation or abscess without bleeding: Secondary | ICD-10-CM | POA: Diagnosis not present

## 2024-10-23 DIAGNOSIS — N189 Chronic kidney disease, unspecified: Secondary | ICD-10-CM | POA: Diagnosis not present

## 2024-10-23 DIAGNOSIS — I129 Hypertensive chronic kidney disease with stage 1 through stage 4 chronic kidney disease, or unspecified chronic kidney disease: Secondary | ICD-10-CM | POA: Diagnosis not present

## 2024-10-23 DIAGNOSIS — E1122 Type 2 diabetes mellitus with diabetic chronic kidney disease: Secondary | ICD-10-CM | POA: Diagnosis not present

## 2024-10-23 DIAGNOSIS — I4891 Unspecified atrial fibrillation: Secondary | ICD-10-CM | POA: Diagnosis not present

## 2024-10-23 DIAGNOSIS — L89892 Pressure ulcer of other site, stage 2: Secondary | ICD-10-CM | POA: Diagnosis not present

## 2024-10-26 DIAGNOSIS — L89893 Pressure ulcer of other site, stage 3: Secondary | ICD-10-CM | POA: Diagnosis not present

## 2024-11-09 ENCOUNTER — Ambulatory Visit: Payer: Self-pay | Admitting: Cardiology

## 2024-11-14 ENCOUNTER — Ambulatory Visit: Attending: Cardiology | Admitting: *Deleted

## 2024-11-14 DIAGNOSIS — I82603 Acute embolism and thrombosis of unspecified veins of upper extremity, bilateral: Secondary | ICD-10-CM | POA: Insufficient documentation

## 2024-11-14 DIAGNOSIS — Z9889 Other specified postprocedural states: Secondary | ICD-10-CM | POA: Insufficient documentation

## 2024-11-14 DIAGNOSIS — I9789 Other postprocedural complications and disorders of the circulatory system, not elsewhere classified: Secondary | ICD-10-CM | POA: Insufficient documentation

## 2024-11-14 DIAGNOSIS — I4891 Unspecified atrial fibrillation: Secondary | ICD-10-CM | POA: Diagnosis present

## 2024-11-14 DIAGNOSIS — Q2381 Bicuspid aortic valve: Secondary | ICD-10-CM | POA: Diagnosis present

## 2024-11-14 LAB — POCT INR: INR: 5.2 — AB (ref 2.0–3.0)

## 2024-11-14 NOTE — Progress Notes (Signed)
 INR 5.2; Please see anticoagulation encounter

## 2024-11-14 NOTE — Patient Instructions (Signed)
 Hold warfarin tonight and tomorrow night then resume 1 tablet daily except 1/2 tablet only on Sundays Recheck 2 wks

## 2024-11-20 ENCOUNTER — Other Ambulatory Visit: Payer: Self-pay

## 2024-11-20 ENCOUNTER — Telehealth: Payer: Self-pay | Admitting: Cardiology

## 2024-11-20 NOTE — Telephone Encounter (Signed)
 Patient will need antibiotics before dental cleaning

## 2024-11-20 NOTE — Telephone Encounter (Signed)
 Dental office would like to know if a pre-med is required before a dental cleaning.

## 2024-11-21 MED ORDER — POTASSIUM CHLORIDE CRYS ER 20 MEQ PO TBCR: 20.0000 meq | EXTENDED_RELEASE_TABLET | Freq: Two times a day (BID) | ORAL | 2 refills | Status: AC

## 2024-11-21 NOTE — Telephone Encounter (Signed)
 Dental office given pharmacist message regarding atb prophy before dental cleaning.

## 2024-11-28 ENCOUNTER — Ambulatory Visit: Attending: Cardiology | Admitting: *Deleted

## 2024-11-28 ENCOUNTER — Telehealth: Payer: Self-pay | Admitting: *Deleted

## 2024-11-28 DIAGNOSIS — I9789 Other postprocedural complications and disorders of the circulatory system, not elsewhere classified: Secondary | ICD-10-CM | POA: Insufficient documentation

## 2024-11-28 DIAGNOSIS — Z9889 Other specified postprocedural states: Secondary | ICD-10-CM | POA: Diagnosis present

## 2024-11-28 DIAGNOSIS — Q2381 Bicuspid aortic valve: Secondary | ICD-10-CM | POA: Diagnosis present

## 2024-11-28 DIAGNOSIS — I4891 Unspecified atrial fibrillation: Secondary | ICD-10-CM | POA: Insufficient documentation

## 2024-11-28 DIAGNOSIS — I82603 Acute embolism and thrombosis of unspecified veins of upper extremity, bilateral: Secondary | ICD-10-CM | POA: Diagnosis present

## 2024-11-28 LAB — POCT INR: INR: 2.9 (ref 2.0–3.0)

## 2024-11-28 NOTE — Telephone Encounter (Signed)
 Received referral from Vaughn Pouch, MD with [facility]~ [phone] telephone/ [phone]~ fax  Reason for referral: non- healing ulceration to left BKA  Reviewed referral with Dr. Mavis.   Dr. Harden, orthopedics performed bilateral BKA 12/11/2016. Patient last seen by orthopedics 03/17/2022.  Patient was under care of [facility], Dr. Maranda in 12/2023. Per notes, wound care was transitioned to home health care with Burnard Garner, NP with [facility] in [place]. Patient was last seen by [facility] wound care on 11/09/2024. Follow up appointment scheduled for 12/07/2024.  Per notes from Burnard Garner, NP on 11/09/2024 non-healing wound shows positive signs of healing, as evidenced by its reduced depth, well-granulated tissue, and lack of undermining or tunneling. The current size is 1.9 x 1.9 x 0.1 cm, showing a slight decrease compared to previous measurements. The patient's report of decreased pain is also a favorable sign. The small amount of bleeding noted is indicative of circulation to the wound bed.   No evidence of eschar or wound debridement required.   No surgical interventions required at this time. Referral denied.

## 2024-11-28 NOTE — Patient Instructions (Signed)
 Continue warfarin 2 tablets daily except 1 tablet on Sundays, Tuesdays and Thursdays Recheck 4 wks

## 2024-11-28 NOTE — Progress Notes (Signed)
 INR 2.9; Please see anticoagulation encounter

## 2024-12-16 ENCOUNTER — Other Ambulatory Visit: Payer: Self-pay | Admitting: Cardiology

## 2024-12-22 ENCOUNTER — Other Ambulatory Visit: Payer: Self-pay | Admitting: Cardiology

## 2024-12-26 ENCOUNTER — Ambulatory Visit: Admitting: *Deleted

## 2024-12-26 DIAGNOSIS — Z9889 Other specified postprocedural states: Secondary | ICD-10-CM

## 2024-12-26 DIAGNOSIS — I82603 Acute embolism and thrombosis of unspecified veins of upper extremity, bilateral: Secondary | ICD-10-CM

## 2024-12-26 DIAGNOSIS — I4891 Unspecified atrial fibrillation: Secondary | ICD-10-CM

## 2024-12-26 DIAGNOSIS — Q2381 Bicuspid aortic valve: Secondary | ICD-10-CM

## 2024-12-26 DIAGNOSIS — I9789 Other postprocedural complications and disorders of the circulatory system, not elsewhere classified: Secondary | ICD-10-CM

## 2024-12-26 LAB — POCT INR: INR: 7.2 — AB (ref 2.0–3.0)

## 2024-12-26 NOTE — Progress Notes (Signed)
INR - 7.2

## 2024-12-26 NOTE — Patient Instructions (Signed)
 Hold warfarin x 3 days then decrease dose to 1 tablet daily except 2 tablets on Mondays, Wednesdays and Fridays Recheck 1 wk

## 2025-01-03 ENCOUNTER — Ambulatory Visit

## 2025-02-08 ENCOUNTER — Ambulatory Visit: Admitting: Cardiology
